# Patient Record
Sex: Male | Born: 1945 | Race: White | Hispanic: No | Marital: Married | Smoking: Never smoker
Health system: Southern US, Community
[De-identification: ages and names within clinical notes are randomized; demographics above are authoritative.]

## PROBLEM LIST (undated history)

## (undated) ENCOUNTER — Emergency Department (HOSPITAL_BASED_OUTPATIENT_CLINIC_OR_DEPARTMENT_OTHER): Admission: EM | Payer: Medicare Other

## (undated) DIAGNOSIS — R011 Cardiac murmur, unspecified: Secondary | ICD-10-CM

## (undated) DIAGNOSIS — I10 Essential (primary) hypertension: Secondary | ICD-10-CM

## (undated) DIAGNOSIS — M1990 Unspecified osteoarthritis, unspecified site: Secondary | ICD-10-CM

## (undated) DIAGNOSIS — T7840XA Allergy, unspecified, initial encounter: Secondary | ICD-10-CM

## (undated) DIAGNOSIS — Z5189 Encounter for other specified aftercare: Secondary | ICD-10-CM

## (undated) DIAGNOSIS — E785 Hyperlipidemia, unspecified: Secondary | ICD-10-CM

## (undated) DIAGNOSIS — J302 Other seasonal allergic rhinitis: Secondary | ICD-10-CM

## (undated) DIAGNOSIS — K56609 Unspecified intestinal obstruction, unspecified as to partial versus complete obstruction: Secondary | ICD-10-CM

## (undated) DIAGNOSIS — D696 Thrombocytopenia, unspecified: Secondary | ICD-10-CM

## (undated) DIAGNOSIS — K469 Unspecified abdominal hernia without obstruction or gangrene: Secondary | ICD-10-CM

## (undated) DIAGNOSIS — I77810 Thoracic aortic ectasia: Secondary | ICD-10-CM

## (undated) DIAGNOSIS — C801 Malignant (primary) neoplasm, unspecified: Secondary | ICD-10-CM

## (undated) DIAGNOSIS — G629 Polyneuropathy, unspecified: Secondary | ICD-10-CM

## (undated) DIAGNOSIS — D649 Anemia, unspecified: Secondary | ICD-10-CM

## (undated) DIAGNOSIS — N182 Chronic kidney disease, stage 2 (mild): Secondary | ICD-10-CM

## (undated) DIAGNOSIS — K567 Ileus, unspecified: Secondary | ICD-10-CM

## (undated) HISTORY — DX: Anemia, unspecified: D64.9

## (undated) HISTORY — DX: Hyperlipidemia, unspecified: E78.5

## (undated) HISTORY — DX: Encounter for other specified aftercare: Z51.89

## (undated) HISTORY — PX: HERNIA REPAIR: SHX51

## (undated) HISTORY — DX: Allergy, unspecified, initial encounter: T78.40XA

## (undated) HISTORY — PX: COLOSTOMY: SHX63

## (undated) HISTORY — PX: TONSILLECTOMY: SUR1361

---

## 2000-12-31 ENCOUNTER — Encounter: Admission: RE | Admit: 2000-12-31 | Discharge: 2000-12-31 | Payer: Self-pay | Admitting: Nephrology

## 2000-12-31 ENCOUNTER — Encounter: Payer: Self-pay | Admitting: Nephrology

## 2008-05-05 DIAGNOSIS — M1990 Unspecified osteoarthritis, unspecified site: Secondary | ICD-10-CM

## 2008-05-05 HISTORY — DX: Unspecified osteoarthritis, unspecified site: M19.90

## 2012-06-02 DIAGNOSIS — D509 Iron deficiency anemia, unspecified: Secondary | ICD-10-CM | POA: Diagnosis not present

## 2012-06-02 DIAGNOSIS — E7800 Pure hypercholesterolemia, unspecified: Secondary | ICD-10-CM | POA: Diagnosis not present

## 2012-06-02 DIAGNOSIS — I10 Essential (primary) hypertension: Secondary | ICD-10-CM | POA: Diagnosis not present

## 2012-06-02 DIAGNOSIS — N182 Chronic kidney disease, stage 2 (mild): Secondary | ICD-10-CM | POA: Diagnosis not present

## 2012-12-23 DIAGNOSIS — D509 Iron deficiency anemia, unspecified: Secondary | ICD-10-CM | POA: Diagnosis not present

## 2012-12-23 DIAGNOSIS — I10 Essential (primary) hypertension: Secondary | ICD-10-CM | POA: Diagnosis not present

## 2012-12-23 DIAGNOSIS — E7800 Pure hypercholesterolemia, unspecified: Secondary | ICD-10-CM | POA: Diagnosis not present

## 2012-12-23 DIAGNOSIS — N182 Chronic kidney disease, stage 2 (mild): Secondary | ICD-10-CM | POA: Diagnosis not present

## 2012-12-30 DIAGNOSIS — N2581 Secondary hyperparathyroidism of renal origin: Secondary | ICD-10-CM | POA: Diagnosis not present

## 2012-12-30 DIAGNOSIS — I10 Essential (primary) hypertension: Secondary | ICD-10-CM | POA: Diagnosis not present

## 2012-12-30 DIAGNOSIS — D509 Iron deficiency anemia, unspecified: Secondary | ICD-10-CM | POA: Diagnosis not present

## 2013-03-15 DIAGNOSIS — D509 Iron deficiency anemia, unspecified: Secondary | ICD-10-CM | POA: Diagnosis not present

## 2013-03-15 DIAGNOSIS — E785 Hyperlipidemia, unspecified: Secondary | ICD-10-CM | POA: Diagnosis not present

## 2013-03-15 DIAGNOSIS — N2581 Secondary hyperparathyroidism of renal origin: Secondary | ICD-10-CM | POA: Diagnosis not present

## 2013-03-15 DIAGNOSIS — I10 Essential (primary) hypertension: Secondary | ICD-10-CM | POA: Diagnosis not present

## 2013-08-02 DIAGNOSIS — E785 Hyperlipidemia, unspecified: Secondary | ICD-10-CM | POA: Diagnosis not present

## 2013-08-02 DIAGNOSIS — E669 Obesity, unspecified: Secondary | ICD-10-CM | POA: Diagnosis not present

## 2013-08-02 DIAGNOSIS — N2581 Secondary hyperparathyroidism of renal origin: Secondary | ICD-10-CM | POA: Diagnosis not present

## 2013-08-02 DIAGNOSIS — I10 Essential (primary) hypertension: Secondary | ICD-10-CM | POA: Diagnosis not present

## 2013-08-02 DIAGNOSIS — N182 Chronic kidney disease, stage 2 (mild): Secondary | ICD-10-CM | POA: Diagnosis not present

## 2014-01-04 DIAGNOSIS — N182 Chronic kidney disease, stage 2 (mild): Secondary | ICD-10-CM | POA: Diagnosis not present

## 2014-01-04 DIAGNOSIS — E785 Hyperlipidemia, unspecified: Secondary | ICD-10-CM | POA: Diagnosis not present

## 2014-02-24 DIAGNOSIS — N2581 Secondary hyperparathyroidism of renal origin: Secondary | ICD-10-CM | POA: Diagnosis not present

## 2014-02-24 DIAGNOSIS — N182 Chronic kidney disease, stage 2 (mild): Secondary | ICD-10-CM | POA: Diagnosis not present

## 2014-02-28 DIAGNOSIS — E785 Hyperlipidemia, unspecified: Secondary | ICD-10-CM | POA: Diagnosis not present

## 2014-02-28 DIAGNOSIS — I10 Essential (primary) hypertension: Secondary | ICD-10-CM | POA: Diagnosis not present

## 2014-02-28 DIAGNOSIS — N182 Chronic kidney disease, stage 2 (mild): Secondary | ICD-10-CM | POA: Diagnosis not present

## 2014-02-28 DIAGNOSIS — N2581 Secondary hyperparathyroidism of renal origin: Secondary | ICD-10-CM | POA: Diagnosis not present

## 2014-08-02 DIAGNOSIS — E785 Hyperlipidemia, unspecified: Secondary | ICD-10-CM | POA: Diagnosis not present

## 2014-08-02 DIAGNOSIS — N182 Chronic kidney disease, stage 2 (mild): Secondary | ICD-10-CM | POA: Diagnosis not present

## 2014-09-13 DIAGNOSIS — E669 Obesity, unspecified: Secondary | ICD-10-CM | POA: Diagnosis not present

## 2014-09-13 DIAGNOSIS — I10 Essential (primary) hypertension: Secondary | ICD-10-CM | POA: Diagnosis not present

## 2014-09-13 DIAGNOSIS — N182 Chronic kidney disease, stage 2 (mild): Secondary | ICD-10-CM | POA: Diagnosis not present

## 2014-09-13 DIAGNOSIS — E785 Hyperlipidemia, unspecified: Secondary | ICD-10-CM | POA: Diagnosis not present

## 2014-10-18 DIAGNOSIS — E785 Hyperlipidemia, unspecified: Secondary | ICD-10-CM | POA: Diagnosis not present

## 2014-10-18 DIAGNOSIS — N182 Chronic kidney disease, stage 2 (mild): Secondary | ICD-10-CM | POA: Diagnosis not present

## 2015-05-02 DIAGNOSIS — E785 Hyperlipidemia, unspecified: Secondary | ICD-10-CM | POA: Diagnosis not present

## 2015-05-02 DIAGNOSIS — N182 Chronic kidney disease, stage 2 (mild): Secondary | ICD-10-CM | POA: Diagnosis not present

## 2015-06-13 DIAGNOSIS — N182 Chronic kidney disease, stage 2 (mild): Secondary | ICD-10-CM | POA: Diagnosis not present

## 2015-06-13 DIAGNOSIS — I10 Essential (primary) hypertension: Secondary | ICD-10-CM | POA: Diagnosis not present

## 2015-06-13 DIAGNOSIS — N2581 Secondary hyperparathyroidism of renal origin: Secondary | ICD-10-CM | POA: Diagnosis not present

## 2015-06-13 DIAGNOSIS — E669 Obesity, unspecified: Secondary | ICD-10-CM | POA: Diagnosis not present

## 2015-06-13 DIAGNOSIS — E785 Hyperlipidemia, unspecified: Secondary | ICD-10-CM | POA: Diagnosis not present

## 2015-12-27 DIAGNOSIS — N2581 Secondary hyperparathyroidism of renal origin: Secondary | ICD-10-CM | POA: Diagnosis not present

## 2015-12-27 DIAGNOSIS — I10 Essential (primary) hypertension: Secondary | ICD-10-CM | POA: Diagnosis not present

## 2015-12-27 DIAGNOSIS — E785 Hyperlipidemia, unspecified: Secondary | ICD-10-CM | POA: Diagnosis not present

## 2015-12-27 DIAGNOSIS — N182 Chronic kidney disease, stage 2 (mild): Secondary | ICD-10-CM | POA: Diagnosis not present

## 2015-12-27 DIAGNOSIS — E669 Obesity, unspecified: Secondary | ICD-10-CM | POA: Diagnosis not present

## 2016-03-07 DIAGNOSIS — E785 Hyperlipidemia, unspecified: Secondary | ICD-10-CM | POA: Diagnosis not present

## 2016-03-07 DIAGNOSIS — N2581 Secondary hyperparathyroidism of renal origin: Secondary | ICD-10-CM | POA: Diagnosis not present

## 2016-03-07 DIAGNOSIS — R197 Diarrhea, unspecified: Secondary | ICD-10-CM | POA: Diagnosis not present

## 2016-03-07 DIAGNOSIS — I10 Essential (primary) hypertension: Secondary | ICD-10-CM | POA: Diagnosis not present

## 2016-03-07 DIAGNOSIS — N182 Chronic kidney disease, stage 2 (mild): Secondary | ICD-10-CM | POA: Diagnosis not present

## 2016-03-07 DIAGNOSIS — E669 Obesity, unspecified: Secondary | ICD-10-CM | POA: Diagnosis not present

## 2016-03-07 DIAGNOSIS — R112 Nausea with vomiting, unspecified: Secondary | ICD-10-CM | POA: Diagnosis not present

## 2016-03-12 DIAGNOSIS — R112 Nausea with vomiting, unspecified: Secondary | ICD-10-CM | POA: Diagnosis not present

## 2016-03-12 DIAGNOSIS — N182 Chronic kidney disease, stage 2 (mild): Secondary | ICD-10-CM | POA: Diagnosis not present

## 2016-03-31 ENCOUNTER — Ambulatory Visit
Admission: RE | Admit: 2016-03-31 | Discharge: 2016-03-31 | Disposition: A | Discharge status: Home / Self Care | Payer: Medicare Other | Source: Ambulatory Visit | Attending: Nephrology | Admitting: Nephrology

## 2016-03-31 ENCOUNTER — Other Ambulatory Visit: Payer: Self-pay | Admitting: Nephrology

## 2016-03-31 DIAGNOSIS — R112 Nausea with vomiting, unspecified: Secondary | ICD-10-CM

## 2016-03-31 DIAGNOSIS — R197 Diarrhea, unspecified: Secondary | ICD-10-CM | POA: Diagnosis not present

## 2016-03-31 DIAGNOSIS — I10 Essential (primary) hypertension: Secondary | ICD-10-CM | POA: Diagnosis not present

## 2016-03-31 DIAGNOSIS — E785 Hyperlipidemia, unspecified: Secondary | ICD-10-CM | POA: Diagnosis not present

## 2016-03-31 DIAGNOSIS — N183 Chronic kidney disease, stage 3 (moderate): Secondary | ICD-10-CM | POA: Diagnosis not present

## 2016-03-31 DIAGNOSIS — R140 Abdominal distension (gaseous): Secondary | ICD-10-CM

## 2016-03-31 DIAGNOSIS — N179 Acute kidney failure, unspecified: Secondary | ICD-10-CM | POA: Diagnosis not present

## 2016-03-31 DIAGNOSIS — N2581 Secondary hyperparathyroidism of renal origin: Secondary | ICD-10-CM | POA: Diagnosis not present

## 2016-03-31 DIAGNOSIS — E669 Obesity, unspecified: Secondary | ICD-10-CM | POA: Diagnosis not present

## 2016-04-01 DIAGNOSIS — N179 Acute kidney failure, unspecified: Secondary | ICD-10-CM | POA: Diagnosis not present

## 2016-04-02 ENCOUNTER — Encounter (HOSPITAL_COMMUNITY): Payer: Self-pay | Admitting: *Deleted

## 2016-04-02 ENCOUNTER — Inpatient Hospital Stay (HOSPITAL_COMMUNITY)
Admission: EM | Admit: 2016-04-02 | Discharge: 2016-04-15 | DRG: 330 | Disposition: A | Discharge status: Home Health | Payer: Medicare Other | Attending: Internal Medicine | Admitting: Internal Medicine

## 2016-04-02 ENCOUNTER — Emergency Department (HOSPITAL_COMMUNITY): Payer: Medicare Other

## 2016-04-02 DIAGNOSIS — I700 Atherosclerosis of aorta: Secondary | ICD-10-CM | POA: Diagnosis present

## 2016-04-02 DIAGNOSIS — E778 Other disorders of glycoprotein metabolism: Secondary | ICD-10-CM | POA: Diagnosis not present

## 2016-04-02 DIAGNOSIS — N182 Chronic kidney disease, stage 2 (mild): Secondary | ICD-10-CM | POA: Diagnosis present

## 2016-04-02 DIAGNOSIS — Z8719 Personal history of other diseases of the digestive system: Secondary | ICD-10-CM

## 2016-04-02 DIAGNOSIS — R7303 Prediabetes: Secondary | ICD-10-CM | POA: Diagnosis present

## 2016-04-02 DIAGNOSIS — I129 Hypertensive chronic kidney disease with stage 1 through stage 4 chronic kidney disease, or unspecified chronic kidney disease: Secondary | ICD-10-CM | POA: Diagnosis not present

## 2016-04-02 DIAGNOSIS — E872 Acidosis: Secondary | ICD-10-CM | POA: Diagnosis present

## 2016-04-02 DIAGNOSIS — K567 Ileus, unspecified: Secondary | ICD-10-CM | POA: Diagnosis present

## 2016-04-02 DIAGNOSIS — C186 Malignant neoplasm of descending colon: Principal | ICD-10-CM | POA: Diagnosis present

## 2016-04-02 DIAGNOSIS — I10 Essential (primary) hypertension: Secondary | ICD-10-CM | POA: Diagnosis present

## 2016-04-02 DIAGNOSIS — K4090 Unilateral inguinal hernia, without obstruction or gangrene, not specified as recurrent: Secondary | ICD-10-CM | POA: Diagnosis present

## 2016-04-02 DIAGNOSIS — N189 Chronic kidney disease, unspecified: Secondary | ICD-10-CM

## 2016-04-02 DIAGNOSIS — K5909 Other constipation: Secondary | ICD-10-CM | POA: Diagnosis present

## 2016-04-02 DIAGNOSIS — I708 Atherosclerosis of other arteries: Secondary | ICD-10-CM | POA: Diagnosis present

## 2016-04-02 DIAGNOSIS — Z79899 Other long term (current) drug therapy: Secondary | ICD-10-CM

## 2016-04-02 DIAGNOSIS — R7301 Impaired fasting glucose: Secondary | ICD-10-CM | POA: Diagnosis present

## 2016-04-02 DIAGNOSIS — C189 Malignant neoplasm of colon, unspecified: Secondary | ICD-10-CM | POA: Diagnosis present

## 2016-04-02 DIAGNOSIS — K598 Other specified functional intestinal disorders: Secondary | ICD-10-CM | POA: Diagnosis not present

## 2016-04-02 DIAGNOSIS — K529 Noninfective gastroenteritis and colitis, unspecified: Secondary | ICD-10-CM | POA: Diagnosis present

## 2016-04-02 DIAGNOSIS — E876 Hypokalemia: Secondary | ICD-10-CM | POA: Diagnosis present

## 2016-04-02 DIAGNOSIS — N179 Acute kidney failure, unspecified: Secondary | ICD-10-CM | POA: Diagnosis present

## 2016-04-02 DIAGNOSIS — C187 Malignant neoplasm of sigmoid colon: Secondary | ICD-10-CM | POA: Diagnosis present

## 2016-04-02 DIAGNOSIS — K56609 Unspecified intestinal obstruction, unspecified as to partial versus complete obstruction: Secondary | ICD-10-CM

## 2016-04-02 DIAGNOSIS — Z8379 Family history of other diseases of the digestive system: Secondary | ICD-10-CM

## 2016-04-02 DIAGNOSIS — K640 First degree hemorrhoids: Secondary | ICD-10-CM | POA: Diagnosis present

## 2016-04-02 HISTORY — DX: Essential (primary) hypertension: I10

## 2016-04-02 HISTORY — DX: Chronic kidney disease, stage 2 (mild): N18.2

## 2016-04-02 HISTORY — DX: Unspecified abdominal hernia without obstruction or gangrene: K46.9

## 2016-04-02 LAB — COMPREHENSIVE METABOLIC PANEL
ALBUMIN: 3.4 g/dL — AB (ref 3.5–5.0)
ALK PHOS: 66 U/L (ref 38–126)
ALT: 13 U/L — AB (ref 17–63)
AST: 13 U/L — ABNORMAL LOW (ref 15–41)
Anion gap: 14 (ref 5–15)
BILIRUBIN TOTAL: 0.6 mg/dL (ref 0.3–1.2)
BUN: 62 mg/dL — AB (ref 6–20)
CALCIUM: 9 mg/dL (ref 8.9–10.3)
CO2: 19 mmol/L — ABNORMAL LOW (ref 22–32)
CREATININE: 3.5 mg/dL — AB (ref 0.61–1.24)
Chloride: 103 mmol/L (ref 101–111)
GFR calc Af Amer: 19 mL/min — ABNORMAL LOW (ref >60)
GFR calc non Af Amer: 16 mL/min — ABNORMAL LOW (ref >60)
GLUCOSE: 157 mg/dL — AB (ref 65–99)
POTASSIUM: 4.1 mmol/L (ref 3.5–5.1)
Sodium: 136 mmol/L (ref 135–145)
TOTAL PROTEIN: 7.2 g/dL (ref 6.5–8.1)

## 2016-04-02 LAB — URINALYSIS, ROUTINE W REFLEX MICROSCOPIC
BILIRUBIN URINE: NEGATIVE
Glucose, UA: NEGATIVE mg/dL
Hgb urine dipstick: NEGATIVE
KETONES UR: NEGATIVE mg/dL
NITRITE: NEGATIVE
PH: 5 (ref 5.0–8.0)
Protein, ur: 30 mg/dL — AB
Specific Gravity, Urine: 1.017 (ref 1.005–1.030)

## 2016-04-02 LAB — URINE MICROSCOPIC-ADD ON: RBC / HPF: NONE SEEN RBC/hpf (ref 0–5)

## 2016-04-02 LAB — PHOSPHORUS: PHOSPHORUS: 4.4 mg/dL (ref 2.5–4.6)

## 2016-04-02 LAB — CBC
HEMATOCRIT: 40.6 % (ref 39.0–52.0)
Hemoglobin: 13.8 g/dL (ref 13.0–17.0)
MCH: 28.5 pg (ref 26.0–34.0)
MCHC: 34 g/dL (ref 30.0–36.0)
MCV: 83.9 fL (ref 78.0–100.0)
Platelets: 230 10*3/uL (ref 150–400)
RBC: 4.84 MIL/uL (ref 4.22–5.81)
RDW: 15.1 % (ref 11.5–15.5)
WBC: 5.1 10*3/uL (ref 4.0–10.5)

## 2016-04-02 LAB — LACTIC ACID, PLASMA: LACTIC ACID, VENOUS: 1.2 mmol/L (ref 0.5–1.9)

## 2016-04-02 LAB — LIPASE, BLOOD: Lipase: 19 U/L (ref 11–51)

## 2016-04-02 LAB — MAGNESIUM: MAGNESIUM: 1.9 mg/dL (ref 1.7–2.4)

## 2016-04-02 MED ORDER — MORPHINE SULFATE (PF) 4 MG/ML IV SOLN: 4.0000 mg | Freq: Once | INTRAVENOUS | Status: DC

## 2016-04-02 MED ORDER — ONDANSETRON HCL 4 MG/2ML IJ SOLN
4.0000 mg | Freq: Four times a day (QID) | INTRAMUSCULAR | Status: DC | PRN
  Filled 2016-04-02 (×3): qty 2

## 2016-04-02 MED ORDER — MORPHINE SULFATE (PF) 4 MG/ML IV SOLN
4.0000 mg | Freq: Once | INTRAVENOUS | Status: AC
Start: 2016-04-02 — End: 2016-04-02
  Administered 2016-04-02: 4 mg via INTRAVENOUS
  Filled 2016-04-02: qty 1

## 2016-04-02 MED ORDER — SODIUM CHLORIDE 0.9 % IV SOLN
INTRAVENOUS | Status: DC
  Administered 2016-04-02 – 2016-04-03 (×3): via INTRAVENOUS

## 2016-04-02 MED ORDER — ONDANSETRON HCL 4 MG/2ML IJ SOLN
4.0000 mg | Freq: Once | INTRAMUSCULAR | Status: AC
  Administered 2016-04-02: 4 mg via INTRAVENOUS
  Filled 2016-04-02: qty 2

## 2016-04-02 MED ORDER — ONDANSETRON HCL 4 MG PO TABS: 4.0000 mg | ORAL_TABLET | Freq: Four times a day (QID) | ORAL | Status: DC | PRN

## 2016-04-02 MED ORDER — ENOXAPARIN SODIUM 30 MG/0.3ML ~~LOC~~ SOLN
30.0000 mg | SUBCUTANEOUS | Status: DC
Start: 2016-04-02 — End: 2016-04-02

## 2016-04-02 MED ORDER — SODIUM CHLORIDE 0.9 % IV BOLUS (SEPSIS)
1000.0000 mL | Freq: Once | INTRAVENOUS | Status: AC
  Administered 2016-04-02: 1000 mL via INTRAVENOUS

## 2016-04-02 MED ORDER — MORPHINE SULFATE (PF) 4 MG/ML IV SOLN
2.0000 mg | INTRAVENOUS | Status: DC | PRN
  Administered 2016-04-02: 2 mg via INTRAVENOUS
  Filled 2016-04-02: qty 1

## 2016-04-02 NOTE — H&P (Signed)
Date: 04/02/2016               Patient Name:  Louis [name] MRN: [id number]  DOB: 25-Dec-1945 Age / Sex: 70 y.o., male   PCP: No primary care provider on file.         Medical Service: Internal Medicine Teaching Service         Attending Physician: Dr. Axel Filler, MD    First Contact: Dr. Inda Castle Pager: [phone]  Second Contact: Dr. Benjamine Mola Pager: [phone]       After Hours (After 5p/  First Contact Pager: [phone]  weekends / holidays): Second Contact Pager: [phone]   Chief Complaint: Nausea/vomiting, abdominal pain  History of Present Illness: 70 year old man with hypertension, CKD2, inguinal hernia presenting with abdominal pain for the last 5 days. 4 weeks ago, he went to a festival and had some fries. A week later he developed, nausea, vomiting, abdominal pain and diarrhea. He was seen by his nephrologist. He was prescribed a 5 day course of antibiotics. A week later he had another episode. This episode started this past weekend with nausea and vomiting. He vomited 2 times in one day. Nonbloody and nonbilious. He developed abdominal pain and diarrhea soon after. He had 2-3 BM on Sunday - watery without hematochezia or melena. No BM since then. Denies flatus. Abdominal pain is most in the epigastric region. He has intermittent episodes of bloating with the pain. Pain does not radiates. Pain is cramping in nature. Sitting up relieves the pain. Morphine also helps. No exacerbating factors. He is having a lot of burping. No postprandial meals and no correlation with time of day. No sick contacts. No fevers or chills. No previous similar episodes. He had an inguinal hernia repair as a child. 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 He reports the hernia is easily reducible. No recent antibiotics before course he was given by his nephrologist.   He has not had a colonscopy and has never had an EGD.   No vision  changes, cough, shortness of breath, chest pain, dysuria or hematuria. No edema. No new arthralgias, myalgias.  Meds:  Current Meds  Medication Sig  . labetalol (NORMODYNE) 300 MG tablet Take 300 mg by mouth 2 (two) times daily.  Marland Kitchen loperamide (IMODIUM) 2 MG capsule Take 2-4 mg by mouth daily as needed.  Marland Kitchen omega-3 acid ethyl esters (LOVAZA) 1 g capsule Take 1 capsule by mouth daily.  . promethazine (PHENERGAN) 25 MG tablet Take 25 mg by mouth every 6 (six) hours as needed for nausea or vomiting.      Allergies: Allergies as of 04/02/2016  . (No Known Allergies)   Past Medical History:  Diagnosis Date  . Hernia, abdominal   . Hypertension     Family History:  Brother with diverticulosis. Father had PUD. No family history of colon cancer.  Social History:  Quit cigars 30 years ago. Occasional alcohol. A few glasses of wine a few times a week. No illicits. 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  Review of Systems: A complete ROS was negative except as per HPI.   Physical Exam: Blood pressure 130/82, pulse 92, temperature 98.4 F (36.9 C), temperature source Oral, resp. rate 21, height 6\' 5"  (1.956 m), weight 234 lb (106.1 kg), SpO2 96 %. General Apperance: NAD Head: Normocephalic, atraumatic Eyes: PERRL, EOMI, anicteric sclera Ears: Normal external ear canal Nose: Nares  normal, septum midline, mucosa normal Throat: Lips, mucosa and tongue normal  Neck: Supple, trachea midline Back: No tenderness or bony abnormality  Lungs: Clear to auscultation bilaterally. No wheezes, rhonchi or rales. Breathing comfortably Chest Wall: Nontender, no deformity Heart: Regular rate and rhythm, no murmur/rub/gallop Abdomen: Soft, minimally tender to palpation throughout, distended and tympanic to percussion, no rebound/guarding Extremities: Normal, atraumatic, warm and well perfused, no edema Pulses: 2+ throughout Skin: No rashes or lesions Neurologic: Alert and oriented x 3. CNII-XII intact.  Normal strength and sensation   AXR 11/27: Mildly prominent loop of small bowel in the left mid abdomen with possible valvular/wall thickening.  CT abdomen/pelvis wo contrast:  Multiple loops of dilated small and large bowel with fluid throughout much of the bowel. A well-defined transition zone is not seen. There is no appreciable bowel wall thickening. No bowel pneumatosis evident. The appearance is consistent with ileus, likely with associated enterocolitis.  There is a right inguinal hernia which contains a portion of the urinary bladder. There is wall thickening in much of the urinary bladder suggesting a degree of cystitis.  Prostate appears prominent and cannot be separated from the inferior bladder. This finding warrants direct physical examination and correlation with PSA. Prostate carcinoma in this circumstance cannot be excluded by CT.  There is aortoiliac atherosclerosis. There are foci of coronary artery calcification.  Gallbladder appears somewhat distended without wall thickening.  Assessment & Plan by Problem: 70 year old man with hypertension, CKD2, inguinal hernia presenting with abdominal pain for the last 5 days.   Ileus: He is afebrile with no leukocytosis. CT abd/pelvis today with multiple loops of dilated small and large bowel with no well defined transition point. Some wall thickening of the urinary bladder but UA with negative nitrite, trace leukocytes, 0-5 WBC, 6-30 squamous epithelial cells. Unclear etiology of his ileus. He received 1L NS bolus,  -check magnesium and phos -NPO -Normal saline @ 135ml/hr -morphine 2mg  q 4hr prn pain -Zofran 4mg  q6hr prn nausea/vomiting  Metabolic acidosis: CO2 19 and anion gap of 14. No previous labs for comparison. This may be related to his renal disease. Will check a lactic acid.  AKI on CKD2: Unknown baseline. BUN 62 and Cr 3.5 on admission. Likely pre-renal in etiology due to nausea/vomiting and poor PO  intake.  Hypertension: Hold home labetalol and amlodipine for now.  FEN: NPO, NS@150ml /hr VTE ppx: Lovenox Code status: FULL  Dispo: Admit patient to Observation with expected length of stay less than 2 midnights.  Signed: Milagros Loll, MD 04/02/2016, 11:41 AM  Pager: [phone]

## 2016-04-02 NOTE — ED Triage Notes (Signed)
Pt reports abd cramping, n/v/d. Had similar episode two weeks ago.

## 2016-04-02 NOTE — ED Notes (Signed)
Pt given ice chips per [place], Therapist, sports.

## 2016-04-02 NOTE — ED Provider Notes (Signed)
[facility] DEPT Provider Note   CSN: [id number] Arrival date & time: 04/02/16  1740     History   Chief Complaint Chief Complaint  Patient presents with  . Abdominal Pain  . Emesis  . Diarrhea    HPI Louis [name] is a 70 y.o. male.  HPI   Patient is a 70 year old male with history of hypertension, CKD 2/2 HTN and abdominal hernia who presents the ED with complaint of abdominal pain, onset 5 days. Patient reports having intermittent diffuse abdominal cramping/tightness. Denies any aggravating or alleviating factors. He reports having associated nausea and multiple episodes of NBNB vomiting and nonbloody diarrhea over the weekend which has since resolved; however patient endorses having continued abdominal pain. Endorses associated abdominal distention, decreased flatulence, constipation( last BM 3 days ago) and chills. He reports having similar episode of symptoms 2-3 weeks ago. He states he was evaluated by his primary care provider (nephrologist), had an abdominal xray performed which was negative and was prescribed antibiotics and states his symptoms had completely resolved until he returned this past week. He states he was seen by his PCP 2 days ago and was given referral to GI for further evaluation. Patient reports his abdominal pain worsened resulting in him coming to the ED. Reports he has not been eating much and has had decreased fluid intake due to not feeling well. Denies fever, body aches, cough, shortness of breath, chest pain, urinary symptoms. Denies any known sick contacts. Denies any recent hospitalizations. Reports history of hernia repair when he was 70 years old.  Nephrologist- Dr. Marval Regal  Chart review shows KUB performed on 11/17 with "Mildly prominent loop of small bowel in the left mid abdomen with possible valvular/wall thickening".  Past Medical History:  Diagnosis Date  . Hernia, abdominal   . Hypertension     Patient Active Problem List   Diagnosis Date Noted  . Ileus ([place]) 04/02/2016    History reviewed. No pertinent surgical history.     Home Medications    Prior to Admission medications   Medication Sig Start Date End Date Taking? Authorizing Provider  labetalol (NORMODYNE) 300 MG tablet Take 300 mg by mouth 2 (two) times daily. 03/02/16  Yes Historical Provider, MD  loperamide (IMODIUM) 2 MG capsule Take 2-4 mg by mouth daily as needed. 03/07/16  Yes Historical Provider, MD  omega-3 acid ethyl esters (LOVAZA) 1 g capsule Take 1 capsule by mouth daily. 03/22/16  Yes Historical Provider, MD  promethazine (PHENERGAN) 25 MG tablet Take 25 mg by mouth every 6 (six) hours as needed for nausea or vomiting.  03/31/16  Yes Historical Provider, MD  amLODipine (NORVASC) 10 MG tablet Take 10 mg by mouth daily. 02/11/16   Historical Provider, MD    Family History History reviewed. No pertinent family history.  Social History Social History  Substance Use Topics  . Smoking status: Never Smoker  . Smokeless tobacco: Not on file  . Alcohol use Yes     Comment: occ     Allergies   Patient has no known allergies.   Review of Systems Review of Systems  Constitutional: Positive for chills.  Gastrointestinal: Positive for abdominal distention, abdominal pain, diarrhea, nausea and vomiting.  All other systems reviewed and are negative.    Physical Exam Updated Vital Signs BP 130/82   Pulse 92   Temp 98.4 F (36.9 C) (Oral)   Resp 21   Ht 6\' 5"  (1.956 m)   Wt 106.1 kg   SpO2 96%  BMI 27.75 kg/m   Physical Exam  Constitutional: He is oriented to person, place, and time. He appears well-developed and well-nourished.  HENT:  Head: Normocephalic and atraumatic.  Mouth/Throat: Uvula is midline, oropharynx is clear and moist and mucous membranes are normal. No oropharyngeal exudate, posterior oropharyngeal edema, posterior oropharyngeal erythema or tonsillar abscesses. No tonsillar exudate.  Eyes: Conjunctivae and  EOM are normal. Right eye exhibits no discharge. Left eye exhibits no discharge. No scleral icterus.  Neck: Normal range of motion. Neck supple.  Cardiovascular: Normal rate, regular rhythm, normal heart sounds and intact distal pulses.   Pulmonary/Chest: Effort normal and breath sounds normal. No respiratory distress. He has no wheezes. He has no rales. He exhibits no tenderness.  Abdominal: Soft. Bowel sounds are normal. He exhibits distension. He exhibits no mass. There is tenderness (mild diffuse tenderness throughout). There is no rebound and no guarding. No hernia.  No CVA tenderness  Musculoskeletal: Normal range of motion. He exhibits no edema.  Neurological: He is alert and oriented to person, place, and time.  Skin: Skin is warm and dry.  Nursing note and vitals reviewed.    ED Treatments / Results  Labs (all labs ordered are listed, but only abnormal results are displayed) Labs Reviewed  COMPREHENSIVE METABOLIC PANEL - Abnormal; Notable for the following:       Result Value   CO2 19 (*)    Glucose, Bld 157 (*)    BUN 62 (*)    Creatinine, Ser 3.50 (*)    Albumin 3.4 (*)    AST 13 (*)    ALT 13 (*)    GFR calc non Af Amer 16 (*)    GFR calc Af Amer 19 (*)    All other components within normal limits  URINALYSIS, ROUTINE W REFLEX MICROSCOPIC (NOT AT [facility]) - Abnormal; Notable for the following:    Protein, ur 30 (*)    Leukocytes, UA TRACE (*)    All other components within normal limits  URINE MICROSCOPIC-ADD ON - Abnormal; Notable for the following:    Squamous Epithelial / LPF 6-30 (*)    Bacteria, UA FEW (*)    Casts GRANULAR CAST (*)    All other components within normal limits  LIPASE, BLOOD  CBC    EKG  EKG Interpretation None       Radiology Ct Abdomen Pelvis Wo Contrast  Result Date: 04/02/2016 CLINICAL DATA:  Two week history of abdominal distention with nausea, vomiting common diarrhea EXAM: CT ABDOMEN AND PELVIS WITHOUT CONTRAST TECHNIQUE:  Multidetector CT imaging of the abdomen and pelvis was performed following the standard protocol without IV contrast. Oral contrast was administered. COMPARISON:  None. FINDINGS: Lower chest: There is patchy bibasilar atelectasis. There are scattered foci of coronary artery calcification. Hepatobiliary: No focal liver lesions are evident on this non intravenous contrast enhanced study. The gallbladder appears distended without appreciable gallbladder wall thickening. There is no biliary duct dilatation. Pancreas: There is no pancreatic mass or inflammatory focus. Spleen: No splenic lesions are evident. Adrenals/Urinary Tract: Adrenals appear normal bilaterally. There is a cyst arising from the lower pole of the left kidney measuring 7.0 x 6.1 cm. There is a cyst arising from the posterior mid right kidney measuring 2.3 x 2.2 cm. There is an immediately adjacent 1 x 1 cm cyst in the right kidney in this region. There is no renal hydronephrosis on either side. There is no renal or ureteral calculus on either side. Urinary bladder is midline  with a portion of the urinary bladder extending into a right inguinal hernia. Portions of the urinary bladder have a thickened wall. Stomach/Bowel: The stomach is nondistended. The proximal small bowel is nondistended. There is dilatation of small bowel from the level of the proximal to mid jejunum throughout its course. There is distention of most of the colon. There is a significant amount of fluid throughout the colon as well as much of the distal small bowel. A well-defined transition zone suggesting a focal site of small or large bowel obstruction is not appreciated. There is no bowel wall or mesenteric thickening. There is no free air or portal venous air. Vascular/Lymphatic: There is atherosclerotic calcification in the aorta and common iliac arteries. There is no demonstrable abdominal aortic aneurysm. Major mesenteric arterial vessels appear patent on this noncontrast  enhanced study. There is no appreciable adenopathy in the abdomen or pelvis. Reproductive: The prostate appears mildly enlarged and is difficult to separate from the inferior urinary bladder. There are several prostatic calculi. The seminal vesicles appear unremarkable. No pelvic mass. Other: Appendix appears unremarkable without inflammation. No abscess or ascites is evident in the abdomen or pelvis. There is a right inguinal hernia which contains a portion of a prolapsed urinary bladder with wall thickening in the bladder. There is fat in the left inguinal ring. There is a minimal ventral hernia containing only fat. Musculoskeletal: There is degenerative change in the lumbar spine. There is moderate spinal stenosis at L3-4 due to bony hypertrophy and diffuse disc protrusion. There are no blastic or lytic bone lesions. There is no intramuscular or abdominal wall lesion. IMPRESSION: Multiple loops of dilated small and large bowel with fluid throughout much of the bowel. A well-defined transition zone is not seen. There is no appreciable bowel wall thickening. No bowel pneumatosis evident. The appearance is consistent with ileus, likely with associated enterocolitis. There is a right inguinal hernia which contains a portion of the urinary bladder. There is wall thickening in much of the urinary bladder suggesting a degree of cystitis. Prostate appears prominent and cannot be separated from the inferior bladder. This finding warrants direct physical examination and correlation with PSA. Prostate carcinoma in this circumstance cannot be excluded by CT. There is aortoiliac atherosclerosis. There are foci of coronary artery calcification. Gallbladder appears somewhat distended without wall thickening. Renal cysts bilaterally. Largest cyst arises from the left kidney measuring 7.0 x 6.1 cm. 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 Electronically Signed   By: Lowella Grip III M.D.   On: 04/02/2016 11:08    Dg Abd 1 View  Result Date: 03/31/2016 CLINICAL DATA:  Diarrhea, nausea, and vomiting. Distended abdomen with pain for 2 weeks. EXAM: ABDOMEN - 1 VIEW COMPARISON:  None. FINDINGS: Air-filled loop of bowel in the mid abdomen is thought to be colon with a small amount of air in the ascending colon and rectum. Mildly prominent loops of small bowel in the left abdomen with suggested valvular thickening, measuring up to 3.2 cm in caliber. Phleboliths in the pelvis. No free air, portal venous gas, or pneumatosis. IMPRESSION: Mildly prominent loop of small bowel in the left mid abdomen with possible valvular/wall thickening. A CT scan could better evaluate as clinically warranted. These results will be called to the ordering clinician or representative by the Radiologist Assistant, and communication documented in the PACS or zVision Dashboard. Electronically Signed   By: Dorise Bullion III M.D   On: 03/31/2016 12:12    Procedures Procedures (including critical care time)  Medications Ordered in ED Medications  morphine 4 MG/ML injection 4 mg (not administered)  sodium chloride 0.9 % bolus 1,000 mL (0 mLs Intravenous Stopped 04/02/16 0944)  morphine 4 MG/ML injection 4 mg (4 mg Intravenous Given 04/02/16 0819)  ondansetron (ZOFRAN) injection 4 mg (4 mg Intravenous Given 04/02/16 0818)     Initial Impression / Assessment and Plan / ED Course  I have reviewed the triage vital signs and the nursing notes.  Pertinent labs & imaging results that were available during my care of the patient were reviewed by me and considered in my medical decision making (see chart for details).  Clinical Course     Patient presents with intermittent abdominal pain for the past 2-3 weeks with associated nausea, vomiting and diarrhea. He reports initially been seen by his nephrologist (who he states is his PCP) 2 weeks ago and was given antibiotics. Denies fever. VSS. Exam revealed mild diffuse abdominal tenderness,  no peritoneal signs. Remaining exam unremarkable. Patient given IV fluids, pain meds and antiemetics.   Labs revealed creatinine 3.5, BUN 62. Remaining labs unremarkable. I spoke with nurse at pt's nephrology clinic who faxed me pt's records from recent visit on 11/27. Labs performed on 11/27 showed Cr 3.32, BUN 45. CT abdomen revealed ileus. Due to patient with continued symptoms with outpatient treatment, plan to admit patient for further management. Discussed results and plan for admission with patient and family. Consult at hospitalist. Orders placed for obs med-surg bed.  Final Clinical Impressions(s) / ED Diagnoses   Final diagnoses:  Ileus [facility])    New Prescriptions New Prescriptions   No medications on file     Nona Dell, [place] 04/02/16 1139    Isla Pence, MD 04/02/16 1208

## 2016-04-02 NOTE — ED Notes (Signed)
Report attempted 

## 2016-04-02 NOTE — ED Notes (Signed)
Pt. Denies mjorphine ordered PA aware. Will continue to monitor.

## 2016-04-02 NOTE — ED Notes (Signed)
Pt transporting to CT  

## 2016-04-02 NOTE — ED Notes (Signed)
Pt had unhook o2 sensor on accident. This nurse placed pt back on O2 monitor. O2 sats 94% on room air. Pt continues to deny need for IV morphine that PA ordered. Will continue to monitor.

## 2016-04-02 NOTE — ED Notes (Signed)
Pt ambulated to RR .  

## 2016-04-02 NOTE — ED Notes (Signed)
Patient transported to CT 

## 2016-04-03 DIAGNOSIS — E872 Acidosis: Secondary | ICD-10-CM | POA: Diagnosis not present

## 2016-04-03 DIAGNOSIS — C786 Secondary malignant neoplasm of retroperitoneum and peritoneum: Secondary | ICD-10-CM | POA: Diagnosis not present

## 2016-04-03 DIAGNOSIS — K567 Ileus, unspecified: Secondary | ICD-10-CM | POA: Diagnosis not present

## 2016-04-03 DIAGNOSIS — K56699 Other intestinal obstruction unspecified as to partial versus complete obstruction: Secondary | ICD-10-CM | POA: Diagnosis not present

## 2016-04-03 DIAGNOSIS — R140 Abdominal distension (gaseous): Secondary | ICD-10-CM | POA: Diagnosis not present

## 2016-04-03 DIAGNOSIS — K56600 Partial intestinal obstruction, unspecified as to cause: Secondary | ICD-10-CM | POA: Diagnosis not present

## 2016-04-03 DIAGNOSIS — C186 Malignant neoplasm of descending colon: Secondary | ICD-10-CM | POA: Diagnosis not present

## 2016-04-03 DIAGNOSIS — K5901 Slow transit constipation: Secondary | ICD-10-CM | POA: Diagnosis not present

## 2016-04-03 DIAGNOSIS — C772 Secondary and unspecified malignant neoplasm of intra-abdominal lymph nodes: Secondary | ICD-10-CM | POA: Diagnosis not present

## 2016-04-03 DIAGNOSIS — R933 Abnormal findings on diagnostic imaging of other parts of digestive tract: Secondary | ICD-10-CM | POA: Diagnosis not present

## 2016-04-03 DIAGNOSIS — N179 Acute kidney failure, unspecified: Secondary | ICD-10-CM | POA: Diagnosis not present

## 2016-04-03 DIAGNOSIS — K640 First degree hemorrhoids: Secondary | ICD-10-CM | POA: Diagnosis not present

## 2016-04-03 DIAGNOSIS — N182 Chronic kidney disease, stage 2 (mild): Secondary | ICD-10-CM | POA: Diagnosis not present

## 2016-04-03 DIAGNOSIS — R7303 Prediabetes: Secondary | ICD-10-CM | POA: Diagnosis present

## 2016-04-03 DIAGNOSIS — C187 Malignant neoplasm of sigmoid colon: Secondary | ICD-10-CM | POA: Diagnosis not present

## 2016-04-03 DIAGNOSIS — Z79899 Other long term (current) drug therapy: Secondary | ICD-10-CM | POA: Diagnosis not present

## 2016-04-03 DIAGNOSIS — I700 Atherosclerosis of aorta: Secondary | ICD-10-CM | POA: Diagnosis not present

## 2016-04-03 DIAGNOSIS — I129 Hypertensive chronic kidney disease with stage 1 through stage 4 chronic kidney disease, or unspecified chronic kidney disease: Secondary | ICD-10-CM | POA: Diagnosis not present

## 2016-04-03 DIAGNOSIS — K56609 Unspecified intestinal obstruction, unspecified as to partial versus complete obstruction: Secondary | ICD-10-CM | POA: Diagnosis not present

## 2016-04-03 DIAGNOSIS — E876 Hypokalemia: Secondary | ICD-10-CM | POA: Diagnosis not present

## 2016-04-03 DIAGNOSIS — I10 Essential (primary) hypertension: Secondary | ICD-10-CM | POA: Diagnosis not present

## 2016-04-03 DIAGNOSIS — K439 Ventral hernia without obstruction or gangrene: Secondary | ICD-10-CM | POA: Diagnosis not present

## 2016-04-03 DIAGNOSIS — K5909 Other constipation: Secondary | ICD-10-CM | POA: Diagnosis not present

## 2016-04-03 DIAGNOSIS — C189 Malignant neoplasm of colon, unspecified: Secondary | ICD-10-CM | POA: Diagnosis not present

## 2016-04-03 DIAGNOSIS — E778 Other disorders of glycoprotein metabolism: Secondary | ICD-10-CM | POA: Diagnosis not present

## 2016-04-03 DIAGNOSIS — R935 Abnormal findings on diagnostic imaging of other abdominal regions, including retroperitoneum: Secondary | ICD-10-CM | POA: Diagnosis not present

## 2016-04-03 DIAGNOSIS — K4090 Unilateral inguinal hernia, without obstruction or gangrene, not specified as recurrent: Secondary | ICD-10-CM | POA: Diagnosis not present

## 2016-04-03 DIAGNOSIS — N189 Chronic kidney disease, unspecified: Secondary | ICD-10-CM | POA: Diagnosis not present

## 2016-04-03 DIAGNOSIS — I708 Atherosclerosis of other arteries: Secondary | ICD-10-CM | POA: Diagnosis present

## 2016-04-03 DIAGNOSIS — D490 Neoplasm of unspecified behavior of digestive system: Secondary | ICD-10-CM | POA: Diagnosis not present

## 2016-04-03 LAB — BASIC METABOLIC PANEL
ANION GAP: 9 (ref 5–15)
BUN: 47 mg/dL — AB (ref 6–20)
CALCIUM: 8.5 mg/dL — AB (ref 8.9–10.3)
CO2: 20 mmol/L — ABNORMAL LOW (ref 22–32)
Chloride: 109 mmol/L (ref 101–111)
Creatinine, Ser: 2.59 mg/dL — ABNORMAL HIGH (ref 0.61–1.24)
GFR calc Af Amer: 27 mL/min — ABNORMAL LOW (ref >60)
GFR, EST NON AFRICAN AMERICAN: 24 mL/min — AB (ref >60)
Glucose, Bld: 106 mg/dL — ABNORMAL HIGH (ref 65–99)
POTASSIUM: 3.8 mmol/L (ref 3.5–5.1)
SODIUM: 138 mmol/L (ref 135–145)

## 2016-04-03 LAB — CREATININE, SERUM
CREATININE: 2.46 mg/dL — AB (ref 0.61–1.24)
GFR calc Af Amer: 29 mL/min — ABNORMAL LOW (ref >60)
GFR calc non Af Amer: 25 mL/min — ABNORMAL LOW (ref >60)

## 2016-04-03 LAB — CBC
HCT: 36.1 % — ABNORMAL LOW (ref 39.0–52.0)
Hemoglobin: 11.8 g/dL — ABNORMAL LOW (ref 13.0–17.0)
MCH: 27.9 pg (ref 26.0–34.0)
MCHC: 32.7 g/dL (ref 30.0–36.0)
MCV: 85.3 fL (ref 78.0–100.0)
PLATELETS: 189 10*3/uL (ref 150–400)
RBC: 4.23 MIL/uL (ref 4.22–5.81)
RDW: 15.2 % (ref 11.5–15.5)
WBC: 5.3 10*3/uL (ref 4.0–10.5)

## 2016-04-03 LAB — PHOSPHORUS: Phosphorus: 2.8 mg/dL (ref 2.5–4.6)

## 2016-04-03 LAB — MAGNESIUM: MAGNESIUM: 2 mg/dL (ref 1.7–2.4)

## 2016-04-03 MED ORDER — ENOXAPARIN SODIUM 40 MG/0.4ML ~~LOC~~ SOLN
40.0000 mg | SUBCUTANEOUS | Status: DC
  Administered 2016-04-03 – 2016-04-06 (×4): 40 mg via SUBCUTANEOUS
  Filled 2016-04-03 (×5): qty 0.4

## 2016-04-03 MED ORDER — DEXTROSE-NACL 5-0.45 % IV SOLN
INTRAVENOUS | Status: DC
  Administered 2016-04-03 – 2016-04-07 (×11): via INTRAVENOUS

## 2016-04-03 NOTE — Progress Notes (Signed)
   Subjective: 4-5 liquid bowel movements overnight, no flatus.  No nausea, moderate appetite, would like to try and eat.  Objective:  Vital signs in last 24 hours: Vitals:   04/02/16 1720 04/02/16 2134 04/03/16 0427 04/03/16 1305  BP:  (!) 128/55 (!) 145/77 (!) 155/86  Pulse:  84 91 84  Resp:  19 18 18   Temp:  98.3 F (36.8 C) 98.6 F (37 C) 98.4 F (36.9 C)  TempSrc:  Oral Axillary Oral  SpO2:  97% 95% 99%  Weight: 234 lb (106.1 kg)     Height: 6\' 5"  (1.956 m)      Physical Exam  Constitutional: He is oriented to person, place, and time. He appears well-developed and well-nourished. No distress.  Cardiovascular: Normal rate and regular rhythm.   Pulmonary/Chest: Effort normal and breath sounds normal.  Abdominal:  Tympanic, grossly distended, hyperactive bowel sounds Nontender  Genitourinary:  Genitourinary Comments: Prostate slightly enlarged, homogenous, nontender Scant brown liquid stool in rectum  Neurological: He is alert and oriented to person, place, and time.  Psychiatric: He has a normal mood and affect. His behavior is normal.   CBC Latest Ref Rng & Units 04/02/2016  WBC 4.0 - 10.5 K/uL 5.1  Hemoglobin 13.0 - 17.0 g/dL 13.8  Hematocrit 39.0 - 52.0 % 40.6  Platelets 150 - 400 K/uL 230   BMP Latest Ref Rng & Units 04/03/2016 04/02/2016  Glucose 65 - 99 mg/dL 106(H) 157(H)  BUN 6 - 20 mg/dL 47(H) 62(H)  Creatinine 0.61 - 1.24 mg/dL 2.59(H) 3.50(H)  Sodium 135 - 145 mmol/L 138 136  Potassium 3.5 - 5.1 mmol/L 3.8 4.1  Chloride 101 - 111 mmol/L 109 103  CO2 22 - 32 mmol/L 20(L) 19(L)  Calcium 8.9 - 10.3 mg/dL 8.5(L) 9.0   Lactic Acid, Venous    Component Value Date/Time   LATICACIDVEN 1.2 04/02/2016 2570   70 year old man with distended, tympanic abdomen and imaging finding consistent with ileus.  He had symptoms consistent gastroenteritis which preceded his ileus and may have been the inciting cause.  Currently pursuing conservative  management.  Assessment/Plan:  Active Problems:   Ileus ([facility])  #Ileus -Trial clear liquid diet -D% 1/2 NS at 100 mL/hr mIVF -Zofran PRN -If worsening distention, nausea, vomiting, then NG decompression  #AoCKD Baseline CKD2, renal function improving with hydration. -mIVF -Trend Cr  #Hypertension Normotensive to mildly hypotensive since admission. -Hold home amlodipine, labetalol  Dispo: Anticipated discharge in approximately 1-2 day(s).   Minus Liberty, MD 04/03/2016, 2:47 PM Pager: [phone]

## 2016-04-03 NOTE — Progress Notes (Signed)
Internal Medicine Attending:   I saw and examined the patient. I reviewed the resident's note and I agree with the resident's findings and plan as documented in the resident's note.  70 year old man admitted yesterday with acute ileus with distention of his large bowel and small bowel without obvious transition point. Yesterday he had very quiet bowel sounds. Overnight he had one large bowel movement and several small bowel movements this morning. He reports a small amount of flatus associated with this. No nausea or vomiting. He has not had an NG tube placed. He received IV fluids overnight and his creatinine decreased from 3.5 to 2.5. He's walking around his room this morning and feels largely well. On exam his abdomen is still distended, improved, now he has hyperactive bowel sounds. I think overall his ileus is improving, it's encouraging to see flatus and bowel movements. We will continue careful hydration today with D5 half-normal saline. Today we can give him clear liquid diet, but I gave him strict instructions if he has nausea to stop eating. We can advance his diet slowly, may be ready for discharge in 1-2 days.

## 2016-04-04 ENCOUNTER — Inpatient Hospital Stay (HOSPITAL_COMMUNITY): Payer: Medicare Other

## 2016-04-04 DIAGNOSIS — N189 Chronic kidney disease, unspecified: Secondary | ICD-10-CM

## 2016-04-04 DIAGNOSIS — N179 Acute kidney failure, unspecified: Secondary | ICD-10-CM | POA: Diagnosis present

## 2016-04-04 DIAGNOSIS — I10 Essential (primary) hypertension: Secondary | ICD-10-CM | POA: Diagnosis present

## 2016-04-04 NOTE — Progress Notes (Signed)
Internal Medicine Attending:   I saw and examined the patient. I reviewed the resident's note and I agree with the resident's findings and plan as documented in the resident's note.  Louis [name], did not have further bowel movements since yesterday, he has tolerated a liquid diet without any emesis however still has a distended abdomen with hypoactive bowel sounds.  He does not report any flatus since yesterday.  We will repeat a KUB today to evaluate his illeus, continue supportive care.  He may continue his liquid diet as long as he does not have significant nausea or vomiting.  AKI is improving with gentile IV hydration.

## 2016-04-04 NOTE — Progress Notes (Signed)
   Subjective: No acute events overnight. Patient has not had a bowel movement in approximately 24 hours. He is not passing gas. He continues on a clear liquid diet.  Objective:  Vital signs in last 24 hours: Vitals:   04/03/16 0427 04/03/16 1305 04/03/16 2119 04/04/16 0443  BP: (!) 145/77 (!) 155/86 137/67 (!) 128/56  Pulse: 91 84 82 80  Resp: 18 18 18 18   Temp: 98.6 F (37 C) 98.4 F (36.9 C) 98.5 F (36.9 C) 97.5 F (36.4 C)  TempSrc: Axillary Oral Oral Oral  SpO2: 95% 99% 98% 98%  Weight:      Height:       Physical Exam  Constitutional: He is oriented to person, place, and time. He appears well-developed and well-nourished.  Sitting up resting comfortably and in no acute distress  HENT:  Head: Normocephalic and atraumatic.  Cardiovascular: Normal rate and regular rhythm.  Exam reveals no gallop and no friction rub.   No murmur heard. Respiratory: Effort normal and breath sounds normal. No respiratory distress. He has no wheezes.  GI: He exhibits distension. There is tenderness.  Abdomen distended, bowel sounds hypoactive, mildly tender to palpation  Musculoskeletal: He exhibits no edema.  Neurological: He is alert and oriented to person, place, and time.     Assessment/Plan: [name] is a 70 year old gentleman presenting with a distended, tympanic abdomen and imaging consistent with ileus versus partial small bowel obstruction.  1. Abdominal pain and distention The patient presented with abdominal pain and distention following several episodes of nonbloody nonbilious emesis. Imaging is concerning for an ileus versus a partial bowel obstruction. Since admission he has had no episodes of emesis. He passed minimal stool yesterday morning but since then has not had another bowel movement. He is not producing gas. His bowel sounds are hypoactive and his abdomen remains distended on physical examination. At this time I think the most likely etiology of the patient's abdominal  pain and distention are secondary to either post viral ileus or partial small bowel obstruction. -- Continue clear liquid diet -- D5 half-normal saline at 100 ML's per hour -- Zofran  PRN -- If worsening distention, nausea or vomiting will need NG decompression and surgery consult  2. Acute on chronic kidney disease At baseline the patient has EKG stage II, his renal function has been improving with IV hydration. I suspect most likely etiology for the patient's acute on chronic kidney damage was secondary to prerenal physiology in the setting of emesis and poor by mouth intake. -- Maintenance IV fluids -- BMP tomorrow  3. Hypertension Most recent blood pressure 145/77. -- Hold home amlodipine and labetalol  DVT/PE prophylaxis: Lovenox 40 mg of cutaneous injection once daily FEN/GI: Clear liquid diet Dispo: Anticipated discharge in approximately 1-2 days.   Ophelia Shoulder, MD 04/04/2016, 12:21 PM Pager: [phone]

## 2016-04-05 DIAGNOSIS — K529 Noninfective gastroenteritis and colitis, unspecified: Secondary | ICD-10-CM | POA: Diagnosis present

## 2016-04-05 LAB — BASIC METABOLIC PANEL
ANION GAP: 7 (ref 5–15)
BUN: 23 mg/dL — AB (ref 6–20)
CHLORIDE: 107 mmol/L (ref 101–111)
CO2: 22 mmol/L (ref 22–32)
Calcium: 8.5 mg/dL — ABNORMAL LOW (ref 8.9–10.3)
Creatinine, Ser: 2.09 mg/dL — ABNORMAL HIGH (ref 0.61–1.24)
GFR calc Af Amer: 36 mL/min — ABNORMAL LOW (ref >60)
GFR calc non Af Amer: 31 mL/min — ABNORMAL LOW (ref >60)
GLUCOSE: 130 mg/dL — AB (ref 65–99)
POTASSIUM: 3.2 mmol/L — AB (ref 3.5–5.1)
SODIUM: 136 mmol/L (ref 135–145)

## 2016-04-05 LAB — RENAL FUNCTION PANEL
ANION GAP: 6 (ref 5–15)
Albumin: 2.8 g/dL — ABNORMAL LOW (ref 3.5–5.0)
BUN: 20 mg/dL (ref 6–20)
CALCIUM: 8.9 mg/dL (ref 8.9–10.3)
CO2: 23 mmol/L (ref 22–32)
Chloride: 107 mmol/L (ref 101–111)
Creatinine, Ser: 2.17 mg/dL — ABNORMAL HIGH (ref 0.61–1.24)
GFR, EST AFRICAN AMERICAN: 34 mL/min — AB (ref >60)
GFR, EST NON AFRICAN AMERICAN: 29 mL/min — AB (ref >60)
Glucose, Bld: 126 mg/dL — ABNORMAL HIGH (ref 65–99)
PHOSPHORUS: 1.8 mg/dL — AB (ref 2.5–4.6)
Potassium: 3.9 mmol/L (ref 3.5–5.1)
SODIUM: 136 mmol/L (ref 135–145)

## 2016-04-05 MED ORDER — POTASSIUM CHLORIDE CRYS ER 20 MEQ PO TBCR
40.0000 meq | EXTENDED_RELEASE_TABLET | Freq: Two times a day (BID) | ORAL | Status: AC
  Administered 2016-04-05 (×2): 40 meq via ORAL
  Filled 2016-04-05 (×2): qty 2

## 2016-04-05 NOTE — Progress Notes (Addendum)
   Subjective: No acute events overnight. Patient has not had a bowel movement in approximately 48 hours. Does report passing flatus and has been ambulating. Denies having any abdominal pain, nausea, or vomiting. He continues on a clear liquid diet.  Objective:  Vital signs in last 24 hours: Vitals:   04/04/16 2056 04/05/16 0006 04/05/16 0631 04/05/16 1233  BP: (!) 161/69 130/60 137/70 (!) 144/83  Pulse: 88  88 89  Resp: 19  18 18   Temp: 97.6 F (36.4 C) 98 F (36.7 C) 98.1 F (36.7 C) 98.7 F (37.1 C)  TempSrc: Oral  Oral Oral  SpO2: 99%  98% 100%  Weight:      Height:       Physical Exam  Constitutional: He is oriented to person, place, and time. He appears well-developed and well-nourished.  Sitting up resting comfortably and in no acute distress  HENT:  Head: Normocephalic and atraumatic.  Cardiovascular: Normal rate and regular rhythm.  Exam reveals no gallop and no friction rub.   No murmur heard. Respiratory: Effort normal and breath sounds normal. No respiratory distress. He has no wheezes.  GI: He exhibits distension. There is no tenderness.  Abdomen distended, bowel sounds hyperactive.  Musculoskeletal: He exhibits no edema.  Neurological: He is alert and oriented to person, place, and time.     Assessment/Plan: Mr. Reita Chard is a 70 year old gentleman presenting with a distended, tympanic abdomen and imaging consistent with ileus.   1. Ileus   Patient is passing flatus, however, abdomen continues to be distended and no BM in the past 48 hrs. Bowel sounds are hyperactive on physical examination. The most likely etiology of the patient's abdominal pain and distention are ileus likely secondary to viral/ infectious enterocolitis. Appears to have both small and large bowel distention on exam. A well defined transition zone is not seen. Noted to have imodium on home meds, which could have been a precipitating factor. Also noted to have an inguinal hernia on imaging, no  signs of incarceration/ strangulation at this point.  -- Continue clear liquid diet -- D5 half-normal saline at 100 ML's per hour -- Zofran  PRN --Do not prescribes any opioids  -- If worsening distention, nausea or vomiting will need NG decompression and surgery consult  2. Acute on chronic kidney disease Likely per-renal in the setting of emesis prior to admission and poor PO intake. His renal function has been improving with IV hydration. Cr 2.0 today.  -- Maintenance IV fluids -- BMP tomorrow  3. Hypertension BP stable.  -- Hold home amlodipine and labetalol  DVT/PE prophylaxis: Lovenox 40 mg of cutaneous injection once daily  FEN/GI: Clear liquid diet  Dispo: Anticipated discharge in approximately 1-2 days.   Shela Leff, MD 04/05/2016, 1:29 PM Pager: [phone]

## 2016-04-05 NOTE — Progress Notes (Signed)
Internal Medicine Attending  Date: 04/05/2016  Patient name: Louis [name] Medical record number: [id number] Date of birth: [birth year] Age: 70 y.o. Gender: male  I saw and evaluated the patient. I reviewed the resident's note by Dr. Marlowe Sax and I agree with the resident's findings and plans as documented in her progress note.  When seen on rounds this morning he looked quite well. That being said, his abdomen was distended and tympanic to percussion. He had hyperactive bowel sounds that were tinkling and rushing in nature. I personally reviewed the CT scan and it demonstrated both large and small bowel ileus without an obvious cut point. He did have a right inguinal hernia. His phosphorus and magnesium were normal 2 days ago and his potassium today was low and has been repleted. I agree he likely had an infectious enterocolitis that has resulted in an ileus. He was encouraged to ambulate about the floor in hopes of stimulating his bowels. We will continue current supportive care as we await the resolution of this ileus.

## 2016-04-06 LAB — RENAL FUNCTION PANEL
ALBUMIN: 2.7 g/dL — AB (ref 3.5–5.0)
ANION GAP: 8 (ref 5–15)
BUN: 19 mg/dL (ref 6–20)
CO2: 22 mmol/L (ref 22–32)
Calcium: 9 mg/dL (ref 8.9–10.3)
Chloride: 106 mmol/L (ref 101–111)
Creatinine, Ser: 2.18 mg/dL — ABNORMAL HIGH (ref 0.61–1.24)
GFR calc Af Amer: 34 mL/min — ABNORMAL LOW (ref >60)
GFR calc non Af Amer: 29 mL/min — ABNORMAL LOW (ref >60)
GLUCOSE: 133 mg/dL — AB (ref 65–99)
POTASSIUM: 3.8 mmol/L (ref 3.5–5.1)
Phosphorus: 2.2 mg/dL — ABNORMAL LOW (ref 2.5–4.6)
Sodium: 136 mmol/L (ref 135–145)

## 2016-04-06 LAB — MAGNESIUM: MAGNESIUM: 1.5 mg/dL — AB (ref 1.7–2.4)

## 2016-04-06 LAB — BASIC METABOLIC PANEL
Anion gap: 8 (ref 5–15)
BUN: 19 mg/dL (ref 6–20)
CALCIUM: 8.6 mg/dL — AB (ref 8.9–10.3)
CO2: 21 mmol/L — ABNORMAL LOW (ref 22–32)
CREATININE: 2.1 mg/dL — AB (ref 0.61–1.24)
Chloride: 105 mmol/L (ref 101–111)
GFR calc Af Amer: 35 mL/min — ABNORMAL LOW (ref >60)
GFR, EST NON AFRICAN AMERICAN: 30 mL/min — AB (ref >60)
GLUCOSE: 137 mg/dL — AB (ref 65–99)
POTASSIUM: 3.4 mmol/L — AB (ref 3.5–5.1)
SODIUM: 134 mmol/L — AB (ref 135–145)

## 2016-04-06 MED ORDER — POTASSIUM CHLORIDE CRYS ER 20 MEQ PO TBCR
40.0000 meq | EXTENDED_RELEASE_TABLET | Freq: Once | ORAL | Status: AC
  Administered 2016-04-06: 40 meq via ORAL
  Filled 2016-04-06: qty 2

## 2016-04-06 MED ORDER — GLYCERIN (LAXATIVE) 2.1 G RE SUPP
1.0000 | Freq: Once | RECTAL | Status: AC
  Administered 2016-04-06: 1 via RECTAL
  Filled 2016-04-06 (×2): qty 1

## 2016-04-06 MED ORDER — BISACODYL 10 MG RE SUPP: 10.0000 mg | Freq: Once | RECTAL | Status: DC

## 2016-04-06 MED ORDER — MAGNESIUM OXIDE 400 (241.3 MG) MG PO TABS
400.0000 mg | ORAL_TABLET | Freq: Once | ORAL | Status: AC
  Administered 2016-04-06: 400 mg via ORAL
  Filled 2016-04-06: qty 1

## 2016-04-06 MED ORDER — K PHOS MONO-SOD PHOS DI & MONO 155-852-130 MG PO TABS
250.0000 mg | ORAL_TABLET | Freq: Once | ORAL | Status: AC
  Administered 2016-04-06: 250 mg via ORAL
  Filled 2016-04-06: qty 1

## 2016-04-06 NOTE — Progress Notes (Signed)
Internal Medicine Attending  Date: 04/06/2016  Patient name: Louis [name] Medical record number: [id number] Date of birth: 22-Nov-1945 Age: 70 y.o. Gender: male  I saw and evaluated the patient. I reviewed the resident's note by Dr. Lovena Le and I agree with the resident's findings and plans as documented in his progress note.  When Dr. 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 Thus, I doubt this is playing any role in his current symptomatology. Review of his abdominal film reveals a barium brick in the area of his rectum. This may be delaying relief of his ileus to some degree and we have decided to try a suppository to see if we could expel this barium brick. When we talked about the patient's bowel habits he states that his usual bowel movement is every 5-6 days. This was before any of his recent illness. Thus, his motility is below average and may have been exacerbated by the recent infectious enterocolitis that we suspect he had. If he does not improve with a suppository we will consider a consult to GI given the motility issues for any other insights they may provide to help move along the patient's ileus.

## 2016-04-06 NOTE — Progress Notes (Signed)
   Subjective: No acute events overnight. Patient reports 3 episodes of flatus overnight. Has still not had a bowel movement. Denies nausea, vomiting or abdominal pain.  Objective:  Vital signs in last 24 hours: Vitals:   04/05/16 0006 04/05/16 0631 04/05/16 1233 04/06/16 0619  BP: 130/60 137/70 (!) 144/83 136/86  Pulse:  88 89 94  Resp:  18 18 17   Temp: 98 F (36.7 C) 98.1 F (36.7 C) 98.7 F (37.1 C) 98.5 F (36.9 C)  TempSrc:  Oral Oral Oral  SpO2:  98% 100% 93%  Weight:      Height:       Physical Exam  Constitutional: He is oriented to person, place, and time. He appears well-developed and well-nourished.  HENT:  Head: Normocephalic and atraumatic.  Cardiovascular: Normal rate and regular rhythm.  Exam reveals no gallop and no friction rub.   No murmur heard. Respiratory: Effort normal and breath sounds normal. No respiratory distress. He has no wheezes.  GI: He exhibits distension.  Abdomen remains distended. There is no pain to palpation. Bowel sounds are tympanic and peristaltic in nature.  Musculoskeletal: He exhibits no edema.  Neurological: He is alert and oriented to person, place, and time.     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 Ileus Patient's abdomen remains distended today. He denies nausea or vomiting. His bowel sounds are tympanic and peristaltic in nature. He had several episodes of flatus overnight but has not had a bowel movement. At this time we will continue with conservative and supportive management in hopes that the ileus resolves. -- Continue clear liquid diet -- D5 half-normal saline at 100 ML's per hour -- Zofran  PRN -- If worsening distention, nausea or vomiting will need NG decompression and surgery consult  2. Acute on chronic kidney disease The patient's acute on chronic kidney injury is most likely  prerenal in etiology with poor by mouth intake in the setting of gastroenteritis prior to admission. His creatinine continues to improve and most recently was 2.1. His creatinine on admission was 3.50. I expect that his creatinine will continue to improve and will stabilize near his baseline with continued IV hydration. -- Maintenance IV fluids -- BMP tomorrow  3. Electrolyte imbalance The patient has mildly low potassium, magnesium and phosphorus levels. On patient series reported in the literature prolonged ileus has been independently associated with hypokalemia, hypomagnesemia and hypophosphatemia. Therefore, although the patient's imbalance is minimal I think correcting these disturbances could shorten the course of his ileus. -- Replete electrolytes as needed  3. Hypertension Most recent blood pressure 136/86 -- Hold home amlodipine and labetalol  DVT/PE prophylaxis: Lovenox 40 mg of cutaneous injection once daily FEN/GI: Clear liquid diet  Dispo: Anticipated discharge depending on clinical improvement.  Ophelia Shoulder, MD 04/06/2016, 8:54 AM Pager: [phone]

## 2016-04-07 ENCOUNTER — Inpatient Hospital Stay (HOSPITAL_COMMUNITY): Payer: Medicare Other

## 2016-04-07 DIAGNOSIS — K567 Ileus, unspecified: Secondary | ICD-10-CM

## 2016-04-07 DIAGNOSIS — K5901 Slow transit constipation: Secondary | ICD-10-CM

## 2016-04-07 DIAGNOSIS — R935 Abnormal findings on diagnostic imaging of other abdominal regions, including retroperitoneum: Secondary | ICD-10-CM

## 2016-04-07 DIAGNOSIS — E876 Hypokalemia: Secondary | ICD-10-CM

## 2016-04-07 LAB — BASIC METABOLIC PANEL
Anion gap: 10 (ref 5–15)
BUN: 19 mg/dL (ref 6–20)
CHLORIDE: 101 mmol/L (ref 101–111)
CO2: 20 mmol/L — AB (ref 22–32)
Calcium: 8.7 mg/dL — ABNORMAL LOW (ref 8.9–10.3)
Creatinine, Ser: 2.18 mg/dL — ABNORMAL HIGH (ref 0.61–1.24)
GFR calc Af Amer: 34 mL/min — ABNORMAL LOW (ref >60)
GFR calc non Af Amer: 29 mL/min — ABNORMAL LOW (ref >60)
GLUCOSE: 159 mg/dL — AB (ref 65–99)
POTASSIUM: 3.3 mmol/L — AB (ref 3.5–5.1)
Sodium: 131 mmol/L — ABNORMAL LOW (ref 135–145)

## 2016-04-07 LAB — TSH: TSH: 1.778 u[IU]/mL (ref 0.350–4.500)

## 2016-04-07 MED ORDER — METOCLOPRAMIDE HCL 5 MG/ML IJ SOLN
10.0000 mg | Freq: Four times a day (QID) | INTRAMUSCULAR | Status: DC
  Administered 2016-04-07 – 2016-04-08 (×5): 10 mg via INTRAVENOUS
  Filled 2016-04-07 (×5): qty 2

## 2016-04-07 MED ORDER — POTASSIUM CHLORIDE CRYS ER 20 MEQ PO TBCR: 40.0000 meq | EXTENDED_RELEASE_TABLET | Freq: Once | ORAL | Status: DC

## 2016-04-07 MED ORDER — KCL IN DEXTROSE-NACL 30-5-0.45 MEQ/L-%-% IV SOLN
INTRAVENOUS | Status: DC
  Administered 2016-04-07 – 2016-04-08 (×2): via INTRAVENOUS
  Filled 2016-04-07 (×2): qty 1000

## 2016-04-07 NOTE — Care Management Important Message (Signed)
Important Message  Patient Details  Name: Louis [name] MRN: [id number] Date of Birth: 1945-10-22   Medicare Important Message Given:  Yes    Suheily Birks 04/07/2016, 12:03 PM

## 2016-04-07 NOTE — Progress Notes (Signed)
   Subjective: Patient had 2-3 episodes of emesis early this morning that were clear or slightly brownish in nature. Following this he said that he felt much better. There were no additional events overnight.  Objective:  Vital signs in last 24 hours: Vitals:   04/06/16 0619 04/06/16 1430 04/06/16 1947 04/07/16 0508  BP: 136/86 (!) 140/93 (!) 146/88 137/82  Pulse: 94 93 99 (!) 101  Resp: 17  18 16   Temp: 98.5 F (36.9 C) 98.9 F (37.2 C) 98.1 F (36.7 C) 98.4 F (36.9 C)  TempSrc: Oral Oral Oral Oral  SpO2: 93% 90% 98% 98%  Weight:      Height:       Physical Exam  Constitutional: He is oriented to person, place, and time. He appears well-developed and well-nourished.  HENT:  Head: Normocephalic and atraumatic.  Cardiovascular: Normal rate and regular rhythm.  Exam reveals no friction rub.   No murmur heard. Respiratory: Effort normal and breath sounds normal. No respiratory distress. He has no wheezes.  GI: He exhibits distension.  Patient's abdomen remains diffusely distended. Bowel sounds are hyperactive today. There is no tenderness on palpation.  Musculoskeletal: He exhibits no edema.  Neurological: He is alert and oriented to person, place, and time.     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 Ileus Patient's abdomen remains distended. I reviewed the imaging again and there does not appear to be a clear transition point indicating gross obstruction. When reexamining the KUB ordered over the weekend there does appear to be what looks like a large stool burden that could indicate chronic constipation. The exact etiology of the patient's ileus remains unknown however based on new history that the patient only has one bowel movement every 5-7 days I suspect there may be an underlying dysmotility issue and chronic constipation. We have  treated the patient with conservative measures for several days and at this time I think input from gastroenterology is appropriate. -- Continue clear liquid diet -- D5 half-normal saline with 30 mEq of potassium added at 100 mL per hour -- Zofran PRN -- Gastroenterology consult, will follow recommendations.  2. Acute on chronic kidney disease The patient's acute on chronic kidney injury is most likely prerenal in etiology with poor by mouth intake in the setting of gastroenteritis prior to admission. His creatinine continues to improve and most recently was 2.18. His creatinine on admission was 3.50. I expect that his creatinine will continue to improve and will stabilize near his baseline with continued IV hydration. -- Maintenance IV fluids -- BMP daily  3. Electrolyte imbalance The patient was slightly hypokalemic today. On retrospective multi variable analysis electrolyte abnormalities have been associated with prolonged ileus. Therefore, correcting any minor abnormalities is likely beneficial in this patient. -- Replete as necessary  3. Hypertension Most recent blood pressure 137/82 -- Hold home amlodipine and labetalol  DVT/PE prophylaxis: Lovenox 40 mg once daily FEN/GI:Clear liquid diet  Dispo: Anticipated discharge pending medical course.   Ophelia Shoulder, MD 04/07/2016, 10:42 AM Pager: [phone]

## 2016-04-07 NOTE — Progress Notes (Signed)
Patient had large amount of projectile vomit onto floor, all clear brownish tan color. Patient stated felt a lot better after throwing up.

## 2016-04-07 NOTE — Consult Note (Signed)
[facility] Consult: 9:26 AM 04/07/2016  LOS: 4 days    Referring Provider: Dr Heber  and Lovena Le from teaching service  Primary Care Physician:  Donetta Potts, MD Primary Gastroenterologist:  unassigned    Reason for Consultation:  PSBO vs ileus.   HPI: Louis [name] is a 70 y.o. male.  PMH, stage 2 CKD, renal cysts, and htn.   Has declined suggestion for screening colonoscopy in past.    3 to 4 weeks ago Dr Arty Baumgartner treated pt with abx for ? Food poisoning. Had 2 days of non-bloody n/v, 1 day of watery, non-bloody stool and abdominal distention/discomfort.  sxs improved only to recurr in the past several days.  Recurrent n/v/d and abdominal distention/pain.   Initial xray with prominent, ? thickened loop SB on left. CT scan w/o contrast; "dilatation of small bowel from the level of the proximal to mid jejunum throughout its course. There is distention of most of the colon. There is a significant amount of fluid throughout the colon as well as much of the distal small bowel. A well-defined transition zone suggesting a focal site of small or large bowel obstruction is not appreciated. There is no bowel wall or mesenteric thickening. Proximal SB normal.  Right inguinal hernia which contains a portion of a prolapsed urinary bladder with wall thickening in the bladder. There is fat in the left inguinal ring. There is a minimal ventral hernia containing only fat." Xray # 2, 04/04/16: persistent PSBO.    Over 6 day admission, diarrhea resolved, is belching a lot.  N/V ongoing, vomited light brown but not grossly bloody material in last 24 hours.  Belly still distended but not in pain.  Used total 6 mg Morphine on day of admission, no pain meds/narcotics since.   No new meds.  No previous hx of similar  problem before instance 3 weeks ago.   Fm Hx + for ulcers in dad and diverticular disease leading to ostomy in brother.  No intestinal cancers.      Past Medical History:  Diagnosis Date  . CKD (chronic kidney disease) stage 2, GFR 60-89 ml/min   . Hernia, abdominal   . Hypertension     Past Surgical History:  Procedure Laterality Date  . HERNIA REPAIR     in childhood  . TONSILLECTOMY      Prior to Admission medications   Medication Sig Start Date End Date Taking? Authorizing Provider  labetalol (NORMODYNE) 300 MG tablet Take 300 mg by mouth 2 (two) times daily. 03/02/16  Yes Historical Provider, MD  loperamide (IMODIUM) 2 MG capsule Take 2-4 mg by mouth daily as needed. 03/07/16  Yes Historical Provider, MD  omega-3 acid ethyl esters (LOVAZA) 1 g capsule Take 1 capsule by mouth daily. 03/22/16  Yes Historical Provider, MD  promethazine (PHENERGAN) 25 MG tablet Take 25 mg by mouth every 6 (six) hours as needed for nausea or vomiting.  03/31/16  Yes Historical Provider, MD  amLODipine (NORVASC) 10 MG tablet Take 10 mg by mouth daily. 02/11/16   Historical Provider,  MD    Scheduled Meds: . enoxaparin (LOVENOX) injection  40 mg Subcutaneous Q24H  . potassium chloride  40 mEq Oral Once   Infusions: . dextrose 5 % and 0.45% NaCl 100 mL/hr at 04/07/16 0123   PRN Meds: ondansetron **OR** ondansetron (ZOFRAN) IV   Allergies as of 04/02/2016  . (No Known Allergies)    History reviewed. No pertinent family history.  Social History   Social History  . Marital status: Married    Spouse name: N/A  . Number of children: N/A  . Years of education: N/A   Occupational History  . Not on file.   Social History Main Topics  . Smoking status: Never Smoker  . Smokeless tobacco: Never Used  . Alcohol use Yes     Comment: occ  . Drug use: No  . Sexual activity: Not on file   Other Topics Concern  . Not on file   Social History Narrative  . No narrative on file    REVIEW  OF SYSTEMS: Constitutional:  Generally no issues of strenght or stamina ENT:  No nose bleeds Pulm:  No SOB or cough CV:  No palpitations, no LE edema.  GU:  No hematuria, no frequency GI:  No dysphagia, no heartburn Heme:  No unusual bleeding or bruising   Transfusions:  None in past Neuro:  No headaches, no peripheral tingling or numbness Derm:  No itching, no rash or sores.  Endocrine:  No sweats or chills.  No polyuria or dysuria Immunization:  Did not inguire as to vaccinations Travel:  None beyond local counties in last few months.    PHYSICAL EXAM: Vital signs in last 24 hours: Vitals:   04/06/16 1947 04/07/16 0508  BP: (!) 146/88 137/82  Pulse: 99 (!) 101  Resp: 18 16  Temp: 98.1 F (36.7 C) 98.4 F (36.9 C)   Wt Readings from Last 3 Encounters:  04/02/16 106.1 kg (234 lb)    General: pleasant, comfortable, bloated looking WM.  Looks stated age Head:  No swelling or asymmetry  Eyes:  No pallor or icterus Ears:  Not HOH  Nose:  No congestion or discharge Mouth:  Moist and clear.  Tongue midline Neck:  No mass, no TMG Lungs:  Clear bil.  No cough or dyspnea Heart: RRR.  No mrg.  S1, s2 present Abdomen:  Distended, tense, NT.  High pitched, tympanitic BS,  Increase percussion.  .   Rectal: deferred   Musc/Skeltl: no joint redness, contracture or swelling Extremities:  No CCE  Neurologic:  Oriented x 3.  Moving all 4s.  No tremor.  Strength not tested but appears grossly nml. Skin:  No telangectasia, rash or sores. Nodes:  No cervical adenopathy.     Psych:  Pleasant, calm, cooperative.   Intake/Output from previous day: 12/03 0701 - 12/04 0700 In: -  Out: 650 [Urine:650] Intake/Output this shift: Total I/O In: 120 [P.O.:120] Out: -   LAB RESULTS: No results for input(s): WBC, HGB, HCT, PLT in the last 72 hours. BMET Lab Results  Component Value Date   NA 131 (L) 04/07/2016   NA 136 04/06/2016   NA 134 (L) 04/06/2016   K 3.3 (L) 04/07/2016   K 3.8  04/06/2016   K 3.4 (L) 04/06/2016   CL 101 04/07/2016   CL 106 04/06/2016   CL 105 04/06/2016   CO2 20 (L) 04/07/2016   CO2 22 04/06/2016   CO2 21 (L) 04/06/2016   GLUCOSE 159 (H) 04/07/2016  GLUCOSE 133 (H) 04/06/2016   GLUCOSE 137 (H) 04/06/2016   BUN 19 04/07/2016   BUN 19 04/06/2016   BUN 19 04/06/2016   CREATININE 2.18 (H) 04/07/2016   CREATININE 2.18 (H) 04/06/2016   CREATININE 2.10 (H) 04/06/2016   CALCIUM 8.7 (L) 04/07/2016   CALCIUM 9.0 04/06/2016   CALCIUM 8.6 (L) 04/06/2016   LFT  Recent Labs  04/05/16 1521 04/06/16 1542  ALBUMIN 2.8* 2.7*   Lipase     Component Value Date/Time   LIPASE 19 04/02/2016 0719    Drugs of Abuse  No results found for: LABOPIA, COCAINSCRNUR, LABBENZ, AMPHETMU, THCU, LABBARB   RADIOLOGY STUDIES: No results found.  ENDOSCOPIC STUDIES: None ever  IMPRESSION:   *  Ileus vs PSBO  *  Inguinal hernia, right.    *  Electrolyte derangement: K, mag, phos.    *  CKD.  AKI improved.    PLAN:     *  Need to continue to correct electrolytes.  ? Does he need surgical eval given persistence of sxs and xray findings?    Azucena Freed  04/07/2016, 9:26 AM Pager: [phone]     Attending physician's note   I have taken a history, examined the patient and reviewed the chart. I agree with the Advanced Practitioner's note, impression and recommendations. Persistent ileus, less likely PSBO. Chronic constipation with BM once/week for years. Had a large BM post enema today. Maintain clear liquids. Correct K, Mg, Phos. IV Reglan. Check TSH. F/U abdominal films. Gastrograffin enema to evaluate for obstruction.   Lucio Edward, MD [facility] [phone] Mon-Fri 8a-5p [phone] after 5p, weekends, holidays

## 2016-04-08 ENCOUNTER — Inpatient Hospital Stay (HOSPITAL_COMMUNITY): Payer: Medicare Other

## 2016-04-08 LAB — BASIC METABOLIC PANEL
ANION GAP: 12 (ref 5–15)
BUN: 25 mg/dL — ABNORMAL HIGH (ref 6–20)
CALCIUM: 9 mg/dL (ref 8.9–10.3)
CO2: 18 mmol/L — AB (ref 22–32)
CREATININE: 2.67 mg/dL — AB (ref 0.61–1.24)
Chloride: 104 mmol/L (ref 101–111)
GFR calc non Af Amer: 23 mL/min — ABNORMAL LOW (ref >60)
GFR, EST AFRICAN AMERICAN: 26 mL/min — AB (ref >60)
Glucose, Bld: 165 mg/dL — ABNORMAL HIGH (ref 65–99)
Potassium: 3.8 mmol/L (ref 3.5–5.1)
SODIUM: 134 mmol/L — AB (ref 135–145)

## 2016-04-08 LAB — MAGNESIUM: MAGNESIUM: 1.5 mg/dL — AB (ref 1.7–2.4)

## 2016-04-08 MED ORDER — KCL IN DEXTROSE-NACL 20-5-0.9 MEQ/L-%-% IV SOLN
INTRAVENOUS | Status: AC
  Administered 2016-04-08: 10:00:00 via INTRAVENOUS
  Filled 2016-04-08 (×2): qty 1000

## 2016-04-08 MED ORDER — KCL IN DEXTROSE-NACL 20-5-0.9 MEQ/L-%-% IV SOLN
INTRAVENOUS | Status: DC
  Administered 2016-04-08 – 2016-04-10 (×5): via INTRAVENOUS
  Filled 2016-04-08 (×9): qty 1000

## 2016-04-08 MED ORDER — RAMELTEON 8 MG PO TABS
8.0000 mg | ORAL_TABLET | Freq: Every day | ORAL | Status: DC
  Administered 2016-04-08 – 2016-04-12 (×5): 8 mg via ORAL
  Filled 2016-04-08 (×9): qty 1

## 2016-04-08 MED ORDER — IOPAMIDOL (ISOVUE-300) INJECTION 61%
INTRAVENOUS | Status: AC
  Administered 2016-04-08: 750 mL
  Filled 2016-04-08: qty 900

## 2016-04-08 MED ORDER — PHENOL 1.4 % MT LIQD
1.0000 | OROMUCOSAL | Status: DC | PRN
  Administered 2016-04-08: 1 via OROMUCOSAL
  Filled 2016-04-08: qty 177

## 2016-04-08 MED ORDER — MAGNESIUM SULFATE IN D5W 1-5 GM/100ML-% IV SOLN
1.0000 g | Freq: Once | INTRAVENOUS | Status: AC
  Administered 2016-04-08: 1 g via INTRAVENOUS
  Filled 2016-04-08: qty 100

## 2016-04-08 MED ORDER — MAGNESIUM SULFATE IN D5W 1-5 GM/100ML-% IV SOLN
1.0000 g | Freq: Once | INTRAVENOUS | Status: AC
Start: 2016-04-08 — End: 2016-04-08
  Administered 2016-04-08: 1 g via INTRAVENOUS
  Filled 2016-04-08: qty 100

## 2016-04-08 MED ORDER — IOPAMIDOL (ISOVUE-300) INJECTION 61%
900.0000 mL | Freq: Once | INTRAVENOUS | Status: AC | PRN
  Administered 2016-04-08: 750 mL

## 2016-04-08 MED ORDER — LORAZEPAM 2 MG/ML IJ SOLN
1.0000 mg | Freq: Once | INTRAMUSCULAR | Status: AC
  Administered 2016-04-08: 1 mg via INTRAVENOUS
  Filled 2016-04-08: qty 1

## 2016-04-08 NOTE — Consult Note (Signed)
[facility] Surgery Consult Note  [name] [birth year]  [id number].    Requesting MD: Fuller Plan Chief Complaint/Reason for Consult: Colon mass  HPI:  Louis [name] is a 70yo male admitted to [facility] 04/02/16 with 5 days of abdominal pain. 3-4 weeks prior he had been treated with antibiotics for food poisoning and had resolution of his symptoms. 5 days prior to admission his nausea, vomiting, diarrhea, and abdominal pain/distension recurred.  CT abdomen/pelvis wo contrast showed multiple loops of dilated small and large bowel and no well-defined transition; suspected ileus likely with associated enterocolitis. Gastrograffin enema was performed to evaluate for obstruction and showed an obstructing lesion at the junction of the descending and sigmoid colon segments. General surgery consulted for eventual surgical management of lesion. Reports improvement in his abdominal pain, nausea, and vomiting. He has only been able to have BM with enema since admission. Not passing flatus. NGT is helping. He does still feel bloated.  PMH significant for HTN, CKD2, right inguinal hernia, chronic constipation Abdominal surgical history includes LIH repair age 58 He has never had a colonoscopy Denies home use of anticoagulants Nonsmoker Employment: sells trucks  ROS: Review of Systems  Constitutional: Negative.   Respiratory: Negative.   Cardiovascular: Negative.   Gastrointestinal: Positive for abdominal pain, constipation, diarrhea, nausea and vomiting.  Genitourinary: Negative.   Skin: Negative.      History reviewed. No pertinent family history.  Past Medical History:  Diagnosis Date  . CKD (chronic kidney disease) stage 2, GFR 60-89 ml/min   . Hernia, abdominal   . Hypertension     Past Surgical History:  Procedure Laterality Date  . HERNIA REPAIR     in childhood  . TONSILLECTOMY      Social History:  reports that he has never smoked. He has never used smokeless tobacco. He  reports that he drinks alcohol. He reports that he does not use drugs.  Allergies: No Known Allergies  Medications Prior to Admission  Medication Sig Dispense Refill  . labetalol (NORMODYNE) 300 MG tablet Take 300 mg by mouth 2 (two) times daily.  0  . loperamide (IMODIUM) 2 MG capsule Take 2-4 mg by mouth daily as needed.  0  . omega-3 acid ethyl esters (LOVAZA) 1 g capsule Take 1 capsule by mouth daily.  0  . promethazine (PHENERGAN) 25 MG tablet Take 25 mg by mouth every 6 (six) hours as needed for nausea or vomiting.   0  . amLODipine (NORVASC) 10 MG tablet Take 10 mg by mouth daily.  0    Blood pressure 134/85, pulse (!) 101, temperature 97 F (36.1 C), temperature source Oral, resp. rate 19, height _0  (1.956 m), weight 234 lb (106.1 kg), SpO2 98 %. Physical Exam: General: pleasant, WD/WN white male who is laying in bed in NAD HEENT: head is normocephalic, atraumatic.  Sclera are noninjected.  Mouth is dry Heart: regular, rate, and rhythm.  No obvious murmurs, gallops, or rubs noted.  Palpable pedal pulses bilaterally Lungs: CTAB, no wheezes, rhonchi, or rales noted.  Respiratory effort nonlabored Abd: soft, moderately distended, mild global tenderness, present but hypoactive BS MS: all 4 extremities are symmetrical with no cyanosis, clubbing, or edema. Skin: warm and dry with no masses, lesions, or rashes Psych: A&Ox3 with an appropriate affect. Neuro: CM 2-12 intact, extremity CSM intact bilaterally, normal speech  Results for orders placed or performed during the hospital encounter of 04/02/16 (from the past 48 hour(s))  Renal function panel  Status: Abnormal   Collection Time: 04/06/16  3:42 PM  Result Value Ref Range   Sodium 136 135 - 145 mmol/L   Potassium 3.8 3.5 - 5.1 mmol/L   Chloride 106 101 - 111 mmol/L   CO2 22 22 - 32 mmol/L   Glucose, Bld 133 (H) 65 - 99 mg/dL   BUN 19 6 - 20 mg/dL   Creatinine, Ser 2.18 (H) 0.61 - 1.24 mg/dL   Calcium 9.0 8.9 - 10.3  mg/dL   Phosphorus 2.2 (L) 2.5 - 4.6 mg/dL   Albumin 2.7 (L) 3.5 - 5.0 g/dL   GFR calc non Af Amer 29 (L) >60 mL/min   GFR calc Af Amer 34 (L) >60 mL/min    Comment: (NOTE) The eGFR has been calculated using the CKD EPI equation. This calculation has not been validated in all clinical situations. eGFR's persistently <60 mL/min signify possible Chronic Kidney Disease.    Anion gap 8 5 - 15  Basic metabolic panel     Status: Abnormal   Collection Time: 04/07/16  4:47 AM  Result Value Ref Range   Sodium 131 (L) 135 - 145 mmol/L   Potassium 3.3 (L) 3.5 - 5.1 mmol/L   Chloride 101 101 - 111 mmol/L   CO2 20 (L) 22 - 32 mmol/L   Glucose, Bld 159 (H) 65 - 99 mg/dL   BUN 19 6 - 20 mg/dL   Creatinine, Ser 2.18 (H) 0.61 - 1.24 mg/dL   Calcium 8.7 (L) 8.9 - 10.3 mg/dL   GFR calc non Af Amer 29 (L) >60 mL/min   GFR calc Af Amer 34 (L) >60 mL/min    Comment: (NOTE) The eGFR has been calculated using the CKD EPI equation. This calculation has not been validated in all clinical situations. eGFR's persistently <60 mL/min signify possible Chronic Kidney Disease.    Anion gap 10 5 - 15  TSH Once     Status: None   Collection Time: 04/07/16  2:54 PM  Result Value Ref Range   TSH 1.778 0.350 - 4.500 uIU/mL    Comment: Performed by a 3rd Generation assay with a functional sensitivity of <=0.01 uIU/mL.  Basic metabolic panel     Status: Abnormal   Collection Time: 04/08/16  6:32 AM  Result Value Ref Range   Sodium 134 (L) 135 - 145 mmol/L   Potassium 3.8 3.5 - 5.1 mmol/L   Chloride 104 101 - 111 mmol/L   CO2 18 (L) 22 - 32 mmol/L   Glucose, Bld 165 (H) 65 - 99 mg/dL   BUN 25 (H) 6 - 20 mg/dL   Creatinine, Ser 2.67 (H) 0.61 - 1.24 mg/dL   Calcium 9.0 8.9 - 10.3 mg/dL   GFR calc non Af Amer 23 (L) >60 mL/min   GFR calc Af Amer 26 (L) >60 mL/min    Comment: (NOTE) The eGFR has been calculated using the CKD EPI equation. This calculation has not been validated in all clinical  situations. eGFR's persistently <60 mL/min signify possible Chronic Kidney Disease.    Anion gap 12 5 - 15  Magnesium     Status: Abnormal   Collection Time: 04/08/16  6:32 AM  Result Value Ref Range   Magnesium 1.5 (L) 1.7 - 2.4 mg/dL   Dg Abd 2 Views  Result Date: 04/07/2016 CLINICAL DATA:  Ileus. EXAM: ABDOMEN - 2 VIEW COMPARISON:  04/04/2016 FINDINGS: There is persistent prominent gaseous dilatation diffusely involving small bowel an at least a portion of  the colon. The degree of bowel dilatation is stable to minimally increased compared to the prior study. Distal colonic/ rectal contrast on the prior study is no longer seen. No intraperitoneal free air is identified. No acute osseous abnormality is seen. IMPRESSION: 1. Persistent diffuse dilatation of small and large bowel loops suggestive of ileus. 2. Interval passage of residual GI contrast material. Electronically Signed   By: Logan Bores M.D.   On: 04/07/2016 17:57   Dg Colon W/water Sol Cm  Result Date: 04/08/2016 CLINICAL DATA:  70 year old male with unresolved abdominal distension due to widespread small and large bowel gaseous distention. Still, there was slow transit of oral contrast administered for the CT on 04/02/2016, which did cleared the intestinal system by twelfth 08/2015. Water-soluble enema requested to exclude obstruction and/or constipation. EXAM: SINGLE COLUMN BARIUM ENEMA TECHNIQUE: Initial scout AP supine abdominal image obtained to insure adequate colon cleansing. Barium was introduced into the colon in a retrograde fashion and refluxed from the rectum to the cecum. Spot images of the colon followed by overhead radiographs were obtained. FLUOROSCOPY TIME:  Fluoroscopy Time:  6 minutes 42 seconds Radiation Exposure Index (if provided by the fluoroscopic device): Number of Acquired Spot Images: 2 COMPARISON:  Abdominal radiographs 04/07/2016 and earlier. CT Abdomen and Pelvis 04/02/2016. FINDINGS: Preprocedural scout view  of the abdomen and pelvis demonstrates persistent dilated small and large bowel loops. The rectum was carefully cannulated, and water-soluble contrast was instilled via gravity. However, the balloon on the initial rectal catheter would not stay inflated. After contrast filling of the rectum and a redundant sigmoid, contrast only leaked from the anal verge. After several attempts at re- inflating the balloon the initial catheter was removed and a second rectal catheter placed. With further contrast filling of the rectum and sigmoid colon an obstruction to the retrograde flow of contrast was encounter did at the junction of the descending and sigmoid colon segments (series 15). After repositioning the patient and re- attempting contrast installation contrast was successfully refluxed proximal to this obstruction. Ultimately the entire left colon, transverse colon, and right colon were able to be opacified. However, a short segment of severe irregularity and high-grade appearing stenosis persisted at the junction of the left and sigmoid colons. See series 27 and 28. On re-inspection of the left lower quadrant large bowel on the recent CT Abdomen and Pelvis a corresponding 3-4 cm segment of abnormal large bowel is demonstrated (coronal image 74 of the CT comparison). Where there appears to be bowel wall thickening, narrowing of the lumen, and mild adjacent mesenteric stranding. IMPRESSION: 1. Obstructing lesion at the junction of the descending and sigmoid colon segments. Recommend Colonoscopy. While this might be a benign stricture, the Radiographic and CT appearance of this lesion is suspicious for an obstructing Adenocarcinoma. 2. With effort water-soluble contrast was able to be refluxed past the obstruction into the more proximal colon, and the other large bowel segments appear normal. 3. Study discussed by telephone with Dr. Filbert Berthold on 04/08/2016 At 1430 hours. Electronically Signed   By: Genevie Ann M.D.   On:  04/08/2016 14:58      Assessment/Plan Obstructing lesion at the junction of the descending and sigmoid colon segments - patient with intermittent abdominal pain/distension, nausea, and vomiting over the last 1-2 months initially thought to be due to food poisoning - CT abdomen/pelvis wo contrast showed multiple loops of dilated small and large bowel and no well-defined transition; suspected ileus likely with associated enterocolitis - Gastrograffin enema  04/08/16 showed above mentioned lesion - flexible sigmoidoscopy planned for tomorrow with GI - patient has never had a colonoscopy  HTN CKD2 Right inguinal hernia Chronic constipation H/o LIH repair age 4  Plan - flexible sigmoidoscopy tomorrow. Will discuss timing of surgery with MD.  Jerrye Beavers, [facility] Surgery 04/08/2016, 3:28 PM Pager: [phone] Consults: [phone] Mon-Fri 7:00 am-4:30 pm Sat-Sun 7:00 am-11:30 am

## 2016-04-08 NOTE — Consult Note (Signed)
Daily Rounding Note  04/08/2016, 10:26 AM  LOS: 5 days   SUBJECTIVE:   Chief complaint:     Large BM after enema yesterday.  Nothing since then, no flatus.  Nausea and emesis "projectile" emesis of bilious material last night.  Also nausea as he tried to recline for sleep, so slept in chair.  This AM able to lay flat without nausea.  Still very distended.   OBJECTIVE:         Vital signs in last 24 hours:    Temp:  [97 F (36.1 C)-100.5 F (38.1 C)] 97 F (36.1 C) (12/05 0635) Pulse Rate:  [101] 101 (12/05 0635) Resp:  [16-19] 19 (12/05 0635) BP: (134-153)/(85-89) 134/85 (12/05 0635) SpO2:  [98 %] 98 % (12/05 0635) Last BM Date: 04/07/16 Filed Weights   04/02/16 0724 04/02/16 1720  Weight: 106.1 kg (234 lb) 106.1 kg (234 lb)   General: fairly comfortable, mildly ill looking   Heart: tachy, regular.   Chest: few fine rales in right base, no dyspnea or cough Abdomen: very protuberant/distended and tense.  NT.  BS not audible and no tinkling or high pitched sounds eithter  Extremities: no CCE Neuro/Psych:  Alert, oriented x 3.  No gross weakness or deficits  Intake/Output from previous day: 12/04 0701 - 12/05 0700 In: 2388.3 [P.O.:800; I.V.:1588.3] Out: 800 [Urine:800]  Intake/Output this shift: Total I/O In: 30 [P.O.:30] Out: -   Lab Results: No results for input(s): WBC, HGB, HCT, PLT in the last 72 hours. BMET  Recent Labs  04/06/16 1542 04/07/16 0447 04/08/16 0632  NA 136 131* 134*  K 3.8 3.3* 3.8  CL 106 101 104  CO2 22 20* 18*  GLUCOSE 133* 159* 165*  BUN 19 19 25*  CREATININE 2.18* 2.18* 2.67*  CALCIUM 9.0 8.7* 9.0   LFT  Recent Labs  04/05/16 1521 04/06/16 1542  ALBUMIN 2.8* 2.7*   TSH   1.77    Studies/Results: Dg Abd 2 Views  Result Date: 04/07/2016 CLINICAL DATA:  Ileus. EXAM: ABDOMEN - 2 VIEW COMPARISON:  04/04/2016 FINDINGS: There is persistent prominent gaseous dilatation  diffusely involving small bowel an at least a portion of the colon. The degree of bowel dilatation is stable to minimally increased compared to the prior study. Distal colonic/ rectal contrast on the prior study is no longer seen. No intraperitoneal free air is identified. No acute osseous abnormality is seen. IMPRESSION: 1. Persistent diffuse dilatation of small and large bowel loops suggestive of ileus. 2. Interval passage of residual GI contrast material. Electronically Signed   By: Logan Bores M.D.   On: 04/07/2016 17:57   Scheduled Meds: . enoxaparin (LOVENOX) injection  40 mg Subcutaneous Q24H  . magnesium sulfate 1 - 4 g bolus IVPB  1 g Intravenous Once  . metoCLOPramide (REGLAN) injection  10 mg Intravenous Q6H  . ramelteon  8 mg Oral QHS   Continuous Infusions: . dextrose 5 % and 0.9 % NaCl with KCl 20 mEq/L 100 mL/hr at 04/08/16 1021   PRN Meds:.iopamidol, ondansetron **OR** ondansetron (ZOFRAN) IV  ASSESMENT:   *  Ileus vs PSBO.   Ongoing distention and sxs.  For GG enema today *  Inguinal hernia, right.    *  Electrolyte derangement: K, mag, phos.  K corrected, Mag remains depressed despite dose of Mag oxide, so mag sulfate IV given this AM.    Phos not rechecked today, got K phos yesterday.    *  CKD.  AKI worse.    *  Hypoproteinemia.     PLAN   *  Await finding of GG enema.  Suspect he needs NGT but wait for GGE findings. .    *  Electrolytes and BMET in AM.    *  Up IVF to 150/hour x 6 hours, then to 125/hour.    Azucena Freed  04/08/2016, 10:26 AM Pager: [phone]     Attending physician's note   I have taken an interval history, reviewed the chart and examined the patient. I agree with the Advanced Practitioner's note, impression and recommendations. Abd films show a persistent SB and colonic ileus. Had vomiting last night and abdomen remains distended with absent BS today. Continue current mgmt. NGT to LIS if not improved today after GG enema.    Lucio Edward, MD [facility] [phone] Mon-Fri 8a-5p [phone] after 5p, weekends, holidays

## 2016-04-08 NOTE — Progress Notes (Signed)
Heard from Dr Lovena Le.  Radiologist called him to say GGE shows what may be sigmoid apple-core lesion.  Plan is for NGT to LIS, flex sig in AM Stopped Lovenox and had PAS stockings placed to allow for safe biopsy.  Also stopped Reglan.   Azucena Freed PA-C.  GG enema images reviewed and report read. Tight obstructing lesion at sigmoid/descending junction. Begin NGT to LIS. DC Reglan. Gen Surgery consult. 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   Pricilla Riffle. Fuller Plan, MD

## 2016-04-08 NOTE — Progress Notes (Addendum)
   Subjective: No acute events overnight. Patient stated that he felt extremely nauseous overnight but did not have any episodes of emesis. He did not get much sleep as he was unable to lay down secondary to increased nausea. He had one large bowel movement yesterday afternoon following the enema but has not had any since. Objective:  Vital signs in last 24 hours: Vitals:   04/07/16 1435 04/07/16 2100 04/07/16 2354 04/08/16 0635  BP: (!) 153/88 139/89  134/85  Pulse: (!) 101 (!) 101  (!) 101  Resp: 16 19  19   Temp: 97.5 F (36.4 C) (!) 100.5 F (38.1 C) 98.5 F (36.9 C) 97 F (36.1 C)  TempSrc: Oral Oral  Oral  SpO2: 98% 98%  98%  Weight:      Height:       Physical Exam  Constitutional: He is oriented to person, place, and time. He appears well-developed and well-nourished.  HENT:  Head: Normocephalic and atraumatic.  Cardiovascular: Normal rate and regular rhythm.  Exam reveals no gallop and no friction rub.   No murmur heard. Respiratory: Effort normal and breath sounds normal. No respiratory distress. He has no wheezes.  GI: He exhibits distension.  Abdomen remains distended on examination, bowel sounds are present and are not hyperactive or hypoactive on my examination.  Musculoskeletal: He exhibits no edema.  Neurological: He is alert and oriented to person, place, and time.     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 Ileus The patient's abdomen remains distended. He had one large bowel movement yesterday following a soapsuds enema. He continues to have nausea but had no episodes of emesis overnight. Repeat abdominal imaging shows continued distention consistent with ileus. Gastroenterology saw the patient and recommended starting the patient on Reglan. Small bowel obstruction seems unlikely given that the patient has passed oral contrast  through the entire course of his GI tract. We will proceed with more imaging of the colon. -- NPO -- D5 half-normal saline with 20 mEq of potassium added at 100 mL per hour -- Zofran PRN -- Metoclopramide 10 mg IV every 6 hours   2. Acute on chronic kidney disease The patient's acute on chronic kidney injury is most likely prerenal in etiology with poor by mouth intake in the setting of gastroenteritis prior to admission. His creatinine continues to improve and most recently was 2.67. His creatinine on admission was 3.50. I expect that his creatinine will continue to improve and will stabilize near his baseline with continued IV hydration. -- Maintenance IV fluids -- BMP daily  3. Electrolyte imbalance Potassium within normal limits. -- Magnesium 1.5., Replete  3. Hypertension -- Hold home amlodipine and labetalol  DVT/PE prophylaxis: Lovenox 40 mg once daily FEN/GI:Clear liquid diet  Dispo: Anticipated discharge pending medical improvement.   Ophelia Shoulder, MD 04/08/2016, 6:47 AM Pager: [phone]

## 2016-04-08 NOTE — Progress Notes (Signed)
Pt asked for med to help him sleep tonight. Md ordered Rozerem 8mg  Po. Pt refused to take it at first beacause he fells nauseous. Abd very tight and very distended. Md made aware of it. Doesn't want to order IV medicine. Pt agree to take Rozerem.

## 2016-04-09 ENCOUNTER — Encounter (HOSPITAL_COMMUNITY): Payer: Self-pay | Admitting: *Deleted

## 2016-04-09 ENCOUNTER — Encounter (HOSPITAL_COMMUNITY)
Admission: EM | Disposition: A | Discharge status: Home Health | Payer: Self-pay | Source: Home / Self Care | Attending: Internal Medicine

## 2016-04-09 DIAGNOSIS — Z9889 Other specified postprocedural states: Secondary | ICD-10-CM

## 2016-04-09 DIAGNOSIS — C186 Malignant neoplasm of descending colon: Principal | ICD-10-CM

## 2016-04-09 DIAGNOSIS — Z978 Presence of other specified devices: Secondary | ICD-10-CM

## 2016-04-09 DIAGNOSIS — K56699 Other intestinal obstruction unspecified as to partial versus complete obstruction: Secondary | ICD-10-CM

## 2016-04-09 DIAGNOSIS — R933 Abnormal findings on diagnostic imaging of other parts of digestive tract: Secondary | ICD-10-CM

## 2016-04-09 HISTORY — PX: FLEXIBLE SIGMOIDOSCOPY: SHX5431

## 2016-04-09 LAB — CBC
HEMATOCRIT: 36 % — AB (ref 39.0–52.0)
Hemoglobin: 12 g/dL — ABNORMAL LOW (ref 13.0–17.0)
MCH: 28 pg (ref 26.0–34.0)
MCHC: 33.3 g/dL (ref 30.0–36.0)
MCV: 83.9 fL (ref 78.0–100.0)
PLATELETS: 208 10*3/uL (ref 150–400)
RBC: 4.29 MIL/uL (ref 4.22–5.81)
RDW: 15.7 % — AB (ref 11.5–15.5)
WBC: 9.3 10*3/uL (ref 4.0–10.5)

## 2016-04-09 LAB — TYPE AND SCREEN
ABO/RH(D): A POS
ANTIBODY SCREEN: NEGATIVE

## 2016-04-09 LAB — BASIC METABOLIC PANEL
ANION GAP: 6 (ref 5–15)
BUN: 27 mg/dL — ABNORMAL HIGH (ref 6–20)
CALCIUM: 8.9 mg/dL (ref 8.9–10.3)
CO2: 28 mmol/L (ref 22–32)
Chloride: 105 mmol/L (ref 101–111)
Creatinine, Ser: 2.67 mg/dL — ABNORMAL HIGH (ref 0.61–1.24)
GFR calc non Af Amer: 23 mL/min — ABNORMAL LOW (ref >60)
GFR, EST AFRICAN AMERICAN: 26 mL/min — AB (ref >60)
GLUCOSE: 169 mg/dL — AB (ref 65–99)
POTASSIUM: 4.2 mmol/L (ref 3.5–5.1)
Sodium: 139 mmol/L (ref 135–145)

## 2016-04-09 LAB — MAGNESIUM: Magnesium: 2.1 mg/dL (ref 1.7–2.4)

## 2016-04-09 LAB — ABO/RH: ABO/RH(D): A POS

## 2016-04-09 LAB — PHOSPHORUS: PHOSPHORUS: 3 mg/dL (ref 2.5–4.6)

## 2016-04-09 SURGERY — SIGMOIDOSCOPY, FLEXIBLE
Anesthesia: Moderate Sedation

## 2016-04-09 MED ORDER — MIDAZOLAM HCL 10 MG/2ML IJ SOLN
INTRAMUSCULAR | Status: DC | PRN
  Administered 2016-04-09 (×2): 1 mg via INTRAVENOUS
  Administered 2016-04-09: 2 mg via INTRAVENOUS

## 2016-04-09 MED ORDER — MIDAZOLAM HCL 5 MG/ML IJ SOLN
INTRAMUSCULAR | Status: AC
  Filled 2016-04-09: qty 2

## 2016-04-09 MED ORDER — SPOT INK MARKER SYRINGE KIT
PACK | SUBMUCOSAL | Status: DC | PRN
  Administered 2016-04-09: 2 mL via SUBMUCOSAL

## 2016-04-09 MED ORDER — FENTANYL CITRATE (PF) 100 MCG/2ML IJ SOLN
INTRAMUSCULAR | Status: AC
  Filled 2016-04-09: qty 2

## 2016-04-09 MED ORDER — SPOT INK MARKER SYRINGE KIT
PACK | SUBMUCOSAL | Status: AC
  Filled 2016-04-09: qty 5

## 2016-04-09 MED ORDER — DEXTROSE 5 % IV SOLN
1.0000 g | INTRAVENOUS | Status: AC
  Administered 2016-04-10: 2 g via INTRAVENOUS
  Filled 2016-04-09 (×2): qty 1

## 2016-04-09 MED ORDER — SODIUM CHLORIDE 0.9 % IV SOLN
INTRAVENOUS | Status: DC
  Administered 2016-04-09: 08:00:00 via INTRAVENOUS

## 2016-04-09 MED ORDER — FENTANYL CITRATE (PF) 100 MCG/2ML IJ SOLN
INTRAMUSCULAR | Status: DC | PRN
Start: 2016-04-09 — End: 2016-04-09
  Administered 2016-04-09 (×2): 25 ug via INTRAVENOUS

## 2016-04-09 NOTE — Progress Notes (Signed)
Subjective: Abdomen is very distended and he says he's been sick about a month.  He says the abdomen is better than before with NG in.  The cannister is full, but some of it is ice.  I can see it at the bedside.    Objective: Vital signs in last 24 hours: Temp:  [98 F (36.7 C)-98.2 F (36.8 C)] 98.2 F (36.8 C) (12/05 2245) Pulse Rate:  [99-111] 109 (12/06 0537) Resp:  [17-22] 22 (12/06 0537) BP: (136-145)/(81-92) 137/81 (12/06 0537) SpO2:  [93 %-98 %] 93 % (12/06 0537) Last BM Date: 04/07/16 240 PO NG 40 Urine 1200 NG 1700 BM x 1 TM 100.5, VSS, tachycardic and BP up some Na 134 Creatinine is rising 2.67 Glucose 165 Mag 1.5  Intake/Output from previous day: 12/05 0701 - 12/06 0700 In: 2142.5 [P.O.:240; I.V.:1862.5; NG/GT:40] Out: 2900 [Urine:1200; Emesis/NG output:1700] Intake/Output this shift: No intake/output data recorded.  General appearance: alert, cooperative and no distress GI: very distended abdomen, but no tenderness or peritonitis  Lab Results:  No results for input(s): WBC, HGB, HCT, PLT in the last 72 hours.  BMET  Recent Labs  04/07/16 0447 04/08/16 0632  NA 131* 134*  K 3.3* 3.8  CL 101 104  CO2 20* 18*  GLUCOSE 159* 165*  BUN 19 25*  CREATININE 2.18* 2.67*  CALCIUM 8.7* 9.0   PT/INR No results for input(s): LABPROT, INR in the last 72 hours.   Recent Labs Lab 04/05/16 1521 04/06/16 1542  ALBUMIN 2.8* 2.7*     Lipase     Component Value Date/Time   LIPASE 19 04/02/2016 0719     Studies/Results: Dg Abd 2 Views  Result Date: 04/07/2016 CLINICAL DATA:  Ileus. EXAM: ABDOMEN - 2 VIEW COMPARISON:  04/04/2016 FINDINGS: There is persistent prominent gaseous dilatation diffusely involving small bowel an at least a portion of the colon. The degree of bowel dilatation is stable to minimally increased compared to the prior study. Distal colonic/ rectal contrast on the prior study is no longer seen. No intraperitoneal free air is  identified. No acute osseous abnormality is seen. IMPRESSION: 1. Persistent diffuse dilatation of small and large bowel loops suggestive of ileus. 2. Interval passage of residual GI contrast material. Electronically Signed   By: Logan Bores M.D.   On: 04/07/2016 17:57   Dg Colon W/water Sol Cm  Result Date: 04/08/2016 CLINICAL DATA:  70 year old male with unresolved abdominal distension due to widespread small and large bowel gaseous distention. Still, there was slow transit of oral contrast administered for the CT on 04/02/2016, which did cleared the intestinal system by twelfth 08/2015. Water-soluble enema requested to exclude obstruction and/or constipation. EXAM: SINGLE COLUMN BARIUM ENEMA TECHNIQUE: Initial scout AP supine abdominal image obtained to insure adequate colon cleansing. Barium was introduced into the colon in a retrograde fashion and refluxed from the rectum to the cecum. Spot images of the colon followed by overhead radiographs were obtained. FLUOROSCOPY TIME:  Fluoroscopy Time:  6 minutes 42 seconds Radiation Exposure Index (if provided by the fluoroscopic device): Number of Acquired Spot Images: 2 COMPARISON:  Abdominal radiographs 04/07/2016 and earlier. CT Abdomen and Pelvis 04/02/2016. FINDINGS: Preprocedural scout view of the abdomen and pelvis demonstrates persistent dilated small and large bowel loops. The rectum was carefully cannulated, and water-soluble contrast was instilled via gravity. However, the balloon on the initial rectal catheter would not stay inflated. After contrast filling of the rectum and a redundant sigmoid, contrast only leaked from the anal verge.  After several attempts at re- inflating the balloon the initial catheter was removed and a second rectal catheter placed. With further contrast filling of the rectum and sigmoid colon an obstruction to the retrograde flow of contrast was encounter did at the junction of the descending and sigmoid colon segments (series  15). After repositioning the patient and re- attempting contrast installation contrast was successfully refluxed proximal to this obstruction. Ultimately the entire left colon, transverse colon, and right colon were able to be opacified. However, a short segment of severe irregularity and high-grade appearing stenosis persisted at the junction of the left and sigmoid colons. See series 27 and 28. On re-inspection of the left lower quadrant large bowel on the recent CT Abdomen and Pelvis a corresponding 3-4 cm segment of abnormal large bowel is demonstrated (coronal image 74 of the CT comparison). Where there appears to be bowel wall thickening, narrowing of the lumen, and mild adjacent mesenteric stranding. IMPRESSION: 1. Obstructing lesion at the junction of the descending and sigmoid colon segments. Recommend Colonoscopy. While this might be a benign stricture, the Radiographic and CT appearance of this lesion is suspicious for an obstructing Adenocarcinoma. 2. With effort water-soluble contrast was able to be refluxed past the obstruction into the more proximal colon, and the other large bowel segments appear normal. 3. Study discussed by telephone with Dr. Filbert Berthold on 04/08/2016 At 1430 hours. Electronically Signed   By: Genevie Ann M.D.   On: 04/08/2016 14:58    Medications: . ramelteon  8 mg Oral QHS   . dextrose 5 % and 0.9 % NaCl with KCl 20 mEq/L 125 mL/hr at 04/08/16 2316    Assessment/Plan Abdominal pain, nausea, vomiting and diarrhea SBO with obstructing lesion at junction of descending and sigmoid colon Chronic constipation Hypertension Chronic kidney disease - creatinine rising 2.67 Hypomagnesemia Elevated glucose FEN: IV fluids/NPO ID: NO abx DVT: SCD   Plan:  Flexible sigmoidoscopy this AM.  We will follow with you.   LOS: 6 days    Louis [name] 04/09/2016 [id number]

## 2016-04-09 NOTE — Progress Notes (Signed)
Subjective: Patient had NG tube placed yesterday evening. He states that he feels better since having a tube placed. He continues to have a distended abdomen. He is aware that he has a mass obstructing his colon. He had a sigmoidoscopy with gastroenterology this morning and has been seen by general surgery for further evaluation. He had no additional questions or concerns this morning.  Objective:  Vital signs in last 24 hours: Vitals:   04/09/16 0905 04/09/16 0915 04/09/16 0925 04/09/16 0935  BP: (!) 151/96 (!) 138/94 (!) 140/95 (!) 142/87  Pulse: (!) 111 (!) 107 (!) 104 (!) 107  Resp: (!) 24 (!) 27 (!) 27 (!) 29  Temp: 99.5 F (37.5 C)     TempSrc: Oral     SpO2: 93% 94% 95% 95%  Weight:      Height:       Physical Exam  Constitutional: He is oriented to person, place, and time. He appears well-developed and well-nourished.  HENT:  Head: Normocephalic and atraumatic.  NG tube in place and attached suction  Cardiovascular: Normal rate and regular rhythm.  Exam reveals no gallop and no friction rub.   No murmur heard. Respiratory: Effort normal and breath sounds normal. No respiratory distress. He has no wheezes.  GI: He exhibits distension.  Abdomen distended, bowel sounds hyperactive.  Musculoskeletal: He exhibits no edema.  Neurological: He is alert and oriented to person, place, and time.     Assessment/Plan:  Active Problems:   Ileus (HCC)   Essential hypertension   Acute kidney injury superimposed on chronic kidney disease Stage 2 (HCC)   Enterocolitis  In summary, the patient continues to have a distended abdomen. The patient had a GG enema yesterday which revealed an obstructing mass with an apple core appearance in the proximal sigmoid colon. This radiographic finding is concerning for potential adenocarcinoma. He was taken to the GI suite this morning and a flex sigmoidoscopy was performed. A large obstructing mass in the proximal sigmoid colon was identified and  biopsies were taken. General surgery was consulted and planned to take the patient to the operating room in the next day or two for a partial colectomy and colostomy. His imaging finding and flex sigmoidoscopy findings are consistent with a tumor. 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 We will continue to treat the patient supportively as we await for final pathology and for recommendations by general surgery.  1. Obstructing mass in the proximal sigmoid colon Patient continues to have a distended abdomen. GG enema yesterday revealed an obstructing mass with an apple core appearance. The patient had a flexible sigmoidoscopy performed this morning which showed a large obstructing mass in the proximal sigmoid colon. This mass is most concerning for adenocarcinoma. The patient has not had any screening for colon cancer in the past and denies colonoscopy or fecal occult blood testing. Biopsies were taken and sent for further pathology. Surgery has been consulted and it appears he will have an operation tomorrow that is planned for a partial colectomy with colostomy. -- NPO -- NG attached to suction -- D5 half-normal saline with 20 mEq of potassium added at 125 mL per hour -- Follow-up pathology -- Follow-up gastroenterology recommendation -- Follow-up general surgery recommendation -- Zofran PRN -- Determine proper imaging following surgery and path report for staging if mass is malignant   2. Acute on chronic kidney disease, stable The patient's acute on chronic kidney injury is most likely prerenal in etiology with poor by mouth intake  in the setting of gastroenteritis prior to admission. His most recent creatinine was 2.67. We will continue to monitor his renal function. Currently he is getting IV fluids with D5 half-normal saline with 20 mEq potassium added to 125 mL per hour. -- Maintenance IV fluids as above -- BMP daily  3. Electrolyte imbalance, resolved --Potassium, magnesium  and phosphorus within normal limits   4. Hypertension -- Hold home amlodipine and labetalol  DVT/PE prophylaxis: SCDs FEN/GI:Clear liquid diet     Dispo: Anticipated discharge pending clinical course.  Ophelia Shoulder, MD 04/09/2016, 12:58 PM Pager: [phone]

## 2016-04-09 NOTE — Op Note (Signed)
[facility] Patient Name: Louis [name] Procedure Date : 04/09/2016 MRN: [id number] Attending MD: Ladene Artist , MD Date of Birth: [birth year] CSN: [id number] Age: 70 Admit Type: Inpatient Procedure:                Flexible Sigmoidoscopy Indications:              Abnormal gastrograffin enema, LBO Providers:                Pricilla Riffle. Fuller Plan, MD, Cleda Daub, RN, Cherylynn Ridges, Technician Referring MD:             Oval Linsey, MD Medicines:                Fentanyl 50 micrograms IV, Midazolam 4 mg IV Complications:            No immediate complications. Estimated Blood Loss:     Estimated blood loss was minimal. Procedure:                Pre-Anesthesia Assessment:                           - Prior to the procedure, a History and Physical                            was performed, and patient medications and                            allergies were reviewed. The patient's tolerance of                            previous anesthesia was also reviewed. The risks                            and benefits of the procedure and the sedation                            options and risks were discussed with the patient.                            All questions were answered, and informed consent                            was obtained. Prior Anticoagulants: The patient has                            taken no previous anticoagulant or antiplatelet                            agents. ASA Grade Assessment: II - A patient with                            mild systemic disease. After reviewing the risks  and benefits, the patient was deemed in                            satisfactory condition to undergo the procedure.                           After obtaining informed consent, the scope was                            passed under direct vision. The EC-[id number] ([id number])                            scope was introduced through the anus and  advanced                            to the the descending colon. The flexible                            sigmoidoscopy was accomplished without difficulty.                            The patient tolerated the procedure well. The                            quality of the bowel preparation was good. Scope In: 8:40:25 AM Scope Out: 8:55:37 AM Total Procedure Duration: 0 hours 15 minutes 12 seconds  Findings:      The perianal and digital rectal examinations were normal.      A fungating completely obstructing mass was found in the descending       colon. Unable to advance the scope past the mass. The mass was       circumferential. No bleeding was present. This was biopsied with a cold       forceps for histology. Area was tattooed with an injection of 2 mL of       Spot (carbon black) several cm distal to the mass.      Internal hemorrhoids were found during retroflexion. The hemorrhoids       were small and Grade I (internal hemorrhoids that do not prolapse).      The exam was otherwise without abnormality. 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 Biopsied.                           - Internal hemorrhoids.                           - The examination was otherwise normal. Moderate Sedation:      Moderate (conscious) sedation was administered by the endoscopy nurse       and supervised by the endoscopist. The following parameters were       monitored: oxygen saturation, heart rate, blood pressure, respiratory       rate,  EKG, adequacy of pulmonary ventilation, and response to care.       Total physician intraservice time was 21 minutes. Recommendation:           - Patient has a contact number available for                            emergencies. The signs and symptoms of potential                            delayed complications were discussed with the                            patient. Return to  normal activities tomorrow.                            Written discharge instructions were provided to the                            patient.                           - NPO.                           - Return patient to hospital ward for ongoing care.                           - Await pathology results.                           - Colonoscopy in 1 year Procedure Code(s):        --- Professional ---                           [phone], Sigmoidoscopy, flexible; with biopsy, single                            or multiple                           45335, Sigmoidoscopy, flexible; with directed                            submucosal injection(s), any substance                           99152, Moderate sedation services provided by the                            same physician or other qualified health care                            professional performing the diagnostic or                            therapeutic service that the sedation supports,  requiring the presence of an independent trained                            observer to assist in the monitoring of the                            patient's level of consciousness and physiological                            status; initial 15 minutes of intraservice time,                            patient age 76 years or older Diagnosis Code(s):        --- Professional ---                           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 All rights reserved. The codes documented in this report are preliminary and upon coder review may  be revised to meet current compliance requirements. Ladene Artist, MD 04/09/2016 9:03:15 AM This report has been signed electronically. Number of  Addenda: 0

## 2016-04-09 NOTE — Consult Note (Signed)
[facility] CM Primary Care Navigator  04/09/2016  Louis [name] 07/17/1945 [id number]    Met with patient, wife (Anne) and daughter ([facility] RN) at the bedside to identify possible discharge needs. Patient reports that worsening abdominal pain had led to this admission. Patient endorses Dr. Joseph Coladonato with [facility] as his primary provider. Encouraged patient to have his primary care provider and explained importance/ benefits of having one. Patient and wife stated they will look into that matter.   Patient shared using [facility] pharmacy in [place] to obtain medications without any problem.   Patient reports that he had been able to manage his own medications at home straight out of the containers.   Wife will provide transportation to his doctors' appointments after discharge.  Wife and daughter (RN) are the primary caregivers at home as stated.  Discharge plan is anticipated to home with wife and daughter's assistance according to patient.  Patient and wife voiced understanding to contact a primary care provider to establish care and set-up a post discharge follow-up appointment when he gets home. Patient letter provided as a reminder.  Both patient and wife denied any further needs or concerns at this time.  For additional questions please contact:  Lorraine A. Ajel, BSN, RN-BC [facility] PRIMARY CARE Navigator Cell: [phone] 

## 2016-04-09 NOTE — Progress Notes (Signed)
Administered tap water enema 1000 cc to patient and pt. Tolerated well the procedure and went to bathroom to urinate and have a loose medium BM.  Patient back to bed resting and requested some ice chips.

## 2016-04-09 NOTE — Interval H&P Note (Signed)
History and Physical Interval Note:  04/09/2016 8:29 AM  Louis [name]  has presented today for surgery, with the diagnosis of sigmoid mass and bowel obstruction.  The various methods of treatment have been discussed with the patient and family. After consideration of risks, benefits and other options for treatment, the patient has consented to  Procedure(s): FLEXIBLE SIGMOIDOSCOPY (N/A) as a surgical intervention .  The patient's history has been reviewed, patient examined, no change in status, stable for surgery.  I have reviewed the patient's chart and labs.  Questions were answered to the patient's satisfaction.     Pricilla Riffle. Fuller Plan

## 2016-04-09 NOTE — Consult Note (Addendum)
[facility] Nurse requested for preoperative colostomy stoma site marking; pt plans for surgery tomorrow.   Discussed surgical procedure and stoma creation with patient.  Explained role of the [facility] nurse team.  Demonstrated pouch appearance and answered patient's questions. Assessed patient while lying, sitting, and standing.  His abd is extremely distended. Placed the marking in the patient's visual field, away from any creases or abdominal contour issues and within the rectus muscle.  Unable to mark below the patient's belt line related to a significant crease which is located to LLQ and should be avoided if possible.   Marked for colostomy in the LUQ  _10___ cm to the left of the umbilicus and _3___SC above the umbilicus.  Patient's abdomen cleansed with CHG wipes at site markings, allowed to air dry prior to marking. [facility] Nurse team will follow up with patient after surgery for continue ostomy care and teaching.  Thank-you,  Julien Girt MSN, [place], [place], [place], [place]

## 2016-04-09 NOTE — H&P (View-Only) (Signed)
Daily Rounding Note  04/08/2016, 10:26 AM  LOS: 5 days   SUBJECTIVE:   Chief complaint:     Large BM after enema yesterday.  Nothing since then, no flatus.  Nausea and emesis "projectile" emesis of bilious material last night.  Also nausea as he tried to recline for sleep, so slept in chair.  This AM able to lay flat without nausea.  Still very distended.   OBJECTIVE:         Vital signs in last 24 hours:    Temp:  [97 F (36.1 C)-100.5 F (38.1 C)] 97 F (36.1 C) (12/05 0635) Pulse Rate:  [101] 101 (12/05 0635) Resp:  [16-19] 19 (12/05 0635) BP: (134-153)/(85-89) 134/85 (12/05 0635) SpO2:  [98 %] 98 % (12/05 0635) Last BM Date: 04/07/16 Filed Weights   04/02/16 0724 04/02/16 1720  Weight: 106.1 kg (234 lb) 106.1 kg (234 lb)   General: fairly comfortable, mildly ill looking   Heart: tachy, regular.   Chest: few fine rales in right base, no dyspnea or cough Abdomen: very protuberant/distended and tense.  NT.  BS not audible and no tinkling or high pitched sounds eithter  Extremities: no CCE Neuro/Psych:  Alert, oriented x 3.  No gross weakness or deficits  Intake/Output from previous day: 12/04 0701 - 12/05 0700 In: 2388.3 [P.O.:800; I.V.:1588.3] Out: 800 [Urine:800]  Intake/Output this shift: Total I/O In: 30 [P.O.:30] Out: -   Lab Results: No results for input(s): WBC, HGB, HCT, PLT in the last 72 hours. BMET  Recent Labs  04/06/16 1542 04/07/16 0447 04/08/16 0632  NA 136 131* 134*  K 3.8 3.3* 3.8  CL 106 101 104  CO2 22 20* 18*  GLUCOSE 133* 159* 165*  BUN 19 19 25*  CREATININE 2.18* 2.18* 2.67*  CALCIUM 9.0 8.7* 9.0   LFT  Recent Labs  04/05/16 1521 04/06/16 1542  ALBUMIN 2.8* 2.7*   TSH   1.77    Studies/Results: Dg Abd 2 Views  Result Date: 04/07/2016 CLINICAL DATA:  Ileus. EXAM: ABDOMEN - 2 VIEW COMPARISON:  04/04/2016 FINDINGS: There is persistent prominent gaseous dilatation  diffusely involving small bowel an at least a portion of the colon. The degree of bowel dilatation is stable to minimally increased compared to the prior study. Distal colonic/ rectal contrast on the prior study is no longer seen. No intraperitoneal free air is identified. No acute osseous abnormality is seen. IMPRESSION: 1. Persistent diffuse dilatation of small and large bowel loops suggestive of ileus. 2. Interval passage of residual GI contrast material. Electronically Signed   By: Logan Bores M.D.   On: 04/07/2016 17:57   Scheduled Meds: . enoxaparin (LOVENOX) injection  40 mg Subcutaneous Q24H  . magnesium sulfate 1 - 4 g bolus IVPB  1 g Intravenous Once  . metoCLOPramide (REGLAN) injection  10 mg Intravenous Q6H  . ramelteon  8 mg Oral QHS   Continuous Infusions: . dextrose 5 % and 0.9 % NaCl with KCl 20 mEq/L 100 mL/hr at 04/08/16 1021   PRN Meds:.iopamidol, ondansetron **OR** ondansetron (ZOFRAN) IV  ASSESMENT:   *  Ileus vs PSBO.   Ongoing distention and sxs.  For GG enema today *  Inguinal hernia, right.    *  Electrolyte derangement: K, mag, phos.  K corrected, Mag remains depressed despite dose of Mag oxide, so mag sulfate IV given this AM.    Phos not rechecked today, got K phos yesterday.    *  CKD.  AKI worse.    *  Hypoproteinemia.     PLAN   *  Await finding of GG enema.  Suspect he needs NGT but wait for GGE findings. .    *  Electrolytes and BMET in AM.    *  Up IVF to 150/hour x 6 hours, then to 125/hour.    Azucena Freed  04/08/2016, 10:26 AM Pager: [phone]     Attending physician's note   I have taken an interval history, reviewed the chart and examined the patient. I agree with the Advanced Practitioner's note, impression and recommendations. Abd films show a persistent SB and colonic ileus. Had vomiting last night and abdomen remains distended with absent BS today. Continue current mgmt. NGT to LIS if not improved today after GG enema.    Lucio Edward, MD [facility] [phone] Mon-Fri 8a-5p [phone] after 5p, weekends, holidays

## 2016-04-10 ENCOUNTER — Inpatient Hospital Stay (HOSPITAL_COMMUNITY): Payer: Medicare Other | Admitting: Certified Registered Nurse Anesthetist

## 2016-04-10 ENCOUNTER — Encounter (HOSPITAL_COMMUNITY): Payer: Self-pay | Admitting: Gastroenterology

## 2016-04-10 ENCOUNTER — Encounter (HOSPITAL_COMMUNITY)
Admission: EM | Disposition: A | Discharge status: Home Health | Payer: Self-pay | Source: Home / Self Care | Attending: Internal Medicine

## 2016-04-10 DIAGNOSIS — K4090 Unilateral inguinal hernia, without obstruction or gangrene, not specified as recurrent: Secondary | ICD-10-CM

## 2016-04-10 DIAGNOSIS — I708 Atherosclerosis of other arteries: Secondary | ICD-10-CM

## 2016-04-10 DIAGNOSIS — C187 Malignant neoplasm of sigmoid colon: Secondary | ICD-10-CM | POA: Diagnosis present

## 2016-04-10 DIAGNOSIS — I700 Atherosclerosis of aorta: Secondary | ICD-10-CM | POA: Diagnosis present

## 2016-04-10 DIAGNOSIS — C189 Malignant neoplasm of colon, unspecified: Secondary | ICD-10-CM | POA: Diagnosis present

## 2016-04-10 DIAGNOSIS — R7301 Impaired fasting glucose: Secondary | ICD-10-CM | POA: Diagnosis present

## 2016-04-10 DIAGNOSIS — K439 Ventral hernia without obstruction or gangrene: Secondary | ICD-10-CM

## 2016-04-10 HISTORY — PX: BIOPSY: SHX5522

## 2016-04-10 HISTORY — PX: PARTIAL COLECTOMY: SHX5273

## 2016-04-10 LAB — CBC
HEMATOCRIT: 33.1 % — AB (ref 39.0–52.0)
HEMOGLOBIN: 11 g/dL — AB (ref 13.0–17.0)
MCH: 28 pg (ref 26.0–34.0)
MCHC: 33.2 g/dL (ref 30.0–36.0)
MCV: 84.2 fL (ref 78.0–100.0)
Platelets: 162 10*3/uL (ref 150–400)
RBC: 3.93 MIL/uL — ABNORMAL LOW (ref 4.22–5.81)
RDW: 16.1 % — AB (ref 11.5–15.5)
WBC: 8.1 10*3/uL (ref 4.0–10.5)

## 2016-04-10 LAB — BASIC METABOLIC PANEL
ANION GAP: 10 (ref 5–15)
BUN: 27 mg/dL — AB (ref 6–20)
CHLORIDE: 107 mmol/L (ref 101–111)
CO2: 25 mmol/L (ref 22–32)
Calcium: 8.6 mg/dL — ABNORMAL LOW (ref 8.9–10.3)
Creatinine, Ser: 2.58 mg/dL — ABNORMAL HIGH (ref 0.61–1.24)
GFR calc Af Amer: 28 mL/min — ABNORMAL LOW (ref >60)
GFR, EST NON AFRICAN AMERICAN: 24 mL/min — AB (ref >60)
GLUCOSE: 152 mg/dL — AB (ref 65–99)
POTASSIUM: 4.6 mmol/L (ref 3.5–5.1)
Sodium: 142 mmol/L (ref 135–145)

## 2016-04-10 LAB — SURGICAL PCR SCREEN
MRSA, PCR: NEGATIVE
STAPHYLOCOCCUS AUREUS: NEGATIVE

## 2016-04-10 SURGERY — COLECTOMY, PARTIAL
Anesthesia: General | Site: Abdomen

## 2016-04-10 MED ORDER — NALOXONE HCL 0.4 MG/ML IJ SOLN: 0.4000 mg | INTRAMUSCULAR | Status: DC | PRN

## 2016-04-10 MED ORDER — SUCCINYLCHOLINE CHLORIDE 20 MG/ML IJ SOLN
INTRAMUSCULAR | Status: DC | PRN
  Administered 2016-04-10: 120 mg via INTRAVENOUS

## 2016-04-10 MED ORDER — ROCURONIUM BROMIDE 100 MG/10ML IV SOLN
INTRAVENOUS | Status: DC | PRN
  Administered 2016-04-10: 50 mg via INTRAVENOUS

## 2016-04-10 MED ORDER — HYDROMORPHONE 1 MG/ML IV SOLN
INTRAVENOUS | Status: AC
  Filled 2016-04-10: qty 25

## 2016-04-10 MED ORDER — FENTANYL CITRATE (PF) 100 MCG/2ML IJ SOLN
INTRAMUSCULAR | Status: AC
  Filled 2016-04-10: qty 2

## 2016-04-10 MED ORDER — PROPOFOL 10 MG/ML IV BOLUS
INTRAVENOUS | Status: DC | PRN
  Administered 2016-04-10: 150 mg via INTRAVENOUS

## 2016-04-10 MED ORDER — MIDAZOLAM HCL 5 MG/5ML IJ SOLN
INTRAMUSCULAR | Status: DC | PRN
  Administered 2016-04-10: 2 mg via INTRAVENOUS

## 2016-04-10 MED ORDER — ALBUMIN HUMAN 5 % IV SOLN: 12.5000 g | Freq: Once | INTRAVENOUS | Status: DC

## 2016-04-10 MED ORDER — OXYCODONE HCL 5 MG/5ML PO SOLN: 5.0000 mg | Freq: Once | ORAL | Status: DC | PRN

## 2016-04-10 MED ORDER — ONDANSETRON HCL 4 MG/2ML IJ SOLN: 4.0000 mg | Freq: Four times a day (QID) | INTRAMUSCULAR | Status: DC | PRN

## 2016-04-10 MED ORDER — FENTANYL CITRATE (PF) 100 MCG/2ML IJ SOLN
INTRAMUSCULAR | Status: AC
  Filled 2016-04-10: qty 4

## 2016-04-10 MED ORDER — CEFOTETAN DISODIUM-DEXTROSE 2-2.08 GM-% IV SOLR
INTRAVENOUS | Status: AC
  Filled 2016-04-10: qty 50

## 2016-04-10 MED ORDER — LABETALOL HCL 5 MG/ML IV SOLN
5.0000 mg | INTRAVENOUS | Status: DC | PRN
  Administered 2016-04-10 (×2): 5 mg via INTRAVENOUS

## 2016-04-10 MED ORDER — POVIDONE-IODINE 10 % EX OINT
TOPICAL_OINTMENT | CUTANEOUS | Status: AC
  Filled 2016-04-10: qty 28.35

## 2016-04-10 MED ORDER — PHENYLEPHRINE HCL 10 MG/ML IJ SOLN
INTRAVENOUS | Status: DC | PRN
  Administered 2016-04-10: 20 ug/min via INTRAVENOUS

## 2016-04-10 MED ORDER — ONDANSETRON HCL 4 MG/2ML IJ SOLN: 4.0000 mg | Freq: Once | INTRAMUSCULAR | Status: DC | PRN

## 2016-04-10 MED ORDER — NEOSTIGMINE METHYLSULFATE 10 MG/10ML IV SOLN
INTRAVENOUS | Status: DC | PRN
  Administered 2016-04-10: 4 mg via INTRAVENOUS

## 2016-04-10 MED ORDER — OXYCODONE HCL 5 MG PO TABS: 5.0000 mg | ORAL_TABLET | Freq: Once | ORAL | Status: DC | PRN

## 2016-04-10 MED ORDER — HYDROMORPHONE HCL 1 MG/ML IJ SOLN
INTRAMUSCULAR | Status: AC
Start: 2016-04-10 — End: 2016-04-11
  Filled 2016-04-10: qty 1

## 2016-04-10 MED ORDER — DEXTROSE-NACL 5-0.45 % IV SOLN
INTRAVENOUS | Status: AC
  Administered 2016-04-10 – 2016-04-12 (×5): via INTRAVENOUS

## 2016-04-10 MED ORDER — GLYCOPYRROLATE 0.2 MG/ML IJ SOLN
INTRAMUSCULAR | Status: DC | PRN
  Administered 2016-04-10: 0.6 mg via INTRAVENOUS

## 2016-04-10 MED ORDER — DEXAMETHASONE SODIUM PHOSPHATE 10 MG/ML IJ SOLN
INTRAMUSCULAR | Status: AC
  Filled 2016-04-10: qty 1

## 2016-04-10 MED ORDER — DIPHENHYDRAMINE HCL 12.5 MG/5ML PO ELIX: 12.5000 mg | ORAL_SOLUTION | Freq: Four times a day (QID) | ORAL | Status: DC | PRN

## 2016-04-10 MED ORDER — MIDAZOLAM HCL 2 MG/2ML IJ SOLN
INTRAMUSCULAR | Status: AC
  Filled 2016-04-10: qty 2

## 2016-04-10 MED ORDER — SODIUM CHLORIDE 0.9 % IV SOLN
INTRAVENOUS | Status: DC
  Administered 2016-04-10: 13:00:00 via INTRAVENOUS

## 2016-04-10 MED ORDER — DEXAMETHASONE SODIUM PHOSPHATE 10 MG/ML IJ SOLN
INTRAMUSCULAR | Status: DC | PRN
  Administered 2016-04-10: 10 mg via INTRAVENOUS

## 2016-04-10 MED ORDER — HYDROMORPHONE 1 MG/ML IV SOLN
INTRAVENOUS | Status: DC
  Administered 2016-04-10: 1.1 mg via INTRAVENOUS
  Administered 2016-04-10: 17:00:00 via INTRAVENOUS
  Administered 2016-04-11: 0.6 mg via INTRAVENOUS
  Administered 2016-04-11: 2.4 mg via INTRAVENOUS
  Administered 2016-04-11: 0.9 mg via INTRAVENOUS
  Administered 2016-04-11 (×2): 1.2 mg via INTRAVENOUS
  Administered 2016-04-11: 0.6 mg via INTRAVENOUS
  Administered 2016-04-12 (×2): 0.9 mg via INTRAVENOUS

## 2016-04-10 MED ORDER — HYDROMORPHONE HCL 1 MG/ML IJ SOLN
0.2500 mg | INTRAMUSCULAR | Status: DC | PRN
  Administered 2016-04-10 (×3): 0.5 mg via INTRAVENOUS

## 2016-04-10 MED ORDER — LACTATED RINGERS IV SOLN
INTRAVENOUS | Status: DC | PRN
  Administered 2016-04-10 (×2): via INTRAVENOUS

## 2016-04-10 MED ORDER — HYDROMORPHONE HCL 1 MG/ML IJ SOLN
INTRAMUSCULAR | Status: AC
  Filled 2016-04-10: qty 0.5

## 2016-04-10 MED ORDER — SODIUM CHLORIDE 0.9% FLUSH: 9.0000 mL | INTRAVENOUS | Status: DC | PRN

## 2016-04-10 MED ORDER — WHITE PETROLATUM GEL
Status: AC
  Administered 2016-04-10: 1
  Filled 2016-04-10: qty 1

## 2016-04-10 MED ORDER — 0.9 % SODIUM CHLORIDE (POUR BTL) OPTIME
TOPICAL | Status: DC | PRN
  Administered 2016-04-10 (×2): 1000 mL

## 2016-04-10 MED ORDER — DIPHENHYDRAMINE HCL 50 MG/ML IJ SOLN: 12.5000 mg | Freq: Four times a day (QID) | INTRAMUSCULAR | Status: DC | PRN

## 2016-04-10 MED ORDER — FENTANYL CITRATE (PF) 100 MCG/2ML IJ SOLN
INTRAMUSCULAR | Status: DC | PRN
  Administered 2016-04-10 (×6): 50 ug via INTRAVENOUS

## 2016-04-10 MED ORDER — LABETALOL HCL 5 MG/ML IV SOLN
INTRAVENOUS | Status: AC
  Filled 2016-04-10: qty 4

## 2016-04-10 MED ORDER — LIDOCAINE HCL (CARDIAC) 20 MG/ML IV SOLN
INTRAVENOUS | Status: DC | PRN
  Administered 2016-04-10: 50 mg via INTRAVENOUS

## 2016-04-10 SURGICAL SUPPLY — 53 items
BLADE SURG ROTATE 9660 (MISCELLANEOUS) ×4 IMPLANT
CANISTER SUCTION 2500CC (MISCELLANEOUS) ×4 IMPLANT
CHLORAPREP W/TINT 26ML (MISCELLANEOUS) ×4 IMPLANT
COVER MAYO STAND STRL (DRAPES) ×8 IMPLANT
COVER SURGICAL LIGHT HANDLE (MISCELLANEOUS) ×4 IMPLANT
DRAPE LAPAROSCOPIC ABDOMINAL (DRAPES) ×4 IMPLANT
DRAPE PROXIMA HALF (DRAPES) ×8 IMPLANT
DRAPE UTILITY XL STRL (DRAPES) ×20 IMPLANT
DRAPE WARM FLUID 44X44 (DRAPE) ×4 IMPLANT
DRSG OPSITE POSTOP 4X10 (GAUZE/BANDAGES/DRESSINGS) ×4 IMPLANT
DRSG OPSITE POSTOP 4X8 (GAUZE/BANDAGES/DRESSINGS) IMPLANT
ELECT BLADE 6.5 EXT (BLADE) ×4 IMPLANT
ELECT CAUTERY BLADE 6.4 (BLADE) ×8 IMPLANT
ELECT REM PT RETURN 9FT ADLT (ELECTROSURGICAL) ×4
ELECTRODE REM PT RTRN 9FT ADLT (ELECTROSURGICAL) ×2 IMPLANT
GLOVE BIOGEL PI IND STRL 8 (GLOVE) ×4 IMPLANT
GLOVE BIOGEL PI INDICATOR 8 (GLOVE) ×4
GLOVE ECLIPSE 7.5 STRL STRAW (GLOVE) ×8 IMPLANT
GOWN STRL REUS W/ TWL LRG LVL3 (GOWN DISPOSABLE) ×12 IMPLANT
GOWN STRL REUS W/TWL LRG LVL3 (GOWN DISPOSABLE) ×12
KIT BASIN OR (CUSTOM PROCEDURE TRAY) ×4 IMPLANT
KIT OSTOMY DRAINABLE 2.75 STR (WOUND CARE) ×4 IMPLANT
KIT ROOM TURNOVER OR (KITS) ×4 IMPLANT
LEGGING LITHOTOMY PAIR STRL (DRAPES) IMPLANT
LIGASURE IMPACT 36 18CM CVD LR (INSTRUMENTS) IMPLANT
NS IRRIG 1000ML POUR BTL (IV SOLUTION) ×8 IMPLANT
PACK GENERAL/GYN (CUSTOM PROCEDURE TRAY) ×4 IMPLANT
PAD ARMBOARD 7.5X6 YLW CONV (MISCELLANEOUS) ×4 IMPLANT
PENCIL BUTTON HOLSTER BLD 10FT (ELECTRODE) ×4 IMPLANT
RELOAD PROXIMATE 75MM BLUE (ENDOMECHANICALS) ×4 IMPLANT
SPECIMEN JAR X LARGE (MISCELLANEOUS) ×4 IMPLANT
SPONGE LAP 18X18 X RAY DECT (DISPOSABLE) IMPLANT
STAPLER PROXIMATE 75MM BLUE (STAPLE) ×4 IMPLANT
STAPLER VISISTAT 35W (STAPLE) ×4 IMPLANT
SUCTION POOLE TIP (SUCTIONS) ×4 IMPLANT
SURGILUBE 2OZ TUBE FLIPTOP (MISCELLANEOUS) IMPLANT
SUT PDS AB 1 TP1 96 (SUTURE) ×8 IMPLANT
SUT PROLENE 2 0 CT2 30 (SUTURE) ×4 IMPLANT
SUT PROLENE 2 0 KS (SUTURE) IMPLANT
SUT SILK 2 0 SH CR/8 (SUTURE) ×4 IMPLANT
SUT SILK 2 0 TIES 10X30 (SUTURE) ×4 IMPLANT
SUT SILK 3 0 SH CR/8 (SUTURE) ×4 IMPLANT
SUT SILK 3 0 TIES 10X30 (SUTURE) ×4 IMPLANT
SUT VIC AB 3-0 SH 8-18 (SUTURE) ×4 IMPLANT
SYR BULB IRRIGATION 50ML (SYRINGE) ×4 IMPLANT
TOWEL OR 17X26 10 PK STRL BLUE (TOWEL DISPOSABLE) ×8 IMPLANT
TRAY FOLEY CATH 14FRSI W/METER (CATHETERS) ×4 IMPLANT
TRAY PROCTOSCOPIC FIBER OPTIC (SET/KITS/TRAYS/PACK) IMPLANT
TUBE CONNECTING 12'X1/4 (SUCTIONS) ×1
TUBE CONNECTING 12X1/4 (SUCTIONS) ×3 IMPLANT
UNDERPAD 30X30 (UNDERPADS AND DIAPERS) IMPLANT
WATER STERILE IRR 1000ML POUR (IV SOLUTION) IMPLANT
YANKAUER SUCT BULB TIP NO VENT (SUCTIONS) ×4 IMPLANT

## 2016-04-10 NOTE — Progress Notes (Signed)
1 Day Post-Op  Subjective: OK this AM, still very distended, but not overly uncomfortable.  Anxious to know pathology, and anxious over surgery.    Objective: Vital signs in last 24 hours: Temp:  [98 F (36.7 C)-99.5 F (37.5 C)] 99.4 F (37.4 C) (12/07 0430) Pulse Rate:  [96-113] 96 (12/07 0430) Resp:  [20-45] 20 (12/07 0430) BP: (119-168)/(60-101) 119/60 (12/07 0430) SpO2:  [92 %-97 %] 94 % (12/07 0430) Last BM Date: 04/09/16 150 PO yesterday 2900 IV fluids 2525 urine NG 2050 Afebrile, VSS Creatinine is still up other labs stable Flex Sig 04/09/16: A fungating completely obstructing mass was found in the descending       colon. Unable to advance the scope past the mass. The mass was       circumferential. No bleeding was present. This was biopsied with a cold       forceps for histology. Area was tattooed with an injection of 2 mL of       Spot (carbon black) several cm distal to the mass. Intake/Output from previous day: 12/06 0701 - 12/07 0700 In: 3075 [P.O.:150; I.V.:2925] Out: 4575 [Urine:2525; Emesis/NG output:2050] Intake/Output this shift: No intake/output data recorded.  General appearance: alert, cooperative and no distress GI: very distended but not overly uncomfortable with it.  NG working well.  Lab Results:   Recent Labs  04/09/16 1045 04/10/16 0459  WBC 9.3 8.1  HGB 12.0* 11.0*  HCT 36.0* 33.1*  PLT 208 162    BMET  Recent Labs  04/09/16 0644 04/10/16 0459  NA 139 142  K 4.2 4.6  CL 105 107  CO2 28 25  GLUCOSE 169* 152*  BUN 27* 27*  CREATININE 2.67* 2.58*  CALCIUM 8.9 8.6*   PT/INR No results for input(s): LABPROT, INR in the last 72 hours.   Recent Labs Lab 04/05/16 1521 04/06/16 1542  ALBUMIN 2.8* 2.7*     Lipase     Component Value Date/Time   LIPASE 19 04/02/2016 0719     Studies/Results: Dg Colon W/water Sol Cm  Result Date: 04/08/2016 CLINICAL DATA:  70 year old male with unresolved abdominal distension due to  widespread small and large bowel gaseous distention. Still, there was slow transit of oral contrast administered for the CT on 04/02/2016, which did cleared the intestinal system by twelfth 08/2015. Water-soluble enema requested to exclude obstruction and/or constipation. EXAM: SINGLE COLUMN BARIUM ENEMA TECHNIQUE: Initial scout AP supine abdominal image obtained to insure adequate colon cleansing. Barium was introduced into the colon in a retrograde fashion and refluxed from the rectum to the cecum. Spot images of the colon followed by overhead radiographs were obtained. FLUOROSCOPY TIME:  Fluoroscopy Time:  6 minutes 42 seconds Radiation Exposure Index (if provided by the fluoroscopic device): Number of Acquired Spot Images: 2 COMPARISON:  Abdominal radiographs 04/07/2016 and earlier. CT Abdomen and Pelvis 04/02/2016. FINDINGS: Preprocedural scout view of the abdomen and pelvis demonstrates persistent dilated small and large bowel loops. The rectum was carefully cannulated, and water-soluble contrast was instilled via gravity. However, the balloon on the initial rectal catheter would not stay inflated. After contrast filling of the rectum and a redundant sigmoid, contrast only leaked from the anal verge. After several attempts at re- inflating the balloon the initial catheter was removed and a second rectal catheter placed. With further contrast filling of the rectum and sigmoid colon an obstruction to the retrograde flow of contrast was encounter did at the junction of the descending and sigmoid colon segments (  series 15). After repositioning the patient and re- attempting contrast installation contrast was successfully refluxed proximal to this obstruction. Ultimately the entire left colon, transverse colon, and right colon were able to be opacified. However, a short segment of severe irregularity and high-grade appearing stenosis persisted at the junction of the left and sigmoid colons. See series 27 and 28. On  re-inspection of the left lower quadrant large bowel on the recent CT Abdomen and Pelvis a corresponding 3-4 cm segment of abnormal large bowel is demonstrated (coronal image 74 of the CT comparison). Where there appears to be bowel wall thickening, narrowing of the lumen, and mild adjacent mesenteric stranding. IMPRESSION: 1. Obstructing lesion at the junction of the descending and sigmoid colon segments. Recommend Colonoscopy. While this might be a benign stricture, the Radiographic and CT appearance of this lesion is suspicious for an obstructing Adenocarcinoma. 2. With effort water-soluble contrast was able to be refluxed past the obstruction into the more proximal colon, and the other large bowel segments appear normal. 3. Study discussed by telephone with Dr. Filbert Berthold on 04/08/2016 At 1430 hours. Electronically Signed   By: Genevie Ann M.D.   On: 04/08/2016 14:58    Medications: . cefoTEtan (CEFOTAN) IV  1 g Intravenous To OR  . ramelteon  8 mg Oral QHS    Assessment/Plan Abdominal pain, nausea, vomiting and diarrhea SBO with obstructing lesion at junction of descending and sigmoid colon Chronic constipation Hypertension Chronic kidney disease - creatinine rising 2.58 today Hypomagnesemia - resolved Elevated glucose FEN: IV fluids/NPO ID: NO abx DVT: SCD   Plan:  OR later today.  I have ordered an A1C to be sure elevated glucose is a stress issue.    LOS: 7 days    Marlo Arriola 04/10/2016 [phone]'

## 2016-04-10 NOTE — Anesthesia Preprocedure Evaluation (Signed)
Anesthesia Evaluation  Patient identified by MRN, date of birth, ID band Patient awake    Reviewed: Allergy & Precautions, H&P , NPO status , Patient's Chart, lab work & pertinent test results  Airway Mallampati: II   Neck ROM: full    Dental   Pulmonary neg pulmonary ROS,    breath sounds clear to auscultation       Cardiovascular hypertension, + Peripheral Vascular Disease   Rhythm:regular Rate:Normal     Neuro/Psych    GI/Hepatic   Endo/Other    Renal/GU Renal InsufficiencyRenal disease     Musculoskeletal   Abdominal   Peds  Hematology   Anesthesia Other Findings   Reproductive/Obstetrics                             Anesthesia Physical Anesthesia Plan  ASA: III  Anesthesia Plan: General   Post-op Pain Management:    Induction: Intravenous  Airway Management Planned: Oral ETT  Additional Equipment:   Intra-op Plan:   Post-operative Plan: Extubation in OR  Informed Consent: I have reviewed the patients History and Physical, chart, labs and discussed the procedure including the risks, benefits and alternatives for the proposed anesthesia with the patient or authorized representative who has indicated his/her understanding and acceptance.     Plan Discussed with: CRNA, Anesthesiologist and Surgeon  Anesthesia Plan Comments:         Anesthesia Quick Evaluation

## 2016-04-10 NOTE — Op Note (Signed)
OPERATIVE REPORT  DATE OF OPERATION:  04/10/2016  PATIENT:  Louis [name]  70 y.o. male  PRE-OPERATIVE DIAGNOSIS:  obstructing colon mass  POST-OPERATIVE DIAGNOSIS:  obstructing colon mass. 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  INDICATION(S) FOR OPERATION:  Obstructing colon tumor in the distal descending colon  FINDINGS:  Single descending colon mesenteric implant sent as separate specimen.  Completely obstructing distal descending colonic tumor, marked colonic and small bowel distention with aspiration of 2.4 L of gastric output and 1.5 L of colon output  PROCEDURE:  Procedure(s): SIGMOID  COLECTOMY WITH COLOSTOMY BIOPSY Mesentary  SURGEON:  Surgeon(s): Judeth Horn, MD Georganna Skeans, MD  ASSISTANT: Grandville Silos, MD  ANESTHESIA:   general  COMPLICATIONS:  None  EBL: <50 ml  BLOOD ADMINISTERED: none  DRAINS: Nasogastric Tube and Urinary Catheter (Foley)   SPECIMEN:  Source of Specimen:  Mesenteric biopsy, descending colon and proximal sigmoid colon, Additional proximal margin  COUNTS CORRECT:  YES  PROCEDURE DETAILS: The patient was taken to the operating room and placed on the table in the supine position. After an adequate general endotracheal anesthetic was administered he was prepped and draped in the usual sterile manner exposing his abdomen.  A proper timeout was performed identifying the patient and the procedure to be performed.  A midline incision was made just to the right of the umbilicus and taken down to the lower abdominal wall. It was taken down to and through the subcutaneous tissue down to the midline fascia. We incised the fascia with electrocautery where there was a small amount of ascites that was aspirated.  Upon entering the peritoneal cavity the small bowel was markedly distended as was the transverse colon and the cecum. None of these bowel loops appear to be ischemic. We eviscerated the small bowel and covered it with a  wet towel as we identified the distal descending colon and proximal sigmoid colon area that was obstructed and marked with ink from the colonoscopy. We had to take it down from its peritoneal attachments using electrocautery at the line of Toldt. As we were mobilizing the mesentery, a small 2.68mm white implant of what may have been  Tumor was removed ans sent separately as a specimen. We then were able to mobilize the colon so that we could come across it proximally and distally with at least a 10 cm margin using a GIA-75 stapler. The mesentery was taken using a LigaSure device detaching the specimen. The proximal portion of the colon was marked with a suture and sent off the field.  We subsequently irrigated with saline then milked most of the distended small bowel contents back into the stomach through the ligament of Treitz where it was aspirated by the anesthesiologist out of his NG tube. A total of 2.4 L of this bowel aspirate was removed. Once this was done we brought out the proximal portion of the descending colon at the site marked for the stoma on the left side. It was subsequently matured with 3-0 Vicryl sutures. However prior to opening the colon we maturing in marked the very long distal stump with a Prolene suture. This should not be difficult to identify at the time of the colostomy reversal.  We then irrigated with saline solution then closed the abdomen.  Palpation of the liver, gallbladder, spleen, stomach and other parts the mesentery failed to demonstrate any other evidence of tumor. The fascia was closed using running looped #1 PDS suture. The subcutaneous tissue was irrigated with saline.  The skin was closed using stainless steel staples. A sterile dressing was applied. As mentioned previously the colostomy was matured with 3-0 Vicryl sutures after closure of the midline abdomen. A stomal device was applied.  All needle counts, sponge counts, and instrument counts were correct.  PATIENT  DISPOSITION:  PACU - hemodynamically stable.   Louis [name] 12/7/20174:57 PM

## 2016-04-10 NOTE — Progress Notes (Signed)
   Subjective: No new issues overnight. Surgery today.  Objective:  Vital signs in last 24 hours: Vitals:   04/09/16 0935 04/09/16 1458 04/09/16 2219 04/10/16 0430  BP: (!) 142/87 (!) 154/89 119/65 119/60  Pulse: (!) 107 (!) 107 (!) 101 96  Resp: (!) 29 (!) 25 20 20   Temp:  98.6 F (37 C) 98 F (36.7 C) 99.4 F (37.4 C)  TempSrc:  Oral Oral Oral  SpO2: 95% 97% 96% 94%  Weight:      Height:       Physical Exam  Constitutional: He is oriented to person, place, and time. He appears well-developed and well-nourished.  HENT:  Head: Normocephalic and atraumatic.  Ng in place to suction  Cardiovascular: Normal rate and regular rhythm.  Exam reveals no gallop and no friction rub.   No murmur heard. Respiratory: Breath sounds normal. No respiratory distress. He has no wheezes.  GI: He exhibits distension.  + BS, hyperactive  Musculoskeletal: He exhibits no edema.  Neurological: He is alert and oriented to person, place, and time.     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 Obstructing mass in the proximal sigmoid colon Patient plan for surgery this morning for partial colectomy with colostomy. -- NPO -- NG attached to suction -- D5 half-normal saline with 8mEq of potassium added at 125 mL per hour -- Follow-up pathology -- Follow-up gastroenterology recommendation -- Follow-up general surgery recommendation -- Zofran PRN -- Determine proper imaging following surgery and path report for staging if mass is malignant -- Will need oncology consult -- NPO   2. Acute on chronic kidney disease -- Maintenance IV fluids as above -- BMP daily  3. Electrolyte imbalance, resolved --Potassium, magnesium and phosphorus within normal limits   4. Hypertension -- Hold home amlodipine and labetalol  DVT/PE prophylaxis: SCDs FEN/GI:Nothing  by mouth for surgery  Dispo: Anticipated discharge pending medical course.   Ophelia Shoulder, MD 04/10/2016, 7:24 AM Pager: [phone]

## 2016-04-10 NOTE — Transfer of Care (Signed)
Immediate Anesthesia Transfer of Care Note  Patient: Louis [name]  Procedure(s) Performed: Procedure(s): SIGMOID  COLECTOMY WITH COLOSTOMY (N/A) BIOPSY Mesentary  Patient Location: PACU  Anesthesia Type:General  Level of Consciousness: awake, alert , oriented and patient cooperative  Airway & Oxygen Therapy: Patient Spontanous Breathing and Patient connected to nasal cannula oxygen  Post-op Assessment: Report given to RN and Post -op Vital signs reviewed and stable  Post vital signs: Reviewed and stable  Last Vitals:  Vitals:   04/09/16 2219 04/10/16 0430  BP: 119/65 119/60  Pulse: (!) 101 96  Resp: 20 20  Temp: 36.7 C 37.4 C    Last Pain:  Vitals:   04/10/16 0800  TempSrc:   PainSc: 0-No pain      Patients Stated Pain Goal: 2 (52/84/13 2440)  Complications: No apparent anesthesia complications

## 2016-04-10 NOTE — Anesthesia Procedure Notes (Signed)
Procedure Name: Intubation Date/Time: 04/10/2016 3:13 PM Performed by: Rejeana Brock L Pre-anesthesia Checklist: Patient identified, Emergency Drugs available, Suction available and Patient being monitored Patient Re-evaluated:Patient Re-evaluated prior to inductionOxygen Delivery Method: Circle System Utilized Preoxygenation: Pre-oxygenation with 100% oxygen Intubation Type: IV induction Ventilation: Mask ventilation without difficulty Laryngoscope Size: Mac and 4 Grade View: Grade I Tube type: Oral Tube size: 7.5 mm Number of attempts: 1 Airway Equipment and Method: Stylet and Oral airway Placement Confirmation: ETT inserted through vocal cords under direct vision,  positive ETCO2 and breath sounds checked- equal and bilateral Secured at: 22 cm Tube secured with: Tape Dental Injury: Teeth and Oropharynx as per pre-operative assessment

## 2016-04-10 NOTE — Progress Notes (Signed)
Pt has left floor for OR. Pathologist called with result showing adenocarcinoma. Pt not aware of pathology.

## 2016-04-10 NOTE — Progress Notes (Signed)
Patient left for OR at this time.

## 2016-04-11 ENCOUNTER — Encounter (HOSPITAL_COMMUNITY): Payer: Self-pay | Admitting: General Surgery

## 2016-04-11 DIAGNOSIS — Z933 Colostomy status: Secondary | ICD-10-CM

## 2016-04-11 DIAGNOSIS — Z9049 Acquired absence of other specified parts of digestive tract: Secondary | ICD-10-CM

## 2016-04-11 LAB — BASIC METABOLIC PANEL
Anion gap: 11 (ref 5–15)
BUN: 25 mg/dL — ABNORMAL HIGH (ref 6–20)
CHLORIDE: 105 mmol/L (ref 101–111)
CO2: 25 mmol/L (ref 22–32)
CREATININE: 2.74 mg/dL — AB (ref 0.61–1.24)
Calcium: 8.1 mg/dL — ABNORMAL LOW (ref 8.9–10.3)
GFR calc non Af Amer: 22 mL/min — ABNORMAL LOW (ref >60)
GFR, EST AFRICAN AMERICAN: 26 mL/min — AB (ref >60)
GLUCOSE: 168 mg/dL — AB (ref 65–99)
Potassium: 4.5 mmol/L (ref 3.5–5.1)
Sodium: 141 mmol/L (ref 135–145)

## 2016-04-11 LAB — CBC
HCT: 34.4 % — ABNORMAL LOW (ref 39.0–52.0)
HEMOGLOBIN: 11.2 g/dL — AB (ref 13.0–17.0)
MCH: 27.6 pg (ref 26.0–34.0)
MCHC: 32.6 g/dL (ref 30.0–36.0)
MCV: 84.7 fL (ref 78.0–100.0)
PLATELETS: 180 10*3/uL (ref 150–400)
RBC: 4.06 MIL/uL — AB (ref 4.22–5.81)
RDW: 16 % — ABNORMAL HIGH (ref 11.5–15.5)
WBC: 13 10*3/uL — ABNORMAL HIGH (ref 4.0–10.5)

## 2016-04-11 LAB — HEMOGLOBIN A1C
HEMOGLOBIN A1C: 6.3 % — AB (ref 4.8–5.6)
MEAN PLASMA GLUCOSE: 134 mg/dL

## 2016-04-11 MED ORDER — METHOCARBAMOL 1000 MG/10ML IJ SOLN
500.0000 mg | Freq: Three times a day (TID) | INTRAVENOUS | Status: DC
  Administered 2016-04-11: 500 mg via INTRAVENOUS
  Filled 2016-04-11 (×2): qty 5

## 2016-04-11 MED ORDER — LABETALOL HCL 100 MG PO TABS
300.0000 mg | ORAL_TABLET | Freq: Two times a day (BID) | ORAL | Status: DC
  Administered 2016-04-11 – 2016-04-12 (×2): 300 mg via ORAL
  Administered 2016-04-15: 100 mg via ORAL
  Filled 2016-04-11 (×6): qty 3

## 2016-04-11 MED ORDER — METHOCARBAMOL 1000 MG/10ML IJ SOLN
500.0000 mg | Freq: Three times a day (TID) | INTRAMUSCULAR | Status: DC
  Administered 2016-04-11 – 2016-04-14 (×9): 500 mg via INTRAVENOUS
  Filled 2016-04-11 (×13): qty 5

## 2016-04-11 MED ORDER — HYDRALAZINE HCL 20 MG/ML IJ SOLN: 5.0000 mg | Freq: Four times a day (QID) | INTRAMUSCULAR | Status: DC | PRN

## 2016-04-11 MED ORDER — ENOXAPARIN SODIUM 40 MG/0.4ML ~~LOC~~ SOLN
40.0000 mg | SUBCUTANEOUS | Status: DC
  Administered 2016-04-11 – 2016-04-15 (×5): 40 mg via SUBCUTANEOUS
  Filled 2016-04-11 (×5): qty 0.4

## 2016-04-11 MED ORDER — AMLODIPINE BESYLATE 10 MG PO TABS
10.0000 mg | ORAL_TABLET | Freq: Every day | ORAL | Status: DC
  Administered 2016-04-11 – 2016-04-15 (×3): 10 mg via ORAL
  Filled 2016-04-11 (×4): qty 1

## 2016-04-11 NOTE — Consult Note (Signed)
[facility] Nurse ostomy consult note Stoma type/location:  Colostomy to LUQ from surgery yesterday. Stomal assessment/size: Stoma dark red when visualized through the pouch, which is intact with a good seal.  Stoma appears to be flush with skin level Output: Small amt brown liquid stool and flatus in the pouch  Ostomy pouching: 2pc.  Education provided: Pt states he is in pain and does not want to have pouch changed today; informed him [place] team is not available on the weekends, but we will have another teaching session on Monday.  Demonstrated pouch appearance and procedure for cutting the opening, application of a barrier ring to assist with maintaining a seal, and pt was able to open and close the velcro to empty.  He asked appropriate questions and extra supplies left at the bedside for staff nurses to change this weekend if pouch leakage occurs.  Reviewed pouching routines and ordering supplies.  Daughter is a Marine scientist and is familiar with ostomy pouch application.  Pt, wife, and daughter all participated in pouch demonstration and discussion. Enrolled patient in [employer] program: Yes Julien Girt MSN, RN, [place], [place], [place]

## 2016-04-11 NOTE — Care Management Important Message (Signed)
Important Message  Patient Details  Name: Louis [name] MRN: [id number] Date of Birth: 05-10-1945   Medicare Important Message Given:  Yes    [name] 04/11/2016, 12:56 PM

## 2016-04-11 NOTE — Anesthesia Postprocedure Evaluation (Signed)
Anesthesia Post Note  Patient: Louis [name]  Procedure(s) Performed: Procedure(s) (LRB): SIGMOID  COLECTOMY WITH COLOSTOMY (N/A) BIOPSY Mesentary  Patient location during evaluation: PACU Anesthesia Type: General Level of consciousness: awake and alert Pain management: pain level controlled Vital Signs Assessment: post-procedure vital signs reviewed and stable Respiratory status: spontaneous breathing, nonlabored ventilation, respiratory function stable and patient connected to nasal cannula oxygen Cardiovascular status: blood pressure returned to baseline and stable Postop Assessment: no signs of nausea or vomiting Anesthetic complications: no     Last Vitals:  Vitals:   04/11/16 0612 04/11/16 0814  BP: (!) 157/93   Pulse: 87   Resp:  (!) 29  Temp: 36.8 C     Last Pain:  Vitals:   04/11/16 0612  TempSrc: Oral  PainSc:    Pain Goal: Patients Stated Pain Goal: 3 (04/10/16 1800)               Tiajuana Amass

## 2016-04-11 NOTE — Progress Notes (Signed)
Subjective: Patient's pain is well-controlled. He is having flatus and has had stool in his colostomy bag. This morning the patient seemed more depressed than prior. I suspect this is secondary to learning that the mass found in his sigmoid colon is cancerous. He has not had vomiting. All his questions were answered.  Objective:  Vital signs in last 24 hours: Vitals:   04/11/16 0612 04/11/16 0814 04/11/16 1009 04/11/16 1222  BP: (!) 157/93  (!) 146/91   Pulse: 87  85   Resp:  (!) 29 20 (!) 22  Temp: 98.2 F (36.8 C)  98 F (36.7 C)   TempSrc: Oral  Oral   SpO2: 96% 96% 94% 95%  Weight:      Height:       Physical Exam  Constitutional: He is oriented to person, place, and time. He appears well-developed and well-nourished.  HENT:  Head: Normocephalic and atraumatic.  NG tube in place and attached to suction  Cardiovascular: Normal rate and regular rhythm.  Exam reveals no friction rub.   No murmur heard. Respiratory: Effort normal and breath sounds normal. No respiratory distress. He has no wheezes.  GI: He exhibits distension. There is tenderness.  Colostomy bag present. Presurgical site without erythema or signs of infection. Patient has minimal tenderness to palpation around surgical site. His bowel sounds are present. His abdomen remains distended.  Musculoskeletal: He exhibits no edema.  Neurological: He is oriented to person, place, and time.     Assessment/Plan:  Active Problems:   Ileus (HCC)   Essential hypertension   Acute kidney injury superimposed on chronic kidney disease Stage 2 (HCC)   Enterocolitis   Aorto-iliac atherosclerosis (HCC)   Elevated fasting glucose   Right inguinal hernia   Adenocarcinoma of colon ([place])  1. Small bowel obstruction secondary to adenocarcinoma of the proximal sigmoid colon The patient is postop day #1. He is status post partial colectomy with colostomy. He is afebrile and hemodynamically stable. His pain is well controlled on  PCA pump. He has an NG tube which is attached to suction. His pathology from the flex sigmoidoscopy and biopsy demonstrated adenocarcinoma. Oncology has been consulted and we will follow up with their recommendations. For now, we will continue to work on pain control and electrolyte balance. -- NPO -- Follow-up oncology recommendations -- NG attached to suction -- D5 half-normal saline with 39mEq of potassium added at 18mL per hour -- Biopsy pathology = adenocarcinoma -- Follow-up gastroenterology recommendation -- Follow-up general surgery recommendation -- Zofran PRN -- PCA with hydromorphone with a lockout dose of 1.25 mg every hour   2. Acute on chronic kidney injury The patient's most recent creatinine was 2.74. Prior was 2.58. I suspect this popping creatinine was secondary to surgery yesterday and NG tube on suction over the previous 24 hours. The patient has baseline chronic kidney disease stage II and at time of presentation had an acute kidney injury complicating his underlying renal disease. -- Maintenance IV fluids as above -- BMP daily  3. Electrolyte imbalance, resolved --Continue D5 half-normal saline with 20 mEq of potassium added 100 mL per hour  4. Hypertension The patient's most recent blood pressure was 146/91. At this time following surgery and in the setting of acute kidney injury we will continue to hold his antihypertensive medications. If he remains hypertensive tomorrow we will restart his medications appropriately. -- Hold home amlodipine and labetalol  5. Prediabetes The patient is hemoglobin A1c of 6.3 which makes the patient  prediabetic. -- Discuss lifestyle and diet modifications in the outpatient setting  DVT/PE prophylaxis: Lovenox FEN/GI:Nothing by mouth for surgery  Dispo: Anticipated discharge pending medical course.  Ophelia Shoulder, MD 04/11/2016, 12:23 PM Pager: [phone]

## 2016-04-11 NOTE — Progress Notes (Signed)
GS Progress Note Subjective: Patient looks extremely well.  Had a good night.  Pain is controlled with the PCA  Objective: Vital signs in last 24 hours: Temp:  [97.3 F (36.3 C)-98.9 F (37.2 C)] 98.2 F (36.8 C) (12/08 0612) Pulse Rate:  [82-100] 87 (12/08 0612) Resp:  [10-33] 21 (12/08 0400) BP: (142-172)/(85-108) 157/93 (12/08 0612) SpO2:  [92 %-96 %] 96 % (12/08 0612) Last BM Date: 04/09/16  Intake/Output from previous day: 12/07 0701 - 12/08 0700 In: 3867.5 [I.V.:3737.5; NG/GT:30; IV Piggyback:100] Out: 2025 [Urine:925; Emesis/NG output:1050; Blood:50] Intake/Output this shift: No intake/output data recorded.  Lungs: Clear to auscultation.  Sats are good.  Abd: Softer, still distended.  450 NGT output last shift.  Has liquid stool output in the ostomy bag.  Passing gas through stoma also.  Extremities: No changes.  No clinical signs or symptoms of DVT  Neuro: Intact  Lab Results: CBC   Recent Labs  04/10/16 0459 04/11/16 0343  WBC 8.1 13.0*  HGB 11.0* 11.2*  HCT 33.1* 34.4*  PLT 162 180   BMET  Recent Labs  04/10/16 0459 04/11/16 0343  NA 142 141  K 4.6 4.5  CL 107 105  CO2 25 25  GLUCOSE 152* 168*  BUN 27* 25*  CREATININE 2.58* 2.74*  CALCIUM 8.6* 8.1*   PT/INR No results for input(s): LABPROT, INR in the last 72 hours. ABG No results for input(s): PHART, HCO3 in the last 72 hours.  Invalid input(s): PCO2, PO2  Studies/Results: No results found.  Anti-infectives: Anti-infectives    Start     Dose/Rate Route Frequency Ordered Stop   04/10/16 1316  cefoTEtan in Dextrose 5% (CEFOTAN) 2-2.08 GM-% IVPB  Status:  Discontinued    Comments:  Nyoka Cowden   : cabinet override      04/10/16 1316 04/10/16 1319   04/10/16 0600  cefoTEtan (CEFOTAN) 1 g in dextrose 5 % 50 mL IVPB     1 g 100 mL/hr over 30 Minutes Intravenous To Surgery 04/09/16 1353 04/10/16 1520      Assessment/Plan: s/p Procedure(s): SIGMOID  COLECTOMY WITH  COLOSTOMY BIOPSY Mesentary d/c foley Mobilize  May have flavored ice pops.  LOS: 8 days    Kathryne Eriksson. Dahlia Bailiff, MD, FACS [phone] [facility] Surgery 04/11/2016

## 2016-04-11 NOTE — Progress Notes (Signed)
MD made aware about elevated BP.

## 2016-04-12 DIAGNOSIS — C189 Malignant neoplasm of colon, unspecified: Secondary | ICD-10-CM

## 2016-04-12 DIAGNOSIS — N189 Chronic kidney disease, unspecified: Secondary | ICD-10-CM

## 2016-04-12 DIAGNOSIS — N179 Acute kidney failure, unspecified: Secondary | ICD-10-CM

## 2016-04-12 LAB — BASIC METABOLIC PANEL
Anion gap: 9 (ref 5–15)
BUN: 25 mg/dL — AB (ref 6–20)
CALCIUM: 7.8 mg/dL — AB (ref 8.9–10.3)
CO2: 26 mmol/L (ref 22–32)
CREATININE: 2.8 mg/dL — AB (ref 0.61–1.24)
Chloride: 101 mmol/L (ref 101–111)
GFR calc non Af Amer: 22 mL/min — ABNORMAL LOW (ref >60)
GFR, EST AFRICAN AMERICAN: 25 mL/min — AB (ref >60)
Glucose, Bld: 169 mg/dL — ABNORMAL HIGH (ref 65–99)
Potassium: 3.8 mmol/L (ref 3.5–5.1)
Sodium: 136 mmol/L (ref 135–145)

## 2016-04-12 LAB — CBC WITH DIFFERENTIAL/PLATELET
BASOS PCT: 0 %
Basophils Absolute: 0 10*3/uL (ref 0.0–0.1)
EOS ABS: 0.1 10*3/uL (ref 0.0–0.7)
Eosinophils Relative: 1 %
HEMATOCRIT: 30.9 % — AB (ref 39.0–52.0)
Hemoglobin: 10.2 g/dL — ABNORMAL LOW (ref 13.0–17.0)
LYMPHS ABS: 1.1 10*3/uL (ref 0.7–4.0)
Lymphocytes Relative: 11 %
MCH: 28 pg (ref 26.0–34.0)
MCHC: 33 g/dL (ref 30.0–36.0)
MCV: 84.9 fL (ref 78.0–100.0)
MONO ABS: 0.7 10*3/uL (ref 0.1–1.0)
MONOS PCT: 7 %
NEUTROS ABS: 7.9 10*3/uL — AB (ref 1.7–7.7)
Neutrophils Relative %: 81 %
Platelets: 159 10*3/uL (ref 150–400)
RBC: 3.64 MIL/uL — ABNORMAL LOW (ref 4.22–5.81)
RDW: 15.9 % — AB (ref 11.5–15.5)
WBC: 9.8 10*3/uL (ref 4.0–10.5)

## 2016-04-12 MED ORDER — DEXTROSE-NACL 5-0.45 % IV SOLN
INTRAVENOUS | Status: DC
  Administered 2016-04-12: via INTRAVENOUS

## 2016-04-12 MED ORDER — HYDROMORPHONE HCL 2 MG/ML IJ SOLN: 1.0000 mg | INTRAMUSCULAR | Status: DC | PRN

## 2016-04-12 NOTE — Progress Notes (Signed)
Doctor that saw patient this morning told patient he could start a clear liquid diet, slowly. I was witness to this order and therefore placed the order since it was not put in. Patient is currently asymptomatic, denies n/v,  has bowel in his colostomy and NG tube is currently clamped per family request.   Vergia Alberts n 04/12/16 1:52 PM

## 2016-04-12 NOTE — Consult Note (Signed)
[facility]  [phone]   HEMATOLOGY ONCOLOGY INPATIENT CONSULTATION   Louis [name]  DOB: Dec 23, 1945  MR#: [id number]  CSN#: [id number]    Requesting Physician: [facility]  Patient Care Team: Donato Heinz, MD as PCP - General (Nephrology)  Reason for consult: colon cancer   History of present illness:  70 year old Caucasian male with past medical history of hypertension and chronic kidney disease, was admitted for bowel obstruction. I was consulted for his newly diagnosed colon cancer.  He developed nausea, vomiting, diarrhea about months ago, initially was seen by his primary care physician and was treated for food poisoning. His symptoms did improve, but it recurred quickly. He denies melena or hematochezia. He has had intermittent abdominal pain. He was admitted to hospital on 05/02/2016 for worsening abdominal pain, nausea, and vomiting. CT scan on admission showed bowel obstruction, right inguinal hernia, and prominent prostate. He was initially treated with NPO and infectious enterocolitis. Due to the persistent symptoms, he was seen by GI, and underwent flexible sigmoidoscopy on 04/09/2016, which showed only obstructive descending colon mass. Biopsy showed adenocarcinoma. He was seen by surgeon Dr. Dema Severin, and underwent left hemicolectomy colectomy and colostomy. During the surgery, a 2 mm omentum nodule was seen and was biopsied, the surgical pathology is not back yet.  Today is postop day 2, he is still on PCA, had several bowel movements from stoma, he still feels quite bloated. He was sitting in a chair when I saw him.   MEDICAL HISTORY:  Past Medical History:  Diagnosis Date  . CKD (chronic kidney disease) stage 2, GFR 60-89 ml/min   . Hernia, abdominal   . Hypertension     SURGICAL HISTORY: Past Surgical History:  Procedure Laterality Date  . BIOPSY  04/10/2016   Procedure: BIOPSY Mesentary;  Surgeon: Judeth Horn, MD;  Location:  [facility];  Service: General;;  . FLEXIBLE SIGMOIDOSCOPY N/A 04/09/2016   Procedure: FLEXIBLE SIGMOIDOSCOPY;  Surgeon: Ladene Artist, MD;  Location: [facility] ENDOSCOPY;  Service: Endoscopy;  Laterality: N/A;  . HERNIA REPAIR     in childhood  . PARTIAL COLECTOMY N/A 04/10/2016   Procedure: SIGMOID  COLECTOMY WITH COLOSTOMY;  Surgeon: Judeth Horn, MD;  Location: [place];  Service: General;  Laterality: N/A;  . TONSILLECTOMY      SOCIAL HISTORY: Social History   Social History  . Marital status: Married    Spouse name: N/A  . Number of children: N/A  . Years of education: N/A   Occupational History  . Not on file.   Social History Main Topics  . Smoking status: Never Smoker  . Smokeless tobacco: Never Used  . Alcohol use Yes     Comment: occ  . Drug use: No  . Sexual activity: Not on file   Other Topics Concern  . Not on file   Social History Narrative  . No narrative on file    FAMILY HISTORY: History reviewed. No pertinent family history.  ALLERGIES:  has No Known Allergies.  MEDICATIONS:  Current Facility-Administered Medications  Medication Dose Route Frequency Provider Last Rate Last Dose  . amLODipine (NORVASC) tablet 10 mg  10 mg Oral Daily Judeth Horn, MD   10 mg at 04/12/16 0948  . dextrose 5 %-0.45 % sodium chloride infusion   Intravenous Continuous Ophelia Shoulder, MD 125 mL/hr at 04/12/16 1215    . diphenhydrAMINE (BENADRYL) injection 12.5 mg  12.5 mg Intravenous Q6H PRN Georganna Skeans, MD  Or  . diphenhydrAMINE (BENADRYL) 12.5 MG/5ML elixir 12.5 mg  12.5 mg Oral Q6H PRN Georganna Skeans, MD      . enoxaparin (LOVENOX) injection 40 mg  40 mg Subcutaneous Q24H Judeth Horn, MD   40 mg at 04/12/16 0818  . hydrALAZINE (APRESOLINE) injection 5 mg  5 mg Intravenous Q6H PRN Ophelia Shoulder, MD      . HYDROmorphone (DILAUDID) 1 mg/mL PCA injection   Intravenous Q4H Georganna Skeans, MD      . labetalol (NORMODYNE) tablet 300 mg  300 mg Oral BID Judeth Horn, MD   300 mg at  04/12/16 0948  . methocarbamol (ROBAXIN) 500 mg in dextrose 5 % 50 mL IVPB  500 mg Intravenous Q8H Priscella Mann, RPH   500 mg at 04/12/16 1215  . naloxone [facility]) injection 0.4 mg  0.4 mg Intravenous PRN Georganna Skeans, MD       And  . sodium chloride flush (NS) 0.9 % injection 9 mL  9 mL Intravenous PRN Georganna Skeans, MD      . ondansetron [facility]) tablet 4 mg  4 mg Oral Q6H PRN Milagros Loll, MD       Or  . ondansetron [facility]) injection 4 mg  4 mg Intravenous Q6H PRN Milagros Loll, MD      . phenol [facility]) mouth spray 1 spray  1 spray Mouth/Throat PRN Vena Rua, PA-C   1 spray at 04/08/16 1545  . ramelteon (ROZEREM) tablet 8 mg  8 mg Oral QHS Ledell Noss, MD   8 mg at 04/11/16 2033    REVIEW OF SYSTEMS:   Constitutional: Denies fevers, chills or abnormal night sweats Eyes: Denies blurriness of vision, double vision or watery eyes Ears, nose, mouth, throat, and face: Denies mucositis or sore throat Respiratory: Denies cough, dyspnea or wheezes Cardiovascular: Denies palpitation, chest discomfort or lower extremity swelling Gastrointestinal:  Denies nausea, heartburn or change in bowel habits Skin: Denies abnormal skin rashes Lymphatics: Denies new lymphadenopathy or easy bruising Neurological:Denies numbness, tingling or new weaknesses Behavioral/Psych: Mood is stable, no new changes  All other systems were reviewed with the patient and are negative.  PHYSICAL EXAMINATION: ECOG PERFORMANCE STATUS: 3 - Symptomatic, >50% confined to bed  Vitals:   04/12/16 1220 04/12/16 1234  BP:  (!) 84/52  Pulse:  72  Resp: 16 16  Temp:  98.6 F (37 C)   Filed Weights   04/02/16 0724 04/02/16 1720  Weight: 234 lb (106.1 kg) 234 lb (106.1 kg)    GENERAL:alert, no distress and comfortable, he has NG tube in place  SKIN: skin color, texture, turgor are normal, no rashes or significant lesions EYES: normal, conjunctiva are pink and non-injected, sclera  clear OROPHARYNX:no exudate, no erythema and lips, buccal mucosa, and tongue normal  NECK: supple, thyroid normal size, non-tender, without nodularity LYMPH:  no palpable lymphadenopathy in the cervical, axillary or inguinal LUNGS: clear to auscultation and percussion with normal breathing effort HEART: regular rate & rhythm and no murmurs and no lower extremity edema ABDOMEN:abdomen soft, bloated, (+) stoma in place,  non-tender and normal bowel sounds Musculoskeletal:no cyanosis of digits and no clubbing  PSYCH: alert & oriented x 3 with fluent speech NEURO: no focal motor/sensory deficits  LABORATORY DATA:  I have reviewed the data as listed Lab Results  Component Value Date   WBC 9.8 04/12/2016   HGB 10.2 (L) 04/12/2016   HCT 30.9 (L) 04/12/2016   MCV 84.9 04/12/2016   PLT  159 04/12/2016    Recent Labs  04/02/16 0719  04/05/16 1521  04/06/16 1542  04/10/16 0459 04/11/16 0343 04/12/16 0239  NA 136  < > 136  < > 136  < > 142 141 136  K 4.1  < > 3.9  < > 3.8  < > 4.6 4.5 3.8  CL 103  < > 107  < > 106  < > 107 105 101  CO2 19*  < > 23  < > 22  < > 25 25 26   GLUCOSE 157*  < > 126*  < > 133*  < > 152* 168* 169*  BUN 62*  < > 20  < > 19  < > 27* 25* 25*  CREATININE 3.50*  < > 2.17*  < > 2.18*  < > 2.58* 2.74* 2.80*  CALCIUM 9.0  < > 8.9  < > 9.0  < > 8.6* 8.1* 7.8*  GFRNONAA 16*  < > 29*  < > 29*  < > 24* 22* 22*  GFRAA 19*  < > 34*  < > 34*  < > 28* 26* 25*  PROT 7.2  --   --   --   --   --   --   --   --   ALBUMIN 3.4*  --  2.8*  --  2.7*  --   --   --   --   AST 13*  --   --   --   --   --   --   --   --   ALT 13*  --   --   --   --   --   --   --   --   ALKPHOS 66  --   --   --   --   --   --   --   --   BILITOT 0.6  --   --   --   --   --   --   --   --   < > = values in this interval not displayed.  PATHOLOGY REPORT  Diagnosis 04/09/2016 Colon, biopsy, Descending - ADENOCARCINOMA. - SEE COMMENT.   RADIOGRAPHIC STUDIES: I have personally reviewed the  radiological images as listed and agreed with the findings in the report. Ct Abdomen Pelvis Wo Contrast  Result Date: 04/02/2016 CLINICAL DATA:  Two week history of abdominal distention with nausea, vomiting common diarrhea EXAM: CT ABDOMEN AND PELVIS WITHOUT CONTRAST TECHNIQUE: Multidetector CT imaging of the abdomen and pelvis was performed following the standard protocol without IV contrast. Oral contrast was administered. COMPARISON:  None. FINDINGS: Lower chest: There is patchy bibasilar atelectasis. There are scattered foci of coronary artery calcification. Hepatobiliary: No focal liver lesions are evident on this non intravenous contrast enhanced study. The gallbladder appears distended without appreciable gallbladder wall thickening. There is no biliary duct dilatation. Pancreas: There is no pancreatic mass or inflammatory focus. Spleen: No splenic lesions are evident. Adrenals/Urinary Tract: Adrenals appear normal bilaterally. There is a cyst arising from the lower pole of the left kidney measuring 7.0 x 6.1 cm. There is a cyst arising from the posterior mid right kidney measuring 2.3 x 2.2 cm. There is an immediately adjacent 1 x 1 cm cyst in the right kidney in this region. There is no renal hydronephrosis on either side. There is no renal or ureteral calculus on either side. Urinary bladder is midline with a portion of the urinary bladder extending into  a right inguinal hernia. Portions of the urinary bladder have a thickened wall. Stomach/Bowel: The stomach is nondistended. The proximal small bowel is nondistended. There is dilatation of small bowel from the level of the proximal to mid jejunum throughout its course. There is distention of most of the colon. There is a significant amount of fluid throughout the colon as well as much of the distal small bowel. A well-defined transition zone suggesting a focal site of small or large bowel obstruction is not appreciated. There is no bowel wall or  mesenteric thickening. There is no free air or portal venous air. Vascular/Lymphatic: There is atherosclerotic calcification in the aorta and common iliac arteries. There is no demonstrable abdominal aortic aneurysm. Major mesenteric arterial vessels appear patent on this noncontrast enhanced study. There is no appreciable adenopathy in the abdomen or pelvis. Reproductive: The prostate appears mildly enlarged and is difficult to separate from the inferior urinary bladder. There are several prostatic calculi. The seminal vesicles appear unremarkable. No pelvic mass. Other: Appendix appears unremarkable without inflammation. No abscess or ascites is evident in the abdomen or pelvis. There is a right inguinal hernia which contains a portion of a prolapsed urinary bladder with wall thickening in the bladder. There is fat in the left inguinal ring. There is a minimal ventral hernia containing only fat. Musculoskeletal: There is degenerative change in the lumbar spine. There is moderate spinal stenosis at L3-4 due to bony hypertrophy and diffuse disc protrusion. There are no blastic or lytic bone lesions. There is no intramuscular or abdominal wall lesion. IMPRESSION: Multiple loops of dilated small and large bowel with fluid throughout much of the bowel. A well-defined transition zone is not seen. There is no appreciable bowel wall thickening. No bowel pneumatosis evident. The appearance is consistent with ileus, likely with associated enterocolitis. There is a right inguinal hernia which contains a portion of the urinary bladder. There is wall thickening in much of the urinary bladder suggesting a degree of cystitis. Prostate appears prominent and cannot be separated from the inferior bladder. This finding warrants direct physical examination and correlation with PSA. Prostate carcinoma in this circumstance cannot be excluded by CT. There is aortoiliac atherosclerosis. There are foci of coronary artery calcification.  Gallbladder appears somewhat distended without wall thickening. Renal cysts bilaterally. Largest cyst arises from the left kidney measuring 7.0 x 6.1 cm. 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 Electronically Signed   By: Lowella Grip III M.D.   On: 04/02/2016 11:08   Dg Abd 1 View  Result Date: 04/04/2016 CLINICAL DATA:  Abdominal stone shin EXAM: ABDOMEN - 1 VIEW COMPARISON:  04/02/2016 FINDINGS: Diffuse small bowel filled identified and stable. Some colonic gas is noted as well. Contrast material administered previously now lies within the colon consistent with a partial small bowel obstruction. No free air is seen. IMPRESSION: Persistent partial small bowel obstruction. Electronically Signed   By: Inez Catalina M.D.   On: 04/04/2016 11:25   Dg Abd 1 View  Result Date: 03/31/2016 CLINICAL DATA:  Diarrhea, nausea, and vomiting. Distended abdomen with pain for 2 weeks. EXAM: ABDOMEN - 1 VIEW COMPARISON:  None. FINDINGS: Air-filled loop of bowel in the mid abdomen is thought to be colon with a small amount of air in the ascending colon and rectum. Mildly prominent loops of small bowel in the left abdomen with suggested valvular thickening, measuring up to 3.2 cm in caliber. Phleboliths in the pelvis. No free air, portal venous gas, or pneumatosis. IMPRESSION: Mildly  prominent loop of small bowel in the left mid abdomen with possible valvular/wall thickening. A CT scan could better evaluate as clinically warranted. These results will be called to the ordering clinician or representative by the Radiologist Assistant, and communication documented in the PACS or zVision Dashboard. Electronically Signed   By: Dorise Bullion III M.D   On: 03/31/2016 12:12   Dg Abd 2 Views  Result Date: 04/07/2016 CLINICAL DATA:  Ileus. EXAM: ABDOMEN - 2 VIEW COMPARISON:  04/04/2016 FINDINGS: There is persistent prominent gaseous dilatation diffusely involving small bowel an at least a portion of the colon. The  degree of bowel dilatation is stable to minimally increased compared to the prior study. Distal colonic/ rectal contrast on the prior study is no longer seen. No intraperitoneal free air is identified. No acute osseous abnormality is seen. IMPRESSION: 1. Persistent diffuse dilatation of small and large bowel loops suggestive of ileus. 2. Interval passage of residual GI contrast material. Electronically Signed   By: Logan Bores M.D.   On: 04/07/2016 17:57   Dg Colon W/water Sol Cm  Result Date: 04/08/2016 CLINICAL DATA:  70 year old male with unresolved abdominal distension due to widespread small and large bowel gaseous distention. Still, there was slow transit of oral contrast administered for the CT on 04/02/2016, which did cleared the intestinal system by twelfth 08/2015. Water-soluble enema requested to exclude obstruction and/or constipation. EXAM: SINGLE COLUMN BARIUM ENEMA TECHNIQUE: Initial scout AP supine abdominal image obtained to insure adequate colon cleansing. Barium was introduced into the colon in a retrograde fashion and refluxed from the rectum to the cecum. Spot images of the colon followed by overhead radiographs were obtained. FLUOROSCOPY TIME:  Fluoroscopy Time:  6 minutes 42 seconds Radiation Exposure Index (if provided by the fluoroscopic device): Number of Acquired Spot Images: 2 COMPARISON:  Abdominal radiographs 04/07/2016 and earlier. CT Abdomen and Pelvis 04/02/2016. FINDINGS: Preprocedural scout view of the abdomen and pelvis demonstrates persistent dilated small and large bowel loops. The rectum was carefully cannulated, and water-soluble contrast was instilled via gravity. However, the balloon on the initial rectal catheter would not stay inflated. After contrast filling of the rectum and a redundant sigmoid, contrast only leaked from the anal verge. After several attempts at re- inflating the balloon the initial catheter was removed and a second rectal catheter placed. With  further contrast filling of the rectum and sigmoid colon an obstruction to the retrograde flow of contrast was encounter did at the junction of the descending and sigmoid colon segments (series 15). After repositioning the patient and re- attempting contrast installation contrast was successfully refluxed proximal to this obstruction. Ultimately the entire left colon, transverse colon, and right colon were able to be opacified. However, a short segment of severe irregularity and high-grade appearing stenosis persisted at the junction of the left and sigmoid colons. See series 27 and 28. On re-inspection of the left lower quadrant large bowel on the recent CT Abdomen and Pelvis a corresponding 3-4 cm segment of abnormal large bowel is demonstrated (coronal image 74 of the CT comparison). Where there appears to be bowel wall thickening, narrowing of the lumen, and mild adjacent mesenteric stranding. IMPRESSION: 1. Obstructing lesion at the junction of the descending and sigmoid colon segments. Recommend Colonoscopy. While this might be a benign stricture, the Radiographic and CT appearance of this lesion is suspicious for an obstructing Adenocarcinoma. 2. With effort water-soluble contrast was able to be refluxed past the obstruction into the more proximal colon, and the  other large bowel segments appear normal. 3. Study discussed by telephone with Dr. Filbert Berthold on 04/08/2016 At 1430 hours. Electronically Signed   By: Genevie Ann M.D.   On: 04/08/2016 14:58   Flexible sigmoidoscopy 04/09/2016 - Malignant appearing, completely obstructing tumor in the descending colon. Biopsied. - Internal hemorrhoids. - The examination was otherwise normal.   ASSESSMENT & PLAN: 71 year old Caucasian male, with past medical history of hypertension and chronic kidney disease, presented with abdominal pain, nausea and vomiting, CT scan showed a bowel obstruction.  1. Descending colon adenocarcinoma, TxNxMx 2. Essential  hypertension 3. Acute on chronic kidney disease 4. Right inguinal hernia  Recommendations: -staging work up: his CT abdomen and pelvis was done without contrast due to his chronic kidney disease, I prefer him to get a PET scan as outpatient to complete his staging, ruled out distant metastasis. -I will take the liberty to order a CEA, unfortunately it was not done before surgery  -I discussed the nature history of colon cancer, and the risk of recurrence after complete surgical resection, which is dependent on his stage. -he was found to have a mesenteric implant, peritoneal metastasis is not ruled out, pathology still pending -I discussed role of adjuvant chemotherapy for locally advanced (stage III or hight risk stage II) and metastatic colon cancer. Due to his CKD, he is not a candidate for Xeloda, he will likely need FOLFOX, and a port placement  -We reviewed the benefit and side effects from chemo and logistics of chemo treatment -I recommend him to focus on recovery from surgery, and I will see him back in 3 weeks after surgery, to prepare his chemo if he needed, chemo will start in 4-8 weeks after surgery  -Pt is interested in chemo if he needed.  -I will see him back before discharge if his surgical path is back, or will set up his follow up in my clinic.   Thanks for the opportunity to participate in his care.  All questions were answered. The patient knows to call the clinic with any problems, questions or concerns.      Truitt Merle, MD 04/12/2016 2:00 PM

## 2016-04-12 NOTE — Progress Notes (Signed)
   Subjective: Patient's states his pain is well controlled ar rest; reports having intermittent abdominal cramps when moving. Stool present in his colostomy bag. Denies any nausea or vomiting.   Objective:  Vital signs in last 24 hours: Vitals:   04/12/16 0304 04/12/16 0447 04/12/16 0800 04/12/16 0944  BP:  114/70  116/70  Pulse:  87  75  Resp: 20 20 (!) 22 16  Temp:  99.3 F (37.4 C)  98 F (36.7 C)  TempSrc:  Oral  Oral  SpO2: 93% 90% 98% 94%  Weight:      Height:       Physical Exam  Constitutional: He is oriented to person, place, and time. He appears well-developed and well-nourished.  HENT:  Head: Normocephalic and atraumatic.  NG tube in place and attached to suction  Cardiovascular: Normal rate and regular rhythm.  Exam reveals no friction rub.   No murmur heard. Respiratory: Effort normal and breath sounds normal. No respiratory distress. He has no wheezes.  GI: He exhibits distension. There is no tenderness. There is no guarding.  Colostomy bag present. Surgical site without erythema or signs of infection. His bowel sounds are present. His abdomen remains distended.  Musculoskeletal: He exhibits no edema.  Neurological: He is oriented to person, place, and time.     Assessment/Plan:  Active Problems:   Ileus (HCC)   Essential hypertension   Acute kidney injury superimposed on chronic kidney disease Stage 2 (HCC)   Enterocolitis   Aorto-iliac atherosclerosis (HCC)   Elevated fasting glucose   Right inguinal hernia   Adenocarcinoma of colon ([place])  1. Small bowel obstruction secondary to adenocarcinoma of the proximal sigmoid colon The patient is postop day #2. He is status post partial colectomy with colostomy. He is afebrile and no leukocytosis on labs. His pain is well controlled on PCA pump. NGT output 500cc. Oncology has been consulted and we will follow up with their recommendations. For now, we will continue to work on pain control and electrolyte  balance. -- NPO -- Follow-up oncology recommendations -- NG attached to suction will remain in place for another 1 day per patient preference  -- D5 half-normal saline 138mL per hour -- Follow-up gastroenterology recommendation -- Follow-up general surgery recommendation -- Zofran PRN -- PCA with hydromorphone for pain mgmt   2. Acute on chronic kidney injury The patient's most recent creatinine was 2.8. Prior was 78. Elevated creatinine likely pre-renal in the setting of recent surgery and NG tube on suction. The patient has baseline chronic kidney disease stage II and at time of presentation had an acute kidney injury complicating his underlying renal disease. -- Maintenance IV fluids as above -- BMP daily  3. Electrolyte imbalance, resolved --Continue D5 half-normal at 100 mL per hour  4. Hypertension BP stable; we will continue to hold his antihypertensive medications.  -- Hold home amlodipine and labetalol  5. Prediabetes The patient is hemoglobin A1c of 6.3 which makes the patient prediabetic. -- Discuss lifestyle and diet modifications in the outpatient setting  DVT/PE prophylaxis: Lovenox FEN/GI:Nothing by mouth for surgery  Dispo: Anticipated discharge pending medical course.  Shela Leff, MD 04/12/2016, 11:07 AM Pager: [phone]

## 2016-04-12 NOTE — Progress Notes (Signed)
Patient requested to stop the Dilaudid PCA pump. States he would rather have PRN pain medication. MD was paged waiting for a response.  Vergia Alberts n 04/12/16 3:09 PM

## 2016-04-12 NOTE — Progress Notes (Signed)
2 Days Post-Op  Subjective: Had several BM's from stoma Still feels bloated  Objective: Vital signs in last 24 hours: Temp:  [98 F (36.7 C)-99.3 F (37.4 C)] 99.3 F (37.4 C) (12/09 0447) Pulse Rate:  [85-112] 87 (12/09 0447) Resp:  [11-22] 22 (12/09 0800) BP: (114-163)/(66-91) 114/70 (12/09 0447) SpO2:  [90 %-98 %] 98 % (12/09 0800) Last BM Date: 04/11/16 (colostomy)  Intake/Output from previous day: 12/08 0701 - 12/09 0700 In: 2756.3 [I.V.:2656.3; IV Piggyback:100] Out: 906 [Urine:1; Emesis/NG output:500; Stool:405] Intake/Output this shift: No intake/output data recorded.  Exam: Abdomen still protuberant. occ BS Stoma pink, productive  Lab Results:   Recent Labs  04/11/16 0343 04/12/16 0239  WBC 13.0* 9.8  HGB 11.2* 10.2*  HCT 34.4* 30.9*  PLT 180 159   BMET  Recent Labs  04/11/16 0343 04/12/16 0239  NA 141 136  K 4.5 3.8  CL 105 101  CO2 25 26  GLUCOSE 168* 169*  BUN 25* 25*  CREATININE 2.74* 2.80*  CALCIUM 8.1* 7.8*   PT/INR No results for input(s): LABPROT, INR in the last 72 hours. ABG No results for input(s): PHART, HCO3 in the last 72 hours.  Invalid input(s): PCO2, PO2  Studies/Results: No results found.  Anti-infectives: Anti-infectives    Start     Dose/Rate Route Frequency Ordered Stop   04/10/16 1316  cefoTEtan in Dextrose 5% (CEFOTAN) 2-2.08 GM-% IVPB  Status:  Discontinued    Comments:  Nyoka Cowden   : cabinet override      04/10/16 1316 04/10/16 1319   04/10/16 0600  cefoTEtan (CEFOTAN) 1 g in dextrose 5 % 50 mL IVPB     1 g 100 mL/hr over 30 Minutes Intravenous To Surgery 04/09/16 1353 04/10/16 1520      Assessment/Plan: s/p Procedure(s): SIGMOID  COLECTOMY WITH COLOSTOMY (N/A) BIOPSY Mesentary  Starting to have bowel function.  NG with 500 out yesterday.  Patient wants to leave it in one more day  Continue current care  LOS: 9 days    Sabastien Tyler A 04/12/2016

## 2016-04-13 LAB — GLUCOSE, CAPILLARY: GLUCOSE-CAPILLARY: 123 mg/dL — AB (ref 65–99)

## 2016-04-13 MED ORDER — LABETALOL HCL 100 MG PO TABS
100.0000 mg | ORAL_TABLET | Freq: Once | ORAL | Status: DC
  Filled 2016-04-13: qty 1

## 2016-04-13 MED ORDER — SODIUM CHLORIDE 0.9 % IV SOLN
INTRAVENOUS | Status: DC
  Administered 2016-04-13 – 2016-04-14 (×2): via INTRAVENOUS

## 2016-04-13 NOTE — Progress Notes (Signed)
   Subjective: Patient denies having any pain. Stool present in his colostomy bag. Denies any nausea or vomiting.   Objective:  Vital signs in last 24 hours: Vitals:   04/12/16 1821 04/12/16 2100 04/13/16 0528 04/13/16 1032  BP: (!) 103/55 115/64 100/66 118/63  Pulse: 73 72 88 83  Resp: 16 16 19    Temp: 97.8 F (36.6 C) 97.7 F (36.5 C) 98 F (36.7 C)   TempSrc: Oral Oral Oral   SpO2: 99% 98% 97%   Weight:      Height:       Physical Exam  Constitutional: He is oriented to person, place, and time. He appears well-developed and well-nourished.  HENT:  Head: Normocephalic and atraumatic.  NG tube in place; not attached to suction  Cardiovascular: Normal rate and regular rhythm.  Exam reveals no friction rub.   No murmur heard. Respiratory: Effort normal and breath sounds normal. No respiratory distress. He has no wheezes.  GI: He exhibits distension. There is no tenderness. There is no guarding.  Colostomy bag present. Surgical site without erythema or signs of infection. His bowel sounds are present. His abdomen remains distended.  Musculoskeletal: He exhibits no edema.  Neurological: He is oriented to person, place, and time.     Assessment/Plan:  Active Problems:   Ileus (HCC)   Essential hypertension   Acute kidney injury superimposed on chronic kidney disease Stage 2 (HCC)   Enterocolitis   Aorto-iliac atherosclerosis (HCC)   Elevated fasting glucose   Right inguinal hernia   Adenocarcinoma of colon ([place])  1. Small bowel obstruction secondary to adenocarcinoma of the proximal sigmoid colon The patient is postop day #3. He is status post partial colectomy with colostomy. He has remained afebrile. His pain is well controlled. NGT clamped. Oncology has been consulted and we will follow up with their recommendations. For now, we will continue to work on pain control and electrolyte balance.Oncology recommending PET scan for staging as outpatient. Recommending chemo to  be started 4-8 weeks after surgery, for now focus on recovery from surgery.  -- NPO. Advance diet tomorrow per surgery recs.  -- NG clamped  -- NS@75  cc/hr -- Zofran PRN -- Hydromorphone 1 mg q1 prn  -- CEA pending  -- Will need to f/u with oncology as outpatient   2. Acute on chronic kidney injury The patient's most recent creatinine was 2.8. Prior was 2.7. Elevated creatinine likely pre-renal in the setting of recent surgery and NG tube on suction. The patient has baseline chronic kidney disease stage II and at time of presentation had an acute kidney injury complicating his underlying renal disease. -- Maintenance IV fluids as above -- BMP daily  3. Electrolyte imbalance, resolved --Continue to monitor  4. Hypertension BP stable.  -- Hold home amlodipine -- Restart home labetalol  5. Prediabetes The patient is hemoglobin A1c of 6.3 which makes the patient prediabetic. -- Discuss lifestyle and diet modifications in the outpatient setting  DVT/PE prophylaxis: Lovenox FEN/GI:Nothing by mouth for surgery  Dispo: Anticipated discharge pending medical course.  Shela Leff, MD 04/13/2016, 2:25 PM Pager: [phone]

## 2016-04-13 NOTE — Progress Notes (Signed)
3 Days Post-Op  Subjective: He looks much better today.  He had some drainage from the NG (950), but no nausea or emesis with NG clamped.  Ostomy is working Abdominal dressing OK,   Objective: Vital signs in last 24 hours: Temp:  [97.7 F (36.5 C)-98.6 F (37 C)] 98 F (36.7 C) (12/10 0528) Pulse Rate:  [72-88] 88 (12/10 0528) Resp:  [16-19] 19 (12/10 0528) BP: (84-116)/(52-70) 100/66 (12/10 0528) SpO2:  [93 %-99 %] 97 % (12/10 0528) Last BM Date: 04/12/16 1218 IV Urine 550 recorded 950 NG 200 from the ostomy Afebrile, VSS, but BP down some from before one  BP around 11 AM down to 84 Creatinine is trending up H/H down some No films Intake/Output from previous day: 12/09 0701 - 12/10 0700 In: 1353.8 [I.V.:1218.8; NG/GT:30; IV Piggyback:105] Out: 1700 [Urine:550; Emesis/NG output:950; Stool:200] Intake/Output this shift: Total I/O In: -  Out: 350 [Stool:350]  General appearance: alert, cooperative and no distress Resp: rales in both bases GI: soft, sore, wound OK + BS and stool in ostomy bag.  Lab Results:   Recent Labs  04/11/16 0343 04/12/16 0239  WBC 13.0* 9.8  HGB 11.2* 10.2*  HCT 34.4* 30.9*  PLT 180 159    BMET  Recent Labs  04/11/16 0343 04/12/16 0239  NA 141 136  K 4.5 3.8  CL 105 101  CO2 25 26  GLUCOSE 168* 169*  BUN 25* 25*  CREATININE 2.74* 2.80*  CALCIUM 8.1* 7.8*   PT/INR No results for input(s): LABPROT, INR in the last 72 hours.   Recent Labs Lab 04/06/16 1542  ALBUMIN 2.7*     Lipase     Component Value Date/Time   LIPASE 19 04/02/2016 0719     Studies/Results: No results found. Prior to Admission medications   Medication Sig Start Date End Date Taking? Authorizing Provider  labetalol (NORMODYNE) 300 MG tablet Take 300 mg by mouth 2 (two) times daily. 03/02/16  Yes Historical Provider, MD  loperamide (IMODIUM) 2 MG capsule Take 2-4 mg by mouth daily as needed. 03/07/16  Yes Historical Provider, MD  omega-3 acid ethyl  esters (LOVAZA) 1 g capsule Take 1 capsule by mouth daily. 03/22/16  Yes Historical Provider, MD  promethazine (PHENERGAN) 25 MG tablet Take 25 mg by mouth every 6 (six) hours as needed for nausea or vomiting.  03/31/16  Yes Historical Provider, MD  amLODipine (NORVASC) 10 MG tablet Take 10 mg by mouth daily. 02/11/16   Historical Provider, MD    Medications: . amLODipine  10 mg Oral Daily  . enoxaparin (LOVENOX) injection  40 mg Subcutaneous Q24H  . labetalol  300 mg Oral BID  . methocarbamol (ROBAXIN)  IV  500 mg Intravenous Q8H  . ramelteon  8 mg Oral QHS   . dextrose 5 % and 0.45% NaCl 50 mL/hr at 04/12/16 2345   Assessment/Plan Obstructing colon tumor in the distal descending colon with peritoneal single 2.20mm implant SIGMOID  COLECTOMY WITH COLOSTOMY, BIOPSY Mesentary, 04/10/16, Dr. Hulen Skains Chronic constipation Hypertension Chronic kidney disease - creatinine rising 2.58 today Hypomagnesemia - resolved Elevated glucose still an issue  Hemoglobin A1C 6.3 on admit - defer to Medicine FEN: IV fluids/clears ID:  Pre op only DVT: SCD/Lovenox  PLan:  Clamp NG, use IS hourly, embolize more, hope to get NG out in AM and advance diet tomorrow.  change IV fluids to NS and recheck BMP in the AM.    LOS: 10 days    Lennix Kneisel 04/13/2016  [phone]  

## 2016-04-13 NOTE — Progress Notes (Signed)
Patient refused 300 mg labetalol at this time.  Patient is requesting 100 mg.  MD notified.

## 2016-04-13 NOTE — Progress Notes (Signed)
Patient refused the 100 mg labetalol requested. Patient stated he will wait until tonight.

## 2016-04-14 LAB — BASIC METABOLIC PANEL
ANION GAP: 11 (ref 5–15)
BUN: 14 mg/dL (ref 6–20)
CHLORIDE: 101 mmol/L (ref 101–111)
CO2: 26 mmol/L (ref 22–32)
Calcium: 8.4 mg/dL — ABNORMAL LOW (ref 8.9–10.3)
Creatinine, Ser: 2.22 mg/dL — ABNORMAL HIGH (ref 0.61–1.24)
GFR, EST AFRICAN AMERICAN: 33 mL/min — AB (ref >60)
GFR, EST NON AFRICAN AMERICAN: 28 mL/min — AB (ref >60)
Glucose, Bld: 124 mg/dL — ABNORMAL HIGH (ref 65–99)
POTASSIUM: 2.9 mmol/L — AB (ref 3.5–5.1)
SODIUM: 138 mmol/L (ref 135–145)

## 2016-04-14 LAB — MAGNESIUM: MAGNESIUM: 1.7 mg/dL (ref 1.7–2.4)

## 2016-04-14 LAB — CEA: CEA: 5 ng/mL — ABNORMAL HIGH (ref 0.0–4.7)

## 2016-04-14 MED ORDER — OXYCODONE HCL 5 MG PO TABS
5.0000 mg | ORAL_TABLET | ORAL | Status: DC | PRN
  Administered 2016-04-15: 5 mg via ORAL
  Filled 2016-04-14: qty 1

## 2016-04-14 MED ORDER — ACETAMINOPHEN 500 MG PO TABS
1000.0000 mg | ORAL_TABLET | Freq: Four times a day (QID) | ORAL | Status: DC
  Filled 2016-04-14: qty 2

## 2016-04-14 MED ORDER — POTASSIUM CHLORIDE IN NACL 40-0.9 MEQ/L-% IV SOLN
INTRAVENOUS | Status: DC
  Administered 2016-04-14 (×2): 75 mL/h via INTRAVENOUS
  Filled 2016-04-14 (×2): qty 1000

## 2016-04-14 MED ORDER — METHOCARBAMOL 750 MG PO TABS: 750.0000 mg | ORAL_TABLET | Freq: Four times a day (QID) | ORAL | Status: DC | PRN

## 2016-04-14 MED ORDER — SODIUM CHLORIDE 0.9 % IV SOLN
INTRAVENOUS | Status: DC
  Filled 2016-04-14 (×2): qty 1000

## 2016-04-14 NOTE — Progress Notes (Signed)
Initial Nutrition Assessment  DOCUMENTATION CODES:   Not applicable  INTERVENTION:   -RD will follow for diet advancement and supplement as appropriate -If prolonged NPO/clear liquid status is anticipated, recommend initiation of nutrition support  NUTRITION DIAGNOSIS:   Inadequate oral intake related to altered GI function as evidenced by  (NPO/CLD x7 days).  GOAL:   Patient will meet greater than or equal to 90% of their needs  MONITOR:   Diet advancement, Labs, Weight trends, Skin, I & O's  REASON FOR ASSESSMENT:   NPO/Clear Liquid Diet    ASSESSMENT:   70 year old man with CKD came to the emergency department because of increasing abdominal pain and distention. He reports nausea and vomiting 4 days ago, subsequently has had severely reduced appetite. Last bowel movement and flatus 4 days ago. He reports feeling that his abdomen is more distended. He's never had a problem with abdominal pain, bowel obstruction, or ileus before  Pt admitted with ileus.   s/p Procedure(s) on 04/10/16: SIGMOID  COLECTOMY WITH COLOSTOMY (N/A) BIOPSY Mesentary  Per MD notes, pathology revealed adenocarcinoma.  Spoke with pt and pt daughter at bedside. Pt reports he had a poor appetite related to abdominal pain and distention 3-4 days PTA. He generally has a a very good appetite, consuming 2-3 meals per day. Per pt daughter, pt eats very well and shared he consumed an entire meal at [facility] one day PTA.   Pt denies any significant wt loss other than "from not eating since I've been here". He estimates UBW is around 240#.   Pt with NGT to low intermittent suction. He is NPO, but allowed ice pops per discussion with RN. Pt was consuming a mango ice pop at time of visit without difficulty. RN shares that pt will likely have diet advanced soon, as he is having stoma output and po intake is being suctioned from NGT.   Nutrition-Focused physical exam completed. Findings are no fat  depletion, no muscle depletion, and no edema.   Labs reviewed.   Diet Order:  Diet clear liquid Room service appropriate? Yes; Fluid consistency: Thin  Skin:  Reviewed, no issues  Last BM:  04/11/16  Height:   Ht Readings from Last 1 Encounters:  04/02/16 6\' 5"  (1.956 m)    Weight:   Wt Readings from Last 1 Encounters:  04/02/16 234 lb (106.1 kg)    Ideal Body Weight:  94.5 kg  BMI:  Body mass index is 27.75 kg/m.  Estimated Nutritional Needs:   Kcal:  6384-6659  Protein:  130-145 grams  Fluid:  >2.3 L  EDUCATION NEEDS:   No education needs identified at this time  Elynn Patteson A. Jimmye Norman, RD, LDN, CDE Pager: [phone] After hours Pager: [phone]

## 2016-04-14 NOTE — Consult Note (Addendum)
  [place] Nurse ostomy follow up Stoma type/location:  Stoma to LUQ Stomal assessment/size: Stoma red and viable, flush with skin level, 1 1/4 inch and round Peristomal assessment: intact skin surrounding Output: Mod amt liquid brown stool  Ostomy pouching: 1pc.  Education provided: Current pouch had leaked behind the ostomy barrier where it was not visible and soiled the post-op honeycomb dressing over the midline staples site.  Cleaned abd wound and applied ABD pad and tape after surgical PA assessed the site.  Staples are well-approximated and there is no drainage, erythremia, or fluctuance. Demonstrated pouch change to patient and his wife at the bedside using one piece pouch with barrier ring to assist with maintaining a seal. Pt is able to open and close velcro to empty and asked appropriate questions. Reviewed pouching routines and ordering supplies. Extra pouches and barrier rings at the bedside for staff nurse use. Enrolled patient in [place] Start Discharge program: Yes Julien Girt MSN, RN, [place], [place], [place]

## 2016-04-14 NOTE — Progress Notes (Signed)
Large amount yellow greenish drainage from lower end abdominal wound, saturating gown and bed linen X 2. ABD and 4x4 applied for reinforcement and borders secured with medipore tape.

## 2016-04-14 NOTE — Progress Notes (Signed)
   Subjective: Patient denies having any pain. Stool present in his colostomy bag. Denies any nausea or vomiting. Overall doing better.    Objective:  Vital signs in last 24 hours: Vitals:   04/13/16 1032 04/13/16 1500 04/13/16 2019 04/14/16 0610  BP: 118/63 127/72 125/74 136/68  Pulse: 83 80 80 85  Resp:   19 18  Temp:  97.6 F (36.4 C) 98 F (36.7 C) 98.6 F (37 C)  TempSrc:  Oral Oral Oral  SpO2:  100% 100% 96%  Weight:      Height:       Physical Exam  Constitutional: He is oriented to person, place, and time. He appears well-developed and well-nourished.  HENT:  Head: Normocephalic and atraumatic.  Cardiovascular: Normal rate and regular rhythm.  Exam reveals no friction rub.   No murmur heard. Respiratory: Effort normal and breath sounds normal. No respiratory distress. He has no wheezes.  GI: He exhibits distension. There is no tenderness. There is no guarding.  Colostomy bag present. Surgical site without erythema or signs of infection. His bowel sounds are present. His abdomen remains distended. No tenderness to palpation around colostomy site.   Musculoskeletal: He exhibits no edema.  Neurological: He is oriented to person, place, and time.     Assessment/Plan:  Active Problems:   Ileus (HCC)   Essential hypertension   Acute kidney injury superimposed on chronic kidney disease Stage 2 (HCC)   Enterocolitis   Aorto-iliac atherosclerosis (HCC)   Elevated fasting glucose   Right inguinal hernia   Adenocarcinoma of colon ([place])  1. Small bowel obstruction secondary to adenocarcinoma of the proximal sigmoid colon The patient is postop day #4. He is status post partial colectomy with colostomy. He has remained afebrile. His pain is well controlled. NGT clamped. Oncology has been consulted and we will follow up with their recommendations. For now, we will continue to work on pain control and electrolyte balance.Oncology recommending PET scan for staging as outpatient.  Recommending chemo to be started 4-8 weeks after surgery, for now focus on recovery from surgery.  -- Advance diet -- NS + 3meqK @75CC /hr -- Zofran PRN -- Hydromorphone 1 mg q1 prn  -- CEA pending  -- Will need to f/u with oncology as outpatient   2. Acute on chronic kidney injury The patient's most recent creatinine was 2.22. Prior was 2.7. Elevated creatinine likely pre-renal in the setting of recent surgery and NG tube on suction. The patient has baseline chronic kidney disease stage II and at time of presentation had an acute kidney injury complicating his underlying renal disease. -- Maintenance IV fluids as above -- BMP daily  3. Electrolyte imbalance --Hypokalemia, will add K to IV fluids 103meq added   4. Hypertension BP stable. Most recent BP of 136/68 -- Hold home amlodipine -- Restart home labetalol @ 300mg  BID  5. Prediabetes The patient has hemoglobin A1c of 6.3 which makes the patient prediabetic. -- Discuss lifestyle and diet modifications in the outpatient setting  DVT/PE prophylaxis: Lovenox FEN/GI:Clear liquid diet  Dispo: Anticipated discharge pending medical course.  Ophelia Shoulder, MD 04/14/2016, 8:19 AM Pager: [phone]

## 2016-04-14 NOTE — Progress Notes (Signed)
4 Days Post-Op  Subjective: He had a leak from his colostomy bag last PM and it was dressed as noted above.  Fortunately the wound ostomy team has already seen him and cleaned it up.  Wound was contaminated with stool, but is clean and looks fine now.  Ostomy was just changed but working well.  I pulled the NG this AM for him.    Objective: Vital signs in last 24 hours: Temp:  [97.6 F (36.4 C)-98.6 F (37 C)] 98.6 F (37 C) (12/11 0610) Pulse Rate:  [80-85] 85 (12/11 0610) Resp:  [18-19] 18 (12/11 0610) BP: (118-136)/(63-74) 136/68 (12/11 0610) SpO2:  [96 %-100 %] 96 % (12/11 0610) Last BM Date: 04/13/16 PO 160 recorded  1333 IV recorded Voided x 5 NG/ emesis 200 recorded 1775 from the ostomy recorded Afebrile, VSS Creatinine better, K+ low at 2.5 Intake/Output from previous day: 12/10 0701 - 12/11 0700 In: 1653.8 [P.O.:160; I.V.:1333.8; IV Piggyback:160] Out: 1975 [Emesis/NG output:200; UUVOZ:3664] Intake/Output this shift: No intake/output data recorded.  General appearance: alert, cooperative and no distress Resp: clear, this AM, better than yesterday. GI: soft, much less distended, Incision is clean and looks fine now.  Ostomy looks good and working well.  Lab Results:   Recent Labs  04/12/16 0239  WBC 9.8  HGB 10.2*  HCT 30.9*  PLT 159    BMET  Recent Labs  04/12/16 0239 04/14/16 0613  NA 136 138  K 3.8 2.9*  CL 101 101  CO2 26 26  GLUCOSE 169* 124*  BUN 25* 14  CREATININE 2.80* 2.22*  CALCIUM 7.8* 8.4*   PT/INR No results for input(s): LABPROT, INR in the last 72 hours.  No results for input(s): AST, ALT, ALKPHOS, BILITOT, PROT, ALBUMIN in the last 168 hours.   Lipase     Component Value Date/Time   LIPASE 19 04/02/2016 0719     Studies/Results: No results found.  Medications: . amLODipine  10 mg Oral Daily  . enoxaparin (LOVENOX) injection  40 mg Subcutaneous Q24H  . labetalol  100 mg Oral Once  . labetalol  300 mg Oral BID  .  methocarbamol (ROBAXIN)  IV  500 mg Intravenous Q8H  . ramelteon  8 mg Oral QHS   . 0.9 % sodium chloride with kcl     Colon, biopsy, Descending  - DR. Fuller Plan 04/09/16 - ADENOCARCINOMA.  Assessment/Plan Obstructing colon tumor in the distal descending colon with peritoneal single 2.63mm implant SIGMOID COLECTOMY WITH COLOSTOMY, BIOPSY Mesentary, 04/10/16, Dr. Hulen Skains Chronic constipation Hypertension Chronic kidney disease - creatinine rising 2.58 today Hypomagnesemia - resolved HYpokalemia- does not want oral supplement.  Changing IV fluids to NS+30meq KCl Elevated glucose still an issue  Hemoglobin A1C 6.3 on admit - defer to Medicine FEN: IV fluids/clears ID:  Pre op only DVT: SCD/Lovenox   Plan:  Advance diet, keep wound clean and watch closely.  I have pulled the NG. He does not want oral K+ so I am changing IV fluids and treating this was.      LOS: 11 days    Tresha Muzio 04/14/2016 [phone]

## 2016-04-14 NOTE — Care Management Note (Addendum)
Case Management Note  Patient Details  Name: Louis [name] MRN: [id number] Date of Birth: Feb 18, 1946  Subjective/Objective:                    Action/Plan:  Patient does not want 3 in 1 ( bedside commode)  Spoke with patient's daughter Clarene Critchley ( RN on 4N) , her father would like [place] for [facility] and she is requesting 3 in 1 same ordered.  Expected Discharge Date:                  Expected Discharge Plan:  [place]  In-House Referral:     Discharge planning Services  CM Consult  Post Acute Care Choice:  Home Health Choice offered to:  Patient, Spouse, Adult Children  DME Arranged:  3-N-1 DME Agency:  [place]:  RN [facility] Agency:  [place]  Status of Service:  Completed, signed off  If discussed at [place] of Stay Meetings, dates discussed:    Additional Comments:  Marilu Favre, RN 04/14/2016, 12:33 PM

## 2016-04-14 NOTE — Care Management Note (Signed)
Case Management Note  Patient Details  Name: Louis [name] MRN: [id number] Date of Birth: Feb 22, 1946  Subjective/Objective:                    Action/Plan: Discussed home health RN with patient and wife. Provided list of agencies . They will think about which agency . Will continue to follow .   Expected Discharge Date:                  Expected Discharge Plan:  [place]  In-House Referral:     Discharge planning Services  CM Consult  Post Acute Care Choice:  Home Health Choice offered to:  Patient, Spouse  DME Arranged:    DME Agency:     HH Arranged:  RN [place] Agency:     Status of Service:  In process, will continue to follow  If discussed at Long Length of Stay Meetings, dates discussed:    Additional Comments:  Marilu Favre, RN 04/14/2016, 10:40 AM

## 2016-04-15 ENCOUNTER — Telehealth: Payer: Self-pay | Admitting: Hematology

## 2016-04-15 LAB — BASIC METABOLIC PANEL
Anion gap: 12 (ref 5–15)
BUN: 12 mg/dL (ref 6–20)
CALCIUM: 8.5 mg/dL — AB (ref 8.9–10.3)
CO2: 20 mmol/L — AB (ref 22–32)
CREATININE: 2.15 mg/dL — AB (ref 0.61–1.24)
Chloride: 106 mmol/L (ref 101–111)
GFR, EST AFRICAN AMERICAN: 34 mL/min — AB (ref >60)
GFR, EST NON AFRICAN AMERICAN: 30 mL/min — AB (ref >60)
Glucose, Bld: 110 mg/dL — ABNORMAL HIGH (ref 65–99)
Potassium: 3.3 mmol/L — ABNORMAL LOW (ref 3.5–5.1)
Sodium: 138 mmol/L (ref 135–145)

## 2016-04-15 MED ORDER — POTASSIUM CHLORIDE CRYS ER 10 MEQ PO TBCR
10.0000 meq | EXTENDED_RELEASE_TABLET | Freq: Three times a day (TID) | ORAL | Status: DC
  Administered 2016-04-15: 10 meq via ORAL
  Filled 2016-04-15: qty 1

## 2016-04-15 NOTE — Progress Notes (Signed)
5 Days Post-Op  Subjective: He looks good and would like to consider going home today.  He has not had the soft diet yet nor tried much for pain PO.  Ostomy working well with stool and gas in the bag.    Objective: Vital signs in last 24 hours: Temp:  [97.9 F (36.6 C)-99.2 F (37.3 C)] 97.9 F (36.6 C) (12/12 0514) Pulse Rate:  [77-82] 79 (12/12 0514) Resp:  [18] 18 (12/12 0514) BP: (109-140)/(62-80) 109/62 (12/12 0514) SpO2:  [98 %-99 %] 98 % (12/12 0514) Last BM Date: 04/14/16 240 PO recorded 1500 IV Urine - voided x 5 Stool 1800 Afebrile, VSS Labs still pending Intake/Output from previous day: 12/11 0701 - 12/12 0700 In: 1737.5 [P.O.:240; I.V.:1497.5] Out: 1800 [Stool:1800] Intake/Output this shift: No intake/output data recorded.  General appearance: alert, cooperative and no distress Resp: clear to auscultation bilaterally GI: soft, sore, ostomy working well, incision looks fine.  Lab Results:  No results for input(s): WBC, HGB, HCT, PLT in the last 72 hours.  BMET  Recent Labs  04/14/16 0613  NA 138  K 2.9*  CL 101  CO2 26  GLUCOSE 124*  BUN 14  CREATININE 2.22*  CALCIUM 8.4*   PT/INR No results for input(s): LABPROT, INR in the last 72 hours.  No results for input(s): AST, ALT, ALKPHOS, BILITOT, PROT, ALBUMIN in the last 168 hours.   Lipase     Component Value Date/Time   LIPASE 19 04/02/2016 0719     Studies/Results: No results found.  Medications: . acetaminophen  1,000 mg Oral Q6H  . amLODipine  10 mg Oral Daily  . enoxaparin (LOVENOX) injection  40 mg Subcutaneous Q24H  . labetalol  100 mg Oral Once  . labetalol  300 mg Oral BID  . ramelteon  8 mg Oral QHS    Assessment/Plan Obstructing colon tumor in the distal descending colon with peritoneal single 2.26mm implant SIGMOID COLECTOMY WITH COLOSTOMY, BIOPSY Mesentary, 04/10/16, Dr. Hulen Skains POD #5 Chronic constipation Hypertension Chronic kidney disease - creatinine rising 2.58  today Hypomagnesemia - resolved HYpokalemia- does not want oral supplement.  Changing IV fluids to NS+38meq KCl - labs still pending Elevated glucose still an issue Hemoglobin A1C 6.3 on admit - defer to Medicine FEN: IV fluids/soft ID: Pre op only DVT: SCD/Lovenox    Plan:  He is not taking anything for pain, refusing even Tylenol.  Labs are pending.  I am going to cut down his IV fluids to KO for now till we get labs back.  From surgery standpoint he is doing well   K+ still now up, creatinine is a little better.  I will add PO kcl hopefully he will take small pills  LOS: 12 days    Louis [name] 04/15/2016 [phone]

## 2016-04-15 NOTE — Telephone Encounter (Signed)
[facility]osp [facility] appt scheduled w/[facility] on 05/09/16 at 230pm. Spoke to the patient't wife and gave the appt date and time. Agreed to the appt.

## 2016-04-15 NOTE — Progress Notes (Signed)
Discharge instructions reviewed with pt and pt's wife.  Pt and pt's wife verbalized understanding and questions answered.  Pt discharged in stable condition via wheelchair with wife.  Eliezer [place]

## 2016-04-15 NOTE — Discharge Summary (Signed)
Name: Louis [name] MRN: [id number] DOB: 01/09/1946 70 y.o. PCP: Donato Heinz, MD  Date of Admission: 04/02/2016  7:34 AM Date of Discharge: 04/15/2016 Attending Physician: Lucious Groves, DO  Discharge Diagnosis: 1. Large bowel obstruction secondary to adenocarcinoma 2. Adenocarcinoma of the colon Principal Problem:   Adenocarcinoma of colon ([place]) Active Problems:   Ileus ([place])   Essential hypertension   Acute kidney injury superimposed on chronic kidney disease Stage 2 (HCC)   Enterocolitis   Aorto-iliac atherosclerosis (HCC)   Elevated fasting glucose   Right inguinal hernia   Discharge Medications:   Medication List    TAKE these medications   amLODipine 10 MG tablet Commonly known as:  NORVASC Take 10 mg by mouth daily.   labetalol 300 MG tablet Commonly known as:  NORMODYNE Take 300 mg by mouth 2 (two) times daily.   loperamide 2 MG capsule Commonly known as:  IMODIUM Take 2-4 mg by mouth daily as needed.   omega-3 acid ethyl esters 1 g capsule Commonly known as:  LOVAZA Take 1 capsule by mouth daily.   promethazine 25 MG tablet Commonly known as:  PHENERGAN Take 25 mg by mouth every 6 (six) hours as needed for nausea or vomiting.            Durable Medical Equipment        Start     Ordered   04/15/16 1359  For home use only DME 3 n 1  Once     04/15/16 1358      Disposition and follow-up:   Louis [name] was discharged from [facility] in Good condition.  At the hospital follow up visit please address:  1.  Please ensure the patient follows up with oncology. Please ensure the patient follows up with the surgery.  2.  Labs / imaging needed at time of follow-up: Repeat basic metabolic panel. Patient will need positron emission tomography for staging.  3.  Pending labs/ test needing follow-up: None  Follow-up Appointments: Follow-up [facility], [place]. Go on 04/22/2016.     Specialty:  General Surgery Why:  appointment time is 10:00AM please arrive 30 minutes prior to complete paperwork Contact information: [address] [place]       Judeth Horn, MD. Go on 05/06/2016.   Specialty:  General Surgery Why:  appointment time is 10:30AM Contact information: [place] [address] [phone]        Truitt Merle, MD. Go on 05/09/2016.   Specialties:  Hematology, Oncology Why:  appointment time is 2:30PM Contact information: [place] [address] [phone]        Wayne Both, MD. Go on 04/24/2016.   Specialty:  Oncology Why:  appointment time is 12:30PM, please arrive 30 minutes prior to complete registration Contact information: [id number], [place] [address] [phone]           Hospital Course by problem list: Principal Problem:   Adenocarcinoma of colon [facility]) Active Problems:   Ileus ([place])   Essential hypertension   Acute kidney injury superimposed on chronic kidney disease Stage 2 ([place])   Enterocolitis   Aorto-iliac atherosclerosis ([place])   Elevated fasting glucose   Right inguinal hernia   1. Large bowel obstruction secondary to adenocarcinoma The patient presented to the [facility] emergency department on 04/02/2016  with a 4 day history of nausea and vomiting with decreased appetite. At the time of presentation the patient reports similar symptoms occurring approximately 2-3 weeks earlier. Upon arrival to the emergency department the patient had a CT scan which demonstrated multiple loops of dilated small and large bowel with fluid throughout much of the bowel. A well-defined transition zone was not apparent on the CT scan. The appearance was consistent with ileus most likely thought to be post viral in etiology. The patient was then admitted to the [facility] internal  medicine teaching service for further workup and management. Once on service conservative measures were taken and the patient was made nothing by mouth and treated with IV fluids and symptom management. Over the course of his stay he did not improve and eventually developed nonbloody nonbilious emesis. At this time gastroenterology was consult and a Gastrografin enema was performed. Results of this study demonstrated an apple core lesion. He was then taken for flex sigmoidoscopy which demonstrated an obstructing mass. Biopsies were taken. Results of the biopsy demonstrated adenocarcinoma. He was subsequently taken to the operating room on 04/10/2016 and underwent a partial colectomy with colostomy placement. During the operation a single metastasis was identified. For additional details on his cancer please see below under point of her 2. Following the surgery the patient recovered appropriately. On the day of discharge the patient's pain was completely controlled without pain medications and he was able to tolerate good by mouth intake. He had no signs of infection in the surgical site appeared to be healing appropriately. He will have follow-up with surgery in the outpatient setting.  2. Adenocarcinoma of the colon Briefly, as mentioned above the patient presented with what appeared to be a postviral ileus which on further workup was determined to be a  bowel obstruction secondary to a colonic adenocarcinoma. The patient underwent a surgical partial colectomy with colostomy placement. During the operation a single lesion was identified in the mesentery which was biopsied and returned as adenocarcinoma. On the day of discharge the patient was aware of his diagnosis. He was also made aware that he had a metastatic site located within the mesentery. Discharge Vitals:   BP 119/88 (BP Location: Left Arm)   Pulse 77   Temp 98.5 F (36.9 C) (Oral)   Resp 19   Ht 6\' 5"  (1.956 m)   Wt 234 lb (106.1 kg)   SpO2  100%   BMI 27.75 kg/m   Pertinent Labs, Studies, and Procedures:  1. CT abdomen and pelvis results are as follows: Multiple loops of dilated small and large bowel with fluid throughout much of the bowel. A well-defined transition zone is not seen. There is no appreciable bowel wall thickening. No bowel pneumatosis evident. The appearance is consistent with ileus, likely with associated enterocolitis.  There is a right inguinal hernia which contains a portion of the urinary bladder. There is wall thickening in much of the urinary bladder suggesting a degree of cystitis.  Prostate appears prominent and cannot be separated from the inferior bladder. This finding warrants direct physical examination and correlation with PSA. Prostate carcinoma in this circumstance cannot be excluded by CT.  There is aortoiliac atherosclerosis. There are foci of coronary artery calcification.  Gallbladder appears somewhat distended without wall thickening.  Renal cysts bilaterally. Largest cyst arises from the left kidney measuring 7.0 x 6.1 cm.  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  2. Partial colectomy with colostomy   Discharge Instructions    Diet -  low sodium heart healthy    Complete by:  As directed    Discharge instructions    Complete by:  As directed    It was a pleasure taking care of you while in the hospital.  Please ensure you follow-up with your surgeons and the oncologist.  I would suggest taking an over-the-counter multivitamin that includes potassium.   Increase activity slowly    Complete by:  As directed       Signed: Ophelia Shoulder, MD 04/15/2016, 4:13 PM   Pager: [phone]

## 2016-04-15 NOTE — Progress Notes (Signed)
Initial Nutrition Assessment  DOCUMENTATION CODES:   Not applicable  INTERVENTION:   -Continue with regular diet  NUTRITION DIAGNOSIS:   Inadequate oral intake related to altered GI function as evidenced by  (NPO/CLD x7 days).  Resolved  GOAL:   Patient will meet greater than or equal to 90% of their needs  Met  MONITOR:   Diet advancement, Labs, Weight trends, Skin, I & O's  REASON FOR ASSESSMENT:   NPO/Clear Liquid Diet    ASSESSMENT:   70 year old man with CKD came to the emergency department because of increasing abdominal pain and distention. He reports nausea and vomiting 4 days ago, subsequently has had severely reduced appetite. Last bowel movement and flatus 4 days ago. He reports feeling that his abdomen is more distended. He's never had a problem with abdominal pain, bowel obstruction, or ileus before  s/p Procedure(s) on 04/10/16: SIGMOID COLECTOMY WITH COLOSTOMY (N/A) BIOPSY Mesentary  NGT pulled on 04/14/16. Pt currently on a regular diet. Case discussed with RN, who reports pt with very good appetite; he is requesting a Big Mac with fries.   Oncology following and recommending PET scan as outpatient and initiation of chemo 4-6 weeks after surgery.   Per RN, discharge likely tomorrow.   Labs reviewed: K: 3.3 (on PO supplementation), CBGS: 123.   Diet Order:  Diet regular Room service appropriate? Yes; Fluid consistency: Thin  Skin:  Reviewed, no issues  Last BM:  04/14/16  Height:   Ht Readings from Last 1 Encounters:  04/02/16 '6\' 5"'  (1.956 m)    Weight:   Wt Readings from Last 1 Encounters:  04/02/16 234 lb (106.1 kg)    Ideal Body Weight:  94.5 kg  BMI:  Body mass index is 27.75 kg/m.  Estimated Nutritional Needs:   Kcal:  5602-7829  Protein:  130-145 grams  Fluid:  >2.3 L  EDUCATION NEEDS:   No education needs identified at this time  Kirsi Hugh A. Jimmye Norman, RD, LDN, CDE Pager: [phone] After hours Pager: [phone]

## 2016-04-15 NOTE — Progress Notes (Signed)
   Subjective: Patient w/o pain. No n/v. Diet advanced and tolerating soft foods this AM. We discussed with him that his cancer had spread and it is metastatic. We answered all questions we were able to regarding his malignancy. He says he feels ready and is hopeful to go home today.    Objective:  Vital signs in last 24 hours: Vitals:   04/14/16 1006 04/14/16 1300 04/14/16 2101 04/15/16 0514  BP: 136/68 140/73 128/80 109/62  Pulse: 82 77 78 79  Resp:   18 18  Temp:  99.2 F (37.3 C) 98.1 F (36.7 C) 97.9 F (36.6 C)  TempSrc:  Oral Oral Oral  SpO2:  98% 99% 98%  Weight:      Height:       Physical Exam  Constitutional: He is oriented to person, place, and time. He appears well-developed and well-nourished.  HENT:  Head: Normocephalic and atraumatic.  Cardiovascular: Normal rate and regular rhythm.  Exam reveals no friction rub.   No murmur heard. Respiratory: Effort normal and breath sounds normal. No respiratory distress. He has no wheezes.  GI: He exhibits distension. There is no tenderness. There is no guarding.  Colostomy bag present. Surgical site without erythema or signs of infection. His bowel sounds are present. His abdomen remains distended. No tenderness to palpation around colostomy site.   Musculoskeletal: He exhibits no edema.  Neurological: He is oriented to person, place, and time.     Assessment/Plan:  Active Problems:   Ileus (HCC)   Essential hypertension   Acute kidney injury superimposed on chronic kidney disease Stage 2 (HCC)   Enterocolitis   Aorto-iliac atherosclerosis (HCC)   Elevated fasting glucose   Right inguinal hernia   Adenocarcinoma of colon ([place])   In summary, patient w/o n./v or abdominal pain. Tolerating a soft diet this morning. Pathology shows metastatic disease/ Will have f/u with oncology for PET imaging and initiation of chemotherpay. Most likely will need FOLFOX.    1. Small bowel obstruction secondary to adenocarcinoma of  the proximal sigmoid colon The patient is postop day #5. He is status post partial colectomy with colostomy. He has remained afebrile. His pain is well controlled. NGT removed yesterday. Tolerating soft diet this morning. Will f/u with oncology and will most likely need FOLFOX chemotherapy for metastatic adenocarcinoma.   -- Advance diet -- NS + 75meqK @10CC /hr -- Zofran PRN -- CEA- 5 -- Will need to f/u with oncology as outpatient  -- Path shows metastatic adenocarcinoma   2. Acute on chronic kidney injury The patient's most recent creatinine was 2.15. Prior was 2.7. Elevated creatinine likely pre-renal in the setting of recent surgery and NG tube on suction. The patient has baseline chronic kidney disease stage II and at time of presentation had an acute kidney injury complicating his underlying renal disease. -- Maintenance IV fluids as above -- BMP daily  3. Electrolyte imbalance --Hypokalemia, will add K to IV fluids 47meq added   4. Hypertension BP stable. Most recent BP of 109/62 -- Amlodipine 10mg   -- Restart home labetalol @ 300mg  BID  5. Prediabetes The patient has hemoglobin A1c of 6.3 which makes the patient prediabetic. -- Discuss lifestyle and diet modifications in the outpatient setting  DVT/PE prophylaxis: Lovenox FEN/GI: Soft  Dispo: Anticipated discharge this afternoon or tomorrow AM pending surgery recs.   Ophelia Shoulder, MD 04/15/2016, 9:46 AM Pager: [phone]

## 2016-04-15 NOTE — Discharge Instructions (Signed)
[facility], [place] [phone]  OPEN ABDOMINAL SURGERY: POST OP INSTRUCTIONS  Always review your discharge instruction sheet given to you by the facility where your surgery was performed.  IF YOU HAVE DISABILITY OR FAMILY LEAVE FORMS, YOU MUST BRING THEM TO THE OFFICE FOR PROCESSING.  PLEASE DO NOT GIVE THEM TO YOUR DOCTOR.  1. A prescription for pain medication may be given to you upon discharge.  Take your pain medication as prescribed, if needed.  If narcotic pain medicine is not needed, then you may take acetaminophen (Tylenol) or ibuprofen (Advil) as needed. 2. Take your usually prescribed medications unless otherwise directed. 3. If you need a refill on your pain medication, please contact your pharmacy. They will contact our office to request authorization.  Prescriptions will not be filled after 5pm or on week-ends. 4. You should follow a light diet the first few days after arrival home, such as soup and crackers, pudding, etc.unless your doctor has advised otherwise. A high-fiber, low fat diet can be resumed as tolerated.   Be sure to include lots of fluids daily. Most patients will experience some swelling and bruising on the chest and neck area.  Ice packs will help.  Swelling and bruising can take several days to resolve 5. Most patients will experience some swelling and bruising in the area of the incision. Ice pack will help. Swelling and bruising can take several days to resolve..  6. It is common to experience some constipation if taking pain medication after surgery.  Increasing fluid intake and taking a stool softener will usually help or prevent this problem from occurring.  A mild laxative (Milk of Magnesia or Miralax) should be taken according to package directions if there are no bowel movements after 48 hours. 7.  You may have steri-strips (small skin tapes) in place directly over the incision.  These strips should be left on the skin for 7-10 days.  If your  surgeon used skin glue on the incision, you may shower in 24 hours.  The glue will flake off over the next 2-3 weeks.  Any sutures or staples will be removed at the office during your follow-up visit. You may find that a light gauze bandage over your incision may keep your staples from being rubbed or pulled. You may shower and replace the bandage daily. 8. ACTIVITIES:  You may resume regular (light) daily activities beginning the next day--such as daily self-care, walking, climbing stairs--gradually increasing activities as tolerated.  You may have sexual intercourse when it is comfortable.  Refrain from any heavy lifting or straining until approved by your doctor. a. You may drive after 2 weeks and when you no longer are taking prescription pain medication, you can comfortably wear a seatbelt, and you can safely maneuver your car and apply brakes 9. You should see your doctor in the office for a follow-up appointment approximately two weeks after your surgery.  Make sure that you call for this appointment within a day or two after you arrive home to insure a convenient appointment time. OTHER INSTRUCTIONS:  No driving for 2 weeks, no heavy lifting for 6 weeks, you may use stairs, you may shower but keep the ostomy covered.   WHEN TO CALL YOUR DOCTOR: 1. Fever over 101.0 2. Inability to urinate 3. Nausea and/or vomiting 4. Extreme swelling or bruising 5. Continued bleeding from incision. 6. Increased pain, redness, or drainage from the incision. 7. Difficulty swallowing or breathing 8. Muscle  cramping or spasms. 9. Numbness or tingling in hands or feet or around lips.  The clinic staff is available to answer your questions during regular business hours.  Please dont hesitate to call and ask to speak to one of the nurses if you have concerns.  For further questions, please visit [email or web address]

## 2016-04-15 NOTE — Care Management (Signed)
Patient has decided he does want 3 in 1 . Same ordered. Magdalen Spatz RN BSN [phone]

## 2016-04-15 NOTE — Consult Note (Addendum)
[name] Nurse ostomy follow up Stoma type/location:  Stoma to LUQ Stomal assessment/size: Stoma red and viable, flush with skin level, 1 1/4 inch and round Output: Mod amt liquid brown stool  Ostomy pouching: 1pc.  Pt requesting for his physician to discharge him home today.  Declined offer of another pouch change demonstration.  States; "I feel like I will be able to do this when I can look in the mirror."  States he has emptied the pouch without assistance.  Educational materials left at the bedside and 4 extra pouches and barrier rings for discharge home. Reviewed pouching routines and ordering supplies with patient and duaghter in the room.  Denies further questions. Enrolled patient in [place] Start Discharge program: Yes Louis Girt MSN, RN, [place], [place], [place]

## 2016-04-15 NOTE — Care Management Important Message (Signed)
Important Message  Patient Details  Name: Louis [name] MRN: [id number] Date of Birth: 12/10/1945   Medicare Important Message Given:  Yes    Orbie Pyo 04/15/2016, 12:36 PM

## 2016-04-17 DIAGNOSIS — C189 Malignant neoplasm of colon, unspecified: Secondary | ICD-10-CM | POA: Diagnosis not present

## 2016-04-17 DIAGNOSIS — K529 Noninfective gastroenteritis and colitis, unspecified: Secondary | ICD-10-CM | POA: Diagnosis not present

## 2016-04-17 DIAGNOSIS — Z433 Encounter for attention to colostomy: Secondary | ICD-10-CM | POA: Diagnosis not present

## 2016-04-17 DIAGNOSIS — K4090 Unilateral inguinal hernia, without obstruction or gangrene, not specified as recurrent: Secondary | ICD-10-CM | POA: Diagnosis not present

## 2016-04-17 DIAGNOSIS — N182 Chronic kidney disease, stage 2 (mild): Secondary | ICD-10-CM | POA: Diagnosis not present

## 2016-04-17 DIAGNOSIS — Z483 Aftercare following surgery for neoplasm: Secondary | ICD-10-CM | POA: Diagnosis not present

## 2016-04-17 DIAGNOSIS — I129 Hypertensive chronic kidney disease with stage 1 through stage 4 chronic kidney disease, or unspecified chronic kidney disease: Secondary | ICD-10-CM | POA: Diagnosis not present

## 2016-04-21 DIAGNOSIS — C189 Malignant neoplasm of colon, unspecified: Secondary | ICD-10-CM | POA: Diagnosis not present

## 2016-04-21 DIAGNOSIS — Z483 Aftercare following surgery for neoplasm: Secondary | ICD-10-CM | POA: Diagnosis not present

## 2016-04-21 DIAGNOSIS — Z433 Encounter for attention to colostomy: Secondary | ICD-10-CM | POA: Diagnosis not present

## 2016-04-21 DIAGNOSIS — I129 Hypertensive chronic kidney disease with stage 1 through stage 4 chronic kidney disease, or unspecified chronic kidney disease: Secondary | ICD-10-CM | POA: Diagnosis not present

## 2016-04-21 DIAGNOSIS — N182 Chronic kidney disease, stage 2 (mild): Secondary | ICD-10-CM | POA: Diagnosis not present

## 2016-04-21 DIAGNOSIS — K529 Noninfective gastroenteritis and colitis, unspecified: Secondary | ICD-10-CM | POA: Diagnosis not present

## 2016-04-23 DIAGNOSIS — N2581 Secondary hyperparathyroidism of renal origin: Secondary | ICD-10-CM | POA: Diagnosis not present

## 2016-04-23 DIAGNOSIS — E669 Obesity, unspecified: Secondary | ICD-10-CM | POA: Diagnosis not present

## 2016-04-23 DIAGNOSIS — N179 Acute kidney failure, unspecified: Secondary | ICD-10-CM | POA: Diagnosis not present

## 2016-04-23 DIAGNOSIS — C786 Secondary malignant neoplasm of retroperitoneum and peritoneum: Secondary | ICD-10-CM | POA: Diagnosis not present

## 2016-04-23 DIAGNOSIS — I10 Essential (primary) hypertension: Secondary | ICD-10-CM | POA: Diagnosis not present

## 2016-04-23 DIAGNOSIS — E785 Hyperlipidemia, unspecified: Secondary | ICD-10-CM | POA: Diagnosis not present

## 2016-04-23 DIAGNOSIS — N189 Chronic kidney disease, unspecified: Secondary | ICD-10-CM | POA: Diagnosis not present

## 2016-04-23 DIAGNOSIS — C187 Malignant neoplasm of sigmoid colon: Secondary | ICD-10-CM | POA: Diagnosis not present

## 2016-04-23 DIAGNOSIS — N183 Chronic kidney disease, stage 3 (moderate): Secondary | ICD-10-CM | POA: Diagnosis not present

## 2016-04-23 DIAGNOSIS — C186 Malignant neoplasm of descending colon: Secondary | ICD-10-CM | POA: Diagnosis not present

## 2016-04-24 DIAGNOSIS — K668 Other specified disorders of peritoneum: Secondary | ICD-10-CM | POA: Diagnosis not present

## 2016-04-24 DIAGNOSIS — C189 Malignant neoplasm of colon, unspecified: Secondary | ICD-10-CM | POA: Diagnosis not present

## 2016-04-25 DIAGNOSIS — C189 Malignant neoplasm of colon, unspecified: Secondary | ICD-10-CM | POA: Diagnosis not present

## 2016-04-25 DIAGNOSIS — Z483 Aftercare following surgery for neoplasm: Secondary | ICD-10-CM | POA: Diagnosis not present

## 2016-04-25 DIAGNOSIS — N182 Chronic kidney disease, stage 2 (mild): Secondary | ICD-10-CM | POA: Diagnosis not present

## 2016-04-25 DIAGNOSIS — K529 Noninfective gastroenteritis and colitis, unspecified: Secondary | ICD-10-CM | POA: Diagnosis not present

## 2016-04-25 DIAGNOSIS — Z433 Encounter for attention to colostomy: Secondary | ICD-10-CM | POA: Diagnosis not present

## 2016-04-25 DIAGNOSIS — I129 Hypertensive chronic kidney disease with stage 1 through stage 4 chronic kidney disease, or unspecified chronic kidney disease: Secondary | ICD-10-CM | POA: Diagnosis not present

## 2016-04-29 ENCOUNTER — Telehealth: Payer: Self-pay | Admitting: Oncology

## 2016-04-29 DIAGNOSIS — K529 Noninfective gastroenteritis and colitis, unspecified: Secondary | ICD-10-CM | POA: Diagnosis not present

## 2016-04-29 DIAGNOSIS — N182 Chronic kidney disease, stage 2 (mild): Secondary | ICD-10-CM | POA: Diagnosis not present

## 2016-04-29 DIAGNOSIS — C189 Malignant neoplasm of colon, unspecified: Secondary | ICD-10-CM | POA: Diagnosis not present

## 2016-04-29 DIAGNOSIS — Z483 Aftercare following surgery for neoplasm: Secondary | ICD-10-CM | POA: Diagnosis not present

## 2016-04-29 DIAGNOSIS — Z433 Encounter for attention to colostomy: Secondary | ICD-10-CM | POA: Diagnosis not present

## 2016-04-29 DIAGNOSIS — I129 Hypertensive chronic kidney disease with stage 1 through stage 4 chronic kidney disease, or unspecified chronic kidney disease: Secondary | ICD-10-CM | POA: Diagnosis not present

## 2016-04-29 NOTE — Telephone Encounter (Signed)
Appt scheduled w/Dr. Benay Spice on 05/08/16 at 2pm. Pt aware to arrive 30 minutes early. Letter mailed.

## 2016-04-30 DIAGNOSIS — I129 Hypertensive chronic kidney disease with stage 1 through stage 4 chronic kidney disease, or unspecified chronic kidney disease: Secondary | ICD-10-CM | POA: Diagnosis not present

## 2016-04-30 DIAGNOSIS — C189 Malignant neoplasm of colon, unspecified: Secondary | ICD-10-CM | POA: Diagnosis not present

## 2016-04-30 DIAGNOSIS — Z483 Aftercare following surgery for neoplasm: Secondary | ICD-10-CM | POA: Diagnosis not present

## 2016-04-30 DIAGNOSIS — N182 Chronic kidney disease, stage 2 (mild): Secondary | ICD-10-CM | POA: Diagnosis not present

## 2016-04-30 DIAGNOSIS — Z433 Encounter for attention to colostomy: Secondary | ICD-10-CM | POA: Diagnosis not present

## 2016-04-30 DIAGNOSIS — K529 Noninfective gastroenteritis and colitis, unspecified: Secondary | ICD-10-CM | POA: Diagnosis not present

## 2016-05-01 DIAGNOSIS — K529 Noninfective gastroenteritis and colitis, unspecified: Secondary | ICD-10-CM | POA: Diagnosis not present

## 2016-05-01 DIAGNOSIS — Z483 Aftercare following surgery for neoplasm: Secondary | ICD-10-CM | POA: Diagnosis not present

## 2016-05-01 DIAGNOSIS — Z433 Encounter for attention to colostomy: Secondary | ICD-10-CM | POA: Diagnosis not present

## 2016-05-01 DIAGNOSIS — C189 Malignant neoplasm of colon, unspecified: Secondary | ICD-10-CM | POA: Diagnosis not present

## 2016-05-01 DIAGNOSIS — N182 Chronic kidney disease, stage 2 (mild): Secondary | ICD-10-CM | POA: Diagnosis not present

## 2016-05-01 DIAGNOSIS — I129 Hypertensive chronic kidney disease with stage 1 through stage 4 chronic kidney disease, or unspecified chronic kidney disease: Secondary | ICD-10-CM | POA: Diagnosis not present

## 2016-05-05 HISTORY — PX: COLOSTOMY: SHX63

## 2016-05-06 DIAGNOSIS — C189 Malignant neoplasm of colon, unspecified: Secondary | ICD-10-CM | POA: Diagnosis not present

## 2016-05-06 DIAGNOSIS — Z483 Aftercare following surgery for neoplasm: Secondary | ICD-10-CM | POA: Diagnosis not present

## 2016-05-06 DIAGNOSIS — Z433 Encounter for attention to colostomy: Secondary | ICD-10-CM | POA: Diagnosis not present

## 2016-05-06 DIAGNOSIS — N182 Chronic kidney disease, stage 2 (mild): Secondary | ICD-10-CM | POA: Diagnosis not present

## 2016-05-06 DIAGNOSIS — K529 Noninfective gastroenteritis and colitis, unspecified: Secondary | ICD-10-CM | POA: Diagnosis not present

## 2016-05-06 DIAGNOSIS — I129 Hypertensive chronic kidney disease with stage 1 through stage 4 chronic kidney disease, or unspecified chronic kidney disease: Secondary | ICD-10-CM | POA: Diagnosis not present

## 2016-05-07 ENCOUNTER — Telehealth: Payer: Self-pay | Admitting: *Deleted

## 2016-05-07 DIAGNOSIS — C186 Malignant neoplasm of descending colon: Secondary | ICD-10-CM | POA: Diagnosis not present

## 2016-05-07 NOTE — Telephone Encounter (Signed)
Called patient at MD request to move his appointment of 2 pm on 1/4 to 0900. He agrees to the change.

## 2016-05-08 ENCOUNTER — Encounter: Payer: Self-pay | Admitting: *Deleted

## 2016-05-08 ENCOUNTER — Telehealth: Payer: Self-pay | Admitting: Oncology

## 2016-05-08 ENCOUNTER — Telehealth: Payer: Self-pay | Admitting: *Deleted

## 2016-05-08 ENCOUNTER — Ambulatory Visit (HOSPITAL_BASED_OUTPATIENT_CLINIC_OR_DEPARTMENT_OTHER): Payer: Medicare Other | Admitting: Oncology

## 2016-05-08 VITALS — BP 154/75 | HR 76 | Temp 98.3°F | Resp 18 | Ht 77.0 in | Wt 222.2 lb

## 2016-05-08 DIAGNOSIS — N182 Chronic kidney disease, stage 2 (mild): Secondary | ICD-10-CM

## 2016-05-08 DIAGNOSIS — C187 Malignant neoplasm of sigmoid colon: Secondary | ICD-10-CM | POA: Diagnosis not present

## 2016-05-08 DIAGNOSIS — I10 Essential (primary) hypertension: Secondary | ICD-10-CM | POA: Diagnosis not present

## 2016-05-08 DIAGNOSIS — Z23 Encounter for immunization: Secondary | ICD-10-CM

## 2016-05-08 DIAGNOSIS — C189 Malignant neoplasm of colon, unspecified: Secondary | ICD-10-CM

## 2016-05-08 MED ORDER — INFLUENZA VAC SPLIT QUAD 0.5 ML IM SUSY
0.5000 mL | PREFILLED_SYRINGE | Freq: Once | INTRAMUSCULAR | Status: AC
  Administered 2016-05-08: 0.5 mL via INTRAMUSCULAR
  Filled 2016-05-08: qty 0.5

## 2016-05-08 NOTE — Telephone Encounter (Signed)
Appointments scheduled per 05/08/16 los. Patient was given a copy of the AVS report and appointment schedule per 05/08/16 los. °

## 2016-05-08 NOTE — Telephone Encounter (Signed)
Oncology Nurse Navigator Documentation  Oncology Nurse Navigator Flowsheets 05/08/2016  Navigator Location [facility]  Referral date to RadOnc/MedOnc -  Navigator Encounter Type Telephone  Telephone Outgoing Call;Appt Confirmation/Clarification  Abnormal Finding Date -  Confirmed Diagnosis Date -  Surgery Date -  Patient Visit Type -  Treatment Phase -  Barriers/Navigation Needs Coordination of Care  Education -  Interventions Coordination of Care  Coordination of Care Radiology--rescheduled CT chest for 1/15 at 1245/1:00 at [facility]  Education Method Verbal;Teach-back  Support Groups/Services -  Acuity -  Time Spent with Patient 30  Had spoken to wife earlier today and she had requested change on port placement for 1/8 due to patient's brother coming to visit from [place]. Chemo class had been scheduled for 1/9 and she requested this be moved due to her hair appointment. Also requested not to have any appointments on 1/11 morning. Also requesting to change chemo start date from 1/10 to 1/17. Made her aware that MD feels it is in his best interest to start the chemo within 6 weeks of the surgery and he preferred week on 1/8. She still insisted on 1/17--reschedule was completed. Called patient back and reviewed all the appointments with him and where he is to go for each. He reports it looks OK for him.

## 2016-05-08 NOTE — Patient Instructions (Signed)
Care Plan Summary- 05/08/2016 Name: Louis [name]       DOB:  18-Nov-1945 Your Medical Team:  Medical Oncologist:  Dr. Ma Rings Radiation Oncologist:   Surgeon:   Dr. Judeth Horn Type of Cancer: Adenocarcinoma of Colon  Stage/Grade: Stage IV *Exact staging of your cancer is based on size of the tumor, depth of invasion, involvement of lymph nodes or not, and whether or not the cancer has spread beyond the primary site   Recommendations: Based on information available as of today's consult. Recommendations may change depending on the results of further tests or exams. 1) Complete staging with CT chest 2) FOLFOX (Oxaliplatin, Leucovorin, 5-Flourouacil) every 2 weeks x 12 cycles (6 months)-start 05/15/16 3) Your children need to have colonoscopy at age 54 Next Steps: 1) Schedule chemo class and baseline labs 2)  Schedule CT chest-radiology will call 3)  Interventional radiology for port placement    Questions? Merceda Elks, RN, BSN at [phone]. Manuela Schwartz is your Oncology Nurse Navigator and is available to assist you while you're receiving your medical care at [facility].

## 2016-05-08 NOTE — Progress Notes (Signed)
Oncology Nurse Navigator Documentation  Oncology Nurse Navigator Flowsheets 05/08/2016  Navigator Location [facility]  Referral date to RadOnc/MedOnc 04/12/2016  Navigator Encounter Type Initial MedOnc  Abnormal Finding Date 04/09/2016  Confirmed Diagnosis Date 04/11/2016  Surgery Date 04/11/2016  Patient Visit Type MedOnc;Initial  Treatment Phase Pre-Tx/Tx Discussion  Barriers/Navigation Needs Education;Coordination of Care  Education Understanding Cancer/ Treatment Options;Newly Diagnosed Cancer Education;Preparing for Upcoming  Treatment;Concerns with Insurance Coverage  Interventions Education;Coordination of Care  Coordination of Care Radiology--left VM for IR requesting port prior to 1/11 tx start date and message to managed care for PA on CT chest  Education Method Verbal;Written;Teach-back  Support Groups/Services GI Support Group;[place]  Acuity Level 2  Time Spent with Patient 48  Met with patient and wife, [name] during new patient visit. Explained the role of the GI Nurse Navigator and provided New Patient Packet with information on: 1. Colon cancer--information provided on port, CEA test and anatomy of colon 2. Support groups 3. Advanced Directives 4. Fall Safety Plan Answered questions, reviewed current treatment plan using TEACH back and provided emotional support. Provided copy of current treatment plan. Explained that managed care will obtain authorization for his chemo and scan. Most likely with his Medicare and supplement he will be clear for the rest of the year after meeting his out of pocket deductible, which he may do with the first treatment. Patient requests and his daughter, who is a Marine scientist at [employer] strongly request port via IR instead of surgery. He has a colostomy and is semi-independent in care. HHRN has two more visits. He has already lined up his ostomy supply company. Mr. Middlesworth is active and  eating well at this time and plans to work during his treatment if possible. 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   Merceda Elks, RN, BSN GI Oncology [place]

## 2016-05-08 NOTE — Patient Instructions (Signed)
Care Plan Summary- 05/08/2016 Name: Louis [name]       DOB:  May 22, 1945 Your Medical Team:  Medical Oncologist:  Dr. Ma Rings Radiation Oncologist:   Surgeon:   Dr. Judeth Horn Type of Cancer: Adenocarcinoma of Colon  Stage/Grade: Stage IV *Exact staging of your cancer is based on size of the tumor, depth of invasion, involvement of lymph nodes or not, and whether or not the cancer has spread beyond the primary site   Recommendations: Based on information available as of today's consult. Recommendations may change depending on the results of further tests or exams. 1) Complete staging with CT chest 2) FOLFOX (Oxaliplatin, Leucovorin, 5-Flourouacil) every 2 weeks x 12 cycles (6 months)-start 05/15/16 3) Your children need to have colonoscopy at age 75 Next Steps: 1) Schedule chemo class and baseline labs 2)  Schedule CT chest-radiology will call 3)  Interventional radiology for port placement    Questions? Louis Elks, RN, BSN at [phone]. Louis [name] is your Oncology Nurse Navigator and is available to assist you while you're receiving your medical care at [facility].

## 2016-05-08 NOTE — Telephone Encounter (Signed)
Per patient request, Chemo start date of 05/21/16, [place] per Dr Benay Spice and Merceda Elks. Patient also rescheduled all appointments, per Chemo Ed, Ct, Labs and treatment dates! Patient is aware of appointments. 05/08/16

## 2016-05-08 NOTE — Progress Notes (Signed)
[employer] Patient Consult   Referring MD: Colon cancer   RAMESES OU 71 y.o.  10-Dec-1945    Reason for Referral: Judeth Horn   HPI: [name] he reports developing nausea/vomiting and diarrhea for 2-3 days in November. The symptoms recurred one week later and he presented to the [facility] emergency room. A CT of the abdomen and pelvis 04/02/2016 revealed dilatation of the small bowel beginning at the mid jejunum. The colon is distended. No adenopathy. No ascites. There is a right inguinal hernia. A Gastrografin enema on 04/08/2016 revealed an obstructing lesion at the junction of the descending and sigmoid colon. He was taken for colonoscopy by Dr. Fuller Plan on 04/09/2016. A fungating completely obstructing mass was found in the descending colon. The scope could not be advanced pass the mass. The mass was biopsied and tattooed. The pathology revealed adenocarcinoma.  He was taken to the operating room by Dr. Hulen Skains on 04/10/2016 for a sigmoid colectomy with colostomy. A 2 cm white implant was noted on the mesentery and was removed. There was no additional evidence of metastatic disease. A descending colostomy was created. The pathology ([id number]) revealed metastatic adenocarcinoma involving the mesenteric implant. The colon tumor returned as a well-differentiated adenocarcinoma involving the pericolonic tissue with focal involvement of the serosa.. Perineural invasion was identified. Metastatic carcinoma was identified in 2 of 16 lymph nodes. Multiple soft tissue nodules. The resection margins were negative. The tumor returned MSI-stable with no loss of mismatch repair protein expression. He has recovered from surgery. He saw Dr. Reynaldo Minium at [facility] yesterday for a second opinion. He reports Dr. Reynaldo Minium recommends adjuvant FOLFOX chemotherapy.  Past Medical History:  Diagnosis Date  . CKD (chronic kidney disease) stage 2, GFR 60-89 ml/min   . Hernia, Right inguinal    .  Hypertension     Past Surgical History:  Procedure Laterality Date  . BIOPSY  04/10/2016   Procedure: BIOPSY Mesentary;  Surgeon: Judeth Horn, MD;  Location: [place];  Service: General;;  . FLEXIBLE SIGMOIDOSCOPY N/A 04/09/2016   Procedure: FLEXIBLE SIGMOIDOSCOPY;  Surgeon: Ladene Artist, MD;  Location: [facility] ENDOSCOPY;  Service: Endoscopy;  Laterality: N/A;  . HERNIA REPAIR     in childhood  . PARTIAL COLECTOMY N/A 04/10/2016   Procedure: SIGMOID  COLECTOMY WITH COLOSTOMY;  Surgeon: Judeth Horn, MD;  Location: [place];  Service: General;  Laterality: N/A;  . TONSILLECTOMY      Medications: Reviewed  Allergies: No Known Allergies  Family history: No family history of cancer.  Social History:   He lives in [place]. He works in Press [employer]. He has a few glasses of wine per week. He does not smoke cigarettes. No transfusion history. No risk factor for HIV or hepatitis.     ROS:   Positives include: 30 pound weight loss, nausea/vomiting/diarrhea prior to hospital admission November 2017  A complete ROS was otherwise negative.  Physical Exam:  Blood pressure (!) 154/75, pulse 76, temperature 98.3 F (36.8 C), temperature source Oral, resp. rate 18, height _0  (1.956 m), weight 222 lb 3.2 oz (100.8 kg), SpO2 100 %.  HEENT: Oropharynx without visible mass, neck without mass Lungs: Clear bilaterally Cardiac: Regular rate and rhythm Abdomen: No hepatosplenomegaly, no mass, nontender, healed surgical incision. Hernia extends into the right inguinal region and scrotum GU: Testes without mass  Vascular: No leg edema Lymph nodes: No cervical, supraclavicular, axillary, or inguinal nodes Neurologic: Alert and oriented, the motor exam appears intact in the upper  and lower extremities Skin: No rash Musculoskeletal: No spine tenderness   LAB:  CBC  Lab Results  Component Value Date   WBC 9.8 04/12/2016   HGB 10.2 (L) 04/12/2016   HCT 30.9 (L) 04/12/2016   MCV 84.9 04/12/2016   PLT  159 04/12/2016   NEUTROABS 7.9 (H) 04/12/2016     CMP      Component Value Date/Time   NA 138 04/15/2016 0612   K 3.3 (L) 04/15/2016 0612   CL 106 04/15/2016 0612   CO2 20 (L) 04/15/2016 0612   GLUCOSE 110 (H) 04/15/2016 0612   BUN 12 04/15/2016 0612   CREATININE 2.15 (H) 04/15/2016 0612   CALCIUM 8.5 (L) 04/15/2016 0612   PROT 7.2 04/02/2016 0719   ALBUMIN 2.7 (L) 04/06/2016 1542   AST 13 (L) 04/02/2016 0719   ALT 13 (L) 04/02/2016 0719   ALKPHOS 66 04/02/2016 0719   BILITOT 0.6 04/02/2016 0719   GFRNONAA 30 (L) 04/15/2016 0612   GFRAA 34 (L) 04/15/2016 0612   04/13/2016-CEA    5.0   Imaging:  As per history of present illness, CT abdomen/pelvis 04/02/2016-images reviewed   Assessment/Plan:   1. 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   Chronic renal insufficiency  3.   Hypertension   Disposition:   Mr. Vezina has been diagnosed with stage IV colon cancer. He underwent resection of an isolated mesenteric metastasis at the time of the sigmoid colectomy. He has a high risk of developing recurrent colon cancer over the next several years. I reviewed the details of the surgical pathology report with Mr. Bourbon and his family. I explained the increased cure rate associated with 5-fluorouracil-based therapy in patients with resected stage III colon cancer. I also described the benefit associated with the addition of oxaliplatin. I think it is reasonable treat him with adjuvant therapy for potential "cure ".  I recommend FOLFOX chemotherapy. We reviewed the potential toxicities associated with the FOLFOX regimen including the chance for nausea/vomiting, mucositis, diarrhea, and hematologic  toxicity. We discussed the rash, sun sensitivity, hyperpigmentation, and hand/foot syndrome associated with 5 fluorouracil. We reviewed the allergic reaction and various types of neuropathy seen with oxaliplatin. He will attend a chemotherapy teaching class.  Mr. Seider would like to begin chemotherapy on 05/21/2016. He will be referred for placement of a Port-A-Cath-the patient and his family requested a referral to interventional radiology for this procedure.  He does not appear to have hereditary non-polyposis colon cancer syndrome, but his family members are at increased risk of developing colon cancer and should receive appropriate screening.  Approximately 60 minutes were spent with the patient today. The majority of the time was used for counseling and coordination of care. Chemotherapy orders were entered.  Betsy Coder, MD  05/08/2016, 9:26 AM

## 2016-05-09 ENCOUNTER — Other Ambulatory Visit: Payer: Self-pay | Admitting: Oncology

## 2016-05-09 ENCOUNTER — Inpatient Hospital Stay: Payer: Medicare Other | Admitting: Hematology

## 2016-05-09 DIAGNOSIS — Z483 Aftercare following surgery for neoplasm: Secondary | ICD-10-CM | POA: Diagnosis not present

## 2016-05-09 DIAGNOSIS — C189 Malignant neoplasm of colon, unspecified: Secondary | ICD-10-CM | POA: Diagnosis not present

## 2016-05-09 DIAGNOSIS — K529 Noninfective gastroenteritis and colitis, unspecified: Secondary | ICD-10-CM | POA: Diagnosis not present

## 2016-05-09 DIAGNOSIS — N182 Chronic kidney disease, stage 2 (mild): Secondary | ICD-10-CM | POA: Diagnosis not present

## 2016-05-09 DIAGNOSIS — I129 Hypertensive chronic kidney disease with stage 1 through stage 4 chronic kidney disease, or unspecified chronic kidney disease: Secondary | ICD-10-CM | POA: Diagnosis not present

## 2016-05-09 DIAGNOSIS — Z433 Encounter for attention to colostomy: Secondary | ICD-10-CM | POA: Diagnosis not present

## 2016-05-09 NOTE — Progress Notes (Signed)
ALERT: Recent Pathways Treatment decision is outdated. Please await next Pathways decision 

## 2016-05-09 NOTE — Telephone Encounter (Signed)
At request of the GI navigator on the patient's behalf furher adjustments were made to schedule at the direction of navigator. Adjustments completed by [place].  I left a message for patient today confirming next two appointments for 1/15 and 1/17 - patient to get new schedule 1/15.

## 2016-05-09 NOTE — Progress Notes (Signed)
START OFF PATHWAY REGIMEN - Colorectal  Off Pathway: FOLFOX (q14d)  [id number]:FOLFOX (q14d):   A cycle is every 14 days:     Oxaliplatin (Eloxatin(R)) 85 mg/m2 in 250 mL D5W IV over 2 hours day 1 Dose Mod: None     Leucovorin 400 mg/m2 in 250 mL D5W IV over 2 hours day 1 followed immediately by Dose Mod: None     5-Fluorouracil 400 mg/m2 IV bolus over 2-4 minutes day 1 Dose Mod: None     5-Fluorouracil 2,400 mg/m2 in _____mL NS IV as a 46 hour infusion day 1 Dose Mod: None Additional Orders: Schedule next Chemotherapy  **Always confirm dose/schedule in your pharmacy ordering system**    Patient Characteristics: AJCC T Stage: 4a AJCC N Stage: 1 AJCC Stage Grouping: IVA AJCC M Stage: 1a Current evidence of distant metastases? No  Intent of Therapy: Curative Intent, Discussed with Patient

## 2016-05-12 ENCOUNTER — Other Ambulatory Visit: Payer: Self-pay | Admitting: Radiology

## 2016-05-12 ENCOUNTER — Ambulatory Visit (HOSPITAL_COMMUNITY): Payer: Medicare Other

## 2016-05-12 ENCOUNTER — Other Ambulatory Visit: Payer: Self-pay | Admitting: *Deleted

## 2016-05-12 ENCOUNTER — Other Ambulatory Visit (HOSPITAL_COMMUNITY): Payer: Medicare Other

## 2016-05-12 MED ORDER — LIDOCAINE-PRILOCAINE 2.5-2.5 % EX CREA: 1.0000 "application " | TOPICAL_CREAM | CUTANEOUS | 0 refills | Status: DC | PRN

## 2016-05-12 MED ORDER — PROCHLORPERAZINE MALEATE 10 MG PO TABS: 10.0000 mg | ORAL_TABLET | Freq: Four times a day (QID) | ORAL | 0 refills | Status: DC | PRN

## 2016-05-13 ENCOUNTER — Other Ambulatory Visit: Payer: Medicare Other

## 2016-05-13 ENCOUNTER — Other Ambulatory Visit: Payer: Self-pay | Admitting: General Surgery

## 2016-05-13 ENCOUNTER — Ambulatory Visit (HOSPITAL_COMMUNITY): Payer: Medicare Other

## 2016-05-13 DIAGNOSIS — K529 Noninfective gastroenteritis and colitis, unspecified: Secondary | ICD-10-CM | POA: Diagnosis not present

## 2016-05-13 DIAGNOSIS — Z483 Aftercare following surgery for neoplasm: Secondary | ICD-10-CM | POA: Diagnosis not present

## 2016-05-13 DIAGNOSIS — Z433 Encounter for attention to colostomy: Secondary | ICD-10-CM | POA: Diagnosis not present

## 2016-05-13 DIAGNOSIS — C189 Malignant neoplasm of colon, unspecified: Secondary | ICD-10-CM | POA: Diagnosis not present

## 2016-05-13 DIAGNOSIS — I129 Hypertensive chronic kidney disease with stage 1 through stage 4 chronic kidney disease, or unspecified chronic kidney disease: Secondary | ICD-10-CM | POA: Diagnosis not present

## 2016-05-13 DIAGNOSIS — N182 Chronic kidney disease, stage 2 (mild): Secondary | ICD-10-CM | POA: Diagnosis not present

## 2016-05-14 ENCOUNTER — Ambulatory Visit (HOSPITAL_COMMUNITY)
Admission: RE | Admit: 2016-05-14 | Discharge: 2016-05-14 | Disposition: A | Discharge status: Home / Self Care | Payer: Medicare Other | Source: Ambulatory Visit | Attending: Oncology | Admitting: Oncology

## 2016-05-14 ENCOUNTER — Ambulatory Visit: Payer: Medicare Other

## 2016-05-14 ENCOUNTER — Encounter (HOSPITAL_COMMUNITY): Payer: Self-pay

## 2016-05-14 ENCOUNTER — Encounter: Payer: Self-pay | Admitting: *Deleted

## 2016-05-14 ENCOUNTER — Other Ambulatory Visit: Payer: Self-pay | Admitting: Oncology

## 2016-05-14 DIAGNOSIS — C187 Malignant neoplasm of sigmoid colon: Secondary | ICD-10-CM | POA: Insufficient documentation

## 2016-05-14 DIAGNOSIS — N182 Chronic kidney disease, stage 2 (mild): Secondary | ICD-10-CM | POA: Diagnosis not present

## 2016-05-14 DIAGNOSIS — Z933 Colostomy status: Secondary | ICD-10-CM | POA: Insufficient documentation

## 2016-05-14 DIAGNOSIS — I129 Hypertensive chronic kidney disease with stage 1 through stage 4 chronic kidney disease, or unspecified chronic kidney disease: Secondary | ICD-10-CM | POA: Diagnosis not present

## 2016-05-14 DIAGNOSIS — Z8719 Personal history of other diseases of the digestive system: Secondary | ICD-10-CM | POA: Insufficient documentation

## 2016-05-14 DIAGNOSIS — C189 Malignant neoplasm of colon, unspecified: Secondary | ICD-10-CM

## 2016-05-14 DIAGNOSIS — Z452 Encounter for adjustment and management of vascular access device: Secondary | ICD-10-CM | POA: Diagnosis not present

## 2016-05-14 DIAGNOSIS — Z85038 Personal history of other malignant neoplasm of large intestine: Secondary | ICD-10-CM | POA: Diagnosis not present

## 2016-05-14 HISTORY — DX: Unspecified intestinal obstruction, unspecified as to partial versus complete obstruction: K56.609

## 2016-05-14 HISTORY — DX: Ileus, unspecified: K56.7

## 2016-05-14 HISTORY — PX: IR GENERIC HISTORICAL: IMG1180011

## 2016-05-14 LAB — BASIC METABOLIC PANEL
Anion gap: 8 (ref 5–15)
BUN: 26 mg/dL — AB (ref 6–20)
CHLORIDE: 108 mmol/L (ref 101–111)
CO2: 23 mmol/L (ref 22–32)
Calcium: 9.1 mg/dL (ref 8.9–10.3)
Creatinine, Ser: 2.34 mg/dL — ABNORMAL HIGH (ref 0.61–1.24)
GFR calc Af Amer: 31 mL/min — ABNORMAL LOW (ref >60)
GFR calc non Af Amer: 27 mL/min — ABNORMAL LOW (ref >60)
GLUCOSE: 115 mg/dL — AB (ref 65–99)
POTASSIUM: 4.1 mmol/L (ref 3.5–5.1)
Sodium: 139 mmol/L (ref 135–145)

## 2016-05-14 LAB — PROTIME-INR
INR: 0.94
Prothrombin Time: 12.6 seconds (ref 11.4–15.2)

## 2016-05-14 LAB — CBC WITH DIFFERENTIAL/PLATELET
Basophils Absolute: 0 10*3/uL (ref 0.0–0.1)
Basophils Relative: 0 %
EOS PCT: 5 %
Eosinophils Absolute: 0.3 10*3/uL (ref 0.0–0.7)
HCT: 36.1 % — ABNORMAL LOW (ref 39.0–52.0)
Hemoglobin: 11.9 g/dL — ABNORMAL LOW (ref 13.0–17.0)
LYMPHS ABS: 1.9 10*3/uL (ref 0.7–4.0)
LYMPHS PCT: 28 %
MCH: 28 pg (ref 26.0–34.0)
MCHC: 33 g/dL (ref 30.0–36.0)
MCV: 84.9 fL (ref 78.0–100.0)
Monocytes Absolute: 0.7 10*3/uL (ref 0.1–1.0)
Monocytes Relative: 10 %
Neutro Abs: 3.8 10*3/uL (ref 1.7–7.7)
Neutrophils Relative %: 57 %
PLATELETS: 194 10*3/uL (ref 150–400)
RBC: 4.25 MIL/uL (ref 4.22–5.81)
RDW: 17.1 % — ABNORMAL HIGH (ref 11.5–15.5)
WBC: 6.7 10*3/uL (ref 4.0–10.5)

## 2016-05-14 MED ORDER — LIDOCAINE-EPINEPHRINE (PF) 2 %-1:200000 IJ SOLN
INTRAMUSCULAR | Status: AC
  Filled 2016-05-14: qty 20

## 2016-05-14 MED ORDER — SODIUM CHLORIDE 0.9 % IV SOLN
INTRAVENOUS | Status: DC
  Administered 2016-05-14: 12:00:00 via INTRAVENOUS

## 2016-05-14 MED ORDER — HEPARIN SOD (PORK) LOCK FLUSH 100 UNIT/ML IV SOLN
INTRAVENOUS | Status: AC | PRN
  Administered 2016-05-14: 500 [IU]

## 2016-05-14 MED ORDER — FENTANYL CITRATE (PF) 100 MCG/2ML IJ SOLN
INTRAMUSCULAR | Status: AC | PRN
  Administered 2016-05-14: 50 ug via INTRAVENOUS
  Administered 2016-05-14 (×2): 25 ug via INTRAVENOUS

## 2016-05-14 MED ORDER — MIDAZOLAM HCL 2 MG/2ML IJ SOLN
INTRAMUSCULAR | Status: AC | PRN
  Administered 2016-05-14: 0.5 mg via INTRAVENOUS
  Administered 2016-05-14: 1 mg via INTRAVENOUS
  Administered 2016-05-14: 0.5 mg via INTRAVENOUS
  Administered 2016-05-14: 2 mg via INTRAVENOUS

## 2016-05-14 MED ORDER — CEFAZOLIN SODIUM-DEXTROSE 2-4 GM/100ML-% IV SOLN
2.0000 g | INTRAVENOUS | Status: AC
  Administered 2016-05-14: 2 g via INTRAVENOUS
  Filled 2016-05-14: qty 100

## 2016-05-14 MED ORDER — HEPARIN SOD (PORK) LOCK FLUSH 100 UNIT/ML IV SOLN
INTRAVENOUS | Status: AC
  Filled 2016-05-14: qty 5

## 2016-05-14 MED ORDER — LIDOCAINE-EPINEPHRINE (PF) 2 %-1:200000 IJ SOLN
INTRAMUSCULAR | Status: AC | PRN
  Administered 2016-05-14: 20 mL

## 2016-05-14 MED ORDER — FENTANYL CITRATE (PF) 100 MCG/2ML IJ SOLN
INTRAMUSCULAR | Status: AC
  Filled 2016-05-14: qty 4

## 2016-05-14 MED ORDER — MIDAZOLAM HCL 2 MG/2ML IJ SOLN
INTRAMUSCULAR | Status: AC
  Filled 2016-05-14: qty 6

## 2016-05-14 NOTE — Consult Note (Signed)
Chief Complaint: Patient was seen in consultation today for Port-A-Cath placement  Referring Physician(s): Sherrill,Gary B  Supervising Physician: Corrie Mckusick  Patient Status: [facility] - Out-pt  History of Present Illness: Louis [name] is a 71 y.o. male with history of recently diagnosed Stage IV colon cancer status post sigmoid colectomy and colostomy on 04/10/16. He presents today for Port-A-Cath placement for planned chemotherapy.  Past Medical History:  Diagnosis Date  . CKD (chronic kidney disease) stage 2, GFR 60-89 ml/min   . Hernia, abdominal   . Hypertension   . Ileus ([place])   . Small bowel obstruction     Past Surgical History:  Procedure Laterality Date  . BIOPSY  04/10/2016   Procedure: BIOPSY Mesentary;  Surgeon: Judeth Horn, MD;  Location: [place];  Service: General;;  . FLEXIBLE SIGMOIDOSCOPY N/A 04/09/2016   Procedure: FLEXIBLE SIGMOIDOSCOPY;  Surgeon: Ladene Artist, MD;  Location: [facility] ENDOSCOPY;  Service: Endoscopy;  Laterality: N/A;  . HERNIA REPAIR     in childhood  . PARTIAL COLECTOMY N/A 04/10/2016   Procedure: SIGMOID  COLECTOMY WITH COLOSTOMY;  Surgeon: Judeth Horn, MD;  Location: [place];  Service: General;  Laterality: N/A;  . TONSILLECTOMY      Allergies: Patient has no known allergies.  Medications: Prior to Admission medications   Medication Sig Start Date End Date Taking? Authorizing Provider  amLODipine (NORVASC) 10 MG tablet Take 10 mg by mouth daily. 02/11/16  Yes Historical Provider, MD  labetalol (NORMODYNE) 100 MG tablet Take 100 mg by mouth 2 (two) times daily. 04/23/16  Yes Historical Provider, MD  loperamide (IMODIUM) 2 MG capsule Take 2-4 mg by mouth daily as needed. 03/07/16  Yes Historical Provider, MD  Multiple Vitamin (MULTI-VITAMINS) TABS Take 1 tablet by mouth daily.   Yes Historical Provider, MD  prochlorperazine (COMPAZINE) 10 MG tablet Take 1 tablet (10 mg total) by mouth every 6 (six) hours as needed for nausea or vomiting.  05/12/16  Yes Ladell Pier, MD  promethazine (PHENERGAN) 25 MG tablet Take 25 mg by mouth every 6 (six) hours as needed for nausea or vomiting.  03/31/16  Yes Historical Provider, MD  lidocaine-prilocaine (EMLA) cream Apply 1 application topically as needed. Apply 1 hr prior to port access and cover with plastic wrap 05/12/16   Ladell Pier, MD  omega-3 acid ethyl esters (LOVAZA) 1 g capsule Take 1 capsule by mouth daily. 03/22/16   Historical Provider, MD     History reviewed. No pertinent family history.  Social History   Social History  . Marital status: Married    Spouse name: N/A  . Number of children: N/A  . Years of education: N/A   Social History Main Topics  . Smoking status: Never Smoker  . Smokeless tobacco: Never Used  . Alcohol use Yes     Comment: occ  . Drug use: No  . Sexual activity: Not Asked   Other Topics Concern  . 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  Vital Signs: Ht 6\' 4"  (1.93 m)   Wt 220 lb (99.8 kg)   BMI 26.78 kg/m  Blood pressure 155/91, temperature 97.4, heart rate 71,respirations 20, O2 sats 100% room air  Physical Exam  Awake, alert. Chest clear to auscultation bilaterally. Heart with regular rate and rhythm. Abdomen soft, positive bowel sounds, intact left lower quadrant colostomy; lower extremities with no edema  Mallampati Score:     Imaging: No results found.  Labs:  CBC:  Recent Labs  04/10/16 0459 04/11/16 0343 04/12/16 0239 05/14/16 1155  WBC 8.1 13.0* 9.8 6.7  HGB 11.0* 11.2* 10.2* 11.9*  HCT 33.1* 34.4* 30.9* 36.1*  PLT 162 180 159 194    COAGS:  Recent Labs  05/14/16 1155  INR 0.94    BMP:  Recent Labs  04/11/16 0343 04/12/16 0239 04/14/16 0613  04/15/16 0612  NA 141 136 138 138  K 4.5 3.8 2.9* 3.3*  CL 105 101 101 106  CO2 25 26 26  20*  GLUCOSE 168* 169* 124* 110*  BUN 25* 25* 14 12  CALCIUM 8.1* 7.8* 8.4* 8.5*  CREATININE 2.74* 2.80* 2.22* 2.15*  GFRNONAA 22* 22* 28* 30*  GFRAA 26* 25* 33* 34*    LIVER FUNCTION TESTS:  Recent Labs  04/02/16 0719 04/05/16 1521 04/06/16 1542  BILITOT 0.6  --   --   AST 13*  --   --   ALT 13*  --   --   ALKPHOS 66  --   --   PROT 7.2  --   --   ALBUMIN 3.4* 2.8* 2.7*    TUMOR MARKERS:  Recent Labs  04/13/16 0417  CEA 5.0*    Assessment and Plan: 71 y.o. male with history of recently diagnosed Stage IV colon cancer status post sigmoid colectomy and colostomy on 04/10/16. He presents today for Port-A-Cath placement for planned chemotherapy.Risks and benefits discussed with the patient/wife including, but not limited to bleeding, infection, pneumothorax, or fibrin sheath development and need for additional procedures.All of the patient's questions were answered, patient is agreeable to proceed.Consent signed and in chart.     Thank you for this interesting consult.  I greatly enjoyed meeting Louis [name] and look forward to participating in their care.  A copy of this report was sent to the requesting provider on this date.  Electronically Signed: D. Rowe Robert 05/14/2016, 12:38 PM   I spent a total of 20 minutes    in face to face in clinical consultation, greater than 50% of which was counseling/coordinating care for Port-A-Cath placement

## 2016-05-14 NOTE — Procedures (Signed)
Interventional Radiology Procedure Note  Procedure: Placement of a right IJ approach single lumen PowerPort.  Tip is positioned at the superior cavoatrial junction and catheter is ready for immediate use.  Complications: None Recommendations:  - Ok to shower tomorrow - Do not submerge for 7 days - Routine line care   Signed,  Erling Arrazola S. Stephanye Finnicum, DO   

## 2016-05-14 NOTE — Discharge Instructions (Signed)
Implanted Port Home Guide °An implanted port is a type of central line that is placed under the skin. Central lines are used to provide IV access when treatment or nutrition needs to be given through a person's veins. Implanted ports are used for long-term IV access. An implanted port may be placed because:  °· You need IV medicine that would be irritating to the small veins in your hands or arms.   °· You need long-term IV medicines, such as antibiotics.   °· You need IV nutrition for a long period.   °· You need frequent blood draws for lab tests.   °· You need dialysis.   °Implanted ports are usually placed in the chest area, but they can also be placed in the upper arm, the abdomen, or the leg. An implanted port has two main parts:  °· Reservoir. The reservoir is round and will appear as a small, raised area under your skin. The reservoir is the part where a needle is inserted to give medicines or draw blood.   °· Catheter. The catheter is a thin, flexible tube that extends from the reservoir. The catheter is placed into a large vein. Medicine that is inserted into the reservoir goes into the catheter and then into the vein.   °HOW WILL I CARE FOR MY INCISION SITE? °Do not get the incision site wet. Bathe or shower as directed by your health care provider.  °HOW IS MY PORT ACCESSED? °Special steps must be taken to access the port:  °· Before the port is accessed, a numbing cream can be placed on the skin. This helps numb the skin over the port site.   °· Your health care provider uses a sterile technique to access the port. °¨ Your health care provider must put on a mask and sterile gloves. °¨ The skin over your port is cleaned carefully with an antiseptic and allowed to dry. °¨ The port is gently pinched between sterile gloves, and a needle is inserted into the port. °· Only "non-coring" port needles should be used to access the port. Once the port is accessed, a blood return should be checked. This helps  ensure that the port is in the vein and is not clogged.   °· If your port needs to remain accessed for a constant infusion, a clear (transparent) bandage will be placed over the needle site. The bandage and needle will need to be changed every week, or as directed by your health care provider.   °· Keep the bandage covering the needle clean and dry. Do not get it wet. Follow your health care provider's instructions on how to take a shower or bath while the port is accessed.   °· If your port does not need to stay accessed, no bandage is needed over the port.   °WHAT IS FLUSHING? °Flushing helps keep the port from getting clogged. Follow your health care provider's instructions on how and when to flush the port. Ports are usually flushed with saline solution or a medicine called heparin. The need for flushing will depend on how the port is used.  °· If the port is used for intermittent medicines or blood draws, the port will need to be flushed:   °¨ After medicines have been given.   °¨ After blood has been drawn.   °¨ As part of routine maintenance.   °· If a constant infusion is running, the port may not need to be flushed.   °HOW LONG WILL MY PORT STAY IMPLANTED? °The port can stay in for as long as your health care   provider thinks it is needed. When it is time for the port to come out, surgery will be done to remove it. The procedure is similar to the one performed when the port was put in.  °WHEN SHOULD I SEEK IMMEDIATE MEDICAL CARE? °When you have an implanted port, you should seek immediate medical care if:  °· You notice a bad smell coming from the incision site.   °· You have swelling, redness, or drainage at the incision site.   °· You have more swelling or pain at the port site or the surrounding area.   °· You have a fever that is not controlled with medicine. °This information is not intended to replace advice given to you by your health care provider. Make sure you discuss any questions you have with  your health care provider. °Document Released: 04/21/2005 Document Revised: 02/09/2013 Document Reviewed: 12/27/2012 °Elsevier Interactive Patient Education © 2017 [employer] Inc[employer] °Implanted Port Insertion, Care After °Refer to this sheet in the next few weeks. These instructions provide you with information on caring for yourself after your procedure. Your health care provider may also give you more specific instructions. Your treatment has been planned according to current medical practices, but problems sometimes occur. Call your health care provider if you have any problems or questions after your procedure. °WHAT TO EXPECT AFTER THE PROCEDURE °After your procedure, it is typical to have the following:  °· Discomfort at the port insertion site. Ice packs to the area will help. °· Bruising on the skin over the port. This will subside in 3-4 days. °HOME CARE INSTRUCTIONS °· After your port is placed, you will get a manufacturer's information card. The card has information about your port. Keep this card with you at all times.   °· Know what kind of port you have. There are many types of ports available.   °· Wear a medical alert bracelet in case of an emergency. This can help alert health care workers that you have a port.   °· The port can stay in for as long as your health care provider believes it is necessary.   °· A home health care nurse may give medicines and take care of the port.   °· You or a family member can get special training and directions for giving medicine and taking care of the port at home.   °SEEK MEDICAL CARE IF:  °· Your port does not flush or you are unable to get a blood return.   °· You have a fever or chills. °SEEK IMMEDIATE MEDICAL CARE IF: °· You have new fluid or pus coming from your incision.   °· You notice a bad smell coming from your incision site.   °· You have swelling, pain, or more redness at the incision or port site.   °· You have chest pain or shortness of breath. °This  information is not intended to replace advice given to you by your health care provider. Make sure you discuss any questions you have with your health care provider. °Document Released: 02/09/2013 Document Revised: 04/26/2013 Document Reviewed: 02/09/2013 °Elsevier Interactive Patient Education © 2017 [employer] Inc[employer] °Moderate Conscious Sedation, Adult, Care After °These instructions provide you with information about caring for yourself after your procedure. Your health care provider may also give you more specific instructions. Your treatment has been planned according to current medical practices, but problems sometimes occur. Call your health care provider if you have any problems or questions after your procedure. °What can I expect after the procedure? °After your procedure, it is common: °·   To feel sleepy for several hours. °· To feel clumsy and have poor balance for several hours. °· To have poor judgment for several hours. °· To vomit if you eat too soon. °Follow these instructions at home: °For at least 24 hours after the procedure:  °· Do not: °¨ Participate in activities where you could fall or become injured. °¨ Drive. °¨ Use heavy machinery. °¨ Drink alcohol. °¨ Take sleeping pills or medicines that cause drowsiness. °¨ Make important decisions or sign legal documents. °¨ Take care of children on your own. °· Rest. °Eating and drinking °· Follow the diet recommended by your health care provider. °· If you vomit: °¨ Drink water, juice, or soup when you can drink without vomiting. °¨ Make sure you have little or no nausea before eating solid foods. °General instructions °· Have a responsible adult stay with you until you are awake and alert. °· Take over-the-counter and prescription medicines only as told by your health care provider. °· If you smoke, do not smoke without supervision. °· Keep all follow-up visits as told by your health care provider. This is important. °Contact a health care provider  if: °· You keep feeling nauseous or you keep vomiting. °· You feel light-headed. °· You develop a rash. °· You have a fever. °Get help right away if: °· You have trouble breathing. °This information is not intended to replace advice given to you by your health care provider. Make sure you discuss any questions you have with your health care provider. °Document Released: 02/09/2013 Document Revised: 09/24/2015 Document Reviewed: 08/11/2015 °Elsevier Interactive Patient Education © 2017 [employer] Inc. ° °

## 2016-05-14 NOTE — Progress Notes (Signed)
Oncology Nurse Navigator Documentation  Oncology Nurse Navigator Flowsheets 05/14/2016  Navigator Location [facility]  Referral date to RadOnc/MedOnc -  Navigator Encounter Type Letter/Fax/Email  Telephone -  Abnormal Finding Date -  Confirmed Diagnosis Date -  Surgery Date -  Patient Visit Type -  Treatment Phase -  Barriers/Navigation Needs Coordination of Care  Education -  Interventions Referrals--[facility] department  Referrals Other---in basket referral to [facility], RN in research to see patient on his 06/04/16 10:45 visit regarding NSABP MPR-1 registry per Dr. Benay Spice request.  Coordination of [place] with Patient -

## 2016-05-18 ENCOUNTER — Other Ambulatory Visit: Payer: Self-pay | Admitting: Oncology

## 2016-05-19 ENCOUNTER — Other Ambulatory Visit: Payer: Medicare Other

## 2016-05-19 ENCOUNTER — Other Ambulatory Visit (HOSPITAL_BASED_OUTPATIENT_CLINIC_OR_DEPARTMENT_OTHER): Payer: Medicare Other

## 2016-05-19 ENCOUNTER — Encounter: Payer: Self-pay | Admitting: *Deleted

## 2016-05-19 ENCOUNTER — Ambulatory Visit (HOSPITAL_COMMUNITY)
Admission: RE | Admit: 2016-05-19 | Discharge: 2016-05-19 | Disposition: A | Discharge status: Home / Self Care | Payer: Medicare Other | Source: Ambulatory Visit | Attending: Oncology | Admitting: Oncology

## 2016-05-19 DIAGNOSIS — C189 Malignant neoplasm of colon, unspecified: Secondary | ICD-10-CM | POA: Diagnosis not present

## 2016-05-19 DIAGNOSIS — I700 Atherosclerosis of aorta: Secondary | ICD-10-CM | POA: Insufficient documentation

## 2016-05-19 DIAGNOSIS — I2510 Atherosclerotic heart disease of native coronary artery without angina pectoris: Secondary | ICD-10-CM | POA: Diagnosis not present

## 2016-05-19 DIAGNOSIS — C187 Malignant neoplasm of sigmoid colon: Secondary | ICD-10-CM

## 2016-05-19 LAB — COMPREHENSIVE METABOLIC PANEL
ALT: 11 U/L (ref 0–55)
ANION GAP: 9 meq/L (ref 3–11)
AST: 11 U/L (ref 5–34)
Albumin: 3.3 g/dL — ABNORMAL LOW (ref 3.5–5.0)
Alkaline Phosphatase: 78 U/L (ref 40–150)
BUN: 24.8 mg/dL (ref 7.0–26.0)
CALCIUM: 9.4 mg/dL (ref 8.4–10.4)
CHLORIDE: 108 meq/L (ref 98–109)
CO2: 24 mEq/L (ref 22–29)
CREATININE: 2.1 mg/dL — AB (ref 0.7–1.3)
EGFR: 31 mL/min/{1.73_m2} — ABNORMAL LOW (ref >90)
Glucose: 119 mg/dl (ref 70–140)
POTASSIUM: 4.1 meq/L (ref 3.5–5.1)
Sodium: 141 mEq/L (ref 136–145)
Total Bilirubin: 0.35 mg/dL (ref 0.20–1.20)
Total Protein: 7.1 g/dL (ref 6.4–8.3)

## 2016-05-19 LAB — CBC WITH DIFFERENTIAL/PLATELET
BASO%: 0.8 % (ref 0.0–2.0)
Basophils Absolute: 0 10*3/uL (ref 0.0–0.1)
EOS ABS: 0.3 10*3/uL (ref 0.0–0.5)
EOS%: 5.5 % (ref 0.0–7.0)
HCT: 36.6 % — ABNORMAL LOW (ref 38.4–49.9)
HGB: 12.1 g/dL — ABNORMAL LOW (ref 13.0–17.1)
LYMPH%: 25.8 % (ref 14.0–49.0)
MCH: 28.5 pg (ref 27.2–33.4)
MCHC: 33 g/dL (ref 32.0–36.0)
MCV: 86.5 fL (ref 79.3–98.0)
MONO#: 0.6 10*3/uL (ref 0.1–0.9)
MONO%: 10.8 % (ref 0.0–14.0)
NEUT%: 57.1 % (ref 39.0–75.0)
NEUTROS ABS: 3.3 10*3/uL (ref 1.5–6.5)
Platelets: 182 10*3/uL (ref 140–400)
RBC: 4.24 10*6/uL (ref 4.20–5.82)
RDW: 17.8 % — AB (ref 11.0–14.6)
WBC: 5.7 10*3/uL (ref 4.0–10.3)
lymph#: 1.5 10*3/uL (ref 0.9–3.3)

## 2016-05-21 ENCOUNTER — Ambulatory Visit: Payer: Medicare Other | Admitting: Nutrition

## 2016-05-21 ENCOUNTER — Ambulatory Visit (HOSPITAL_BASED_OUTPATIENT_CLINIC_OR_DEPARTMENT_OTHER): Payer: Medicare Other

## 2016-05-21 ENCOUNTER — Encounter: Payer: Self-pay | Admitting: *Deleted

## 2016-05-21 VITALS — BP 147/77 | HR 73 | Temp 98.3°F | Resp 18

## 2016-05-21 DIAGNOSIS — Z5111 Encounter for antineoplastic chemotherapy: Secondary | ICD-10-CM | POA: Diagnosis not present

## 2016-05-21 DIAGNOSIS — C187 Malignant neoplasm of sigmoid colon: Secondary | ICD-10-CM | POA: Diagnosis not present

## 2016-05-21 DIAGNOSIS — C189 Malignant neoplasm of colon, unspecified: Secondary | ICD-10-CM

## 2016-05-21 MED ORDER — LEUCOVORIN CALCIUM INJECTION 350 MG
400.0000 mg/m2 | Freq: Once | INTRAMUSCULAR | Status: AC
  Administered 2016-05-21: 936 mg via INTRAVENOUS
  Filled 2016-05-21: qty 46.8

## 2016-05-21 MED ORDER — DEXAMETHASONE SODIUM PHOSPHATE 10 MG/ML IJ SOLN
INTRAMUSCULAR | Status: AC
  Filled 2016-05-21: qty 1

## 2016-05-21 MED ORDER — SODIUM CHLORIDE 0.9% FLUSH
10.0000 mL | INTRAVENOUS | Status: DC | PRN
  Filled 2016-05-21: qty 10

## 2016-05-21 MED ORDER — FLUOROURACIL CHEMO INJECTION 2.5 GM/50ML
400.0000 mg/m2 | Freq: Once | INTRAVENOUS | Status: AC
  Administered 2016-05-21: 950 mg via INTRAVENOUS
  Filled 2016-05-21: qty 19

## 2016-05-21 MED ORDER — DEXAMETHASONE SODIUM PHOSPHATE 10 MG/ML IJ SOLN
10.0000 mg | Freq: Once | INTRAMUSCULAR | Status: AC
  Administered 2016-05-21: 10 mg via INTRAVENOUS

## 2016-05-21 MED ORDER — PALONOSETRON HCL INJECTION 0.25 MG/5ML
INTRAVENOUS | Status: AC
  Filled 2016-05-21: qty 5

## 2016-05-21 MED ORDER — SODIUM CHLORIDE 0.9 % IV SOLN
2400.0000 mg/m2 | INTRAVENOUS | Status: DC
  Administered 2016-05-21: 5600 mg via INTRAVENOUS
  Filled 2016-05-21: qty 112

## 2016-05-21 MED ORDER — DEXTROSE 5 % IV SOLN
Freq: Once | INTRAVENOUS | Status: AC
  Administered 2016-05-21: 10:00:00 via INTRAVENOUS

## 2016-05-21 MED ORDER — HEPARIN SOD (PORK) LOCK FLUSH 100 UNIT/ML IV SOLN
500.0000 [IU] | Freq: Once | INTRAVENOUS | Status: DC | PRN
  Filled 2016-05-21: qty 5

## 2016-05-21 MED ORDER — PALONOSETRON HCL INJECTION 0.25 MG/5ML
0.2500 mg | Freq: Once | INTRAVENOUS | Status: AC
  Administered 2016-05-21: 0.25 mg via INTRAVENOUS

## 2016-05-21 MED ORDER — OXALIPLATIN CHEMO INJECTION 100 MG/20ML
85.0000 mg/m2 | Freq: Once | INTRAVENOUS | Status: AC
  Administered 2016-05-21: 200 mg via INTRAVENOUS
  Filled 2016-05-21: qty 40

## 2016-05-21 NOTE — Progress Notes (Signed)
Oncology Nurse Navigator Documentation  Oncology Nurse Navigator Flowsheets 05/21/2016  Navigator Location [facility]  Referral date to RadOnc/MedOnc -  Navigator Encounter Type Treatment  Telephone -  Abnormal Finding Date -  Confirmed Diagnosis Date -  Surgery Date -  Treatment Initiated Date 05/21/2016  Patient Visit Type MedOnc  Treatment Phase First Chemo Tx--FOLFOX  Barriers/Navigation Needs No barriers at this time;No Questions;No Needs  Education -  Interventions None required-confirmed he had his anti-emetics at home.  Referrals -  Coordination of Care -  Education Method -  Support Groups/Services -  Acuity Level 1  Time Spent with Patient 15

## 2016-05-21 NOTE — Progress Notes (Signed)
Per Dr. Benay Spice, patient OK to treat with Cr of 2.1. Advised to consult with pharmacy. Pharmacy said OK to treat as well.   Wylene Simmer, BSN, RN 05/21/2016 9:57 AM

## 2016-05-21 NOTE — Patient Instructions (Signed)
[facility] Discharge Instructions for Patients Receiving Chemotherapy  Today you received the following chemotherapy agents: Oxaliplatin, Leucovorin, Fluorouracil   To help prevent nausea and vomiting after your treatment, we encourage you to take your nausea medication as prescribed.    If you develop nausea and vomiting that is not controlled by your nausea medication, call the clinic.   BELOW ARE SYMPTOMS THAT SHOULD BE REPORTED IMMEDIATELY:  *FEVER GREATER THAN 100.5 F  *CHILLS WITH OR WITHOUT FEVER  NAUSEA AND VOMITING THAT IS NOT CONTROLLED WITH YOUR NAUSEA MEDICATION  *UNUSUAL SHORTNESS OF BREATH  *UNUSUAL BRUISING OR BLEEDING  TENDERNESS IN MOUTH AND THROAT WITH OR WITHOUT PRESENCE OF ULCERS  *URINARY PROBLEMS  *BOWEL PROBLEMS  UNUSUAL RASH Items with * indicate a potential emergency and should be followed up as soon as possible.  Feel free to call the clinic you have any questions or concerns. The clinic phone number is [phone].  Please show the [place] at check-in to the Emergency Department and triage nurse.  Oxaliplatin Injection What is this medicine? OXALIPLATIN (ox AL i PLA tin) is a chemotherapy drug. It targets fast dividing cells, like cancer cells, and causes these cells to die. This medicine is used to treat cancers of the colon and rectum, and many other cancers. This medicine may be used for other purposes; ask your health care provider or pharmacist if you have questions. COMMON BRAND NAME(S): Eloxatin What should I tell my health care provider before I take this medicine? They need to know if you have any of these conditions: -kidney disease -an unusual or allergic reaction to oxaliplatin, other chemotherapy, other medicines, foods, dyes, or preservatives -pregnant or trying to get pregnant -breast-feeding How should I use this medicine? This drug is given as an infusion into a vein. It is administered in a hospital  or clinic by a specially trained health care professional. Talk to your pediatrician regarding the use of this medicine in children. Special care may be needed. Overdosage: If you think you have taken too much of this medicine contact a poison control center or emergency room at once. NOTE: This medicine is only for you. Do not share this medicine with others. What if I miss a dose? It is important not to miss a dose. Call your doctor or health care professional if you are unable to keep an appointment. What may interact with this medicine? -medicines to increase blood counts like filgrastim, pegfilgrastim, sargramostim -probenecid -some antibiotics like amikacin, gentamicin, neomycin, polymyxin B, streptomycin, tobramycin -zalcitabine Talk to your doctor or health care professional before taking any of these medicines: -acetaminophen -aspirin -ibuprofen -ketoprofen -naproxen This list may not describe all possible interactions. Give your health care provider a list of all the medicines, herbs, non-prescription drugs, or dietary supplements you use. Also tell them if you smoke, drink alcohol, or use illegal drugs. Some items may interact with your medicine. What should I watch for while using this medicine? Your condition will be monitored carefully while you are receiving this medicine. You will need important blood work done while you are taking this medicine. This medicine can make you more sensitive to cold. Do not drink cold drinks or use ice. Cover exposed skin before coming in contact with cold temperatures or cold objects. When out in cold weather wear warm clothing and cover your mouth and nose to warm the air that goes into your lungs. Tell your doctor if you get sensitive to the cold.  This drug may make you feel generally unwell. This is not uncommon, as chemotherapy can affect healthy cells as well as cancer cells. Report any side effects. Continue your course of treatment even  though you feel ill unless your doctor tells you to stop. In some cases, you may be given additional medicines to help with side effects. Follow all directions for their use. Call your doctor or health care professional for advice if you get a fever, chills or sore throat, or other symptoms of a cold or flu. Do not treat yourself. This drug decreases your body's ability to fight infections. Try to avoid being around people who are sick. This medicine may increase your risk to bruise or bleed. Call your doctor or health care professional if you notice any unusual bleeding. Be careful brushing and flossing your teeth or using a toothpick because you may get an infection or bleed more easily. If you have any dental work done, tell your dentist you are receiving this medicine. Avoid taking products that contain aspirin, acetaminophen, ibuprofen, naproxen, or ketoprofen unless instructed by your doctor. These medicines may hide a fever. Do not become pregnant while taking this medicine. Women should inform their doctor if they wish to become pregnant or think they might be pregnant. There is a potential for serious side effects to an unborn child. Talk to your health care professional or pharmacist for more information. Do not breast-feed an infant while taking this medicine. Call your doctor or health care professional if you get diarrhea. Do not treat yourself. What side effects may I notice from receiving this medicine? Side effects that you should report to your doctor or health care professional as soon as possible: -allergic reactions like skin rash, itching or hives, swelling of the face, lips, or tongue -low blood counts - This drug may decrease the number of white blood cells, red blood cells and platelets. You may be at increased risk for infections and bleeding. -signs of infection - fever or chills, cough, sore throat, pain or difficulty passing urine -signs of decreased platelets or bleeding -  bruising, pinpoint red spots on the skin, black, tarry stools, nosebleeds -signs of decreased red blood cells - unusually weak or tired, fainting spells, lightheadedness -breathing problems -chest pain, pressure -cough -diarrhea -jaw tightness -mouth sores -nausea and vomiting -pain, swelling, redness or irritation at the injection site -pain, tingling, numbness in the hands or feet -problems with balance, talking, walking -redness, blistering, peeling or loosening of the skin, including inside the mouth -trouble passing urine or change in the amount of urine Side effects that usually do not require medical attention (report to your doctor or health care professional if they continue or are bothersome): -changes in vision -constipation -hair loss -loss of appetite -metallic taste in the mouth or changes in taste -stomach pain This list may not describe all possible side effects. Call your doctor for medical advice about side effects. You may report side effects to FDA at 1-800-FDA-1088. Where should I keep my medicine? This drug is given in a hospital or clinic and will not be stored at home. NOTE: This sheet is a summary. It may not cover all possible information. If you have questions about this medicine, talk to your doctor, pharmacist, or health care provider.  2017 Elsevier/Gold Standard (2007-11-16 17:22:47) Leucovorin injection What is this medicine? LEUCOVORIN (loo koe VOR in) is used to prevent or treat the harmful effects of some medicines. This medicine is used to treat anemia  caused by a low amount of folic acid in the body. It is also used with 5-fluorouracil (5-FU) to treat colon cancer. This medicine may be used for other purposes; ask your health care provider or pharmacist if you have questions. What should I tell my health care provider before I take this medicine? They need to know if you have any of these conditions: -anemia from low levels of vitamin B-12 in the  blood -an unusual or allergic reaction to leucovorin, folic acid, other medicines, foods, dyes, or preservatives -pregnant or trying to get pregnant -breast-feeding How should I use this medicine? This medicine is for injection into a muscle or into a vein. It is given by a health care professional in a hospital or clinic setting. Talk to your pediatrician regarding the use of this medicine in children. Special care may be needed. Overdosage: If you think you have taken too much of this medicine contact a poison control center or emergency room at once. NOTE: This medicine is only for you. Do not share this medicine with others. What if I miss a dose? This does not apply. What may interact with this medicine? -capecitabine -fluorouracil -phenobarbital -phenytoin -primidone -trimethoprim-sulfamethoxazole This list may not describe all possible interactions. Give your health care provider a list of all the medicines, herbs, non-prescription drugs, or dietary supplements you use. Also tell them if you smoke, drink alcohol, or use illegal drugs. Some items may interact with your medicine. What should I watch for while using this medicine? Your condition will be monitored carefully while you are receiving this medicine. This medicine may increase the side effects of 5-fluorouracil, 5-FU. Tell your doctor or health care professional if you have diarrhea or mouth sores that do not get better or that get worse. What side effects may I notice from receiving this medicine? Side effects that you should report to your doctor or health care professional as soon as possible: -allergic reactions like skin rash, itching or hives, swelling of the face, lips, or tongue -breathing problems -fever, infection -mouth sores -unusual bleeding or bruising -unusually weak or tired Side effects that usually do not require medical attention (report to your doctor or health care professional if they continue or are  bothersome): -constipation or diarrhea -loss of appetite -nausea, vomiting This list may not describe all possible side effects. Call your doctor for medical advice about side effects. You may report side effects to FDA at 1-800-FDA-1088. Where should I keep my medicine? This drug is given in a hospital or clinic and will not be stored at home. NOTE: This sheet is a summary. It may not cover all possible information. If you have questions about this medicine, talk to your doctor, pharmacist, or health care provider.  2017 Elsevier/Gold Standard (2007-10-26 16:50:29) Fluorouracil, 5-FU injection What is this medicine? FLUOROURACIL, 5-FU (flure oh YOOR a sil) is a chemotherapy drug. It slows the growth of cancer cells. This medicine is used to treat many types of cancer like breast cancer, colon or rectal cancer, pancreatic cancer, and stomach cancer. This medicine may be used for other purposes; ask your health care provider or pharmacist if you have questions. COMMON BRAND NAME(S): Adrucil What should I tell my health care provider before I take this medicine? They need to know if you have any of these conditions: -blood disorders -dihydropyrimidine dehydrogenase (DPD) deficiency -infection (especially a virus infection such as chickenpox, cold sores, or herpes) -kidney disease -liver disease -malnourished, poor nutrition -recent or ongoing radiation  therapy -an unusual or allergic reaction to fluorouracil, other chemotherapy, other medicines, foods, dyes, or preservatives -pregnant or trying to get pregnant -breast-feeding How should I use this medicine? This drug is given as an infusion or injection into a vein. It is administered in a hospital or clinic by a specially trained health care professional. Talk to your pediatrician regarding the use of this medicine in children. Special care may be needed. Overdosage: If you think you have taken too much of this medicine contact a poison  control center or emergency room at once. NOTE: This medicine is only for you. Do not share this medicine with others. What if I miss a dose? It is important not to miss your dose. Call your doctor or health care professional if you are unable to keep an appointment. What may interact with this medicine? -allopurinol -cimetidine -dapsone -digoxin -hydroxyurea -leucovorin -levamisole -medicines for seizures like ethotoin, fosphenytoin, phenytoin -medicines to increase blood counts like filgrastim, pegfilgrastim, sargramostim -medicines that treat or prevent blood clots like warfarin, enoxaparin, and dalteparin -methotrexate -metronidazole -pyrimethamine -some other chemotherapy drugs like busulfan, cisplatin, estramustine, vinblastine -trimethoprim -trimetrexate -vaccines Talk to your doctor or health care professional before taking any of these medicines: -acetaminophen -aspirin -ibuprofen -ketoprofen -naproxen This list may not describe all possible interactions. Give your health care provider a list of all the medicines, herbs, non-prescription drugs, or dietary supplements you use. Also tell them if you smoke, drink alcohol, or use illegal drugs. Some items may interact with your medicine. What should I watch for while using this medicine? Visit your doctor for checks on your progress. This drug may make you feel generally unwell. This is not uncommon, as chemotherapy can affect healthy cells as well as cancer cells. Report any side effects. Continue your course of treatment even though you feel ill unless your doctor tells you to stop. In some cases, you may be given additional medicines to help with side effects. Follow all directions for their use. Call your doctor or health care professional for advice if you get a fever, chills or sore throat, or other symptoms of a cold or flu. Do not treat yourself. This drug decreases your body's ability to fight infections. Try to avoid  being around people who are sick. This medicine may increase your risk to bruise or bleed. Call your doctor or health care professional if you notice any unusual bleeding. Be careful brushing and flossing your teeth or using a toothpick because you may get an infection or bleed more easily. If you have any dental work done, tell your dentist you are receiving this medicine. Avoid taking products that contain aspirin, acetaminophen, ibuprofen, naproxen, or ketoprofen unless instructed by your doctor. These medicines may hide a fever. Do not become pregnant while taking this medicine. Women should inform their doctor if they wish to become pregnant or think they might be pregnant. There is a potential for serious side effects to an unborn child. Talk to your health care professional or pharmacist for more information. Do not breast-feed an infant while taking this medicine. Men should inform their doctor if they wish to father a child. This medicine may lower sperm counts. Do not treat diarrhea with over the counter products. Contact your doctor if you have diarrhea that lasts more than 2 days or if it is severe and watery. This medicine can make you more sensitive to the sun. Keep out of the sun. If you cannot avoid being in the sun, wear protective  clothing and use sunscreen. Do not use sun lamps or tanning beds/booths. What side effects may I notice from receiving this medicine? Side effects that you should report to your doctor or health care professional as soon as possible: -allergic reactions like skin rash, itching or hives, swelling of the face, lips, or tongue -low blood counts - this medicine may decrease the number of white blood cells, red blood cells and platelets. You may be at increased risk for infections and bleeding. -signs of infection - fever or chills, cough, sore throat, pain or difficulty passing urine -signs of decreased platelets or bleeding - bruising, pinpoint red spots on the  skin, black, tarry stools, blood in the urine -signs of decreased red blood cells - unusually weak or tired, fainting spells, lightheadedness -breathing problems -changes in vision -chest pain -mouth sores -nausea and vomiting -pain, swelling, redness at site where injected -pain, tingling, numbness in the hands or feet -redness, swelling, or sores on hands or feet -stomach pain -unusual bleeding Side effects that usually do not require medical attention (report to your doctor or health care professional if they continue or are bothersome): -changes in finger or toe nails -diarrhea -dry or itchy skin -hair loss -headache -loss of appetite -sensitivity of eyes to the light -stomach upset -unusually teary eyes This list may not describe all possible side effects. Call your doctor for medical advice about side effects. You may report side effects to FDA at 1-800-FDA-1088. Where should I keep my medicine? This drug is given in a hospital or clinic and will not be stored at home. NOTE: This sheet is a summary. It may not cover all possible information. If you have questions about this medicine, talk to your doctor, pharmacist, or health care provider.  2017 Elsevier/Gold Standard (2007-08-25 13:53:16)

## 2016-05-21 NOTE — Progress Notes (Signed)
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 He is a patient of Dr. Julieanne Manson.  Past medical history includes small bowel obstruction,  Ileus and chronic kidney disease stage II.  Medications include Imodium, multivitamin, Compazine, and Phenergan.  Labs include glucose 115, BUN 26, and creatinine 2.34.  Height: 6 feet 4 inches. Weight: 220 pounds. Usual body weight: 255 pounds in December per patient BMI: 26.78.  Patient reports most of his weight loss occurred while he was in the hospital on IV fluids. Reports he was not eating for greater than 2 weeks. Weight loss reflects 14% loss from usual body weight. Patient denies nutrition impact symptoms and currently is eating well. He has no nutrition concerns.  Nutrition diagnosis:  Unintended weight loss related to stage IV colon cancer as evidenced by 14% weight loss from usual body weight.  Intervention: Educated patient to consume high-calorie, high-protein foods for weight maintenance. Provided fact sheet on increasing calories and protein. Questions answered.  Teach back method used.  Contact information provided.  Monitoring, evaluation, goals:  Patient will tolerate adequate calories and protein to maintain weight.  Next visit: No follow-up is scheduled. Encouraged patient to contact me with any weight loss or nutrition concerns.  **Disclaimer: This note was dictated with voice recognition software. Similar sounding words can inadvertently be transcribed and this note may contain transcription errors which may not have been corrected upon publication of note.**

## 2016-05-23 ENCOUNTER — Ambulatory Visit (HOSPITAL_BASED_OUTPATIENT_CLINIC_OR_DEPARTMENT_OTHER): Payer: Medicare Other

## 2016-05-23 VITALS — BP 130/71 | HR 70 | Temp 97.7°F | Resp 18

## 2016-05-23 DIAGNOSIS — C187 Malignant neoplasm of sigmoid colon: Secondary | ICD-10-CM

## 2016-05-23 DIAGNOSIS — Z5111 Encounter for antineoplastic chemotherapy: Secondary | ICD-10-CM | POA: Diagnosis not present

## 2016-05-23 DIAGNOSIS — C189 Malignant neoplasm of colon, unspecified: Secondary | ICD-10-CM

## 2016-05-23 MED ORDER — SODIUM CHLORIDE 0.9% FLUSH
10.0000 mL | INTRAVENOUS | Status: DC | PRN
  Administered 2016-05-23: 10 mL
  Filled 2016-05-23: qty 10

## 2016-05-23 MED ORDER — HEPARIN SOD (PORK) LOCK FLUSH 100 UNIT/ML IV SOLN
500.0000 [IU] | Freq: Once | INTRAVENOUS | Status: AC | PRN
Start: 2016-05-23 — End: 2016-05-23
  Administered 2016-05-23: 500 [IU]
  Filled 2016-05-23: qty 5

## 2016-05-29 ENCOUNTER — Ambulatory Visit: Payer: Medicare Other | Admitting: Nurse Practitioner

## 2016-05-29 ENCOUNTER — Ambulatory Visit: Payer: Medicare Other

## 2016-05-30 DIAGNOSIS — E669 Obesity, unspecified: Secondary | ICD-10-CM | POA: Diagnosis not present

## 2016-05-30 DIAGNOSIS — N179 Acute kidney failure, unspecified: Secondary | ICD-10-CM | POA: Diagnosis not present

## 2016-05-30 DIAGNOSIS — N2581 Secondary hyperparathyroidism of renal origin: Secondary | ICD-10-CM | POA: Diagnosis not present

## 2016-05-30 DIAGNOSIS — I10 Essential (primary) hypertension: Secondary | ICD-10-CM | POA: Diagnosis not present

## 2016-05-30 DIAGNOSIS — N183 Chronic kidney disease, stage 3 (moderate): Secondary | ICD-10-CM | POA: Diagnosis not present

## 2016-05-30 DIAGNOSIS — E785 Hyperlipidemia, unspecified: Secondary | ICD-10-CM | POA: Diagnosis not present

## 2016-05-30 DIAGNOSIS — C187 Malignant neoplasm of sigmoid colon: Secondary | ICD-10-CM | POA: Diagnosis not present

## 2016-06-01 ENCOUNTER — Other Ambulatory Visit: Payer: Self-pay | Admitting: Oncology

## 2016-06-04 ENCOUNTER — Ambulatory Visit (HOSPITAL_BASED_OUTPATIENT_CLINIC_OR_DEPARTMENT_OTHER): Payer: Medicare Other

## 2016-06-04 ENCOUNTER — Encounter: Payer: Self-pay | Admitting: *Deleted

## 2016-06-04 ENCOUNTER — Ambulatory Visit (HOSPITAL_BASED_OUTPATIENT_CLINIC_OR_DEPARTMENT_OTHER): Payer: Medicare Other | Admitting: Oncology

## 2016-06-04 ENCOUNTER — Other Ambulatory Visit (HOSPITAL_BASED_OUTPATIENT_CLINIC_OR_DEPARTMENT_OTHER): Payer: Medicare Other

## 2016-06-04 ENCOUNTER — Ambulatory Visit: Payer: Medicare Other

## 2016-06-04 VITALS — BP 143/79 | HR 78 | Temp 98.2°F | Resp 18 | Ht 76.0 in | Wt 228.0 lb

## 2016-06-04 DIAGNOSIS — C189 Malignant neoplasm of colon, unspecified: Secondary | ICD-10-CM

## 2016-06-04 DIAGNOSIS — Z5111 Encounter for antineoplastic chemotherapy: Secondary | ICD-10-CM | POA: Diagnosis not present

## 2016-06-04 DIAGNOSIS — C187 Malignant neoplasm of sigmoid colon: Secondary | ICD-10-CM

## 2016-06-04 DIAGNOSIS — N189 Chronic kidney disease, unspecified: Secondary | ICD-10-CM | POA: Diagnosis not present

## 2016-06-04 DIAGNOSIS — I10 Essential (primary) hypertension: Secondary | ICD-10-CM | POA: Diagnosis not present

## 2016-06-04 LAB — CBC WITH DIFFERENTIAL/PLATELET
BASO%: 1 % (ref 0.0–2.0)
BASOS ABS: 0.1 10*3/uL (ref 0.0–0.1)
EOS ABS: 0.2 10*3/uL (ref 0.0–0.5)
EOS%: 4 % (ref 0.0–7.0)
HEMATOCRIT: 36 % — AB (ref 38.4–49.9)
HEMOGLOBIN: 11.9 g/dL — AB (ref 13.0–17.1)
LYMPH#: 1.2 10*3/uL (ref 0.9–3.3)
LYMPH%: 24.5 % (ref 14.0–49.0)
MCH: 28.2 pg (ref 27.2–33.4)
MCHC: 33 g/dL (ref 32.0–36.0)
MCV: 85.7 fL (ref 79.3–98.0)
MONO#: 0.7 10*3/uL (ref 0.1–0.9)
MONO%: 13.1 % (ref 0.0–14.0)
NEUT%: 57.4 % (ref 39.0–75.0)
NEUTROS ABS: 2.9 10*3/uL (ref 1.5–6.5)
Platelets: 128 10*3/uL — ABNORMAL LOW (ref 140–400)
RBC: 4.2 10*6/uL (ref 4.20–5.82)
RDW: 16.8 % — AB (ref 11.0–14.6)
WBC: 5 10*3/uL (ref 4.0–10.3)

## 2016-06-04 LAB — COMPREHENSIVE METABOLIC PANEL
ALBUMIN: 3.5 g/dL (ref 3.5–5.0)
ALK PHOS: 87 U/L (ref 40–150)
ALT: 15 U/L (ref 0–55)
AST: 13 U/L (ref 5–34)
Anion Gap: 7 mEq/L (ref 3–11)
BILIRUBIN TOTAL: 0.5 mg/dL (ref 0.20–1.20)
BUN: 24.9 mg/dL (ref 7.0–26.0)
CALCIUM: 9.3 mg/dL (ref 8.4–10.4)
CO2: 26 mEq/L (ref 22–29)
CREATININE: 2.1 mg/dL — AB (ref 0.7–1.3)
Chloride: 109 mEq/L (ref 98–109)
EGFR: 32 mL/min/{1.73_m2} — ABNORMAL LOW (ref >90)
Glucose: 114 mg/dl (ref 70–140)
POTASSIUM: 3.8 meq/L (ref 3.5–5.1)
Sodium: 143 mEq/L (ref 136–145)
TOTAL PROTEIN: 7 g/dL (ref 6.4–8.3)

## 2016-06-04 MED ORDER — FLUOROURACIL CHEMO INJECTION 2.5 GM/50ML
400.0000 mg/m2 | Freq: Once | INTRAVENOUS | Status: AC
  Administered 2016-06-04: 950 mg via INTRAVENOUS
  Filled 2016-06-04: qty 19

## 2016-06-04 MED ORDER — DEXTROSE 5 % IV SOLN
400.0000 mg/m2 | Freq: Once | INTRAVENOUS | Status: AC
  Administered 2016-06-04: 936 mg via INTRAVENOUS
  Filled 2016-06-04: qty 46.8

## 2016-06-04 MED ORDER — PALONOSETRON HCL INJECTION 0.25 MG/5ML
INTRAVENOUS | Status: AC
  Filled 2016-06-04: qty 5

## 2016-06-04 MED ORDER — DEXTROSE 5 % IV SOLN
Freq: Once | INTRAVENOUS | Status: AC
  Administered 2016-06-04: 11:00:00 via INTRAVENOUS

## 2016-06-04 MED ORDER — OXALIPLATIN CHEMO INJECTION 100 MG/20ML
85.0000 mg/m2 | Freq: Once | INTRAVENOUS | Status: AC
  Administered 2016-06-04: 200 mg via INTRAVENOUS
  Filled 2016-06-04: qty 40

## 2016-06-04 MED ORDER — DEXAMETHASONE SODIUM PHOSPHATE 10 MG/ML IJ SOLN
10.0000 mg | Freq: Once | INTRAMUSCULAR | Status: AC
  Administered 2016-06-04: 10 mg via INTRAVENOUS

## 2016-06-04 MED ORDER — PALONOSETRON HCL INJECTION 0.25 MG/5ML
0.2500 mg | Freq: Once | INTRAVENOUS | Status: AC
  Administered 2016-06-04: 0.25 mg via INTRAVENOUS

## 2016-06-04 MED ORDER — SODIUM CHLORIDE 0.9 % IV SOLN
2400.0000 mg/m2 | INTRAVENOUS | Status: DC
  Administered 2016-06-04: 5600 mg via INTRAVENOUS
  Filled 2016-06-04: qty 112

## 2016-06-04 MED ORDER — DEXAMETHASONE SODIUM PHOSPHATE 10 MG/ML IJ SOLN
INTRAMUSCULAR | Status: AC
  Filled 2016-06-04: qty 1

## 2016-06-04 NOTE — Progress Notes (Signed)
CBC/CMET reviewed by MD, ok to treat with Cr 2.1.

## 2016-06-04 NOTE — Patient Instructions (Signed)

## 2016-06-04 NOTE — Progress Notes (Signed)
Oncology Nurse Navigator Documentation  Oncology Nurse Navigator Flowsheets 06/04/2016  Navigator Location [facility]  Referral date to RadOnc/MedOnc -  Navigator Encounter Type Follow-up Appt  Telephone -  Abnormal Finding Date -  Confirmed Diagnosis Date 04/09/2016  Surgery Date 04/10/2016  Treatment Initiated Date -  Patient Visit Type MedOnc  Treatment Phase Active Tx  Barriers/Navigation Needs Coordination of Care--called research department pager [phone] and left message requesting research nurse see patient today in infusion area regarding the MPR-1 registry.  Education -  Interventions Coordination of Care--email request to [facility] for Foundation One testing on case below with request to talk w/pathologist to determine if there is enough tissue for this as well as the MPR-1 registry.  Referrals -  Coordination of Care Other  Education Method -  Support Groups/Services -  Specialty Items/DME Ostomy supplies--provided patient with box of the #[id number] rings he uses for his ostomy. Did not have any of his bags #[id number]. Provided him with contact information for [facility] as requested. He is not satisfied with service of [facility] and declined navigators offer to call them and follow up on his shipment. Gave him list of other ostomy supply companies that ship to home.  Acuity Level 2  Time Spent with Patient [place] Collected: 04/10/2016 Client: [facility] Accession: [id number] Received: 04/11/2016 Louis [name] DOB: October 28, 1945 Age: 71 Gender: M Reported: 04/14/2016 [address] Patient Ph: [phone] MRN #: [id number] [place], [place] [address] Visit #: [id number] Chart #: Phone: [phone] Fax: CC: Kerry Kass, MD REPORT OF SURGICA

## 2016-06-04 NOTE — Progress Notes (Signed)
  [place] [facility] OFFICE PROGRESS NOTE   Diagnosis: Colon cancer  INTERVAL HISTORY:   Louis [name] completed a first cycle of FOLFOX 05/21/2016. He reports mild nausea following chemotherapy, relieved with an anti-emetic. No emesis. He had transient cold sensitivity and tingling in the right hand. No other neuropathy symptoms. No mouth sores or diarrhea.  Objective:  Vital signs in last 24 hours:  Blood pressure (!) 143/79, pulse 78, temperature 98.2 F (36.8 C), temperature source Oral, resp. rate 18, height '6\' 4"'$  (1.93 m), weight 228 lb (103.4 kg), SpO2 99 %.    HEENT: No thrush or ulcers Resp: Lungs clear bilaterally Cardio: Regular rate and rhythm GI: No hepatomegaly, left lower quadrant colostomy with brown stool Vascular: No leg edema  Skin: Palms without erythema   Portacath/PICC-without erythema  Lab Results:  Lab Results  Component Value Date   WBC 5.0 06/04/2016   HGB 11.9 (L) 06/04/2016   HCT 36.0 (L) 06/04/2016   MCV 85.7 06/04/2016   PLT 128 (L) 06/04/2016   NEUTROABS 2.9 06/04/2016     Medications: I have reviewed the patient's current medications.  Assessment/Plan: 1. 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 Sigmoid/descending resection and creation of a descending colostomy 04/10/2016 ? MSI-stable, no loss of mismatch repair protein expression ? CT abdomen/pelvis 04/02/2016-no evidence of distant metastatic disease ? Cycle 1 FOLFOX 05/21/2016 ? Cycle 2 FOLFOX 06/04/2016  2.   Chronic renal insufficiency  3.   Hypertension   Disposition:  Louis [name] tolerated the first cycle of FOLFOX well. The plan is to proceed with cycle 2 today. He will return for an office visit and chemotherapy in 2 weeks.  He is a candidate for the [employer]-MPR genomic study. He agrees to meet with a research nurse to discuss enrollment. We will submit her tumor for Foundation 1 testing in the  event he develops progressive metastatic disease in the future.  Betsy Coder, MD  06/04/2016  10:48 AM

## 2016-06-06 ENCOUNTER — Ambulatory Visit (HOSPITAL_BASED_OUTPATIENT_CLINIC_OR_DEPARTMENT_OTHER): Payer: Medicare Other

## 2016-06-06 VITALS — HR 72 | Temp 98.3°F | Resp 18

## 2016-06-06 DIAGNOSIS — C187 Malignant neoplasm of sigmoid colon: Secondary | ICD-10-CM

## 2016-06-06 DIAGNOSIS — C189 Malignant neoplasm of colon, unspecified: Secondary | ICD-10-CM

## 2016-06-06 DIAGNOSIS — Z452 Encounter for adjustment and management of vascular access device: Secondary | ICD-10-CM | POA: Diagnosis not present

## 2016-06-06 MED ORDER — SODIUM CHLORIDE 0.9% FLUSH
10.0000 mL | INTRAVENOUS | Status: DC | PRN
  Administered 2016-06-06: 10 mL
  Filled 2016-06-06: qty 10

## 2016-06-06 MED ORDER — HEPARIN SOD (PORK) LOCK FLUSH 100 UNIT/ML IV SOLN
500.0000 [IU] | Freq: Once | INTRAVENOUS | Status: AC | PRN
  Administered 2016-06-06: 500 [IU]
  Filled 2016-06-06: qty 5

## 2016-06-09 ENCOUNTER — Telehealth: Payer: Self-pay | Admitting: *Deleted

## 2016-06-09 NOTE — Telephone Encounter (Signed)
Per 1/31 LOS I have scheduled appts and notified the scheduler 

## 2016-06-11 ENCOUNTER — Other Ambulatory Visit (HOSPITAL_COMMUNITY)
Admission: RE | Admit: 2016-06-11 | Discharge: 2016-06-11 | Disposition: A | Discharge status: Home / Self Care | Payer: Medicare Other | Source: Ambulatory Visit | Attending: Oncology | Admitting: Oncology

## 2016-06-11 DIAGNOSIS — C189 Malignant neoplasm of colon, unspecified: Secondary | ICD-10-CM | POA: Insufficient documentation

## 2016-06-11 DIAGNOSIS — C799 Secondary malignant neoplasm of unspecified site: Secondary | ICD-10-CM | POA: Insufficient documentation

## 2016-06-15 ENCOUNTER — Other Ambulatory Visit: Payer: Self-pay | Admitting: Oncology

## 2016-06-16 ENCOUNTER — Telehealth: Payer: Self-pay | Admitting: *Deleted

## 2016-06-16 NOTE — Telephone Encounter (Signed)
Message received from patient stating that he has a cyst on his back that has not been drained yet, looks "red and infected" and would like to know if he needs to keep his appt for chemo on Wednesday, 06/18/16.   [id number]-Return call placed to patient and patient informed per Dr. Benay Spice to be seen today by his PCP or urgent care to assess cyst.  Patient states that he is unable to go to an urgent care today or tomorrow,does not have a PCP and can not come in to [facility] to be seen today d/t his busy work schedule.  He states that he will wait for Dr. Benay Spice to assess his cyst on Wednesday morning and make an appt to see Dr. Hulen Skains for next week regarding cyst.  Patient instructed if he starts to run a fever to call [facility].  Patient has no other questions at this time.  Dr. Benay Spice notified of patient's plan.

## 2016-06-17 ENCOUNTER — Telehealth: Payer: Self-pay | Admitting: *Deleted

## 2016-06-17 NOTE — Telephone Encounter (Signed)
Message received from [place] with [facility] stating per Dr. Lyndon Code there is enough tissue for MPR-1 registry study.  Dr. Benay Spice notified.

## 2016-06-18 ENCOUNTER — Other Ambulatory Visit (HOSPITAL_BASED_OUTPATIENT_CLINIC_OR_DEPARTMENT_OTHER): Payer: Medicare Other

## 2016-06-18 ENCOUNTER — Ambulatory Visit (HOSPITAL_BASED_OUTPATIENT_CLINIC_OR_DEPARTMENT_OTHER): Payer: Medicare Other | Admitting: Oncology

## 2016-06-18 ENCOUNTER — Ambulatory Visit (HOSPITAL_BASED_OUTPATIENT_CLINIC_OR_DEPARTMENT_OTHER): Payer: Medicare Other

## 2016-06-18 VITALS — BP 149/85 | HR 81 | Temp 97.9°F | Resp 18 | Ht 76.0 in | Wt 227.1 lb

## 2016-06-18 DIAGNOSIS — N189 Chronic kidney disease, unspecified: Secondary | ICD-10-CM

## 2016-06-18 DIAGNOSIS — C187 Malignant neoplasm of sigmoid colon: Secondary | ICD-10-CM

## 2016-06-18 DIAGNOSIS — Z5111 Encounter for antineoplastic chemotherapy: Secondary | ICD-10-CM

## 2016-06-18 DIAGNOSIS — D6959 Other secondary thrombocytopenia: Secondary | ICD-10-CM | POA: Diagnosis not present

## 2016-06-18 DIAGNOSIS — I10 Essential (primary) hypertension: Secondary | ICD-10-CM

## 2016-06-18 DIAGNOSIS — C189 Malignant neoplasm of colon, unspecified: Secondary | ICD-10-CM

## 2016-06-18 DIAGNOSIS — I708 Atherosclerosis of other arteries: Secondary | ICD-10-CM

## 2016-06-18 DIAGNOSIS — I700 Atherosclerosis of aorta: Secondary | ICD-10-CM

## 2016-06-18 LAB — COMPREHENSIVE METABOLIC PANEL
ALBUMIN: 3.7 g/dL (ref 3.5–5.0)
ALK PHOS: 91 U/L (ref 40–150)
ALT: 21 U/L (ref 0–55)
AST: 17 U/L (ref 5–34)
Anion Gap: 11 mEq/L (ref 3–11)
BILIRUBIN TOTAL: 0.62 mg/dL (ref 0.20–1.20)
BUN: 26.3 mg/dL — AB (ref 7.0–26.0)
CO2: 24 mEq/L (ref 22–29)
Calcium: 9.7 mg/dL (ref 8.4–10.4)
Chloride: 107 mEq/L (ref 98–109)
Creatinine: 2.3 mg/dL — ABNORMAL HIGH (ref 0.7–1.3)
EGFR: 28 mL/min/{1.73_m2} — ABNORMAL LOW (ref >90)
GLUCOSE: 124 mg/dL (ref 70–140)
Potassium: 4 mEq/L (ref 3.5–5.1)
Sodium: 143 mEq/L (ref 136–145)
TOTAL PROTEIN: 7.4 g/dL (ref 6.4–8.3)

## 2016-06-18 LAB — CBC WITH DIFFERENTIAL/PLATELET
BASO%: 0.9 % (ref 0.0–2.0)
Basophils Absolute: 0 10*3/uL (ref 0.0–0.1)
EOS%: 3.6 % (ref 0.0–7.0)
Eosinophils Absolute: 0.2 10*3/uL (ref 0.0–0.5)
HCT: 38.9 % (ref 38.4–49.9)
HEMOGLOBIN: 12.8 g/dL — AB (ref 13.0–17.1)
LYMPH%: 28.1 % (ref 14.0–49.0)
MCH: 28.6 pg (ref 27.2–33.4)
MCHC: 32.8 g/dL (ref 32.0–36.0)
MCV: 87 fL (ref 79.3–98.0)
MONO#: 0.6 10*3/uL (ref 0.1–0.9)
MONO%: 13.7 % (ref 0.0–14.0)
NEUT%: 53.7 % (ref 39.0–75.0)
NEUTROS ABS: 2.3 10*3/uL (ref 1.5–6.5)
Platelets: 100 10*3/uL — ABNORMAL LOW (ref 140–400)
RBC: 4.47 10*6/uL (ref 4.20–5.82)
RDW: 16.7 % — ABNORMAL HIGH (ref 11.0–14.6)
WBC: 4.3 10*3/uL (ref 4.0–10.3)
lymph#: 1.2 10*3/uL (ref 0.9–3.3)

## 2016-06-18 MED ORDER — FLUOROURACIL CHEMO INJECTION 2.5 GM/50ML
400.0000 mg/m2 | Freq: Once | INTRAVENOUS | Status: AC
  Administered 2016-06-18: 950 mg via INTRAVENOUS
  Filled 2016-06-18: qty 19

## 2016-06-18 MED ORDER — DEXAMETHASONE SODIUM PHOSPHATE 10 MG/ML IJ SOLN
10.0000 mg | Freq: Once | INTRAMUSCULAR | Status: AC
  Administered 2016-06-18: 10 mg via INTRAVENOUS

## 2016-06-18 MED ORDER — SODIUM CHLORIDE 0.9% FLUSH
10.0000 mL | INTRAVENOUS | Status: DC | PRN
  Filled 2016-06-18: qty 10

## 2016-06-18 MED ORDER — LEUCOVORIN CALCIUM INJECTION 350 MG
400.0000 mg/m2 | Freq: Once | INTRAVENOUS | Status: AC
  Administered 2016-06-18: 936 mg via INTRAVENOUS
  Filled 2016-06-18: qty 46.8

## 2016-06-18 MED ORDER — PALONOSETRON HCL INJECTION 0.25 MG/5ML
0.2500 mg | Freq: Once | INTRAVENOUS | Status: AC
  Administered 2016-06-18: 0.25 mg via INTRAVENOUS

## 2016-06-18 MED ORDER — HEPARIN SOD (PORK) LOCK FLUSH 100 UNIT/ML IV SOLN
500.0000 [IU] | Freq: Once | INTRAVENOUS | Status: DC | PRN
  Filled 2016-06-18: qty 5

## 2016-06-18 MED ORDER — DEXTROSE 5 % IV SOLN
Freq: Once | INTRAVENOUS | Status: AC
  Administered 2016-06-18: 10:00:00 via INTRAVENOUS

## 2016-06-18 MED ORDER — OXALIPLATIN CHEMO INJECTION 100 MG/20ML
65.0000 mg/m2 | Freq: Once | INTRAVENOUS | Status: AC
  Administered 2016-06-18: 150 mg via INTRAVENOUS
  Filled 2016-06-18: qty 10

## 2016-06-18 MED ORDER — SODIUM CHLORIDE 0.9 % IV SOLN
2400.0000 mg/m2 | INTRAVENOUS | Status: DC
  Administered 2016-06-18: 5600 mg via INTRAVENOUS
  Filled 2016-06-18: qty 112

## 2016-06-18 MED ORDER — PALONOSETRON HCL INJECTION 0.25 MG/5ML
INTRAVENOUS | Status: AC
  Filled 2016-06-18: qty 5

## 2016-06-18 MED ORDER — DEXAMETHASONE SODIUM PHOSPHATE 10 MG/ML IJ SOLN
INTRAMUSCULAR | Status: AC
  Filled 2016-06-18: qty 1

## 2016-06-18 NOTE — Progress Notes (Signed)
  [place] OFFICE PROGRESS NOTE   Diagnosis: Colon cancer  INTERVAL HISTORY:   Louis [name] completed cycle 2 FOLFOX on 06/04/2016. He had cold sensitivity for a few days following chemotherapy. This has resolved. No other neuropathy symptoms. No mouth sores or diarrhea. The cyst at the upper back is more erythematous. No fever.  Objective:  Vital signs in last 24 hours:  Blood pressure (!) 149/85, pulse 81, temperature 97.9 F (36.6 C), temperature source Oral, resp. rate 18, height '6\' 4"'$  (1.93 m), weight 227 lb 1.6 oz (103 kg), SpO2 99 %.    HEENT: No thrush or ulcers Resp: Lungs clear bilaterally Cardio: Regular rate and rhythm GI: No hepatomegaly, nontender, left lower quadrant colostomy Vascular: No leg edema  Skin: Palms without erythema, there is a sebaceous cyst at the mid upper back with mild surrounding induration-I expressed a small amount of white material with a small amount of blood.   Portacath/PICC-without erythema  Lab Results:  Lab Results  Component Value Date   WBC 4.3 06/18/2016   HGB 12.8 (L) 06/18/2016   HCT 38.9 06/18/2016   MCV 87.0 06/18/2016   PLT 100 (L) 06/18/2016   NEUTROABS 2.3 06/18/2016     Medications: I have reviewed the patient's current medications.  Assessment/Plan: 1. 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 Sigmoid/descending resection and creation of a descending colostomy 04/10/2016 ? MSI-stable, no loss of mismatch repair protein expression ? CT abdomen/pelvis 04/02/2016-no evidence of distant metastatic disease ? Cycle 1 FOLFOX 05/21/2016 ? Cycle 2 FOLFOX 06/04/2016 ? Cycle 3 FOLFOX 06/18/2016  2. Chronic renal insufficiency  3. Hypertension  4.  Inflamed sebaceous cyst at the upper back  5.  Thrombocytopenia secondary chemotherapy-oxaliplatin dose reduced beginning with cycle 3 FOLFOX    Disposition:  [name] is  tolerating the chemotherapy well. The plan is to proceed with cycle 3 FOLFOX today. The oxaliplatin will be dose reduced secondary to thrombus cytopenia. He will contact us for bleeding. He has an inflamed sebaceous cyst at the upper back. He is scheduled for a surgical appointment next week. He will seek medical attention for a fever or increased surrounding erythema. He will meet with the research nurse today to discuss enrollment on the [place]- MPR tissue study.  25 minutes were spent with the patient today. The majority of the time was used for counseling and coordination of care.  Betsy Coder, MD  06/18/2016  8:49 AM

## 2016-06-18 NOTE — Patient Instructions (Addendum)
Return to the [facility] on 10:30 am on Friday for pump disconnect.  [facility] Discharge Instructions for Patients Receiving Chemotherapy  Today you received the following chemotherapy agents:  Oxaliplatin, Leucovorin, and 5FU.  To help prevent nausea and vomiting after your treatment, we encourage you to take your nausea medication as directed.   If you develop nausea and vomiting that is not controlled by your nausea medication, call the clinic.   BELOW ARE SYMPTOMS THAT SHOULD BE REPORTED IMMEDIATELY:  *FEVER GREATER THAN 100.5 F  *CHILLS WITH OR WITHOUT FEVER  NAUSEA AND VOMITING THAT IS NOT CONTROLLED WITH YOUR NAUSEA MEDICATION  *UNUSUAL SHORTNESS OF BREATH  *UNUSUAL BRUISING OR BLEEDING  TENDERNESS IN MOUTH AND THROAT WITH OR WITHOUT PRESENCE OF ULCERS  *URINARY PROBLEMS  *BOWEL PROBLEMS  UNUSUAL RASH Items with * indicate a potential emergency and should be followed up as soon as possible.  Feel free to call the clinic you have any questions or concerns. The clinic phone number is [phone].  Please show the [facility] at check-in to the Emergency Department and triage nurse.

## 2016-06-20 ENCOUNTER — Ambulatory Visit (HOSPITAL_BASED_OUTPATIENT_CLINIC_OR_DEPARTMENT_OTHER): Payer: Medicare Other

## 2016-06-20 VITALS — BP 144/81 | HR 68 | Temp 98.4°F | Resp 18

## 2016-06-20 DIAGNOSIS — Z452 Encounter for adjustment and management of vascular access device: Secondary | ICD-10-CM | POA: Diagnosis not present

## 2016-06-20 DIAGNOSIS — C187 Malignant neoplasm of sigmoid colon: Secondary | ICD-10-CM

## 2016-06-20 DIAGNOSIS — C189 Malignant neoplasm of colon, unspecified: Secondary | ICD-10-CM

## 2016-06-20 MED ORDER — HEPARIN SOD (PORK) LOCK FLUSH 100 UNIT/ML IV SOLN
500.0000 [IU] | Freq: Once | INTRAVENOUS | Status: AC | PRN
  Administered 2016-06-20: 500 [IU]
  Filled 2016-06-20: qty 5

## 2016-06-20 MED ORDER — SODIUM CHLORIDE 0.9% FLUSH
10.0000 mL | INTRAVENOUS | Status: DC | PRN
Start: 2016-06-20 — End: 2016-06-20
  Administered 2016-06-20: 10 mL
  Filled 2016-06-20: qty 10

## 2016-06-20 NOTE — Patient Instructions (Signed)

## 2016-06-22 ENCOUNTER — Telehealth: Payer: Self-pay

## 2016-06-22 NOTE — Telephone Encounter (Signed)
Called and left a message with added appts per 2/14 los

## 2016-06-23 ENCOUNTER — Other Ambulatory Visit: Payer: Self-pay | Admitting: General Surgery

## 2016-06-23 DIAGNOSIS — L723 Sebaceous cyst: Secondary | ICD-10-CM | POA: Diagnosis not present

## 2016-06-23 DIAGNOSIS — L089 Local infection of the skin and subcutaneous tissue, unspecified: Secondary | ICD-10-CM | POA: Diagnosis not present

## 2016-06-23 DIAGNOSIS — Z933 Colostomy status: Secondary | ICD-10-CM | POA: Diagnosis not present

## 2016-06-23 DIAGNOSIS — L98429 Non-pressure chronic ulcer of back with unspecified severity: Secondary | ICD-10-CM | POA: Diagnosis not present

## 2016-06-23 DIAGNOSIS — C799 Secondary malignant neoplasm of unspecified site: Secondary | ICD-10-CM | POA: Diagnosis not present

## 2016-06-29 ENCOUNTER — Other Ambulatory Visit: Payer: Self-pay | Admitting: Oncology

## 2016-06-30 ENCOUNTER — Ambulatory Visit: Payer: Medicare Other | Admitting: Oncology

## 2016-06-30 ENCOUNTER — Other Ambulatory Visit: Payer: Medicare Other

## 2016-07-02 ENCOUNTER — Ambulatory Visit (HOSPITAL_BASED_OUTPATIENT_CLINIC_OR_DEPARTMENT_OTHER): Payer: Medicare Other | Admitting: Oncology

## 2016-07-02 ENCOUNTER — Other Ambulatory Visit (HOSPITAL_BASED_OUTPATIENT_CLINIC_OR_DEPARTMENT_OTHER): Payer: Medicare Other

## 2016-07-02 ENCOUNTER — Encounter: Payer: Self-pay | Admitting: *Deleted

## 2016-07-02 ENCOUNTER — Ambulatory Visit (HOSPITAL_BASED_OUTPATIENT_CLINIC_OR_DEPARTMENT_OTHER): Payer: Medicare Other

## 2016-07-02 VITALS — BP 143/84 | HR 68 | Temp 98.1°F | Resp 18 | Wt 227.5 lb

## 2016-07-02 DIAGNOSIS — Z5111 Encounter for antineoplastic chemotherapy: Secondary | ICD-10-CM

## 2016-07-02 DIAGNOSIS — I700 Atherosclerosis of aorta: Secondary | ICD-10-CM | POA: Diagnosis not present

## 2016-07-02 DIAGNOSIS — C187 Malignant neoplasm of sigmoid colon: Secondary | ICD-10-CM | POA: Diagnosis not present

## 2016-07-02 DIAGNOSIS — I10 Essential (primary) hypertension: Secondary | ICD-10-CM

## 2016-07-02 DIAGNOSIS — C189 Malignant neoplasm of colon, unspecified: Secondary | ICD-10-CM

## 2016-07-02 DIAGNOSIS — Z452 Encounter for adjustment and management of vascular access device: Secondary | ICD-10-CM | POA: Diagnosis not present

## 2016-07-02 DIAGNOSIS — N189 Chronic kidney disease, unspecified: Secondary | ICD-10-CM | POA: Diagnosis not present

## 2016-07-02 DIAGNOSIS — I708 Atherosclerosis of other arteries: Secondary | ICD-10-CM

## 2016-07-02 DIAGNOSIS — I70299 Other atherosclerosis of native arteries of extremities, unspecified extremity: Secondary | ICD-10-CM | POA: Diagnosis not present

## 2016-07-02 DIAGNOSIS — Z95828 Presence of other vascular implants and grafts: Secondary | ICD-10-CM

## 2016-07-02 DIAGNOSIS — D6959 Other secondary thrombocytopenia: Secondary | ICD-10-CM | POA: Diagnosis not present

## 2016-07-02 LAB — CBC WITH DIFFERENTIAL/PLATELET
BASO%: 0.7 % (ref 0.0–2.0)
BASOS ABS: 0 10*3/uL (ref 0.0–0.1)
EOS ABS: 0.2 10*3/uL (ref 0.0–0.5)
EOS%: 4.4 % (ref 0.0–7.0)
HEMATOCRIT: 35.2 % — AB (ref 38.4–49.9)
HEMOGLOBIN: 11.9 g/dL — AB (ref 13.0–17.1)
LYMPH#: 1.2 10*3/uL (ref 0.9–3.3)
LYMPH%: 26.9 % (ref 14.0–49.0)
MCH: 29.2 pg (ref 27.2–33.4)
MCHC: 33.8 g/dL (ref 32.0–36.0)
MCV: 86.3 fL (ref 79.3–98.0)
MONO#: 0.9 10*3/uL (ref 0.1–0.9)
MONO%: 19.3 % — ABNORMAL HIGH (ref 0.0–14.0)
NEUT#: 2.2 10*3/uL (ref 1.5–6.5)
NEUT%: 48.7 % (ref 39.0–75.0)
PLATELETS: 71 10*3/uL — AB (ref 140–400)
RBC: 4.08 10*6/uL — ABNORMAL LOW (ref 4.20–5.82)
RDW: 16.8 % — ABNORMAL HIGH (ref 11.0–14.6)
WBC: 4.6 10*3/uL (ref 4.0–10.3)
nRBC: 0 % (ref 0–0)

## 2016-07-02 LAB — COMPREHENSIVE METABOLIC PANEL
ALK PHOS: 86 U/L (ref 40–150)
ALT: 36 U/L (ref 0–55)
ANION GAP: 10 meq/L (ref 3–11)
AST: 23 U/L (ref 5–34)
Albumin: 3.5 g/dL (ref 3.5–5.0)
BILIRUBIN TOTAL: 0.47 mg/dL (ref 0.20–1.20)
BUN: 26.3 mg/dL — ABNORMAL HIGH (ref 7.0–26.0)
CALCIUM: 9.3 mg/dL (ref 8.4–10.4)
CO2: 23 meq/L (ref 22–29)
Chloride: 109 mEq/L (ref 98–109)
Creatinine: 2.2 mg/dL — ABNORMAL HIGH (ref 0.7–1.3)
EGFR: 30 mL/min/{1.73_m2} — AB (ref >90)
Glucose: 107 mg/dl (ref 70–140)
Potassium: 4 mEq/L (ref 3.5–5.1)
Sodium: 142 mEq/L (ref 136–145)
TOTAL PROTEIN: 7.1 g/dL (ref 6.4–8.3)

## 2016-07-02 MED ORDER — FLUOROURACIL CHEMO INJECTION 5 GM/100ML
2400.0000 mg/m2 | INTRAVENOUS | Status: DC
  Administered 2016-07-02: 5600 mg via INTRAVENOUS
  Filled 2016-07-02: qty 112

## 2016-07-02 MED ORDER — SODIUM CHLORIDE 0.9% FLUSH
10.0000 mL | INTRAVENOUS | Status: DC | PRN
  Administered 2016-07-02: 10 mL via INTRAVENOUS
  Filled 2016-07-02: qty 10

## 2016-07-02 MED ORDER — LEUCOVORIN CALCIUM INJECTION 350 MG
400.0000 mg/m2 | Freq: Once | INTRAMUSCULAR | Status: AC
  Administered 2016-07-02: 936 mg via INTRAVENOUS
  Filled 2016-07-02: qty 46.8

## 2016-07-02 MED ORDER — FLUOROURACIL CHEMO INJECTION 2.5 GM/50ML
400.0000 mg/m2 | Freq: Once | INTRAVENOUS | Status: AC
Start: 2016-07-02 — End: 2016-07-02
  Administered 2016-07-02: 950 mg via INTRAVENOUS
  Filled 2016-07-02: qty 19

## 2016-07-02 NOTE — Progress Notes (Signed)
  [name] OFFICE PROGRESS NOTE   Diagnosis: Colon cancer  INTERVAL HISTORY:   Louis [name] returns as scheduled. He completed cycle 3 FOLFOX 02/15/2017. He reports more nausea following this cycle of chemotherapy. The nausea was relieved with Compazine. No emesis. No mouth sores. He had cold sensitivity for one week following chemotherapy. No other neuropathy symptoms. The sebaceous cyst at the upper back was drained by Dr. Dalbert Batman. A packing remains in place.  Objective:  Vital signs in last 24 hours:  Blood pressure (!) 143/84, pulse 68, temperature 98.1 F (36.7 C), temperature source Oral, resp. rate 18, weight 227 lb 8 oz (103.2 kg), SpO2 99 %.    HEENT: No thrush or ulcers Resp: Lungs clear bilaterally Cardio: Regular rate and rhythm GI: No hepatosplenomegaly, left lower quadrant colostomy Vascular: No leg edema  Skin: Palms without erythema, gauze packing in place at the right upper back drainage site. No surrounding erythema.   Portacath/PICC-without erythema  Lab Results:  Lab Results  Component Value Date   WBC 4.6 07/02/2016   HGB 11.9 (L) 07/02/2016   HCT 35.2 (L) 07/02/2016   MCV 86.3 07/02/2016   PLT 71 (L) 07/02/2016   NEUTROABS 2.2 07/02/2016     Medications: I have reviewed the patient's current medications.  Assessment/Plan: 1. 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 Sigmoid/descending resection and creation of a descending colostomy 04/10/2016 ? MSI-stable, no loss of mismatch repair protein expression ? CT abdomen/pelvis 04/02/2016-no evidence of distant metastatic disease ? Cycle 1 FOLFOX 05/21/2016 ? Cycle 2 FOLFOX 06/04/2016 ? Cycle 3 FOLFOX 06/18/2016 ? Cycle 4 FOLFOX 07/02/2016 (oxaliplatin held secondary to thrombocytopenia)  2. Chronic renal insufficiency  3. Hypertension  4.  Inflamed sebaceous cyst at the upper back-status post incision and  drainage  5.  Thrombocytopenia secondary chemotherapy-oxaliplatin dose reduced beginning with cycle 3 FOLFOX, oxaliplatin held with cycle 4 FOLFOX    Disposition:  Louis [name] has completed 3 cycles of FOLFOX. He has tolerated the chemotherapy well. He has moderate thrombocytopenia. Oxaliplatin will be held from the chemotherapy regimen today. He will contact us for a fever or bleeding. He will return for an office visit and chemotherapy in 2 weeks.  25 minutes were spent with the patient today. The majority of the time was used for counseling and coordination of care.  Betsy Coder, MD  07/02/2016  9:36 AM

## 2016-07-02 NOTE — Progress Notes (Signed)
Signed MD orders faxed to [facility] at [phone] for ostomy supplies.

## 2016-07-02 NOTE — Patient Instructions (Signed)
[facility] Discharge Instructions for Patients Receiving Chemotherapy  Today you received the following chemotherapy agents :  Leucovorin, Fluorouracil.  To help prevent nausea and vomiting after your treatment, we encourage you to take your nausea medication as prescribed.   If you develop nausea and vomiting that is not controlled by your nausea medication, call the clinic.   BELOW ARE SYMPTOMS THAT SHOULD BE REPORTED IMMEDIATELY:  *FEVER GREATER THAN 100.5 F  *CHILLS WITH OR WITHOUT FEVER  NAUSEA AND VOMITING THAT IS NOT CONTROLLED WITH YOUR NAUSEA MEDICATION  *UNUSUAL SHORTNESS OF BREATH  *UNUSUAL BRUISING OR BLEEDING  TENDERNESS IN MOUTH AND THROAT WITH OR WITHOUT PRESENCE OF ULCERS  *URINARY PROBLEMS  *BOWEL PROBLEMS  UNUSUAL RASH Items with * indicate a potential emergency and should be followed up as soon as possible.  Feel free to call the clinic you have any questions or concerns. The clinic phone number is [phone].  Please show the CHEMO ALERT CARD at check-in to the Emergency Department and triage nurse.   

## 2016-07-02 NOTE — Progress Notes (Signed)
OK to treat today with platelets 71 per Dr. Benay Spice.  Oxaliplatin to be removed from treatment plan today per Dr. Benay Spice.

## 2016-07-03 LAB — LIPID PANEL
Chol/HDL Ratio: 3.4 ratio units (ref 0.0–5.0)
Cholesterol, Total: 159 mg/dL (ref 100–199)
HDL: 47 mg/dL (ref >39)
LDL Calculated: 83 mg/dL (ref 0–99)
Triglycerides: 146 mg/dL (ref 0–149)
VLDL Cholesterol Cal: 29 mg/dL (ref 5–40)

## 2016-07-04 ENCOUNTER — Ambulatory Visit (HOSPITAL_BASED_OUTPATIENT_CLINIC_OR_DEPARTMENT_OTHER): Payer: Medicare Other

## 2016-07-04 VITALS — BP 146/70 | HR 73 | Temp 98.4°F | Resp 18

## 2016-07-04 DIAGNOSIS — C187 Malignant neoplasm of sigmoid colon: Secondary | ICD-10-CM | POA: Diagnosis not present

## 2016-07-04 DIAGNOSIS — C189 Malignant neoplasm of colon, unspecified: Secondary | ICD-10-CM

## 2016-07-04 DIAGNOSIS — Z452 Encounter for adjustment and management of vascular access device: Secondary | ICD-10-CM

## 2016-07-04 DIAGNOSIS — Z95828 Presence of other vascular implants and grafts: Secondary | ICD-10-CM

## 2016-07-04 MED ORDER — SODIUM CHLORIDE 0.9% FLUSH
10.0000 mL | INTRAVENOUS | Status: DC | PRN
  Administered 2016-07-04: 10 mL via INTRAVENOUS
  Filled 2016-07-04: qty 10

## 2016-07-04 MED ORDER — SODIUM CHLORIDE 0.9% FLUSH
10.0000 mL | INTRAVENOUS | Status: DC | PRN
  Filled 2016-07-04: qty 10

## 2016-07-04 MED ORDER — HEPARIN SOD (PORK) LOCK FLUSH 100 UNIT/ML IV SOLN
500.0000 [IU] | Freq: Once | INTRAVENOUS | Status: DC | PRN
  Filled 2016-07-04: qty 5

## 2016-07-04 MED ORDER — HEPARIN SOD (PORK) LOCK FLUSH 100 UNIT/ML IV SOLN
500.0000 [IU] | Freq: Once | INTRAVENOUS | Status: AC | PRN
  Administered 2016-07-04: 500 [IU] via INTRAVENOUS
  Filled 2016-07-04: qty 5

## 2016-07-04 NOTE — Patient Instructions (Signed)

## 2016-07-13 ENCOUNTER — Other Ambulatory Visit: Payer: Self-pay | Admitting: Oncology

## 2016-07-14 ENCOUNTER — Other Ambulatory Visit: Payer: Medicare Other

## 2016-07-14 ENCOUNTER — Ambulatory Visit: Payer: Medicare Other | Admitting: Oncology

## 2016-07-16 ENCOUNTER — Ambulatory Visit (HOSPITAL_COMMUNITY)
Admission: RE | Admit: 2016-07-16 | Discharge: 2016-07-16 | Disposition: A | Discharge status: Home / Self Care | Payer: Medicare Other | Source: Ambulatory Visit | Attending: Oncology | Admitting: Oncology

## 2016-07-16 ENCOUNTER — Ambulatory Visit (HOSPITAL_BASED_OUTPATIENT_CLINIC_OR_DEPARTMENT_OTHER): Payer: Medicare Other

## 2016-07-16 ENCOUNTER — Other Ambulatory Visit: Payer: Self-pay | Admitting: *Deleted

## 2016-07-16 ENCOUNTER — Other Ambulatory Visit (HOSPITAL_BASED_OUTPATIENT_CLINIC_OR_DEPARTMENT_OTHER): Payer: Medicare Other

## 2016-07-16 ENCOUNTER — Ambulatory Visit (HOSPITAL_BASED_OUTPATIENT_CLINIC_OR_DEPARTMENT_OTHER): Payer: Medicare Other | Admitting: Oncology

## 2016-07-16 VITALS — BP 140/83 | HR 87 | Temp 98.4°F | Resp 18 | Wt 226.7 lb

## 2016-07-16 DIAGNOSIS — C189 Malignant neoplasm of colon, unspecified: Secondary | ICD-10-CM | POA: Diagnosis present

## 2016-07-16 DIAGNOSIS — N189 Chronic kidney disease, unspecified: Secondary | ICD-10-CM | POA: Diagnosis not present

## 2016-07-16 DIAGNOSIS — J90 Pleural effusion, not elsewhere classified: Secondary | ICD-10-CM | POA: Insufficient documentation

## 2016-07-16 DIAGNOSIS — C187 Malignant neoplasm of sigmoid colon: Secondary | ICD-10-CM

## 2016-07-16 DIAGNOSIS — Z452 Encounter for adjustment and management of vascular access device: Secondary | ICD-10-CM | POA: Diagnosis not present

## 2016-07-16 DIAGNOSIS — M5134 Other intervertebral disc degeneration, thoracic region: Secondary | ICD-10-CM | POA: Diagnosis not present

## 2016-07-16 DIAGNOSIS — I10 Essential (primary) hypertension: Secondary | ICD-10-CM

## 2016-07-16 DIAGNOSIS — Z95828 Presence of other vascular implants and grafts: Secondary | ICD-10-CM

## 2016-07-16 DIAGNOSIS — Z5111 Encounter for antineoplastic chemotherapy: Secondary | ICD-10-CM

## 2016-07-16 DIAGNOSIS — D6959 Other secondary thrombocytopenia: Secondary | ICD-10-CM

## 2016-07-16 DIAGNOSIS — R05 Cough: Secondary | ICD-10-CM | POA: Diagnosis not present

## 2016-07-16 LAB — CBC WITH DIFFERENTIAL/PLATELET
BASO%: 0.4 % (ref 0.0–2.0)
BASOS ABS: 0 10*3/uL (ref 0.0–0.1)
EOS ABS: 0.1 10*3/uL (ref 0.0–0.5)
EOS%: 1.7 % (ref 0.0–7.0)
HCT: 35.5 % — ABNORMAL LOW (ref 38.4–49.9)
HGB: 12 g/dL — ABNORMAL LOW (ref 13.0–17.1)
LYMPH%: 18.4 % (ref 14.0–49.0)
MCH: 29.3 pg (ref 27.2–33.4)
MCHC: 33.8 g/dL (ref 32.0–36.0)
MCV: 86.8 fL (ref 79.3–98.0)
MONO#: 1.1 10*3/uL — ABNORMAL HIGH (ref 0.1–0.9)
MONO%: 15.5 % — AB (ref 0.0–14.0)
NEUT#: 4.6 10*3/uL (ref 1.5–6.5)
NEUT%: 64 % (ref 39.0–75.0)
PLATELETS: 91 10*3/uL — AB (ref 140–400)
RBC: 4.09 10*6/uL — AB (ref 4.20–5.82)
RDW: 16.9 % — AB (ref 11.0–14.6)
WBC: 7.2 10*3/uL (ref 4.0–10.3)
lymph#: 1.3 10*3/uL (ref 0.9–3.3)
nRBC: 0 % (ref 0–0)

## 2016-07-16 LAB — COMPREHENSIVE METABOLIC PANEL
ALBUMIN: 3.5 g/dL (ref 3.5–5.0)
ALT: 24 U/L (ref 0–55)
AST: 15 U/L (ref 5–34)
Alkaline Phosphatase: 94 U/L (ref 40–150)
Anion Gap: 12 mEq/L — ABNORMAL HIGH (ref 3–11)
BILIRUBIN TOTAL: 0.7 mg/dL (ref 0.20–1.20)
BUN: 25.4 mg/dL (ref 7.0–26.0)
CALCIUM: 9.8 mg/dL (ref 8.4–10.4)
CHLORIDE: 105 meq/L (ref 98–109)
CO2: 24 mEq/L (ref 22–29)
CREATININE: 2.2 mg/dL — AB (ref 0.7–1.3)
EGFR: 30 mL/min/{1.73_m2} — AB (ref >90)
GLUCOSE: 127 mg/dL (ref 70–140)
POTASSIUM: 3.9 meq/L (ref 3.5–5.1)
SODIUM: 141 meq/L (ref 136–145)
Total Protein: 7.3 g/dL (ref 6.4–8.3)

## 2016-07-16 LAB — CEA (IN HOUSE-CHCC): CEA (CHCC-In House): 1.53 ng/mL (ref 0.00–5.00)

## 2016-07-16 LAB — TECHNOLOGIST REVIEW

## 2016-07-16 MED ORDER — DEXTROSE 5 % IV SOLN
Freq: Once | INTRAVENOUS | Status: AC
  Administered 2016-07-16: 11:00:00 via INTRAVENOUS

## 2016-07-16 MED ORDER — DEXTROSE 5 % IV SOLN
65.0000 mg/m2 | Freq: Once | INTRAVENOUS | Status: AC
  Administered 2016-07-16: 150 mg via INTRAVENOUS
  Filled 2016-07-16: qty 20

## 2016-07-16 MED ORDER — SODIUM CHLORIDE 0.9 % IV SOLN
2400.0000 mg/m2 | INTRAVENOUS | Status: DC
  Administered 2016-07-16: 5600 mg via INTRAVENOUS
  Filled 2016-07-16: qty 112

## 2016-07-16 MED ORDER — PALONOSETRON HCL INJECTION 0.25 MG/5ML
0.2500 mg | Freq: Once | INTRAVENOUS | Status: AC
  Administered 2016-07-16: 0.25 mg via INTRAVENOUS

## 2016-07-16 MED ORDER — SODIUM CHLORIDE 0.9% FLUSH
10.0000 mL | INTRAVENOUS | Status: DC | PRN
  Administered 2016-07-16: 10 mL via INTRAVENOUS
  Filled 2016-07-16: qty 10

## 2016-07-16 MED ORDER — DEXTROSE 5 % IV SOLN
400.0000 mg/m2 | Freq: Once | INTRAVENOUS | Status: AC
  Administered 2016-07-16: 936 mg via INTRAVENOUS
  Filled 2016-07-16: qty 46.8

## 2016-07-16 MED ORDER — FLUOROURACIL CHEMO INJECTION 2.5 GM/50ML
400.0000 mg/m2 | Freq: Once | INTRAVENOUS | Status: AC
  Administered 2016-07-16: 950 mg via INTRAVENOUS
  Filled 2016-07-16: qty 19

## 2016-07-16 MED ORDER — DEXAMETHASONE SODIUM PHOSPHATE 10 MG/ML IJ SOLN
10.0000 mg | Freq: Once | INTRAMUSCULAR | Status: AC
  Administered 2016-07-16: 10 mg via INTRAVENOUS

## 2016-07-16 MED ORDER — DEXAMETHASONE SODIUM PHOSPHATE 10 MG/ML IJ SOLN
INTRAMUSCULAR | Status: AC
  Filled 2016-07-16: qty 1

## 2016-07-16 MED ORDER — PALONOSETRON HCL INJECTION 0.25 MG/5ML
INTRAVENOUS | Status: AC
  Filled 2016-07-16: qty 5

## 2016-07-16 NOTE — Patient Instructions (Signed)
[facility] Discharge Instructions for Patients Receiving Chemotherapy  Today you received the following chemotherapy agents Oxaliplatin, Leucovorin and Adrucil.  To help prevent nausea and vomiting after your treatment, we encourage you to take your nausea medication as prescribed.   If you develop nausea and vomiting that is not controlled by your nausea medication, call the clinic.   BELOW ARE SYMPTOMS THAT SHOULD BE REPORTED IMMEDIATELY:  *FEVER GREATER THAN 100.5 F  *CHILLS WITH OR WITHOUT FEVER  NAUSEA AND VOMITING THAT IS NOT CONTROLLED WITH YOUR NAUSEA MEDICATION  *UNUSUAL SHORTNESS OF BREATH  *UNUSUAL BRUISING OR BLEEDING  TENDERNESS IN MOUTH AND THROAT WITH OR WITHOUT PRESENCE OF ULCERS  *URINARY PROBLEMS  *BOWEL PROBLEMS  UNUSUAL RASH Items with * indicate a potential emergency and should be followed up as soon as possible.  Feel free to call the clinic you have any questions or concerns. The clinic phone number is [phone].  Please show the [facility] at check-in to the Emergency Department and triage nurse.

## 2016-07-16 NOTE — Progress Notes (Addendum)
  [place] OFFICE PROGRESS NOTE   Diagnosis: Colon cancer  INTERVAL HISTORY:   Louis [name] completed another cycle of chemotherapy 07/02/2016. Oxaliplatin was held secondary to thrombocytopenia. He tolerated chemotherapy well. No nausea or diarrhea. No neuropathy symptoms. He reports the surgical site at the right upper back is healing. He developed a cough and bilateral lower chest discomfort yesterday. He slept in a chair. No fever. Mild dyspnea. No leg swelling or pain.  Objective:  Vital signs in last 24 hours:  Blood pressure 140/83, pulse 87, temperature 98.4 F (36.9 C), temperature source Oral, resp. rate 18, weight 226 lb 11.2 oz (102.8 kg), SpO2 97 %.    HEENT: No thrush or ulcers Resp: Decreased breath sounds with end inspiratory rhonchi at the left posterior base, no respiratory distress Cardio: Regular rate and rhythm GI: No hepatomegaly, nontender, left lower quadrant colostomy Vascular: No leg edema   Portacath/PICC-without erythema  Lab Results:  Lab Results  Component Value Date   WBC 7.2 07/16/2016   HGB 12.0 (L) 07/16/2016   HCT 35.5 (L) 07/16/2016   MCV 86.8 07/16/2016   PLT 91 (L) 07/16/2016   NEUTROABS 4.6 07/16/2016    Medications: I have reviewed the patient's current medications.  Assessment/Plan: 1. Sigmoid colon cancer, stage IV (A0T,M2U,Q3F), isolated mesenteric implant-resected, multiple tumor deposits ? Sigmoid/descending resection and creation of a descending colostomy 04/10/2016 ? MSI-stable, no loss of mismatch repair protein expression ? Foundation 1- BRAF V600E positive, MS-stable, intermediate tumor mutation burden, no RAS mutation ? CT abdomen/pelvis 04/02/2016-no evidence of distant metastatic disease ? Cycle 1 FOLFOX 05/21/2016 ? Cycle 2 FOLFOX 06/04/2016 ? Cycle 3 FOLFOX 06/18/2016 ? Cycle 4 FOLFOX 07/02/2016 (oxaliplatin held secondary to thrombocytopenia) ? Cycle 5 FOLFOX 07/16/2016  2. Chronic renal  insufficiency  3. Hypertension  4. Inflamed sebaceous cyst at the upper back-status post incision and drainage  5. Thrombocytopenia secondary chemotherapy-oxaliplatin dose reduced beginning with cycle 3 FOLFOX, oxaliplatin held with cycle 4 FOLFOX    Disposition:  His overall status appears stable. We will obtain a chest x-ray to rule out pneumonia prior to the cycle of chemotherapy today. I have a low clinical suspicion for a pulmonary embolism.  The plan is to proceed with cycle 5 FOLFOX today. He will receive oxaliplatin with this cycle.  We will follow-up on the CEA from today.  Louis [name] will return for an office visit and chemotherapy in 2 weeks.  25 minutes were spent with the patient today. The majority of the time was used for counseling and coordination of care.  Betsy Coder, MD  07/16/2016  9:56 AM

## 2016-07-17 ENCOUNTER — Telehealth: Payer: Self-pay | Admitting: *Deleted

## 2016-07-17 NOTE — Telephone Encounter (Signed)
-----   Message from Ladell Pier, MD sent at 07/16/2016  8:51 PM EDT ----- Please call patient, cea is normal

## 2016-07-17 NOTE — Telephone Encounter (Signed)
Lab result discussed with patient. Pt verbalized an understanding.

## 2016-07-18 ENCOUNTER — Ambulatory Visit (HOSPITAL_BASED_OUTPATIENT_CLINIC_OR_DEPARTMENT_OTHER): Payer: Medicare Other

## 2016-07-18 VITALS — BP 130/67 | HR 62 | Temp 98.2°F | Resp 18

## 2016-07-18 DIAGNOSIS — C189 Malignant neoplasm of colon, unspecified: Secondary | ICD-10-CM

## 2016-07-18 DIAGNOSIS — Z452 Encounter for adjustment and management of vascular access device: Secondary | ICD-10-CM | POA: Diagnosis not present

## 2016-07-18 DIAGNOSIS — C187 Malignant neoplasm of sigmoid colon: Secondary | ICD-10-CM

## 2016-07-18 MED ORDER — HEPARIN SOD (PORK) LOCK FLUSH 100 UNIT/ML IV SOLN
500.0000 [IU] | Freq: Once | INTRAVENOUS | Status: AC | PRN
Start: 2016-07-18 — End: 2016-07-18
  Administered 2016-07-18: 500 [IU]
  Filled 2016-07-18: qty 5

## 2016-07-18 MED ORDER — SODIUM CHLORIDE 0.9% FLUSH
10.0000 mL | INTRAVENOUS | Status: DC | PRN
  Administered 2016-07-18: 10 mL
  Filled 2016-07-18: qty 10

## 2016-07-18 NOTE — Patient Instructions (Signed)
Implanted Port Home Guide An implanted port is a type of central line that is placed under the skin. Central lines are used to provide IV access when treatment or nutrition needs to be given through a person's veins. Implanted ports are used for long-term IV access. An implanted port may be placed because:  You need IV medicine that would be irritating to the small veins in your hands or arms.  You need long-term IV medicines, such as antibiotics.  You need IV nutrition for a long period.  You need frequent blood draws for lab tests.  You need dialysis.  Implanted ports are usually placed in the chest area, but they can also be placed in the upper arm, the abdomen, or the leg. An implanted port has two main parts:  Reservoir. The reservoir is round and will appear as a small, raised area under your skin. The reservoir is the part where a needle is inserted to give medicines or draw blood.  Catheter. The catheter is a thin, flexible tube that extends from the reservoir. The catheter is placed into a large vein. Medicine that is inserted into the reservoir goes into the catheter and then into the vein.  How will I care for my incision site? Do not get the incision site wet. Bathe or shower as directed by your health care provider. How is my port accessed? Special steps must be taken to access the port:  Before the port is accessed, a numbing cream can be placed on the skin. This helps numb the skin over the port site.  Your health care provider uses a sterile technique to access the port. ? Your health care provider must put on a mask and sterile gloves. ? The skin over your port is cleaned carefully with an antiseptic and allowed to dry. ? The port is gently pinched between sterile gloves, and a needle is inserted into the port.  Only "non-coring" port needles should be used to access the port. Once the port is accessed, a blood return should be checked. This helps ensure that the port  is in the vein and is not clogged.  If your port needs to remain accessed for a constant infusion, a clear (transparent) bandage will be placed over the needle site. The bandage and needle will need to be changed every week, or as directed by your health care provider.  Keep the bandage covering the needle clean and dry. Do not get it wet. Follow your health care provider's instructions on how to take a shower or bath while the port is accessed.  If your port does not need to stay accessed, no bandage is needed over the port.  What is flushing? Flushing helps keep the port from getting clogged. Follow your health care provider's instructions on how and when to flush the port. Ports are usually flushed with saline solution or a medicine called heparin. The need for flushing will depend on how the port is used.  If the port is used for intermittent medicines or blood draws, the port will need to be flushed: ? After medicines have been given. ? After blood has been drawn. ? As part of routine maintenance.  If a constant infusion is running, the port may not need to be flushed.  How long will my port stay implanted? The port can stay in for as long as your health care provider thinks it is needed. When it is time for the port to come out, surgery will be   done to remove it. The procedure is similar to the one performed when the port was put in. When should I seek immediate medical care? When you have an implanted port, you should seek immediate medical care if:  You notice a bad smell coming from the incision site.  You have swelling, redness, or drainage at the incision site.  You have more swelling or pain at the port site or the surrounding area.  You have a fever that is not controlled with medicine.  This information is not intended to replace advice given to you by your health care provider. Make sure you discuss any questions you have with your health care provider. Document  Released: 04/21/2005 Document Revised: 09/27/2015 Document Reviewed: 12/27/2012 Elsevier Interactive Patient Education  2017 [employer] Inc.  

## 2016-07-27 ENCOUNTER — Other Ambulatory Visit: Payer: Self-pay | Admitting: Oncology

## 2016-07-30 ENCOUNTER — Ambulatory Visit (HOSPITAL_BASED_OUTPATIENT_CLINIC_OR_DEPARTMENT_OTHER): Payer: Medicare Other

## 2016-07-30 ENCOUNTER — Other Ambulatory Visit (HOSPITAL_BASED_OUTPATIENT_CLINIC_OR_DEPARTMENT_OTHER): Payer: Medicare Other

## 2016-07-30 ENCOUNTER — Ambulatory Visit (HOSPITAL_BASED_OUTPATIENT_CLINIC_OR_DEPARTMENT_OTHER): Payer: Medicare Other | Admitting: Oncology

## 2016-07-30 ENCOUNTER — Telehealth: Payer: Self-pay | Admitting: Oncology

## 2016-07-30 VITALS — BP 128/71 | HR 65 | Temp 97.7°F | Resp 18 | Ht 76.0 in | Wt 224.6 lb

## 2016-07-30 DIAGNOSIS — C189 Malignant neoplasm of colon, unspecified: Secondary | ICD-10-CM

## 2016-07-30 DIAGNOSIS — Z95828 Presence of other vascular implants and grafts: Secondary | ICD-10-CM

## 2016-07-30 DIAGNOSIS — N189 Chronic kidney disease, unspecified: Secondary | ICD-10-CM

## 2016-07-30 DIAGNOSIS — C187 Malignant neoplasm of sigmoid colon: Secondary | ICD-10-CM

## 2016-07-30 DIAGNOSIS — D6959 Other secondary thrombocytopenia: Secondary | ICD-10-CM

## 2016-07-30 DIAGNOSIS — Z5111 Encounter for antineoplastic chemotherapy: Secondary | ICD-10-CM | POA: Diagnosis not present

## 2016-07-30 DIAGNOSIS — Z452 Encounter for adjustment and management of vascular access device: Secondary | ICD-10-CM

## 2016-07-30 LAB — COMPREHENSIVE METABOLIC PANEL
ALT: 22 U/L (ref 0–55)
AST: 17 U/L (ref 5–34)
Albumin: 3.4 g/dL — ABNORMAL LOW (ref 3.5–5.0)
Alkaline Phosphatase: 89 U/L (ref 40–150)
Anion Gap: 10 mEq/L (ref 3–11)
BUN: 29.5 mg/dL — ABNORMAL HIGH (ref 7.0–26.0)
CALCIUM: 9.4 mg/dL (ref 8.4–10.4)
CHLORIDE: 110 meq/L — AB (ref 98–109)
CO2: 21 mEq/L — ABNORMAL LOW (ref 22–29)
Creatinine: 2.2 mg/dL — ABNORMAL HIGH (ref 0.7–1.3)
EGFR: 29 mL/min/{1.73_m2} — ABNORMAL LOW (ref >90)
Glucose: 106 mg/dl (ref 70–140)
POTASSIUM: 3.9 meq/L (ref 3.5–5.1)
SODIUM: 141 meq/L (ref 136–145)
Total Bilirubin: 0.42 mg/dL (ref 0.20–1.20)
Total Protein: 6.9 g/dL (ref 6.4–8.3)

## 2016-07-30 LAB — CBC WITH DIFFERENTIAL/PLATELET
BASO%: 1 % (ref 0.0–2.0)
BASOS ABS: 0 10*3/uL (ref 0.0–0.1)
EOS%: 3.8 % (ref 0.0–7.0)
Eosinophils Absolute: 0.2 10*3/uL (ref 0.0–0.5)
HEMATOCRIT: 35 % — AB (ref 38.4–49.9)
HGB: 11.8 g/dL — ABNORMAL LOW (ref 13.0–17.1)
LYMPH%: 24.8 % (ref 14.0–49.0)
MCH: 29.5 pg (ref 27.2–33.4)
MCHC: 33.6 g/dL (ref 32.0–36.0)
MCV: 87.9 fL (ref 79.3–98.0)
MONO#: 1 10*3/uL — ABNORMAL HIGH (ref 0.1–0.9)
MONO%: 20.6 % — AB (ref 0.0–14.0)
NEUT#: 2.4 10*3/uL (ref 1.5–6.5)
NEUT%: 49.8 % (ref 39.0–75.0)
Platelets: 122 10*3/uL — ABNORMAL LOW (ref 140–400)
RBC: 3.98 10*6/uL — ABNORMAL LOW (ref 4.20–5.82)
RDW: 17.6 % — ABNORMAL HIGH (ref 11.0–14.6)
WBC: 4.8 10*3/uL (ref 4.0–10.3)
lymph#: 1.2 10*3/uL (ref 0.9–3.3)

## 2016-07-30 MED ORDER — PALONOSETRON HCL INJECTION 0.25 MG/5ML
0.2500 mg | Freq: Once | INTRAVENOUS | Status: AC
  Administered 2016-07-30: 0.25 mg via INTRAVENOUS

## 2016-07-30 MED ORDER — OXALIPLATIN CHEMO INJECTION 100 MG/20ML
65.0000 mg/m2 | Freq: Once | INTRAVENOUS | Status: AC
  Administered 2016-07-30: 150 mg via INTRAVENOUS
  Filled 2016-07-30: qty 20

## 2016-07-30 MED ORDER — PALONOSETRON HCL INJECTION 0.25 MG/5ML
INTRAVENOUS | Status: AC
  Filled 2016-07-30: qty 5

## 2016-07-30 MED ORDER — HEPARIN SOD (PORK) LOCK FLUSH 100 UNIT/ML IV SOLN
500.0000 [IU] | Freq: Once | INTRAVENOUS | Status: DC | PRN
  Filled 2016-07-30: qty 5

## 2016-07-30 MED ORDER — LEUCOVORIN CALCIUM INJECTION 350 MG
400.0000 mg/m2 | Freq: Once | INTRAVENOUS | Status: AC
  Administered 2016-07-30: 936 mg via INTRAVENOUS
  Filled 2016-07-30: qty 46.8

## 2016-07-30 MED ORDER — SODIUM CHLORIDE 0.9 % IV SOLN
2400.0000 mg/m2 | INTRAVENOUS | Status: DC
  Administered 2016-07-30: 5600 mg via INTRAVENOUS
  Filled 2016-07-30: qty 112

## 2016-07-30 MED ORDER — SODIUM CHLORIDE 0.9% FLUSH
10.0000 mL | INTRAVENOUS | Status: DC | PRN
  Administered 2016-07-30: 10 mL via INTRAVENOUS
  Filled 2016-07-30: qty 10

## 2016-07-30 MED ORDER — SODIUM CHLORIDE 0.9% FLUSH
10.0000 mL | INTRAVENOUS | Status: DC | PRN
  Filled 2016-07-30: qty 10

## 2016-07-30 MED ORDER — DEXAMETHASONE SODIUM PHOSPHATE 10 MG/ML IJ SOLN
10.0000 mg | Freq: Once | INTRAMUSCULAR | Status: AC
  Administered 2016-07-30: 10 mg via INTRAVENOUS

## 2016-07-30 MED ORDER — DEXTROSE 5 % IV SOLN
Freq: Once | INTRAVENOUS | Status: AC
  Administered 2016-07-30: 10:00:00 via INTRAVENOUS

## 2016-07-30 MED ORDER — DEXAMETHASONE SODIUM PHOSPHATE 10 MG/ML IJ SOLN
INTRAMUSCULAR | Status: AC
  Filled 2016-07-30: qty 1

## 2016-07-30 MED ORDER — FLUOROURACIL CHEMO INJECTION 2.5 GM/50ML
400.0000 mg/m2 | Freq: Once | INTRAVENOUS | Status: AC
  Administered 2016-07-30: 950 mg via INTRAVENOUS
  Filled 2016-07-30: qty 19

## 2016-07-30 NOTE — Patient Instructions (Signed)
[facility] Discharge Instructions for Patients Receiving Chemotherapy  Today you received the following chemotherapy agents Oxaliplatin, Leucovorin and Adrucil.  To help prevent nausea and vomiting after your treatment, we encourage you to take your nausea medication as prescribed.   If you develop nausea and vomiting that is not controlled by your nausea medication, call the clinic.   BELOW ARE SYMPTOMS THAT SHOULD BE REPORTED IMMEDIATELY:  *FEVER GREATER THAN 100.5 F  *CHILLS WITH OR WITHOUT FEVER  NAUSEA AND VOMITING THAT IS NOT CONTROLLED WITH YOUR NAUSEA MEDICATION  *UNUSUAL SHORTNESS OF BREATH  *UNUSUAL BRUISING OR BLEEDING  TENDERNESS IN MOUTH AND THROAT WITH OR WITHOUT PRESENCE OF ULCERS  *URINARY PROBLEMS  *BOWEL PROBLEMS  UNUSUAL RASH Items with * indicate a potential emergency and should be followed up as soon as possible.  Feel free to call the clinic you have any questions or concerns. The clinic phone number is [phone].  Please show the [facility] at check-in to the Emergency Department and triage nurse.

## 2016-07-30 NOTE — Telephone Encounter (Signed)
Patient bypassed scheduling/check out. Lab, flush, infusion and pump D/C scheduled for 05/02 and 05/04, per 07/30/16 los. Per Charge Nurse/Loren, will give patient a copy of the updated schedule.

## 2016-07-30 NOTE — Progress Notes (Signed)
  [facility] OFFICE PROGRESS NOTE   Diagnosis: Colon cancer  INTERVAL HISTORY:   Mr. Remmel completed another cycle of FOLFOX on 07/16/2016. No nausea or significant diarrhea following chemotherapy. He had one ulcer over the tongue that has healed. He had cold sensitivity. No neuropathy symptoms at present. He feels dryness and thickening of the skin at the fingers. This does not interfere with activity.  Objective:  Vital signs in last 24 hours:  Blood pressure 128/71, pulse 65, temperature 97.7 F (36.5 C), temperature source Oral, resp. rate 18, height '6\' 4"'$  (1.93 m), weight 224 lb 9.6 oz (101.9 kg), SpO2 99 %.    HEENT: No thrush or ulcers Resp: Lungs clear bilaterally Cardio: Regular rate and rhythm GI: No hepatosplenomegaly, left lower quadrant colostomy Vascular: No leg edema Neuro: Mild loss of vibratory sense at the fingertips bilaterally  Skin: Mild dryness of the palms, no skin breakdown , the lesion at the surgical defect at the right upper back has healed  Portacath/PICC-without erythema  Lab Results:  Lab Results  Component Value Date   WBC 4.8 07/30/2016   HGB 11.8 (L) 07/30/2016   HCT 35.0 (L) 07/30/2016   MCV 87.9 07/30/2016   PLT 122 (L) 07/30/2016   NEUTROABS 2.4 07/30/2016   CEA on 07/16/2016: 1.53  Medications: I have reviewed the patient's current medications.  Assessment/Plan: 1. Sigmoid colon cancer, stage IV (K0U,R4Y,H0W), isolated mesenteric implant-resected, multiple tumor deposits ? Sigmoid/descending resection and creation of a descending colostomy 04/10/2016 ? MSI-stable, no loss of mismatch repair protein expression ? Foundation 1- BRAF V600E positive, MS-stable, intermediate tumor mutation burden, no RAS mutation ? CT abdomen/pelvis 04/02/2016-no evidence of distant metastatic disease ? Cycle 1 FOLFOX 05/21/2016 ? Cycle 2 FOLFOX 06/04/2016 ? Cycle 3 FOLFOX 06/18/2016 ? Cycle 4 FOLFOX 07/02/2016 (oxaliplatin held  secondary to thrombocytopenia) ? Cycle 5 FOLFOX 07/16/2016 ? Cycle 6 FOLFOX 07/30/2017  2. Chronic renal insufficiency  3. Hypertension  4. Inflamed sebaceous cyst at the upper back-status post incision and drainage  5. Thrombocytopenia secondary chemotherapy-oxaliplatin dose reduced beginning with cycle 3 FOLFOX, oxaliplatin held with cycle 4 FOLFOX    Disposition:  He appears stable. The plan is to proceed with cycle 6 FOLFOX today. He will receive oxaliplatin with this cycle. He will return for an office visit and chemotherapy in 2 weeks.  15 minutes were spent with the patient today. The majority of the time was used for counseling and coordination of care.  Betsy Coder, MD  07/30/2016  9:29 AM

## 2016-07-30 NOTE — Progress Notes (Signed)
Per Dr. Benay Spice: OK to treat with creatinine 2.2. Maudie Mercury, Infusion RN made aware.

## 2016-07-30 NOTE — Patient Instructions (Signed)
Implanted Port Home Guide An implanted port is a type of central line that is placed under the skin. Central lines are used to provide IV access when treatment or nutrition needs to be given through a person's veins. Implanted ports are used for long-term IV access. An implanted port may be placed because:  You need IV medicine that would be irritating to the small veins in your hands or arms.  You need long-term IV medicines, such as antibiotics.  You need IV nutrition for a long period.  You need frequent blood draws for lab tests.  You need dialysis.  Implanted ports are usually placed in the chest area, but they can also be placed in the upper arm, the abdomen, or the leg. An implanted port has two main parts:  Reservoir. The reservoir is round and will appear as a small, raised area under your skin. The reservoir is the part where a needle is inserted to give medicines or draw blood.  Catheter. The catheter is a thin, flexible tube that extends from the reservoir. The catheter is placed into a large vein. Medicine that is inserted into the reservoir goes into the catheter and then into the vein.  How will I care for my incision site? Do not get the incision site wet. Bathe or shower as directed by your health care provider. How is my port accessed? Special steps must be taken to access the port:  Before the port is accessed, a numbing cream can be placed on the skin. This helps numb the skin over the port site.  Your health care provider uses a sterile technique to access the port. ? Your health care provider must put on a mask and sterile gloves. ? The skin over your port is cleaned carefully with an antiseptic and allowed to dry. ? The port is gently pinched between sterile gloves, and a needle is inserted into the port.  Only "non-coring" port needles should be used to access the port. Once the port is accessed, a blood return should be checked. This helps ensure that the port  is in the vein and is not clogged.  If your port needs to remain accessed for a constant infusion, a clear (transparent) bandage will be placed over the needle site. The bandage and needle will need to be changed every week, or as directed by your health care provider.  Keep the bandage covering the needle clean and dry. Do not get it wet. Follow your health care provider's instructions on how to take a shower or bath while the port is accessed.  If your port does not need to stay accessed, no bandage is needed over the port.  What is flushing? Flushing helps keep the port from getting clogged. Follow your health care provider's instructions on how and when to flush the port. Ports are usually flushed with saline solution or a medicine called heparin. The need for flushing will depend on how the port is used.  If the port is used for intermittent medicines or blood draws, the port will need to be flushed: ? After medicines have been given. ? After blood has been drawn. ? As part of routine maintenance.  If a constant infusion is running, the port may not need to be flushed.  How long will my port stay implanted? The port can stay in for as long as your health care provider thinks it is needed. When it is time for the port to come out, surgery will be   done to remove it. The procedure is similar to the one performed when the port was put in. When should I seek immediate medical care? When you have an implanted port, you should seek immediate medical care if:  You notice a bad smell coming from the incision site.  You have swelling, redness, or drainage at the incision site.  You have more swelling or pain at the port site or the surrounding area.  You have a fever that is not controlled with medicine.  This information is not intended to replace advice given to you by your health care provider. Make sure you discuss any questions you have with your health care provider. Document  Released: 04/21/2005 Document Revised: 09/27/2015 Document Reviewed: 12/27/2012 Elsevier Interactive Patient Education  2017 [employer] Inc.  

## 2016-08-01 ENCOUNTER — Ambulatory Visit (HOSPITAL_BASED_OUTPATIENT_CLINIC_OR_DEPARTMENT_OTHER): Payer: Medicare Other

## 2016-08-01 VITALS — BP 133/72 | HR 58 | Temp 98.3°F | Resp 17

## 2016-08-01 DIAGNOSIS — C187 Malignant neoplasm of sigmoid colon: Secondary | ICD-10-CM | POA: Diagnosis not present

## 2016-08-01 DIAGNOSIS — Z452 Encounter for adjustment and management of vascular access device: Secondary | ICD-10-CM | POA: Diagnosis not present

## 2016-08-01 DIAGNOSIS — C189 Malignant neoplasm of colon, unspecified: Secondary | ICD-10-CM

## 2016-08-01 MED ORDER — SODIUM CHLORIDE 0.9% FLUSH
10.0000 mL | INTRAVENOUS | Status: DC | PRN
  Administered 2016-08-01: 10 mL
  Filled 2016-08-01: qty 10

## 2016-08-01 MED ORDER — HEPARIN SOD (PORK) LOCK FLUSH 100 UNIT/ML IV SOLN
500.0000 [IU] | Freq: Once | INTRAVENOUS | Status: AC | PRN
  Administered 2016-08-01: 500 [IU]
  Filled 2016-08-01: qty 5

## 2016-08-01 NOTE — Patient Instructions (Signed)
Implanted Port Home Guide An implanted port is a type of central line that is placed under the skin. Central lines are used to provide IV access when treatment or nutrition needs to be given through a person's veins. Implanted ports are used for long-term IV access. An implanted port may be placed because:  You need IV medicine that would be irritating to the small veins in your hands or arms.  You need long-term IV medicines, such as antibiotics.  You need IV nutrition for a long period.  You need frequent blood draws for lab tests.  You need dialysis.  Implanted ports are usually placed in the chest area, but they can also be placed in the upper arm, the abdomen, or the leg. An implanted port has two main parts:  Reservoir. The reservoir is round and will appear as a small, raised area under your skin. The reservoir is the part where a needle is inserted to give medicines or draw blood.  Catheter. The catheter is a thin, flexible tube that extends from the reservoir. The catheter is placed into a large vein. Medicine that is inserted into the reservoir goes into the catheter and then into the vein.  How will I care for my incision site? Do not get the incision site wet. Bathe or shower as directed by your health care provider. How is my port accessed? Special steps must be taken to access the port:  Before the port is accessed, a numbing cream can be placed on the skin. This helps numb the skin over the port site.  Your health care provider uses a sterile technique to access the port. ? Your health care provider must put on a mask and sterile gloves. ? The skin over your port is cleaned carefully with an antiseptic and allowed to dry. ? The port is gently pinched between sterile gloves, and a needle is inserted into the port.  Only "non-coring" port needles should be used to access the port. Once the port is accessed, a blood return should be checked. This helps ensure that the port  is in the vein and is not clogged.  If your port needs to remain accessed for a constant infusion, a clear (transparent) bandage will be placed over the needle site. The bandage and needle will need to be changed every week, or as directed by your health care provider.  Keep the bandage covering the needle clean and dry. Do not get it wet. Follow your health care provider's instructions on how to take a shower or bath while the port is accessed.  If your port does not need to stay accessed, no bandage is needed over the port.  What is flushing? Flushing helps keep the port from getting clogged. Follow your health care provider's instructions on how and when to flush the port. Ports are usually flushed with saline solution or a medicine called heparin. The need for flushing will depend on how the port is used.  If the port is used for intermittent medicines or blood draws, the port will need to be flushed: ? After medicines have been given. ? After blood has been drawn. ? As part of routine maintenance.  If a constant infusion is running, the port may not need to be flushed.  How long will my port stay implanted? The port can stay in for as long as your health care provider thinks it is needed. When it is time for the port to come out, surgery will be   done to remove it. The procedure is similar to the one performed when the port was put in. When should I seek immediate medical care? When you have an implanted port, you should seek immediate medical care if:  You notice a bad smell coming from the incision site.  You have swelling, redness, or drainage at the incision site.  You have more swelling or pain at the port site or the surrounding area.  You have a fever that is not controlled with medicine.  This information is not intended to replace advice given to you by your health care provider. Make sure you discuss any questions you have with your health care provider. Document  Released: 04/21/2005 Document Revised: 09/27/2015 Document Reviewed: 12/27/2012 Elsevier Interactive Patient Education  2017 [employer] Inc.  

## 2016-08-04 ENCOUNTER — Telehealth: Payer: Self-pay | Admitting: Oncology

## 2016-08-04 NOTE — Telephone Encounter (Signed)
Called patient to inform him of next scheduled appointments. °

## 2016-08-09 ENCOUNTER — Other Ambulatory Visit: Payer: Self-pay | Admitting: Oncology

## 2016-08-13 ENCOUNTER — Ambulatory Visit (HOSPITAL_BASED_OUTPATIENT_CLINIC_OR_DEPARTMENT_OTHER): Payer: Medicare Other | Admitting: Oncology

## 2016-08-13 ENCOUNTER — Ambulatory Visit (HOSPITAL_BASED_OUTPATIENT_CLINIC_OR_DEPARTMENT_OTHER): Payer: Medicare Other

## 2016-08-13 ENCOUNTER — Other Ambulatory Visit (HOSPITAL_BASED_OUTPATIENT_CLINIC_OR_DEPARTMENT_OTHER): Payer: Medicare Other

## 2016-08-13 VITALS — BP 112/80 | HR 65 | Temp 98.4°F | Resp 18 | Ht 76.0 in | Wt 220.6 lb

## 2016-08-13 DIAGNOSIS — G620 Drug-induced polyneuropathy: Secondary | ICD-10-CM

## 2016-08-13 DIAGNOSIS — C187 Malignant neoplasm of sigmoid colon: Secondary | ICD-10-CM

## 2016-08-13 DIAGNOSIS — C189 Malignant neoplasm of colon, unspecified: Secondary | ICD-10-CM

## 2016-08-13 DIAGNOSIS — I10 Essential (primary) hypertension: Secondary | ICD-10-CM

## 2016-08-13 DIAGNOSIS — N189 Chronic kidney disease, unspecified: Secondary | ICD-10-CM

## 2016-08-13 DIAGNOSIS — D6959 Other secondary thrombocytopenia: Secondary | ICD-10-CM

## 2016-08-13 DIAGNOSIS — Z452 Encounter for adjustment and management of vascular access device: Secondary | ICD-10-CM | POA: Diagnosis not present

## 2016-08-13 DIAGNOSIS — K1231 Oral mucositis (ulcerative) due to antineoplastic therapy: Secondary | ICD-10-CM

## 2016-08-13 DIAGNOSIS — R232 Flushing: Secondary | ICD-10-CM

## 2016-08-13 DIAGNOSIS — Z5111 Encounter for antineoplastic chemotherapy: Secondary | ICD-10-CM

## 2016-08-13 LAB — CBC WITH DIFFERENTIAL/PLATELET
BASO%: 0.7 % (ref 0.0–2.0)
Basophils Absolute: 0 10*3/uL (ref 0.0–0.1)
EOS ABS: 0.1 10*3/uL (ref 0.0–0.5)
EOS%: 2.9 % (ref 0.0–7.0)
HEMATOCRIT: 34.5 % — AB (ref 38.4–49.9)
HGB: 11.8 g/dL — ABNORMAL LOW (ref 13.0–17.1)
LYMPH#: 1.4 10*3/uL (ref 0.9–3.3)
LYMPH%: 31.4 % (ref 14.0–49.0)
MCH: 29.7 pg (ref 27.2–33.4)
MCHC: 34.2 g/dL (ref 32.0–36.0)
MCV: 86.9 fL (ref 79.3–98.0)
MONO#: 0.9 10*3/uL (ref 0.1–0.9)
MONO%: 20.1 % — AB (ref 0.0–14.0)
NEUT#: 2 10*3/uL (ref 1.5–6.5)
NEUT%: 44.9 % (ref 39.0–75.0)
PLATELETS: 71 10*3/uL — AB (ref 140–400)
RBC: 3.97 10*6/uL — AB (ref 4.20–5.82)
RDW: 17.9 % — ABNORMAL HIGH (ref 11.0–14.6)
WBC: 4.4 10*3/uL (ref 4.0–10.3)
nRBC: 0 % (ref 0–0)

## 2016-08-13 LAB — COMPREHENSIVE METABOLIC PANEL
ALT: 29 U/L (ref 0–55)
ANION GAP: 11 meq/L (ref 3–11)
AST: 20 U/L (ref 5–34)
Albumin: 3.5 g/dL (ref 3.5–5.0)
Alkaline Phosphatase: 94 U/L (ref 40–150)
BILIRUBIN TOTAL: 0.66 mg/dL (ref 0.20–1.20)
BUN: 27.5 mg/dL — ABNORMAL HIGH (ref 7.0–26.0)
CO2: 20 meq/L — AB (ref 22–29)
CREATININE: 2.5 mg/dL — AB (ref 0.7–1.3)
Calcium: 9.3 mg/dL (ref 8.4–10.4)
Chloride: 111 mEq/L — ABNORMAL HIGH (ref 98–109)
EGFR: 25 mL/min/{1.73_m2} — ABNORMAL LOW (ref >90)
Glucose: 124 mg/dl (ref 70–140)
Potassium: 3.5 mEq/L (ref 3.5–5.1)
Sodium: 141 mEq/L (ref 136–145)
TOTAL PROTEIN: 6.8 g/dL (ref 6.4–8.3)

## 2016-08-13 MED ORDER — FLUOROURACIL CHEMO INJECTION 2.5 GM/50ML
300.0000 mg/m2 | Freq: Once | INTRAVENOUS | Status: AC
  Administered 2016-08-13: 700 mg via INTRAVENOUS
  Filled 2016-08-13: qty 14

## 2016-08-13 MED ORDER — SODIUM CHLORIDE 0.9 % IV SOLN
1800.0000 mg/m2 | INTRAVENOUS | Status: DC
  Administered 2016-08-13: 4200 mg via INTRAVENOUS
  Filled 2016-08-13: qty 84

## 2016-08-13 MED ORDER — LEUCOVORIN CALCIUM INJECTION 350 MG
300.0000 mg/m2 | Freq: Once | INTRAMUSCULAR | Status: AC
  Administered 2016-08-13: 702 mg via INTRAVENOUS
  Filled 2016-08-13: qty 35.1

## 2016-08-13 MED ORDER — SODIUM CHLORIDE 0.9% FLUSH
10.0000 mL | Freq: Once | INTRAVENOUS | Status: AC
  Administered 2016-08-13: 10 mL via INTRAVENOUS
  Filled 2016-08-13: qty 10

## 2016-08-13 MED ORDER — SODIUM CHLORIDE 0.9 % IV SOLN
Freq: Once | INTRAVENOUS | Status: AC
  Administered 2016-08-13: 10:00:00 via INTRAVENOUS

## 2016-08-13 NOTE — Progress Notes (Signed)
  [place] OFFICE PROGRESS NOTE   Diagnosis: Colon cancer  INTERVAL HISTORY:   [name] completed another cycle of FOLFOX on 07/31/2014. He had mouth sores following chemotherapy. These have resolved. Mild diarrhea for 2 days. He had more prolonged cold sensitivity following chemotherapy and now has a "slick "feeling in the fingertips. It is difficult to button his shirt. The cyst site at the upper back is healing.  Objective:  Vital signs in last 24 hours:  Blood pressure 112/80, pulse 65, temperature 98.4 F (36.9 C), temperature source Oral, resp. rate 18, height '6\' 4"'$  (1.93 m), weight 220 lb 9.6 oz (100.1 kg), SpO2 100 %.    HEENT: No thrush. Several healing ulcers at the posterior palate Resp: Lungs clear bilaterally Cardio: Regular rate and rhythm GI: No hepatosplenomegaly, left lower quadrant colostomy Vascular: No leg edema Neuro: Moderate loss of vibratory sense at the fingertips bilaterally  Skin: Palms without erythema, surgical site at the upper back appears healed with an overlying eschar   Portacath/PICC-without erythema  Lab Results:  Lab Results  Component Value Date   WBC 4.8 07/30/2016   HGB 11.8 (L) 07/30/2016   HCT 35.0 (L) 07/30/2016   MCV 87.9 07/30/2016   PLT 122 (L) 07/30/2016   NEUTROABS 2.4 07/30/2016    Medications: I have reviewed the patient's current medications.  Assessment/Plan: 1.Sigmoid colon cancer, stage IV (X7D,Z3G,D9M), isolated mesenteric implant-resected, multiple tumor deposits ? Sigmoid/descending resection and creation of a descending colostomy 04/10/2016 ? MSI-stable, no loss of mismatch repair protein expression ? Foundation 1- BRAF V600Epositive, MS-stable, intermediate tumor mutation burden, no RAS mutation ? CT abdomen/pelvis 04/02/2016-no evidence of distant metastatic disease ? Cycle 1 FOLFOX 05/21/2016 ? Cycle 2 FOLFOX 06/04/2016 ? Cycle 3 FOLFOX 06/18/2016 ? Cycle 4 FOLFOX 07/02/2016  (oxaliplatin held secondary to thrombocytopenia) ? Cycle 5 FOLFOX 07/16/2016 ? Cycle 6 FOLFOX 07/30/2016 ? Cycle 7 FOLFOX 08/13/2016 (sees oxaliplatin held and 5-FU dose reduced)  2. Chronic renal insufficiency  3. Hypertension  4. Inflamed sebaceous cyst at the upper back-status post incision and drainage  5. Thrombocytopenia secondary chemotherapy-oxaliplatin dose reduced beginning with cycle 3 FOLFOX, oxaliplatin held with cycle 4 and cycle 7 FOLFOX  6.  Oxaliplatin neuropathy  7.  Mucositis secondary chemotherapy   Disposition:  He has completed 6 cycles of adjuvant chemotherapy. The chemotherapy has been complicated by thrombocytopenia, neuropathy, and mucositis. Oxaliplatin will be held with chemotherapy today. We will dose reduce the 5-fluorouracil secondary to mucositis.  He will return for a CBC and chemistry panel on 08/22/2016. He plans to leave on a vacation 08/23/2016. He knows to contact us for bleeding symptoms. He is scheduled for the next cycle of chemotherapy and an office visit on 09/03/2016.  25 minutes were spent with the patient today. The majority of the time was used for counseling and coordination of care.  Betsy Coder, MD  08/13/2016  8:37 AM

## 2016-08-13 NOTE — Patient Instructions (Signed)
[facility] Discharge Instructions for Patients Receiving Chemotherapy  Today you received the following chemotherapy agents Leucovorin/5-FU  To help prevent nausea and vomiting after your treatment, we encourage you to take your nausea medication as directed.    If you develop nausea and vomiting that is not controlled by your nausea medication, call the clinic.   BELOW ARE SYMPTOMS THAT SHOULD BE REPORTED IMMEDIATELY:  *FEVER GREATER THAN 100.5 F  *CHILLS WITH OR WITHOUT FEVER  NAUSEA AND VOMITING THAT IS NOT CONTROLLED WITH YOUR NAUSEA MEDICATION  *UNUSUAL SHORTNESS OF BREATH  *UNUSUAL BRUISING OR BLEEDING  TENDERNESS IN MOUTH AND THROAT WITH OR WITHOUT PRESENCE OF ULCERS  *URINARY PROBLEMS  *BOWEL PROBLEMS  UNUSUAL RASH Items with * indicate a potential emergency and should be followed up as soon as possible.  Feel free to call the clinic you have any questions or concerns. The clinic phone number is [phone].  Please show the CHEMO ALERT CARD at check-in to the Emergency Department and triage nurse.   

## 2016-08-13 NOTE — Progress Notes (Signed)
OK to treat with PLT 71k, per Dr. Benay Spice. Oxaliplatin will be held and 5FU dose reduced. Infusion RN made aware.

## 2016-08-15 ENCOUNTER — Telehealth: Payer: Self-pay | Admitting: Oncology

## 2016-08-15 ENCOUNTER — Ambulatory Visit (HOSPITAL_BASED_OUTPATIENT_CLINIC_OR_DEPARTMENT_OTHER): Payer: Medicare Other

## 2016-08-15 VITALS — BP 142/86 | HR 71 | Temp 98.5°F | Resp 16

## 2016-08-15 DIAGNOSIS — C189 Malignant neoplasm of colon, unspecified: Secondary | ICD-10-CM

## 2016-08-15 DIAGNOSIS — C187 Malignant neoplasm of sigmoid colon: Secondary | ICD-10-CM | POA: Diagnosis not present

## 2016-08-15 MED ORDER — SODIUM CHLORIDE 0.9% FLUSH
10.0000 mL | INTRAVENOUS | Status: DC | PRN
  Administered 2016-08-15: 10 mL
  Filled 2016-08-15: qty 10

## 2016-08-15 MED ORDER — HEPARIN SOD (PORK) LOCK FLUSH 100 UNIT/ML IV SOLN
500.0000 [IU] | Freq: Once | INTRAVENOUS | Status: AC | PRN
  Administered 2016-08-15: 500 [IU]
  Filled 2016-08-15: qty 5

## 2016-08-15 NOTE — Telephone Encounter (Signed)
Patient bypassed scheduling. Appointments scheduled per 08/13/16 los. Appointments confirmed with patient,

## 2016-08-19 ENCOUNTER — Ambulatory Visit: Payer: Medicare Other

## 2016-08-21 DIAGNOSIS — E669 Obesity, unspecified: Secondary | ICD-10-CM | POA: Diagnosis not present

## 2016-08-21 DIAGNOSIS — N2581 Secondary hyperparathyroidism of renal origin: Secondary | ICD-10-CM | POA: Diagnosis not present

## 2016-08-21 DIAGNOSIS — E785 Hyperlipidemia, unspecified: Secondary | ICD-10-CM | POA: Diagnosis not present

## 2016-08-21 DIAGNOSIS — C187 Malignant neoplasm of sigmoid colon: Secondary | ICD-10-CM | POA: Diagnosis not present

## 2016-08-21 DIAGNOSIS — N183 Chronic kidney disease, stage 3 (moderate): Secondary | ICD-10-CM | POA: Diagnosis not present

## 2016-08-21 DIAGNOSIS — I10 Essential (primary) hypertension: Secondary | ICD-10-CM | POA: Diagnosis not present

## 2016-08-21 DIAGNOSIS — N179 Acute kidney failure, unspecified: Secondary | ICD-10-CM | POA: Diagnosis not present

## 2016-08-22 ENCOUNTER — Encounter: Payer: Self-pay | Admitting: *Deleted

## 2016-08-22 ENCOUNTER — Other Ambulatory Visit (HOSPITAL_BASED_OUTPATIENT_CLINIC_OR_DEPARTMENT_OTHER): Payer: Medicare Other

## 2016-08-22 DIAGNOSIS — C187 Malignant neoplasm of sigmoid colon: Secondary | ICD-10-CM

## 2016-08-22 DIAGNOSIS — C189 Malignant neoplasm of colon, unspecified: Secondary | ICD-10-CM

## 2016-08-22 LAB — BASIC METABOLIC PANEL
ANION GAP: 12 meq/L — AB (ref 3–11)
BUN: 27.5 mg/dL — AB (ref 7.0–26.0)
CALCIUM: 9.6 mg/dL (ref 8.4–10.4)
CHLORIDE: 110 meq/L — AB (ref 98–109)
CO2: 22 mEq/L (ref 22–29)
CREATININE: 2.4 mg/dL — AB (ref 0.7–1.3)
EGFR: 27 mL/min/{1.73_m2} — ABNORMAL LOW (ref >90)
Glucose: 110 mg/dl (ref 70–140)
Potassium: 3.4 mEq/L — ABNORMAL LOW (ref 3.5–5.1)
Sodium: 144 mEq/L (ref 136–145)

## 2016-08-22 LAB — CBC WITH DIFFERENTIAL/PLATELET
BASO%: 0.7 % (ref 0.0–2.0)
Basophils Absolute: 0 10*3/uL (ref 0.0–0.1)
EOS ABS: 0.3 10*3/uL (ref 0.0–0.5)
EOS%: 4.1 % (ref 0.0–7.0)
HCT: 36.5 % — ABNORMAL LOW (ref 38.4–49.9)
HGB: 12.2 g/dL — ABNORMAL LOW (ref 13.0–17.1)
LYMPH%: 19.9 % (ref 14.0–49.0)
MCH: 29.8 pg (ref 27.2–33.4)
MCHC: 33.4 g/dL (ref 32.0–36.0)
MCV: 89.2 fL (ref 79.3–98.0)
MONO#: 0.6 10*3/uL (ref 0.1–0.9)
MONO%: 9.2 % (ref 0.0–14.0)
NEUT%: 66.1 % (ref 39.0–75.0)
NEUTROS ABS: 4.5 10*3/uL (ref 1.5–6.5)
PLATELETS: 127 10*3/uL — AB (ref 140–400)
RBC: 4.1 10*6/uL — AB (ref 4.20–5.82)
RDW: 18.8 % — ABNORMAL HIGH (ref 11.0–14.6)
WBC: 6.9 10*3/uL (ref 4.0–10.3)
lymph#: 1.4 10*3/uL (ref 0.9–3.3)

## 2016-08-22 NOTE — Progress Notes (Signed)
Pt here for lab appt and is requesting to speak with Dr. Gearldine Shown nurse.  Patient is leaving for a trip to [place] tomorrow morning and pt.'s wife is concerned that patient is dehydrated.  Patient denies any nausea, vomiting, pain, SOB or fever.  He states that he is eating without difficulty and is not able to give an exact amount of how much water he is drinking a day.  States that it is "not much."  Patient instructed to drink 64 oz of water a day and verbalizes an understanding to do so. His BP is currently 164/85 with a HR of 78.  He denies any lightheadedness or dizziness. Skin turgor WNL.  Informed patient that he does not need fluids at this time and to push water as tolerated.  Teach back done.  Patient appreciative of assistance and has no further questions at this time.

## 2016-09-03 ENCOUNTER — Ambulatory Visit (HOSPITAL_BASED_OUTPATIENT_CLINIC_OR_DEPARTMENT_OTHER): Payer: Medicare Other

## 2016-09-03 ENCOUNTER — Ambulatory Visit (HOSPITAL_BASED_OUTPATIENT_CLINIC_OR_DEPARTMENT_OTHER): Payer: Medicare Other | Admitting: Oncology

## 2016-09-03 ENCOUNTER — Other Ambulatory Visit (HOSPITAL_BASED_OUTPATIENT_CLINIC_OR_DEPARTMENT_OTHER): Payer: Medicare Other

## 2016-09-03 VITALS — BP 148/79 | HR 71 | Temp 97.8°F | Resp 18 | Wt 229.2 lb

## 2016-09-03 DIAGNOSIS — G620 Drug-induced polyneuropathy: Secondary | ICD-10-CM

## 2016-09-03 DIAGNOSIS — D6959 Other secondary thrombocytopenia: Secondary | ICD-10-CM

## 2016-09-03 DIAGNOSIS — Z95828 Presence of other vascular implants and grafts: Secondary | ICD-10-CM

## 2016-09-03 DIAGNOSIS — C187 Malignant neoplasm of sigmoid colon: Secondary | ICD-10-CM

## 2016-09-03 DIAGNOSIS — N189 Chronic kidney disease, unspecified: Secondary | ICD-10-CM | POA: Diagnosis not present

## 2016-09-03 DIAGNOSIS — Z452 Encounter for adjustment and management of vascular access device: Secondary | ICD-10-CM

## 2016-09-03 DIAGNOSIS — I10 Essential (primary) hypertension: Secondary | ICD-10-CM | POA: Diagnosis not present

## 2016-09-03 DIAGNOSIS — C189 Malignant neoplasm of colon, unspecified: Secondary | ICD-10-CM

## 2016-09-03 DIAGNOSIS — Z5111 Encounter for antineoplastic chemotherapy: Secondary | ICD-10-CM | POA: Diagnosis present

## 2016-09-03 LAB — COMPREHENSIVE METABOLIC PANEL
ALT: 22 U/L (ref 0–55)
AST: 16 U/L (ref 5–34)
Albumin: 3.5 g/dL (ref 3.5–5.0)
Alkaline Phosphatase: 100 U/L (ref 40–150)
Anion Gap: 10 mEq/L (ref 3–11)
BUN: 27 mg/dL — AB (ref 7.0–26.0)
CALCIUM: 9.3 mg/dL (ref 8.4–10.4)
CO2: 23 mEq/L (ref 22–29)
CREATININE: 2.4 mg/dL — AB (ref 0.7–1.3)
Chloride: 109 mEq/L (ref 98–109)
EGFR: 27 mL/min/{1.73_m2} — ABNORMAL LOW (ref >90)
GLUCOSE: 106 mg/dL (ref 70–140)
Potassium: 3.9 mEq/L (ref 3.5–5.1)
SODIUM: 143 meq/L (ref 136–145)
Total Bilirubin: 0.37 mg/dL (ref 0.20–1.20)
Total Protein: 6.9 g/dL (ref 6.4–8.3)

## 2016-09-03 LAB — CBC WITH DIFFERENTIAL/PLATELET
BASO%: 0.6 % (ref 0.0–2.0)
Basophils Absolute: 0 10*3/uL (ref 0.0–0.1)
EOS%: 4.8 % (ref 0.0–7.0)
Eosinophils Absolute: 0.2 10*3/uL (ref 0.0–0.5)
HEMATOCRIT: 35.5 % — AB (ref 38.4–49.9)
HEMOGLOBIN: 11.6 g/dL — AB (ref 13.0–17.1)
LYMPH#: 1.4 10*3/uL (ref 0.9–3.3)
LYMPH%: 30.7 % (ref 14.0–49.0)
MCH: 29.6 pg (ref 27.2–33.4)
MCHC: 32.7 g/dL (ref 32.0–36.0)
MCV: 90.6 fL (ref 79.3–98.0)
MONO#: 0.5 10*3/uL (ref 0.1–0.9)
MONO%: 11.7 % (ref 0.0–14.0)
NEUT#: 2.4 10*3/uL (ref 1.5–6.5)
NEUT%: 52.2 % (ref 39.0–75.0)
Platelets: 138 10*3/uL — ABNORMAL LOW (ref 140–400)
RBC: 3.92 10*6/uL — ABNORMAL LOW (ref 4.20–5.82)
RDW: 18.4 % — AB (ref 11.0–14.6)
WBC: 4.6 10*3/uL (ref 4.0–10.3)

## 2016-09-03 MED ORDER — LEUCOVORIN CALCIUM INJECTION 350 MG
300.0000 mg/m2 | Freq: Once | INTRAMUSCULAR | Status: AC
  Administered 2016-09-03: 702 mg via INTRAVENOUS
  Filled 2016-09-03: qty 35.1

## 2016-09-03 MED ORDER — FLUOROURACIL CHEMO INJECTION 2.5 GM/50ML
300.0000 mg/m2 | Freq: Once | INTRAVENOUS | Status: AC
  Administered 2016-09-03: 700 mg via INTRAVENOUS
  Filled 2016-09-03: qty 14

## 2016-09-03 MED ORDER — DEXTROSE 5 % IV SOLN
Freq: Once | INTRAVENOUS | Status: AC
  Administered 2016-09-03: 10:00:00 via INTRAVENOUS

## 2016-09-03 MED ORDER — SODIUM CHLORIDE 0.9 % IV SOLN
1800.0000 mg/m2 | INTRAVENOUS | Status: DC
  Administered 2016-09-03: 4200 mg via INTRAVENOUS
  Filled 2016-09-03: qty 84

## 2016-09-03 MED ORDER — SODIUM CHLORIDE 0.9% FLUSH
10.0000 mL | INTRAVENOUS | Status: DC | PRN
  Administered 2016-09-03: 10 mL via INTRAVENOUS
  Filled 2016-09-03: qty 10

## 2016-09-03 NOTE — Progress Notes (Signed)
OK to treat with creatinine of 2.4 per Dr. Benay Spice.

## 2016-09-03 NOTE — Addendum Note (Signed)
Addended by: Betsy Coder B on: 09/03/2016 10:37 AM   Modules accepted: Orders

## 2016-09-03 NOTE — Patient Instructions (Signed)
[facility] Discharge Instructions for Patients Receiving Chemotherapy  Today you received the following: Leucovorin and Adrucil.   To help prevent nausea and vomiting after your treatment, we encourage you to take your nausea medication as prescribed.    If you develop nausea and vomiting that is not controlled by your nausea medication, call the clinic.   BELOW ARE SYMPTOMS THAT SHOULD BE REPORTED IMMEDIATELY:  *FEVER GREATER THAN 100.5 F  *CHILLS WITH OR WITHOUT FEVER  NAUSEA AND VOMITING THAT IS NOT CONTROLLED WITH YOUR NAUSEA MEDICATION  *UNUSUAL SHORTNESS OF BREATH  *UNUSUAL BRUISING OR BLEEDING  TENDERNESS IN MOUTH AND THROAT WITH OR WITHOUT PRESENCE OF ULCERS  *URINARY PROBLEMS  *BOWEL PROBLEMS  UNUSUAL RASH Items with * indicate a potential emergency and should be followed up as soon as possible.  Feel free to call the clinic you have any questions or concerns. The clinic phone number is [phone].  Please show the [facility] at check-in to the Emergency Department and triage nurse.

## 2016-09-03 NOTE — Progress Notes (Signed)
Okay to proceed with treatment today with creatinine 2.4 per Dr. Benay Spice.

## 2016-09-03 NOTE — Progress Notes (Signed)
  [name] OFFICE PROGRESS NOTE   Diagnosis: Colon cancer  INTERVAL HISTORY:   He returns as scheduled. He completed another cycle of chemotherapy 08/13/2016. Oxaliplatin was held with this cycle of chemotherapy. He has persistent numbness in the fingers. This makes it difficult to button his shirt. He took a trip to [place] over the past few weeks. The colostomy is functioning well. The surgical site at the right upper back has healed. No new complaint.  Objective:  Vital signs in last 24 hours:  Blood pressure (!) 148/79, pulse 71, temperature 97.8 F (36.6 C), temperature source Oral, resp. rate 18, weight 229 lb 3.2 oz (104 kg), SpO2 99 %.    HEENT: No thrush or ulcers Resp: Lungs clear bilaterally Cardio: Regular rate and rhythm with premature beats GI: No hepatosplenomegaly, left lower quadrant colostomy Vascular: No leg edema Neuro: Moderate loss of vibratory sense at the fingertips bilaterally  Skin: Healed cyst drainage site at the right upper back   Portacath/PICC-without erythema  Lab Results:  Lab Results  Component Value Date   WBC 4.6 09/03/2016   HGB 11.6 (L) 09/03/2016   HCT 35.5 (L) 09/03/2016   MCV 90.6 09/03/2016   PLT 138 (L) 09/03/2016   NEUTROABS 2.4 09/03/2016    CMP     Component Value Date/Time   NA 143 09/03/2016 0904   K 3.9 09/03/2016 0904   CL 108 05/14/2016 1155   CO2 23 09/03/2016 0904   GLUCOSE 106 09/03/2016 0904   BUN 27.0 (H) 09/03/2016 0904   CREATININE 2.4 (H) 09/03/2016 0904   CALCIUM 9.3 09/03/2016 0904   PROT 6.9 09/03/2016 0904   ALBUMIN 3.5 09/03/2016 0904   AST 16 09/03/2016 0904   ALT 22 09/03/2016 0904   ALKPHOS 100 09/03/2016 0904   BILITOT 0.37 09/03/2016 0904   GFRNONAA 27 (L) 05/14/2016 1155   GFRAA 31 (L) 05/14/2016 1155     Medications: I have reviewed the patient's current medications.  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 Sigmoid/descending resection and creation of a descending colostomy 04/10/2016 ? MSI-stable, no loss of mismatch repair protein expression ? Foundation 1-BRAF V600Epositive, MS-stable, intermediate tumor mutation burden, no RAS mutation ? CT abdomen/pelvis 04/02/2016-no evidence of distant metastatic disease ? Cycle 1 FOLFOX 05/21/2016 ? Cycle 2 FOLFOX 06/04/2016 ? Cycle 3 FOLFOX 06/18/2016 ? Cycle 4 FOLFOX 07/02/2016 (oxaliplatin held secondary to thrombocytopenia) ? Cycle 5 FOLFOX 07/16/2016 ? Cycle 6 FOLFOX 07/30/2016 ? Cycle 7 FOLFOX 08/13/2016 (oxaliplatin held and 5-FU dose reduced) ? Cycle 8 FOLFOX 09/03/2016 (oxaliplatin held secondary to neuropathy)  2. Chronic renal insufficiency  3. Hypertension  4. Inflamed sebaceous cyst at the upper back-status post incision and drainage  5. Thrombocytopenia secondary chemotherapy-oxaliplatin dose reduced beginning with cycle 3 FOLFOX, oxaliplatin held with cycle 4 and cycle 7 FOLFOX  6.  Oxaliplatin neuropathy  7.  Mucositis secondary chemotherapy-resolved    Disposition:  Mr. Budai tolerated the last cycle of chemotherapy well. He has persistent neuropathy symptoms. We decided to hold oxaliplatin from the chemotherapy regimen again today. He will return for an office visit and chemotherapy in 2 weeks. The mucositis improved with a dose reduction of the 5-fluorouracil.  He has stable renal insufficiency.    Betsy Coder, MD  09/03/2016  10:07 AM

## 2016-09-05 ENCOUNTER — Ambulatory Visit (HOSPITAL_BASED_OUTPATIENT_CLINIC_OR_DEPARTMENT_OTHER): Payer: Medicare Other

## 2016-09-05 VITALS — BP 141/76 | HR 64 | Temp 97.9°F | Resp 20

## 2016-09-05 DIAGNOSIS — Z452 Encounter for adjustment and management of vascular access device: Secondary | ICD-10-CM

## 2016-09-05 DIAGNOSIS — C187 Malignant neoplasm of sigmoid colon: Secondary | ICD-10-CM | POA: Diagnosis not present

## 2016-09-05 DIAGNOSIS — C189 Malignant neoplasm of colon, unspecified: Secondary | ICD-10-CM

## 2016-09-05 MED ORDER — SODIUM CHLORIDE 0.9% FLUSH
10.0000 mL | INTRAVENOUS | Status: DC | PRN
  Administered 2016-09-05: 10 mL
  Filled 2016-09-05: qty 10

## 2016-09-05 MED ORDER — HEPARIN SOD (PORK) LOCK FLUSH 100 UNIT/ML IV SOLN
500.0000 [IU] | Freq: Once | INTRAVENOUS | Status: AC | PRN
  Administered 2016-09-05: 500 [IU]
  Filled 2016-09-05: qty 5

## 2016-09-14 ENCOUNTER — Other Ambulatory Visit: Payer: Self-pay | Admitting: Oncology

## 2016-09-17 ENCOUNTER — Telehealth: Payer: Self-pay | Admitting: Oncology

## 2016-09-17 ENCOUNTER — Ambulatory Visit (HOSPITAL_BASED_OUTPATIENT_CLINIC_OR_DEPARTMENT_OTHER): Payer: Medicare Other

## 2016-09-17 ENCOUNTER — Other Ambulatory Visit (HOSPITAL_BASED_OUTPATIENT_CLINIC_OR_DEPARTMENT_OTHER): Payer: Medicare Other

## 2016-09-17 ENCOUNTER — Ambulatory Visit (HOSPITAL_BASED_OUTPATIENT_CLINIC_OR_DEPARTMENT_OTHER): Payer: Medicare Other | Admitting: Oncology

## 2016-09-17 VITALS — BP 137/84 | HR 64 | Temp 98.6°F | Resp 18 | Ht 76.0 in | Wt 230.5 lb

## 2016-09-17 DIAGNOSIS — Z5111 Encounter for antineoplastic chemotherapy: Secondary | ICD-10-CM | POA: Diagnosis present

## 2016-09-17 DIAGNOSIS — G620 Drug-induced polyneuropathy: Secondary | ICD-10-CM | POA: Diagnosis not present

## 2016-09-17 DIAGNOSIS — C187 Malignant neoplasm of sigmoid colon: Secondary | ICD-10-CM

## 2016-09-17 DIAGNOSIS — N183 Chronic kidney disease, stage 3 (moderate): Secondary | ICD-10-CM | POA: Diagnosis not present

## 2016-09-17 DIAGNOSIS — K1231 Oral mucositis (ulcerative) due to antineoplastic therapy: Secondary | ICD-10-CM

## 2016-09-17 DIAGNOSIS — Z452 Encounter for adjustment and management of vascular access device: Secondary | ICD-10-CM | POA: Diagnosis not present

## 2016-09-17 DIAGNOSIS — C189 Malignant neoplasm of colon, unspecified: Secondary | ICD-10-CM

## 2016-09-17 DIAGNOSIS — I10 Essential (primary) hypertension: Secondary | ICD-10-CM

## 2016-09-17 DIAGNOSIS — D6959 Other secondary thrombocytopenia: Secondary | ICD-10-CM

## 2016-09-17 DIAGNOSIS — Z95828 Presence of other vascular implants and grafts: Secondary | ICD-10-CM

## 2016-09-17 LAB — COMPREHENSIVE METABOLIC PANEL
ALT: 18 U/L (ref 0–55)
ANION GAP: 7 meq/L (ref 3–11)
AST: 14 U/L (ref 5–34)
Albumin: 3.6 g/dL (ref 3.5–5.0)
Alkaline Phosphatase: 91 U/L (ref 40–150)
BUN: 30.1 mg/dL — ABNORMAL HIGH (ref 7.0–26.0)
CHLORIDE: 110 meq/L — AB (ref 98–109)
CO2: 24 mEq/L (ref 22–29)
CREATININE: 2.3 mg/dL — AB (ref 0.7–1.3)
Calcium: 9.3 mg/dL (ref 8.4–10.4)
EGFR: 27 mL/min/{1.73_m2} — AB (ref >90)
GLUCOSE: 111 mg/dL (ref 70–140)
Potassium: 4 mEq/L (ref 3.5–5.1)
SODIUM: 141 meq/L (ref 136–145)
Total Bilirubin: 0.44 mg/dL (ref 0.20–1.20)
Total Protein: 7 g/dL (ref 6.4–8.3)

## 2016-09-17 LAB — CBC WITH DIFFERENTIAL/PLATELET
BASO%: 0.8 % (ref 0.0–2.0)
Basophils Absolute: 0 10*3/uL (ref 0.0–0.1)
EOS%: 5.3 % (ref 0.0–7.0)
Eosinophils Absolute: 0.2 10*3/uL (ref 0.0–0.5)
HEMATOCRIT: 35.8 % — AB (ref 38.4–49.9)
HEMOGLOBIN: 12 g/dL — AB (ref 13.0–17.1)
LYMPH#: 1.1 10*3/uL (ref 0.9–3.3)
LYMPH%: 26.1 % (ref 14.0–49.0)
MCH: 30.8 pg (ref 27.2–33.4)
MCHC: 33.6 g/dL (ref 32.0–36.0)
MCV: 91.7 fL (ref 79.3–98.0)
MONO#: 0.6 10*3/uL (ref 0.1–0.9)
MONO%: 14.7 % — ABNORMAL HIGH (ref 0.0–14.0)
NEUT%: 53.1 % (ref 39.0–75.0)
NEUTROS ABS: 2.3 10*3/uL (ref 1.5–6.5)
PLATELETS: 133 10*3/uL — AB (ref 140–400)
RBC: 3.9 10*6/uL — ABNORMAL LOW (ref 4.20–5.82)
RDW: 19.5 % — ABNORMAL HIGH (ref 11.0–14.6)
WBC: 4.3 10*3/uL (ref 4.0–10.3)

## 2016-09-17 MED ORDER — DEXTROSE 5 % IV SOLN: Freq: Once | INTRAVENOUS | Status: DC

## 2016-09-17 MED ORDER — SODIUM CHLORIDE 0.9 % IV SOLN
1800.0000 mg/m2 | INTRAVENOUS | Status: DC
  Administered 2016-09-17: 4200 mg via INTRAVENOUS
  Filled 2016-09-17: qty 84

## 2016-09-17 MED ORDER — SODIUM CHLORIDE 0.9% FLUSH
10.0000 mL | INTRAVENOUS | Status: DC | PRN
  Administered 2016-09-17: 10 mL via INTRAVENOUS
  Filled 2016-09-17: qty 10

## 2016-09-17 MED ORDER — FLUOROURACIL CHEMO INJECTION 2.5 GM/50ML
300.0000 mg/m2 | Freq: Once | INTRAVENOUS | Status: AC
  Administered 2016-09-17: 700 mg via INTRAVENOUS
  Filled 2016-09-17: qty 14

## 2016-09-17 MED ORDER — DEXTROSE 5 % IV SOLN
299.0000 mg/m2 | Freq: Once | INTRAVENOUS | Status: AC
  Administered 2016-09-17: 700 mg via INTRAVENOUS
  Filled 2016-09-17: qty 35

## 2016-09-17 MED ORDER — SODIUM CHLORIDE 0.9 % IV SOLN
INTRAVENOUS | Status: DC
  Administered 2016-09-17: 10:00:00 via INTRAVENOUS

## 2016-09-17 NOTE — Patient Instructions (Signed)

## 2016-09-17 NOTE — Telephone Encounter (Signed)
Appointments scheduled per 09/17/16 los. Patient was given a copy of the AVS report and appointment schedule per 09/17/16 los. °

## 2016-09-17 NOTE — Progress Notes (Signed)
[facility] OFFICE PROGRESS NOTE   Diagnosis: Colon cancer  INTERVAL HISTORY:   [name] returns as scheduled. He completed another cycle of 5 fluorouracil beginning 09/03/2016. He had one episode of diarrhea following chemotherapy. He reports developing sores over the tongue. This did not interfere with eating. He has persistent numbness in the fingers. It is difficult to button his shirt and returned the page in the paper. No numbness in the feet.  Objective:  Vital signs in last 24 hours:  Blood pressure 137/84, pulse 64, temperature 98.6 F (37 C), temperature source Oral, resp. rate 18, height '6\' 4"'  (1.93 m), weight 230 lb 8 oz (104.6 kg), SpO2 99 %.    HEENT: No thrush or ulcers Resp: Lungs clear bilaterally Cardio: Regular rate and rhythm GI: No hepatosplenomegaly, nontender, left lower quadrant colostomy Vascular: No leg edema Neuro: Moderate loss of vibratory sense at the fingertips bilaterally  Skin: Healed cyst drainage site at the right upper back, Palms without erythema   Portacath/PICC-without erythema  Lab Results:  Lab Results  Component Value Date   WBC 4.3 09/17/2016   HGB 12.0 (L) 09/17/2016   HCT 35.8 (L) 09/17/2016   MCV 91.7 09/17/2016   PLT 133 (L) 09/17/2016   NEUTROABS 2.3 09/17/2016    CMP     Component Value Date/Time   NA 143 09/03/2016 0904   K 3.9 09/03/2016 0904   CL 108 05/14/2016 1155   CO2 23 09/03/2016 0904   GLUCOSE 106 09/03/2016 0904   BUN 27.0 (H) 09/03/2016 0904   CREATININE 2.4 (H) 09/03/2016 0904   CALCIUM 9.3 09/03/2016 0904   PROT 6.9 09/03/2016 0904   ALBUMIN 3.5 09/03/2016 0904   AST 16 09/03/2016 0904   ALT 22 09/03/2016 0904   ALKPHOS 100 09/03/2016 0904   BILITOT 0.37 09/03/2016 0904   GFRNONAA 27 (L) 05/14/2016 1155   GFRAA 31 (L) 05/14/2016 1155    Lab Results  Component Value Date   CEA1 1.53 07/16/2016     Medications: I have reviewed the patient's current  medications.  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 Sigmoid/descending resection and creation of a descending colostomy 04/10/2016 ? MSI-stable, no loss of mismatch repair protein expression ? Foundation 1-BRAF V600Epositive, MS-stable, intermediate tumor mutation burden, no RAS mutation ? CT abdomen/pelvis 04/02/2016-no evidence of distant metastatic disease ? Cycle 1 FOLFOX 05/21/2016 ? Cycle 2 FOLFOX 06/04/2016 ? Cycle 3 FOLFOX 06/18/2016 ? Cycle 4 FOLFOX 07/02/2016 (oxaliplatin held secondary to thrombocytopenia) ? Cycle 5 FOLFOX 07/16/2016 ? Cycle 6 FOLFOX 07/30/2016 ? Cycle 7 FOLFOX 08/13/2016 (oxaliplatin held and 5-FU dose reduced) ? Cycle 8 FOLFOX 09/03/2016 (oxaliplatin held secondary to neuropathy) ? Cycle 9 FOLFOX 09/17/2016 (oxaliplatin held secondary to neuropathy)  2. Chronic renal insufficiency  3. Hypertension  4. Inflamed sebaceous cyst at the upper back-status post incision and drainage  5. Thrombocytopenia secondary chemotherapy-oxaliplatin dose reduced beginning with cycle 3 FOLFOX, oxaliplatin held with cycle 4 and cycle 7 FOLFOX  6. Oxaliplatin neuropathy  7. Mucositis secondary chemotherapy   Disposition:  His overall status appears unchanged. He has persistent neuropathy symptoms secondary to oxaliplatin. Oxaliplatin will remain on hold. The mucositis is secondary to 5-fluorouracil. He will use Orajel as needed. He declined a prescription for Magic mouthwash. He will contact us if the mouth ulcers are worse with this cycle. The 5-FU was dose reduced beginning with cycle 7.  [name] will return for an office visit  and chemotherapy in 2 weeks.  25 minutes were spent with the patient today. The majority of the time was used for counseling and coordination of care.  Betsy Coder, MD  09/17/2016  9:18 AM

## 2016-09-17 NOTE — Progress Notes (Signed)
Oxaliplatin and pre-meds held for this cycle. OK to treat with creatinine 2.3, per Dr. Benay Spice. Hassan Rowan, Infusion RN made aware.

## 2016-09-17 NOTE — Patient Instructions (Signed)
[facility] Discharge Instructions for Patients Receiving Chemotherapy  Today you received the following: Leucovorin and Adrucil.   To help prevent nausea and vomiting after your treatment, we encourage you to take your nausea medication as prescribed.    If you develop nausea and vomiting that is not controlled by your nausea medication, call the clinic.   BELOW ARE SYMPTOMS THAT SHOULD BE REPORTED IMMEDIATELY:  *FEVER GREATER THAN 100.5 F  *CHILLS WITH OR WITHOUT FEVER  NAUSEA AND VOMITING THAT IS NOT CONTROLLED WITH YOUR NAUSEA MEDICATION  *UNUSUAL SHORTNESS OF BREATH  *UNUSUAL BRUISING OR BLEEDING  TENDERNESS IN MOUTH AND THROAT WITH OR WITHOUT PRESENCE OF ULCERS  *URINARY PROBLEMS  *BOWEL PROBLEMS  UNUSUAL RASH Items with * indicate a potential emergency and should be followed up as soon as possible.  Feel free to call the clinic you have any questions or concerns. The clinic phone number is [phone].  Please show the [facility] at check-in to the Emergency Department and triage nurse.

## 2016-09-19 ENCOUNTER — Ambulatory Visit (HOSPITAL_BASED_OUTPATIENT_CLINIC_OR_DEPARTMENT_OTHER): Payer: Medicare Other

## 2016-09-19 VITALS — BP 127/90 | HR 77 | Temp 96.8°F | Resp 18

## 2016-09-19 DIAGNOSIS — C189 Malignant neoplasm of colon, unspecified: Secondary | ICD-10-CM

## 2016-09-19 DIAGNOSIS — C187 Malignant neoplasm of sigmoid colon: Secondary | ICD-10-CM

## 2016-09-19 MED ORDER — SODIUM CHLORIDE 0.9% FLUSH
10.0000 mL | INTRAVENOUS | Status: DC | PRN
  Administered 2016-09-19: 10 mL
  Filled 2016-09-19: qty 10

## 2016-09-19 MED ORDER — HEPARIN SOD (PORK) LOCK FLUSH 100 UNIT/ML IV SOLN
500.0000 [IU] | Freq: Once | INTRAVENOUS | Status: AC | PRN
  Administered 2016-09-19: 500 [IU]
  Filled 2016-09-19: qty 5

## 2016-09-19 NOTE — Patient Instructions (Signed)
Implanted Port Insertion, Care After °This sheet gives you information about how to care for yourself after your procedure. Your health care provider may also give you more specific instructions. If you have problems or questions, contact your health care provider. °What can I expect after the procedure? °After your procedure, it is common to have: °· Discomfort at the port insertion site. °· Bruising on the skin over the port. This should improve over 3-4 days. ° °Follow these instructions at home: °Port care °· After your port is placed, you will get a manufacturer's information card. The card has information about your port. Keep this card with you at all times. °· Take care of the port as told by your health care provider. Ask your health care provider if you or a family member can get training for taking care of the port at home. A home health care nurse may also take care of the port. °· Make sure to remember what type of port you have. °Incision care °· Follow instructions from your health care provider about how to take care of your port insertion site. Make sure you: °? Wash your hands with soap and water before you change your bandage (dressing). If soap and water are not available, use hand sanitizer. °? Change your dressing as told by your health care provider. °? Leave stitches (sutures), skin glue, or adhesive strips in place. These skin closures may need to stay in place for 2 weeks or longer. If adhesive strip edges start to loosen and curl up, you may trim the loose edges. Do not remove adhesive strips completely unless your health care provider tells you to do that. °· Check your port insertion site every day for signs of infection. Check for: °? More redness, swelling, or pain. °? More fluid or blood. °? Warmth. °? Pus or a bad smell. °General instructions °· Do not take baths, swim, or use a hot tub until your health care provider approves. °· Do not lift anything that is heavier than 10 lb (4.5  kg) for a week, or as told by your health care provider. °· Ask your health care provider when it is okay to: °? Return to work or school. °? Resume usual physical activities or sports. °· Do not drive for 24 hours if you were given a medicine to help you relax (sedative). °· Take over-the-counter and prescription medicines only as told by your health care provider. °· Wear a medical alert bracelet in case of an emergency. This will tell any health care providers that you have a port. °· Keep all follow-up visits as told by your health care provider. This is important. °Contact a health care provider if: °· You cannot flush your port with saline as directed, or you cannot draw blood from the port. °· You have a fever or chills. °· You have more redness, swelling, or pain around your port insertion site. °· You have more fluid or blood coming from your port insertion site. °· Your port insertion site feels warm to the touch. °· You have pus or a bad smell coming from the port insertion site. °Get help right away if: °· You have chest pain or shortness of breath. °· You have bleeding from your port that you cannot control. °Summary °· Take care of the port as told by your health care provider. °· Change your dressing as told by your health care provider. °· Keep all follow-up visits as told by your health care provider. °  This information is not intended to replace advice given to you by your health care provider. Make sure you discuss any questions you have with your health care provider. °Document Released: 02/09/2013 Document Revised: 03/12/2016 Document Reviewed: 03/12/2016 °Elsevier Interactive Patient Education © 2017 [employer] ° °

## 2016-09-29 ENCOUNTER — Other Ambulatory Visit: Payer: Self-pay | Admitting: Oncology

## 2016-10-02 ENCOUNTER — Ambulatory Visit (HOSPITAL_BASED_OUTPATIENT_CLINIC_OR_DEPARTMENT_OTHER): Payer: Medicare Other

## 2016-10-02 ENCOUNTER — Telehealth: Payer: Self-pay | Admitting: Oncology

## 2016-10-02 ENCOUNTER — Ambulatory Visit (HOSPITAL_BASED_OUTPATIENT_CLINIC_OR_DEPARTMENT_OTHER): Payer: Medicare Other | Admitting: Nurse Practitioner

## 2016-10-02 ENCOUNTER — Other Ambulatory Visit (HOSPITAL_BASED_OUTPATIENT_CLINIC_OR_DEPARTMENT_OTHER): Payer: Medicare Other

## 2016-10-02 VITALS — BP 138/70 | HR 71 | Temp 97.3°F | Resp 18 | Ht 76.0 in | Wt 231.5 lb

## 2016-10-02 DIAGNOSIS — C187 Malignant neoplasm of sigmoid colon: Secondary | ICD-10-CM

## 2016-10-02 DIAGNOSIS — G620 Drug-induced polyneuropathy: Secondary | ICD-10-CM

## 2016-10-02 DIAGNOSIS — Z452 Encounter for adjustment and management of vascular access device: Secondary | ICD-10-CM

## 2016-10-02 DIAGNOSIS — Z5111 Encounter for antineoplastic chemotherapy: Secondary | ICD-10-CM | POA: Diagnosis present

## 2016-10-02 DIAGNOSIS — D6959 Other secondary thrombocytopenia: Secondary | ICD-10-CM | POA: Diagnosis not present

## 2016-10-02 DIAGNOSIS — R232 Flushing: Secondary | ICD-10-CM

## 2016-10-02 DIAGNOSIS — C189 Malignant neoplasm of colon, unspecified: Secondary | ICD-10-CM

## 2016-10-02 DIAGNOSIS — K1231 Oral mucositis (ulcerative) due to antineoplastic therapy: Secondary | ICD-10-CM | POA: Diagnosis not present

## 2016-10-02 DIAGNOSIS — N189 Chronic kidney disease, unspecified: Secondary | ICD-10-CM

## 2016-10-02 LAB — COMPREHENSIVE METABOLIC PANEL
ALT: 15 U/L (ref 0–55)
AST: 14 U/L (ref 5–34)
Albumin: 3.8 g/dL (ref 3.5–5.0)
Alkaline Phosphatase: 93 U/L (ref 40–150)
Anion Gap: 9 mEq/L (ref 3–11)
BUN: 34.7 mg/dL — AB (ref 7.0–26.0)
CALCIUM: 9.6 mg/dL (ref 8.4–10.4)
CHLORIDE: 108 meq/L (ref 98–109)
CO2: 24 mEq/L (ref 22–29)
CREATININE: 2.3 mg/dL — AB (ref 0.7–1.3)
EGFR: 28 mL/min/{1.73_m2} — ABNORMAL LOW (ref >90)
GLUCOSE: 95 mg/dL (ref 70–140)
Potassium: 4 mEq/L (ref 3.5–5.1)
SODIUM: 142 meq/L (ref 136–145)
Total Bilirubin: 0.39 mg/dL (ref 0.20–1.20)
Total Protein: 7.3 g/dL (ref 6.4–8.3)

## 2016-10-02 LAB — CBC WITH DIFFERENTIAL/PLATELET
BASO%: 0.9 % (ref 0.0–2.0)
Basophils Absolute: 0 10*3/uL (ref 0.0–0.1)
EOS%: 4.3 % (ref 0.0–7.0)
Eosinophils Absolute: 0.2 10*3/uL (ref 0.0–0.5)
HEMATOCRIT: 37.4 % — AB (ref 38.4–49.9)
HEMOGLOBIN: 12.5 g/dL — AB (ref 13.0–17.1)
LYMPH#: 1.2 10*3/uL (ref 0.9–3.3)
LYMPH%: 26.2 % (ref 14.0–49.0)
MCH: 30.8 pg (ref 27.2–33.4)
MCHC: 33.6 g/dL (ref 32.0–36.0)
MCV: 91.9 fL (ref 79.3–98.0)
MONO#: 0.7 10*3/uL (ref 0.1–0.9)
MONO%: 14.4 % — ABNORMAL HIGH (ref 0.0–14.0)
NEUT#: 2.5 10*3/uL (ref 1.5–6.5)
NEUT%: 54.2 % (ref 39.0–75.0)
Platelets: 145 10*3/uL (ref 140–400)
RBC: 4.07 10*6/uL — ABNORMAL LOW (ref 4.20–5.82)
RDW: 18.4 % — AB (ref 11.0–14.6)
WBC: 4.6 10*3/uL (ref 4.0–10.3)

## 2016-10-02 MED ORDER — SODIUM CHLORIDE 0.9% FLUSH
10.0000 mL | INTRAVENOUS | Status: DC | PRN
  Filled 2016-10-02: qty 10

## 2016-10-02 MED ORDER — DEXAMETHASONE SODIUM PHOSPHATE 10 MG/ML IJ SOLN
INTRAMUSCULAR | Status: AC
  Filled 2016-10-02: qty 1

## 2016-10-02 MED ORDER — SODIUM CHLORIDE 0.9 % IV SOLN
1800.0000 mg/m2 | INTRAVENOUS | Status: DC
  Administered 2016-10-02: 4200 mg via INTRAVENOUS
  Filled 2016-10-02: qty 84

## 2016-10-02 MED ORDER — HEPARIN SOD (PORK) LOCK FLUSH 100 UNIT/ML IV SOLN
500.0000 [IU] | Freq: Once | INTRAVENOUS | Status: DC | PRN
  Filled 2016-10-02: qty 5

## 2016-10-02 MED ORDER — LEUCOVORIN CALCIUM INJECTION 350 MG
299.0000 mg/m2 | Freq: Once | INTRAVENOUS | Status: AC
  Administered 2016-10-02: 700 mg via INTRAVENOUS
  Filled 2016-10-02: qty 35

## 2016-10-02 MED ORDER — SODIUM CHLORIDE 0.9% FLUSH
10.0000 mL | Freq: Once | INTRAVENOUS | Status: AC
  Administered 2016-10-02: 10 mL via INTRAVENOUS
  Filled 2016-10-02: qty 10

## 2016-10-02 MED ORDER — DEXTROSE 5 % IV SOLN
Freq: Once | INTRAVENOUS | Status: AC
  Administered 2016-10-02: 13:00:00 via INTRAVENOUS

## 2016-10-02 MED ORDER — OXALIPLATIN CHEMO INJECTION 100 MG/20ML
65.0000 mg/m2 | Freq: Once | INTRAVENOUS | Status: AC
  Administered 2016-10-02: 150 mg via INTRAVENOUS
  Filled 2016-10-02: qty 20

## 2016-10-02 MED ORDER — FLUOROURACIL CHEMO INJECTION 2.5 GM/50ML
300.0000 mg/m2 | Freq: Once | INTRAVENOUS | Status: AC
  Administered 2016-10-02: 700 mg via INTRAVENOUS
  Filled 2016-10-02: qty 14

## 2016-10-02 MED ORDER — DEXAMETHASONE SODIUM PHOSPHATE 10 MG/ML IJ SOLN
10.0000 mg | Freq: Once | INTRAMUSCULAR | Status: AC
  Administered 2016-10-02: 10 mg via INTRAVENOUS

## 2016-10-02 MED ORDER — PALONOSETRON HCL INJECTION 0.25 MG/5ML
0.2500 mg | Freq: Once | INTRAVENOUS | Status: AC
  Administered 2016-10-02: 0.25 mg via INTRAVENOUS

## 2016-10-02 MED ORDER — PALONOSETRON HCL INJECTION 0.25 MG/5ML
INTRAVENOUS | Status: AC
  Filled 2016-10-02: qty 5

## 2016-10-02 NOTE — Telephone Encounter (Signed)
LOS had no treatment date. Confirmed treatment date of 10/30/16 for FOLFOX with Lattie Haw .Appointments scheduled per 10/02/16 los. Patient was given a copy of the AVS report and appointment schedule, per 10/02/16 los.

## 2016-10-02 NOTE — Patient Instructions (Signed)
[name] Discharge Instructions for Patients Receiving Chemotherapy  Today you received the following: Oxaliplatin, Leucovorin, and Adrucil.   To help prevent nausea and vomiting after your treatment, we encourage you to take your nausea medication as prescribed.    If you develop nausea and vomiting that is not controlled by your nausea medication, call the clinic.   BELOW ARE SYMPTOMS THAT SHOULD BE REPORTED IMMEDIATELY:  *FEVER GREATER THAN 100.5 F  *CHILLS WITH OR WITHOUT FEVER  NAUSEA AND VOMITING THAT IS NOT CONTROLLED WITH YOUR NAUSEA MEDICATION  *UNUSUAL SHORTNESS OF BREATH  *UNUSUAL BRUISING OR BLEEDING  TENDERNESS IN MOUTH AND THROAT WITH OR WITHOUT PRESENCE OF ULCERS  *URINARY PROBLEMS  *BOWEL PROBLEMS  UNUSUAL RASH Items with * indicate a potential emergency and should be followed up as soon as possible.  Feel free to call the clinic you have any questions or concerns. The clinic phone number is [phone].  Please show the [facility] at check-in to the Emergency Department and triage nurse.

## 2016-10-02 NOTE — Progress Notes (Addendum)
  [facility] OFFICE PROGRESS NOTE   Diagnosis:  Colon cancer  INTERVAL HISTORY:   [name] returns as scheduled. He completed cycle 9 FOLFOX 09/17/2016. Oxaliplatin was held due to neuropathy. He thinks the numbness is somewhat better as compared to 2 weeks ago. He has difficulty with very small buttons and sometimes turning the pages of a book. No numbness in his feet. He denies nausea/vomiting. He has periodic loose stools. No mouth sores after the most recent chemotherapy.  Objective:  Vital signs in last 24 hours:  Blood pressure 138/70, pulse 71, temperature 97.3 F (36.3 C), temperature source Oral, resp. rate 18, height _0  (1.93 m), weight 231 lb 8 oz (105 kg), SpO2 100 %.    HEENT: No thrush or ulcers. Resp: Lungs clear bilaterally. Cardio: Regular rate and rhythm. GI: Abdomen soft and nontender. No hepatomegaly. Left lower quadrant colostomy. Vascular: No leg edema. Neuro: Vibratory sense mildly to moderately decreased over the fingertips per tuning fork exam.  Port-A-Cath without erythema.  Lab Results:  Lab Results  Component Value Date   WBC 4.6 10/02/2016   HGB 12.5 (L) 10/02/2016   HCT 37.4 (L) 10/02/2016   MCV 91.9 10/02/2016   PLT 145 10/02/2016   NEUTROABS 2.5 10/02/2016    Imaging:  No results found.  Medications: I have reviewed the patient's current medications.  Assessment/Plan: 1.Sigmoid colon cancer, stage IV (Y5K,P5W,S5K), isolated mesenteric implant-resected, multiple tumor deposits ? Sigmoid/descending resection and creation of a descending colostomy 04/10/2016 ? MSI-stable, no loss of mismatch repair protein expression ? Foundation 1-BRAF V600Epositive, MS-stable, intermediate tumor mutation burden, no RAS mutation ? CT abdomen/pelvis 04/02/2016-no evidence of distant metastatic disease ? Cycle 1 FOLFOX 05/21/2016 ? Cycle 2 FOLFOX 06/04/2016 ? Cycle 3 FOLFOX 06/18/2016 ? Cycle 4 FOLFOX 07/02/2016 (oxaliplatin held  secondary to thrombocytopenia) ? Cycle 5 FOLFOX 07/16/2016 ? Cycle 6 FOLFOX 07/30/2016 ? Cycle 7 FOLFOX 08/13/2016 (oxaliplatin held and 5-FU dose reduced) ? Cycle 8 FOLFOX 09/03/2016 (oxaliplatin held secondary to neuropathy) ? Cycle 9 FOLFOX 09/17/2016 (oxaliplatin held secondary to neuropathy) ? Cycle 10 FOLFOX 10/02/2016  2. Chronic renal insufficiency  3. Hypertension  4. Inflamed sebaceous cyst at the upper back-status post incision and drainage  5. Thrombocytopenia secondary chemotherapy-oxaliplatin dose reduced beginning with cycle 3 FOLFOX, oxaliplatin held with cycle 4 and cycle 7 FOLFOX  6. Oxaliplatin neuropathy  7. Mucositis secondary chemotherapy    Disposition: Louis [name] appears stable. He has completed 9 cycles of FOLFOX. Oxaliplatin was held with cycles 7, 8 and 9 due to neuropathy. The neuropathy symptoms have improved. Oxaliplatin will be resumed with cycle 10 today. He understands the neuropathy may worsen following treatment today. He is agreeable to proceed. The plan is to hold oxaliplatin with cycles 11 and 12.  He will return for a follow-up visit and cycle 11 in 2 weeks. He will contact the office in the interim with any problems.  Patient seen with Dr. Benay Spice.    Ned Card ANP/GNP-BC   10/02/2016  12:13 PM  This was a shared visit with Ned Card. Louis [name] has persistent mild neuropathy symptoms, but he has noted improvement over the past herbal weeks. We discussed the risk/benefit of completing an additional cycle of chemotherapy with oxaliplatin. He agrees to proceed with oxaliplatin today.  Julieanne Manson, M.D.

## 2016-10-04 ENCOUNTER — Ambulatory Visit (HOSPITAL_BASED_OUTPATIENT_CLINIC_OR_DEPARTMENT_OTHER): Payer: Self-pay

## 2016-10-04 VITALS — BP 155/75 | HR 71 | Temp 98.3°F | Resp 18

## 2016-10-04 DIAGNOSIS — C189 Malignant neoplasm of colon, unspecified: Secondary | ICD-10-CM

## 2016-10-04 MED ORDER — HEPARIN SOD (PORK) LOCK FLUSH 100 UNIT/ML IV SOLN
500.0000 [IU] | Freq: Once | INTRAVENOUS | Status: AC | PRN
  Administered 2016-10-04: 500 [IU]
  Filled 2016-10-04: qty 5

## 2016-10-04 MED ORDER — SODIUM CHLORIDE 0.9% FLUSH
10.0000 mL | INTRAVENOUS | Status: DC | PRN
  Administered 2016-10-04: 10 mL
  Filled 2016-10-04: qty 10

## 2016-10-04 NOTE — Patient Instructions (Signed)
Implanted Port Home Guide An implanted port is a type of central line that is placed under the skin. Central lines are used to provide IV access when treatment or nutrition needs to be given through a person's veins. Implanted ports are used for long-term IV access. An implanted port may be placed because:  You need IV medicine that would be irritating to the small veins in your hands or arms.  You need long-term IV medicines, such as antibiotics.  You need IV nutrition for a long period.  You need frequent blood draws for lab tests.  You need dialysis.  Implanted ports are usually placed in the chest area, but they can also be placed in the upper arm, the abdomen, or the leg. An implanted port has two main parts:  Reservoir. The reservoir is round and will appear as a small, raised area under your skin. The reservoir is the part where a needle is inserted to give medicines or draw blood.  Catheter. The catheter is a thin, flexible tube that extends from the reservoir. The catheter is placed into a large vein. Medicine that is inserted into the reservoir goes into the catheter and then into the vein.  How will I care for my incision site? Do not get the incision site wet. Bathe or shower as directed by your health care provider. How is my port accessed? Special steps must be taken to access the port:  Before the port is accessed, a numbing cream can be placed on the skin. This helps numb the skin over the port site.  Your health care provider uses a sterile technique to access the port. ? Your health care provider must put on a mask and sterile gloves. ? The skin over your port is cleaned carefully with an antiseptic and allowed to dry. ? The port is gently pinched between sterile gloves, and a needle is inserted into the port.  Only "non-coring" port needles should be used to access the port. Once the port is accessed, a blood return should be checked. This helps ensure that the port  is in the vein and is not clogged.  If your port needs to remain accessed for a constant infusion, a clear (transparent) bandage will be placed over the needle site. The bandage and needle will need to be changed every week, or as directed by your health care provider.  Keep the bandage covering the needle clean and dry. Do not get it wet. Follow your health care provider's instructions on how to take a shower or bath while the port is accessed.  If your port does not need to stay accessed, no bandage is needed over the port.  What is flushing? Flushing helps keep the port from getting clogged. Follow your health care provider's instructions on how and when to flush the port. Ports are usually flushed with saline solution or a medicine called heparin. The need for flushing will depend on how the port is used.  If the port is used for intermittent medicines or blood draws, the port will need to be flushed: ? After medicines have been given. ? After blood has been drawn. ? As part of routine maintenance.  If a constant infusion is running, the port may not need to be flushed.  How long will my port stay implanted? The port can stay in for as long as your health care provider thinks it is needed. When it is time for the port to come out, surgery will be   done to remove it. The procedure is similar to the one performed when the port was put in. When should I seek immediate medical care? When you have an implanted port, you should seek immediate medical care if:  You notice a bad smell coming from the incision site.  You have swelling, redness, or drainage at the incision site.  You have more swelling or pain at the port site or the surrounding area.  You have a fever that is not controlled with medicine.  This information is not intended to replace advice given to you by your health care provider. Make sure you discuss any questions you have with your health care provider. Document  Released: 04/21/2005 Document Revised: 09/27/2015 Document Reviewed: 12/27/2012 Elsevier Interactive Patient Education  2017 [employer] Inc.  

## 2016-10-12 ENCOUNTER — Other Ambulatory Visit: Payer: Self-pay | Admitting: Oncology

## 2016-10-15 ENCOUNTER — Ambulatory Visit (HOSPITAL_BASED_OUTPATIENT_CLINIC_OR_DEPARTMENT_OTHER): Payer: Medicare Other

## 2016-10-15 ENCOUNTER — Ambulatory Visit: Payer: Medicare Other

## 2016-10-15 ENCOUNTER — Ambulatory Visit (HOSPITAL_BASED_OUTPATIENT_CLINIC_OR_DEPARTMENT_OTHER): Payer: Medicare Other | Admitting: Nurse Practitioner

## 2016-10-15 ENCOUNTER — Other Ambulatory Visit (HOSPITAL_BASED_OUTPATIENT_CLINIC_OR_DEPARTMENT_OTHER): Payer: Medicare Other

## 2016-10-15 VITALS — BP 158/94 | HR 69 | Temp 98.4°F | Resp 18 | Ht 76.0 in | Wt 231.5 lb

## 2016-10-15 DIAGNOSIS — N189 Chronic kidney disease, unspecified: Secondary | ICD-10-CM

## 2016-10-15 DIAGNOSIS — C187 Malignant neoplasm of sigmoid colon: Secondary | ICD-10-CM

## 2016-10-15 DIAGNOSIS — D6959 Other secondary thrombocytopenia: Secondary | ICD-10-CM | POA: Diagnosis not present

## 2016-10-15 DIAGNOSIS — K1231 Oral mucositis (ulcerative) due to antineoplastic therapy: Secondary | ICD-10-CM | POA: Diagnosis not present

## 2016-10-15 DIAGNOSIS — G620 Drug-induced polyneuropathy: Secondary | ICD-10-CM

## 2016-10-15 DIAGNOSIS — C189 Malignant neoplasm of colon, unspecified: Secondary | ICD-10-CM

## 2016-10-15 DIAGNOSIS — Z5111 Encounter for antineoplastic chemotherapy: Secondary | ICD-10-CM | POA: Diagnosis present

## 2016-10-15 DIAGNOSIS — I10 Essential (primary) hypertension: Secondary | ICD-10-CM

## 2016-10-15 LAB — COMPREHENSIVE METABOLIC PANEL
ALT: 16 U/L (ref 0–55)
AST: 16 U/L (ref 5–34)
Albumin: 3.5 g/dL (ref 3.5–5.0)
Alkaline Phosphatase: 89 U/L (ref 40–150)
Anion Gap: 9 mEq/L (ref 3–11)
BILIRUBIN TOTAL: 0.42 mg/dL (ref 0.20–1.20)
BUN: 29.9 mg/dL — ABNORMAL HIGH (ref 7.0–26.0)
CO2: 25 meq/L (ref 22–29)
Calcium: 9.3 mg/dL (ref 8.4–10.4)
Chloride: 108 mEq/L (ref 98–109)
Creatinine: 2.5 mg/dL — ABNORMAL HIGH (ref 0.7–1.3)
EGFR: 26 mL/min/{1.73_m2} — ABNORMAL LOW (ref >90)
GLUCOSE: 137 mg/dL (ref 70–140)
Potassium: 4.1 mEq/L (ref 3.5–5.1)
SODIUM: 141 meq/L (ref 136–145)
TOTAL PROTEIN: 6.8 g/dL (ref 6.4–8.3)

## 2016-10-15 LAB — CBC WITH DIFFERENTIAL/PLATELET
BASO%: 0.5 % (ref 0.0–2.0)
Basophils Absolute: 0 10*3/uL (ref 0.0–0.1)
EOS%: 5 % (ref 0.0–7.0)
Eosinophils Absolute: 0.2 10*3/uL (ref 0.0–0.5)
HCT: 36.2 % — ABNORMAL LOW (ref 38.4–49.9)
HGB: 12.1 g/dL — ABNORMAL LOW (ref 13.0–17.1)
LYMPH%: 35.3 % (ref 14.0–49.0)
MCH: 30.9 pg (ref 27.2–33.4)
MCHC: 33.4 g/dL (ref 32.0–36.0)
MCV: 92.6 fL (ref 79.3–98.0)
MONO#: 0.4 10*3/uL (ref 0.1–0.9)
MONO%: 9.8 % (ref 0.0–14.0)
NEUT%: 49.4 % (ref 39.0–75.0)
NEUTROS ABS: 1.9 10*3/uL (ref 1.5–6.5)
NRBC: 0 % (ref 0–0)
Platelets: 105 10*3/uL — ABNORMAL LOW (ref 140–400)
RBC: 3.91 10*6/uL — AB (ref 4.20–5.82)
RDW: 16.4 % — AB (ref 11.0–14.6)
WBC: 3.8 10*3/uL — AB (ref 4.0–10.3)
lymph#: 1.3 10*3/uL (ref 0.9–3.3)

## 2016-10-15 MED ORDER — LEUCOVORIN CALCIUM INJECTION 350 MG
299.0000 mg/m2 | Freq: Once | INTRAVENOUS | Status: AC
  Administered 2016-10-15: 700 mg via INTRAVENOUS
  Filled 2016-10-15: qty 35

## 2016-10-15 MED ORDER — FLUOROURACIL CHEMO INJECTION 2.5 GM/50ML
300.0000 mg/m2 | Freq: Once | INTRAVENOUS | Status: AC
  Administered 2016-10-15: 700 mg via INTRAVENOUS
  Filled 2016-10-15: qty 14

## 2016-10-15 MED ORDER — SODIUM CHLORIDE 0.9 % IV SOLN
Freq: Once | INTRAVENOUS | Status: AC
  Administered 2016-10-15: 10:00:00 via INTRAVENOUS

## 2016-10-15 MED ORDER — FLUOROURACIL CHEMO INJECTION 5 GM/100ML
1800.0000 mg/m2 | INTRAVENOUS | Status: DC
  Administered 2016-10-15: 4200 mg via INTRAVENOUS
  Filled 2016-10-15: qty 84

## 2016-10-15 NOTE — Progress Notes (Addendum)
  [facility] OFFICE PROGRESS NOTE   Diagnosis:  Colon cancer  INTERVAL HISTORY:   Louis [name] returns as scheduled. He completed cycle 10 of FOLFOX 10/02/2016. He had nausea for "a few days". He had a few mouth sores. No problems with eating or drinking. The mouth sores have resolved. No diarrhea. He notes increased neuropathy symptoms. His feet feel "slippery". He has some difficulty with small buttons.  Objective:  Vital signs in last 24 hours:  Blood pressure (!) 158/94, pulse 69, temperature 98.4 F (36.9 C), temperature source Oral, resp. rate 18, height _0  (1.93 m), weight 231 lb 8 oz (105 kg), SpO2 100 %.    HEENT: No thrush or ulcers. Resp: Lungs clear bilaterally. Cardio: Regular rate and rhythm. GI: Abdomen soft and nontender. No hepatomegaly. Left lower quadrant colostomy. Vascular: No leg edema. Neuro: Vibratory sense moderately decreased over the fingertips per tuning fork exam.  Port-A-Cath without erythema.  Lab Results:  Lab Results  Component Value Date   WBC 3.8 (L) 10/15/2016   HGB 12.1 (L) 10/15/2016   HCT 36.2 (L) 10/15/2016   MCV 92.6 10/15/2016   PLT 105 (L) 10/15/2016   NEUTROABS 1.9 10/15/2016    Imaging:  No results found.  Medications: I have reviewed the patient's current medications.  Assessment/Plan: 1.Sigmoid colon cancer, stage IV (M6N,O1R,R1H), isolated mesenteric implant-resected, multiple tumor deposits ? Sigmoid/descending resection and creation of a descending colostomy 04/10/2016 ? MSI-stable, no loss of mismatch repair protein expression ? Foundation 1-BRAF V600Epositive, MS-stable, intermediate tumor mutation burden, no RAS mutation ? CT abdomen/pelvis 04/02/2016-no evidence of distant metastatic disease ? Cycle 1 FOLFOX 05/21/2016 ? Cycle 2 FOLFOX 06/04/2016 ? Cycle 3 FOLFOX 06/18/2016 ? Cycle 4 FOLFOX 07/02/2016 (oxaliplatin held secondary to thrombocytopenia) ? Cycle 5 FOLFOX 07/16/2016 ? Cycle 6  FOLFOX 07/30/2016 ? Cycle 7 FOLFOX 08/13/2016 (oxaliplatin held and 5-FU dose reduced) ? Cycle 8 FOLFOX 09/03/2016 (oxaliplatin held secondary to neuropathy) ? Cycle 9 FOLFOX 09/17/2016 (oxaliplatin held secondary to neuropathy) ? Cycle 10 FOLFOX 10/02/2016 ? Cycle 11 FOLFOX 10/15/2016 (oxaliplatin eliminated from the regimen)  2. Chronic renal insufficiency  3. Hypertension  4. Inflamed sebaceous cyst at the upper back-status post incision and drainage  5. Thrombocytopenia secondary chemotherapy-oxaliplatin dose reduced beginning with cycle 3 FOLFOX, oxaliplatin held with cycle 4 and cycle 7 FOLFOX  6. Oxaliplatin neuropathy  7. Mucositis secondary chemotherapy   Disposition: Louis [name] appears stable. He has completed 10 cycles of FOLFOX. Plan to proceed with cycle 11 today as scheduled.   He has progressive neuropathy symptoms. Oxaliplatin will be held with cycles 11 and 12.  He will return for a follow-up visit and cycle 12 in 2 weeks. He will contact the office in the interim with any problems.  Patient seen with Dr. Benay Spice.    Louis [name] ANP/GNP-BC   10/15/2016  9:38 AM  This was a shared visit with Louis [name]. Louis [name] will complete 2 additional cycles of adjuvant chemotherapy.  Louis [name], M.D.

## 2016-10-15 NOTE — Patient Instructions (Signed)
[facility] Discharge Instructions for Patients Receiving Chemotherapy  Today you received the following chemotherapy agents: Leucovorin and Adrucil  To help prevent nausea and vomiting after your treatment, we encourage you to take your nausea medication as directed.    If you develop nausea and vomiting that is not controlled by your nausea medication, call the clinic.   BELOW ARE SYMPTOMS THAT SHOULD BE REPORTED IMMEDIATELY:  *FEVER GREATER THAN 100.5 F  *CHILLS WITH OR WITHOUT FEVER  NAUSEA AND VOMITING THAT IS NOT CONTROLLED WITH YOUR NAUSEA MEDICATION  *UNUSUAL SHORTNESS OF BREATH  *UNUSUAL BRUISING OR BLEEDING  TENDERNESS IN MOUTH AND THROAT WITH OR WITHOUT PRESENCE OF ULCERS  *URINARY PROBLEMS  *BOWEL PROBLEMS  UNUSUAL RASH Items with * indicate a potential emergency and should be followed up as soon as possible.  Feel free to call the clinic you have any questions or concerns. The clinic phone number is [phone].  Please show the CHEMO ALERT CARD at check-in to the Emergency Department and triage nurse.   

## 2016-10-15 NOTE — Patient Instructions (Signed)
Implanted Port Home Guide An implanted port is a type of central line that is placed under the skin. Central lines are used to provide IV access when treatment or nutrition needs to be given through a person's veins. Implanted ports are used for long-term IV access. An implanted port may be placed because:  You need IV medicine that would be irritating to the small veins in your hands or arms.  You need long-term IV medicines, such as antibiotics.  You need IV nutrition for a long period.  You need frequent blood draws for lab tests.  You need dialysis.  Implanted ports are usually placed in the chest area, but they can also be placed in the upper arm, the abdomen, or the leg. An implanted port has two main parts:  Reservoir. The reservoir is round and will appear as a small, raised area under your skin. The reservoir is the part where a needle is inserted to give medicines or draw blood.  Catheter. The catheter is a thin, flexible tube that extends from the reservoir. The catheter is placed into a large vein. Medicine that is inserted into the reservoir goes into the catheter and then into the vein.  How will I care for my incision site? Do not get the incision site wet. Bathe or shower as directed by your health care provider. How is my port accessed? Special steps must be taken to access the port:  Before the port is accessed, a numbing cream can be placed on the skin. This helps numb the skin over the port site.  Your health care provider uses a sterile technique to access the port. ? Your health care provider must put on a mask and sterile gloves. ? The skin over your port is cleaned carefully with an antiseptic and allowed to dry. ? The port is gently pinched between sterile gloves, and a needle is inserted into the port.  Only "non-coring" port needles should be used to access the port. Once the port is accessed, a blood return should be checked. This helps ensure that the port  is in the vein and is not clogged.  If your port needs to remain accessed for a constant infusion, a clear (transparent) bandage will be placed over the needle site. The bandage and needle will need to be changed every week, or as directed by your health care provider.  Keep the bandage covering the needle clean and dry. Do not get it wet. Follow your health care provider's instructions on how to take a shower or bath while the port is accessed.  If your port does not need to stay accessed, no bandage is needed over the port.  What is flushing? Flushing helps keep the port from getting clogged. Follow your health care provider's instructions on how and when to flush the port. Ports are usually flushed with saline solution or a medicine called heparin. The need for flushing will depend on how the port is used.  If the port is used for intermittent medicines or blood draws, the port will need to be flushed: ? After medicines have been given. ? After blood has been drawn. ? As part of routine maintenance.  If a constant infusion is running, the port may not need to be flushed.  How long will my port stay implanted? The port can stay in for as long as your health care provider thinks it is needed. When it is time for the port to come out, surgery will be   done to remove it. The procedure is similar to the one performed when the port was put in. When should I seek immediate medical care? When you have an implanted port, you should seek immediate medical care if:  You notice a bad smell coming from the incision site.  You have swelling, redness, or drainage at the incision site.  You have more swelling or pain at the port site or the surrounding area.  You have a fever that is not controlled with medicine.  This information is not intended to replace advice given to you by your health care provider. Make sure you discuss any questions you have with your health care provider. Document  Released: 04/21/2005 Document Revised: 09/27/2015 Document Reviewed: 12/27/2012 Elsevier Interactive Patient Education  2017 [employer] Inc.  

## 2016-10-15 NOTE — Progress Notes (Signed)
Per Ned Card NP, labs have been reviewed and okay to proceed with treatment. (WBC 3.8 Platelets 105 Creatinine 2.5)  Blood return noted before, during and after Adrucil push

## 2016-10-17 ENCOUNTER — Ambulatory Visit (HOSPITAL_BASED_OUTPATIENT_CLINIC_OR_DEPARTMENT_OTHER): Payer: Medicare Other

## 2016-10-17 VITALS — BP 152/91 | HR 74 | Temp 98.7°F | Resp 18

## 2016-10-17 DIAGNOSIS — Z452 Encounter for adjustment and management of vascular access device: Secondary | ICD-10-CM | POA: Diagnosis not present

## 2016-10-17 DIAGNOSIS — C189 Malignant neoplasm of colon, unspecified: Secondary | ICD-10-CM | POA: Diagnosis not present

## 2016-10-17 MED ORDER — SODIUM CHLORIDE 0.9% FLUSH
10.0000 mL | INTRAVENOUS | Status: DC | PRN
  Administered 2016-10-17: 10 mL
  Filled 2016-10-17: qty 10

## 2016-10-17 MED ORDER — HEPARIN SOD (PORK) LOCK FLUSH 100 UNIT/ML IV SOLN
500.0000 [IU] | Freq: Once | INTRAVENOUS | Status: AC | PRN
  Administered 2016-10-17: 500 [IU]
  Filled 2016-10-17: qty 5

## 2016-10-26 ENCOUNTER — Other Ambulatory Visit: Payer: Self-pay | Admitting: Oncology

## 2016-10-30 ENCOUNTER — Ambulatory Visit (HOSPITAL_BASED_OUTPATIENT_CLINIC_OR_DEPARTMENT_OTHER): Payer: Medicare Other

## 2016-10-30 ENCOUNTER — Ambulatory Visit (HOSPITAL_BASED_OUTPATIENT_CLINIC_OR_DEPARTMENT_OTHER): Payer: Medicare Other | Admitting: Oncology

## 2016-10-30 ENCOUNTER — Other Ambulatory Visit (HOSPITAL_BASED_OUTPATIENT_CLINIC_OR_DEPARTMENT_OTHER): Payer: Medicare Other

## 2016-10-30 VITALS — BP 152/78 | HR 64 | Temp 98.0°F | Resp 16 | Wt 230.5 lb

## 2016-10-30 DIAGNOSIS — G620 Drug-induced polyneuropathy: Secondary | ICD-10-CM

## 2016-10-30 DIAGNOSIS — Z452 Encounter for adjustment and management of vascular access device: Secondary | ICD-10-CM | POA: Diagnosis not present

## 2016-10-30 DIAGNOSIS — Z5111 Encounter for antineoplastic chemotherapy: Secondary | ICD-10-CM | POA: Diagnosis present

## 2016-10-30 DIAGNOSIS — D6959 Other secondary thrombocytopenia: Secondary | ICD-10-CM | POA: Diagnosis not present

## 2016-10-30 DIAGNOSIS — C187 Malignant neoplasm of sigmoid colon: Secondary | ICD-10-CM

## 2016-10-30 DIAGNOSIS — Z95828 Presence of other vascular implants and grafts: Secondary | ICD-10-CM

## 2016-10-30 DIAGNOSIS — N189 Chronic kidney disease, unspecified: Secondary | ICD-10-CM

## 2016-10-30 DIAGNOSIS — C189 Malignant neoplasm of colon, unspecified: Secondary | ICD-10-CM

## 2016-10-30 DIAGNOSIS — I10 Essential (primary) hypertension: Secondary | ICD-10-CM

## 2016-10-30 LAB — CBC WITH DIFFERENTIAL/PLATELET
BASO%: 1 % (ref 0.0–2.0)
Basophils Absolute: 0 10*3/uL (ref 0.0–0.1)
EOS ABS: 0.2 10*3/uL (ref 0.0–0.5)
EOS%: 5.2 % (ref 0.0–7.0)
HCT: 38.4 % (ref 38.4–49.9)
HGB: 12.7 g/dL — ABNORMAL LOW (ref 13.0–17.1)
LYMPH%: 31.4 % (ref 14.0–49.0)
MCH: 30.7 pg (ref 27.2–33.4)
MCHC: 33.1 g/dL (ref 32.0–36.0)
MCV: 92.9 fL (ref 79.3–98.0)
MONO#: 0.7 10*3/uL (ref 0.1–0.9)
MONO%: 18.1 % — AB (ref 0.0–14.0)
NEUT%: 44.3 % (ref 39.0–75.0)
NEUTROS ABS: 1.6 10*3/uL (ref 1.5–6.5)
Platelets: 107 10*3/uL — ABNORMAL LOW (ref 140–400)
RBC: 4.14 10*6/uL — AB (ref 4.20–5.82)
RDW: 17.5 % — ABNORMAL HIGH (ref 11.0–14.6)
WBC: 3.7 10*3/uL — AB (ref 4.0–10.3)
lymph#: 1.2 10*3/uL (ref 0.9–3.3)

## 2016-10-30 LAB — COMPREHENSIVE METABOLIC PANEL
ALT: 18 U/L (ref 0–55)
AST: 17 U/L (ref 5–34)
Albumin: 3.8 g/dL (ref 3.5–5.0)
Alkaline Phosphatase: 101 U/L (ref 40–150)
Anion Gap: 8 mEq/L (ref 3–11)
BUN: 35.3 mg/dL — ABNORMAL HIGH (ref 7.0–26.0)
CHLORIDE: 107 meq/L (ref 98–109)
CO2: 27 meq/L (ref 22–29)
Calcium: 9.6 mg/dL (ref 8.4–10.4)
Creatinine: 2.3 mg/dL — ABNORMAL HIGH (ref 0.7–1.3)
EGFR: 28 mL/min/{1.73_m2} — AB (ref >90)
GLUCOSE: 107 mg/dL (ref 70–140)
Potassium: 4 mEq/L (ref 3.5–5.1)
SODIUM: 143 meq/L (ref 136–145)
Total Bilirubin: 0.48 mg/dL (ref 0.20–1.20)
Total Protein: 6.9 g/dL (ref 6.4–8.3)

## 2016-10-30 LAB — CEA (IN HOUSE-CHCC)

## 2016-10-30 MED ORDER — SODIUM CHLORIDE 0.9 % IV SOLN
Freq: Once | INTRAVENOUS | Status: AC
  Administered 2016-10-30: 12:00:00 via INTRAVENOUS

## 2016-10-30 MED ORDER — SODIUM CHLORIDE 0.9 % IV SOLN
1800.0000 mg/m2 | INTRAVENOUS | Status: DC
  Administered 2016-10-30: 4200 mg via INTRAVENOUS
  Filled 2016-10-30: qty 84

## 2016-10-30 MED ORDER — SODIUM CHLORIDE 0.9% FLUSH
10.0000 mL | INTRAVENOUS | Status: DC | PRN
  Administered 2016-10-30: 10 mL via INTRAVENOUS
  Filled 2016-10-30: qty 10

## 2016-10-30 MED ORDER — FLUOROURACIL CHEMO INJECTION 2.5 GM/50ML
300.0000 mg/m2 | Freq: Once | INTRAVENOUS | Status: AC
  Administered 2016-10-30: 700 mg via INTRAVENOUS
  Filled 2016-10-30: qty 14

## 2016-10-30 MED ORDER — LEUCOVORIN CALCIUM INJECTION 350 MG
700.0000 mg | Freq: Once | INTRAVENOUS | Status: AC
  Administered 2016-10-30: 700 mg via INTRAVENOUS
  Filled 2016-10-30: qty 35

## 2016-10-30 NOTE — Progress Notes (Signed)
OK to treat with creatinine 2.3 today, per Dr. Benay Spice.

## 2016-10-30 NOTE — Patient Instructions (Signed)
[facility] Discharge Instructions for Patients Receiving Chemotherapy  Today you received the following chemotherapy agents Leucovorin, Fluorouracil, 5-FU injection  To help prevent nausea and vomiting after your treatment, we encourage you to take your nausea medication as prescribed  By  MD.     If you develop nausea and vomiting that is not controlled by your nausea medication, call the clinic.   BELOW ARE SYMPTOMS THAT SHOULD BE REPORTED IMMEDIATELY:  *FEVER GREATER THAN 100.5 F  *CHILLS WITH OR WITHOUT FEVER  NAUSEA AND VOMITING THAT IS NOT CONTROLLED WITH YOUR NAUSEA MEDICATION  *UNUSUAL SHORTNESS OF BREATH  *UNUSUAL BRUISING OR BLEEDING  TENDERNESS IN MOUTH AND THROAT WITH OR WITHOUT PRESENCE OF ULCERS  *URINARY PROBLEMS  *BOWEL PROBLEMS  UNUSUAL RASH Items with * indicate a potential emergency and should be followed up as soon as possible.  Feel free to call the clinic you have any questions or concerns. The clinic phone number is [phone].  Please show the [facility] at check-in to the Emergency Department and triage nurse.    Leucovorin injection What is this medicine? LEUCOVORIN (loo koe VOR in) is used to prevent or treat the harmful effects of some medicines. This medicine is used to treat anemia caused by a low amount of folic acid in the body. It is also used with 5-fluorouracil (5-FU) to treat colon cancer. This medicine may be used for other purposes; ask your health care provider or pharmacist if you have questions. What should I tell my health care provider before I take this medicine? They need to know if you have any of these conditions: -anemia from low levels of vitamin B-12 in the blood -an unusual or allergic reaction to leucovorin, folic acid, other medicines, foods, dyes, or preservatives -pregnant or trying to get pregnant -breast-feeding How should I use this medicine? This medicine is for injection into a muscle  or into a vein. It is given by a health care professional in a hospital or clinic setting. Talk to your pediatrician regarding the use of this medicine in children. Special care may be needed. Overdosage: If you think you have taken too much of this medicine contact a poison control center or emergency room at once. NOTE: This medicine is only for you. Do not share this medicine with others. What if I miss a dose? This does not apply. What may interact with this medicine? -capecitabine -fluorouracil -phenobarbital -phenytoin -primidone -trimethoprim-sulfamethoxazole This list may not describe all possible interactions. Give your health care provider a list of all the medicines, herbs, non-prescription drugs, or dietary supplements you use. Also tell them if you smoke, drink alcohol, or use illegal drugs. Some items may interact with your medicine. What should I watch for while using this medicine? Your condition will be monitored carefully while you are receiving this medicine. This medicine may increase the side effects of 5-fluorouracil, 5-FU. Tell your doctor or health care professional if you have diarrhea or mouth sores that do not get better or that get worse. What side effects may I notice from receiving this medicine? Side effects that you should report to your doctor or health care professional as soon as possible: -allergic reactions like skin rash, itching or hives, swelling of the face, lips, or tongue -breathing problems -fever, infection -mouth sores -unusual bleeding or bruising -unusually weak or tired Side effects that usually do not require medical attention (report to your doctor or health care professional if they continue or are  bothersome): -constipation or diarrhea -loss of appetite -nausea, vomiting This list may not describe all possible side effects. Call your doctor for medical advice about side effects. You may report side effects to FDA at  1-800-FDA-1088. Where should I keep my medicine? This drug is given in a hospital or clinic and will not be stored at home. NOTE: This sheet is a summary. It may not cover all possible information. If you have questions about this medicine, talk to your doctor, pharmacist, or health care provider.  2018 Elsevier/Gold Standard (2007-10-26 16:50:29)    Fluorouracil, 5-FU injection What is this medicine? FLUOROURACIL, 5-FU (flure oh YOOR a sil) is a chemotherapy drug. It slows the growth of cancer cells. This medicine is used to treat many types of cancer like breast cancer, colon or rectal cancer, pancreatic cancer, and stomach cancer. This medicine may be used for other purposes; ask your health care provider or pharmacist if you have questions. COMMON BRAND NAME(S): Adrucil What should I tell my health care provider before I take this medicine? They need to know if you have any of these conditions: -blood disorders -dihydropyrimidine dehydrogenase (DPD) deficiency -infection (especially a virus infection such as chickenpox, cold sores, or herpes) -kidney disease -liver disease -malnourished, poor nutrition -recent or ongoing radiation therapy -an unusual or allergic reaction to fluorouracil, other chemotherapy, other medicines, foods, dyes, or preservatives -pregnant or trying to get pregnant -breast-feeding How should I use this medicine? This drug is given as an infusion or injection into a vein. It is administered in a hospital or clinic by a specially trained health care professional. Talk to your pediatrician regarding the use of this medicine in children. Special care may be needed. Overdosage: If you think you have taken too much of this medicine contact a poison control center or emergency room at once. NOTE: This medicine is only for you. Do not share this medicine with others. What if I miss a dose? It is important not to miss your dose. Call your doctor or health care  professional if you are unable to keep an appointment. What may interact with this medicine? -allopurinol -cimetidine -dapsone -digoxin -hydroxyurea -leucovorin -levamisole -medicines for seizures like ethotoin, fosphenytoin, phenytoin -medicines to increase blood counts like filgrastim, pegfilgrastim, sargramostim -medicines that treat or prevent blood clots like warfarin, enoxaparin, and dalteparin -methotrexate -metronidazole -pyrimethamine -some other chemotherapy drugs like busulfan, cisplatin, estramustine, vinblastine -trimethoprim -trimetrexate -vaccines Talk to your doctor or health care professional before taking any of these medicines: -acetaminophen -aspirin -ibuprofen -ketoprofen -naproxen This list may not describe all possible interactions. Give your health care provider a list of all the medicines, herbs, non-prescription drugs, or dietary supplements you use. Also tell them if you smoke, drink alcohol, or use illegal drugs. Some items may interact with your medicine. What should I watch for while using this medicine? Visit your doctor for checks on your progress. This drug may make you feel generally unwell. This is not uncommon, as chemotherapy can affect healthy cells as well as cancer cells. Report any side effects. Continue your course of treatment even though you feel ill unless your doctor tells you to stop. In some cases, you may be given additional medicines to help with side effects. Follow all directions for their use. Call your doctor or health care professional for advice if you get a fever, chills or sore throat, or other symptoms of a cold or flu. Do not treat yourself. This drug decreases your body's ability to fight infections.  Try to avoid being around people who are sick. This medicine may increase your risk to bruise or bleed. Call your doctor or health care professional if you notice any unusual bleeding. Be careful brushing and flossing your teeth  or using a toothpick because you may get an infection or bleed more easily. If you have any dental work done, tell your dentist you are receiving this medicine. Avoid taking products that contain aspirin, acetaminophen, ibuprofen, naproxen, or ketoprofen unless instructed by your doctor. These medicines may hide a fever. Do not become pregnant while taking this medicine. Women should inform their doctor if they wish to become pregnant or think they might be pregnant. There is a potential for serious side effects to an unborn child. Talk to your health care professional or pharmacist for more information. Do not breast-feed an infant while taking this medicine. Men should inform their doctor if they wish to father a child. This medicine may lower sperm counts. Do not treat diarrhea with over the counter products. Contact your doctor if you have diarrhea that lasts more than 2 days or if it is severe and watery. This medicine can make you more sensitive to the sun. Keep out of the sun. If you cannot avoid being in the sun, wear protective clothing and use sunscreen. Do not use sun lamps or tanning beds/booths. What side effects may I notice from receiving this medicine? Side effects that you should report to your doctor or health care professional as soon as possible: -allergic reactions like skin rash, itching or hives, swelling of the face, lips, or tongue -low blood counts - this medicine may decrease the number of white blood cells, red blood cells and platelets. You may be at increased risk for infections and bleeding. -signs of infection - fever or chills, cough, sore throat, pain or difficulty passing urine -signs of decreased platelets or bleeding - bruising, pinpoint red spots on the skin, black, tarry stools, blood in the urine -signs of decreased red blood cells - unusually weak or tired, fainting spells, lightheadedness -breathing problems -changes in vision -chest pain -mouth  sores -nausea and vomiting -pain, swelling, redness at site where injected -pain, tingling, numbness in the hands or feet -redness, swelling, or sores on hands or feet -stomach pain -unusual bleeding Side effects that usually do not require medical attention (report to your doctor or health care professional if they continue or are bothersome): -changes in finger or toe nails -diarrhea -dry or itchy skin -hair loss -headache -loss of appetite -sensitivity of eyes to the light -stomach upset -unusually teary eyes This list may not describe all possible side effects. Call your doctor for medical advice about side effects. You may report side effects to FDA at 1-800-FDA-1088. Where should I keep my medicine? This drug is given in a hospital or clinic and will not be stored at home. NOTE: This sheet is a summary. It may not cover all possible information. If you have questions about this medicine, talk to your doctor, pharmacist, or health care provider.  2018 Elsevier/Gold Standard (2007-08-25 13:53:16)

## 2016-10-30 NOTE — Progress Notes (Signed)
[facility] OFFICE PROGRESS NOTE   Diagnosis: Colon cancer  INTERVAL HISTORY:   [name] completed another cycle of 5-FU 10/15/2016. He had an ulcer at the right buccal mucosa. Stable mild numbness in the hands and feet. He plans to have the colostomy reversed in August.  Objective:  Vital signs in last 24 hours:  Blood pressure (!) 152/78, pulse 64, temperature 98 F (36.7 C), temperature source Oral, resp. rate 16, weight 230 lb 8 oz (104.6 kg), SpO2 100 %.    HEENT: No thrush or ulcers Lymphatics: No cervical, supraclavicular, or inguinal nodes. 1/2 cm mobile left axillary and pea-sized mobile right axillary nodes Resp: Lungs clear bilaterally Cardio: Regular rate and rhythm GI: No hepatosplenomegaly, left lower quadrant colostomy, no mass, nontender Vascular: No leg edema  Skin: Palms without erythema   Portacath/PICC-without erythema  Lab Results:  Lab Results  Component Value Date   WBC 3.7 (L) 10/30/2016   HGB 12.7 (L) 10/30/2016   HCT 38.4 10/30/2016   MCV 92.9 10/30/2016   PLT 107 (L) 10/30/2016   NEUTROABS 1.6 10/30/2016    CMP     Component Value Date/Time   NA 143 10/30/2016 1038   K 4.0 10/30/2016 1038   CL 108 05/14/2016 1155   CO2 27 10/30/2016 1038   GLUCOSE 107 10/30/2016 1038   BUN 35.3 (H) 10/30/2016 1038   CREATININE 2.3 (H) 10/30/2016 1038   CALCIUM 9.6 10/30/2016 1038   PROT 6.9 10/30/2016 1038   ALBUMIN 3.8 10/30/2016 1038   AST 17 10/30/2016 1038   ALT 18 10/30/2016 1038   ALKPHOS 101 10/30/2016 1038   BILITOT 0.48 10/30/2016 1038   GFRNONAA 27 (L) 05/14/2016 1155   GFRAA 31 (L) 05/14/2016 1155    Lab Results  Component Value Date   CEA1 1.53 07/16/2016     Medications: I have reviewed the patient's current medications.  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 Sigmoid/descending resection and creation of a descending  colostomy 04/10/2016 ? MSI-stable, no loss of mismatch repair protein expression ? Foundation 1-BRAF V600Epositive, MS-stable, intermediate tumor mutation burden, no RAS mutation ? CT abdomen/pelvis 04/02/2016-no evidence of distant metastatic disease ? Cycle 1 FOLFOX 05/21/2016 ? Cycle 2 FOLFOX 06/04/2016 ? Cycle 3 FOLFOX 06/18/2016 ? Cycle 4 FOLFOX 07/02/2016 (oxaliplatin held secondary to thrombocytopenia) ? Cycle 5 FOLFOX 07/16/2016 ? Cycle 6 FOLFOX 07/30/2016 ? Cycle 7 FOLFOX 08/13/2016 (oxaliplatin held and 5-FU dose reduced) ? Cycle 8 FOLFOX 09/03/2016 (oxaliplatin held secondary to neuropathy) ? Cycle 9 FOLFOX 09/17/2016 (oxaliplatin held secondary to neuropathy) ? Cycle 10 FOLFOX 10/02/2016 ? Cycle 11 FOLFOX 10/15/2016 (oxaliplatin eliminated from the regimen) ? Cycle 12 FOLFOX 10/30/2016 (oxaliplatin eliminated)  2. Chronic renal insufficiency  3. Hypertension  4. Inflamed sebaceous cyst at the upper back-status post incision and drainage  5. Thrombocytopenia secondary chemotherapy-oxaliplatin dose reduced beginning with cycle 3 FOLFOX, oxaliplatin held with cycle 4 and cycle 7 FOLFOX  6. Oxaliplatin neuropathy  7.  History ofMucositis secondary chemotherapy     Disposition:  He appears stable. He will complete the final cycle of adjuvant chemotherapy today. He would like to have the colostomy reversed in August. He will schedule an appointment with Dr. Hulen Skains. We will refer him for Port-A-Cath removal.  We will follow-up on the CEA from today. He will return for an office visit and CEA in 3 months. He has persistent mild thrombocytopenia. He will contact us for spontaneous bleeding or  bruising.  Donneta Romberg, MD  10/30/2016  11:52 AM

## 2016-11-01 ENCOUNTER — Ambulatory Visit (HOSPITAL_BASED_OUTPATIENT_CLINIC_OR_DEPARTMENT_OTHER): Payer: Self-pay

## 2016-11-01 VITALS — BP 143/77 | HR 69 | Temp 98.4°F | Resp 18

## 2016-11-01 DIAGNOSIS — C189 Malignant neoplasm of colon, unspecified: Secondary | ICD-10-CM

## 2016-11-01 MED ORDER — SODIUM CHLORIDE 0.9% FLUSH
10.0000 mL | INTRAVENOUS | Status: DC | PRN
  Administered 2016-11-01: 10 mL
  Filled 2016-11-01: qty 10

## 2016-11-01 MED ORDER — HEPARIN SOD (PORK) LOCK FLUSH 100 UNIT/ML IV SOLN
500.0000 [IU] | Freq: Once | INTRAVENOUS | Status: AC | PRN
  Administered 2016-11-01: 500 [IU]
  Filled 2016-11-01: qty 5

## 2016-11-03 ENCOUNTER — Telehealth: Payer: Self-pay | Admitting: Oncology

## 2016-11-03 NOTE — Telephone Encounter (Signed)
Appointments scheduled and confirmed with patient's wife. ° °

## 2016-11-10 ENCOUNTER — Other Ambulatory Visit: Payer: Self-pay | Admitting: Radiology

## 2016-11-11 ENCOUNTER — Encounter (HOSPITAL_COMMUNITY): Payer: Self-pay

## 2016-11-11 ENCOUNTER — Ambulatory Visit (HOSPITAL_COMMUNITY)
Admission: RE | Admit: 2016-11-11 | Discharge: 2016-11-11 | Disposition: A | Discharge status: Home / Self Care | Payer: Medicare Other | Source: Ambulatory Visit | Attending: Oncology | Admitting: Oncology

## 2016-11-11 VITALS — BP 137/76 | HR 61 | Temp 97.9°F | Resp 11

## 2016-11-11 DIAGNOSIS — Z9221 Personal history of antineoplastic chemotherapy: Secondary | ICD-10-CM | POA: Insufficient documentation

## 2016-11-11 DIAGNOSIS — Z5111 Encounter for antineoplastic chemotherapy: Secondary | ICD-10-CM | POA: Diagnosis not present

## 2016-11-11 DIAGNOSIS — Z452 Encounter for adjustment and management of vascular access device: Secondary | ICD-10-CM | POA: Diagnosis not present

## 2016-11-11 DIAGNOSIS — I129 Hypertensive chronic kidney disease with stage 1 through stage 4 chronic kidney disease, or unspecified chronic kidney disease: Secondary | ICD-10-CM | POA: Diagnosis not present

## 2016-11-11 DIAGNOSIS — Z95828 Presence of other vascular implants and grafts: Secondary | ICD-10-CM

## 2016-11-11 DIAGNOSIS — Z85038 Personal history of other malignant neoplasm of large intestine: Secondary | ICD-10-CM | POA: Insufficient documentation

## 2016-11-11 DIAGNOSIS — N182 Chronic kidney disease, stage 2 (mild): Secondary | ICD-10-CM | POA: Diagnosis not present

## 2016-11-11 DIAGNOSIS — C189 Malignant neoplasm of colon, unspecified: Secondary | ICD-10-CM

## 2016-11-11 HISTORY — PX: IR REMOVAL TUN ACCESS W/ PORT W/O FL MOD SED: IMG2290

## 2016-11-11 LAB — BASIC METABOLIC PANEL
Anion gap: 9 (ref 5–15)
BUN: 30 mg/dL — AB (ref 6–20)
CHLORIDE: 109 mmol/L (ref 101–111)
CO2: 22 mmol/L (ref 22–32)
Calcium: 9.5 mg/dL (ref 8.9–10.3)
Creatinine, Ser: 2.27 mg/dL — ABNORMAL HIGH (ref 0.61–1.24)
GFR calc Af Amer: 32 mL/min — ABNORMAL LOW (ref >60)
GFR calc non Af Amer: 28 mL/min — ABNORMAL LOW (ref >60)
GLUCOSE: 118 mg/dL — AB (ref 65–99)
POTASSIUM: 3.9 mmol/L (ref 3.5–5.1)
SODIUM: 140 mmol/L (ref 135–145)

## 2016-11-11 LAB — CBC
HEMATOCRIT: 38.3 % — AB (ref 39.0–52.0)
Hemoglobin: 13 g/dL (ref 13.0–17.0)
MCH: 30.2 pg (ref 26.0–34.0)
MCHC: 33.9 g/dL (ref 30.0–36.0)
MCV: 88.9 fL (ref 78.0–100.0)
Platelets: 127 10*3/uL — ABNORMAL LOW (ref 150–400)
RBC: 4.31 MIL/uL (ref 4.22–5.81)
RDW: 15.7 % — AB (ref 11.5–15.5)
WBC: 4.8 10*3/uL (ref 4.0–10.5)

## 2016-11-11 LAB — PROTIME-INR
INR: 0.97
Prothrombin Time: 12.8 seconds (ref 11.4–15.2)

## 2016-11-11 LAB — APTT: aPTT: 26 seconds (ref 24–36)

## 2016-11-11 MED ORDER — MIDAZOLAM HCL 2 MG/2ML IJ SOLN
INTRAMUSCULAR | Status: AC | PRN
  Administered 2016-11-11 (×2): 1 mg via INTRAVENOUS

## 2016-11-11 MED ORDER — SODIUM CHLORIDE 0.9 % IV SOLN
INTRAVENOUS | Status: DC
  Administered 2016-11-11: 09:00:00 via INTRAVENOUS

## 2016-11-11 MED ORDER — LIDOCAINE HCL 1 % IJ SOLN
INTRAMUSCULAR | Status: DC | PRN
  Administered 2016-11-11: 10 mL

## 2016-11-11 MED ORDER — MIDAZOLAM HCL 2 MG/2ML IJ SOLN
INTRAMUSCULAR | Status: AC
  Filled 2016-11-11: qty 4

## 2016-11-11 MED ORDER — FENTANYL CITRATE (PF) 100 MCG/2ML IJ SOLN
INTRAMUSCULAR | Status: AC
  Filled 2016-11-11: qty 4

## 2016-11-11 MED ORDER — LIDOCAINE HCL 1 % IJ SOLN
INTRAMUSCULAR | Status: AC
  Filled 2016-11-11: qty 20

## 2016-11-11 MED ORDER — FENTANYL CITRATE (PF) 100 MCG/2ML IJ SOLN
INTRAMUSCULAR | Status: AC | PRN
  Administered 2016-11-11 (×2): 50 ug via INTRAVENOUS

## 2016-11-11 MED ORDER — CEFAZOLIN SODIUM-DEXTROSE 2-4 GM/100ML-% IV SOLN
2.0000 g | INTRAVENOUS | Status: AC
  Administered 2016-11-11: 2 g via INTRAVENOUS

## 2016-11-11 MED ORDER — CEFAZOLIN SODIUM-DEXTROSE 2-4 GM/100ML-% IV SOLN
INTRAVENOUS | Status: AC
  Administered 2016-11-11: 2 g via INTRAVENOUS
  Filled 2016-11-11: qty 100

## 2016-11-11 NOTE — H&P (Signed)
Referring Physician(s): Ladell Pier  Supervising Physician: Arne Cleveland  Patient Status:  [facility] OP  Chief Complaint:  "I'm getting my port out"  Subjective: Patient familiar to IR service from prior Port-A-Cath placement on 05/14/16. He has a history of colon cancer and is status post surgery and chemotherapy. He presents today for Port-A-Cath removal. He denies fever, headache, chest pain, dyspnea, cough, abdominal/flank pain, nausea, vomiting or abnormal bleeding. Past Medical History:  Diagnosis Date  . CKD (chronic kidney disease) stage 2, GFR 60-89 ml/min   . Hernia, abdominal   . Hypertension   . Ileus ([place])   . Small bowel obstruction [facility])    Past Surgical History:  Procedure Laterality Date  . BIOPSY  04/10/2016   Procedure: BIOPSY Mesentary;  Surgeon: Judeth Horn, MD;  Location: [facility];  Service: General;;  . FLEXIBLE SIGMOIDOSCOPY N/A 04/09/2016   Procedure: FLEXIBLE SIGMOIDOSCOPY;  Surgeon: Ladene Artist, MD;  Location[facility] ENDOSCOPY;  Service: Endoscopy;  Laterality: N/A;  . HERNIA REPAIR     in childhood  . IR GENERIC HISTORICAL  05/14/2016   IR US GUIDE VASC ACCESS RIGHT 05/14/2016 Corrie Mckusick, DO [facility]-INTERV RAD  . IR GENERIC HISTORICAL  05/14/2016   IR FLUORO GUIDE PORT INSERTION RIGHT 05/14/2016 Corrie Mckusick, DO [facility]-INTERV RAD  . PARTIAL COLECTOMY N/A 04/10/2016   Procedure: SIGMOID  COLECTOMY WITH COLOSTOMY;  Surgeon: Judeth Horn, MD;  Location: [facility];  Service: General;  Laterality: N/A;  . TONSILLECTOMY         Allergies: Patient has no known allergies.  Medications: Prior to Admission medications   Medication Sig Start Date End Date Taking? Authorizing Provider  amLODipine (NORVASC) 10 MG tablet Take 10 mg by mouth daily. 02/11/16   [provider]  chlorpheniramine (CHLOR-TRIMETON) 4 MG tablet Take 4 mg by mouth 2 (two) times daily as needed for allergies.    [provider]  labetalol (NORMODYNE) 100 MG tablet Take 100 mg by  mouth 2 (two) times daily. 04/23/16   [provider]  lidocaine-prilocaine (EMLA) cream Apply 1 application topically as needed. Apply 1 hr prior to port access and cover with plastic wrap 05/12/16   Ladell Pier, MD  loperamide (IMODIUM) 2 MG capsule Take 2-4 mg by mouth daily as needed. 03/07/16   [provider]  Multiple Vitamin (MULTI-VITAMINS) TABS Take 1 tablet by mouth daily.    [provider]  omega-3 acid ethyl esters (LOVAZA) 1 g capsule Take 1 capsule by mouth daily. 03/22/16   [provider]  prochlorperazine (COMPAZINE) 10 MG tablet Take 1 tablet (10 mg total) by mouth every 6 (six) hours as needed for nausea or vomiting. Patient not taking: Reported on 09/03/2016 05/12/16   Ladell Pier, MD     Vital Signs: BP (!) 163/90 (BP Location: Right Arm)   Pulse 72   Temp 97.9 F (36.6 C) (Oral)   Resp 18   SpO2 100%   Physical Exam awake, alert. Chest clear to auscultation bilaterally. Clean, intact right chest wall Port-A-Cath. Heart with regular rate and rhythm. Abdomen soft, positive bowel sounds, intact left lower quadrant ostomy; lower extremities with trace edema bilaterally  Imaging: No results found.  Labs:  CBC:  Recent Labs  09/17/16 0837 10/02/16 1031 10/15/16 0821 10/30/16 1038  WBC 4.3 4.6 3.8* 3.7*  HGB 12.0* 12.5* 12.1* 12.7*  HCT 35.8* 37.4* 36.2* 38.4  PLT 133* 145 105* 107*    COAGS:  Recent Labs  05/14/16 1155  INR 0.94    BMP:  Recent Labs  04/12/16 0239 04/14/16 0613 04/15/16 0612 05/14/16 1155  09/17/16 0837 10/02/16 1031 10/15/16 0821 10/30/16 1038  NA 136 138 138 139  < > 141 142 141 143  K 3.8 2.9* 3.3* 4.1  < > 4.0 4.0 4.1 4.0  CL 101 101 106 108  --   --   --   --   --   CO2 26 26 20* 23  < > 24 24 25 27   GLUCOSE 169* 124* 110* 115*  < > 111 95 137 107  BUN 25* 14 12 26*  < > 30.1* 34.7* 29.9* 35.3*  CALCIUM 7.8* 8.4* 8.5* 9.1  < > 9.3 9.6 9.3 9.6  CREATININE 2.80* 2.22* 2.15*  2.34*  < > 2.3* 2.3* 2.5* 2.3*  GFRNONAA 22* 28* 30* 27*  --   --   --   --   --   GFRAA 25* 33* 34* 31*  --   --   --   --   --   < > = values in this interval not displayed.  LIVER FUNCTION TESTS:  Recent Labs  09/17/16 0837 10/02/16 1031 10/15/16 0821 10/30/16 1038  BILITOT 0.44 0.39 0.42 0.48  AST 14 14 16 17   ALT 18 15 16 18   ALKPHOS 91 93 89 101  PROT 7.0 7.3 6.8 6.9  ALBUMIN 3.6 3.8 3.5 3.8    Assessment and Plan: Pt with history of colon cancer ; status post surgery and chemotherapy. He presents today for Port-A-Cath removal. Details/risks of procedure, including but not limited to, internal bleeding, infection, injury to adjacent structures, discussed with patient with his understanding and consent. Labs pending.   Electronically Signed: D. Rowe Robert, PA-C 11/11/2016, 8:46 AM   I spent a total of 20 minutes at the the patient's bedside AND on the patient's hospital floor or unit, greater than 50% of which was counseling/coordinating care for Port-A-Cath removal

## 2016-11-11 NOTE — Discharge Instructions (Signed)
Moderate Conscious Sedation, Adult, Care After °These instructions provide you with information about caring for yourself after your procedure. Your health care provider may also give you more specific instructions. Your treatment has been planned according to current medical practices, but problems sometimes occur. Call your health care provider if you have any problems or questions after your procedure. °What can I expect after the procedure? °After your procedure, it is common: °· To feel sleepy for several hours. °· To feel clumsy and have poor balance for several hours. °· To have poor judgment for several hours. °· To vomit if you eat too soon. ° °Follow these instructions at home: °For at least 24 hours after the procedure: ° °· Do not: °? Participate in activities where you could fall or become injured. °? Drive. °? Use heavy machinery. °? Drink alcohol. °? Take sleeping pills or medicines that cause drowsiness. °? Make important decisions or sign legal documents. °? Take care of children on your own. °· Rest. °Eating and drinking °· Follow the diet recommended by your health care provider. °· If you vomit: °? Drink water, juice, or soup when you can drink without vomiting. °? Make sure you have little or no nausea before eating solid foods. °General instructions °· Have a responsible adult stay with you until you are awake and alert. °· Take over-the-counter and prescription medicines only as told by your health care provider. °· If you smoke, do not smoke without supervision. °· Keep all follow-up visits as told by your health care provider. This is important. °Contact a health care provider if: °· You keep feeling nauseous or you keep vomiting. °· You feel light-headed. °· You develop a rash. °· You have a fever. °Get help right away if: °· You have trouble breathing. °This information is not intended to replace advice given to you by your health care provider. Make sure you discuss any questions you have  with your health care provider. °Document Released: 02/09/2013 Document Revised: 09/24/2015 Document Reviewed: 08/11/2015 °Elsevier Interactive Patient Education © 2018 [employer] ° ° ° ° °Implanted Port Removal, Care After °Refer to this sheet in the next few weeks. These instructions provide you with information about caring for yourself after your procedure. Your health care provider may also give you more specific instructions. Your treatment has been planned according to current medical practices, but problems sometimes occur. Call your health care provider if you have any problems or questions after your procedure. °What can I expect after the procedure? °After the procedure, it is common to have: °· Soreness or pain near your incision. °· Some swelling or bruising near your incision. ° °Follow these instructions at home: °Medicines °· Take over-the-counter and prescription medicines only as told by your health care provider. °· If you were prescribed an antibiotic medicine, take it as told by your health care provider. Do not stop taking the antibiotic even if you start to feel better. °Bathing °· Do not take baths, swim, or use a hot tub until your health care provider approves. Ask your health care provider if you can take showers. You may only be allowed to take sponge baths for bathing. °Incision care °· Follow instructions from your health care provider about how to take care of your incision. Make sure you: °? Wash your hands with soap and water before you change your bandage (dressing). If soap and water are not available, use hand sanitizer. °? Change your dressing as told by your health care provider. °?   Keep your dressing dry. °? Leave stitches (sutures), skin glue, or adhesive strips in place. These skin closures may need to stay in place for 2 weeks or longer. If adhesive strip edges start to loosen and curl up, you may trim the loose edges. Do not remove adhesive strips completely unless your  health care provider tells you to do that. °· Check your incision area every day for signs of infection. Check for: °? More redness, swelling, or pain. °? More fluid or blood. °? Warmth. °? Pus or a bad smell. °Driving °· If you received a sedative, do not drive for 24 hours after the procedure. °· If you did not receive a sedative, ask your health care provider when it is safe to drive. °Activity °· Return to your normal activities as told by your health care provider. Ask your health care provider what activities are safe for you. °· Until your health care provider says it is safe: °? Do not lift anything that is heavier than 10 lb (4.5 kg). °? Do not do activities that involve lifting your arms over your head. °General instructions °· Do not use any tobacco products, such as cigarettes, chewing tobacco, and e-cigarettes. Tobacco can delay healing. If you need help quitting, ask your health care provider. °· Keep all follow-up visits as told by your health care provider. This is important. °Contact a health care provider if: °· You have more redness, swelling, or pain around your incision. °· You have more fluid or blood coming from your incision. °· Your incision feels warm to the touch. °· You have pus or a bad smell coming from your incision. °· You have a fever. °· You have pain that is not relieved by your pain medicine. °Get help right away if: °· You have chest pain. °· You have difficulty breathing. °This information is not intended to replace advice given to you by your health care provider. Make sure you discuss any questions you have with your health care provider. °Document Released: 04/02/2015 Document Revised: 09/27/2015 Document Reviewed: 01/24/2015 °Elsevier Interactive Patient Education © 2018 [employer] Inc[employer] ° °

## 2016-11-19 ENCOUNTER — Ambulatory Visit: Payer: Self-pay | Admitting: General Surgery

## 2016-11-20 DIAGNOSIS — E669 Obesity, unspecified: Secondary | ICD-10-CM | POA: Diagnosis not present

## 2016-11-20 DIAGNOSIS — I10 Essential (primary) hypertension: Secondary | ICD-10-CM | POA: Diagnosis not present

## 2016-11-20 DIAGNOSIS — E785 Hyperlipidemia, unspecified: Secondary | ICD-10-CM | POA: Diagnosis not present

## 2016-11-20 DIAGNOSIS — N2581 Secondary hyperparathyroidism of renal origin: Secondary | ICD-10-CM | POA: Diagnosis not present

## 2016-11-20 DIAGNOSIS — N179 Acute kidney failure, unspecified: Secondary | ICD-10-CM | POA: Diagnosis not present

## 2016-11-20 DIAGNOSIS — C187 Malignant neoplasm of sigmoid colon: Secondary | ICD-10-CM | POA: Diagnosis not present

## 2016-11-20 DIAGNOSIS — N183 Chronic kidney disease, stage 3 (moderate): Secondary | ICD-10-CM | POA: Diagnosis not present

## 2016-11-24 ENCOUNTER — Encounter: Payer: Self-pay | Admitting: *Deleted

## 2016-11-26 ENCOUNTER — Other Ambulatory Visit: Payer: Self-pay | Admitting: General Surgery

## 2016-11-26 DIAGNOSIS — Z933 Colostomy status: Secondary | ICD-10-CM

## 2016-12-03 ENCOUNTER — Other Ambulatory Visit: Payer: Medicare Other

## 2016-12-09 DIAGNOSIS — N183 Chronic kidney disease, stage 3 (moderate): Secondary | ICD-10-CM | POA: Diagnosis not present

## 2016-12-09 DIAGNOSIS — E785 Hyperlipidemia, unspecified: Secondary | ICD-10-CM | POA: Diagnosis not present

## 2016-12-15 ENCOUNTER — Ambulatory Visit
Admission: RE | Admit: 2016-12-15 | Discharge: 2016-12-15 | Disposition: A | Discharge status: Home / Self Care | Payer: Medicare Other | Source: Ambulatory Visit | Attending: General Surgery | Admitting: General Surgery

## 2016-12-15 DIAGNOSIS — Z933 Colostomy status: Secondary | ICD-10-CM

## 2016-12-17 ENCOUNTER — Ambulatory Visit: Payer: Self-pay | Admitting: General Surgery

## 2017-01-07 NOTE — Pre-Procedure Instructions (Signed)
Louis [name]  01/07/2017      [facility] - [place], [place] [address] Phone: [phone] Fax: [phone]    Your procedure is scheduled on September 10  Report to [place] at [place].M.  Call this number if you have problems the morning of surgery:  [phone]   Remember:  Continue all other medications as directed by your physician except follow these medication instructions before surgery   Continue Clear liquids the day of prep before surgery   Do not eat food or drink liquids after midnight September 9 except follow these instructions  Please complete your 8oz of Boost Breeze that was given to you at your preadmission appointment by 0330am on the day of your surgery    Take these medicines the morning of surgery with A SIP OF WATER  amLODipine (NORVASC) chlorpheniramine (CHLOR-TRIMETON) labetalol (NORMODYNE)   7 days prior to surgery STOP taking any Aspirin, Aleve, Naproxen, Ibuprofen, Motrin, Advil, Goody's, BC's, all herbal medications, fish oil, and all vitamins   Do not wear jewelry  Do not wear lotions, powders, or cologne, or deoderant.  Men may shave face and neck.  Do not bring valuables to the hospital.  [facility] is not responsible for any belongings or valuables.  Contacts, dentures or bridgework may not be worn into surgery.  Leave your suitcase in the car.  After surgery it may be brought to your room.  For patients admitted to the hospital, discharge time will be determined by your treatment team.  Patients discharged the day of surgery will not be allowed to drive home.    Special instructions:   [facility]- Preparing For Surgery  Before surgery, you can play an important role. Because skin is not sterile, your skin needs to be as free of germs as possible. You can reduce the number of germs on your skin by washing with CHG  (chlorahexidine gluconate) Soap before surgery.  CHG is an antiseptic cleaner which kills germs and bonds with the skin to continue killing germs even after washing.  Please do not use if you have an allergy to CHG or antibacterial soaps. If your skin becomes reddened/irritated stop using the CHG.  Do not shave (including legs and underarms) for at least 48 hours prior to first CHG shower. It is OK to shave your face.  Please follow these instructions carefully.   1. Shower the NIGHT BEFORE SURGERY and the MORNING OF SURGERY with CHG.   2. If you chose to wash your hair, wash your hair first as usual with your normal shampoo.  3. After you shampoo, rinse your hair and body thoroughly to remove the shampoo.  4. Use CHG as you would any other liquid soap. You can apply CHG directly to the skin and wash gently with a scrungie or a clean washcloth.   5. Apply the CHG Soap to your body ONLY FROM THE NECK DOWN.  Do not use on open wounds or open sores. Avoid contact with your eyes, ears, mouth and genitals (private parts). Wash genitals (private parts) with your normal soap.  6. Wash thoroughly, paying special attention to the area where your surgery will be performed.  7. Thoroughly rinse your body with warm water from the neck down.  8. DO NOT shower/wash with your normal soap after using and rinsing off the CHG Soap.  9. Pat yourself dry with a CLEAN TOWEL.  10. Wear CLEAN PAJAMAS   11. Place CLEAN SHEETS on your bed the night of your first shower and DO NOT SLEEP WITH PETS.    Day of Surgery: Do not apply any deodorants/lotions. Please wear clean clothes to the hospital/surgery center.      Please read over the following fact sheets that you were given.

## 2017-01-08 ENCOUNTER — Encounter (HOSPITAL_COMMUNITY): Payer: Self-pay

## 2017-01-08 ENCOUNTER — Encounter (HOSPITAL_COMMUNITY)
Admission: RE | Admit: 2017-01-08 | Discharge: 2017-01-08 | Disposition: A | Discharge status: Home / Self Care | Payer: Medicare Other | Source: Ambulatory Visit | Attending: General Surgery | Admitting: General Surgery

## 2017-01-08 DIAGNOSIS — Z01818 Encounter for other preprocedural examination: Secondary | ICD-10-CM | POA: Diagnosis not present

## 2017-01-08 DIAGNOSIS — Z933 Colostomy status: Secondary | ICD-10-CM | POA: Diagnosis not present

## 2017-01-08 HISTORY — DX: Unspecified osteoarthritis, unspecified site: M19.90

## 2017-01-08 HISTORY — DX: Cardiac murmur, unspecified: R01.1

## 2017-01-08 HISTORY — DX: Malignant (primary) neoplasm, unspecified: C80.1

## 2017-01-08 LAB — COMPREHENSIVE METABOLIC PANEL
ALT: 20 U/L (ref 17–63)
AST: 20 U/L (ref 15–41)
Albumin: 3.9 g/dL (ref 3.5–5.0)
Alkaline Phosphatase: 65 U/L (ref 38–126)
Anion gap: 7 (ref 5–15)
BUN: 32 mg/dL — ABNORMAL HIGH (ref 6–20)
CHLORIDE: 109 mmol/L (ref 101–111)
CO2: 24 mmol/L (ref 22–32)
CREATININE: 2.32 mg/dL — AB (ref 0.61–1.24)
Calcium: 9.5 mg/dL (ref 8.9–10.3)
GFR, EST AFRICAN AMERICAN: 31 mL/min — AB (ref >60)
GFR, EST NON AFRICAN AMERICAN: 27 mL/min — AB (ref >60)
Glucose, Bld: 110 mg/dL — ABNORMAL HIGH (ref 65–99)
POTASSIUM: 4.2 mmol/L (ref 3.5–5.1)
Sodium: 140 mmol/L (ref 135–145)
Total Bilirubin: 0.6 mg/dL (ref 0.3–1.2)
Total Protein: 7 g/dL (ref 6.5–8.1)

## 2017-01-08 LAB — CBC WITH DIFFERENTIAL/PLATELET
Basophils Absolute: 0.1 10*3/uL (ref 0.0–0.1)
Basophils Relative: 1 %
EOS PCT: 9 %
Eosinophils Absolute: 0.5 10*3/uL (ref 0.0–0.7)
HCT: 42.2 % (ref 39.0–52.0)
Hemoglobin: 14 g/dL (ref 13.0–17.0)
LYMPHS ABS: 1.2 10*3/uL (ref 0.7–4.0)
LYMPHS PCT: 20 %
MCH: 29.7 pg (ref 26.0–34.0)
MCHC: 33.2 g/dL (ref 30.0–36.0)
MCV: 89.4 fL (ref 78.0–100.0)
MONO ABS: 0.7 10*3/uL (ref 0.1–1.0)
MONOS PCT: 12 %
Neutro Abs: 3.6 10*3/uL (ref 1.7–7.7)
Neutrophils Relative %: 58 %
PLATELETS: 158 10*3/uL (ref 150–400)
RBC: 4.72 MIL/uL (ref 4.22–5.81)
RDW: 14.5 % (ref 11.5–15.5)
WBC: 6.1 10*3/uL (ref 4.0–10.5)

## 2017-01-08 LAB — HEMOGLOBIN A1C
HEMOGLOBIN A1C: 5.7 % — AB (ref 4.8–5.6)
MEAN PLASMA GLUCOSE: 116.89 mg/dL

## 2017-01-09 NOTE — Progress Notes (Signed)
Anesthesia Chart Review:  Pt is a 71 year old male scheduled for colostomy reversal on 01/12/2017 with Judeth Horn, MD  - Nephrologist is Donato Heinz, MD  PMH includes:  HTN, CKD (stage III), colon cancer. Never smoker. BMI 28. S/p sigmoid colectomy with colostomy 04/10/16.   Medications include: Amlodipine, labetalol  Preoperative labs reviewed.   - HbA1c 5.7, glucose 110  - Cr 2.32, BUN 32.  These results consistent with prior labs. Dr. Elissa Hefty notes indicate pt's baseline Cr has been 1.8-2.4 since 2004  CXR 07/16/16:  1. Small bilateral pleural effusions. 2. Thoracic degenerative disc disease.  EKG 01/08/17: NSR  If no changes, I anticipate pt can proceed with surgery as scheduled.   Willeen Cass, FNP-BC [facility]/Anesthesiology Phone: [phone] 01/09/2017 12:16 PM

## 2017-01-11 MED ORDER — CEFOTETAN DISODIUM-DEXTROSE 2-2.08 GM-% IV SOLR
2.0000 g | INTRAVENOUS | Status: AC
  Administered 2017-01-12: 2 g via INTRAVENOUS
  Filled 2017-01-11: qty 50

## 2017-01-11 NOTE — H&P (Signed)
Louis [name] is an 71 y.o. male.   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  He had local spread with implants at that time also.  He underwent chemotherapy, and is now in the position where he needs to be reversed.  Past Medical History:  Diagnosis Date  . Arthritis   . Cancer ([place])    colon  . CKD (chronic kidney disease) stage 2, GFR 60-89 ml/min   . Heart murmur    many years   . Hernia, abdominal   . Hypertension   . Ileus ([place])   . Small bowel obstruction [facility])     Past Surgical History:  Procedure Laterality Date  . BIOPSY  04/10/2016   Procedure: BIOPSY Mesentary;  Surgeon: Louis Horn, MD;  Location: [place];  Service: General;;  . COLOSTOMY    . FLEXIBLE SIGMOIDOSCOPY N/A 04/09/2016   Procedure: FLEXIBLE SIGMOIDOSCOPY;  Surgeon: Louis Artist, MD;  Location: [facility] ENDOSCOPY;  Service: Endoscopy;  Laterality: N/A;  . HERNIA REPAIR     in childhood  . IR GENERIC HISTORICAL  05/14/2016   IR US GUIDE VASC ACCESS RIGHT 05/14/2016 Louis Mckusick, DO [facility]-INTERV RAD  . IR GENERIC HISTORICAL  05/14/2016   IR FLUORO GUIDE PORT INSERTION RIGHT 05/14/2016 Louis Mckusick, DO [facility]-INTERV RAD  . IR REMOVAL TUN ACCESS W/ PORT W/O FL MOD SED  11/11/2016  . PARTIAL COLECTOMY N/A 04/10/2016   Procedure: SIGMOID  COLECTOMY WITH COLOSTOMY;  Surgeon: Louis Horn, MD;  Location: [place];  Service: General;  Laterality: N/A;  . TONSILLECTOMY      No family history on file. Social History:  reports that he has never smoked. He has never used smokeless tobacco. He reports that he drinks alcohol. He reports that he does not use drugs.  Allergies: No Known Allergies  No prescriptions prior to admission.    No results found for this or any previous visit (from the past 48 hour(s)). No results found.  Review of Systems  Constitutional: Negative.   HENT: Negative.   Eyes: Negative.     Respiratory: Negative.   Cardiovascular: Negative.   Gastrointestinal:       LUQ colostomy  Genitourinary: Negative.   Musculoskeletal: Negative.   Skin: Negative.   All other systems reviewed and are negative.   There were no vitals taken for this visit. Physical Exam  Constitutional: He is oriented to person, place, and time. He appears well-developed and well-nourished.  HENT:  Head: Normocephalic and atraumatic.  Cardiovascular: Normal rate, regular rhythm and normal heart sounds.   EKG normal  Respiratory: Effort normal and breath sounds normal.  GI: Soft. Normal appearance and bowel sounds are normal. There is no tenderness.    Genitourinary: Penis normal.  Musculoskeletal: Normal range of motion.  Neurological: He is alert and oriented to person, place, and time. He has normal reflexes.  Skin: Skin is warm.     Assessment/Plan Colostomy in place for reversal. Entereg given.   Louis Horn, MD 01/11/2017, 7:29 PM   Office Note  Louis [name] 11/19/2016 3:57 PM Location: [facility] Patient #: [id number] DOB: June 27, 1945 Married / Language: English / Race: White Male   The patient is a 71 year old male.  Allergies Louis [name], [place]; 11/19/2016 3:58 PM) No Known Drug Allergies 05/06/2016  Medication History Louis [name], [place]; 11/19/2016 3:59 PM) Loperamide HCl (2MG  Capsule, Oral)  Active. Multi Vitamin Daily (Oral) Active. AmLODIPine Besylate (10MG  Tablet, Oral as needed) Active. Omega-3-acid Ethyl Esters (1GM Capsule, Oral as needed) Active. Labetalol HCl (300MG  Tablet, Oral two times daily) Active. Medications Reconciled  Vitals Louis [name] RMA; 11/19/2016 3:59 PM) 11/19/2016 3:59 PM Weight: 232.4 lb Height: 76in Body Surface Area: 2.36 m Body Mass Index: 28.29 kg/m  Temp.: 98.67F  Pulse: 84 (Regular)  BP: 164/90 (Sitting, Left Arm, Standard)  Physical Exam (Louis Fantroy O. Hulen Skains MD; 11/19/2016 4:59  PM) Abdomen Note: Doing well after chemotherapy. Wants colostomy reversed  Assessment & Plan Louis Rinks O. Trula Frede MD; 11/19/2016 5:01 PM) STATUS POST COLOSTOMY (Z93.3) Story: Status post colectomy and colostomy for obstructing cancer Impression: Wants reversal and he has finished chemotherapy and port removed. Will give bowel prep and try to get gastrograffin study for anatomy of distal remnant. Surgery by the end of August.

## 2017-01-12 ENCOUNTER — Inpatient Hospital Stay (HOSPITAL_COMMUNITY)
Admission: RE | Admit: 2017-01-12 | Discharge: 2017-01-16 | DRG: 331 | Disposition: A | Discharge status: Home Health | Payer: Medicare Other | Source: Ambulatory Visit | Attending: General Surgery | Admitting: General Surgery

## 2017-01-12 ENCOUNTER — Encounter (HOSPITAL_COMMUNITY)
Admission: RE | Disposition: A | Discharge status: Home Health | Payer: Self-pay | Source: Ambulatory Visit | Attending: General Surgery

## 2017-01-12 ENCOUNTER — Inpatient Hospital Stay (HOSPITAL_COMMUNITY): Payer: Medicare Other | Admitting: Anesthesiology

## 2017-01-12 ENCOUNTER — Encounter (HOSPITAL_COMMUNITY): Payer: Self-pay

## 2017-01-12 ENCOUNTER — Inpatient Hospital Stay (HOSPITAL_COMMUNITY): Payer: Medicare Other | Admitting: Emergency Medicine

## 2017-01-12 DIAGNOSIS — Z433 Encounter for attention to colostomy: Secondary | ICD-10-CM | POA: Diagnosis not present

## 2017-01-12 DIAGNOSIS — N182 Chronic kidney disease, stage 2 (mild): Secondary | ICD-10-CM | POA: Diagnosis present

## 2017-01-12 DIAGNOSIS — Z9221 Personal history of antineoplastic chemotherapy: Secondary | ICD-10-CM | POA: Diagnosis not present

## 2017-01-12 DIAGNOSIS — Z9049 Acquired absence of other specified parts of digestive tract: Secondary | ICD-10-CM

## 2017-01-12 DIAGNOSIS — C189 Malignant neoplasm of colon, unspecified: Secondary | ICD-10-CM | POA: Diagnosis not present

## 2017-01-12 DIAGNOSIS — I739 Peripheral vascular disease, unspecified: Secondary | ICD-10-CM | POA: Diagnosis not present

## 2017-01-12 DIAGNOSIS — I10 Essential (primary) hypertension: Secondary | ICD-10-CM | POA: Diagnosis not present

## 2017-01-12 DIAGNOSIS — I129 Hypertensive chronic kidney disease with stage 1 through stage 4 chronic kidney disease, or unspecified chronic kidney disease: Secondary | ICD-10-CM | POA: Diagnosis not present

## 2017-01-12 DIAGNOSIS — Z933 Colostomy status: Secondary | ICD-10-CM

## 2017-01-12 DIAGNOSIS — Z85038 Personal history of other malignant neoplasm of large intestine: Secondary | ICD-10-CM

## 2017-01-12 DIAGNOSIS — K4090 Unilateral inguinal hernia, without obstruction or gangrene, not specified as recurrent: Secondary | ICD-10-CM | POA: Diagnosis not present

## 2017-01-12 HISTORY — PX: COLOSTOMY REVERSAL: SHX5782

## 2017-01-12 SURGERY — COLOSTOMY REVERSAL
Anesthesia: General | Site: Abdomen

## 2017-01-12 MED ORDER — ONDANSETRON HCL 4 MG/2ML IJ SOLN: 4.0000 mg | Freq: Four times a day (QID) | INTRAMUSCULAR | Status: DC | PRN

## 2017-01-12 MED ORDER — CEFOTETAN DISODIUM 2 G IJ SOLR
2.0000 g | Freq: Two times a day (BID) | INTRAMUSCULAR | Status: DC
  Filled 2017-01-12: qty 2

## 2017-01-12 MED ORDER — METOCLOPRAMIDE HCL 5 MG/ML IJ SOLN: 10.0000 mg | Freq: Once | INTRAMUSCULAR | Status: DC | PRN

## 2017-01-12 MED ORDER — DIPHENHYDRAMINE HCL 50 MG/ML IJ SOLN: 12.5000 mg | Freq: Four times a day (QID) | INTRAMUSCULAR | Status: DC | PRN

## 2017-01-12 MED ORDER — LACTATED RINGERS IV SOLN
INTRAVENOUS | Status: DC | PRN
  Administered 2017-01-12 (×2): via INTRAVENOUS

## 2017-01-12 MED ORDER — GABAPENTIN 300 MG PO CAPS
300.0000 mg | ORAL_CAPSULE | Freq: Two times a day (BID) | ORAL | Status: DC
  Administered 2017-01-12 – 2017-01-15 (×8): 300 mg via ORAL
  Filled 2017-01-12 (×8): qty 1

## 2017-01-12 MED ORDER — HYDROMORPHONE 1 MG/ML IV SOLN
INTRAVENOUS | Status: AC
  Filled 2017-01-12: qty 25

## 2017-01-12 MED ORDER — ACETAMINOPHEN 500 MG PO TABS
1000.0000 mg | ORAL_TABLET | ORAL | Status: AC
  Administered 2017-01-12: 1000 mg via ORAL

## 2017-01-12 MED ORDER — AMLODIPINE BESYLATE 10 MG PO TABS
10.0000 mg | ORAL_TABLET | Freq: Every day | ORAL | Status: DC
  Administered 2017-01-13 – 2017-01-15 (×3): 10 mg via ORAL
  Filled 2017-01-12 (×3): qty 1

## 2017-01-12 MED ORDER — POLYETHYLENE GLYCOL 3350 17 GM/SCOOP PO POWD: 1.0000 | Freq: Once | ORAL | Status: DC

## 2017-01-12 MED ORDER — ALVIMOPAN 12 MG PO CAPS
12.0000 mg | ORAL_CAPSULE | ORAL | Status: AC
  Administered 2017-01-12: 12 mg via ORAL

## 2017-01-12 MED ORDER — ONDANSETRON HCL 4 MG/2ML IJ SOLN
INTRAMUSCULAR | Status: AC
  Filled 2017-01-12: qty 2

## 2017-01-12 MED ORDER — LABETALOL HCL 100 MG PO TABS
100.0000 mg | ORAL_TABLET | Freq: Two times a day (BID) | ORAL | Status: DC
  Administered 2017-01-12 – 2017-01-15 (×7): 100 mg via ORAL
  Filled 2017-01-12 (×7): qty 1

## 2017-01-12 MED ORDER — ALVIMOPAN 12 MG PO CAPS
ORAL_CAPSULE | ORAL | Status: AC
  Administered 2017-01-12: 12 mg via ORAL
  Filled 2017-01-12: qty 1

## 2017-01-12 MED ORDER — SODIUM CHLORIDE 0.9% FLUSH: 9.0000 mL | INTRAVENOUS | Status: DC | PRN

## 2017-01-12 MED ORDER — PROPOFOL 10 MG/ML IV BOLUS
INTRAVENOUS | Status: AC
  Filled 2017-01-12: qty 20

## 2017-01-12 MED ORDER — LIDOCAINE HCL (CARDIAC) 20 MG/ML IV SOLN
INTRAVENOUS | Status: DC | PRN
  Administered 2017-01-12: 100 mg via INTRAVENOUS

## 2017-01-12 MED ORDER — MEPERIDINE HCL 25 MG/ML IJ SOLN: 6.2500 mg | INTRAMUSCULAR | Status: DC | PRN

## 2017-01-12 MED ORDER — ROCURONIUM BROMIDE 10 MG/ML (PF) SYRINGE
PREFILLED_SYRINGE | INTRAVENOUS | Status: AC
  Filled 2017-01-12: qty 5

## 2017-01-12 MED ORDER — METRONIDAZOLE 500 MG PO TABS: 1000.0000 mg | ORAL_TABLET | ORAL | Status: DC

## 2017-01-12 MED ORDER — OMEGA-3-ACID ETHYL ESTERS 1 G PO CAPS: 1.0000 | ORAL_CAPSULE | Freq: Every day | ORAL | Status: DC | PRN

## 2017-01-12 MED ORDER — CHLORHEXIDINE GLUCONATE CLOTH 2 % EX PADS: 6.0000 | MEDICATED_PAD | Freq: Once | CUTANEOUS | Status: DC

## 2017-01-12 MED ORDER — FENTANYL CITRATE (PF) 250 MCG/5ML IJ SOLN
INTRAMUSCULAR | Status: AC
Start: 2017-01-12 — End: 2017-01-12
  Filled 2017-01-12: qty 5

## 2017-01-12 MED ORDER — NEOSTIGMINE METHYLSULFATE 5 MG/5ML IV SOSY
PREFILLED_SYRINGE | INTRAVENOUS | Status: AC
  Filled 2017-01-12: qty 5

## 2017-01-12 MED ORDER — ACETAMINOPHEN 160 MG/5ML PO SOLN
1000.0000 mg | Freq: Three times a day (TID) | ORAL | Status: AC
  Administered 2017-01-12 – 2017-01-13 (×2): 1000 mg via ORAL
  Filled 2017-01-12 (×2): qty 40.6

## 2017-01-12 MED ORDER — HYDROMORPHONE 1 MG/ML IV SOLN
INTRAVENOUS | Status: DC
  Administered 2017-01-12: 10:00:00 via INTRAVENOUS
  Administered 2017-01-12: 0.2 mg via INTRAVENOUS
  Administered 2017-01-13 (×2): 0.4 mg via INTRAVENOUS
  Administered 2017-01-13: 1.2 mg via INTRAVENOUS
  Administered 2017-01-13: 0.2 mg via INTRAVENOUS
  Administered 2017-01-14 (×2): 0.4 mg via INTRAVENOUS

## 2017-01-12 MED ORDER — NALOXONE HCL 0.4 MG/ML IJ SOLN: 0.4000 mg | INTRAMUSCULAR | Status: DC | PRN

## 2017-01-12 MED ORDER — 0.9 % SODIUM CHLORIDE (POUR BTL) OPTIME
TOPICAL | Status: DC | PRN
  Administered 2017-01-12 (×2): 1000 mL

## 2017-01-12 MED ORDER — POVIDONE-IODINE 10 % EX OINT
TOPICAL_OINTMENT | CUTANEOUS | Status: DC | PRN
  Administered 2017-01-12: 1 via TOPICAL

## 2017-01-12 MED ORDER — NEOSTIGMINE METHYLSULFATE 10 MG/10ML IV SOLN
INTRAVENOUS | Status: DC | PRN
  Administered 2017-01-12: 3 mg via INTRAVENOUS

## 2017-01-12 MED ORDER — ACETAMINOPHEN 500 MG PO TABS
1000.0000 mg | ORAL_TABLET | Freq: Three times a day (TID) | ORAL | Status: DC
  Administered 2017-01-12: 1000 mg via ORAL
  Filled 2017-01-12: qty 2

## 2017-01-12 MED ORDER — ACETAMINOPHEN 500 MG PO TABS
ORAL_TABLET | ORAL | Status: AC
  Administered 2017-01-12: 1000 mg via ORAL
  Filled 2017-01-12: qty 2

## 2017-01-12 MED ORDER — DEXAMETHASONE SODIUM PHOSPHATE 10 MG/ML IJ SOLN
INTRAMUSCULAR | Status: AC
  Filled 2017-01-12: qty 1

## 2017-01-12 MED ORDER — ENOXAPARIN SODIUM 40 MG/0.4ML ~~LOC~~ SOLN
40.0000 mg | SUBCUTANEOUS | Status: DC
  Administered 2017-01-13 – 2017-01-15 (×3): 40 mg via SUBCUTANEOUS
  Filled 2017-01-12 (×3): qty 0.4

## 2017-01-12 MED ORDER — GABAPENTIN 300 MG PO CAPS
ORAL_CAPSULE | ORAL | Status: AC
  Administered 2017-01-12: 300 mg via ORAL
  Filled 2017-01-12: qty 1

## 2017-01-12 MED ORDER — GABAPENTIN 300 MG PO CAPS
300.0000 mg | ORAL_CAPSULE | ORAL | Status: AC
  Administered 2017-01-12: 300 mg via ORAL

## 2017-01-12 MED ORDER — PROPOFOL 10 MG/ML IV BOLUS
INTRAVENOUS | Status: DC | PRN
  Administered 2017-01-12: 200 mg via INTRAVENOUS

## 2017-01-12 MED ORDER — POVIDONE-IODINE 10 % EX OINT
TOPICAL_OINTMENT | CUTANEOUS | Status: AC
  Filled 2017-01-12: qty 28.35

## 2017-01-12 MED ORDER — DIPHENHYDRAMINE HCL 12.5 MG/5ML PO ELIX: 12.5000 mg | ORAL_SOLUTION | Freq: Four times a day (QID) | ORAL | Status: DC | PRN

## 2017-01-12 MED ORDER — DEXTROSE 5 % IV SOLN
2.0000 g | Freq: Two times a day (BID) | INTRAVENOUS | Status: AC
  Administered 2017-01-12: 2 g via INTRAVENOUS
  Filled 2017-01-12: qty 2

## 2017-01-12 MED ORDER — GLYCOPYRROLATE 0.2 MG/ML IJ SOLN
INTRAMUSCULAR | Status: DC | PRN
  Administered 2017-01-12: 0.4 mg via INTRAVENOUS

## 2017-01-12 MED ORDER — FENTANYL CITRATE (PF) 100 MCG/2ML IJ SOLN: 25.0000 ug | INTRAMUSCULAR | Status: DC | PRN

## 2017-01-12 MED ORDER — ALVIMOPAN 12 MG PO CAPS
12.0000 mg | ORAL_CAPSULE | Freq: Two times a day (BID) | ORAL | Status: DC
  Administered 2017-01-13 – 2017-01-14 (×3): 12 mg via ORAL
  Filled 2017-01-12 (×3): qty 1

## 2017-01-12 MED ORDER — EPHEDRINE SULFATE 50 MG/ML IJ SOLN
INTRAMUSCULAR | Status: DC | PRN
  Administered 2017-01-12 (×2): 10 mg via INTRAVENOUS

## 2017-01-12 MED ORDER — NEOMYCIN SULFATE 500 MG PO TABS: 1000.0000 mg | ORAL_TABLET | ORAL | Status: DC

## 2017-01-12 MED ORDER — ONDANSETRON HCL 4 MG/2ML IJ SOLN
INTRAMUSCULAR | Status: DC | PRN
  Administered 2017-01-12: 4 mg via INTRAVENOUS

## 2017-01-12 MED ORDER — DEXAMETHASONE SODIUM PHOSPHATE 10 MG/ML IJ SOLN
INTRAMUSCULAR | Status: DC | PRN
  Administered 2017-01-12: 10 mg via INTRAVENOUS

## 2017-01-12 MED ORDER — LIDOCAINE 2% (20 MG/ML) 5 ML SYRINGE
INTRAMUSCULAR | Status: AC
  Filled 2017-01-12: qty 5

## 2017-01-12 MED ORDER — ROCURONIUM BROMIDE 100 MG/10ML IV SOLN
INTRAVENOUS | Status: DC | PRN
  Administered 2017-01-12: 50 mg via INTRAVENOUS

## 2017-01-12 MED ORDER — EPHEDRINE 5 MG/ML INJ
INTRAVENOUS | Status: AC
  Filled 2017-01-12: qty 10

## 2017-01-12 MED ORDER — FENTANYL CITRATE (PF) 100 MCG/2ML IJ SOLN
INTRAMUSCULAR | Status: DC | PRN
  Administered 2017-01-12: 50 ug via INTRAVENOUS
  Administered 2017-01-12: 100 ug via INTRAVENOUS
  Administered 2017-01-12 (×2): 50 ug via INTRAVENOUS

## 2017-01-12 SURGICAL SUPPLY — 60 items
BLADE CLIPPER SURG (BLADE) ×3 IMPLANT
CANISTER SUCT 3000ML PPV (MISCELLANEOUS) ×3 IMPLANT
CHLORAPREP W/TINT 26ML (MISCELLANEOUS) ×3 IMPLANT
COVER MAYO STAND STRL (DRAPES) ×6 IMPLANT
COVER SURGICAL LIGHT HANDLE (MISCELLANEOUS) ×6 IMPLANT
DRAIN PENROSE 1/4X12 LTX STRL (WOUND CARE) ×3 IMPLANT
DRAPE HALF SHEET 40X57 (DRAPES) ×3 IMPLANT
DRAPE LAPAROSCOPIC ABDOMINAL (DRAPES) ×3 IMPLANT
DRAPE UTILITY XL STRL (DRAPES) ×3 IMPLANT
DRAPE WARM FLUID 44X44 (DRAPE) ×3 IMPLANT
DRSG OPSITE POSTOP 4X10 (GAUZE/BANDAGES/DRESSINGS) IMPLANT
DRSG OPSITE POSTOP 4X8 (GAUZE/BANDAGES/DRESSINGS) ×3 IMPLANT
DRSG TEGADERM 4X4.75 (GAUZE/BANDAGES/DRESSINGS) ×3 IMPLANT
ELECT BLADE 6.5 EXT (BLADE) ×3 IMPLANT
ELECT CAUTERY BLADE 6.4 (BLADE) ×6 IMPLANT
ELECT REM PT RETURN 9FT ADLT (ELECTROSURGICAL) ×3
ELECTRODE REM PT RTRN 9FT ADLT (ELECTROSURGICAL) ×1 IMPLANT
GAUZE SPONGE 4X4 12PLY STRL (GAUZE/BANDAGES/DRESSINGS) ×3 IMPLANT
GLOVE BIO SURGEON STRL SZ7 (GLOVE) ×3 IMPLANT
GLOVE BIOGEL PI IND STRL 7.0 (GLOVE) ×1 IMPLANT
GLOVE BIOGEL PI IND STRL 7.5 (GLOVE) ×2 IMPLANT
GLOVE BIOGEL PI IND STRL 8 (GLOVE) ×4 IMPLANT
GLOVE BIOGEL PI INDICATOR 7.0 (GLOVE) ×2
GLOVE BIOGEL PI INDICATOR 7.5 (GLOVE) ×4
GLOVE BIOGEL PI INDICATOR 8 (GLOVE) ×8
GLOVE ECLIPSE 7.5 STRL STRAW (GLOVE) ×12 IMPLANT
GOWN STRL REUS W/ TWL LRG LVL3 (GOWN DISPOSABLE) ×4 IMPLANT
GOWN STRL REUS W/ TWL XL LVL3 (GOWN DISPOSABLE) ×2 IMPLANT
GOWN STRL REUS W/TWL LRG LVL3 (GOWN DISPOSABLE) ×8
GOWN STRL REUS W/TWL XL LVL3 (GOWN DISPOSABLE) ×4
KIT BASIN OR (CUSTOM PROCEDURE TRAY) ×3 IMPLANT
KIT ROOM TURNOVER OR (KITS) ×3 IMPLANT
LEGGING LITHOTOMY PAIR STRL (DRAPES) ×3 IMPLANT
LIGASURE IMPACT 36 18CM CVD LR (INSTRUMENTS) IMPLANT
NS IRRIG 1000ML POUR BTL (IV SOLUTION) ×6 IMPLANT
PACK GENERAL/GYN (CUSTOM PROCEDURE TRAY) ×3 IMPLANT
PAD ARMBOARD 7.5X6 YLW CONV (MISCELLANEOUS) ×3 IMPLANT
PENCIL BUTTON HOLSTER BLD 10FT (ELECTRODE) ×3 IMPLANT
RELOAD PROXIMATE 75MM BLUE (ENDOMECHANICALS) ×3 IMPLANT
SPECIMEN JAR MEDIUM (MISCELLANEOUS) ×3 IMPLANT
SPONGE LAP 18X18 X RAY DECT (DISPOSABLE) ×6 IMPLANT
STAPLER GUN LINEAR PROX 60 (STAPLE) ×3 IMPLANT
STAPLER PROXIMATE 75MM BLUE (STAPLE) ×3 IMPLANT
STAPLER VISISTAT 35W (STAPLE) ×3 IMPLANT
SUCTION POOLE TIP (SUCTIONS) ×3 IMPLANT
SURGILUBE 2OZ TUBE FLIPTOP (MISCELLANEOUS) IMPLANT
SUT NOVA NAB DX-16 0-1 5-0 T12 (SUTURE) ×6 IMPLANT
SUT PDS AB 1 TP1 96 (SUTURE) ×6 IMPLANT
SUT SILK 2 0 SH (SUTURE) ×3 IMPLANT
SUT SILK 2 0 SH CR/8 (SUTURE) ×3 IMPLANT
SUT SILK 2 0 TIES 10X30 (SUTURE) ×3 IMPLANT
SUT SILK 3 0 SH CR/8 (SUTURE) ×3 IMPLANT
SUT SILK 3 0 TIES 10X30 (SUTURE) ×3 IMPLANT
SYR BULB IRRIGATION 50ML (SYRINGE) ×3 IMPLANT
TOWEL OR 17X26 10 PK STRL BLUE (TOWEL DISPOSABLE) ×3 IMPLANT
TRAY FOLEY W/METER SILVER 14FR (SET/KITS/TRAYS/PACK) ×3 IMPLANT
TRAY PROCTOSCOPIC FIBER OPTIC (SET/KITS/TRAYS/PACK) IMPLANT
TUBE CONNECTING 12'X1/4 (SUCTIONS) ×1
TUBE CONNECTING 12X1/4 (SUCTIONS) ×2 IMPLANT
YANKAUER SUCT BULB TIP NO VENT (SUCTIONS) ×3 IMPLANT

## 2017-01-12 NOTE — Progress Notes (Signed)
Patient arrived to 6n19 alert and oriented, mild pain to mid abdomen, assessed surgical site on abdomen with PACU nurse, one honeycomb dressing with slight yellow/brown drainage and gauze dressing to right of midline with one half of dressing soaked with pink drainage and marked. PCA and IV fluids infusing, on 2L o2, foley cath in place,VSS. Offered patient clear liquid options, only wanted ice chips. Oriented to room and staff, family in room, will continue to monitor.

## 2017-01-12 NOTE — Anesthesia Preprocedure Evaluation (Signed)
Anesthesia Evaluation  Patient identified by MRN, date of birth, ID band Patient awake    Reviewed: Allergy & Precautions, H&P , NPO status , Patient's Chart, lab work & pertinent test results  Airway Mallampati: II  TM Distance: >3 FB Neck ROM: full    Dental no notable dental hx.    Pulmonary neg pulmonary ROS,    Pulmonary exam normal breath sounds clear to auscultation       Cardiovascular hypertension, Pt. on medications + Peripheral Vascular Disease  Normal cardiovascular exam Rhythm:regular Rate:Normal     Neuro/Psych negative neurological ROS  negative psych ROS   GI/Hepatic negative GI ROS, Neg liver ROS,   Endo/Other  negative endocrine ROS  Renal/GU Renal InsufficiencyRenal disease  negative genitourinary   Musculoskeletal negative musculoskeletal ROS (+)   Abdominal   Peds negative pediatric ROS (+)  Hematology negative hematology ROS (+)   Anesthesia Other Findings   Reproductive/Obstetrics negative OB ROS                             Anesthesia Physical  Anesthesia Plan  ASA: II  Anesthesia Plan: General   Post-op Pain Management:    Induction: Intravenous  PONV Risk Score and Plan: 1 and Ondansetron, Dexamethasone and Treatment may vary due to age or medical condition  Airway Management Planned: Oral ETT  Additional Equipment:   Intra-op Plan:   Post-operative Plan: Extubation in OR  Informed Consent: I have reviewed the patients History and Physical, chart, labs and discussed the procedure including the risks, benefits and alternatives for the proposed anesthesia with the patient or authorized representative who has indicated his/her understanding and acceptance.     Plan Discussed with: CRNA, Anesthesiologist and Surgeon  Anesthesia Plan Comments:         Anesthesia Quick Evaluation

## 2017-01-12 NOTE — Transfer of Care (Signed)
Immediate Anesthesia Transfer of Care Note  Patient: Louis [name]  Procedure(s) Performed: Procedure(s): COLOSTOMY REVERSAL (N/A)  Patient Location: PACU  Anesthesia Type:General  Level of Consciousness: awake, alert , oriented and patient cooperative  Airway & Oxygen Therapy: Patient Spontanous Breathing and Patient connected to nasal cannula oxygen  Post-op Assessment: Report given to RN and Post -op Vital signs reviewed and stable  Post vital signs: Reviewed and stable  Last Vitals:  Vitals:   01/12/17 0600 01/12/17 0929  BP: (!) 171/83 (!) 151/86  Pulse: 75 76  Resp: 20 15  Temp: 36.8 C 36.7 C  SpO2: 98% 96%    Last Pain: There were no vitals filed for this visit.       Complications: No apparent anesthesia complications

## 2017-01-12 NOTE — Progress Notes (Signed)
Notified patient that he had orders to advance diet and I offered to advance to full liquids but patient would like to stay on clear liquids through today. Will continue to monitor.

## 2017-01-12 NOTE — Op Note (Signed)
OPERATIVE REPORT  DATE OF OPERATION: 01/12/2017  PATIENT:  Louis [name]  71 y.o. male  PRE-OPERATIVE DIAGNOSIS:  COLOSTOMY IN PLACE  POST-OPERATIVE DIAGNOSIS:  COLOSTOMY IN PLACE  INDICATION(S) FOR OPERATION:  Colostomy in place after abstructing colon cancer  FINDINGS:  Minimal adhesions.  Long distal stump.  No evidence of recurrent or metastatic disease.  PROCEDURE:  Procedure(s): COLOSTOMY REVERSAL  SURGEON:  Surgeon(s): Judeth Horn, MD Excell Seltzer, MD  ASSISTANT: Excell Seltzer, MD  ANESTHESIA:   general  COMPLICATIONS:  None  EBL: <20 ml  BLOOD ADMINISTERED: none  DRAINS: Urinary Catheter (Foley)   SPECIMEN:  Source of Specimen:  Distal portion of descending colon  COUNTS CORRECT:  YES  PROCEDURE DETAILS: The patient was taken to the operating room and placed on the table in the supine position. After an adequate general endotracheal anesthetic was administered he was prepped and draped in usual sterile manner exposing his abdomen. However this was after a proper timeout was performed identifying the patient and procedure to be performed. The colostomy in the left upper quadrant was closed using running locking stitch of 2-0 silk. After that the preparation was performed.  It was felt as though the patient may have needed lithotomy and therefore he was placed in stirrups but not propped up for the initial portion of the operation as it was felt as though we could make the reconnection without utilizing the lithotomy position.  A midline incision was made down to and through the midline fascia. We entered the peritoneal cavity safely and opened the fascia distally and proximally. The entire incision length was approximately 12-13 cm long.  There was minimal difficulty localizing the long distal portion of the colon that came up to the upper portion of the abdomen on the left side. It had been marked with a Prolene suture.  We subsequently took down the  colostomy from the left upper quadrant abdominal wall using electrocautery to detach it from the skin margins. There were minimal adhesions attached to the colostomy we were able to bring it easily into the peritoneal cavity. Once this was done a GIA-75 stapler was used to remove the distal portion of the descending colon and sent as a specimen. This included the colostomy with the attached skin.  We subsequently performed a side-to-side, effective end-to-end anastomosis between the proximal descending colon and the distal descending colon using a GIA-75 stapler. Care was taken to isolate the skin and the abdominal cavity from the open colotomies which were made for the anastomosis. The resulting expected enterotomy was closed using a TX 60 stapler. We oversewed the staple line with 2-0 silk sutures.  Once this was done the anastomosis allowed to fall back into the left upper quadrant of the abdomen. We irrigated with saline solution then the surgeon changed gowns and gloves and went ahead to close.  The fascia was closed at the colostomy site using interrupted figure-of-eight stitches of #1 Novafil. These midline fascia was closed using running looped #1 PDS suture with intervening figure-of-eight #1 Novafil's for internal retentions. We irrigated the subcutaneous at the colostomy site and the midline wound. We closed the colostomy over a quarter-inch Penrose drain in the wound using stainless steel staples. The midline wound was closed using running stainless steel staples. All needle counts, sponge counts, and instrument counts were correct.  PATIENT DISPOSITION:  PACU - hemodynamically stable.   Louis [name] 9/10/20189:24 AM

## 2017-01-12 NOTE — Anesthesia Postprocedure Evaluation (Signed)
Anesthesia Post Note  Patient: Louis [name]  Procedure(s) Performed: Procedure(s) (LRB): COLOSTOMY REVERSAL (N/A)     Patient location during evaluation: PACU Anesthesia Type: General Level of consciousness: awake and alert Pain management: pain level controlled Vital Signs Assessment: post-procedure vital signs reviewed and stable Respiratory status: spontaneous breathing, nonlabored ventilation, respiratory function stable and patient connected to nasal cannula oxygen Cardiovascular status: blood pressure returned to baseline and stable Postop Assessment: no signs of nausea or vomiting Anesthetic complications: no    Last Vitals:  Vitals:   01/12/17 1058 01/12/17 1107  BP:  (!) 144/78  Pulse:  74  Resp: 17 18  Temp:  36.4 C  SpO2: 92% 95%    Last Pain:  Vitals:   01/12/17 1107  TempSrc: Oral  PainSc:                  [name]

## 2017-01-13 ENCOUNTER — Encounter (HOSPITAL_COMMUNITY): Payer: Self-pay | Admitting: General Surgery

## 2017-01-13 LAB — BASIC METABOLIC PANEL
ANION GAP: 6 (ref 5–15)
BUN: 31 mg/dL — ABNORMAL HIGH (ref 6–20)
CHLORIDE: 110 mmol/L (ref 101–111)
CO2: 21 mmol/L — ABNORMAL LOW (ref 22–32)
Calcium: 8.6 mg/dL — ABNORMAL LOW (ref 8.9–10.3)
Creatinine, Ser: 2.32 mg/dL — ABNORMAL HIGH (ref 0.61–1.24)
GFR, EST AFRICAN AMERICAN: 31 mL/min — AB (ref >60)
GFR, EST NON AFRICAN AMERICAN: 27 mL/min — AB (ref >60)
Glucose, Bld: 142 mg/dL — ABNORMAL HIGH (ref 65–99)
Potassium: 4 mmol/L (ref 3.5–5.1)
Sodium: 137 mmol/L (ref 135–145)

## 2017-01-13 LAB — CBC
HEMATOCRIT: 39.1 % (ref 39.0–52.0)
Hemoglobin: 12.8 g/dL — ABNORMAL LOW (ref 13.0–17.0)
MCH: 29.3 pg (ref 26.0–34.0)
MCHC: 32.7 g/dL (ref 30.0–36.0)
MCV: 89.5 fL (ref 78.0–100.0)
Platelets: 131 10*3/uL — ABNORMAL LOW (ref 150–400)
RBC: 4.37 MIL/uL (ref 4.22–5.81)
RDW: 14.5 % (ref 11.5–15.5)
WBC: 8.7 10*3/uL (ref 4.0–10.5)

## 2017-01-13 MED ORDER — POTASSIUM CHLORIDE IN NACL 20-0.9 MEQ/L-% IV SOLN
INTRAVENOUS | Status: DC
  Administered 2017-01-13 (×2): via INTRAVENOUS
  Filled 2017-01-13 (×2): qty 1000

## 2017-01-13 NOTE — Progress Notes (Signed)
Pt's foley was d/c in the AM. He is on room air. Pt was advanced to a soft diet. He walked 25 ft.

## 2017-01-13 NOTE — Consult Note (Signed)
[facility] CM Primary Care Navigator  01/13/2017  Louis [name] 26-Feb-1946 [id number]   Went to see patient at the bedside to identify possible discharge needs and met with wife [name]). Patient reports that he is now in the position to have colostomy reversed which led to this admission/ surgery. [facility].Attempted to convince patient to see a primary care provider per wife's request but patient adamantly refused and states he will not be changing doctors. Wife mentioned that they have the doctor's name of a primary care provider should patient decide to see one.   [facility] [address] [facility].   Patienthas beenmanaging his medicationsat home straight out of the containers.   Patient reports driving prior to admission/ surgery but wife will be providingtransportation to hisdoctors'appointments after discharge as stated.  Patient's wife is hisprimary caregiver at home.  Anticipated discharge plan is home according to patient.  Patientand wife voiced understanding to callprimary care provider's officewhen hegets home,to schedulea post discharge follow-upwithin a week or sooner if needed. Patient letter (with PCP's contact number) was provided as their reminder.  Explained to patient and wife regarding[facility] CM services available for health management at home buthe denied any current needs or concerns at this time.  Patient declined EMMI Calls offeredfor follow-up at home and states "does not think it is needed for now."   Patient's wifestatesunderstandingto seek referral from primary care provider to [facility] care management if deemed necessaryfor services in the future.  [facility] care management information provided for future needs that may arise.   For questions, please  contact:  Dannielle Huh, BSN, RN- [facility] Primary Care Navigator  Telephone: [phone] [place]

## 2017-01-13 NOTE — Progress Notes (Signed)
GS Progress Note Subjective: Patient states that his pain is worse than before.  Has not been able to get out of bed.  That being said, the nurses report that he has not used his PCA very  Much at all.  When asked about using the PCA he states that he has not used it much because he does not have much pain when he is at rest.  Objective: Vital signs in last 24 hours: Temp:  [97.3 F (36.3 C)-98.6 F (37 C)] 98.6 F (37 C) (09/11 7373) Pulse Rate:  [66-82] 66 (09/11 0613) Resp:  [12-22] 16 (09/11 0613) BP: (134-156)/(69-89) 141/73 (09/11 0613) SpO2:  [92 %-98 %] 98 % (09/11 6681) Last BM Date: 01/11/17  Intake/Output from previous day: 09/10 0701 - 09/11 0700 In: 1300 [P.O.:100; I.V.:1200] Out: 2790 [Urine:2725; Blood:65] Intake/Output this shift: No intake/output data recorded.  Lungs: Clear to auscultation. To use IS today.  Oxygen saturations good.  Abd: Soft, no peritonitis, tender, some bowel sounds.  Extremities: No clinical signs or symptoms of DVT.  Neuro: Intact  Lab Results: CBC   Recent Labs  01/13/17 0330  WBC 8.7  HGB 12.8*  HCT 39.1  PLT 131*   BMET  Recent Labs  01/13/17 0330  NA 137  K 4.0  CL 110  CO2 21*  GLUCOSE 142*  BUN 31*  CREATININE 2.32*  CALCIUM 8.6*   PT/INR No results for input(s): LABPROT, INR in the last 72 hours. ABG No results for input(s): PHART, HCO3 in the last 72 hours.  Invalid input(s): PCO2, PO2  Studies/Results: No results found.  Anti-infectives: Anti-infectives    Start     Dose/Rate Route Frequency Ordered Stop   01/12/17 2000  cefoTEtan (CEFOTAN) 2 g in dextrose 5 % 50 mL IVPB     2 g 100 mL/hr over 30 Minutes Intravenous Every 12 hours 01/12/17 1216 01/12/17 2057   01/12/17 1100  cefoTEtan (CEFOTAN) 2 g in dextrose 5 % 50 mL IVPB  Status:  Discontinued     2 g 100 mL/hr over 30 Minutes Intravenous Every 12 hours 01/12/17 1056 01/12/17 1216   01/12/17 0715  cefoTEtan in Dextrose 5% (CEFOTAN) IVPB 2 g      2 g Intravenous On call to O.R. 01/11/17 1534 01/12/17 0740   01/12/17 0547  neomycin (MYCIFRADIN) tablet 1,000 mg  Status:  Discontinued     1,000 mg Oral 3 times per day 01/12/17 0547 01/12/17 0557   01/12/17 0547  metroNIDAZOLE (FLAGYL) tablet 1,000 mg  Status:  Discontinued     1,000 mg Oral 3 times per day 01/12/17 0547 01/12/17 0559      Assessment/Plan: s/p Procedure(s): COLOSTOMY REVERSAL d/c foley Advance diet IVFs at 100  Continue entereg  LOS: 1 day    Kathryne Eriksson. Dahlia Bailiff, MD, FACS [phone] [facility] Surgery 01/13/2017

## 2017-01-14 MED ORDER — ACETAMINOPHEN 160 MG/5ML PO SOLN
650.0000 mg | Freq: Three times a day (TID) | ORAL | Status: DC
  Administered 2017-01-14 – 2017-01-15 (×3): 650 mg via ORAL
  Filled 2017-01-14 (×3): qty 20.3

## 2017-01-14 MED ORDER — TRAMADOL HCL 50 MG PO TABS
50.0000 mg | ORAL_TABLET | Freq: Four times a day (QID) | ORAL | Status: DC | PRN
  Administered 2017-01-14: 50 mg via ORAL
  Filled 2017-01-14: qty 1

## 2017-01-14 MED ORDER — ACETAMINOPHEN 325 MG PO TABS
650.0000 mg | ORAL_TABLET | Freq: Three times a day (TID) | ORAL | Status: DC
  Administered 2017-01-14: 650 mg via ORAL
  Filled 2017-01-14 (×2): qty 2

## 2017-01-14 MED ORDER — HYDROMORPHONE HCL 1 MG/ML IJ SOLN: 0.5000 mg | INTRAMUSCULAR | Status: DC | PRN

## 2017-01-14 NOTE — Progress Notes (Signed)
GS Progress Note Subjective: Patient had a bowel movement and passed some bloodo per rectum.  The bloody output is not unexpected and volume is low.  Not a concern at this point.  Will recheck labs tomorrow.  Objective: Vital signs in last 24 hours: Temp:  [97.4 F (36.3 C)-98.6 F (37 C)] 97.6 F (36.4 C) (09/12 0528) Pulse Rate:  [62-95] 95 (09/12 0528) Resp:  [16-25] 18 (09/12 0528) BP: (143-180)/(76-90) 158/87 (09/12 0528) SpO2:  [91 %-97 %] 95 % (09/12 0528) Weight:  [106.6 kg (235 lb 0.2 oz)] 106.6 kg (235 lb 0.2 oz) (09/11 1711) Last BM Date: 01/11/17  Intake/Output from previous day: 09/11 0701 - 09/12 0700 In: 2438.3 [P.O.:240; I.V.:2198.3] Out: 750 [Urine:750] Intake/Output this shift: No intake/output data recorded.  Lungs: Clear  Abd: Soft, minimally tender.  Good bowel sounds.  Extremities: No clinical signs or symptoms of DVT  Neuro: Intact  Lab Results: CBC   Recent Labs  01/13/17 0330  WBC 8.7  HGB 12.8*  HCT 39.1  PLT 131*   BMET  Recent Labs  01/13/17 0330  NA 137  K 4.0  CL 110  CO2 21*  GLUCOSE 142*  BUN 31*  CREATININE 2.32*  CALCIUM 8.6*   PT/INR No results for input(s): LABPROT, INR in the last 72 hours. ABG No results for input(s): PHART, HCO3 in the last 72 hours.  Invalid input(s): PCO2, PO2  Studies/Results: No results found.  Anti-infectives: Anti-infectives    Start     Dose/Rate Route Frequency Ordered Stop   01/12/17 2000  cefoTEtan (CEFOTAN) 2 g in dextrose 5 % 50 mL IVPB     2 g 100 mL/hr over 30 Minutes Intravenous Every 12 hours 01/12/17 1216 01/12/17 2057   01/12/17 1100  cefoTEtan (CEFOTAN) 2 g in dextrose 5 % 50 mL IVPB  Status:  Discontinued     2 g 100 mL/hr over 30 Minutes Intravenous Every 12 hours 01/12/17 1056 01/12/17 1216   01/12/17 0715  cefoTEtan in Dextrose 5% (CEFOTAN) IVPB 2 g     2 g Intravenous On call to O.R. 01/11/17 1534 01/12/17 0740   01/12/17 0547  neomycin (MYCIFRADIN) tablet  1,000 mg  Status:  Discontinued     1,000 mg Oral 3 times per day 01/12/17 0547 01/12/17 0557   01/12/17 0547  metroNIDAZOLE (FLAGYL) tablet 1,000 mg  Status:  Discontinued     1,000 mg Oral 3 times per day 01/12/17 0547 01/12/17 0559      Assessment/Plan: s/p Procedure(s): COLOSTOMY REVERSAL Advance diet Discontinue PCA pump.  Dilaudid pushes for breakthrough pain. Tramadol for pain control  LOS: 2 days    Kathryne Eriksson. Dahlia Bailiff, MD, FACS [phone] [facility] Surgery 01/14/2017

## 2017-01-15 LAB — CBC WITH DIFFERENTIAL/PLATELET
BASOS PCT: 0 %
Basophils Absolute: 0 10*3/uL (ref 0.0–0.1)
EOS ABS: 0.5 10*3/uL (ref 0.0–0.7)
Eosinophils Relative: 6 %
HEMATOCRIT: 40.9 % (ref 39.0–52.0)
Hemoglobin: 13.4 g/dL (ref 13.0–17.0)
Lymphocytes Relative: 13 %
Lymphs Abs: 1.1 10*3/uL (ref 0.7–4.0)
MCH: 29.3 pg (ref 26.0–34.0)
MCHC: 32.8 g/dL (ref 30.0–36.0)
MCV: 89.3 fL (ref 78.0–100.0)
MONO ABS: 1 10*3/uL (ref 0.1–1.0)
MONOS PCT: 12 %
Neutro Abs: 5.7 10*3/uL (ref 1.7–7.7)
Neutrophils Relative %: 69 %
Platelets: 130 10*3/uL — ABNORMAL LOW (ref 150–400)
RBC: 4.58 MIL/uL (ref 4.22–5.81)
RDW: 14.4 % (ref 11.5–15.5)
WBC: 8.3 10*3/uL (ref 4.0–10.5)

## 2017-01-15 NOTE — Progress Notes (Signed)
GS Progress Note Subjective: Refused blood work this morning.  Will h ave it done now.  Minimal pain.  Objective: Vital signs in last 24 hours: Temp:  [97.5 F (36.4 C)-98.5 F (36.9 C)] 97.5 F (36.4 C) (09/13 0452) Pulse Rate:  [70-82] 70 (09/13 0452) Resp:  [16-18] 18 (09/13 0452) BP: (123-151)/(58-88) 123/58 (09/13 0452) SpO2:  [95 %-98 %] 96 % (09/13 0452) Last BM Date: 01/14/17  Intake/Output from previous day: 09/12 0701 - 09/13 0700 In: 1380 [P.O.:1380] Out: -  Intake/Output this shift: No intake/output data recorded.  Lungs: Clear  Abd: Soft, having frequent bowel movements.  Wound is clea and dry.  Pnrose at ostomy site to come out tomorrow.  Extremities: No clinical signs or symptoms of DVT  Neuro: Intact  Lab Results: CBC   Recent Labs  01/13/17 0330  WBC 8.7  HGB 12.8*  HCT 39.1  PLT 131*   BMET  Recent Labs  01/13/17 0330  NA 137  K 4.0  CL 110  CO2 21*  GLUCOSE 142*  BUN 31*  CREATININE 2.32*  CALCIUM 8.6*   PT/INR No results for input(s): LABPROT, INR in the last 72 hours. ABG No results for input(s): PHART, HCO3 in the last 72 hours.  Invalid input(s): PCO2, PO2  Studies/Results: No results found.  Anti-infectives: Anti-infectives    Start     Dose/Rate Route Frequency Ordered Stop   01/12/17 2000  cefoTEtan (CEFOTAN) 2 g in dextrose 5 % 50 mL IVPB     2 g 100 mL/hr over 30 Minutes Intravenous Every 12 hours 01/12/17 1216 01/12/17 2057   01/12/17 1100  cefoTEtan (CEFOTAN) 2 g in dextrose 5 % 50 mL IVPB  Status:  Discontinued     2 g 100 mL/hr over 30 Minutes Intravenous Every 12 hours 01/12/17 1056 01/12/17 1216   01/12/17 0715  cefoTEtan in Dextrose 5% (CEFOTAN) IVPB 2 g     2 g Intravenous On call to O.R. 01/11/17 1534 01/12/17 0740   01/12/17 0547  neomycin (MYCIFRADIN) tablet 1,000 mg  Status:  Discontinued     1,000 mg Oral 3 times per day 01/12/17 0547 01/12/17 0557   01/12/17 0547  metroNIDAZOLE (FLAGYL) tablet  1,000 mg  Status:  Discontinued     1,000 mg Oral 3 times per day 01/12/17 0547 01/12/17 0559      Assessment/Plan: s/p Procedure(s): [place] for discharge tomorrow  LOS: 3 days    Louis [name]. Dahlia Bailiff, MD, FACS [phone] [facility] Surgery 01/15/2017

## 2017-01-16 NOTE — Discharge Summary (Signed)
Physician Discharge Summary  Patient ID: Louis [name] MRN: [id number] DOB/AGE: [birth year] 71 y.o.  Admit date: 01/12/2017 Discharge date: 01/16/2017  Admission Diagnoses:  Discharge Diagnoses:  Active Problems:   Colostomy in place [facility])   Discharged Condition: good  Hospital Course: Admitted after colostomy reversal.  Has done well.  No fevers postoperatively.  Eating well.  Haaving regular bowel movements.  Hemoglobin is stable.  Wounds are clean and dry.  Consults: None  Significant Diagnostic Studies: labs: cbc/bmet  Treatments: IV hydration, antibiotics: Cefotetan, analgesia: acetaminophen, Dilaudid and oxycodone/tramadol and surgery: Reversal of colostomy  Discharge Exam: Blood pressure 134/71, pulse 69, temperature 98.6 F (37 C), temperature source Oral, resp. rate 18, height 6\' 5"  (1.956 m), weight 106.6 kg (235 lb 0.2 oz), SpO2 98 %. General appearance: alert, cooperative, appears stated age and no distress Resp: clear to auscultation bilaterally GI: soft, non-tender; bowel sounds normal; no masses,  no organomegaly and Penrose drain removed from ostomy wound and it looks good.  Disposition: 06-Home-Health Care Svc  Discharge Instructions    Call MD for:  difficulty breathing, headache or visual disturbances    Complete by:  As directed    Call MD for:  extreme fatigue    Complete by:  As directed    Call MD for:  hives    Complete by:  As directed    Call MD for:  persistant dizziness or light-headedness    Complete by:  As directed    Call MD for:  persistant nausea and vomiting    Complete by:  As directed    Call MD for:  redness, tenderness, or signs of infection (pain, swelling, redness, odor or green/yellow discharge around incision site)    Complete by:  As directed    Call MD for:  severe uncontrolled pain    Complete by:  As directed    Call MD for:  temperature >100.4    Complete by:  As directed    Diet general    Complete by:  As directed    Driving Restrictions    Complete by:  As directed    One week   Increase activity slowly    Complete by:  As directed    Lifting restrictions    Complete by:  As directed    Nothing greater than 20 pounds for the next 6 weeks   Remove dressing in 48 hours    Complete by:  As directed    May shower and pat the wound dry, recover with 4x4 as needed.     Allergies as of 01/16/2017   No Known Allergies     Medication List    STOP taking these medications   lidocaine-prilocaine cream Commonly known as:  EMLA   loperamide 2 MG capsule Commonly known as:  IMODIUM   prochlorperazine 10 MG tablet Commonly known as:  COMPAZINE     TAKE these medications   amLODipine 10 MG tablet Commonly known as:  NORVASC Take 10 mg by mouth daily.   chlorpheniramine 4 MG tablet Commonly known as:  CHLOR-TRIMETON Take 4 mg by mouth daily.   labetalol 100 MG tablet Commonly known as:  NORMODYNE Take 100 mg by mouth 2 (two) times daily.   naproxen sodium 220 MG tablet Commonly known as:  ANAPROX Take 220 mg by mouth daily.   omega-3 acid ethyl esters 1 g capsule Commonly known as:  LOVAZA Take 1 capsule by mouth daily as needed (for cholesterol).  Discharge Care Instructions        Start     Ordered   01/16/17 0000  Increase activity slowly     01/16/17 0756   01/16/17 0000  Diet general     01/16/17 0756   01/16/17 0000  Driving Restrictions    Comments:  One week   01/16/17 0756   01/16/17 0000  Lifting restrictions    Comments:  Nothing greater than 20 pounds for the next 6 weeks   01/16/17 0756   01/16/17 0000  Remove dressing in 48 hours    Comments:  May shower and pat the wound dry, recover with 4x4 as needed.   01/16/17 0756   01/16/17 0000  Call MD for:  extreme fatigue     01/16/17 0756   01/16/17 0000  Call MD for:  persistant dizziness or light-headedness     01/16/17 0756   01/16/17 0000  Call MD for:  hives     01/16/17 0756   01/16/17 0000   Call MD for:  difficulty breathing, headache or visual disturbances     01/16/17 0756   01/16/17 0000  Call MD for:  redness, tenderness, or signs of infection (pain, swelling, redness, odor or green/yellow discharge around incision site)     01/16/17 0756   01/16/17 0000  Call MD for:  severe uncontrolled pain     01/16/17 0756   01/16/17 0000  Call MD for:  persistant nausea and vomiting     01/16/17 0756   01/16/17 0000  Call MD for:  temperature >100.4     01/16/17 0756     Follow-up Information    Judeth Horn, MD Follow up in 2 week(s).   Specialty:  General Surgery Contact information: [address] [place] [address] [phone]        [facility] [place] Follow up.   Specialty:  General Surgery Why:  Call Carlene Coria for nurse visit only next Tuesday or Wednesday for staple removal Contact information: [address] [place] [phone]          Signed: Judeth Horn 01/16/2017, 7:57 AM

## 2017-01-16 NOTE — Discharge Instructions (Signed)
End Colostomy Reversal, Care After Refer to this sheet in the next few weeks. These instructions provide you with information about caring for yourself after your procedure. Your health care provider may also give you more specific instructions. Your treatment has been planned according to current medical practices, but problems sometimes occur. Call your health care provider if you have any problems or questions after your procedure. What can I expect after the procedure? After the procedure, it is common to have:  Pain and discomfort in your abdomen, especially near your incision.  Loose stools.  Decreased appetite.  Follow these instructions at home: Activity  Return to your normal activities as told by your health care provider. Ask your health care provider what activities are safe for you.  Avoid strenuous activity, contact sports, and abdominal exercises for 4 weeks or as long as told by your health care provider.  Do not lift anything that is heavier than 10 lb (4.5 kg). Incision care   Follow instructions from your health care provider about how to take care of your incision. Make sure you: ? Wash your hands with soap and water before you change your bandage (dressing). If soap and water are not available, use hand sanitizer. ? Change your dressing as told by your health care provider. ? Leave stitches (sutures), skin glue, or adhesive strips in place. These skin closures may need to stay in place for 2 weeks or longer. If adhesive strip edges start to loosen and curl up, you may trim the loose edges. Do not remove adhesive strips completely unless your health care provider tells you to do that.  Keep the incision area clean and dry.  Check your incision area every day for signs of infection. Check for: ? More redness, swelling, or pain. ? More fluid or blood. ? Warmth. ? Pus or a bad smell. Bathing  Do not take baths, swim, or use a hot tub until your health care  provider approves. Ask your health care provider if you can take showers. You may only be allowed to take sponge baths for bathing.  If your health care provider approves bathing and showering, cover the bandage with a watertight plastic bag to protect it from water. Do not let the bandage get wet.  Keep the bandage (dressing) dry until your health care provider says it can be removed. Eating and drinking  Follow instructions from your health care provider about eating or drinking restrictions.  Drink enough fluid to keep your urine clear or pale yellow. Driving  Do not drive for 24 hours if you received a sedative.  Do not drive or operate heavy machinery while taking prescription pain medicine. General instructions  Take over-the-counter and prescription medicines only as told by your health care provider.  Do not use any tobacco products, such as cigarettes, chewing tobacco, and e-cigarettes. If you need help quitting, ask your health care provider.  Keep all follow-up visits as told by your health care provider. This is important. Contact a health care provider if:  You havemore redness, swelling, or pain at the site of your incision.  You have more fluid or blood coming from your incision.  Your incision feels warm to the touch.  You have pus or a bad smell coming from your incision.  You have a fever.  Your incision breaks open.  You keep vomiting or feeling nauseous.  You are not able to have a bowel movement (are constipated).  You have pain that is not  controlled with medicine. Get help right away if:  You have a rash.  You have difficulty breathing. This information is not intended to replace advice given to you by your health care provider. Make sure you discuss any questions you have with your health care provider.  Kathryne Eriksson. Dahlia Bailiff, MD, [place] [phone] [facility] Surgery

## 2017-01-27 ENCOUNTER — Other Ambulatory Visit (HOSPITAL_BASED_OUTPATIENT_CLINIC_OR_DEPARTMENT_OTHER): Payer: Medicare Other

## 2017-01-27 ENCOUNTER — Ambulatory Visit (HOSPITAL_BASED_OUTPATIENT_CLINIC_OR_DEPARTMENT_OTHER): Payer: Medicare Other | Admitting: Oncology

## 2017-01-27 VITALS — BP 155/84 | HR 71 | Temp 98.2°F | Resp 20 | Ht 77.0 in | Wt 229.5 lb

## 2017-01-27 DIAGNOSIS — I10 Essential (primary) hypertension: Secondary | ICD-10-CM | POA: Diagnosis not present

## 2017-01-27 DIAGNOSIS — C189 Malignant neoplasm of colon, unspecified: Secondary | ICD-10-CM

## 2017-01-27 DIAGNOSIS — N189 Chronic kidney disease, unspecified: Secondary | ICD-10-CM | POA: Diagnosis not present

## 2017-01-27 DIAGNOSIS — Z85038 Personal history of other malignant neoplasm of large intestine: Secondary | ICD-10-CM | POA: Diagnosis not present

## 2017-01-27 DIAGNOSIS — G620 Drug-induced polyneuropathy: Secondary | ICD-10-CM | POA: Diagnosis not present

## 2017-01-27 LAB — CBC WITH DIFFERENTIAL/PLATELET
BASO%: 0.6 % (ref 0.0–2.0)
Basophils Absolute: 0 10*3/uL (ref 0.0–0.1)
EOS ABS: 0.4 10*3/uL (ref 0.0–0.5)
EOS%: 6.5 % (ref 0.0–7.0)
HCT: 40 % (ref 38.4–49.9)
HEMOGLOBIN: 13.3 g/dL (ref 13.0–17.1)
LYMPH%: 24.4 % (ref 14.0–49.0)
MCH: 29.9 pg (ref 27.2–33.4)
MCHC: 33.3 g/dL (ref 32.0–36.0)
MCV: 89.9 fL (ref 79.3–98.0)
MONO#: 0.7 10*3/uL (ref 0.1–0.9)
MONO%: 10.7 % (ref 0.0–14.0)
NEUT%: 57.8 % (ref 39.0–75.0)
NEUTROS ABS: 3.7 10*3/uL (ref 1.5–6.5)
Platelets: 200 10*3/uL (ref 140–400)
RBC: 4.45 10*6/uL (ref 4.20–5.82)
RDW: 14 % (ref 11.0–14.6)
WBC: 6.4 10*3/uL (ref 4.0–10.3)
lymph#: 1.6 10*3/uL (ref 0.9–3.3)

## 2017-01-27 NOTE — Progress Notes (Signed)
  [place] OFFICE PROGRESS NOTE   Diagnosis: Colon cancer  INTERVAL HISTORY:   Louis [name] returns as scheduled. He underwent colostomy reversal on 01/12/2017. Dr. Hulen Skains noted no evidence of recurrent cancer. The colostomy wound has opened. He is packing the wound and is scheduled to see Dr. Hulen Skains next week.  He reports minimal remaining neuropathy symptoms with a "slick "feeling in the fingers.  Objective:  Vital signs in last 24 hours:  Blood pressure (!) 155/84, pulse 71, temperature 98.2 F (36.8 C), temperature source Oral, resp. rate 20, height '6\' 5"'$  (1.956 m), weight 229 lb 8 oz (104.1 kg), SpO2 100 %.    HEENT: Neck without mass Lymphatics: No cervical, supra-clavicular, axillary, or inguinal nodes Resp: Lungs clear bilaterally Cardio: Regular rate and rhythm GI: No hepatosplenomegaly, no mass, gauze dressing covering the abdominal wound Vascular: No leg edema   Lab Results:  Lab Results  Component Value Date   WBC 6.4 01/27/2017   HGB 13.3 01/27/2017   HCT 40.0 01/27/2017   MCV 89.9 01/27/2017   PLT 200 01/27/2017   NEUTROABS 3.7 01/27/2017    Lab Results  Component Value Date   CEA1 <1.00 10/30/2016     Medications: I have reviewed the patient's current medications.  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 Sigmoid/descending resection and creation of a descending colostomy 04/10/2016 ? MSI-stable, no loss of mismatch repair protein expression ? Foundation 1-BRAF V600Epositive, MS-stable, intermediate tumor mutation burden, no RAS mutation ? CT abdomen/pelvis 04/02/2016-no evidence of distant metastatic disease ? Cycle 1 FOLFOX 05/21/2016 ? Cycle 2 FOLFOX 06/04/2016 ? Cycle 3 FOLFOX 06/18/2016 ? Cycle 4 FOLFOX 07/02/2016 (oxaliplatin held secondary to thrombocytopenia) ? Cycle 5 FOLFOX 07/16/2016 ? Cycle 6 FOLFOX 07/30/2016 ? Cycle 7 FOLFOX  08/13/2016 (oxaliplatin held and 5-FU dose reduced) ? Cycle 8 FOLFOX 09/03/2016 (oxaliplatin held secondary to neuropathy) ? Cycle 9 FOLFOX 09/17/2016 (oxaliplatin held secondary to neuropathy) ? Cycle 10 FOLFOX 10/02/2016 ? Cycle 11 FOLFOX 10/15/2016 (oxaliplatin eliminated from the regimen) ? Cycle 12 FOLFOX 10/30/2016 (oxaliplatin eliminated)  2. Chronic renal insufficiency  3. Hypertension  4. Inflamed sebaceous cyst at the upper back-status post incision and drainage  5. Thrombocytopenia secondary chemotherapy-oxaliplatin dose reduced beginning with cycle 3 FOLFOX, oxaliplatin held with cycle 4 and cycle 7 FOLFOX  6. Oxaliplatin neuropathy-improved  7.  History ofMucositis secondary chemotherapy   Disposition:  He remains included remission from colon cancer. We will follow-up on the CEA from today. Louis [name] will be scheduled for an office visit, CEA, and restaging CTs in 6 months.  I recommended he obtain a yearly influenza vaccine.  15 minutes were spent with the patient today. The majority of the time was used for counseling and coordination of care.  Donneta Romberg, MD  01/27/2017  4:07 PM

## 2017-01-28 ENCOUNTER — Telehealth: Payer: Self-pay

## 2017-01-28 LAB — CEA (IN HOUSE-CHCC): CEA (CHCC-In House): 2.02 ng/mL (ref 0.00–5.00)

## 2017-01-28 NOTE — Telephone Encounter (Signed)
Call placed to inform pt that his CEA is normal per MD Benay Spice and to follow-up with [facility] as scheduled. Pt's wife verbalized understanding .

## 2017-01-28 NOTE — Telephone Encounter (Signed)
-----   Message from Ladell Pier, MD sent at 01/28/2017 12:30 PM EDT ----- Please call patient, cea is normal, f/u as scheduled

## 2017-01-30 ENCOUNTER — Other Ambulatory Visit: Payer: Medicare Other

## 2017-02-26 DIAGNOSIS — N183 Chronic kidney disease, stage 3 (moderate): Secondary | ICD-10-CM | POA: Diagnosis not present

## 2017-02-26 DIAGNOSIS — E785 Hyperlipidemia, unspecified: Secondary | ICD-10-CM | POA: Diagnosis not present

## 2017-02-26 DIAGNOSIS — N179 Acute kidney failure, unspecified: Secondary | ICD-10-CM | POA: Diagnosis not present

## 2017-02-26 DIAGNOSIS — N2581 Secondary hyperparathyroidism of renal origin: Secondary | ICD-10-CM | POA: Diagnosis not present

## 2017-02-26 DIAGNOSIS — Z23 Encounter for immunization: Secondary | ICD-10-CM | POA: Diagnosis not present

## 2017-02-26 DIAGNOSIS — E669 Obesity, unspecified: Secondary | ICD-10-CM | POA: Diagnosis not present

## 2017-02-26 DIAGNOSIS — I10 Essential (primary) hypertension: Secondary | ICD-10-CM | POA: Diagnosis not present

## 2017-02-26 DIAGNOSIS — C187 Malignant neoplasm of sigmoid colon: Secondary | ICD-10-CM | POA: Diagnosis not present

## 2017-03-28 DIAGNOSIS — M1712 Unilateral primary osteoarthritis, left knee: Secondary | ICD-10-CM | POA: Diagnosis not present

## 2017-04-24 DIAGNOSIS — M1712 Unilateral primary osteoarthritis, left knee: Secondary | ICD-10-CM | POA: Diagnosis not present

## 2017-05-01 ENCOUNTER — Encounter: Payer: Self-pay | Admitting: Gastroenterology

## 2017-05-04 DIAGNOSIS — R2689 Other abnormalities of gait and mobility: Secondary | ICD-10-CM | POA: Diagnosis not present

## 2017-05-04 DIAGNOSIS — M25562 Pain in left knee: Secondary | ICD-10-CM | POA: Diagnosis not present

## 2017-05-05 HISTORY — PX: COLONOSCOPY: SHX174

## 2017-05-07 DIAGNOSIS — M25562 Pain in left knee: Secondary | ICD-10-CM | POA: Diagnosis not present

## 2017-05-07 DIAGNOSIS — R2689 Other abnormalities of gait and mobility: Secondary | ICD-10-CM | POA: Diagnosis not present

## 2017-05-11 DIAGNOSIS — M25562 Pain in left knee: Secondary | ICD-10-CM | POA: Diagnosis not present

## 2017-05-11 DIAGNOSIS — R2689 Other abnormalities of gait and mobility: Secondary | ICD-10-CM | POA: Diagnosis not present

## 2017-05-12 ENCOUNTER — Other Ambulatory Visit: Payer: Medicare Other

## 2017-05-12 ENCOUNTER — Ambulatory Visit (HOSPITAL_COMMUNITY)
Admission: RE | Admit: 2017-05-12 | Discharge: 2017-05-12 | Disposition: A | Discharge status: Home / Self Care | Payer: Medicare Other | Source: Ambulatory Visit | Attending: Oncology | Admitting: Oncology

## 2017-05-12 ENCOUNTER — Encounter (HOSPITAL_COMMUNITY): Payer: Self-pay

## 2017-05-12 DIAGNOSIS — K4090 Unilateral inguinal hernia, without obstruction or gangrene, not specified as recurrent: Secondary | ICD-10-CM | POA: Diagnosis not present

## 2017-05-12 DIAGNOSIS — N281 Cyst of kidney, acquired: Secondary | ICD-10-CM | POA: Diagnosis not present

## 2017-05-12 DIAGNOSIS — C189 Malignant neoplasm of colon, unspecified: Secondary | ICD-10-CM | POA: Insufficient documentation

## 2017-05-14 ENCOUNTER — Encounter: Payer: Self-pay | Admitting: Oncology

## 2017-05-14 ENCOUNTER — Inpatient Hospital Stay: Payer: Medicare Other | Attending: Oncology | Admitting: Oncology

## 2017-05-14 VITALS — BP 156/81 | HR 67 | Temp 97.6°F | Resp 18 | Ht 77.0 in | Wt 237.5 lb

## 2017-05-14 DIAGNOSIS — D6959 Other secondary thrombocytopenia: Secondary | ICD-10-CM | POA: Insufficient documentation

## 2017-05-14 DIAGNOSIS — C189 Malignant neoplasm of colon, unspecified: Secondary | ICD-10-CM

## 2017-05-14 DIAGNOSIS — N189 Chronic kidney disease, unspecified: Secondary | ICD-10-CM | POA: Diagnosis not present

## 2017-05-14 DIAGNOSIS — I129 Hypertensive chronic kidney disease with stage 1 through stage 4 chronic kidney disease, or unspecified chronic kidney disease: Secondary | ICD-10-CM

## 2017-05-14 DIAGNOSIS — C187 Malignant neoplasm of sigmoid colon: Secondary | ICD-10-CM | POA: Insufficient documentation

## 2017-05-14 DIAGNOSIS — G620 Drug-induced polyneuropathy: Secondary | ICD-10-CM | POA: Insufficient documentation

## 2017-05-14 DIAGNOSIS — M25562 Pain in left knee: Secondary | ICD-10-CM | POA: Diagnosis not present

## 2017-05-14 DIAGNOSIS — R2689 Other abnormalities of gait and mobility: Secondary | ICD-10-CM | POA: Diagnosis not present

## 2017-05-14 NOTE — Progress Notes (Signed)
[facility] OFFICE PROGRESS NOTE   Diagnosis: Colon cancer  INTERVAL HISTORY:   Louis [name] returns as scheduled.  He feels well.  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  He recently received a steroid injection at the left knee and this helped.  He has not undergone a surveillance colonoscopy  Objective:  Vital signs in last 24 hours:  Blood pressure (!) 156/81, pulse 67, temperature 97.6 F (36.4 C), temperature source Oral, resp. rate 18, height '6\' 5"'  (1.956 m), weight 237 lb 8 oz (107.7 kg), SpO2 99 %.    HEENT: Neck without mass Lymphatics: No cervical, supraclavicular, axillary, or inguinal nodes.  Prominent right axillary fat pad. Resp: Lungs clear bilaterally Cardio: Regular rate and rhythm GI: No hepatosplenomegaly, no mass, nontender, soft reducible right inguinal/scrotal hernia Vascular: No leg edema   Lab Results:  Lab Results  Component Value Date   WBC 6.4 01/27/2017   HGB 13.3 01/27/2017   HCT 40.0 01/27/2017   MCV 89.9 01/27/2017   PLT 200 01/27/2017   NEUTROABS 3.7 01/27/2017    CMP     Component Value Date/Time   NA 137 01/13/2017 0330   NA 143 10/30/2016 1038   K 4.0 01/13/2017 0330   K 4.0 10/30/2016 1038   CL 110 01/13/2017 0330   CO2 21 (L) 01/13/2017 0330   CO2 27 10/30/2016 1038   GLUCOSE 142 (H) 01/13/2017 0330   GLUCOSE 107 10/30/2016 1038   BUN 31 (H) 01/13/2017 0330   BUN 35.3 (H) 10/30/2016 1038   CREATININE 2.32 (H) 01/13/2017 0330   CREATININE 2.3 (H) 10/30/2016 1038   CALCIUM 8.6 (L) 01/13/2017 0330   CALCIUM 9.6 10/30/2016 1038   PROT 7.0 01/08/2017 1030   PROT 6.9 10/30/2016 1038   ALBUMIN 3.9 01/08/2017 1030   ALBUMIN 3.8 10/30/2016 1038   AST 20 01/08/2017 1030   AST 17 10/30/2016 1038   ALT 20 01/08/2017 1030   ALT 18 10/30/2016 1038   ALKPHOS 65 01/08/2017 1030   ALKPHOS 101 10/30/2016 1038   BILITOT 0.6 01/08/2017 1030   BILITOT 0.48 10/30/2016 1038   GFRNONAA 27 (L) 01/13/2017 0330    GFRAA 31 (L) 01/13/2017 0330    Lab Results  Component Value Date   CEA1 2.02 01/27/2017     Imaging:  Ct Abdomen Pelvis Wo Contrast  Result Date: 05/12/2017 CLINICAL DATA:  Colon cancer. EXAM: CT CHEST, ABDOMEN AND PELVIS WITHOUT CONTRAST TECHNIQUE: Multidetector CT imaging of the chest, abdomen and pelvis was performed following the standard protocol without IV contrast. COMPARISON:  Chest CT 05/19/2016.  Abdomen and pelvis CT 04/02/2016 FINDINGS: CT CHEST FINDINGS Cardiovascular: The heart size is normal. No pericardial effusion. Coronary artery calcification is evident. Atherosclerotic calcification is noted in the wall of the thoracic aorta. Mediastinum/Nodes: No mediastinal lymphadenopathy. No evidence for gross hilar lymphadenopathy although assessment is limited by the lack of intravenous contrast on today's study. The esophagus has normal imaging features. There is no axillary lymphadenopathy. Lungs/Pleura: 3 mm posterior left upper lobe pulmonary nodule is stable in the interval. Calcified granuloma in the lingula again noted. No suspicious pulmonary nodule or mass. No focal airspace consolidation. No pulmonary edema or pleural effusion. Musculoskeletal: Bone windows reveal no worrisome lytic or sclerotic osseous lesions. Bilateral gynecomastia is similar to prior. Right Port-A-Cath has been removed in the interval. CT ABDOMEN PELVIS FINDINGS Hepatobiliary: No focal abnormality in the liver on this study without intravenous contrast. There is no evidence  for gallstones, gallbladder wall thickening, or pericholecystic fluid. No intrahepatic or extrahepatic biliary dilation. Pancreas: No focal mass lesion. No dilatation of the main duct. No intraparenchymal cyst. No peripancreatic edema. Spleen: No splenomegaly. No focal mass lesion. Adrenals/Urinary Tract: No adrenal nodule or mass. Similar appearance bilateral renal cysts. No evidence for hydroureter. Urinary bladder remains distorted as it  tracks out a right groin hernia. Stomach/Bowel: Stomach is nondistended. No gastric wall thickening. No evidence of outlet obstruction. Duodenum is normally positioned as is the ligament of Treitz. No small bowel wall thickening. No small bowel dilatation. The terminal ileum is normal. The appendix is normal. Cecum is nondistended likely accounting for the apparent mild wall thickening. Proximal descending colon anastomosis evident. Vascular/Lymphatic: There is abdominal aortic atherosclerosis without aneurysm. There is no gastrohepatic or hepatoduodenal ligament lymphadenopathy. No intraperitoneal or retroperitoneal lymphadenopathy. No pelvic sidewall lymphadenopathy. Reproductive: Prostate gland mildly enlarged. Other: No intraperitoneal free fluid. Musculoskeletal: Bone windows reveal no worrisome lytic or sclerotic osseous lesions. IMPRESSION: 1. No evidence for metastatic disease in the chest, abdomen, or pelvis. 2. Bilateral renal cysts, similar to prior. 3. Right groin hernia contains a relatively prominent amount of urinary bladder and the bladder wall thickening noted on the prior study persists. 4.  Aortic Atherosclerois (ICD10-170.0) Electronically Signed   By: Misty Stanley M.D.   On: 05/12/2017 09:43   Ct Chest Wo Contrast  Result Date: 05/12/2017 CLINICAL DATA:  Colon cancer. EXAM: CT CHEST, ABDOMEN AND PELVIS WITHOUT CONTRAST TECHNIQUE: Multidetector CT imaging of the chest, abdomen and pelvis was performed following the standard protocol without IV contrast. COMPARISON:  Chest CT 05/19/2016.  Abdomen and pelvis CT 04/02/2016 FINDINGS: CT CHEST FINDINGS Cardiovascular: The heart size is normal. No pericardial effusion. Coronary artery calcification is evident. Atherosclerotic calcification is noted in the wall of the thoracic aorta. Mediastinum/Nodes: No mediastinal lymphadenopathy. No evidence for gross hilar lymphadenopathy although assessment is limited by the lack of intravenous contrast on  today's study. The esophagus has normal imaging features. There is no axillary lymphadenopathy. Lungs/Pleura: 3 mm posterior left upper lobe pulmonary nodule is stable in the interval. Calcified granuloma in the lingula again noted. No suspicious pulmonary nodule or mass. No focal airspace consolidation. No pulmonary edema or pleural effusion. Musculoskeletal: Bone windows reveal no worrisome lytic or sclerotic osseous lesions. Bilateral gynecomastia is similar to prior. Right Port-A-Cath has been removed in the interval. CT ABDOMEN PELVIS FINDINGS Hepatobiliary: No focal abnormality in the liver on this study without intravenous contrast. There is no evidence for gallstones, gallbladder wall thickening, or pericholecystic fluid. No intrahepatic or extrahepatic biliary dilation. Pancreas: No focal mass lesion. No dilatation of the main duct. No intraparenchymal cyst. No peripancreatic edema. Spleen: No splenomegaly. No focal mass lesion. Adrenals/Urinary Tract: No adrenal nodule or mass. Similar appearance bilateral renal cysts. No evidence for hydroureter. Urinary bladder remains distorted as it tracks out a right groin hernia. Stomach/Bowel: Stomach is nondistended. No gastric wall thickening. No evidence of outlet obstruction. Duodenum is normally positioned as is the ligament of Treitz. No small bowel wall thickening. No small bowel dilatation. The terminal ileum is normal. The appendix is normal. Cecum is nondistended likely accounting for the apparent mild wall thickening. Proximal descending colon anastomosis evident. Vascular/Lymphatic: There is abdominal aortic atherosclerosis without aneurysm. There is no gastrohepatic or hepatoduodenal ligament lymphadenopathy. No intraperitoneal or retroperitoneal lymphadenopathy. No pelvic sidewall lymphadenopathy. Reproductive: Prostate gland mildly enlarged. Other: No intraperitoneal free fluid. Musculoskeletal: Bone windows reveal no worrisome lytic or sclerotic  osseous lesions. IMPRESSION: 1. No evidence for metastatic disease in the chest, abdomen, or pelvis. 2. Bilateral renal cysts, similar to prior. 3. Right groin hernia contains a relatively prominent amount of urinary bladder and the bladder wall thickening noted on the prior study persists. 4.  Aortic Atherosclerois (ICD10-170.0) Electronically Signed   By: Misty Stanley M.D.   On: 05/12/2017 09:43    Medications: I have reviewed the patient's current medications.   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 Sigmoid/descending resection and creation of a descending colostomy 04/10/2016 ? MSI-stable, no loss of mismatch repair protein expression ? Foundation 1-BRAF V600Epositive, MS-stable, intermediate tumor mutation burden, no RAS mutation ? CT abdomen/pelvis 04/02/2016-no evidence of distant metastatic disease ? Cycle 1 FOLFOX 05/21/2016 ? Cycle 2 FOLFOX 06/04/2016 ? Cycle 3 FOLFOX 06/18/2016 ? Cycle 4 FOLFOX 07/02/2016 (oxaliplatin held secondary to thrombocytopenia) ? Cycle 5 FOLFOX 07/16/2016 ? Cycle 6 FOLFOX 07/30/2016 ? Cycle 7 FOLFOX 08/13/2016 (oxaliplatin held and 5-FU dose reduced) ? Cycle 8 FOLFOX 09/03/2016 (oxaliplatin held secondary to neuropathy) ? Cycle 9 FOLFOX 09/17/2016 (oxaliplatin held secondary to neuropathy) ? Cycle 10 FOLFOX 10/02/2016 ? Cycle 11 FOLFOX 10/15/2016 (oxaliplatin eliminated from the regimen) ? Cycle 12 FOLFOX 10/30/2016 (oxaliplatin eliminated) ? CTs 05/12/2017-no evidence of recurrent disease, right inguinal hernia containing bladder  2. Chronic renal insufficiency  3. Hypertension  4. Inflamed sebaceous cyst at the upper back-status post incision and drainage  5. Thrombocytopenia secondary chemotherapy-oxaliplatin dose reduced beginning with cycle 3 FOLFOX, oxaliplatin held with cycle 4 and cycle 7 FOLFOX  6. Oxaliplatin  neuropathy-improved  7. History ofMucositis secondary chemotherapy   Disposition:  Louis [name] remains in clinical remission from colon cancer.  We will follow-up on the CEA from today.  He will return for an office visit and CEA in 6 months.  I will refer him to Dr. Fuller Plan for a surveillance colonoscopy.  He continues follow-up with Dr. Marval Regal for management of chronic renal failure.  I recommended he take Tylenol instead of naproxen for the knee pain.  15 minutes were spent with the patient today.  The majority of the time was used for counseling and coordination of care.  Betsy Coder, MD  05/14/2017  12:32 PM

## 2017-05-15 ENCOUNTER — Telehealth: Payer: Self-pay | Admitting: Oncology

## 2017-05-15 NOTE — Telephone Encounter (Signed)
Scheduled appt per 1/10 los - Sent reminder letter in the mail with appt date and time .  

## 2017-05-19 DIAGNOSIS — M25562 Pain in left knee: Secondary | ICD-10-CM | POA: Diagnosis not present

## 2017-05-19 DIAGNOSIS — R2689 Other abnormalities of gait and mobility: Secondary | ICD-10-CM | POA: Diagnosis not present

## 2017-05-21 DIAGNOSIS — M25562 Pain in left knee: Secondary | ICD-10-CM | POA: Diagnosis not present

## 2017-05-21 DIAGNOSIS — R2689 Other abnormalities of gait and mobility: Secondary | ICD-10-CM | POA: Diagnosis not present

## 2017-05-26 ENCOUNTER — Telehealth: Payer: Self-pay | Admitting: Oncology

## 2017-05-26 DIAGNOSIS — M25562 Pain in left knee: Secondary | ICD-10-CM | POA: Diagnosis not present

## 2017-05-26 DIAGNOSIS — R2689 Other abnormalities of gait and mobility: Secondary | ICD-10-CM | POA: Diagnosis not present

## 2017-05-26 NOTE — Telephone Encounter (Signed)
Call patient back from voicemail

## 2017-05-28 ENCOUNTER — Telehealth: Payer: Self-pay | Admitting: Oncology

## 2017-05-28 DIAGNOSIS — E669 Obesity, unspecified: Secondary | ICD-10-CM | POA: Diagnosis not present

## 2017-05-28 DIAGNOSIS — N2581 Secondary hyperparathyroidism of renal origin: Secondary | ICD-10-CM | POA: Diagnosis not present

## 2017-05-28 DIAGNOSIS — E785 Hyperlipidemia, unspecified: Secondary | ICD-10-CM | POA: Diagnosis not present

## 2017-05-28 DIAGNOSIS — C187 Malignant neoplasm of sigmoid colon: Secondary | ICD-10-CM | POA: Diagnosis not present

## 2017-05-28 DIAGNOSIS — N179 Acute kidney failure, unspecified: Secondary | ICD-10-CM | POA: Diagnosis not present

## 2017-05-28 DIAGNOSIS — M25562 Pain in left knee: Secondary | ICD-10-CM | POA: Diagnosis not present

## 2017-05-28 DIAGNOSIS — I129 Hypertensive chronic kidney disease with stage 1 through stage 4 chronic kidney disease, or unspecified chronic kidney disease: Secondary | ICD-10-CM | POA: Diagnosis not present

## 2017-05-28 DIAGNOSIS — N183 Chronic kidney disease, stage 3 (moderate): Secondary | ICD-10-CM | POA: Diagnosis not present

## 2017-05-28 DIAGNOSIS — R2689 Other abnormalities of gait and mobility: Secondary | ICD-10-CM | POA: Diagnosis not present

## 2017-05-28 NOTE — Telephone Encounter (Signed)
Left message for patient regarding voicemail. Asked patient to return call at earliest convenience.

## 2017-05-29 ENCOUNTER — Encounter: Payer: Self-pay | Admitting: Gastroenterology

## 2017-06-01 DIAGNOSIS — M5136 Other intervertebral disc degeneration, lumbar region: Secondary | ICD-10-CM | POA: Diagnosis not present

## 2017-06-01 DIAGNOSIS — M25562 Pain in left knee: Secondary | ICD-10-CM | POA: Diagnosis not present

## 2017-06-01 DIAGNOSIS — M25551 Pain in right hip: Secondary | ICD-10-CM | POA: Diagnosis not present

## 2017-06-01 DIAGNOSIS — M48061 Spinal stenosis, lumbar region without neurogenic claudication: Secondary | ICD-10-CM | POA: Diagnosis not present

## 2017-06-08 DIAGNOSIS — M25562 Pain in left knee: Secondary | ICD-10-CM | POA: Diagnosis not present

## 2017-06-09 DIAGNOSIS — N183 Chronic kidney disease, stage 3 (moderate): Secondary | ICD-10-CM | POA: Diagnosis not present

## 2017-06-09 DIAGNOSIS — N390 Urinary tract infection, site not specified: Secondary | ICD-10-CM | POA: Diagnosis not present

## 2017-06-09 DIAGNOSIS — M25562 Pain in left knee: Secondary | ICD-10-CM | POA: Diagnosis not present

## 2017-06-09 DIAGNOSIS — E785 Hyperlipidemia, unspecified: Secondary | ICD-10-CM | POA: Diagnosis not present

## 2017-06-12 DIAGNOSIS — M5136 Other intervertebral disc degeneration, lumbar region: Secondary | ICD-10-CM | POA: Diagnosis not present

## 2017-06-12 DIAGNOSIS — R6889 Other general symptoms and signs: Secondary | ICD-10-CM | POA: Diagnosis not present

## 2017-06-12 DIAGNOSIS — M545 Low back pain: Secondary | ICD-10-CM | POA: Diagnosis not present

## 2017-06-12 DIAGNOSIS — M48061 Spinal stenosis, lumbar region without neurogenic claudication: Secondary | ICD-10-CM | POA: Diagnosis not present

## 2017-06-17 DIAGNOSIS — M1712 Unilateral primary osteoarthritis, left knee: Secondary | ICD-10-CM | POA: Diagnosis not present

## 2017-06-17 DIAGNOSIS — M2550 Pain in unspecified joint: Secondary | ICD-10-CM | POA: Diagnosis not present

## 2017-06-17 DIAGNOSIS — M25562 Pain in left knee: Secondary | ICD-10-CM | POA: Diagnosis not present

## 2017-06-17 DIAGNOSIS — S83242A Other tear of medial meniscus, current injury, left knee, initial encounter: Secondary | ICD-10-CM | POA: Diagnosis not present

## 2017-06-30 ENCOUNTER — Other Ambulatory Visit: Payer: Self-pay

## 2017-06-30 ENCOUNTER — Ambulatory Visit (AMBULATORY_SURGERY_CENTER): Payer: Self-pay

## 2017-06-30 VITALS — Ht 76.0 in | Wt 240.4 lb

## 2017-06-30 DIAGNOSIS — Z85038 Personal history of other malignant neoplasm of large intestine: Secondary | ICD-10-CM

## 2017-06-30 MED ORDER — NA SULFATE-K SULFATE-MG SULF 17.5-3.13-1.6 GM/177ML PO SOLN: 1.0000 | Freq: Once | ORAL | 0 refills | Status: AC

## 2017-06-30 NOTE — Progress Notes (Signed)
No egg or soy allergy known to patient  No issues with past sedation with any surgeries  or procedures, no intubation problems  No diet pills per patient No home 02 use per patient  No blood thinners per patient  Pt denies issues with constipation  No A fib or A flutter  EMMI video sent to pt's e mail pt declined   

## 2017-07-20 ENCOUNTER — Telehealth: Payer: Self-pay | Admitting: Gastroenterology

## 2017-07-20 NOTE — Telephone Encounter (Signed)
Patient states his prep is $90 and can not afford that. Suprep sample offered, he states he will come today to get this. Suprep ( 1 kit, lot # [id number]. 2/21) at front desk 4 th floor. Pt understands.

## 2017-07-21 ENCOUNTER — Encounter: Payer: Self-pay | Admitting: Gastroenterology

## 2017-07-21 ENCOUNTER — Other Ambulatory Visit: Payer: Self-pay

## 2017-07-21 ENCOUNTER — Ambulatory Visit (AMBULATORY_SURGERY_CENTER): Payer: Medicare Other | Admitting: Gastroenterology

## 2017-07-21 VITALS — BP 137/76 | HR 58 | Temp 98.4°F | Resp 13 | Ht 76.0 in | Wt 240.0 lb

## 2017-07-21 DIAGNOSIS — Z85038 Personal history of other malignant neoplasm of large intestine: Secondary | ICD-10-CM

## 2017-07-21 DIAGNOSIS — Z1211 Encounter for screening for malignant neoplasm of colon: Secondary | ICD-10-CM | POA: Diagnosis not present

## 2017-07-21 DIAGNOSIS — D124 Benign neoplasm of descending colon: Secondary | ICD-10-CM | POA: Diagnosis not present

## 2017-07-21 DIAGNOSIS — D123 Benign neoplasm of transverse colon: Secondary | ICD-10-CM | POA: Diagnosis not present

## 2017-07-21 MED ORDER — SODIUM CHLORIDE 0.9 % IV SOLN: 500.0000 mL | Freq: Once | INTRAVENOUS | Status: DC

## 2017-07-21 NOTE — Progress Notes (Signed)
To PACU, VSS. Report to [email or web address] 

## 2017-07-21 NOTE — Progress Notes (Signed)
Pt's states no medical or surgical changes since previsit or office visit. 

## 2017-07-21 NOTE — Progress Notes (Signed)
Called to room to assist during endoscopic procedure.  Patient ID and intended procedure confirmed with present staff. Received instructions for my participation in the procedure from the performing physician.  

## 2017-07-21 NOTE — Progress Notes (Signed)
No problems noted in the recovery room. maw 

## 2017-07-21 NOTE — Patient Instructions (Signed)
YOU HAD AN ENDOSCOPIC PROCEDURE TODAY AT THE [facility]:   Refer to the procedure report that was given to you for any specific questions about what was found during the examination.  If the procedure report does not answer your questions, please call your gastroenterologist to clarify.  If you requested that your care partner not be given the details of your procedure findings, then the procedure report has been included in a sealed envelope for you to review at your convenience later.  YOU SHOULD EXPECT: Some feelings of bloating in the abdomen. Passage of more gas than usual.  Walking can help get rid of the air that was put into your GI tract during the procedure and reduce the bloating. If you had a lower endoscopy (such as a colonoscopy or flexible sigmoidoscopy) you may notice spotting of blood in your stool or on the toilet paper. If you underwent a bowel prep for your procedure, you may not have a normal bowel movement for a few days.  Please Note:  You might notice some irritation and congestion in your nose or some drainage.  This is from the oxygen used during your procedure.  There is no need for concern and it should clear up in a day or so.  SYMPTOMS TO REPORT IMMEDIATELY:   Following lower endoscopy (colonoscopy or flexible sigmoidoscopy):  Excessive amounts of blood in the stool  Significant tenderness or worsening of abdominal pains  Swelling of the abdomen that is new, acute  Fever of 100F or higher   For urgent or emergent issues, a gastroenterologist can be reached at any hour by calling [phone].   DIET:  We do recommend a small meal at first, but then you may proceed to your regular diet.  Drink plenty of fluids but you should avoid alcoholic beverages for 24 hours.  ACTIVITY:  You should plan to take it easy for the rest of today and you should NOT DRIVE or use heavy machinery until tomorrow (because of the sedation medicines used during the test).     FOLLOW UP: Our staff will call the number listed on your records the next business day following your procedure to check on you and address any questions or concerns that you may have regarding the information given to you following your procedure. If we do not reach you, we will leave a message.  However, if you are feeling well and you are not experiencing any problems, there is no need to return our call.  We will assume that you have returned to your regular daily activities without incident.  If any biopsies were taken you will be contacted by phone or by letter within the next 1-3 weeks.  Please call us at [phone] if you have not heard about the biopsies in 3 weeks.    SIGNATURES/CONFIDENTIALITY: You and/or your care partner have signed paperwork which will be entered into your electronic medical record.  These signatures attest to the fact that that the information above on your After Visit Summary has been reviewed and is understood.  Full responsibility of the confidentiality of this discharge information lies with you and/or your care-partner.    Handouts were given to your care partner on polyps and hemorrhoids. You may resume your current medications today. Await biopsy results. Please call if any questions or concerns.   

## 2017-07-21 NOTE — Op Note (Signed)
[facility] Patient Name: Louis [name] Procedure Date: 07/21/2017 1:32 PM MRN: [id number] Endoscopist: Ladene Artist , MD Age: 72 Referring MD:  Date of Birth: Sep 16, 1945 Gender: Male Account #: [id number] Procedure:                Colonoscopy Indications:              High risk colon cancer surveillance: Personal                            history of colon cancer Medicines:                Monitored Anesthesia Care Procedure:                Pre-Anesthesia Assessment:                           - Prior to the procedure, a History and Physical                            was performed, and patient medications and                            allergies were reviewed. The patient's tolerance of                            previous anesthesia was also reviewed. The risks                            and benefits of the procedure and the sedation                            options and risks were discussed with the patient.                            All questions were answered, and informed consent                            was obtained. Prior Anticoagulants: The patient has                            taken no previous anticoagulant or antiplatelet                            agents. ASA Grade Assessment: II - A patient with                            mild systemic disease. After reviewing the risks                            and benefits, the patient was deemed in                            satisfactory condition to undergo the procedure.  After obtaining informed consent, the colonoscope                            was passed under direct vision. Throughout the                            procedure, the patient's blood pressure, pulse, and                            oxygen saturations were monitored continuously. The                            Colonoscope was introduced through the anus and                            advanced to the the cecum, identified by                            appendiceal orifice and ileocecal valve. The                            ileocecal valve, appendiceal orifice, and rectum                            were photographed. The quality of the bowel                            preparation was inadequate. The colonoscopy was                            performed without difficulty. The patient tolerated                            the procedure well. Scope In: 1:45:34 PM Scope Out: 2:09:59 PM Scope Withdrawal Time: 0 hours 20 minutes 4 seconds  Total Procedure Duration: 0 hours 24 minutes 25 seconds  Findings:                 The perianal and digital rectal examinations were                            normal.                           Three sessile polyps were found in the descending                            colon and transverse colon. The polyps were 7 to 8                            mm in size. These polyps were removed with a cold                            snare. Resection and retrieval were complete.  There was evidence of a prior end-to-side                            colo-colonic anastomosis in the descending colon.                            This was patent and was characterized by healthy                            appearing mucosa. The anastomosis was traversed.                           Internal hemorrhoids were found during                            retroflexion. The hemorrhoids were small and Grade                            I (internal hemorrhoids that do not prolapse).                           The exam was otherwise without abnormality on                            direct and retroflexion views. Complications:            No immediate complications. Estimated blood loss:                            None. Estimated Blood Loss:     Estimated blood loss: none. Impression:               - Preparation of the colon was inadequate.                           - Three 7 to 8 mm polyps in the  descending colon                            and in the transverse colon, removed with a cold                            snare. Resected and retrieved.                           - Patent end-to-side colo-colonic anastomosis,                            characterized by healthy appearing mucosa.                           - Internal hemorrhoids.                           - The examination was otherwise normal on direct  and retroflexion views. Recommendation:           - Repeat colonoscopy in 1 year for surveillance                            with a more extensive bowel prep.                           - Patient has a contact number available for                            emergencies. The signs and symptoms of potential                            delayed complications were discussed with the                            patient. Return to normal activities tomorrow.                            Written discharge instructions were provided to the                            patient.                           - Resume previous diet.                           - Continue present medications.                           - Await pathology results. Ladene Artist, MD 07/21/2017 2:14:28 PM This report has been signed electronically.

## 2017-07-22 ENCOUNTER — Telehealth: Payer: Self-pay | Admitting: *Deleted

## 2017-07-22 NOTE — Telephone Encounter (Signed)
  Follow up Call-  Call back number 07/21/2017  Post procedure Call Back phone  # [phone]  Permission to leave phone message Yes  Some recent data might be hidden     Patient questions:  Do you have a fever, pain , or abdominal swelling? No. Pain Score  0 *  Have you tolerated food without any problems? Yes.    Have you been able to return to your normal activities? Yes.    Do you have any questions about your discharge instructions: Diet   No. Medications  No. Follow up visit  No.  Do you have questions or concerns about your Care? No.  Actions: * If pain score is 4 or above: No action needed, pain <4.

## 2017-07-29 DIAGNOSIS — M1712 Unilateral primary osteoarthritis, left knee: Secondary | ICD-10-CM | POA: Diagnosis not present

## 2017-07-29 DIAGNOSIS — M25562 Pain in left knee: Secondary | ICD-10-CM | POA: Diagnosis not present

## 2017-08-03 DIAGNOSIS — M791 Myalgia, unspecified site: Secondary | ICD-10-CM | POA: Diagnosis not present

## 2017-08-03 DIAGNOSIS — M2550 Pain in unspecified joint: Secondary | ICD-10-CM | POA: Diagnosis not present

## 2017-08-03 DIAGNOSIS — Z85038 Personal history of other malignant neoplasm of large intestine: Secondary | ICD-10-CM | POA: Diagnosis not present

## 2017-08-03 DIAGNOSIS — M150 Primary generalized (osteo)arthritis: Secondary | ICD-10-CM | POA: Diagnosis not present

## 2017-08-03 DIAGNOSIS — R768 Other specified abnormal immunological findings in serum: Secondary | ICD-10-CM | POA: Diagnosis not present

## 2017-08-03 DIAGNOSIS — I10 Essential (primary) hypertension: Secondary | ICD-10-CM | POA: Diagnosis not present

## 2017-08-03 DIAGNOSIS — Z6828 Body mass index (BMI) 28.0-28.9, adult: Secondary | ICD-10-CM | POA: Diagnosis not present

## 2017-08-03 DIAGNOSIS — R683 Clubbing of fingers: Secondary | ICD-10-CM | POA: Diagnosis not present

## 2017-08-03 DIAGNOSIS — E663 Overweight: Secondary | ICD-10-CM | POA: Diagnosis not present

## 2017-08-05 ENCOUNTER — Encounter: Payer: Self-pay | Admitting: Gastroenterology

## 2017-08-28 DIAGNOSIS — N183 Chronic kidney disease, stage 3 (moderate): Secondary | ICD-10-CM | POA: Diagnosis not present

## 2017-08-28 DIAGNOSIS — E785 Hyperlipidemia, unspecified: Secondary | ICD-10-CM | POA: Diagnosis not present

## 2017-09-02 DIAGNOSIS — E785 Hyperlipidemia, unspecified: Secondary | ICD-10-CM | POA: Diagnosis not present

## 2017-09-02 DIAGNOSIS — N179 Acute kidney failure, unspecified: Secondary | ICD-10-CM | POA: Diagnosis not present

## 2017-09-02 DIAGNOSIS — I129 Hypertensive chronic kidney disease with stage 1 through stage 4 chronic kidney disease, or unspecified chronic kidney disease: Secondary | ICD-10-CM | POA: Diagnosis not present

## 2017-09-02 DIAGNOSIS — N183 Chronic kidney disease, stage 3 (moderate): Secondary | ICD-10-CM | POA: Diagnosis not present

## 2017-09-02 DIAGNOSIS — E669 Obesity, unspecified: Secondary | ICD-10-CM | POA: Diagnosis not present

## 2017-09-02 DIAGNOSIS — N2581 Secondary hyperparathyroidism of renal origin: Secondary | ICD-10-CM | POA: Diagnosis not present

## 2017-09-02 DIAGNOSIS — C187 Malignant neoplasm of sigmoid colon: Secondary | ICD-10-CM | POA: Diagnosis not present

## 2017-09-09 DIAGNOSIS — M722 Plantar fascial fibromatosis: Secondary | ICD-10-CM | POA: Diagnosis not present

## 2017-09-09 DIAGNOSIS — M25572 Pain in left ankle and joints of left foot: Secondary | ICD-10-CM | POA: Diagnosis not present

## 2017-09-09 DIAGNOSIS — M25562 Pain in left knee: Secondary | ICD-10-CM | POA: Diagnosis not present

## 2017-09-09 DIAGNOSIS — M1712 Unilateral primary osteoarthritis, left knee: Secondary | ICD-10-CM | POA: Diagnosis not present

## 2017-10-06 DIAGNOSIS — K4090 Unilateral inguinal hernia, without obstruction or gangrene, not specified as recurrent: Secondary | ICD-10-CM | POA: Diagnosis not present

## 2017-10-27 ENCOUNTER — Inpatient Hospital Stay: Admit: 2017-10-27 | Payer: Medicare Other | Admitting: Orthopedic Surgery

## 2017-10-27 SURGERY — ARTHROPLASTY, KNEE, TOTAL
Anesthesia: Spinal | Site: Knee | Laterality: Left

## 2017-11-13 ENCOUNTER — Telehealth: Payer: Self-pay

## 2017-11-13 ENCOUNTER — Inpatient Hospital Stay: Payer: Medicare Other | Attending: Oncology | Admitting: Oncology

## 2017-11-13 ENCOUNTER — Inpatient Hospital Stay: Payer: Medicare Other

## 2017-11-13 VITALS — BP 158/81 | HR 64 | Temp 97.7°F | Resp 19 | Ht 76.0 in | Wt 242.2 lb

## 2017-11-13 DIAGNOSIS — Z85038 Personal history of other malignant neoplasm of large intestine: Secondary | ICD-10-CM | POA: Diagnosis not present

## 2017-11-13 DIAGNOSIS — I129 Hypertensive chronic kidney disease with stage 1 through stage 4 chronic kidney disease, or unspecified chronic kidney disease: Secondary | ICD-10-CM | POA: Insufficient documentation

## 2017-11-13 DIAGNOSIS — C189 Malignant neoplasm of colon, unspecified: Secondary | ICD-10-CM

## 2017-11-13 DIAGNOSIS — N189 Chronic kidney disease, unspecified: Secondary | ICD-10-CM | POA: Diagnosis not present

## 2017-11-13 LAB — CEA (IN HOUSE-CHCC): CEA (CHCC-In House): 1.45 ng/mL (ref 0.00–5.00)

## 2017-11-13 NOTE — Telephone Encounter (Addendum)
Pt voiced understanding  ----- Message from Ladell Pier, MD sent at 11/13/2017 12:59 PM EDT ----- Please call patient, CEA is normal

## 2017-11-13 NOTE — Progress Notes (Signed)
  [facility] OFFICE PROGRESS NOTE   Diagnosis: Colon cancer  INTERVAL HISTORY:   Louis [name] returns for a scheduled visit.  He complains of "arthritis "in the left knee.  He also has arthralgias in other joints.  He has been evaluated by rheumatology and orthopedics.  He is scheduling a follow-up appointment with rheumatology.  He takes Tylenol arthritis in the mornings. He plans to undergo repair of the right inguinal hernia within the next few months.  He underwent a colonoscopy 07/21/2017.  The preparation was inadequate.  Polyps were removed from the descending and transverse colon. Objective:  Vital signs in last 24 hours:  Blood pressure (!) 158/81, pulse 64, temperature 97.7 F (36.5 C), temperature source Oral, resp. rate 19, height _0  (1.93 m), weight 242 lb 3.2 oz (109.9 kg), SpO2 98 %.    HEENT: Neck without mass Lymphatics: No cervical, supraclavicular, axillary, or inguinal nodes Resp: Lungs clear bilaterally Cardio: Regular rate and rhythm GI: No hepatosplenomegaly, no mass, soft right inguinal hernia Vascular: No leg edema     Lab Results:  Lab Results  Component Value Date   CEA1 2.02 01/27/2017    Lab Results  Component Value Date   INR 0.97 11/11/2016     Medications: I have reviewed the patient's current medications.   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 Sigmoid/descending resection and creation of a descending colostomy 04/10/2016 ? MSI-stable, no loss of mismatch repair protein expression ? Foundation 1-BRAF V600Epositive, MS-stable, intermediate tumor mutation burden, no RAS mutation ? CT abdomen/pelvis 04/02/2016-no evidence of distant metastatic disease ? Cycle 1 FOLFOX 05/21/2016 ? Cycle 2 FOLFOX 06/04/2016 ? Cycle 3 FOLFOX 06/18/2016 ? Cycle 4 FOLFOX 07/02/2016 (oxaliplatin held secondary to thrombocytopenia) ? Cycle 5 FOLFOX  07/16/2016 ? Cycle 6 FOLFOX 07/30/2016 ? Cycle 7 FOLFOX 08/13/2016 (oxaliplatin held and 5-FU dose reduced) ? Cycle 8 FOLFOX 09/03/2016 (oxaliplatin held secondary to neuropathy) ? Cycle 9 FOLFOX 09/17/2016 (oxaliplatin held secondary to neuropathy) ? Cycle 10 FOLFOX 10/02/2016 ? Cycle 11 FOLFOX 10/15/2016 (oxaliplatin eliminated from the regimen) ? Cycle 12 FOLFOX 10/30/2016 (oxaliplatin eliminated) ? CTs 05/12/2017-no evidence of recurrent disease, right inguinal hernia containing bladder ? Colonoscopy 07/21/2017, 3 polyps were removed from the descending and transverse colon, fragments of tubular and tubulovillous adenoma  2. Chronic renal insufficiency  3. Hypertension  4. Inflamed sebaceous cyst at the upper back-status post incision and drainage  5. Thrombocytopenia secondary chemotherapy-oxaliplatin dose reduced beginning with cycle 3 FOLFOX, oxaliplatin held with cycle 4 and cycle 7 FOLFOX  6. Oxaliplatin neuropathy-improved  7. History ofMucositis secondary chemotherapy  Disposition: Louis [name] is in clinical remission from colon cancer.  We will follow-up on the CEA from today.  He will return for an office visit and restaging CT scans in 6 months.  15 minutes were spent with the patient today.  The majority of the time was used for counseling and coordination of care.  Betsy Coder, MD  11/13/2017  12:26 PM

## 2017-11-16 ENCOUNTER — Telehealth: Payer: Self-pay

## 2017-11-16 NOTE — Telephone Encounter (Signed)
Spoke with patient concerning his upcoming appointments. Per 7/12 los. Mailed patient a calender and letter enclosed. These date and time was [facility]

## 2017-11-20 DIAGNOSIS — K4090 Unilateral inguinal hernia, without obstruction or gangrene, not specified as recurrent: Secondary | ICD-10-CM | POA: Diagnosis not present

## 2017-12-14 DIAGNOSIS — D225 Melanocytic nevi of trunk: Secondary | ICD-10-CM | POA: Diagnosis not present

## 2017-12-14 DIAGNOSIS — L905 Scar conditions and fibrosis of skin: Secondary | ICD-10-CM | POA: Diagnosis not present

## 2017-12-14 DIAGNOSIS — L821 Other seborrheic keratosis: Secondary | ICD-10-CM | POA: Diagnosis not present

## 2017-12-14 DIAGNOSIS — D2371 Other benign neoplasm of skin of right lower limb, including hip: Secondary | ICD-10-CM | POA: Diagnosis not present

## 2017-12-14 DIAGNOSIS — D2262 Melanocytic nevi of left upper limb, including shoulder: Secondary | ICD-10-CM | POA: Diagnosis not present

## 2017-12-14 DIAGNOSIS — D2272 Melanocytic nevi of left lower limb, including hip: Secondary | ICD-10-CM | POA: Diagnosis not present

## 2017-12-14 DIAGNOSIS — L570 Actinic keratosis: Secondary | ICD-10-CM | POA: Diagnosis not present

## 2017-12-14 DIAGNOSIS — D485 Neoplasm of uncertain behavior of skin: Secondary | ICD-10-CM | POA: Diagnosis not present

## 2017-12-14 DIAGNOSIS — D2271 Melanocytic nevi of right lower limb, including hip: Secondary | ICD-10-CM | POA: Diagnosis not present

## 2017-12-14 DIAGNOSIS — D2261 Melanocytic nevi of right upper limb, including shoulder: Secondary | ICD-10-CM | POA: Diagnosis not present

## 2017-12-16 DIAGNOSIS — C187 Malignant neoplasm of sigmoid colon: Secondary | ICD-10-CM | POA: Diagnosis not present

## 2017-12-16 DIAGNOSIS — N179 Acute kidney failure, unspecified: Secondary | ICD-10-CM | POA: Diagnosis not present

## 2017-12-16 DIAGNOSIS — I129 Hypertensive chronic kidney disease with stage 1 through stage 4 chronic kidney disease, or unspecified chronic kidney disease: Secondary | ICD-10-CM | POA: Diagnosis not present

## 2017-12-16 DIAGNOSIS — E785 Hyperlipidemia, unspecified: Secondary | ICD-10-CM | POA: Diagnosis not present

## 2017-12-16 DIAGNOSIS — E669 Obesity, unspecified: Secondary | ICD-10-CM | POA: Diagnosis not present

## 2017-12-16 DIAGNOSIS — N2581 Secondary hyperparathyroidism of renal origin: Secondary | ICD-10-CM | POA: Diagnosis not present

## 2017-12-16 DIAGNOSIS — N183 Chronic kidney disease, stage 3 (moderate): Secondary | ICD-10-CM | POA: Diagnosis not present

## 2017-12-29 ENCOUNTER — Ambulatory Visit: Payer: Self-pay | Admitting: General Surgery

## 2017-12-30 ENCOUNTER — Other Ambulatory Visit: Payer: Self-pay | Admitting: Nurse Practitioner

## 2018-01-06 NOTE — Pre-Procedure Instructions (Signed)
Louis [name]  01/06/2018      [id number] [address], [address] AT [facility] [address] [place] [address] Phone: [phone] Fax: [phone]    Your procedure is scheduled on September 9  Report to [facility] at [employer] A.M.  Call this number if you have problems the morning of surgery:  [phone]   Remember:  Do not eat or drink after midnight.      Take these medicines the morning of surgery with A SIP OF WATER  amLODipine (NORVASC) labetalol (NORMODYNE) loratadine (KLS ALLERCLEAR)   7 days prior to surgery STOP taking any Aspirin(unless otherwise instructed by your surgeon), Aleve, Naproxen, Ibuprofen, Motrin, Advil, Goody's, BC's, all herbal medications, fish oil, and all vitamins     Do not wear jewelry  Do not wear lotions, powders, or cologne, or deodorant.  Men may shave face and neck.  Do not bring valuables to the hospital.  [facility] is not responsible for any belongings or valuables.  Contacts, dentures or bridgework may not be worn into surgery.  Leave your suitcase in the car.  After surgery it may be brought to your room.  For patients admitted to the hospital, discharge time will be determined by your treatment team.  Patients discharged the day of surgery will not be allowed to drive home.    Special instructions:   [facility]- Preparing For Surgery  Before surgery, you can play an important role. Because skin is not sterile, your skin needs to be as free of germs as possible. You can reduce the number of germs on your skin by washing with CHG (chlorahexidine gluconate) Soap before surgery.  CHG is an antiseptic cleaner which kills germs and bonds with the skin to continue killing germs even after washing.    Oral Hygiene is also important to reduce your risk of infection.  Remember - BRUSH YOUR TEETH THE MORNING OF SURGERY WITH YOUR REGULAR  TOOTHPASTE  Please do not use if you have an allergy to CHG or antibacterial soaps. If your skin becomes reddened/irritated stop using the CHG.  Do not shave (including legs and underarms) for at least 48 hours prior to first CHG shower. It is OK to shave your face.  Please follow these instructions carefully.   1. Shower the NIGHT BEFORE SURGERY and the MORNING OF SURGERY with CHG.   2. If you chose to wash your hair, wash your hair first as usual with your normal shampoo.  3. After you shampoo, rinse your hair and body thoroughly to remove the shampoo.  4. Use CHG as you would any other liquid soap. You can apply CHG directly to the skin and wash gently with a scrungie or a clean washcloth.   5. Apply the CHG Soap to your body ONLY FROM THE NECK DOWN.  Do not use on open wounds or open sores. Avoid contact with your eyes, ears, mouth and genitals (private parts). Wash Face and genitals (private parts)  with your normal soap.  6. Wash thoroughly, paying special attention to the area where your surgery will be performed.  7. Thoroughly rinse your body with warm water from the neck down.  8. DO NOT shower/wash with your normal soap after using and rinsing off the CHG Soap.  9. Pat yourself dry with a CLEAN TOWEL.  10. Wear CLEAN PAJAMAS to bed the night before surgery, wear comfortable  clothes the morning of surgery  11. Place CLEAN SHEETS on your bed the night of your first shower and DO NOT SLEEP WITH PETS.    Day of Surgery:  Do not apply any deodorants/lotions.  Please wear clean clothes to the hospital/surgery center.   Remember to brush your teeth WITH YOUR REGULAR TOOTHPASTE.    Please read over the following fact sheets that you were given.

## 2018-01-07 ENCOUNTER — Ambulatory Visit (HOSPITAL_COMMUNITY)
Admission: RE | Admit: 2018-01-07 | Discharge: 2018-01-07 | Disposition: A | Discharge status: Home / Self Care | Payer: Medicare Other | Source: Ambulatory Visit | Attending: General Surgery | Admitting: General Surgery

## 2018-01-07 ENCOUNTER — Other Ambulatory Visit: Payer: Self-pay

## 2018-01-07 ENCOUNTER — Encounter (HOSPITAL_COMMUNITY): Payer: Self-pay

## 2018-01-07 ENCOUNTER — Encounter (HOSPITAL_COMMUNITY): Payer: Self-pay | Admitting: Physician Assistant

## 2018-01-07 ENCOUNTER — Encounter (HOSPITAL_COMMUNITY)
Admission: RE | Admit: 2018-01-07 | Discharge: 2018-01-07 | Disposition: A | Discharge status: Home / Self Care | Payer: Medicare Other | Source: Ambulatory Visit | Attending: General Surgery | Admitting: General Surgery

## 2018-01-07 DIAGNOSIS — I517 Cardiomegaly: Secondary | ICD-10-CM | POA: Insufficient documentation

## 2018-01-07 DIAGNOSIS — Z01818 Encounter for other preprocedural examination: Secondary | ICD-10-CM | POA: Diagnosis not present

## 2018-01-07 DIAGNOSIS — I447 Left bundle-branch block, unspecified: Secondary | ICD-10-CM | POA: Insufficient documentation

## 2018-01-07 DIAGNOSIS — R9431 Abnormal electrocardiogram [ECG] [EKG]: Secondary | ICD-10-CM | POA: Insufficient documentation

## 2018-01-07 DIAGNOSIS — R0989 Other specified symptoms and signs involving the circulatory and respiratory systems: Secondary | ICD-10-CM | POA: Diagnosis not present

## 2018-01-07 HISTORY — DX: Thrombocytopenia, unspecified: D69.6

## 2018-01-07 LAB — BASIC METABOLIC PANEL
Anion gap: 11 (ref 5–15)
BUN: 32 mg/dL — AB (ref 8–23)
CHLORIDE: 109 mmol/L (ref 98–111)
CO2: 20 mmol/L — ABNORMAL LOW (ref 22–32)
CREATININE: 2.36 mg/dL — AB (ref 0.61–1.24)
Calcium: 9.2 mg/dL (ref 8.9–10.3)
GFR, EST AFRICAN AMERICAN: 30 mL/min — AB (ref >60)
GFR, EST NON AFRICAN AMERICAN: 26 mL/min — AB (ref >60)
Glucose, Bld: 144 mg/dL — ABNORMAL HIGH (ref 70–99)
Potassium: 4.1 mmol/L (ref 3.5–5.1)
SODIUM: 140 mmol/L (ref 135–145)

## 2018-01-07 LAB — CBC WITH DIFFERENTIAL/PLATELET
Abs Immature Granulocytes: 0 10*3/uL (ref 0.0–0.1)
BASOS ABS: 0 10*3/uL (ref 0.0–0.1)
BASOS PCT: 1 %
EOS ABS: 0.3 10*3/uL (ref 0.0–0.7)
EOS PCT: 6 %
HEMATOCRIT: 43.4 % (ref 39.0–52.0)
Hemoglobin: 14 g/dL (ref 13.0–17.0)
IMMATURE GRANULOCYTES: 0 %
Lymphocytes Relative: 25 %
Lymphs Abs: 1.3 10*3/uL (ref 0.7–4.0)
MCH: 29.1 pg (ref 26.0–34.0)
MCHC: 32.3 g/dL (ref 30.0–36.0)
MCV: 90.2 fL (ref 78.0–100.0)
Monocytes Absolute: 0.5 10*3/uL (ref 0.1–1.0)
Monocytes Relative: 10 %
NEUTROS PCT: 58 %
Neutro Abs: 3 10*3/uL (ref 1.7–7.7)
Platelets: 223 10*3/uL (ref 150–400)
RBC: 4.81 MIL/uL (ref 4.22–5.81)
RDW: 14.7 % (ref 11.5–15.5)
WBC: 5.1 10*3/uL (ref 4.0–10.5)

## 2018-01-07 NOTE — Progress Notes (Addendum)
Anesthesia Chart Review:  Case:  [id number] Date/Time:  01/11/18 1015   Procedures:      OPEN RIGHT INGUINAL HERNIA REPAIR WITH MESH (Right )     INSERTION OF MESH (Right )   Anesthesia type:  General   Pre-op diagnosis:  right inguinal hernia   Location:  [facility] OR ROOM 02 / [facility]   Surgeon:  Judeth Horn, MD      DISCUSSION: 72 yo male never smoker for above procedure. Pertinent hx includes HTN, CKD, Colon CA s/p sigmoid resection s/p chemo now in remission, Thrombocytopenia secondary to chemo.  On PAT EKG pt was found to have new LBBB compared to EKG from 01/08/2017. He lists Dr. Marval Regal as nephrologist and PCP. I called and spoke with Dr. Marval Regal to see if this LBBB was known and investigated. He advised that as far as he knows this is a new LBBB and patient has never seen cardiology and he needs cardiac eval prior to any elective surgery.  I called triage nurse at Dr. Richarda Blade office and advised of new LBBB and need for cardiac eval, she stated she would inform Dr. Hulen Skains and will work on obtaining cardiac clearance.  Ability to proceed with surgery pending cardiac clearance. Chart left for nurse followup.  ADDENDUM 01/08/18: Case cancelled pending cardiac eval  VS: BP 137/69   Pulse 72   Temp 36.5 C   Resp 20   Ht 6\' 4"  (1.93 m)   Wt 111.8 kg   SpO2 98%   BMI 29.99 kg/m   PROVIDERS: Donato Heinz, MD is Nephrologist/PCP   LABS: Elevated creatinine consistent with pt's baseline of ~2.3 Creatinine, Ser (mg/dL)  Date Value  01/07/2018 2.36 (H)  01/13/2017 2.32 (H)  01/08/2017 2.32 (H)  11/11/2016 2.27 (H)   (all labs ordered are listed, but only abnormal results are displayed)  Labs Reviewed  BASIC METABOLIC PANEL - Abnormal; Notable for the following components:      Result Value   CO2 20 (*)    Glucose, Bld 144 (*)    BUN 32 (*)    Creatinine, Ser 2.36 (*)    GFR calc non Af Amer 26 (*)    GFR calc Af Amer 30 (*)    All other components within normal limits   CBC WITH DIFFERENTIAL/PLATELET     IMAGES: CHEST - 2 VIEW 01/07/2018  COMPARISON:  07/16/2016 chest radiograph  FINDINGS: Mild cardiomegaly again noted.  There is no evidence of focal airspace disease, pulmonary edema, suspicious pulmonary nodule/mass, pleural effusion, or pneumothorax.  No acute bony abnormalities are identified.  IMPRESSION: Mild cardiomegaly without evidence of active cardiopulmonary disease.  EKG: 01/07/2018: Sinus rhythm with occasional Premature ventricular complexes. Left bundle branch block new since last tracing.  CV: N/A  Past Medical History:  Diagnosis Date  . Allergy   . Arthritis   . CKD (chronic kidney disease) stage 2, GFR 60-89 ml/min    see [place], Stage 1  . colon ca dx'd 04/2016   colon  . Heart murmur    many years   . Hernia, abdominal   . Hyperlipidemia   . Hypertension   . Ileus ([place])   . Small bowel obstruction ([place])   . Thrombocytopenia ([place])    during chemo.    Past Surgical History:  Procedure Laterality Date  . BIOPSY  04/10/2016   Procedure: BIOPSY Mesentary;  Surgeon: Judeth Horn, MD;  Location: [facility];  Service: General;;  . COLOSTOMY    .  COLOSTOMY REVERSAL N/A 01/12/2017   Procedure: COLOSTOMY REVERSAL;  Surgeon: Judeth Horn, MD;  Location: [facility];  Service: General;  Laterality: N/A;  . FLEXIBLE SIGMOIDOSCOPY N/A 04/09/2016   Procedure: Beryle Quant;  Surgeon: Ladene Artist, MD;  Location: [facility] ENDOSCOPY;  Service: Endoscopy;  Laterality: N/A;  . HERNIA REPAIR     in childhood  . IR GENERIC HISTORICAL  05/14/2016   IR US GUIDE VASC ACCESS RIGHT 05/14/2016 Corrie Mckusick, DO [facility]INTER[facility]  . IR GENERIC HISTORICAL  05/14/2016   IR FLUORO GUIDE PORT INSERTION RIGHT 05/14/2016 Corrie Mckusick, DO [facility]  . IR REMOVAL TUN ACCESS W/ PORT W/O FL MOD SED  11/11/2016  . PARTIAL COLECTOMY N/A 04/10/2016   Procedure: SIGMOID  COLECTOMY WITH COLOSTOMY;  Surgeon: Judeth Horn, MD;  Location: [facility];  Service:  General;  Laterality: N/A;  . TONSILLECTOMY      MEDICATIONS: . acetaminophen (TYLENOL 8 HOUR ARTHRITIS PAIN) 650 MG CR tablet  . amLODipine (NORVASC) 10 MG tablet  . amoxicillin (AMOXIL) 500 MG capsule  . labetalol (NORMODYNE) 200 MG tablet  . loratadine (KLS ALLERCLEAR) 10 MG tablet  . Omega-3 Fatty Acids (FISH OIL) 1200 MG CAPS   . 0.9 %  sodium chloride infusion     Wynonia Musty [facility] Short Stay Center/Anesthesiology Phone [phone] 01/07/2018 4:21 PM

## 2018-01-07 NOTE — Progress Notes (Signed)
Mr Hinde denies chest pain, denies shortness of breath at rest.  [facility], patient was deconditioned during chemo.  Patient does not see a cardiologist.  PCP is also his nephrologist, Dr Guillermina [place].

## 2018-01-08 MED ORDER — BUPIVACAINE LIPOSOME 1.3 % IJ SUSP
20.0000 mL | INTRAMUSCULAR | Status: DC
  Filled 2018-01-08: qty 20

## 2018-01-11 ENCOUNTER — Ambulatory Visit (HOSPITAL_COMMUNITY): Admission: RE | Admit: 2018-01-11 | Payer: Medicare Other | Source: Ambulatory Visit | Admitting: General Surgery

## 2018-01-11 ENCOUNTER — Encounter (HOSPITAL_COMMUNITY): Admission: RE | Payer: Self-pay | Source: Ambulatory Visit

## 2018-01-11 SURGERY — REPAIR, HERNIA, INGUINAL, ADULT
Anesthesia: General | Laterality: Right

## 2018-01-12 ENCOUNTER — Encounter: Payer: Self-pay | Admitting: Cardiology

## 2018-01-12 ENCOUNTER — Ambulatory Visit (INDEPENDENT_AMBULATORY_CARE_PROVIDER_SITE_OTHER): Payer: Medicare Other | Admitting: Cardiology

## 2018-01-12 VITALS — BP 128/76 | HR 72 | Ht 76.0 in | Wt 247.1 lb

## 2018-01-12 DIAGNOSIS — R9431 Abnormal electrocardiogram [ECG] [EKG]: Secondary | ICD-10-CM | POA: Diagnosis not present

## 2018-01-12 DIAGNOSIS — I447 Left bundle-branch block, unspecified: Secondary | ICD-10-CM

## 2018-01-12 DIAGNOSIS — Z01818 Encounter for other preprocedural examination: Secondary | ICD-10-CM

## 2018-01-12 DIAGNOSIS — I7090 Unspecified atherosclerosis: Secondary | ICD-10-CM | POA: Diagnosis not present

## 2018-01-12 DIAGNOSIS — I10 Essential (primary) hypertension: Secondary | ICD-10-CM | POA: Diagnosis not present

## 2018-01-12 NOTE — Progress Notes (Signed)
Cardiology Office Note:    Date:  01/12/2018   ID:  Louis [name], DOB 25-Apr-1946, MRN [id number]  PCP:  Louis Heinz, MD  Cardiologist:  Jenean Lindau, MD   Referring MD: Louis Heinz, MD    ASSESSMENT:    1. New onset left bundle branch block (LBBB)   2. Pre-operative clearance   3. Essential hypertension   4. Atherosclerotic vascular disease    PLAN:    In order of problems listed above:  1. I discussed my findings with the patient at extensive length.  Patient has evidence of aortic atherosclerosis on the CT scan.  Secondary prevention stressed to him.  Importance of compliance with diet and medication stressed and he vocalized understanding.  He needs to take coated baby aspirin on a regular basis but I will leave it up to his primary care physician as he has history of colon cancer for which he has undergone chemotherapy.  Patient also has aortic atherosclerosis and needs to be on statin therapy and I would leave this to the discretion of his primary care physician who is his long-term care provider. 2. Echocardiogram will be done to assess his murmur on auscultation.  Patient will undergo Lexiscan sestamibi for newly diagnosed left bundle branch block.  He has some shortness of breath on exertion which she told me only when I questioned him extensively about this.  He leads a very sedentary lifestyle and has multiple risk factors.  Again he is under the care of her nephrologist for his renal issues and I am sure issues such as ACE inhibition have been pursued by them. 3. Patient will be seen in follow-up appointment in 6 months or earlier if the patient has any concerns    Medication Adjustments/Labs and Tests Ordered: Current medicines are reviewed at length with the patient today.  Concerns regarding medicines are outlined above.  Orders Placed This Encounter  Procedures  . MYOCARDIAL PERFUSION IMAGING  . EKG 12-Lead  . ECHOCARDIOGRAM COMPLETE   No orders  of the defined types were placed in this encounter.    History of Present Illness:    Louis [name] is a 72 y.o. male who is being seen today for the evaluation of left bundle branch block which is newly diagnosed at the request of Louis Heinz, MD.  Patient is a pleasant 72 year old male.  He is accompanied by his wife for this visit today.  Patient has essential hypertension and advanced renal insufficiency followed by nephrologist.  He is here because his EKG has revealed left bundle branch block and it is new.  He denies any chest pain orthopnea or PND but leads a sedentary life because of arthritis issues.  He works full-time.  No syncope or any such problems.  At the time of my evaluation, the patient is alert awake oriented and in no distress.  Past Medical History:  Diagnosis Date  . Allergy   . Arthritis   . CKD (chronic kidney disease) stage 2, GFR 60-89 ml/min    see [place], Stage 1  . colon ca dx'd 04/2016   colon  . Heart murmur    many years   . Hernia, abdominal   . Hyperlipidemia   . Hypertension   . Ileus ([place])   . Small bowel obstruction ([place])   . Thrombocytopenia ([place])    during chemo.    Past Surgical History:  Procedure Laterality Date  . BIOPSY  04/10/2016   Procedure: BIOPSY Mesentary;  Surgeon:  Judeth Horn, MD;  Location: [place];  Service: General;;  . COLOSTOMY    . COLOSTOMY REVERSAL N/A 01/12/2017   Procedure: COLOSTOMY REVERSAL;  Surgeon: Judeth Horn, MD;  Location: [facility];  Service: General;  Laterality: N/A;  . FLEXIBLE SIGMOIDOSCOPY N/A 04/09/2016   Procedure: Beryle Quant;  Surgeon: Ladene Artist, MD;  [facility] ENDOSCOPY;  Service: Endoscopy;  Laterality: N/A;  . HERNIA REPAIR     in childhood  . IR GENERIC HISTORICAL  05/14/2016   IR US GUIDE VASC ACCESS RIGHT 05/14/2016 Corrie Mckusick, DO [facility]  . IR GENERIC HISTORICAL  05/14/2016   IR FLUORO GUIDE PORT INSERTION RIGHT 05/14/2016 Corrie Mckusick, DO [facility]  .  IR REMOVAL TUN ACCESS W/ PORT W/O FL MOD SED  11/11/2016  . PARTIAL COLECTOMY N/A 04/10/2016   Procedure: SIGMOID  COLECTOMY WITH COLOSTOMY;  Surgeon: Judeth Horn, MD;  Location: [facility] [facility];  Service: General;  Laterality: N/A;  . TONSILLECTOMY      Current Medications: Current Meds  Medication Sig  . acetaminophen (TYLENOL 8 HOUR ARTHRITIS PAIN) 650 MG CR tablet Take 650 mg by mouth every morning.   Marland Kitchen amLODipine (NORVASC) 10 MG tablet Take 10 mg by mouth daily.  Marland Kitchen labetalol (NORMODYNE) 200 MG tablet Take 200 mg by mouth 3 (three) times daily.   . Loratadine (CLARITIN) 10 MG CAPS Claritin   Current Facility-Administered Medications for the 01/12/18 encounter (Office Visit) with Deondrae Mcgrail, Reita Cliche, MD  Medication  . 0.9 %  sodium chloride infusion     Allergies:   Patient has no known allergies.   Social History   Socioeconomic History  . Marital status: Married    Spouse name: Not on file  . Number of children: Not on file  . Years of education: Not on file  . Highest education level: Not on file  Occupational History  . Not on file  Social Needs  . Financial resource strain: Not on file  . Food insecurity:    Worry: Not on file    Inability: Not on file  . Transportation needs:    Medical: Not on file    Non-medical: Not on file  Tobacco Use  . Smoking status: Never Smoker  . Smokeless tobacco: Never Used  Substance and Sexual Activity  . Alcohol use: Yes    Alcohol/week: 1.0 - 2.0 standard drinks    Types: 1 - 2 Glasses of wine per week    Comment: once a week  . Drug use: No  . Sexual activity: Not on file  Lifestyle  . Physical activity:    Days per week: Not on file    Minutes per session: Not on file  . Stress: Not on file  Relationships  . Social connections:    Talks on phone: Not on file    Gets together: Not on file    Attends religious service: Not on file    Active member of club or organization: Not on file    Attends meetings of clubs or organizations:  Not on file    Relationship status: Not on file  Other Topics Concern  . 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 There is no history of Colon cancer, Esophageal cancer, Rectal cancer,  or Stomach cancer.  ROS:   Please see the history of present illness.    All other systems reviewed and are negative.  EKGs/Labs/Other Studies Reviewed:    The following studies were reviewed today: EKG reveals sinus rhythm and left bundle branch block.   Recent Labs: 01/07/2018: BUN 32; Creatinine, Ser 2.36; Hemoglobin 14.0; Platelets 223; Potassium 4.1; Sodium 140  Recent Lipid Panel    Component Value Date/Time   CHOL 159 07/02/2016 0808   TRIG 146 07/02/2016 0808   HDL 47 07/02/2016 0808   CHOLHDL 3.4 07/02/2016 0808   LDLCALC 83 07/02/2016 0808    Physical Exam:    VS:  BP 128/76   Pulse 72   Ht 6\' 4"  (1.93 m)   Wt 247 lb 1.1 oz (112.1 kg)   BMI 30.07 kg/m     Wt Readings from Last 3 Encounters:  01/12/18 247 lb 1.1 oz (112.1 kg)  01/07/18 246 lb 6.4 oz (111.8 kg)  11/13/17 242 lb 3.2 oz (109.9 kg)     GEN: Patient is in no acute distress HEENT: Normal NECK: No JVD; No carotid bruits LYMPHATICS: No lymphadenopathy CARDIAC: S1 S2 regular, 2/6 systolic murmur at the apex. RESPIRATORY:  Clear to auscultation without rales, wheezing or rhonchi  ABDOMEN: Soft, non-tender, non-distended MUSCULOSKELETAL:  No edema; No deformity  SKIN: Warm and dry NEUROLOGIC:  Alert and oriented x 3 PSYCHIATRIC:  Normal affect    Signed, Jenean Lindau, MD  01/12/2018 9:39 AM    [place]

## 2018-01-12 NOTE — Patient Instructions (Signed)
Medication Instructions:  Your physician recommends that you continue on your current medications as directed. Please refer to the Current Medication list given to you today.  Labwork: None  Testing/Procedures: Your physician has requested that you have an echocardiogram. Echocardiography is a painless test that uses sound waves to create images of your heart. It provides your doctor with information about the size and shape of your heart and how well your heart's chambers and valves are working. This procedure takes approximately one hour. There are no restrictions for this procedure.  Your physician has requested that you have a lexiscan myoview. For further information please visit [email or web address]. Please follow instruction sheet, as given.  Follow-Up: Your physician recommends that you schedule a follow-up appointment in: 6 months  Any Other Special Instructions Will Be Listed Below (If Applicable).     If you need a refill on your cardiac medications before your next appointment, please call your pharmacy.   [place], RN, BSN

## 2018-01-14 ENCOUNTER — Telehealth (HOSPITAL_COMMUNITY): Payer: Self-pay | Admitting: *Deleted

## 2018-01-14 NOTE — Telephone Encounter (Signed)
Left message on voicemail per DPR in reference to upcoming appointment scheduled on 01/19/18 with detailed instructions given per Myocardial Perfusion Study Information Sheet for the test. LM to arrive 15 minutes early, and that it is imperative to arrive on time for appointment to keep from having the test rescheduled. If you need to cancel or reschedule your appointment, please call the office within 24 hours of your appointment. Failure to do so may result in a cancellation of your appointment, and a $50 no show fee. Phone number given for call back for any questions. Kirstie Peri

## 2018-01-19 ENCOUNTER — Ambulatory Visit (HOSPITAL_BASED_OUTPATIENT_CLINIC_OR_DEPARTMENT_OTHER): Payer: Medicare Other

## 2018-01-19 ENCOUNTER — Ambulatory Visit (HOSPITAL_COMMUNITY): Payer: Medicare Other | Attending: Cardiology

## 2018-01-19 VITALS — Ht 76.0 in | Wt 247.0 lb

## 2018-01-19 DIAGNOSIS — N189 Chronic kidney disease, unspecified: Secondary | ICD-10-CM | POA: Insufficient documentation

## 2018-01-19 DIAGNOSIS — I447 Left bundle-branch block, unspecified: Secondary | ICD-10-CM | POA: Insufficient documentation

## 2018-01-19 DIAGNOSIS — I1310 Hypertensive heart and chronic kidney disease without heart failure, with stage 1 through stage 4 chronic kidney disease, or unspecified chronic kidney disease: Secondary | ICD-10-CM | POA: Diagnosis not present

## 2018-01-19 DIAGNOSIS — I2510 Atherosclerotic heart disease of native coronary artery without angina pectoris: Secondary | ICD-10-CM | POA: Diagnosis not present

## 2018-01-19 DIAGNOSIS — E785 Hyperlipidemia, unspecified: Secondary | ICD-10-CM | POA: Diagnosis not present

## 2018-01-19 DIAGNOSIS — Z8249 Family history of ischemic heart disease and other diseases of the circulatory system: Secondary | ICD-10-CM | POA: Diagnosis not present

## 2018-01-19 DIAGNOSIS — R0609 Other forms of dyspnea: Secondary | ICD-10-CM | POA: Diagnosis not present

## 2018-01-19 DIAGNOSIS — R011 Cardiac murmur, unspecified: Secondary | ICD-10-CM | POA: Diagnosis not present

## 2018-01-19 DIAGNOSIS — I77810 Thoracic aortic ectasia: Secondary | ICD-10-CM | POA: Insufficient documentation

## 2018-01-19 LAB — MYOCARDIAL PERFUSION IMAGING
CHL CUP NUCLEAR SRS: 0
CHL CUP NUCLEAR SSS: 3
LV dias vol: 133 mL (ref 62–150)
LVSYSVOL: 72 mL
Peak HR: 86 {beats}/min
Rest HR: 61 {beats}/min
SDS: 3
TID: 0.97

## 2018-01-19 LAB — ECHOCARDIOGRAM COMPLETE
Height: 76 in
WEIGHTICAEL: 3952 [oz_av]

## 2018-01-19 MED ORDER — REGADENOSON 0.4 MG/5ML IV SOLN
0.4000 mg | Freq: Once | INTRAVENOUS | Status: AC
  Administered 2018-01-19: 0.4 mg via INTRAVENOUS

## 2018-01-19 MED ORDER — TECHNETIUM TC 99M TETROFOSMIN IV KIT
8.4000 | PACK | Freq: Once | INTRAVENOUS | Status: AC | PRN
  Administered 2018-01-19: 8.4 via INTRAVENOUS
  Filled 2018-01-19: qty 9

## 2018-01-19 MED ORDER — TECHNETIUM TC 99M TETROFOSMIN IV KIT
31.6000 | PACK | Freq: Once | INTRAVENOUS | Status: AC | PRN
  Administered 2018-01-19: 31.6 via INTRAVENOUS
  Filled 2018-01-19: qty 32

## 2018-01-20 ENCOUNTER — Telehealth: Payer: Self-pay

## 2018-01-20 IMAGING — CT CT ABD-PELV W/O CM
2 of 5 series · 14 of 46 positions shown, 16 images · non-contrast
Comparison: None.

CLINICAL DATA: Two week history of abdominal distention with
nausea, vomiting common diarrhea

EXAM:
CT ABDOMEN AND PELVIS WITHOUT CONTRAST
TECHNIQUE: Multidetector CT imaging of the abdomen and pelvis was performed
following the standard protocol without IV contrast. Oral contrast
was administered.

[Series 2: a/p w/o 5mm · axial · non-contrast · 0.90mm/px · z∈[+800,+1290]mm · 11 of 110 slices shown, 13 images]
[im 6/110  soft-tissue]
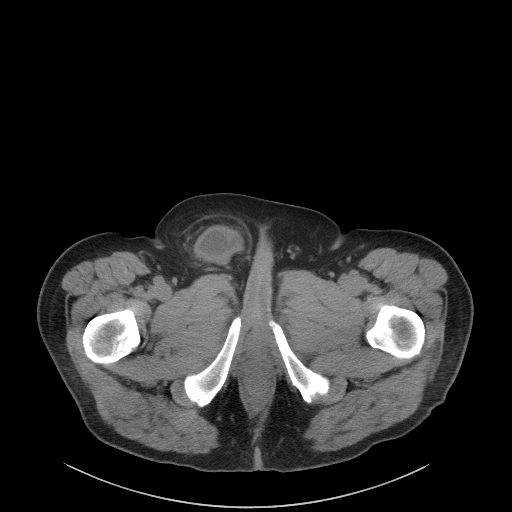
[im 6/110  bone]
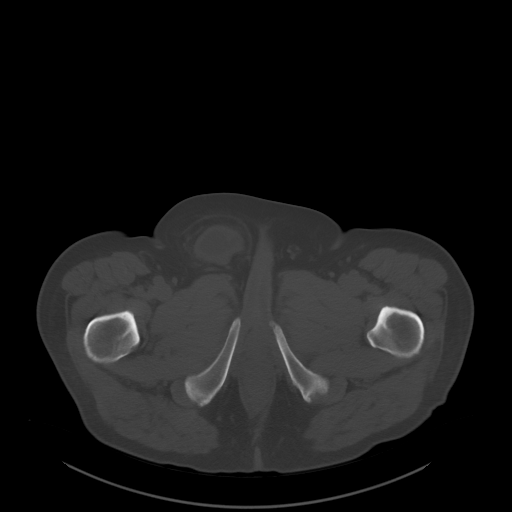
[im 17/110  soft-tissue]
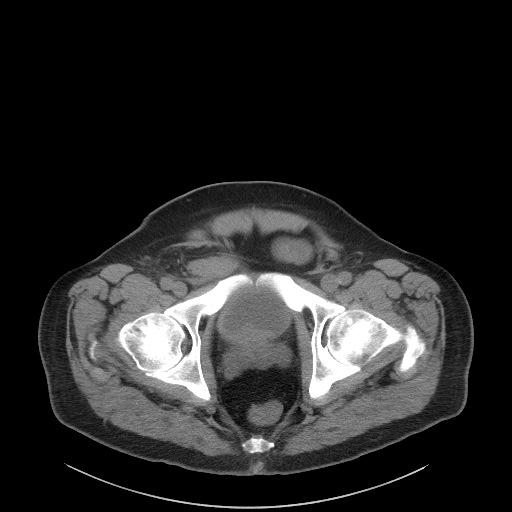
[im 28/110  soft-tissue]
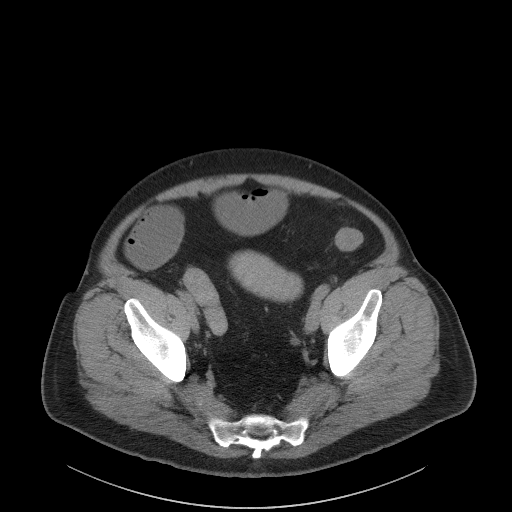
[im 39/110  soft-tissue]
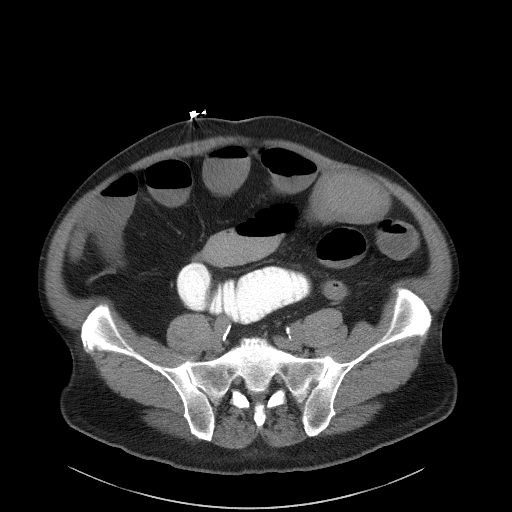
[im 44/110  soft-tissue]
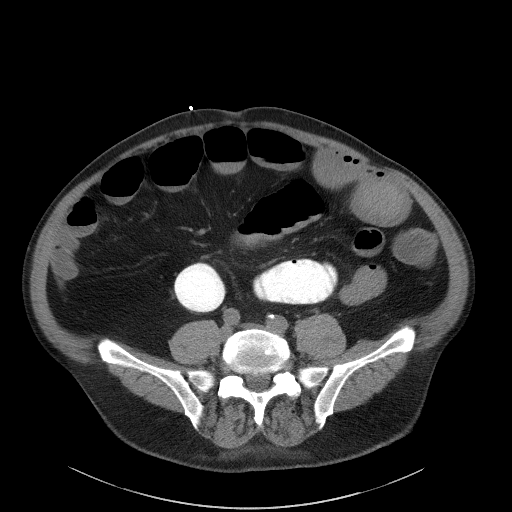
[im 55/110  soft-tissue]
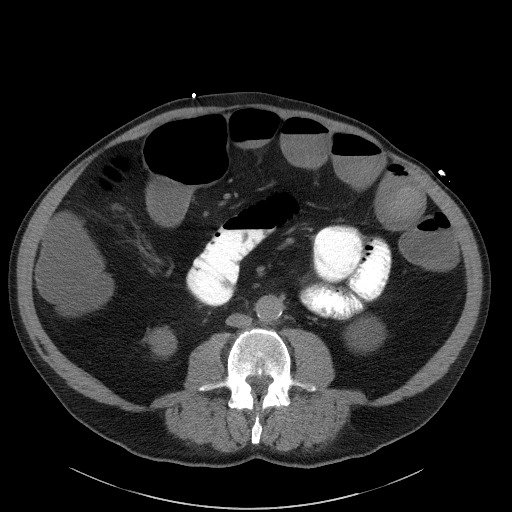
[im 66/110  soft-tissue]
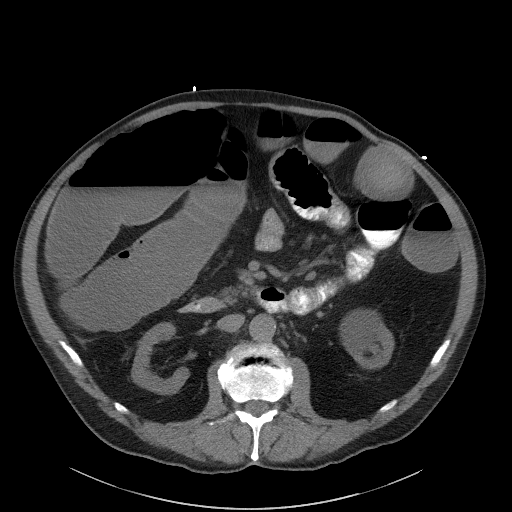
[im 71/110  soft-tissue]
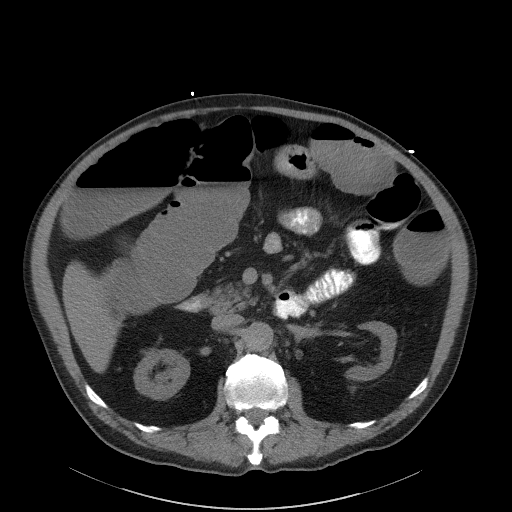
[im 82/110  soft-tissue]
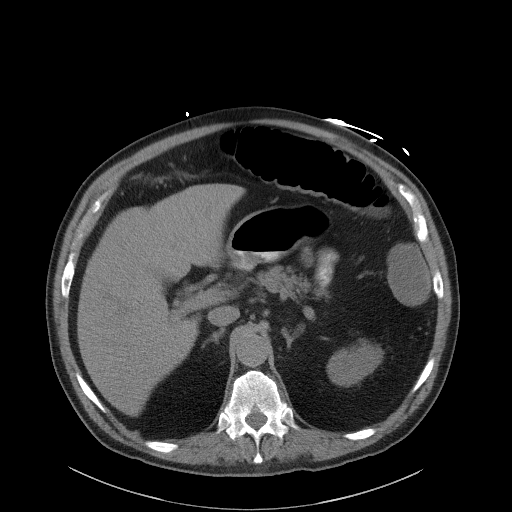
[im 82/110  bone]
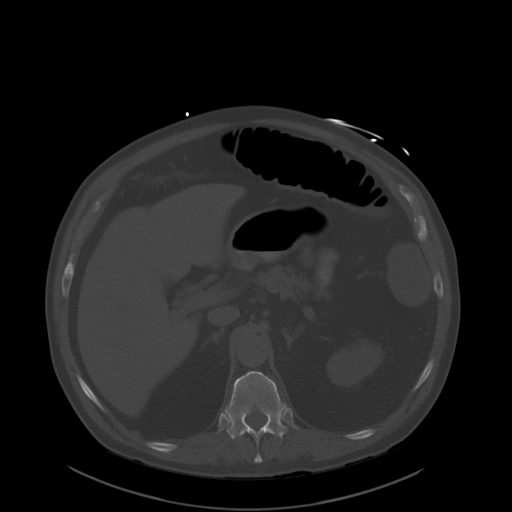
[im 93/110  soft-tissue]
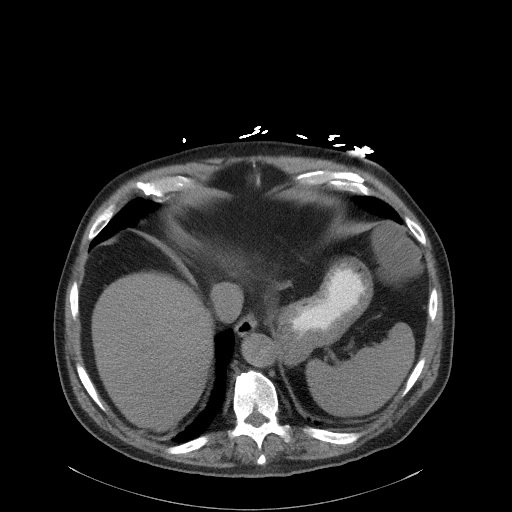
[im 104/110  soft-tissue]
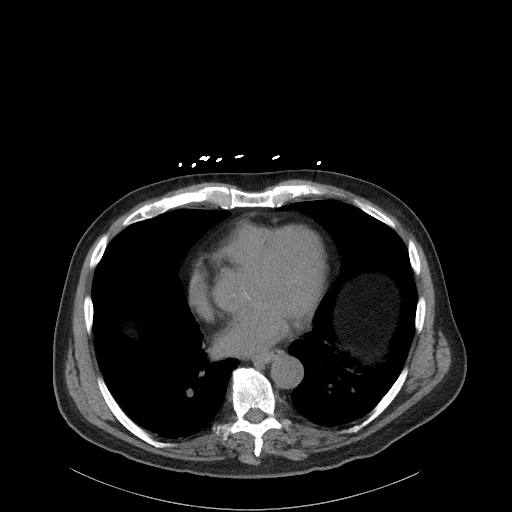

[Series 5: a/p w/o cor · coronal · non-contrast · 0.90mm/px · 3 of 183 slices shown]
[im 61/183  soft-tissue]
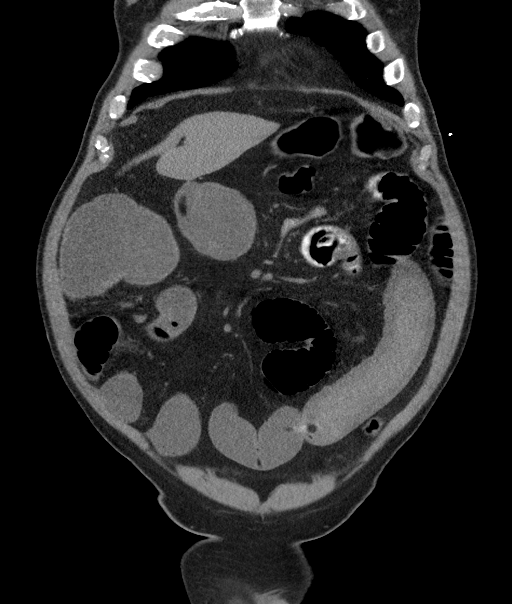
[im 81/183  soft-tissue]
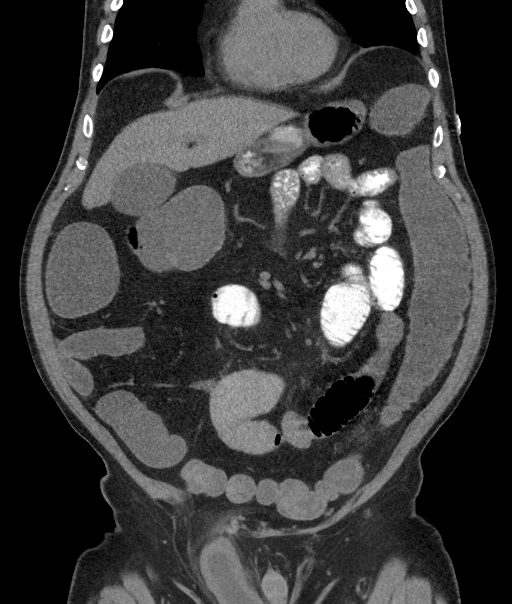
[im 102/183  soft-tissue]
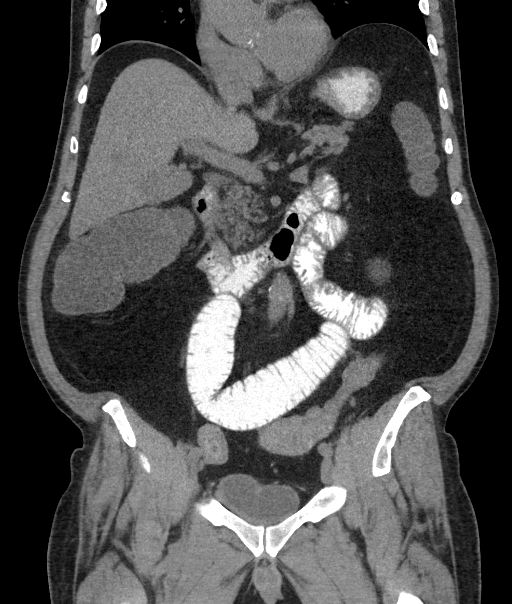

[14 of 46 positions shown; findings below may reference images not displayed]

FINDINGS: Lower chest: There is patchy bibasilar atelectasis. There are
scattered foci of coronary artery calcification.

Hepatobiliary: No focal liver lesions are evident on this non
intravenous contrast enhanced study. The gallbladder appears
distended without appreciable gallbladder wall thickening. There is
no biliary duct dilatation.

Pancreas: There is no pancreatic mass or inflammatory focus.

Spleen: No splenic lesions are evident.

Adrenals/Urinary Tract: Adrenals appear normal bilaterally. There is
a cyst arising from the lower pole of the left kidney measuring
x 6.1 cm. There is a cyst arising from the posterior mid right
kidney measuring 2.3 x 2.2 cm. There is an immediately adjacent 1 x
1 cm cyst in the right kidney in this region. There is no renal
hydronephrosis on either side. There is no renal or ureteral
calculus on either side. Urinary bladder is midline with a portion
of the urinary bladder extending into a right inguinal hernia.
Portions of the urinary bladder have a thickened wall.

Stomach/Bowel: The stomach is nondistended. The proximal small bowel
is nondistended. There is dilatation of small bowel from the level
of the proximal to mid jejunum throughout its course. There is
distention of most of the colon. There is a significant amount of
fluid throughout the colon as well as much of the distal small
bowel. A well-defined transition zone suggesting a focal site of
small or large bowel obstruction is not appreciated. There is no
bowel wall or mesenteric thickening. There is no free air or portal
venous air.

Vascular/Lymphatic: There is atherosclerotic calcification in the
aorta and common iliac arteries. There is no demonstrable abdominal
aortic aneurysm. Major mesenteric arterial vessels appear patent on
this noncontrast enhanced study. There is no appreciable adenopathy
in the abdomen or pelvis.

Reproductive: The prostate appears mildly enlarged and is difficult
to separate from the inferior urinary bladder. There are several
prostatic calculi. The seminal vesicles appear unremarkable. No
pelvic mass.

Other: Appendix appears unremarkable without inflammation. No
abscess or ascites is evident in the abdomen or pelvis. There is a
right inguinal hernia which contains a portion of a prolapsed
urinary bladder with wall thickening in the bladder. There is fat in
the left inguinal ring. There is a minimal ventral hernia containing
only fat.

Musculoskeletal: There is degenerative change in the lumbar spine.
There is moderate spinal stenosis at L3-4 due to bony hypertrophy
and diffuse disc protrusion. There are no blastic or lytic bone
lesions. There is no intramuscular or abdominal wall lesion.
IMPRESSION: Multiple loops of dilated small and large bowel with fluid
throughout much of the bowel. A well-defined transition zone is not
seen. There is no appreciable bowel wall thickening. No bowel
pneumatosis evident. The appearance is consistent with ileus, likely
with associated enterocolitis.

There is a right inguinal hernia which contains a portion of the
urinary bladder. There is wall thickening in much of the urinary
bladder suggesting a degree of cystitis.

Prostate appears prominent and cannot be separated from the inferior
bladder. This finding warrants direct physical examination and
correlation with PSA. Prostate carcinoma in this circumstance cannot
be excluded by CT.

There is aortoiliac atherosclerosis. There are foci of coronary
artery calcification.

Gallbladder appears somewhat distended without wall thickening.

Renal cysts bilaterally. Largest cyst arises from the left kidney
measuring 7.0 x 6.1 cm.

Moderate spinal stenosis at L3-4, multifactorial.

## 2018-01-20 NOTE — Telephone Encounter (Signed)
Called patient and left detailed voice message on phone regarding results.

## 2018-01-20 NOTE — Telephone Encounter (Signed)
-----   Message from Jenean Lindau, MD sent at 01/19/2018  4:51 PM EDT ----- The results of the study is unremarkable. Please inform patient. I will discuss in detail at next appointment. Cc  primary care/referring physician Jenean Lindau, MD 01/19/2018 4:51 PM

## 2018-01-20 NOTE — Telephone Encounter (Signed)
-----   Message from Jenean Lindau, MD sent at 01/20/2018  8:23 AM EDT ----- The results of the study is unremarkable. Please inform patient. I will discuss in detail at next appointment. Cc  primary care/referring physician Jenean Lindau, MD 01/20/2018 8:23 AM

## 2018-02-02 DIAGNOSIS — M791 Myalgia, unspecified site: Secondary | ICD-10-CM | POA: Diagnosis not present

## 2018-02-02 DIAGNOSIS — M150 Primary generalized (osteo)arthritis: Secondary | ICD-10-CM | POA: Diagnosis not present

## 2018-02-02 DIAGNOSIS — M2550 Pain in unspecified joint: Secondary | ICD-10-CM | POA: Diagnosis not present

## 2018-02-02 DIAGNOSIS — R683 Clubbing of fingers: Secondary | ICD-10-CM | POA: Diagnosis not present

## 2018-02-02 DIAGNOSIS — R768 Other specified abnormal immunological findings in serum: Secondary | ICD-10-CM | POA: Diagnosis not present

## 2018-02-02 DIAGNOSIS — Z6830 Body mass index (BMI) 30.0-30.9, adult: Secondary | ICD-10-CM | POA: Diagnosis not present

## 2018-02-02 DIAGNOSIS — E669 Obesity, unspecified: Secondary | ICD-10-CM | POA: Diagnosis not present

## 2018-02-02 DIAGNOSIS — R29898 Other symptoms and signs involving the musculoskeletal system: Secondary | ICD-10-CM | POA: Diagnosis not present

## 2018-02-02 DIAGNOSIS — Z85038 Personal history of other malignant neoplasm of large intestine: Secondary | ICD-10-CM | POA: Diagnosis not present

## 2018-02-02 DIAGNOSIS — I10 Essential (primary) hypertension: Secondary | ICD-10-CM | POA: Diagnosis not present

## 2018-02-11 ENCOUNTER — Ambulatory Visit: Payer: Medicare Other | Attending: Physician Assistant | Admitting: Physical Therapy

## 2018-02-11 ENCOUNTER — Encounter: Payer: Self-pay | Admitting: Physical Therapy

## 2018-02-11 ENCOUNTER — Other Ambulatory Visit: Payer: Self-pay

## 2018-02-11 DIAGNOSIS — M6281 Muscle weakness (generalized): Secondary | ICD-10-CM | POA: Diagnosis not present

## 2018-02-11 DIAGNOSIS — M25561 Pain in right knee: Secondary | ICD-10-CM | POA: Diagnosis not present

## 2018-02-11 DIAGNOSIS — M25562 Pain in left knee: Secondary | ICD-10-CM | POA: Insufficient documentation

## 2018-02-11 DIAGNOSIS — G8929 Other chronic pain: Secondary | ICD-10-CM | POA: Diagnosis not present

## 2018-02-11 NOTE — Therapy (Signed)
[facility] [address] [place], [place], [address] Phone: [phone]   Fax:  [phone]  Physical Therapy Evaluation  Patient Details  Name: Louis [name] MRN: [id number] Date of Birth: Nov 05, 1945 Referring Provider (PT): Marella Chimes [facility]   Encounter Date: 02/11/2018  PT End of Session - 02/11/18 0852    Visit Number  1    Date for PT Re-Evaluation  05/06/18    PT Start Time  0852    PT Stop Time  0930    PT Time Calculation (min)  38 min    Activity Tolerance  Patient tolerated treatment well    Behavior During Therapy  [facility] for tasks assessed/performed       Past Medical History:  Diagnosis Date  . Allergy   . Arthritis   . CKD (chronic kidney disease) stage 2, GFR 60-89 ml/min    see [place], Stage 1  . colon ca dx'd 04/2016   colon  . Heart murmur    many years   . Hernia, abdominal   . Hyperlipidemia   . Hypertension   . Ileus ([place])   . Small bowel obstruction ([place])   . 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    Past Surgical History:  Procedure Laterality Date  . BIOPSY  04/10/2016   Procedure: BIOPSY Mesentary;  Surgeon: Judeth Horn, MD;  Location: [place];  Service: General;;  . COLOSTOMY    . COLOSTOMY REVERSAL N/A 01/12/2017   Procedure: COLOSTOMY REVERSAL;  Surgeon: Judeth Horn, MD;  Location: [place];  Service: General;  Laterality: N/A;  . FLEXIBLE SIGMOIDOSCOPY N/A 04/09/2016   Procedure: Beryle Quant;  Surgeon: Ladene Artist, MD;  Location: [facility] ENDOSCOPY;  Service: Endoscopy;  Laterality: N/A;  . HERNIA REPAIR     in childhood  . IR GENERIC HISTORICAL  05/14/2016   IR US GUIDE VASC ACCESS RIGHT 05/14/2016 Corrie Mckusick, DO [facility]  . IR GENERIC HISTORICAL  05/14/2016   IR FLUORO GUIDE PORT INSERTION RIGHT 05/14/2016 Corrie Mckusick, DO [facility]  . IR REMOVAL TUN ACCESS W/ PORT W/O FL MOD SED  11/11/2016  . PARTIAL COLECTOMY N/A 04/10/2016   Procedure: SIGMOID   COLECTOMY WITH COLOSTOMY;  Surgeon: Judeth Horn, MD;  Location: [place];  Service: General;  Laterality: N/A;  . TONSILLECTOMY      There were no vitals filed for this visit.   Subjective Assessment - 02/11/18 0856    Subjective  Patient was diagnosed with colon CA in 2017 and had part of colon removed. He had 6 weeks of chemo and became deconditioned. He reports his knees are arthritic and are really bothering him, but also noticed hip and ankle pain. He works out intermittently at [employer] which helps. He reports no falls but one incidence of a near fall. Legs just gave out.    Pertinent History  colon cancer 2017, HTN, stage1 kidney disease, arthritis, left plantar facitis    How long can you stand comfortably?  10 minutes     How long can you walk comfortably?  no long distances; grocery store is fatigued after    Diagnostic tests  xrays - knees bone on bone    Patient Stated Goals  get stronger, decrease pain    Currently in Pain?  Yes    Pain Score  4     Pain Location  Knee    Pain Orientation  Right;Left    Pain Descriptors / Indicators  Aching    Pain Type  Chronic pain    Pain Onset  More than a month ago    Pain Frequency  Intermittent    Aggravating Factors   sitting     Pain Relieving Factors  walking, exercise         [facility] PT Assessment - 02/11/18 0001      Assessment   Medical Diagnosis  bil leg weakness, risk for falls, decondiitioning    Referring Provider (PT)  Marella Chimes [facility]    Onset Date/Surgical Date  01/03/18    Next MD Visit  6  months      Precautions   Precaution Comments  H/O colon cancer      Balance Screen   Has the patient fallen in the past 6 months  No    Has the patient had a decrease in activity level because of a fear of falling?   No    Is the patient reluctant to leave their home because of a fear of falling?   No      Home Film/video editor residence    Living Arrangements  Spouse/significant other    Available Help  at Discharge  Family    Type of [place] to live on main level with bedroom/bathroom    Additional Comments  difficulty with declines and descending stairs      Prior Function   Level of Independence  Independent    Vocation  Full time employment    Vocation Requirements  desk work mainly    Leisure  fishing      Posture/Postural Control   Posture Comments  stands in hip ER, left knee flex      ROM / Strength   AROM / PROM / Strength  AROM;Strength      AROM   Overall AROM Comments  L knee -11 to 117 deg flex   -10 passive ext     Strength   Strength Assessment Site  Hip;Knee    Right/Left Hip  Right;Left    Right Hip Flexion  4-/5    Right Hip Extension  3+/5    Right Hip ABduction  4+/5    Left Hip Flexion  3+/5    Left Hip Extension  3+/5    Left Hip ABduction  4-/5    Right/Left Knee  Right;Left    Right Knee Flexion  4/5    Right Knee Extension  4+/5    Left Knee Flexion  4-/5    Left Knee Extension  5/5      Flexibility   Soft Tissue Assessment /Muscle Length  yes    Hamstrings  marked bil    Quadriceps  marked left, mod right    Piriformis  tight on R    Obturator Internus  tight bil gastroc/soleus      Ambulation/Gait   Ambulation/Gait  Yes    Ambulation/Gait Assistance  7: Independent    Ambulation Distance (Feet)  60 Feet    Assistive device  None    Gait Pattern  Decreased step length - right;Decreased step length - left;Decreased stride length;Decreased dorsiflexion - right;Decreased dorsiflexion - left;Left flexed knee in stance    Ambulation Surface  Level    Stairs  Yes    Stairs Assistance  7: Independent    Stair Management Technique  One rail Left;Alternating pattern    Number of Stairs  4  Gait Comments  eccentric bil quad weakness with descent      Standardized Balance Assessment   Standardized Balance Assessment  Berg Balance Test;Five Times Sit to Stand    Five times sit to stand comments   18 sec      Berg  Balance Test   Sit to Stand  Able to stand without using hands and stabilize independently    Standing Unsupported  Able to stand safely 2 minutes    Sitting with Back Unsupported but Feet Supported on Floor or Stool  Able to sit safely and securely 2 minutes    Stand to Sit  Sits safely with minimal use of hands    Transfers  Able to transfer safely, minor use of hands    Standing Unsupported with Eyes Closed  Able to stand 10 seconds safely    Standing Ubsupported with Feet Together  Able to place feet together independently and stand 1 minute safely    From Standing, Reach Forward with Outstretched Arm  Can reach forward >12 cm safely (5")    From Standing Position, Pick up Object from Floor  Able to pick up shoe safely and easily    From Standing Position, Turn to Look Behind Over each Shoulder  Looks behind from both sides and weight shifts well    Turn 360 Degrees  Able to turn 360 degrees safely in 4 seconds or less    Standing Unsupported, Alternately Place Feet on Step/Stool  Able to stand independently and safely and complete 8 steps in 20 seconds    Standing Unsupported, One Foot in Front  Able to place foot tandem independently and hold 30 seconds    Standing on One Leg  Tries to lift leg/unable to hold 3 seconds but remains standing independently    Total Score  52                Objective measurements completed on examination: See above findings.              PT Education - 02/11/18 1423    Education Details  HEP    Person(s) Educated  Patient    Methods  Explanation;Demonstration;Handout    Comprehension  Verbalized understanding;Returned demonstration       PT Short Term Goals - 02/11/18 1430      PT SHORT TERM GOAL #1   Title  ind with initial HEP    Time  2    Period  Weeks    Status  New    Target Date  02/25/18      PT SHORT TERM GOAL #2   Title  improved 5 times sit <> stand to 12 sec or better    Time  4    Period  Weeks    Status   New    Target Date  03/11/18        PT Long Term Goals - 02/11/18 1432      PT LONG TERM GOAL #1   Title  Pt to demo improved BLE strength to 4-4+/5 or better to improve function.    Time  8    Period  Weeks    Status  New    Target Date  04/08/18      PT LONG TERM GOAL #2   Title  improved left knee extension to -5 deg or better to help normalize gait and decrease pain    Time  8    Period  Weeks  Status  New      PT LONG TERM GOAL #3   Title  able to descend stairs and declines with 50% less pain in the knees.    Time  8    Period  Weeks    Status  New      PT LONG TERM GOAL #4   Title  Pt to demonstrate improved step/stride length with gait to WNL    Time  8    Period  Weeks    Status  New             Plan - 02/11/18 1423    Clinical Impression Statement  Patient presents today for bil leg weakness and deconditioning following surgery and treatment for colon cancer 2 years ago. He denies any falls but has had incidences of legs giving out. His BERG balance score was 52/56, but his sit to stand time is 18 seconds which indicates a fall risk. BLE weakness is his main issue however he has bil knee pain due to OA and stands and walks with his left knee flexed. He has marked flexibility deficits in BLE as well and reports left plantar faciitis which was not assessed today.    History and Personal Factors relevant to plan of care:  colon cancer 2017, HTN, stage1 kidney disease, arthritis, left plantar faciitis    Clinical Presentation  Stable    Clinical Decision Making  Low    Rehab Potential  Excellent    PT Frequency  2x / week    PT Duration  8 weeks    PT Treatment/Interventions  ADLs/Self Care Home Management;Electrical Stimulation;Moist Heat;Cryotherapy;Gait training;Stair training;Therapeutic exercise;Balance training;Neuromuscular re-education;Patient/family education;Passive range of motion;Manual techniques    PT Next Visit Plan  BLE strengthening and  flexibility; eccentric quad strengthening,  work on increasing Left knee extension also    PT Home Exercise Plan  step ups, sit to stand, seated HS stretch    Consulted and Agree with Plan of Care  Patient       Patient will benefit from skilled therapeutic intervention in order to improve the following deficits and impairments:  Abnormal gait, Pain, Decreased strength, Decreased range of motion, Postural dysfunction, Impaired flexibility, Decreased balance  Visit Diagnosis: Muscle weakness (generalized) - Plan: PT plan of care cert/re-cert  Chronic pain of left knee - Plan: PT plan of care cert/re-cert  Chronic pain of right knee - Plan: PT plan of care cert/re-cert     Problem List Patient Active Problem List   Diagnosis Date Noted  . New onset left bundle branch block (LBBB) 01/12/2018  . Atherosclerotic vascular disease 01/12/2018  . 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 Port catheter in place 07/02/2016  . 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 Elevated fasting glucose 04/10/2016  . Right inguinal hernia 04/10/2016  . Adenocarcinoma of colon ([place]) 04/10/2016  . Enterocolitis   . Essential hypertension 04/04/2016  . Acute kidney injury superimposed on chronic kidney disease Stage 2 ([place]) 04/04/2016  . Ileus ([address]) 04/02/2016    Diantha Paxson PT 02/11/2018, 2:37 PM  [facility] [address], [place], [place], [address] Phone: [phone]   Fax:  [phone]  Name: Louis [name] MRN: [id number] Date of Birth: [birth year]

## 2018-02-11 NOTE — Patient Instructions (Signed)
Sit to Stand    Sit on edge of chair, feet flat on floor. Stand upright, extending knees fully. Repeat 10 times per set. Do _1-3 sets per session. Do __1-2__ sessions per day.  Proprioception, Quad Strength, Timing, Coordination: Forward Step-Up    Perform on bottom stair holding onto railing. Move onto bottom step of stairs with one foot, then the other. Step back down with _Right__ foot.  Then repeat on other side. Use ____ inch step. Repeat _10-30___ times or for ____ minutes. Do __1-2__ sessions per day.   Leg Extension (Hamstring)   Sit toward front edge of chair, with leg out straight, heel on floor, toes pointing toward body. Keeping back straight, bend forward at hip, until a stretch is felt. Hold 30-60 seconds. Repeat _3__ times. Repeat with other leg. Do __2-3_ sessions per day.  YOU MAY PLACE YOUR FOOT ON A STOOL FOR A BETTER STRETCH.   Madelyn Flavors, PT 02/11/18 9:28 AM [facility] [address], [place], [place] [address] Phone # [phone] Fax [phone]

## 2018-02-12 DIAGNOSIS — Z23 Encounter for immunization: Secondary | ICD-10-CM | POA: Diagnosis not present

## 2018-02-17 ENCOUNTER — Ambulatory Visit: Payer: Medicare Other

## 2018-02-17 DIAGNOSIS — G8929 Other chronic pain: Secondary | ICD-10-CM | POA: Diagnosis not present

## 2018-02-17 DIAGNOSIS — M25561 Pain in right knee: Secondary | ICD-10-CM

## 2018-02-17 DIAGNOSIS — M6281 Muscle weakness (generalized): Secondary | ICD-10-CM | POA: Diagnosis not present

## 2018-02-17 DIAGNOSIS — M25562 Pain in left knee: Secondary | ICD-10-CM

## 2018-02-17 NOTE — Therapy (Signed)
[facility] [address] [place], [place], [address] Phone: [phone]   Fax:  [phone]  Physical Therapy Treatment  Patient Details  Name: Louis [name] MRN: [id number] Date of Birth: [birth year] Referring Provider (PT): Marella Chimes [facility]   Encounter Date: 02/17/2018  PT End of Session - 02/17/18 0844    Visit Number  2    Date for PT Re-Evaluation  05/06/18    Authorization Type  Medicare    PT Start Time  0759    PT Stop Time  0840    PT Time Calculation (min)  41 min    Activity Tolerance  Patient tolerated treatment well    Behavior During Therapy  [facility] for tasks assessed/performed       Past Medical History:  Diagnosis Date  . Allergy   . Arthritis   . CKD (chronic kidney disease) stage 2, GFR 60-89 ml/min    see Col[place], Stage 1  . colon ca dx'd 04/2016   colon  . Heart murmur    many years   . Hernia, abdominal   . Hyperlipidemia   . Hypertension   . Ileus ([place])   . Small bowel obstruction ([place])   . 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    Past Surgical History:  Procedure Laterality Date  . BIOPSY  04/10/2016   Procedure: BIOPSY Mesentary;  Surgeon: Judeth Horn, MD;  Location: [place];  Service: General;;  . COLOSTOMY    . COLOSTOMY REVERSAL N/A 01/12/2017   Procedure: COLOSTOMY REVERSAL;  Surgeon: Judeth Horn, MD;  Location: [place];  Service: General;  Laterality: N/A;  . FLEXIBLE SIGMOIDOSCOPY N/A 04/09/2016   Procedure: Beryle Quant;  Surgeon: Ladene Artist, MD;  Location: [facility] ENDOSCOPY;  Service: Endoscopy;  Laterality: N/A;  . HERNIA REPAIR     in childhood  . IR GENERIC HISTORICAL  05/14/2016   IR US GUIDE VASC ACCESS RIGHT 05/14/2016 Corrie Mckusick, DO [facility]  . IR GENERIC HISTORICAL  05/14/2016   IR FLUORO GUIDE PORT INSERTION RIGHT 05/14/2016 Corrie Mckusick, DO [facility]  . IR REMOVAL TUN ACCESS W/ PORT W/O FL MOD SED  11/11/2016  . PARTIAL COLECTOMY N/A  04/10/2016   Procedure: SIGMOID  COLECTOMY WITH COLOSTOMY;  Surgeon: Judeth Horn, MD;  Location: [place];  Service: General;  Laterality: N/A;  . TONSILLECTOMY      There were no vitals filed for this visit.  Subjective Assessment - 02/17/18 0801    Subjective  I haven't done the exercises yet.  I go to the [facility] 1-2x/wk.    Currently in Pain?  No/denies                       [facility] Adult PT Treatment/Exercise - 02/17/18 0001      Exercises   Exercises  Ankle;Knee/Hip      Knee/Hip Exercises: Stretches   Active Hamstring Stretch  Both;3 reps;20 seconds      Knee/Hip Exercises: Aerobic   Nustep  Level 3x 8 minutes   PT present to discuss progress     Knee/Hip Exercises: Standing   Heel Raises  Both;2 sets;10 reps    Hip Extension  Stengthening;Both;2 sets;10 reps    Forward Step Up  2 sets;10 reps;Both    Rocker Board  2 minutes      Knee/Hip Exercises: Seated   Long Arc Quad  Strengthening;Both;2 sets;10 reps    Marching  Strengthening;Both;2 sets;10  reps               PT Short Term Goals - 02/11/18 1430      PT SHORT TERM GOAL #1   Title  ind with initial HEP    Time  2    Period  Weeks    Status  New    Target Date  02/25/18      PT SHORT TERM GOAL #2   Title  improved 5 times sit <> stand to 12 sec or better    Time  4    Period  Weeks    Status  New    Target Date  03/11/18        PT Long Term Goals - 02/11/18 1432      PT LONG TERM GOAL #1   Title  Pt to demo improved BLE strength to 4-4+/5 or better to improve function.    Time  8    Period  Weeks    Status  New    Target Date  04/08/18      PT LONG TERM GOAL #2   Title  improved left knee extension to -5 deg or better to help normalize gait and decrease pain    Time  8    Period  Weeks    Status  New      PT LONG TERM GOAL #3   Title  able to descend stairs and declines with 50% less pain in the knees.    Time  8    Period  Weeks    Status  New      PT LONG TERM  GOAL #4   Title  Pt to demonstrate improved step/stride length with gait to WNL    Time  8    Period  Weeks    Status  New            Plan - 02/17/18 0813    Clinical Impression Statement  Pt with first time follow-up after evaluation and pt reports non-compliance with HEP for strength.  Pt demonstrates uncontrolled descend with stand to sit transition.  Pt with bil knee flexion with standing and walking due to shortened hamstring length.  Pt with chronic strength and endurance deficits and will continue to benefit from skilled PT LE strength, endurance and gait.      Rehab Potential  Excellent    PT Frequency  2x / week    PT Duration  8 weeks    PT Treatment/Interventions  ADLs/Self Care Home Management;Electrical Stimulation;Moist Heat;Cryotherapy;Gait training;Stair training;Therapeutic exercise;Balance training;Neuromuscular re-education;Patient/family education;Passive range of motion;Manual techniques    PT Next Visit Plan  hamstring flexibility, strength, endurance, balance    Consulted and Agree with Plan of Care  Patient       Patient will benefit from skilled therapeutic intervention in order to improve the following deficits and impairments:  Abnormal gait, Pain, Decreased strength, Decreased range of motion, Postural dysfunction, Impaired flexibility, Decreased balance  Visit Diagnosis: Muscle weakness (generalized)  Chronic pain of left knee  Chronic pain of right knee     Problem List Patient Active Problem List   Diagnosis Date Noted  . New onset left bundle branch block (LBBB) 01/12/2018  . Atherosclerotic vascular disease 01/12/2018  . 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 Port catheter in place 07/02/2016  . 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 Elevated fasting glucose 04/10/2016  . Right inguinal hernia 04/10/2016  . Adenocarcinoma of colon ([place]) 04/10/2016  .  Enterocolitis   . Essential hypertension 04/04/2016  . Acute kidney injury  superimposed on chronic kidney disease Stage 2 ([place]) 04/04/2016  . Ileus ([place]) 04/02/2016   Sigurd Sos, PT 02/17/18 8:46 AM  [place] [facility] [address] [place], [place], [address] Phone: [phone]   Fax:  [phone]  Name: Louis [name] MRN: [id number] Date of Birth: October 13, 1945

## 2018-02-22 ENCOUNTER — Telehealth: Payer: Self-pay | Admitting: Cardiology

## 2018-02-22 NOTE — Telephone Encounter (Signed)
Patient meant to call the surgery center. He is going to check with them and let us know what is needed.

## 2018-02-22 NOTE — Telephone Encounter (Signed)
Patient wants to know if he is cleared for surgery.

## 2018-02-24 ENCOUNTER — Ambulatory Visit: Payer: Medicare Other

## 2018-02-24 ENCOUNTER — Telehealth: Payer: Self-pay

## 2018-02-24 NOTE — Telephone Encounter (Signed)
PT called pt due to missed appointment.  He lost track of time and didn't realize he missed his appointment.  He asked to cancel appt for 84/85/92 due to conflict.  He confirmed appt for 03/03/18.

## 2018-02-25 ENCOUNTER — Telehealth: Payer: Self-pay | Admitting: Cardiology

## 2018-02-25 ENCOUNTER — Encounter: Payer: Self-pay | Admitting: Emergency Medicine

## 2018-02-25 ENCOUNTER — Telehealth: Payer: Self-pay | Admitting: *Deleted

## 2018-02-25 NOTE — Telephone Encounter (Signed)
Dr. Bettina Gavia reviewed patient's chart to determine if he is cleared for surgery from a cardiac standpoint. Dr. Bettina Gavia advised that patient needs to be seen by Dr. Geraldo Pitter in the office to discuss test results before clearance is given. Patient informed of this and refused to make an appointment.  Will have Dr. Geraldo Pitter address cardiac clearance upon his return to advise.

## 2018-02-25 NOTE — Telephone Encounter (Signed)
Patient called to discuss surgery clearance. He was very agitated that the surgeon had not already received clearance.  He would like a call back on Monday to discuss.  He can be reached at [phone] (M)

## 2018-02-25 NOTE — Telephone Encounter (Signed)
Please refer to most recent telephone message sent to provider's nurse

## 2018-02-25 NOTE — Telephone Encounter (Signed)
Attempted to contact patient with no answer on cell phone. Left message to return call on home phone regarding surgery clearance. Patient needs to follow up with Dr. Geraldo Pitter before clearance is given for surgery. Will schedule appointment when patient returns call.

## 2018-02-25 NOTE — Telephone Encounter (Signed)
Dr. Agustin Cree reviewed this patient's chart and test results and has cleared him for his upcoming surgery. Patient informed that another doctor reviewed this and cleared him for surgery. Patient verbally understands. Clearance letter will be faxed to [facility]. No further questions.

## 2018-02-25 NOTE — Telephone Encounter (Signed)
Please discuss with patient.

## 2018-02-25 NOTE — Telephone Encounter (Signed)
Clearance has been faxed successfully.

## 2018-02-26 ENCOUNTER — Ambulatory Visit: Payer: Medicare Other | Admitting: Physical Therapy

## 2018-03-03 ENCOUNTER — Ambulatory Visit: Payer: Medicare Other

## 2018-03-03 DIAGNOSIS — M25561 Pain in right knee: Secondary | ICD-10-CM

## 2018-03-03 DIAGNOSIS — M25562 Pain in left knee: Secondary | ICD-10-CM

## 2018-03-03 DIAGNOSIS — M6281 Muscle weakness (generalized): Secondary | ICD-10-CM

## 2018-03-03 DIAGNOSIS — G8929 Other chronic pain: Secondary | ICD-10-CM | POA: Diagnosis not present

## 2018-03-03 NOTE — Therapy (Signed)
[facility] [address], [place], [place], [address] Phone: [phone]   Fax:  [phone]  Physical Therapy Treatment  Patient Details  Name: Louis [name] MRN: [id number] Date of Birth: December 08, 1945 Referring Provider (PT): Marella Chimes [facility]   Encounter Date: 03/03/2018  PT End of Session - 03/03/18 0924    Visit Number  3    Date for PT Re-Evaluation  05/06/18    Authorization Type  Medicare    PT Start Time  0847    PT Stop Time  0925    PT Time Calculation (min)  38 min    Activity Tolerance  Patient tolerated treatment well    Behavior During Therapy  [facility] for tasks assessed/performed       Past Medical History:  Diagnosis Date  . Allergy   . Arthritis   . CKD (chronic kidney disease) stage 2, GFR 60-89 ml/min    see [place], Stage 1  . colon ca dx'd 04/2016   colon  . Heart murmur    many years   . Hernia, abdominal   . Hyperlipidemia   . Hypertension   . Ileus ([place])   . Small bowel obstruction ([place])   . 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    Past Surgical History:  Procedure Laterality Date  . BIOPSY  04/10/2016   Procedure: BIOPSY Mesentary;  Surgeon: Judeth Horn, MD;  Location: [place];  Service: General;;  . COLOSTOMY    . COLOSTOMY REVERSAL N/A 01/12/2017   Procedure: COLOSTOMY REVERSAL;  Surgeon: Judeth Horn, MD;  Location: [place];  Service: General;  Laterality: N/A;  . FLEXIBLE SIGMOIDOSCOPY N/A 04/09/2016   Procedure: Beryle Quant;  Surgeon: Ladene Artist, MD;  Location: [facility] ENDOSCOPY;  Service: Endoscopy;  Laterality: N/A;  . HERNIA REPAIR     in childhood  . IR GENERIC HISTORICAL  05/14/2016   IR US GUIDE VASC ACCESS RIGHT 05/14/2016 Corrie Mckusick, DO [facility]  . IR GENERIC HISTORICAL  05/14/2016   IR FLUORO GUIDE PORT INSERTION RIGHT 05/14/2016 Corrie Mckusick, DO [facility]  . IR REMOVAL TUN ACCESS W/ PORT W/O FL MOD SED  11/11/2016  . PARTIAL COLECTOMY N/A  04/10/2016   Procedure: SIGMOID  COLECTOMY WITH COLOSTOMY;  Surgeon: Judeth Horn, MD;  Location: [place];  Service: General;  Laterality: N/A;  . TONSILLECTOMY      There were no vitals filed for this visit.  Subjective Assessment - 03/03/18 0849    Subjective  Not doing too bad.  I have done some of the exercises.      Currently in Pain?  No/denies         [facility] PT Assessment - 03/03/18 0001      Standardized Balance Assessment   Five times sit to stand comments   13 seconds                   [facility] Adult PT Treatment/Exercise - 03/03/18 0001      Exercises   Exercises  Ankle;Knee/Hip      Knee/Hip Exercises: Stretches   Active Hamstring Stretch  Both;3 reps;20 seconds      Knee/Hip Exercises: Aerobic   Nustep  Level 3x 8 minutes   PT present to discuss progress     Knee/Hip Exercises: Standing   Heel Raises  Both;2 sets;10 reps    Hip Abduction  Stengthening;Both;2 sets;10 reps    Hip Extension  Stengthening;Both;2 sets;10 reps  Rocker Board  2 minutes      Knee/Hip Exercises: Seated   Long Arc Quad  Strengthening;Both;2 sets;10 reps    Marching  Strengthening;Both;2 sets;10 reps             PT Education - 03/03/18 [phone]    Education Details   Access Code: [email or web address]     Person(s) Educated  Patient    Methods  Explanation;Demonstration;Handout    Comprehension  Verbalized understanding;Returned demonstration       PT Short Term Goals - 03/03/18 0851      PT SHORT TERM GOAL #1   Title  ind with initial HEP    Status  Achieved      PT SHORT TERM GOAL #2   Title  improved 5 times sit <> stand to 12 sec or better    Baseline  13 seconds    Time  4    Period  Weeks    Status  On-going        PT Long Term Goals - 02/11/18 1432      PT LONG TERM GOAL #1   Title  Pt to demo improved BLE strength to 4-4+/5 or better to improve function.    Time  8    Period  Weeks    Status  New    Target Date  04/08/18      PT LONG TERM GOAL #2    Title  improved left knee extension to -5 deg or better to help normalize gait and decrease pain    Time  8    Period  Weeks    Status  New      PT LONG TERM GOAL #3   Title  able to descend stairs and declines with 50% less pain in the knees.    Time  8    Period  Weeks    Status  New      PT LONG TERM GOAL #4   Title  Pt to demonstrate improved step/stride length with gait to WNL    Time  8    Period  Weeks    Status  New            Plan - 03/03/18 0904    Clinical Impression Statement  Pt with limited compliance with HEP for strength and flexibility.  PT added to HEP and emphasized importance of compliance for gains. Pt demonstrates reduced quad strength with long arc quad and sit to stand and reduced hamstring length.  Pt requires minor verbal cues for technique with standing exercise today.  Pt with chronic weakness associated with illness and will continue to benefit from skilled PT for endurance, strength and flexibility.      Rehab Potential  Excellent    PT Frequency  2x / week    PT Duration  8 weeks    PT Treatment/Interventions  ADLs/Self Care Home Management;Electrical Stimulation;Moist Heat;Cryotherapy;Gait training;Stair training;Therapeutic exercise;Balance training;Neuromuscular re-education;Patient/family education;Passive range of motion;Manual techniques    PT Next Visit Plan  hamstring flexibility, strength, endurance, balance    PT Home Exercise Plan  step ups, sit to stand, seated HS stretch, Access Code: [email or web address]    Recommended Other Services  initial certification is signed    Consulted and Agree with Plan of Care  Patient       Patient will benefit from skilled therapeutic intervention in order to improve the following deficits and impairments:  Abnormal gait, Pain, Decreased strength, Decreased range of motion, Postural dysfunction, Impaired  flexibility, Decreased balance  Visit Diagnosis: Muscle weakness (generalized)  Chronic pain of left  knee  Chronic pain of right knee     Problem List Patient Active Problem List   Diagnosis Date Noted  . New onset left bundle branch block (LBBB) 01/12/2018  . Atherosclerotic vascular disease 01/12/2018  . Colostomy in place [facility]) 01/12/2017  . Port catheter in place 07/02/2016  . 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 Elevated fasting glucose 04/10/2016  . Right inguinal hernia 04/10/2016  . Adenocarcinoma of colon ([place]) 04/10/2016  . Enterocolitis   . Essential hypertension 04/04/2016  . Acute kidney injury superimposed on chronic kidney disease Stage 2 ([place]) 04/04/2016  . Ileus ([place]) 04/02/2016    Sigurd Sos, PT 03/03/18 9:28 AM  [place] [address] [place], [place], [address] Phone: [phone]   Fax:  [phone]  Name: Louis [name] MRN: [id number] Date of Birth: 1945-07-03

## 2018-03-03 NOTE — Patient Instructions (Signed)
Access Code: [email or web address]  URL: [email or web address]  Date: 03/03/2018  Prepared by: Sigurd Sos   Exercises  Seated Long Arc Quad - 10 reps - 2 sets - 5 hold - 3x daily - 7x weekly  Seated March - 10 reps - 3 sets - 3x daily - 7x weekly  Standing Hip Abduction - 10 reps - 2 sets - 2x daily - 7x weekly  Standing Hip Extension - 10 reps - 2 sets - 2x daily - 7x weekly  Standing Heel Raises - 10 reps - 3 sets - 1x daily - 7x weekly

## 2018-03-05 ENCOUNTER — Ambulatory Visit: Payer: Medicare Other | Attending: Physician Assistant | Admitting: Physical Therapy

## 2018-03-05 ENCOUNTER — Encounter: Payer: Self-pay | Admitting: Physical Therapy

## 2018-03-05 DIAGNOSIS — M25561 Pain in right knee: Secondary | ICD-10-CM | POA: Insufficient documentation

## 2018-03-05 DIAGNOSIS — M25562 Pain in left knee: Secondary | ICD-10-CM | POA: Insufficient documentation

## 2018-03-05 DIAGNOSIS — M6281 Muscle weakness (generalized): Secondary | ICD-10-CM | POA: Insufficient documentation

## 2018-03-05 DIAGNOSIS — G8929 Other chronic pain: Secondary | ICD-10-CM | POA: Diagnosis not present

## 2018-03-05 NOTE — Therapy (Signed)
[facility] [address] [place], [place], [address] Phone: [phone]   Fax:  [phone]  Physical Therapy Treatment  Patient Details  Name: Louis [name] MRN: [id number] Date of Birth: 29-Apr-1946 Referring Provider (PT): Marella Chimes [facility]   Encounter Date: 03/05/2018  PT End of Session - 03/05/18 1233    Visit Number  4    Date for PT Re-Evaluation  05/06/18    Authorization Type  Medicare    PT Start Time  0935    PT Stop Time  1015    PT Time Calculation (min)  40 min    Activity Tolerance  Patient tolerated treatment well       Past Medical History:  Diagnosis Date  . Allergy   . Arthritis   . CKD (chronic kidney disease) stage 2, GFR 60-89 ml/min    see [place], Stage 1  . colon ca dx'd 04/2016   colon  . Heart murmur    many years   . Hernia, abdominal   . Hyperlipidemia   . Hypertension   . Ileus ([place])   . Small bowel obstruction ([place])   . 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    Past Surgical History:  Procedure Laterality Date  . BIOPSY  04/10/2016   Procedure: BIOPSY Mesentary;  Surgeon: Judeth Horn, MD;  Location: [place];  Service: General;;  . COLOSTOMY    . COLOSTOMY REVERSAL N/A 01/12/2017   Procedure: COLOSTOMY REVERSAL;  Surgeon: Judeth Horn, MD;  Location: [place];  Service: General;  Laterality: N/A;  . FLEXIBLE SIGMOIDOSCOPY N/A 04/09/2016   Procedure: Beryle Quant;  Surgeon: Ladene Artist, MD;  Location: [facility] ENDOSCOPY;  Service: Endoscopy;  Laterality: N/A;  . HERNIA REPAIR     in childhood  . IR GENERIC HISTORICAL  05/14/2016   IR US GUIDE VASC ACCESS RIGHT 05/14/2016 Corrie Mckusick, DO [facility]  . IR GENERIC HISTORICAL  05/14/2016   IR FLUORO GUIDE PORT INSERTION RIGHT 05/14/2016 Corrie Mckusick, DO [facility]  . IR REMOVAL TUN ACCESS W/ PORT W/O FL MOD SED  11/11/2016  . PARTIAL COLECTOMY N/A 04/10/2016   Procedure: SIGMOID  COLECTOMY WITH COLOSTOMY;  Surgeon:  Judeth Horn, MD;  Location: [place];  Service: General;  Laterality: N/A;  . TONSILLECTOMY      There were no vitals filed for this visit.  Subjective Assessment - 03/05/18 0938    Subjective  General soreness from exercises.  Reports some arthritic discomfort in the mornings.  Left knee > right knee.      Pertinent History  colon cancer 2017, HTN, stage1 kidney disease, arthritis, left plantar facitis    Currently in Pain?  Yes    Pain Score  2     Pain Location  Knee                       [facility] Adult PT Treatment/Exercise - 03/05/18 0001      Knee/Hip Exercises: Stretches   Active Hamstring Stretch  Both;3 reps;20 seconds    Active Hamstring Stretch Limitations  seated    Other Knee/Hip Stretches  2nd step hip flexor stretch with UE elevation 5x right/left       Knee/Hip Exercises: Aerobic   Nustep  Level 4x 6 minutes LEs only    PT present to discuss progress     Knee/Hip Exercises: Standing   Heel Raises  Both;2 sets;10 reps    Hip  Abduction  Stengthening;Right;Left;15 reps    Abduction Limitations  red band     Hip Extension  Stengthening;Right;Left;15 reps    Extension Limitations  red band     Forward Step Up  Right;Left;10 reps;Step Height: 6"      Knee/Hip Exercises: Seated   Long Arc Quad  Strengthening;Right;Left;15 reps    Long Arc Quad Limitations  red band     Marching  Strengthening;Right;Left;15 reps    Marching Limitations  red band              PT Education - 03/05/18 1231    Education Details   Access Code: [email or web address] seated knee extension and hip flexion with red band;  single leg press with green band    Person(s) Educated  Patient    Methods  Demonstration;Handout;Explanation    Comprehension  Verbalized understanding;Returned demonstration       PT Short Term Goals - 03/03/18 0851      PT SHORT TERM GOAL #1   Title  ind with initial HEP    Status  Achieved      PT SHORT TERM GOAL #2   Title  improved 5 times sit <> stand  to 12 sec or better    Baseline  13 seconds    Time  4    Period  Weeks    Status  On-going        PT Long Term Goals - 02/11/18 1432      PT LONG TERM GOAL #1   Title  Pt to demo improved BLE strength to 4-4+/5 or better to improve function.    Time  8    Period  Weeks    Status  New    Target Date  04/08/18      PT LONG TERM GOAL #2   Title  improved left knee extension to -5 deg or better to help normalize gait and decrease pain    Time  8    Period  Weeks    Status  New      PT LONG TERM GOAL #3   Title  able to descend stairs and declines with 50% less pain in the knees.    Time  8    Period  Weeks    Status  New      PT LONG TERM GOAL #4   Title  Pt to demonstrate improved step/stride length with gait to WNL    Time  8    Period  Weeks    Status  New            Plan - 03/05/18 0955    Clinical Impression Statement  Patient reports left knee pain is > right knee pain and limits him with prolonged weight bearing.  He is unable to stand more than 10 minutes.  Although he reports he is typically sore following PT, he requests to increase intensity on Nu-Step and does more repetitons than asked on some ex's.   Added seated LE strengthening with resistive bands with patient reporting muscular fatigue in quad muscles.  Therapist closely monitoring for pain and modifying as needed.    Rehab Potential  Excellent    PT Frequency  2x / week    PT Duration  8 weeks    PT Treatment/Interventions  ADLs/Self Care Home Management;Electrical Stimulation;Moist Heat;Cryotherapy;Gait training;Stair training;Therapeutic exercise;Balance training;Neuromuscular re-education;Patient/family education;Passive range of motion;Manual techniques    PT Next Visit Plan  recheck 5x sit to stand test for  STG;  hamstring flexibility, strength, endurance, balance    PT Home Exercise Plan  step ups, sit to stand, seated HS stretch, Access Code: [email or web address]       Patient will benefit from skilled  therapeutic intervention in order to improve the following deficits and impairments:  Abnormal gait, Pain, Decreased strength, Decreased range of motion, Postural dysfunction, Impaired flexibility, Decreased balance  Visit Diagnosis: Muscle weakness (generalized)  Chronic pain of left knee  Chronic pain of right knee     Problem List Patient Active Problem List   Diagnosis Date Noted  . New onset left bundle branch block (LBBB) 01/12/2018  . Atherosclerotic vascular disease 01/12/2018  . 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 Port catheter in place 07/02/2016  . Aorto-iliac atherosclerosis ([place]) 04/10/2016  . Elevated fasting glucose 04/10/2016  . Right inguinal hernia 04/10/2016  . Adenocarcinoma of colon ([place]) 04/10/2016  . Enterocolitis   . Essential hypertension 04/04/2016  . Acute kidney injury superimposed on chronic kidney disease Stage 2 ([place]) 04/04/2016  . Ileus ([place]) 04/02/2016   Ruben Im, PT 03/05/18 12:39 PM Phone: [phone] Fax: [phone]  Alvera Singh 03/05/2018, 12:38 PM  [place] [facility] [address], [place], [place], [address] Phone: [phone]   Fax:  [phone]  Name: Louis [name] MRN: [id number] Date of Birth: 1945-05-26

## 2018-03-05 NOTE — Patient Instructions (Signed)
Access Code: [email or web address]  URL: [email or web address]  Date: 03/05/2018  Prepared by: Ruben Im   Exercises  Seated Long Arc Quad - 10 reps - 2 sets - 5 hold - 3x daily - 7x weekly  Seated March - 10 reps - 3 sets - 3x daily - 7x weekly  Standing Hip Abduction - 10 reps - 2 sets - 2x daily - 7x weekly  Standing Hip Extension - 10 reps - 2 sets - 2x daily - 7x weekly  Standing Heel Raises - 10 reps - 3 sets - 1x daily - 7x weekly  Seated Knee Extension with Resistance - 10 reps - 1 sets - 1x daily - 7x weekly  Seated Leg Press with Resistance - 10 reps - 1 sets - 1x daily - 7x weekly  Seated March with Resistance - 10 reps - 1 sets - 1x daily - 7x weekly

## 2018-03-11 ENCOUNTER — Encounter: Payer: Self-pay | Admitting: Physical Therapy

## 2018-03-11 ENCOUNTER — Ambulatory Visit: Payer: Medicare Other | Admitting: Physical Therapy

## 2018-03-11 DIAGNOSIS — M6281 Muscle weakness (generalized): Secondary | ICD-10-CM | POA: Diagnosis not present

## 2018-03-11 DIAGNOSIS — M25561 Pain in right knee: Secondary | ICD-10-CM

## 2018-03-11 DIAGNOSIS — G8929 Other chronic pain: Secondary | ICD-10-CM

## 2018-03-11 DIAGNOSIS — M25562 Pain in left knee: Secondary | ICD-10-CM

## 2018-03-11 NOTE — Therapy (Signed)
[facility] [address] [place], [place], [address] Phone: [phone]   Fax:  [phone]  Physical Therapy Treatment  Patient Details  Name: Louis [name] MRN: [id number] Date of Birth: Aug 16, 1945 Referring Provider (PT): Marella Chimes [facility]   Encounter Date: 03/11/2018  PT End of Session - 03/11/18 0844    Visit Number  5    Date for PT Re-Evaluation  05/06/18    Authorization Type  Medicare    PT Start Time  0833    PT Stop Time  0920    PT Time Calculation (min)  47 min    Activity Tolerance  Patient tolerated treatment well       Past Medical History:  Diagnosis Date  . Allergy   . Arthritis   . CKD (chronic kidney disease) stage 2, GFR 60-89 ml/min    see [place], Stage 1  . colon ca dx'd 04/2016   colon  . Heart murmur    many years   . Hernia, abdominal   . Hyperlipidemia   . Hypertension   . Ileus ([place])   . Small bowel obstruction ([place])   . 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    Past Surgical History:  Procedure Laterality Date  . BIOPSY  04/10/2016   Procedure: BIOPSY Mesentary;  Surgeon: Judeth Horn, MD;  Location: [place];  Service: General;;  . COLOSTOMY    . COLOSTOMY REVERSAL N/A 01/12/2017   Procedure: COLOSTOMY REVERSAL;  Surgeon: Judeth Horn, MD;  Location: [place];  Service: General;  Laterality: N/A;  . FLEXIBLE SIGMOIDOSCOPY N/A 04/09/2016   Procedure: Beryle Quant;  Surgeon: Ladene Artist, MD;  Location: [facility] ENDOSCOPY;  Service: Endoscopy;  Laterality: N/A;  . HERNIA REPAIR     in childhood  . IR GENERIC HISTORICAL  05/14/2016   IR US GUIDE VASC ACCESS RIGHT 05/14/2016 Corrie Mckusick, DO [facility]  . IR GENERIC HISTORICAL  05/14/2016   IR FLUORO GUIDE PORT INSERTION RIGHT 05/14/2016 Corrie Mckusick, DO [facility]  . IR REMOVAL TUN ACCESS W/ PORT W/O FL MOD SED  11/11/2016  . PARTIAL COLECTOMY N/A 04/10/2016   Procedure: SIGMOID  COLECTOMY WITH COLOSTOMY;  Surgeon:  Judeth Horn, MD;  Location: [place];  Service: General;  Laterality: N/A;  . TONSILLECTOMY      There were no vitals filed for this visit.  Subjective Assessment - 03/11/18 0836    Subjective  Just finished a 3 day meeting with a lot of standing.  I'm just stiff.  I haven't done the exercises yet since he was out of town.  Right knee bothering a little more today.      Pertinent History  colon cancer 2017, HTN, stage1 kidney disease, arthritis, left plantar facitis    Currently in Pain?  No/denies    Pain Score  0-No pain    Pain Location  Knee    Pain Orientation  Right;Left    Pain Type  Chronic pain    Aggravating Factors   standing                     Calf stretch on slant board 2x 30 sec   [facility] Adult PT Treatment/Exercise - 03/11/18 0001      Knee/Hip Exercises: Stretches   Active Hamstring Stretch  Right;Left;5 reps    Other Knee/Hip Stretches  2nd step hip flexor stretch with UE elevation 5x right/left       Knee/Hip  Exercises: Aerobic   Nustep  Level 3 10  minutes LEs only    PT present to discuss progress     Knee/Hip Exercises: Machines for Strengthening   Total Gym Leg Press  70# 15x bil;  35# single leg 15x each seat 9       Knee/Hip Exercises: Standing   Heel Raises  --    Hip Abduction  Stengthening;Right;Left;15 reps    Abduction Limitations  red band     Hip Extension  Stengthening;Right;Left;15 reps    Extension Limitations  red band       Knee/Hip Exercises: Seated   Long Arc Quad  Strengthening;Right;Left;15 reps    Long Arc Quad Limitations  red band     Heel Slides  Right;Left;15 reps    Heel Slides Limitations  red band     Marching  Strengthening;Right;Left;15 reps    Marching Limitations  red band                PT Short Term Goals - 03/11/18 0935      PT SHORT TERM GOAL #1   Title  ind with initial HEP    Status  Achieved      PT SHORT TERM GOAL #2   Title  improved 5 times sit <> stand to 12 sec or better    Time   4    Period  Weeks    Status  On-going        PT Long Term Goals - 03/11/18 0935      PT LONG TERM GOAL #1   Title  Pt to demo improved BLE strength to 4-4+/5 or better to improve function.    Time  8    Period  Weeks    Status  On-going      PT LONG TERM GOAL #2   Title  improved left knee extension to -5 deg or better to help normalize gait and decrease pain    Time  8    Period  Weeks    Status  On-going      PT LONG TERM GOAL #3   Title  able to descend stairs and declines with 50% less pain in the knees.    Time  8    Period  Weeks    Status  On-going      PT LONG TERM GOAL #4   Title  Pt to demonstrate improved step/stride length with gait to WNL    Status  Achieved            Plan - 03/11/18 0931    Clinical Impression Statement  The patient reports he is able to  get up and down from the chair and partially squat much easier stating, "I couldn't do that a couple of months ago."  He reports muscular fatigue but no pain during exercises.  Knee stiffness upon rising for first few steps.  He should meet remaining goals in the next 2 weeks.  Will continue discussion of appropriate HEP and gym program.      Rehab Potential  Excellent    PT Frequency  2x / week    PT Duration  8 weeks    PT Treatment/Interventions  ADLs/Self Care Home Management;Electrical Stimulation;Moist Heat;Cryotherapy;Gait training;Stair training;Therapeutic exercise;Balance training;Neuromuscular re-education;Patient/family education;Passive range of motion;Manual techniques    PT Next Visit Plan  recheck 5x sit to stand test for STG; recheck knee extension ROM;   hamstring flexibility, strength, endurance, balance    PT  Home Exercise Plan  step ups, sit to stand, seated HS stretch, Access Code: [email or web address]       Patient will benefit from skilled therapeutic intervention in order to improve the following deficits and impairments:  Abnormal gait, Pain, Decreased strength, Decreased range of  motion, Postural dysfunction, Impaired flexibility, Decreased balance  Visit Diagnosis: Muscle weakness (generalized)  Chronic pain of left knee  Chronic pain of right knee     Problem List Patient Active Problem List   Diagnosis Date Noted  . New onset left bundle branch block (LBBB) 01/12/2018  . Atherosclerotic vascular disease 01/12/2018  . 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 Port catheter in place 07/02/2016  . 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 Elevated fasting glucose 04/10/2016  . Right inguinal hernia 04/10/2016  . Adenocarcinoma of colon ([place]) 04/10/2016  . Enterocolitis   . Essential hypertension 04/04/2016  . Acute kidney injury superimposed on chronic kidney disease Stage 2 ([place]) 04/04/2016  . Ileus ([place]) 04/02/2016   Ruben Im, PT 03/11/18 9:37 AM Phone: [phone] Fax: [phone]  Alvera Singh 03/11/2018, 9:36 AM  [facility] [address], [place], [place], [address] Phone: [phone]   Fax:  [phone]  Name: Louis [name] MRN: [id number] Date of Birth: 09-Sep-1945

## 2018-03-16 ENCOUNTER — Encounter: Payer: Medicare Other | Admitting: Physical Therapy

## 2018-03-18 ENCOUNTER — Ambulatory Visit: Payer: Medicare Other

## 2018-03-18 DIAGNOSIS — M6281 Muscle weakness (generalized): Secondary | ICD-10-CM

## 2018-03-18 DIAGNOSIS — M25561 Pain in right knee: Secondary | ICD-10-CM | POA: Diagnosis not present

## 2018-03-18 DIAGNOSIS — G8929 Other chronic pain: Secondary | ICD-10-CM

## 2018-03-18 DIAGNOSIS — M25562 Pain in left knee: Secondary | ICD-10-CM

## 2018-03-18 NOTE — Therapy (Signed)
[facility] [address] [place], [place], [address] Phone: [phone]   Fax:  [phone]  Physical Therapy Treatment  Patient Details  Name: Louis [name] MRN: [id number] Date of Birth: Jun 08, 1945 Referring Provider (PT): Marella Chimes [facility]   Encounter Date: 03/18/2018  PT End of Session - 03/18/18 0919    Visit Number  6    Date for PT Re-Evaluation  05/06/18    Authorization Type  Medicare    PT Start Time  0842    PT Stop Time  0924    PT Time Calculation (min)  42 min    Activity Tolerance  Patient tolerated treatment well    Behavior During Therapy  [facility] for tasks assessed/performed       Past Medical History:  Diagnosis Date  . Allergy   . Arthritis   . CKD (chronic kidney disease) stage 2, GFR 60-89 ml/min    see [place], Stage 1  . colon ca dx'd 04/2016   colon  . Heart murmur    many years   . Hernia, abdominal   . Hyperlipidemia   . Hypertension   . Ileus ([place])   . Small bowel obstruction ([place])   . 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    Past Surgical History:  Procedure Laterality Date  . BIOPSY  04/10/2016   Procedure: BIOPSY Mesentary;  Surgeon: Judeth Horn, MD;  Location: [place];  Service: General;;  . COLOSTOMY    . COLOSTOMY REVERSAL N/A 01/12/2017   Procedure: COLOSTOMY REVERSAL;  Surgeon: Judeth Horn, MD;  Location: [place];  Service: General;  Laterality: N/A;  . FLEXIBLE SIGMOIDOSCOPY N/A 04/09/2016   Procedure: Beryle Quant;  Surgeon: Ladene Artist, MD;  Location: [facility] END[facility]COPY;  Service: Endoscopy;  Laterality: N/A;  . HERNIA REPAIR     in childhood  . IR GENERIC HISTORICAL  05/14/2016   IR US GUIDE VASC ACCESS RIGHT 05/14/2016 Corrie Mckusick, DO [facility]  . IR GENERIC HISTORICAL  05/14/2016   IR FLUORO GUIDE PORT INSERTION RIGHT 05/14/2016 Corrie Mckusick, DO [facility]  . IR REMOVAL TUN ACCESS W/ PORT W/O FL MOD SED  11/11/2016  . PARTIAL COLECTOMY N/A  04/10/2016   Procedure: SIGMOID  COLECTOMY WITH COLOSTOMY;  Surgeon: Judeth Horn, MD;  Location: [place];  Service: General;  Laterality: N/A;  . TONSILLECTOMY      There were no vitals filed for this visit.  Subjective Assessment - 03/18/18 0853    Subjective  I am able to walk up steps with increased ease    Pertinent History  colon cancer 2017, HTN, stage1 kidney disease, arthritis, left plantar facitis    Currently in Pain?  No/denies         [facility] PT Assessment - 03/18/18 0001      Standardized Balance Assessment   Five times sit to stand comments   12.3 seconds   pad in chair                  [facility] Adult PT Treatment/Exercise - 03/18/18 0001      Exercises   Exercises  Ankle;Knee/Hip      Knee/Hip Exercises: Stretches   Active Hamstring Stretch  Right;Left;5 reps      Knee/Hip Exercises: Aerobic   Nustep  Level 3 10  minutes LEs only    PT present to discuss progress     Knee/Hip Exercises: Field seismologist for Strengthening   Total Gym Leg Press  70# 15x bil;  35# single leg 15x each seat 9       Knee/Hip Exercises: Standing   Heel Raises  Both;2 sets;10 reps    Hip Flexion  Stengthening;Both;2 sets;10 reps    Hip Flexion Limitations  3#    Hip Abduction  Stengthening;Right;Left;15 reps    Abduction Limitations  3#    Hip Extension  Stengthening;Both;2 sets;10 reps    Extension Limitations  3#      Knee/Hip Exercises: Seated   Long Arc Quad  Strengthening;Right;Left;20 reps;Weights    Long Arc Quad Weight  3 lbs.    Sit to Sand  20 reps;without UE support               PT Short Term Goals - 03/18/18 0903      PT SHORT TERM GOAL #2   Title  improved 5 times sit <> stand to 12 sec or better    Baseline  12.3 with black pad in chair    Time  4    Period  Weeks    Status  On-going        PT Long Term Goals - 03/18/18 1751      PT LONG TERM GOAL #3   Title  able to descend stairs and declines with 50% less pain in the knees.    Baseline   20% less with steps and improved stability    Time  8    Period  Weeks    Status  On-going            Plan - 03/18/18 0909    Clinical Impression Statement  Pt is making steady progress in endurance, strength and function.  Pt reports 20% less knee pain with negotiating steps and no change in pain with inclines.  Pt with improved ability to perform sit to stand due to improved strength.  5x sit to stand is 12.3 seconds from black pain in chair.  PT provided verbal cues for posture with standing exercise today. Pt will have hernia repair and will plan to discharge after next week.  Pt will continue to benefit from strength progression to improve mobility and safety.      Rehab Potential  Excellent    PT Frequency  2x / week    PT Duration  8 weeks    PT Treatment/Interventions  ADLs/Self Care Home Management;Electrical Stimulation;Moist Heat;Cryotherapy;Gait training;Stair training;Therapeutic exercise;Balance training;Neuromuscular re-education;Patient/family education;Passive range of motion;Manual techniques    PT Next Visit Plan  recheck knee extension ROM;   hamstring flexibility, strength, endurance, balance. 1 more week probable    PT Home Exercise Plan  Access Code: [email or web address]    Consulted and Agree with Plan of Care  Patient       Patient will benefit from skilled therapeutic intervention in order to improve the following deficits and impairments:  Abnormal gait, Pain, Decreased strength, Decreased range of motion, Postural dysfunction, Impaired flexibility, Decreased balance  Visit Diagnosis: Muscle weakness (generalized)  Chronic pain of left knee  Chronic pain of right knee     Problem List Patient Active Problem List   Diagnosis Date Noted  . New onset left bundle branch block (LBBB) 01/12/2018  . Atherosclerotic vascular disease 01/12/2018  . Colostomy in place [facility]) 01/12/2017  . Port catheter in place 07/02/2016  . 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 Elevated fasting glucose 04/10/2016  . Right inguinal hernia 04/10/2016  . Adenocarcinoma of colon ([place]) 04/10/2016  . Enterocolitis   .  Essential hypertension 04/04/2016  . Acute kidney injury superimposed on chronic kidney disease Stage 2 ([place]) 04/04/2016  . Ileus ([place]) 04/02/2016   Sigurd Sos, PT 03/18/18 9:28 AM  [facility] [address], [place], [place], [address] Phone: [phone]   Fax:  [phone]  Name: Louis [name] MRN: [id number] Date of Birth: 1945/05/11

## 2018-03-19 NOTE — Pre-Procedure Instructions (Signed)
NAZIER NEYHART  03/19/2018      [id number] [place], [address] AT [place] [address] [place] [address] Phone: [phone] Fax: [phone]    Your procedure is scheduled on Monday November 25.  Report to [facility] Admitting at 5:30 A.M.  Call this number if you have problems the morning of surgery:  [phone]   Remember:  Do not eat or drink after midnight.    Take these medicines the morning of surgery with A SIP OF WATER:   Amlodipine (norvasc) Labetalol (Normodyne) Loratadine (Claratin)  7 days prior to surgery STOP taking any Aspirin(unless otherwise instructed by your surgeon), Aleve, Naproxen, Ibuprofen, Motrin, Advil, Goody's, BC's, all herbal medications, fish oil, and all vitamins     Do not wear jewelry  Do not wear lotions, powders, or colognes, or deodorant.  Do not shave 48 hours prior to surgery.  Men may shave face and neck.  Do not bring valuables to the hospital.  [facility] is not responsible for any belongings or valuables.  Contacts, dentures or bridgework may not be worn into surgery.  Leave your suitcase in the car.  After surgery it may be brought to your room.  For patients admitted to the hospital, discharge time will be determined by your treatment team.  Patients discharged the day of surgery will not be allowed to drive home.    Special instructions:    [place]- Preparing For Surgery  Before surgery, you can play an important role. Because skin is not sterile, your skin needs to be as free of germs as possible. You can reduce the number of germs on your skin by washing with CHG (chlorahexidine gluconate) Soap before surgery.  CHG is an antiseptic cleaner which kills germs and bonds with the skin to continue killing germs even after washing.    Oral Hygiene is also important to reduce your risk of infection.  Remember - BRUSH YOUR TEETH THE  MORNING OF SURGERY WITH YOUR REGULAR TOOTHPASTE  Please do not use if you have an allergy to CHG or antibacterial soaps. If your skin becomes reddened/irritated stop using the CHG.  Do not shave (including legs and underarms) for at least 48 hours prior to first CHG shower. It is OK to shave your face.  Please follow these instructions carefully.   1. Shower the NIGHT BEFORE SURGERY and the MORNING OF SURGERY with CHG.   2. If you chose to wash your hair, wash your hair first as usual with your normal shampoo.  3. After you shampoo, rinse your hair and body thoroughly to remove the shampoo.  4. Use CHG as you would any other liquid soap. You can apply CHG directly to the skin and wash gently with a scrungie or a clean washcloth.   5. Apply the CHG Soap to your body ONLY FROM THE NECK DOWN.  Do not use on open wounds or open sores. Avoid contact with your eyes, ears, mouth and genitals (private parts). Wash Face and genitals (private parts)  with your normal soap.  6. Wash thoroughly, paying special attention to the area where your surgery will be performed.  7. Thoroughly rinse your body with warm water from the neck down.  8. DO NOT shower/wash with your normal soap after using and rinsing off the CHG Soap.  9. Pat yourself dry with a CLEAN TOWEL.  10. Wear CLEAN PAJAMAS  to bed the night before surgery, wear comfortable clothes the morning of surgery  11. Place CLEAN SHEETS on your bed the night of your first shower and DO NOT SLEEP WITH PETS.    Day of Surgery:  Do not apply any deodorants/lotions.  Please wear clean clothes to the hospital/surgery center.   Remember to brush your teeth WITH YOUR REGULAR TOOTHPASTE.    Please read over the following fact sheets that you were given. Coughing and Deep Breathing and Surgical Site Infection Prevention

## 2018-03-22 ENCOUNTER — Encounter (HOSPITAL_COMMUNITY): Payer: Self-pay

## 2018-03-22 ENCOUNTER — Other Ambulatory Visit: Payer: Self-pay

## 2018-03-22 ENCOUNTER — Encounter (HOSPITAL_COMMUNITY)
Admission: RE | Admit: 2018-03-22 | Discharge: 2018-03-22 | Disposition: A | Discharge status: Home / Self Care | Payer: Medicare Other | Source: Ambulatory Visit | Attending: General Surgery | Admitting: General Surgery

## 2018-03-22 DIAGNOSIS — Z01812 Encounter for preprocedural laboratory examination: Secondary | ICD-10-CM | POA: Insufficient documentation

## 2018-03-22 LAB — BASIC METABOLIC PANEL
ANION GAP: 6 (ref 5–15)
BUN: 30 mg/dL — AB (ref 8–23)
CO2: 23 mmol/L (ref 22–32)
Calcium: 9.1 mg/dL (ref 8.9–10.3)
Chloride: 110 mmol/L (ref 98–111)
Creatinine, Ser: 2.18 mg/dL — ABNORMAL HIGH (ref 0.61–1.24)
GFR, EST AFRICAN AMERICAN: 33 mL/min — AB (ref >60)
GFR, EST NON AFRICAN AMERICAN: 29 mL/min — AB (ref >60)
Glucose, Bld: 137 mg/dL — ABNORMAL HIGH (ref 70–99)
POTASSIUM: 4.1 mmol/L (ref 3.5–5.1)
SODIUM: 139 mmol/L (ref 135–145)

## 2018-03-22 LAB — CBC
HCT: 45.5 % (ref 39.0–52.0)
Hemoglobin: 14 g/dL (ref 13.0–17.0)
MCH: 28.2 pg (ref 26.0–34.0)
MCHC: 30.8 g/dL (ref 30.0–36.0)
MCV: 91.5 fL (ref 80.0–100.0)
NRBC: 0 % (ref 0.0–0.2)
PLATELETS: 170 10*3/uL (ref 150–400)
RBC: 4.97 MIL/uL (ref 4.22–5.81)
RDW: 14.3 % (ref 11.5–15.5)
WBC: 5.5 10*3/uL (ref 4.0–10.5)

## 2018-03-22 NOTE — Progress Notes (Signed)
PCP is Dr. Marval Regal  (patient has Stage 1 kidney disease) Patient had been scheduled earlier for surgery and was cancelled d/t newly dx LBBB Has been to see Dr. Basilio Cairo in Sept 2019 Echo & stress tests were done 01/2018 04/2016 was dx with colon caner, currently sees Dr. Benay Spice.  Last dose of chem Jan thr June of 2018

## 2018-03-22 NOTE — Pre-Procedure Instructions (Signed)
Louis [name]  03/22/2018      [id number] [place], [address] AT [facility] OF [place] [address] [place] [address] Phone: [phone] Fax: [phone]    Your procedure is scheduled on Monday November 25.   Report to [facility] Admitting at 5:30 A.M.             (posted surgery time 7:30a - 9a)   Call this number if you have problems the morning of surgery:  [phone]   Remember:  Do not eat any foods or drink any liquids after midnight, Sunday.    Take these medicines the morning of surgery with A SIP OF WATER:   Amlodipine (norvasc) Labetalol (Normodyne) Loratadine (Claratin)  7 days prior to surgery STOP taking any Aspirin(unless otherwise instructed by your surgeon), Aleve, Naproxen, Ibuprofen, Motrin, Advil, Goody's, BC's, all herbal medications, fish oil, and all vitamins     Do not wear jewelry  Do not wear lotions, colognes, or deodorant.   Men may shave face and neck.  Do not bring valuables to the hospital.  [facility] is not responsible for any belongings or valuables.  Contacts, dentures or bridgework may not be worn into surgery.  Leave your suitcase in the car.  After surgery it may be brought to your room.  For patients admitted to the hospital, discharge time will be determined by your treatment team.  Patients discharged the day of surgery will not be allowed to drive home, and you will need someone to stay with you for the first 24 hrs.    Special instructions:    [place]- Preparing For Surgery  Before surgery, you can play an important role. Because skin is not sterile, your skin needs to be as free of germs as possible. You can reduce the number of germs on your skin by washing with CHG (chlorahexidine gluconate) Soap before surgery.  CHG is an antiseptic cleaner which kills germs and bonds with the skin to continue killing germs even after washing.    Oral  Hygiene is also important to reduce your risk of infection.    Remember - BRUSH YOUR TEETH THE MORNING OF SURGERY WITH YOUR REGULAR TOOTHPASTE  Please do not use if you have an allergy to CHG or antibacterial soaps. If your skin becomes reddened/irritated stop using the CHG.  Do not shave (including legs and underarms) for at least 48 hours prior to first CHG shower. It is OK to shave your face.  Please follow these instructions carefully.   1. Shower the NIGHT BEFORE SURGERY and the MORNING OF SURGERY with CHG.   2. If you chose to wash your hair, wash your hair first as usual with your normal shampoo.  3. After you shampoo, rinse your hair and body thoroughly to remove the shampoo.  4. Use CHG as you would any other liquid soap. You can apply CHG directly to the skin and wash gently with a scrungie or a clean washcloth.   5. Apply the CHG Soap to your body ONLY FROM THE NECK DOWN.  Do not use on open wounds or open sores. Avoid contact with your eyes, ears, mouth and genitals (private parts). Wash Face and genitals (private parts)  with your normal soap.  6. Wash thoroughly, paying special attention to the area where your surgery will be performed.  7. Thoroughly rinse your body with warm water from the neck down.  8. DO NOT shower/wash with your normal soap after using and rinsing off the CHG Soap.  9. Pat yourself dry with a CLEAN TOWEL.  10. Wear CLEAN PAJAMAS to bed the night before surgery, wear comfortable clothes the morning of surgery  11. Place CLEAN SHEETS on your bed the night of your first shower and DO NOT SLEEP WITH PETS.  Day of Surgery:  Do not apply any deodorants/lotions.  Please wear clean clothes to the hospital/surgery center.   Remember to brush your teeth WITH YOUR REGULAR TOOTHPASTE.  Please read over the following fact sheets that you were given. Surgical Site Infection Prevention

## 2018-03-23 ENCOUNTER — Encounter: Payer: Self-pay | Admitting: Physical Therapy

## 2018-03-23 ENCOUNTER — Ambulatory Visit: Payer: Medicare Other | Admitting: Physical Therapy

## 2018-03-23 DIAGNOSIS — M6281 Muscle weakness (generalized): Secondary | ICD-10-CM | POA: Diagnosis not present

## 2018-03-23 DIAGNOSIS — M25561 Pain in right knee: Secondary | ICD-10-CM

## 2018-03-23 DIAGNOSIS — M25562 Pain in left knee: Secondary | ICD-10-CM | POA: Diagnosis not present

## 2018-03-23 DIAGNOSIS — G8929 Other chronic pain: Secondary | ICD-10-CM

## 2018-03-23 NOTE — Anesthesia Preprocedure Evaluation (Addendum)
Anesthesia Evaluation  Patient identified by MRN, date of birth, ID band Patient awake    Reviewed: Allergy & Precautions, NPO status , Patient's Chart, lab work & pertinent test results  Airway Mallampati: III  TM Distance: >3 FB Neck ROM: Full    Dental no notable dental hx.    Pulmonary neg pulmonary ROS,    Pulmonary exam normal breath sounds clear to auscultation       Cardiovascular hypertension, Pt. on medications and Pt. on home beta blockers Normal cardiovascular exam Rhythm:Regular Rate:Normal  ECG: NSR, rate 72. LBBB  ECHO: Normal LV size with moderate LV hypertrophy. EF 55% with septal-lateral dyssynchrony consistent with LBBB. Normal RV size and systolic function. No significant valvular abnormalities.  Myocardial Nuclear stress EF: 46%. There was no ST segment deviation noted during stress. The study is normal. This is a low risk study. The left ventricular ejection fraction is mildly decreased (45-54%).   There is no evidence of prior infarct or ischemia. LVEF appears slightly better than calculated, there is paradoxical septal motion (LBBB). Correlation with an echocardiogram is recommended.   Pre-op eval per cardiology (Ravankar)   Neuro/Psych negative neurological ROS  negative psych ROS   GI/Hepatic negative GI ROS, Neg liver ROS, Colon CA s/p surgery and chemo   Endo/Other  negative endocrine ROS  Renal/GU CRFRenal disease     Musculoskeletal negative musculoskeletal ROS (+)   Abdominal   Peds  Hematology negative hematology ROS (+) HLD   Anesthesia Other Findings RIGHT INGUINAL HERNIA  Reproductive/Obstetrics                           Anesthesia Physical Anesthesia Plan  ASA: III  Anesthesia Plan: General and Regional   Post-op Pain Management: GA combined w/ Regional for post-op pain   Induction: Intravenous  PONV Risk Score and Plan: 2 and Ondansetron,  Dexamethasone and Treatment may vary due to age or medical condition  Airway Management Planned: Oral ETT  Additional Equipment:   Intra-op Plan:   Post-operative Plan: Extubation in OR  Informed Consent: I have reviewed the patients History and Physical, chart, labs and discussed the procedure including the risks, benefits and alternatives for the proposed anesthesia with the patient or authorized representative who has indicated his/her understanding and acceptance.   Dental advisory given  Plan Discussed with: CRNA  Anesthesia Plan Comments: (Reviewed PAT note 01/07/2018 by Karoline [place], PA-C. Surgery previously cancelled due to new LBBB at PAT testing. He subsequently saw Dr. Geraldo Pitter 01/12/18 )     Anesthesia Quick Evaluation

## 2018-03-23 NOTE — Therapy (Signed)
[facility] [address], [place], [place], [address] Phone: [phone]   Fax:  [phone]  Physical Therapy Treatment  Patient Details  Name: Louis [name] MRN: [id number] Date of Birth: 03-23-1946 Referring Provider (PT): Marella Chimes [facility]   Encounter Date: 03/23/2018  PT End of Session - 03/23/18 0900    Visit Number  7    Date for PT Re-Evaluation  05/06/18    Authorization Type  Medicare    PT Start Time  0839    PT Stop Time  0918    PT Time Calculation (min)  39 min    Activity Tolerance  Patient tolerated treatment well       Past Medical History:  Diagnosis Date  . Allergy   . Arthritis   . CKD (chronic kidney disease) stage 2, GFR 60-89 ml/min    see [place], Stage 1  . colon ca dx'd 04/2016   colon  . Heart murmur    many years   . Hernia, abdominal   . Hyperlipidemia   . Hypertension   . Ileus ([place])   . Small bowel obstruction ([place])   . 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    Past Surgical History:  Procedure Laterality Date  . BIOPSY  04/10/2016   Procedure: BIOPSY Mesentary;  Surgeon: Judeth Horn, MD;  Location: [place];  Service: General;;  . COLOSTOMY    . COLOSTOMY REVERSAL N/A 01/12/2017   Procedure: COLOSTOMY REVERSAL;  Surgeon: Judeth Horn, MD;  Location: [place];  Service: General;  Laterality: N/A;  . FLEXIBLE SIGMOIDOSCOPY N/A 04/09/2016   Procedure: Beryle Quant;  Surgeon: Ladene Artist, MD;  Location[facility];  Service: Endoscopy;  Laterality: N/A;  . HERNIA REPAIR     in childhood  . IR GENERIC HISTORICAL  05/14/2016   IR US GUIDE VASC ACCESS RIGHT 05/14/2016 Corrie Mckusick, DO [facility]  . IR GENERIC HISTORICAL  05/14/2016   IR FLUORO GUIDE PORT INSERTION RIGHT 05/14/2016 Corrie Mckusick, DO [facility]  . IR REMOVAL TUN ACCESS W/ PORT W/O FL MOD SED  11/11/2016  . PARTIAL COLECTOMY N/A 04/10/2016   Procedure: SIGMOID  COLECTOMY WITH COLOSTOMY;  Surgeon:  Judeth Horn, MD;  Location: [place];  Service: General;  Laterality: N/A;  . TONSILLECTOMY      There were no vitals filed for this visit.  Subjective Assessment - 03/23/18 0843    Subjective  My right knee is hurting.  I walked on it too much at a trade show over the weekend.  Having (unrelated)  surgery on Monday.      Currently in Pain?  Yes    Pain Score  5     Pain Location  Knee    Pain Orientation  Right    Aggravating Factors   walking, standing                        [facility] Adult PT Treatment/Exercise - 03/23/18 0001      Knee/Hip Exercises: Stretches   Active Hamstring Stretch  Both;3 reps;20 seconds    Active Hamstring Stretch Limitations  seated      Knee/Hip Exercises: Aerobic   Stationary Bike  L4 5 min     Nustep  Level 2 10  minutes LEs only    PT present to discuss progress; decreased intensity      Knee/Hip Exercises: Machines for Strengthening   Total Gym Leg Press  --  Knee/Hip Exercises: Standing   Heel Raises  Both;2 sets;10 reps    Hip Abduction  Stengthening;Right;Left;15 reps    Abduction Limitations  0# no weights today secondary to pain    Hip Extension  Stengthening;Both;2 sets;10 reps    Extension Limitations  0# no weights secondary to pain.      Other Standing Knee Exercises  step taps 15x    Other Standing Knee Exercises  side stepping right and left 10x      Knee/Hip Exercises: Seated   Long Arc Quad  Strengthening;Right;Left;20 reps;Weights    Long Arc Quad Weight  3 lbs.    Heel Slides  Right;Left;15 reps    Heel Slides Limitations  green band     Clamshell with TheraBand  Blue   3x10   Marching  Strengthening;Right;Left;15 reps    Marching Limitations  3#                PT Short Term Goals - 03/18/18 0903      PT SHORT TERM GOAL #2   Title  improved 5 times sit <> stand to 12 sec or better    Baseline  12.3 with black pad in chair    Time  4    Period  Weeks    Status  On-going        PT Long  Term Goals - 03/18/18 2703      PT LONG TERM GOAL #3   Title  able to descend stairs and declines with 50% less pain in the knees.    Baseline  20% less with steps and improved stability    Time  8    Period  Weeks    Status  On-going            Plan - 03/23/18 0913    Clinical Impression Statement  Treatment modified in response to increased right knee pain today from prolonged standing over the weekend.  Antalgic, stiff gait.  Avoided prolonged standing today and decreased resistance.  Therapist closely monitoring response throughout treatment session.  The patient has an upcoming surgery next week and expresses readiness for discharge next visit from PT.      Rehab Potential  Excellent    PT Duration  8 weeks    PT Treatment/Interventions  ADLs/Self Care Home Management;Electrical Stimulation;Moist Heat;Cryotherapy;Gait training;Stair training;Therapeutic exercise;Balance training;Neuromuscular re-education;Patient/family education;Passive range of motion;Manual techniques    PT Next Visit Plan  recheck knee extension ROM;  FOTO;  Check goals;  hamstring flexibility, strength, endurance, balance;  plan for discharge next visit    PT Home Exercise Plan  Access Code: [email or web address]       Patient will benefit from skilled therapeutic intervention in order to improve the following deficits and impairments:  Abnormal gait, Pain, Decreased strength, Decreased range of motion, Postural dysfunction, Impaired flexibility, Decreased balance  Visit Diagnosis: Muscle weakness (generalized)  Chronic pain of left knee  Chronic pain of right knee     Problem List Patient Active Problem List   Diagnosis Date Noted  . New onset left bundle branch block (LBBB) 01/12/2018  . Atherosclerotic vascular disease 01/12/2018  . 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 Port catheter in place 07/02/2016  . 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 Elevated fasting glucose 04/10/2016  . Right  inguinal hernia 04/10/2016  . Adenocarcinoma of colon ([place]) 04/10/2016  . Enterocolitis   . Essential hypertension 04/04/2016  . Acute kidney injury superimposed on chronic kidney disease Stage 2 ([place])  04/04/2016  . Ileus ([place]) 04/02/2016   Ruben Im, PT 03/23/18 9:23 AM Phone: [phone] Fax: [phone]  Alvera Singh 03/23/2018, Sandi Mariscal AM  [facility] [address], [place], [place], [address] Phone: [phone]   Fax:  [phone]  Name: KALIN KYLER MRN: [id number] Date of Birth: June 14, 1945

## 2018-03-25 ENCOUNTER — Ambulatory Visit: Payer: Medicare Other

## 2018-03-25 DIAGNOSIS — M25561 Pain in right knee: Secondary | ICD-10-CM | POA: Diagnosis not present

## 2018-03-25 DIAGNOSIS — G8929 Other chronic pain: Secondary | ICD-10-CM | POA: Diagnosis not present

## 2018-03-25 DIAGNOSIS — M6281 Muscle weakness (generalized): Secondary | ICD-10-CM

## 2018-03-25 DIAGNOSIS — M25562 Pain in left knee: Secondary | ICD-10-CM

## 2018-03-25 NOTE — Therapy (Signed)
[facility] [address] [place], [place], [address] Phone: [phone]   Fax:  [phone]  Physical Therapy Treatment  Patient Details  Name: Louis [name] MRN: [id number] Date of Birth: 06/20/1945 Referring Provider (PT): Marella Chimes [facility]   Encounter Date: 03/25/2018  PT End of Session - 03/25/18 0927    Visit Number  8    PT Start Time  7517    PT Stop Time  0927    PT Time Calculation (min)  40 min    Activity Tolerance  Patient tolerated treatment well    Behavior During Therapy  [facility] for tasks assessed/performed       Past Medical History:  Diagnosis Date  . Allergy   . Arthritis   . CKD (chronic kidney disease) stage 2, GFR 60-89 ml/min    see [place], Stage 1  . colon ca dx'd 04/2016   colon  . Heart murmur    many years   . Hernia, abdominal   . Hyperlipidemia   . Hypertension   . Ileus ([place])   . Small bowel obstruction ([place])   . 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    Past Surgical History:  Procedure Laterality Date  . BIOPSY  04/10/2016   Procedure: BIOPSY Mesentary;  Surgeon: Judeth Horn, MD;  Location: [place];  Service: General;;  . COLOSTOMY    . COLOSTOMY REVERSAL N/A 01/12/2017   Procedure: COLOSTOMY REVERSAL;  Surgeon: Judeth Horn, MD;  Location: [place];  Service: General;  Laterality: N/A;  . FLEXIBLE SIGMOIDOSCOPY N/A 04/09/2016   Procedure: Beryle Quant;  Surgeon: Ladene Artist, MD;  Location: [facility] ENDOSCOPY;  Service: Endoscopy;  Laterality: N/A;  . HERNIA REPAIR     in childhood  . IR GENERIC HISTORICAL  05/14/2016   IR US GUIDE VASC ACCESS RIGHT 05/14/2016 Corrie Mckusick, DO [facility]  . IR GENERIC HISTORICAL  05/14/2016   IR FLUORO GUIDE PORT INSERTION RIGHT 05/14/2016 Corrie Mckusick, DO [facility]  . IR REMOVAL TUN ACCESS W/ PORT W/O FL MOD SED  11/11/2016  . PARTIAL COLECTOMY N/A 04/10/2016   Procedure: SIGMOID  COLECTOMY WITH COLOSTOMY;  Surgeon: Judeth Horn,  MD;  Location: [place];  Service: General;  Laterality: N/A;  . TONSILLECTOMY      There were no vitals filed for this visit.  Subjective Assessment - 03/25/18 0850    Subjective  I am having hernia repair on Monday.  I am ready for discharge.      Pertinent History  colon cancer 2017, HTN, stage1 kidney disease, arthritis, left plantar facitis    Patient Stated Goals  get stronger, decrease pain    Currently in Pain?  Yes    Pain Score  2     Pain Location  Knee    Pain Orientation  Right    Pain Descriptors / Indicators  Aching    Pain Type  Chronic pain    Pain Onset  More than a month ago    Pain Frequency  Intermittent    Aggravating Factors   walking, standing    Pain Relieving Factors  walking, exercise         [facility] PT Assessment - 03/25/18 0001      Assessment   Medical Diagnosis  bil leg weakness, risk for falls, decondiitioning    Referring Provider (PT)  Marella Chimes [facility]    Next MD Visit  6  months  Home Environment   Living Environment  Private residence      Prior Function   Level of Independence  Independent      Cognition   Overall Cognitive Status  Within Functional Limits for tasks assessed      AROM   Overall AROM Comments  Lt knee extension -13 degrees      Strength   Right Hip Flexion  4-/5    Left Hip Flexion  4-/5    Right Knee Flexion  4+/5    Right Knee Extension  4+/5    Left Knee Flexion  4/5    Left Knee Extension  5/5                   [facility] Adult PT Treatment/Exercise - 03/25/18 0001      Knee/Hip Exercises: Aerobic   Nustep  Level 2 10  minutes LEs only    PT present to discuss progress     Knee/Hip Exercises: Standing   Heel Raises  Both;2 sets;10 reps    Hip Flexion  Stengthening;Both;2 sets;10 reps    Hip Flexion Limitations  3#    Hip Abduction  Stengthening;Right;Left;15 reps    Abduction Limitations  3#    Hip Extension  Stengthening;Both;2 sets;10 reps    Extension Limitations  3#      Knee/Hip  Exercises: Seated   Long Arc Quad  Strengthening;Right;Left;20 reps;Weights    Long Arc Quad Weight  3 lbs.    Clamshell with TheraBand  Blue   3x10   Marching  Strengthening;Right;Left;15 reps    Marching Limitations  3#                PT Short Term Goals - 03/18/18 0903      PT SHORT TERM GOAL #2   Title  improved 5 times sit <> stand to 12 sec or better    Baseline  12.3 with black pad in chair    Time  4    Period  Weeks    Status  On-going        PT Long Term Goals - 03/25/18 9798      PT LONG TERM GOAL #1   Title  Pt to demo improved BLE strength to 4-4+/5 or better to improve function.    Status  Partially Met      PT LONG TERM GOAL #2   Title  improved left knee extension to -5 deg or better to help normalize gait and decrease pain    Baseline  -13 degrees    Status  Achieved      PT LONG TERM GOAL #3   Title  able to descend stairs and declines with 50% less pain in the knees.    Baseline  able to ascend/descend with forward facing motion.  20% less    Status  Partially Met      PT LONG TERM GOAL #4   Title  Pt to demonstrate improved step/stride length with gait to WNL    Status  Achieved            Plan - 03/25/18 0905    Clinical Impression Statement  Pt will have hernia repair surgery 03/29/18 and will discharge from PT today.  Pt has a comprehensive HEP in place for strength, flexibility and balance. Pt reports 20% reduction in knee pain with negotiating steps and is now able to do this with proper mechanics. Pt with continued knee extension limitations Lt>Rt.  LE  strength has improved since evaluation.  PT reviewed all HEP with pt and he is able to demonstrate all aspects correctly. Minor verbal cues are required for standing posture with exercise.  Pt will discharge today and follow-up with MD as needed.      PT Next Visit Plan  D/C PT to HEP    PT Home Exercise Plan  Access Code: [email or web address]    Consulted and Agree with Plan of Care  Patient        Patient will benefit from skilled therapeutic intervention in order to improve the following deficits and impairments:     Visit Diagnosis: Muscle weakness (generalized)  Chronic pain of left knee  Chronic pain of right knee     Problem List Patient Active Problem List   Diagnosis Date Noted  . New onset left bundle branch block (LBBB) 01/12/2018  . Atherosclerotic vascular disease 01/12/2018  . Colostomy in place [facility]) 01/12/2017  . Port catheter in place 07/02/2016  . 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 Elevated fasting glucose 04/10/2016  . Right inguinal hernia 04/10/2016  . Adenocarcinoma of colon ([place]) 04/10/2016  . Enterocolitis   . Essential hypertension 04/04/2016  . Acute kidney injury superimposed on chronic kidney disease Stage 2 ([place]) 04/04/2016  . Ileus ([place]) 04/02/2016  PHYSICAL THERAPY DISCHARGE SUMMARY  Visits from Start of Care: 8  Current functional level related to goals / functional outcomes: Pt will discharge to HEP for strength and endurance gains.  Pt will have hernia repair surgery and will resume with HEP when allowed by surgeon.     Remaining deficits: Bil LE weakness and limited knee A/ROM   Education / Equipment: HEP Plan: Patient agrees to discharge.  Patient goals were partially met. Patient is being discharged due to being pleased with the current functional level.  ?????        Sigurd Sos, PT 03/25/18 9:33 AM  [facility] [address], [place], [place], [address] Phone: [phone]   Fax:  [phone]  Name: Louis [name] MRN: [id number] Date of Birth: [birth year]

## 2018-03-29 ENCOUNTER — Ambulatory Visit (HOSPITAL_COMMUNITY): Payer: Medicare Other | Admitting: Certified Registered Nurse Anesthetist

## 2018-03-29 ENCOUNTER — Other Ambulatory Visit: Payer: Self-pay

## 2018-03-29 ENCOUNTER — Encounter (HOSPITAL_COMMUNITY): Payer: Self-pay | Admitting: *Deleted

## 2018-03-29 ENCOUNTER — Ambulatory Visit (HOSPITAL_COMMUNITY)
Admission: RE | Admit: 2018-03-29 | Discharge: 2018-03-30 | Disposition: A | Discharge status: Home / Self Care | Payer: Medicare Other | Source: Ambulatory Visit | Attending: General Surgery | Admitting: General Surgery

## 2018-03-29 ENCOUNTER — Encounter (HOSPITAL_COMMUNITY)
Admission: RE | Disposition: A | Discharge status: Home / Self Care | Payer: Self-pay | Source: Ambulatory Visit | Attending: General Surgery

## 2018-03-29 ENCOUNTER — Ambulatory Visit (HOSPITAL_COMMUNITY): Payer: Medicare Other | Admitting: Physician Assistant

## 2018-03-29 DIAGNOSIS — Z9889 Other specified postprocedural states: Secondary | ICD-10-CM

## 2018-03-29 DIAGNOSIS — Z9221 Personal history of antineoplastic chemotherapy: Secondary | ICD-10-CM | POA: Diagnosis not present

## 2018-03-29 DIAGNOSIS — K4090 Unilateral inguinal hernia, without obstruction or gangrene, not specified as recurrent: Secondary | ICD-10-CM | POA: Diagnosis not present

## 2018-03-29 DIAGNOSIS — I10 Essential (primary) hypertension: Secondary | ICD-10-CM | POA: Diagnosis not present

## 2018-03-29 DIAGNOSIS — K4030 Unilateral inguinal hernia, with obstruction, without gangrene, not specified as recurrent: Secondary | ICD-10-CM | POA: Insufficient documentation

## 2018-03-29 DIAGNOSIS — E785 Hyperlipidemia, unspecified: Secondary | ICD-10-CM | POA: Insufficient documentation

## 2018-03-29 DIAGNOSIS — I447 Left bundle-branch block, unspecified: Secondary | ICD-10-CM | POA: Insufficient documentation

## 2018-03-29 DIAGNOSIS — D176 Benign lipomatous neoplasm of spermatic cord: Secondary | ICD-10-CM | POA: Diagnosis not present

## 2018-03-29 DIAGNOSIS — Z79899 Other long term (current) drug therapy: Secondary | ICD-10-CM | POA: Insufficient documentation

## 2018-03-29 DIAGNOSIS — Z85038 Personal history of other malignant neoplasm of large intestine: Secondary | ICD-10-CM | POA: Diagnosis not present

## 2018-03-29 DIAGNOSIS — Z8719 Personal history of other diseases of the digestive system: Secondary | ICD-10-CM

## 2018-03-29 DIAGNOSIS — G8918 Other acute postprocedural pain: Secondary | ICD-10-CM | POA: Diagnosis not present

## 2018-03-29 HISTORY — PX: INGUINAL HERNIA REPAIR: SHX194

## 2018-03-29 HISTORY — PX: INSERTION OF MESH: SHX5868

## 2018-03-29 SURGERY — REPAIR, HERNIA, INGUINAL, ADULT
Anesthesia: Regional | Site: Inguinal | Laterality: Right

## 2018-03-29 MED ORDER — ROCURONIUM BROMIDE 10 MG/ML (PF) SYRINGE
PREFILLED_SYRINGE | INTRAVENOUS | Status: DC | PRN
  Administered 2018-03-29 (×2): 20 mg via INTRAVENOUS
  Administered 2018-03-29 (×2): 10 mg via INTRAVENOUS
  Administered 2018-03-29: 30 mg via INTRAVENOUS

## 2018-03-29 MED ORDER — LACTATED RINGERS IV SOLN
INTRAVENOUS | Status: DC | PRN
  Administered 2018-03-29: 07:00:00 via INTRAVENOUS

## 2018-03-29 MED ORDER — POLYETHYLENE GLYCOL 3350 17 G PO PACK: 17.0000 g | PACK | Freq: Every day | ORAL | Status: DC | PRN

## 2018-03-29 MED ORDER — ENOXAPARIN SODIUM 40 MG/0.4ML ~~LOC~~ SOLN: 40.0000 mg | SUBCUTANEOUS | Status: DC

## 2018-03-29 MED ORDER — ACETAMINOPHEN 500 MG PO TABS
1000.0000 mg | ORAL_TABLET | Freq: Four times a day (QID) | ORAL | Status: DC
  Administered 2018-03-29 – 2018-03-30 (×4): 1000 mg via ORAL
  Filled 2018-03-29 (×4): qty 2

## 2018-03-29 MED ORDER — BUPIVACAINE-EPINEPHRINE (PF) 0.25% -1:200000 IJ SOLN
INTRAMUSCULAR | Status: AC
  Filled 2018-03-29: qty 30

## 2018-03-29 MED ORDER — ONDANSETRON 4 MG PO TBDP: 4.0000 mg | ORAL_TABLET | Freq: Four times a day (QID) | ORAL | Status: DC | PRN

## 2018-03-29 MED ORDER — SUGAMMADEX SODIUM 500 MG/5ML IV SOLN
INTRAVENOUS | Status: AC
  Filled 2018-03-29: qty 5

## 2018-03-29 MED ORDER — DEXAMETHASONE SODIUM PHOSPHATE 10 MG/ML IJ SOLN
INTRAMUSCULAR | Status: AC
  Filled 2018-03-29: qty 1

## 2018-03-29 MED ORDER — LIDOCAINE 2% (20 MG/ML) 5 ML SYRINGE
INTRAMUSCULAR | Status: DC | PRN
  Administered 2018-03-29: 50 mg via INTRAVENOUS

## 2018-03-29 MED ORDER — MIDAZOLAM HCL 2 MG/2ML IJ SOLN
INTRAMUSCULAR | Status: AC
  Filled 2018-03-29: qty 2

## 2018-03-29 MED ORDER — LIDOCAINE 2% (20 MG/ML) 5 ML SYRINGE
INTRAMUSCULAR | Status: AC
  Filled 2018-03-29: qty 5

## 2018-03-29 MED ORDER — ROCURONIUM BROMIDE 50 MG/5ML IV SOSY
PREFILLED_SYRINGE | INTRAVENOUS | Status: AC
  Filled 2018-03-29: qty 5

## 2018-03-29 MED ORDER — CEFAZOLIN SODIUM-DEXTROSE 2-3 GM-%(50ML) IV SOLR
INTRAVENOUS | Status: DC | PRN
  Administered 2018-03-29: 2 g via INTRAVENOUS

## 2018-03-29 MED ORDER — PNEUMOCOCCAL VAC POLYVALENT 25 MCG/0.5ML IJ INJ: 0.5000 mL | INJECTION | INTRAMUSCULAR | Status: DC

## 2018-03-29 MED ORDER — FENTANYL CITRATE (PF) 100 MCG/2ML IJ SOLN
INTRAMUSCULAR | Status: DC | PRN
  Administered 2018-03-29 (×3): 50 ug via INTRAVENOUS

## 2018-03-29 MED ORDER — OXYCODONE HCL 5 MG PO TABS: 5.0000 mg | ORAL_TABLET | ORAL | Status: DC | PRN

## 2018-03-29 MED ORDER — FENTANYL CITRATE (PF) 250 MCG/5ML IJ SOLN
INTRAMUSCULAR | Status: AC
  Filled 2018-03-29: qty 5

## 2018-03-29 MED ORDER — EPHEDRINE 5 MG/ML INJ
INTRAVENOUS | Status: AC
  Filled 2018-03-29: qty 10

## 2018-03-29 MED ORDER — SODIUM CHLORIDE 0.9 % IV SOLN
INTRAVENOUS | Status: DC
  Administered 2018-03-29: 14:00:00 via INTRAVENOUS

## 2018-03-29 MED ORDER — AMLODIPINE BESYLATE 10 MG PO TABS: 10.0000 mg | ORAL_TABLET | Freq: Every day | ORAL | Status: DC

## 2018-03-29 MED ORDER — METHOCARBAMOL 500 MG PO TABS: 500.0000 mg | ORAL_TABLET | Freq: Four times a day (QID) | ORAL | Status: DC | PRN

## 2018-03-29 MED ORDER — DOCUSATE SODIUM 100 MG PO CAPS
100.0000 mg | ORAL_CAPSULE | Freq: Two times a day (BID) | ORAL | Status: DC
  Administered 2018-03-29 (×2): 100 mg via ORAL
  Filled 2018-03-29 (×2): qty 1

## 2018-03-29 MED ORDER — ONDANSETRON HCL 4 MG/2ML IJ SOLN
INTRAMUSCULAR | Status: AC
  Filled 2018-03-29: qty 2

## 2018-03-29 MED ORDER — FENTANYL CITRATE (PF) 100 MCG/2ML IJ SOLN: 25.0000 ug | INTRAMUSCULAR | Status: DC | PRN

## 2018-03-29 MED ORDER — PHENYLEPHRINE 40 MCG/ML (10ML) SYRINGE FOR IV PUSH (FOR BLOOD PRESSURE SUPPORT)
PREFILLED_SYRINGE | INTRAVENOUS | Status: AC
  Filled 2018-03-29: qty 10

## 2018-03-29 MED ORDER — ONDANSETRON HCL 4 MG/2ML IJ SOLN: 4.0000 mg | Freq: Four times a day (QID) | INTRAMUSCULAR | Status: DC | PRN

## 2018-03-29 MED ORDER — ONDANSETRON HCL 4 MG/2ML IJ SOLN: 4.0000 mg | Freq: Once | INTRAMUSCULAR | Status: DC | PRN

## 2018-03-29 MED ORDER — TRAMADOL HCL 50 MG PO TABS
ORAL_TABLET | ORAL | Status: AC
  Filled 2018-03-29: qty 1

## 2018-03-29 MED ORDER — MIDAZOLAM HCL 5 MG/5ML IJ SOLN
INTRAMUSCULAR | Status: DC | PRN
  Administered 2018-03-29 (×2): 1 mg via INTRAVENOUS

## 2018-03-29 MED ORDER — SODIUM CHLORIDE 0.9 % IV SOLN
INTRAVENOUS | Status: DC | PRN
  Administered 2018-03-29: 500 mL

## 2018-03-29 MED ORDER — HYDROMORPHONE HCL 1 MG/ML IJ SOLN: 0.5000 mg | INTRAMUSCULAR | Status: DC | PRN

## 2018-03-29 MED ORDER — GABAPENTIN 300 MG PO CAPS
300.0000 mg | ORAL_CAPSULE | Freq: Two times a day (BID) | ORAL | Status: DC
  Administered 2018-03-29 (×2): 300 mg via ORAL
  Filled 2018-03-29 (×2): qty 1

## 2018-03-29 MED ORDER — PROPOFOL 10 MG/ML IV BOLUS
INTRAVENOUS | Status: DC | PRN
  Administered 2018-03-29: 40 mg via INTRAVENOUS
  Administered 2018-03-29: 100 mg via INTRAVENOUS
  Administered 2018-03-29: 40 mg via INTRAVENOUS

## 2018-03-29 MED ORDER — SUGAMMADEX SODIUM 500 MG/5ML IV SOLN
INTRAVENOUS | Status: DC | PRN
  Administered 2018-03-29: 250 mg via INTRAVENOUS

## 2018-03-29 MED ORDER — TRAMADOL HCL 50 MG PO TABS
50.0000 mg | ORAL_TABLET | Freq: Four times a day (QID) | ORAL | Status: DC | PRN
  Administered 2018-03-29 – 2018-03-30 (×3): 50 mg via ORAL
  Filled 2018-03-29 (×2): qty 1

## 2018-03-29 MED ORDER — PROPOFOL 10 MG/ML IV BOLUS
INTRAVENOUS | Status: AC
  Filled 2018-03-29: qty 40

## 2018-03-29 MED ORDER — BISACODYL 5 MG PO TBEC: 5.0000 mg | DELAYED_RELEASE_TABLET | Freq: Every day | ORAL | Status: DC | PRN

## 2018-03-29 MED ORDER — LORATADINE 10 MG PO TABS: 10.0000 mg | ORAL_TABLET | Freq: Every day | ORAL | Status: DC

## 2018-03-29 MED ORDER — CEFAZOLIN SODIUM-DEXTROSE 2-4 GM/100ML-% IV SOLN
INTRAVENOUS | Status: AC
  Filled 2018-03-29: qty 100

## 2018-03-29 MED ORDER — PHENYLEPHRINE 40 MCG/ML (10ML) SYRINGE FOR IV PUSH (FOR BLOOD PRESSURE SUPPORT)
PREFILLED_SYRINGE | INTRAVENOUS | Status: DC | PRN
  Administered 2018-03-29 (×4): 40 ug via INTRAVENOUS

## 2018-03-29 MED ORDER — ROPIVACAINE HCL 5 MG/ML IJ SOLN
INTRAMUSCULAR | Status: DC | PRN
  Administered 2018-03-29: 30 mL via PERINEURAL

## 2018-03-29 MED ORDER — SODIUM CHLORIDE 0.9 % IV SOLN
INTRAVENOUS | Status: AC
  Filled 2018-03-29: qty 500000

## 2018-03-29 MED ORDER — BUPIVACAINE-EPINEPHRINE (PF) 0.25% -1:200000 IJ SOLN
INTRAMUSCULAR | Status: DC | PRN
  Administered 2018-03-29: 10 mL via PERINEURAL

## 2018-03-29 MED ORDER — SODIUM CHLORIDE 0.9 % IV SOLN
INTRAVENOUS | Status: DC | PRN
  Administered 2018-03-29: 25 ug/min via INTRAVENOUS

## 2018-03-29 MED ORDER — 0.9 % SODIUM CHLORIDE (POUR BTL) OPTIME
TOPICAL | Status: DC | PRN
  Administered 2018-03-29: 1000 mL

## 2018-03-29 MED ORDER — ONDANSETRON HCL 4 MG/2ML IJ SOLN
INTRAMUSCULAR | Status: DC | PRN
  Administered 2018-03-29: 4 mg via INTRAVENOUS

## 2018-03-29 MED ORDER — LABETALOL HCL 100 MG PO TABS
200.0000 mg | ORAL_TABLET | Freq: Three times a day (TID) | ORAL | Status: DC
  Administered 2018-03-29 (×2): 200 mg via ORAL
  Filled 2018-03-29 (×2): qty 2

## 2018-03-29 MED ORDER — EPHEDRINE SULFATE-NACL 50-0.9 MG/10ML-% IV SOSY
PREFILLED_SYRINGE | INTRAVENOUS | Status: DC | PRN
  Administered 2018-03-29: 10 mg via INTRAVENOUS
  Administered 2018-03-29 (×2): 5 mg via INTRAVENOUS

## 2018-03-29 MED ORDER — DEXAMETHASONE SODIUM PHOSPHATE 10 MG/ML IJ SOLN
INTRAMUSCULAR | Status: DC | PRN
  Administered 2018-03-29: 10 mg via INTRAVENOUS

## 2018-03-29 MED ORDER — CEFAZOLIN SODIUM-DEXTROSE 2-4 GM/100ML-% IV SOLN
2.0000 g | Freq: Three times a day (TID) | INTRAVENOUS | Status: AC
  Administered 2018-03-29: 2 g via INTRAVENOUS
  Filled 2018-03-29: qty 100

## 2018-03-29 SURGICAL SUPPLY — 52 items
BAG DECANTER FOR FLEXI CONT (MISCELLANEOUS) ×4 IMPLANT
BLADE CLIPPER SURG (BLADE) ×4 IMPLANT
BLADE SURG 10 STRL SS (BLADE) ×4 IMPLANT
BLADE SURG 15 STRL LF DISP TIS (BLADE) ×2 IMPLANT
BLADE SURG 15 STRL SS (BLADE) ×2
CANISTER SUCT 3000ML PPV (MISCELLANEOUS) IMPLANT
CHLORAPREP W/TINT 26ML (MISCELLANEOUS) ×4 IMPLANT
CLEANER TIP ELECTROSURG 2X2 (MISCELLANEOUS) ×4 IMPLANT
CLOSURE WOUND 1/2 X4 (GAUZE/BANDAGES/DRESSINGS) ×1
COVER SURGICAL LIGHT HANDLE (MISCELLANEOUS) ×4 IMPLANT
COVER WAND RF STERILE (DRAPES) IMPLANT
DERMABOND ADVANCED (GAUZE/BANDAGES/DRESSINGS) ×2
DERMABOND ADVANCED .7 DNX12 (GAUZE/BANDAGES/DRESSINGS) ×2 IMPLANT
DRAIN PENROSE 1/2X12 LTX STRL (WOUND CARE) ×4 IMPLANT
DRAPE LAPAROTOMY TRNSV 102X78 (DRAPE) ×4 IMPLANT
DRAPE UTILITY XL STRL (DRAPES) ×4 IMPLANT
DRSG TEGADERM 4X4.75 (GAUZE/BANDAGES/DRESSINGS) ×4 IMPLANT
ELECT REM PT RETURN 9FT ADLT (ELECTROSURGICAL) ×4
ELECTRODE REM PT RTRN 9FT ADLT (ELECTROSURGICAL) ×2 IMPLANT
GAUZE 4X4 16PLY RFD (DISPOSABLE) ×8 IMPLANT
GLOVE BIOGEL PI IND STRL 8 (GLOVE) ×2 IMPLANT
GLOVE BIOGEL PI INDICATOR 8 (GLOVE) ×2
GLOVE ECLIPSE 7.5 STRL STRAW (GLOVE) ×4 IMPLANT
GOWN STRL REUS W/ TWL LRG LVL3 (GOWN DISPOSABLE) ×8 IMPLANT
GOWN STRL REUS W/TWL LRG LVL3 (GOWN DISPOSABLE) ×8
KIT BASIN OR (CUSTOM PROCEDURE TRAY) ×4 IMPLANT
KIT TURNOVER KIT B (KITS) ×4 IMPLANT
MESH HERNIA 3X6 (Mesh General) ×4 IMPLANT
NEEDLE HYPO 25GX1X1/2 BEV (NEEDLE) ×4 IMPLANT
NS IRRIG 1000ML POUR BTL (IV SOLUTION) ×4 IMPLANT
PACK SURGICAL SETUP 50X90 (CUSTOM PROCEDURE TRAY) ×4 IMPLANT
PAD ARMBOARD 7.5X6 YLW CONV (MISCELLANEOUS) ×4 IMPLANT
PENCIL BUTTON HOLSTER BLD 10FT (ELECTRODE) ×4 IMPLANT
PENCIL SMOKE EVACUATOR (MISCELLANEOUS) ×4 IMPLANT
SPECIMEN JAR SMALL (MISCELLANEOUS) ×4 IMPLANT
SPONGE INTESTINAL PEANUT (DISPOSABLE) ×4 IMPLANT
SPONGE LAP 18X18 X RAY DECT (DISPOSABLE) ×4 IMPLANT
STRIP CLOSURE SKIN 1/2X4 (GAUZE/BANDAGES/DRESSINGS) ×3 IMPLANT
SUT ETHIBOND 0 MO6 C/R (SUTURE) ×8 IMPLANT
SUT MON AB 4-0 PC3 18 (SUTURE) ×4 IMPLANT
SUT PROLENE 0 CT 2 (SUTURE) ×8 IMPLANT
SUT VIC AB 3-0 SH 27 (SUTURE) ×4
SUT VIC AB 3-0 SH 27X BRD (SUTURE) ×4 IMPLANT
SUT VICRYL AB 3 0 TIES (SUTURE) ×4 IMPLANT
SYR BULB 3OZ (MISCELLANEOUS) ×4 IMPLANT
SYR CONTROL 10ML LL (SYRINGE) ×4 IMPLANT
TOWEL OR 17X24 6PK STRL BLUE (TOWEL DISPOSABLE) ×4 IMPLANT
TOWEL OR 17X26 10 PK STRL BLUE (TOWEL DISPOSABLE) ×4 IMPLANT
TRAY FOLEY W/BAG SLVR 14FR (SET/KITS/TRAYS/PACK) ×4 IMPLANT
TUBE CONNECTING 12'X1/4 (SUCTIONS) ×1
TUBE CONNECTING 12X1/4 (SUCTIONS) ×3 IMPLANT
YANKAUER SUCT BULB TIP NO VENT (SUCTIONS) ×4 IMPLANT

## 2018-03-29 NOTE — Anesthesia Procedure Notes (Signed)
Procedure Name: Intubation Date/Time: 03/29/2018 7:41 AM Performed by: Colin Benton, CRNA Pre-anesthesia Checklist: Patient identified, Emergency Drugs available, Suction available and Patient being monitored Patient Re-evaluated:Patient Re-evaluated prior to induction Oxygen Delivery Method: Circle system utilized Preoxygenation: Pre-oxygenation with 100% oxygen Induction Type: IV induction Ventilation: Mask ventilation without difficulty and Oral airway inserted - appropriate to patient size Laryngoscope Size: Miller Grade View: Grade II Tube type: Oral Tube size: 8.0 mm Number of attempts: 1 Airway Equipment and Method: Stylet Placement Confirmation: ETT inserted through vocal cords under direct vision,  positive ETCO2 and breath sounds checked- equal and bilateral Secured at: 24 cm Tube secured with: Tape Dental Injury: Teeth and Oropharynx as per pre-operative assessment

## 2018-03-29 NOTE — Progress Notes (Signed)
Looks good and should be able to go home tomorrow AM.  Kathryne Eriksson. Dahlia Bailiff, MD, [place] [phone] [facility]

## 2018-03-29 NOTE — Op Note (Signed)
OPERATIVE REPORT  DATE OF OPERATION: 03/29/2018  PATIENT:  Louis [name]  72 y.o. male  PRE-OPERATIVE DIAGNOSIS:  RIGHT INGUINAL HERNIA  POST-OPERATIVE DIAGNOSIS:  RIGHT INGUINAL HERNIA, DIRECT AND INDIRECT WITH LIPOMA OF THE CORD AND INCARCERATED BLADDER  INDICATION(S) FOR OPERATION:  Symptomatic right inguinal hernia  FINDINGS:  Large direct sac with incarcerated bladder (300cc volume), large indirect sac, lipoma of the cord.  PROCEDURE:  Procedure(s): OPEN REPAIR OF RIGHT INGUINAL HERNIA WITH MESH INSERTION OF MESH RESECTION OF LIPOMA OF THE CORD  SURGEON:  Surgeon(s): Judeth Horn, MD  ASSISTANT: None  ANESTHESIA:   general  COMPLICATIONS:  None  EBL: 20 ml  BLOOD ADMINISTERED: none  DRAINS: none   SPECIMEN:  Source of Specimen:  Indirect sac  COUNTS CORRECT:  YES  PROCEDURE DETAILS: The patient was taken to the operating room and placed on the table in the supine position.  After an adequate general endotracheal anesthetic was administered, he was prepped and draped in the usual sterile manner exposing the right inguinal area.  The patient had been marked prior to the procedure.  A proper timeout was performed identifying the patient and the procedure to be performed.  We marked the area of the initial incision which was approximately 7 cm long which we extended to approximately 10 cm long during the procedure.  There is a curvilinear incision at the level of the superficial ring on the right side.  We taken down to and through the subcutaneous tissue using electrocautery, through Scarpa's fascia, down to the fascia of the external oblique.  We incised the internal oblique along its fibers down through the superficial ring.  It was apparent that we encountered a massive sized right inguinal hernia which extended down into the right inguinal canal and the spermatic cord on the right side.  We mobilized spermatic cord at the pubic tubercle encircling it and the  associated hernia around the Penrose drain.  We subsequently spent the majority of our time dissecting away the hernia sac away from the spermatic cord.  The initial dissection demonstrated that there was a large direct sac which included the wall of the bladder attached to the medial aspect of the spermatic cord.  Care was taken to dissect away the hernia sac and the bladder from the spermatic cord without injuring the bladder or the structures of the spermatic cord.  This was done so successfully and then subsequently we found that there was a large indirect sac associated with the direct sac or the bladder that was more superior and medial.  We mobilized the spermatic cord out of the way inferiorly with retraction.  We then dissected out the large indirect sac from the direct sac using electrocautery and blunt dissection isolating the indirect sac at its base where we subsequently suture ligated at its base with 2 stitches of 0 Ethibond suture.  We subsequently spend the rest of the time of placing the bladder back in the plane of the preperitoneal space below the fascial level where initial control of this was repaired using interrupted 0 Ethibond sutures.  The initial sutures were used to control and keep the bladder in place below the level of the abdominal wall muscles attaching the conjoined tendon to the reflected portion of the inguinal ligament and a Bassini type repair.  This helped control the contents of the bladder which were in the hernia sac and keep up below the fascial level.  We then implanted a oval piece of polypropylene  mesh attaching it to the tubercle using a 0 Prolene suture and the reflected portion of the inguinal ligament inferiorly and laterally using a running 0 Prolene suture.  Superiorly and medially it was attached to the conjoined tendon using the running 0 Prolene suture.  Because the hernia defect was a large 2 interrupted 0 Ethibond sutures were placed above the spermatic  cord at the internal ring in order to close down the defect directly.  At no time were we concerned about injuring the femoral vein or the femoral artery.  Once the mesh was in place which been soaked in antibiotic solution we irrigated with antibiotic solution.  We closed the external oblique fascia on top of the cord using running 3-0 Vicryl suture.  We injected 0.25% Marcaine with epinephrine into the subcutaneous tissue.  We closed Scarpa's fascia using interrupted 3-0 Vicryl sutures.  The skin was closed using a running subcuticular stitch of 4-0 Monocryl.  Dermabond, Steri-Strips, and Tegaderm were used to complete the dressing.  All needle counts, sponge counts, and instrument counts were correct.  PATIENT DISPOSITION:  PACU - hemodynamically stable.   Judeth Horn 11/25/20199:55 AM

## 2018-03-29 NOTE — Anesthesia Postprocedure Evaluation (Signed)
Anesthesia Post Note  Patient: Louis [name]  Procedure(s) Performed: OPEN REPAIR OF RIGHT INGUINAL HERNIA WITH MESH (Right Inguinal) INSERTION OF MESH (N/A Inguinal)     Patient location during evaluation: PACU Anesthesia Type: Regional and General Level of consciousness: awake and alert Pain management: pain level controlled Vital Signs Assessment: post-procedure vital signs reviewed and stable Respiratory status: spontaneous breathing, nonlabored ventilation, respiratory function stable and patient connected to nasal cannula oxygen Cardiovascular status: blood pressure returned to baseline and stable Postop Assessment: no apparent nausea or vomiting Anesthetic complications: no    Last Vitals:  Vitals:   03/29/18 1340 03/29/18 1808  BP: (!) 165/76 (!) 146/80  Pulse: 81 75  Resp: 18 19  Temp: (!) 36.3 C 36.8 C  SpO2: 98% 95%    Last Pain:  Vitals:   03/29/18 1914  TempSrc:   PainSc: 1                  Eulala Newcombe P Jakyri Brunkhorst

## 2018-03-29 NOTE — Transfer of Care (Signed)
Immediate Anesthesia Transfer of Care Note  Patient: Louis [name]  Procedure(s) Performed: OPEN REPAIR OF RIGHT INGUINAL HERNIA WITH MESH (Right Inguinal) INSERTION OF MESH (N/A Inguinal)  Patient Location: PACU  Anesthesia Type:GA combined with regional for post-op pain  Level of Consciousness: awake, alert , oriented and patient cooperative  Airway & Oxygen Therapy: Patient Spontanous Breathing and Patient connected to nasal cannula oxygen  Post-op Assessment: Report given to RN and Post -op Vital signs reviewed and stable  Post vital signs: Reviewed and stable  Last Vitals:  Vitals Value Taken Time  BP    Temp    Pulse 70 03/29/2018  9:53 AM  Resp 17 03/29/2018  9:53 AM  SpO2 94 % 03/29/2018  9:53 AM  Vitals shown include unvalidated device data.  Last Pain:  Vitals:   03/29/18 0619  PainSc: 0-No pain         Complications: No apparent anesthesia complications

## 2018-03-29 NOTE — Anesthesia Procedure Notes (Signed)
Anesthesia Regional Block: TAP block   Pre-Anesthetic Checklist: ,, timeout performed, Correct Patient, Correct Site, Correct Laterality, Correct Procedure, Correct Position, site marked, Risks and benefits discussed,  Surgical consent,  Pre-op evaluation,  At surgeon's request and post-op pain management  Laterality: Right  Prep: chloraprep       Needles:  Injection technique: Single-shot  Needle Type: Echogenic Stimulator Needle     Needle Length: 10cm  Needle Gauge: 21     Additional Needles:   Procedures:,,,, ultrasound used (permanent image in chart),,,,  Narrative:  Start time: 03/29/2018 7:00 AM End time: 03/29/2018 7:10 AM Injection made incrementally with aspirations every 5 mL.  Performed by: Personally  Anesthesiologist: Murvin Natal, MD  Additional Notes: Functioning IV was confirmed and monitors were applied.  A timeout was performed. Sterile prep, hand hygiene and sterile gloves were used. A 115mm 21ga [place] echogenic stimulator needle was used. Negative aspiration and negative test dose prior to incremental administration of local anesthetic. The patient tolerated the procedure well.  Ultrasound guidance: relevent anatomy identified, needle position confirmed, local anesthetic spread visualized around nerve(s), vascular puncture avoided.  Image printed for medical record.

## 2018-03-29 NOTE — H&P (Signed)
  Louis [name] Documented: 10/06/2017 12:04 PM Location: [facility] Patient #: [id number] DOB: February 28, 1946 Married / Language: English / Race: White Male   The patient is a 72 year old male.  Allergies Benjiman Core, CMA; 10/06/2017 12:13 PM) No Known Drug Allergies [05/06/2016]: Allergies Reconciled   Medication History (Armen Eulas Post, CMA; 10/06/2017 12:13 PM) Osteo-Bi-Flex (250-200MG  Tablet, Oral) Active. Chlorpheniramine Maleate (4MG  Tablet, Oral) Active. Labetalol HCl (100MG  Tablet, Oral) Active. AmLODIPine Besylate (10MG  Tablet, Oral as needed) Active. Medications Reconciled  Vitals (Armen Glenn CMA; 10/06/2017 12:13 PM) 10/06/2017 12:12 PM Weight: 235.5 lb Height: 76in Body Surface Area: 2.37 m Body Mass Index: 28.67 kg/m  Temp.: 98.30F  Pulse: 75 (Regular)  BP: 128/84 (Sitting, Left Arm, Standard) BP 166/85 P 70  Physical Exam (Meighan Treto O. Hulen Skains MD; 10/06/2017 12:25 PM) Abdomen Note: REducible RIH. He wants it repaired. Minimally tender.  Hernia is no larger.  Easily reducible.  Site Marked  Assessment & Plan Jeneen Rinks O. Davonne Jarnigan MD; 10/06/2017 12:29 PM) RIGHT INGUINAL HERNIA (K40.90) Impression: Large hernia with reducible. No incarceration.  For open repair today. Because it has the wall of his bladder contained in the hernia I would like to get a Urologist consultation. Will make referral today. Try to get procedure done in the next month or so. Current Plans:  Open repair today with mesh.  Kathryne Eriksson. Dahlia Bailiff, MD, [place] [phone] [facility]

## 2018-03-30 ENCOUNTER — Encounter (HOSPITAL_COMMUNITY): Payer: Self-pay | Admitting: General Surgery

## 2018-03-30 DIAGNOSIS — K4030 Unilateral inguinal hernia, with obstruction, without gangrene, not specified as recurrent: Secondary | ICD-10-CM | POA: Diagnosis not present

## 2018-03-30 DIAGNOSIS — I10 Essential (primary) hypertension: Secondary | ICD-10-CM | POA: Diagnosis not present

## 2018-03-30 DIAGNOSIS — I447 Left bundle-branch block, unspecified: Secondary | ICD-10-CM | POA: Diagnosis not present

## 2018-03-30 DIAGNOSIS — D176 Benign lipomatous neoplasm of spermatic cord: Secondary | ICD-10-CM | POA: Diagnosis not present

## 2018-03-30 DIAGNOSIS — Z85038 Personal history of other malignant neoplasm of large intestine: Secondary | ICD-10-CM | POA: Diagnosis not present

## 2018-03-30 DIAGNOSIS — Z79899 Other long term (current) drug therapy: Secondary | ICD-10-CM | POA: Diagnosis not present

## 2018-03-30 MED ORDER — DOCUSATE SODIUM 100 MG PO CAPS: 100.0000 mg | ORAL_CAPSULE | Freq: Two times a day (BID) | ORAL | 0 refills | Status: DC

## 2018-03-30 MED ORDER — TRAMADOL HCL 50 MG PO TABS: 50.0000 mg | ORAL_TABLET | Freq: Four times a day (QID) | ORAL | 0 refills | Status: DC | PRN

## 2018-03-30 MED ORDER — OXYCODONE HCL 5 MG PO TABS: 5.0000 mg | ORAL_TABLET | ORAL | 0 refills | Status: DC | PRN

## 2018-03-30 NOTE — Discharge Summary (Signed)
Physician Discharge Summary  Patient ID: Louis [name] MRN: [id number] DOB/AGE: 1945/09/06 72 y.o.  Admit date: 03/29/2018 Discharge date: 03/30/2018  Admission Diagnoses:  Discharge Diagnoses:  Active Problems:   S/P right inguinal hernia repair   Discharged Condition: good  Hospital Course: Admitted after complex RIH repair with incarcerated bladder dome, Foley placed postoperatively to decompress the bladder   It was removed at 2300 yesterday evening and the patient was able to void normally afterwards.  His incision is clean and dry without evidence of infection.  Hernia without recurrence  Consults: None  Significant Diagnostic Studies: None  Treatments: antibiotics: Ancef and analgesia: acetaminophen, Dilaudid and tramadol and oxycodone  Discharge Exam: Blood pressure (!) 152/83, pulse 71, temperature 97.9 F (36.6 C), temperature source Oral, resp. rate 18, height 6\' 4"  (1.93 m), weight 110.8 kg, SpO2 95 %. General appearance: alert, cooperative and no distress GI: soft, non-tender; bowel sounds normal; no masses,  no organomegaly Male genitalia: normal, normal findings: normal testes palpated bilaterally, no hernia detected and No bruising as of yet.  Disposition: Discharge disposition: 01-Home or Self Care       Discharge Instructions    Call MD for:  difficulty breathing, headache or visual disturbances   Complete by:  As directed    Call MD for:  extreme fatigue   Complete by:  As directed    Call MD for:  hives   Complete by:  As directed    Call MD for:  persistant dizziness or light-headedness   Complete by:  As directed    Call MD for:  persistant nausea and vomiting   Complete by:  As directed    Call MD for:  redness, tenderness, or signs of infection (pain, swelling, redness, odor or green/yellow discharge around incision site)   Complete by:  As directed    Call MD for:  severe uncontrolled pain   Complete by:  As directed    Call MD for:   temperature >100.4   Complete by:  As directed    Diet - low sodium heart healthy   Complete by:  As directed    Discharge instructions   Complete by:  As directed    See instructions for open hernia repair   Driving Restrictions   Complete by:  As directed    One week   Increase activity slowly   Complete by:  As directed    Leave dressing on - Keep it clean, dry, and intact until clinic visit   Complete by:  As directed    May shower with dressing intact   Lifting restrictions   Complete by:  As directed    Nothing greater than 10 pounds for the next 6 weeks.     Allergies as of 03/30/2018   No Known Allergies     Medication List    TAKE these medications   amLODipine 10 MG tablet Commonly known as:  NORVASC Take 10 mg by mouth daily.   CLARITIN 10 MG tablet Generic drug:  loratadine Take 10 mg by mouth daily.   docusate sodium 100 MG capsule Commonly known as:  COLACE Take 1 capsule (100 mg total) by mouth 2 (two) times daily.   Fish Oil 1200 MG Caps Take 2,400 mg by mouth daily.   labetalol 200 MG tablet Commonly known as:  NORMODYNE Take 200 mg by mouth 3 (three) times daily.   oxyCODONE 5 MG immediate release tablet Commonly known as:  Oxy IR/ROXICODONE Take 1-2  tablets (5-10 mg total) by mouth every 4 (four) hours as needed for moderate pain.   traMADol 50 MG tablet Commonly known as:  ULTRAM Take 1 tablet (50 mg total) by mouth every 6 (six) hours as needed (mild pain).   TYLENOL 8 HOUR ARTHRITIS PAIN 650 MG CR tablet Generic drug:  acetaminophen Take 650 mg by mouth every morning.        Signed: Judeth Horn 03/30/2018, 8:46 AM

## 2018-03-30 NOTE — Discharge Instructions (Signed)
Open Hernia Repair, Adult, Care After These instructions give you information about caring for yourself after your procedure. Your doctor may also give you more specific instructions. If you have problems or questions, contact your doctor. Follow these instructions at home: Surgical cut (incision) care   Follow instructions from your doctor about how to take care of your surgical cut area. Make sure you: ? Wash your hands with soap and water before you change your bandage (dressing). If you cannot use soap and water, use hand sanitizer. ? Change your bandage as told by your doctor. ? Leave stitches (sutures), skin glue, or skin tape (adhesive) strips in place. They may need to stay in place for 2 weeks or longer. If tape strips get loose and curl up, you may trim the loose edges. Do not remove tape strips completely unless your doctor says it is okay.  Check your surgical cut every day for signs of infection. Check for: ? More redness, swelling, or pain. ? More fluid or blood. ? Warmth. ? Pus or a bad smell. ? Leave dressing in place until seen in my office Activity  Do not drive or use heavy machinery while taking prescription pain medicine. Do not drive until your doctor says it is okay.  Until your doctor says it is okay: ? Do not lift anything that is heavier than 10 lb (4.5 kg). ? Do not play contact sports.  Return to your normal activities as told by your doctor. Ask your doctor what activities are safe. General instructions  To prevent or treat having a hard time pooping (constipation) while you are taking prescription pain medicine, your doctor may recommend that you: ? Drink enough fluid to keep your pee (urine) clear or pale yellow. ? Take over-the-counter or prescription medicines. ? Eat foods that are high in fiber, such as fresh fruits and vegetables, whole grains, and beans. ? Limit foods that are high in fat and processed sugars, such as fried and sweet foods.  Take  over-the-counter and prescription medicines only as told by your doctor.  Do not take baths, swim, or use a hot tub until your doctor says it is okay.  Keep all follow-up visits as told by your doctor. This is important. Contact a doctor if:  You develop a rash.  You have more redness, swelling, or pain around your surgical cut.  You have more fluid or blood coming from your surgical cut.  Your surgical cut feels warm to the touch.  You have pus or a bad smell coming from your surgical cut.  You have a fever or chills.  You have blood in your poop (stool).  You have not pooped in 2-3 days.  Medicine does not help your pain. Get help right away if:  You have chest pain or you are short of breath.  You feel light-headed.  You feel weak and dizzy (feel faint).  You have very bad pain.  You throw up (vomit) and your pain is worse. This information is not intended to replace advice given to you by your health care provider. Make sure you discuss any questions you have with your health care provider.  Kathryne Eriksson. Dahlia Bailiff, MD, [place] [phone] [facility]

## 2018-03-31 ENCOUNTER — Ambulatory Visit: Payer: Medicare Other

## 2018-04-19 DIAGNOSIS — N183 Chronic kidney disease, stage 3 (moderate): Secondary | ICD-10-CM | POA: Diagnosis not present

## 2018-04-20 DIAGNOSIS — E785 Hyperlipidemia, unspecified: Secondary | ICD-10-CM | POA: Diagnosis not present

## 2018-04-20 DIAGNOSIS — N183 Chronic kidney disease, stage 3 (moderate): Secondary | ICD-10-CM | POA: Diagnosis not present

## 2018-04-26 DIAGNOSIS — M1711 Unilateral primary osteoarthritis, right knee: Secondary | ICD-10-CM | POA: Diagnosis not present

## 2018-04-26 DIAGNOSIS — M1712 Unilateral primary osteoarthritis, left knee: Secondary | ICD-10-CM | POA: Diagnosis not present

## 2018-05-04 ENCOUNTER — Other Ambulatory Visit: Payer: Medicare Other

## 2018-05-07 ENCOUNTER — Ambulatory Visit: Payer: Medicare Other | Admitting: Oncology

## 2018-05-14 ENCOUNTER — Ambulatory Visit (HOSPITAL_COMMUNITY): Payer: Medicare Other

## 2018-05-17 ENCOUNTER — Inpatient Hospital Stay: Payer: Medicare Other | Attending: Oncology

## 2018-05-17 DIAGNOSIS — N189 Chronic kidney disease, unspecified: Secondary | ICD-10-CM | POA: Insufficient documentation

## 2018-05-17 DIAGNOSIS — I10 Essential (primary) hypertension: Secondary | ICD-10-CM | POA: Diagnosis not present

## 2018-05-17 DIAGNOSIS — Z9221 Personal history of antineoplastic chemotherapy: Secondary | ICD-10-CM | POA: Insufficient documentation

## 2018-05-17 DIAGNOSIS — Z9049 Acquired absence of other specified parts of digestive tract: Secondary | ICD-10-CM | POA: Insufficient documentation

## 2018-05-17 DIAGNOSIS — C189 Malignant neoplasm of colon, unspecified: Secondary | ICD-10-CM

## 2018-05-17 DIAGNOSIS — Z85038 Personal history of other malignant neoplasm of large intestine: Secondary | ICD-10-CM | POA: Diagnosis not present

## 2018-05-17 LAB — CEA (IN HOUSE-CHCC): CEA (CHCC-In House): 1.74 ng/mL (ref 0.00–5.00)

## 2018-05-19 ENCOUNTER — Encounter (HOSPITAL_COMMUNITY): Payer: Self-pay

## 2018-05-19 ENCOUNTER — Ambulatory Visit (HOSPITAL_COMMUNITY)
Admission: RE | Admit: 2018-05-19 | Discharge: 2018-05-19 | Disposition: A | Discharge status: Home / Self Care | Payer: Medicare Other | Source: Ambulatory Visit | Attending: Oncology | Admitting: Oncology

## 2018-05-19 DIAGNOSIS — C189 Malignant neoplasm of colon, unspecified: Secondary | ICD-10-CM | POA: Insufficient documentation

## 2018-05-19 DIAGNOSIS — J181 Lobar pneumonia, unspecified organism: Secondary | ICD-10-CM | POA: Diagnosis not present

## 2018-05-19 DIAGNOSIS — I700 Atherosclerosis of aorta: Secondary | ICD-10-CM | POA: Diagnosis not present

## 2018-05-19 DIAGNOSIS — J189 Pneumonia, unspecified organism: Secondary | ICD-10-CM

## 2018-05-19 DIAGNOSIS — K4090 Unilateral inguinal hernia, without obstruction or gangrene, not specified as recurrent: Secondary | ICD-10-CM | POA: Diagnosis not present

## 2018-05-19 HISTORY — DX: Pneumonia, unspecified organism: J18.9

## 2018-05-20 ENCOUNTER — Other Ambulatory Visit: Payer: Self-pay | Admitting: *Deleted

## 2018-05-20 ENCOUNTER — Inpatient Hospital Stay (HOSPITAL_BASED_OUTPATIENT_CLINIC_OR_DEPARTMENT_OTHER): Payer: Medicare Other | Admitting: Oncology

## 2018-05-20 VITALS — BP 140/77 | HR 66 | Temp 98.1°F | Resp 17 | Ht 76.0 in | Wt 241.4 lb

## 2018-05-20 DIAGNOSIS — Z85038 Personal history of other malignant neoplasm of large intestine: Secondary | ICD-10-CM | POA: Diagnosis not present

## 2018-05-20 DIAGNOSIS — I10 Essential (primary) hypertension: Secondary | ICD-10-CM

## 2018-05-20 DIAGNOSIS — Z9049 Acquired absence of other specified parts of digestive tract: Secondary | ICD-10-CM | POA: Diagnosis not present

## 2018-05-20 DIAGNOSIS — Z9221 Personal history of antineoplastic chemotherapy: Secondary | ICD-10-CM

## 2018-05-20 DIAGNOSIS — C189 Malignant neoplasm of colon, unspecified: Secondary | ICD-10-CM

## 2018-05-20 DIAGNOSIS — N189 Chronic kidney disease, unspecified: Secondary | ICD-10-CM

## 2018-05-20 MED ORDER — LEVOFLOXACIN 250 MG PO TABS: 250.0000 mg | ORAL_TABLET | Freq: Every day | ORAL | 0 refills | Status: DC

## 2018-05-20 NOTE — Progress Notes (Signed)
[facility] OFFICE PROGRESS NOTE   Diagnosis: Colon cancer  INTERVAL HISTORY:   Louis [name] returns as scheduled.  He generally feels well.  He underwent repair of a right inguinal hernia in November.  Good appetite.  No difficulty with bowel or bladder function. Approximately 2 weeks ago he developed a cough and sore throat.  The sore throat has resolved and the cough has improved.  No fever or dyspnea.  He took a few doses of amoxicillin that he had leftover from a previous prescription.  Objective:  Vital signs in last 24 hours:  Blood pressure 140/77, pulse 66, temperature 98.1 F (36.7 C), temperature source Oral, resp. rate 17, height _0  (1.93 m), weight 241 lb 6.4 oz (109.5 kg), SpO2 99 %.    HEENT: Neck without mass Lymphatics: No cervical, supraclavicular, or inguinal nodes.  Prominent bilateral high medial axillary fat pads versus small soft mobile lymph nodes Resp: Lungs clear bilaterally Cardio: Regular rate and rhythm GI: No hepatosplenomegaly, no mass, nontender Vascular: No leg edema   Lab Results:  Lab Results  Component Value Date   WBC 5.5 03/22/2018   HGB 14.0 03/22/2018   HCT 45.5 03/22/2018   MCV 91.5 03/22/2018   PLT 170 03/22/2018   NEUTROABS 3.0 01/07/2018    CMP  Lab Results  Component Value Date   NA 139 03/22/2018   K 4.1 03/22/2018   CL 110 03/22/2018   CO2 23 03/22/2018   GLUCOSE 137 (H) 03/22/2018   BUN 30 (H) 03/22/2018   CREATININE 2.18 (H) 03/22/2018   CALCIUM 9.1 03/22/2018   PROT 7.0 01/08/2017   ALBUMIN 3.9 01/08/2017   AST 20 01/08/2017   ALT 20 01/08/2017   ALKPHOS 65 01/08/2017   BILITOT 0.6 01/08/2017   GFRNONAA 29 (L) 03/22/2018   GFRAA 33 (L) 03/22/2018    Lab Results  Component Value Date   CEA1 1.74 05/17/2018     Imaging:  Ct Abdomen Pelvis Wo Contrast  Result Date: 05/19/2018 CLINICAL DATA:  74 year old male with history of colon cancer. Chemotherapy completed in June 2018. Cough.  EXAM: CT CHEST, ABDOMEN AND PELVIS WITHOUT CONTRAST TECHNIQUE: Multidetector CT imaging of the chest, abdomen and pelvis was performed following the standard protocol without IV contrast. COMPARISON:  CT the chest, abdomen and pelvis 05/12/2017. FINDINGS: CT CHEST FINDINGS Cardiovascular: Heart size is normal. There is no significant pericardial fluid, thickening or pericardial calcification. There is aortic atherosclerosis, as well as atherosclerosis of the great vessels of the mediastinum and the coronary arteries, including calcified atherosclerotic plaque in the left anterior descending, left circumflex and right coronary arteries. Calcifications of the aortic valve. Mediastinum/Nodes: No pathologically enlarged mediastinal or hilar lymph nodes. Please note that accurate exclusion of hilar adenopathy is limited on noncontrast CT scans. Esophagus is unremarkable in appearance. No axillary lymphadenopathy. Lungs/Pleura: New area of airspace consolidation in the posterior aspect of the right upper lobe (axial image 69 of series 4), concerning for pneumonia. No definite suspicious appearing pulmonary nodules or masses are noted. No pleural effusions. Musculoskeletal: There are no aggressive appearing lytic or blastic lesions noted in the visualized portions of the skeleton. CT ABDOMEN PELVIS FINDINGS Hepatobiliary: No definite suspicious cystic or solid hepatic lesions are confidently identified on today's noncontrast CT examination. Unenhanced appearance of the gallbladder is normal. Pancreas: No pancreatic mass or peripancreatic fluid or inflammatory changes noted on today's noncontrast CT examination. Spleen: Unremarkable. Adrenals/Urinary Tract: Low-attenuation lesions in both kidneys, incompletely characterized on today's  noncontrast CT examination, but similar to the prior study and likely to represent cysts, measuring up to 8.8 cm in the lower pole of the left kidney. Bilateral adrenal glands are normal in  appearance. No hydroureteronephrosis. Anterolateral aspect of the urinary bladder on the right continues to extend toward the right inguinal canal, however, there are new postoperative changes of right inguinal hernia repair. Persistent mural thickening in the anterolateral aspect of the urinary bladder on the right without discrete mass confidently identified on today's noncontrast CT examination. Stomach/Bowel: Unenhanced appearance of the stomach is normal. No pathologic dilatation of small bowel or colon. Postoperative changes of partial colectomy in the descending colon. No definite soft tissue mass along the suture line to suggest locally recurrent disease. Normal appendix. Vascular/Lymphatic: Aortic atherosclerosis. No lymphadenopathy noted in the abdomen or pelvis. Reproductive: Prostate gland and seminal vesicles are unremarkable in appearance. Other: Postoperative changes of right inguinal herniorrhaphy noted. No significant volume of ascites. No pneumoperitoneum. Musculoskeletal: There are no aggressive appearing lytic or blastic lesions noted in the visualized portions of the skeleton. IMPRESSION: 1. New area of airspace consolidation in the posterior aspect of the right upper lobe, concerning for pneumonia. 2. No definite findings to suggest metastatic disease in the chest, abdomen or pelvis. 3. Status post partial colectomy in the descending colon with no findings to suggest locally recurrent disease. 4. Status post right inguinal herniorrhaphy. This has resolved the previously noted right inguinocrotal hernia which contained a portion of the urinary bladder. The anterolateral aspect of the urinary bladder is intimately associated with the surgical bed, with some mural thickening in this region, likely to reflect some postoperative adhesions and chronic scarring. 5. Aortic atherosclerosis, in addition to 3 vessel coronary artery disease. Assessment for potential risk factor modification, dietary  therapy or pharmacologic therapy may be warranted, if clinically indicated. 6. There are calcifications of the aortic valve. Echocardiographic correlation for evaluation of potential valvular dysfunction may be warranted if clinically indicated. 7. Additional incidental findings, as above. Electronically Signed   By: Vinnie Langton M.D.   On: 05/19/2018 14:53   Ct Chest Wo Contrast  Result Date: 05/19/2018 CLINICAL DATA:  73 year old male with history of colon cancer. Chemotherapy completed in June 2018. Cough. EXAM: CT CHEST, ABDOMEN AND PELVIS WITHOUT CONTRAST TECHNIQUE: Multidetector CT imaging of the chest, abdomen and pelvis was performed following the standard protocol without IV contrast. COMPARISON:  CT the chest, abdomen and pelvis 05/12/2017. FINDINGS: CT CHEST FINDINGS Cardiovascular: Heart size is normal. There is no significant pericardial fluid, thickening or pericardial calcification. There is aortic atherosclerosis, as well as atherosclerosis of the great vessels of the mediastinum and the coronary arteries, including calcified atherosclerotic plaque in the left anterior descending, left circumflex and right coronary arteries. Calcifications of the aortic valve. Mediastinum/Nodes: No pathologically enlarged mediastinal or hilar lymph nodes. Please note that accurate exclusion of hilar adenopathy is limited on noncontrast CT scans. Esophagus is unremarkable in appearance. No axillary lymphadenopathy. Lungs/Pleura: New area of airspace consolidation in the posterior aspect of the right upper lobe (axial image 69 of series 4), concerning for pneumonia. No definite suspicious appearing pulmonary nodules or masses are noted. No pleural effusions. Musculoskeletal: There are no aggressive appearing lytic or blastic lesions noted in the visualized portions of the skeleton. CT ABDOMEN PELVIS FINDINGS Hepatobiliary: No definite suspicious cystic or solid hepatic lesions are confidently identified on  today's noncontrast CT examination. Unenhanced appearance of the gallbladder is normal. Pancreas: No pancreatic mass or peripancreatic fluid  or inflammatory changes noted on today's noncontrast CT examination. Spleen: Unremarkable. Adrenals/Urinary Tract: Low-attenuation lesions in both kidneys, incompletely characterized on today's noncontrast CT examination, but similar to the prior study and likely to represent cysts, measuring up to 8.8 cm in the lower pole of the left kidney. Bilateral adrenal glands are normal in appearance. No hydroureteronephrosis. Anterolateral aspect of the urinary bladder on the right continues to extend toward the right inguinal canal, however, there are new postoperative changes of right inguinal hernia repair. Persistent mural thickening in the anterolateral aspect of the urinary bladder on the right without discrete mass confidently identified on today's noncontrast CT examination. Stomach/Bowel: Unenhanced appearance of the stomach is normal. No pathologic dilatation of small bowel or colon. Postoperative changes of partial colectomy in the descending colon. No definite soft tissue mass along the suture line to suggest locally recurrent disease. Normal appendix. Vascular/Lymphatic: Aortic atherosclerosis. No lymphadenopathy noted in the abdomen or pelvis. Reproductive: Prostate gland and seminal vesicles are unremarkable in appearance. Other: Postoperative changes of right inguinal herniorrhaphy noted. No significant volume of ascites. No pneumoperitoneum. Musculoskeletal: There are no aggressive appearing lytic or blastic lesions noted in the visualized portions of the skeleton. IMPRESSION: 1. New area of airspace consolidation in the posterior aspect of the right upper lobe, concerning for pneumonia. 2. No definite findings to suggest metastatic disease in the chest, abdomen or pelvis. 3. Status post partial colectomy in the descending colon with no findings to suggest locally  recurrent disease. 4. Status post right inguinal herniorrhaphy. This has resolved the previously noted right inguinocrotal hernia which contained a portion of the urinary bladder. The anterolateral aspect of the urinary bladder is intimately associated with the surgical bed, with some mural thickening in this region, likely to reflect some postoperative adhesions and chronic scarring. 5. Aortic atherosclerosis, in addition to 3 vessel coronary artery disease. Assessment for potential risk factor modification, dietary therapy or pharmacologic therapy may be warranted, if clinically indicated. 6. There are calcifications of the aortic valve. Echocardiographic correlation for evaluation of potential valvular dysfunction may be warranted if clinically indicated. 7. Additional incidental findings, as above. Electronically Signed   By: Vinnie Langton M.D.   On: 05/19/2018 14:53    Medications: I have reviewed the patient's current medications.   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 Sigmoid/descending resection and creation of a descending colostomy 04/10/2016 ? MSI-stable, no loss of mismatch repair protein expression ? Foundation 1-BRAF V600Epositive, MS-stable, intermediate tumor mutation burden, no RAS mutation ? CT abdomen/pelvis 04/02/2016-no evidence of distant metastatic disease ? Cycle 1 FOLFOX 05/21/2016 ? Cycle 2 FOLFOX 06/04/2016 ? Cycle 3 FOLFOX 06/18/2016 ? Cycle 4 FOLFOX 07/02/2016 (oxaliplatin held secondary to thrombocytopenia) ? Cycle 5 FOLFOX 07/16/2016 ? Cycle 6 FOLFOX 07/30/2016 ? Cycle 7 FOLFOX 08/13/2016 (oxaliplatin held and 5-FU dose reduced) ? Cycle 8 FOLFOX 09/03/2016 (oxaliplatin held secondary to neuropathy) ? Cycle 9 FOLFOX 09/17/2016 (oxaliplatin held secondary to neuropathy) ? Cycle 10 FOLFOX 10/02/2016 ? Cycle 11 FOLFOX 10/15/2016 (oxaliplatin eliminated from the  regimen) ? Cycle 12 FOLFOX 10/30/2016 (oxaliplatin eliminated) ? CTs 05/12/2017-no evidence of recurrent disease, right inguinal hernia containing bladder ? Colonoscopy 07/21/2017, 3 polyps were removed from the descending and transverse colon, fragments of tubular and tubulovillous adenoma ? CTs 05/19/2018-no evidence of recurrent disease, status post hernia repair, right upper lobe pneumonia  2. Chronic renal insufficiency  3. Hypertension  4. Inflamed sebaceous cyst at the upper back-status post incision  and drainage  5. Thrombocytopenia secondary chemotherapy-oxaliplatin dose reduced beginning with cycle 3 FOLFOX, oxaliplatin held with cycle 4 and cycle 7 FOLFOX  6. Oxaliplatin neuropathy-improved  7. History ofMucositis secondary chemotherapy  8.  Symptoms of pneumonia January 2020-CT 05/19/2018 consistent with right upper lobe pneumonia, Levaquin prescribed 05/20/2018    Disposition: Louis [name] remains in clinical remission from colon cancer.  He will return for an office visit and CEA in 6 months.  We will consider 1 more CT at a one-year interval.  He had symptoms of pneumonia a few weeks ago and the CT yesterday reveals right lung consolidation.  He will complete a course of Levaquin.  He will seek medical attention for a fever, cough, or dyspnea.  I recommended he treated with both of the pneumococcal vaccines.  25 minutes were spent with the patient today.  The majority of the time was used for counseling and coordination of care.  Betsy Coder, MD  05/20/2018  11:17 AM

## 2018-05-20 NOTE — Patient Instructions (Signed)
Please provide a copy of your Advanced Directive/Living Will at next visit to have scanned into your record.

## 2018-05-21 ENCOUNTER — Telehealth: Payer: Self-pay | Admitting: Oncology

## 2018-05-21 NOTE — Telephone Encounter (Signed)
F/u in 6 months per 1/16 los - reminder letter sent in the mail with appt date and time

## 2018-05-28 DIAGNOSIS — E669 Obesity, unspecified: Secondary | ICD-10-CM | POA: Diagnosis not present

## 2018-05-28 DIAGNOSIS — C187 Malignant neoplasm of sigmoid colon: Secondary | ICD-10-CM | POA: Diagnosis not present

## 2018-05-28 DIAGNOSIS — N183 Chronic kidney disease, stage 3 (moderate): Secondary | ICD-10-CM | POA: Diagnosis not present

## 2018-05-28 DIAGNOSIS — E785 Hyperlipidemia, unspecified: Secondary | ICD-10-CM | POA: Diagnosis not present

## 2018-05-28 DIAGNOSIS — N179 Acute kidney failure, unspecified: Secondary | ICD-10-CM | POA: Diagnosis not present

## 2018-05-28 DIAGNOSIS — N2581 Secondary hyperparathyroidism of renal origin: Secondary | ICD-10-CM | POA: Diagnosis not present

## 2018-05-28 DIAGNOSIS — I129 Hypertensive chronic kidney disease with stage 1 through stage 4 chronic kidney disease, or unspecified chronic kidney disease: Secondary | ICD-10-CM | POA: Diagnosis not present

## 2018-06-16 DIAGNOSIS — D2261 Melanocytic nevi of right upper limb, including shoulder: Secondary | ICD-10-CM | POA: Diagnosis not present

## 2018-06-16 DIAGNOSIS — C44712 Basal cell carcinoma of skin of right lower limb, including hip: Secondary | ICD-10-CM | POA: Diagnosis not present

## 2018-06-16 DIAGNOSIS — L821 Other seborrheic keratosis: Secondary | ICD-10-CM | POA: Diagnosis not present

## 2018-06-16 DIAGNOSIS — D485 Neoplasm of uncertain behavior of skin: Secondary | ICD-10-CM | POA: Diagnosis not present

## 2018-06-16 DIAGNOSIS — D2272 Melanocytic nevi of left lower limb, including hip: Secondary | ICD-10-CM | POA: Diagnosis not present

## 2018-06-16 DIAGNOSIS — L565 Disseminated superficial actinic porokeratosis (DSAP): Secondary | ICD-10-CM | POA: Diagnosis not present

## 2018-06-16 DIAGNOSIS — L720 Epidermal cyst: Secondary | ICD-10-CM | POA: Diagnosis not present

## 2018-06-16 DIAGNOSIS — D225 Melanocytic nevi of trunk: Secondary | ICD-10-CM | POA: Diagnosis not present

## 2018-06-16 DIAGNOSIS — L905 Scar conditions and fibrosis of skin: Secondary | ICD-10-CM | POA: Diagnosis not present

## 2018-06-16 DIAGNOSIS — D2262 Melanocytic nevi of left upper limb, including shoulder: Secondary | ICD-10-CM | POA: Diagnosis not present

## 2018-06-30 DIAGNOSIS — M1712 Unilateral primary osteoarthritis, left knee: Secondary | ICD-10-CM | POA: Diagnosis not present

## 2018-06-30 DIAGNOSIS — M1711 Unilateral primary osteoarthritis, right knee: Secondary | ICD-10-CM | POA: Diagnosis not present

## 2018-06-30 DIAGNOSIS — M25561 Pain in right knee: Secondary | ICD-10-CM | POA: Diagnosis not present

## 2018-06-30 DIAGNOSIS — M25562 Pain in left knee: Secondary | ICD-10-CM | POA: Diagnosis not present

## 2018-07-01 ENCOUNTER — Telehealth: Payer: Self-pay | Admitting: *Deleted

## 2018-07-01 NOTE — Telephone Encounter (Signed)
   [facility] Pre-operative Risk Assessment    Request for surgical clearance:  1. What type of surgery is being performed? RIGHT TOTAL KNEE   2. When is this surgery scheduled? 08/24/18   3. What type of clearance is required (medical clearance vs. Pharmacy clearance to hold med vs. Both)? MEDICAL  4. Are there any medications that need to be held prior to surgery and how long?SURGEON ASKS IF ON ANY ANTIGOAG TO HOLD 5-7 DAYS PRIOR. I DO NOT SEE ANY LISTED   5. Practice name and name of physician performing surgery? [facility]; DR. Alvan Dame   6. What is your office phone number [phone]    7.   What is your office fax number [phone]  8.   Anesthesia type (None, local, MAC, general) ? SPINAL    Julaine Hua 07/01/2018, 11:56 AM  _________________________________________________________________   (provider comments below)

## 2018-07-01 NOTE — Telephone Encounter (Signed)
  Last seen by Dr. Geraldo Pitter 01/12/18 for preop eval prior to hernia surgery. He had a NST for new LBBB that was low risk and negative for ischemia. Echo showed normal LVEF No significant valvular abnormalities.  Given time of last OV, pt will need a phone call to determine clearance. Attempt made today. No answer. LVM to call back.

## 2018-07-05 NOTE — Telephone Encounter (Signed)
   Primary Cardiologist: No primary care provider on file.  Chart reviewed as part of pre-operative protocol coverage. Patient was contacted 07/05/2018 in reference to pre-operative risk assessment for pending surgery as outlined below.  Louis [name] was last seen on 01/12/2018 by Dr. Geraldo Pitter.  Since that day, Louis [name] has done well with no new cardiac complaints. He had a nuclear stress test for new LBBB that was low risk and negative for ischemia. Echo showed normal LVEF with No significant valvular abnormalities.  Therefore, based on ACC/AHA guidelines, the patient would be at acceptable risk for the planned procedure without further cardiovascular testing.   He is not currently on any anticoagulation.   I will route this recommendation to the requesting party via Epic fax function and remove from pre-op pool.  Please call with questions.  Daune Perch, NP 07/05/2018, 10:30 AM

## 2018-07-12 DIAGNOSIS — D485 Neoplasm of uncertain behavior of skin: Secondary | ICD-10-CM | POA: Diagnosis not present

## 2018-07-12 DIAGNOSIS — L988 Other specified disorders of the skin and subcutaneous tissue: Secondary | ICD-10-CM | POA: Diagnosis not present

## 2018-07-20 DIAGNOSIS — M25561 Pain in right knee: Secondary | ICD-10-CM | POA: Diagnosis not present

## 2018-07-23 ENCOUNTER — Encounter: Payer: Self-pay | Admitting: Gastroenterology

## 2018-08-24 ENCOUNTER — Ambulatory Visit: Admit: 2018-08-24 | Payer: Medicare Other | Admitting: Orthopedic Surgery

## 2018-08-24 SURGERY — ARTHROPLASTY, KNEE, TOTAL
Anesthesia: Spinal | Laterality: Right

## 2018-09-22 DIAGNOSIS — M1712 Unilateral primary osteoarthritis, left knee: Secondary | ICD-10-CM | POA: Diagnosis not present

## 2018-09-22 DIAGNOSIS — M170 Bilateral primary osteoarthritis of knee: Secondary | ICD-10-CM | POA: Diagnosis not present

## 2018-09-22 DIAGNOSIS — M25561 Pain in right knee: Secondary | ICD-10-CM | POA: Diagnosis not present

## 2018-09-22 DIAGNOSIS — M1711 Unilateral primary osteoarthritis, right knee: Secondary | ICD-10-CM | POA: Diagnosis not present

## 2018-09-22 DIAGNOSIS — M25562 Pain in left knee: Secondary | ICD-10-CM | POA: Diagnosis not present

## 2018-10-15 DIAGNOSIS — Z03818 Encounter for observation for suspected exposure to other biological agents ruled out: Secondary | ICD-10-CM | POA: Diagnosis not present

## 2018-10-22 DIAGNOSIS — N183 Chronic kidney disease, stage 3 (moderate): Secondary | ICD-10-CM | POA: Diagnosis not present

## 2018-10-22 DIAGNOSIS — E669 Obesity, unspecified: Secondary | ICD-10-CM | POA: Diagnosis not present

## 2018-10-22 DIAGNOSIS — E785 Hyperlipidemia, unspecified: Secondary | ICD-10-CM | POA: Diagnosis not present

## 2018-10-22 DIAGNOSIS — N2581 Secondary hyperparathyroidism of renal origin: Secondary | ICD-10-CM | POA: Diagnosis not present

## 2018-10-22 DIAGNOSIS — N179 Acute kidney failure, unspecified: Secondary | ICD-10-CM | POA: Diagnosis not present

## 2018-10-22 DIAGNOSIS — I129 Hypertensive chronic kidney disease with stage 1 through stage 4 chronic kidney disease, or unspecified chronic kidney disease: Secondary | ICD-10-CM | POA: Diagnosis not present

## 2018-10-22 DIAGNOSIS — C187 Malignant neoplasm of sigmoid colon: Secondary | ICD-10-CM | POA: Diagnosis not present

## 2018-11-19 ENCOUNTER — Inpatient Hospital Stay: Payer: Medicare Other

## 2018-11-19 ENCOUNTER — Other Ambulatory Visit: Payer: Self-pay

## 2018-11-19 ENCOUNTER — Inpatient Hospital Stay: Payer: Medicare Other | Attending: Oncology | Admitting: Oncology

## 2018-11-19 ENCOUNTER — Telehealth: Payer: Self-pay | Admitting: *Deleted

## 2018-11-19 VITALS — BP 160/81 | HR 61 | Temp 99.1°F | Resp 18 | Ht 76.0 in | Wt 247.5 lb

## 2018-11-19 DIAGNOSIS — Z85038 Personal history of other malignant neoplasm of large intestine: Secondary | ICD-10-CM | POA: Diagnosis not present

## 2018-11-19 DIAGNOSIS — C189 Malignant neoplasm of colon, unspecified: Secondary | ICD-10-CM

## 2018-11-19 DIAGNOSIS — N289 Disorder of kidney and ureter, unspecified: Secondary | ICD-10-CM | POA: Diagnosis not present

## 2018-11-19 DIAGNOSIS — I10 Essential (primary) hypertension: Secondary | ICD-10-CM | POA: Diagnosis not present

## 2018-11-19 DIAGNOSIS — Z933 Colostomy status: Secondary | ICD-10-CM | POA: Insufficient documentation

## 2018-11-19 DIAGNOSIS — D696 Thrombocytopenia, unspecified: Secondary | ICD-10-CM | POA: Insufficient documentation

## 2018-11-19 LAB — CEA (IN HOUSE-CHCC): CEA (CHCC-In House): 1.49 ng/mL (ref 0.00–5.00)

## 2018-11-19 NOTE — Telephone Encounter (Signed)
-----   Message from Ladell Pier, MD sent at 11/19/2018 12:53 PM EDT ----- Please call patient, CEA is normal, follow-up scheduled

## 2018-11-19 NOTE — Progress Notes (Signed)
  [place] OFFICE PROGRESS NOTE   Diagnosis: Colon cancer  INTERVAL HISTORY:   Louis [name] returns as scheduled.  He feels well.  He completed a course of antibiotics for pneumonia in January.  He is followed by nephrology for chronic renal failure.  Objective:  Vital signs in last 24 hours:  Blood pressure (!) 160/81, pulse 61, temperature 99.1 F (37.3 C), temperature source Oral, resp. rate 18, height '6\' 4"'$  (1.93 m), weight 247 lb 8 oz (112.3 kg), SpO2 98 %.    HEENT: Neck without mass Lymphatics: No cervical, supraclavicular, axillary, or inguinal nodes.  Prominent bilateral axillary fat pads with soft mobile tissue at the medial axilla/upper outer breast line bilaterally, no discrete mass GI: No hepatosplenomegaly, no mass, nontender Vascular: No leg edema   Lab Results:   Lab Results  Component Value Date   CEA1 1.49 11/19/2018    Medications: I have reviewed the patient's current medications.   Assessment/Plan: 1.Sigmoid colon cancer, stage IV (J3H,L4T,G2B), isolated mesenteric implant-resected, multiple tumor deposits ? Sigmoid/descending resection and creation of a descending colostomy 04/10/2016 ? MSI-stable, no loss of mismatch repair protein expression ? Foundation 1-BRAF V600Epositive, MS-stable, intermediate tumor mutation burden, no RAS mutation ? CT abdomen/pelvis 04/02/2016-no evidence of distant metastatic disease ? Cycle 1 FOLFOX 05/21/2016 ? Cycle 2 FOLFOX 06/04/2016 ? Cycle 3 FOLFOX 06/18/2016 ? Cycle 4 FOLFOX 07/02/2016 (oxaliplatin held secondary to thrombocytopenia) ? Cycle 5 FOLFOX 07/16/2016 ? Cycle 6 FOLFOX 07/30/2016 ? Cycle 7 FOLFOX 08/13/2016 (oxaliplatin held and 5-FU dose reduced) ? Cycle 8 FOLFOX 09/03/2016 (oxaliplatin held secondary to neuropathy) ? Cycle 9 FOLFOX 09/17/2016 (oxaliplatin held secondary to neuropathy) ? Cycle 10 FOLFOX 10/02/2016 ? Cycle 11 FOLFOX 10/15/2016 (oxaliplatin eliminated from the  regimen) ? Cycle 12 FOLFOX 10/30/2016 (oxaliplatin eliminated) ? CTs 05/12/2017-no evidence of recurrent disease, right inguinal hernia containing bladder ? Colonoscopy 07/21/2017, 3 polyps were removed from the descending and transverse colon, fragments of tubular and tubulovillous adenoma ? CTs 05/19/2018-no evidence of recurrent disease, status post hernia repair, right upper lobe pneumonia  2. Chronic renal insufficiency  3. Hypertension  4. Inflamed sebaceous cyst at the upper back-status post incision and drainage  5. Thrombocytopenia secondary chemotherapy-oxaliplatin dose reduced beginning with cycle 3 FOLFOX, oxaliplatin held with cycle 4 and cycle 7 FOLFOX  6. Oxaliplatin neuropathy-improved  7. History ofMucositis secondary chemotherapy  8.  Symptoms of pneumonia January 2020-CT 05/19/2018 consistent with right upper lobe pneumonia, Levaquin prescribed 05/20/2018     Disposition: Louis [name] is in remission from colon cancer.  He will return for an office visit and restaging CT evaluation in 6 months.  Betsy Coder, MD  11/19/2018  12:51 PM

## 2018-11-19 NOTE — Telephone Encounter (Signed)
Notified of normal CEA and to f/u as scheduled.  

## 2018-11-22 ENCOUNTER — Telehealth: Payer: Self-pay | Admitting: Oncology

## 2018-11-22 NOTE — Telephone Encounter (Signed)
Called and left msg. Mailed printout  °

## 2019-01-11 DIAGNOSIS — Z23 Encounter for immunization: Secondary | ICD-10-CM | POA: Diagnosis not present

## 2019-02-08 DIAGNOSIS — N183 Chronic kidney disease, stage 3 unspecified: Secondary | ICD-10-CM | POA: Diagnosis not present

## 2019-02-08 DIAGNOSIS — N1832 Chronic kidney disease, stage 3b: Secondary | ICD-10-CM | POA: Diagnosis not present

## 2019-02-08 DIAGNOSIS — E785 Hyperlipidemia, unspecified: Secondary | ICD-10-CM | POA: Diagnosis not present

## 2019-02-09 DIAGNOSIS — Z23 Encounter for immunization: Secondary | ICD-10-CM | POA: Diagnosis not present

## 2019-02-15 DIAGNOSIS — C187 Malignant neoplasm of sigmoid colon: Secondary | ICD-10-CM | POA: Diagnosis not present

## 2019-02-15 DIAGNOSIS — E785 Hyperlipidemia, unspecified: Secondary | ICD-10-CM | POA: Diagnosis not present

## 2019-02-15 DIAGNOSIS — N2581 Secondary hyperparathyroidism of renal origin: Secondary | ICD-10-CM | POA: Diagnosis not present

## 2019-02-15 DIAGNOSIS — I129 Hypertensive chronic kidney disease with stage 1 through stage 4 chronic kidney disease, or unspecified chronic kidney disease: Secondary | ICD-10-CM | POA: Diagnosis not present

## 2019-02-15 DIAGNOSIS — N1832 Chronic kidney disease, stage 3b: Secondary | ICD-10-CM | POA: Diagnosis not present

## 2019-02-15 DIAGNOSIS — E669 Obesity, unspecified: Secondary | ICD-10-CM | POA: Diagnosis not present

## 2019-02-15 DIAGNOSIS — N179 Acute kidney failure, unspecified: Secondary | ICD-10-CM | POA: Diagnosis not present

## 2019-03-01 DIAGNOSIS — N1832 Chronic kidney disease, stage 3b: Secondary | ICD-10-CM | POA: Diagnosis not present

## 2019-03-01 DIAGNOSIS — I10 Essential (primary) hypertension: Secondary | ICD-10-CM | POA: Diagnosis not present

## 2019-05-20 ENCOUNTER — Ambulatory Visit (HOSPITAL_COMMUNITY)
Admission: RE | Admit: 2019-05-20 | Discharge: 2019-05-20 | Disposition: A | Discharge status: Home / Self Care | Payer: Medicare Other | Source: Ambulatory Visit | Attending: Oncology | Admitting: Oncology

## 2019-05-20 ENCOUNTER — Other Ambulatory Visit: Payer: Self-pay

## 2019-05-20 ENCOUNTER — Inpatient Hospital Stay: Payer: Medicare Other | Attending: Oncology

## 2019-05-20 DIAGNOSIS — C189 Malignant neoplasm of colon, unspecified: Secondary | ICD-10-CM

## 2019-05-20 DIAGNOSIS — N189 Chronic kidney disease, unspecified: Secondary | ICD-10-CM | POA: Insufficient documentation

## 2019-05-20 DIAGNOSIS — D6959 Other secondary thrombocytopenia: Secondary | ICD-10-CM | POA: Diagnosis not present

## 2019-05-20 DIAGNOSIS — N2889 Other specified disorders of kidney and ureter: Secondary | ICD-10-CM | POA: Diagnosis not present

## 2019-05-20 DIAGNOSIS — I2510 Atherosclerotic heart disease of native coronary artery without angina pectoris: Secondary | ICD-10-CM | POA: Diagnosis not present

## 2019-05-20 DIAGNOSIS — I700 Atherosclerosis of aorta: Secondary | ICD-10-CM | POA: Diagnosis not present

## 2019-05-20 DIAGNOSIS — I129 Hypertensive chronic kidney disease with stage 1 through stage 4 chronic kidney disease, or unspecified chronic kidney disease: Secondary | ICD-10-CM | POA: Diagnosis not present

## 2019-05-20 DIAGNOSIS — Z85038 Personal history of other malignant neoplasm of large intestine: Secondary | ICD-10-CM | POA: Diagnosis not present

## 2019-05-20 DIAGNOSIS — G620 Drug-induced polyneuropathy: Secondary | ICD-10-CM | POA: Insufficient documentation

## 2019-05-20 DIAGNOSIS — I288 Other diseases of pulmonary vessels: Secondary | ICD-10-CM | POA: Diagnosis not present

## 2019-05-20 DIAGNOSIS — I77810 Thoracic aortic ectasia: Secondary | ICD-10-CM | POA: Diagnosis not present

## 2019-05-20 LAB — CEA (IN HOUSE-CHCC): CEA (CHCC-In House): 1.49 ng/mL (ref 0.00–5.00)

## 2019-05-23 ENCOUNTER — Encounter: Payer: Self-pay | Admitting: Oncology

## 2019-05-23 ENCOUNTER — Other Ambulatory Visit: Payer: Self-pay

## 2019-05-23 ENCOUNTER — Inpatient Hospital Stay (HOSPITAL_BASED_OUTPATIENT_CLINIC_OR_DEPARTMENT_OTHER): Payer: Medicare Other | Admitting: Oncology

## 2019-05-23 VITALS — BP 149/77 | HR 61 | Temp 98.2°F | Resp 16 | Ht 76.0 in | Wt 246.8 lb

## 2019-05-23 DIAGNOSIS — D6959 Other secondary thrombocytopenia: Secondary | ICD-10-CM | POA: Diagnosis not present

## 2019-05-23 DIAGNOSIS — I129 Hypertensive chronic kidney disease with stage 1 through stage 4 chronic kidney disease, or unspecified chronic kidney disease: Secondary | ICD-10-CM | POA: Diagnosis not present

## 2019-05-23 DIAGNOSIS — Z85038 Personal history of other malignant neoplasm of large intestine: Secondary | ICD-10-CM | POA: Diagnosis not present

## 2019-05-23 DIAGNOSIS — C189 Malignant neoplasm of colon, unspecified: Secondary | ICD-10-CM | POA: Diagnosis not present

## 2019-05-23 DIAGNOSIS — N189 Chronic kidney disease, unspecified: Secondary | ICD-10-CM | POA: Diagnosis not present

## 2019-05-23 DIAGNOSIS — G620 Drug-induced polyneuropathy: Secondary | ICD-10-CM | POA: Diagnosis not present

## 2019-05-23 NOTE — Progress Notes (Signed)
[place] OFFICE PROGRESS NOTE   Diagnosis: Colon cancer  INTERVAL HISTORY:   Louis [name] for a scheduled visit.  Good appetite.  He is working part-time.  He has sinus drainage and a cough that he relates to allergies.  Objective:  Vital signs in last 24 hours:  Blood pressure (!) 149/77, pulse 61, temperature 98.2 F (36.8 C), temperature source Temporal, resp. rate 16, height '6\' 4"'  (1.93 m), weight 246 lb 12.8 oz (111.9 kg), SpO2 100 %.   Limited physical examination secondary to distancing with the Covid pandemic Lymphatics: No cervical, supraclavicular, axillary, or inguinal nodes GI: No hepatosplenomegaly, no mass, nontender Vascular: No leg edema   Lab Results:  Lab Results  Component Value Date   WBC 5.5 03/22/2018   HGB 14.0 03/22/2018   HCT 45.5 03/22/2018   MCV 91.5 03/22/2018   PLT 170 03/22/2018   NEUTROABS 3.0 01/07/2018    CMP  Lab Results  Component Value Date   NA 139 03/22/2018   K 4.1 03/22/2018   CL 110 03/22/2018   CO2 23 03/22/2018   GLUCOSE 137 (H) 03/22/2018   BUN 30 (H) 03/22/2018   CREATININE 2.18 (H) 03/22/2018   CALCIUM 9.1 03/22/2018   PROT 7.0 01/08/2017   ALBUMIN 3.9 01/08/2017   AST 20 01/08/2017   ALT 20 01/08/2017   ALKPHOS 65 01/08/2017   BILITOT 0.6 01/08/2017   GFRNONAA 29 (L) 03/22/2018   GFRAA 33 (L) 03/22/2018    Lab Results  Component Value Date   CEA1 1.49 05/20/2019      Imaging:  CT ABDOMEN PELVIS WO CONTRAST  Result Date: 05/20/2019 CLINICAL DATA:  Colon cancer diagnosed 12/17. Colon resection. Colostomy reversal. Chemotherapy complete 2018. Yearly follow-up. No complaints. EXAM: CT CHEST, ABDOMEN AND PELVIS WITHOUT CONTRAST TECHNIQUE: Multidetector CT imaging of the chest, abdomen and pelvis was performed following the standard protocol without IV contrast. COMPARISON:  05/19/2018 FINDINGS: CT CHEST FINDINGS Cardiovascular: Aortic atherosclerosis. Tortuous thoracic aorta. Ascending aortic  dilatation, including at 4.1 cm on 31/3, similar. Mild cardiomegaly, without pericardial effusion. Multivessel coronary artery atherosclerosis. Pulmonary artery enlargement, outflow tract 3.2 cm Mediastinum/Nodes: No supraclavicular adenopathy. No mediastinal or definite hilar adenopathy, given limitations of unenhanced CT. Lungs/Pleura: No pleural fluid. Calcified granuloma in the inferior lingula on 90/7. Resolved right upper lobe pneumonia. Musculoskeletal: Right greater than left mild gynecomastia. No acute osseous abnormality. CT ABDOMEN PELVIS FINDINGS Hepatobiliary: Normal liver. Normal gallbladder, without biliary ductal dilatation. Pancreas: Fatty replacement within the pancreatic head and uncinate process. Spleen: Normal in size, without focal abnormality. Adrenals/Urinary Tract: Normal adrenal glands. Mild renal cortical thinning bilaterally. Left larger than right low-density renal lesions are similar in size and likely cysts. There is also an interpolar left renal too small to characterize lesion. No hydronephrosis. Similar atypical bladder morphology, with the right-side of the bladder extending towards the right inguinal canal, likely due to postoperative scarring. Stomach/Bowel: Normal stomach, without wall thickening. Surgical sutures within the descending colon. Normal terminal ileum and appendix. Normal small bowel. Vascular/Lymphatic: Aortic and branch vessel atherosclerosis. No abdominopelvic adenopathy. Reproductive: Mild prostatomegaly. Other: No significant free fluid. No evidence of omental or peritoneal disease. Right inguinal hernia repair. Anterior left pelvic wall minimal laxity, presumably at the site of prior ostomy. Musculoskeletal: Thoracolumbar spondylosis. IMPRESSION: 1. No acute process or evidence of metastatic disease within the chest, abdomen, or pelvis. 2. Mild limitations secondary to noncontrast technique. 3. Coronary artery atherosclerosis. Aortic Atherosclerosis  (ICD10-I70.0). 4. Mild ascending aortic dilatation  at 4.1 cm, similar. Recommend annual imaging followup by CTA or MRA. This recommendation follows 2010 ACCF/AHA/AATS/ACR/ASA/SCA/SCAI/SIR/STS/SVM Guidelines for the Diagnosis and Management of Patients with Thoracic Aortic Disease. Circulation. 2010; 121: J628-B151. Aortic aneurysm NOS (ICD10-I71.9) 5. Pulmonary artery enlargement suggests pulmonary arterial hypertension. 6. Similar appearance of the urinary bladder, with extension towards the entrance of the right inguinal canal, likely due to postoperative scarring. 7. Gynecomastia. Electronically Signed   By: Abigail Miyamoto M.D.   On: 05/20/2019 12:05   CT CHEST WO CONTRAST  Result Date: 05/20/2019 CLINICAL DATA:  Colon cancer diagnosed 12/17. Colon resection. Colostomy reversal. Chemotherapy complete 2018. Yearly follow-up. No complaints. EXAM: CT CHEST, ABDOMEN AND PELVIS WITHOUT CONTRAST TECHNIQUE: Multidetector CT imaging of the chest, abdomen and pelvis was performed following the standard protocol without IV contrast. COMPARISON:  05/19/2018 FINDINGS: CT CHEST FINDINGS Cardiovascular: Aortic atherosclerosis. Tortuous thoracic aorta. Ascending aortic dilatation, including at 4.1 cm on 31/3, similar. Mild cardiomegaly, without pericardial effusion. Multivessel coronary artery atherosclerosis. Pulmonary artery enlargement, outflow tract 3.2 cm Mediastinum/Nodes: No supraclavicular adenopathy. No mediastinal or definite hilar adenopathy, given limitations of unenhanced CT. Lungs/Pleura: No pleural fluid. Calcified granuloma in the inferior lingula on 90/7. Resolved right upper lobe pneumonia. Musculoskeletal: Right greater than left mild gynecomastia. No acute osseous abnormality. CT ABDOMEN PELVIS FINDINGS Hepatobiliary: Normal liver. Normal gallbladder, without biliary ductal dilatation. Pancreas: Fatty replacement within the pancreatic head and uncinate process. Spleen: Normal in size, without focal  abnormality. Adrenals/Urinary Tract: Normal adrenal glands. Mild renal cortical thinning bilaterally. Left larger than right low-density renal lesions are similar in size and likely cysts. There is also an interpolar left renal too small to characterize lesion. No hydronephrosis. Similar atypical bladder morphology, with the right-side of the bladder extending towards the right inguinal canal, likely due to postoperative scarring. Stomach/Bowel: Normal stomach, without wall thickening. Surgical sutures within the descending colon. Normal terminal ileum and appendix. Normal small bowel. Vascular/Lymphatic: Aortic and branch vessel atherosclerosis. No abdominopelvic adenopathy. Reproductive: Mild prostatomegaly. Other: No significant free fluid. No evidence of omental or peritoneal disease. Right inguinal hernia repair. Anterior left pelvic wall minimal laxity, presumably at the site of prior ostomy. Musculoskeletal: Thoracolumbar spondylosis. IMPRESSION: 1. No acute process or evidence of metastatic disease within the chest, abdomen, or pelvis. 2. Mild limitations secondary to noncontrast technique. 3. Coronary artery atherosclerosis. Aortic Atherosclerosis (ICD10-I70.0). 4. Mild ascending aortic dilatation at 4.1 cm, similar. Recommend annual imaging followup by CTA or MRA. This recommendation follows 2010 ACCF/AHA/AATS/ACR/ASA/SCA/SCAI/SIR/STS/SVM Guidelines for the Diagnosis and Management of Patients with Thoracic Aortic Disease. Circulation. 2010; 121: V616-W737. Aortic aneurysm NOS (ICD10-I71.9) 5. Pulmonary artery enlargement suggests pulmonary arterial hypertension. 6. Similar appearance of the urinary bladder, with extension towards the entrance of the right inguinal canal, likely due to postoperative scarring. 7. Gynecomastia. Electronically Signed   By: Abigail Miyamoto M.D.   On: 05/20/2019 12:05    Medications: I have reviewed the patient's current medications.   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 Sigmoid/descending resection and creation of a descending colostomy 04/10/2016 ? MSI-stable, no loss of mismatch repair protein expression ? Foundation 1-BRAF V600Epositive, MS-stable, intermediate tumor mutation burden, no RAS mutation ? CT abdomen/pelvis 04/02/2016-no evidence of distant metastatic disease ? Cycle 1 FOLFOX 05/21/2016 ? Cycle 2 FOLFOX 06/04/2016 ? Cycle 3 FOLFOX 06/18/2016 ? Cycle 4 FOLFOX 07/02/2016 (oxaliplatin held secondary to thrombocytopenia) ? Cycle 5 FOLFOX 07/16/2016 ? Cycle 6 FOLFOX 07/30/2016 ? Cycle 7 FOLFOX 08/13/2016 (oxaliplatin  held and 5-FU dose reduced) ? Cycle 8 FOLFOX 09/03/2016 (oxaliplatin held secondary to neuropathy) ? Cycle 9 FOLFOX 09/17/2016 (oxaliplatin held secondary to neuropathy) ? Cycle 10 FOLFOX 10/02/2016 ? Cycle 11 FOLFOX 10/15/2016 (oxaliplatin eliminated from the regimen) ? Cycle 12 FOLFOX 10/30/2016 (oxaliplatin eliminated) ? CTs 05/12/2017-no evidence of recurrent disease, right inguinal hernia containing bladder ? Colonoscopy 07/21/2017, 3 polyps were removed from the descending and transverse colon, fragments of tubular and tubulovillous adenoma ? CTs 05/19/2018-no evidence of recurrent disease, status post hernia repair, right upper lobe pneumonia ? CTs 05/20/2019-resolution of right upper lobe pneumonia, no evidence of metastatic disease  2. Chronic renal insufficiency  3. Hypertension  4. Inflamed sebaceous cyst at the upper back-status post incision and drainage  5. Thrombocytopenia secondary chemotherapy-oxaliplatin dose reduced beginning with cycle 3 FOLFOX, oxaliplatin held with cycle 4 and cycle 7 FOLFOX  6. Oxaliplatin neuropathy-improved  7. History ofMucositis secondary chemotherapy  8.  Symptoms of pneumonia January 2020-CT 05/19/2018 consistent with right upper lobe pneumonia, Levaquin prescribed  05/20/2018  9.  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  He will return for an office visit and CEA in 6 months.  He is scheduled to have a Covid-19 vaccine this week.  Betsy Coder, MD  05/23/2019  12:53 PM

## 2019-05-24 ENCOUNTER — Telehealth: Payer: Self-pay | Admitting: Oncology

## 2019-05-24 NOTE — Telephone Encounter (Signed)
Scheduled per los. Called and left msg. Mailed printout  °

## 2019-05-25 ENCOUNTER — Ambulatory Visit: Payer: Medicare Other | Attending: Internal Medicine

## 2019-05-25 DIAGNOSIS — Z23 Encounter for immunization: Secondary | ICD-10-CM | POA: Diagnosis not present

## 2019-05-25 NOTE — Progress Notes (Signed)
   Covid-19 Vaccination Clinic  Name:  OSAMA COLESON    MRN: [id number] DOB: [birth year]  05/25/2019  Mr. Lesniak was observed post Covid-19 immunization for 15 minutes without incidence. He was provided with Vaccine Information Sheet and instruction to access the V-Safe system.   Mr. Krakow was instructed to call 911 with any severe reactions post vaccine: Marland Kitchen Difficulty breathing  . Swelling of your face and throat  . A fast heartbeat  . A bad rash all over your body  . Dizziness and weakness    Immunizations Administered    Name Date Dose VIS Date Route   Pfizer COVID-19 Vaccine 05/25/2019  6:21 PM 0.3 mL 04/15/2019 Intramuscular   Manufacturer: [place]   Lot: [id number]   [place]: [id number]

## 2019-06-12 ENCOUNTER — Ambulatory Visit: Payer: Medicare Other | Attending: Internal Medicine

## 2019-06-12 DIAGNOSIS — Z23 Encounter for immunization: Secondary | ICD-10-CM | POA: Insufficient documentation

## 2019-06-12 NOTE — Progress Notes (Signed)
   Covid-19 Vaccination Clinic  Name:  Louis [name]    MRN: [id number] DOB: 1945-09-29  06/12/2019  [name] was observed post Covid-19 immunization for 15 minutes without incidence. He was provided with Vaccine Information Sheet and instruction to access the V-Safe system.   [name] was instructed to call 911 with any severe reactions post vaccine: Marland Kitchen Difficulty breathing  . Swelling of your face and throat  . A fast heartbeat  . A bad rash all over your body  . Dizziness and weakness    Immunizations Administered    Name Date Dose VIS Date Route   Pfizer COVID-19 Vaccine 06/12/2019  8:42 AM 0.3 mL 04/15/2019 Intramuscular   Manufacturer: [place]   Lot: [id number]   [place]: [id number]

## 2019-06-17 ENCOUNTER — Ambulatory Visit: Payer: Medicare Other

## 2019-06-27 ENCOUNTER — Ambulatory Visit: Payer: Medicare Other

## 2019-07-13 DIAGNOSIS — N1832 Chronic kidney disease, stage 3b: Secondary | ICD-10-CM | POA: Diagnosis not present

## 2019-07-13 DIAGNOSIS — C187 Malignant neoplasm of sigmoid colon: Secondary | ICD-10-CM | POA: Diagnosis not present

## 2019-07-13 DIAGNOSIS — I129 Hypertensive chronic kidney disease with stage 1 through stage 4 chronic kidney disease, or unspecified chronic kidney disease: Secondary | ICD-10-CM | POA: Diagnosis not present

## 2019-07-13 DIAGNOSIS — E669 Obesity, unspecified: Secondary | ICD-10-CM | POA: Diagnosis not present

## 2019-07-13 DIAGNOSIS — N2581 Secondary hyperparathyroidism of renal origin: Secondary | ICD-10-CM | POA: Diagnosis not present

## 2019-07-13 DIAGNOSIS — E785 Hyperlipidemia, unspecified: Secondary | ICD-10-CM | POA: Diagnosis not present

## 2019-07-13 DIAGNOSIS — N179 Acute kidney failure, unspecified: Secondary | ICD-10-CM | POA: Diagnosis not present

## 2019-08-10 DIAGNOSIS — M170 Bilateral primary osteoarthritis of knee: Secondary | ICD-10-CM | POA: Diagnosis not present

## 2019-08-10 DIAGNOSIS — M1712 Unilateral primary osteoarthritis, left knee: Secondary | ICD-10-CM | POA: Diagnosis not present

## 2019-08-10 DIAGNOSIS — M25561 Pain in right knee: Secondary | ICD-10-CM | POA: Diagnosis not present

## 2019-08-10 DIAGNOSIS — M1711 Unilateral primary osteoarthritis, right knee: Secondary | ICD-10-CM | POA: Diagnosis not present

## 2019-08-10 DIAGNOSIS — M25562 Pain in left knee: Secondary | ICD-10-CM | POA: Diagnosis not present

## 2019-08-18 ENCOUNTER — Encounter: Payer: Self-pay | Admitting: Cardiology

## 2019-08-18 ENCOUNTER — Other Ambulatory Visit: Payer: Self-pay

## 2019-08-18 ENCOUNTER — Ambulatory Visit (INDEPENDENT_AMBULATORY_CARE_PROVIDER_SITE_OTHER): Payer: Medicare Other | Admitting: Cardiology

## 2019-08-18 ENCOUNTER — Other Ambulatory Visit: Payer: Self-pay | Admitting: Cardiology

## 2019-08-18 VITALS — BP 140/74 | HR 68 | Ht 76.0 in | Wt 245.0 lb

## 2019-08-18 DIAGNOSIS — Z01810 Encounter for preprocedural cardiovascular examination: Secondary | ICD-10-CM | POA: Insufficient documentation

## 2019-08-18 DIAGNOSIS — C189 Malignant neoplasm of colon, unspecified: Secondary | ICD-10-CM

## 2019-08-18 DIAGNOSIS — I10 Essential (primary) hypertension: Secondary | ICD-10-CM

## 2019-08-18 DIAGNOSIS — I447 Left bundle-branch block, unspecified: Secondary | ICD-10-CM

## 2019-08-18 DIAGNOSIS — I7090 Unspecified atherosclerosis: Secondary | ICD-10-CM

## 2019-08-18 HISTORY — DX: Left bundle-branch block, unspecified: I44.7

## 2019-08-18 NOTE — Patient Instructions (Signed)
Medication Instructions:  No medication changes *If you need a refill on your cardiac medications before your next appointment, please call your pharmacy*   Lab Work: None ordered If you have labs (blood work) drawn today and your tests are completely normal, you will receive your results only by: Marland Kitchen MyChart Message (if you have MyChart) OR . A paper copy in the mail If you have any lab test that is abnormal or we need to change your treatment, we will call you to review the results.   Testing/Procedures: The test will take approximately 3 to 4 hours to complete; you may bring reading material.  If someone comes with you to your appointment, they will need to remain in the main lobby due to limited space in the testing area.   How to prepare for your Myocardial Perfusion Test: . Do not eat or drink 3 hours prior to your test, except you may have water. . Do not consume products containing caffeine (regular or decaffeinated) 12 hours prior to your test. (ex: coffee, chocolate, sodas, tea). Do bring a list of your current medications with you.  If not listed below, you may take your medications as normal. . Do not take labetolol (Trandate) for 24 hours prior to the test.  Bring the medication to your appointment as you may be required to take it once the test is complete. . Do wear comfortable clothes (no dresses or overalls) and walking shoes, tennis shoes preferred (No heels or open toe shoes are allowed). . Do NOT wear cologne, perfume, aftershave, or lotions (deodorant is allowed). . If these instructions are not followed, your test will have to be rescheduled.    Follow-Up: At [facility], you and your health needs are our priority.  As part of our continuing mission to provide you with exceptional heart care, we have created designated Provider Care Teams.  These Care Teams include your primary Cardiologist (physician) and Advanced Practice Providers (APPs -  Physician Assistants  and Nurse Practitioners) who all work together to provide you with the care you need, when you need it.  We recommend signing up for the patient portal called "MyChart".  Sign up information is provided on this After Visit Summary.  MyChart is used to connect with patients for Virtual Visits (Telemedicine).  Patients are able to view lab/test results, encounter notes, upcoming appointments, etc.  Non-urgent messages can be sent to your provider as well.   To learn more about what you can do with MyChart, go to [email or web address].    Your next appointment:   6 month(s)  The format for your next appointment:   In Person  Provider:   Jyl Heinz, MD   Other Instructions NA

## 2019-08-18 NOTE — Progress Notes (Signed)
Cardiology Office Note:    Date:  08/18/2019   ID:  Louis [name], DOB 1945-07-30, MRN [id number]  PCP:  Donato Heinz, MD  Cardiologist:  Jenean Lindau, MD   Referring MD: Donato Heinz, MD    ASSESSMENT:    1. Essential hypertension   2. Atherosclerotic vascular disease   3. Preoperative cardiovascular examination    PLAN:    In order of problems listed above:  1. Primary prevention stressed with the patient.  Importance of compliance with diet medication stressed and he vocalized understanding. 2. Atherosclerotic vascular disease: Patient needs statin therapy but he cannot tolerate it.  He has tried statins in the past and he now wishes to be only on fish oil.  I respect his wishes and diet was emphasized. 3. Preoperative risk assessment: In view of this we will do a Lexiscan sestamibi.  He has multiple risk factors for coronary artery disease and leads a sedentary lifestyle. 4. Essential hypertension: Blood pressure is stable 5. If the aforementioned test is negative then he is not at high risk for coronary events during the aforementioned surgery.  Meticulous hemodynamic monitoring will further reduce the risk of coronary events.   Medication Adjustments/Labs and Tests Ordered: Current medicines are reviewed at length with the patient today.  Concerns regarding medicines are outlined above.  No orders of the defined types were placed in this encounter.  No orders of the defined types were placed in this encounter.    Chief Complaint  Patient presents with  . Follow-up     History of Present Illness:    Louis [name] is a 74 y.o. male.  Patient has past medical history of essential hypertension and left bundle branch block.  He has intestinal/colon cancer and he has been told that he is in the remission.  He is planning to undergo knee surgery.  He denies any problems at this time and takes care of activities of daily living.  He has mixed dyslipidemia  and is intolerant to statin therapy.  He is currently leading a sedentary lifestyle because of orthopedic issues involving his knee.  At the time of my evaluation, the patient is alert awake oriented and in no distress.  He is for preop risk assessment.  Past Medical History:  Diagnosis Date  . Allergy   . Arthritis   . CKD (chronic kidney disease) stage 2, GFR 60-89 ml/min    see [place], Stage 1  . colon ca dx'd 04/2016   colon  . Heart murmur    many years   . Hernia, abdominal   . Hyperlipidemia   . Hypertension   . Ileus ([place])   . Small bowel obstruction ([place])   . Thrombocytopenia ([place])    during chemo.    Past Surgical History:  Procedure Laterality Date  . BIOPSY  04/10/2016   Procedure: BIOPSY Mesentary;  Surgeon: Judeth Horn, MD;  Location: [place];  Service: General;;  . COLOSTOMY    . COLOSTOMY REVERSAL N/A 01/12/2017   Procedure: COLOSTOMY REVERSAL;  Surgeon: Judeth Horn, MD;  Location: [place];  Service: General;  Laterality: N/A;  . FLEXIBLE SIGMOIDOSCOPY N/A 04/09/2016   Procedure: Beryle Quant;  Surgeon: Ladene Artist, MD;  Location: [facility] END[facility]COPY;  Service: Endoscopy;  Laterality: N/A;  . HERNIA REPAIR     in childhood  . INGUINAL HERNIA REPAIR  03/29/2018  . INGUINAL HERNIA REPAIR Right 03/29/2018   Procedure: OPEN REPAIR OF RIGHT INGUINAL HERNIA WITH MESH;  Surgeon: Judeth Horn, MD;  Location: [place];  Service: General;  Laterality: Right;  . INSERTION OF MESH N/A 03/29/2018   Procedure: INSERTION OF MESH;  Surgeon: Judeth Horn, MD;  Location: [place];  Service: General;  Laterality: N/A;  . IR GENERIC HISTORICAL  05/14/2016   IR US GUIDE VASC ACCESS RIGHT 05/14/2016 Corrie Mckusick, DO [facility]  . IR GENERIC HISTORICAL  05/14/2016   IR FLUORO GUIDE PORT INSERTION RIGHT 05/14/2016 Corrie Mckusick, DO [facility]  . IR REMOVAL TUN ACCESS W/ PORT W/O FL MOD SED  11/11/2016  . PARTIAL COLECTOMY N/A 04/10/2016   Procedure: SIGMOID  COLECTOMY WITH COLOSTOMY;   Surgeon: Judeth Horn, MD;  Location: [place];  Service: General;  Laterality: N/A;  . TONSILLECTOMY      Current Medications: Current Meds  Medication Sig  . acetaminophen (TYLENOL 8 HOUR ARTHRITIS PAIN) 650 MG CR tablet Take 650 mg by mouth every morning.   Marland Kitchen amLODipine (NORVASC) 10 MG tablet Take 10 mg by mouth daily.  Marland Kitchen DM-APAP-CPM (CORICIDIN HBP PO) Take by mouth.  . labetalol (NORMODYNE) 200 MG tablet Take 200 mg by mouth 3 (three) times daily.   Marland Kitchen lisinopril (ZESTRIL) 5 MG tablet Take 5 mg by mouth daily.  Marland Kitchen loratadine (CLARITIN) 10 MG tablet Take 10 mg by mouth daily.   . Omega-3 Fatty Acids (FISH OIL) 1200 MG CAPS Take 3,600 mg by mouth daily.      Allergies:   Patient has no known allergies.   Social History   Socioeconomic History  . Marital status: Married    Spouse name: Not on file  . Number of children: Not on file  . Years of education: Not on file  . Highest education level: Not on file  Occupational History  . Not on file  Tobacco Use  . Smoking status: Never Smoker  . Smokeless tobacco: Never Used  Substance and Sexual Activity  . Alcohol use: Yes    Alcohol/week: 1.0 - 2.0 standard drinks    Types: 1 - 2 Glasses of wine per week    Comment: once a week  . Drug use: No  . Sexual activity: Not on file  Other Topics Concern  . 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 Difficulty of Paying Living Expenses:   Food Insecurity:   . Worried About Charity fundraiser in the Last Year:   . Arboriculturist in the Last Year:   Transportation Needs:   . Film/video editor (Medical):   Marland Kitchen Lack of Transportation (Non-Medical):   Physical Activity:   . Days of Exercise per Week:   . Minutes of Exercise per Session:   Stress:   . Feeling of Stress :   Social Connections:   . Frequency of  Communication with Friends and Family:   . Frequency of Social Gatherings with Friends and Family:   . Attends Religious Services:   . Active Member of Clubs or Organizations:   . Attends Archivist Meetings:   Marland Kitchen Marital Status:      Family History: The patient's family history includes Diverticulitis in his brother; Hyperlipidemia in his father. There is no history of Colon cancer, Esophageal cancer, Rectal cancer, or Stomach cancer.  ROS:   Please see  the history of present illness.    All other systems reviewed and are negative.  EKGs/Labs/Other Studies Reviewed:    The following studies were reviewed today: EKG reveals sinus rhythm and left bundle branch block.   Recent Labs: No results found for requested labs within last 8760 hours.  Recent Lipid Panel    Component Value Date/Time   CHOL 159 07/02/2016 0808   TRIG 146 07/02/2016 0808   HDL 47 07/02/2016 0808   CHOLHDL 3.4 07/02/2016 0808   LDLCALC 83 07/02/2016 0808    Physical Exam:    VS:  BP 140/74   Pulse 68   Ht 6\' 4"  (1.93 m)   Wt 245 lb (111.1 kg)   SpO2 96%   BMI 29.82 kg/m     Wt Readings from Last 3 Encounters:  08/18/19 245 lb (111.1 kg)  05/23/19 246 lb 12.8 oz (111.9 kg)  11/19/18 247 lb 8 oz (112.3 kg)     GEN: Patient is in no acute distress HEENT: Normal NECK: No JVD; No carotid bruits LYMPHATICS: No lymphadenopathy CARDIAC: Hear sounds regular, 2/6 systolic murmur at the apex. RESPIRATORY:  Clear to auscultation without rales, wheezing or rhonchi  ABDOMEN: Soft, non-tender, non-distended MUSCULOSKELETAL:  No edema; No deformity  SKIN: Warm and dry NEUROLOGIC:  Alert and oriented x 3 PSYCHIATRIC:  Normal affect   Signed, Jenean Lindau, MD  08/18/2019 3:04 PM    [facility]

## 2019-08-24 ENCOUNTER — Telehealth (HOSPITAL_COMMUNITY): Payer: Self-pay | Admitting: *Deleted

## 2019-08-24 NOTE — Telephone Encounter (Signed)
Patient given detailed instructions per Myocardial Perfusion Study Information Sheet for the test on 08/29/19 at 10:15. Patient notified to arrive 15 minutes early and that it is imperative to arrive on time for appointment to keep from having the test rescheduled.  If you need to cancel or reschedule your appointment, please call the office within 24 hours of your appointment. . Patient verbalized understanding.Louis [name]

## 2019-08-29 ENCOUNTER — Other Ambulatory Visit: Payer: Self-pay

## 2019-08-29 ENCOUNTER — Ambulatory Visit (HOSPITAL_COMMUNITY): Payer: Medicare Other | Attending: Cardiology

## 2019-08-29 VITALS — Ht 76.0 in | Wt 245.0 lb

## 2019-08-29 DIAGNOSIS — R9431 Abnormal electrocardiogram [ECG] [EKG]: Secondary | ICD-10-CM | POA: Diagnosis not present

## 2019-08-29 DIAGNOSIS — Z01810 Encounter for preprocedural cardiovascular examination: Secondary | ICD-10-CM | POA: Insufficient documentation

## 2019-08-29 DIAGNOSIS — I7090 Unspecified atherosclerosis: Secondary | ICD-10-CM

## 2019-08-29 LAB — MYOCARDIAL PERFUSION IMAGING
LV dias vol: 151 mL (ref 62–150)
LV sys vol: 71 mL
Peak HR: 76 {beats}/min
Rest HR: 59 {beats}/min
SDS: 6
SRS: 2
SSS: 8
TID: 1.07

## 2019-08-29 MED ORDER — REGADENOSON 0.4 MG/5ML IV SOLN
0.4000 mg | Freq: Once | INTRAVENOUS | Status: AC
  Administered 2019-08-29: 0.4 mg via INTRAVENOUS

## 2019-08-29 MED ORDER — TECHNETIUM TC 99M TETROFOSMIN IV KIT
10.8000 | PACK | Freq: Once | INTRAVENOUS | Status: AC | PRN
  Administered 2019-08-29: 10.8 via INTRAVENOUS
  Filled 2019-08-29: qty 11

## 2019-08-29 MED ORDER — TECHNETIUM TC 99M TETROFOSMIN IV KIT
32.4000 | PACK | Freq: Once | INTRAVENOUS | Status: AC | PRN
  Administered 2019-08-29: 32.4 via INTRAVENOUS
  Filled 2019-08-29: qty 33

## 2019-09-16 ENCOUNTER — Encounter (HOSPITAL_COMMUNITY): Payer: Self-pay

## 2019-09-16 NOTE — Patient Instructions (Addendum)
DUE TO COVID-19 ONLY ONE VISITOR ARE ALLOWED TO COME WITH YOU AND STAY IN THE WAITING ROOM ONLY DURING PRE OP AND PROCEDURE. THEN TWO VISITORS MAY VISIT WITH YOU IN YOUR PRIVATE ROOM DURING VISITING HOURS ONLY!!   COVID SWAB TESTING MUST BE COMPLETED ON:  Monday, Sep 26, 2019 at 2:40PM [address] [facility] (Must self quarantine after testing. Follow instructions on handout.)         Your procedure is scheduled on: Thursday, Sep 29, 2019   Report to [facility] Main  Entrance    Report to admitting at 7:30 AM   Call this number if you have problems the morning of surgery [phone]   Do not eat food:After Midnight.   May have liquids until 7:00 AM day of surgery   CLEAR LIQUID DIET  Foods Allowed                                                                     Foods Excluded  Water, Black Coffee and tea, regular and decaf                             liquids that you cannot  Plain Jell-O in any flavor  (No red)                                           see through such as: Fruit ices (not with fruit pulp)                                     milk, soups, orange juice  Iced Popsicles (No red)                                    All solid food Carbonated beverages, regular and diet                                    Apple juices Sports drinks like Gatorade (No red) Lightly seasoned clear broth or consume(fat free) Sugar, honey syrup  Sample Menu Breakfast                                Lunch                                     Supper Cranberry juice                    Beef broth                            Chicken broth Jell-O  Grape juice                           Apple juice Coffee or tea                        Jell-O                                      Popsicle                                                Coffee or tea                        Coffee or  tea   Complete one Ensure drink the morning of surgery at   7:00 AM    the day of surgery.   Oral Hygiene is also important to reduce your risk of infection.                                    Remember - BRUSH YOUR TEETH THE MORNING OF SURGERY WITH YOUR REGULAR TOOTHPASTE   Do NOT smoke after Midnight   Take these medicines the morning of surgery with A SIP OF WATER: Amlodipine, Labetalol                               You may not have any metal on your body including jewelry, and body piercings             Do not wear lotions, powders, perfumes/cologne, or deodorant                          Men may shave face and neck.   Do not bring valuables to the hospital. [place].   Contacts, dentures or bridgework may not be worn into surgery.   Bring small overnight bag day of surgery.    Patients discharged the day of surgery will not be allowed to drive home.   Special Instructions: Bring a copy of your healthcare power of attorney and living will documents         the day of surgery if you haven't scanned them in before.              Please read over the following fact sheets you were given: IF YOU HAVE QUESTIONS ABOUT YOUR PRE OP INSTRUCTIONS PLEASE CALL [phone]   [place] - Preparing for Surgery Before surgery, you can play an important role.  Because skin is not sterile, your skin needs to be as free of germs as possible.  You can reduce the number of germs on your skin by washing with CHG (chlorahexidine gluconate) soap before surgery.  CHG is an antiseptic cleaner which kills germs and bonds with the skin to continue killing germs even after washing. Please DO NOT use if you have an allergy to CHG or antibacterial soaps.  If your skin becomes reddened/irritated stop using the CHG and inform your nurse when you arrive at Short Stay. Do not shave (including legs and underarms) for at least 48 hours prior to the first CHG shower.   You may shave your face/neck.  Please follow these instructions carefully:  1.  Shower with CHG Soap the night before surgery and the  morning of surgery.  2.  If you choose to wash your hair, wash your hair first as usual with your normal  shampoo.  3.  After you shampoo, rinse your hair and body thoroughly to remove the shampoo.                             4.  Use CHG as you would any other liquid soap.  You can apply chg directly to the skin and wash.  Gently with a scrungie or clean washcloth.  5.  Apply the CHG Soap to your body ONLY FROM THE NECK DOWN.   Do   not use on face/ open                           Wound or open sores. Avoid contact with eyes, ears mouth and   genitals (private parts).                       Wash face,  Genitals (private parts) with your normal soap.             6.  Wash thoroughly, paying special attention to the area where your    surgery  will be performed.  7.  Thoroughly rinse your body with warm water from the neck down.  8.  DO NOT shower/wash with your normal soap after using and rinsing off the CHG Soap.                9.  Pat yourself dry with a clean towel.            10.  Wear clean pajamas.            11.  Place clean sheets on your bed the night of your first shower and do not  sleep with pets. Day of Surgery : Do not apply any lotions/deodorants the morning of surgery.  Please wear clean clothes to the hospital/surgery center.  FAILURE TO FOLLOW THESE INSTRUCTIONS MAY RESULT IN THE CANCELLATION OF YOUR SURGERY  PATIENT SIGNATURE_________________________________  NURSE SIGNATURE__________________________________  ________________________________________________________________________   Adam Phenix  An incentive spirometer is a tool that can help keep your lungs clear and active. This tool measures how well you are filling your lungs with each breath. Taking long deep breaths may help reverse or decrease the chance of developing  breathing (pulmonary) problems (especially infection) following:  A long period of time when you are unable to move or be active. BEFORE THE PROCEDURE   If the spirometer includes an indicator to show your best effort, your nurse or respiratory therapist will set it to a desired goal.  If possible, sit up straight or lean slightly forward. Try not to slouch.  Hold the incentive spirometer in an upright position. INSTRUCTIONS FOR USE  1. Sit on the edge of your bed if possible, or sit up as far as you can in bed or on a chair. 2. Hold the incentive spirometer in an upright position. 3. Breathe out  normally. 4. Place the mouthpiece in your mouth and seal your lips tightly around it. 5. Breathe in slowly and as deeply as possible, raising the piston or the ball toward the top of the column. 6. Hold your breath for 3-5 seconds or for as long as possible. Allow the piston or ball to fall to the bottom of the column. 7. Remove the mouthpiece from your mouth and breathe out normally. 8. Rest for a few seconds and repeat Steps 1 through 7 at least 10 times every 1-2 hours when you are awake. Take your time and take a few normal breaths between deep breaths. 9. The spirometer may include an indicator to show your best effort. Use the indicator as a goal to work toward during each repetition. 10. After each set of 10 deep breaths, practice coughing to be sure your lungs are clear. If you have an incision (the cut made at the time of surgery), support your incision when coughing by placing a pillow or rolled up towels firmly against it. Once you are able to get out of bed, walk around indoors and cough well. You may stop using the incentive spirometer when instructed by your caregiver.  RISKS AND COMPLICATIONS  Take your time so you do not get dizzy or light-headed.  If you are in pain, you may need to take or ask for pain medication before doing incentive spirometry. It is harder to take a deep  breath if you are having pain. AFTER USE  Rest and breathe slowly and easily.  It can be helpful to keep track of a log of your progress. Your caregiver can provide you with a simple table to help with this. If you are using the spirometer at home, follow these instructions: [place] IF:   You are having difficultly using the spirometer.  You have trouble using the spirometer as often as instructed.  Your pain medication is not giving enough relief while using the spirometer.  You develop fever of 100.5 F (38.1 C) or higher. SEEK IMMEDIATE MEDICAL CARE IF:   You cough up bloody sputum that had not been present before.  You develop fever of 102 F (38.9 C) or greater.  You develop worsening pain at or near the incision site. MAKE SURE YOU:   Understand these instructions.  Will watch your condition.  Will get help right away if you are not doing well or get worse. Document Released: 09/01/2006 Document Revised: 07/14/2011 Document Reviewed: 11/02/2006 ExitCare Patient Information 2014 ExitCare, [place].   ________________________________________________________________________  WHAT IS A BLOOD TRANSFUSION? Blood Transfusion Information  A transfusion is the replacement of blood or some of its parts. Blood is made up of multiple cells which provide different functions.  Red blood cells carry oxygen and are used for blood loss replacement.  White blood cells fight against infection.  Platelets control bleeding.  Plasma helps clot blood.  Other blood products are available for specialized needs, such as hemophilia or other clotting disorders. BEFORE THE TRANSFUSION  Who gives blood for transfusions?   Healthy volunteers who are fully evaluated to make sure their blood is safe. This is blood bank blood. Transfusion therapy is the safest it has ever been in the practice of medicine. Before blood is taken from a donor, a complete history is taken to make sure  that person has no history of diseases nor engages in risky social behavior (examples are intravenous drug use or sexual activity with multiple partners). The donor's travel  history is screened to minimize risk of transmitting infections, such as malaria. The donated blood is tested for signs of infectious diseases, such as HIV and hepatitis. The blood is then tested to be sure it is compatible with you in order to minimize the chance of a transfusion reaction. If you or a relative donates blood, this is often done in anticipation of surgery and is not appropriate for emergency situations. It takes many days to process the donated blood. RISKS AND COMPLICATIONS Although transfusion therapy is very safe and saves many lives, the main dangers of transfusion include:   Getting an infectious disease.  Developing a transfusion reaction. This is an allergic reaction to something in the blood you were given. Every precaution is taken to prevent this. The decision to have a blood transfusion has been considered carefully by your caregiver before blood is given. Blood is not given unless the benefits outweigh the risks. AFTER THE TRANSFUSION  Right after receiving a blood transfusion, you will usually feel much better and more energetic. This is especially true if your red blood cells have gotten low (anemic). The transfusion raises the level of the red blood cells which carry oxygen, and this usually causes an energy increase.  The nurse administering the transfusion will monitor you carefully for complications. HOME CARE INSTRUCTIONS  No special instructions are needed after a transfusion. You may find your energy is better. Speak with your caregiver about any limitations on activity for underlying diseases you may have. SEEK MEDICAL CARE IF:   Your condition is not improving after your transfusion.  You develop redness or irritation at the intravenous (IV) site. SEEK IMMEDIATE MEDICAL CARE IF:  Any of  the following symptoms occur over the next 12 hours:  Shaking chills.  You have a temperature by mouth above 102 F (38.9 C), not controlled by medicine.  Chest, back, or muscle pain.  People around you feel you are not acting correctly or are confused.  Shortness of breath or difficulty breathing.  Dizziness and fainting.  You get a rash or develop hives.  You have a decrease in urine output.  Your urine turns a dark color or changes to pink, red, or brown. Any of the following symptoms occur over the next 10 days:  You have a temperature by mouth above 102 F (38.9 C), not controlled by medicine.  Shortness of breath.  Weakness after normal activity.  The white part of the eye turns yellow (jaundice).  You have a decrease in the amount of urine or are urinating less often.  Your urine turns a dark color or changes to pink, red, or brown. Document Released: 04/18/2000 Document Revised: 07/14/2011 Document Reviewed: 12/06/2007 [facility] Patient Information 2014 [place], [place].  _______________________________________________________________________

## 2019-09-18 DIAGNOSIS — J069 Acute upper respiratory infection, unspecified: Secondary | ICD-10-CM | POA: Diagnosis not present

## 2019-09-20 ENCOUNTER — Encounter (HOSPITAL_COMMUNITY)
Admission: RE | Admit: 2019-09-20 | Discharge: 2019-09-20 | Disposition: A | Discharge status: Home / Self Care | Payer: Medicare Other | Source: Ambulatory Visit | Attending: Orthopedic Surgery | Admitting: Orthopedic Surgery

## 2019-09-20 ENCOUNTER — Encounter (HOSPITAL_COMMUNITY): Payer: Self-pay

## 2019-09-20 ENCOUNTER — Other Ambulatory Visit: Payer: Self-pay

## 2019-09-20 DIAGNOSIS — Z79899 Other long term (current) drug therapy: Secondary | ICD-10-CM | POA: Insufficient documentation

## 2019-09-20 DIAGNOSIS — I129 Hypertensive chronic kidney disease with stage 1 through stage 4 chronic kidney disease, or unspecified chronic kidney disease: Secondary | ICD-10-CM | POA: Insufficient documentation

## 2019-09-20 DIAGNOSIS — Z7901 Long term (current) use of anticoagulants: Secondary | ICD-10-CM | POA: Diagnosis not present

## 2019-09-20 DIAGNOSIS — Z01812 Encounter for preprocedural laboratory examination: Secondary | ICD-10-CM | POA: Insufficient documentation

## 2019-09-20 DIAGNOSIS — M1712 Unilateral primary osteoarthritis, left knee: Secondary | ICD-10-CM | POA: Diagnosis not present

## 2019-09-20 DIAGNOSIS — I447 Left bundle-branch block, unspecified: Secondary | ICD-10-CM | POA: Diagnosis not present

## 2019-09-20 DIAGNOSIS — N183 Chronic kidney disease, stage 3 unspecified: Secondary | ICD-10-CM | POA: Diagnosis not present

## 2019-09-20 DIAGNOSIS — R011 Cardiac murmur, unspecified: Secondary | ICD-10-CM | POA: Insufficient documentation

## 2019-09-20 HISTORY — DX: Polyneuropathy, unspecified: G62.9

## 2019-09-20 HISTORY — DX: Other seasonal allergic rhinitis: J30.2

## 2019-09-20 HISTORY — DX: Thoracic aortic ectasia: I77.810

## 2019-09-20 LAB — CBC
HCT: 41.7 % (ref 39.0–52.0)
Hemoglobin: 13.5 g/dL (ref 13.0–17.0)
MCH: 30.2 pg (ref 26.0–34.0)
MCHC: 32.4 g/dL (ref 30.0–36.0)
MCV: 93.3 fL (ref 80.0–100.0)
Platelets: 164 10*3/uL (ref 150–400)
RBC: 4.47 MIL/uL (ref 4.22–5.81)
RDW: 13.9 % (ref 11.5–15.5)
WBC: 5.1 10*3/uL (ref 4.0–10.5)
nRBC: 0 % (ref 0.0–0.2)

## 2019-09-20 LAB — SURGICAL PCR SCREEN
MRSA, PCR: NEGATIVE
Staphylococcus aureus: NEGATIVE

## 2019-09-20 LAB — ABO/RH: ABO/RH(D): A POS

## 2019-09-20 LAB — BASIC METABOLIC PANEL
Anion gap: 9 (ref 5–15)
BUN: 37 mg/dL — ABNORMAL HIGH (ref 8–23)
CO2: 21 mmol/L — ABNORMAL LOW (ref 22–32)
Calcium: 8.9 mg/dL (ref 8.9–10.3)
Chloride: 110 mmol/L (ref 98–111)
Creatinine, Ser: 2.56 mg/dL — ABNORMAL HIGH (ref 0.61–1.24)
GFR calc Af Amer: 28 mL/min — ABNORMAL LOW (ref >60)
GFR calc non Af Amer: 24 mL/min — ABNORMAL LOW (ref >60)
Glucose, Bld: 101 mg/dL — ABNORMAL HIGH (ref 70–99)
Potassium: 4.4 mmol/L (ref 3.5–5.1)
Sodium: 140 mmol/L (ref 135–145)

## 2019-09-20 NOTE — Progress Notes (Signed)
BMP faxed to Dr. Alvan Dame via epic. Chart given to Lenise Arena. P.A. for review.

## 2019-09-20 NOTE — Progress Notes (Addendum)
Has completed COVID 19 Vaccine series  PCP - Dr. Carolynne Edouard Cardiologist - Dr. Alfonso Patten. Revankar last office visit 08/18/19  Chest x-ray - greater than 1 year EKG - 08/18/19 in epic Stress Test - 08/29/19 in epic ECHO - 01/19/18 in epic Cardiac Cath - N/A  Sleep Study - N/A CPAP - N/A  Fasting Blood Sugar - N/A Checks Blood Sugar _N/A____ times a day  Blood Thinner Instructions: N/A Aspirin Instructions: N/A Last Dose: N/A  Anesthesia review: HTN, CKD, Ascending aorta dilation  Patient denies shortness of breath, fever, cough and chest pain at PAT appointment   Patient verbalized understanding of instructions that were given to them at the PAT appointment. Patient was also instructed that they will need to review over the PAT instructions again at home before surgery.

## 2019-09-21 NOTE — Anesthesia Preprocedure Evaluation (Addendum)
Anesthesia Evaluation  Patient identified by MRN, date of birth, ID band Patient awake    Reviewed: Allergy & Precautions, NPO status , Patient's Chart, lab work & pertinent test results, reviewed documented beta blocker date and time   History of Anesthesia Complications Negative for: history of anesthetic complications  Airway Mallampati: II  TM Distance: >3 FB Neck ROM: Full    Dental  (+) Missing,    Pulmonary neg pulmonary ROS,    Pulmonary exam normal        Cardiovascular hypertension, Pt. on medications and Pt. on home beta blockers Normal cardiovascular exam  Nuclear stress 08/2019: EF 53%, low risk study  TTE 2019: moderate LVH, EF 78%, grade 1 diastolic dysfunction), dilated aortic root and ascending aorta (45 mm), mild RAE   LBBB   Neuro/Psych negative neurological ROS  negative psych ROS   GI/Hepatic negative GI ROS, Neg liver ROS,   Endo/Other  negative endocrine ROS  Renal/GU Renal InsufficiencyRenal disease  negative genitourinary   Musculoskeletal  (+) Arthritis ,   Abdominal   Peds  Hematology negative hematology ROS (+)   Anesthesia Other Findings Day of surgery medications reviewed with patient.  Reproductive/Obstetrics negative OB ROS                           Anesthesia Physical Anesthesia Plan  ASA: II  Anesthesia Plan: Spinal   Post-op Pain Management:  Regional for Post-op pain   Induction:   PONV Risk Score and Plan: 2 and Treatment may vary due to age or medical condition, Ondansetron, Propofol infusion and Dexamethasone  Airway Management Planned: Natural Airway and Simple Face Mask  Additional Equipment: None  Intra-op Plan:   Post-operative Plan:   Informed Consent: I have reviewed the patients History and Physical, chart, labs and discussed the procedure including the risks, benefits and alternatives for the proposed anesthesia with the patient  or authorized representative who has indicated his/her understanding and acceptance.       Plan Discussed with: CRNA  Anesthesia Plan Comments: (See PAT note 09/20/2019, Konrad Felix, PA-C)      Anesthesia Quick Evaluation

## 2019-09-21 NOTE — Progress Notes (Signed)
Anesthesia Chart Review   Case: [id number] Date/Time: 09/29/19 0945   Procedure: TOTAL KNEE ARTHROPLASTY (Left Knee) - 70 mins   Anesthesia type: Spinal   Pre-op diagnosis: Left knee osteoarthriris   Location: [facility] ROOM 09 / [facility]   Surgeons: Paralee Cancel, MD      DISCUSSION:73 y.o. never smoker with h/o HTN, HLD, LBBB, colon cancer s/p sigmoid resection s/p chemo now in remission, CKD Stage III, left knee OA scheduled for above procedure 09/29/2019 with Paralee Cancel.    Pt last seen by cardiology 08/18/2019 for preoperative evaluation.  Per OV note, "1. Primary prevention stressed with the patient.  Importance of compliance with diet medication stressed and he vocalized understanding. 2. Atherosclerotic vascular disease: Patient needs statin therapy but he cannot tolerate it.  He has tried statins in the past and he now wishes to be only on fish oil.  I respect his wishes and diet was emphasized. 3. Preoperative risk assessment: In view of this we will do a Lexiscan sestamibi.  He has multiple risk factors for coronary artery disease and leads a sedentary lifestyle. 4. Essential hypertension: Blood pressure is stable 5. If the aforementioned test is negative then he is not at high risk for coronary events during the aforementioned surgery.  Meticulous hemodynamic monitoring will further reduce the risk of coronary events."'  Low risk stress test 08/29/2019.   Elevated creatinine consistent with pt's baseline ~2.3.   Anticipate pt can proceed with planned procedure barring acute status change.   VS: BP 138/71 (BP Location: Right Arm)   Pulse 65   Temp 37 C (Oral)   Resp 18   Ht 6\' 4"  (1.93 m)   Wt 110.2 kg   SpO2 98%   BMI 29.58 kg/m   PROVIDERS: Donato Heinz, MD is PCP    LABS: Labs reviewed: Acceptable for surgery. (all labs ordered are listed, but only abnormal results are displayed)  Labs Reviewed  BASIC METABOLIC PANEL - Abnormal; Notable for the following  components:      Result Value   CO2 21 (*)    Glucose, Bld 101 (*)    BUN 37 (*)    Creatinine, Ser 2.56 (*)    GFR calc non Af Amer 24 (*)    GFR calc Af Amer 28 (*)    All other components within normal limits  SURGICAL PCR SCREEN  CBC  TYPE AND SCREEN  ABO/RH     IMAGES:   EKG: 08/18/2019 Rate 68 bpm  Normal sinus rhythm  Left bundle branch block   CV: Myocardial Perfusion 08/29/2019  The left ventricular ejection fraction is mildly decreased (45-54%).  Nuclear stress EF: 53%. Contractile performance compatible with left bundle branch block  There was no ST segment deviation noted during stress.  Defect 1: There is a small defect of mild severity present in the apex location. Possible artifact  This is a low risk study. There are no areas of significant ischemia identified.   Overall, no significant change from prior nuclear stress test in 2019.  Echo 01/19/2018 Study Conclusions   - Left ventricle: The cavity size was normal. Wall thickness was  increased in a pattern of moderate LVH. The estimated ejection  fraction was 55%. Septal-lateral dyssynchrony consistent with  LBBB. Doppler parameters are consistent with abnormal left  ventricular relaxation (grade 1 diastolic dysfunction).  - Aortic valve: There was no stenosis.  - Aorta: Dilated aortic root and ascending aorta. Ascending aorta  dimension: 45 mm. Aortic  root dimension: 41 mm (ED).  - Mitral valve: There was no significant regurgitation.  - Right ventricle: The cavity size was normal. Systolic function  was normal.  - Right atrium: The atrium was mildly dilated.  - Tricuspid valve: Peak RV-RA gradient (S): 18 mm Hg.  - Pulmonary arteries: PA peak pressure: 21 mm Hg (S).  - Inferior vena cava: The vessel was normal in size. The  respirophasic diameter changes were in the normal range (= 50%),  consistent with normal central venous pressure.   Impressions:   - Normal LV size with  moderate LV hypertrophy. EF 55% with  septal-lateral dyssynchrony consistent with LBBB. Normal RV size  and systolic function. No significant valvular abnormalities.  Past Medical History:  Diagnosis Date  . Arthritis   . Ascending aorta dilatation (HCC)    4.1 cm noted on CT1/15/21  . CKD (chronic kidney disease) stage 2, GFR 60-89 ml/min    see [place], Stage 1  . colon ca dx'd 04/2016   colon  . Heart murmur    many years   . Hernia, abdominal   . Hyperlipidemia   . Hypertension   . Ileus ([place])   . LBBB (left bundle branch block) 08/18/2019   NOTED ON EKG  . Neuropathy   . Pneumonia 05/19/2018  . Seasonal allergies   . Small bowel obstruction ([place])   . 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    Past Surgical History:  Procedure Laterality Date  . BIOPSY  04/10/2016   Procedure: BIOPSY Mesentary;  Surgeon: Judeth Horn, MD;  Location: [facility];  Service: General;;  . COLOSTOMY    . COLOSTOMY REVERSAL N/A 01/12/2017   Procedure: COLOSTOMY REVERSAL;  Surgeon: Judeth Horn, MD;  Location: [place];  Service: General;  Laterality: N/A;  . FLEXIBLE SIGMOIDOSCOPY N/A 04/09/2016   Procedure: Beryle Quant;  Surgeon: Ladene Artist, MD;  Location: [facility] ENDOSCOPY;  Service: Endoscopy;  Laterality: N/A;  . HERNIA REPAIR     in childhood  . INGUINAL HERNIA REPAIR Right 03/29/2018   Procedure: OPEN REPAIR OF RIGHT INGUINAL HERNIA WITH MESH;  Surgeon: Judeth Horn, MD;  Location: [place];  Service: General;  Laterality: Right;  . INSERTION OF MESH N/A 03/29/2018   Procedure: INSERTION OF MESH;  Surgeon: Judeth Horn, MD;  Location: [place];  Service: General;  Laterality: N/A;  . IR GENERIC HISTORICAL  05/14/2016   IR US GUIDE VASC ACCESS RIGHT 05/14/2016 Corrie Mckusick, DO [facility]  . IR GENERIC HISTORICAL  05/14/2016   IR FLUORO GUIDE PORT INSERTION RIGHT 05/14/2016 Corrie Mckusick, DO [facility]  . IR REMOVAL TUN ACCESS W/ PORT W/O FL MOD SED  11/11/2016  . PARTIAL COLECTOMY  N/A 04/10/2016   Procedure: SIGMOID  COLECTOMY WITH COLOSTOMY;  Surgeon: Judeth Horn, MD;  Location: [place];  Service: General;  Laterality: N/A;  . TONSILLECTOMY      MEDICATIONS: . acetaminophen (TYLENOL 8 HOUR ARTHRITIS PAIN) 650 MG CR tablet  . amLODipine (NORVASC) 10 MG tablet  . DM-APAP-CPM (CORICIDIN HBP PO)  . labetalol (NORMODYNE) 200 MG tablet  . lisinopril (ZESTRIL) 5 MG tablet  . loratadine (CLARITIN) 10 MG tablet  . Omega-3 Fatty Acids (FISH OIL) 1200 MG CAPS   No current facility-administered medications for this encounter.   Maia Plan [facility] Pre-Surgical Testing [phone] 09/21/19  3:37 PM

## 2019-09-26 ENCOUNTER — Other Ambulatory Visit (HOSPITAL_COMMUNITY)
Admission: RE | Admit: 2019-09-26 | Discharge: 2019-09-26 | Disposition: A | Discharge status: Home / Self Care | Payer: Medicare Other | Source: Ambulatory Visit | Attending: Orthopedic Surgery | Admitting: Orthopedic Surgery

## 2019-09-26 DIAGNOSIS — Z01812 Encounter for preprocedural laboratory examination: Secondary | ICD-10-CM | POA: Diagnosis not present

## 2019-09-26 DIAGNOSIS — Z20822 Contact with and (suspected) exposure to covid-19: Secondary | ICD-10-CM | POA: Diagnosis not present

## 2019-09-26 LAB — SARS CORONAVIRUS 2 (TAT 6-24 HRS): SARS Coronavirus 2: NEGATIVE

## 2019-09-27 NOTE — H&P (Signed)
TOTAL KNEE ADMISSION H&P  Patient is being admitted for left total knee arthroplasty.  Subjective:  Chief Complaint:  Left knee primary OA / pain  HPI: Louis [name], 74 y.o. male, has a history of pain and functional disability in the left knee due to arthritis and has failed non-surgical conservative treatments for greater than 12 weeks to include NSAID's and/or analgesics, corticosteriod injections and activity modification.  Onset of symptoms was gradual, starting 2 years ago with gradually worsening course since that time. The patient noted no past surgery on the left knee(s).  Patient currently rates pain in the left knee(s) at 8 out of 10 with activity. Patient has worsening of pain with activity and weight bearing, pain that interferes with activities of daily living, pain with passive range of motion, crepitus and joint swelling.  Patient has evidence of periarticular osteophytes and joint space narrowing by imaging studies. There is no active infection.  Risks, benefits and expectations were discussed with the patient.  Risks including but not limited to the risk of anesthesia, blood clots, nerve damage, blood vessel damage, failure of the prosthesis, infection and up to and including death.  Patient understand the risks, benefits and expectations and wishes to proceed with surgery.   PCP: Donato Heinz, MD  D/C Plans:       Home   Post-op Meds:       No Rx given   Tranexamic Acid:      To be given - IV   Decadron:      Is to be given  FYI:      ASA  Norco  DME:   Rx sent for - RW & 3-n-1  PT:   [facility]  Pharmacy: [facility] - [address], [place], [place]   Patient Active Problem List   Diagnosis Date Noted  . Preoperative cardiovascular examination 08/18/2019  . S/P right inguinal hernia repair 03/29/2018  . New onset left bundle branch block (LBBB) 01/12/2018  . Atherosclerotic vascular disease 01/12/2018  . Colostomy in place [facility]) 01/12/2017  .  Port catheter in place 07/02/2016  . 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 Elevated fasting glucose 04/10/2016  . Right inguinal hernia 04/10/2016  . Adenocarcinoma of colon ([place]) 04/10/2016  . Enterocolitis   . Essential hypertension 04/04/2016  . Acute kidney injury superimposed on chronic kidney disease Stage 2 ([place]) 04/04/2016  . Ileus ([place]) 04/02/2016   Past Medical History:  Diagnosis Date  . Arthritis   . Ascending aorta dilatation (HCC)    4.1 cm noted on CT1/15/21  . CKD (chronic kidney disease) stage 2, GFR 60-89 ml/min    see [place], Stage 1  . colon ca dx'd 04/2016   colon  . Heart murmur    many years   . Hernia, abdominal   . Hyperlipidemia   . Hypertension   . Ileus ([place])   . LBBB (left bundle branch block) 08/18/2019   NOTED ON EKG  . Neuropathy   . Pneumonia 05/19/2018  . Seasonal allergies   . Small bowel obstruction ([place])   . 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    Past Surgical History:  Procedure Laterality Date  . BIOPSY  04/10/2016   Procedure: BIOPSY Mesentary;  Surgeon: Judeth Horn, MD;  Location: [place];  Service: General;;  . COLOSTOMY    . COLOSTOMY REVERSAL N/A 01/12/2017   Procedure: COLOSTOMY REVERSAL;  Surgeon: Judeth Horn, MD;  Location: [place];  Service: General;  Laterality:  N/A;  . FLEXIBLE SIGMOIDOSCOPY N/A 04/09/2016   Procedure: FLEXIBLE SIGMOIDOSCOPY;  Surgeon: Ladene Artist, MD;  Location: [facility] ENDOSCOPY;  Service: Endoscopy;  Laterality: N/A;  . HERNIA REPAIR     in childhood  . INGUINAL HERNIA REPAIR Right 03/29/2018   Procedure: OPEN REPAIR OF RIGHT INGUINAL HERNIA WITH MESH;  Surgeon: Judeth Horn, MD;  Location: [place];  Service: General;  Laterality: Right;  . INSERTION OF MESH N/A 03/29/2018   Procedure: INSERTION OF MESH;  Surgeon: Judeth Horn, MD;  Location: [place];  Service: General;  Laterality: N/A;  . IR GENERIC HISTORICAL  05/14/2016   IR US GUIDE VASC ACCESS RIGHT 05/14/2016 Corrie Mckusick, DO [facility]  [facility]  . IR GENERIC HISTORICAL  05/14/2016   IR FLUORO GUIDE PORT INSERTION RIGHT 05/14/2016 Corrie Mckusick, DO [facility]  . IR REMOVAL TUN ACCESS W/ PORT W/O FL MOD SED  11/11/2016  . PARTIAL COLECTOMY N/A 04/10/2016   Procedure: SIGMOID  COLECTOMY WITH COLOSTOMY;  Surgeon: Judeth Horn, MD;  Location: [place];  Service: General;  Laterality: N/A;  . TONSILLECTOMY      No current facility-administered medications for this encounter.   Current Outpatient Medications  Medication Sig Dispense Refill Last Dose  . acetaminophen (TYLENOL 8 HOUR ARTHRITIS PAIN) 650 MG CR tablet Take 650 mg by mouth every morning.      Marland Kitchen amLODipine (NORVASC) 10 MG tablet Take 10 mg by mouth daily.  0   . DM-APAP-CPM (CORICIDIN HBP PO) Take 1 tablet by mouth daily as needed (allergies).      . labetalol (NORMODYNE) 200 MG tablet Take 200 mg by mouth 3 (three) times daily.   0   . lisinopril (ZESTRIL) 5 MG tablet Take 5 mg by mouth daily.     Marland Kitchen loratadine (CLARITIN) 10 MG tablet Take 10 mg by mouth daily as needed for allergies.      . Omega-3 Fatty Acids (FISH OIL) 1200 MG CAPS Take 3,600 mg by mouth daily.       No Known Allergies   Social History   Tobacco Use  . Smoking status: Never Smoker  . Smokeless tobacco: Never Used  Substance Use Topics  . Alcohol use: Yes    Alcohol/week: 1.0 - 2.0 standard drinks    Types: 1 - 2 Glasses of wine per week    Comment: once a week    Family History  Problem Relation Age of Onset  . Hyperlipidemia Father   . Diverticulitis Brother   . Colon cancer Neg Hx   . Esophageal cancer Neg Hx   . Rectal cancer Neg Hx   . Stomach cancer Neg Hx      Review of Systems  Constitutional: Negative.   HENT: Negative.   Eyes: Negative.   Respiratory: Negative.   Cardiovascular: Negative.   Gastrointestinal: Negative.   Genitourinary: Negative.   Musculoskeletal: Positive for joint pain.  Skin: Negative.   Neurological: Negative.   Endo/Heme/Allergies: Positive for  environmental allergies.  Psychiatric/Behavioral: Negative.      Objective:  Physical Exam  Constitutional: He is oriented to person, place, and time. He appears well-developed.  HENT:  Head: Normocephalic.  Eyes: Pupils are equal, round, and reactive to light.  Neck: No JVD present. No tracheal deviation present. No thyromegaly present.  Cardiovascular: Normal rate, regular rhythm and intact distal pulses.  Murmur heard. Respiratory: Effort normal and breath sounds normal. No respiratory distress. He has no wheezes.  GI: Soft. There is no abdominal  tenderness. There is no guarding.  Musculoskeletal:     Cervical back: Neck supple.     Left knee: Swelling and bony tenderness present. No deformity, erythema, ecchymosis or lacerations. Tenderness present.  Lymphadenopathy:    He has no cervical adenopathy.  Neurological: He is alert and oriented to person, place, and time.  Skin: Skin is warm and dry.  Psychiatric: He has a normal mood and affect.      Labs:  Estimated body mass index is 29.58 kg/m as calculated from the following:   Height as of 09/20/19: 6\' 4"  (1.93 m).   Weight as of 09/20/19: 110.2 kg.   Imaging Review Plain radiographs demonstrate severe degenerative joint disease of the left knee(s).  The bone quality appears to be good for age and reported activity level.      Assessment/Plan:  End stage arthritis, left knee   The patient history, physical examination, clinical judgment of the provider and imaging studies are consistent with end stage degenerative joint disease of the left knee(s) and total knee arthroplasty is deemed medically necessary. The treatment options including medical management, injection therapy arthroscopy and arthroplasty were discussed at length. The risks and benefits of total knee arthroplasty were presented and reviewed. The risks due to aseptic loosening, infection, stiffness, patella tracking problems, thromboembolic  complications and other imponderables were discussed. The patient acknowledged the explanation, agreed to proceed with the plan and consent was signed. Patient is being admitted for treatment for surgery, pain control, PT, OT, prophylactic antibiotics, VTE prophylaxis, progressive ambulation and ADL's and discharge planning. The patient is planning to be discharged home.     Patient's anticipated LOS is less than 2 midnights, meeting these requirements: - Lives within 1 hour of care - Has a competent adult at home to recover with post-op recover - NO history of  - Chronic pain requiring opiods  - Diabetes  - Heart failure  - Heart attack  - Stroke  - DVT/VTE  - Cardiac arrhythmia  - Respiratory Failure/COPD  - Anemia  - Advanced Liver disease    West Pugh. Robertt Buda   PA-C  09/27/2019, 10:46 AM

## 2019-09-28 ENCOUNTER — Encounter (HOSPITAL_COMMUNITY): Payer: Self-pay | Admitting: Orthopedic Surgery

## 2019-09-29 ENCOUNTER — Other Ambulatory Visit: Payer: Self-pay

## 2019-09-29 ENCOUNTER — Encounter (HOSPITAL_COMMUNITY)
Admission: RE | Disposition: A | Discharge status: Home / Self Care | Payer: Self-pay | Source: Ambulatory Visit | Attending: Orthopedic Surgery

## 2019-09-29 ENCOUNTER — Observation Stay (HOSPITAL_COMMUNITY)
Admission: RE | Admit: 2019-09-29 | Discharge: 2019-09-30 | Disposition: A | Discharge status: Home / Self Care | Payer: Medicare Other | Source: Ambulatory Visit | Attending: Orthopedic Surgery | Admitting: Orthopedic Surgery

## 2019-09-29 ENCOUNTER — Ambulatory Visit (HOSPITAL_COMMUNITY): Payer: Medicare Other | Admitting: Certified Registered Nurse Anesthetist

## 2019-09-29 ENCOUNTER — Ambulatory Visit (HOSPITAL_COMMUNITY): Payer: Medicare Other | Admitting: Physician Assistant

## 2019-09-29 ENCOUNTER — Encounter (HOSPITAL_COMMUNITY): Payer: Self-pay | Admitting: Orthopedic Surgery

## 2019-09-29 DIAGNOSIS — Z85038 Personal history of other malignant neoplasm of large intestine: Secondary | ICD-10-CM | POA: Insufficient documentation

## 2019-09-29 DIAGNOSIS — Z9049 Acquired absence of other specified parts of digestive tract: Secondary | ICD-10-CM | POA: Diagnosis not present

## 2019-09-29 DIAGNOSIS — I708 Atherosclerosis of other arteries: Secondary | ICD-10-CM | POA: Diagnosis not present

## 2019-09-29 DIAGNOSIS — N182 Chronic kidney disease, stage 2 (mild): Secondary | ICD-10-CM | POA: Insufficient documentation

## 2019-09-29 DIAGNOSIS — I700 Atherosclerosis of aorta: Secondary | ICD-10-CM | POA: Insufficient documentation

## 2019-09-29 DIAGNOSIS — M1712 Unilateral primary osteoarthritis, left knee: Secondary | ICD-10-CM | POA: Diagnosis not present

## 2019-09-29 DIAGNOSIS — I1310 Hypertensive heart and chronic kidney disease without heart failure, with stage 1 through stage 4 chronic kidney disease, or unspecified chronic kidney disease: Secondary | ICD-10-CM | POA: Diagnosis not present

## 2019-09-29 DIAGNOSIS — Z79899 Other long term (current) drug therapy: Secondary | ICD-10-CM | POA: Insufficient documentation

## 2019-09-29 DIAGNOSIS — G8918 Other acute postprocedural pain: Secondary | ICD-10-CM | POA: Diagnosis not present

## 2019-09-29 DIAGNOSIS — J302 Other seasonal allergic rhinitis: Secondary | ICD-10-CM | POA: Diagnosis not present

## 2019-09-29 DIAGNOSIS — I10 Essential (primary) hypertension: Secondary | ICD-10-CM | POA: Diagnosis not present

## 2019-09-29 DIAGNOSIS — Z9221 Personal history of antineoplastic chemotherapy: Secondary | ICD-10-CM | POA: Diagnosis not present

## 2019-09-29 DIAGNOSIS — Z96652 Presence of left artificial knee joint: Secondary | ICD-10-CM

## 2019-09-29 DIAGNOSIS — I447 Left bundle-branch block, unspecified: Secondary | ICD-10-CM | POA: Diagnosis not present

## 2019-09-29 HISTORY — PX: TOTAL KNEE ARTHROPLASTY: SHX125

## 2019-09-29 LAB — TYPE AND SCREEN
ABO/RH(D): A POS
Antibody Screen: NEGATIVE

## 2019-09-29 SURGERY — ARTHROPLASTY, KNEE, TOTAL
Anesthesia: Spinal | Site: Knee | Laterality: Left

## 2019-09-29 MED ORDER — ONDANSETRON HCL 4 MG/2ML IJ SOLN
INTRAMUSCULAR | Status: DC | PRN
  Administered 2019-09-29: 4 mg via INTRAVENOUS

## 2019-09-29 MED ORDER — MIDAZOLAM HCL 2 MG/2ML IJ SOLN
INTRAMUSCULAR | Status: AC
  Administered 2019-09-29: 1 mg via INTRAVENOUS
  Filled 2019-09-29: qty 2

## 2019-09-29 MED ORDER — POLYETHYLENE GLYCOL 3350 17 G PO PACK
17.0000 g | PACK | Freq: Two times a day (BID) | ORAL | Status: DC
  Filled 2019-09-29 (×2): qty 1

## 2019-09-29 MED ORDER — DOCUSATE SODIUM 100 MG PO CAPS
100.0000 mg | ORAL_CAPSULE | Freq: Two times a day (BID) | ORAL | Status: DC
  Administered 2019-09-30: 100 mg via ORAL
  Filled 2019-09-29 (×2): qty 1

## 2019-09-29 MED ORDER — FERROUS SULFATE 325 (65 FE) MG PO TABS
325.0000 mg | ORAL_TABLET | Freq: Two times a day (BID) | ORAL | Status: DC
  Administered 2019-09-29 – 2019-09-30 (×2): 325 mg via ORAL
  Filled 2019-09-29 (×2): qty 1

## 2019-09-29 MED ORDER — SODIUM CHLORIDE 0.9 % IR SOLN
Status: DC | PRN
  Administered 2019-09-29: 1000 mL

## 2019-09-29 MED ORDER — PHENYLEPHRINE HCL (PRESSORS) 10 MG/ML IV SOLN
INTRAVENOUS | Status: AC
  Filled 2019-09-29: qty 1

## 2019-09-29 MED ORDER — ONDANSETRON HCL 4 MG/2ML IJ SOLN: 4.0000 mg | Freq: Four times a day (QID) | INTRAMUSCULAR | Status: DC | PRN

## 2019-09-29 MED ORDER — LABETALOL HCL 100 MG PO TABS
200.0000 mg | ORAL_TABLET | Freq: Three times a day (TID) | ORAL | Status: DC
  Administered 2019-09-29 – 2019-09-30 (×3): 200 mg via ORAL
  Filled 2019-09-29 (×3): qty 2

## 2019-09-29 MED ORDER — POVIDONE-IODINE 10 % EX SWAB
2.0000 "application " | Freq: Once | CUTANEOUS | Status: AC
  Administered 2019-09-29: 2 via TOPICAL

## 2019-09-29 MED ORDER — METHOCARBAMOL 500 MG PO TABS: 500.0000 mg | ORAL_TABLET | Freq: Four times a day (QID) | ORAL | Status: DC | PRN

## 2019-09-29 MED ORDER — PROPOFOL 10 MG/ML IV BOLUS
INTRAVENOUS | Status: AC
  Filled 2019-09-29: qty 20

## 2019-09-29 MED ORDER — ONDANSETRON HCL 4 MG PO TABS: 4.0000 mg | ORAL_TABLET | Freq: Four times a day (QID) | ORAL | Status: DC | PRN

## 2019-09-29 MED ORDER — FENTANYL CITRATE (PF) 100 MCG/2ML IJ SOLN
INTRAMUSCULAR | Status: DC | PRN
  Administered 2019-09-29: 50 ug via INTRAVENOUS

## 2019-09-29 MED ORDER — METOCLOPRAMIDE HCL 5 MG PO TABS: 5.0000 mg | ORAL_TABLET | Freq: Three times a day (TID) | ORAL | Status: DC | PRN

## 2019-09-29 MED ORDER — BISACODYL 10 MG RE SUPP: 10.0000 mg | Freq: Every day | RECTAL | Status: DC | PRN

## 2019-09-29 MED ORDER — PROPOFOL 500 MG/50ML IV EMUL
INTRAVENOUS | Status: AC
  Filled 2019-09-29: qty 50

## 2019-09-29 MED ORDER — METHOCARBAMOL 1000 MG/10ML IJ SOLN
500.0000 mg | Freq: Four times a day (QID) | INTRAVENOUS | Status: DC | PRN
  Filled 2019-09-29: qty 5

## 2019-09-29 MED ORDER — LACTATED RINGERS IV SOLN: INTRAVENOUS | Status: DC

## 2019-09-29 MED ORDER — AMLODIPINE BESYLATE 10 MG PO TABS
10.0000 mg | ORAL_TABLET | Freq: Every day | ORAL | Status: DC
  Administered 2019-09-30: 10 mg via ORAL
  Filled 2019-09-29: qty 1

## 2019-09-29 MED ORDER — DEXAMETHASONE SODIUM PHOSPHATE 10 MG/ML IJ SOLN
10.0000 mg | Freq: Once | INTRAMUSCULAR | Status: AC
  Administered 2019-09-29: 10 mg via INTRAVENOUS

## 2019-09-29 MED ORDER — DEXAMETHASONE SODIUM PHOSPHATE 10 MG/ML IJ SOLN
INTRAMUSCULAR | Status: AC
  Filled 2019-09-29: qty 1

## 2019-09-29 MED ORDER — ASPIRIN 81 MG PO CHEW
81.0000 mg | CHEWABLE_TABLET | Freq: Two times a day (BID) | ORAL | Status: DC
  Administered 2019-09-29 – 2019-09-30 (×2): 81 mg via ORAL
  Filled 2019-09-29 (×2): qty 1

## 2019-09-29 MED ORDER — SODIUM CHLORIDE (PF) 0.9 % IJ SOLN
INTRAMUSCULAR | Status: DC | PRN
  Administered 2019-09-29: 30 mL

## 2019-09-29 MED ORDER — BUPIVACAINE-EPINEPHRINE (PF) 0.25% -1:200000 IJ SOLN
INTRAMUSCULAR | Status: AC
  Filled 2019-09-29: qty 30

## 2019-09-29 MED ORDER — STERILE WATER FOR IRRIGATION IR SOLN
Status: DC | PRN
  Administered 2019-09-29: 2000 mL

## 2019-09-29 MED ORDER — CEFAZOLIN SODIUM-DEXTROSE 2-4 GM/100ML-% IV SOLN
2.0000 g | Freq: Four times a day (QID) | INTRAVENOUS | Status: AC
  Administered 2019-09-29 (×2): 2 g via INTRAVENOUS
  Filled 2019-09-29 (×2): qty 100

## 2019-09-29 MED ORDER — LORATADINE 10 MG PO TABS: 10.0000 mg | ORAL_TABLET | Freq: Every day | ORAL | Status: DC | PRN

## 2019-09-29 MED ORDER — MIDAZOLAM HCL 2 MG/2ML IJ SOLN: 1.0000 mg | INTRAMUSCULAR | Status: DC

## 2019-09-29 MED ORDER — ALUM & MAG HYDROXIDE-SIMETH 200-200-20 MG/5ML PO SUSP: 15.0000 mL | ORAL | Status: DC | PRN

## 2019-09-29 MED ORDER — DIPHENHYDRAMINE HCL 12.5 MG/5ML PO ELIX: 12.5000 mg | ORAL_SOLUTION | ORAL | Status: DC | PRN

## 2019-09-29 MED ORDER — CEFAZOLIN SODIUM-DEXTROSE 2-4 GM/100ML-% IV SOLN
2.0000 g | INTRAVENOUS | Status: AC
  Administered 2019-09-29: 2 g via INTRAVENOUS
  Filled 2019-09-29: qty 100

## 2019-09-29 MED ORDER — BUPIVACAINE-EPINEPHRINE (PF) 0.25% -1:200000 IJ SOLN
INTRAMUSCULAR | Status: DC | PRN
  Administered 2019-09-29: 30 mL

## 2019-09-29 MED ORDER — TRANEXAMIC ACID-NACL 1000-0.7 MG/100ML-% IV SOLN
1000.0000 mg | INTRAVENOUS | Status: AC
  Administered 2019-09-29: 1000 mg via INTRAVENOUS
  Filled 2019-09-29: qty 100

## 2019-09-29 MED ORDER — FENTANYL CITRATE (PF) 100 MCG/2ML IJ SOLN
INTRAMUSCULAR | Status: AC
  Filled 2019-09-29: qty 2

## 2019-09-29 MED ORDER — FENTANYL CITRATE (PF) 100 MCG/2ML IJ SOLN: 50.0000 ug | INTRAMUSCULAR | Status: DC

## 2019-09-29 MED ORDER — PROPOFOL 500 MG/50ML IV EMUL
INTRAVENOUS | Status: DC | PRN
  Administered 2019-09-29: 50 ug/kg/min via INTRAVENOUS

## 2019-09-29 MED ORDER — KETOROLAC TROMETHAMINE 30 MG/ML IJ SOLN
INTRAMUSCULAR | Status: DC | PRN
  Administered 2019-09-29: 30 mg

## 2019-09-29 MED ORDER — TRANEXAMIC ACID-NACL 1000-0.7 MG/100ML-% IV SOLN
1000.0000 mg | Freq: Once | INTRAVENOUS | Status: AC
  Administered 2019-09-29: 1000 mg via INTRAVENOUS
  Filled 2019-09-29: qty 100

## 2019-09-29 MED ORDER — DEXAMETHASONE SODIUM PHOSPHATE 10 MG/ML IJ SOLN
10.0000 mg | Freq: Once | INTRAMUSCULAR | Status: AC
  Administered 2019-09-30: 10 mg via INTRAVENOUS
  Filled 2019-09-29: qty 1

## 2019-09-29 MED ORDER — PHENYLEPHRINE HCL-NACL 10-0.9 MG/250ML-% IV SOLN
INTRAVENOUS | Status: DC | PRN
  Administered 2019-09-29: 20 ug/min via INTRAVENOUS

## 2019-09-29 MED ORDER — PROPOFOL 10 MG/ML IV BOLUS
INTRAVENOUS | Status: DC | PRN
  Administered 2019-09-29 (×2): 20 mg via INTRAVENOUS
  Administered 2019-09-29: 30 mg via INTRAVENOUS

## 2019-09-29 MED ORDER — PHENOL 1.4 % MT LIQD: 1.0000 | OROMUCOSAL | Status: DC | PRN

## 2019-09-29 MED ORDER — ONDANSETRON HCL 4 MG/2ML IJ SOLN
INTRAMUSCULAR | Status: AC
  Filled 2019-09-29: qty 2

## 2019-09-29 MED ORDER — ACETAMINOPHEN 325 MG PO TABS
325.0000 mg | ORAL_TABLET | Freq: Four times a day (QID) | ORAL | Status: DC | PRN
  Filled 2019-09-29: qty 2

## 2019-09-29 MED ORDER — MENTHOL 3 MG MT LOZG: 1.0000 | LOZENGE | OROMUCOSAL | Status: DC | PRN

## 2019-09-29 MED ORDER — CELECOXIB 200 MG PO CAPS
200.0000 mg | ORAL_CAPSULE | Freq: Two times a day (BID) | ORAL | Status: DC
  Administered 2019-09-29 – 2019-09-30 (×2): 200 mg via ORAL
  Filled 2019-09-29 (×2): qty 1

## 2019-09-29 MED ORDER — MAGNESIUM CITRATE PO SOLN: 1.0000 | Freq: Once | ORAL | Status: DC | PRN

## 2019-09-29 MED ORDER — BUPIVACAINE-EPINEPHRINE (PF) 0.5% -1:200000 IJ SOLN
INTRAMUSCULAR | Status: DC | PRN
  Administered 2019-09-29: 15 mL via PERINEURAL

## 2019-09-29 MED ORDER — SODIUM CHLORIDE (PF) 0.9 % IJ SOLN
INTRAMUSCULAR | Status: AC
  Filled 2019-09-29: qty 50

## 2019-09-29 MED ORDER — HYDROCODONE-ACETAMINOPHEN 7.5-325 MG PO TABS: 1.0000 | ORAL_TABLET | ORAL | Status: DC | PRN

## 2019-09-29 MED ORDER — FENTANYL CITRATE (PF) 100 MCG/2ML IJ SOLN
INTRAMUSCULAR | Status: AC
  Administered 2019-09-29: 50 ug via INTRAVENOUS
  Filled 2019-09-29: qty 2

## 2019-09-29 MED ORDER — 0.9 % SODIUM CHLORIDE (POUR BTL) OPTIME
TOPICAL | Status: DC | PRN
  Administered 2019-09-29: 1000 mL

## 2019-09-29 MED ORDER — HYDROCODONE-ACETAMINOPHEN 5-325 MG PO TABS
1.0000 | ORAL_TABLET | ORAL | Status: DC | PRN
  Administered 2019-09-29 – 2019-09-30 (×2): 1 via ORAL
  Filled 2019-09-29 (×2): qty 1

## 2019-09-29 MED ORDER — METOCLOPRAMIDE HCL 5 MG/ML IJ SOLN: 5.0000 mg | Freq: Three times a day (TID) | INTRAMUSCULAR | Status: DC | PRN

## 2019-09-29 MED ORDER — BUPIVACAINE IN DEXTROSE 0.75-8.25 % IT SOLN
INTRATHECAL | Status: DC | PRN
  Administered 2019-09-29: 1.8 mL via INTRATHECAL

## 2019-09-29 MED ORDER — KETOROLAC TROMETHAMINE 30 MG/ML IJ SOLN
INTRAMUSCULAR | Status: AC
  Filled 2019-09-29: qty 1

## 2019-09-29 MED ORDER — SODIUM CHLORIDE 0.9 % IV SOLN: INTRAVENOUS | Status: DC

## 2019-09-29 MED ORDER — HYDROMORPHONE HCL 1 MG/ML IJ SOLN: 0.5000 mg | INTRAMUSCULAR | Status: DC | PRN

## 2019-09-29 SURGICAL SUPPLY — 63 items
ATTUNE MED ANAT PAT 41 KNEE (Knees) ×2 IMPLANT
ATTUNE MED ANAT PAT 41MM KNEE (Knees) ×1 IMPLANT
ATTUNE PS FEM LT SZ 6 CEM KNEE (Femur) ×3 IMPLANT
ATTUNE PSRP INSR SZ6 8 KNEE (Insert) ×2 IMPLANT
ATTUNE PSRP INSR SZ6 8MM KNEE (Insert) ×1 IMPLANT
BAG ZIPLOCK 12X15 (MISCELLANEOUS) IMPLANT
BASE TIBIAL ROT PLAT SZ 7 KNEE (Knees) ×1 IMPLANT
BLADE SAW SGTL 11.0X1.19X90.0M (BLADE) IMPLANT
BLADE SAW SGTL 13.0X1.19X90.0M (BLADE) ×3 IMPLANT
BLADE SURG SZ10 CARB STEEL (BLADE) ×6 IMPLANT
BNDG ELASTIC 6X5.8 VLCR STR LF (GAUZE/BANDAGES/DRESSINGS) ×3 IMPLANT
BOWL SMART MIX CTS (DISPOSABLE) ×3 IMPLANT
CEMENT HV SMART SET (Cement) ×6 IMPLANT
COVER SURGICAL LIGHT HANDLE (MISCELLANEOUS) ×3 IMPLANT
COVER WAND RF STERILE (DRAPES) IMPLANT
CUFF TOURN SGL QUICK 34 (TOURNIQUET CUFF) ×3
CUFF TRNQT CYL 34X4.125X (TOURNIQUET CUFF) ×1 IMPLANT
DECANTER SPIKE VIAL GLASS SM (MISCELLANEOUS) ×6 IMPLANT
DERMABOND ADVANCED (GAUZE/BANDAGES/DRESSINGS) ×2
DERMABOND ADVANCED .7 DNX12 (GAUZE/BANDAGES/DRESSINGS) ×1 IMPLANT
DRAPE U-SHAPE 47X51 STRL (DRAPES) ×3 IMPLANT
DRESSING AQUACEL AG SP 3.5X10 (GAUZE/BANDAGES/DRESSINGS) ×1 IMPLANT
DRSG AQUACEL AG ADV 3.5X10 (GAUZE/BANDAGES/DRESSINGS) ×3 IMPLANT
DRSG AQUACEL AG SP 3.5X10 (GAUZE/BANDAGES/DRESSINGS) ×3
DURAPREP 26ML APPLICATOR (WOUND CARE) ×6 IMPLANT
ELECT REM PT RETURN 15FT ADLT (MISCELLANEOUS) ×3 IMPLANT
GLOVE BIO SURGEON STRL SZ 6 (GLOVE) ×3 IMPLANT
GLOVE BIOGEL PI IND STRL 6.5 (GLOVE) ×1 IMPLANT
GLOVE BIOGEL PI IND STRL 7.5 (GLOVE) ×1 IMPLANT
GLOVE BIOGEL PI IND STRL 8.5 (GLOVE) ×1 IMPLANT
GLOVE BIOGEL PI INDICATOR 6.5 (GLOVE) ×2
GLOVE BIOGEL PI INDICATOR 7.5 (GLOVE) ×2
GLOVE BIOGEL PI INDICATOR 8.5 (GLOVE) ×2
GLOVE ECLIPSE 8.0 STRL XLNG CF (GLOVE) ×3 IMPLANT
GLOVE ORTHO TXT STRL SZ7.5 (GLOVE) ×3 IMPLANT
GOWN STRL REUS W/ TWL LRG LVL3 (GOWN DISPOSABLE) ×1 IMPLANT
GOWN STRL REUS W/TWL 2XL LVL3 (GOWN DISPOSABLE) ×3 IMPLANT
GOWN STRL REUS W/TWL LRG LVL3 (GOWN DISPOSABLE) ×6 IMPLANT
HANDPIECE INTERPULSE COAX TIP (DISPOSABLE) ×3
HOLDER FOLEY CATH W/STRAP (MISCELLANEOUS) IMPLANT
KIT TURNOVER KIT A (KITS) IMPLANT
MANIFOLD NEPTUNE II (INSTRUMENTS) ×3 IMPLANT
NDL SAFETY ECLIPSE 18X1.5 (NEEDLE) IMPLANT
NEEDLE HYPO 18GX1.5 SHARP (NEEDLE)
NS IRRIG 1000ML POUR BTL (IV SOLUTION) ×3 IMPLANT
PACK TOTAL KNEE CUSTOM (KITS) ×3 IMPLANT
PENCIL SMOKE EVACUATOR (MISCELLANEOUS) IMPLANT
PIN DRILL FIX HALF THREAD (BIT) ×3 IMPLANT
PIN FIX SIGMA LCS THRD HI (PIN) ×3 IMPLANT
PROTECTOR NERVE ULNAR (MISCELLANEOUS) ×3 IMPLANT
SET HNDPC FAN SPRY TIP SCT (DISPOSABLE) ×1 IMPLANT
SET PAD KNEE POSITIONER (MISCELLANEOUS) ×3 IMPLANT
SUT MNCRL AB 4-0 PS2 18 (SUTURE) ×3 IMPLANT
SUT STRATAFIX PDS+ 0 24IN (SUTURE) ×3 IMPLANT
SUT VIC AB 1 CT1 36 (SUTURE) ×3 IMPLANT
SUT VIC AB 2-0 CT1 27 (SUTURE) ×9
SUT VIC AB 2-0 CT1 TAPERPNT 27 (SUTURE) ×3 IMPLANT
SYR 3ML LL SCALE MARK (SYRINGE) ×3 IMPLANT
TIBIAL BASE ROT PLAT SZ 7 KNEE (Knees) ×3 IMPLANT
TRAY FOLEY MTR SLVR 16FR STAT (SET/KITS/TRAYS/PACK) ×3 IMPLANT
WATER STERILE IRR 1000ML POUR (IV SOLUTION) ×6 IMPLANT
WRAP KNEE MAXI GEL POST OP (GAUZE/BANDAGES/DRESSINGS) ×3 IMPLANT
YANKAUER SUCT BULB TIP 10FT TU (MISCELLANEOUS) ×3 IMPLANT

## 2019-09-29 NOTE — Care Plan (Signed)
Ortho Bundle Case Management Note  Patient Details  Name: Louis [name] MRN: [id number] Date of Birth: 1945/08/14  L TKA on 09-29-19 DCP: Home with wife. 2 story home with 5 ste. DME:  RW and 3-in-1 ordered through [place] PT:  [facility].  PT eval scheduled on 10-04-19                   DME Arranged:  Walker rolling, 3-N-1 DME Agency:  [employer]  HH Arranged:  NA HH Agency:  NA  Additional Comments: Please contact me with any questions of if this plan should need to change.  Marianne Sofia, RN,CCM [facility]  [phone] 09/29/2019, 9:01 AM

## 2019-09-29 NOTE — Progress Notes (Signed)
Assisted Dr. Daiva Huge with left, ultrasound guided, adductor canal block. Side rails up, monitors on throughout procedure. See vital signs in flow sheet. Tolerated Procedure well.

## 2019-09-29 NOTE — Evaluation (Signed)
Physical Therapy Evaluation Patient Details Name: Louis [name] MRN: [id number] DOB: [birth year] Today's Date: 09/29/2019   History of Present Illness  Patient is 74 y.o. male s/p Lt TKA on 09/29/19 with PMH significant for HTN, HLD, CKD, OA, neuropathy.  Clinical Impression  Louis [name] is a 74 y.o. male POD 0 s/p Lt TKA. Patient reports independence with mobility at baseline. Patient is now limited by functional impairments (see PT problem list below) and requires min assist for transfers and gait with RW. Patient was able to ambulate ~60 feet with RW and min assist. Patient instructed in exercise to facilitate ROM and circulation. Patient will benefit from continued skilled PT interventions to address impairments and progress towards PLOF. Acute PT will follow to progress mobility and stair training in preparation for safe discharge home.     Follow Up Recommendations Follow surgeon's recommendation for DC plan and follow-up therapies;Outpatient PT    Equipment Recommendations  Rolling walker with 5" wheels    Recommendations for Other Services       Precautions / Restrictions Precautions Precautions: Fall Restrictions Weight Bearing Restrictions: No LLE Weight Bearing: Weight bearing as tolerated      Mobility  Bed Mobility Overal bed mobility: Needs Assistance Bed Mobility: Supine to Sit     Supine to sit: Min guard;HOB elevated     General bed mobility comments: cues to use bed rail, no assist required  Transfers Overall transfer level: Needs assistance Equipment used: Rolling walker (2 wheeled) Transfers: Sit to/from Stand Sit to Stand: Min assist;From elevated surface         General transfer comment: cues for hand placement, assist for power up and to steady.  Ambulation/Gait Ambulation/Gait assistance: Min assist Gait Distance (Feet): 60 Feet Assistive device: Rolling walker (2 wheeled) Gait Pattern/deviations: Step-to pattern;Decreased stance time  - left;Decreased stride length;Decreased weight shift to left;Trunk flexed     General Gait Details: cues for posture and for safe step pattern and proximity to RW. assist intermittently for safe walker management with turning.  Stairs            Wheelchair Mobility    Modified Rankin (Stroke Patients Only)       Balance Overall balance assessment: Needs assistance Sitting-balance support: Feet supported Sitting balance-Leahy Scale: Good     Standing balance support: During functional activity;Bilateral upper extremity supported Standing balance-Leahy Scale: Fair              Pertinent Vitals/Pain Pain Assessment: 0-10 Pain Score: 1  Pain Location: Lt knee Pain Descriptors / Indicators: Discomfort Pain Intervention(s): Limited activity within patient's tolerance;Monitored during session;Repositioned;Ice applied    Home Living Family/patient expects to be discharged to:: Private residence Living Arrangements: Spouse/significant other Available Help at Discharge: Family Type of Home: House Home Access: Stairs to enter Entrance Stairs-Rails: (P) Left;Right Entrance Stairs-Number of Steps: 5-6 Home Layout: Two level;Full bath on main level;Able to live on main level with bedroom/bathroom Home Equipment: (P) Grab bars - tub/shower;Grab bars - toilet      Prior Function Level of Independence: Independent               Hand Dominance   Dominant Hand: Left    Extremity/Trunk Assessment   Upper Extremity Assessment Upper Extremity Assessment: Overall WFL for tasks assessed    Lower Extremity Assessment Lower Extremity Assessment: LLE deficits/detail LLE Deficits / Details: good quad activation, no extensor lag with SLR LLE Sensation: WNL LLE Coordination: WNL    Cervical /  Trunk Assessment Cervical / Trunk Assessment: Normal  Communication   Communication: No difficulties  Cognition Arousal/Alertness: Awake/alert Behavior During Therapy: WFL  for tasks assessed/performed Overall Cognitive Status: Within Functional Limits for tasks assessed         General Comments      Exercises Total Joint Exercises Ankle Circles/Pumps: Both;20 reps;Seated;AROM Quad Sets: AROM;Left;10 reps;Seated Heel Slides: AROM;Left;10 reps;Seated   Assessment/Plan    PT Assessment Patient needs continued PT services  PT Problem List Decreased range of motion;Decreased strength;Decreased activity tolerance;Decreased balance;Decreased mobility;Decreased knowledge of use of DME       PT Treatment Interventions Gait training;DME instruction;Stair training;Functional mobility training;Therapeutic activities;Balance training;Therapeutic exercise;Patient/family education    PT Goals (Current goals can be found in the Care Plan section)  Acute Rehab PT Goals Patient Stated Goal: get back to independence PT Goal Formulation: With patient Time For Goal Achievement: 10/06/19    Frequency 7X/week    AM-PAC PT "6 Clicks" Mobility  Outcome Measure Help needed turning from your back to your side while in a flat bed without using bedrails?: None Help needed moving from lying on your back to sitting on the side of a flat bed without using bedrails?: A Little Help needed moving to and from a bed to a chair (including a wheelchair)?: A Little Help needed standing up from a chair using your arms (e.g., wheelchair or bedside chair)?: A Little Help needed to walk in hospital room?: A Little Help needed climbing 3-5 steps with a railing? : A Little 6 Click Score: 19    End of Session Equipment Utilized During Treatment: Gait belt Activity Tolerance: Patient tolerated treatment well Patient left: in chair;with call bell/phone within reach;with chair alarm set Nurse Communication: Mobility status PT Visit Diagnosis: Muscle weakness (generalized) (M62.81);Difficulty in walking, not elsewhere classified (R26.2)    Time: 0263-7858 PT Time Calculation (min)  (ACUTE ONLY): 33 min   Charges:   PT Evaluation $PT Eval Low Complexity: 1 Low PT Treatments $Therapeutic Exercise: 8-22 mins        Verner Mould, DPT Physical Therapist with [facility] [phone]  09/29/2019 6:32 PM

## 2019-09-29 NOTE — Anesthesia Postprocedure Evaluation (Signed)
Anesthesia Post Note  Patient: Louis [name]  Procedure(s) Performed: TOTAL KNEE ARTHROPLASTY (Left Knee)     Patient location during evaluation: PACU Anesthesia Type: Spinal Level of consciousness: awake and alert and oriented Pain management: pain level controlled Vital Signs Assessment: post-procedure vital signs reviewed and stable Respiratory status: spontaneous breathing, nonlabored ventilation and respiratory function stable Cardiovascular status: blood pressure returned to baseline Postop Assessment: no apparent nausea or vomiting, spinal receding, no headache and no backache Anesthetic complications: no    Last Vitals:  Vitals:   09/29/19 0959 09/29/19 1000  BP:  (!) 166/83  Pulse: 64 64  Resp: 14 17  Temp:    SpO2: 98% 100%    Last Pain:  Vitals:   09/29/19 1003  TempSrc:   PainSc: 0-No pain                 Brennan Bailey

## 2019-09-29 NOTE — Op Note (Signed)
NAME:  Louis [name]                      MEDICAL RECORD NO.:  [id number]                             FACILITY:  [facility]      PHYSICIAN:  Pietro Cassis. Alvan Dame, M.D.  DATE OF BIRTH:  1945-07-25      DATE OF PROCEDURE:  09/29/2019                                     OPERATIVE REPORT         PREOPERATIVE DIAGNOSIS:  Left knee osteoarthritis.      POSTOPERATIVE DIAGNOSIS:  Left knee osteoarthritis.      FINDINGS:  The patient was noted to have complete loss of cartilage and   bone-on-bone arthritis with associated osteophytes in the lateral and patellofemoral compartments of   the knee with a significant synovitis and associated effusion.  Lateral femoral condyle OCD with large loose bodies within the joint.  The patient had failed months of conservative treatment including medications, injection therapy, activity modification.     PROCEDURE:  Left total knee replacement.      COMPONENTS USED:  DePuy Attune rotating platform posterior stabilized knee   system, a size 6 femur, 7 tibia, size size 8 mm PS AOX insert, and 41 anatomic patellar   button.      SURGEON:  Pietro Cassis. Alvan Dame, M.D.      ASSISTANT:  Griffith Citron, PA-C.      ANESTHESIA:  Regional and Spinal.      SPECIMENS:  None.      COMPLICATION:  None.      DRAINS:  None.  EBL: <100 cc      TOURNIQUET TIME:  43 min at 250 mmHg   The patient was stable to the recovery room.      INDICATION FOR PROCEDURE:  Louis [name] is a 74 y.o. male patient of   mine.  The patient had been seen, evaluated, and treated for months conservatively in the   office with medication, activity modification, and injections.  The patient had   radiographic changes of bone-on-bone arthritis with endplate sclerosis and osteophytes noted.  Based on the radiographic changes and failed conservative measures, the patient   decided to proceed with definitive treatment, total knee replacement.  Risks of infection, DVT, component failure, need for  revision surgery, neurovascular injury were reviewed in the office setting.  The postop course was reviewed stressing the efforts to maximize post-operative satisfaction and function.  Consent was obtained for benefit of pain   relief.      PROCEDURE IN DETAIL:  The patient was brought to the operative theater.   Once adequate anesthesia, preoperative antibiotics, 2 gm of Ancef,1 gm of Tranexamic Acid, and 10 mg of Decadron administered, the patient was positioned supine with a left thigh tourniquet placed.  The  left lower extremity was prepped and draped in sterile fashion.  A time-   out was performed identifying the patient, planned procedure, and the appropriate extremity.      The left lower extremity was placed in the [facility] leg holder.  The leg was   exsanguinated, tourniquet elevated to 250 mmHg.  A midline incision was   made followed by  median parapatellar arthrotomy.  Following initial   exposure, attention was first directed to the patella.  Precut   measurement was noted to be 25 mm.  I resected down to 14 mm and used a   41 anatomic patellar button to restore patellar height as well as cover the cut surface.      The lug holes were drilled and a metal shim was placed to protect the   patella from retractors and saw blade during the procedure.      At this point, attention was now directed to the femur.  The femoral   canal was opened with a drill, irrigated to try to prevent fat emboli.  An   intramedullary rod was passed at 5 degrees valgus, 9 mm of bone was   resected off the distal femur.  I at this point debrided the large distal femoral cyst and non viable bone from the area of the avascular region.  Following this resection, the tibia was   subluxated anteriorly.  Using the extramedullary guide, 2 mm of bone was resected off   the proximal medial tibia.  We confirmed the gap would be   stable medially and laterally with a size 5 spacer block as well as confirmed that the  tibial cut was perpendicular in the coronal plane, checking with an alignment rod.      Once this was done, I sized the femur to be a size 6 in the anterior-   posterior dimension, chose a standard component based on medial and   lateral dimension.  The size 6 rotation block was then pinned in   position anterior referenced using the C-clamp to set rotation.  The   anterior, posterior, and  chamfer cuts were made without difficulty nor   notching making certain that I was along the anterior cortex to help   with flexion gap stability.      The final box cut was made off the lateral aspect of distal femur.      At this point, the tibia was sized to be a size 7.  The size 7 tray was   then pinned in position through the medial third of the tubercle,   drilled, and keel punched.  I packed the distal femoral defect with native cancellous bone from prior cuts.  Trial reduction was now carried with a 6 femur,  7 tibia, a size 8 mm PS insert, and the 41 anatomic patella botton.  The knee was brought to full extension with good flexion stability with the patella   tracking through the trochlea without application of pressure.  Given   all these findings the trial components removed.  Final components were   opened and cement was mixed.  The knee was irrigated with normal saline solution and pulse lavage.  The synovial lining was   then injected with 30 cc of 0.25% Marcaine with epinephrine, 1 cc of Toradol and 30 cc of NS for a total of 61 cc.     Final implants were then cemented onto cleaned and dried cut surfaces of bone with the knee brought to extension with a size 8 mm PS trial insert.      Once the cement had fully cured, excess cement was removed   throughout the knee.  I confirmed that I was satisfied with the range of   motion and stability, and the final size 8 mm PS AOX insert was chosen.  It was   placed into the  knee.      The tourniquet had been let down at 43 minutes.  No  significant   hemostasis was required.  The extensor mechanism was then reapproximated using #1 Vicryl and #1 Stratafix sutures with the knee   in flexion.  The   remaining wound was closed with 2-0 Vicryl and running 4-0 Monocryl.   The knee was cleaned, dried, dressed sterilely using Dermabond and   Aquacel dressing.  The patient was then   brought to recovery room in stable condition, tolerating the procedure   well.   Please note that Physician Assistant, Griffith Citron, PA-C was present for the entirety of the case, and was utilized for pre-operative positioning, peri-operative retractor management, general facilitation of the procedure and for primary wound closure at the end of the case.              Pietro Cassis Alvan Dame, M.D.    09/29/2019 10:12 AM

## 2019-09-29 NOTE — Anesthesia Procedure Notes (Signed)
Anesthesia Regional Block: Adductor canal block   Pre-Anesthetic Checklist: ,, timeout performed, Correct Patient, Correct Site, Correct Laterality, Correct Procedure, Correct Position, site marked, Risks and benefits discussed, pre-op evaluation,  At surgeon's request and post-op pain management  Laterality: Left  Prep: Maximum Sterile Barrier Precautions used, chloraprep       Needles:  Injection technique: Single-shot  Needle Type: Echogenic Stimulator Needle     Needle Length: 9cm  Needle Gauge: 22     Additional Needles:   Procedures:,,,, ultrasound used (permanent image in chart),,,,  Narrative:  Start time: 09/29/2019 9:57 AM End time: 09/29/2019 10:00 AM Injection made incrementally with aspirations every 5 mL.  Performed by: Personally  Anesthesiologist: Brennan Bailey, MD  Additional Notes: Risks, benefits, and alternative discussed. Patient gave consent for procedure. Patient prepped and draped in sterile fashion. Sedation administered, patient remains easily responsive to voice. Relevant anatomy identified with ultrasound guidance. Local anesthetic given in 5cc increments with no signs or symptoms of intravascular injection. No pain or paraesthesias with injection. Patient monitored throughout procedure with signs of LAST or immediate complications. Tolerated well. Ultrasound image placed in chart.  Tawny Asal, MD

## 2019-09-29 NOTE — Plan of Care (Signed)

## 2019-09-29 NOTE — Interval H&P Note (Signed)
History and Physical Interval Note:  09/29/2019 9:02 AM  Louis [name]  has presented today for surgery, with the diagnosis of Left knee osteoarthriris.  The various methods of treatment have been discussed with the patient and family. After consideration of risks, benefits and other options for treatment, the patient has consented to  Procedure(s) with comments: TOTAL KNEE ARTHROPLASTY (Left) - 70 mins as a surgical intervention.  The patient's history has been reviewed, patient examined, no change in status, stable for surgery.  I have reviewed the patient's chart and labs.  Questions were answered to the patient's satisfaction.     Mauri Pole

## 2019-09-29 NOTE — Discharge Instructions (Signed)

## 2019-09-29 NOTE — Anesthesia Procedure Notes (Signed)
Spinal  Patient location during procedure: OR Start time: 09/29/2019 10:12 AM End time: 09/29/2019 10:14 AM Staffing Performed: resident/CRNA  Anesthesiologist: Brennan Bailey, MD Resident/CRNA: Montel Clock, CRNA Preanesthetic Checklist Completed: patient identified, IV checked, risks and benefits discussed, surgical consent, monitors and equipment checked, pre-op evaluation and timeout performed Spinal Block Patient position: sitting Prep: DuraPrep Patient monitoring: heart rate, cardiac monitor, continuous pulse ox and blood pressure Approach: midline Location: L3-4 Injection technique: single-shot Needle Needle type: Pencan  Needle gauge: 24 G Needle length: 10 cm Needle insertion depth: 7.5 cm Assessment Sensory level: T6

## 2019-09-29 NOTE — Transfer of Care (Signed)
Immediate Anesthesia Transfer of Care Note  Patient: Louis [name]  Procedure(s) Performed: TOTAL KNEE ARTHROPLASTY (Left Knee)  Patient Location: PACU  Anesthesia Type:Spinal  Level of Consciousness: drowsy and patient cooperative  Airway & Oxygen Therapy: Patient Spontanous Breathing and Patient connected to face mask oxygen  Post-op Assessment: Report given to RN and Post -op Vital signs reviewed and stable  Post vital signs: Reviewed and stable  Last Vitals:  Vitals Value Taken Time  BP 166/80 09/29/19 1404  Temp    Pulse 65 09/29/19 1405  Resp 17 09/29/19 1405  SpO2 100 % 09/29/19 1405  Vitals shown include unvalidated device data.  Last Pain:  Vitals:   09/29/19 1003  TempSrc:   PainSc: 0-No pain         Complications: No apparent anesthesia complications

## 2019-09-30 ENCOUNTER — Encounter: Payer: Self-pay | Admitting: *Deleted

## 2019-09-30 DIAGNOSIS — I708 Atherosclerosis of other arteries: Secondary | ICD-10-CM | POA: Diagnosis not present

## 2019-09-30 DIAGNOSIS — I700 Atherosclerosis of aorta: Secondary | ICD-10-CM | POA: Diagnosis not present

## 2019-09-30 DIAGNOSIS — J302 Other seasonal allergic rhinitis: Secondary | ICD-10-CM | POA: Diagnosis not present

## 2019-09-30 DIAGNOSIS — M1712 Unilateral primary osteoarthritis, left knee: Secondary | ICD-10-CM | POA: Diagnosis not present

## 2019-09-30 DIAGNOSIS — I1310 Hypertensive heart and chronic kidney disease without heart failure, with stage 1 through stage 4 chronic kidney disease, or unspecified chronic kidney disease: Secondary | ICD-10-CM | POA: Diagnosis not present

## 2019-09-30 DIAGNOSIS — N182 Chronic kidney disease, stage 2 (mild): Secondary | ICD-10-CM | POA: Diagnosis not present

## 2019-09-30 MED ORDER — DOCUSATE SODIUM 100 MG PO CAPS
100.0000 mg | ORAL_CAPSULE | Freq: Two times a day (BID) | ORAL | 0 refills | Status: DC
Start: 2019-09-30 — End: 2019-11-29

## 2019-09-30 MED ORDER — ASPIRIN 81 MG PO CHEW
81.0000 mg | CHEWABLE_TABLET | Freq: Two times a day (BID) | ORAL | 0 refills | Status: AC
Start: 2019-09-30 — End: 2019-10-30

## 2019-09-30 MED ORDER — METHOCARBAMOL 500 MG PO TABS: 500.0000 mg | ORAL_TABLET | Freq: Four times a day (QID) | ORAL | 0 refills | Status: DC | PRN

## 2019-09-30 MED ORDER — POLYETHYLENE GLYCOL 3350 17 G PO PACK
17.0000 g | PACK | Freq: Two times a day (BID) | ORAL | 0 refills | Status: DC
Start: 2019-09-30 — End: 2019-11-29

## 2019-09-30 MED ORDER — HYDROCODONE-ACETAMINOPHEN 7.5-325 MG PO TABS: 1.0000 | ORAL_TABLET | ORAL | 0 refills | Status: DC | PRN

## 2019-09-30 MED ORDER — FERROUS SULFATE 325 (65 FE) MG PO TABS: 325.0000 mg | ORAL_TABLET | Freq: Three times a day (TID) | ORAL | 0 refills | Status: DC

## 2019-09-30 NOTE — Progress Notes (Signed)
Note pt is Ortho Bundle - met briefly with him and his wife and have confirmed he has received his rw and 3n1 via [facility] and plan for OPPT at [facility].  No TOC needs.  Jaylah Goodlow, LCSW

## 2019-09-30 NOTE — Progress Notes (Signed)
Physical Therapy Treatment Patient Details Name: Louis [name] MRN: [id number] DOB: 10-Mar-1946 Today's Date: 09/30/2019    History of Present Illness Patient is 74 y.o. male s/p Lt TKA on 09/29/19 with PMH significant for HTN, HLD, CKD, OA, neuropathy.    PT Comments    Pt progressing well; will see again to review HEP/ROM/precautions and progression. Will be ready to d/c later today   Follow Up Recommendations  Follow surgeon's recommendation for DC plan and follow-up therapies;Outpatient PT     Equipment Recommendations  Rolling walker with 5" wheels    Recommendations for Other Services       Precautions / Restrictions Precautions Precautions: Fall Restrictions LLE Weight Bearing: Weight bearing as tolerated    Mobility  Bed Mobility Overal bed mobility: Needs Assistance Bed Mobility: Supine to Sit     Supine to sit: Supervision     General bed mobility comments: for safety   Transfers Overall transfer level: Needs assistance Equipment used: Rolling walker (2 wheeled) Transfers: Sit to/from Stand Sit to Stand: Min guard         General transfer comment: cues for hand placement  Ambulation/Gait Ambulation/Gait assistance: Min guard;Supervision Gait Distance (Feet): 120 Feet Assistive device: Rolling walker (2 wheeled) Gait Pattern/deviations: Step-through pattern;Step-to pattern;Decreased stride length     General Gait Details: cues for RW  safety, especially with turns, mildly unsteady, no overt LOB   Stairs Stairs: Yes Stairs assistance: Min guard Stair Management: Two rails;Step to pattern;Forwards Number of Stairs: 5(x2) General stair comments: cues for sequence and safety; wife present    Wheelchair Mobility    Modified Rankin (Stroke Patients Only)       Balance           Standing balance support: During functional activity;Bilateral upper extremity supported Standing balance-Leahy Scale: Fair(reliant on UEs for dynamic)                               Cognition Arousal/Alertness: Awake/alert Behavior During Therapy: WFL for tasks assessed/performed Overall Cognitive Status: Within Functional Limits for tasks assessed                                        Exercises      General Comments        Pertinent Vitals/Pain Pain Assessment: 0-10 Pain Score: 2  Pain Location: Lt knee Pain Descriptors / Indicators: Discomfort Pain Intervention(s): Limited activity within patient's tolerance;Monitored during session;Repositioned    Home Living                      Prior Function            PT Goals (current goals can now be found in the care plan section) Acute Rehab PT Goals Patient Stated Goal: get back to independence PT Goal Formulation: With patient Time For Goal Achievement: 10/06/19 Progress towards PT goals: Progressing toward goals    Frequency    7X/week      PT Plan Current plan remains appropriate    Co-evaluation              AM-PAC PT "6 Clicks" Mobility   Outcome Measure  Help needed turning from your back to your side while in a flat bed without using bedrails?: None Help needed moving from lying on your back to sitting  on the side of a flat bed without using bedrails?: A Little Help needed moving to and from a bed to a chair (including a wheelchair)?: A Little Help needed standing up from a chair using your arms (e.g., wheelchair or bedside chair)?: A Little Help needed to walk in hospital room?: A Little Help needed climbing 3-5 steps with a railing? : A Little 6 Click Score: 19    End of Session Equipment Utilized During Treatment: Gait belt Activity Tolerance: Patient tolerated treatment well Patient left: in chair;with call bell/phone within reach;with chair alarm set;with family/visitor present Nurse Communication: Mobility status PT Visit Diagnosis: Muscle weakness (generalized) (M62.81);Difficulty in walking, not  elsewhere classified (R26.2)     Time: 6770-3403 PT Time Calculation (min) (ACUTE ONLY): 13 min  Charges:  $Gait Training: 8-22 mins                     Baxter Flattery, PT   Acute Rehab Dept [facility]): [phone]   09/30/2019    [facility] 09/30/2019, 12:33 PM

## 2019-09-30 NOTE — Progress Notes (Signed)
   Subjective: 1 Day Post-Op Procedure(s) (LRB): TOTAL KNEE ARTHROPLASTY (Left) Patient reports pain as mild.   Patient seen in rounds with Dr. Alvan Dame. Patient is well, and has had no acute complaints or problems. No acute events overnight. Foley catheter removed. Patient is sitting up in bed having breakfast with his family this morning. He states he is ready to go home today.  We will continue therapy today.   Objective: Vital signs in last 24 hours: Temp:  [97.4 F (36.3 C)-98.7 F (37.1 C)] 98.2 F (36.8 C) (05/28 6761) Pulse Rate:  [56-71] 60 (05/28 0608) Resp:  [14-18] 18 (05/28 0608) BP: (112-166)/(68-91) 118/80 (05/28 0608) SpO2:  [96 %-100 %] 98 % (05/28 9509) Weight:  [110.2 kg] 110.2 kg (05/27 0746)  Intake/Output from previous day:  Intake/Output Summary (Last 24 hours) at 09/30/2019 0744 Last data filed at 09/30/2019 0600 Gross per 24 hour  Intake 3301.23 ml  Output 3000 ml  Net 301.23 ml     Intake/Output this shift: No intake/output data recorded.  Labs: No results for input(s): HGB in the last 72 hours. No results for input(s): WBC, RBC, HCT, PLT in the last 72 hours. No results for input(s): NA, K, CL, CO2, BUN, CREATININE, GLUCOSE, CALCIUM in the last 72 hours. No results for input(s): LABPT, INR in the last 72 hours.  Exam: General - Patient is Alert and Oriented Extremity - Neurologically intact Sensation intact distally Intact pulses distally Dorsiflexion/Plantar flexion intact Dressing - dressing C/D/I Motor Function - intact, moving foot and toes well on exam.   Past Medical History:  Diagnosis Date  . Arthritis   . Ascending aorta dilatation (HCC)    4.1 cm noted on CT1/15/21  . CKD (chronic kidney disease) stage 2, GFR 60-89 ml/min    see [place], Stage 1  . colon ca dx'd 04/2016   colon  . Hernia, abdominal   . Hyperlipidemia   . Hypertension   . LBBB (left bundle branch block) 08/18/2019  . Neuropathy   . Pneumonia 05/19/2018    . Seasonal allergies   . 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    Assessment/Plan: 1 Day Post-Op Procedure(s) (LRB): TOTAL KNEE ARTHROPLASTY (Left) Principal Problem:   S/P left TKA Active Problems:   Status post total left knee replacement  Estimated body mass index is 29.58 kg/m as calculated from the following:   Height as of this encounter: 6\' 4"  (1.93 m).   Weight as of this encounter: 110.2 kg. Advance diet Up with therapy D/C IV fluids   Patient's anticipated LOS is less than 2 midnights, meeting these requirements: - Younger than 36 - Lives within 1 hour of care - Has a competent adult at home to recover with post-op recover - NO history of  - Chronic pain requiring opiods  - Diabetes  - Coronary Artery Disease  - Heart failure  - Heart attack  - Stroke  - DVT/VTE  - Cardiac arrhythmia  - Respiratory Failure/COPD  - Renal failure  - Anemia  - Advanced Liver disease  DVT Prophylaxis - Aspirin Weight bearing as tolerated.  Plan is to go Home after hospital stay. Plan for discharge today following 1 session of PT as long as he continues to meet his goals. Scheduled for OPPT. Follow up in the office in 2 weeks.   Griffith Citron, PA-C Orthopedic Surgery [phone] 09/30/2019, 7:44 AM

## 2019-09-30 NOTE — Progress Notes (Signed)
   09/30/19 1234  PT Visit Information  Last PT Received On 09/30/19   Exercise focused session. Reviewed TKA HEP and encouraged pt to perform 2x/day until OPPT on Tuesday. Reviewed importance of no pillow under knee and LLE positioning(avoiding excessive ER at hip and knee flexion) as well as rationale for this. Pt and wife verbalize understanding.   Assistance Needed +1  History of Present Illness Patient is 74 y.o. male s/p Lt TKA on 09/29/19 with PMH significant for HTN, HLD, CKD, OA, neuropathy.  Subjective Data  Patient Stated Goal get back to independence  Precautions  Precautions Fall  Restrictions  Weight Bearing Restrictions No  LLE Weight Bearing WBAT  Pain Assessment  Pain Assessment 0-10  Pain Score 2  Pain Location Lt knee  Pain Descriptors / Indicators Discomfort  Pain Intervention(s) Limited activity within patient's tolerance;Monitored during session;Repositioned  Cognition  Arousal/Alertness Awake/alert  Behavior During Therapy WFL for tasks assessed/performed  Overall Cognitive Status Within Functional Limits for tasks assessed  Total Joint Exercises  Ankle Circles/Pumps AROM;Both;10 reps  Quad Sets AROM;Both;10 reps  PT - End of Session  Equipment Utilized During Treatment Gait belt  Activity Tolerance Patient tolerated treatment well  Patient left in chair;with call bell/phone within reach;with chair alarm set;with family/visitor present  Nurse Communication Mobility status   PT - Assessment/Plan  PT Plan Current plan remains appropriate  PT Visit Diagnosis Muscle weakness (generalized) (M62.81);Difficulty in walking, not elsewhere classified (R26.2)  PT Frequency (ACUTE ONLY) 7X/week  Follow Up Recommendations Follow surgeon's recommendation for DC plan and follow-up therapies;Outpatient PT  PT equipment Rolling walker with 5" wheels  AM-PAC PT "6 Clicks" Mobility Outcome Measure (Version 2)  Help needed turning from your back to your side while in a flat  bed without using bedrails? 4  Help needed moving from lying on your back to sitting on the side of a flat bed without using bedrails? 3  Help needed moving to and from a bed to a chair (including a wheelchair)? 3  Help needed standing up from a chair using your arms (e.g., wheelchair or bedside chair)? 3  Help needed to walk in hospital room? 3  Help needed climbing 3-5 steps with a railing?  3  6 Click Score 19  Consider Recommendation of Discharge To: [facility] with [facility]  PT Goal Progression  Progress towards PT goals Progressing toward goals  Acute Rehab PT Goals  PT Goal Formulation With patient  Time For Goal Achievement 10/06/19  PT Time Calculation  PT Start Time (ACUTE ONLY) 1215  PT Stop Time (ACUTE ONLY) 1227  PT Time Calculation (min) (ACUTE ONLY) 12 min  PT General Charges  $$ ACUTE PT VISIT 1 Visit  PT Treatments  $Therapeutic Exercise 8-22 mins

## 2019-10-04 DIAGNOSIS — M25562 Pain in left knee: Secondary | ICD-10-CM | POA: Diagnosis not present

## 2019-10-05 NOTE — Discharge Summary (Signed)
Physician Discharge Summary   Patient ID: Louis [name] MRN: [id number] DOB/AGE: 73-Jul-1947 73 y.o.  Admit date: 09/29/2019 Discharge date: 09/30/2019  Primary Diagnosis: Left knee osteoarthritis.   Admission Diagnoses:  Past Medical History:  Diagnosis Date  . Arthritis   . Ascending aorta dilatation (HCC)    4.1 cm noted on CT1/15/21  . CKD (chronic kidney disease) stage 2, GFR 60-89 ml/min    see [place], Stage 1  . colon ca dx'd 04/2016   colon  . Hernia, abdominal   . Hyperlipidemia   . Hypertension   . LBBB (left bundle branch block) 08/18/2019  . Neuropathy   . Pneumonia 05/19/2018  . Seasonal allergies   . 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   Discharge Diagnoses:   Principal Problem:   S/P left TKA Active Problems:   Status post total left knee replacement  Estimated body mass index is 29.58 kg/m as calculated from the following:   Height as of this encounter: 6\' 4"  (1.93 m).   Weight as of this encounter: 110.2 kg.  Procedure:  Procedure(s) (LRB): TOTAL KNEE ARTHROPLASTY (Left)   Consults: None  HPI:  SAFAL HALDERMAN is a 74 y.o. male patient of   mine.  The patient had been seen, evaluated, and treated for months conservatively in the   office with medication, activity modification, and injections.  The patient had   radiographic changes of bone-on-bone arthritis with endplate sclerosis and osteophytes noted.  Based on the radiographic changes and failed conservative measures, the patient   decided to proceed with definitive treatment, total knee replacement.  Risks of infection, DVT, component failure, need for revision surgery, neurovascular injury were reviewed in the office setting.  The postop course was reviewed stressing the efforts to maximize post-operative satisfaction and function.  Consent was obtained for benefit of pain   relief.       Laboratory Data: Hospital Outpatient Visit on 09/26/2019  Component Date Value Ref Range  Status  . SARS Coronavirus 2 09/26/2019 NEGATIVE  NEGATIVE Final   Comment: (NOTE) SARS-CoV-2 target nucleic acids are NOT DETECTED. The SARS-CoV-2 RNA is generally detectable in upper and lower respiratory specimens during the acute phase of infection. Negative results do not preclude SARS-CoV-2 infection, do not rule out co-infections with other pathogens, and should not be used as the sole basis for treatment or other patient management decisions. Negative results must be combined with clinical observations, patient history, and epidemiological information. The expected result is Negative. Fact Sheet for Patients: SugarRoll.be Fact Sheet for Healthcare Providers: https://www.woods-mathews.com/ This test is not yet approved or cleared by the [place] FDA and  has been authorized for detection and/or diagnosis of SARS-CoV-2 by FDA under an Emergency Use Authorization (EUA). This EUA will remain  in effect (meaning this test can be used) for the duration of the COVID-19 declaration under Section 56                          4(b)(1) of the Act, 21 U.S.C. section 360bbb-3(b)(1), unless the authorization is terminated or revoked sooner. Performed at [facility], [place] [address] [place], [place] [address]   Hospital Outpatient Visit on 09/20/2019  Component Date Value Ref Range Status  . MRSA, PCR 09/20/2019 NEGATIVE  NEGATIVE Final  . Staphylococcus aureus 09/20/2019 NEGATIVE  NEGATIVE Final   Comment: (NOTE) The Xpert SA Assay (FDA approved for NASAL specimens in patients 22 years of  age and older), is one component of a comprehensive surveillance program. It is not intended to diagnose infection nor to guide or monitor treatment. Performed at [facility], [place] [address], [place], [place] [address]   . ABO/RH(D) 09/20/2019 A POS   Final  . Antibody Screen 09/20/2019 NEG   Final  . Sample Expiration  09/20/2019 10/02/2019,2359   Final  . Extend sample reason 09/20/2019    Final                   Value:NO TRANSFUSIONS OR PREGNANCY IN THE PAST 3 MONTHS Performed at [place] [address] [place], [place] [address]   . Sodium 09/20/2019 140  135 - 145 mmol/L Final  . Potassium 09/20/2019 4.4  3.5 - 5.1 mmol/L Final  . Chloride 09/20/2019 110  98 - 111 mmol/L Final  . CO2 09/20/2019 21* 22 - 32 mmol/L Final  . Glucose, Bld 09/20/2019 101* 70 - 99 mg/dL Final   Glucose reference range applies only to samples taken after fasting for at least 8 hours.  . BUN 09/20/2019 37* 8 - 23 mg/dL Final  . Creatinine, Ser 09/20/2019 2.56* 0.61 - 1.24 mg/dL Final  . Calcium 09/20/2019 8.9  8.9 - 10.3 mg/dL Final  . GFR calc non Af Amer 09/20/2019 24* >60 mL/min Final  . GFR calc Af Amer 09/20/2019 28* >60 mL/min Final  . Anion gap 09/20/2019 9  5 - 15 Final   Performed at [facility], [place] [address] [place], [place] [address]  . WBC 09/20/2019 5.1  4.0 - 10.5 K/uL Final  . RBC 09/20/2019 4.47  4.22 - 5.81 MIL/uL Final  . Hemoglobin 09/20/2019 13.5  13.0 - 17.0 g/dL Final  . HCT 09/20/2019 41.7  39.0 - 52.0 % Final  . MCV 09/20/2019 93.3  80.0 - 100.0 fL Final  . MCH 09/20/2019 30.2  26.0 - 34.0 pg Final  . MCHC 09/20/2019 32.4  30.0 - 36.0 g/dL Final  . RDW 09/20/2019 13.9  11.5 - 15.5 % Final  . Platelets 09/20/2019 164  150 - 400 K/uL Final  . nRBC 09/20/2019 0.0  0.0 - 0.2 % Final   Performed at [facility], [place] [address] [place], [place] [address]  . ABO/RH(D) 09/20/2019    Final                   Value:A POS Performed at [facility], [place] [address] [place], [place] [address]   Office Visit on 08/18/2019  Component Date Value Ref Range Status  . Rest HR 08/29/2019 59  bpm Final  . Rest BP 08/29/2019 148/84  mmHg Final  . Peak HR 08/29/2019 76  bpm Final  . Peak BP 08/29/2019 143/62  mmHg Final  . SSS  08/29/2019 8   Final  . SRS 08/29/2019 2   Final  . SDS 08/29/2019 6   Final  . TID 08/29/2019 1.07   Final  . LV sys vol 08/29/2019 71  mL Final  . LV dias vol 08/29/2019 151  62 - 150 mL Final     X-Rays:No results found.  EKG: Orders placed or performed in visit on 08/18/19  . EKG 12-Lead     Hospital Course: SAHAS SLUKA is a 74 y.o. who was admitted to [facility]. They were brought to the operating room on 09/29/2019 and underwent Procedure(s): TOTAL KNEE ARTHROPLASTY.  Patient tolerated the  procedure well and was later transferred to the recovery room and then to the orthopaedic floor for postoperative care. They were given PO and IV analgesics for pain control following their surgery. They were given 24 hours of postoperative antibiotics of  Anti-infectives (From admission, onward)   Start     Dose/Rate Route Frequency Ordered Stop   09/29/19 1615  ceFAZolin (ANCEF) IVPB 2g/100 mL premix     2 g 200 mL/hr over 30 Minutes Intravenous Every 6 hours 09/29/19 1612 09/29/19 2207   09/29/19 0800  ceFAZolin (ANCEF) IVPB 2g/100 mL premix     2 g 200 mL/hr over 30 Minutes Intravenous On call to O.R. 09/29/19 0745 09/29/19 1025     and started on DVT prophylaxis in the form of Aspirin.   PT and OT were ordered for total joint protocol. Discharge planning consulted to help with postop disposition and equipment needs.  Patient had a good night on the evening of surgery. They started to get up OOB with therapy on POD #0. Pt was seen during rounds and was ready to go home pending progress with therapy. He worked with therapy on POD #1 and was meeting his goals. Pt was discharged to home later that day in stable condition.  Diet: Regular diet Activity: WBAT Follow-up: in 2 weeks Disposition: Home Discharged Condition: good   Discharge Instructions    Call MD / Call 911   Complete by: As directed    If you experience chest pain or shortness of breath, CALL 911 and be  transported to the hospital emergency room.  If you develope a fever above 101 F, pus (white drainage) or increased drainage or redness at the wound, or calf pain, call your surgeon's office.   Change dressing   Complete by: As directed    Maintain surgical dressing until follow up in the clinic. If the edges start to pull up, may reinforce with tape. If the dressing is no longer working, may remove and cover with gauze and tape, but must keep the area dry and clean.  Call with any questions or concerns.   Constipation Prevention   Complete by: As directed    Drink plenty of fluids.  Prune juice may be helpful.  You may use a stool softener, such as Colace (over the counter) 100 mg twice a day.  Use MiraLax (over the counter) for constipation as needed.   Diet - low sodium heart healthy   Complete by: As directed    Discharge instructions   Complete by: As directed    Maintain surgical dressing until follow up in the clinic. If the edges start to pull up, may reinforce with tape. If the dressing is no longer working, may remove and cover with gauze and tape, but must keep the area dry and clean.  Follow up in 2 weeks at [facility]. Call with any questions or concerns.   Increase activity slowly as tolerated   Complete by: As directed    Weight bearing as tolerated with assist device (walker, cane, etc) as directed, use it as long as suggested by your surgeon or therapist, typically at least 4-6 weeks.   TED hose   Complete by: As directed    Use stockings (TED hose) for 2 weeks on both leg(s).  You may remove them at night for sleeping.     Allergies as of 09/30/2019   No Known Allergies     Medication List    STOP taking these medications  Tylenol 8 Hour Arthritis Pain 650 MG CR tablet Generic drug: acetaminophen     TAKE these medications   amLODipine 10 MG tablet Commonly known as: NORVASC Take 10 mg by mouth daily.   aspirin 81 MG chewable tablet Commonly known as: Aspirin  Childrens Chew 1 tablet (81 mg total) by mouth 2 (two) times daily. Take for 4 weeks, then resume regular dose.   Claritin 10 MG tablet Generic drug: loratadine Take 10 mg by mouth daily as needed for allergies.   CORICIDIN HBP PO Take 1 tablet by mouth daily as needed (allergies).   docusate sodium 100 MG capsule Commonly known as: Colace Take 1 capsule (100 mg total) by mouth 2 (two) times daily.   ferrous sulfate 325 (65 FE) MG tablet Commonly known as: FerrouSul Take 1 tablet (325 mg total) by mouth 3 (three) times daily with meals for 14 days.   Fish Oil 1200 MG Caps Take 3,600 mg by mouth daily.   HYDROcodone-acetaminophen 7.5-325 MG tablet Commonly known as: Norco Take 1-2 tablets by mouth every 4 (four) hours as needed for moderate pain.   labetalol 200 MG tablet Commonly known as: NORMODYNE Take 200 mg by mouth 3 (three) times daily.   lisinopril 5 MG tablet Commonly known as: ZESTRIL Take 5 mg by mouth daily.   methocarbamol 500 MG tablet Commonly known as: Robaxin Take 1 tablet (500 mg total) by mouth every 6 (six) hours as needed for muscle spasms.   polyethylene glycol 17 g packet Commonly known as: MIRALAX / GLYCOLAX Take 17 g by mouth 2 (two) times daily.            Discharge Care Instructions  (From admission, onward)         Start     Ordered   09/30/19 0000  Change dressing    Comments: Maintain surgical dressing until follow up in the clinic. If the edges start to pull up, may reinforce with tape. If the dressing is no longer working, may remove and cover with gauze and tape, but must keep the area dry and clean.  Call with any questions or concerns.   09/30/19 0747         Follow-up Information    [facility] Go on 10/04/2019.   Why: You are scheduled for a physical therapy appointment on 10-04-19 at 9:30 am.  Contact information: [employer] [place] [address] [phone]        Danae Orleans,  PA-C. Go on 10/13/2019.   Specialty: Orthopedic Surgery Why: You are scheduled for a post-operative appointment on 10-13-19 at 3:00 pm.  Contact information: [address] [place] [address] [phone]           Signed: Griffith Citron, PA-C Orthopedic Surgery 10/05/2019, 8:05 AM

## 2019-10-06 DIAGNOSIS — M25562 Pain in left knee: Secondary | ICD-10-CM | POA: Diagnosis not present

## 2019-10-10 ENCOUNTER — Other Ambulatory Visit: Payer: Self-pay

## 2019-10-10 ENCOUNTER — Encounter: Payer: Self-pay | Admitting: Physical Therapy

## 2019-10-10 ENCOUNTER — Ambulatory Visit: Payer: Medicare Other | Attending: Orthopedic Surgery | Admitting: Physical Therapy

## 2019-10-10 DIAGNOSIS — M25562 Pain in left knee: Secondary | ICD-10-CM | POA: Diagnosis not present

## 2019-10-10 DIAGNOSIS — G8929 Other chronic pain: Secondary | ICD-10-CM | POA: Diagnosis not present

## 2019-10-10 DIAGNOSIS — R609 Edema, unspecified: Secondary | ICD-10-CM | POA: Diagnosis not present

## 2019-10-10 DIAGNOSIS — M25561 Pain in right knee: Secondary | ICD-10-CM | POA: Insufficient documentation

## 2019-10-10 DIAGNOSIS — M6281 Muscle weakness (generalized): Secondary | ICD-10-CM | POA: Insufficient documentation

## 2019-10-10 NOTE — Patient Instructions (Signed)
Access Code: [email or web address] URL: [email or web address]: 06/07/2021Prepared by: Venetia Night BeuhringExercises  Supine Heel Slide - 2 x daily - 7 x weekly - 3 sets - 10 reps  Supine Knee Flexion Self Mobilization - 2 x daily - 7 x weekly - 2 sets - 10 reps - 10 hold  Supine Knee Extension Stretch on Towel Roll - 2 x daily - 7 x weekly - 3 sets - 10 reps - 2-3 min hold  Seated Long Arc Quad - 1 x daily - 7 x weekly - 10 reps - 3 sets

## 2019-10-10 NOTE — Therapy (Signed)
[facility] [address] [place], [place], [address] Phone: [phone]   Fax:  [phone]  Physical Therapy Evaluation  Patient Details  Name: Louis [name] MRN: [id number] Date of Birth: [birth year] Referring Provider (PT): Paralee Cancel, MD   Encounter Date: 10/10/2019  PT End of Session - 10/10/19 1249    Visit Number  1    Date for PT Re-Evaluation  12/05/19    Authorization Type  Medicare    Progress Note Due on Visit  10    PT Start Time  1110   Pt late   PT Stop Time  1155   10 min vaso end of session   PT Time Calculation (min)  45 min    Activity Tolerance  Patient tolerated treatment well    Behavior During Therapy  [facility] for tasks assessed/performed       Past Medical History:  Diagnosis Date  . Arthritis   . Ascending aorta dilatation (HCC)    4.1 cm noted on CT1/15/21  . CKD (chronic kidney disease) stage 2, GFR 60-89 ml/min    see [place], Stage 1  . colon ca dx'd 04/2016   colon  . Hernia, abdominal   . Hyperlipidemia   . Hypertension   . LBBB (left bundle branch block) 08/18/2019  . Neuropathy   . Pneumonia 05/19/2018  . Seasonal allergies   . 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    Past Surgical History:  Procedure Laterality Date  . BIOPSY  04/10/2016   Procedure: BIOPSY Mesentary;  Surgeon: Judeth Horn, MD;  Location: [place];  Service: General;;  . COLOSTOMY    . COLOSTOMY REVERSAL N/A 01/12/2017   Procedure: COLOSTOMY REVERSAL;  Surgeon: Judeth Horn, MD;  Location: [place];  Service: General;  Laterality: N/A;  . FLEXIBLE SIGMOIDOSCOPY N/A 04/09/2016   Procedure: Beryle Quant;  Surgeon: Ladene Artist, MD;  Location: [facility] ENDOSCOPY;  Service: Endoscopy;  Laterality: N/A;  . HERNIA REPAIR     in childhood  . INGUINAL HERNIA REPAIR Right 03/29/2018   Procedure: OPEN REPAIR OF RIGHT INGUINAL HERNIA WITH MESH;  Surgeon: Judeth Horn, MD;  Location: [place];  Service:  General;  Laterality: Right;  . INSERTION OF MESH N/A 03/29/2018   Procedure: INSERTION OF MESH;  Surgeon: Judeth Horn, MD;  Location: [place];  Service: General;  Laterality: N/A;  . IR GENERIC HISTORICAL  05/14/2016   IR US GUIDE VASC ACCESS RIGHT 05/14/2016 Corrie Mckusick, DO [facility]  . IR GENERIC HISTORICAL  05/14/2016   IR FLUORO GUIDE PORT INSERTION RIGHT 05/14/2016 Corrie Mckusick, DO [facility]  . IR REMOVAL TUN ACCESS W/ PORT W/O FL MOD SED  11/11/2016  . PARTIAL COLECTOMY N/A 04/10/2016   Procedure: SIGMOID  COLECTOMY WITH COLOSTOMY;  Surgeon: Judeth Horn, MD;  Location: [place];  Service: General;  Laterality: N/A;  . TONSILLECTOMY    . TOTAL KNEE ARTHROPLASTY Left 09/29/2019   Procedure: TOTAL KNEE ARTHROPLASTY;  Surgeon: Paralee Cancel, MD;  [facility];  Service: Orthopedics;  Laterality: Left;  70 mins    There were no vitals filed for this visit.   Subjective Assessment - 10/10/19 1110    Subjective  Pt is s/p Lt TKR on 09/29/19.  Pain is 1/10, no need for any more meds as of last week.  Did 2 PT visits with Dr. Aurea Graff office.    Limitations  Walking;Standing;Sitting;House hold activities    How long can you  sit comfortably?  1+ hours    How long can you stand comfortably?  5 min    How long can you walk comfortably?  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  Yes    Pain Score  1     Pain Location  Knee    Pain Orientation  Left    Pain Descriptors / Indicators  Sore    Pain Type  Surgical pain    Pain Onset  1 to 4 weeks ago    Pain Frequency  Intermittent    Aggravating Factors   standing and walking > 5', exercises hurt    Pain Relieving Factors  ice 4-5 times/day, elevation    Effect of Pain on Daily Activities  not working for now, walking, standing activites         [facility] PT Assessment - 10/10/19 0001      Assessment   Medical Diagnosis  Z96.652 (ICD-10-CM) - Presence of left artificial knee  joint    Referring Provider (PT)  Paralee Cancel, MD    Onset Date/Surgical Date  09/29/19    Hand Dominance  Left    Next MD Visit  10/12/19    Prior Therapy  yes, 2 visits with Dr. Aurea Graff office      Precautions   Precautions  None      Restrictions   Weight Bearing Restrictions  No      Balance Screen   Has the patient fallen in the past 6 months  No      [place] residence    Living Arrangements  Spouse/significant other    Type of [place] to enter    Entrance Stairs-Number of Steps  6    Entrance Stairs-Rails  Can reach both;Left;Right    Home Layout  Two level;Able to live on main level with bedroom/bathroom    [place] - 2 wheels;[place] - single point    Additional Comments  using walker for household and community mobility      Prior Function   Level of Independence  Independent with household mobility with device;Independent with community mobility with device    Vocation  Full time employment    Vocation Requirements  currently not working, Press photographer, hopes to go back next week    Leisure  drink wine at [employer]   Overall Cognitive Status  Within Functional Limits for tasks assessed      Observation/Other Assessments   Observations  Lt anterior knee dressing dry and intact, mild warmth of Lt knee, bruising along medial knee and calf present, mild pitting edema along tibia    Focus on Therapeutic Outcomes (FOTO)   78%, goal 51%      Observation/Other Assessments-Edema    Edema  Circumferential      Circumferential Edema   Circumferential - Right  43    Circumferential - Left   47      Functional Tests   Functional tests  Sit to Stand      Sit to Stand   Comments  uses bil UEs      Posture/Postural Control   Posture/Postural Control  Postural limitations    Postural Limitations  Rounded Shoulders;Forward head;Flexed trunk      ROM / Strength   AROM / PROM / Strength  AROM;Strength;PROM      AROM   AROM Assessment Site  Knee    Right/Left Knee  Right;Left    Right Knee Extension  -15    Right Knee Flexion  125    Left Knee Extension  0    Left Knee Flexion  88      PROM   PROM Assessment Site  Hip    Right/Left Hip  Left    Left Hip Flexion  95    Left Hip External Rotation   30    Left Hip Internal Rotation   0      Strength   Overall Strength Comments  Lt knee 4-/5, Lt hip 4/5, Rt LE 5/5      Flexibility   Soft Tissue Assessment /Muscle Length  yes    Hamstrings  limited bil    Piriformis  limited Lt      Palpation   Palpation comment  Lt knee non-tender      Ambulation/Gait   Assistive device  Rolling walker    Gait Pattern  Step-through pattern;Decreased step length - right;Decreased step length - left;Decreased stride length;Decreased hip/knee flexion - left    Stairs  Yes    Stairs Assistance  6: Modified independent (Device/Increase time)    Stair Management Technique  Two rails;Step to pattern   step-to pattern up/down   Number of Stairs  4    Height of Stairs  6                  Objective measurements completed on examination: See above findings.      [facility] Adult PT Treatment/Exercise - 10/10/19 0001      Exercises   Exercises  Knee/Hip      Knee/Hip Exercises: Aerobic   Recumbent Bike  rock and roll x 3'      Modalities   Modalities  Vasopneumatic      Vasopneumatic   Number Minutes Vasopneumatic   10 minutes    Vasopnuematic Location   Knee   Lt   Vasopneumatic Pressure  Low    Vasopneumatic Temperature   three snowflakes             PT Education - 10/10/19 1147    Education Details  Access Code: [email or web address]    Person(s) Educated  Patient    Methods  Explanation;Demonstration;Verbal cues;Handout    Comprehension  Verbalized understanding;Returned demonstration       PT Short Term Goals - 10/10/19 1257      PT SHORT TERM GOAL #1   Title  ind with initial HEP    Time  3    Period   Weeks    Status  New    Target Date  10/31/19      PT SHORT TERM GOAL #2   Title  Pt will be able to tolerate at least 10 min of standing/walking for household activity.    Baseline  5'    Time  4    Period  Weeks    Status  New    Target Date  11/07/19      PT SHORT TERM GOAL #3   Title  Lt knee ROM at least -10 ext and 95 flexion    Time  4    Period  Weeks    Status  New    Target Date  11/07/19      PT SHORT TERM GOAL #4   Title  Pt will transition to a [facility]  for household and short community outings.    Time  4    Period  Weeks    Status  New    Target Date  11/07/19        PT Long Term Goals - 10/10/19 1259      PT LONG TERM GOAL #1   Title  Pt will achieve Lt LE strength of at least 4+/5 for improved transfers, gait endurance and stairs.    Time  8    Period  Weeks    Status  New    Target Date  12/05/19      PT LONG TERM GOAL #2   Title  Improved Lt knee A/ROM to at least -5 ext and 115 flexion to normalize gait and functional squatting range.    Baseline  -15 to 88    Time  8    Period  Weeks    Status  New    Target Date  12/05/19      PT LONG TERM GOAL #3   Title  able to demo stairs with alternating pattern with single rail    Time  8    Period  Weeks    Status  New    Target Date  12/05/19      PT LONG TERM GOAL #4   Title  Pt will be able to tolerate standing household and community activities x at least 30 min with pain not to exceed 3/10 in Lt knee.    Time  8    Period  Weeks    Status  New    Target Date  12/05/19      PT LONG TERM GOAL #5   Title  reduce FOTO to </= 51% limitation for improved function    Baseline  78%    Time  8    Period  Weeks    Status  New    Target Date  12/05/19             Plan - 10/10/19 1250    Clinical Impression Statement  Pt is s/p Lt TKR on 09/29/19 with Dr. Alvan Dame.  He had 2 sessions of PT before transferring to this location.  Pain on arrival for appointment was low.  Pain is worse with  standing/walking > 5'.  He is ambulating with 2-wheel walker for household mobility and short community outings for doctor appointments.  Rt knee ROM is 0-125 and Lt knee A/ROM is -15 ext and 88 flexion, with pain at end ranges.  Lt knee strength is 4-/5, Lt hip is 4/5.  Rt LE strength is 5/5 throughout.  Circumferential edema difference Lt and Rt is 4cm.  Pt demo'd sit to stand from elevated chair with bil UE use and stair pattern of step-to for ascending and descending stairs using bil rails.  PT started him on rock-and-roll on recumbent bike and did vasopneumatic device end of sesion.  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  He will benefit from skilled PT to address deficits and improve function following surgery.    Personal Factors and Comorbidities  Time since onset of injury/illness/exacerbation    Examination-Activity Limitations  Locomotion Level;Transfers;Squat;Stairs;Sit;Stand    Examination-Participation Restrictions  Community Activity;Driving;Yard Work;Shop    Stability/Clinical Decision Making  Stable/Uncomplicated    Clinical Decision Making  Low    Rehab Potential  Good    PT Frequency  2x / week  PT Duration  8 weeks    PT Treatment/Interventions  ADLs/Self Care Home Management;Cryotherapy;Electrical Stimulation;Moist Heat;Neuromuscular re-education;Therapeutic exercise;Functional mobility training;Stair training;Gait training;Therapeutic activities;Patient/family education;Manual techniques;Dry needling;Passive range of motion;Scar mobilization;Taping;Joint Manipulations    PT Next Visit Plan  NuStep or rock and roll on bike, Lt knee ROM, try step ups on low riser, progress Lt LE strength on Medbridge as tol    PT Home Exercise Plan  Access Code: [email or web address]    Consulted and Agree with Plan of Care  Patient       Patient will benefit from skilled therapeutic intervention in order to improve the following deficits and impairments:   Abnormal gait, Decreased range of motion, Difficulty walking, Decreased activity tolerance, Pain, Hypomobility, Impaired flexibility, Decreased strength, Postural dysfunction, Increased edema  Visit Diagnosis: Chronic pain of left knee - Plan: PT plan of care cert/re-cert  Muscle weakness (generalized) - Plan: PT plan of care cert/re-cert     Problem List Patient Active Problem List   Diagnosis Date Noted  . S/P left TKA 09/29/2019  . Status post total left knee replacement 09/29/2019  . Preoperative cardiovascular examination 08/18/2019  . S/P right inguinal hernia repair 03/29/2018  . New onset left bundle branch block (LBBB) 01/12/2018  . Atherosclerotic vascular disease 01/12/2018  . 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 Port catheter in place 07/02/2016  . 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 Elevated fasting glucose 04/10/2016  . Right inguinal hernia 04/10/2016  . Adenocarcinoma of colon ([place]) 04/10/2016  . Enterocolitis   . Essential hypertension 04/04/2016  . Acute kidney injury superimposed on chronic kidney disease Stage 2 ([place]) 04/04/2016  . Ileus ([place]) 04/02/2016    Suzy Kugel, PT 10/10/19 1:06 PM   [facility] [address], [place], [place], [address] Phone: [phone]   Fax:  [phone]  Name: Louis [name] MRN: [id number] Date of Birth: 03-Mar-1946

## 2019-10-14 ENCOUNTER — Encounter: Payer: Self-pay | Admitting: Physical Therapy

## 2019-10-14 ENCOUNTER — Ambulatory Visit: Payer: Medicare Other | Admitting: Physical Therapy

## 2019-10-14 ENCOUNTER — Other Ambulatory Visit: Payer: Self-pay

## 2019-10-14 DIAGNOSIS — M25562 Pain in left knee: Secondary | ICD-10-CM | POA: Diagnosis not present

## 2019-10-14 DIAGNOSIS — M6281 Muscle weakness (generalized): Secondary | ICD-10-CM | POA: Diagnosis not present

## 2019-10-14 DIAGNOSIS — G8929 Other chronic pain: Secondary | ICD-10-CM | POA: Diagnosis not present

## 2019-10-14 DIAGNOSIS — R609 Edema, unspecified: Secondary | ICD-10-CM | POA: Diagnosis not present

## 2019-10-14 DIAGNOSIS — M25561 Pain in right knee: Secondary | ICD-10-CM

## 2019-10-14 NOTE — Therapy (Signed)
[facility] [address], [place], [place], [address] Phone: [phone]   Fax:  [phone]  Physical Therapy Treatment  Patient Details  Name: Louis [name] MRN: [id number] Date of Birth: 17-Dec-1945 Referring Provider (PT): Paralee Cancel, MD   Encounter Date: 10/14/2019   PT End of Session - 10/14/19 1022    Visit Number 2    Date for PT Re-Evaluation 12/05/19    Authorization Type Medicare    PT Start Time 1013    PT Stop Time 1100    PT Time Calculation (min) 47 min    Activity Tolerance Patient tolerated treatment well    Behavior During Therapy [facility] for tasks assessed/performed           Past Medical History:  Diagnosis Date  . Arthritis   . Ascending aorta dilatation (HCC)    4.1 cm noted on CT1/15/21  . CKD (chronic kidney disease) stage 2, GFR 60-89 ml/min    see [place], Stage 1  . colon ca dx'd 04/2016   colon  . Hernia, abdominal   . Hyperlipidemia   . Hypertension   . LBBB (left bundle branch block) 08/18/2019  . Neuropathy   . Pneumonia 05/19/2018  . Seasonal allergies   . Thrombocytopenia ([place])    during chemo.    Past Surgical History:  Procedure Laterality Date  . BIOPSY  04/10/2016   Procedure: BIOPSY Mesentary;  Surgeon: Judeth Horn, MD;  Location: [place];  Service: General;;  . COLOSTOMY    . COLOSTOMY REVERSAL N/A 01/12/2017   Procedure: COLOSTOMY REVERSAL;  Surgeon: Judeth Horn, MD;  Location: [place];  Service: General;  Laterality: N/A;  . FLEXIBLE SIGMOIDOSCOPY N/A 04/09/2016   Procedure: Beryle Quant;  Surgeon: Ladene Artist, MD;  Location: [facility] ENDOSCOPY;  Service: Endoscopy;  Laterality: N/A;  . HERNIA REPAIR     in childhood  . INGUINAL HERNIA REPAIR Right 03/29/2018   Procedure: OPEN REPAIR OF RIGHT INGUINAL HERNIA WITH MESH;  Surgeon: Judeth Horn, MD;  Location: [place];  Service: General;  Laterality: Right;  . INSERTION OF MESH N/A 03/29/2018    Procedure: INSERTION OF MESH;  Surgeon: Judeth Horn, MD;  Location: [place];  Service: General;  Laterality: N/A;  . IR GENERIC HISTORICAL  05/14/2016   IR US GUIDE VASC ACCESS RIGHT 05/14/2016 Corrie Mckusick, DO [facility]  . IR GENERIC HISTORICAL  05/14/2016   IR FLUORO GUIDE PORT INSERTION RIGHT 05/14/2016 Corrie Mckusick, DO [facility]  . IR REMOVAL TUN ACCESS W/ PORT W/O FL MOD SED  11/11/2016  . PARTIAL COLECTOMY N/A 04/10/2016   Procedure: SIGMOID  COLECTOMY WITH COLOSTOMY;  Surgeon: Judeth Horn, MD;  Location: [place];  Service: General;  Laterality: N/A;  . TONSILLECTOMY    . TOTAL KNEE ARTHROPLASTY Left 09/29/2019   Procedure: TOTAL KNEE ARTHROPLASTY;  Surgeon: Paralee Cancel, MD;  [facility];  Service: Orthopedics;  Laterality: Left;  70 mins    There were no vitals filed for this visit.   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 Yes    Pain Score 1     Pain Location Knee    Pain Orientation Left    Pain Descriptors / Indicators Sore    Pain Type Surgical pain    Pain Onset 1 to 4  weeks ago    Pain Frequency Intermittent    Multiple Pain Sites No                             [facility] Adult PT Treatment/Exercise - 10/14/19 0001      Ambulation/Gait   Assistive device Straight cane    Gait Pattern Step-through pattern    Gait Comments educated on using cane and cues to keep cane with affected side      Knee/Hip Exercises: Aerobic   Recumbent Bike rock and roll x 7'   PT present to encourange maximum ROM     Knee/Hip Exercises: Supine   Short Arc Quad Sets Strengthening;Left;10 reps   5 sec hold   Heel Slides AAROM;Left;1 set;5 reps   10 sec hold   Straight Leg Raises Strengthening;Left;10 reps      Vasopneumatic   Number Minutes Vasopneumatic  15 minutes    Vasopnuematic Location  Knee    Vasopneumatic Pressure Medium     Vasopneumatic Temperature  three snowflakes   needed it turned down halfway through                   PT Short Term Goals - 10/10/19 1257      PT SHORT TERM GOAL #1   Title ind with initial HEP    Time 3    Period Weeks    Status New    Target Date 10/31/19      PT SHORT TERM GOAL #2   Title Pt will be able to tolerate at least 10 min of standing/walking for household activity.    Baseline 5'    Time 4    Period Weeks    Status New    Target Date 11/07/19      PT SHORT TERM GOAL #3   Title Lt knee ROM at least -10 ext and 95 flexion    Time 4    Period Weeks    Status New    Target Date 11/07/19      PT SHORT TERM GOAL #4   Title Pt will transition to a [facility] for household and short community outings.    Time 4    Period Weeks    Status New    Target Date 11/07/19             PT Long Term Goals - 10/10/19 1259      PT LONG TERM GOAL #1   Title Pt will achieve Lt LE strength of at least 4+/5 for improved transfers, gait endurance and stairs.    Time 8    Period Weeks    Status New    Target Date 12/05/19      PT LONG TERM GOAL #2   Title Improved Lt knee A/ROM to at least -5 ext and 115 flexion to normalize gait and functional squatting range.    Baseline -15 to 88    Time 8    Period Weeks    Status New    Target Date 12/05/19      PT LONG TERM GOAL #3   Title able to demo stairs with alternating pattern with single rail    Time 8    Period Weeks    Status New    Target Date 12/05/19      PT LONG TERM GOAL #4   Title Pt will be able to tolerate standing household and community activities x  at least 30 min with pain not to exceed 3/10 in Lt knee.    Time 8    Period Weeks    Status New    Target Date 12/05/19      PT LONG TERM GOAL #5   Title reduce FOTO to </= 51% limitation for improved function    Baseline 78%    Time 8    Period Weeks    Status New    Target Date 12/05/19                 Plan - 10/14/19 1055     Clinical Impression Statement Pt did well with initial treatment today.  Pt wants to switch to [facility] and demontrates he can do this.  He did need cues to consistently use cane and Lt LE together as he tends to take several steps without cane support and then gait becomes antalgic  Pt was able to get 104 deg ROM of Lt knee flexion today.  Strength was added to HEP.  Continue with POC.    PT Treatment/Interventions ADLs/Self Care Home Management;Cryotherapy;Electrical Stimulation;Moist Heat;Neuromuscular re-education;Therapeutic exercise;Functional mobility training;Stair training;Gait training;Therapeutic activities;Patient/family education;Manual techniques;Dry needling;Passive range of motion;Scar mobilization;Taping;Joint Manipulations    PT Next Visit Plan NuStep or rock and roll on bike, Lt knee ROM, cont step ups on low riser, progress Lt LE strength on Medbridge as tol    PT Home Exercise Plan Access Code: [email or web address]    Consulted and Agree with Plan of Care Patient           Patient will benefit from skilled therapeutic intervention in order to improve the following deficits and impairments:  Abnormal gait, Decreased range of motion, Difficulty walking, Decreased activity tolerance, Pain, Hypomobility, Impaired flexibility, Decreased strength, Postural dysfunction, Increased edema  Visit Diagnosis: Chronic pain of left knee  Muscle weakness (generalized)  Chronic pain of right knee     Problem List Patient Active Problem List   Diagnosis Date Noted  . S/P left TKA 09/29/2019  . Status post total left knee replacement 09/29/2019  . Preoperative cardiovascular examination 08/18/2019  . S/P right inguinal hernia repair 03/29/2018  . New onset left bundle branch block (LBBB) 01/12/2018  . Atherosclerotic vascular disease 01/12/2018  . 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 Port catheter in place 07/02/2016  . Aorto-iliac atherosclerosis ([place]) 04/10/2016  . Elevated fasting glucose  04/10/2016  . Right inguinal hernia 04/10/2016  . Adenocarcinoma of colon ([place]) 04/10/2016  . Enterocolitis   . Essential hypertension 04/04/2016  . Acute kidney injury superimposed on chronic kidney disease Stage 2 ([place]) 04/04/2016  . Ileus ([place]) 04/02/2016    Jule Ser, PT 10/14/2019, 11:05 AM  [facility] [address], [place], [place], [address] Phone: [phone]   Fax:  [phone]  Name: Louis [name] MRN: [id number] Date of Birth: [birth year]

## 2019-10-18 ENCOUNTER — Other Ambulatory Visit: Payer: Self-pay

## 2019-10-18 ENCOUNTER — Encounter: Payer: Self-pay | Admitting: Physical Therapy

## 2019-10-18 ENCOUNTER — Ambulatory Visit: Payer: Medicare Other | Admitting: Physical Therapy

## 2019-10-18 DIAGNOSIS — R609 Edema, unspecified: Secondary | ICD-10-CM | POA: Diagnosis not present

## 2019-10-18 DIAGNOSIS — G8929 Other chronic pain: Secondary | ICD-10-CM | POA: Diagnosis not present

## 2019-10-18 DIAGNOSIS — M25562 Pain in left knee: Secondary | ICD-10-CM | POA: Diagnosis not present

## 2019-10-18 DIAGNOSIS — M25561 Pain in right knee: Secondary | ICD-10-CM | POA: Diagnosis not present

## 2019-10-18 DIAGNOSIS — M6281 Muscle weakness (generalized): Secondary | ICD-10-CM

## 2019-10-18 NOTE — Therapy (Signed)
[facility] [address], [place], [place], [address] Phone: [phone]   Fax:  [phone]  Physical Therapy Treatment  Patient Details  Name: Louis [name] MRN: [id number] Date of Birth: January 30, 1946 Referring Provider (PT): Paralee Cancel, MD   Encounter Date: 10/18/2019   PT End of Session - 10/18/19 1400    Visit Number 3    Date for PT Re-Evaluation 12/05/19    Authorization Type Medicare    Progress Note Due on Visit 10    PT Start Time 1400    PT Stop Time 1447    PT Time Calculation (min) 47 min    Activity Tolerance Patient tolerated treatment well    Behavior During Therapy [facility] for tasks assessed/performed           Past Medical History:  Diagnosis Date  . Arthritis   . Ascending aorta dilatation (HCC)    4.1 cm noted on CT1/15/21  . CKD (chronic kidney disease) stage 2, GFR 60-89 ml/min    see [place], Stage 1  . colon ca dx'd 04/2016   colon  . Hernia, abdominal   . Hyperlipidemia   . Hypertension   . LBBB (left bundle branch block) 08/18/2019  . Neuropathy   . Pneumonia 05/19/2018  . Seasonal allergies   . 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    Past Surgical History:  Procedure Laterality Date  . BIOPSY  04/10/2016   Procedure: BIOPSY Mesentary;  Surgeon: Judeth Horn, MD;  Location: [place];  Service: General;;  . COLOSTOMY    . COLOSTOMY REVERSAL N/A 01/12/2017   Procedure: COLOSTOMY REVERSAL;  Surgeon: Judeth Horn, MD;  Location: [place];  Service: General;  Laterality: N/A;  . FLEXIBLE SIGMOIDOSCOPY N/A 04/09/2016   Procedure: Beryle Quant;  Surgeon: Ladene Artist, MD;  Location[facility];  Service: Endoscopy;  Laterality: N/A;  . HERNIA REPAIR     in childhood  . INGUINAL HERNIA REPAIR Right 03/29/2018   Procedure: OPEN REPAIR OF RIGHT INGUINAL HERNIA WITH MESH;  Surgeon: Judeth Horn, MD;  Location: [place];  Service: General;  Laterality: Right;  . INSERTION  OF MESH N/A 03/29/2018   Procedure: INSERTION OF MESH;  Surgeon: Judeth Horn, MD;  Location: [place];  Service: General;  Laterality: N/A;  . IR GENERIC HISTORICAL  05/14/2016   IR US GUIDE VASC ACCESS RIGHT 05/14/2016 Corrie Mckusick, DO [facility]  . IR GENERIC HISTORICAL  05/14/2016   IR FLUORO GUIDE PORT INSERTION RIGHT 05/14/2016 Corrie Mckusick, DO [facility]  . IR REMOVAL TUN ACCESS W/ PORT W/O FL MOD SED  11/11/2016  . PARTIAL COLECTOMY N/A 04/10/2016   Procedure: SIGMOID  COLECTOMY WITH COLOSTOMY;  Surgeon: Judeth Horn, MD;  Location: [place];  Service: General;  Laterality: N/A;  . TONSILLECTOMY    . TOTAL KNEE ARTHROPLASTY Left 09/29/2019   Procedure: TOTAL KNEE ARTHROPLASTY;  Surgeon: Paralee Cancel, MD;  [facility]: [facility];  Service: Orthopedics;  Laterality: Left;  70 mins    There were no vitals filed for this visit.   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 No/denies  [place] Adult PT Treatment/Exercise - 10/18/19 0001      Knee/Hip Exercises: Stretches   Gastroc Stretch Left;2 reps;20 seconds      Knee/Hip Exercises: Aerobic   Recumbent Bike able to do backwards;       Knee/Hip Exercises: Standing   Heel Raises Both;20 reps    Knee Flexion Strengthening;Both   alt LE tapping 4" step   Terminal Knee Extension Strengthening;Left;10 reps;Theraband    Theraband Level (Terminal Knee Extension) Level 3 (Green)    Gait Training weight shift 3 ways - heel to toe promoted when Lt LE in front; side to side and fwd/back - 20x each      Knee/Hip Exercises: Seated   Long Arc Quad Strengthening;Left;15 reps    Long Arc Quad Weight --   1.5#   Knee/Hip Flexion seated on mat table with foam pad - 1.5lb 15x      Knee/Hip Exercises: Supine   Straight Leg Raises Strengthening;Left;10 reps    support from PT due to lag   Knee Flexion AAROM;10 reps    Knee Flexion Limitations 10 reps on step; 5 x 10 sec on mat with strap - up to 109 deg      Vasopneumatic   Number Minutes Vasopneumatic  8 minutes    Vasopnuematic Location  Knee    Vasopneumatic Pressure Medium    Vasopneumatic Temperature  1 flake      Manual Therapy   Manual Therapy Edema management;Soft tissue mobilization    Edema Management effluerage towards the heart    Soft tissue mobilization quad and gentle scar massageup and down in same direction in either side of the incision          stopped vaso 2 minutes early for 8 min total          PT Short Term Goals - 10/18/19 1445      PT SHORT TERM GOAL #1   Title ind with initial HEP    Status Achieved      PT SHORT TERM GOAL #2   Title Pt will be able to tolerate at least 10 min of standing/walking for household activity.    Status On-going             PT Long Term Goals - 10/10/19 1259      PT LONG TERM GOAL #1   Title Pt will achieve Lt LE strength of at least 4+/5 for improved transfers, gait endurance and stairs.    Time 8    Period Weeks    Status New    Target Date 12/05/19      PT LONG TERM GOAL #2   Title Improved Lt knee A/ROM to at least -5 ext and 115 flexion to normalize gait and functional squatting range.    Baseline -15 to 88    Time 8    Period Weeks    Status New    Target Date 12/05/19      PT LONG TERM GOAL #3   Title able to demo stairs with alternating pattern with single rail    Time 8    Period Weeks    Status New    Target Date 12/05/19      PT LONG TERM GOAL #4   Title Pt will be able to tolerate standing household and community activities x at least 30 min with pain not to exceed 3/10 in Lt knee.    Time 8    Period Weeks    Status  New    Target Date 12/05/19      PT LONG TERM GOAL #5   Title reduce FOTO to </= 51% limitation for improved function    Baseline 78%    Time 8    Period Weeks     Status New    Target Date 12/05/19                 Plan - 10/18/19 1441    Clinical Impression Statement Pt did well with treatment today.  He expressed the most discomfort with the knee flexion ROM.  Pt continue to have decreasedextension ROM.  Pt was educated on heel strike with fwd/back rocking and will need follow up on gait mechanics.  He will continue to need skilled PT for improved ROM and strength to return to all functional and work related activities.    PT Treatment/Interventions ADLs/Self Care Home Management;Cryotherapy;Electrical Stimulation;Moist Heat;Neuromuscular re-education;Therapeutic exercise;Functional mobility training;Stair training;Gait training;Therapeutic activities;Patient/family education;Manual techniques;Dry needling;Passive range of motion;Scar mobilization;Taping;Joint Manipulations    PT Next Visit Plan nustep or bike warm up; heel strike and knee extension, porgress quad strength continue knee flexion ROM, try estim and ice post treatment - hasn't been liking vaso even after varying setting    PT Home Exercise Plan Access Code: [email or web address]    Consulted and Agree with Plan of Care Patient           Patient will benefit from skilled therapeutic intervention in order to improve the following deficits and impairments:  Abnormal gait, Decreased range of motion, Difficulty walking, Decreased activity tolerance, Pain, Hypomobility, Impaired flexibility, Decreased strength, Postural dysfunction, Increased edema  Visit Diagnosis: Chronic pain of left knee  Muscle weakness (generalized)  Chronic pain of right knee     Problem List Patient Active Problem List   Diagnosis Date Noted  . S/P left TKA 09/29/2019  . Status post total left knee replacement 09/29/2019  . Preoperative cardiovascular examination 08/18/2019  . S/P right inguinal hernia repair 03/29/2018  . New onset left bundle branch block (LBBB) 01/12/2018  . Atherosclerotic vascular disease  01/12/2018  . 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 Port catheter in place 07/02/2016  . 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 Elevated fasting glucose 04/10/2016  . Right inguinal hernia 04/10/2016  . Adenocarcinoma of colon ([place]) 04/10/2016  . Enterocolitis   . Essential hypertension 04/04/2016  . Acute kidney injury superimposed on chronic kidney disease Stage 2 ([place]) 04/04/2016  . Ileus ([place]) 04/02/2016    Jule Ser, PT 10/18/2019, 2:50 PM   [facility] [address], [place], [place], [address] Phone: [phone]   Fax:  [phone]  Name: Louis [name] MRN: [id number] Date of Birth: 1945/11/07

## 2019-10-19 ENCOUNTER — Encounter: Payer: Self-pay | Admitting: Physical Therapy

## 2019-10-19 ENCOUNTER — Other Ambulatory Visit: Payer: Self-pay

## 2019-10-19 ENCOUNTER — Ambulatory Visit: Payer: Medicare Other | Admitting: Physical Therapy

## 2019-10-19 DIAGNOSIS — R609 Edema, unspecified: Secondary | ICD-10-CM | POA: Diagnosis not present

## 2019-10-19 DIAGNOSIS — M6281 Muscle weakness (generalized): Secondary | ICD-10-CM

## 2019-10-19 DIAGNOSIS — G8929 Other chronic pain: Secondary | ICD-10-CM

## 2019-10-19 DIAGNOSIS — M25562 Pain in left knee: Secondary | ICD-10-CM | POA: Diagnosis not present

## 2019-10-19 DIAGNOSIS — M25561 Pain in right knee: Secondary | ICD-10-CM | POA: Diagnosis not present

## 2019-10-19 NOTE — Therapy (Signed)
[facility] [address] [place], [place], [address] Phone: [phone]   Fax:  [phone]  Physical Therapy Treatment  Patient Details  Name: Louis [name] MRN: [id number] Date of Birth: 11-11-1945 Referring Provider (PT): Paralee Cancel, MD   Encounter Date: 10/19/2019   PT End of Session - 10/19/19 1526    Visit Number 4    Date for PT Re-Evaluation 12/05/19    Authorization Type Medicare    PT Start Time 8921    PT Stop Time 1540    PT Time Calculation (min) 55 min    Activity Tolerance Patient tolerated treatment well;No increased pain    Behavior During Therapy [facility] for tasks assessed/performed           Past Medical History:  Diagnosis Date  . Arthritis   . Ascending aorta dilatation (HCC)    4.1 cm noted on CT1/15/21  . CKD (chronic kidney disease) stage 2, GFR 60-89 ml/min    see [place], Stage 1  . colon ca dx'd 04/2016   colon  . Hernia, abdominal   . Hyperlipidemia   . Hypertension   . LBBB (left bundle branch block) 08/18/2019  . Neuropathy   . Pneumonia 05/19/2018  . Seasonal allergies   . 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    Past Surgical History:  Procedure Laterality Date  . BIOPSY  04/10/2016   Procedure: BIOPSY Mesentary;  Surgeon: Judeth Horn, MD;  Location: [place];  Service: General;;  . COLOSTOMY    . COLOSTOMY REVERSAL N/A 01/12/2017   Procedure: COLOSTOMY REVERSAL;  Surgeon: Judeth Horn, MD;  Location: [place];  Service: General;  Laterality: N/A;  . FLEXIBLE SIGMOIDOSCOPY N/A 04/09/2016   Procedure: Beryle Quant;  Surgeon: Ladene Artist, MD;  Location: [facility];  Service: Endoscopy;  Laterality: N/A;  . HERNIA REPAIR     in childhood  . INGUINAL HERNIA REPAIR Right 03/29/2018   Procedure: OPEN REPAIR OF RIGHT INGUINAL HERNIA WITH MESH;  Surgeon: Judeth Horn, MD;  Location: [place];  Service: General;  Laterality: Right;  . INSERTION OF MESH N/A  03/29/2018   Procedure: INSERTION OF MESH;  Surgeon: Judeth Horn, MD;  Location: [place];  Service: General;  Laterality: N/A;  . IR GENERIC HISTORICAL  05/14/2016   IR US GUIDE VASC ACCESS RIGHT 05/14/2016 Corrie Mckusick, DO [facility]  . IR GENERIC HISTORICAL  05/14/2016   IR FLUORO GUIDE PORT INSERTION RIGHT 05/14/2016 Corrie Mckusick, DO [facility]  . IR REMOVAL TUN ACCESS W/ PORT W/O FL MOD SED  11/11/2016  . PARTIAL COLECTOMY N/A 04/10/2016   Procedure: SIGMOID  COLECTOMY WITH COLOSTOMY;  Surgeon: Judeth Horn, MD;  Location: [place];  Service: General;  Laterality: N/A;  . TONSILLECTOMY    . TOTAL KNEE ARTHROPLASTY Left 09/29/2019   Procedure: TOTAL KNEE ARTHROPLASTY;  Surgeon: Paralee Cancel, MD;  [facility];  Service: Orthopedics;  Laterality: Left;  70 mins    There were no vitals filed for this visit.   Subjective Assessment - 10/19/19 1449    Subjective I am a little tight from yesterday.    Limitations Walking;Standing;Sitting;House hold activities    How long can you sit comfortably? 1+ hours    How long can you stand comfortably? 5 min    How long can you walk comfortably? 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 Yes  Pain Score 3     Pain Location Knee    Pain Orientation Left    Pain Descriptors / Indicators Sore    Pain Type Surgical pain    Pain Onset 1 to 4 weeks ago    Pain Frequency Intermittent    Aggravating Factors  bending his knee    Pain Relieving Factors ice    Multiple Pain Sites No              [facility] PT Assessment - 10/19/19 0001      Assessment   Medical Diagnosis Z96.652 (ICD-10-CM) - Presence of left artificial knee joint    Referring Provider (PT) Paralee Cancel, MD    Onset Date/Surgical Date 09/29/19    Hand Dominance Left    Next MD Visit 10/12/19    Prior Therapy yes, 2 visits with Dr. Aurea Graff office      Precautions   Precautions None                          [facility] Adult PT Treatment/Exercise - 10/19/19 0001      Self-Care   Self-Care Other Self-Care Comments    Other Self-Care Comments  adjusted the cane to fit his height       Knee/Hip Exercises: Stretches   Gastroc Stretch Left;2 reps;20 seconds    Other Knee/Hip Stretches standing with left leg on higher step and rock back and forth      Knee/Hip Exercises: Aerobic   Recumbent Bike level 1 for 6 minutes while assessing patient      Knee/Hip Exercises: Standing   Terminal Knee Extension Strengthening;Left;10 reps;Theraband    Theraband Level (Terminal Knee Extension) Level 3 (Green)    Rebounder weight shift all three ways 1 minute each      Knee/Hip Exercises: Seated   Long Arc Quad Strengthening;Left;15 reps    Long Arc Quad [employer] --   1.5#   Long [employer] Limitations with ball squeeze, chair with cushion      Modalities   Modalities Vasopneumatic      Vasopneumatic   Number Minutes Vasopneumatic  15 minutes    Vasopnuematic Location  Knee    Vasopneumatic Pressure Medium    Vasopneumatic Temperature  1 flake      Manual Therapy   Manual Therapy Soft tissue mobilization;Passive ROM    Soft tissue mobilization left knee, left quadricep, left calf, left posterior knee, along the scar    Passive ROM to left knee for flexion and extension                    PT Short Term Goals - 10/18/19 1445      PT SHORT TERM GOAL #1   Title ind with initial HEP    Status Achieved      PT SHORT TERM GOAL #2   Title Pt will be able to tolerate at least 10 min of standing/walking for household activity.    Status On-going             PT Long Term Goals - 10/10/19 1259      PT LONG TERM GOAL #1   Title Pt will achieve Lt LE strength of at least 4+/5 for improved transfers, gait endurance and stairs.    Time 8    Period Weeks    Status New    Target Date 12/05/19      PT LONG TERM GOAL #2  Title Improved Lt knee A/ROM to at least -5  ext and 115 flexion to normalize gait and functional squatting range.    Baseline -15 to 88    Time 8    Period Weeks    Status New    Target Date 12/05/19      PT LONG TERM GOAL #3   Title able to demo stairs with alternating pattern with single rail    Time 8    Period Weeks    Status New    Target Date 12/05/19      PT LONG TERM GOAL #4   Title Pt will be able to tolerate standing household and community activities x at least 30 min with pain not to exceed 3/10 in Lt knee.    Time 8    Period Weeks    Status New    Target Date 12/05/19      PT LONG TERM GOAL #5   Title reduce FOTO to </= 51% limitation for improved function    Baseline 78%    Time 8    Period Weeks    Status New    Target Date 12/05/19                 Plan - 10/19/19 1527    Clinical Impression Statement Patient cane was too small for him so therapist adjusted it. Patient has limited left knee extension. Patient has trouble extending his left knee with walking. Patient is not able to fully engage his left quadricep with quad set. Patient will benefit from skilled therapy to improve left knee ROM and strength.    Personal Factors and Comorbidities Time since onset of injury/illness/exacerbation    Examination-Activity Limitations Locomotion Level;Transfers;Squat;Stairs;Sit;Stand    Examination-Participation Restrictions Community Activity;Driving;Yard Work;Shop    Stability/Clinical Decision Making Stable/Uncomplicated    Rehab Potential Good    PT Frequency 3x / week    PT Duration 8 weeks    PT Treatment/Interventions ADLs/Self Care Home Management;Cryotherapy;Electrical Stimulation;Moist Heat;Neuromuscular re-education;Therapeutic exercise;Functional mobility training;Stair training;Gait training;Therapeutic activities;Patient/family education;Manual techniques;Dry needling;Passive range of motion;Scar mobilization;Taping;Joint Manipulations    PT Next Visit Plan nustep or bike warm up; heel  strike and knee extension, porgress quad strength continue knee flexion ROM, try estim and ice post treatment - hasn't been liking vaso even after varying setting    PT Home Exercise Plan Access Code: [email or web address]    Consulted and Agree with Plan of Care Patient           Patient will benefit from skilled therapeutic intervention in order to improve the following deficits and impairments:  Abnormal gait, Decreased range of motion, Difficulty walking, Decreased activity tolerance, Pain, Hypomobility, Impaired flexibility, Decreased strength, Postural dysfunction, Increased edema  Visit Diagnosis: Chronic pain of left knee - Plan: PT plan of care cert/re-cert  Muscle weakness (generalized) - Plan: PT plan of care cert/re-cert  Chronic pain of right knee - Plan: PT plan of care cert/re-cert     Problem List Patient Active Problem List   Diagnosis Date Noted  . S/P left TKA 09/29/2019  . Status post total left knee replacement 09/29/2019  . Preoperative cardiovascular examination 08/18/2019  . S/P right inguinal hernia repair 03/29/2018  . New onset left bundle branch block (LBBB) 01/12/2018  . Atherosclerotic vascular disease 01/12/2018  . 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 Port catheter in place 07/02/2016  . Aorto-iliac atherosclerosis ([place]) 04/10/2016  . Elevated fasting glucose 04/10/2016  . Right inguinal hernia 04/10/2016  .  Adenocarcinoma of colon ([place]) 04/10/2016  . Enterocolitis   . Essential hypertension 04/04/2016  . Acute kidney injury superimposed on chronic kidney disease Stage 2 ([place]) 04/04/2016  . Ileus ([place]) 04/02/2016    Louis [name], PT 10/19/19 3:58 PM   [facility] [address], [place], [place], [address] Phone: [phone]   Fax:  [phone]  Name: Louis [name] MRN: [id number] Date of Birth: [birth year]

## 2019-10-21 ENCOUNTER — Ambulatory Visit: Payer: Medicare Other | Admitting: Physical Therapy

## 2019-10-21 ENCOUNTER — Encounter: Payer: Self-pay | Admitting: Physical Therapy

## 2019-10-21 ENCOUNTER — Other Ambulatory Visit: Payer: Self-pay

## 2019-10-21 DIAGNOSIS — M6281 Muscle weakness (generalized): Secondary | ICD-10-CM

## 2019-10-21 DIAGNOSIS — M25561 Pain in right knee: Secondary | ICD-10-CM | POA: Diagnosis not present

## 2019-10-21 DIAGNOSIS — G8929 Other chronic pain: Secondary | ICD-10-CM | POA: Diagnosis not present

## 2019-10-21 DIAGNOSIS — R609 Edema, unspecified: Secondary | ICD-10-CM | POA: Diagnosis not present

## 2019-10-21 DIAGNOSIS — M25562 Pain in left knee: Secondary | ICD-10-CM

## 2019-10-21 NOTE — Therapy (Signed)
[facility] [address], [place], [place], [address] Phone: [phone]   Fax:  [phone]  Physical Therapy Treatment  Patient Details  Name: Louis [name] MRN: [id number] Date of Birth: 05-31-1945 Referring Provider (PT): Paralee Cancel, MD   Encounter Date: 10/21/2019   PT End of Session - 10/21/19 1014    Visit Number 5    Date for PT Re-Evaluation 12/05/19    Authorization Type Medicare    PT Start Time 0930    PT Stop Time 1030    PT Time Calculation (min) 60 min    Activity Tolerance Patient tolerated treatment well;No increased pain    Behavior During Therapy WFL for tasks assessed/performed           Past Medical History:  Diagnosis Date  . Arthritis   . Ascending aorta dilatation (HCC)    4.1 cm noted on CT1/15/21  . CKD (chronic kidney disease) stage 2, GFR 60-89 ml/min    see [place], Stage 1  . colon ca dx'd 04/2016   colon  . Hernia, abdominal   . Hyperlipidemia   . Hypertension   . LBBB (left bundle branch block) 08/18/2019  . Neuropathy   . Pneumonia 05/19/2018  . Seasonal allergies   . 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    Past Surgical History:  Procedure Laterality Date  . BIOPSY  04/10/2016   Procedure: BIOPSY Mesentary;  Surgeon: Judeth Horn, MD;  Location: [place];  Service: General;;  . COLOSTOMY    . COLOSTOMY REVERSAL N/A 01/12/2017   Procedure: COLOSTOMY REVERSAL;  Surgeon: Judeth Horn, MD;  Location: [place];  Service: General;  Laterality: N/A;  . FLEXIBLE SIGMOIDOSCOPY N/A 04/09/2016   Procedure: Beryle Quant;  Surgeon: Ladene Artist, MD;  Location: [facility];  Service: Endoscopy;  Laterality: N/A;  . HERNIA REPAIR     in childhood  . INGUINAL HERNIA REPAIR Right 03/29/2018   Procedure: OPEN REPAIR OF RIGHT INGUINAL HERNIA WITH MESH;  Surgeon: Judeth Horn, MD;  Location: [place];  Service: General;  Laterality: Right;  . INSERTION OF MESH N/A  03/29/2018   Procedure: INSERTION OF MESH;  Surgeon: Judeth Horn, MD;  Location: [place];  Service: General;  Laterality: N/A;  . IR GENERIC HISTORICAL  05/14/2016   IR US GUIDE VASC ACCESS RIGHT 05/14/2016 Corrie Mckusick, DO [facility]  . IR GENERIC HISTORICAL  05/14/2016   IR FLUORO GUIDE PORT INSERTION RIGHT 05/14/2016 Corrie Mckusick, DO [facility]  . IR REMOVAL TUN ACCESS W/ PORT W/O FL MOD SED  11/11/2016  . PARTIAL COLECTOMY N/A 04/10/2016   Procedure: SIGMOID  COLECTOMY WITH COLOSTOMY;  Surgeon: Judeth Horn, MD;  Location: [place];  Service: General;  Laterality: N/A;  . TONSILLECTOMY    . TOTAL KNEE ARTHROPLASTY Left 09/29/2019   Procedure: TOTAL KNEE ARTHROPLASTY;  Surgeon: Paralee Cancel, MD;  [facility];  Service: Orthopedics;  Laterality: Left;  70 mins    There were no vitals filed for this visit.   Subjective Assessment - 10/21/19 0933    Subjective I was sore after last visit.    Limitations Walking;Standing;Sitting;House hold activities    How long can you sit comfortably? 1+ hours    How long can you stand comfortably? 5 min    How long can you walk comfortably? 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 Yes  Pain Score 2     Pain Location Knee    Pain Orientation Left    Pain Descriptors / Indicators Sore    Pain Type Surgical pain    Pain Onset More than a month ago    Pain Frequency Intermittent    Aggravating Factors  bending knee    Pain Relieving Factors ice    Multiple Pain Sites No              [facility] PT Assessment - 10/21/19 0001      AROM   Left Knee Extension -15   with e-stim                        [facility] Adult PT Treatment/Exercise - 10/21/19 0001      Knee/Hip Exercises: Aerobic   Recumbent Bike level 1 for 6 minutes while assessing patient      Knee/Hip Exercises: Standing   Rebounder weight shift all three ways 1 minute each      Knee/Hip Exercises: Seated   Sit  to Sand 1 set;20 reps   with russian stim on     Knee/Hip Exercises: Supine   Short Arc [employer] Strengthening;Left    Short Arc [employer] Limitations with the russian stimulation on to work the [employer] Leg Raises Strengthening;Left    Straight Leg Raises Limitations while russian stim on and VC to keep knee straight      Modalities   Modalities Psychologist, educational Location left quad    Sports coach Stimulation Parameters 5/5, 50 bps, ramp 2 sec, duty cycle 50%      Vasopneumatic   Number Minutes Vasopneumatic  15 minutes    Vasopnuematic Location  Knee    Vasopneumatic Pressure Medium    Vasopneumatic Temperature  1 flake      Manual Therapy   Manual Therapy Soft tissue mobilization;Passive ROM    Soft tissue mobilization left knee, left quadricep, left calf, left posterior knee, along the scar    Passive ROM to left knee for flexion and extension                    PT Short Term Goals - 10/18/19 1445      PT SHORT TERM GOAL #1   Title ind with initial HEP    Status Achieved      PT SHORT TERM GOAL #2   Title Pt will be able to tolerate at least 10 min of standing/walking for household activity.    Status On-going             PT Long Term Goals - 10/10/19 1259      PT LONG TERM GOAL #1   Title Pt will achieve Lt LE strength of at least 4+/5 for improved transfers, gait endurance and stairs.    Time 8    Period Weeks    Status New    Target Date 12/05/19      PT LONG TERM GOAL #2   Title Improved Lt knee A/ROM to at least -5 ext and 115 flexion to normalize gait and functional squatting range.    Baseline -15 to 88    Time 8    Period Weeks    Status New    Target Date 12/05/19      PT LONG TERM GOAL #3  Title able to demo stairs with alternating pattern with single rail    Time 8    Period Weeks    Status New    Target  Date 12/05/19      PT LONG TERM GOAL #4   Title Pt will be able to tolerate standing household and community activities x at least 30 min with pain not to exceed 3/10 in Lt knee.    Time 8    Period Weeks    Status New    Target Date 12/05/19      PT LONG TERM GOAL #5   Title reduce FOTO to </= 51% limitation for improved function    Baseline 78%    Time 8    Period Weeks    Status New    Target Date 12/05/19                 Plan - 10/21/19 1014    Clinical Impression Statement In therapy worked on left quadricep contraction with exercise using the [place] stimulation unit on quads. Patient was able to extend his left knee to -15 degrees with the stim on but when off not able to contract the quadricep as well. Patient gets sore after therapy. He does better with a pillow case to cover the knee with vasopnuematic device on. Patient will benefit from skilled therapy to improve left knee ROM and strength.    Personal Factors and Comorbidities Time since onset of injury/illness/exacerbation    Examination-Activity Limitations Locomotion Level;Transfers;Squat;Stairs;Sit;Stand    Examination-Participation Restrictions Community Activity;Driving;Yard Work;Shop    Stability/Clinical Decision Making Stable/Uncomplicated    Rehab Potential Good    PT Frequency 3x / week    PT Duration 8 weeks    PT Treatment/Interventions ADLs/Self Care Home Management;Cryotherapy;Electrical Stimulation;Moist Heat;Neuromuscular re-education;Therapeutic exercise;Functional mobility training;Stair training;Gait training;Therapeutic activities;Patient/family education;Manual techniques;Dry needling;Passive range of motion;Scar mobilization;Taping;Joint Manipulations    PT Next Visit Plan nustep or bike warm up; heel strike and knee extension, porgress quad strength continue knee flexion ROM, try estim and ice post treatment -, use towel with vaso    PT Home Exercise Plan Access Code: [email or web address]    Recommended  Other Services MD signed initial eval    Consulted and Agree with Plan of Care Patient           Patient will benefit from skilled therapeutic intervention in order to improve the following deficits and impairments:  Abnormal gait, Decreased range of motion, Difficulty walking, Decreased activity tolerance, Pain, Hypomobility, Impaired flexibility, Decreased strength, Postural dysfunction, Increased edema  Visit Diagnosis: Chronic pain of left knee  Muscle weakness (generalized)  Chronic pain of right knee     Problem List Patient Active Problem List   Diagnosis Date Noted  . S/P left TKA 09/29/2019  . Status post total left knee replacement 09/29/2019  . Preoperative cardiovascular examination 08/18/2019  . S/P right inguinal hernia repair 03/29/2018  . New onset left bundle branch block (LBBB) 01/12/2018  . Atherosclerotic vascular disease 01/12/2018  . 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 Port catheter in place 07/02/2016  . 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 Elevated fasting glucose 04/10/2016  . Right inguinal hernia 04/10/2016  . Adenocarcinoma of colon ([place]) 04/10/2016  . Enterocolitis   . Essential hypertension 04/04/2016  . Acute kidney injury superimposed on chronic kidney disease Stage 2 ([place]) 04/04/2016  . Ileus ([place]) 04/02/2016    Earlie Counts, PT 10/21/19 10:19 AM   [facility] [address], [place], [place], [address] Phone: [phone]   Fax:  [phone]  Name: KAVISH LAFITTE MRN: [id number] Date of Birth: 1945/10/03

## 2019-10-24 ENCOUNTER — Ambulatory Visit: Payer: Medicare Other | Admitting: Physical Therapy

## 2019-10-24 ENCOUNTER — Encounter: Payer: Self-pay | Admitting: Physical Therapy

## 2019-10-24 ENCOUNTER — Other Ambulatory Visit: Payer: Self-pay

## 2019-10-24 DIAGNOSIS — R609 Edema, unspecified: Secondary | ICD-10-CM | POA: Diagnosis not present

## 2019-10-24 DIAGNOSIS — G8929 Other chronic pain: Secondary | ICD-10-CM | POA: Diagnosis not present

## 2019-10-24 DIAGNOSIS — M6281 Muscle weakness (generalized): Secondary | ICD-10-CM

## 2019-10-24 DIAGNOSIS — M25561 Pain in right knee: Secondary | ICD-10-CM | POA: Diagnosis not present

## 2019-10-24 DIAGNOSIS — M25562 Pain in left knee: Secondary | ICD-10-CM

## 2019-10-24 NOTE — Therapy (Signed)
[facility] [address], [place], [place], [address] Phone: [phone]   Fax:  [phone]  Physical Therapy Treatment  Patient Details  Name: Louis [name] MRN: [id number] Date of Birth: Nov 17, 1945 Referring Provider (PT): Paralee Cancel, MD   Encounter Date: 10/24/2019   PT End of Session - 10/24/19 1225    Visit Number 6    Date for PT Re-Evaluation 12/05/19    Authorization Type Medicare    Progress Note Due on Visit 10    PT Start Time 2440    PT Stop Time 1240    PT Time Calculation (min) 55 min    Activity Tolerance Patient tolerated treatment well;No increased pain    Behavior During Therapy WFL for tasks assessed/performed           Past Medical History:  Diagnosis Date  . Arthritis   . Ascending aorta dilatation (HCC)    4.1 cm noted on CT1/15/21  . CKD (chronic kidney disease) stage 2, GFR 60-89 ml/min    see [place], Stage 1  . colon ca dx'd 04/2016   colon  . Hernia, abdominal   . Hyperlipidemia   . Hypertension   . LBBB (left bundle branch block) 08/18/2019  . Neuropathy   . Pneumonia 05/19/2018  . Seasonal allergies   . 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    Past Surgical History:  Procedure Laterality Date  . BIOPSY  04/10/2016   Procedure: BIOPSY Mesentary;  Surgeon: Judeth Horn, MD;  Location: [place];  Service: General;;  . COLOSTOMY    . COLOSTOMY REVERSAL N/A 01/12/2017   Procedure: COLOSTOMY REVERSAL;  Surgeon: Judeth Horn, MD;  Location: [place];  Service: General;  Laterality: N/A;  . FLEXIBLE SIGMOIDOSCOPY N/A 04/09/2016   Procedure: Beryle Quant;  Surgeon: Ladene Artist, MD;  Location: [facility];  Service: Endoscopy;  Laterality: N/A;  . HERNIA REPAIR     in childhood  . INGUINAL HERNIA REPAIR Right 03/29/2018   Procedure: OPEN REPAIR OF RIGHT INGUINAL HERNIA WITH MESH;  Surgeon: Judeth Horn, MD;  Location: [place];  Service: General;  Laterality:  Right;  . INSERTION OF MESH N/A 03/29/2018   Procedure: INSERTION OF MESH;  Surgeon: Judeth Horn, MD;  Location: [place];  Service: General;  Laterality: N/A;  . IR GENERIC HISTORICAL  05/14/2016   IR US GUIDE VASC ACCESS RIGHT 05/14/2016 Corrie Mckusick, DO [facility]  . IR GENERIC HISTORICAL  05/14/2016   IR FLUORO GUIDE PORT INSERTION RIGHT 05/14/2016 Corrie Mckusick, DO [facility]  . IR REMOVAL TUN ACCESS W/ PORT W/O FL MOD SED  11/11/2016  . PARTIAL COLECTOMY N/A 04/10/2016   Procedure: SIGMOID  COLECTOMY WITH COLOSTOMY;  Surgeon: Judeth Horn, MD;  Location: [place];  Service: General;  Laterality: N/A;  . TONSILLECTOMY    . TOTAL KNEE ARTHROPLASTY Left 09/29/2019   Procedure: TOTAL KNEE ARTHROPLASTY;  Surgeon: Paralee Cancel, MD;  [facility];  Service: Orthopedics;  Laterality: Left;  70 mins    There were no vitals filed for this visit.   Subjective Assessment - 10/24/19 1149    Subjective I feel like the stim helped with the muscle.    Limitations Walking;Standing;Sitting;House hold activities    How long can you sit comfortably? 1+ hours    How long can you stand comfortably? 5 min    How long can you walk comfortably? 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 Yes    Pain Score 2     Pain Location Knee    Pain Orientation Left    Pain Descriptors / Indicators Sore    Pain Type Surgical pain    Pain Onset More than a month ago    Pain Frequency Intermittent    Aggravating Factors  bending knee    Pain Relieving Factors ice    Multiple Pain Sites No              [facility] PT Assessment - 10/24/19 0001      PROM   Left Knee Extension -10                         [facility] Adult PT Treatment/Exercise - 10/24/19 0001      Knee/Hip Exercises: Stretches   Hip Flexor Stretch Left;3 reps;30 seconds    Hip Flexor Stretch Limitations therapist pushing the knee into extension    Gastroc Stretch Both;2 reps;30  seconds      Knee/Hip Exercises: Aerobic   Recumbent Bike level 2 for 4 minutes, level 4 for 2 minutes  while assessing patient   moving seat forward 3 times      Knee/Hip Exercises: Machines for Strengthening   Cybex Leg Press 2 legs 70# 2x15; left leg 35# 15x with therapist assisting with endrange extension      Knee/Hip Exercises: Standing   Forward Step Up Left;15 reps    Forward Step Up Limitations with therapist helping with end range extension    Step Down Left;1 set;15 reps    Step Down Limitations 6 inch to 4 inch, then 6 inch to 2 inch    Other Standing Knee Exercises right foot on slider and moves forward then straighten the left knee 15 forward and 15 times sideways      Knee/Hip Exercises: Supine   Short Arc Quad Sets Strengthening;Left;1 set;20 reps    Short Arc Quad Sets Limitations 2#, white foam roll      Modalities   Modalities Vasopneumatic      Vasopneumatic   Number Minutes Vasopneumatic  5 minutes    Vasopnuematic Location  Knee    Vasopneumatic Pressure Medium    Vasopneumatic Temperature  1 flake      Manual Therapy   Manual Therapy Soft tissue mobilization    Soft tissue mobilization left knee, left quadricep, left calf, left posterior knee, along the scar    Passive ROM to left knee for flexion and extension                    PT Short Term Goals - 10/24/19 1231      PT SHORT TERM GOAL #1   Title ind with initial HEP    Time 3    Period Weeks    Status Achieved      PT SHORT TERM GOAL #2   Title Pt will be able to tolerate at least 10 min of standing/walking for household activity.    Baseline 5'    Time 4    Period Weeks    Status Achieved      PT SHORT TERM GOAL #3   Title Lt knee ROM at least -10 ext and 95 flexion    Time 4    Period Weeks    Status Achieved             PT Long Term  Goals - 10/10/19 1259      PT LONG TERM GOAL #1   Title Pt will achieve Lt LE strength of at least 4+/5 for improved transfers, gait  endurance and stairs.    Time 8    Period Weeks    Status New    Target Date 12/05/19      PT LONG TERM GOAL #2   Title Improved Lt knee A/ROM to at least -5 ext and 115 flexion to normalize gait and functional squatting range.    Baseline -15 to 88    Time 8    Period Weeks    Status New    Target Date 12/05/19      PT LONG TERM GOAL #3   Title able to demo stairs with alternating pattern with single rail    Time 8    Period Weeks    Status New    Target Date 12/05/19      PT LONG TERM GOAL #4   Title Pt will be able to tolerate standing household and community activities x at least 30 min with pain not to exceed 3/10 in Lt knee.    Time 8    Period Weeks    Status New    Target Date 12/05/19      PT LONG TERM GOAL #5   Title reduce FOTO to </= 51% limitation for improved function    Baseline 78%    Time 8    Period Weeks    Status New    Target Date 12/05/19                 Plan - 10/24/19 1227    Clinical Impression Statement Patient has increased left knee extension by 5 degrees. Patient is able to extend his left knee better with increased in quad contraction. The electrical stimulation helped increase left quad firing. Patient was able to do the leg press. He is not using his cane at home. He is doing step to step pattern but will start at home step over step going up. The distal part of scar has increased tightness. Patient will benefit from skilled therapy to improve left knee ROM and strength.    Personal Factors and Comorbidities Time since onset of injury/illness/exacerbation    Examination-Activity Limitations Locomotion Level;Transfers;Squat;Stairs;Sit;Stand    Examination-Participation Restrictions Community Activity;Driving;Yard Work;Shop    Stability/Clinical Decision Making Stable/Uncomplicated    Rehab Potential Good    PT Frequency 3x / week    PT Duration 8 weeks    PT Treatment/Interventions ADLs/Self Care Home  Management;Cryotherapy;Electrical Stimulation;Moist Heat;Neuromuscular re-education;Therapeutic exercise;Functional mobility training;Stair training;Gait training;Therapeutic activities;Patient/family education;Manual techniques;Dry needling;Passive range of motion;Scar mobilization;Taping;Joint Manipulations    PT Next Visit Plan sit to stand, squats, stairs; leg press, bike with increased resistance    PT Home Exercise Plan Access Code: [email or web address]    Consulted and Agree with Plan of Care Patient           Patient will benefit from skilled therapeutic intervention in order to improve the following deficits and impairments:  Abnormal gait, Decreased range of motion, Difficulty walking, Decreased activity tolerance, Pain, Hypomobility, Impaired flexibility, Decreased strength, Postural dysfunction, Increased edema  Visit Diagnosis: Chronic pain of left knee  Muscle weakness (generalized)  Chronic pain of right knee  Edema, unspecified type     Problem List Patient Active Problem List   Diagnosis Date Noted  . S/P left TKA 09/29/2019  . Status post total left knee replacement 09/29/2019  .  Preoperative cardiovascular examination 08/18/2019  . S/P right inguinal hernia repair 03/29/2018  . New onset left bundle branch block (LBBB) 01/12/2018  . Atherosclerotic vascular disease 01/12/2018  . 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 Port catheter in place 07/02/2016  . Aorto-iliac atherosclerosis ([place]) 04/10/2016  . Elevated fasting glucose 04/10/2016  . Right inguinal hernia 04/10/2016  . Adenocarcinoma of colon ([place]) 04/10/2016  . Enterocolitis   . Essential hypertension 04/04/2016  . Acute kidney injury superimposed on chronic kidney disease Stage 2 ([place]) 04/04/2016  . Ileus ([place]) 04/02/2016    Earlie Counts, PT 10/24/19 12:33 PM   [facility] [address] [place], [place], [address] Phone: [phone]   Fax:   [phone]  Name: LENNARD CAPEK MRN: [id number] Date of Birth: 17-Nov-1945

## 2019-10-26 ENCOUNTER — Encounter: Payer: Self-pay | Admitting: Physical Therapy

## 2019-10-26 ENCOUNTER — Other Ambulatory Visit: Payer: Self-pay

## 2019-10-26 ENCOUNTER — Ambulatory Visit: Payer: Medicare Other | Admitting: Physical Therapy

## 2019-10-26 DIAGNOSIS — R609 Edema, unspecified: Secondary | ICD-10-CM

## 2019-10-26 DIAGNOSIS — M25561 Pain in right knee: Secondary | ICD-10-CM

## 2019-10-26 DIAGNOSIS — M25562 Pain in left knee: Secondary | ICD-10-CM

## 2019-10-26 DIAGNOSIS — M6281 Muscle weakness (generalized): Secondary | ICD-10-CM | POA: Diagnosis not present

## 2019-10-26 DIAGNOSIS — G8929 Other chronic pain: Secondary | ICD-10-CM | POA: Diagnosis not present

## 2019-10-26 NOTE — Therapy (Signed)
[facility] [address] [place], [place], [address] Phone: [phone]   Fax:  [phone]  Physical Therapy Treatment  Patient Details  Name: Louis [name] MRN: [id number] Date of Birth: 04/20/1946 Referring Provider (PT): Paralee Cancel, MD   Encounter Date: 10/26/2019   PT End of Session - 10/26/19 0850    Visit Number 7    Date for PT Re-Evaluation 12/05/19    Authorization Type Medicare    Progress Note Due on Visit 10    PT Start Time 0845    PT Stop Time 0940    PT Time Calculation (min) 55 min    Activity Tolerance Patient tolerated treatment well;No increased pain    Behavior During Therapy [facility] for tasks assessed/performed           Past Medical History:  Diagnosis Date   Arthritis    Ascending aorta dilatation (HCC)    4.1 cm noted on CT1/15/21   CKD (chronic kidney disease) stage 2, GFR 60-89 ml/min    see [place], Stage 1   colon ca dx'd 04/2016   colon   Hernia, abdominal    Hyperlipidemia    Hypertension    LBBB (left bundle branch block) 08/18/2019   Neuropathy    Pneumonia 05/19/2018   Seasonal allergies    Thrombocytopenia (HCC)    during chemo.    Past Surgical History:  Procedure Laterality Date   BIOPSY  04/10/2016   Procedure: BIOPSY Mesentary;  Surgeon: Judeth Horn, MD;  Location: [place];  Service: General;;   COLOSTOMY     COLOSTOMY REVERSAL N/A 01/12/2017   Procedure: COLOSTOMY REVERSAL;  Surgeon: Judeth Horn, MD;  Location: [place];  Service: General;  Laterality: N/A;   FLEXIBLE SIGMOIDOSCOPY N/A 04/09/2016   Procedure: FLEXIBLE SIGMOIDOSCOPY;  Surgeon: Ladene Artist, MD;  Location: [facility];  Service: Endoscopy;  Laterality: N/A;   HERNIA REPAIR     in childhood   INGUINAL HERNIA REPAIR Right 03/29/2018   Procedure: OPEN REPAIR OF RIGHT INGUINAL HERNIA WITH MESH;  Surgeon: Judeth Horn, MD;  Location: [place];  Service: General;  Laterality:  Right;   INSERTION OF MESH N/A 03/29/2018   Procedure: INSERTION OF MESH;  Surgeon: Judeth Horn, MD;  Location: [place];  Service: General;  Laterality: N/A;   IR GENERIC HISTORICAL  05/14/2016   IR US GUIDE VASC ACCESS RIGHT 05/14/2016 Corrie Mckusick, DO [facility]   IR GENERIC HISTORICAL  05/14/2016   IR FLUORO GUIDE PORT INSERTION RIGHT 05/14/2016 Corrie Mckusick, DO [facility]   IR REMOVAL TUN ACCESS W/ PORT W/O FL MOD SED  11/11/2016   PARTIAL COLECTOMY N/A 04/10/2016   Procedure: SIGMOID  COLECTOMY WITH COLOSTOMY;  Surgeon: Judeth Horn, MD;  Location: [place];  Service: General;  Laterality: N/A;   TONSILLECTOMY     TOTAL KNEE ARTHROPLASTY Left 09/29/2019   Procedure: TOTAL KNEE ARTHROPLASTY;  Surgeon: Paralee Cancel, MD;  [facility]: [facility];  Service: Orthopedics;  Laterality: Left;  70 mins    There were no vitals filed for this visit.   Subjective Assessment - 10/26/19 0848    Subjective I only have pain when I move it.    Limitations Walking;Standing;Sitting;House hold activities    How long can you sit comfortably? 1+ hours    How long can you stand comfortably? 5 min    How long can you walk comfortably? 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 Yes    Pain Score 2     Pain Location Knee    Pain Orientation Left    Pain Descriptors / Indicators Sore    Pain Type Surgical pain    Pain Onset More than a month ago    Pain Frequency Intermittent    Aggravating Factors  bending knee    Pain Relieving Factors ice    Multiple Pain Sites No                             [facility] Adult PT Treatment/Exercise - 10/26/19 0001      Ambulation/Gait   Gait Comments walking without cane and using a heel toe gait an dincrease in lefft quad contraction      Knee/Hip Exercises: Stretches   Hip Flexor Stretch Left;3 reps;30 seconds    Hip Flexor Stretch Limitations therapist pushing the knee into extension     Gastroc Stretch Both;2 reps;30 seconds      Knee/Hip Exercises: Aerobic   Recumbent Bike level 2 for 4 minutes, level 4 for 2 minutes  while assessing patient   moving seat forward 3 times      Knee/Hip Exercises: Machines for Strengthening   Cybex Leg Press 2 legs 70# 2x15; left leg 35# 15x with therapist assisting with endrange extension   seat #9recline #4     Knee/Hip Exercises: Standing   Other Standing Knee Exercises right foot on slider and moves forward then straighten the left knee 15 forward and 15 times sideways    Other Standing Knee Exercises going up and down stairs with step over step pattern using handrails      Knee/Hip Exercises: Seated   Sit to Sand 1 set;10 reps;without UE support   mat plus 6 in. step, then 4 inch step     Modalities   Modalities Vasopneumatic      Vasopneumatic   Number Minutes Vasopneumatic  5 minutes    Vasopnuematic Location  Knee    Vasopneumatic Pressure Medium    Vasopneumatic Temperature  1 flake      Manual Therapy   Manual Therapy Soft tissue mobilization    Soft tissue mobilization left knee, left quadricep, left calf, left posterior knee, along the scar   had towel roll under left ankle   Passive ROM to left knee for flexion and extension                    PT Short Term Goals - 10/24/19 1231      PT SHORT TERM GOAL #1   Title ind with initial HEP    Time 3    Period Weeks    Status Achieved      PT SHORT TERM GOAL #2   Title Pt will be able to tolerate at least 10 min of standing/walking for household activity.    Baseline 5'    Time 4    Period Weeks    Status Achieved      PT SHORT TERM GOAL #3   Title Lt knee ROM at least -10 ext and 95 flexion    Time 4    Period Weeks    Status Achieved             PT Long Term Goals - 10/26/19 3267      PT LONG TERM GOAL #1   Title Pt will achieve Lt  LE strength of at least 4+/5 for improved transfers, gait endurance and stairs.    Time 8    Period Weeks      Status On-going      PT LONG TERM GOAL #2   Title Improved Lt knee A/ROM to at least -5 ext and 115 flexion to normalize gait and functional squatting range.    Time 8    Period Weeks    Status On-going      PT LONG TERM GOAL #3   Title able to demo stairs with alternating pattern with single rail    Time 8    Period Weeks    Status On-going      PT LONG TERM GOAL #4   Title Pt will be able to tolerate standing household and community activities x at least 30 min with pain not to exceed 3/10 in Lt knee.    Time 8    Period Weeks    Status On-going                 Plan - 10/26/19 2952    Clinical Impression Statement Patient is able to go up and down steps with a step over step using handrails now. He needs VC to increase left quad contraction when walking with a heel toe gait. Patient has improved coloring of his left knee and scar mobilty. Patient will benefit from skilled therapy to improve left knee ROM and strength.    Personal Factors and Comorbidities Time since onset of injury/illness/exacerbation    Examination-Activity Limitations Locomotion Level;Transfers;Squat;Stairs;Sit;Stand    Examination-Participation Restrictions Community Activity;Driving;Yard Work;Shop    Stability/Clinical Decision Making Stable/Uncomplicated    Rehab Potential Good    PT Frequency 3x / week    PT Duration 8 weeks    PT Treatment/Interventions ADLs/Self Care Home Management;Cryotherapy;Electrical Stimulation;Moist Heat;Neuromuscular re-education;Therapeutic exercise;Functional mobility training;Stair training;Gait training;Therapeutic activities;Patient/family education;Manual techniques;Dry needling;Passive range of motion;Scar mobilization;Taping;Joint Manipulations    PT Next Visit Plan sit to stand, squats, stairs; leg press, bike with increased resistance; work on hip extension    PT Home Exercise Plan Access Code: [email or web address]    Consulted and Agree with Plan of Care Patient            Patient will benefit from skilled therapeutic intervention in order to improve the following deficits and impairments:  Abnormal gait, Decreased range of motion, Difficulty walking, Decreased activity tolerance, Pain, Hypomobility, Impaired flexibility, Decreased strength, Postural dysfunction, Increased edema  Visit Diagnosis: Chronic pain of left knee  Muscle weakness (generalized)  Chronic pain of right knee  Edema, unspecified type     Problem List Patient Active Problem List   Diagnosis Date Noted   S/P left TKA 09/29/2019   Status post total left knee replacement 09/29/2019   Preoperative cardiovascular examination 08/18/2019   S/P right inguinal hernia repair 03/29/2018   New onset left bundle branch block (LBBB) 01/12/2018   Atherosclerotic vascular disease 01/12/2018   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 [place], [place], [address] Phone: [phone]  Fax:  [phone]  Name: OVAL CAVAZOS MRN: [id number] Date of Birth: Nov 23, 1945

## 2019-10-27 ENCOUNTER — Ambulatory Visit: Payer: Medicare Other | Admitting: Physical Therapy

## 2019-10-27 ENCOUNTER — Encounter: Payer: Self-pay | Admitting: Physical Therapy

## 2019-10-27 ENCOUNTER — Other Ambulatory Visit: Payer: Self-pay

## 2019-10-27 DIAGNOSIS — M25562 Pain in left knee: Secondary | ICD-10-CM | POA: Diagnosis not present

## 2019-10-27 DIAGNOSIS — R609 Edema, unspecified: Secondary | ICD-10-CM | POA: Diagnosis not present

## 2019-10-27 DIAGNOSIS — M6281 Muscle weakness (generalized): Secondary | ICD-10-CM

## 2019-10-27 DIAGNOSIS — G8929 Other chronic pain: Secondary | ICD-10-CM

## 2019-10-27 DIAGNOSIS — M25561 Pain in right knee: Secondary | ICD-10-CM

## 2019-10-27 NOTE — Therapy (Signed)
[facility] [address], [place], [place], [address] Phone: [phone]   Fax:  [phone]  Physical Therapy Treatment  Patient Details  Name: Louis [name] MRN: [id number] Date of Birth: 11-29-1945 Referring Provider (PT): Paralee Cancel, MD   Encounter Date: 10/27/2019   PT End of Session - 10/27/19 1223    Visit Number 8    Date for PT Re-Evaluation 12/05/19    Authorization Type Medicare    Progress Note Due on Visit 10    PT Start Time 7628    PT Stop Time 3151    PT Time Calculation (min) 50 min    Activity Tolerance Patient tolerated treatment well;No increased pain    Behavior During Therapy [facility] for tasks assessed/performed           Past Medical History:  Diagnosis Date   Arthritis    Ascending aorta dilatation (HCC)    4.1 cm noted on CT1/15/21   CKD (chronic kidney disease) stage 2, GFR 60-89 ml/min    see [place], Stage 1   colon ca dx'd 04/2016   colon   Hernia, abdominal    Hyperlipidemia    Hypertension    LBBB (left bundle branch block) 08/18/2019   Neuropathy    Pneumonia 05/19/2018   Seasonal allergies    Thrombocytopenia (HCC)    during chemo.    Past Surgical History:  Procedure Laterality Date   BIOPSY  04/10/2016   Procedure: BIOPSY Mesentary;  Surgeon: Judeth Horn, MD;  Location: [place];  Service: General;;   COLOSTOMY     COLOSTOMY REVERSAL N/A 01/12/2017   Procedure: COLOSTOMY REVERSAL;  Surgeon: Judeth Horn, MD;  Location: [place];  Service: General;  Laterality: N/A;   FLEXIBLE SIGMOIDOSCOPY N/A 04/09/2016   Procedure: FLEXIBLE SIGMOIDOSCOPY;  Surgeon: Ladene Artist, MD;  [facility]: [facility];  Service: Endoscopy;  Laterality: N/A;   HERNIA REPAIR     in childhood   INGUINAL HERNIA REPAIR Right 03/29/2018   Procedure: OPEN REPAIR OF RIGHT INGUINAL HERNIA WITH MESH;  Surgeon: Judeth Horn, MD;  Location: [place];  Service: General;  Laterality:  Right;   INSERTION OF MESH N/A 03/29/2018   Procedure: INSERTION OF MESH;  Surgeon: Judeth Horn, MD;  Location: [place];  Service: General;  Laterality: N/A;   IR GENERIC HISTORICAL  05/14/2016   IR US GUIDE VASC ACCESS RIGHT 05/14/2016 Corrie Mckusick, DO [facility]   IR GENERIC HISTORICAL  05/14/2016   IR FLUORO GUIDE PORT INSERTION RIGHT 05/14/2016 Corrie Mckusick, DO [facility]   IR REMOVAL TUN ACCESS W/ PORT W/O FL MOD SED  11/11/2016   PARTIAL COLECTOMY N/A 04/10/2016   Procedure: SIGMOID  COLECTOMY WITH COLOSTOMY;  Surgeon: Judeth Horn, MD;  Location: [place];  Service: General;  Laterality: N/A;   TONSILLECTOMY     TOTAL KNEE ARTHROPLASTY Left 09/29/2019   Procedure: TOTAL KNEE ARTHROPLASTY;  Surgeon: Paralee Cancel, MD;  [facility];  Service: Orthopedics;  Laterality: Left;  70 mins    There were no vitals filed for this visit.   Subjective Assessment - 10/27/19 1149    Subjective I was able to put my shoe and sock on today for the first time. I was a little sore from yesterday. I am not using the cane.    Limitations Walking;Standing;Sitting;House hold activities    How long can you sit comfortably? 1+ hours    How long can you stand comfortably? 5 min  How long can you walk comfortably? 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 Yes    Pain Score 2     Pain Location Knee    Pain Orientation Left    Pain Descriptors / Indicators Sore    Pain Type Surgical pain    Pain Onset More than a month ago    Pain Frequency Intermittent    Aggravating Factors  bending knee    Pain Relieving Factors ice    Multiple Pain Sites No              [facility] PT Assessment - 10/27/19 0001      AROM   Right Knee Extension -10      PROM   Left Knee Flexion 115                         [facility] Adult PT Treatment/Exercise - 10/27/19 0001      Knee/Hip Exercises: Stretches   Gastroc Stretch Both;3 reps;30  seconds    Gastroc Stretch Limitations while therapist is elongating the gastroc      Knee/Hip Exercises: Aerobic   Recumbent Bike level 4 for 6 minutes,  while assessing patient      Knee/Hip Exercises: Standing   Knee Flexion Strengthening;Left;2 sets;15 reps    Knee Flexion Limitations 5#    Other Standing Knee Exercises stand on left leg an dlean forward to work on left leg balance and hip strength      Knee/Hip Exercises: Supine   Short Arc Quad Sets Strengthening;Left;20 reps;2 sets;15 reps    Short Arc Quad Sets Limitations 2# with VC to go slowly    [employer] Strengthening;Both;1 set;20 reps    Bridges Limitations feet on ball with VC to place equal weight on both legs    Straight Leg Raises Strengthening;Left;1 set;15 reps    Straight Leg Raises Limitations 2#    Other Supine Knee/Hip Exercises feet on ball and roll back and forth to increase knee flexion      Modalities   Modalities Vasopneumatic      Vasopneumatic   Number Minutes Vasopneumatic  5 minutes    Vasopnuematic Location  Knee    Vasopneumatic Pressure Medium    Vasopneumatic Temperature  1 flake      Manual Therapy   Manual Therapy Soft tissue mobilization    Soft tissue mobilization scar massage                    PT Short Term Goals - 10/24/19 1231      PT SHORT TERM GOAL #1   Title ind with initial HEP    Time 3    Period Weeks    Status Achieved      PT SHORT TERM GOAL #2   Title Pt will be able to tolerate at least 10 min of standing/walking for household activity.    Baseline 5'    Time 4    Period Weeks    Status Achieved      PT SHORT TERM GOAL #3   Title Lt knee ROM at least -10 ext and 95 flexion    Time 4    Period Weeks    Status Achieved             PT Long Term Goals - 10/26/19 0929      PT LONG TERM GOAL #1  Title Pt will achieve Lt LE strength of at least 4+/5 for improved transfers, gait endurance and stairs.    Time 8    Period Weeks    Status On-going       PT LONG TERM GOAL #2   Title Improved Lt knee A/ROM to at least -5 ext and 115 flexion to normalize gait and functional squatting range.    Time 8    Period Weeks    Status On-going      PT LONG TERM GOAL #3   Title able to demo stairs with alternating pattern with single rail    Time 8    Period Weeks    Status On-going      PT LONG TERM GOAL #4   Title Pt will be able to tolerate standing household and community activities x at least 30 min with pain not to exceed 3/10 in Lt knee.    Time 8    Period Weeks    Status On-going                 Plan - 10/27/19 1224    Clinical Impression Statement Patient has increase left knee has increased ROM to -10 to 115 degrees. Patient has increased mobiity of scar. Patient is working on quad and hamstring strength. Patient continues to have tightness in the left calf. Patient walks with his left foot externally rotated. Patient was sore after last visit. Patient will benefit from skilled therapy to improve left knee ROM and strength.    Personal Factors and Comorbidities Time since onset of injury/illness/exacerbation    Examination-Activity Limitations Locomotion Level;Transfers;Squat;Stairs;Sit;Stand    Examination-Participation Restrictions Community Activity;Driving;Yard Work;Shop    Stability/Clinical Decision Making Stable/Uncomplicated    Rehab Potential Good    PT Frequency 3x / week    PT Duration 8 weeks    PT Treatment/Interventions ADLs/Self Care Home Management;Cryotherapy;Electrical Stimulation;Moist Heat;Neuromuscular re-education;Therapeutic exercise;Functional mobility training;Stair training;Gait training;Therapeutic activities;Patient/family education;Manual techniques;Dry needling;Passive range of motion;Scar mobilization;Taping;Joint Manipulations    PT Next Visit Plan sit to stand, squats, stairs; leg press, bike with increased resistance; work on hip extension    PT Home Exercise Plan Access Code: [email or web address]     Consulted and Agree with Plan of Care Patient           Patient will benefit from skilled therapeutic intervention in order to improve the following deficits and impairments:  Abnormal gait, Decreased range of motion, Difficulty walking, Decreased activity tolerance, Pain, Hypomobility, Impaired flexibility, Decreased strength, Postural dysfunction, Increased edema  Visit Diagnosis: Chronic pain of left knee  Muscle weakness (generalized)  Chronic pain of right knee  Edema, unspecified type     Problem List Patient Active Problem List   Diagnosis Date Noted   S/P left TKA 09/29/2019   Status post total left knee replacement 09/29/2019   Preoperative cardiovascular examination 08/18/2019   S/P right inguinal hernia repair 03/29/2018   New onset left bundle branch block (LBBB) 01/12/2018   Atherosclerotic vascular disease 01/12/2018   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   Port catheter in place 07/02/2016   Aorto-iliac atherosclerosis ([place]) 04/10/2016   Elevated fasting glucose 04/10/2016   Right inguinal hernia 04/10/2016   Adenocarcinoma of colon ([place]) 04/10/2016   Enterocolitis    Essential hypertension 04/04/2016   Acute kidney injury superimposed on chronic kidney disease Stage 2 ([place]) 04/04/2016   Ileus ([place]) 04/02/2016    Earlie Counts, PT 10/27/19 12:29 PM   [place] [facility] [address]  [place], [place], [place], [address] Phone: [phone]   Fax:  [phone]  Name: Louis [name] MRN: [id number] Date of Birth: [birth year]

## 2019-10-28 ENCOUNTER — Encounter: Payer: Medicare Other | Admitting: Physical Therapy

## 2019-10-31 ENCOUNTER — Encounter: Payer: Self-pay | Admitting: Physical Therapy

## 2019-10-31 ENCOUNTER — Ambulatory Visit: Payer: Medicare Other | Admitting: Physical Therapy

## 2019-10-31 ENCOUNTER — Other Ambulatory Visit: Payer: Self-pay

## 2019-10-31 DIAGNOSIS — M25561 Pain in right knee: Secondary | ICD-10-CM | POA: Diagnosis not present

## 2019-10-31 DIAGNOSIS — M6281 Muscle weakness (generalized): Secondary | ICD-10-CM | POA: Diagnosis not present

## 2019-10-31 DIAGNOSIS — G8929 Other chronic pain: Secondary | ICD-10-CM | POA: Diagnosis not present

## 2019-10-31 DIAGNOSIS — M25562 Pain in left knee: Secondary | ICD-10-CM

## 2019-10-31 DIAGNOSIS — R609 Edema, unspecified: Secondary | ICD-10-CM | POA: Diagnosis not present

## 2019-10-31 NOTE — Therapy (Signed)
[facility] [address], [place], [place], [address] Phone: [phone]   Fax:  [phone]  Physical Therapy Treatment  Patient Details  Name: Louis [name] MRN: [id number] Date of Birth: 02-01-1946 Referring Provider (PT): Paralee Cancel, MD   Encounter Date: 10/31/2019   PT End of Session - 10/31/19 1224    Visit Number 9    Date for PT Re-Evaluation 12/05/19    Authorization Type Medicare    PT Start Time 7741    PT Stop Time 2878    PT Time Calculation (min) 50 min    Activity Tolerance Patient tolerated treatment well;No increased pain    Behavior During Therapy WFL for tasks assessed/performed           Past Medical History:  Diagnosis Date  . Arthritis   . Ascending aorta dilatation (HCC)    4.1 cm noted on CT1/15/21  . CKD (chronic kidney disease) stage 2, GFR 60-89 ml/min    see [place], Stage 1  . colon ca dx'd 04/2016   colon  . Hernia, abdominal   . Hyperlipidemia   . Hypertension   . LBBB (left bundle branch block) 08/18/2019  . Neuropathy   . Pneumonia 05/19/2018  . Seasonal allergies   . Thrombocytopenia ([place])    during chemo.    Past Surgical History:  Procedure Laterality Date  . BIOPSY  04/10/2016   Procedure: BIOPSY Mesentary;  Surgeon: Judeth Horn, MD;  Location: [place];  Service: General;;  . COLOSTOMY    . COLOSTOMY REVERSAL N/A 01/12/2017   Procedure: COLOSTOMY REVERSAL;  Surgeon: Judeth Horn, MD;  Location: [place];  Service: General;  Laterality: N/A;  . FLEXIBLE SIGMOIDOSCOPY N/A 04/09/2016   Procedure: Beryle Quant;  Surgeon: Ladene Artist, MD;  Location: [facility] ENDOSCOPY;  Service: Endoscopy;  Laterality: N/A;  . HERNIA REPAIR     in childhood  . INGUINAL HERNIA REPAIR Right 03/29/2018   Procedure: OPEN REPAIR OF RIGHT INGUINAL HERNIA WITH MESH;  Surgeon: Judeth Horn, MD;  Location: [place];  Service: General;  Laterality: Right;  . INSERTION OF MESH N/A  03/29/2018   Procedure: INSERTION OF MESH;  Surgeon: Judeth Horn, MD;  Location: [place];  Service: General;  Laterality: N/A;  . IR GENERIC HISTORICAL  05/14/2016   IR US GUIDE VASC ACCESS RIGHT 05/14/2016 Corrie Mckusick, DO [facility]  . IR GENERIC HISTORICAL  05/14/2016   IR FLUORO GUIDE PORT INSERTION RIGHT 05/14/2016 Corrie Mckusick, DO [facility]  . IR REMOVAL TUN ACCESS W/ PORT W/O FL MOD SED  11/11/2016  . PARTIAL COLECTOMY N/A 04/10/2016   Procedure: SIGMOID  COLECTOMY WITH COLOSTOMY;  Surgeon: Judeth Horn, MD;  Location: [place];  Service: General;  Laterality: N/A;  . TONSILLECTOMY    . TOTAL KNEE ARTHROPLASTY Left 09/29/2019   Procedure: TOTAL KNEE ARTHROPLASTY;  Surgeon: Paralee Cancel, MD;  [facility]: [facility];  Service: Orthopedics;  Laterality: Left;  70 mins    There were no vitals filed for this visit.   Subjective Assessment - 10/31/19 1147    Subjective My knee is achy. It is getting easier to do steps but I take them slow.    Limitations Walking;Standing;Sitting;House hold activities    How long can you sit comfortably? 1+ hours    How long can you stand comfortably? 5 min    How long can you walk comfortably? 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 Yes    Pain Score 1     Pain Location Knee    Pain Orientation Left    Pain Descriptors / Indicators Sore    Pain Type Surgical pain    Pain Onset More than a month ago    Pain Frequency Intermittent    Aggravating Factors  bending knee    Pain Relieving Factors ice                             [facility] Adult PT Treatment/Exercise - 10/31/19 0001      Knee/Hip Exercises: Aerobic   Recumbent Bike level 4 for 6 minutes,  while assessing patient      Knee/Hip Exercises: Machines for Strengthening   Cybex Leg Press 2 legs 70# 2x15; left leg 40# 5x2 with therapist assisting with endrange extension      Knee/Hip Exercises: Standing   Forward Step Up  Left;1 set;20 reps;Hand Hold: 1    Forward Step Up Limitations working on control    Step Down Left;1 set;15 reps    Step Down Limitations focus on control and left quad contraction    Rocker Board 1 minute    Other Standing Knee Exercises standing on the left on the oval and move the right leg back and forth and side to side      Knee/Hip Exercises: Seated   Hamstring Curl Strengthening;Left;2 sets;10 reps    Hamstring Limitations blue band    Sit to Sand 10 reps;without UE support   cushion on chair and reach with the right to his left side     Knee/Hip Exercises: Supine   Short Arc Quad Sets Strengthening;Left;20 reps;2 sets;15 reps    Short Arc Quad Sets Limitations 7.5#    Bridges Strengthening;Both;1 set;20 reps    Bridges Limitations feet on ball with VC to place equal weight on both legs    Other Supine Knee/Hip Exercises feet on ball and roll back and forth to increase knee flexion      Modalities   Modalities Vasopneumatic      Vasopneumatic   Number Minutes Vasopneumatic  5 minutes    Vasopnuematic Location  Knee    Vasopneumatic Pressure Medium    Vasopneumatic Temperature  1 flake      Manual Therapy   Manual Therapy Soft tissue mobilization;Passive ROM    Soft tissue mobilization scar massage     Passive ROM to left knee for flexion and extension                    PT Short Term Goals - 10/24/19 1231      PT SHORT TERM GOAL #1   Title ind with initial HEP    Time 3    Period Weeks    Status Achieved      PT SHORT TERM GOAL #2   Title Pt will be able to tolerate at least 10 min of standing/walking for household activity.    Baseline 5'    Time 4    Period Weeks    Status Achieved      PT SHORT TERM GOAL #3   Title Lt knee ROM at least -10 ext and 95 flexion    Time 4    Period Weeks    Status Achieved             PT Long Term Goals - 10/26/19 0165  PT LONG TERM GOAL #1   Title Pt will achieve Lt LE strength of at least 4+/5  for improved transfers, gait endurance and stairs.    Time 8    Period Weeks    Status On-going      PT LONG TERM GOAL #2   Title Improved Lt knee A/ROM to at least -5 ext and 115 flexion to normalize gait and functional squatting range.    Time 8    Period Weeks    Status On-going      PT LONG TERM GOAL #3   Title able to demo stairs with alternating pattern with single rail    Time 8    Period Weeks    Status On-going      PT LONG TERM GOAL #4   Title Pt will be able to tolerate standing household and community activities x at least 30 min with pain not to exceed 3/10 in Lt knee.    Time 8    Period Weeks    Status On-going                 Plan - 10/31/19 1225    Clinical Impression Statement Patient has improved coloring of the left knee and improve scar mobility. Patient was able to perfrom a SAQ with increased weight. Patient still has difficulty with sit to stand from a low chair. Patient continues to keep his left foot ER. Patient will benefit from skilled therapy to improve left knee ROM and strength.    Personal Factors and Comorbidities Time since onset of injury/illness/exacerbation    Examination-Activity Limitations Locomotion Level;Transfers;Squat;Stairs;Sit;Stand    Examination-Participation Restrictions Community Activity;Driving;Yard Work;Shop    Stability/Clinical Decision Making Stable/Uncomplicated    Rehab Potential Good    PT Frequency 3x / week    PT Duration 8 weeks    PT Treatment/Interventions ADLs/Self Care Home Management;Cryotherapy;Electrical Stimulation;Moist Heat;Neuromuscular re-education;Therapeutic exercise;Functional mobility training;Stair training;Gait training;Therapeutic activities;Patient/family education;Manual techniques;Dry needling;Passive range of motion;Scar mobilization;Taping;Joint Manipulations    PT Next Visit Plan sit to stand, squats, stairs; leg press, bike with increased resistance; work on hip extension; write 10th note     PT Home Exercise Plan Access Code: [email or web address]    Consulted and Agree with Plan of Care Patient           Patient will benefit from skilled therapeutic intervention in order to improve the following deficits and impairments:  Abnormal gait, Decreased range of motion, Difficulty walking, Decreased activity tolerance, Pain, Hypomobility, Impaired flexibility, Decreased strength, Postural dysfunction, Increased edema  Visit Diagnosis: Chronic pain of left knee  Muscle weakness (generalized)  Chronic pain of right knee  Edema, unspecified type     Problem List Patient Active Problem List   Diagnosis Date Noted  . S/P left TKA 09/29/2019  . Status post total left knee replacement 09/29/2019  . Preoperative cardiovascular examination 08/18/2019  . S/P right inguinal hernia repair 03/29/2018  . New onset left bundle branch block (LBBB) 01/12/2018  . Atherosclerotic vascular disease 01/12/2018  . Colostomy in place [facility]) 01/12/2017  . Port catheter in place 07/02/2016  . 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 Elevated fasting glucose 04/10/2016  . Right inguinal hernia 04/10/2016  . Adenocarcinoma of colon ([place]) 04/10/2016  . Enterocolitis   . Essential hypertension 04/04/2016  . Acute kidney injury superimposed on chronic kidney disease Stage 2 ([place]) 04/04/2016  . Ileus ([place]) 04/02/2016    Earlie Counts, PT 10/31/19 12:28 PM   [facility] [address], [place], [place], [address] Phone: [phone]   Fax:  [phone]  Name: MEARL OLVER MRN: [id number] Date of Birth: 1945-12-12

## 2019-11-02 ENCOUNTER — Encounter: Payer: Self-pay | Admitting: Physical Therapy

## 2019-11-02 ENCOUNTER — Other Ambulatory Visit: Payer: Self-pay

## 2019-11-02 ENCOUNTER — Ambulatory Visit: Payer: Medicare Other | Admitting: Physical Therapy

## 2019-11-02 DIAGNOSIS — G8929 Other chronic pain: Secondary | ICD-10-CM | POA: Diagnosis not present

## 2019-11-02 DIAGNOSIS — M25561 Pain in right knee: Secondary | ICD-10-CM

## 2019-11-02 DIAGNOSIS — M6281 Muscle weakness (generalized): Secondary | ICD-10-CM | POA: Diagnosis not present

## 2019-11-02 DIAGNOSIS — R609 Edema, unspecified: Secondary | ICD-10-CM

## 2019-11-02 DIAGNOSIS — M25562 Pain in left knee: Secondary | ICD-10-CM | POA: Diagnosis not present

## 2019-11-02 NOTE — Therapy (Signed)
[facility] [address], [place], [place], [address] Phone: [phone]   Fax:  [phone]  Physical Therapy Treatment  Patient Details  Name: Louis [name] MRN: [id number] Date of Birth: December 14, 1945 Referring Provider (PT): Paralee Cancel, MD Progress Note Reporting Period 10/10/2019 to 11/02/2019  See note below for Objective Data and Assessment of Progress/Goals.       Encounter Date: 11/02/2019   PT End of Session - 11/02/19 1055    Visit Number 10    Date for PT Re-Evaluation 12/05/19    Authorization Type Medicare    Progress Note Due on Visit 20    PT Start Time 1015    PT Stop Time 1105    PT Time Calculation (min) 50 min    Activity Tolerance Patient tolerated treatment well;No increased pain    Behavior During Therapy WFL for tasks assessed/performed           Past Medical History:  Diagnosis Date  . Arthritis   . Ascending aorta dilatation (HCC)    4.1 cm noted on CT1/15/21  . CKD (chronic kidney disease) stage 2, GFR 60-89 ml/min    see [place], Stage 1  . colon ca dx'd 04/2016   colon  . Hernia, abdominal   . Hyperlipidemia   . Hypertension   . LBBB (left bundle branch block) 08/18/2019  . Neuropathy   . Pneumonia 05/19/2018  . Seasonal allergies   . Thrombocytopenia ([place])    during chemo.    Past Surgical History:  Procedure Laterality Date  . BIOPSY  04/10/2016   Procedure: BIOPSY Mesentary;  Surgeon: Judeth Horn, MD;  Location: [place];  Service: General;;  . COLOSTOMY    . COLOSTOMY REVERSAL N/A 01/12/2017   Procedure: COLOSTOMY REVERSAL;  Surgeon: Judeth Horn, MD;  Location: [place];  Service: General;  Laterality: N/A;  . FLEXIBLE SIGMOIDOSCOPY N/A 04/09/2016   Procedure: Beryle Quant;  Surgeon: Ladene Artist, MD;  Location: [facility] ENDOSCOPY;  Service: Endoscopy;  Laterality: N/A;  . HERNIA REPAIR     in childhood  . INGUINAL HERNIA REPAIR Right 03/29/2018    Procedure: OPEN REPAIR OF RIGHT INGUINAL HERNIA WITH MESH;  Surgeon: Judeth Horn, MD;  Location: [place];  Service: General;  Laterality: Right;  . INSERTION OF MESH N/A 03/29/2018   Procedure: INSERTION OF MESH;  Surgeon: Judeth Horn, MD;  Location: [place];  Service: General;  Laterality: N/A;  . IR GENERIC HISTORICAL  05/14/2016   IR US GUIDE VASC ACCESS RIGHT 05/14/2016 Corrie Mckusick, DO [facility]  . IR GENERIC HISTORICAL  05/14/2016   IR FLUORO GUIDE PORT INSERTION RIGHT 05/14/2016 Corrie Mckusick, DO [facility]  . IR REMOVAL TUN ACCESS W/ PORT W/O FL MOD SED  11/11/2016  . PARTIAL COLECTOMY N/A 04/10/2016   Procedure: SIGMOID  COLECTOMY WITH COLOSTOMY;  Surgeon: Judeth Horn, MD;  Location: [place];  Service: General;  Laterality: N/A;  . TONSILLECTOMY    . TOTAL KNEE ARTHROPLASTY Left 09/29/2019   Procedure: TOTAL KNEE ARTHROPLASTY;  Surgeon: Paralee Cancel, MD;  [facility];  Service: Orthopedics;  Laterality: Left;  70 mins    There were no vitals filed for this visit.   Subjective Assessment - 11/02/19 1027    Subjective I over did it last weekend so I have more pain and back to using the cane    Limitations Walking;Standing;Sitting;House hold activities    How long can you sit comfortably? 1+ hours  How long can you stand comfortably? 5 min    How long can you walk comfortably? 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 Yes    Pain Score 5     Pain Location Knee    Pain Orientation Left    Pain Descriptors / Indicators Sore    Pain Type Surgical pain    Pain Onset More than a month ago    Pain Frequency Intermittent    Aggravating Factors  bending knee    Pain Relieving Factors ice    Multiple Pain Sites No              [facility] PT Assessment - 11/02/19 0001      Assessment   Medical Diagnosis Z96.652 (ICD-10-CM) - Presence of left artificial knee joint    Referring Provider (PT) Paralee Cancel, MD    Onset  Date/Surgical Date 09/29/19    Hand Dominance Left    Next MD Visit 10/12/19    Prior Therapy yes, 2 visits with Dr. Aurea Graff office      Precautions   Precautions None      Restrictions   Weight Bearing Restrictions No      Prior Function   Level of Independence Independent with household mobility with device;Independent with community mobility with device      Cognition   Overall Cognitive Status Within Functional Limits for tasks assessed      Circumferential Edema   Circumferential - Left  45      ROM / Strength   AROM / PROM / Strength AROM;PROM;Strength      AROM   Right Knee Extension -10      PROM   Left Hip Flexion 110    Left Knee Extension -10    Left Knee Flexion 115      Strength   Left Knee Flexion 4+/5    Left Knee Extension 4/5                         [facility] Adult PT Treatment/Exercise - 11/02/19 0001      Knee/Hip Exercises: Aerobic   Nustep level 2 for 6 minutes while assess patient      Knee/Hip Exercises: Machines for Strengthening   Cybex Leg Press 2 legs 70# 2x15; left leg 30# 5x2 with therapist assisting with endrange extension      Knee/Hip Exercises: Standing   Rocker Board 1 minute    Rebounder weight shift all three ways 1 minute each      Knee/Hip Exercises: Seated   Long Arc Quad Strengthening;Left;2 sets;15 reps;Weights    Long Arc Quad Weight 3 lbs.      Knee/Hip Exercises: Supine   Bridges Strengthening;Both;1 set;20 reps    Bridges Limitations feet on ball with VC to place equal weight on both legs    Other Supine Knee/Hip Exercises feet on ball and roll back and forth to increase knee flexion      Modalities   Modalities Vasopneumatic      Vasopneumatic   Number Minutes Vasopneumatic  5 minutes    Vasopnuematic Location  Knee    Vasopneumatic Pressure Medium    Vasopneumatic Temperature  1 flake                    PT Short Term Goals - 11/02/19 1102      PT SHORT TERM GOAL #1  Title ind with  initial HEP    Time 3    Status Achieved      PT SHORT TERM GOAL #2   Title Pt will be able to tolerate at least 10 min of standing/walking for household activity.    Baseline 5'    Time 4    Period Weeks    Status Achieved      PT SHORT TERM GOAL #3   Title Lt knee ROM at least -10 ext and 95 flexion    Time 4    Period Weeks    Status Achieved      PT SHORT TERM GOAL #4   Title Pt will transition to a [facility] for household and short community outings.    Time 4    Period Weeks    Status Achieved             PT Long Term Goals - 11/02/19 1103      PT LONG TERM GOAL #1   Title Pt will achieve Lt LE strength of at least 4+/5 for improved transfers, gait endurance and stairs.    Period Weeks    Status On-going      PT LONG TERM GOAL #2   Title Improved Lt knee A/ROM to at least -5 ext and 115 flexion to normalize gait and functional squatting range.    Baseline -10-115    Time 8    Period Weeks    Status On-going      PT LONG TERM GOAL #3   Title able to demo stairs with alternating pattern with single rail    Baseline able to ascend/descend with forward facing motion.  20% less    Time 8    Period Weeks    Status On-going      PT LONG TERM GOAL #4   Title Pt will be able to tolerate standing household and community activities x at least 30 min with pain not to exceed 3/10 in Lt knee.    Time 8    Period Weeks    Status On-going                 Plan - 11/02/19 1055    Clinical Impression Statement Patient is having a day of increased pain today so exercises were not progressed. Right knee strength for flexion is 4+/5 and extension is 4/5. Right knee P/ROM is -10-115. Edema of left knee has decreased from 47 cm to 45 cm. Patient is walking with a cane today due to the increased pain but typically will ambulate without. When patient ambulates there is decreased heel strike on left and decreased left knee extension. Patient has improvement of left quad  contraction. Patient is able to go up and down stairs with alternate pattern but on the way down needs a handrail due to decreased eccentric control of left quad. Patient will benefit from skilled therapy to improve left knee ROM and strength.    Personal Factors and Comorbidities Time since onset of injury/illness/exacerbation    Examination-Activity Limitations Locomotion Level;Transfers;Squat;Stairs;Sit;Stand    Examination-Participation Restrictions Community Activity;Driving;Yard Work;Shop    PT Next Visit Plan sit to stand, squats, stairs; leg press, bike with increased resistance; work on hip extension; write 10th note    PT Home Exercise Plan Access Code: [email or web address]    Consulted and Agree with Plan of Care Patient           Patient will benefit from skilled therapeutic intervention in order to  improve the following deficits and impairments:     Visit Diagnosis: Chronic pain of left knee  Muscle weakness (generalized)  Chronic pain of right knee  Edema, unspecified type     Problem List Patient Active Problem List   Diagnosis Date Noted  . S/P left TKA 09/29/2019  . Status post total left knee replacement 09/29/2019  . Preoperative cardiovascular examination 08/18/2019  . S/P right inguinal hernia repair 03/29/2018  . New onset left bundle branch block (LBBB) 01/12/2018  . Atherosclerotic vascular disease 01/12/2018  . 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 Port catheter in place 07/02/2016  . Aorto-iliac atherosclerosis ([place]) 04/10/2016  . Elevated fasting glucose 04/10/2016  . Right inguinal hernia 04/10/2016  . Adenocarcinoma of colon ([place]) 04/10/2016  . Enterocolitis   . Essential hypertension 04/04/2016  . Acute kidney injury superimposed on chronic kidney disease Stage 2 ([place]) 04/04/2016  . Ileus ([place]) 04/02/2016    Earlie Counts, PT 11/02/19 11:05 AM   [facility] [address] [place], [place], [address] Phone: [phone]   Fax:  [phone]  Name: Louis [name] MRN: [id number] Date of Birth: 1945-10-24

## 2019-11-04 ENCOUNTER — Ambulatory Visit: Payer: Medicare Other | Attending: Orthopedic Surgery | Admitting: Physical Therapy

## 2019-11-04 ENCOUNTER — Other Ambulatory Visit: Payer: Self-pay

## 2019-11-04 ENCOUNTER — Encounter: Payer: Self-pay | Admitting: Physical Therapy

## 2019-11-04 DIAGNOSIS — M6281 Muscle weakness (generalized): Secondary | ICD-10-CM | POA: Insufficient documentation

## 2019-11-04 DIAGNOSIS — G8929 Other chronic pain: Secondary | ICD-10-CM | POA: Insufficient documentation

## 2019-11-04 DIAGNOSIS — M25561 Pain in right knee: Secondary | ICD-10-CM | POA: Diagnosis not present

## 2019-11-04 DIAGNOSIS — M25562 Pain in left knee: Secondary | ICD-10-CM | POA: Diagnosis not present

## 2019-11-04 DIAGNOSIS — R609 Edema, unspecified: Secondary | ICD-10-CM | POA: Diagnosis not present

## 2019-11-04 NOTE — Therapy (Signed)
[facility] [address], [place], [place], [address] Phone: [phone]   Fax:  [phone]  Physical Therapy Treatment  Patient Details  Name: Louis [name] MRN: [id number] Date of Birth: 01-30-1946 Referring Provider (Louis [name]): Paralee Cancel, MD   Encounter Date: 11/04/2019   Louis [name] End of Session - 11/04/19 1006    Visit Number 11    Date for Louis [name] Re-Evaluation 12/05/19    Authorization Type Medicare    Progress Note Due on Visit 20    Louis [name] Start Time 0930    Louis [name] Stop Time 1012    Louis [name] Time Calculation (min) 42 min    Activity Tolerance Patient tolerated treatment well;No increased pain    Behavior During Therapy WFL for tasks assessed/performed           Past Medical History:  Diagnosis Date  . Arthritis   . Ascending aorta dilatation (HCC)    4.1 cm noted on CT1/15/21  . CKD (chronic kidney disease) stage 2, GFR 60-89 ml/min    see [place], Stage 1  . colon ca dx'd 04/2016   colon  . Hernia, abdominal   . Hyperlipidemia   . Hypertension   . LBBB (left bundle branch block) 08/18/2019  . Neuropathy   . Pneumonia 05/19/2018  . Seasonal allergies   . 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    Past Surgical History:  Procedure Laterality Date  . BIOPSY  04/10/2016   Procedure: BIOPSY Mesentary;  Surgeon: Judeth Horn, MD;  Location: [place];  Service: General;;  . COLOSTOMY    . COLOSTOMY REVERSAL N/A 01/12/2017   Procedure: COLOSTOMY REVERSAL;  Surgeon: Judeth Horn, MD;  Location: [place];  Service: General;  Laterality: N/A;  . FLEXIBLE SIGMOIDOSCOPY N/A 04/09/2016   Procedure: Beryle Quant;  Surgeon: Ladene Artist, MD;  Location: [facility] ENDOSCOPY;  Service: Endoscopy;  Laterality: N/A;  . HERNIA REPAIR     in childhood  . INGUINAL HERNIA REPAIR Right 03/29/2018   Procedure: OPEN REPAIR OF RIGHT INGUINAL HERNIA WITH MESH;  Surgeon: Judeth Horn, MD;  Location: [place];  Service: General;  Laterality:  Right;  . INSERTION OF MESH N/A 03/29/2018   Procedure: INSERTION OF MESH;  Surgeon: Judeth Horn, MD;  Location: [place];  Service: General;  Laterality: N/A;  . IR GENERIC HISTORICAL  05/14/2016   IR US GUIDE VASC ACCESS RIGHT 05/14/2016 Corrie Mckusick, DO [facility]  . IR GENERIC HISTORICAL  05/14/2016   IR FLUORO GUIDE PORT INSERTION RIGHT 05/14/2016 Corrie Mckusick, DO [facility]  . IR REMOVAL TUN ACCESS W/ PORT W/O FL MOD SED  11/11/2016  . PARTIAL COLECTOMY N/A 04/10/2016   Procedure: SIGMOID  COLECTOMY WITH COLOSTOMY;  Surgeon: Judeth Horn, MD;  Location: [place];  Service: General;  Laterality: N/A;  . TONSILLECTOMY    . TOTAL KNEE ARTHROPLASTY Left 09/29/2019   Procedure: TOTAL KNEE ARTHROPLASTY;  Surgeon: Paralee Cancel, MD;  Location: [facility];  Service: Orthopedics;  Laterality: Left;  70 mins    There were no vitals filed for this visit.   Subjective Assessment - 11/04/19 0936    Subjective Not hurting as bad.    Limitations Walking;Standing;Sitting;House hold activities    How long can you sit comfortably? 1+ hours    How long can you stand comfortably? 5 min    How long can you walk comfortably? household distances    Patient Stated Goals driving, back to work as Hotel manager  Currently in Pain? Yes    Pain Score 0-No pain                             [facility] Adult Louis [name] Treatment/Exercise - 11/04/19 0001      Knee/Hip Exercises: Aerobic   Recumbent Bike level 4 for 6 minutes,  while assessing patient      Knee/Hip Exercises: Machines for Strengthening   Cybex Leg Press 2 legs 75# 1x15; left leg 30# 5x2 with therapist assisting with endrange extension      Knee/Hip Exercises: Standing   Forward Step Up Left;1 set;20 reps;Hand Hold: 1    Forward Step Up Limitations working on Environmental education officer 1 minute    Rebounder weight shift all three ways 1 minute each    Other Standing Knee Exercises standing on the left on the oval and move the right leg back and  forth and side to side      Knee/Hip Exercises: Seated   Long Arc Quad Strengthening;Left;2 sets;15 reps;Weights    Long Arc Quad Weight 5 lbs.    Hamstring Curl Strengthening;Left;2 sets;10 reps    Hamstring Limitations blue band    Sit to Sand 10 reps;without UE support   mat, 4inch step, pad     Manual Therapy   Manual Therapy Soft tissue mobilization;Passive ROM    Soft tissue mobilization scar massage     Passive ROM to left knee for flexion and extension                    Louis [name] Short Term Goals - 11/02/19 1102      Louis [name] SHORT TERM GOAL #1   Title ind with initial HEP    Time 3    Status Achieved      Louis [name] SHORT TERM GOAL #2   Title Louis [name] will be able to tolerate at least 10 min of standing/walking for household activity.    Baseline 5'    Time 4    Period Weeks    Status Achieved      Louis [name] SHORT TERM GOAL #3   Title Lt knee ROM at least -10 ext and 95 flexion    Time 4    Period Weeks    Status Achieved      Louis [name] SHORT TERM GOAL #4   Title Louis [name] will transition to a [facility] for household and short community outings.    Time 4    Period Weeks    Status Achieved             Louis [name] Long Term Goals - 11/02/19 1103      Louis [name] LONG TERM GOAL #1   Title Louis [name] will achieve Lt LE strength of at least 4+/5 for improved transfers, gait endurance and stairs.    Period Weeks    Status On-going      Louis [name] LONG TERM GOAL #2   Title Improved Lt knee A/ROM to at least -5 ext and 115 flexion to normalize gait and functional squatting range.    Baseline -10-115    Time 8    Period Weeks    Status On-going      Louis [name] LONG TERM GOAL #3   Title able to demo stairs with alternating pattern with single rail    Baseline able to ascend/descend with forward facing motion.  20% less    Time 8    Period Weeks  Status On-going      Louis [name] LONG TERM GOAL #4   Title Louis [name] will be able to tolerate standing household and community activities x at least 30 min with pain not to exceed 3/10 in Lt knee.     Time 8    Period Weeks    Status On-going                 Plan - 11/04/19 1006    Clinical Impression Statement Patient has less pai nin left knee today and tolerated the exercises better. Patient was able to bend his left knee with greater ease. Patient still needs verbal cues to walk with heel toe and has some troule with his balance. Patient will benefit from skilled therapy to improve left knee ROM and strength.    Personal Factors and Comorbidities Time since onset of injury/illness/exacerbation    Examination-Activity Limitations Locomotion Level;Transfers;Squat;Stairs;Sit;Stand    Examination-Participation Restrictions Community Activity;Driving;Yard Work;Shop    Stability/Clinical Decision Making Stable/Uncomplicated    Rehab Potential Good    Louis [name] Frequency 3x / week    Louis [name] Duration 8 weeks    Louis [name] Treatment/Interventions ADLs/Self Care Home Management;Cryotherapy;Electrical Stimulation;Moist Heat;Neuromuscular re-education;Therapeutic exercise;Functional mobility training;Stair training;Gait training;Therapeutic activities;Patient/family education;Manual techniques;Dry needling;Passive range of motion;Scar mobilization;Taping;Joint Manipulations    Louis [name] Next Visit Plan sees MD on7/8 so write progress note when seen on 7/8; continute with strengthening to work on quad strength and eccentric control, work on knee extension exercises    Louis [name] Home Exercise Plan Access Code: [email or web address]    Consulted and Agree with Plan of Care Patient           Patient will benefit from skilled therapeutic intervention in order to improve the following deficits and impairments:  Abnormal gait, Decreased range of motion, Difficulty walking, Decreased activity tolerance, Pain, Hypomobility, Impaired flexibility, Decreased strength, Postural dysfunction, Increased edema  Visit Diagnosis: Chronic pain of left knee  Muscle weakness (generalized)  Chronic pain of right knee  Edema, unspecified  type     Problem List Patient Active Problem List   Diagnosis Date Noted  . S/P left TKA 09/29/2019  . Status post total left knee replacement 09/29/2019  . Preoperative cardiovascular examination 08/18/2019  . S/P right inguinal hernia repair 03/29/2018  . New onset left bundle branch block (LBBB) 01/12/2018  . Atherosclerotic vascular disease 01/12/2018  . 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 Port catheter in place 07/02/2016  . 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 Elevated fasting glucose 04/10/2016  . Right inguinal hernia 04/10/2016  . Adenocarcinoma of colon ([place]) 04/10/2016  . Enterocolitis   . Essential hypertension 04/04/2016  . Acute kidney injury superimposed on chronic kidney disease Stage 2 ([place]) 04/04/2016  . Ileus ([place]) 04/02/2016    Louis [name], Louis [name] 11/04/19 10:17 AM   [facility] [address], [place], [place], [address] Phone: [phone]   Fax:  [phone]  Name: Louis [name] MRN: [id number] Date of Birth: [birth year]

## 2019-11-08 ENCOUNTER — Other Ambulatory Visit: Payer: Self-pay

## 2019-11-08 ENCOUNTER — Ambulatory Visit: Payer: Medicare Other | Admitting: Physical Therapy

## 2019-11-08 ENCOUNTER — Encounter: Payer: Self-pay | Admitting: Physical Therapy

## 2019-11-08 DIAGNOSIS — M6281 Muscle weakness (generalized): Secondary | ICD-10-CM

## 2019-11-08 DIAGNOSIS — M25561 Pain in right knee: Secondary | ICD-10-CM | POA: Diagnosis not present

## 2019-11-08 DIAGNOSIS — R609 Edema, unspecified: Secondary | ICD-10-CM | POA: Diagnosis not present

## 2019-11-08 DIAGNOSIS — G8929 Other chronic pain: Secondary | ICD-10-CM | POA: Diagnosis not present

## 2019-11-08 DIAGNOSIS — M25562 Pain in left knee: Secondary | ICD-10-CM | POA: Diagnosis not present

## 2019-11-08 NOTE — Therapy (Signed)
[facility] [address], [place], [place], [address] Phone: [phone]   Fax:  [phone]  Physical Therapy Treatment  Patient Details  Name: Louis [name] MRN: [id number] Date of Birth: 05-23-1945 Referring Provider (PT): Paralee Cancel, MD   Encounter Date: 11/08/2019   PT End of Session - 11/08/19 1659    Visit Number 12    Date for PT Re-Evaluation 12/05/19    Authorization Type Medicare    Progress Note Due on Visit 20    PT Start Time 0254    PT Stop Time 1657    PT Time Calculation (min) 42 min    Activity Tolerance Patient tolerated treatment well;No increased pain    Behavior During Therapy WFL for tasks assessed/performed           Past Medical History:  Diagnosis Date  . Arthritis   . Ascending aorta dilatation (HCC)    4.1 cm noted on CT1/15/21  . CKD (chronic kidney disease) stage 2, GFR 60-89 ml/min    see [place], Stage 1  . colon ca dx'd 04/2016   colon  . Hernia, abdominal   . Hyperlipidemia   . Hypertension   . LBBB (left bundle branch block) 08/18/2019  . Neuropathy   . Pneumonia 05/19/2018  . Seasonal allergies   . Thrombocytopenia ([place])    during chemo.    Past Surgical History:  Procedure Laterality Date  . BIOPSY  04/10/2016   Procedure: BIOPSY Mesentary;  Surgeon: Judeth Horn, MD;  Location: [place];  Service: General;;  . COLOSTOMY    . COLOSTOMY REVERSAL N/A 01/12/2017   Procedure: COLOSTOMY REVERSAL;  Surgeon: Judeth Horn, MD;  Location: [place];  Service: General;  Laterality: N/A;  . FLEXIBLE SIGMOIDOSCOPY N/A 04/09/2016   Procedure: Beryle Quant;  Surgeon: Ladene Artist, MD;  Location: [facility] ENDOSCOPY;  Service: Endoscopy;  Laterality: N/A;  . HERNIA REPAIR     in childhood  . INGUINAL HERNIA REPAIR Right 03/29/2018   Procedure: OPEN REPAIR OF RIGHT INGUINAL HERNIA WITH MESH;  Surgeon: Judeth Horn, MD;  Location: [place];  Service: General;  Laterality:  Right;  . INSERTION OF MESH N/A 03/29/2018   Procedure: INSERTION OF MESH;  Surgeon: Judeth Horn, MD;  Location: [place];  Service: General;  Laterality: N/A;  . IR GENERIC HISTORICAL  05/14/2016   IR US GUIDE VASC ACCESS RIGHT 05/14/2016 Corrie Mckusick, DO [facility]  . IR GENERIC HISTORICAL  05/14/2016   IR FLUORO GUIDE PORT INSERTION RIGHT 05/14/2016 Corrie Mckusick, DO [facility]  . IR REMOVAL TUN ACCESS W/ PORT W/O FL MOD SED  11/11/2016  . PARTIAL COLECTOMY N/A 04/10/2016   Procedure: SIGMOID  COLECTOMY WITH COLOSTOMY;  Surgeon: Judeth Horn, MD;  Location: [place];  Service: General;  Laterality: N/A;  . TONSILLECTOMY    . TOTAL KNEE ARTHROPLASTY Left 09/29/2019   Procedure: TOTAL KNEE ARTHROPLASTY;  Surgeon: Paralee Cancel, MD;  [facility];  Service: Orthopedics;  Laterality: Left;  70 mins    There were no vitals filed for this visit.       [facility] PT Assessment - 11/08/19 0001      AROM   Right Knee Extension 0    Right Knee Flexion 125    Left Knee Extension -10    Left Knee Flexion 119  [facility] Adult PT Treatment/Exercise - 11/08/19 0001      Exercises   Exercises Knee/Hip      Knee/Hip Exercises: Stretches   Active Hamstring Stretch Left;30 seconds    Active Hamstring Stretch Limitations with strap and seated with quad set, 1x30 each position, Lt    Other Knee/Hip Stretches knee flexion with strap overpressue bil UEs 10x3 sec      Knee/Hip Exercises: Aerobic   Recumbent Bike level 4 for 6 minutes,  while assessing patient      Knee/Hip Exercises: Machines for Strengthening   Cybex Leg Press 2 legs 75# 1x15; left leg 30# 5x2 with therapist assisting with endrange extension      Knee/Hip Exercises: Standing   Knee Flexion Limitations foot on 2nd step bil UE on rails A/ROM x 20 reps to stretch knee flexion    Forward Step Up Left;1 set;20 reps;Hand Hold: 1    Forward Step Up Limitations working on control      Knee/Hip  Exercises: Seated   Long Arc Quad Strengthening;Left;2 sets;15 reps    Long Arc Quad Weight 6 lbs.    Marching Limitations seated on 4" riser + black pad    Marching Weights 10 lbs.    Hamstring Curl Strengthening;Left;2 sets;15 reps    Hamstring Limitations blue band    Sit to Sand without UE support;15 reps   from 4" riser (no black pad today)     Manual Therapy   Manual Therapy Soft tissue mobilization    Soft tissue mobilization scar massage lower third of scar, Lt anterior knee/leg                    PT Short Term Goals - 11/02/19 1102      PT SHORT TERM GOAL #1   Title ind with initial HEP    Time 3    Status Achieved      PT SHORT TERM GOAL #2   Title Pt will be able to tolerate at least 10 min of standing/walking for household activity.    Baseline 5'    Time 4    Period Weeks    Status Achieved      PT SHORT TERM GOAL #3   Title Lt knee ROM at least -10 ext and 95 flexion    Time 4    Period Weeks    Status Achieved      PT SHORT TERM GOAL #4   Title Pt will transition to a [facility] for household and short community outings.    Time 4    Period Weeks    Status Achieved             PT Long Term Goals - 11/08/19 1705      PT LONG TERM GOAL #1   Title Pt will achieve Lt LE strength of at least 4+/5 for improved transfers, gait endurance and stairs.    Status On-going      PT LONG TERM GOAL #2   Title Improved Lt knee A/ROM to at least -5 ext and 115 flexion to normalize gait and functional squatting range.    Baseline -10 to 119 measured 7/6    Status On-going      PT LONG TERM GOAL #3   Title able to demo stairs with alternating pattern with single rail    Baseline able to ascend/descend with forward facing motion.  20% less    Status On-going      PT LONG  TERM GOAL #4   Title Pt will be able to tolerate standing household and community activities x at least 30 min with pain not to exceed 3/10 in Lt knee.    Status On-going                  Plan - 11/08/19 1659    Clinical Impression Statement Pt making progress with Lt knee ROM, with A/ROM measuring -10 to 119 (Rt knee ranges 0-125).  Pt reports some difficulty donning shoes/socks due to stiffness with knee flexion so PT focused on some additional ways to range/stretch knee flexion.  Pt was able to perform more reps with increased resistance for all ther ex today.  PT provided manual assistance for end range ext and gave VCs to slow eccentric phase of ther ex.  PT performed tapping on VMO for quad sets to improve prolonged contraction (with towel under heel for knee ext).  He is able to perform sit to stand from mat + 4" riser without UEs x 15 reps with good form and LE use vs momentum.  PT performed STM/scar massage over restricted lower aspect of Lt knee scar today.  Pt seeking more appointments after this week but clinic schedule is full so Pt on the waitlist.  Pt sees knee MD/surgeon the afternoon of his next visit.   Continue along POC.    PT Frequency 3x / week    PT Duration 8 weeks    PT Treatment/Interventions ADLs/Self Care Home Management;Cryotherapy;Electrical Stimulation;Moist Heat;Neuromuscular re-education;Therapeutic exercise;Functional mobility training;Stair training;Gait training;Therapeutic activities;Patient/family education;Manual techniques;Dry needling;Passive range of motion;Scar mobilization;Taping;Joint Manipulations    PT Next Visit Plan sees MD on7/8 so write progress note when seen on 7/8; continue with strengthening to work on quad strength and eccentric control, work on knee extension exercises    PT Home Exercise Plan Access Code: [email or web address]    Consulted and Agree with Plan of Care Patient           Patient will benefit from skilled therapeutic intervention in order to improve the following deficits and impairments:     Visit Diagnosis: Chronic pain of left knee  Muscle weakness (generalized)     Problem List Patient Active  Problem List   Diagnosis Date Noted  . S/P left TKA 09/29/2019  . Status post total left knee replacement 09/29/2019  . Preoperative cardiovascular examination 08/18/2019  . S/P right inguinal hernia repair 03/29/2018  . New onset left bundle branch block (LBBB) 01/12/2018  . Atherosclerotic vascular disease 01/12/2018  . 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 Port catheter in place 07/02/2016  . Aorto-iliac atherosclerosis ([place]) 04/10/2016  . Elevated fasting glucose 04/10/2016  . Right inguinal hernia 04/10/2016  . Adenocarcinoma of colon ([place]) 04/10/2016  . Enterocolitis   . Essential hypertension 04/04/2016  . Acute kidney injury superimposed on chronic kidney disease Stage 2 ([place]) 04/04/2016  . Ileus ([place]) 04/02/2016    Jordi Lacko E Marium Ragan 11/08/2019, 5:06 PM  [facility] [address] [place], [place], [address] Phone: [phone]   Fax:  [phone]  Name: Louis [name] MRN: [id number] Date of Birth: 04/29/1946

## 2019-11-10 ENCOUNTER — Other Ambulatory Visit: Payer: Self-pay

## 2019-11-10 ENCOUNTER — Encounter: Payer: Self-pay | Admitting: Physical Therapy

## 2019-11-10 ENCOUNTER — Ambulatory Visit: Payer: Medicare Other | Admitting: Physical Therapy

## 2019-11-10 DIAGNOSIS — G8929 Other chronic pain: Secondary | ICD-10-CM

## 2019-11-10 DIAGNOSIS — M25561 Pain in right knee: Secondary | ICD-10-CM | POA: Diagnosis not present

## 2019-11-10 DIAGNOSIS — Z96652 Presence of left artificial knee joint: Secondary | ICD-10-CM | POA: Diagnosis not present

## 2019-11-10 DIAGNOSIS — M6281 Muscle weakness (generalized): Secondary | ICD-10-CM

## 2019-11-10 DIAGNOSIS — M25562 Pain in left knee: Secondary | ICD-10-CM | POA: Diagnosis not present

## 2019-11-10 DIAGNOSIS — Z471 Aftercare following joint replacement surgery: Secondary | ICD-10-CM | POA: Diagnosis not present

## 2019-11-10 DIAGNOSIS — R609 Edema, unspecified: Secondary | ICD-10-CM

## 2019-11-10 NOTE — Therapy (Signed)
[facility] [address] [place], [place], [address] Phone: [phone]   Fax:  [phone]  Physical Therapy Treatment  Patient Details  Name: Louis [name] MRN: [id number] Date of Birth: [birth year] Referring Provider (PT): Paralee Cancel, MD   Encounter Date: 11/10/2019   PT End of Session - 11/10/19 1141    Visit Number 13    Date for PT Re-Evaluation 12/05/19    Authorization Type Medicare    Progress Note Due on Visit 20    PT Start Time 1100    PT Stop Time 1138    PT Time Calculation (min) 38 min    Activity Tolerance Patient tolerated treatment well;No increased pain    Behavior During Therapy WFL for tasks assessed/performed           Past Medical History:  Diagnosis Date  . Arthritis   . Ascending aorta dilatation (HCC)    4.1 cm noted on CT1/15/21  . CKD (chronic kidney disease) stage 2, GFR 60-89 ml/min    see [place], Stage 1  . colon ca dx'd 04/2016   colon  . Hernia, abdominal   . Hyperlipidemia   . Hypertension   . LBBB (left bundle branch block) 08/18/2019  . Neuropathy   . Pneumonia 05/19/2018  . Seasonal allergies   . 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    Past Surgical History:  Procedure Laterality Date  . BIOPSY  04/10/2016   Procedure: BIOPSY Mesentary;  Surgeon: Judeth Horn, MD;  Location: [place];  Service: General;;  . COLOSTOMY    . COLOSTOMY REVERSAL N/A 01/12/2017   Procedure: COLOSTOMY REVERSAL;  Surgeon: Judeth Horn, MD;  Location: [place];  Service: General;  Laterality: N/A;  . FLEXIBLE SIGMOIDOSCOPY N/A 04/09/2016   Procedure: Beryle Quant;  Surgeon: Ladene Artist, MD;  Location: [facility] ENDOSCOPY;  Service: Endoscopy;  Laterality: N/A;  . HERNIA REPAIR     in childhood  . INGUINAL HERNIA REPAIR Right 03/29/2018   Procedure: OPEN REPAIR OF RIGHT INGUINAL HERNIA WITH MESH;  Surgeon: Judeth Horn, MD;  Location: [place];  Service: General;  Laterality:  Right;  . INSERTION OF MESH N/A 03/29/2018   Procedure: INSERTION OF MESH;  Surgeon: Judeth Horn, MD;  Location: [place];  Service: General;  Laterality: N/A;  . IR GENERIC HISTORICAL  05/14/2016   IR US GUIDE VASC ACCESS RIGHT 05/14/2016 Corrie Mckusick, DO [facility]  . IR GENERIC HISTORICAL  05/14/2016   IR FLUORO GUIDE PORT INSERTION RIGHT 05/14/2016 Corrie Mckusick, DO [facility]  . IR REMOVAL TUN ACCESS W/ PORT W/O FL MOD SED  11/11/2016  . PARTIAL COLECTOMY N/A 04/10/2016   Procedure: SIGMOID  COLECTOMY WITH COLOSTOMY;  Surgeon: Judeth Horn, MD;  Location: [place];  Service: General;  Laterality: N/A;  . TONSILLECTOMY    . TOTAL KNEE ARTHROPLASTY Left 09/29/2019   Procedure: TOTAL KNEE ARTHROPLASTY;  Surgeon: Paralee Cancel, MD;  [facility]: [facility];  Service: Orthopedics;  Laterality: Left;  70 mins    There were no vitals filed for this visit.   Subjective Assessment - 11/10/19 1106    Subjective Easier to get socks on. I see the doctor today.    Limitations Walking;Standing;Sitting;House hold activities    How long can you sit comfortably? 1+ hours    How long can you stand comfortably? 5 min    How long can you walk comfortably? 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 No/denies              [facility] PT Assessment - 11/10/19 0001      Assessment   Medical Diagnosis Z96.652 (ICD-10-CM) - Presence of left artificial knee joint    Referring Provider (PT) Paralee Cancel, MD    Onset Date/Surgical Date 09/29/19    Hand Dominance Left    Next MD Visit 10/12/19    Prior Therapy yes, 2 visits with Dr. Aurea Graff office      Precautions   Precautions None      Restrictions   Weight Bearing Restrictions No      Cognition   Overall Cognitive Status Within Functional Limits for tasks assessed      ROM / Strength   AROM / PROM / Strength AROM;PROM;Strength      AROM   Left Knee Extension -10    Left Knee Flexion 115      PROM     Left Knee Extension -10    Left Knee Flexion 120      Strength   Left Knee Flexion 5/5    Left Knee Extension 4/5                         [facility] Adult PT Treatment/Exercise - 11/10/19 0001      Knee/Hip Exercises: Aerobic   Recumbent Bike level 4 for 6 minutes,  while assessing patient      Knee/Hip Exercises: Machines for Strengthening   Cybex Leg Press 2 legs 75# 1x15; left leg 30# 5x2 with therapist assisting with endrange extension      Knee/Hip Exercises: Standing   Stairs 5 times with holding onto one railing    Rocker Board 1 minute   only finger tips   Walking with Sports Cord 20# forward, backward, sideways 5 times each    Other Standing Knee Exercises sit to stand at different seat heights to work the quadriceps      Knee/Hip Exercises: Seated   Long Arc [employer] Strengthening;Left;2 sets;15 reps    Long Arc Quad Weight 6 lbs.                    PT Short Term Goals - 11/02/19 1102      PT SHORT TERM GOAL #1   Title ind with initial HEP    Time 3    Status Achieved      PT SHORT TERM GOAL #2   Title Pt will be able to tolerate at least 10 min of standing/walking for household activity.    Baseline 5'    Time 4    Period Weeks    Status Achieved      PT SHORT TERM GOAL #3   Title Lt knee ROM at least -10 ext and 95 flexion    Time 4    Period Weeks    Status Achieved      PT SHORT TERM GOAL #4   Title Pt will transition to a [facility] for household and short community outings.    Time 4    Period Weeks    Status Achieved             PT Long Term Goals - 11/10/19 1108      PT LONG TERM GOAL #1   Title Pt will achieve Lt LE strength of at least 4+/5 for improved transfers, gait endurance and stairs.  Time 8    Period Weeks    Status On-going      PT LONG TERM GOAL #2   Title Improved Lt knee A/ROM to at least -5 ext and 115 flexion to normalize gait and functional squatting range.    Baseline -10 to 119 measured 7/6     Time 8    Period Weeks    Status On-going      PT LONG TERM GOAL #3   Title able to demo stairs with alternating pattern with single rail    Time 8    Period Weeks    Status Achieved      PT LONG TERM GOAL #4   Title Pt will be able to tolerate standing household and community activities x at least 30 min with pain not to exceed 3/10 in Lt knee.    Time 8    Period Weeks    Status Achieved      PT LONG TERM GOAL #5   Title reduce FOTO to </= 51% limitation for improved function    Baseline 78%    Time 8    Period Weeks    Status On-going                 Plan - 11/10/19 1142    Clinical Impression Statement Patient A/ROM of left knee is -10-115 and P/ROM is -10-120. Left knee strength for flexion is 5/5 and extension 4/5. Patient is going up and down stairs with alternate pattern and one railing. Patient is able to walk without a cane now but has small steps and decreased heel strike. Patient is able to stand for 30 miutes without difficulty. Patient has an improved left knee quad contraction. He has improved mobility of healed incision. Patient will benefit from skilled therapy to work on knee extenion ROM and strength.    Personal Factors and Comorbidities Time since onset of injury/illness/exacerbation    Examination-Activity Limitations Locomotion Level;Transfers;Squat;Stairs;Sit;Stand    Examination-Participation Restrictions Community Activity;Driving;Yard Work;Shop    Stability/Clinical Decision Making Stable/Uncomplicated    Rehab Potential Good    PT Frequency 3x / week    PT Duration 8 weeks    PT Treatment/Interventions ADLs/Self Care Home Management;Cryotherapy;Electrical Stimulation;Moist Heat;Neuromuscular re-education;Therapeutic exercise;Functional mobility training;Stair training;Gait training;Therapeutic activities;Patient/family education;Manual techniques;Dry needling;Passive range of motion;Scar mobilization;Taping;Joint Manipulations    PT Next Visit  Plan see what MD says, continue to work on left knee extension strength and ROM. work on heel strike with walking    PT Home Exercise Plan Access Code: [email or web address]    Consulted and Agree with Plan of Care Patient           Patient will benefit from skilled therapeutic intervention in order to improve the following deficits and impairments:  Abnormal gait, Decreased range of motion, Difficulty walking, Decreased activity tolerance, Pain, Hypomobility, Impaired flexibility, Decreased strength, Postural dysfunction, Increased edema  Visit Diagnosis: Chronic pain of left knee  Muscle weakness (generalized)  Chronic pain of right knee  Edema, unspecified type     Problem List Patient Active Problem List   Diagnosis Date Noted  . S/P left TKA 09/29/2019  . Status post total left knee replacement 09/29/2019  . Preoperative cardiovascular examination 08/18/2019  . S/P right inguinal hernia repair 03/29/2018  . New onset left bundle branch block (LBBB) 01/12/2018  . Atherosclerotic vascular disease 01/12/2018  . 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 Port catheter in place 07/02/2016  . 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  Elevated fasting glucose 04/10/2016  . Right inguinal hernia 04/10/2016  . Adenocarcinoma of colon ([place]) 04/10/2016  . Enterocolitis   . Essential hypertension 04/04/2016  . Acute kidney injury superimposed on chronic kidney disease Stage 2 ([place]) 04/04/2016  . Ileus ([place]) 04/02/2016    Earlie Counts, PT 11/10/19 11:46 AM   [facility] [address] [place], [place], [address] Phone: [phone]   Fax:  [phone]  Name: GEO SLONE MRN: [id number] Date of Birth: 1945/08/19

## 2019-11-14 ENCOUNTER — Encounter: Payer: Self-pay | Admitting: Physical Therapy

## 2019-11-14 ENCOUNTER — Ambulatory Visit: Payer: Medicare Other | Admitting: Physical Therapy

## 2019-11-14 ENCOUNTER — Other Ambulatory Visit: Payer: Self-pay

## 2019-11-14 DIAGNOSIS — M25562 Pain in left knee: Secondary | ICD-10-CM

## 2019-11-14 DIAGNOSIS — R609 Edema, unspecified: Secondary | ICD-10-CM

## 2019-11-14 DIAGNOSIS — M6281 Muscle weakness (generalized): Secondary | ICD-10-CM | POA: Diagnosis not present

## 2019-11-14 DIAGNOSIS — G8929 Other chronic pain: Secondary | ICD-10-CM | POA: Diagnosis not present

## 2019-11-14 DIAGNOSIS — M25561 Pain in right knee: Secondary | ICD-10-CM

## 2019-11-14 NOTE — Therapy (Signed)
[facility] [address], [place], [place], [address] Phone: [phone]   Fax:  [phone]  Physical Therapy Treatment  Patient Details  Name: Louis [name] MRN: [id number] Date of Birth: 29-Jul-1945 Referring Provider (PT): Paralee Cancel, MD   Encounter Date: 11/14/2019   PT End of Session - 11/14/19 1611    Visit Number 14    Date for PT Re-Evaluation 12/05/19    Authorization Type Medicare    PT Start Time 2536    PT Stop Time 1610    PT Time Calculation (min) 40 min    Activity Tolerance Patient tolerated treatment well;No increased pain    Behavior During Therapy WFL for tasks assessed/performed           Past Medical History:  Diagnosis Date  . Arthritis   . Ascending aorta dilatation (HCC)    4.1 cm noted on CT1/15/21  . CKD (chronic kidney disease) stage 2, GFR 60-89 ml/min    see [place], Stage 1  . colon ca dx'd 04/2016   colon  . Hernia, abdominal   . Hyperlipidemia   . Hypertension   . LBBB (left bundle branch block) 08/18/2019  . Neuropathy   . Pneumonia 05/19/2018  . Seasonal allergies   . 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    Past Surgical History:  Procedure Laterality Date  . BIOPSY  04/10/2016   Procedure: BIOPSY Mesentary;  Surgeon: Judeth Horn, MD;  Location: [place];  Service: General;;  . COLOSTOMY    . COLOSTOMY REVERSAL N/A 01/12/2017   Procedure: COLOSTOMY REVERSAL;  Surgeon: Judeth Horn, MD;  Location: [place];  Service: General;  Laterality: N/A;  . FLEXIBLE SIGMOIDOSCOPY N/A 04/09/2016   Procedure: Beryle Quant;  Surgeon: Ladene Artist, MD;  Location: [facility] ENDOSCOPY;  Service: Endoscopy;  Laterality: N/A;  . HERNIA REPAIR     in childhood  . INGUINAL HERNIA REPAIR Right 03/29/2018   Procedure: OPEN REPAIR OF RIGHT INGUINAL HERNIA WITH MESH;  Surgeon: Judeth Horn, MD;  Location: [place];  Service: General;  Laterality: Right;  . INSERTION OF MESH N/A  03/29/2018   Procedure: INSERTION OF MESH;  Surgeon: Judeth Horn, MD;  Location: [place];  Service: General;  Laterality: N/A;  . IR GENERIC HISTORICAL  05/14/2016   IR US GUIDE VASC ACCESS RIGHT 05/14/2016 Corrie Mckusick, DO [facility]  . IR GENERIC HISTORICAL  05/14/2016   IR FLUORO GUIDE PORT INSERTION RIGHT 05/14/2016 Corrie Mckusick, DO [facility]  . IR REMOVAL TUN ACCESS W/ PORT W/O FL MOD SED  11/11/2016  . PARTIAL COLECTOMY N/A 04/10/2016   Procedure: SIGMOID  COLECTOMY WITH COLOSTOMY;  Surgeon: Judeth Horn, MD;  Location: [place];  Service: General;  Laterality: N/A;  . TONSILLECTOMY    . TOTAL KNEE ARTHROPLASTY Left 09/29/2019   Procedure: TOTAL KNEE ARTHROPLASTY;  Surgeon: Paralee Cancel, MD;  [facility];  Service: Orthopedics;  Laterality: Left;  70 mins    There were no vitals filed for this visit.   Subjective Assessment - 11/14/19 1534    Subjective MD is very pleased with my recovery. He likes my ROM. See him in 6 weeks. Would like me to have therapy to get the strength back. I am approved till the end.    Limitations Walking;Standing;Sitting;House hold activities    How long can you sit comfortably? 1+ hours    How long can you stand comfortably? 5 min    How long  can you walk comfortably? 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 No/denies                             [facility] Adult PT Treatment/Exercise - 11/14/19 0001      Knee/Hip Exercises: Aerobic   Recumbent Bike level 4 for 6 minutes,  while assessing patient   next visit increase to level 5     Knee/Hip Exercises: Machines for Strengthening   Cybex Leg Press 2 legs 75# 1x20, 10x 80#; left leg 30# 1x10, 35# 10xwith therapist assisting with endrange extension      Knee/Hip Exercises: Standing   Walking with Sports Cord 25# forward, backward, sideways 5 times each      Knee/Hip Exercises: Seated   Long Arc Quad Strengthening;Left;2  sets;15 reps    Long Arc Quad Weight 8 lbs.   VC to go slowly   Sit to Sand 3 sets;without UE support   leaning to left, each set mat is lowered     Manual Therapy   Manual Therapy Soft tissue mobilization    Soft tissue mobilization scar massage lower third of scar, Lt anterior knee/leg                    PT Short Term Goals - 11/02/19 1102      PT SHORT TERM GOAL #1   Title ind with initial HEP    Time 3    Status Achieved      PT SHORT TERM GOAL #2   Title Pt will be able to tolerate at least 10 min of standing/walking for household activity.    Baseline 5'    Time 4    Period Weeks    Status Achieved      PT SHORT TERM GOAL #3   Title Lt knee ROM at least -10 ext and 95 flexion    Time 4    Period Weeks    Status Achieved      PT SHORT TERM GOAL #4   Title Pt will transition to a [facility] for household and short community outings.    Time 4    Period Weeks    Status Achieved             PT Long Term Goals - 11/10/19 1108      PT LONG TERM GOAL #1   Title Pt will achieve Lt LE strength of at least 4+/5 for improved transfers, gait endurance and stairs.    Time 8    Period Weeks    Status On-going      PT LONG TERM GOAL #2   Title Improved Lt knee A/ROM to at least -5 ext and 115 flexion to normalize gait and functional squatting range.    Baseline -10 to 119 measured 7/6    Time 8    Period Weeks    Status On-going      PT LONG TERM GOAL #3   Title able to demo stairs with alternating pattern with single rail    Time 8    Period Weeks    Status Achieved      PT LONG TERM GOAL #4   Title Pt will be able to tolerate standing household and community activities x at least 30 min with pain not to exceed 3/10 in Lt knee.    Time 8  Period Weeks    Status Achieved      PT LONG TERM GOAL #5   Title reduce FOTO to </= 51% limitation for improved function    Baseline 78%    Time 8    Period Weeks    Status On-going                  Plan - 11/14/19 1611    Clinical Impression Statement Patient scar is still restricted. He has increased weights with his exercises today to increase his strength. We worked on increasing his step length and heel strike with using a ladder. Patient did not have pain with his therapy today. Patient is not using his cane unless he has to walk long distances. Patient will benefit from skilled therapy to work on knee extemsopm amd strength.    Personal Factors and Comorbidities Time since onset of injury/illness/exacerbation    Examination-Activity Limitations Locomotion Level;Transfers;Squat;Stairs;Sit;Stand    Examination-Participation Restrictions Community Activity;Driving;Yard Work;Shop    Stability/Clinical Decision Making Stable/Uncomplicated    Rehab Potential Good    PT Frequency 3x / week    PT Duration 8 weeks    PT Treatment/Interventions ADLs/Self Care Home Management;Cryotherapy;Electrical Stimulation;Moist Heat;Neuromuscular re-education;Therapeutic exercise;Functional mobility training;Stair training;Gait training;Therapeutic activities;Patient/family education;Manual techniques;Dry needling;Passive range of motion;Scar mobilization;Taping;Joint Manipulations    PT Next Visit Plan work on scar, work on increase step length and heel strike, exercises to increase quad strength    PT Home Exercise Plan Access Code: [email or web address]    Consulted and Agree with Plan of Care Patient           Patient will benefit from skilled therapeutic intervention in order to improve the following deficits and impairments:  Abnormal gait, Decreased range of motion, Difficulty walking, Decreased activity tolerance, Pain, Hypomobility, Impaired flexibility, Decreased strength, Postural dysfunction, Increased edema  Visit Diagnosis: Chronic pain of left knee  Muscle weakness (generalized)  Chronic pain of right knee  Edema, unspecified type     Problem List Patient Active Problem List   Diagnosis Date  Noted  . S/P left TKA 09/29/2019  . Status post total left knee replacement 09/29/2019  . Preoperative cardiovascular examination 08/18/2019  . S/P right inguinal hernia repair 03/29/2018  . New onset left bundle branch block (LBBB) 01/12/2018  . Atherosclerotic vascular disease 01/12/2018  . 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 Port catheter in place 07/02/2016  . Aorto-iliac atherosclerosis ([place]) 04/10/2016  . Elevated fasting glucose 04/10/2016  . Right inguinal hernia 04/10/2016  . Adenocarcinoma of colon ([place]) 04/10/2016  . Enterocolitis   . Essential hypertension 04/04/2016  . Acute kidney injury superimposed on chronic kidney disease Stage 2 ([place]) 04/04/2016  . Ileus ([place]) 04/02/2016    Louis [name], PT 11/14/19 4:16 PM   [place] [facility] [address] [place], [place], [address] Phone: [phone]   Fax:  [phone]  Name: Louis [name] MRN: [id number] Date of Birth: 09-29-1945

## 2019-11-16 ENCOUNTER — Ambulatory Visit: Payer: Medicare Other | Admitting: Physical Therapy

## 2019-11-16 ENCOUNTER — Other Ambulatory Visit: Payer: Self-pay

## 2019-11-16 ENCOUNTER — Encounter: Payer: Self-pay | Admitting: Physical Therapy

## 2019-11-16 DIAGNOSIS — R609 Edema, unspecified: Secondary | ICD-10-CM | POA: Diagnosis not present

## 2019-11-16 DIAGNOSIS — G8929 Other chronic pain: Secondary | ICD-10-CM | POA: Diagnosis not present

## 2019-11-16 DIAGNOSIS — M25561 Pain in right knee: Secondary | ICD-10-CM | POA: Diagnosis not present

## 2019-11-16 DIAGNOSIS — M6281 Muscle weakness (generalized): Secondary | ICD-10-CM | POA: Diagnosis not present

## 2019-11-16 DIAGNOSIS — M25562 Pain in left knee: Secondary | ICD-10-CM | POA: Diagnosis not present

## 2019-11-16 NOTE — Therapy (Signed)
[facility] [address], [place], [place], [address] Phone: [phone]   Fax:  [phone]  Physical Therapy Treatment  Patient Details  Name: Louis [name] MRN: [id number] Date of Birth: [birth year] Referring Provider (PT): Paralee Cancel, MD   Encounter Date: 11/16/2019   PT End of Session - 11/16/19 1233    Visit Number 15    Date for PT Re-Evaluation 12/05/19    Authorization Type Medicare, KX    Progress Note Due on Visit 20    PT Start Time 0175    PT Stop Time 1225    PT Time Calculation (min) 40 min    Activity Tolerance Patient tolerated treatment well;No increased pain    Behavior During Therapy WFL for tasks assessed/performed           Past Medical History:  Diagnosis Date  . Arthritis   . Ascending aorta dilatation (HCC)    4.1 cm noted on CT1/15/21  . CKD (chronic kidney disease) stage 2, GFR 60-89 ml/min    see [place], Stage 1  . colon ca dx'd 04/2016   colon  . Hernia, abdominal   . Hyperlipidemia   . Hypertension   . LBBB (left bundle branch block) 08/18/2019  . Neuropathy   . Pneumonia 05/19/2018  . Seasonal allergies   . 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    Past Surgical History:  Procedure Laterality Date  . BIOPSY  04/10/2016   Procedure: BIOPSY Mesentary;  Surgeon: Judeth Horn, MD;  Location: [place];  Service: General;;  . COLOSTOMY    . COLOSTOMY REVERSAL N/A 01/12/2017   Procedure: COLOSTOMY REVERSAL;  Surgeon: Judeth Horn, MD;  Location: [place];  Service: General;  Laterality: N/A;  . FLEXIBLE SIGMOIDOSCOPY N/A 04/09/2016   Procedure: Beryle Quant;  Surgeon: Ladene Artist, MD;  Location: [facility] ENDOSCOPY;  Service: Endoscopy;  Laterality: N/A;  . HERNIA REPAIR     in childhood  . INGUINAL HERNIA REPAIR Right 03/29/2018   Procedure: OPEN REPAIR OF RIGHT INGUINAL HERNIA WITH MESH;  Surgeon: Judeth Horn, MD;  Location: [place];  Service: General;  Laterality:  Right;  . INSERTION OF MESH N/A 03/29/2018   Procedure: INSERTION OF MESH;  Surgeon: Judeth Horn, MD;  Location: [place];  Service: General;  Laterality: N/A;  . IR GENERIC HISTORICAL  05/14/2016   IR US GUIDE VASC ACCESS RIGHT 05/14/2016 Corrie Mckusick, DO [facility]  . IR GENERIC HISTORICAL  05/14/2016   IR FLUORO GUIDE PORT INSERTION RIGHT 05/14/2016 Corrie Mckusick, DO [facility]  . IR REMOVAL TUN ACCESS W/ PORT W/O FL MOD SED  11/11/2016  . PARTIAL COLECTOMY N/A 04/10/2016   Procedure: SIGMOID  COLECTOMY WITH COLOSTOMY;  Surgeon: Judeth Horn, MD;  Location: [place];  Service: General;  Laterality: N/A;  . TONSILLECTOMY    . TOTAL KNEE ARTHROPLASTY Left 09/29/2019   Procedure: TOTAL KNEE ARTHROPLASTY;  Surgeon: Paralee Cancel, MD;  [facility];  Service: Orthopedics;  Laterality: Left;  70 mins    There were no vitals filed for this visit.   Subjective Assessment - 11/16/19 1148    Subjective No pain.  Doing great.    Limitations Walking;Standing;Sitting;House hold activities    How long can you sit comfortably? 1+ hours    How long can you stand comfortably? 15 min    How long can you walk comfortably? 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 No/denies              [facility] PT Assessment - 11/16/19 0001      ROM / Strength   AROM / PROM / Strength AROM      AROM   AROM Assessment Site Knee    Right/Left Knee Left    Left Knee Extension -8    Left Knee Flexion 120                         [facility] Adult PT Treatment/Exercise - 11/16/19 0001      Exercises   Exercises Knee/Hip      Knee/Hip Exercises: Stretches   Active Hamstring Stretch Left;2 reps;30 seconds    Active Hamstring Stretch Limitations foot on 2nd step, PT had Pt apply overpressure on distal femur for self-mob into ext    Gastroc Stretch Left;2 reps;20 seconds    Gastroc Stretch Limitations slant  board      Knee/Hip Exercises: Aerobic   Recumbent Bike L5 x 3', then L4 x 3' due to some pain      Knee/Hip Exercises: Machines for Strengthening   Cybex Leg Press 2 legs 75lb x 15 reps, 80lb x 15 reps, Lt LE only 35lb 2x10      Knee/Hip Exercises: Seated   Long Arc Quad Strengthening;Left;2 sets;10 reps;Weights    Long Arc Quad Weight 8 lbs.    Sit to Sand 10 reps;without UE support;2 sets   holding 10lb, mat table lowered 2nd set     Manual Therapy   Manual Therapy Soft tissue mobilization    Soft tissue mobilization scar massage lower third of scar, Lt anterior knee/leg                    PT Short Term Goals - 11/02/19 1102      PT SHORT TERM GOAL #1   Title ind with initial HEP    Time 3    Status Achieved      PT SHORT TERM GOAL #2   Title Pt will be able to tolerate at least 10 min of standing/walking for household activity.    Baseline 5'    Time 4    Period Weeks    Status Achieved      PT SHORT TERM GOAL #3   Title Lt knee ROM at least -10 ext and 95 flexion    Time 4    Period Weeks    Status Achieved      PT SHORT TERM GOAL #4   Title Pt will transition to a [facility] for household and short community outings.    Time 4    Period Weeks    Status Achieved             PT Long Term Goals - 11/10/19 1108      PT LONG TERM GOAL #1   Title Pt will achieve Lt LE strength of at least 4+/5 for improved transfers, gait endurance and stairs.    Time 8    Period Weeks    Status On-going      PT LONG TERM GOAL #2   Title Improved Lt knee A/ROM to at least -5 ext and 115 flexion to normalize gait and functional squatting range.    Baseline -10 to 119 measured 7/6    Time 8    Period Weeks    Status  On-going      PT LONG TERM GOAL #3   Title able to demo stairs with alternating pattern with single rail    Time 8    Period Weeks    Status Achieved      PT LONG TERM GOAL #4   Title Pt will be able to tolerate standing household and community  activities x at least 30 min with pain not to exceed 3/10 in Lt knee.    Time 8    Period Weeks    Status Achieved      PT LONG TERM GOAL #5   Title reduce FOTO to </= 51% limitation for improved function    Baseline 78%    Time 8    Period Weeks    Status On-going                 Plan - 11/16/19 1235    Clinical Impression Statement Pt continues to make small gains with end range extension, measuring -8 today vs -10 previously.  He is ambulating without cane with more symmetrical gait pattern.  He is using stairs with alternating pattern for both up/down.  Improved Lt quad activation in closed and open chain.  Pt able to perform sit to stand from lower surface with equal WB bil LEs, holding 10lb weight today.  Ongoing limited scar mobility distally so PT performed scar and soft tissue massage surrounding lower patellar region.  Pt will continue to benefit from skilled PT to progress end range A/ROM limitations and strength of Lt LE.    Rehab Potential Good    PT Frequency 3x / week    PT Duration 8 weeks    PT Treatment/Interventions ADLs/Self Care Home Management;Cryotherapy;Electrical Stimulation;Moist Heat;Neuromuscular re-education;Therapeutic exercise;Functional mobility training;Stair training;Gait training;Therapeutic activities;Patient/family education;Manual techniques;Dry needling;Passive range of motion;Scar mobilization;Taping;Joint Manipulations    PT Next Visit Plan KX modifier, work on scar, work on increase step length and heel strike, exercises to increase quad strength    PT Home Exercise Plan Access Code: [email or web address]    Consulted and Agree with Plan of Care Patient           Patient will benefit from skilled therapeutic intervention in order to improve the following deficits and impairments:     Visit Diagnosis: Chronic pain of left knee  Muscle weakness (generalized)     Problem List Patient Active Problem List   Diagnosis Date Noted  . S/P left TKA  09/29/2019  . Status post total left knee replacement 09/29/2019  . Preoperative cardiovascular examination 08/18/2019  . S/P right inguinal hernia repair 03/29/2018  . New onset left bundle branch block (LBBB) 01/12/2018  . Atherosclerotic vascular disease 01/12/2018  . 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 Port catheter in place 07/02/2016  . 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 Elevated fasting glucose 04/10/2016  . Right inguinal hernia 04/10/2016  . Adenocarcinoma of colon ([place]) 04/10/2016  . Enterocolitis   . Essential hypertension 04/04/2016  . Acute kidney injury superimposed on chronic kidney disease Stage 2 ([place]) 04/04/2016  . Ileus ([place]) 04/02/2016    Nilton Lave, PT 11/16/19 12:40 PM   [facility] [address], [place], [place], [address] Phone: [phone]   Fax:  [phone]  Name: Louis [name] MRN: [id number] Date of Birth: [birth year]

## 2019-11-21 ENCOUNTER — Ambulatory Visit: Payer: Medicare Other | Admitting: Physical Therapy

## 2019-11-21 ENCOUNTER — Other Ambulatory Visit: Payer: Self-pay

## 2019-11-21 ENCOUNTER — Inpatient Hospital Stay: Payer: Medicare Other

## 2019-11-21 ENCOUNTER — Other Ambulatory Visit: Payer: Self-pay | Admitting: *Deleted

## 2019-11-21 ENCOUNTER — Encounter: Payer: Self-pay | Admitting: Physical Therapy

## 2019-11-21 ENCOUNTER — Inpatient Hospital Stay: Payer: Medicare Other | Attending: Nurse Practitioner

## 2019-11-21 DIAGNOSIS — M25561 Pain in right knee: Secondary | ICD-10-CM | POA: Diagnosis not present

## 2019-11-21 DIAGNOSIS — Z85038 Personal history of other malignant neoplasm of large intestine: Secondary | ICD-10-CM | POA: Insufficient documentation

## 2019-11-21 DIAGNOSIS — M6281 Muscle weakness (generalized): Secondary | ICD-10-CM | POA: Diagnosis not present

## 2019-11-21 DIAGNOSIS — G8929 Other chronic pain: Secondary | ICD-10-CM

## 2019-11-21 DIAGNOSIS — C189 Malignant neoplasm of colon, unspecified: Secondary | ICD-10-CM

## 2019-11-21 DIAGNOSIS — Z9221 Personal history of antineoplastic chemotherapy: Secondary | ICD-10-CM | POA: Insufficient documentation

## 2019-11-21 DIAGNOSIS — M25562 Pain in left knee: Secondary | ICD-10-CM | POA: Diagnosis not present

## 2019-11-21 DIAGNOSIS — R609 Edema, unspecified: Secondary | ICD-10-CM | POA: Diagnosis not present

## 2019-11-21 NOTE — Therapy (Signed)
[facility] [address] [place], [place], [address] Phone: [phone]   Fax:  [phone]  Physical Therapy Treatment  Patient Details  Name: Louis [name] MRN: [id number] Date of Birth: Nov 02, 1945 Referring Provider (PT): Paralee Cancel, MD   Encounter Date: 11/21/2019   PT End of Session - 11/21/19 1322    Visit Number 16    Date for PT Re-Evaluation 12/05/19    Authorization Type Medicare, KX    Progress Note Due on Visit 20    PT Start Time 1230    PT Stop Time 1310    PT Time Calculation (min) 40 min    Activity Tolerance Patient tolerated treatment well;No increased pain    Behavior During Therapy WFL for tasks assessed/performed           Past Medical History:  Diagnosis Date  . Arthritis   . Ascending aorta dilatation (HCC)    4.1 cm noted on CT1/15/21  . CKD (chronic kidney disease) stage 2, GFR 60-89 ml/min    see [place], Stage 1  . colon ca dx'd 04/2016   colon  . Hernia, abdominal   . Hyperlipidemia   . Hypertension   . LBBB (left bundle branch block) 08/18/2019  . Neuropathy   . Pneumonia 05/19/2018  . Seasonal allergies   . Thrombocytopenia ([place])    during chemo.    Past Surgical History:  Procedure Laterality Date  . BIOPSY  04/10/2016   Procedure: BIOPSY Mesentary;  Surgeon: Judeth Horn, MD;  Location: [place];  Service: General;;  . COLOSTOMY    . COLOSTOMY REVERSAL N/A 01/12/2017   Procedure: COLOSTOMY REVERSAL;  Surgeon: Judeth Horn, MD;  Location: [place];  Service: General;  Laterality: N/A;  . FLEXIBLE SIGMOIDOSCOPY N/A 04/09/2016   Procedure: Beryle Quant;  Surgeon: Ladene Artist, MD;  Location: [facility] ENDOSCOPY;  Service: Endoscopy;  Laterality: N/A;  . HERNIA REPAIR     in childhood  . INGUINAL HERNIA REPAIR Right 03/29/2018   Procedure: OPEN REPAIR OF RIGHT INGUINAL HERNIA WITH MESH;  Surgeon: Judeth Horn, MD;  Location: [place];  Service: General;  Laterality:  Right;  . INSERTION OF MESH N/A 03/29/2018   Procedure: INSERTION OF MESH;  Surgeon: Judeth Horn, MD;  Location: [place];  Service: General;  Laterality: N/A;  . IR GENERIC HISTORICAL  05/14/2016   IR US GUIDE VASC ACCESS RIGHT 05/14/2016 Corrie Mckusick, DO [facility]  . IR GENERIC HISTORICAL  05/14/2016   IR FLUORO GUIDE PORT INSERTION RIGHT 05/14/2016 Corrie Mckusick, DO [facility]  . IR REMOVAL TUN ACCESS W/ PORT W/O FL MOD SED  11/11/2016  . PARTIAL COLECTOMY N/A 04/10/2016   Procedure: SIGMOID  COLECTOMY WITH COLOSTOMY;  Surgeon: Judeth Horn, MD;  Location: [place];  Service: General;  Laterality: N/A;  . TONSILLECTOMY    . TOTAL KNEE ARTHROPLASTY Left 09/29/2019   Procedure: TOTAL KNEE ARTHROPLASTY;  Surgeon: Paralee Cancel, MD;  [facility];  Service: Orthopedics;  Laterality: Left;  70 mins    There were no vitals filed for this visit.   Subjective Assessment - 11/21/19 1233    Subjective I am feeling stronger. I feel good.    Limitations Walking;Standing;Sitting;House hold activities    How long can you walk comfortably? 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 No/denies  [facility] PT Assessment - 11/21/19 0001      PROM   Left Knee Extension -10    Left Knee Flexion 120                         [facility] Adult PT Treatment/Exercise - 11/21/19 0001      Knee/Hip Exercises: Aerobic   Recumbent Bike L5 x 5 min while assessing patient      Knee/Hip Exercises: Machines for Strengthening   Cybex Leg Press 2 legs 75lb x 15 reps, 80lb x 15 reps, Lt LE only 35lb 1x15, 40# 15x   next visit starte at 80#     Knee/Hip Exercises: Standing   SLS on left and pick up cones on mat while holding onto wobble pole    Walking with Sports Cord 25# forward, backward, sideways 5 times each   increased control, forward and backward hardest   Other Standing Knee Exercises step  up and over on 4 inch step    Other Standing Knee Exercises walk through floor ladder with VC to pick up feet and heel toe gait      Knee/Hip Exercises: Seated   Long Arc Quad Strengthening;Left;2 sets;10 reps;Weights    Long Arc Quad Weight 8 lbs.      Manual Therapy   Manual Therapy Soft tissue mobilization    Soft tissue mobilization scar massage lower third of scar, Lt anterior knee/leg                    PT Short Term Goals - 11/02/19 1102      PT SHORT TERM GOAL #1   Title ind with initial HEP    Time 3    Status Achieved      PT SHORT TERM GOAL #2   Title Pt will be able to tolerate at least 10 min of standing/walking for household activity.    Baseline 5'    Time 4    Period Weeks    Status Achieved      PT SHORT TERM GOAL #3   Title Lt knee ROM at least -10 ext and 95 flexion    Time 4    Period Weeks    Status Achieved      PT SHORT TERM GOAL #4   Title Pt will transition to a [facility] for household and short community outings.    Time 4    Period Weeks    Status Achieved             PT Long Term Goals - 11/10/19 1108      PT LONG TERM GOAL #1   Title Pt will achieve Lt LE strength of at least 4+/5 for improved transfers, gait endurance and stairs.    Time 8    Period Weeks    Status On-going      PT LONG TERM GOAL #2   Title Improved Lt knee A/ROM to at least -5 ext and 115 flexion to normalize gait and functional squatting range.    Baseline -10 to 119 measured 7/6    Time 8    Period Weeks    Status On-going      PT LONG TERM GOAL #3   Title able to demo stairs with alternating pattern with single rail    Time 8    Period Weeks    Status Achieved      PT LONG TERM GOAL #4   Title Pt  will be able to tolerate standing household and community activities x at least 30 min with pain not to exceed 3/10 in Lt knee.    Time 8    Period Weeks    Status Achieved      PT LONG TERM GOAL #5   Title reduce FOTO to </= 51% limitation for  improved function    Baseline 78%    Time 8    Period Weeks    Status On-going                 Plan - 11/21/19 1322    Clinical Impression Statement Patient was able to do the heel toe gait in the floor ladder without touching the plastic.Patient is able to go up in weight with the leg press. He had increased resistance for the bike. Patient will benefit from skilled therapy to work on lef tknee strength and ROM.    Personal Factors and Comorbidities Time since onset of injury/illness/exacerbation    Examination-Activity Limitations Locomotion Level;Transfers;Squat;Stairs;Sit;Stand    Examination-Participation Restrictions Community Activity;Driving;Yard Work;Shop    Stability/Clinical Decision Making Stable/Uncomplicated    Rehab Potential Good    PT Frequency 3x / week    PT Duration 8 weeks    PT Treatment/Interventions ADLs/Self Care Home Management;Cryotherapy;Electrical Stimulation;Moist Heat;Neuromuscular re-education;Therapeutic exercise;Functional mobility training;Stair training;Gait training;Therapeutic activities;Patient/family education;Manual techniques;Dry needling;Passive range of motion;Scar mobilization;Taping;Joint Manipulations    PT Next Visit Plan KX modifier, work on scar, work on increase step length and heel strike, exercises to increase quad strength    PT Home Exercise Plan Access Code: [email or web address]    Consulted and Agree with Plan of Care Patient           Patient will benefit from skilled therapeutic intervention in order to improve the following deficits and impairments:  Abnormal gait, Decreased range of motion, Difficulty walking, Decreased activity tolerance, Pain, Hypomobility, Impaired flexibility, Decreased strength, Postural dysfunction, Increased edema  Visit Diagnosis: Chronic pain of left knee  Muscle weakness (generalized)  Chronic pain of right knee     Problem List Patient Active Problem List   Diagnosis Date Noted  . S/P left TKA  09/29/2019  . Status post total left knee replacement 09/29/2019  . Preoperative cardiovascular examination 08/18/2019  . S/P right inguinal hernia repair 03/29/2018  . New onset left bundle branch block (LBBB) 01/12/2018  . Atherosclerotic vascular disease 01/12/2018  . 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 Port catheter in place 07/02/2016  . Aorto-iliac atherosclerosis ([place]) 04/10/2016  . Elevated fasting glucose 04/10/2016  . Right inguinal hernia 04/10/2016  . Adenocarcinoma of colon ([place]) 04/10/2016  . Enterocolitis   . Essential hypertension 04/04/2016  . Acute kidney injury superimposed on chronic kidney disease Stage 2 ([place]) 04/04/2016  . Ileus ([place]) 04/02/2016    Earlie Counts, PT 11/21/19 1:25 PM   [facility] [address], [place], [place], [address] Phone: [phone]   Fax:  [phone]  Name: JAICION LAURIE MRN: [id number] Date of Birth: 1945/06/22

## 2019-11-22 ENCOUNTER — Telehealth: Payer: Self-pay | Admitting: *Deleted

## 2019-11-22 LAB — CEA (IN HOUSE-CHCC): CEA (CHCC-In House): 1.23 ng/mL (ref 0.00–5.00)

## 2019-11-22 NOTE — Telephone Encounter (Signed)
-----   Message from Ladell Pier, MD sent at 11/22/2019  1:15 PM EDT ----- He was supposed to have an office visit, I told them this when he called last week  CEA is normal, schedule follow-up next few weeks

## 2019-11-22 NOTE — Telephone Encounter (Signed)
Left VM with normal CEA and that he was supposed have been scheduled to see Dr. Benay Spice that day as well. Would like to see him in few weeks for his routine f/u. Scheduling will be calling. Scheduling message sent.

## 2019-11-23 ENCOUNTER — Ambulatory Visit: Payer: Medicare Other | Admitting: Physical Therapy

## 2019-11-23 ENCOUNTER — Other Ambulatory Visit: Payer: Self-pay

## 2019-11-23 ENCOUNTER — Encounter: Payer: Self-pay | Admitting: Physical Therapy

## 2019-11-23 DIAGNOSIS — R609 Edema, unspecified: Secondary | ICD-10-CM | POA: Diagnosis not present

## 2019-11-23 DIAGNOSIS — G8929 Other chronic pain: Secondary | ICD-10-CM | POA: Diagnosis not present

## 2019-11-23 DIAGNOSIS — M25562 Pain in left knee: Secondary | ICD-10-CM | POA: Diagnosis not present

## 2019-11-23 DIAGNOSIS — M6281 Muscle weakness (generalized): Secondary | ICD-10-CM

## 2019-11-23 DIAGNOSIS — M25561 Pain in right knee: Secondary | ICD-10-CM | POA: Diagnosis not present

## 2019-11-23 NOTE — Therapy (Signed)
[facility] [address], [place], [place], [address] Phone: [phone]   Fax:  [phone]  Physical Therapy Treatment  Patient Details  Name: Louis [name] MRN: [id number] Date of Birth: 01/07/1946 Referring Provider (PT): Paralee Cancel, MD   Encounter Date: 11/23/2019   PT End of Session - 11/23/19 0929    Visit Number 17    Date for PT Re-Evaluation 12/05/19    Authorization Type Medicare, KX    PT Start Time 0845    PT Stop Time 0928    PT Time Calculation (min) 43 min    Activity Tolerance Patient tolerated treatment well;No increased pain    Behavior During Therapy WFL for tasks assessed/performed           Past Medical History:  Diagnosis Date  . Arthritis   . Ascending aorta dilatation (HCC)    4.1 cm noted on CT1/15/21  . CKD (chronic kidney disease) stage 2, GFR 60-89 ml/min    see [place], Stage 1  . colon ca dx'd 04/2016   colon  . Hernia, abdominal   . Hyperlipidemia   . Hypertension   . LBBB (left bundle branch block) 08/18/2019  . Neuropathy   . Pneumonia 05/19/2018  . Seasonal allergies   . 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    Past Surgical History:  Procedure Laterality Date  . BIOPSY  04/10/2016   Procedure: BIOPSY Mesentary;  Surgeon: Judeth Horn, MD;  Location: [place];  Service: General;;  . COLOSTOMY    . COLOSTOMY REVERSAL N/A 01/12/2017   Procedure: COLOSTOMY REVERSAL;  Surgeon: Judeth Horn, MD;  Location: [place];  Service: General;  Laterality: N/A;  . FLEXIBLE SIGMOIDOSCOPY N/A 04/09/2016   Procedure: Beryle Quant;  Surgeon: Ladene Artist, MD;  Location: [facility] ENDOSCOPY;  Service: Endoscopy;  Laterality: N/A;  . HERNIA REPAIR     in childhood  . INGUINAL HERNIA REPAIR Right 03/29/2018   Procedure: OPEN REPAIR OF RIGHT INGUINAL HERNIA WITH MESH;  Surgeon: Judeth Horn, MD;  Location: [place];  Service: General;  Laterality: Right;  . INSERTION OF MESH N/A  03/29/2018   Procedure: INSERTION OF MESH;  Surgeon: Judeth Horn, MD;  Location: [place];  Service: General;  Laterality: N/A;  . IR GENERIC HISTORICAL  05/14/2016   IR US GUIDE VASC ACCESS RIGHT 05/14/2016 Corrie Mckusick, DO [facility]  . IR GENERIC HISTORICAL  05/14/2016   IR FLUORO GUIDE PORT INSERTION RIGHT 05/14/2016 Corrie Mckusick, DO [facility]  . IR REMOVAL TUN ACCESS W/ PORT W/O FL MOD SED  11/11/2016  . PARTIAL COLECTOMY N/A 04/10/2016   Procedure: SIGMOID  COLECTOMY WITH COLOSTOMY;  Surgeon: Judeth Horn, MD;  Location: [place];  Service: General;  Laterality: N/A;  . TONSILLECTOMY    . TOTAL KNEE ARTHROPLASTY Left 09/29/2019   Procedure: TOTAL KNEE ARTHROPLASTY;  Surgeon: Paralee Cancel, MD;  Location: [facility];  Service: Orthopedics;  Laterality: Left;  70 mins    There were no vitals filed for this visit.   Subjective Assessment - 11/23/19 0850    Subjective I feel good. NO trouble with driving or working.    How long can you walk comfortably? 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 No/denies              [facility] PT Assessment - 11/23/19 0001  Assessment   Medical Diagnosis Z96.652 (ICD-10-CM) - Presence of left artificial knee joint    Referring Provider (PT) Paralee Cancel, MD    Onset Date/Surgical Date 09/29/19    Hand Dominance Left    Next MD Visit 10/12/19    Prior Therapy yes, 2 visits with Dr. Aurea Graff office      Precautions   Precautions None      Restrictions   Weight Bearing Restrictions No      Prior Function   Level of Independence Independent with household mobility with device;Independent with community mobility with device      Cognition   Overall Cognitive Status Within Functional Limits for tasks assessed      Observation/Other Assessments   Focus on Therapeutic Outcomes (FOTO)  41% limitation      Posture/Postural Control   Posture/Postural Control  No significant limitations      PROM   Left Knee Extension -10    Left Knee Flexion 120      Strength   Left Knee Flexion 5/5    Left Knee Extension 4+/5                         [facility] Adult PT Treatment/Exercise - 11/23/19 0001      Knee/Hip Exercises: Aerobic   Recumbent Bike L5 x 6 min while assessing patient      Knee/Hip Exercises: Machines for Strengthening   Cybex Leg Press 2 legs  80lb 2x 15 reps, Lt LE  40# 15x2      Knee/Hip Exercises: Standing   Knee Flexion Strengthening;Left;2 sets;15 reps    Knee Flexion Limitations 8 #    SLS on left and pick up cones on mat while holding onto wobble pole    Other Standing Knee Exercises step up and over on 6 inch step; then did with 8 inch step      Knee/Hip Exercises: Seated   Long Arc Quad Strengthening;Left;2 sets;10 reps;Weights    Long Arc Quad Weight 8 lbs.      Manual Therapy   Manual Therapy Soft tissue mobilization    Soft tissue mobilization scar massage lower third of scar, Lt anterior knee/leg                    PT Short Term Goals - 11/02/19 1102      PT SHORT TERM GOAL #1   Title ind with initial HEP    Time 3    Status Achieved      PT SHORT TERM GOAL #2   Title Pt will be able to tolerate at least 10 min of standing/walking for household activity.    Baseline 5'    Time 4    Period Weeks    Status Achieved      PT SHORT TERM GOAL #3   Title Lt knee ROM at least -10 ext and 95 flexion    Time 4    Period Weeks    Status Achieved      PT SHORT TERM GOAL #4   Title Pt will transition to a [facility] for household and short community outings.    Time 4    Period Weeks    Status Achieved             PT Long Term Goals - 11/23/19 0851      PT LONG TERM GOAL #1   Title Pt will achieve Lt LE strength of at least 4+/5  for improved transfers, gait endurance and stairs.    Time 8    Period Weeks    Status Achieved      PT LONG TERM GOAL #2   Title Improved Lt knee A/ROM  to at least -5 ext and 115 flexion to normalize gait and functional squatting range.    Baseline -10 to 119 measured 7/6    Time 8    Period Weeks    Status Achieved      PT LONG TERM GOAL #3   Title able to demo stairs with alternating pattern with single rail    Time 8    Period Weeks    Status Achieved      PT LONG TERM GOAL #4   Title Pt will be able to tolerate standing household and community activities x at least 30 min with pain not to exceed 3/10 in Lt knee.    Time 8    Period Weeks    Status Achieved      PT LONG TERM GOAL #5   Title reduce FOTO to </= 51% limitation for improved function    Time 8    Period Weeks    Status Achieved                 Plan - 11/23/19 0929    Clinical Impression Statement Patient has met all of his goals. He has increased strength of left knee and functional ROM of left knee. Patient is able to go up and down 4 inch and 8 inch step with a railing. Patient is not having pain. Patient is independent with his HEP. Patient is able to walk as much as he wants to. Patient is ready for discharge.    Personal Factors and Comorbidities Time since onset of injury/illness/exacerbation    Examination-Activity Limitations Locomotion Level;Transfers;Squat;Stairs;Sit;Stand    Examination-Participation Restrictions Community Activity;Driving;Yard Work;Shop    Stability/Clinical Decision Making Stable/Uncomplicated    Rehab Potential Good    PT Treatment/Interventions ADLs/Self Care Home Management;Cryotherapy;Electrical Stimulation;Moist Heat;Neuromuscular re-education;Therapeutic exercise;Functional mobility training;Stair training;Gait training;Therapeutic activities;Patient/family education;Manual techniques;Dry needling;Passive range of motion;Scar mobilization;Taping;Joint Manipulations    PT Next Visit Plan Discharge to HEP    PT Home Exercise Plan Access Code: [email or web address]    Consulted and Agree with Plan of Care Patient           Patient  will benefit from skilled therapeutic intervention in order to improve the following deficits and impairments:  Abnormal gait, Decreased range of motion, Difficulty walking, Decreased activity tolerance, Pain, Hypomobility, Impaired flexibility, Decreased strength, Postural dysfunction, Increased edema  Visit Diagnosis: Chronic pain of left knee  Muscle weakness (generalized)  Chronic pain of right knee  Edema, unspecified type     Problem List Patient Active Problem List   Diagnosis Date Noted  . S/P left TKA 09/29/2019  . Status post total left knee replacement 09/29/2019  . Preoperative cardiovascular examination 08/18/2019  . S/P right inguinal hernia repair 03/29/2018  . New onset left bundle branch block (LBBB) 01/12/2018  . Atherosclerotic vascular disease 01/12/2018  . 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 Port catheter in place 07/02/2016  . 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 Elevated fasting glucose 04/10/2016  . Right inguinal hernia 04/10/2016  . Adenocarcinoma of colon ([place]) 04/10/2016  . Enterocolitis   . Essential hypertension 04/04/2016  . Acute kidney injury superimposed on chronic kidney disease Stage 2 ([place]) 04/04/2016  . Ileus ([place]) 04/02/2016    Earlie Counts, PT 11/23/19 9:34  AM   [facility] [address] [place], [place], [address] Phone: [phone]   Fax:  [phone]  Name: HURSHELL DINO MRN: [id number] Date of Birth: [birth year]  PHYSICAL THERAPY DISCHARGE SUMMARY  Visits from Start of Care: 17  Current functional level related to goals / functional outcomes: See above.   Remaining deficits: See above.    Education / Equipment: HEP Plan: Patient agrees to discharge.  Patient goals were met. Patient is being discharged due to meeting the stated rehab goals.  Thank you for the referral. Earlie Counts, PT 11/23/19 9:35 AM  ?????

## 2019-11-25 ENCOUNTER — Inpatient Hospital Stay: Payer: Medicare Other | Admitting: Nurse Practitioner

## 2019-11-28 ENCOUNTER — Ambulatory Visit: Payer: Medicare Other | Admitting: Physical Therapy

## 2019-11-29 ENCOUNTER — Encounter: Payer: Self-pay | Admitting: Nurse Practitioner

## 2019-11-29 ENCOUNTER — Other Ambulatory Visit: Payer: Self-pay

## 2019-11-29 ENCOUNTER — Inpatient Hospital Stay (HOSPITAL_BASED_OUTPATIENT_CLINIC_OR_DEPARTMENT_OTHER): Payer: Medicare Other | Admitting: Nurse Practitioner

## 2019-11-29 VITALS — BP 140/67 | HR 66 | Temp 97.5°F | Resp 18 | Ht 76.0 in | Wt 245.6 lb

## 2019-11-29 DIAGNOSIS — C189 Malignant neoplasm of colon, unspecified: Secondary | ICD-10-CM

## 2019-11-29 DIAGNOSIS — Z9221 Personal history of antineoplastic chemotherapy: Secondary | ICD-10-CM | POA: Diagnosis not present

## 2019-11-29 DIAGNOSIS — Z85038 Personal history of other malignant neoplasm of large intestine: Secondary | ICD-10-CM | POA: Diagnosis not present

## 2019-11-29 NOTE — Progress Notes (Signed)
  [place] OFFICE PROGRESS NOTE   Diagnosis: Colon cancer  INTERVAL HISTORY:   Mr. Fuson returns as scheduled.  He feels well.  No change in bowel habits.  No abdominal pain.  He has a good appetite.  He canceled his colonoscopy January 2020 due to the Covid pandemic.  He has not rescheduled.  He reports undergoing a left total knee replacement in May of this year.  He feels he has recovered well.  Objective:  Vital signs in last 24 hours:  Blood pressure (!) 140/67, pulse 66, temperature (!) 97.5 F (36.4 C), temperature source Temporal, resp. rate 18, height '6\' 4"'$  (1.93 m), weight (!) 245 lb 9.6 oz (111.4 kg), SpO2 100 %.    HEENT: Neck without mass. Lymphatics: No palpable cervical, supraclavicular, axillary or inguinal lymph nodes. Resp: Lungs clear bilaterally. Cardio: Regular rate and rhythm. GI: Abdomen soft and nontender.  No hepatomegaly.  No mass. Vascular: Trace edema left lower leg.    Lab Results:  Lab Results  Component Value Date   WBC 5.1 09/20/2019   HGB 13.5 09/20/2019   HCT 41.7 09/20/2019   MCV 93.3 09/20/2019   PLT 164 09/20/2019   NEUTROABS 3.0 01/07/2018    Imaging:  No results found.  Medications: I have reviewed the patient's current medications.  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 Sigmoid/descending resection and creation of a descending colostomy 04/10/2016 ? MSI-stable, no loss of mismatch repair protein expression ? Foundation 1-BRAF V600Epositive, MS-stable, intermediate tumor mutation burden, no RAS mutation ? CT abdomen/pelvis 04/02/2016-no evidence of distant metastatic disease ? Cycle 1 FOLFOX 05/21/2016 ? Cycle 2 FOLFOX 06/04/2016 ? Cycle 3 FOLFOX 06/18/2016 ? Cycle 4 FOLFOX 07/02/2016 (oxaliplatin held secondary to thrombocytopenia) ? Cycle 5 FOLFOX 07/16/2016 ? Cycle 6 FOLFOX 07/30/2016 ? Cycle 7 FOLFOX  08/13/2016 (oxaliplatin held and 5-FU dose reduced) ? Cycle 8 FOLFOX 09/03/2016 (oxaliplatin held secondary to neuropathy) ? Cycle 9 FOLFOX 09/17/2016 (oxaliplatin held secondary to neuropathy) ? Cycle 10 FOLFOX 10/02/2016 ? Cycle 11 FOLFOX 10/15/2016 (oxaliplatin eliminated from the regimen) ? Cycle 12 FOLFOX 10/30/2016 (oxaliplatin eliminated) ? CTs 05/12/2017-no evidence of recurrent disease, right inguinal hernia containing bladder ? Colonoscopy 07/21/2017, 3 polyps were removed from the descending and transverse colon, fragments of tubular and tubulovillous adenoma ? CTs 05/19/2018-no evidence of recurrent disease, status post hernia repair, right upper lobe pneumonia ? CTs 05/20/2019-resolution of right upper lobe pneumonia, no evidence of metastatic disease  2. Chronic renal insufficiency  3. Hypertension  4. Inflamed sebaceous cyst at the upper back-status post incision and drainage  5. Thrombocytopenia secondary chemotherapy-oxaliplatin dose reduced beginning with cycle 3 FOLFOX, oxaliplatin held with cycle 4 and cycle 7 FOLFOX  6. Oxaliplatin neuropathy-improved  7. History ofMucositis secondary chemotherapy  8.  Symptoms of pneumonia January 2020-CT 05/19/2018 consistent with right upper lobe pneumonia, Levaquin prescribed 05/20/2018  9.  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 Ndiaye remains in clinical remission from colon cancer.  CEA a few weeks ago was in normal range.  We made a referral to Dr. Fuller Plan to reschedule his colonoscopy.  He will return for a CEA and follow-up visit in 6 months.  He will contact the office in the interim with any problems.    Ned Card ANP/GNP-BC   11/29/2019  10:35 AM

## 2019-11-30 ENCOUNTER — Telehealth: Payer: Self-pay | Admitting: Nurse Practitioner

## 2019-11-30 ENCOUNTER — Ambulatory Visit: Payer: Medicare Other | Admitting: Physical Therapy

## 2019-11-30 ENCOUNTER — Encounter: Payer: Self-pay | Admitting: Gastroenterology

## 2019-11-30 NOTE — Telephone Encounter (Signed)
Scheduled per 7/27 los. Pt is aware of appt time and date. Mailing appt calendar for pt per pt request.

## 2019-12-07 ENCOUNTER — Ambulatory Visit: Payer: Medicare Other | Admitting: Physical Therapy

## 2019-12-14 ENCOUNTER — Encounter: Payer: Medicare Other | Admitting: Physical Therapy

## 2019-12-16 ENCOUNTER — Other Ambulatory Visit: Payer: Self-pay

## 2019-12-16 ENCOUNTER — Ambulatory Visit (AMBULATORY_SURGERY_CENTER): Payer: Self-pay

## 2019-12-16 VITALS — Ht 76.0 in | Wt 244.0 lb

## 2019-12-16 DIAGNOSIS — Z85038 Personal history of other malignant neoplasm of large intestine: Secondary | ICD-10-CM

## 2019-12-16 MED ORDER — SUTAB 1479-225-188 MG PO TABS: 1.0000 | ORAL_TABLET | ORAL | 0 refills | Status: DC

## 2019-12-16 NOTE — Progress Notes (Signed)
No egg or soy allergy known to patient  No issues with past sedation with any surgeries or procedures No intubation problems in the past  No FH of Malignant Hyperthermia No diet pills per patient No home 02 use per patient  No blood thinners per patient  Pt denies issues with constipation  No A fib or A flutter ----dx 08/2019 with L BBB EMMI video via MyChart  COVID 19 guidelines implemented in [place] today with Pt and RN   Coupon given to pt in [facility] today , Code to Pharmacy  COVID vaccines completed on 06/2019 per pt;  Patient verbalized he is "not going to do that 2 days worth of prep -I will do the regular prep"; patient advised that MD requested 2 day prep and given instructions for 2 day prep;   Due to the COVID-19 pandemic we are asking patients to follow these guidelines. Please only bring one care partner. Please be aware that your care partner may wait in the car in the parking lot or if they feel like they will be too hot to wait in the car, they may wait in the lobby on the 4th floor. All care partners are required to wear a mask the entire time (we do not have any that we can provide them), they need to practice social distancing, and we will do a Covid check for all patient's and care partners when you arrive. Also we will check their temperature and your temperature. If the care partner waits in their car they need to stay in the parking lot the entire time and we will call them on their cell phone when the patient is ready for discharge so they can bring the car to the front of the building. Also all patient's will need to wear a mask into building.

## 2019-12-22 DIAGNOSIS — E785 Hyperlipidemia, unspecified: Secondary | ICD-10-CM | POA: Diagnosis not present

## 2019-12-22 DIAGNOSIS — N179 Acute kidney failure, unspecified: Secondary | ICD-10-CM | POA: Diagnosis not present

## 2019-12-22 DIAGNOSIS — N184 Chronic kidney disease, stage 4 (severe): Secondary | ICD-10-CM | POA: Diagnosis not present

## 2019-12-22 DIAGNOSIS — I129 Hypertensive chronic kidney disease with stage 1 through stage 4 chronic kidney disease, or unspecified chronic kidney disease: Secondary | ICD-10-CM | POA: Diagnosis not present

## 2019-12-22 DIAGNOSIS — N2581 Secondary hyperparathyroidism of renal origin: Secondary | ICD-10-CM | POA: Diagnosis not present

## 2019-12-22 DIAGNOSIS — N1832 Chronic kidney disease, stage 3b: Secondary | ICD-10-CM | POA: Diagnosis not present

## 2019-12-22 DIAGNOSIS — E669 Obesity, unspecified: Secondary | ICD-10-CM | POA: Diagnosis not present

## 2019-12-22 DIAGNOSIS — C187 Malignant neoplasm of sigmoid colon: Secondary | ICD-10-CM | POA: Diagnosis not present

## 2020-01-25 ENCOUNTER — Ambulatory Visit (AMBULATORY_SURGERY_CENTER): Payer: Medicare Other | Admitting: Gastroenterology

## 2020-01-25 ENCOUNTER — Other Ambulatory Visit: Payer: Self-pay

## 2020-01-25 ENCOUNTER — Encounter: Payer: Self-pay | Admitting: Gastroenterology

## 2020-01-25 VITALS — BP 142/79 | HR 59 | Temp 97.3°F | Resp 11 | Ht 76.0 in | Wt 244.0 lb

## 2020-01-25 DIAGNOSIS — D123 Benign neoplasm of transverse colon: Secondary | ICD-10-CM

## 2020-01-25 DIAGNOSIS — K639 Disease of intestine, unspecified: Secondary | ICD-10-CM

## 2020-01-25 DIAGNOSIS — K922 Gastrointestinal hemorrhage, unspecified: Secondary | ICD-10-CM | POA: Diagnosis not present

## 2020-01-25 DIAGNOSIS — D120 Benign neoplasm of cecum: Secondary | ICD-10-CM | POA: Diagnosis not present

## 2020-01-25 DIAGNOSIS — Z85038 Personal history of other malignant neoplasm of large intestine: Secondary | ICD-10-CM | POA: Diagnosis not present

## 2020-01-25 MED ORDER — SODIUM CHLORIDE 0.9 % IV SOLN
500.0000 mL | Freq: Once | INTRAVENOUS | Status: DC
Start: 2020-01-25 — End: 2020-01-25

## 2020-01-25 MED ORDER — FLEET ENEMA 7-19 GM/118ML RE ENEM
1.0000 | ENEMA | Freq: Once | RECTAL | Status: AC
  Administered 2020-01-25: 1 via RECTAL

## 2020-01-25 NOTE — Progress Notes (Signed)
Called to room to assist during endoscopic procedure.  Patient ID and intended procedure confirmed with present staff. Received instructions for my participation in the procedure from the performing physician.  

## 2020-01-25 NOTE — Progress Notes (Signed)
To PACU< VSS. Report to [email or web address] 

## 2020-01-25 NOTE — Patient Instructions (Signed)
Handout given:  Polyps Resume previous diet  continue present medications Await pathology results NO ASPIRIN, IBUPROFEN, NAPROXEN OR OTHER NSAIDS FOR 2 WEEKS AFTER PROCEDURE TODAY  YOU HAD AN ENDOSCOPIC PROCEDURE TODAY AT [facility]:   Refer to the procedure report that was given to you for any specific questions about what was found during the examination.  If the procedure report does not answer your questions, please call your gastroenterologist to clarify.  If you requested that your care partner not be given the details of your procedure findings, then the procedure report has been included in a sealed envelope for you to review at your convenience later.  YOU SHOULD EXPECT: Some feelings of bloating in the abdomen. Passage of more gas than usual.  Walking can help get rid of the air that was put into your GI tract during the procedure and reduce the bloating. If you had a lower endoscopy (such as a colonoscopy or flexible sigmoidoscopy) you may notice spotting of blood in your stool or on the toilet paper. If you underwent a bowel prep for your procedure, you may not have a normal bowel movement for a few days.  Please Note:  You might notice some irritation and congestion in your nose or some drainage.  This is from the oxygen used during your procedure.  There is no need for concern and it should clear up in a day or so.  SYMPTOMS TO REPORT IMMEDIATELY:   Following lower endoscopy (colonoscopy or flexible sigmoidoscopy):  Excessive amounts of blood in the stool  Significant tenderness or worsening of abdominal pains  Swelling of the abdomen that is new, acute  Fever of 100F or higher   For urgent or emergent issues, a gastroenterologist can be reached at any hour by calling [phone]. Do not use MyChart messaging for urgent concerns.    DIET:  We do recommend a small meal at first, but then you may proceed to your regular diet.  Drink plenty of fluids but you  should avoid alcoholic beverages for 24 hours.  ACTIVITY:  You should plan to take it easy for the rest of today and you should NOT DRIVE or use heavy machinery until tomorrow (because of the sedation medicines used during the test).    FOLLOW UP: Our staff will call the number listed on your records 48-72 hours following your procedure to check on you and address any questions or concerns that you may have regarding the information given to you following your procedure. If we do not reach you, we will leave a message.  We will attempt to reach you two times.  During this call, we will ask if you have developed any symptoms of COVID 19. If you develop any symptoms (ie: fever, flu-like symptoms, shortness of breath, cough etc.) before then, please call [phone].  If you test positive for Covid 19 in the 2 weeks post procedure, please call and report this information to [place].    If any biopsies were taken you will be contacted by phone or by letter within the next 1-3 weeks.  Please call us at [phone] if you have not heard about the biopsies in 3 weeks.    SIGNATURES/CONFIDENTIALITY: You and/or your care partner have signed paperwork which will be entered into your electronic medical record.  These signatures attest to the fact that that the information above on your After Visit Summary has been reviewed and is understood.  Full responsibility of the confidentiality  of this discharge information lies with you and/or your care-partner.

## 2020-01-25 NOTE — Op Note (Signed)
[place] Patient Name: Louis [name] Procedure Date: 01/25/2020 11:51 AM MRN: [id number] Endoscopist: Ladene Artist , MD Age: 74 Referring MD:  Date of Birth: 02-Jan-1946 Gender: Male Account #: [id number] Procedure:                Colonoscopy Indications:              High risk colon cancer surveillance: Personal                            history of colon cancer Medicines:                Monitored Anesthesia Care Procedure:                Pre-Anesthesia Assessment:                           - Prior to the procedure, a History and Physical                            was performed, and patient medications and                            allergies were reviewed. The patient's tolerance of                            previous anesthesia was also reviewed. The risks                            and benefits of the procedure and the sedation                            options and risks were discussed with the patient.                            All questions were answered, and informed consent                            was obtained. Prior Anticoagulants: The patient has                            taken no previous anticoagulant or antiplatelet                            agents. ASA Grade Assessment: III - A patient with                            severe systemic disease. After reviewing the risks                            and benefits, the patient was deemed in                            satisfactory condition to undergo the procedure.  After obtaining informed consent, the colonoscope                            was passed under direct vision. Throughout the                            procedure, the patient's blood pressure, pulse, and                            oxygen saturations were monitored continuously. The                            Colonoscope was introduced through the anus and                            advanced to the the cecum, identified  by                            appendiceal orifice and ileocecal valve. The                            ileocecal valve, appendiceal orifice, and rectum                            were photographed. The quality of the bowel                            preparation was inadequate despite enema in unit                            and extensive lavage and suction. The colonoscopy                            was performed without difficulty. The patient                            tolerated the procedure well. Scope In: 11:56:53 AM Scope Out: 12:14:45 PM Scope Withdrawal Time: 0 hours 15 minutes 47 seconds  Total Procedure Duration: 0 hours 17 minutes 52 seconds  Findings:                 The perianal and digital rectal examinations were                            normal.                           Two sessile polyps were found in the transverse                            colon and cecum. The polyps were 6 to 7 mm in size.                            These polyps were removed with a cold snare.  Resection and retrieval were complete.                           Three sessile polyps were found in the transverse                            colon. The polyps were 9 to 12 mm in size. These                            polyps were removed with a hot snare. Resection and                            retrieval were complete.                           There was evidence of a prior end-to-side                            colo-colonic anastomosis in the descending colon.                            This was patent and was characterized by healthy                            appearing mucosa. The anastomosis was traversed.                           A patchy area of mildly erythematous mucosa was                            found in the rectum and in the sigmoid colon.                            Biopsies were taken with a cold forceps for                            histology.                            The exam was otherwise without abnormality on                            direct and retroflexion views. Complications:            No immediate complications. Estimated blood loss:                            None. Estimated Blood Loss:     Estimated blood loss: none. Impression:               - Preparation of the colon was inadequate.                           - Two 6 to 7 mm polyps in the transverse colon and  in the cecum, removed with a cold snare. Resected                            and retrieved.                           - Three 9 to 12 mm polyps in the transverse colon,                            removed with a hot snare. Resected and retrieved.                           - Patent end-to-side colo-colonic anastomosis,                            characterized by healthy appearing mucosa.                           - Erythematous mucosa in the rectum and in the                            sigmoid colon. Biopsied.                           - The examination was otherwise normal on direct                            and retroflexion views. Recommendation:           - Repeat colonoscopy within 3 months for                            surveillance based on pathology results with 2 day                            extended bowel prep.                           - Patient has a contact number available for                            emergencies. The signs and symptoms of potential                            delayed complications were discussed with the                            patient. Return to normal activities tomorrow.                            Written discharge instructions were provided to the                            patient.                           -  Resume previous diet.                           - Continue present medications.                           - Await pathology results.                           - No aspirin, ibuprofen, naproxen, or other                             non-steroidal anti-inflammatory drugs for 2 weeks                            after polyp removal. Ladene Artist, MD 01/25/2020 12:20:43 PM This report has been signed electronically.

## 2020-01-27 ENCOUNTER — Telehealth: Payer: Self-pay

## 2020-01-27 NOTE — Telephone Encounter (Signed)
Called [phone] and left a message we tried to reach pt for a follow up call. maw

## 2020-01-27 NOTE — Telephone Encounter (Signed)
  Follow up Call-  Call back number 01/25/2020 07/21/2017  Post procedure Call Back phone  # [phone]  Permission to leave phone message Yes Yes  Some recent data might be hidden     Patient questions:  Do you have a fever, pain , or abdominal swelling? No. Pain Score  0 *  Have you tolerated food without any problems? Yes.    Have you been able to return to your normal activities? Yes.    Do you have any questions about your discharge instructions: Diet   No. Medications  No. Follow up visit  No.  Do you have questions or concerns about your Care? No.  Actions: * If pain score is 4 or above: No action needed, pain <4.  1. Have you developed a fever since your procedure? no  2.   Have you had an respiratory symptoms (SOB or cough) since your procedure? no  3.   Have you tested positive for COVID 19 since your procedure no  4.   Have you had any family members/close contacts diagnosed with the COVID 19 since your procedure?  no   If yes to any of these questions please route to Joylene John, RN and Joella Prince, RN

## 2020-01-30 DIAGNOSIS — Z23 Encounter for immunization: Secondary | ICD-10-CM | POA: Diagnosis not present

## 2020-02-03 ENCOUNTER — Encounter: Payer: Self-pay | Admitting: Gastroenterology

## 2020-03-27 DIAGNOSIS — Z23 Encounter for immunization: Secondary | ICD-10-CM | POA: Diagnosis not present

## 2020-04-18 DIAGNOSIS — C187 Malignant neoplasm of sigmoid colon: Secondary | ICD-10-CM | POA: Diagnosis not present

## 2020-04-18 DIAGNOSIS — I129 Hypertensive chronic kidney disease with stage 1 through stage 4 chronic kidney disease, or unspecified chronic kidney disease: Secondary | ICD-10-CM | POA: Diagnosis not present

## 2020-04-18 DIAGNOSIS — E785 Hyperlipidemia, unspecified: Secondary | ICD-10-CM | POA: Diagnosis not present

## 2020-04-18 DIAGNOSIS — N2581 Secondary hyperparathyroidism of renal origin: Secondary | ICD-10-CM | POA: Diagnosis not present

## 2020-04-18 DIAGNOSIS — N184 Chronic kidney disease, stage 4 (severe): Secondary | ICD-10-CM | POA: Diagnosis not present

## 2020-04-18 DIAGNOSIS — E669 Obesity, unspecified: Secondary | ICD-10-CM | POA: Diagnosis not present

## 2020-04-18 DIAGNOSIS — N390 Urinary tract infection, site not specified: Secondary | ICD-10-CM | POA: Diagnosis not present

## 2020-04-19 DIAGNOSIS — Z23 Encounter for immunization: Secondary | ICD-10-CM | POA: Diagnosis not present

## 2020-05-07 ENCOUNTER — Telehealth: Payer: Self-pay | Admitting: Oncology

## 2020-05-07 DIAGNOSIS — I10 Essential (primary) hypertension: Secondary | ICD-10-CM | POA: Diagnosis not present

## 2020-05-07 DIAGNOSIS — N184 Chronic kidney disease, stage 4 (severe): Secondary | ICD-10-CM | POA: Diagnosis not present

## 2020-05-07 NOTE — Telephone Encounter (Signed)
Rescheduled appointments on 1/28 per provider PAL request. Spoke to patient who is aware of updated appointments date and times.

## 2020-06-01 ENCOUNTER — Other Ambulatory Visit: Payer: Medicare Other

## 2020-06-01 ENCOUNTER — Ambulatory Visit: Payer: Medicare Other | Admitting: Oncology

## 2020-06-11 ENCOUNTER — Inpatient Hospital Stay (HOSPITAL_BASED_OUTPATIENT_CLINIC_OR_DEPARTMENT_OTHER): Payer: Medicare Other | Admitting: Oncology

## 2020-06-11 ENCOUNTER — Inpatient Hospital Stay: Payer: Medicare Other | Attending: Oncology

## 2020-06-11 ENCOUNTER — Other Ambulatory Visit: Payer: Self-pay

## 2020-06-11 ENCOUNTER — Telehealth: Payer: Self-pay | Admitting: Oncology

## 2020-06-11 VITALS — BP 146/66 | HR 65 | Temp 98.2°F | Resp 18 | Ht 76.0 in | Wt 248.2 lb

## 2020-06-11 DIAGNOSIS — Z85038 Personal history of other malignant neoplasm of large intestine: Secondary | ICD-10-CM | POA: Insufficient documentation

## 2020-06-11 DIAGNOSIS — C189 Malignant neoplasm of colon, unspecified: Secondary | ICD-10-CM

## 2020-06-11 DIAGNOSIS — Z9221 Personal history of antineoplastic chemotherapy: Secondary | ICD-10-CM | POA: Diagnosis not present

## 2020-06-11 LAB — CEA (IN HOUSE-CHCC): CEA (CHCC-In House): 1.34 ng/mL (ref 0.00–5.00)

## 2020-06-11 NOTE — Progress Notes (Signed)
[facility] OFFICE PROGRESS NOTE   Diagnosis: Colon cancer  INTERVAL HISTORY:   Louis. Delorenzo returns as scheduled.  He feels well.  Good appetite.  He is working.  He underwent a colonoscopy 01/25/2020.  Polyps were removed from the transverse colon and cecum.  A repeat colonoscopy was recommended in 3 months due to a poor preparation.  He declined the repeat colonoscopy. The pathology revealed tubular adenomas with no high-grade dysplasia or malignancy.  Objective:  Vital signs in last 24 hours:  Blood pressure (!) 146/66, pulse 65, temperature 98.2 F (36.8 C), temperature source Tympanic, resp. rate 18, height _0  (1.93 m), weight 248 lb 3.2 oz (112.6 kg), SpO2 99 %.    Lymphatics: No cervical, supraclavicular, axillary, or inguinal nodes Resp: Lungs clear bilaterally Cardio: Regular rate and rhythm GI: No mass, nontender, no hepatosplenomegaly Vascular: No leg edema, the left lower leg is slightly larger than the right side     Lab Results:  Lab Results  Component Value Date   WBC 5.1 09/20/2019   HGB 13.5 09/20/2019   HCT 41.7 09/20/2019   MCV 93.3 09/20/2019   PLT 164 09/20/2019   NEUTROABS 3.0 01/07/2018    CMP  Lab Results  Component Value Date   NA 140 09/20/2019   K 4.4 09/20/2019   CL 110 09/20/2019   CO2 21 (L) 09/20/2019   GLUCOSE 101 (H) 09/20/2019   BUN 37 (H) 09/20/2019   CREATININE 2.56 (H) 09/20/2019   CALCIUM 8.9 09/20/2019   PROT 7.0 01/08/2017   ALBUMIN 3.9 01/08/2017   AST 20 01/08/2017   ALT 20 01/08/2017   ALKPHOS 65 01/08/2017   BILITOT 0.6 01/08/2017   GFRNONAA 24 (L) 09/20/2019   GFRAA 28 (L) 09/20/2019    Lab Results  Component Value Date   CEA1 1.23 11/21/2019     Medications: I have reviewed the patient's current medications.   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 Sigmoid/descending resection and creation of a  descending colostomy 04/10/2016 ? MSI-stable, no loss of mismatch repair protein expression ? Foundation 1-BRAF V600Epositive, MS-stable, intermediate tumor mutation burden, no RAS mutation ? CT abdomen/pelvis 04/02/2016-no evidence of distant metastatic disease ? Cycle 1 FOLFOX 05/21/2016 ? Cycle 2 FOLFOX 06/04/2016 ? Cycle 3 FOLFOX 06/18/2016 ? Cycle 4 FOLFOX 07/02/2016 (oxaliplatin held secondary to thrombocytopenia) ? Cycle 5 FOLFOX 07/16/2016 ? Cycle 6 FOLFOX 07/30/2016 ? Cycle 7 FOLFOX 08/13/2016 (oxaliplatin held and 5-FU dose reduced) ? Cycle 8 FOLFOX 09/03/2016 (oxaliplatin held secondary to neuropathy) ? Cycle 9 FOLFOX 09/17/2016 (oxaliplatin held secondary to neuropathy) ? Cycle 10 FOLFOX 10/02/2016 ? Cycle 11 FOLFOX 10/15/2016 (oxaliplatin eliminated from the regimen) ? Cycle 12 FOLFOX 10/30/2016 (oxaliplatin eliminated) ? CTs 05/12/2017-no evidence of recurrent disease, right inguinal hernia containing bladder ? Colonoscopy 07/21/2017, 3 polyps were removed from the descending and transverse colon, fragments of tubular and tubulovillous adenoma ? CTs 05/19/2018-no evidence of recurrent disease, status post hernia repair, right upper lobe pneumonia ? CTs 05/20/2019-resolution of right upper lobe pneumonia, no evidence of metastatic disease ? Colonoscopy 01/25/2020-multiple polyps removed-tubular adenomas, poor preparation, repeat colonoscopy recommended  2. Chronic renal insufficiency  3. Hypertension  4. Inflamed sebaceous cyst at the upper back-status post incision and drainage  5. Thrombocytopenia secondary chemotherapy-oxaliplatin dose reduced beginning with cycle 3 FOLFOX, oxaliplatin held with cycle 4 and cycle 7 FOLFOX  6. Oxaliplatin neuropathy-improved  7. History ofMucositis secondary chemotherapy  8.  Symptoms of  pneumonia January 2020-CT 05/19/2018 consistent with right upper lobe pneumonia, Levaquin prescribed 05/20/2018  9.  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  We will follow up on the CEA from today.  He will return for an office visit and CEA in 6 months.  He says that he will follow up with Dr. Fuller Plan for a repeat colonoscopy this summer.  Betsy Coder, MD  06/11/2020  11:42 AM

## 2020-06-11 NOTE — Telephone Encounter (Signed)
Scheduled appointments per 2/7 los. Spoke to patient who is aware of appointments date and times.

## 2020-06-12 ENCOUNTER — Encounter: Payer: Self-pay | Admitting: Gastroenterology

## 2020-08-01 DIAGNOSIS — N184 Chronic kidney disease, stage 4 (severe): Secondary | ICD-10-CM | POA: Diagnosis not present

## 2020-08-08 DIAGNOSIS — E785 Hyperlipidemia, unspecified: Secondary | ICD-10-CM | POA: Diagnosis not present

## 2020-08-08 DIAGNOSIS — I129 Hypertensive chronic kidney disease with stage 1 through stage 4 chronic kidney disease, or unspecified chronic kidney disease: Secondary | ICD-10-CM | POA: Diagnosis not present

## 2020-08-08 DIAGNOSIS — E669 Obesity, unspecified: Secondary | ICD-10-CM | POA: Diagnosis not present

## 2020-08-08 DIAGNOSIS — C187 Malignant neoplasm of sigmoid colon: Secondary | ICD-10-CM | POA: Diagnosis not present

## 2020-08-08 DIAGNOSIS — N179 Acute kidney failure, unspecified: Secondary | ICD-10-CM | POA: Diagnosis not present

## 2020-08-08 DIAGNOSIS — N1832 Chronic kidney disease, stage 3b: Secondary | ICD-10-CM | POA: Diagnosis not present

## 2020-08-08 DIAGNOSIS — N2581 Secondary hyperparathyroidism of renal origin: Secondary | ICD-10-CM | POA: Diagnosis not present

## 2020-08-14 DIAGNOSIS — Z23 Encounter for immunization: Secondary | ICD-10-CM | POA: Diagnosis not present

## 2020-08-23 ENCOUNTER — Other Ambulatory Visit: Payer: Self-pay

## 2020-08-23 ENCOUNTER — Inpatient Hospital Stay: Payer: Medicare Other | Attending: Oncology | Admitting: Oncology

## 2020-08-23 ENCOUNTER — Telehealth: Payer: Self-pay

## 2020-08-23 VITALS — BP 173/75 | HR 80 | Temp 97.8°F | Resp 18 | Ht 76.0 in | Wt 252.0 lb

## 2020-08-23 DIAGNOSIS — C187 Malignant neoplasm of sigmoid colon: Secondary | ICD-10-CM | POA: Diagnosis not present

## 2020-08-23 DIAGNOSIS — I129 Hypertensive chronic kidney disease with stage 1 through stage 4 chronic kidney disease, or unspecified chronic kidney disease: Secondary | ICD-10-CM | POA: Insufficient documentation

## 2020-08-23 DIAGNOSIS — C189 Malignant neoplasm of colon, unspecified: Secondary | ICD-10-CM | POA: Diagnosis not present

## 2020-08-23 DIAGNOSIS — N189 Chronic kidney disease, unspecified: Secondary | ICD-10-CM | POA: Insufficient documentation

## 2020-08-23 DIAGNOSIS — Z79899 Other long term (current) drug therapy: Secondary | ICD-10-CM | POA: Insufficient documentation

## 2020-08-23 DIAGNOSIS — D6959 Other secondary thrombocytopenia: Secondary | ICD-10-CM | POA: Diagnosis not present

## 2020-08-23 DIAGNOSIS — L723 Sebaceous cyst: Secondary | ICD-10-CM | POA: Diagnosis not present

## 2020-08-23 DIAGNOSIS — T451X5A Adverse effect of antineoplastic and immunosuppressive drugs, initial encounter: Secondary | ICD-10-CM | POA: Insufficient documentation

## 2020-08-23 DIAGNOSIS — R590 Localized enlarged lymph nodes: Secondary | ICD-10-CM | POA: Insufficient documentation

## 2020-08-23 NOTE — Telephone Encounter (Signed)
TC from Pt stating he discovered a lump under his armpit. Pt requested an appointment with Dr Benay Spice. Pt will be seen today at 330.

## 2020-08-23 NOTE — Progress Notes (Signed)
[place] OFFICE PROGRESS NOTE   Diagnosis: Colon cancer  INTERVAL HISTORY:   Louis [name] returns prior to scheduled visit.  He has noted a lump under the left arm since earlier this week.  No pain.  He otherwise feels well.  Objective:  Vital signs in last 24 hours:  Blood pressure (!) 173/75, pulse 80, temperature 97.8 F (36.6 C), resp. rate 18, height '6\' 4"'  (1.93 m), weight 252 lb (114.3 kg), SpO2 99 %.    Lymphatics: No cervical, supraclavicular, right axillary, or inguinal nodes.  There is a 3 cm oval mass in the high left axilla.  No tenderness. Resp: Lungs clear bilaterally Cardio: Regular rate and rhythm GI: No mass, nontender, no hepatosplenomegaly Vascular: No leg edema Breast: Left breast without mass   Lab Results:  Lab Results  Component Value Date   WBC 5.1 09/20/2019   HGB 13.5 09/20/2019   HCT 41.7 09/20/2019   MCV 93.3 09/20/2019   PLT 164 09/20/2019   NEUTROABS 3.0 01/07/2018    CMP  Lab Results  Component Value Date   NA 140 09/20/2019   K 4.4 09/20/2019   CL 110 09/20/2019   CO2 21 (L) 09/20/2019   GLUCOSE 101 (H) 09/20/2019   BUN 37 (H) 09/20/2019   CREATININE 2.56 (H) 09/20/2019   CALCIUM 8.9 09/20/2019   PROT 7.0 01/08/2017   ALBUMIN 3.9 01/08/2017   AST 20 01/08/2017   ALT 20 01/08/2017   ALKPHOS 65 01/08/2017   BILITOT 0.6 01/08/2017   GFRNONAA 24 (L) 09/20/2019   GFRAA 28 (L) 09/20/2019    Lab Results  Component Value Date   CEA1 1.34 06/11/2020    Medications: I have reviewed the patient's current medications.   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 Sigmoid/descending resection and creation of a descending colostomy 04/10/2016 ? MSI-stable, no loss of mismatch repair protein expression ? Foundation 1-BRAF V600Epositive, MS-stable, intermediate tumor mutation burden, no RAS mutation ? CT abdomen/pelvis  04/02/2016-no evidence of distant metastatic disease ? Cycle 1 FOLFOX 05/21/2016 ? Cycle 2 FOLFOX 06/04/2016 ? Cycle 3 FOLFOX 06/18/2016 ? Cycle 4 FOLFOX 07/02/2016 (oxaliplatin held secondary to thrombocytopenia) ? Cycle 5 FOLFOX 07/16/2016 ? Cycle 6 FOLFOX 07/30/2016 ? Cycle 7 FOLFOX 08/13/2016 (oxaliplatin held and 5-FU dose reduced) ? Cycle 8 FOLFOX 09/03/2016 (oxaliplatin held secondary to neuropathy) ? Cycle 9 FOLFOX 09/17/2016 (oxaliplatin held secondary to neuropathy) ? Cycle 10 FOLFOX 10/02/2016 ? Cycle 11 FOLFOX 10/15/2016 (oxaliplatin eliminated from the regimen) ? Cycle 12 FOLFOX 10/30/2016 (oxaliplatin eliminated) ? CTs 05/12/2017-no evidence of recurrent disease, right inguinal hernia containing bladder ? Colonoscopy 07/21/2017, 3 polyps were removed from the descending and transverse colon, fragments of tubular and tubulovillous adenoma ? CTs 05/19/2018-no evidence of recurrent disease, status post hernia repair, right upper lobe pneumonia ? CTs 05/20/2019-resolution of right upper lobe pneumonia, no evidence of metastatic disease ? Colonoscopy 01/25/2020-multiple polyps removed-tubular adenomas, poor preparation, repeat colonoscopy recommended  2. Chronic renal insufficiency  3. Hypertension  4. Inflamed sebaceous cyst at the upper back-status post incision and drainage  5. Thrombocytopenia secondary chemotherapy-oxaliplatin dose reduced beginning with cycle 3 FOLFOX, oxaliplatin held with cycle 4 and cycle 7 FOLFOX  6. Oxaliplatin neuropathy-improved  7. History ofMucositis secondary chemotherapy  8.  Symptoms of pneumonia January 2020-CT 05/19/2018 consistent with right upper lobe pneumonia, Levaquin prescribed 05/20/2018  9.  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  He presents today with a new mass in the left axilla.  He  appears to have an enlarged left axillary node.  I discussed the differential diagnosis with Louis. [name].  He understands the lymph node could be related to metastatic colon cancer, lymphoma, or another malignancy.  He will be scheduled for CTs of the neck, chest, abdomen, and pelvis prior to an office visit on 08/29/2020.  Betsy Coder, MD  08/23/2020  4:00 PM

## 2020-08-27 ENCOUNTER — Other Ambulatory Visit: Payer: Self-pay

## 2020-08-27 ENCOUNTER — Ambulatory Visit (HOSPITAL_BASED_OUTPATIENT_CLINIC_OR_DEPARTMENT_OTHER): Admission: RE | Admit: 2020-08-27 | Payer: Medicare Other | Source: Ambulatory Visit

## 2020-08-27 ENCOUNTER — Ambulatory Visit (HOSPITAL_BASED_OUTPATIENT_CLINIC_OR_DEPARTMENT_OTHER)
Admission: RE | Admit: 2020-08-27 | Discharge: 2020-08-27 | Disposition: A | Discharge status: Home / Self Care | Payer: Medicare Other | Source: Ambulatory Visit | Attending: Oncology | Admitting: Oncology

## 2020-08-27 DIAGNOSIS — I719 Aortic aneurysm of unspecified site, without rupture: Secondary | ICD-10-CM | POA: Diagnosis not present

## 2020-08-27 DIAGNOSIS — C189 Malignant neoplasm of colon, unspecified: Secondary | ICD-10-CM | POA: Diagnosis not present

## 2020-08-27 DIAGNOSIS — K5900 Constipation, unspecified: Secondary | ICD-10-CM | POA: Diagnosis not present

## 2020-08-27 DIAGNOSIS — K8020 Calculus of gallbladder without cholecystitis without obstruction: Secondary | ICD-10-CM | POA: Diagnosis not present

## 2020-08-27 DIAGNOSIS — K110 Atrophy of salivary gland: Secondary | ICD-10-CM | POA: Diagnosis not present

## 2020-08-29 ENCOUNTER — Ambulatory Visit: Payer: Medicare Other | Admitting: Nurse Practitioner

## 2020-08-30 ENCOUNTER — Other Ambulatory Visit: Payer: Self-pay

## 2020-08-30 ENCOUNTER — Inpatient Hospital Stay (HOSPITAL_BASED_OUTPATIENT_CLINIC_OR_DEPARTMENT_OTHER): Payer: Medicare Other | Admitting: Oncology

## 2020-08-30 VITALS — BP 157/80 | HR 71 | Temp 98.2°F | Resp 18 | Ht 76.0 in | Wt 248.6 lb

## 2020-08-30 DIAGNOSIS — D6959 Other secondary thrombocytopenia: Secondary | ICD-10-CM | POA: Diagnosis not present

## 2020-08-30 DIAGNOSIS — T451X5A Adverse effect of antineoplastic and immunosuppressive drugs, initial encounter: Secondary | ICD-10-CM | POA: Diagnosis not present

## 2020-08-30 DIAGNOSIS — N189 Chronic kidney disease, unspecified: Secondary | ICD-10-CM | POA: Diagnosis not present

## 2020-08-30 DIAGNOSIS — C187 Malignant neoplasm of sigmoid colon: Secondary | ICD-10-CM | POA: Diagnosis not present

## 2020-08-30 DIAGNOSIS — C189 Malignant neoplasm of colon, unspecified: Secondary | ICD-10-CM | POA: Diagnosis not present

## 2020-08-30 DIAGNOSIS — I129 Hypertensive chronic kidney disease with stage 1 through stage 4 chronic kidney disease, or unspecified chronic kidney disease: Secondary | ICD-10-CM | POA: Diagnosis not present

## 2020-08-30 DIAGNOSIS — L723 Sebaceous cyst: Secondary | ICD-10-CM | POA: Diagnosis not present

## 2020-08-30 NOTE — Progress Notes (Signed)
[facility] OFFICE PROGRESS NOTE   Diagnosis: Colon cancer  INTERVAL HISTORY:   Louis [name] returns as scheduled.  No new complaint.  He feels the left axillary lymph node is smaller.  He reports he had a COVID-19 vaccine on 08/14/2020.  This was approximately 1 week prior to noticing the enlarged left axillary lymph node.  Objective:  Vital signs in last 24 hours:  Blood pressure (!) 157/80, pulse 71, temperature 98.2 F (36.8 C), temperature source Oral, resp. rate 18, height '6\' 4"'  (1.93 m), weight 248 lb 9.6 oz (112.8 kg), SpO2 98 %.    Lymphatics: 2 cm left axillary node, subjectively smaller compared to 08/23/2020, no cervical, supraclavicular, right axillary, or right inguinal nodes.  2 cm left inguinal node   Lab Results:  Lab Results  Component Value Date   WBC 5.1 09/20/2019   HGB 13.5 09/20/2019   HCT 41.7 09/20/2019   MCV 93.3 09/20/2019   PLT 164 09/20/2019   NEUTROABS 3.0 01/07/2018    CMP  Lab Results  Component Value Date   NA 140 09/20/2019   K 4.4 09/20/2019   CL 110 09/20/2019   CO2 21 (L) 09/20/2019   GLUCOSE 101 (H) 09/20/2019   BUN 37 (H) 09/20/2019   CREATININE 2.56 (H) 09/20/2019   CALCIUM 8.9 09/20/2019   PROT 7.0 01/08/2017   ALBUMIN 3.9 01/08/2017   AST 20 01/08/2017   ALT 20 01/08/2017   ALKPHOS 65 01/08/2017   BILITOT 0.6 01/08/2017   GFRNONAA 24 (L) 09/20/2019   GFRAA 28 (L) 09/20/2019    Lab Results  Component Value Date   CEA1 1.34 06/11/2020    Lab Results  Component Value Date   INR 0.97 11/11/2016    Imaging:  CT ABDOMEN PELVIS WO CONTRAST  Result Date: 08/28/2020 CLINICAL DATA:  Colon cancer restaging, diagnosis in December 2017 with resection, chemotherapy completed in 2018, prior colostomy reversal. New axillary lymph nodes, side not specified. Also history of COVID booster vaccination, side not specified. EXAM: CT CHEST, ABDOMEN AND PELVIS WITHOUT CONTRAST TECHNIQUE: Multidetector CT imaging of the  chest, abdomen and pelvis was performed following the standard protocol without IV contrast. COMPARISON:  Multiple exams, including 05/20/2019 FINDINGS: CT CHEST FINDINGS Cardiovascular: Coronary, aortic arch, and branch vessel atherosclerotic vascular disease. 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 Mild aortic valve calcification. Prominent main pulmonary artery similar to prior, favoring pulmonary arterial hypertension. Mediastinum/Nodes: Newly enlarged left axillary lymph node 2.0 cm in diameter on image 31 series 3, there is previously a 0.5 cm axillary lymph node in this vicinity on 05/20/2019 that had a parenchymal thickness of only 2 mm and a fatty hilum. Faint stranding along the subcutaneous tissues adjacent to the lymph node observed, which is not entirely specific but which may favor an inflammatory/reactive etiology. Bilateral gynecomastia without appreciable breast mass. Lungs/Pleura: Unremarkable Musculoskeletal: Degenerative glenohumeral arthropathy bilaterally. Thoracic spondylosis. CT ABDOMEN PELVIS FINDINGS Hepatobiliary: Is 0.5 cm hypodense lesion in the dome of the right hepatic lobe on image 49 series 3 has been stable on multiple prior exams and is most likely benign. Tiny dependent gallstone in the gallbladder, image 75 series 3. Pancreas: Unremarkable Spleen: Unremarkable Adrenals/Urinary Tract: Mild to moderate bilateral renal atrophy. Hypodense bilateral renal lesions are likely cysts although enhancement characteristics are not interrogated. Bilateral renal sinus lipomatosis. Large volume and somewhat flaccid appearing urinary bladder. Stomach/Bowel: Anastomotic staple line along the proximal descending colon. Prominent stool throughout the colon favors constipation. Vascular/Lymphatic: Aortoiliac  atherosclerotic vascular disease. Portacaval lymph node 1.7 cm in short axis on image 68 series 3, previously the same on 05/20/2019 and previously 1.6 cm on 04/02/2016. Newly  enlarged left inguinal lymph node 1.9 cm in short axis on image 124 of series 3. Reproductive: Prostatomegaly, the prostate gland indents the bladder base as before. Other: No supplemental non-categorized findings. Musculoskeletal: Lumbar spondylosis and degenerative disc disease with impingement at L3-4 and L4-5. IMPRESSION: 1. Newly enlarged left axillary lymph node at 2.0 cm in diameter, and newly enlarged left inguinal lymph node at 1.9 cm in diameter. Mildly prominent portacaval lymph node appears chronic. While the left axillary nodal prominence could potentially be attributed to a vaccine booster if administered in the left upper extremity over the last 12 weeks, the left inguinal adenopathy seems very unlikely to be caused by an upper extremity booster shot. While reactive etiologies for both lymph nodes cannot be readily excluded, particularly given the faint stranding around the axillary node, neoplastic etiology is likewise difficult to exclude. Potential workup might include left inguinal nodal biopsy or follow up physical exam in sonography in the short term in order to assess for resolution. 2. Stable ascending aortic aneurysm at 4.1 cm in diameter. Recommend annual imaging followup by CTA or MRA. This recommendation follows 2010 ACCF/AHA/AATS/ACR/ASA/SCA/SCAI/SIR/STS/SVM Guidelines for the Diagnosis and Management of Patients with Thoracic Aortic Disease. Circulation. 2010; 121: A630-Z601. Aortic aneurysm NOS (ICD10-I71.9) 3. Other imaging findings of potential clinical significance: Aortic Atherosclerosis (ICD10-I70.0). Coronary atherosclerosis. Cholelithiasis. Prominent stool throughout the colon favors constipation. Prostatomegaly. Lumbar impingement at L3-4 and L4-5. Continued pulmonary arterial prominence potentially reflecting pulmonary arterial hypertension. Electronically Signed   By: Van Clines M.D.   On: 08/28/2020 11:10   CT Soft Tissue Neck Wo Contrast  Result Date:  08/28/2020 CLINICAL DATA:  History of colon cancer.  New axillary mass EXAM: CT NECK WITHOUT CONTRAST TECHNIQUE: Multidetector CT imaging of the neck was performed following the standard protocol without intravenous contrast. COMPARISON:  None. FINDINGS: Pharynx and larynx: No evidence of mass or swelling Salivary glands: Asymmetric fatty atrophy of the right parotid gland. No mass or evidence of acute inflammation Thyroid: Negative Lymph nodes: No adenopathy seen in the neck. Coarse calcification in the right level 2 neck may be from old granulomatous disease. Vascular: Limited without contrast.  Atheromatous calcification Limited intracranial: Cerebellar atrophy. Visualized orbits: Negative Mastoids and visualized paranasal sinuses: Clear Skeleton: No acute or focal finding. Generalized cervical disc and facet degeneration with C3-4 anterolisthesis. Upper chest: Reported separately IMPRESSION: 1. No adenopathy in the neck or supraclavicular fossae. 2. Chest CT reported separately. Electronically Signed   By: Monte Fantasia M.D.   On: 08/28/2020 07:36   CT CHEST WO CONTRAST  Result Date: 08/28/2020 CLINICAL DATA:  Colon cancer restaging, diagnosis in December 2017 with resection, chemotherapy completed in 2018, prior colostomy reversal. New axillary lymph nodes, side not specified. Also history of COVID booster vaccination, side not specified. EXAM: CT CHEST, ABDOMEN AND PELVIS WITHOUT CONTRAST TECHNIQUE: Multidetector CT imaging of the chest, abdomen and pelvis was performed following the standard protocol without IV contrast. COMPARISON:  Multiple exams, including 05/20/2019 FINDINGS: CT CHEST FINDINGS Cardiovascular: Coronary, aortic arch, and branch vessel atherosclerotic vascular disease. 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 Mild aortic valve calcification. Prominent main pulmonary artery similar to prior, favoring pulmonary arterial hypertension. Mediastinum/Nodes: Newly enlarged left  axillary lymph node 2.0 cm in diameter on image 31 series 3, there is previously a 0.5 cm axillary  lymph node in this vicinity on 05/20/2019 that had a parenchymal thickness of only 2 mm and a fatty hilum. Faint stranding along the subcutaneous tissues adjacent to the lymph node observed, which is not entirely specific but which may favor an inflammatory/reactive etiology. Bilateral gynecomastia without appreciable breast mass. Lungs/Pleura: Unremarkable Musculoskeletal: Degenerative glenohumeral arthropathy bilaterally. Thoracic spondylosis. CT ABDOMEN PELVIS FINDINGS Hepatobiliary: Is 0.5 cm hypodense lesion in the dome of the right hepatic lobe on image 49 series 3 has been stable on multiple prior exams and is most likely benign. Tiny dependent gallstone in the gallbladder, image 75 series 3. Pancreas: Unremarkable Spleen: Unremarkable Adrenals/Urinary Tract: Mild to moderate bilateral renal atrophy. Hypodense bilateral renal lesions are likely cysts although enhancement characteristics are not interrogated. Bilateral renal sinus lipomatosis. Large volume and somewhat flaccid appearing urinary bladder. Stomach/Bowel: Anastomotic staple line along the proximal descending colon. Prominent stool throughout the colon favors constipation. Vascular/Lymphatic: Aortoiliac atherosclerotic vascular disease. Portacaval lymph node 1.7 cm in short axis on image 68 series 3, previously the same on 05/20/2019 and previously 1.6 cm on 04/02/2016. Newly enlarged left inguinal lymph node 1.9 cm in short axis on image 124 of series 3. Reproductive: Prostatomegaly, the prostate gland indents the bladder base as before. Other: No supplemental non-categorized findings. Musculoskeletal: Lumbar spondylosis and degenerative disc disease with impingement at L3-4 and L4-5. IMPRESSION: 1. Newly enlarged left axillary lymph node at 2.0 cm in diameter, and newly enlarged left inguinal lymph node at 1.9 cm in diameter. Mildly prominent  portacaval lymph node appears chronic. While the left axillary nodal prominence could potentially be attributed to a vaccine booster if administered in the left upper extremity over the last 12 weeks, the left inguinal adenopathy seems very unlikely to be caused by an upper extremity booster shot. While reactive etiologies for both lymph nodes cannot be readily excluded, particularly given the faint stranding around the axillary node, neoplastic etiology is likewise difficult to exclude. Potential workup might include left inguinal nodal biopsy or follow up physical exam in sonography in the short term in order to assess for resolution. 2. Stable ascending aortic aneurysm at 4.1 cm in diameter. Recommend annual imaging followup by CTA or MRA. This recommendation follows 2010 ACCF/AHA/AATS/ACR/ASA/SCA/SCAI/SIR/STS/SVM Guidelines for the Diagnosis and Management of Patients with Thoracic Aortic Disease. Circulation. 2010; 121: Z610-R604. Aortic aneurysm NOS (ICD10-I71.9) 3. Other imaging findings of potential clinical significance: Aortic Atherosclerosis (ICD10-I70.0). Coronary atherosclerosis. Cholelithiasis. Prominent stool throughout the colon favors constipation. Prostatomegaly. Lumbar impingement at L3-4 and L4-5. Continued pulmonary arterial prominence potentially reflecting pulmonary arterial hypertension. Electronically Signed   By: Van Clines M.D.   On: 08/28/2020 11:10    Medications: I have reviewed the patient's current medications.   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 Sigmoid/descending resection and creation of a descending colostomy 04/10/2016 ? MSI-stable, no loss of mismatch repair protein expression ? Foundation 1-BRAF V600Epositive, MS-stable, intermediate tumor mutation burden, no RAS mutation ? CT abdomen/pelvis 04/02/2016-no evidence of distant metastatic disease ? Cycle 1 FOLFOX  05/21/2016 ? Cycle 2 FOLFOX 06/04/2016 ? Cycle 3 FOLFOX 06/18/2016 ? Cycle 4 FOLFOX 07/02/2016 (oxaliplatin held secondary to thrombocytopenia) ? Cycle 5 FOLFOX 07/16/2016 ? Cycle 6 FOLFOX 07/30/2016 ? Cycle 7 FOLFOX 08/13/2016 (oxaliplatin held and 5-FU dose reduced) ? Cycle 8 FOLFOX 09/03/2016 (oxaliplatin held secondary to neuropathy) ? Cycle 9 FOLFOX 09/17/2016 (oxaliplatin held secondary to neuropathy) ? Cycle 10 FOLFOX 10/02/2016 ? Cycle 11 FOLFOX 10/15/2016 (oxaliplatin eliminated from the  regimen) ? Cycle 12 FOLFOX 10/30/2016 (oxaliplatin eliminated) ? CTs 05/12/2017-no evidence of recurrent disease, right inguinal hernia containing bladder ? Colonoscopy 07/21/2017, 3 polyps were removed from the descending and transverse colon, fragments of tubular and tubulovillous adenoma ? CTs 05/19/2018-no evidence of recurrent disease, status post hernia repair, right upper lobe pneumonia ? CTs 05/20/2019-resolution of right upper lobe pneumonia, no evidence of metastatic disease ? Colonoscopy 01/25/2020-multiple polyps removed-tubular adenomas, poor preparation, repeat colonoscopy recommended ? CTs neck, chest, and abdomen/pelvis 08/27/2020- new to centimeter left axillary node, new 1.9 cm left inguinal node chronic mildly prominent portacaval node  2. Chronic renal insufficiency  3. Hypertension  4. Inflamed sebaceous cyst at the upper back-status post incision and drainage  5. Thrombocytopenia secondary chemotherapy-oxaliplatin dose reduced beginning with cycle 3 FOLFOX, oxaliplatin held with cycle 4 and cycle 7 FOLFOX  6. Oxaliplatin neuropathy-improved  7. History ofMucositis secondary chemotherapy  8.  Symptoms of pneumonia January 2020-CT 05/19/2018 consistent with right upper lobe pneumonia, Levaquin prescribed 05/20/2018  9.  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  The CTs  reveal new left axillary and left inguinal nodes compared to a CT from last year.  It is possible the left axillary node is related to the recent COVID-19 vaccine.  It is also possible the left inguinal node is related to the vaccine.  This would be an unusual pattern for recurrent colon cancer.  I discussed the differential diagnosis with Louis. Kinne and his wife.  We reviewed the CT images.  We decided to follow him with observation for now.  He will return for an office visit in 1 month.  He will contact me in the interim if the lymph nodes enlarge.  Betsy Coder, MD  08/30/2020  4:44 PM

## 2020-09-12 DIAGNOSIS — Z96652 Presence of left artificial knee joint: Secondary | ICD-10-CM | POA: Diagnosis not present

## 2020-09-28 ENCOUNTER — Inpatient Hospital Stay: Payer: Medicare Other | Attending: Oncology | Admitting: Oncology

## 2020-09-28 ENCOUNTER — Other Ambulatory Visit: Payer: Self-pay

## 2020-09-28 VITALS — BP 146/93 | HR 69 | Temp 98.2°F | Resp 20 | Ht 76.0 in | Wt 251.0 lb

## 2020-09-28 DIAGNOSIS — N189 Chronic kidney disease, unspecified: Secondary | ICD-10-CM | POA: Diagnosis not present

## 2020-09-28 DIAGNOSIS — C189 Malignant neoplasm of colon, unspecified: Secondary | ICD-10-CM

## 2020-09-28 DIAGNOSIS — Z85038 Personal history of other malignant neoplasm of large intestine: Secondary | ICD-10-CM | POA: Insufficient documentation

## 2020-09-28 DIAGNOSIS — I129 Hypertensive chronic kidney disease with stage 1 through stage 4 chronic kidney disease, or unspecified chronic kidney disease: Secondary | ICD-10-CM | POA: Insufficient documentation

## 2020-09-28 NOTE — Progress Notes (Signed)
[facility] OFFICE PROGRESS NOTE   Diagnosis: Colon cancer  INTERVAL HISTORY:    Louis  [name] returns as scheduled.  No change in the palpable left axillary lymph node.  No new complaint.  Objective:  Vital signs in last 24 hours:  Blood pressure (!) 146/93, pulse 69, temperature 98.2 F (36.8 C), temperature source Oral, resp. rate 20, height '6\' 4"'  (1.93 m), weight 251 lb (113.9 kg), SpO2 100 %.     Lymphatics: 2-3 cm mobile left axillary node, 3 cm firm left inguinal node, no cervical, supraclavicular, right axillary or right inguinal nodes GI: No hepatosplenomegaly, no mass Vascular: No leg edema  Lab Results:  Lab Results  Component Value Date   WBC 5.1 09/20/2019   HGB 13.5 09/20/2019   HCT 41.7 09/20/2019   MCV 93.3 09/20/2019   PLT 164 09/20/2019   NEUTROABS 3.0 01/07/2018    CMP  Lab Results  Component Value Date   NA 140 09/20/2019   K 4.4 09/20/2019   CL 110 09/20/2019   CO2 21 (L) 09/20/2019   GLUCOSE 101 (H) 09/20/2019   BUN 37 (H) 09/20/2019   CREATININE 2.56 (H) 09/20/2019   CALCIUM 8.9 09/20/2019   PROT 7.0 01/08/2017   ALBUMIN 3.9 01/08/2017   AST 20 01/08/2017   ALT 20 01/08/2017   ALKPHOS 65 01/08/2017   BILITOT 0.6 01/08/2017   GFRNONAA 24 (L) 09/20/2019   GFRAA 28 (L) 09/20/2019    Lab Results  Component Value Date   CEA1 1.34 06/11/2020     Medications: I have reviewed the patient's current medications.   Assessment/Plan: 1.Sigmoid colon cancer, stage IV (O3J,K0X,F8H), isolated mesenteric implant-resected, multiple tumor deposits ? Sigmoid/descending resection and creation of a descending colostomy 04/10/2016 ? MSI-stable, no loss of mismatch repair protein expression ? Foundation 1-BRAF V600Epositive, MS-stable, intermediate tumor mutation burden, no RAS mutation ? CT abdomen/pelvis 04/02/2016-no evidence of distant metastatic disease ? Cycle 1 FOLFOX 05/21/2016 ? Cycle 2 FOLFOX 06/04/2016 ? Cycle 3 FOLFOX  06/18/2016 ? Cycle 4 FOLFOX 07/02/2016 (oxaliplatin held secondary to thrombocytopenia) ? Cycle 5 FOLFOX 07/16/2016 ? Cycle 6 FOLFOX 07/30/2016 ? Cycle 7 FOLFOX 08/13/2016 (oxaliplatin held and 5-FU dose reduced) ? Cycle 8 FOLFOX 09/03/2016 (oxaliplatin held secondary to neuropathy) ? Cycle 9 FOLFOX 09/17/2016 (oxaliplatin held secondary to neuropathy) ? Cycle 10 FOLFOX 10/02/2016 ? Cycle 11 FOLFOX 10/15/2016 (oxaliplatin eliminated from the regimen) ? Cycle 12 FOLFOX 10/30/2016 (oxaliplatin eliminated) ? CTs 05/12/2017-no evidence of recurrent disease, right inguinal hernia containing bladder ? Colonoscopy 07/21/2017, 3 polyps were removed from the descending and transverse colon, fragments of tubular and tubulovillous adenoma ? CTs 05/19/2018-no evidence of recurrent disease, status post hernia repair, right upper lobe pneumonia ? CTs 05/20/2019-resolution of right upper lobe pneumonia, no evidence of metastatic disease ? Colonoscopy 01/25/2020-multiple polyps removed-tubular adenomas, poor preparation, repeat colonoscopy recommended ? CTs neck, chest, and abdomen/pelvis 08/27/2020- new to centimeter left axillary node, new 1.9 cm left inguinal node chronic mildly prominent portacaval node  2. Chronic renal insufficiency  3. Hypertension  4. Inflamed sebaceous cyst at the upper back-status post incision and drainage  5. Thrombocytopenia secondary chemotherapy-oxaliplatin dose reduced beginning with cycle 3 FOLFOX, oxaliplatin held with cycle 4 and cycle 7 FOLFOX  6. Oxaliplatin neuropathy-improved  7. History ofMucositis secondary chemotherapy  8.  Symptoms of pneumonia January 2020-CT 05/19/2018 consistent with right upper lobe pneumonia, Levaquin prescribed 05/20/2018  9.  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  Left total knee replacement  09/29/2019    Disposition:  Louis [name] has persistent left axillary and left inguinal lymphadenopathy.  The axillary node  could be related to the April COVID 19 vaccine, but I think it would be unusual to have left inguinal adenopathy related to the vaccine.  We discussed continued observation versus a biopsy.  He agrees to a biopsy of the left inguinal lymph node.  We will try to arrange the biopsy for within the next few weeks.  He will return for an office visit in approximately 3 weeks.  Betsy Coder, MD  09/28/2020  10:27 AM

## 2020-10-04 ENCOUNTER — Telehealth: Payer: Self-pay | Admitting: *Deleted

## 2020-10-04 ENCOUNTER — Encounter (HOSPITAL_COMMUNITY): Payer: Self-pay

## 2020-10-04 NOTE — Telephone Encounter (Signed)
Call to central scheduling to f/u on status of US biopsy lymph node requested for 10/12/20. Was in formed that as of 10/03/20 the case is in review by radiologist.

## 2020-10-04 NOTE — Progress Notes (Unsigned)
BB    1       Louis [name] "Louis [name]" Male, 75 y.o., 12-Sep-1945  MRN:  [id number] Phone:  [phone] Jerilynn Mages)       PCP:  Donato Heinz, MD Primary Cvg:  Medicare/Medicare Part A And B  Next Appt With Radiology ([facility]-US 2) 10/11/2020 at 1:00 PM           RE: Biopsy Received: Yesterday Suttle, Rosanne Ashing, MD  [facility]  Approved for ultrasound guided left inguinal lymph node biopsy.   Dylan        Previous Messages   ----- Message -----  From: Lenore Cordia  Sent: 10/03/2020  3:48 PM EDT  To: [facility] Procedure Requests  Subject: Biopsy                      Procedure Requested: US biopsy (lymph node)    Reason for Procedure: left inguinal lymph node, h/o colon cancer    Provider Requesting: Ladell Pier  Provider Telephone: [phone]    Other Info:

## 2020-10-10 ENCOUNTER — Other Ambulatory Visit: Payer: Self-pay | Admitting: Radiology

## 2020-10-10 ENCOUNTER — Telehealth: Payer: Self-pay

## 2020-10-10 NOTE — Telephone Encounter (Signed)
TC from Pt stating that he had a question about his biopsy appointment tomorrow. Pt was inquiring about what type of biopsy he was getting tomorrow. Informed Pt that the biopsy he was getting as US biopsy of the lymph node. Which is a needle biopsy. Pt verbalized understanding. No further problem or concerns noted.

## 2020-10-11 ENCOUNTER — Encounter (HOSPITAL_COMMUNITY): Payer: Self-pay

## 2020-10-11 ENCOUNTER — Other Ambulatory Visit: Payer: Self-pay | Admitting: Oncology

## 2020-10-11 ENCOUNTER — Ambulatory Visit (HOSPITAL_COMMUNITY)
Admission: RE | Admit: 2020-10-11 | Discharge: 2020-10-11 | Disposition: A | Discharge status: Home / Self Care | Payer: Medicare Other | Source: Ambulatory Visit | Attending: Oncology | Admitting: Oncology

## 2020-10-11 ENCOUNTER — Other Ambulatory Visit: Payer: Self-pay

## 2020-10-11 DIAGNOSIS — C189 Malignant neoplasm of colon, unspecified: Secondary | ICD-10-CM

## 2020-10-11 DIAGNOSIS — I700 Atherosclerosis of aorta: Secondary | ICD-10-CM | POA: Diagnosis not present

## 2020-10-11 DIAGNOSIS — R590 Localized enlarged lymph nodes: Secondary | ICD-10-CM | POA: Diagnosis not present

## 2020-10-11 DIAGNOSIS — Z79899 Other long term (current) drug therapy: Secondary | ICD-10-CM | POA: Diagnosis not present

## 2020-10-11 DIAGNOSIS — Z85038 Personal history of other malignant neoplasm of large intestine: Secondary | ICD-10-CM | POA: Diagnosis not present

## 2020-10-11 DIAGNOSIS — I719 Aortic aneurysm of unspecified site, without rupture: Secondary | ICD-10-CM | POA: Diagnosis not present

## 2020-10-11 LAB — CBC WITH DIFFERENTIAL/PLATELET
Abs Immature Granulocytes: 0.02 10*3/uL (ref 0.00–0.07)
Basophils Absolute: 0 10*3/uL (ref 0.0–0.1)
Basophils Relative: 1 %
Eosinophils Absolute: 0.2 10*3/uL (ref 0.0–0.5)
Eosinophils Relative: 4 %
HCT: 38.5 % — ABNORMAL LOW (ref 39.0–52.0)
Hemoglobin: 12.3 g/dL — ABNORMAL LOW (ref 13.0–17.0)
Immature Granulocytes: 0 %
Lymphocytes Relative: 24 %
Lymphs Abs: 1.2 10*3/uL (ref 0.7–4.0)
MCH: 29.3 pg (ref 26.0–34.0)
MCHC: 31.9 g/dL (ref 30.0–36.0)
MCV: 91.7 fL (ref 80.0–100.0)
Monocytes Absolute: 0.7 10*3/uL (ref 0.1–1.0)
Monocytes Relative: 14 %
Neutro Abs: 2.9 10*3/uL (ref 1.7–7.7)
Neutrophils Relative %: 57 %
Platelets: 161 10*3/uL (ref 150–400)
RBC: 4.2 MIL/uL — ABNORMAL LOW (ref 4.22–5.81)
RDW: 14.8 % (ref 11.5–15.5)
WBC: 5.1 10*3/uL (ref 4.0–10.5)
nRBC: 0 % (ref 0.0–0.2)

## 2020-10-11 LAB — BASIC METABOLIC PANEL
Anion gap: 10 (ref 5–15)
BUN: 38 mg/dL — ABNORMAL HIGH (ref 8–23)
CO2: 19 mmol/L — ABNORMAL LOW (ref 22–32)
Calcium: 9 mg/dL (ref 8.9–10.3)
Chloride: 107 mmol/L (ref 98–111)
Creatinine, Ser: 2.54 mg/dL — ABNORMAL HIGH (ref 0.61–1.24)
GFR, Estimated: 26 mL/min — ABNORMAL LOW (ref >60)
Glucose, Bld: 118 mg/dL — ABNORMAL HIGH (ref 70–99)
Potassium: 4.7 mmol/L (ref 3.5–5.1)
Sodium: 136 mmol/L (ref 135–145)

## 2020-10-11 LAB — PROTIME-INR
INR: 0.9 (ref 0.8–1.2)
Prothrombin Time: 12.6 seconds (ref 11.4–15.2)

## 2020-10-11 MED ORDER — SODIUM CHLORIDE 0.9 % IV SOLN: INTRAVENOUS | Status: DC

## 2020-10-11 MED ORDER — FENTANYL CITRATE (PF) 100 MCG/2ML IJ SOLN
INTRAMUSCULAR | Status: AC | PRN
  Administered 2020-10-11: 50 ug via INTRAVENOUS

## 2020-10-11 MED ORDER — MIDAZOLAM HCL 2 MG/2ML IJ SOLN
INTRAMUSCULAR | Status: AC | PRN
  Administered 2020-10-11: 1 mg via INTRAVENOUS

## 2020-10-11 MED ORDER — FENTANYL CITRATE (PF) 100 MCG/2ML IJ SOLN
INTRAMUSCULAR | Status: AC
  Filled 2020-10-11: qty 2

## 2020-10-11 MED ORDER — MIDAZOLAM HCL 2 MG/2ML IJ SOLN
INTRAMUSCULAR | Status: AC
  Filled 2020-10-11: qty 2

## 2020-10-11 MED ORDER — LORAZEPAM 2 MG/ML IJ SOLN: INTRAMUSCULAR | Status: AC | PRN

## 2020-10-11 MED ORDER — LIDOCAINE-EPINEPHRINE (PF) 2 %-1:200000 IJ SOLN
INTRAMUSCULAR | Status: AC
  Filled 2020-10-11: qty 20

## 2020-10-11 NOTE — Discharge Instructions (Addendum)
Please call Interventional Radiology clinic [phone] with any questions or concerns.  Per Dr. Pascal Lux, Sonographic evaluation demonstrates significant decrease in size of left inguinal lymph, now with benign fatty hilum. As such, decision made NOT to proceed with image guided Bx at this time.  Moderate Conscious Sedation, Adult, Care After This sheet gives you information about how to care for yourself after your procedure. Your health care provider may also give you more specific instructions. If you have problems or questions, contact your health care provider. What can I expect after the procedure? After the procedure, it is common to have: Sleepiness for several hours. Impaired judgment for several hours. Difficulty with balance. Vomiting if you eat too soon. Follow these instructions at home: For the time period you were told by your health care provider: Rest. Do not participate in activities where you could fall or become injured. Do not drive or use machinery. Do not drink alcohol. Do not take sleeping pills or medicines that cause drowsiness. Do not make important decisions or sign legal documents. Do not take care of children on your own.      Eating and drinking Follow the diet recommended by your health care provider. Drink enough fluid to keep your urine pale yellow. If you vomit: Drink water, juice, or soup when you can drink without vomiting. Make sure you have little or no nausea before eating solid foods.   General instructions Take over-the-counter and prescription medicines only as told by your health care provider. Have a responsible adult stay with you for the time you are told. It is important to have someone help care for you until you are awake and alert. Do not smoke. Keep all follow-up visits as told by your health care provider. This is important. Contact a health care provider if: You are still sleepy or having trouble with balance after 24  hours. You feel light-headed. You keep feeling nauseous or you keep vomiting. You develop a rash. You have a fever. You have redness or swelling around the IV site. Get help right away if: You have trouble breathing. You have new-onset confusion at home. Summary After the procedure, it is common to feel sleepy, have impaired judgment, or feel nauseous if you eat too soon. Rest after you get home. Know the things you should not do after the procedure. Follow the diet recommended by your health care provider and drink enough fluid to keep your urine pale yellow. Get help right away if you have trouble breathing or new-onset confusion at home. This information is not intended to replace advice given to you by your health care provider. Make sure you discuss any questions you have with your health care provider. Document Revised: 08/19/2019 Document Reviewed: 03/17/2019 Elsevier Patient Education  2021 [employer].

## 2020-10-11 NOTE — Procedures (Signed)
Pre Procedure Dx: Indeterminate left inguinal lymph node Post Procedural Dx: Same  Sonographic evaluation demonstrates significant decrease in size of left inguinal lymph, now with benign fatty hilum. As such, decision made NOT to proceed with image guided Bx at this time.  Ronny Bacon, MD Pager #: [phone]

## 2020-10-11 NOTE — Sedation Documentation (Signed)
Procedure cancelled per Dr Pascal Lux. Patient will be transported to short stay by this RN.

## 2020-10-11 NOTE — H&P (Addendum)
Referring Physician(s): Ladell Pier  Supervising Physician: Sandi Mariscal  Patient Status:  [facility]  Chief Complaint: "I'm here for a biopsy"   Subjective: Patient familiar to IR service from Port-A-Cath placement in January 2019 with removal in July 2018.  He has a history of colon cancer in 2017 with prior resection, chemotherapy and colostomy reversal.  Patient recently noticed some left axillary adenopathy following COVID booster vaccination and subsequently underwent follow-up imaging which revealed:   1. Newly enlarged left axillary lymph node at 2.0 cm in diameter, and newly enlarged left inguinal lymph node at 1.9 cm in diameter. Mildly prominent portacaval lymph node appears chronic. While the left axillary nodal prominence could potentially be attributed to a vaccine booster if administered in the left upper extremity over the last 12 weeks, the left inguinal adenopathy seems very unlikely to be caused by an upper extremity booster shot. While reactive etiologies for both lymph nodes cannot be readily excluded, particularly given the faint stranding around the axillary node, neoplastic etiology is likewise difficult to exclude. Potential workup might include left inguinal nodal biopsy or follow up physical exam in sonography in the short term in order to assess for resolution. 2. Stable ascending aortic aneurysm at 4.1 cm in diameter. Recommend annual imaging followup by CTA or MRA. This recommendation follows 2010 ACCF/AHA/AATS/ACR/ASA/SCA/SCAI/SIR/STS/SVM Guidelines for the Diagnosis and Management of Patients with Thoracic Aortic Disease. Circulation. 2010; 121: W979-G921. Aortic aneurysm NOS (ICD10-I71.9) 3. Other imaging findings of potential clinical significance: Aortic Atherosclerosis (ICD10-I70.0). Coronary atherosclerosis. Cholelithiasis. Prominent stool throughout the colon favors constipation. Prostatomegaly. Lumbar impingement at L3-4 and  L4-5. Continued pulmonary arterial prominence potentially reflecting pulmonary arterial hypertension  He presents today for image guided left inguinal lymph node biopsy for further evaluation.  Past Medical History:  Diagnosis Date   Allergy    seasonal allergies   Arthritis 2010   Ascending aorta dilatation (HCC)    4.1 cm noted on CT1/15/21   CKD (chronic kidney disease) stage 2, GFR 60-89 ml/min    see [place], Stage 1   colon ca dx'd 04/2016   colon   Hernia, abdominal    sx fixed   Hyperlipidemia    on meds   Hypertension    on meds   LBBB (left bundle branch block) 08/18/2019   Neuropathy    Pneumonia 05/19/2018   Seasonal allergies    Thrombocytopenia (HCC)    during chemo.   Past Surgical History:  Procedure Laterality Date   BIOPSY  04/10/2016   Procedure: BIOPSY Mesentary;  Surgeon: Judeth Horn, MD;  Location: [place];  Service: General;;   COLONOSCOPY  2019   TA/tubulovillous adenoma   COLOSTOMY  2018   reversed   COLOSTOMY REVERSAL N/A 01/12/2017   Procedure: COLOSTOMY REVERSAL;  Surgeon: Judeth Horn, MD;  Location: [place];  Service: General;  Laterality: N/A;   FLEXIBLE SIGMOIDOSCOPY N/A 04/09/2016   Procedure: FLEXIBLE SIGMOIDOSCOPY;  Surgeon: Ladene Artist, MD;  Location: [facility] ENDOSCOPY;  Service: Endoscopy;  Laterality: N/A;   HERNIA REPAIR     in childhood   INGUINAL HERNIA REPAIR Right 03/29/2018   Procedure: OPEN REPAIR OF RIGHT INGUINAL HERNIA WITH MESH;  Surgeon: Judeth Horn, MD;  Location: [place];  Service: General;  Laterality: Right;   INSERTION OF MESH N/A 03/29/2018   Procedure: INSERTION OF MESH;  Surgeon: Judeth Horn, MD;  Location: [place];  Service: General;  Laterality: N/A;   IR GENERIC HISTORICAL  05/14/2016   IR  US GUIDE VASC ACCESS RIGHT 05/14/2016 Corrie Mckusick, DO [facility]   IR GENERIC HISTORICAL  05/14/2016   IR FLUORO GUIDE PORT INSERTION RIGHT 05/14/2016 Corrie Mckusick, DO [facility]   IR REMOVAL TUN ACCESS W/ PORT W/O FL MOD  SED  11/11/2016   PARTIAL COLECTOMY N/A 04/10/2016   Procedure: SIGMOID  COLECTOMY WITH COLOSTOMY;  Surgeon: Judeth Horn, MD;  Location: [place];  Service: General;  Laterality: N/A;   TONSILLECTOMY     TOTAL KNEE ARTHROPLASTY Left 09/29/2019   Procedure: TOTAL KNEE ARTHROPLASTY;  Surgeon: Paralee Cancel, MD;  Location: [facility];  Service: Orthopedics;  Laterality: Left;  70 mins    Allergies: Patient has no known allergies.  Medications: Prior to Admission medications   Medication Sig Start Date End Date Taking? Authorizing Provider  amLODipine (NORVASC) 10 MG tablet Take 10 mg by mouth daily. 02/11/16  Yes [provider]  labetalol (NORMODYNE) 200 MG tablet Take 200 mg by mouth 3 (three) times daily.  04/23/16  Yes [provider]  loratadine (CLARITIN) 10 MG tablet Take 10 mg by mouth daily as needed for allergies.    Yes [provider]  losartan (COZAAR) 25 MG tablet Take 25 mg by mouth daily. 04/18/20  Yes [provider]  Omega-3 Fatty Acids (FISH OIL) 1200 MG CAPS Take 3,600 mg by mouth daily.     [provider]     Vital Signs:pending   Physical Exam awake, alert.  Chest clear to auscultation bilaterally.  Heart with regular rate and rhythm.  Abdomen protuberant, positive bowel sounds, nontender.  No lower extremity edema.  Noted left axillary/left inguinal adenopathy  Imaging: No results found.  Labs:  CBC: Recent Labs    10/11/20 1205  WBC 5.1  HGB 12.3*  HCT 38.5*  PLT 161    COAGS: Recent Labs    10/11/20 1205  INR 0.9    BMP: Recent Labs    10/11/20 1205  NA 136  K 4.7  CL 107  CO2 19*  GLUCOSE 118*  BUN 38*  CALCIUM 9.0  CREATININE 2.54*  GFRNONAA 26*    LIVER FUNCTION TESTS: No results for input(s): BILITOT, AST, ALT, ALKPHOS, PROT, ALBUMIN in the last 8760 hours.  Assessment and Plan: Patient familiar to IR service from Port-A-Cath placement in January 2019 with removal in July 2018.  He has a  history of colon cancer in 2017 with prior resection, chemotherapy and colostomy reversal.  Patient recently noticed some left axillary adenopathy following COVID booster vaccination and subsequently underwent follow-up imaging which revealed:   1. Newly enlarged left axillary lymph node at 2.0 cm in diameter, and newly enlarged left inguinal lymph node at 1.9 cm in diameter. Mildly prominent portacaval lymph node appears chronic. While the left axillary nodal prominence could potentially be attributed to a vaccine booster if administered in the left upper extremity over the last 12 weeks, the left inguinal adenopathy seems very unlikely to be caused by an upper extremity booster shot. While reactive etiologies for both lymph nodes cannot be readily excluded, particularly given the faint stranding around the axillary node, neoplastic etiology is likewise difficult to exclude. Potential workup might include left inguinal nodal biopsy or follow up physical exam in sonography in the short term in order to assess for resolution. 2. Stable ascending aortic aneurysm at 4.1 cm in diameter. Recommend annual imaging followup by CTA or MRA. This recommendation follows 2010 ACCF/AHA/AATS/ACR/ASA/SCA/SCAI/SIR/STS/SVM Guidelines for the Diagnosis and Management of Patients  with Thoracic Aortic Disease. Circulation. 2010; 121: S923-R007. Aortic aneurysm NOS (ICD10-I71.9) 3. Other imaging findings of potential clinical significance: Aortic Atherosclerosis (ICD10-I70.0). Coronary atherosclerosis. Cholelithiasis. Prominent stool throughout the colon favors constipation. Prostatomegaly. Lumbar impingement at L3-4 and L4-5. Continued pulmonary arterial prominence potentially reflecting pulmonary arterial hypertension  He presents today for image guided left inguinal lymph node biopsy for further evaluation.Risks and benefits of procedure was discussed with the patient including, but not limited to bleeding,  infection, damage to adjacent structures or low yield requiring additional tests.  All of the questions were answered and there is agreement to proceed.  Consent signed and in chart.    Electronically Signed: D. Rowe Robert, PA-C 10/11/2020, 12:42 PM   I spent a total of 20 minutes at the the patient's bedside AND on the patient's hospital floor or unit, greater than 50% of which was counseling/coordinating care for image guided left inguinal lymph node biopsy

## 2020-10-17 ENCOUNTER — Inpatient Hospital Stay: Payer: Medicare Other | Attending: Oncology | Admitting: Oncology

## 2020-10-17 ENCOUNTER — Other Ambulatory Visit: Payer: Self-pay

## 2020-10-17 VITALS — BP 147/72 | HR 72 | Temp 97.9°F | Resp 18 | Ht 76.0 in | Wt 255.2 lb

## 2020-10-17 DIAGNOSIS — Z85038 Personal history of other malignant neoplasm of large intestine: Secondary | ICD-10-CM | POA: Insufficient documentation

## 2020-10-17 DIAGNOSIS — N189 Chronic kidney disease, unspecified: Secondary | ICD-10-CM | POA: Insufficient documentation

## 2020-10-17 DIAGNOSIS — Z9221 Personal history of antineoplastic chemotherapy: Secondary | ICD-10-CM | POA: Insufficient documentation

## 2020-10-17 DIAGNOSIS — C189 Malignant neoplasm of colon, unspecified: Secondary | ICD-10-CM

## 2020-10-17 DIAGNOSIS — Z79899 Other long term (current) drug therapy: Secondary | ICD-10-CM | POA: Insufficient documentation

## 2020-10-17 DIAGNOSIS — Z933 Colostomy status: Secondary | ICD-10-CM | POA: Insufficient documentation

## 2020-10-17 DIAGNOSIS — L723 Sebaceous cyst: Secondary | ICD-10-CM | POA: Insufficient documentation

## 2020-10-17 DIAGNOSIS — I129 Hypertensive chronic kidney disease with stage 1 through stage 4 chronic kidney disease, or unspecified chronic kidney disease: Secondary | ICD-10-CM | POA: Insufficient documentation

## 2020-10-17 NOTE — Progress Notes (Signed)
[facility] OFFICE VISIT PROGRESS NOTE  I connected with Pura Spice on 10/17/20 at  1:40 PM EDT by video enabled telemedicine visit and verified that I am speaking with the correct person using two identifiers.   I discussed the limitations, risks, security and privacy concerns of performing an evaluation and management service by telemedicine and the availability of in-person appointments. I also discussed with the patient that there may be a patient responsible charge related to this service. The patient expressed understanding and agreed to proceed.  Other persons participating in the visit and their role in the encounter: Wife  Patient's location: Office Provider's location: Home   Diagnosis: Colon cancer  INTERVAL HISTORY:  Mr Whirley returns as scheduled.  He was referred for an ultrasound-guided biopsy of the left inguinal lymph node on 10/11/2020.  Ultrasound confirmed a 0.7 m node in the left inguinal region, decreased in size compared to the April CT.  The node had a benign fatty hilum.  A biopsy was not performed.  Mr. Salce feels the left axillary lymph node is unchanged.  The left inguinal node feels smaller.  No other complaint. Objective: He is seen for physical examination by Verita Lamb practitioner  Vital signs in last 24 hours:  Blood pressure (!) 147/72, pulse 72, temperature 97.9 F (36.6 C), temperature source Oral, resp. rate 18, height _0  (1.93 m), weight 255 lb 3.2 oz (115.8 kg), SpO2 99 %.    Lymphatics: Mobile 3 cm left axillary node.  Mobile firm 3 cm left inguinal node.    Lab Results:  Lab Results  Component Value Date   WBC 5.1 10/11/2020   HGB 12.3 (L) 10/11/2020   HCT 38.5 (L) 10/11/2020   MCV 91.7 10/11/2020   PLT 161 10/11/2020   NEUTROABS 2.9 10/11/2020    Medications: I have reviewed the patient's current medications.  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), isolated mesenteric implant-resected, multiple tumor deposits Sigmoid/descending resection and creation of a descending colostomy 04/10/2016 MSI-stable, no loss of mismatch repair protein expression Foundation 1- BRAF V600E positive, MS-stable, intermediate tumor mutation burden, no RAS mutation CT abdomen/pelvis 04/02/2016-no evidence of distant metastatic disease Cycle 1 FOLFOX 05/21/2016 Cycle 2 FOLFOX 06/04/2016 Cycle 3 FOLFOX 06/18/2016 Cycle 4 FOLFOX 07/02/2016 (oxaliplatin held secondary to thrombocytopenia) Cycle 5 FOLFOX 07/16/2016 Cycle 6 FOLFOX 07/30/2016 Cycle 7 FOLFOX 08/13/2016 (oxaliplatin held and 5-FU dose reduced) Cycle 8 FOLFOX 09/03/2016 (oxaliplatin held secondary to neuropathy) Cycle 9 FOLFOX 09/17/2016 (oxaliplatin held secondary to neuropathy) Cycle 10 FOLFOX 10/02/2016 Cycle 11 FOLFOX 10/15/2016 (oxaliplatin eliminated from the regimen) Cycle 12 FOLFOX 10/30/2016 (oxaliplatin eliminated) CTs 05/12/2017-no evidence of recurrent disease, right inguinal hernia containing bladder Colonoscopy 07/21/2017, 3 polyps were removed from the descending and transverse colon, fragments of tubular and tubulovillous adenoma CTs 05/19/2018-no evidence of recurrent disease, status post hernia repair, right upper lobe pneumonia CTs 05/20/2019-resolution of right upper lobe pneumonia, no evidence of metastatic disease Colonoscopy 01/25/2020-multiple polyps removed-tubular adenomas, poor preparation, repeat colonoscopy recommended CTs neck, chest, and abdomen/pelvis 08/27/2020- new to centimeter left axillary node, new 1.9 cm left inguinal node chronic mildly prominent portacaval node   2.   Chronic renal insufficiency   3.   Hypertension   4.  Inflamed sebaceous cyst at the upper back-status post incision and drainage   5.  Thrombocytopenia secondary chemotherapy-oxaliplatin dose reduced beginning with cycle 3 FOLFOX, oxaliplatin held with cycle 4 and cycle 7 FOLFOX   6.  Oxaliplatin neuropathy-improved   7.  History of Mucositis secondary chemotherapy   8.  Symptoms of pneumonia January 2020-CT 05/19/2018 consistent with right upper lobe pneumonia, Levaquin prescribed 05/20/2018   9.  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 Cisnero appears unchanged.  There are persistent enlarged lymph nodes in the left axilla and left groin.  He is now 3 months out from a COVID-19 vaccine.  We discussed the differential diagnosis.  I will contact interventional radiology to discuss a repeat attempt at biopsy of the left inguinal node.  Mr Vandevoort will return for an office visit as scheduled on 12/10/2020.  We will see him sooner depending on the decision for a lymph node biopsy.   I discussed the assessment and treatment plan with the patient. The patient was provided an opportunity to ask questions and all were answered. The patient agreed with the plan and demonstrated an understanding of the instructions.   The patient was advised to call back or seek an in-person evaluation if the symptoms worsen or if the condition fails to improve as anticipated.  I provided 20 minutes of video, chart review, and documentation time during this encounter, and > 50% was spent counseling as documented under my assessment & plan.  Betsy Coder ANP/GNP-BC   10/17/2020 1:56 PM

## 2020-10-18 ENCOUNTER — Other Ambulatory Visit: Payer: Self-pay | Admitting: Nurse Practitioner

## 2020-10-18 DIAGNOSIS — C189 Malignant neoplasm of colon, unspecified: Secondary | ICD-10-CM

## 2020-10-22 ENCOUNTER — Telehealth (HOSPITAL_COMMUNITY): Payer: Self-pay

## 2020-10-22 ENCOUNTER — Encounter (HOSPITAL_COMMUNITY): Payer: Self-pay

## 2020-10-22 NOTE — Progress Notes (Unsigned)
       Patient Demographics  Patient Name  Louis [name], Louis [name] Legal Sex  Male DOB  July 13, 1945 SSN  [id number] Address  [address]  [place] [address] Phone  [phone] [facility])  [phone] (Mobile) *Preferred*     RE: Biopsy Received: 3 days ago Markus Daft, MD  [name] for US guided biopsy of left inguinal lymph node.   [place]         Previous Messages    ----- Message -----  From: Lenore Cordia  Sent: 10/19/2020  11:18 AM EDT  To: Ir Procedure Requests  Subject: Biopsy                                         Procedure Requested: US Biopsy (Lymph Node)    Reason for Procedure: biopsy palpable left inguinal adenopathy   Provider Requesting:  Owens Shark  Provider Telephone:  [phone]    Providers comment: Dr Laurence Ferrari

## 2020-10-29 ENCOUNTER — Ambulatory Visit (HOSPITAL_COMMUNITY)
Admission: RE | Admit: 2020-10-29 | Discharge: 2020-10-29 | Disposition: A | Discharge status: Home / Self Care | Payer: Medicare Other | Source: Ambulatory Visit | Attending: Nurse Practitioner | Admitting: Nurse Practitioner

## 2020-10-29 ENCOUNTER — Other Ambulatory Visit: Payer: Self-pay

## 2020-10-29 ENCOUNTER — Encounter (HOSPITAL_COMMUNITY): Payer: Self-pay

## 2020-10-29 DIAGNOSIS — Z9221 Personal history of antineoplastic chemotherapy: Secondary | ICD-10-CM | POA: Diagnosis not present

## 2020-10-29 DIAGNOSIS — E785 Hyperlipidemia, unspecified: Secondary | ICD-10-CM | POA: Insufficient documentation

## 2020-10-29 DIAGNOSIS — N182 Chronic kidney disease, stage 2 (mild): Secondary | ICD-10-CM | POA: Insufficient documentation

## 2020-10-29 DIAGNOSIS — I712 Thoracic aortic aneurysm, without rupture: Secondary | ICD-10-CM | POA: Diagnosis not present

## 2020-10-29 DIAGNOSIS — Z85038 Personal history of other malignant neoplasm of large intestine: Secondary | ICD-10-CM | POA: Diagnosis not present

## 2020-10-29 DIAGNOSIS — C189 Malignant neoplasm of colon, unspecified: Secondary | ICD-10-CM

## 2020-10-29 DIAGNOSIS — Z79899 Other long term (current) drug therapy: Secondary | ICD-10-CM | POA: Diagnosis not present

## 2020-10-29 DIAGNOSIS — I2510 Atherosclerotic heart disease of native coronary artery without angina pectoris: Secondary | ICD-10-CM | POA: Diagnosis not present

## 2020-10-29 DIAGNOSIS — C774 Secondary and unspecified malignant neoplasm of inguinal and lower limb lymph nodes: Secondary | ICD-10-CM | POA: Insufficient documentation

## 2020-10-29 DIAGNOSIS — Z9049 Acquired absence of other specified parts of digestive tract: Secondary | ICD-10-CM | POA: Diagnosis not present

## 2020-10-29 DIAGNOSIS — R590 Localized enlarged lymph nodes: Secondary | ICD-10-CM | POA: Insufficient documentation

## 2020-10-29 DIAGNOSIS — R599 Enlarged lymph nodes, unspecified: Secondary | ICD-10-CM | POA: Diagnosis present

## 2020-10-29 MED ORDER — FENTANYL CITRATE (PF) 100 MCG/2ML IJ SOLN
INTRAMUSCULAR | Status: AC
  Filled 2020-10-29: qty 2

## 2020-10-29 MED ORDER — FENTANYL CITRATE (PF) 100 MCG/2ML IJ SOLN
INTRAMUSCULAR | Status: AC | PRN
  Administered 2020-10-29 (×2): 50 ug via INTRAVENOUS

## 2020-10-29 MED ORDER — MIDAZOLAM HCL 2 MG/2ML IJ SOLN
INTRAMUSCULAR | Status: AC | PRN
  Administered 2020-10-29 (×2): 1 mg via INTRAVENOUS

## 2020-10-29 MED ORDER — LIDOCAINE HCL 1 % IJ SOLN
INTRAMUSCULAR | Status: AC
  Administered 2020-10-29: 10 mL
  Filled 2020-10-29: qty 20

## 2020-10-29 MED ORDER — SODIUM CHLORIDE 0.9 % IV SOLN: INTRAVENOUS | Status: AC

## 2020-10-29 MED ORDER — MIDAZOLAM HCL 2 MG/2ML IJ SOLN
INTRAMUSCULAR | Status: AC
  Filled 2020-10-29: qty 2

## 2020-10-29 NOTE — Discharge Instructions (Signed)
Interventional radiology phone numbers ?[phone] ?After hours [phone] ? ?Needle Biopsy, Care After ?These instructions tell you how to care for yourself after your procedure. Your doctor may also give you more specific instructions. Call your doctor if you have any problems or questions. ?What can I expect after the procedure? ?After the procedure, it is common to have: ?Soreness. ?Bruising. ?Mild pain. ?Follow these instructions at home: ?Return to your normal activities as told by your doctor. Ask your doctor what activities are safe for you. ?Take over-the-counter and prescription medicines only as told by your doctor. ?Wash your hands with soap and water before you change your bandage (dressing). If you cannot use soap and water, use hand sanitizer. ?Follow instructions from your doctor about: ?How to take care of your puncture site. ?When and how to change your bandage. ?When to remove your bandage. ?Check your puncture site every day for signs of infection. Watch for: ?Redness, swelling, or pain. ?Fluid or blood. ?Pus or a bad smell. ?Warmth. ?Do not take baths, swim, or use a hot tub until your doctor approves. You may remove your dressing tomorrow and shower. ?Keep all follow-up visits as told by your doctor. This is important.   ?Contact a doctor if you have: ?A fever. ?Redness, swelling, or pain at the puncture site, and it lasts longer than a few days. ?Fluid, blood, or pus coming from the puncture site. ?Warmth coming from the puncture site. ?Get help right away if: ?You have a lot of bleeding from the puncture site. ?Summary ?After the procedure, it is common to have soreness, bruising, or mild pain at the puncture site. ?Check your puncture site every day for signs of infection, such as redness, swelling, or pain. ?Get help right away if you have severe bleeding from your puncture site. ?This information is not intended to replace advice given to you by your health care provider. Make sure you  discuss any questions you have with your health care provider. ?Document Revised: 10/20/2019 Document Reviewed: 10/20/2019 ?Elsevier Patient Education ? 2021 Elsevier Inc. ? ? ? ? ?Moderate Conscious Sedation, Adult, Care After ?This sheet gives you information about how to care for yourself after your procedure. Your health care provider may also give you more specific instructions. If you have problems or questions, contact your health care provider. ?What can I expect after the procedure? ?After the procedure, it is common to have: ?Sleepiness for several hours. ?Impaired judgment for several hours. ?Difficulty with balance. ?Vomiting if you eat too soon. ?Follow these instructions at home: ?For the time period you were told by your health care provider: ?Rest. ?Do not participate in activities where you could fall or become injured. ?Do not drive or use machinery. ?Do not drink alcohol. ?Do not take sleeping pills or medicines that cause drowsiness. ?Do not make important decisions or sign legal documents. ?Do not take care of children on your own.  ?  ?  ?Eating and drinking ?Follow the diet recommended by your health care provider. ?Drink enough fluid to keep your urine pale yellow. ?If you vomit: ?Drink water, juice, or soup when you can drink without vomiting. ?Make sure you have little or no nausea before eating solid foods.   ?General instructions ?Take over-the-counter and prescription medicines only as told by your health care provider. ?Have a responsible adult stay with you for the time you are told. It is important to have someone help care for you until you are awake and alert. ?Do not smoke. ?  Keep all follow-up visits as told by your health care provider. This is important. ?Contact a health care provider if: ?You are still sleepy or having trouble with balance after 24 hours. ?You feel light-headed. ?You keep feeling nauseous or you keep vomiting. ?You develop a rash. ?You have a fever. ?You have  redness or swelling around the IV site. ?Get help right away if: ?You have trouble breathing. ?You have new-onset confusion at home. ?Summary ?After the procedure, it is common to feel sleepy, have impaired judgment, or feel nauseous if you eat too soon. ?Rest after you get home. Know the things you should not do after the procedure. ?Follow the diet recommended by your health care provider and drink enough fluid to keep your urine pale yellow. ?Get help right away if you have trouble breathing or new-onset confusion at home. ?This information is not intended to replace advice given to you by your health care provider. Make sure you discuss any questions you have with your health care provider. ?Document Revised: 08/19/2019 Document Reviewed: 03/17/2019 ?Elsevier Patient Education ? 2021 Elsevier Inc.  ?

## 2020-10-29 NOTE — Procedures (Addendum)
Interventional Radiology Procedure:   Indications: History of colon cancer with left inguinal lymph node enlargement  Procedure: US guided left inguinal lymph node biopsy  Findings: Enlarged left inguinal node.  Multiple cores obtained and specimens placed in saline.  Complications: None     EBL: less than 10 ml   Plan: Discharge in 1 hour.    Yaret Hush R. Anselm Pancoast, MD  Pager: [phone]

## 2020-10-29 NOTE — H&P (Signed)
Referring Physician(s): Sherrill,B  Supervising Physician: Markus Daft  Patient Status:  [facility]  Chief Complaint:  "I'm here again for a biopsy"  Subjective: Patient familiar to IR service from Port-A-Cath placement in January 2019 with removal in July 2018.  He has a history of colon cancer in 2017 with prior resection, chemotherapy and colostomy reversal.  Patient recently noticed some left axillary adenopathy following COVID booster vaccination and subsequently underwent follow-up imaging which revealed:    1. Newly enlarged left axillary lymph node at 2.0 cm in diameter, and newly enlarged left inguinal lymph node at 1.9 cm in diameter. Mildly prominent portacaval lymph node appears chronic. While the left axillary nodal prominence could potentially be attributed to a vaccine booster if administered in the left upper extremity over the last 12 weeks, the left inguinal adenopathy seems very unlikely to be caused by an upper extremity booster shot. While reactive etiologies for both lymph nodes cannot be readily excluded, particularly given the faint stranding around the axillary node, neoplastic etiology is likewise difficult to exclude. Potential workup might include left inguinal nodal biopsy or follow up physical exam in sonography in the short term in order to assess for resolution. 2. Stable ascending aortic aneurysm at 4.1 cm in diameter. Recommend annual imaging followup by CTA or MRA. This recommendation follows 2010 ACCF/AHA/AATS/ACR/ASA/SCA/SCAI/SIR/STS/SVM Guidelines for the Diagnosis and Management of Patients with Thoracic Aortic Disease. Circulation. 2010; 121: Q657-Q469. Aortic aneurysm NOS (ICD10-I71.9) 3. Other imaging findings of potential clinical significance: Aortic Atherosclerosis (ICD10-I70.0). Coronary atherosclerosis. Cholelithiasis. Prominent stool throughout the colon favors constipation. Prostatomegaly. Lumbar impingement at L3-4 and  L4-5. Continued pulmonary arterial prominence potentially reflecting pulmonary arterial hypertension  He was originally scheduled for left inguinal lymph node biopsy on 10/11/20, however ultrasound demonstrated significant decrease in size of the left inguinal node with benign fatty hilum and procedure was not performed.  He now presents with persistent left inguinal adenopathy and request received once again for biopsy.  He currently denies fever, headache, chest pain, dyspnea, cough, abdominal/back pain, nausea, vomiting or bleeding.  Past Medical History:  Diagnosis Date   Allergy    seasonal allergies   Arthritis 2010   Ascending aorta dilatation (HCC)    4.1 cm noted on CT1/15/21   CKD (chronic kidney disease) stage 2, GFR 60-89 ml/min    see [place], Stage 1   colon ca dx'd 04/2016   colon   Hernia, abdominal    sx fixed   Hyperlipidemia    on meds   Hypertension    on meds   LBBB (left bundle branch block) 08/18/2019   Neuropathy    Pneumonia 05/19/2018   Seasonal allergies    Thrombocytopenia (HCC)    during chemo.   Past Surgical History:  Procedure Laterality Date   BIOPSY  04/10/2016   Procedure: BIOPSY Mesentary;  Surgeon: Judeth Horn, MD;  Location: [place];  Service: General;;   COLONOSCOPY  2019   TA/tubulovillous adenoma   COLOSTOMY  2018   reversed   COLOSTOMY REVERSAL N/A 01/12/2017   Procedure: COLOSTOMY REVERSAL;  Surgeon: Judeth Horn, MD;  Location: [place];  Service: General;  Laterality: N/A;   FLEXIBLE SIGMOIDOSCOPY N/A 04/09/2016   Procedure: FLEXIBLE SIGMOIDOSCOPY;  Surgeon: Ladene Artist, MD;  Location: [facility] ENDOSCOPY;  Service: Endoscopy;  Laterality: N/A;   HERNIA REPAIR     in childhood   INGUINAL HERNIA REPAIR Right 03/29/2018   Procedure: OPEN REPAIR OF RIGHT INGUINAL HERNIA WITH MESH;  Surgeon: Judeth Horn, MD;  Location: [place];  Service: General;  Laterality: Right;   INSERTION OF MESH N/A 03/29/2018   Procedure: INSERTION OF MESH;   Surgeon: Judeth Horn, MD;  Location: [place];  Service: General;  Laterality: N/A;   IR GENERIC HISTORICAL  05/14/2016   IR US GUIDE VASC ACCESS RIGHT 05/14/2016 Corrie Mckusick, DO [facility]   IR GENERIC HISTORICAL  05/14/2016   IR FLUORO GUIDE PORT INSERTION RIGHT 05/14/2016 Corrie Mckusick, DO [facility]   IR REMOVAL TUN ACCESS W/ PORT W/O FL MOD SED  11/11/2016   PARTIAL COLECTOMY N/A 04/10/2016   Procedure: SIGMOID  COLECTOMY WITH COLOSTOMY;  Surgeon: Judeth Horn, MD;  Location: [place];  Service: General;  Laterality: N/A;   TONSILLECTOMY     TOTAL KNEE ARTHROPLASTY Left 09/29/2019   Procedure: TOTAL KNEE ARTHROPLASTY;  Surgeon: Paralee Cancel, MD;  Location: [facility];  Service: Orthopedics;  Laterality: Left;  70 mins      Allergies: Patient has no known allergies.  Medications: Prior to Admission medications   Medication Sig Start Date End Date Taking? Authorizing Provider  amLODipine (NORVASC) 10 MG tablet Take 10 mg by mouth daily. 02/11/16  Yes [provider]  labetalol (NORMODYNE) 200 MG tablet Take 200 mg by mouth 3 (three) times daily.  04/23/16  Yes [provider]  loratadine (CLARITIN) 10 MG tablet Take 10 mg by mouth daily as needed for allergies.    Yes [provider]  losartan (COZAAR) 25 MG tablet Take 25 mg by mouth daily. 04/18/20  Yes [provider]  Omega-3 Fatty Acids (FISH OIL) 1200 MG CAPS Take 3,600 mg by mouth daily.    Yes [provider]     Vital Signs: BP (!) 155/87   Pulse 69   Temp 98.1 F (36.7 C) (Oral)   Resp 18   SpO2 97%   Physical Exam awake, alert.  Chest clear to auscultation bilaterally.  Heart with regular rate and rhythm.  Abdomen soft, positive bowel sounds, nontender.  No lower extremity edema. Left axillary/inguinal adenopathy noted, nontender ;no LE edema  Imaging: No results found.  Labs:  CBC: Recent Labs    10/11/20 1205  WBC 5.1  HGB 12.3*  HCT 38.5*  PLT 161     COAGS: Recent Labs    10/11/20 1205  INR 0.9    BMP: Recent Labs    10/11/20 1205  NA 136  K 4.7  CL 107  CO2 19*  GLUCOSE 118*  BUN 38*  CALCIUM 9.0  CREATININE 2.54*  GFRNONAA 26*    LIVER FUNCTION TESTS: No results for input(s): BILITOT, AST, ALT, ALKPHOS, PROT, ALBUMIN in the last 8760 hours.  Assessment and Plan: Patient familiar to IR service from Port-A-Cath placement in January 2019 with removal in July 2018.  He has a history of colon cancer in 2017 with prior resection, chemotherapy and colostomy reversal.  Patient recently noticed some left axillary adenopathy following COVID booster vaccination and subsequently underwent follow-up imaging which revealed:    1. Newly enlarged left axillary lymph node at 2.0 cm in diameter, and newly enlarged left inguinal lymph node at 1.9 cm in diameter. Mildly prominent portacaval lymph node appears chronic. While the left axillary nodal prominence could potentially be attributed to a vaccine booster if administered in the left upper extremity over the last 12 weeks, the left inguinal adenopathy seems very unlikely to be caused by an upper extremity booster shot. While reactive etiologies  for both lymph nodes cannot be readily excluded, particularly given the faint stranding around the axillary node, neoplastic etiology is likewise difficult to exclude. Potential workup might include left inguinal nodal biopsy or follow up physical exam in sonography in the short term in order to assess for resolution. 2. Stable ascending aortic aneurysm at 4.1 cm in diameter. Recommend annual imaging followup by CTA or MRA. This recommendation follows 2010 ACCF/AHA/AATS/ACR/ASA/SCA/SCAI/SIR/STS/SVM Guidelines for the Diagnosis and Management of Patients with Thoracic Aortic Disease. Circulation. 2010; 121: T660-A004. Aortic aneurysm NOS (ICD10-I71.9) 3. Other imaging findings of potential clinical significance: Aortic Atherosclerosis  (ICD10-I70.0). Coronary atherosclerosis. Cholelithiasis. Prominent stool throughout the colon favors constipation. Prostatomegaly. Lumbar impingement at L3-4 and L4-5. Continued pulmonary arterial prominence potentially reflecting pulmonary arterial hypertension  He was originally scheduled for left inguinal lymph node biopsy on 10/11/20, however ultrasound demonstrated significant decrease in size of the left inguinal node with benign fatty hilum and procedure was not performed.  He now presents with persistent left inguinal adenopathy and request received once again for biopsy.Risks and benefits of procedure was discussed with the patient including, but not limited to bleeding, infection, damage to adjacent structures or low yield requiring additional tests.  All of the questions were answered and there is agreement to proceed.  Consent signed and in chart.    Electronically Signed: D. Rowe Robert, PA-C 10/29/2020, 12:01 PM   I spent a total of 20 minutes at the the patient's bedside AND on the patient's hospital floor or unit, greater than 50% of which was counseling/coordinating care for image guided left inguinal lymph node biopsy

## 2020-10-30 LAB — SURGICAL PATHOLOGY

## 2020-10-31 ENCOUNTER — Inpatient Hospital Stay (HOSPITAL_BASED_OUTPATIENT_CLINIC_OR_DEPARTMENT_OTHER): Payer: Medicare Other | Admitting: Oncology

## 2020-10-31 ENCOUNTER — Other Ambulatory Visit: Payer: Self-pay

## 2020-10-31 VITALS — BP 155/73 | HR 70 | Temp 98.4°F | Resp 18 | Ht 76.0 in | Wt 250.0 lb

## 2020-10-31 DIAGNOSIS — Z79899 Other long term (current) drug therapy: Secondary | ICD-10-CM | POA: Diagnosis not present

## 2020-10-31 DIAGNOSIS — C189 Malignant neoplasm of colon, unspecified: Secondary | ICD-10-CM | POA: Diagnosis not present

## 2020-10-31 DIAGNOSIS — I129 Hypertensive chronic kidney disease with stage 1 through stage 4 chronic kidney disease, or unspecified chronic kidney disease: Secondary | ICD-10-CM | POA: Diagnosis not present

## 2020-10-31 DIAGNOSIS — Z933 Colostomy status: Secondary | ICD-10-CM | POA: Diagnosis not present

## 2020-10-31 DIAGNOSIS — Z9221 Personal history of antineoplastic chemotherapy: Secondary | ICD-10-CM | POA: Diagnosis not present

## 2020-10-31 DIAGNOSIS — L723 Sebaceous cyst: Secondary | ICD-10-CM | POA: Diagnosis not present

## 2020-10-31 DIAGNOSIS — Z85038 Personal history of other malignant neoplasm of large intestine: Secondary | ICD-10-CM | POA: Diagnosis not present

## 2020-10-31 DIAGNOSIS — N189 Chronic kidney disease, unspecified: Secondary | ICD-10-CM | POA: Diagnosis not present

## 2020-10-31 NOTE — Progress Notes (Signed)
[facility] OFFICE PROGRESS NOTE   Diagnosis: Colon cancer  INTERVAL HISTORY:   Mr Kille underwent an ultrasound-guided biopsy of the left upper inguinal lymph node on 10/29/2020.  He reports tolerating procedure well. No new complaint.  He feels well.  The pathology from the lymph node biopsy revealed metastatic adenocarcinoma, morphologically consistent with colon adenocarcinoma.  Objective:  Vital signs in last 24 hours:  Blood pressure (!) 155/73, pulse 70, temperature 98.4 F (36.9 C), temperature source Oral, resp. rate 18, height '6\' 4"'  (1.93 m), weight 250 lb (113.4 kg), SpO2 99 %.    Lymphatics: No cervical, supraclavicular, right axillary, or right inguinal nodes.  3 cm mobile left axillary node.  4-5 cm mobile left inguinal node Resp: Lungs clear bilaterally Cardio: Regular rate and rhythm GI: No hepatosplenomegaly, no mass, nontender Vascular: No leg edema   Lab Results:  Lab Results  Component Value Date   WBC 5.1 10/11/2020   HGB 12.3 (L) 10/11/2020   HCT 38.5 (L) 10/11/2020   MCV 91.7 10/11/2020   PLT 161 10/11/2020   NEUTROABS 2.9 10/11/2020    CMP  Lab Results  Component Value Date   NA 136 10/11/2020   K 4.7 10/11/2020   CL 107 10/11/2020   CO2 19 (L) 10/11/2020   GLUCOSE 118 (H) 10/11/2020   BUN 38 (H) 10/11/2020   CREATININE 2.54 (H) 10/11/2020   CALCIUM 9.0 10/11/2020   PROT 7.0 01/08/2017   ALBUMIN 3.9 01/08/2017   AST 20 01/08/2017   ALT 20 01/08/2017   ALKPHOS 65 01/08/2017   BILITOT 0.6 01/08/2017   GFRNONAA 26 (L) 10/11/2020   GFRAA 28 (L) 09/20/2019    Lab Results  Component Value Date   CEA1 1.34 06/11/2020    Lab Results  Component Value Date   INR 0.9 10/11/2020    Imaging:  [place] CORE BIOPSY (LYMPH NODES)  Result Date: 10/29/2020 INDICATION: 75 year old with history of colon cancer and an enlarged left inguinal lymph node. EXAM: ULTRASOUND-GUIDED CORE BIOPSY OF LEFT INGUINAL LYMPH NODE MEDICATIONS:  None. ANESTHESIA/SEDATION: Fentanyl 100 mcg IV; Versed 2.0 mg IV Moderate Sedation Time:  14 minutes The patient was continuously monitored during the procedure by the interventional radiology nurse under my direct supervision. PROCEDURE: Left inguinal region was evaluated with ultrasound. An enlarged hypoechoic lymph node was identified. This area was prepped with chlorhexidine and sterile field was created. Maximal barrier sterile technique was utilized including caps, mask, sterile gowns, sterile gloves, sterile drape, hand hygiene and skin antiseptic. Skin was anesthetized using 1% lidocaine. Small incision was made. Using ultrasound guidance, 18 gauge core biopsy device was directed into the enlarged lymph node and core specimens were obtained. Specimens placed in saline. Bandage placed over the puncture site. COMPLICATIONS: None immediate. FINDINGS: Enlarged hypoechoic lymph node in the left upper inguinal region. This lymph node measures 5.1 x 2.4 x 2.8 cm. Needle placement confirmed within the lymph node. No immediate bleeding or hematoma formation. IMPRESSION: Successful ultrasound-guided core biopsy of the enlarged left inguinal lymph node. Electronically Signed   By: Markus Daft M.D.   On: 10/29/2020 16:11    Medications: I have reviewed the patient's current medications.   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), isolated mesenteric implant-resected, multiple tumor deposits Sigmoid/descending resection and creation of a descending colostomy 04/10/2016 MSI-stable, no loss of mismatch repair protein expression Foundation 1- BRAF V600E positive, MS-stable, intermediate tumor mutation burden, no RAS mutation CT abdomen/pelvis 04/02/2016-no evidence of distant metastatic disease  Cycle 1 FOLFOX 05/21/2016 Cycle 2 FOLFOX 06/04/2016 Cycle 3 FOLFOX 06/18/2016 Cycle 4 FOLFOX 07/02/2016 (oxaliplatin held secondary to thrombocytopenia) Cycle 5 FOLFOX 07/16/2016 Cycle 6 FOLFOX  07/30/2016 Cycle 7 FOLFOX 08/13/2016 (oxaliplatin held and 5-FU dose reduced) Cycle 8 FOLFOX 09/03/2016 (oxaliplatin held secondary to neuropathy) Cycle 9 FOLFOX 09/17/2016 (oxaliplatin held secondary to neuropathy) Cycle 10 FOLFOX 10/02/2016 Cycle 11 FOLFOX 10/15/2016 (oxaliplatin eliminated from the regimen) Cycle 12 FOLFOX 10/30/2016 (oxaliplatin eliminated) CTs 05/12/2017-no evidence of recurrent disease, right inguinal hernia containing bladder Colonoscopy 07/21/2017, 3 polyps were removed from the descending and transverse colon, fragments of tubular and tubulovillous adenoma CTs 05/19/2018-no evidence of recurrent disease, status post hernia repair, right upper lobe pneumonia CTs 05/20/2019-resolution of right upper lobe pneumonia, no evidence of metastatic disease Colonoscopy 01/25/2020-multiple polyps removed-tubular adenomas, poor preparation, repeat colonoscopy recommended CTs neck, chest, and abdomen/pelvis 08/27/2020- new 2 centimeter left axillary node, new 1.9 cm left inguinal node chronic mildly prominent portacaval node Ultrasound biopsy of left inguinal node on 10/29/2020-metastatic adenocarcinoma consistent with colon adenocarcinoma   2.   Chronic renal insufficiency   3.   Hypertension   4.  Inflamed sebaceous cyst at the upper back-status post incision and drainage   5.  Thrombocytopenia secondary chemotherapy-oxaliplatin dose reduced beginning with cycle 3 FOLFOX, oxaliplatin held with cycle 4 and cycle 7 FOLFOX   6.  Oxaliplatin neuropathy-improved   7.  History of Mucositis secondary chemotherapy   8.  Symptoms of pneumonia January 2020-CT 05/19/2018 consistent with right upper lobe pneumonia, Levaquin prescribed 05/20/2018   9.  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 Authement appears unchanged.  He has persistent enlarged left axillary and left inguinal lymph nodes.  Biopsy of the left inguinal node  is consistent with metastatic colon cancer.  I discussed the biopsy finding with him.  His wife was present for today's visit.  He has been diagnosed with metastatic colon cancer.  There is no clinical or radiologic evidence of disease other than the left axillary and left inguinal nodes.  He will be referred for restaging PET scan.  We will then decide on treatment.  We had a general discussion regarding treatment options including observation, surgical removal of the palpable lymph nodes, and systemic therapy.  The colon cancer from 2017 had a BRAF mutation.  He will be a candidate for targeted therapy if/when he begin systemic treatment.  He will be scheduled for a PET scan next week.  He will return for an office visit on 11/14/2020.  I will present his case at the GI tumor conference on 11/14/2020.  Betsy Coder, MD  10/31/2020  1:58 PM

## 2020-11-12 ENCOUNTER — Other Ambulatory Visit: Payer: Self-pay

## 2020-11-12 ENCOUNTER — Encounter (HOSPITAL_COMMUNITY)
Admission: RE | Admit: 2020-11-12 | Discharge: 2020-11-12 | Disposition: A | Discharge status: Home / Self Care | Payer: Medicare Other | Source: Ambulatory Visit | Attending: Oncology | Admitting: Oncology

## 2020-11-12 DIAGNOSIS — C189 Malignant neoplasm of colon, unspecified: Secondary | ICD-10-CM | POA: Diagnosis not present

## 2020-11-12 DIAGNOSIS — R590 Localized enlarged lymph nodes: Secondary | ICD-10-CM | POA: Diagnosis not present

## 2020-11-12 LAB — GLUCOSE, CAPILLARY: Glucose-Capillary: 143 mg/dL — ABNORMAL HIGH (ref 70–99)

## 2020-11-12 MED ORDER — FLUDEOXYGLUCOSE F - 18 (FDG) INJECTION
13.0000 | Freq: Once | INTRAVENOUS | Status: AC | PRN
  Administered 2020-11-12: 12.49 via INTRAVENOUS

## 2020-11-14 ENCOUNTER — Ambulatory Visit: Payer: Medicare Other | Admitting: Oncology

## 2020-11-14 ENCOUNTER — Other Ambulatory Visit: Payer: Self-pay

## 2020-11-14 NOTE — Progress Notes (Signed)
The proposed treatment discussed in conference is for discussion purpose only and is not a binding recommendation.  The patients have not been physically examined, or presented with their treatment options.  Therefore, final treatment plans cannot be decided.  

## 2020-11-16 ENCOUNTER — Other Ambulatory Visit: Payer: Self-pay

## 2020-11-16 ENCOUNTER — Inpatient Hospital Stay: Payer: Medicare Other | Attending: Oncology | Admitting: Oncology

## 2020-11-16 VITALS — BP 143/67 | HR 70 | Temp 97.5°F | Resp 18 | Wt 251.6 lb

## 2020-11-16 DIAGNOSIS — N189 Chronic kidney disease, unspecified: Secondary | ICD-10-CM | POA: Diagnosis not present

## 2020-11-16 DIAGNOSIS — C189 Malignant neoplasm of colon, unspecified: Secondary | ICD-10-CM

## 2020-11-16 DIAGNOSIS — I10 Essential (primary) hypertension: Secondary | ICD-10-CM | POA: Insufficient documentation

## 2020-11-16 DIAGNOSIS — Z79899 Other long term (current) drug therapy: Secondary | ICD-10-CM | POA: Diagnosis not present

## 2020-11-16 DIAGNOSIS — C773 Secondary and unspecified malignant neoplasm of axilla and upper limb lymph nodes: Secondary | ICD-10-CM | POA: Insufficient documentation

## 2020-11-16 DIAGNOSIS — Z933 Colostomy status: Secondary | ICD-10-CM | POA: Insufficient documentation

## 2020-11-16 DIAGNOSIS — C187 Malignant neoplasm of sigmoid colon: Secondary | ICD-10-CM | POA: Insufficient documentation

## 2020-11-16 NOTE — Progress Notes (Signed)
[facility] OFFICE PROGRESS NOTE   Diagnosis: Colon cancer  INTERVAL HISTORY:   Louis [name] returns as scheduled.  He is here with his wife and daughter.  He feels well.  He believes the left inguinal node is slightly larger.  Objective:  Vital signs in last 24 hours:  Blood pressure (!) 143/67, pulse 70, temperature (!) 97.5 F (36.4 C), temperature source Tympanic, resp. rate 18, weight 251 lb 9.6 oz (114.1 kg), SpO2 99 %.    Lymphatics: 3-4 cm mobile left axillary node, 5-6 cm node at the superior lateral left inguinal region GI: No hepatomegaly Vascular: No leg edema   Lab Results:  Lab Results  Component Value Date   WBC 5.1 10/11/2020   HGB 12.3 (L) 10/11/2020   HCT 38.5 (L) 10/11/2020   MCV 91.7 10/11/2020   PLT 161 10/11/2020   NEUTROABS 2.9 10/11/2020    CMP  Lab Results  Component Value Date   NA 136 10/11/2020   K 4.7 10/11/2020   CL 107 10/11/2020   CO2 19 (L) 10/11/2020   GLUCOSE 118 (H) 10/11/2020   BUN 38 (H) 10/11/2020   CREATININE 2.54 (H) 10/11/2020   CALCIUM 9.0 10/11/2020   PROT 7.0 01/08/2017   ALBUMIN 3.9 01/08/2017   AST 20 01/08/2017   ALT 20 01/08/2017   ALKPHOS 65 01/08/2017   BILITOT 0.6 01/08/2017   GFRNONAA 26 (L) 10/11/2020   GFRAA 28 (L) 09/20/2019    Lab Results  Component Value Date   CEA1 1.34 06/11/2020   CEA 5.0 (H) 04/13/2016     Medications: I have reviewed the patient's current medications.   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), isolated mesenteric implant-resected, multiple tumor deposits Sigmoid/descending resection and creation of a descending colostomy 04/10/2016 MSI-stable, no loss of mismatch repair protein expression Foundation 1- BRAF V600E positive, MS-stable, intermediate tumor mutation burden, no RAS mutation CT abdomen/pelvis 04/02/2016-no evidence of distant metastatic disease Cycle 1 FOLFOX 05/21/2016 Cycle 2 FOLFOX 06/04/2016 Cycle 3 FOLFOX  06/18/2016 Cycle 4 FOLFOX 07/02/2016 (oxaliplatin held secondary to thrombocytopenia) Cycle 5 FOLFOX 07/16/2016 Cycle 6 FOLFOX 07/30/2016 Cycle 7 FOLFOX 08/13/2016 (oxaliplatin held and 5-FU dose reduced) Cycle 8 FOLFOX 09/03/2016 (oxaliplatin held secondary to neuropathy) Cycle 9 FOLFOX 09/17/2016 (oxaliplatin held secondary to neuropathy) Cycle 10 FOLFOX 10/02/2016 Cycle 11 FOLFOX 10/15/2016 (oxaliplatin eliminated from the regimen) Cycle 12 FOLFOX 10/30/2016 (oxaliplatin eliminated) CTs 05/12/2017-no evidence of recurrent disease, right inguinal hernia containing bladder Colonoscopy 07/21/2017, 3 polyps were removed from the descending and transverse colon, fragments of tubular and tubulovillous adenoma CTs 05/19/2018-no evidence of recurrent disease, status post hernia repair, right upper lobe pneumonia CTs 05/20/2019-resolution of right upper lobe pneumonia, no evidence of metastatic disease Colonoscopy 01/25/2020-multiple polyps removed-tubular adenomas, poor preparation, repeat colonoscopy recommended CTs neck, chest, and abdomen/pelvis 08/27/2020- new 2 centimeter left axillary node, new 1.9 cm left inguinal node chronic mildly prominent portacaval node Ultrasound biopsy of left inguinal node on 10/29/2020-metastatic adenocarcinoma consistent with colon adenocarcinoma PET [id number]/49/[id number] hypermetabolic left inguinal and left axillary nodes, solitary focus of hypermetabolic activity in the right lower quadrant small bowel   2.   Chronic renal insufficiency   3.   Hypertension   4.  Inflamed sebaceous cyst at the upper back-status post incision and drainage   5.  Thrombocytopenia secondary chemotherapy-oxaliplatin dose reduced beginning with cycle 3 FOLFOX, oxaliplatin held with cycle 4 and cycle 7 FOLFOX   6.  Oxaliplatin neuropathy-improved   7.  History of Mucositis secondary  chemotherapy   8.  Symptoms of pneumonia January 2020-CT 05/19/2018 consistent with right upper lobe  pneumonia, Levaquin prescribed 05/20/2018   9.  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  He has been diagnosed with metastatic colon cancer.  He appears to have metastatic disease involving axillary/inguinal nodes, and a small bowel lesion.  I reviewed the PET and CT images with him today.  I presented his case at the GI tumor conference earlier this week.  The small bowel lesion is apparent on CT.  It is unclear whether this represents a metastasis or a small bowel primary.  I discussed treatment options with Louis. [name] and his family.  We discussed observation as he is asymptomatic.  We discussed resection of the axillary lymph nodes and small bowel lesion.  They understand surgery with most likely not be curative.  We also discussed palliative systemic therapy options including targeted therapy in the setting of a BRAF mutation.  He saw Dr. Reynaldo Minium in 2018.  He would like to see Dr. Reynaldo Minium for a second opinion.  He will return for an office visit in 2-3 weeks.  Betsy Coder, MD  11/16/2020  10:38 AM

## 2020-11-19 ENCOUNTER — Encounter: Payer: Self-pay | Admitting: *Deleted

## 2020-11-19 NOTE — Progress Notes (Signed)
Faxed referral order, demographics and chart information to [facility] [phone]. Was informed by [facility] he is considered a new patient again since they last saw him in 2018. Radiology contacted to push over images.

## 2020-11-20 ENCOUNTER — Ambulatory Visit (HOSPITAL_COMMUNITY): Payer: Medicare Other

## 2020-11-20 ENCOUNTER — Other Ambulatory Visit (HOSPITAL_COMMUNITY): Payer: Medicare Other

## 2020-11-28 ENCOUNTER — Ambulatory Visit: Payer: Medicare Other | Admitting: Oncology

## 2020-11-28 DIAGNOSIS — C186 Malignant neoplasm of descending colon: Secondary | ICD-10-CM | POA: Diagnosis not present

## 2020-12-05 ENCOUNTER — Other Ambulatory Visit: Payer: Self-pay

## 2020-12-05 ENCOUNTER — Inpatient Hospital Stay: Payer: Medicare Other | Attending: Oncology | Admitting: Oncology

## 2020-12-05 ENCOUNTER — Inpatient Hospital Stay: Payer: Medicare Other

## 2020-12-05 ENCOUNTER — Telehealth: Payer: Self-pay | Admitting: Oncology

## 2020-12-05 VITALS — BP 144/77 | HR 71 | Temp 98.2°F | Resp 18 | Ht 76.0 in | Wt 250.0 lb

## 2020-12-05 DIAGNOSIS — D6959 Other secondary thrombocytopenia: Secondary | ICD-10-CM | POA: Insufficient documentation

## 2020-12-05 DIAGNOSIS — G620 Drug-induced polyneuropathy: Secondary | ICD-10-CM | POA: Insufficient documentation

## 2020-12-05 DIAGNOSIS — Z7189 Other specified counseling: Secondary | ICD-10-CM | POA: Diagnosis not present

## 2020-12-05 DIAGNOSIS — C774 Secondary and unspecified malignant neoplasm of inguinal and lower limb lymph nodes: Secondary | ICD-10-CM | POA: Diagnosis not present

## 2020-12-05 DIAGNOSIS — Z5111 Encounter for antineoplastic chemotherapy: Secondary | ICD-10-CM | POA: Diagnosis not present

## 2020-12-05 DIAGNOSIS — C189 Malignant neoplasm of colon, unspecified: Secondary | ICD-10-CM

## 2020-12-05 DIAGNOSIS — C187 Malignant neoplasm of sigmoid colon: Secondary | ICD-10-CM | POA: Diagnosis not present

## 2020-12-05 DIAGNOSIS — N189 Chronic kidney disease, unspecified: Secondary | ICD-10-CM | POA: Insufficient documentation

## 2020-12-05 DIAGNOSIS — I129 Hypertensive chronic kidney disease with stage 1 through stage 4 chronic kidney disease, or unspecified chronic kidney disease: Secondary | ICD-10-CM | POA: Diagnosis not present

## 2020-12-05 DIAGNOSIS — Z452 Encounter for adjustment and management of vascular access device: Secondary | ICD-10-CM | POA: Insufficient documentation

## 2020-12-05 LAB — CBC WITH DIFFERENTIAL (CANCER CENTER ONLY)
Abs Immature Granulocytes: 0.01 10*3/uL (ref 0.00–0.07)
Basophils Absolute: 0 10*3/uL (ref 0.0–0.1)
Basophils Relative: 1 %
Eosinophils Absolute: 0.3 10*3/uL (ref 0.0–0.5)
Eosinophils Relative: 7 %
HCT: 35.3 % — ABNORMAL LOW (ref 39.0–52.0)
Hemoglobin: 11.2 g/dL — ABNORMAL LOW (ref 13.0–17.0)
Immature Granulocytes: 0 %
Lymphocytes Relative: 24 %
Lymphs Abs: 1.1 10*3/uL (ref 0.7–4.0)
MCH: 27.9 pg (ref 26.0–34.0)
MCHC: 31.7 g/dL (ref 30.0–36.0)
MCV: 87.8 fL (ref 80.0–100.0)
Monocytes Absolute: 0.5 10*3/uL (ref 0.1–1.0)
Monocytes Relative: 12 %
Neutro Abs: 2.6 10*3/uL (ref 1.7–7.7)
Neutrophils Relative %: 56 %
Platelet Count: 164 10*3/uL (ref 150–400)
RBC: 4.02 MIL/uL — ABNORMAL LOW (ref 4.22–5.81)
RDW: 14.3 % (ref 11.5–15.5)
WBC Count: 4.5 10*3/uL (ref 4.0–10.5)
nRBC: 0 % (ref 0.0–0.2)

## 2020-12-05 LAB — CEA (ACCESS): CEA (CHCC): 3.24 ng/mL (ref 0.00–5.00)

## 2020-12-05 LAB — CMP (CANCER CENTER ONLY)
ALT: 9 U/L (ref 0–44)
AST: 9 U/L — ABNORMAL LOW (ref 15–41)
Albumin: 4 g/dL (ref 3.5–5.0)
Alkaline Phosphatase: 72 U/L (ref 38–126)
Anion gap: 10 (ref 5–15)
BUN: 36 mg/dL — ABNORMAL HIGH (ref 8–23)
CO2: 20 mmol/L — ABNORMAL LOW (ref 22–32)
Calcium: 9.1 mg/dL (ref 8.9–10.3)
Chloride: 111 mmol/L (ref 98–111)
Creatinine: 2.34 mg/dL — ABNORMAL HIGH (ref 0.61–1.24)
GFR, Estimated: 28 mL/min — ABNORMAL LOW (ref >60)
Glucose, Bld: 105 mg/dL — ABNORMAL HIGH (ref 70–99)
Potassium: 4.4 mmol/L (ref 3.5–5.1)
Sodium: 141 mmol/L (ref 135–145)
Total Bilirubin: 0.3 mg/dL (ref 0.3–1.2)
Total Protein: 7.1 g/dL (ref 6.5–8.1)

## 2020-12-05 NOTE — Telephone Encounter (Signed)
I called and left a detailed message for patient regarding appointment for port placement will be at [facility] on 8/15 @ 12:00.  He will need to arrive by 10:00 am and report to admitting, NPO 6 hrs prior(nothing after 6am).  I left my name and number in case he had any questions regarding these instructions

## 2020-12-05 NOTE — Progress Notes (Signed)
DISCONTINUE OFF PATHWAY REGIMEN - Colorectal   [id number] (q14d):   A cycle is every 14 days:     Oxaliplatin        Dose Mod: None     Leucovorin        Dose Mod: None     5-Fluorouracil        Dose Mod: None     5-Fluorouracil        Dose Mod: None  **Always confirm dose/schedule in your pharmacy ordering system**  REASON: Disease Progression PRIOR TREATMENT: Off Pathway: FOLFOX (q14d) TREATMENT RESPONSE: Unable to Evaluate  START OFF PATHWAY REGIMEN - Colorectal   [id number] (Leucovorin IV D1 + Fluorouracil IV D1/CIV D1,2 + Oxaliplatin IV D1) q14 Days:   A cycle is every 14 days:     Oxaliplatin      Leucovorin      Fluorouracil      Fluorouracil   **Always confirm dose/schedule in your pharmacy ordering system**  Patient Characteristics: Distant Metastases, Nonsurgical Candidate, BRAF V600 Mutation Positive (KRAS/NRAS Wild-Type), Standard Cytotoxic/Targeted Therapy, First Line Standard Cytotoxic/Targeted Therapy Tumor Location: Colon Therapeutic Status: Distant Metastases Microsatellite/Mismatch Repair Status: MSS/pMMR BRAF Mutation Status: Mutation Positive KRAS/NRAS Mutation Status: Wild-Type (no mutation) Standard Cytotoxic/Targeted Line of Therapy: First Line Standard Cytotoxic/Targeted Therapy Intent of Therapy: Non-Curative / Palliative Intent, Discussed with Patient

## 2020-12-05 NOTE — Progress Notes (Signed)
[facility] OFFICE PROGRESS NOTE   Diagnosis: Colon cancer  INTERVAL HISTORY:   Mr. Dealmeida returns as scheduled.  He saw Dr. Reynaldo Minium on 11/28/2020.  Dr. Reynaldo Minium recommends FOLFOX chemotherapy. Mr. Benyo has no new complaint.  He recently took a vacation to [place].  Objective:  Vital signs in last 24 hours:  Blood pressure (!) 144/77, pulse 71, temperature 98.2 F (36.8 C), temperature source Oral, resp. rate 18, height _0  (1.93 m), weight 250 lb (113.4 kg), SpO2 100 %.    Lymphatics: 3 cm left axillary node, 5 cm left inguinal node.  No other cervical, supraclavicular, axillary, or inguinal nodes Resp: Lungs clear bilaterally Cardio: Regular rate and rhythm GI: No hepatosplenomegaly, nontender Vascular: No leg edema  Lab Results:  Lab Results  Component Value Date   WBC 5.1 10/11/2020   HGB 12.3 (L) 10/11/2020   HCT 38.5 (L) 10/11/2020   MCV 91.7 10/11/2020   PLT 161 10/11/2020   NEUTROABS 2.9 10/11/2020    CMP  Lab Results  Component Value Date   NA 136 10/11/2020   K 4.7 10/11/2020   CL 107 10/11/2020   CO2 19 (L) 10/11/2020   GLUCOSE 118 (H) 10/11/2020   BUN 38 (H) 10/11/2020   CREATININE 2.54 (H) 10/11/2020   CALCIUM 9.0 10/11/2020   PROT 7.0 01/08/2017   ALBUMIN 3.9 01/08/2017   AST 20 01/08/2017   ALT 20 01/08/2017   ALKPHOS 65 01/08/2017   BILITOT 0.6 01/08/2017   GFRNONAA 26 (L) 10/11/2020   GFRAA 28 (L) 09/20/2019    Lab Results  Component Value Date   CEA1 1.34 06/11/2020   CEA 5.0 (H) 04/13/2016    Lab Results  Component Value Date   INR 0.9 10/11/2020   LABPROT 12.6 10/11/2020    Imaging:  No results found.  Medications: I have reviewed the patient's current medications.   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), isolated mesenteric implant-resected, multiple tumor deposits Sigmoid/descending resection and creation of a descending colostomy 04/10/2016 MSI-stable, no loss of  mismatch repair protein expression Foundation 1- BRAF V600E positive, MS-stable, intermediate tumor mutation burden, no RAS mutation CT abdomen/pelvis 04/02/2016-no evidence of distant metastatic disease Cycle 1 FOLFOX 05/21/2016 Cycle 2 FOLFOX 06/04/2016 Cycle 3 FOLFOX 06/18/2016 Cycle 4 FOLFOX 07/02/2016 (oxaliplatin held secondary to thrombocytopenia) Cycle 5 FOLFOX 07/16/2016 Cycle 6 FOLFOX 07/30/2016 Cycle 7 FOLFOX 08/13/2016 (oxaliplatin held and 5-FU dose reduced) Cycle 8 FOLFOX 09/03/2016 (oxaliplatin held secondary to neuropathy) Cycle 9 FOLFOX 09/17/2016 (oxaliplatin held secondary to neuropathy) Cycle 10 FOLFOX 10/02/2016 Cycle 11 FOLFOX 10/15/2016 (oxaliplatin eliminated from the regimen) Cycle 12 FOLFOX 10/30/2016 (oxaliplatin eliminated) CTs 05/12/2017-no evidence of recurrent disease, right inguinal hernia containing bladder Colonoscopy 07/21/2017, 3 polyps were removed from the descending and transverse colon, fragments of tubular and tubulovillous adenoma CTs 05/19/2018-no evidence of recurrent disease, status post hernia repair, right upper lobe pneumonia CTs 05/20/2019-resolution of right upper lobe pneumonia, no evidence of metastatic disease Colonoscopy 01/25/2020-multiple polyps removed-tubular adenomas, poor preparation, repeat colonoscopy recommended CTs neck, chest, and abdomen/pelvis 08/27/2020- new 2 centimeter left axillary node, new 1.9 cm left inguinal node chronic mildly prominent portacaval node Ultrasound biopsy of left inguinal node on 10/29/2020-metastatic adenocarcinoma consistent with colon adenocarcinoma PET [id number] hypermetabolic left inguinal and left axillary nodes, solitary focus of hypermetabolic activity in the right lower quadrant small bowel   2.   Chronic renal insufficiency   3.   Hypertension   4.  Inflamed sebaceous cyst at the  upper back-status post incision and drainage   5.  Thrombocytopenia secondary chemotherapy-oxaliplatin dose  reduced beginning with cycle 3 FOLFOX, oxaliplatin held with cycle 4 and cycle 7 FOLFOX   6.  Oxaliplatin neuropathy-improved   7.  History of Mucositis secondary chemotherapy   8.  Symptoms of pneumonia January 2020-CT 05/19/2018 consistent with right upper lobe pneumonia, Levaquin prescribed 05/20/2018   9.  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  The palpable lymph nodes appear stable.  We discussed treatment options.  He agrees to proceed with FOLFOX.  The plan will be to deliver FOLFOX for 3 to 4 months prior to a restaging evaluation.  He has been treated with FOLFOX in the past.  We reviewed potential toxicities associated with this regimen including the chance of nausea/vomiting, mucositis, diarrhea, and hematologic toxicity.  We discussed the sun sensitivity, rash, hyperpigmentation, and hand/foot syndrome associated with 5-fluorouracil.  We reviewed the various types of neuropathy seen with oxaliplatin.  He understands the chance of developing recurrent/progressive neuropathy with a second course of oxaliplatin.  He denies neuropathy symptoms at present.  He agrees to proceed.  He will be referred for Port-A-Cath placement with the plan to begin FOLFOX on 12/19/2020.  He is scheduled to be out of town with work next week.  A chemotherapy plan was entered today.  He will return to the lab for a baseline CBC, chemistry panel, and CEA today.  Betsy Coder, MD  12/05/2020  11:03 AM

## 2020-12-06 LAB — CEA (IN HOUSE-CHCC): CEA (CHCC-In House): 2.34 ng/mL (ref 0.00–5.00)

## 2020-12-07 ENCOUNTER — Other Ambulatory Visit: Payer: Self-pay | Admitting: *Deleted

## 2020-12-07 MED ORDER — PROCHLORPERAZINE MALEATE 10 MG PO TABS: 10.0000 mg | ORAL_TABLET | Freq: Four times a day (QID) | ORAL | 1 refills | Status: DC | PRN

## 2020-12-07 MED ORDER — LIDOCAINE-PRILOCAINE 2.5-2.5 % EX CREA: 1.0000 "application " | TOPICAL_CREAM | CUTANEOUS | 2 refills | Status: DC | PRN

## 2020-12-07 MED ORDER — ONDANSETRON HCL 8 MG PO TABS: 8.0000 mg | ORAL_TABLET | Freq: Three times a day (TID) | ORAL | 0 refills | Status: DC | PRN

## 2020-12-07 NOTE — Progress Notes (Signed)
Sent scripts for EMLA, Zofran and Compazine to his pharmacy.

## 2020-12-10 ENCOUNTER — Ambulatory Visit: Payer: Medicare Other | Admitting: Nurse Practitioner

## 2020-12-10 ENCOUNTER — Inpatient Hospital Stay: Payer: Medicare Other

## 2020-12-10 ENCOUNTER — Other Ambulatory Visit: Payer: Medicare Other

## 2020-12-12 ENCOUNTER — Other Ambulatory Visit: Payer: Self-pay | Admitting: *Deleted

## 2020-12-12 DIAGNOSIS — C189 Malignant neoplasm of colon, unspecified: Secondary | ICD-10-CM

## 2020-12-13 ENCOUNTER — Telehealth: Payer: Self-pay

## 2020-12-13 NOTE — Telephone Encounter (Signed)
VM message left from [facility] with [facility] with prior authorization approval for Ondansetron 8 mg with approval from 12/13/20-12/13/21 office number [phone]

## 2020-12-14 ENCOUNTER — Other Ambulatory Visit: Payer: Self-pay | Admitting: Radiology

## 2020-12-15 ENCOUNTER — Other Ambulatory Visit: Payer: Self-pay | Admitting: Oncology

## 2020-12-17 ENCOUNTER — Encounter (HOSPITAL_COMMUNITY): Payer: Self-pay

## 2020-12-17 ENCOUNTER — Other Ambulatory Visit: Payer: Self-pay

## 2020-12-17 ENCOUNTER — Ambulatory Visit (HOSPITAL_COMMUNITY)
Admission: RE | Admit: 2020-12-17 | Discharge: 2020-12-17 | Disposition: A | Discharge status: Home / Self Care | Payer: Medicare Other | Source: Ambulatory Visit | Attending: Oncology | Admitting: Oncology

## 2020-12-17 ENCOUNTER — Other Ambulatory Visit: Payer: Self-pay | Admitting: Oncology

## 2020-12-17 DIAGNOSIS — N182 Chronic kidney disease, stage 2 (mild): Secondary | ICD-10-CM | POA: Diagnosis not present

## 2020-12-17 DIAGNOSIS — Z79899 Other long term (current) drug therapy: Secondary | ICD-10-CM | POA: Diagnosis not present

## 2020-12-17 DIAGNOSIS — D696 Thrombocytopenia, unspecified: Secondary | ICD-10-CM | POA: Insufficient documentation

## 2020-12-17 DIAGNOSIS — C189 Malignant neoplasm of colon, unspecified: Secondary | ICD-10-CM

## 2020-12-17 DIAGNOSIS — I447 Left bundle-branch block, unspecified: Secondary | ICD-10-CM | POA: Diagnosis not present

## 2020-12-17 DIAGNOSIS — Z452 Encounter for adjustment and management of vascular access device: Secondary | ICD-10-CM | POA: Diagnosis not present

## 2020-12-17 DIAGNOSIS — N184 Chronic kidney disease, stage 4 (severe): Secondary | ICD-10-CM | POA: Diagnosis not present

## 2020-12-17 DIAGNOSIS — I129 Hypertensive chronic kidney disease with stage 1 through stage 4 chronic kidney disease, or unspecified chronic kidney disease: Secondary | ICD-10-CM | POA: Diagnosis not present

## 2020-12-17 HISTORY — PX: IR IMAGING GUIDED PORT INSERTION: IMG5740

## 2020-12-17 MED ORDER — FENTANYL CITRATE (PF) 100 MCG/2ML IJ SOLN
INTRAMUSCULAR | Status: AC | PRN
  Administered 2020-12-17: 50 ug via INTRAVENOUS

## 2020-12-17 MED ORDER — FENTANYL CITRATE (PF) 100 MCG/2ML IJ SOLN
INTRAMUSCULAR | Status: AC
  Filled 2020-12-17: qty 2

## 2020-12-17 MED ORDER — LIDOCAINE-EPINEPHRINE 1 %-1:100000 IJ SOLN
INTRAMUSCULAR | Status: AC
  Filled 2020-12-17: qty 1

## 2020-12-17 MED ORDER — HEPARIN SOD (PORK) LOCK FLUSH 100 UNIT/ML IV SOLN
INTRAVENOUS | Status: AC
  Filled 2020-12-17: qty 5

## 2020-12-17 MED ORDER — MIDAZOLAM HCL 2 MG/2ML IJ SOLN
INTRAMUSCULAR | Status: AC
  Filled 2020-12-17: qty 2

## 2020-12-17 MED ORDER — MIDAZOLAM HCL 2 MG/2ML IJ SOLN
INTRAMUSCULAR | Status: AC | PRN
  Administered 2020-12-17: 1 mg via INTRAVENOUS

## 2020-12-17 NOTE — H&P (Signed)
Chief Complaint: Patient was seen in consultation today for port-a-cath placement  at the request of Sherrill,Gary B  Referring Physician(s): Ladell Pier  Supervising Physician: Jacqulynn Cadet  Patient Status: [facility] - Out-pt  History of Present Illness: Louis [name] is a 75 y.o. male w/ hx of stage IV sigmoid colon cancer,CKD, HTN, LBBB, and thrombocytopenia. He presents today for port-a-cath placement to receive his chemotherapy.   Past Medical History:  Diagnosis Date   Allergy    seasonal allergies   Arthritis 2010   Ascending aorta dilatation (HCC)    4.1 cm noted on CT1/15/21   CKD (chronic kidney disease) stage 2, GFR 60-89 ml/min    see [place], Stage 1   colon ca dx'd 04/2016   colon   Hernia, abdominal    sx fixed   Hyperlipidemia    on meds   Hypertension    on meds   LBBB (left bundle branch block) 08/18/2019   Neuropathy    Pneumonia 05/19/2018   Seasonal allergies    Thrombocytopenia (HCC)    during chemo.    Past Surgical History:  Procedure Laterality Date   BIOPSY  04/10/2016   Procedure: BIOPSY Mesentary;  Surgeon: Judeth Horn, MD;  Location: [place];  Service: General;;   COLONOSCOPY  2019   TA/tubulovillous adenoma   COLOSTOMY  2018   reversed   COLOSTOMY REVERSAL N/A 01/12/2017   Procedure: COLOSTOMY REVERSAL;  Surgeon: Judeth Horn, MD;  Location: [place];  Service: General;  Laterality: N/A;   FLEXIBLE SIGMOIDOSCOPY N/A 04/09/2016   Procedure: FLEXIBLE SIGMOIDOSCOPY;  Surgeon: Ladene Artist, MD;  Location: [facility] ENDOSCOPY;  Service: Endoscopy;  Laterality: N/A;   HERNIA REPAIR     in childhood   INGUINAL HERNIA REPAIR Right 03/29/2018   Procedure: OPEN REPAIR OF RIGHT INGUINAL HERNIA WITH MESH;  Surgeon: Judeth Horn, MD;  Location: [place];  Service: General;  Laterality: Right;   INSERTION OF MESH N/A 03/29/2018   Procedure: INSERTION OF MESH;  Surgeon: Judeth Horn, MD;  Location: [place];  Service: General;  Laterality: N/A;    IR GENERIC HISTORICAL  05/14/2016   IR US GUIDE VASC ACCESS RIGHT 05/14/2016 Corrie Mckusick, DO [facility]-INTERV RAD   IR GENERIC HISTORICAL  05/14/2016   IR FLUORO GUIDE PORT INSERTION RIGHT 05/14/2016 Corrie Mckusick, DO [facility]   IR REMOVAL TUN ACCESS W/ PORT W/O FL MOD SED  11/11/2016   PARTIAL COLECTOMY N/A 04/10/2016   Procedure: SIGMOID  COLECTOMY WITH COLOSTOMY;  Surgeon: Judeth Horn, MD;  Location: [facility];  Service: General;  Laterality: N/A;   TONSILLECTOMY     TOTAL KNEE ARTHROPLASTY Left 09/29/2019   Procedure: TOTAL KNEE ARTHROPLASTY;  Surgeon: Paralee Cancel, MD;  Location: [facility];  Service: Orthopedics;  Laterality: Left;  70 mins    Allergies: Patient has no known allergies.  Medications: Prior to Admission medications   Medication Sig Start Date End Date Taking? Authorizing Provider  amLODipine (NORVASC) 10 MG tablet Take 10 mg by mouth daily. 02/11/16  Yes [provider]  labetalol (NORMODYNE) 200 MG tablet Take 200 mg by mouth 3 (three) times daily.  04/23/16  Yes [provider]  loratadine (CLARITIN) 10 MG tablet Take 10 mg by mouth daily as needed for allergies.    Yes [provider]  losartan (COZAAR) 25 MG tablet Take 25 mg by mouth daily. 04/18/20  Yes [provider]  Omega-3 Fatty Acids (FISH OIL) 1200 MG CAPS Take 3,600 mg  by mouth daily.    Yes [provider]  lidocaine-prilocaine (EMLA) cream Apply 1 application topically as needed. Apply 1/2 tablespoon to port site 2 hours prior to stick and cover with plastic wrap to numb site. DO NOT USE UNTIL 14 DAYS AFTER PORT IS PLACED. 12/07/20   Ladell Pier, MD  ondansetron (ZOFRAN) 8 MG tablet Take 1 tablet (8 mg total) by mouth every 8 (eight) hours as needed for nausea or vomiting. 12/07/20   Ladell Pier, MD  prochlorperazine (COMPAZINE) 10 MG tablet Take 1 tablet (10 mg total) by mouth every 6 (six) hours as needed for nausea or vomiting. 12/07/20   Ladell Pier, MD      Family History  Problem Relation Age of Onset   Hyperlipidemia Father    Diverticulitis Brother    Colon cancer Neg Hx    Esophageal cancer Neg Hx    Rectal cancer Neg Hx    Stomach cancer Neg Hx    Colon polyps Neg Hx     Social History   Socioeconomic History   Marital status: Married    Spouse name: Not on file   Number of children: Not on file   Years of education: Not on file   Highest education level: Not on file  Occupational History   Not on file  Tobacco Use   Smoking status: Never   Smokeless tobacco: Never  Vaping Use   Vaping Use: Never used  Substance and Sexual Activity   Alcohol use: Yes    Alcohol/week: 3.0 - 4.0 standard drinks    Types: 3 - 4 Glasses of wine per week    Comment: 3-4 glasses of wine per week   Drug use: No   Sexual activity: Not on file  Other Topics Concern   Not on file  Social History Narrative   Married, wife [name]   Has #2 daughters and #2 brothers   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  Respiratory:  Negative for shortness of breath.   Cardiovascular:  Negative for chest pain.  Gastrointestinal:  Negative for abdominal pain, nausea and vomiting.   Vital Signs: BP (!) 155/87   Pulse 65   Ht 6\' 4"  (1.93 m)   Wt 250 lb (113.4 kg)   SpO2 99%   BMI 30.43 kg/m   Physical Exam Constitutional:      Appearance: He is obese.  HENT:     Mouth/Throat:     Mouth: Mucous membranes are moist.     Pharynx: Oropharynx is clear.  Cardiovascular:     Rate and Rhythm: Normal rate and regular rhythm.     Heart sounds: Normal heart sounds.  Pulmonary:     Effort: Pulmonary effort is normal.     Breath sounds: Normal  breath sounds.  Abdominal:     Palpations: Abdomen is soft.     Tenderness: There is no abdominal tenderness. There is no guarding.  Skin:    General: Skin is warm and dry.  Neurological:     Mental Status: He is alert.  Psychiatric:        Mood and Affect: Mood normal.  Behavior: Behavior normal.        Thought Content: Thought content normal.        Judgment: Judgment normal.    Imaging: No results found.  Labs:  CBC: Recent Labs    10/11/20 1205 12/05/20 1112  WBC 5.1 4.5  HGB 12.3* 11.2*  HCT 38.5* 35.3*  PLT 161 164    COAGS: Recent Labs    10/11/20 1205  INR 0.9    BMP: Recent Labs    10/11/20 1205 12/05/20 1112  NA 136 141  K 4.7 4.4  CL 107 111  CO2 19* 20*  GLUCOSE 118* 105*  BUN 38* 36*  CALCIUM 9.0 9.1  CREATININE 2.54* 2.34*  GFRNONAA 26* 28*    LIVER FUNCTION TESTS: Recent Labs    12/05/20 1112  BILITOT 0.3  AST 9*  ALT 9  ALKPHOS 72  PROT 7.1  ALBUMIN 4.0    TUMOR MARKERS: Recent Labs    12/05/20 1112  CEA 3.24    Assessment and Plan: Hx of stage IV sigmoid colon cancer,CKD, HTN, LBBB, and thrombocytopenia. He presents today for port-a-cath placement to receive his chemotherapy.   Risks and benefits of image guided port-a-catheter placement was discussed with the patient including, but not limited to bleeding, infection, pneumothorax, or fibrin sheath development and need for additional procedures.  All of the patient's questions were answered, patient is agreeable to proceed. Consent signed and in chart.    Thank you for this interesting consult.  I greatly enjoyed meeting Louis [name] and look forward to participating in their care.  A copy of this report was sent to the requesting provider on this date.  Electronically Signed: Tyson Alias, NP 12/17/2020, 10:42 AM   I spent a total of 30 Minutes  in face to face in clinical consultation, greater than 50% of which was counseling/coordinating care for  port-a-cath placement.

## 2020-12-17 NOTE — Procedures (Signed)
Interventional Radiology Procedure Note  Procedure: Placement of a right IJ approach single lumen PowerPort.  Tip is positioned at the superior cavoatrial junction and catheter is ready for immediate use.   Complications: No immediate  EBL: None  Recommendations:  - Ok to shower tomorrow - Do not submerge for 7 days - Routine line care    Signed,  Taylan Marez K. Velina Drollinger, MD   

## 2020-12-17 NOTE — Progress Notes (Signed)
Pt ambulated without difficulty or bleeding.   Discharged home with his wife who will drive and stay with pt x 24 hrs. 

## 2020-12-19 ENCOUNTER — Inpatient Hospital Stay: Payer: Medicare Other

## 2020-12-19 ENCOUNTER — Other Ambulatory Visit: Payer: Self-pay | Admitting: Oncology

## 2020-12-19 ENCOUNTER — Other Ambulatory Visit: Payer: Self-pay

## 2020-12-19 VITALS — BP 127/79 | HR 60 | Temp 98.5°F | Resp 18 | Ht 76.0 in | Wt 252.2 lb

## 2020-12-19 DIAGNOSIS — D6959 Other secondary thrombocytopenia: Secondary | ICD-10-CM | POA: Diagnosis not present

## 2020-12-19 DIAGNOSIS — C774 Secondary and unspecified malignant neoplasm of inguinal and lower limb lymph nodes: Secondary | ICD-10-CM | POA: Diagnosis not present

## 2020-12-19 DIAGNOSIS — G620 Drug-induced polyneuropathy: Secondary | ICD-10-CM | POA: Diagnosis not present

## 2020-12-19 DIAGNOSIS — Z5111 Encounter for antineoplastic chemotherapy: Secondary | ICD-10-CM | POA: Diagnosis not present

## 2020-12-19 DIAGNOSIS — N189 Chronic kidney disease, unspecified: Secondary | ICD-10-CM | POA: Diagnosis not present

## 2020-12-19 DIAGNOSIS — C189 Malignant neoplasm of colon, unspecified: Secondary | ICD-10-CM

## 2020-12-19 DIAGNOSIS — C187 Malignant neoplasm of sigmoid colon: Secondary | ICD-10-CM | POA: Diagnosis not present

## 2020-12-19 LAB — CBC WITH DIFFERENTIAL (CANCER CENTER ONLY)
Abs Immature Granulocytes: 0.02 10*3/uL (ref 0.00–0.07)
Basophils Absolute: 0 10*3/uL (ref 0.0–0.1)
Basophils Relative: 1 %
Eosinophils Absolute: 0.3 10*3/uL (ref 0.0–0.5)
Eosinophils Relative: 6 %
HCT: 30.5 % — ABNORMAL LOW (ref 39.0–52.0)
Hemoglobin: 9.6 g/dL — ABNORMAL LOW (ref 13.0–17.0)
Immature Granulocytes: 1 %
Lymphocytes Relative: 21 %
Lymphs Abs: 0.9 10*3/uL (ref 0.7–4.0)
MCH: 27.1 pg (ref 26.0–34.0)
MCHC: 31.5 g/dL (ref 30.0–36.0)
MCV: 86.2 fL (ref 80.0–100.0)
Monocytes Absolute: 0.5 10*3/uL (ref 0.1–1.0)
Monocytes Relative: 12 %
Neutro Abs: 2.6 10*3/uL (ref 1.7–7.7)
Neutrophils Relative %: 59 %
Platelet Count: 171 10*3/uL (ref 150–400)
RBC: 3.54 MIL/uL — ABNORMAL LOW (ref 4.22–5.81)
RDW: 14.2 % (ref 11.5–15.5)
WBC Count: 4.3 10*3/uL (ref 4.0–10.5)
nRBC: 0 % (ref 0.0–0.2)

## 2020-12-19 LAB — CMP (CANCER CENTER ONLY)
ALT: 10 U/L (ref 0–44)
AST: 8 U/L — ABNORMAL LOW (ref 15–41)
Albumin: 3.7 g/dL (ref 3.5–5.0)
Alkaline Phosphatase: 72 U/L (ref 38–126)
Anion gap: 8 (ref 5–15)
BUN: 37 mg/dL — ABNORMAL HIGH (ref 8–23)
CO2: 24 mmol/L (ref 22–32)
Calcium: 8.5 mg/dL — ABNORMAL LOW (ref 8.9–10.3)
Chloride: 109 mmol/L (ref 98–111)
Creatinine: 2.19 mg/dL — ABNORMAL HIGH (ref 0.61–1.24)
GFR, Estimated: 31 mL/min — ABNORMAL LOW (ref >60)
Glucose, Bld: 160 mg/dL — ABNORMAL HIGH (ref 70–99)
Potassium: 4.2 mmol/L (ref 3.5–5.1)
Sodium: 141 mmol/L (ref 135–145)
Total Bilirubin: 0.3 mg/dL (ref 0.3–1.2)
Total Protein: 6.2 g/dL — ABNORMAL LOW (ref 6.5–8.1)

## 2020-12-19 MED ORDER — SODIUM CHLORIDE 0.9 % IV SOLN
10.0000 mg | Freq: Once | INTRAVENOUS | Status: AC
  Administered 2020-12-19: 10 mg via INTRAVENOUS
  Filled 2020-12-19: qty 1

## 2020-12-19 MED ORDER — LEUCOVORIN CALCIUM INJECTION 350 MG
300.0000 mg/m2 | Freq: Once | INTRAVENOUS | Status: AC
  Administered 2020-12-19: 742 mg via INTRAVENOUS
  Filled 2020-12-19: qty 37.1

## 2020-12-19 MED ORDER — OXALIPLATIN CHEMO INJECTION 100 MG/20ML
65.0000 mg/m2 | Freq: Once | INTRAVENOUS | Status: AC
  Administered 2020-12-19: 160 mg via INTRAVENOUS
  Filled 2020-12-19: qty 32

## 2020-12-19 MED ORDER — DEXTROSE 5 % IV SOLN: Freq: Once | INTRAVENOUS | Status: AC

## 2020-12-19 MED ORDER — FLUOROURACIL CHEMO INJECTION 2.5 GM/50ML
300.0000 mg/m2 | Freq: Once | INTRAVENOUS | Status: AC
  Administered 2020-12-19: 750 mg via INTRAVENOUS
  Filled 2020-12-19: qty 15

## 2020-12-19 MED ORDER — PALONOSETRON HCL INJECTION 0.25 MG/5ML
0.2500 mg | Freq: Once | INTRAVENOUS | Status: AC
  Administered 2020-12-19: 0.25 mg via INTRAVENOUS
  Filled 2020-12-19: qty 5

## 2020-12-19 MED ORDER — SODIUM CHLORIDE 0.9 % IV SOLN
1800.0000 mg/m2 | INTRAVENOUS | Status: DC
  Administered 2020-12-19: 4450 mg via INTRAVENOUS
  Filled 2020-12-19: qty 89

## 2020-12-19 NOTE — Progress Notes (Signed)
Per Dr. Benay Spice: okay to treat with Scr of 2.19

## 2020-12-19 NOTE — Patient Instructions (Signed)
[name]   Discharge Instructions: Thank you for choosing [facility] to provide your oncology and hematology care.   If you have a lab appointment with the [facility], please go directly to the [facility] and check in at the registration area.   Wear comfortable clothing and clothing appropriate for easy access to any Portacath or PICC line.   We strive to give you quality time with your provider. You may need to reschedule your appointment if you arrive late (15 or more minutes).  Arriving late affects you and other patients whose appointments are after yours.  Also, if you miss three or more appointments without notifying the office, you may be dismissed from the clinic at the provider's discretion.      For prescription refill requests, have your pharmacy contact our office and allow 72 hours for refills to be completed.    Today you received the following chemotherapy and/or immunotherapy agents Oxaliplatin (ELOXATIN), Leucovorin & Flourouracil (ADRUCIL).      To help prevent nausea and vomiting after your treatment, we encourage you to take your nausea medication as directed.  BELOW ARE SYMPTOMS THAT SHOULD BE REPORTED IMMEDIATELY: *FEVER GREATER THAN 100.4 F (38 C) OR HIGHER *CHILLS OR SWEATING *NAUSEA AND VOMITING THAT IS NOT CONTROLLED WITH YOUR NAUSEA MEDICATION *UNUSUAL SHORTNESS OF BREATH *UNUSUAL BRUISING OR BLEEDING *URINARY PROBLEMS (pain or burning when urinating, or frequent urination) *BOWEL PROBLEMS (unusual diarrhea, constipation, pain near the anus) TENDERNESS IN MOUTH AND THROAT WITH OR WITHOUT PRESENCE OF ULCERS (sore throat, sores in mouth, or a toothache) UNUSUAL RASH, SWELLING OR PAIN  UNUSUAL VAGINAL DISCHARGE OR ITCHING   Items with * indicate a potential emergency and should be followed up as soon as possible or go to the Emergency Department if any problems should occur.  Please show the CHEMOTHERAPY ALERT  CARD or IMMUNOTHERAPY ALERT CARD at check-in to the Emergency Department and triage nurse.  Should you have questions after your visit or need to cancel or reschedule your appointment, please contact [facility]  Dept: [phone]  and follow the prompts.  Office hours are 8:00 a.m. to 4:30 p.m. Monday - Friday. Please note that voicemails left after 4:00 p.m. may not be returned until the following business day.  We are closed weekends and major holidays. You have access to a nurse at all times for urgent questions. Please call the main number to the clinic Dept: [phone] and follow the prompts.   For any non-urgent questions, you may also contact your provider using MyChart. We now offer e-Visits for anyone 50 and older to request care online for non-urgent symptoms. For details visit [email or web address].   Also download the MyChart app! Go to the app store, search "MyChart", open the app, select [facility], and log in with your MyChart username and password.  Due to Covid, a mask is required upon entering the hospital/clinic. If you do not have a mask, one will be given to you upon arrival. For doctor visits, patients may have 1 support person aged 68 or older with them. For treatment visits, patients cannot have anyone with them due to current Covid guidelines and our immunocompromised population.   Oxaliplatin Injection What is this medication? OXALIPLATIN (ox AL i PLA tin) is a chemotherapy drug. It targets fast dividing cells, like cancer cells, and causes these cells to die. This medicine is usedto treat cancers of the colon and rectum, and many  other cancers. This medicine may be used for other purposes; ask your health care provider orpharmacist if you have questions. COMMON BRAND NAME(S): Eloxatin What should I tell my care team before I take this medication? They need to know if you have any of these conditions: heart disease history of irregular  heartbeat liver disease low blood counts, like white cells, platelets, or red blood cells lung or breathing disease, like asthma take medicines that treat or prevent blood clots tingling of the fingers or toes, or other nerve disorder an unusual or allergic reaction to oxaliplatin, other chemotherapy, other medicines, foods, dyes, or preservatives pregnant or trying to get pregnant breast-feeding How should I use this medication? This drug is given as an infusion into a vein. It is administered in a hospitalor clinic by a specially trained health care professional. Talk to your pediatrician regarding the use of this medicine in children.Special care may be needed. Overdosage: If you think you have taken too much of this medicine contact apoison control center or emergency room at once. NOTE: This medicine is only for you. Do not share this medicine with others. What if I miss a dose? It is important not to miss a dose. Call your doctor or health careprofessional if you are unable to keep an appointment. What may interact with this medication? Do not take this medicine with any of the following medications: cisapride dronedarone pimozide thioridazine This medicine may also interact with the following medications: aspirin and aspirin-like medicines certain medicines that treat or prevent blood clots like warfarin, apixaban, dabigatran, and rivaroxaban cisplatin cyclosporine diuretics medicines for infection like acyclovir, adefovir, amphotericin B, bacitracin, cidofovir, foscarnet, ganciclovir, gentamicin, pentamidine, vancomycin NSAIDs, medicines for pain and inflammation, like ibuprofen or naproxen other medicines that prolong the QT interval (an abnormal heart rhythm) pamidronate zoledronic acid This list may not describe all possible interactions. Give your health care provider a list of all the medicines, herbs, non-prescription drugs, or dietary supplements you use. Also tell  them if you smoke, drink alcohol, or use illegaldrugs. Some items may interact with your medicine. What should I watch for while using this medication? Your condition will be monitored carefully while you are receiving thismedicine. You may need blood work done while you are taking this medicine. This medicine may make you feel generally unwell. This is not uncommon as chemotherapy can affect healthy cells as well as cancer cells. Report any side effects. Continue your course of treatment even though you feel ill unless yourhealthcare professional tells you to stop. This medicine can make you more sensitive to cold. Do not drink cold drinks or use ice. Cover exposed skin before coming in contact with cold temperatures or cold objects. When out in cold weather wear warm clothing and cover your mouth and nose to warm the air that goes into your lungs. Tell your doctor if you getsensitive to the cold. Do not become pregnant while taking this medicine or for 9 months after stopping it. Women should inform their health care professional if they wish to become pregnant or think they might be pregnant. Men should not father a child while taking this medicine and for 6 months after stopping it. There is potential for serious side effects to an unborn child. Talk to your health careprofessional for more information. Do not breast-feed a child while taking this medicine or for 3 months afterstopping it. This medicine has caused ovarian failure in some women. This medicine may make it more difficult to get pregnant.  Talk to your health care professional if [place] concerned about your fertility. This medicine has caused decreased sperm counts in some men. This may make it more difficult to father a child. Talk to your health care professional if [place] concerned about your fertility. This medicine may increase your risk of getting an infection. Call your health care professional for advice if you get a fever, chills,  or sore throat, or other symptoms of a cold or flu. Do not treat yourself. Try to avoid beingaround people who are sick. Avoid taking medicines that contain aspirin, acetaminophen, ibuprofen, naproxen, or ketoprofen unless instructed by your health care professional.These medicines may hide a fever. Be careful brushing or flossing your teeth or using a toothpick because you may get an infection or bleed more easily. If you have any dental work done, Primary school teacher you are receiving this medicine. What side effects may I notice from receiving this medication? Side effects that you should report to your doctor or health care professionalas soon as possible: allergic reactions like skin rash, itching or hives, swelling of the face, lips, or tongue breathing problems cough low blood counts - this medicine may decrease the number of white blood cells, red blood cells, and platelets. You may be at increased risk for infections and bleeding nausea, vomiting pain, redness, or irritation at site where injected pain, tingling, numbness in the hands or feet signs and symptoms of bleeding such as bloody or black, tarry stools; red or dark brown urine; spitting up blood or brown material that looks like coffee grounds; red spots on the skin; unusual bruising or bleeding from the eyes, gums, or nose signs and symptoms of a dangerous change in heartbeat or heart rhythm like chest pain; dizziness; fast, irregular heartbeat; palpitations; feeling faint or lightheaded; falls signs and symptoms of infection like fever; chills; cough; sore throat; pain or trouble passing urine signs and symptoms of liver injury like dark yellow or brown urine; general ill feeling or flu-like symptoms; light-colored stools; loss of appetite; nausea; right upper belly pain; unusually weak or tired; yellowing of the eyes or skin signs and symptoms of low red blood cells or anemia such as unusually weak or tired; feeling faint or  lightheaded; falls signs and symptoms of muscle injury like dark urine; trouble passing urine or change in the amount of urine; unusually weak or tired; muscle pain; back pain Side effects that usually do not require medical attention (report to yourdoctor or health care professional if they continue or are bothersome): changes in taste diarrhea gas hair loss loss of appetite mouth sores This list may not describe all possible side effects. Call your doctor for medical advice about side effects. You may report side effects to FDA at1-800-FDA-1088. Where should I keep my medication? This drug is given in a hospital or clinic and will not be stored at home. NOTE: This sheet is a summary. It may not cover all possible information. If you have questions about this medicine, talk to your doctor, pharmacist, orhealth care provider.  2022 Elsevier/Gold Standard (2018-09-08 12:20:35)  Leucovorin injection What is this medication? LEUCOVORIN (loo koe VOR in) is used to prevent or treat the harmful effects of some medicines. This medicine is used to treat anemia caused by a low amount of folic acid in the body. It is also used with 5-fluorouracil (5-FU) to treatcolon cancer. This medicine may be used for other purposes; ask your health care provider orpharmacist if you have questions. What should I  tell my care team before I take this medication? They need to know if you have any of these conditions: anemia from low levels of vitamin B-12 in the blood an unusual or allergic reaction to leucovorin, folic acid, other medicines, foods, dyes, or preservatives pregnant or trying to get pregnant breast-feeding How should I use this medication? This medicine is for injection into a muscle or into a vein. It is given by ahealth care professional in a hospital or clinic setting. Talk to your pediatrician regarding the use of this medicine in children.Special care may be needed. Overdosage: If you think you  have taken too much of this medicine contact apoison control center or emergency room at once. NOTE: This medicine is only for you. Do not share this medicine with others. What if I miss a dose? This does not apply. What may interact with this medication? capecitabine fluorouracil phenobarbital phenytoin primidone trimethoprim-sulfamethoxazole This list may not describe all possible interactions. Give your health care provider a list of all the medicines, herbs, non-prescription drugs, or dietary supplements you use. Also tell them if you smoke, drink alcohol, or use illegaldrugs. Some items may interact with your medicine. What should I watch for while using this medication? Your condition will be monitored carefully while you are receiving thismedicine. This medicine may increase the side effects of 5-fluorouracil, 5-FU. Tell your doctor or health care professional if you have diarrhea or mouth sores that donot get better or that get worse. What side effects may I notice from receiving this medication? Side effects that you should report to your doctor or health care professionalas soon as possible: allergic reactions like skin rash, itching or hives, swelling of the face, lips, or tongue breathing problems fever, infection mouth sores unusual bleeding or bruising unusually weak or tired Side effects that usually do not require medical attention (report to yourdoctor or health care professional if they continue or are bothersome): constipation or diarrhea loss of appetite nausea, vomiting This list may not describe all possible side effects. Call your doctor for medical advice about side effects. You may report side effects to FDA at1-800-FDA-1088. Where should I keep my medication? This drug is given in a hospital or clinic and will not be stored at home. NOTE: This sheet is a summary. It may not cover all possible information. If you have questions about this medicine, talk to your  doctor, pharmacist, orhealth care provider.  2022 Elsevier/Gold Standard (2007-10-26 16:50:29)  Fluorouracil, 5-FU injection What is this medication? FLUOROURACIL, 5-FU (flure oh YOOR a sil) is a chemotherapy drug. It slows the growth of cancer cells. This medicine is used to treat many types of cancer like breast cancer, colon or rectal cancer, pancreatic cancer, and stomachcancer. This medicine may be used for other purposes; ask your health care provider orpharmacist if you have questions. COMMON BRAND NAME(S): Adrucil What should I tell my care team before I take this medication? They need to know if you have any of these conditions: blood disorders dihydropyrimidine dehydrogenase (DPD) deficiency infection (especially a virus infection such as chickenpox, cold sores, or herpes) kidney disease liver disease malnourished, poor nutrition recent or ongoing radiation therapy an unusual or allergic reaction to fluorouracil, other chemotherapy, other medicines, foods, dyes, or preservatives pregnant or trying to get pregnant breast-feeding How should I use this medication? This drug is given as an infusion or injection into a vein. It is administeredin a hospital or clinic by a specially trained health care professional. Talk to  your pediatrician regarding the use of this medicine in children.Special care may be needed. Overdosage: If you think you have taken too much of this medicine contact apoison control center or emergency room at once. NOTE: This medicine is only for you. Do not share this medicine with others. What if I miss a dose? It is important not to miss your dose. Call your doctor or health careprofessional if you are unable to keep an appointment. What may interact with this medication? Do not take this medicine with any of the following medications: live virus vaccines This medicine may also interact with the following medications: medicines that treat or prevent blood  clots like warfarin, enoxaparin, and dalteparin This list may not describe all possible interactions. Give your health care provider a list of all the medicines, herbs, non-prescription drugs, or dietary supplements you use. Also tell them if you smoke, drink alcohol, or use illegaldrugs. Some items may interact with your medicine. What should I watch for while using this medication? Visit your doctor for checks on your progress. This drug may make you feel generally unwell. This is not uncommon, as chemotherapy can affect healthy cells as well as cancer cells. Report any side effects. Continue your course oftreatment even though you feel ill unless your doctor tells you to stop. In some cases, you may be given additional medicines to help with side effects.Follow all directions for their use. Call your doctor or health care professional for advice if you get a fever, chills or sore throat, or other symptoms of a cold or flu. Do not treat yourself. This drug decreases your body's ability to fight infections. Try toavoid being around people who are sick. This medicine may increase your risk to bruise or bleed. Call your doctor orhealth care professional if you notice any unusual bleeding. Be careful brushing and flossing your teeth or using a toothpick because you may get an infection or bleed more easily. If you have any dental work done,tell your dentist you are receiving this medicine. Avoid taking products that contain aspirin, acetaminophen, ibuprofen, naproxen, or ketoprofen unless instructed by your doctor. These medicines may hide afever. Do not become pregnant while taking this medicine. Women should inform their doctor if they wish to become pregnant or think they might be pregnant. There is a potential for serious side effects to an unborn child. Talk to your health care professional or pharmacist for more information. Do not breast-feed aninfant while taking this medicine. Men should inform  their doctor if they wish to father a child. This medicinemay lower sperm counts. Do not treat diarrhea with over the counter products. Contact your doctor ifyou have diarrhea that lasts more than 2 days or if it is severe and watery. This medicine can make you more sensitive to the sun. Keep out of the sun. If you cannot avoid being in the sun, wear protective clothing and use sunscreen.Do not use sun lamps or tanning beds/booths. What side effects may I notice from receiving this medication? Side effects that you should report to your doctor or health care professionalas soon as possible: allergic reactions like skin rash, itching or hives, swelling of the face, lips, or tongue low blood counts - this medicine may decrease the number of white blood cells, red blood cells and platelets. You may be at increased risk for infections and bleeding. signs of infection - fever or chills, cough, sore throat, pain or difficulty passing urine signs of decreased platelets or bleeding - bruising, pinpoint  red spots on the skin, black, tarry stools, blood in the urine signs of decreased red blood cells - unusually weak or tired, fainting spells, lightheadedness breathing problems changes in vision chest pain mouth sores nausea and vomiting pain, swelling, redness at site where injected pain, tingling, numbness in the hands or feet redness, swelling, or sores on hands or feet stomach pain unusual bleeding Side effects that usually do not require medical attention (report to yourdoctor or health care professional if they continue or are bothersome): changes in finger or toe nails diarrhea dry or itchy skin hair loss headache loss of appetite sensitivity of eyes to the light stomach upset unusually teary eyes This list may not describe all possible side effects. Call your doctor for medical advice about side effects. You may report side effects to FDA at1-800-FDA-1088. Where should I keep my  medication? This drug is given in a hospital or clinic and will not be stored at home. NOTE: This sheet is a summary. It may not cover all possible information. If you have questions about this medicine, talk to your doctor, pharmacist, orhealth care provider.  2022 Elsevier/Gold Standard (2019-03-22 15:00:03)  The chemotherapy medication bag should finish at 46 hours, 96 hours, or 7 days. For example, if your pump is scheduled for 46 hours and it was put on at 4:00 p.m., it should finish at 2:00 p.m. the day it is scheduled to come off regardless of your appointment time.     Estimated time to finish at 12:30 p.m. on Friday 12/21/2020.   If the display on your pump reads "Low Volume" and it is beeping, take the batteries out of the pump and come to the cancer center for it to be taken off.   If the pump alarms go off prior to the pump reading "Low Volume" then call [phone] and someone can assist you.  If the plunger comes out and the chemotherapy medication is leaking out, please use your home chemo spill kit to clean up the spill. Do NOT use paper towels or other household products.  If you have problems or questions regarding your pump, please call either [phone] (24 hours a day) or the cancer center Monday-Friday 8:00 a.m.- 4:30 p.m. at the clinic number and we will assist you. If you are unable to get assistance, then go to the nearest Emergency Department and ask the staff to contact the IV team for assistance.

## 2020-12-20 ENCOUNTER — Telehealth: Payer: Self-pay

## 2020-12-20 NOTE — Telephone Encounter (Signed)
24 Hr. Call Back  Telephone call to patient post his Folfox treatment on 12/19/2020. Per patient "I feel no adverse effects" He denied having nausea. He admitted feeling mild cold sensitivity and a lot of fatigue. Patient was reminded of his pump stop appointment on 12/21/2020 and he verbalized understanding. He knows to call this clinic with any complain or concerns.

## 2020-12-21 ENCOUNTER — Other Ambulatory Visit: Payer: Self-pay

## 2020-12-21 ENCOUNTER — Inpatient Hospital Stay: Payer: Medicare Other

## 2020-12-21 DIAGNOSIS — Z5111 Encounter for antineoplastic chemotherapy: Secondary | ICD-10-CM | POA: Diagnosis not present

## 2020-12-21 DIAGNOSIS — N189 Chronic kidney disease, unspecified: Secondary | ICD-10-CM | POA: Diagnosis not present

## 2020-12-21 DIAGNOSIS — C189 Malignant neoplasm of colon, unspecified: Secondary | ICD-10-CM

## 2020-12-21 DIAGNOSIS — C187 Malignant neoplasm of sigmoid colon: Secondary | ICD-10-CM | POA: Diagnosis not present

## 2020-12-21 DIAGNOSIS — G620 Drug-induced polyneuropathy: Secondary | ICD-10-CM | POA: Diagnosis not present

## 2020-12-21 DIAGNOSIS — C774 Secondary and unspecified malignant neoplasm of inguinal and lower limb lymph nodes: Secondary | ICD-10-CM | POA: Diagnosis not present

## 2020-12-21 DIAGNOSIS — D6959 Other secondary thrombocytopenia: Secondary | ICD-10-CM | POA: Diagnosis not present

## 2020-12-21 MED ORDER — SODIUM CHLORIDE 0.9% FLUSH
10.0000 mL | INTRAVENOUS | Status: DC | PRN
  Administered 2020-12-21: 10 mL

## 2020-12-21 MED ORDER — HEPARIN SOD (PORK) LOCK FLUSH 100 UNIT/ML IV SOLN
500.0000 [IU] | Freq: Once | INTRAVENOUS | Status: AC | PRN
  Administered 2020-12-21: 500 [IU]

## 2020-12-25 DIAGNOSIS — C187 Malignant neoplasm of sigmoid colon: Secondary | ICD-10-CM | POA: Diagnosis not present

## 2020-12-25 DIAGNOSIS — I129 Hypertensive chronic kidney disease with stage 1 through stage 4 chronic kidney disease, or unspecified chronic kidney disease: Secondary | ICD-10-CM | POA: Diagnosis not present

## 2020-12-25 DIAGNOSIS — N2581 Secondary hyperparathyroidism of renal origin: Secondary | ICD-10-CM | POA: Diagnosis not present

## 2020-12-25 DIAGNOSIS — E785 Hyperlipidemia, unspecified: Secondary | ICD-10-CM | POA: Diagnosis not present

## 2020-12-25 DIAGNOSIS — N179 Acute kidney failure, unspecified: Secondary | ICD-10-CM | POA: Diagnosis not present

## 2020-12-25 DIAGNOSIS — N1832 Chronic kidney disease, stage 3b: Secondary | ICD-10-CM | POA: Diagnosis not present

## 2020-12-25 DIAGNOSIS — E669 Obesity, unspecified: Secondary | ICD-10-CM | POA: Diagnosis not present

## 2020-12-29 ENCOUNTER — Other Ambulatory Visit: Payer: Self-pay | Admitting: Oncology

## 2021-01-02 ENCOUNTER — Inpatient Hospital Stay: Payer: Medicare Other

## 2021-01-02 ENCOUNTER — Inpatient Hospital Stay (HOSPITAL_BASED_OUTPATIENT_CLINIC_OR_DEPARTMENT_OTHER): Payer: Medicare Other | Admitting: Oncology

## 2021-01-02 ENCOUNTER — Other Ambulatory Visit: Payer: Self-pay

## 2021-01-02 VITALS — BP 139/62 | HR 79 | Temp 98.2°F | Resp 20 | Ht 76.0 in | Wt 250.2 lb

## 2021-01-02 DIAGNOSIS — C189 Malignant neoplasm of colon, unspecified: Secondary | ICD-10-CM

## 2021-01-02 DIAGNOSIS — D6959 Other secondary thrombocytopenia: Secondary | ICD-10-CM | POA: Diagnosis not present

## 2021-01-02 DIAGNOSIS — N189 Chronic kidney disease, unspecified: Secondary | ICD-10-CM | POA: Diagnosis not present

## 2021-01-02 DIAGNOSIS — C774 Secondary and unspecified malignant neoplasm of inguinal and lower limb lymph nodes: Secondary | ICD-10-CM | POA: Diagnosis not present

## 2021-01-02 DIAGNOSIS — G620 Drug-induced polyneuropathy: Secondary | ICD-10-CM | POA: Diagnosis not present

## 2021-01-02 DIAGNOSIS — C187 Malignant neoplasm of sigmoid colon: Secondary | ICD-10-CM | POA: Diagnosis not present

## 2021-01-02 DIAGNOSIS — Z5111 Encounter for antineoplastic chemotherapy: Secondary | ICD-10-CM | POA: Diagnosis not present

## 2021-01-02 LAB — CBC WITH DIFFERENTIAL (CANCER CENTER ONLY)
Abs Immature Granulocytes: 0.01 10*3/uL (ref 0.00–0.07)
Basophils Absolute: 0 10*3/uL (ref 0.0–0.1)
Basophils Relative: 1 %
Eosinophils Absolute: 0.2 10*3/uL (ref 0.0–0.5)
Eosinophils Relative: 5 %
HCT: 29.9 % — ABNORMAL LOW (ref 39.0–52.0)
Hemoglobin: 9.4 g/dL — ABNORMAL LOW (ref 13.0–17.0)
Immature Granulocytes: 0 %
Lymphocytes Relative: 26 %
Lymphs Abs: 0.9 10*3/uL (ref 0.7–4.0)
MCH: 27 pg (ref 26.0–34.0)
MCHC: 31.4 g/dL (ref 30.0–36.0)
MCV: 85.9 fL (ref 80.0–100.0)
Monocytes Absolute: 0.5 10*3/uL (ref 0.1–1.0)
Monocytes Relative: 14 %
Neutro Abs: 1.9 10*3/uL (ref 1.7–7.7)
Neutrophils Relative %: 54 %
Platelet Count: 129 10*3/uL — ABNORMAL LOW (ref 150–400)
RBC: 3.48 MIL/uL — ABNORMAL LOW (ref 4.22–5.81)
RDW: 14.6 % (ref 11.5–15.5)
WBC Count: 3.5 10*3/uL — ABNORMAL LOW (ref 4.0–10.5)
nRBC: 0 % (ref 0.0–0.2)

## 2021-01-02 LAB — CMP (CANCER CENTER ONLY)
ALT: 12 U/L (ref 0–44)
AST: 11 U/L — ABNORMAL LOW (ref 15–41)
Albumin: 3.7 g/dL (ref 3.5–5.0)
Alkaline Phosphatase: 76 U/L (ref 38–126)
Anion gap: 8 (ref 5–15)
BUN: 38 mg/dL — ABNORMAL HIGH (ref 8–23)
CO2: 23 mmol/L (ref 22–32)
Calcium: 8.6 mg/dL — ABNORMAL LOW (ref 8.9–10.3)
Chloride: 109 mmol/L (ref 98–111)
Creatinine: 2.57 mg/dL — ABNORMAL HIGH (ref 0.61–1.24)
GFR, Estimated: 25 mL/min — ABNORMAL LOW (ref >60)
Glucose, Bld: 137 mg/dL — ABNORMAL HIGH (ref 70–99)
Potassium: 4.1 mmol/L (ref 3.5–5.1)
Sodium: 140 mmol/L (ref 135–145)
Total Bilirubin: 0.3 mg/dL (ref 0.3–1.2)
Total Protein: 6.6 g/dL (ref 6.5–8.1)

## 2021-01-02 LAB — CEA (ACCESS): CEA (CHCC): 3.76 ng/mL (ref 0.00–5.00)

## 2021-01-02 MED ORDER — PALONOSETRON HCL INJECTION 0.25 MG/5ML
0.2500 mg | Freq: Once | INTRAVENOUS | Status: AC
  Administered 2021-01-02: 0.25 mg via INTRAVENOUS
  Filled 2021-01-02: qty 5

## 2021-01-02 MED ORDER — DEXTROSE 5 % IV SOLN: Freq: Once | INTRAVENOUS | Status: AC

## 2021-01-02 MED ORDER — LEUCOVORIN CALCIUM INJECTION 350 MG
300.0000 mg/m2 | Freq: Once | INTRAVENOUS | Status: AC
  Administered 2021-01-02: 742 mg via INTRAVENOUS
  Filled 2021-01-02: qty 37.1

## 2021-01-02 MED ORDER — FLUOROURACIL CHEMO INJECTION 2.5 GM/50ML
300.0000 mg/m2 | Freq: Once | INTRAVENOUS | Status: AC
  Administered 2021-01-02: 750 mg via INTRAVENOUS
  Filled 2021-01-02: qty 15

## 2021-01-02 MED ORDER — SODIUM CHLORIDE 0.9 % IV SOLN
10.0000 mg | Freq: Once | INTRAVENOUS | Status: AC
  Administered 2021-01-02: 10 mg via INTRAVENOUS
  Filled 2021-01-02: qty 1

## 2021-01-02 MED ORDER — SODIUM CHLORIDE 0.9 % IV SOLN
1800.0000 mg/m2 | INTRAVENOUS | Status: DC
  Administered 2021-01-02: 4450 mg via INTRAVENOUS
  Filled 2021-01-02: qty 89

## 2021-01-02 MED ORDER — OXALIPLATIN CHEMO INJECTION 100 MG/20ML
52.0000 mg/m2 | Freq: Once | INTRAVENOUS | Status: AC
  Administered 2021-01-02: 130 mg via INTRAVENOUS
  Filled 2021-01-02: qty 20

## 2021-01-02 NOTE — Progress Notes (Signed)
[facility] OFFICE PROGRESS NOTE   Diagnosis: Colon cancer  INTERVAL HISTORY:   [name] completed a cycle of FOLFOX on 12/19/2020.  No mouth sores.  He had 1 episode of diarrhea.  He had nausea on day 4.  The nausea was relieved with Compazine.  No emesis.  No neuropathy symptoms.  He feels the left inguinal mass is more firm and the left axillary mass is unchanged.  No new complaint.  Objective:  Vital signs in last 24 hours:  Blood pressure 139/62, pulse 79, temperature 98.2 F (36.8 C), temperature source Oral, resp. rate 20, height '6\' 4"'  (1.93 m), weight 250 lb 3.2 oz (113.5 kg), SpO2 98 %.    HEENT: No thrush or ulcers Lymphatics: 2-3 cm mobile node in the left axilla, 4-5 cm firm node in the left upper inguinal region Resp: Lungs clear bilaterally Cardio: Regular rate and rhythm GI: No hepatosplenomegaly Vascular: No leg edema   Portacath/PICC-without erythema  Lab Results:  Lab Results  Component Value Date   WBC 3.5 (L) 01/02/2021   HGB 9.4 (L) 01/02/2021   HCT 29.9 (L) 01/02/2021   MCV 85.9 01/02/2021   PLT 129 (L) 01/02/2021   NEUTROABS 1.9 01/02/2021    CMP  Lab Results  Component Value Date   NA 140 01/02/2021   K 4.1 01/02/2021   CL 109 01/02/2021   CO2 23 01/02/2021   GLUCOSE 137 (H) 01/02/2021   BUN 38 (H) 01/02/2021   CREATININE 2.57 (H) 01/02/2021   CALCIUM 8.6 (L) 01/02/2021   PROT 6.6 01/02/2021   ALBUMIN 3.7 01/02/2021   AST 11 (L) 01/02/2021   ALT 12 01/02/2021   ALKPHOS 76 01/02/2021   BILITOT 0.3 01/02/2021   GFRNONAA 25 (L) 01/02/2021   GFRAA 28 (L) 09/20/2019    Lab Results  Component Value Date   CEA1 2.34 12/05/2020   CEA 3.76 01/02/2021     Medications: I have reviewed the patient's current medications.   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), isolated mesenteric implant-resected, multiple tumor deposits Sigmoid/descending resection and creation of a descending colostomy  04/10/2016 MSI-stable, no loss of mismatch repair protein expression Foundation 1- BRAF V600E positive, MS-stable, intermediate tumor mutation burden, no RAS mutation CT abdomen/pelvis 04/02/2016-no evidence of distant metastatic disease Cycle 1 FOLFOX 05/21/2016 Cycle 2 FOLFOX 06/04/2016 Cycle 3 FOLFOX 06/18/2016 Cycle 4 FOLFOX 07/02/2016 (oxaliplatin held secondary to thrombocytopenia) Cycle 5 FOLFOX 07/16/2016 Cycle 6 FOLFOX 07/30/2016 Cycle 7 FOLFOX 08/13/2016 (oxaliplatin held and 5-FU dose reduced) Cycle 8 FOLFOX 09/03/2016 (oxaliplatin held secondary to neuropathy) Cycle 9 FOLFOX 09/17/2016 (oxaliplatin held secondary to neuropathy) Cycle 10 FOLFOX 10/02/2016 Cycle 11 FOLFOX 10/15/2016 (oxaliplatin eliminated from the regimen) Cycle 12 FOLFOX 10/30/2016 (oxaliplatin eliminated) CTs 05/12/2017-no evidence of recurrent disease, right inguinal hernia containing bladder Colonoscopy 07/21/2017, 3 polyps were removed from the descending and transverse colon, fragments of tubular and tubulovillous adenoma CTs 05/19/2018-no evidence of recurrent disease, status post hernia repair, right upper lobe pneumonia CTs 05/20/2019-resolution of right upper lobe pneumonia, no evidence of metastatic disease Colonoscopy 01/25/2020-multiple polyps removed-tubular adenomas, poor preparation, repeat colonoscopy recommended CTs neck, chest, and abdomen/pelvis 08/27/2020- new 2 centimeter left axillary node, new 1.9 cm left inguinal node chronic mildly prominent portacaval node Ultrasound biopsy of left inguinal node on 10/29/2020-metastatic adenocarcinoma consistent with colon adenocarcinoma PET [id number] hypermetabolic left inguinal and left axillary nodes, solitary focus of hypermetabolic activity in the right lower quadrant small bowel Cycle 1 FOLFOX 12/19/2020 Cycle 2 FOLFOX 01/02/2021,  oxaliplatin dose reduced secondary to neutropenia and thrombocytopenia   2.   Chronic renal insufficiency   3.    Hypertension   4.  Inflamed sebaceous cyst at the upper back-status post incision and drainage   5.  Thrombocytopenia secondary chemotherapy-oxaliplatin dose reduced beginning with cycle 3 FOLFOX, oxaliplatin held with cycle 4 and cycle 7 FOLFOX   6.  Oxaliplatin neuropathy-improved   7.  History of Mucositis secondary chemotherapy   8.  Symptoms of pneumonia January 2020-CT 05/19/2018 consistent with right upper lobe pneumonia, Levaquin prescribed 05/20/2018   9.  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  He tolerated the FOLFOX well other than delayed nausea.  He did not wish to change the antiemetic regimen today.  He will use Compazine and Zofran as needed.  He will complete cycle 2 FOLFOX today.  The neutrophil count and platelets are mildly decreased today.  I dose reduce the oxaliplatin.  He will return for an office visit in the next cycle of chemotherapy in 2 weeks.  Physical exam today reveals stable versus mildly improved palpable lymphadenopathy.  Betsy Coder, MD  01/02/2021  11:56 AM

## 2021-01-02 NOTE — Patient Instructions (Addendum)
The chemotherapy medication bag should finish at 46 hours, 96 hours, or 7 days. For example, if your pump is scheduled for 46 hours and it was put on at 4:00 p.m., it should finish at 2:00 p.m. the day it is scheduled to come off regardless of your appointment time.     Estimated time to finish at 130 on Friday   If the display on your pump reads "Low Volume" and it is beeping, take the batteries out of the pump and come to the cancer center for it to be taken off.   If the pump alarms go off prior to the pump reading "Low Volume" then call [phone] and someone can assist you.  If the plunger comes out and the chemotherapy medication is leaking out, please use your home chemo spill kit to clean up the spill. Do NOT use paper towels or other household products.  If you have problems or questions regarding your pump, please call either [phone] (24 hours a day) or the cancer center Monday-Friday 8:00 a.m.- 4:30 p.m. at the clinic number and we will assist you. If you are unable to get assistance, then go to the nearest Emergency Department and ask the staff to contact the IV team for assistance.  [facility]  Discharge Instructions: Thank you for choosing [facility] to provide your oncology and hematology care.   If you have a lab appointment with the [facility], please go directly to the [facility] and check in at the registration area.   Wear comfortable clothing and clothing appropriate for easy access to any Portacath or PICC line.   We strive to give you quality time with your provider. You may need to reschedule your appointment if you arrive late (15 or more minutes).  Arriving late affects you and other patients whose appointments are after yours.  Also, if you miss three or more appointments without notifying the office, you may be dismissed from the clinic at the provider's discretion.      For prescription refill requests, have  your pharmacy contact our office and allow 72 hours for refills to be completed.    Today you received the following chemotherapy and/or immunotherapy agents oxaliplatin, leucovorin, fluorouracil    To help prevent nausea and vomiting after your treatment, we encourage you to take your nausea medication as directed.  BELOW ARE SYMPTOMS THAT SHOULD BE REPORTED IMMEDIATELY: *FEVER GREATER THAN 100.4 F (38 C) OR HIGHER *CHILLS OR SWEATING *NAUSEA AND VOMITING THAT IS NOT CONTROLLED WITH YOUR NAUSEA MEDICATION *UNUSUAL SHORTNESS OF BREATH *UNUSUAL BRUISING OR BLEEDING *URINARY PROBLEMS (pain or burning when urinating, or frequent urination) *BOWEL PROBLEMS (unusual diarrhea, constipation, pain near the anus) TENDERNESS IN MOUTH AND THROAT WITH OR WITHOUT PRESENCE OF ULCERS (sore throat, sores in mouth, or a toothache) UNUSUAL RASH, SWELLING OR PAIN  UNUSUAL VAGINAL DISCHARGE OR ITCHING   Items with * indicate a potential emergency and should be followed up as soon as possible or go to the Emergency Department if any problems should occur.  Please show the CHEMOTHERAPY ALERT CARD or IMMUNOTHERAPY ALERT CARD at check-in to the Emergency Department and triage nurse.  Should you have questions after your visit or need to cancel or reschedule your appointment, please contact [facility]  Dept: [phone]  and follow the prompts.  Office hours are 8:00 a.m. to 4:30 p.m. Monday - Friday. Please note that voicemails left after 4:00 p.m. may  not be returned until the following business day.  We are closed weekends and major holidays. You have access to a nurse at all times for urgent questions. Please call the main number to the clinic Dept: [phone] and follow the prompts.   For any non-urgent questions, you may also contact your provider using MyChart. We now offer e-Visits for anyone 45 and older to request care online for non-urgent symptoms. For details visit  [email or web address].   Also download the MyChart app! Go to the app store, search "MyChart", open the app, select [facility], and log in with your MyChart username and password.  Due to Covid, a mask is required upon entering the hospital/clinic. If you do not have a mask, one will be given to you upon arrival. For doctor visits, patients may have 1 support person aged 76 or older with them. For treatment visits, patients cannot have anyone with them due to current Covid guidelines and our immunocompromised population.   Oxaliplatin Injection What is this medication? OXALIPLATIN (ox AL i PLA tin) is a chemotherapy drug. It targets fast dividing cells, like cancer cells, and causes these cells to die. This medicine is used to treat cancers of the colon and rectum, and many other cancers. This medicine may be used for other purposes; ask your health care provider or pharmacist if you have questions. COMMON BRAND NAME(S): Eloxatin What should I tell my care team before I take this medication? They need to know if you have any of these conditions: heart disease history of irregular heartbeat liver disease low blood counts, like white cells, platelets, or red blood cells lung or breathing disease, like asthma take medicines that treat or prevent blood clots tingling of the fingers or toes, or other nerve disorder an unusual or allergic reaction to oxaliplatin, other chemotherapy, other medicines, foods, dyes, or preservatives pregnant or trying to get pregnant breast-feeding How should I use this medication? This drug is given as an infusion into a vein. It is administered in a hospital or clinic by a specially trained health care professional. Talk to your pediatrician regarding the use of this medicine in children. Special care may be needed. Overdosage: If you think you have taken too much of this medicine contact a poison control center or emergency room at once. NOTE: This medicine is  only for you. Do not share this medicine with others. What if I miss a dose? It is important not to miss a dose. Call your doctor or health care professional if you are unable to keep an appointment. What may interact with this medication? Do not take this medicine with any of the following medications: cisapride dronedarone pimozide thioridazine This medicine may also interact with the following medications: aspirin and aspirin-like medicines certain medicines that treat or prevent blood clots like warfarin, apixaban, dabigatran, and rivaroxaban cisplatin cyclosporine diuretics medicines for infection like acyclovir, adefovir, amphotericin B, bacitracin, cidofovir, foscarnet, ganciclovir, gentamicin, pentamidine, vancomycin NSAIDs, medicines for pain and inflammation, like ibuprofen or naproxen other medicines that prolong the QT interval (an abnormal heart rhythm) pamidronate zoledronic acid This list may not describe all possible interactions. Give your health care provider a list of all the medicines, herbs, non-prescription drugs, or dietary supplements you use. Also tell them if you smoke, drink alcohol, or use illegal drugs. Some items may interact with your medicine. What should I watch for while using this medication? Your condition will be monitored carefully while you are receiving this medicine. You may need blood  work done while you are taking this medicine. This medicine may make you feel generally unwell. This is not uncommon as chemotherapy can affect healthy cells as well as cancer cells. Report any side effects. Continue your course of treatment even though you feel ill unless your healthcare professional tells you to stop. This medicine can make you more sensitive to cold. Do not drink cold drinks or use ice. Cover exposed skin before coming in contact with cold temperatures or cold objects. When out in cold weather wear warm clothing and cover your mouth and nose to warm  the air that goes into your lungs. Tell your doctor if you get sensitive to the cold. Do not become pregnant while taking this medicine or for 9 months after stopping it. Women should inform their health care professional if they wish to become pregnant or think they might be pregnant. Men should not father a child while taking this medicine and for 6 months after stopping it. There is potential for serious side effects to an unborn child. Talk to your health care professional for more information. Do not breast-feed a child while taking this medicine or for 3 months after stopping it. This medicine has caused ovarian failure in some women. This medicine may make it more difficult to get pregnant. Talk to your health care professional if you are concerned about your fertility. This medicine has caused decreased sperm counts in some men. This may make it more difficult to father a child. Talk to your health care professional if you are concerned about your fertility. This medicine may increase your risk of getting an infection. Call your health care professional for advice if you get a fever, chills, or sore throat, or other symptoms of a cold or flu. Do not treat yourself. Try to avoid being around people who are sick. Avoid taking medicines that contain aspirin, acetaminophen, ibuprofen, naproxen, or ketoprofen unless instructed by your health care professional. These medicines may hide a fever. Be careful brushing or flossing your teeth or using a toothpick because you may get an infection or bleed more easily. If you have any dental work done, tell your dentist you are receiving this medicine. What side effects may I notice from receiving this medication? Side effects that you should report to your doctor or health care professional as soon as possible: allergic reactions like skin rash, itching or hives, swelling of the face, lips, or tongue breathing problems cough low blood counts - this medicine  may decrease the number of white blood cells, red blood cells, and platelets. You may be at increased risk for infections and bleeding nausea, vomiting pain, redness, or irritation at site where injected pain, tingling, numbness in the hands or feet signs and symptoms of bleeding such as bloody or black, tarry stools; red or dark brown urine; spitting up blood or brown material that looks like coffee grounds; red spots on the skin; unusual bruising or bleeding from the eyes, gums, or nose signs and symptoms of a dangerous change in heartbeat or heart rhythm like chest pain; dizziness; fast, irregular heartbeat; palpitations; feeling faint or lightheaded; falls signs and symptoms of infection like fever; chills; cough; sore throat; pain or trouble passing urine signs and symptoms of liver injury like dark yellow or brown urine; general ill feeling or flu-like symptoms; light-colored stools; loss of appetite; nausea; right upper belly pain; unusually weak or tired; yellowing of the eyes or skin signs and symptoms of low red blood cells or  anemia such as unusually weak or tired; feeling faint or lightheaded; falls signs and symptoms of muscle injury like dark urine; trouble passing urine or change in the amount of urine; unusually weak or tired; muscle pain; back pain Side effects that usually do not require medical attention (report to your doctor or health care professional if they continue or are bothersome): changes in taste diarrhea gas hair loss loss of appetite mouth sores This list may not describe all possible side effects. Call your doctor for medical advice about side effects. You may report side effects to FDA at 1-800-FDA-1088. Where should I keep my medication? This drug is given in a hospital or clinic and will not be stored at home. NOTE: This sheet is a summary. It may not cover all possible information. If you have questions about this medicine, talk to your doctor, pharmacist, or  health care provider.  2022 Elsevier/Gold Standard (2018-09-08 12:20:35)  Leucovorin injection What is this medication? LEUCOVORIN (loo koe VOR in) is used to prevent or treat the harmful effects of some medicines. This medicine is used to treat anemia caused by a low amount of folic acid in the body. It is also used with 5-fluorouracil (5-FU) to treat colon cancer. This medicine may be used for other purposes; ask your health care provider or pharmacist if you have questions. What should I tell my care team before I take this medication? They need to know if you have any of these conditions: anemia from low levels of vitamin B-12 in the blood an unusual or allergic reaction to leucovorin, folic acid, other medicines, foods, dyes, or preservatives pregnant or trying to get pregnant breast-feeding How should I use this medication? This medicine is for injection into a muscle or into a vein. It is given by a health care professional in a hospital or clinic setting. Talk to your pediatrician regarding the use of this medicine in children. Special care may be needed. Overdosage: If you think you have taken too much of this medicine contact a poison control center or emergency room at once. NOTE: This medicine is only for you. Do not share this medicine with others. What if I miss a dose? This does not apply. What may interact with this medication? capecitabine fluorouracil phenobarbital phenytoin primidone trimethoprim-sulfamethoxazole This list may not describe all possible interactions. Give your health care provider a list of all the medicines, herbs, non-prescription drugs, or dietary supplements you use. Also tell them if you smoke, drink alcohol, or use illegal drugs. Some items may interact with your medicine. What should I watch for while using this medication? Your condition will be monitored carefully while you are receiving this medicine. This medicine may increase the side  effects of 5-fluorouracil, 5-FU. Tell your doctor or health care professional if you have diarrhea or mouth sores that do not get better or that get worse. What side effects may I notice from receiving this medication? Side effects that you should report to your doctor or health care professional as soon as possible: allergic reactions like skin rash, itching or hives, swelling of the face, lips, or tongue breathing problems fever, infection mouth sores unusual bleeding or bruising unusually weak or tired Side effects that usually do not require medical attention (report to your doctor or health care professional if they continue or are bothersome): constipation or diarrhea loss of appetite nausea, vomiting This list may not describe all possible side effects. Call your doctor for medical advice about side effects. You  may report side effects to FDA at 1-800-FDA-1088. Where should I keep my medication? This drug is given in a hospital or clinic and will not be stored at home. NOTE: This sheet is a summary. It may not cover all possible information. If you have questions about this medicine, talk to your doctor, pharmacist, or health care provider.  2022 Elsevier/Gold Standard (2007-10-26 16:50:29)  Fluorouracil, 5-FU injection What is this medication? FLUOROURACIL, 5-FU (flure oh YOOR a sil) is a chemotherapy drug. It slows the growth of cancer cells. This medicine is used to treat many types of cancer like breast cancer, colon or rectal cancer, pancreatic cancer, and stomach cancer. This medicine may be used for other purposes; ask your health care provider or pharmacist if you have questions. COMMON BRAND NAME(S): Adrucil What should I tell my care team before I take this medication? They need to know if you have any of these conditions: blood disorders dihydropyrimidine dehydrogenase (DPD) deficiency infection (especially a virus infection such as chickenpox, cold sores, or  herpes) kidney disease liver disease malnourished, poor nutrition recent or ongoing radiation therapy an unusual or allergic reaction to fluorouracil, other chemotherapy, other medicines, foods, dyes, or preservatives pregnant or trying to get pregnant breast-feeding How should I use this medication? This drug is given as an infusion or injection into a vein. It is administered in a hospital or clinic by a specially trained health care professional. Talk to your pediatrician regarding the use of this medicine in children. Special care may be needed. Overdosage: If you think you have taken too much of this medicine contact a poison control center or emergency room at once. NOTE: This medicine is only for you. Do not share this medicine with others. What if I miss a dose? It is important not to miss your dose. Call your doctor or health care professional if you are unable to keep an appointment. What may interact with this medication? Do not take this medicine with any of the following medications: live virus vaccines This medicine may also interact with the following medications: medicines that treat or prevent blood clots like warfarin, enoxaparin, and dalteparin This list may not describe all possible interactions. Give your health care provider a list of all the medicines, herbs, non-prescription drugs, or dietary supplements you use. Also tell them if you smoke, drink alcohol, or use illegal drugs. Some items may interact with your medicine. What should I watch for while using this medication? Visit your doctor for checks on your progress. This drug may make you feel generally unwell. This is not uncommon, as chemotherapy can affect healthy cells as well as cancer cells. Report any side effects. Continue your course of treatment even though you feel ill unless your doctor tells you to stop. In some cases, you may be given additional medicines to help with side effects. Follow all directions  for their use. Call your doctor or health care professional for advice if you get a fever, chills or sore throat, or other symptoms of a cold or flu. Do not treat yourself. This drug decreases your body's ability to fight infections. Try to avoid being around people who are sick. This medicine may increase your risk to bruise or bleed. Call your doctor or health care professional if you notice any unusual bleeding. Be careful brushing and flossing your teeth or using a toothpick because you may get an infection or bleed more easily. If you have any dental work done, tell your dentist you are  receiving this medicine. Avoid taking products that contain aspirin, acetaminophen, ibuprofen, naproxen, or ketoprofen unless instructed by your doctor. These medicines may hide a fever. Do not become pregnant while taking this medicine. Women should inform their doctor if they wish to become pregnant or think they might be pregnant. There is a potential for serious side effects to an unborn child. Talk to your health care professional or pharmacist for more information. Do not breast-feed an infant while taking this medicine. Men should inform their doctor if they wish to father a child. This medicine may lower sperm counts. Do not treat diarrhea with over the counter products. Contact your doctor if you have diarrhea that lasts more than 2 days or if it is severe and watery. This medicine can make you more sensitive to the sun. Keep out of the sun. If you cannot avoid being in the sun, wear protective clothing and use sunscreen. Do not use sun lamps or tanning beds/booths. What side effects may I notice from receiving this medication? Side effects that you should report to your doctor or health care professional as soon as possible: allergic reactions like skin rash, itching or hives, swelling of the face, lips, or tongue low blood counts - this medicine may decrease the number of white blood cells, red blood cells  and platelets. You may be at increased risk for infections and bleeding. signs of infection - fever or chills, cough, sore throat, pain or difficulty passing urine signs of decreased platelets or bleeding - bruising, pinpoint red spots on the skin, black, tarry stools, blood in the urine signs of decreased red blood cells - unusually weak or tired, fainting spells, lightheadedness breathing problems changes in vision chest pain mouth sores nausea and vomiting pain, swelling, redness at site where injected pain, tingling, numbness in the hands or feet redness, swelling, or sores on hands or feet stomach pain unusual bleeding Side effects that usually do not require medical attention (report to your doctor or health care professional if they continue or are bothersome): changes in finger or toe nails diarrhea dry or itchy skin hair loss headache loss of appetite sensitivity of eyes to the light stomach upset unusually teary eyes This list may not describe all possible side effects. Call your doctor for medical advice about side effects. You may report side effects to FDA at 1-800-FDA-1088. Where should I keep my medication? This drug is given in a hospital or clinic and will not be stored at home. NOTE: This sheet is a summary. It may not cover all possible information. If you have questions about this medicine, talk to your doctor, pharmacist, or health care provider.  2022 Elsevier/Gold Standard (2019-03-22 15:00:03)

## 2021-01-02 NOTE — Progress Notes (Signed)
Per Dr. Benay Spice: OK to treat today w/creatinine 2.57. He has dose reduced his oxaliplatin as well.

## 2021-01-03 LAB — CEA (IN HOUSE-CHCC): CEA (CHCC-In House): 2.61 ng/mL (ref 0.00–5.00)

## 2021-01-04 ENCOUNTER — Other Ambulatory Visit: Payer: Self-pay

## 2021-01-04 ENCOUNTER — Inpatient Hospital Stay: Payer: Medicare Other | Attending: Oncology

## 2021-01-04 VITALS — BP 142/65 | HR 60 | Temp 97.9°F | Resp 18

## 2021-01-04 DIAGNOSIS — D6959 Other secondary thrombocytopenia: Secondary | ICD-10-CM | POA: Diagnosis not present

## 2021-01-04 DIAGNOSIS — G620 Drug-induced polyneuropathy: Secondary | ICD-10-CM | POA: Insufficient documentation

## 2021-01-04 DIAGNOSIS — Z79899 Other long term (current) drug therapy: Secondary | ICD-10-CM | POA: Insufficient documentation

## 2021-01-04 DIAGNOSIS — Z5111 Encounter for antineoplastic chemotherapy: Secondary | ICD-10-CM | POA: Insufficient documentation

## 2021-01-04 DIAGNOSIS — T451X5A Adverse effect of antineoplastic and immunosuppressive drugs, initial encounter: Secondary | ICD-10-CM | POA: Diagnosis not present

## 2021-01-04 DIAGNOSIS — I129 Hypertensive chronic kidney disease with stage 1 through stage 4 chronic kidney disease, or unspecified chronic kidney disease: Secondary | ICD-10-CM | POA: Insufficient documentation

## 2021-01-04 DIAGNOSIS — C189 Malignant neoplasm of colon, unspecified: Secondary | ICD-10-CM

## 2021-01-04 DIAGNOSIS — C187 Malignant neoplasm of sigmoid colon: Secondary | ICD-10-CM | POA: Insufficient documentation

## 2021-01-04 DIAGNOSIS — N189 Chronic kidney disease, unspecified: Secondary | ICD-10-CM | POA: Insufficient documentation

## 2021-01-04 MED ORDER — HEPARIN SOD (PORK) LOCK FLUSH 100 UNIT/ML IV SOLN
500.0000 [IU] | Freq: Once | INTRAVENOUS | Status: AC | PRN
  Administered 2021-01-04: 500 [IU]

## 2021-01-04 MED ORDER — SODIUM CHLORIDE 0.9% FLUSH
10.0000 mL | INTRAVENOUS | Status: DC | PRN
  Administered 2021-01-04: 10 mL

## 2021-01-09 ENCOUNTER — Telehealth: Payer: Self-pay

## 2021-01-09 NOTE — Telephone Encounter (Signed)
TC to Pt, Pt stated he was not feeling well over the weekend. Pt stated he felt weak after Chemo and wanted to know what to do if this should happen again. Informed Pt that fatigue is a side effect after having chemo and he should stay hydrated like Gatorade or water. Pt stated he did drink some Gatorade and that did make him feel better. Informed Pt he can always give the office a call if he is not feeling well. Pt verbalized understanding. No further problems or concerns noted.

## 2021-01-09 NOTE — Telephone Encounter (Signed)
-----   Message from [facility] sent at 01/09/2021  8:00 AM EDT ----- Patient called in and would like a call back. Said he felt bad over the weekend due to chemo and he wants to know what he should do next time in the event he feels bad over the weekend again from chemo and unable to reach our office .

## 2021-01-12 ENCOUNTER — Other Ambulatory Visit: Payer: Self-pay | Admitting: Oncology

## 2021-01-16 ENCOUNTER — Other Ambulatory Visit: Payer: Self-pay

## 2021-01-16 ENCOUNTER — Inpatient Hospital Stay: Payer: Medicare Other

## 2021-01-16 ENCOUNTER — Telehealth: Payer: Self-pay

## 2021-01-16 ENCOUNTER — Inpatient Hospital Stay (HOSPITAL_BASED_OUTPATIENT_CLINIC_OR_DEPARTMENT_OTHER): Payer: Medicare Other | Admitting: Oncology

## 2021-01-16 VITALS — BP 127/60 | HR 72 | Temp 98.0°F | Resp 18 | Ht 76.0 in | Wt 252.8 lb

## 2021-01-16 DIAGNOSIS — C189 Malignant neoplasm of colon, unspecified: Secondary | ICD-10-CM

## 2021-01-16 DIAGNOSIS — Z5111 Encounter for antineoplastic chemotherapy: Secondary | ICD-10-CM | POA: Diagnosis not present

## 2021-01-16 DIAGNOSIS — N189 Chronic kidney disease, unspecified: Secondary | ICD-10-CM | POA: Diagnosis not present

## 2021-01-16 DIAGNOSIS — C187 Malignant neoplasm of sigmoid colon: Secondary | ICD-10-CM | POA: Diagnosis not present

## 2021-01-16 DIAGNOSIS — I129 Hypertensive chronic kidney disease with stage 1 through stage 4 chronic kidney disease, or unspecified chronic kidney disease: Secondary | ICD-10-CM | POA: Diagnosis not present

## 2021-01-16 DIAGNOSIS — G620 Drug-induced polyneuropathy: Secondary | ICD-10-CM | POA: Diagnosis not present

## 2021-01-16 DIAGNOSIS — D6959 Other secondary thrombocytopenia: Secondary | ICD-10-CM | POA: Diagnosis not present

## 2021-01-16 LAB — CBC WITH DIFFERENTIAL (CANCER CENTER ONLY)
Abs Immature Granulocytes: 0.03 10*3/uL (ref 0.00–0.07)
Basophils Absolute: 0 10*3/uL (ref 0.0–0.1)
Basophils Relative: 1 %
Eosinophils Absolute: 0.2 10*3/uL (ref 0.0–0.5)
Eosinophils Relative: 4 %
HCT: 27.8 % — ABNORMAL LOW (ref 39.0–52.0)
Hemoglobin: 8.8 g/dL — ABNORMAL LOW (ref 13.0–17.0)
Immature Granulocytes: 1 %
Lymphocytes Relative: 26 %
Lymphs Abs: 1 10*3/uL (ref 0.7–4.0)
MCH: 26.4 pg (ref 26.0–34.0)
MCHC: 31.7 g/dL (ref 30.0–36.0)
MCV: 83.5 fL (ref 80.0–100.0)
Monocytes Absolute: 0.5 10*3/uL (ref 0.1–1.0)
Monocytes Relative: 13 %
Neutro Abs: 2.2 10*3/uL (ref 1.7–7.7)
Neutrophils Relative %: 55 %
Platelet Count: 125 10*3/uL — ABNORMAL LOW (ref 150–400)
RBC: 3.33 MIL/uL — ABNORMAL LOW (ref 4.22–5.81)
RDW: 15.1 % (ref 11.5–15.5)
WBC Count: 4 10*3/uL (ref 4.0–10.5)
nRBC: 0 % (ref 0.0–0.2)

## 2021-01-16 LAB — CMP (CANCER CENTER ONLY)
ALT: 13 U/L (ref 0–44)
AST: 11 U/L — ABNORMAL LOW (ref 15–41)
Albumin: 3.9 g/dL (ref 3.5–5.0)
Alkaline Phosphatase: 81 U/L (ref 38–126)
Anion gap: 11 (ref 5–15)
BUN: 30 mg/dL — ABNORMAL HIGH (ref 8–23)
CO2: 20 mmol/L — ABNORMAL LOW (ref 22–32)
Calcium: 8.7 mg/dL — ABNORMAL LOW (ref 8.9–10.3)
Chloride: 108 mmol/L (ref 98–111)
Creatinine: 2.43 mg/dL — ABNORMAL HIGH (ref 0.61–1.24)
GFR, Estimated: 27 mL/min — ABNORMAL LOW (ref >60)
Glucose, Bld: 165 mg/dL — ABNORMAL HIGH (ref 70–99)
Potassium: 3.8 mmol/L (ref 3.5–5.1)
Sodium: 139 mmol/L (ref 135–145)
Total Bilirubin: 0.3 mg/dL (ref 0.3–1.2)
Total Protein: 6.7 g/dL (ref 6.5–8.1)

## 2021-01-16 MED ORDER — DEXTROSE 5 % IV SOLN: Freq: Once | INTRAVENOUS | Status: AC

## 2021-01-16 MED ORDER — FLUOROURACIL CHEMO INJECTION 2.5 GM/50ML
300.0000 mg/m2 | Freq: Once | INTRAVENOUS | Status: AC
  Administered 2021-01-16: 750 mg via INTRAVENOUS
  Filled 2021-01-16: qty 15

## 2021-01-16 MED ORDER — LEUCOVORIN CALCIUM INJECTION 350 MG
300.0000 mg/m2 | Freq: Once | INTRAVENOUS | Status: AC
  Administered 2021-01-16: 742 mg via INTRAVENOUS
  Filled 2021-01-16: qty 37.1

## 2021-01-16 MED ORDER — SODIUM CHLORIDE 0.9 % IV SOLN
10.0000 mg | Freq: Once | INTRAVENOUS | Status: AC
  Administered 2021-01-16: 10 mg via INTRAVENOUS
  Filled 2021-01-16: qty 1

## 2021-01-16 MED ORDER — OXALIPLATIN CHEMO INJECTION 100 MG/20ML
52.0000 mg/m2 | Freq: Once | INTRAVENOUS | Status: AC
  Administered 2021-01-16: 130 mg via INTRAVENOUS
  Filled 2021-01-16: qty 20

## 2021-01-16 MED ORDER — PALONOSETRON HCL INJECTION 0.25 MG/5ML
0.2500 mg | Freq: Once | INTRAVENOUS | Status: AC
  Administered 2021-01-16: 0.25 mg via INTRAVENOUS
  Filled 2021-01-16: qty 5

## 2021-01-16 MED ORDER — SODIUM CHLORIDE 0.9 % IV SOLN
1800.0000 mg/m2 | INTRAVENOUS | Status: DC
  Administered 2021-01-16: 4450 mg via INTRAVENOUS
  Filled 2021-01-16: qty 89

## 2021-01-16 NOTE — Patient Instructions (Signed)
[facility]  Discharge Instructions: Thank you for choosing [facility] to provide your oncology and hematology care.   If you have a lab appointment with the [facility], please go directly to the [facility] and check in at the registration area.   Wear comfortable clothing and clothing appropriate for easy access to any Portacath or PICC line.   We strive to give you quality time with your provider. You may need to reschedule your appointment if you arrive late (15 or more minutes).  Arriving late affects you and other patients whose appointments are after yours.  Also, if you miss three or more appointments without notifying the office, you may be dismissed from the clinic at the provider's discretion.      For prescription refill requests, have your pharmacy contact our office and allow 72 hours for refills to be completed.    Today you received the following chemotherapy and/or immunotherapy agents Oxaliplatin, Leucovorin, 5 FU      To help prevent nausea and vomiting after your treatment, we encourage you to take your nausea medication as directed.  BELOW ARE SYMPTOMS THAT SHOULD BE REPORTED IMMEDIATELY: *FEVER GREATER THAN 100.4 F (38 C) OR HIGHER *CHILLS OR SWEATING *NAUSEA AND VOMITING THAT IS NOT CONTROLLED WITH YOUR NAUSEA MEDICATION *UNUSUAL SHORTNESS OF BREATH *UNUSUAL BRUISING OR BLEEDING *URINARY PROBLEMS (pain or burning when urinating, or frequent urination) *BOWEL PROBLEMS (unusual diarrhea, constipation, pain near the anus) TENDERNESS IN MOUTH AND THROAT WITH OR WITHOUT PRESENCE OF ULCERS (sore throat, sores in mouth, or a toothache) UNUSUAL RASH, SWELLING OR PAIN  UNUSUAL VAGINAL DISCHARGE OR ITCHING   Items with * indicate a potential emergency and should be followed up as soon as possible or go to the Emergency Department if any problems should occur.  Please show the CHEMOTHERAPY ALERT CARD or IMMUNOTHERAPY ALERT CARD  at check-in to the Emergency Department and triage nurse.  Should you have questions after your visit or need to cancel or reschedule your appointment, please contact [facility]  Dept: [phone]  and follow the prompts.  Office hours are 8:00 a.m. to 4:30 p.m. Monday - Friday. Please note that voicemails left after 4:00 p.m. may not be returned until the following business day.  We are closed weekends and major holidays. You have access to a nurse at all times for urgent questions. Please call the main number to the clinic Dept: [phone] and follow the prompts.   For any non-urgent questions, you may also contact your provider using MyChart. We now offer e-Visits for anyone 65 and older to request care online for non-urgent symptoms. For details visit [email or web address].   Also download the MyChart app! Go to the app store, search "MyChart", open the app, select [facility], and log in with your MyChart username and password.  Due to Covid, a mask is required upon entering the hospital/clinic. If you do not have a mask, one will be given to you upon arrival. For doctor visits, patients may have 1 support person aged 81 or older with them. For treatment visits, patients cannot have anyone with them due to current Covid guidelines and our immunocompromised population.   Oxaliplatin Injection What is this medication? OXALIPLATIN (ox AL i PLA tin) is a chemotherapy drug. It targets fast dividing cells, like cancer cells, and causes these cells to die. This medicine is used to treat cancers of the colon and rectum, and many other cancers.  This medicine may be used for other purposes; ask your health care provider or pharmacist if you have questions. COMMON BRAND NAME(S): Eloxatin What should I tell my care team before I take this medication? They need to know if you have any of these conditions: heart disease history of irregular heartbeat liver disease low  blood counts, like white cells, platelets, or red blood cells lung or breathing disease, like asthma take medicines that treat or prevent blood clots tingling of the fingers or toes, or other nerve disorder an unusual or allergic reaction to oxaliplatin, other chemotherapy, other medicines, foods, dyes, or preservatives pregnant or trying to get pregnant breast-feeding How should I use this medication? This drug is given as an infusion into a vein. It is administered in a hospital or clinic by a specially trained health care professional. Talk to your pediatrician regarding the use of this medicine in children. Special care may be needed. Overdosage: If you think you have taken too much of this medicine contact a poison control center or emergency room at once. NOTE: This medicine is only for you. Do not share this medicine with others. What if I miss a dose? It is important not to miss a dose. Call your doctor or health care professional if you are unable to keep an appointment. What may interact with this medication? Do not take this medicine with any of the following medications: cisapride dronedarone pimozide thioridazine This medicine may also interact with the following medications: aspirin and aspirin-like medicines certain medicines that treat or prevent blood clots like warfarin, apixaban, dabigatran, and rivaroxaban cisplatin cyclosporine diuretics medicines for infection like acyclovir, adefovir, amphotericin B, bacitracin, cidofovir, foscarnet, ganciclovir, gentamicin, pentamidine, vancomycin NSAIDs, medicines for pain and inflammation, like ibuprofen or naproxen other medicines that prolong the QT interval (an abnormal heart rhythm) pamidronate zoledronic acid This list may not describe all possible interactions. Give your health care provider a list of all the medicines, herbs, non-prescription drugs, or dietary supplements you use. Also tell them if you smoke, drink  alcohol, or use illegal drugs. Some items may interact with your medicine. What should I watch for while using this medication? Your condition will be monitored carefully while you are receiving this medicine. You may need blood work done while you are taking this medicine. This medicine may make you feel generally unwell. This is not uncommon as chemotherapy can affect healthy cells as well as cancer cells. Report any side effects. Continue your course of treatment even though you feel ill unless your healthcare professional tells you to stop. This medicine can make you more sensitive to cold. Do not drink cold drinks or use ice. Cover exposed skin before coming in contact with cold temperatures or cold objects. When out in cold weather wear warm clothing and cover your mouth and nose to warm the air that goes into your lungs. Tell your doctor if you get sensitive to the cold. Do not become pregnant while taking this medicine or for 9 months after stopping it. Women should inform their health care professional if they wish to become pregnant or think they might be pregnant. Men should not father a child while taking this medicine and for 6 months after stopping it. There is potential for serious side effects to an unborn child. Talk to your health care professional for more information. Do not breast-feed a child while taking this medicine or for 3 months after stopping it. This medicine has caused ovarian failure in some women. This  medicine may make it more difficult to get pregnant. Talk to your health care professional if you are concerned about your fertility. This medicine has caused decreased sperm counts in some men. This may make it more difficult to father a child. Talk to your health care professional if you are concerned about your fertility. This medicine may increase your risk of getting an infection. Call your health care professional for advice if you get a fever, chills, or sore throat,  or other symptoms of a cold or flu. Do not treat yourself. Try to avoid being around people who are sick. Avoid taking medicines that contain aspirin, acetaminophen, ibuprofen, naproxen, or ketoprofen unless instructed by your health care professional. These medicines may hide a fever. Be careful brushing or flossing your teeth or using a toothpick because you may get an infection or bleed more easily. If you have any dental work done, tell your dentist you are receiving this medicine. What side effects may I notice from receiving this medication? Side effects that you should report to your doctor or health care professional as soon as possible: allergic reactions like skin rash, itching or hives, swelling of the face, lips, or tongue breathing problems cough low blood counts - this medicine may decrease the number of white blood cells, red blood cells, and platelets. You may be at increased risk for infections and bleeding nausea, vomiting pain, redness, or irritation at site where injected pain, tingling, numbness in the hands or feet signs and symptoms of bleeding such as bloody or black, tarry stools; red or dark brown urine; spitting up blood or brown material that looks like coffee grounds; red spots on the skin; unusual bruising or bleeding from the eyes, gums, or nose signs and symptoms of a dangerous change in heartbeat or heart rhythm like chest pain; dizziness; fast, irregular heartbeat; palpitations; feeling faint or lightheaded; falls signs and symptoms of infection like fever; chills; cough; sore throat; pain or trouble passing urine signs and symptoms of liver injury like dark yellow or brown urine; general ill feeling or flu-like symptoms; light-colored stools; loss of appetite; nausea; right upper belly pain; unusually weak or tired; yellowing of the eyes or skin signs and symptoms of low red blood cells or anemia such as unusually weak or tired; feeling faint or lightheaded;  falls signs and symptoms of muscle injury like dark urine; trouble passing urine or change in the amount of urine; unusually weak or tired; muscle pain; back pain Side effects that usually do not require medical attention (report to your doctor or health care professional if they continue or are bothersome): changes in taste diarrhea gas hair loss loss of appetite mouth sores This list may not describe all possible side effects. Call your doctor for medical advice about side effects. You may report side effects to FDA at 1-800-FDA-1088. Where should I keep my medication? This drug is given in a hospital or clinic and will not be stored at home. NOTE: This sheet is a summary. It may not cover all possible information. If you have questions about this medicine, talk to your doctor, pharmacist, or health care provider.  2022 Elsevier/Gold Standard (2018-09-08 12:20:35)  Leucovorin injection What is this medication? LEUCOVORIN (loo koe VOR in) is used to prevent or treat the harmful effects of some medicines. This medicine is used to treat anemia caused by a low amount of folic acid in the body. It is also used with 5-fluorouracil (5-FU) to treat colon cancer. This medicine  may be used for other purposes; ask your health care provider or pharmacist if you have questions. What should I tell my care team before I take this medication? They need to know if you have any of these conditions: anemia from low levels of vitamin B-12 in the blood an unusual or allergic reaction to leucovorin, folic acid, other medicines, foods, dyes, or preservatives pregnant or trying to get pregnant breast-feeding How should I use this medication? This medicine is for injection into a muscle or into a vein. It is given by a health care professional in a hospital or clinic setting. Talk to your pediatrician regarding the use of this medicine in children. Special care may be needed. Overdosage: If you think you have  taken too much of this medicine contact a poison control center or emergency room at once. NOTE: This medicine is only for you. Do not share this medicine with others. What if I miss a dose? This does not apply. What may interact with this medication? capecitabine fluorouracil phenobarbital phenytoin primidone trimethoprim-sulfamethoxazole This list may not describe all possible interactions. Give your health care provider a list of all the medicines, herbs, non-prescription drugs, or dietary supplements you use. Also tell them if you smoke, drink alcohol, or use illegal drugs. Some items may interact with your medicine. What should I watch for while using this medication? Your condition will be monitored carefully while you are receiving this medicine. This medicine may increase the side effects of 5-fluorouracil, 5-FU. Tell your doctor or health care professional if you have diarrhea or mouth sores that do not get better or that get worse. What side effects may I notice from receiving this medication? Side effects that you should report to your doctor or health care professional as soon as possible: allergic reactions like skin rash, itching or hives, swelling of the face, lips, or tongue breathing problems fever, infection mouth sores unusual bleeding or bruising unusually weak or tired Side effects that usually do not require medical attention (report to your doctor or health care professional if they continue or are bothersome): constipation or diarrhea loss of appetite nausea, vomiting This list may not describe all possible side effects. Call your doctor for medical advice about side effects. You may report side effects to FDA at 1-800-FDA-1088. Where should I keep my medication? This drug is given in a hospital or clinic and will not be stored at home. NOTE: This sheet is a summary. It may not cover all possible information. If you have questions about this medicine, talk to  your doctor, pharmacist, or health care provider.  2022 Elsevier/Gold Standard (2007-10-26 16:50:29)  Fluorouracil, 5-FU injection What is this medication? FLUOROURACIL, 5-FU (flure oh YOOR a sil) is a chemotherapy drug. It slows the growth of cancer cells. This medicine is used to treat many types of cancer like breast cancer, colon or rectal cancer, pancreatic cancer, and stomach cancer. This medicine may be used for other purposes; ask your health care provider or pharmacist if you have questions. COMMON BRAND NAME(S): Adrucil What should I tell my care team before I take this medication? They need to know if you have any of these conditions: blood disorders dihydropyrimidine dehydrogenase (DPD) deficiency infection (especially a virus infection such as chickenpox, cold sores, or herpes) kidney disease liver disease malnourished, poor nutrition recent or ongoing radiation therapy an unusual or allergic reaction to fluorouracil, other chemotherapy, other medicines, foods, dyes, or preservatives pregnant or trying to get pregnant breast-feeding How should  I use this medication? This drug is given as an infusion or injection into a vein. It is administered in a hospital or clinic by a specially trained health care professional. Talk to your pediatrician regarding the use of this medicine in children. Special care may be needed. Overdosage: If you think you have taken too much of this medicine contact a poison control center or emergency room at once. NOTE: This medicine is only for you. Do not share this medicine with others. What if I miss a dose? It is important not to miss your dose. Call your doctor or health care professional if you are unable to keep an appointment. What may interact with this medication? Do not take this medicine with any of the following medications: live virus vaccines This medicine may also interact with the following medications: medicines that treat or  prevent blood clots like warfarin, enoxaparin, and dalteparin This list may not describe all possible interactions. Give your health care provider a list of all the medicines, herbs, non-prescription drugs, or dietary supplements you use. Also tell them if you smoke, drink alcohol, or use illegal drugs. Some items may interact with your medicine. What should I watch for while using this medication? Visit your doctor for checks on your progress. This drug may make you feel generally unwell. This is not uncommon, as chemotherapy can affect healthy cells as well as cancer cells. Report any side effects. Continue your course of treatment even though you feel ill unless your doctor tells you to stop. In some cases, you may be given additional medicines to help with side effects. Follow all directions for their use. Call your doctor or health care professional for advice if you get a fever, chills or sore throat, or other symptoms of a cold or flu. Do not treat yourself. This drug decreases your body's ability to fight infections. Try to avoid being around people who are sick. This medicine may increase your risk to bruise or bleed. Call your doctor or health care professional if you notice any unusual bleeding. Be careful brushing and flossing your teeth or using a toothpick because you may get an infection or bleed more easily. If you have any dental work done, tell your dentist you are receiving this medicine. Avoid taking products that contain aspirin, acetaminophen, ibuprofen, naproxen, or ketoprofen unless instructed by your doctor. These medicines may hide a fever. Do not become pregnant while taking this medicine. Women should inform their doctor if they wish to become pregnant or think they might be pregnant. There is a potential for serious side effects to an unborn child. Talk to your health care professional or pharmacist for more information. Do not breast-feed an infant while taking this  medicine. Men should inform their doctor if they wish to father a child. This medicine may lower sperm counts. Do not treat diarrhea with over the counter products. Contact your doctor if you have diarrhea that lasts more than 2 days or if it is severe and watery. This medicine can make you more sensitive to the sun. Keep out of the sun. If you cannot avoid being in the sun, wear protective clothing and use sunscreen. Do not use sun lamps or tanning beds/booths. What side effects may I notice from receiving this medication? Side effects that you should report to your doctor or health care professional as soon as possible: allergic reactions like skin rash, itching or hives, swelling of the face, lips, or tongue low blood counts - this  medicine may decrease the number of white blood cells, red blood cells and platelets. You may be at increased risk for infections and bleeding. signs of infection - fever or chills, cough, sore throat, pain or difficulty passing urine signs of decreased platelets or bleeding - bruising, pinpoint red spots on the skin, black, tarry stools, blood in the urine signs of decreased red blood cells - unusually weak or tired, fainting spells, lightheadedness breathing problems changes in vision chest pain mouth sores nausea and vomiting pain, swelling, redness at site where injected pain, tingling, numbness in the hands or feet redness, swelling, or sores on hands or feet stomach pain unusual bleeding Side effects that usually do not require medical attention (report to your doctor or health care professional if they continue or are bothersome): changes in finger or toe nails diarrhea dry or itchy skin hair loss headache loss of appetite sensitivity of eyes to the light stomach upset unusually teary eyes This list may not describe all possible side effects. Call your doctor for medical advice about side effects. You may report side effects to FDA at  1-800-FDA-1088. Where should I keep my medication? This drug is given in a hospital or clinic and will not be stored at home. NOTE: This sheet is a summary. It may not cover all possible information. If you have questions about this medicine, talk to your doctor, pharmacist, or health care provider.  2022 Elsevier/Gold Standard (2019-03-22 15:00:03)

## 2021-01-16 NOTE — Telephone Encounter (Signed)
Per Dr Benay Spice, [place] to treat with creatinine 2.43

## 2021-01-16 NOTE — Progress Notes (Signed)
..................  Patient presents for treatment. RN assessment completed along with the following:  Treatment Conditions (labs/vitals/weight) reviewed - Yes, and ok to treat with creat 2.47    Oncology Treatment Attestation completed for current therapy- Yes, on date 12/05/20 Informed consent completed and reflects current therapy/intent - Yes, on date 12/05/20             Provider progress note reviewed - Patient not seen by provider today. Most recent note dated 01/02/21 reviewed. Treatment/Antibody/Supportive plan reviewed - "Yes, and there are no adjustments needed for today's treatment."} S&H and other orders reviewed - Yes, and there are no additional orders identified. Previous treatment date reviewed - Yes, and the appropriate amount of time has elapsed between treatments.   Patient to proceed with treatment.

## 2021-01-16 NOTE — Progress Notes (Signed)
[place] OFFICE PROGRESS NOTE   Diagnosis: Colon cancer  INTERVAL HISTORY:   [name] completed another cycle of FOLFOX on 01/02/2021.  No nausea following the cycle of chemotherapy.  He reports increased malaise for several days following chemotherapy.  He felt he was going to develop an ulcer at the lip, but this did not occur.  He has increased numbness in the feet.  No numbness in the hands.  Mild cold sensitivity.  The left axillary lymph node is now "sore ".  Objective:  Vital signs in last 24 hours:  Blood pressure 127/60, pulse 72, temperature 98 F (36.7 C), temperature source Oral, resp. rate 18, height '6\' 4"'  (1.93 m), weight 252 lb 12.8 oz (114.7 kg), SpO2 100 %.    HEENT: No thrush or ulcers Lymphatics: 2-3 cm mobile left axillary node, 5 cm firm left inguinal node Resp: Lungs clear bilaterally Cardio: Regular rate and rhythm GI: No hepatosplenomegaly Vascular: No leg edema   Portacath/PICC-without erythema  Lab Results:  Lab Results  Component Value Date   WBC 4.0 01/16/2021   HGB 8.8 (L) 01/16/2021   HCT 27.8 (L) 01/16/2021   MCV 83.5 01/16/2021   PLT 125 (L) 01/16/2021   NEUTROABS 2.2 01/16/2021    CMP  Lab Results  Component Value Date   NA 139 01/16/2021   K 3.8 01/16/2021   CL 108 01/16/2021   CO2 20 (L) 01/16/2021   GLUCOSE 165 (H) 01/16/2021   BUN 30 (H) 01/16/2021   CREATININE 2.43 (H) 01/16/2021   CALCIUM 8.7 (L) 01/16/2021   PROT 6.7 01/16/2021   ALBUMIN 3.9 01/16/2021   AST 11 (L) 01/16/2021   ALT 13 01/16/2021   ALKPHOS 81 01/16/2021   BILITOT 0.3 01/16/2021   GFRNONAA 27 (L) 01/16/2021   GFRAA 28 (L) 09/20/2019    Lab Results  Component Value Date   CEA1 2.61 01/02/2021   CEA 3.76 01/02/2021    Lab Results  Component Value Date   INR 0.9 10/11/2020   LABPROT 12.6 10/11/2020    Imaging:  No results found.  Medications: I have reviewed the patient's current  medications.   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), isolated mesenteric implant-resected, multiple tumor deposits Sigmoid/descending resection and creation of a descending colostomy 04/10/2016 MSI-stable, no loss of mismatch repair protein expression Foundation 1- BRAF V600E positive, MS-stable, intermediate tumor mutation burden, no RAS mutation CT abdomen/pelvis 04/02/2016-no evidence of distant metastatic disease Cycle 1 FOLFOX 05/21/2016 Cycle 2 FOLFOX 06/04/2016 Cycle 3 FOLFOX 06/18/2016 Cycle 4 FOLFOX 07/02/2016 (oxaliplatin held secondary to thrombocytopenia) Cycle 5 FOLFOX 07/16/2016 Cycle 6 FOLFOX 07/30/2016 Cycle 7 FOLFOX 08/13/2016 (oxaliplatin held and 5-FU dose reduced) Cycle 8 FOLFOX 09/03/2016 (oxaliplatin held secondary to neuropathy) Cycle 9 FOLFOX 09/17/2016 (oxaliplatin held secondary to neuropathy) Cycle 10 FOLFOX 10/02/2016 Cycle 11 FOLFOX 10/15/2016 (oxaliplatin eliminated from the regimen) Cycle 12 FOLFOX 10/30/2016 (oxaliplatin eliminated) CTs 05/12/2017-no evidence of recurrent disease, right inguinal hernia containing bladder Colonoscopy 07/21/2017, 3 polyps were removed from the descending and transverse colon, fragments of tubular and tubulovillous adenoma CTs 05/19/2018-no evidence of recurrent disease, status post hernia repair, right upper lobe pneumonia CTs 05/20/2019-resolution of right upper lobe pneumonia, no evidence of metastatic disease Colonoscopy 01/25/2020-multiple polyps removed-tubular adenomas, poor preparation, repeat colonoscopy recommended CTs neck, chest, and abdomen/pelvis 08/27/2020- new 2 centimeter left axillary node, new 1.9 cm left inguinal node chronic mildly prominent portacaval node Ultrasound biopsy of left inguinal node on 10/29/2020-metastatic adenocarcinoma consistent with colon adenocarcinoma  PET 3/[id number] hypermetabolic left inguinal and left axillary nodes, solitary focus of hypermetabolic activity  in the right lower quadrant small bowel Cycle 1 FOLFOX 12/19/2020 Cycle 2 FOLFOX 01/02/2021, oxaliplatin dose reduced secondary to neutropenia and thrombocytopenia Cycle 3 FOLFOX 01/16/2021   2.   Chronic renal insufficiency   3.   Hypertension   4.  Inflamed sebaceous cyst at the upper back-status post incision and drainage   5.  Thrombocytopenia secondary chemotherapy-oxaliplatin dose reduced beginning with cycle 3 FOLFOX, oxaliplatin held with cycle 4 and cycle 7 FOLFOX   6.  Oxaliplatin neuropathy-improved   7.  History of Mucositis secondary chemotherapy   8.  Symptoms of pneumonia January 2020-CT 05/19/2018 consistent with right upper lobe pneumonia, Levaquin prescribed 05/20/2018   9.  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 Luffman appears stable.  The left axillary node appears smaller on exam today.  He will complete cycle 3 FOLFOX today.  He will return for an office visit and cycle 4 FOLFOX in 2 weeks.  He will be referred for a restaging CT evaluation after cycle 4 or 5.  Betsy Coder, MD  01/16/2021  11:47 AM

## 2021-01-18 ENCOUNTER — Inpatient Hospital Stay: Payer: Medicare Other

## 2021-01-18 ENCOUNTER — Other Ambulatory Visit: Payer: Self-pay

## 2021-01-18 VITALS — BP 135/69 | HR 62 | Temp 97.9°F | Resp 18

## 2021-01-18 DIAGNOSIS — G620 Drug-induced polyneuropathy: Secondary | ICD-10-CM | POA: Diagnosis not present

## 2021-01-18 DIAGNOSIS — Z5111 Encounter for antineoplastic chemotherapy: Secondary | ICD-10-CM | POA: Diagnosis not present

## 2021-01-18 DIAGNOSIS — C189 Malignant neoplasm of colon, unspecified: Secondary | ICD-10-CM

## 2021-01-18 DIAGNOSIS — N189 Chronic kidney disease, unspecified: Secondary | ICD-10-CM | POA: Diagnosis not present

## 2021-01-18 DIAGNOSIS — D6959 Other secondary thrombocytopenia: Secondary | ICD-10-CM | POA: Diagnosis not present

## 2021-01-18 DIAGNOSIS — C187 Malignant neoplasm of sigmoid colon: Secondary | ICD-10-CM | POA: Diagnosis not present

## 2021-01-18 DIAGNOSIS — I129 Hypertensive chronic kidney disease with stage 1 through stage 4 chronic kidney disease, or unspecified chronic kidney disease: Secondary | ICD-10-CM | POA: Diagnosis not present

## 2021-01-18 MED ORDER — SODIUM CHLORIDE 0.9% FLUSH
10.0000 mL | INTRAVENOUS | Status: DC | PRN
  Administered 2021-01-18: 10 mL

## 2021-01-18 MED ORDER — HEPARIN SOD (PORK) LOCK FLUSH 100 UNIT/ML IV SOLN
500.0000 [IU] | Freq: Once | INTRAVENOUS | Status: AC | PRN
Start: 2021-01-18 — End: 2021-01-18
  Administered 2021-01-18: 500 [IU]

## 2021-01-21 ENCOUNTER — Encounter: Payer: Self-pay | Admitting: Oncology

## 2021-01-25 ENCOUNTER — Other Ambulatory Visit: Payer: Self-pay | Admitting: Oncology

## 2021-01-29 ENCOUNTER — Inpatient Hospital Stay (HOSPITAL_BASED_OUTPATIENT_CLINIC_OR_DEPARTMENT_OTHER): Payer: Medicare Other | Admitting: Nurse Practitioner

## 2021-01-29 ENCOUNTER — Inpatient Hospital Stay: Payer: Medicare Other

## 2021-01-29 ENCOUNTER — Other Ambulatory Visit: Payer: Self-pay

## 2021-01-29 ENCOUNTER — Encounter: Payer: Self-pay | Admitting: Nurse Practitioner

## 2021-01-29 VITALS — BP 147/73 | HR 83 | Temp 98.1°F | Resp 18 | Ht 76.0 in | Wt 250.6 lb

## 2021-01-29 DIAGNOSIS — C189 Malignant neoplasm of colon, unspecified: Secondary | ICD-10-CM

## 2021-01-29 DIAGNOSIS — Z5111 Encounter for antineoplastic chemotherapy: Secondary | ICD-10-CM | POA: Diagnosis not present

## 2021-01-29 DIAGNOSIS — I129 Hypertensive chronic kidney disease with stage 1 through stage 4 chronic kidney disease, or unspecified chronic kidney disease: Secondary | ICD-10-CM | POA: Diagnosis not present

## 2021-01-29 DIAGNOSIS — G620 Drug-induced polyneuropathy: Secondary | ICD-10-CM | POA: Diagnosis not present

## 2021-01-29 DIAGNOSIS — C187 Malignant neoplasm of sigmoid colon: Secondary | ICD-10-CM | POA: Diagnosis not present

## 2021-01-29 DIAGNOSIS — D6959 Other secondary thrombocytopenia: Secondary | ICD-10-CM | POA: Diagnosis not present

## 2021-01-29 DIAGNOSIS — Z95828 Presence of other vascular implants and grafts: Secondary | ICD-10-CM

## 2021-01-29 DIAGNOSIS — N189 Chronic kidney disease, unspecified: Secondary | ICD-10-CM | POA: Diagnosis not present

## 2021-01-29 LAB — CBC WITH DIFFERENTIAL (CANCER CENTER ONLY)
Abs Immature Granulocytes: 0.02 10*3/uL (ref 0.00–0.07)
Basophils Absolute: 0 10*3/uL (ref 0.0–0.1)
Basophils Relative: 1 %
Eosinophils Absolute: 0.2 10*3/uL (ref 0.0–0.5)
Eosinophils Relative: 5 %
HCT: 28.8 % — ABNORMAL LOW (ref 39.0–52.0)
Hemoglobin: 9 g/dL — ABNORMAL LOW (ref 13.0–17.0)
Immature Granulocytes: 1 %
Lymphocytes Relative: 29 %
Lymphs Abs: 1.1 10*3/uL (ref 0.7–4.0)
MCH: 26.1 pg (ref 26.0–34.0)
MCHC: 31.3 g/dL (ref 30.0–36.0)
MCV: 83.5 fL (ref 80.0–100.0)
Monocytes Absolute: 0.7 10*3/uL (ref 0.1–1.0)
Monocytes Relative: 18 %
Neutro Abs: 1.7 10*3/uL (ref 1.7–7.7)
Neutrophils Relative %: 46 %
Platelet Count: 112 10*3/uL — ABNORMAL LOW (ref 150–400)
RBC: 3.45 MIL/uL — ABNORMAL LOW (ref 4.22–5.81)
RDW: 16.4 % — ABNORMAL HIGH (ref 11.5–15.5)
WBC Count: 3.7 10*3/uL — ABNORMAL LOW (ref 4.0–10.5)
nRBC: 0 % (ref 0.0–0.2)

## 2021-01-29 LAB — CMP (CANCER CENTER ONLY)
ALT: 14 U/L (ref 0–44)
AST: 11 U/L — ABNORMAL LOW (ref 15–41)
Albumin: 4 g/dL (ref 3.5–5.0)
Alkaline Phosphatase: 74 U/L (ref 38–126)
Anion gap: 10 (ref 5–15)
BUN: 37 mg/dL — ABNORMAL HIGH (ref 8–23)
CO2: 20 mmol/L — ABNORMAL LOW (ref 22–32)
Calcium: 9 mg/dL (ref 8.9–10.3)
Chloride: 109 mmol/L (ref 98–111)
Creatinine: 2.38 mg/dL — ABNORMAL HIGH (ref 0.61–1.24)
GFR, Estimated: 28 mL/min — ABNORMAL LOW (ref >60)
Glucose, Bld: 135 mg/dL — ABNORMAL HIGH (ref 70–99)
Potassium: 3.9 mmol/L (ref 3.5–5.1)
Sodium: 139 mmol/L (ref 135–145)
Total Bilirubin: 0.4 mg/dL (ref 0.3–1.2)
Total Protein: 6.7 g/dL (ref 6.5–8.1)

## 2021-01-29 LAB — SAMPLE TO BLOOD BANK

## 2021-01-29 MED ORDER — SODIUM CHLORIDE 0.9% FLUSH
10.0000 mL | INTRAVENOUS | Status: DC | PRN
  Administered 2021-01-29: 10 mL via INTRAVENOUS

## 2021-01-29 MED ORDER — HEPARIN SOD (PORK) LOCK FLUSH 100 UNIT/ML IV SOLN
500.0000 [IU] | Freq: Once | INTRAVENOUS | Status: AC | PRN
  Administered 2021-01-29: 500 [IU] via INTRAVENOUS

## 2021-01-29 NOTE — Progress Notes (Signed)
[place] OFFICE PROGRESS NOTE   Diagnosis: Colon cancer  INTERVAL HISTORY:   Mr. Renteria returns as scheduled.  He completed cycle 3 FOLFOX 01/16/2021.  He denies significant nausea.  No mouth sores.  He had loose stools for 1 day.  Cold sensitivity lasted "a couple of days".  No persistent neuropathy symptoms.  He thinks the left axillary lymph node is smaller.  Objective:  Vital signs in last 24 hours:  Blood pressure (!) 147/73, pulse 83, temperature 98.1 F (36.7 C), temperature source Oral, resp. rate 18, height '6\' 4"'  (1.93 m), weight 250 lb 9.6 oz (113.7 kg), SpO2 100 %.    HEENT: No thrush or ulcers. Lymphatics: Mobile 3 cm left axillary lymph node.  Approximate 5 cm firm left inguinal node. Resp: Lungs clear bilaterally. Cardio: Regular rate and rhythm. GI: No hepatomegaly. Vascular: No leg edema. Neuro: Vibratory sense minimally decreased over the fingertips per tuning fork exam. Skin: Palms without erythema. Port-A-Cath without erythema.  Lab Results:  Lab Results  Component Value Date   WBC 3.7 (L) 01/29/2021   HGB 9.0 (L) 01/29/2021   HCT 28.8 (L) 01/29/2021   MCV 83.5 01/29/2021   PLT 112 (L) 01/29/2021   NEUTROABS 1.7 01/29/2021    Imaging:  No results found.  Medications: I have reviewed the patient's current medications.  Assessment/Plan: Sigmoid colon cancer, stage IV (B1Y,O0A,Y0K), isolated mesenteric implant-resected, multiple tumor deposits Sigmoid/descending resection and creation of a descending colostomy 04/10/2016 MSI-stable, no loss of mismatch repair protein expression Foundation 1- BRAF V600E positive, MS-stable, intermediate tumor mutation burden, no RAS mutation CT abdomen/pelvis 04/02/2016-no evidence of distant metastatic disease Cycle 1 FOLFOX 05/21/2016 Cycle 2 FOLFOX 06/04/2016 Cycle 3 FOLFOX 06/18/2016 Cycle 4 FOLFOX 07/02/2016 (oxaliplatin held secondary to thrombocytopenia) Cycle 5 FOLFOX 07/16/2016 Cycle 6  FOLFOX 07/30/2016 Cycle 7 FOLFOX 08/13/2016 (oxaliplatin held and 5-FU dose reduced) Cycle 8 FOLFOX 09/03/2016 (oxaliplatin held secondary to neuropathy) Cycle 9 FOLFOX 09/17/2016 (oxaliplatin held secondary to neuropathy) Cycle 10 FOLFOX 10/02/2016 Cycle 11 FOLFOX 10/15/2016 (oxaliplatin eliminated from the regimen) Cycle 12 FOLFOX 10/30/2016 (oxaliplatin eliminated) CTs 05/12/2017-no evidence of recurrent disease, right inguinal hernia containing bladder Colonoscopy 07/21/2017, 3 polyps were removed from the descending and transverse colon, fragments of tubular and tubulovillous adenoma CTs 05/19/2018-no evidence of recurrent disease, status post hernia repair, right upper lobe pneumonia CTs 05/20/2019-resolution of right upper lobe pneumonia, no evidence of metastatic disease Colonoscopy 01/25/2020-multiple polyps removed-tubular adenomas, poor preparation, repeat colonoscopy recommended CTs neck, chest, and abdomen/pelvis 08/27/2020- new 2 centimeter left axillary node, new 1.9 cm left inguinal node chronic mildly prominent portacaval node Ultrasound biopsy of left inguinal node on 10/29/2020-metastatic adenocarcinoma consistent with colon adenocarcinoma PET 5/99/[id number] hypermetabolic left inguinal and left axillary nodes, solitary focus of hypermetabolic activity in the right lower quadrant small bowel Cycle 1 FOLFOX 12/19/2020 Cycle 2 FOLFOX 01/02/2021, oxaliplatin dose reduced secondary to neutropenia and thrombocytopenia Cycle 3 FOLFOX 01/16/2021 Cycle 4 FOLFOX 01/30/2021   2.   Chronic renal insufficiency   3.   Hypertension   4.  Inflamed sebaceous cyst at the upper back-status post incision and drainage   5.  Thrombocytopenia secondary chemotherapy-oxaliplatin dose reduced beginning with cycle 3 FOLFOX, oxaliplatin held with cycle 4 and cycle 7 FOLFOX   6.  Oxaliplatin neuropathy-improved   7.  History of Mucositis secondary chemotherapy   8.  Symptoms of pneumonia January  2020-CT 05/19/2018 consistent with right upper lobe pneumonia, Levaquin prescribed 05/20/2018   9.  Ascending aortic dilatation  on CT 05/20/2019   10.  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 Gholson appears stable.  He has completed 3 cycles of FOLFOX.  Plan to proceed with cycle 4 as scheduled 01/30/2021.  He will undergo restaging CTs prior to the next office visit.  We reviewed the CBC from today.  Counts adequate to proceed with treatment as above.  He will return for lab, follow-up, FOLFOX in approximately 2 weeks.  He will contact the office in the interim with any problems.  Plan reviewed with Dr. Benay Spice.    Ned Card ANP/GNP-BC   01/29/2021  1:54 PM

## 2021-01-29 NOTE — Patient Instructions (Signed)
Implanted Port Home Guide An implanted port is a device that is placed under the skin. It is usually placed in the chest. The device can be used to give IV medicine, to take blood, or for dialysis. You may have an implanted port if: You need IV medicine that would be irritating to the small veins in your hands or arms. You need IV medicines, such as antibiotics, for a long period of time. You need IV nutrition for a long period of time. You need dialysis. When you have a port, your health care provider can choose to use the port instead of veins in your arms for these procedures. You may have fewer limitations when using a port than you would if you used other types of long-term IVs, and you will likely be able to return to normal activities after your incision heals. An implanted port has two main parts: Reservoir. The reservoir is the part where a needle is inserted to give medicines or draw blood. The reservoir is round. After it is placed, it appears as a small, raised area under your skin. Catheter. The catheter is a thin, flexible tube that connects the reservoir to a vein. Medicine that is inserted into the reservoir goes into the catheter and then into the vein. How is my port accessed? To access your port: A numbing cream may be placed on the skin over the port site. Your health care provider will put on a mask and sterile gloves. The skin over your port will be cleaned carefully with a germ-killing soap and allowed to dry. Your health care provider will gently pinch the port and insert a needle into it. Your health care provider will check for a blood return to make sure the port is in the vein and is not clogged. If your port needs to remain accessed to get medicine continuously (constant infusion), your health care provider will place a clear bandage (dressing) over the needle site. The dressing and needle will need to be changed every week, or as told by your health care provider. What  is flushing? Flushing helps keep the port from getting clogged. Follow instructions from your health care provider about how and when to flush the port. Ports are usually flushed with saline solution or a medicine called heparin. The need for flushing will depend on how the port is used: If the port is only used from time to time to give medicines or draw blood, the port may need to be flushed: Before and after medicines have been given. Before and after blood has been drawn. As part of routine maintenance. Flushing may be recommended every 4-6 weeks. If a constant infusion is running, the port may not need to be flushed. Throw away any syringes in a disposal container that is meant for sharp items (sharps container). You can buy a sharps container from a pharmacy, or you can make one by using an empty hard plastic bottle with a cover. How long will my port stay implanted? The port can stay in for as long as your health care provider thinks it is needed. When it is time for the port to come out, a surgery will be done to remove it. The surgery will be similar to the procedure that was done to put the port in. Follow these instructions at home:  Flush your port as told by your health care provider. If you need an infusion over several days, follow instructions from your health care provider about how   to take care of your port site. Make sure you: Wash your hands with soap and water before you change your dressing. If soap and water are not available, use alcohol-based hand sanitizer. Change your dressing as told by your health care provider. Place any used dressings or infusion bags into a plastic bag. Throw that bag in the trash. Keep the dressing that covers the needle clean and dry. Do not get it wet. Do not use scissors or sharp objects near the tube. Keep the tube clamped, unless it is being used. Check your port site every day for signs of infection. Check for: Redness, swelling, or  pain. Fluid or blood. Pus or a bad smell. Protect the skin around the port site. Avoid wearing bra straps that rub or irritate the site. Protect the skin around your port from seat belts. Place a soft pad over your chest if needed. Bathe or shower as told by your health care provider. The site may get wet as long as you are not actively receiving an infusion. Return to your normal activities as told by your health care provider. Ask your health care provider what activities are safe for you. Carry a medical alert card or wear a medical alert bracelet at all times. This will let health care providers know that you have an implanted port in case of an emergency. Get help right away if: You have redness, swelling, or pain at the port site. You have fluid or blood coming from your port site. You have pus or a bad smell coming from the port site. You have a fever. Summary Implanted ports are usually placed in the chest for long-term IV access. Follow instructions from your health care provider about flushing the port and changing bandages (dressings). Take care of the area around your port by avoiding clothing that puts pressure on the area, and by watching for signs of infection. Protect the skin around your port from seat belts. Place a soft pad over your chest if needed. Get help right away if you have a fever or you have redness, swelling, pain, drainage, or a bad smell at the port site. This information is not intended to replace advice given to you by your health care provider. Make sure you discuss any questions you have with your health care provider. Document Revised: 07/11/2020 Document Reviewed: 09/05/2019 [employer] Patient Education  2022 [employer]  

## 2021-01-30 ENCOUNTER — Inpatient Hospital Stay: Payer: Medicare Other

## 2021-01-30 ENCOUNTER — Other Ambulatory Visit: Payer: Self-pay | Admitting: *Deleted

## 2021-01-30 ENCOUNTER — Inpatient Hospital Stay: Payer: Medicare Other | Admitting: Nurse Practitioner

## 2021-01-30 VITALS — BP 144/74 | HR 66 | Temp 97.6°F | Resp 18

## 2021-01-30 DIAGNOSIS — C189 Malignant neoplasm of colon, unspecified: Secondary | ICD-10-CM

## 2021-01-30 DIAGNOSIS — I129 Hypertensive chronic kidney disease with stage 1 through stage 4 chronic kidney disease, or unspecified chronic kidney disease: Secondary | ICD-10-CM | POA: Diagnosis not present

## 2021-01-30 DIAGNOSIS — N189 Chronic kidney disease, unspecified: Secondary | ICD-10-CM | POA: Diagnosis not present

## 2021-01-30 DIAGNOSIS — Z5111 Encounter for antineoplastic chemotherapy: Secondary | ICD-10-CM | POA: Diagnosis not present

## 2021-01-30 DIAGNOSIS — G620 Drug-induced polyneuropathy: Secondary | ICD-10-CM | POA: Diagnosis not present

## 2021-01-30 DIAGNOSIS — D6959 Other secondary thrombocytopenia: Secondary | ICD-10-CM | POA: Diagnosis not present

## 2021-01-30 DIAGNOSIS — C187 Malignant neoplasm of sigmoid colon: Secondary | ICD-10-CM | POA: Diagnosis not present

## 2021-01-30 MED ORDER — SODIUM CHLORIDE 0.9 % IV SOLN
1800.0000 mg/m2 | INTRAVENOUS | Status: DC
  Administered 2021-01-30: 4450 mg via INTRAVENOUS
  Filled 2021-01-30: qty 89

## 2021-01-30 MED ORDER — FLUOROURACIL CHEMO INJECTION 2.5 GM/50ML
300.0000 mg/m2 | Freq: Once | INTRAVENOUS | Status: AC
  Administered 2021-01-30: 750 mg via INTRAVENOUS
  Filled 2021-01-30: qty 15

## 2021-01-30 MED ORDER — SODIUM CHLORIDE 0.9 % IV SOLN
10.0000 mg | Freq: Once | INTRAVENOUS | Status: AC
  Administered 2021-01-30: 10 mg via INTRAVENOUS
  Filled 2021-01-30: qty 1

## 2021-01-30 MED ORDER — LEUCOVORIN CALCIUM INJECTION 350 MG
300.0000 mg/m2 | Freq: Once | INTRAVENOUS | Status: AC
  Administered 2021-01-30: 742 mg via INTRAVENOUS
  Filled 2021-01-30: qty 37.1

## 2021-01-30 MED ORDER — PALONOSETRON HCL INJECTION 0.25 MG/5ML
0.2500 mg | Freq: Once | INTRAVENOUS | Status: AC
  Administered 2021-01-30: 0.25 mg via INTRAVENOUS
  Filled 2021-01-30: qty 5

## 2021-01-30 MED ORDER — DEXTROSE 5 % IV SOLN: Freq: Once | INTRAVENOUS | Status: AC

## 2021-01-30 MED ORDER — OXALIPLATIN CHEMO INJECTION 100 MG/20ML
52.0000 mg/m2 | Freq: Once | INTRAVENOUS | Status: AC
  Administered 2021-01-30: 130 mg via INTRAVENOUS
  Filled 2021-01-30: qty 10

## 2021-01-30 NOTE — Patient Instructions (Signed)
[facility]   Discharge Instructions: Thank you for choosing [facility] to provide your oncology and hematology care.   If you have a lab appointment with the [facility], please go directly to the [facility] and check in at the registration area.   Wear comfortable clothing and clothing appropriate for easy access to any Portacath or PICC line.   We strive to give you quality time with your provider. You may need to reschedule your appointment if you arrive late (15 or more minutes).  Arriving late affects you and other patients whose appointments are after yours.  Also, if you miss three or more appointments without notifying the office, you may be dismissed from the clinic at the provider's discretion.      For prescription refill requests, have your pharmacy contact our office and allow 72 hours for refills to be completed.    Today you received the following chemotherapy and/or immunotherapy agents Oxaliplatin (ELOXATIN), Leucovorin, Flourouracil (ADRUCIL).      To help prevent nausea and vomiting after your treatment, we encourage you to take your nausea medication as directed.  BELOW ARE SYMPTOMS THAT SHOULD BE REPORTED IMMEDIATELY: *FEVER GREATER THAN 100.4 F (38 C) OR HIGHER *CHILLS OR SWEATING *NAUSEA AND VOMITING THAT IS NOT CONTROLLED WITH YOUR NAUSEA MEDICATION *UNUSUAL SHORTNESS OF BREATH *UNUSUAL BRUISING OR BLEEDING *URINARY PROBLEMS (pain or burning when urinating, or frequent urination) *BOWEL PROBLEMS (unusual diarrhea, constipation, pain near the anus) TENDERNESS IN MOUTH AND THROAT WITH OR WITHOUT PRESENCE OF ULCERS (sore throat, sores in mouth, or a toothache) UNUSUAL RASH, SWELLING OR PAIN  UNUSUAL VAGINAL DISCHARGE OR ITCHING   Items with * indicate a potential emergency and should be followed up as soon as possible or go to the Emergency Department if any problems should occur.  Please show the CHEMOTHERAPY ALERT  CARD or IMMUNOTHERAPY ALERT CARD at check-in to the Emergency Department and triage nurse.  Should you have questions after your visit or need to cancel or reschedule your appointment, please contact [facility]  Dept: [phone]  and follow the prompts.  Office hours are 8:00 a.m. to 4:30 p.m. Monday - Friday. Please note that voicemails left after 4:00 p.m. may not be returned until the following business day.  We are closed weekends and major holidays. You have access to a nurse at all times for urgent questions. Please call the main number to the clinic Dept: [phone] and follow the prompts.   For any non-urgent questions, you may also contact your provider using MyChart. We now offer e-Visits for anyone 70 and older to request care online for non-urgent symptoms. For details visit [email or web address].   Also download the MyChart app! Go to the app store, search "MyChart", open the app, select [facility], and log in with your MyChart username and password.  Due to Covid, a mask is required upon entering the hospital/clinic. If you do not have a mask, one will be given to you upon arrival. For doctor visits, patients may have 1 support person aged 29 or older with them. For treatment visits, patients cannot have anyone with them due to current Covid guidelines and our immunocompromised population.   Oxaliplatin Injection What is this medication? OXALIPLATIN (ox AL i PLA tin) is a chemotherapy drug. It targets fast dividing cells, like cancer cells, and causes these cells to die. This medicine is used to treat cancers of the colon and rectum, and many  other cancers. This medicine may be used for other purposes; ask your health care provider or pharmacist if you have questions. COMMON BRAND NAME(S): Eloxatin What should I tell my care team before I take this medication? They need to know if you have any of these conditions: heart disease history of irregular  heartbeat liver disease low blood counts, like white cells, platelets, or red blood cells lung or breathing disease, like asthma take medicines that treat or prevent blood clots tingling of the fingers or toes, or other nerve disorder an unusual or allergic reaction to oxaliplatin, other chemotherapy, other medicines, foods, dyes, or preservatives pregnant or trying to get pregnant breast-feeding How should I use this medication? This drug is given as an infusion into a vein. It is administered in a hospital or clinic by a specially trained health care professional. Talk to your pediatrician regarding the use of this medicine in children. Special care may be needed. Overdosage: If you think you have taken too much of this medicine contact a poison control center or emergency room at once. NOTE: This medicine is only for you. Do not share this medicine with others. What if I miss a dose? It is important not to miss a dose. Call your doctor or health care professional if you are unable to keep an appointment. What may interact with this medication? Do not take this medicine with any of the following medications: cisapride dronedarone pimozide thioridazine This medicine may also interact with the following medications: aspirin and aspirin-like medicines certain medicines that treat or prevent blood clots like warfarin, apixaban, dabigatran, and rivaroxaban cisplatin cyclosporine diuretics medicines for infection like acyclovir, adefovir, amphotericin B, bacitracin, cidofovir, foscarnet, ganciclovir, gentamicin, pentamidine, vancomycin NSAIDs, medicines for pain and inflammation, like ibuprofen or naproxen other medicines that prolong the QT interval (an abnormal heart rhythm) pamidronate zoledronic acid This list may not describe all possible interactions. Give your health care provider a list of all the medicines, herbs, non-prescription drugs, or dietary supplements you use. Also tell  them if you smoke, drink alcohol, or use illegal drugs. Some items may interact with your medicine. What should I watch for while using this medication? Your condition will be monitored carefully while you are receiving this medicine. You may need blood work done while you are taking this medicine. This medicine may make you feel generally unwell. This is not uncommon as chemotherapy can affect healthy cells as well as cancer cells. Report any side effects. Continue your course of treatment even though you feel ill unless your healthcare professional tells you to stop. This medicine can make you more sensitive to cold. Do not drink cold drinks or use ice. Cover exposed skin before coming in contact with cold temperatures or cold objects. When out in cold weather wear warm clothing and cover your mouth and nose to warm the air that goes into your lungs. Tell your doctor if you get sensitive to the cold. Do not become pregnant while taking this medicine or for 9 months after stopping it. Women should inform their health care professional if they wish to become pregnant or think they might be pregnant. Men should not father a child while taking this medicine and for 6 months after stopping it. There is potential for serious side effects to an unborn child. Talk to your health care professional for more information. Do not breast-feed a child while taking this medicine or for 3 months after stopping it. This medicine has caused ovarian failure in some  women. This medicine may make it more difficult to get pregnant. Talk to your health care professional if you are concerned about your fertility. This medicine has caused decreased sperm counts in some men. This may make it more difficult to father a child. Talk to your health care professional if you are concerned about your fertility. This medicine may increase your risk of getting an infection. Call your health care professional for advice if you get a fever,  chills, or sore throat, or other symptoms of a cold or flu. Do not treat yourself. Try to avoid being around people who are sick. Avoid taking medicines that contain aspirin, acetaminophen, ibuprofen, naproxen, or ketoprofen unless instructed by your health care professional. These medicines may hide a fever. Be careful brushing or flossing your teeth or using a toothpick because you may get an infection or bleed more easily. If you have any dental work done, tell your dentist you are receiving this medicine. What side effects may I notice from receiving this medication? Side effects that you should report to your doctor or health care professional as soon as possible: allergic reactions like skin rash, itching or hives, swelling of the face, lips, or tongue breathing problems cough low blood counts - this medicine may decrease the number of white blood cells, red blood cells, and platelets. You may be at increased risk for infections and bleeding nausea, vomiting pain, redness, or irritation at site where injected pain, tingling, numbness in the hands or feet signs and symptoms of bleeding such as bloody or black, tarry stools; red or dark brown urine; spitting up blood or brown material that looks like coffee grounds; red spots on the skin; unusual bruising or bleeding from the eyes, gums, or nose signs and symptoms of a dangerous change in heartbeat or heart rhythm like chest pain; dizziness; fast, irregular heartbeat; palpitations; feeling faint or lightheaded; falls signs and symptoms of infection like fever; chills; cough; sore throat; pain or trouble passing urine signs and symptoms of liver injury like dark yellow or brown urine; general ill feeling or flu-like symptoms; light-colored stools; loss of appetite; nausea; right upper belly pain; unusually weak or tired; yellowing of the eyes or skin signs and symptoms of low red blood cells or anemia such as unusually weak or tired; feeling faint  or lightheaded; falls signs and symptoms of muscle injury like dark urine; trouble passing urine or change in the amount of urine; unusually weak or tired; muscle pain; back pain Side effects that usually do not require medical attention (report to your doctor or health care professional if they continue or are bothersome): changes in taste diarrhea gas hair loss loss of appetite mouth sores This list may not describe all possible side effects. Call your doctor for medical advice about side effects. You may report side effects to FDA at 1-800-FDA-1088. Where should I keep my medication? This drug is given in a hospital or clinic and will not be stored at home. NOTE: This sheet is a summary. It may not cover all possible information. If you have questions about this medicine, talk to your doctor, pharmacist, or health care provider.  2022 Elsevier/Gold Standard (2018-09-08 12:20:35)  Leucovorin injection What is this medication? LEUCOVORIN (loo koe VOR in) is used to prevent or treat the harmful effects of some medicines. This medicine is used to treat anemia caused by a low amount of folic acid in the body. It is also used with 5-fluorouracil (5-FU) to treat colon cancer.  This medicine may be used for other purposes; ask your health care provider or pharmacist if you have questions. What should I tell my care team before I take this medication? They need to know if you have any of these conditions: anemia from low levels of vitamin B-12 in the blood an unusual or allergic reaction to leucovorin, folic acid, other medicines, foods, dyes, or preservatives pregnant or trying to get pregnant breast-feeding How should I use this medication? This medicine is for injection into a muscle or into a vein. It is given by a health care professional in a hospital or clinic setting. Talk to your pediatrician regarding the use of this medicine in children. Special care may be needed. Overdosage: If you  think you have taken too much of this medicine contact a poison control center or emergency room at once. NOTE: This medicine is only for you. Do not share this medicine with others. What if I miss a dose? This does not apply. What may interact with this medication? capecitabine fluorouracil phenobarbital phenytoin primidone trimethoprim-sulfamethoxazole This list may not describe all possible interactions. Give your health care provider a list of all the medicines, herbs, non-prescription drugs, or dietary supplements you use. Also tell them if you smoke, drink alcohol, or use illegal drugs. Some items may interact with your medicine. What should I watch for while using this medication? Your condition will be monitored carefully while you are receiving this medicine. This medicine may increase the side effects of 5-fluorouracil, 5-FU. Tell your doctor or health care professional if you have diarrhea or mouth sores that do not get better or that get worse. What side effects may I notice from receiving this medication? Side effects that you should report to your doctor or health care professional as soon as possible: allergic reactions like skin rash, itching or hives, swelling of the face, lips, or tongue breathing problems fever, infection mouth sores unusual bleeding or bruising unusually weak or tired Side effects that usually do not require medical attention (report to your doctor or health care professional if they continue or are bothersome): constipation or diarrhea loss of appetite nausea, vomiting This list may not describe all possible side effects. Call your doctor for medical advice about side effects. You may report side effects to FDA at 1-800-FDA-1088. Where should I keep my medication? This drug is given in a hospital or clinic and will not be stored at home. NOTE: This sheet is a summary. It may not cover all possible information. If you have questions about this  medicine, talk to your doctor, pharmacist, or health care provider.  2022 Elsevier/Gold Standard (2007-10-26 16:50:29)  Fluorouracil, 5-FU injection What is this medication? FLUOROURACIL, 5-FU (flure oh YOOR a sil) is a chemotherapy drug. It slows the growth of cancer cells. This medicine is used to treat many types of cancer like breast cancer, colon or rectal cancer, pancreatic cancer, and stomach cancer. This medicine may be used for other purposes; ask your health care provider or pharmacist if you have questions. COMMON BRAND NAME(S): Adrucil What should I tell my care team before I take this medication? They need to know if you have any of these conditions: blood disorders dihydropyrimidine dehydrogenase (DPD) deficiency infection (especially a virus infection such as chickenpox, cold sores, or herpes) kidney disease liver disease malnourished, poor nutrition recent or ongoing radiation therapy an unusual or allergic reaction to fluorouracil, other chemotherapy, other medicines, foods, dyes, or preservatives pregnant or trying to get pregnant breast-feeding  How should I use this medication? This drug is given as an infusion or injection into a vein. It is administered in a hospital or clinic by a specially trained health care professional. Talk to your pediatrician regarding the use of this medicine in children. Special care may be needed. Overdosage: If you think you have taken too much of this medicine contact a poison control center or emergency room at once. NOTE: This medicine is only for you. Do not share this medicine with others. What if I miss a dose? It is important not to miss your dose. Call your doctor or health care professional if you are unable to keep an appointment. What may interact with this medication? Do not take this medicine with any of the following medications: live virus vaccines This medicine may also interact with the following medications: medicines  that treat or prevent blood clots like warfarin, enoxaparin, and dalteparin This list may not describe all possible interactions. Give your health care provider a list of all the medicines, herbs, non-prescription drugs, or dietary supplements you use. Also tell them if you smoke, drink alcohol, or use illegal drugs. Some items may interact with your medicine. What should I watch for while using this medication? Visit your doctor for checks on your progress. This drug may make you feel generally unwell. This is not uncommon, as chemotherapy can affect healthy cells as well as cancer cells. Report any side effects. Continue your course of treatment even though you feel ill unless your doctor tells you to stop. In some cases, you may be given additional medicines to help with side effects. Follow all directions for their use. Call your doctor or health care professional for advice if you get a fever, chills or sore throat, or other symptoms of a cold or flu. Do not treat yourself. This drug decreases your body's ability to fight infections. Try to avoid being around people who are sick. This medicine may increase your risk to bruise or bleed. Call your doctor or health care professional if you notice any unusual bleeding. Be careful brushing and flossing your teeth or using a toothpick because you may get an infection or bleed more easily. If you have any dental work done, tell your dentist you are receiving this medicine. Avoid taking products that contain aspirin, acetaminophen, ibuprofen, naproxen, or ketoprofen unless instructed by your doctor. These medicines may hide a fever. Do not become pregnant while taking this medicine. Women should inform their doctor if they wish to become pregnant or think they might be pregnant. There is a potential for serious side effects to an unborn child. Talk to your health care professional or pharmacist for more information. Do not breast-feed an infant while taking  this medicine. Men should inform their doctor if they wish to father a child. This medicine may lower sperm counts. Do not treat diarrhea with over the counter products. Contact your doctor if you have diarrhea that lasts more than 2 days or if it is severe and watery. This medicine can make you more sensitive to the sun. Keep out of the sun. If you cannot avoid being in the sun, wear protective clothing and use sunscreen. Do not use sun lamps or tanning beds/booths. What side effects may I notice from receiving this medication? Side effects that you should report to your doctor or health care professional as soon as possible: allergic reactions like skin rash, itching or hives, swelling of the face, lips, or tongue low blood counts -  this medicine may decrease the number of white blood cells, red blood cells and platelets. You may be at increased risk for infections and bleeding. signs of infection - fever or chills, cough, sore throat, pain or difficulty passing urine signs of decreased platelets or bleeding - bruising, pinpoint red spots on the skin, black, tarry stools, blood in the urine signs of decreased red blood cells - unusually weak or tired, fainting spells, lightheadedness breathing problems changes in vision chest pain mouth sores nausea and vomiting pain, swelling, redness at site where injected pain, tingling, numbness in the hands or feet redness, swelling, or sores on hands or feet stomach pain unusual bleeding Side effects that usually do not require medical attention (report to your doctor or health care professional if they continue or are bothersome): changes in finger or toe nails diarrhea dry or itchy skin hair loss headache loss of appetite sensitivity of eyes to the light stomach upset unusually teary eyes This list may not describe all possible side effects. Call your doctor for medical advice about side effects. You may report side effects to FDA at  1-800-FDA-1088. Where should I keep my medication? This drug is given in a hospital or clinic and will not be stored at home. NOTE: This sheet is a summary. It may not cover all possible information. If you have questions about this medicine, talk to your doctor, pharmacist, or health care provider.  2022 Elsevier/Gold Standard (2019-03-22 15:00:03)  The chemotherapy medication bag should finish at 46 hours, 96 hours, or 7 days. For example, if your pump is scheduled for 46 hours and it was put on at 4:00 p.m., it should finish at 2:00 p.m. the day it is scheduled to come off regardless of your appointment time.     Estimated time to finish at 10:30 a.m. on Friday 02/01/2021.   If the display on your pump reads "Low Volume" and it is beeping, take the batteries out of the pump and come to the cancer center for it to be taken off.   If the pump alarms go off prior to the pump reading "Low Volume" then call [phone] and someone can assist you.  If the plunger comes out and the chemotherapy medication is leaking out, please use your home chemo spill kit to clean up the spill. Do NOT use paper towels or other household products.  If you have problems or questions regarding your pump, please call either [phone] (24 hours a day) or the cancer center Monday-Friday 8:00 a.m.- 4:30 p.m. at the clinic number and we will assist you. If you are unable to get assistance, then go to the nearest Emergency Department and ask the staff to contact the IV team for assistance.

## 2021-01-30 NOTE — Progress Notes (Signed)
Patient presents for treatment. RN assessment completed along with the following:  Labs/vitals reviewed - Yes, and Scr. 2.38, okay to proceed per Lattie Haw, NP    Weight within 10% of previous measurement - Yes Oncology Treatment Attestation completed for current therapy- Yes, on date 12/05/2020 Informed consent completed and reflects current therapy/intent - Yes, on date 12/19/2020             Provider progress note reviewed - Yes, today's provider note was reviewed. Treatment/Antibody/Supportive plan reviewed - Yes, and there are no adjustments needed for today's treatment. S&H and other orders reviewed - Yes, and there are no additional orders identified. Previous treatment date reviewed - Yes, and the appropriate amount of time has elapsed between treatments. Clinic Hand Off Received from - No.  Patient to proceed with treatment.

## 2021-02-01 ENCOUNTER — Other Ambulatory Visit: Payer: Self-pay

## 2021-02-01 ENCOUNTER — Inpatient Hospital Stay: Payer: Medicare Other

## 2021-02-01 VITALS — BP 135/63 | HR 63 | Temp 98.4°F | Resp 18

## 2021-02-01 DIAGNOSIS — G620 Drug-induced polyneuropathy: Secondary | ICD-10-CM | POA: Diagnosis not present

## 2021-02-01 DIAGNOSIS — C187 Malignant neoplasm of sigmoid colon: Secondary | ICD-10-CM | POA: Diagnosis not present

## 2021-02-01 DIAGNOSIS — I129 Hypertensive chronic kidney disease with stage 1 through stage 4 chronic kidney disease, or unspecified chronic kidney disease: Secondary | ICD-10-CM | POA: Diagnosis not present

## 2021-02-01 DIAGNOSIS — D6959 Other secondary thrombocytopenia: Secondary | ICD-10-CM | POA: Diagnosis not present

## 2021-02-01 DIAGNOSIS — C189 Malignant neoplasm of colon, unspecified: Secondary | ICD-10-CM

## 2021-02-01 DIAGNOSIS — Z5111 Encounter for antineoplastic chemotherapy: Secondary | ICD-10-CM | POA: Diagnosis not present

## 2021-02-01 DIAGNOSIS — N189 Chronic kidney disease, unspecified: Secondary | ICD-10-CM | POA: Diagnosis not present

## 2021-02-01 MED ORDER — HEPARIN SOD (PORK) LOCK FLUSH 100 UNIT/ML IV SOLN
500.0000 [IU] | Freq: Once | INTRAVENOUS | Status: AC | PRN
  Administered 2021-02-01: 500 [IU]

## 2021-02-01 MED ORDER — SODIUM CHLORIDE 0.9% FLUSH
10.0000 mL | INTRAVENOUS | Status: DC | PRN
  Administered 2021-02-01: 10 mL

## 2021-02-09 ENCOUNTER — Other Ambulatory Visit: Payer: Self-pay | Admitting: Oncology

## 2021-02-11 ENCOUNTER — Ambulatory Visit (HOSPITAL_BASED_OUTPATIENT_CLINIC_OR_DEPARTMENT_OTHER)
Admission: RE | Admit: 2021-02-11 | Discharge: 2021-02-11 | Disposition: A | Discharge status: Home / Self Care | Payer: Medicare Other | Source: Ambulatory Visit | Attending: Nurse Practitioner | Admitting: Nurse Practitioner

## 2021-02-11 ENCOUNTER — Other Ambulatory Visit: Payer: Self-pay

## 2021-02-11 DIAGNOSIS — R590 Localized enlarged lymph nodes: Secondary | ICD-10-CM | POA: Diagnosis not present

## 2021-02-11 DIAGNOSIS — I700 Atherosclerosis of aorta: Secondary | ICD-10-CM | POA: Diagnosis not present

## 2021-02-11 DIAGNOSIS — N400 Enlarged prostate without lower urinary tract symptoms: Secondary | ICD-10-CM | POA: Diagnosis not present

## 2021-02-11 DIAGNOSIS — C189 Malignant neoplasm of colon, unspecified: Secondary | ICD-10-CM

## 2021-02-11 DIAGNOSIS — K6389 Other specified diseases of intestine: Secondary | ICD-10-CM | POA: Diagnosis not present

## 2021-02-13 ENCOUNTER — Inpatient Hospital Stay: Payer: Medicare Other

## 2021-02-13 ENCOUNTER — Other Ambulatory Visit: Payer: Self-pay

## 2021-02-13 ENCOUNTER — Inpatient Hospital Stay: Payer: Medicare Other | Attending: Oncology | Admitting: Oncology

## 2021-02-13 VITALS — BP 136/61 | HR 85 | Temp 97.7°F | Resp 18 | Ht 76.0 in | Wt 248.0 lb

## 2021-02-13 DIAGNOSIS — Z452 Encounter for adjustment and management of vascular access device: Secondary | ICD-10-CM | POA: Diagnosis not present

## 2021-02-13 DIAGNOSIS — N189 Chronic kidney disease, unspecified: Secondary | ICD-10-CM | POA: Insufficient documentation

## 2021-02-13 DIAGNOSIS — C189 Malignant neoplasm of colon, unspecified: Secondary | ICD-10-CM

## 2021-02-13 DIAGNOSIS — Z5111 Encounter for antineoplastic chemotherapy: Secondary | ICD-10-CM | POA: Diagnosis not present

## 2021-02-13 DIAGNOSIS — C187 Malignant neoplasm of sigmoid colon: Secondary | ICD-10-CM | POA: Insufficient documentation

## 2021-02-13 DIAGNOSIS — D696 Thrombocytopenia, unspecified: Secondary | ICD-10-CM | POA: Diagnosis not present

## 2021-02-13 DIAGNOSIS — G620 Drug-induced polyneuropathy: Secondary | ICD-10-CM | POA: Insufficient documentation

## 2021-02-13 DIAGNOSIS — I129 Hypertensive chronic kidney disease with stage 1 through stage 4 chronic kidney disease, or unspecified chronic kidney disease: Secondary | ICD-10-CM | POA: Insufficient documentation

## 2021-02-13 DIAGNOSIS — Z5112 Encounter for antineoplastic immunotherapy: Secondary | ICD-10-CM | POA: Insufficient documentation

## 2021-02-13 LAB — CBC WITH DIFFERENTIAL (CANCER CENTER ONLY)
Abs Immature Granulocytes: 0.01 10*3/uL (ref 0.00–0.07)
Basophils Absolute: 0 10*3/uL (ref 0.0–0.1)
Basophils Relative: 1 %
Eosinophils Absolute: 0.2 10*3/uL (ref 0.0–0.5)
Eosinophils Relative: 5 %
HCT: 28.9 % — ABNORMAL LOW (ref 39.0–52.0)
Hemoglobin: 9 g/dL — ABNORMAL LOW (ref 13.0–17.0)
Immature Granulocytes: 0 %
Lymphocytes Relative: 26 %
Lymphs Abs: 1 10*3/uL (ref 0.7–4.0)
MCH: 25.9 pg — ABNORMAL LOW (ref 26.0–34.0)
MCHC: 31.1 g/dL (ref 30.0–36.0)
MCV: 83.3 fL (ref 80.0–100.0)
Monocytes Absolute: 0.6 10*3/uL (ref 0.1–1.0)
Monocytes Relative: 14 %
Neutro Abs: 2.1 10*3/uL (ref 1.7–7.7)
Neutrophils Relative %: 54 %
Platelet Count: 96 10*3/uL — ABNORMAL LOW (ref 150–400)
RBC: 3.47 MIL/uL — ABNORMAL LOW (ref 4.22–5.81)
RDW: 17.7 % — ABNORMAL HIGH (ref 11.5–15.5)
WBC Count: 3.9 10*3/uL — ABNORMAL LOW (ref 4.0–10.5)
nRBC: 0 % (ref 0.0–0.2)

## 2021-02-13 LAB — CMP (CANCER CENTER ONLY)
ALT: 12 U/L (ref 0–44)
AST: 11 U/L — ABNORMAL LOW (ref 15–41)
Albumin: 4 g/dL (ref 3.5–5.0)
Alkaline Phosphatase: 92 U/L (ref 38–126)
Anion gap: 10 (ref 5–15)
BUN: 33 mg/dL — ABNORMAL HIGH (ref 8–23)
CO2: 20 mmol/L — ABNORMAL LOW (ref 22–32)
Calcium: 8.7 mg/dL — ABNORMAL LOW (ref 8.9–10.3)
Chloride: 110 mmol/L (ref 98–111)
Creatinine: 2.45 mg/dL — ABNORMAL HIGH (ref 0.61–1.24)
GFR, Estimated: 27 mL/min — ABNORMAL LOW (ref >60)
Glucose, Bld: 147 mg/dL — ABNORMAL HIGH (ref 70–99)
Potassium: 3.7 mmol/L (ref 3.5–5.1)
Sodium: 140 mmol/L (ref 135–145)
Total Bilirubin: 0.5 mg/dL (ref 0.3–1.2)
Total Protein: 6.6 g/dL (ref 6.5–8.1)

## 2021-02-13 NOTE — Progress Notes (Signed)
[facility] OFFICE PROGRESS NOTE   Diagnosis: Colon cancer  INTERVAL HISTORY:   Louis [name] completed another cycle of FOLFOX on 01/22/2021.  He reports 2 days of diarrhea beginning on day 3.  He had prolonged cold sensitivity following this cycle of chemotherapy.  He feels the left axillary mass is smaller.  He feels well at present.  Objective:  Vital signs in last 24 hours:  Blood pressure 136/61, pulse 85, temperature 97.7 F (36.5 C), temperature source Oral, resp. rate 18, height 6' 4" (1.93 m), weight 248 lb (112.5 kg), SpO2 98 %.    HEENT: No thrush or ulcers Lymphatics: 2-3 cm mobile left axillary node, 4-5 cm high lateral left inguinal node Resp: Lungs clear bilaterally Cardio: Regular rate and rhythm GI: No hepatosplenomegaly, nontender, no mass Vascular: No leg edema  Skin: Palms without erythema  Portacath/PICC-without erythema  Lab Results:  Lab Results  Component Value Date   WBC 3.9 (L) 02/13/2021   HGB 9.0 (L) 02/13/2021   HCT 28.9 (L) 02/13/2021   MCV 83.3 02/13/2021   PLT 96 (L) 02/13/2021   NEUTROABS 2.1 02/13/2021    CMP  Lab Results  Component Value Date   NA 140 02/13/2021   K 3.7 02/13/2021   CL 110 02/13/2021   CO2 20 (L) 02/13/2021   GLUCOSE 147 (H) 02/13/2021   BUN 33 (H) 02/13/2021   CREATININE 2.45 (H) 02/13/2021   CALCIUM 8.7 (L) 02/13/2021   PROT 6.6 02/13/2021   ALBUMIN 4.0 02/13/2021   AST 11 (L) 02/13/2021   ALT 12 02/13/2021   ALKPHOS 92 02/13/2021   BILITOT 0.5 02/13/2021   GFRNONAA 27 (L) 02/13/2021   GFRAA 28 (L) 09/20/2019    Lab Results  Component Value Date   CEA1 2.61 01/02/2021   CEA 3.76 01/02/2021    Lab Results  Component Value Date   INR 0.9 10/11/2020   LABPROT 12.6 10/11/2020    Imaging:  CT CHEST ABDOMEN PELVIS WO CONTRAST  Result Date: 02/11/2021 CLINICAL DATA:  Metastatic colon cancer, assess treatment response EXAM: CT CHEST, ABDOMEN AND PELVIS WITHOUT CONTRAST TECHNIQUE:  Multidetector CT imaging of the chest, abdomen and pelvis was performed following the standard protocol without IV contrast. Oral enteric contrast was administered. COMPARISON:  11/12/2020 FINDINGS: CT CHEST FINDINGS Cardiovascular: Right chest port catheter. Aortic atherosclerosis. Normal heart size. No pericardial effusion. Mediastinum/Nodes: Unchanged enlargement of a left axillary lymph node measuring 2.6 x 1.8 cm (series 2, image 35). No other enlarged mediastinal, hilar, or axillary lymph nodes. Thyroid gland, trachea, and esophagus demonstrate no significant findings. Lungs/Pleura: Lungs are clear. No pleural effusion or pneumothorax. Musculoskeletal: No chest wall mass or suspicious bone lesions identified. Bilateral gynecomastia. CT ABDOMEN PELVIS FINDINGS Hepatobiliary: No solid liver abnormality is seen. No gallstones, gallbladder wall thickening, or biliary dilatation. Pancreas: Unremarkable. No pancreatic ductal dilatation or surrounding inflammatory changes. Spleen: Normal in size without significant abnormality. Adrenals/Urinary Tract: Adrenal glands are unremarkable. Benign exophytic bilateral renal cysts. Kidneys are otherwise normal, without renal calculi, solid lesion, or hydronephrosis. Thickening of the urinary bladder wall, likely due to chronic outlet obstruction. Stomach/Bowel: Stomach is within normal limits. Appendix appears normal. There is mild, circumferential bowel wall of thickening of a loop of distal ileum in the right lower quadrant (series 2, image 117). Vascular/Lymphatic: Aortic atherosclerosis. Unchanged enlargement of a left inguinal lymph node, measuring 3.8 x 2.5 cm and previously FDG avid (series 2, image 128). Reproductive: Prostatomegaly with TURP defect. Other: Evidence of   prior right inguinal hernia repair. No abdominopelvic ascites. Musculoskeletal: No acute or significant osseous findings. IMPRESSION: 1. Unchanged enlargement of a left inguinal lymph node, previously  FDG avid. 2. Unchanged enlargement of a left axillary lymph node, previously FDG avid. 3. No noncontrast CT evidence of new metastatic disease in the chest, abdomen or pelvis. 4. There is mild, circumferential bowel wall of thickening of a loop of distal ileum in the right lower quadrant, similar to prior examination and FDG avid at that time. This remains of uncertain nature, possibly infectious or inflammatory enteritis, although malignancy not excluded. 5. Prostatomegaly with TURP defect. Aortic Atherosclerosis (ICD10-I70.0). Electronically Signed   By: Delanna Ahmadi M.D.   On: 02/11/2021 18:39    Medications: I have reviewed the patient's current medications.   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), isolated mesenteric implant-resected, multiple tumor deposits Sigmoid/descending resection and creation of a descending colostomy 04/10/2016 MSI-stable, no loss of mismatch repair protein expression Foundation 1- BRAF V600E positive, MS-stable, intermediate tumor mutation burden, no RAS mutation CT abdomen/pelvis 04/02/2016-no evidence of distant metastatic disease Cycle 1 FOLFOX 05/21/2016 Cycle 2 FOLFOX 06/04/2016 Cycle 3 FOLFOX 06/18/2016 Cycle 4 FOLFOX 07/02/2016 (oxaliplatin held secondary to thrombocytopenia) Cycle 5 FOLFOX 07/16/2016 Cycle 6 FOLFOX 07/30/2016 Cycle 7 FOLFOX 08/13/2016 (oxaliplatin held and 5-FU dose reduced) Cycle 8 FOLFOX 09/03/2016 (oxaliplatin held secondary to neuropathy) Cycle 9 FOLFOX 09/17/2016 (oxaliplatin held secondary to neuropathy) Cycle 10 FOLFOX 10/02/2016 Cycle 11 FOLFOX 10/15/2016 (oxaliplatin eliminated from the regimen) Cycle 12 FOLFOX 10/30/2016 (oxaliplatin eliminated) CTs 05/12/2017-no evidence of recurrent disease, right inguinal hernia containing bladder Colonoscopy 07/21/2017, 3 polyps were removed from the descending and transverse colon, fragments of tubular and tubulovillous adenoma CTs 05/19/2018-no evidence of recurrent  disease, status post hernia repair, right upper lobe pneumonia CTs 05/20/2019-resolution of right upper lobe pneumonia, no evidence of metastatic disease Colonoscopy 01/25/2020-multiple polyps removed-tubular adenomas, poor preparation, repeat colonoscopy recommended CTs neck, chest, and abdomen/pelvis 08/27/2020- new 2 centimeter left axillary node, new 1.9 cm left inguinal node chronic mildly prominent portacaval node Ultrasound biopsy of left inguinal node on 10/29/2020-metastatic adenocarcinoma consistent with colon adenocarcinoma PET 0/[id number] hypermetabolic left inguinal and left axillary nodes, solitary focus of hypermetabolic activity in the right lower quadrant small bowel Cycle 1 FOLFOX 12/19/2020 Cycle 2 FOLFOX 01/02/2021, oxaliplatin dose reduced secondary to neutropenia and thrombocytopenia Cycle 3 FOLFOX 01/16/2021 Cycle 4 FOLFOX 01/30/2021 CTs 02/11/2021-no change in left inguinal and left axillary nodes, no evidence of disease progression, stable wall thickening involving a loop of distal ileum   2.   Chronic renal insufficiency   3.   Hypertension   4.  Inflamed sebaceous cyst at the upper back-status post incision and drainage   5.  Thrombocytopenia secondary chemotherapy-oxaliplatin dose reduced beginning with cycle 3 FOLFOX, oxaliplatin held with cycle 4 and cycle 7 FOLFOX   6.  Oxaliplatin neuropathy-improved   7.  History of Mucositis secondary chemotherapy   8.  Symptoms of pneumonia January 2020-CT 05/19/2018 consistent with right upper lobe pneumonia, Levaquin prescribed 05/20/2018   9.  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  He has completed 4 cycles of FOLFOX.  I reviewed the CT images with him.  The left inguinal node appears slightly smaller.  We discussed treatment options.  He is not experiencing a significant response to the FOLFOX.  I recommend  discontinuing FOLFOX.  We discussed observation, a  change to FOLFIRI +/- bevacizumab, and encorafenib/panitumumab.  The tumor has a BRAF mutation.  My initial impression is to recommend a change to FOLFIRI.  We reviewed potential toxicities associated with irinotecan including the chance of alopecia, hematologic toxicity, and acute/delayed diarrhea.  He agrees to proceed.  He has chronic renal failure.  We will consider adding bevacizumab after further discussion with pharmacy and Dr. Reynaldo Minium.  Louis. [name] has a vacation planned for next week.  The plan is to resume systemic therapy on 02/27/2021.  Betsy Coder, MD  02/13/2021  10:36 AM

## 2021-02-13 NOTE — Patient Instructions (Signed)
Implanted Port Home Guide An implanted port is a device that is placed under the skin. It is usually placed in the chest. The device can be used to give IV medicine, to take blood, or for dialysis. You may have an implanted port if: You need IV medicine that would be irritating to the small veins in your hands or arms. You need IV medicines, such as antibiotics, for a long period of time. You need IV nutrition for a long period of time. You need dialysis. When you have a port, your health care provider can choose to use the port instead of veins in your arms for these procedures. You may have fewer limitations when using a port than you would if you used other types of long-term IVs, and you will likely be able to return to normal activities after your incision heals. An implanted port has two main parts: Reservoir. The reservoir is the part where a needle is inserted to give medicines or draw blood. The reservoir is round. After it is placed, it appears as a small, raised area under your skin. Catheter. The catheter is a thin, flexible tube that connects the reservoir to a vein. Medicine that is inserted into the reservoir goes into the catheter and then into the vein. How is my port accessed? To access your port: A numbing cream may be placed on the skin over the port site. Your health care provider will put on a mask and sterile gloves. The skin over your port will be cleaned carefully with a germ-killing soap and allowed to dry. Your health care provider will gently pinch the port and insert a needle into it. Your health care provider will check for a blood return to make sure the port is in the vein and is not clogged. If your port needs to remain accessed to get medicine continuously (constant infusion), your health care provider will place a clear bandage (dressing) over the needle site. The dressing and needle will need to be changed every week, or as told by your health care provider. What  is flushing? Flushing helps keep the port from getting clogged. Follow instructions from your health care provider about how and when to flush the port. Ports are usually flushed with saline solution or a medicine called heparin. The need for flushing will depend on how the port is used: If the port is only used from time to time to give medicines or draw blood, the port may need to be flushed: Before and after medicines have been given. Before and after blood has been drawn. As part of routine maintenance. Flushing may be recommended every 4-6 weeks. If a constant infusion is running, the port may not need to be flushed. Throw away any syringes in a disposal container that is meant for sharp items (sharps container). You can buy a sharps container from a pharmacy, or you can make one by using an empty hard plastic bottle with a cover. How long will my port stay implanted? The port can stay in for as long as your health care provider thinks it is needed. When it is time for the port to come out, a surgery will be done to remove it. The surgery will be similar to the procedure that was done to put the port in. Follow these instructions at home:  Flush your port as told by your health care provider. If you need an infusion over several days, follow instructions from your health care provider about how   to take care of your port site. Make sure you: Wash your hands with soap and water before you change your dressing. If soap and water are not available, use alcohol-based hand sanitizer. Change your dressing as told by your health care provider. Place any used dressings or infusion bags into a plastic bag. Throw that bag in the trash. Keep the dressing that covers the needle clean and dry. Do not get it wet. Do not use scissors or sharp objects near the tube. Keep the tube clamped, unless it is being used. Check your port site every day for signs of infection. Check for: Redness, swelling, or  pain. Fluid or blood. Pus or a bad smell. Protect the skin around the port site. Avoid wearing bra straps that rub or irritate the site. Protect the skin around your port from seat belts. Place a soft pad over your chest if needed. Bathe or shower as told by your health care provider. The site may get wet as long as you are not actively receiving an infusion. Return to your normal activities as told by your health care provider. Ask your health care provider what activities are safe for you. Carry a medical alert card or wear a medical alert bracelet at all times. This will let health care providers know that you have an implanted port in case of an emergency. Get help right away if: You have redness, swelling, or pain at the port site. You have fluid or blood coming from your port site. You have pus or a bad smell coming from the port site. You have a fever. Summary Implanted ports are usually placed in the chest for long-term IV access. Follow instructions from your health care provider about flushing the port and changing bandages (dressings). Take care of the area around your port by avoiding clothing that puts pressure on the area, and by watching for signs of infection. Protect the skin around your port from seat belts. Place a soft pad over your chest if needed. Get help right away if you have a fever or you have redness, swelling, pain, drainage, or a bad smell at the port site. This information is not intended to replace advice given to you by your health care provider. Make sure you discuss any questions you have with your health care provider. Document Revised: 07/11/2020 Document Reviewed: 09/05/2019 [employer] Patient Education  2022 [employer]  

## 2021-02-13 NOTE — Progress Notes (Signed)
DISCONTINUE OFF PATHWAY REGIMEN - Colorectal   [id number]:[id number] (Leucovorin IV D1 + Fluorouracil IV D1/CIV D1,2 + Oxaliplatin IV D1) q14 Days:   A cycle is every 14 days:     Oxaliplatin      Leucovorin      Fluorouracil      Fluorouracil   **Always confirm dose/schedule in your pharmacy ordering system**  REASON: Other Reason PRIOR TREATMENT: Off Pathway: [id number] (Leucovorin IV D1 + Fluorouracil IV D1/CIV D1,2 + Oxaliplatin IV D1) q14 Days TREATMENT RESPONSE: Stable Disease (SD)  START OFF PATHWAY REGIMEN - Colorectal   [id number]:FOLFIRI + Bevacizumab (Leucovorin IV D1 + Fluorouracil IV D1/CIV D1,2 + Irinotecan IV D1 + Bevacizumab IV D1) q14 Days:   A cycle is every 14 days:     Bevacizumab-xxxx      Irinotecan      Leucovorin      Fluorouracil      Fluorouracil   **Always confirm dose/schedule in your pharmacy ordering system**  Patient Characteristics: Distant Metastases, Nonsurgical Candidate, BRAF V600 Mutation Positive (KRAS/NRAS Wild-Type), Standard Cytotoxic/Targeted Therapy, Second Line Standard Cytotoxic/Targeted Therapy Tumor Location: Colon Therapeutic Status: Distant Metastases Microsatellite/Mismatch Repair Status: MSS/pMMR BRAF Mutation Status: Mutation Positive KRAS/NRAS Mutation Status: Wild-Type (no mutation) Standard Cytotoxic/Targeted Line of Therapy: Second Line Standard Cytotoxic/Targeted Therapy Intent of Therapy: Non-Curative / Palliative Intent, Discussed with Patient

## 2021-02-14 ENCOUNTER — Telehealth: Payer: Self-pay

## 2021-02-14 DIAGNOSIS — Z23 Encounter for immunization: Secondary | ICD-10-CM | POA: Diagnosis not present

## 2021-02-14 NOTE — Telephone Encounter (Signed)
-----   Message from Ladell Pier, MD sent at 02/13/2021  2:12 PM EDT ----- Add CBC to labs on 02/27/2021

## 2021-02-14 NOTE — Telephone Encounter (Signed)
Called spoke with pt concerning most recent lab results also pt states he will be getting flu shot and will update [place] as needed no concerns questions voiced call ended

## 2021-02-15 ENCOUNTER — Inpatient Hospital Stay: Payer: Medicare Other

## 2021-02-18 ENCOUNTER — Encounter: Payer: Self-pay | Admitting: *Deleted

## 2021-02-24 ENCOUNTER — Other Ambulatory Visit: Payer: Self-pay | Admitting: Oncology

## 2021-02-27 ENCOUNTER — Inpatient Hospital Stay: Payer: Medicare Other

## 2021-02-27 ENCOUNTER — Other Ambulatory Visit: Payer: Self-pay

## 2021-02-27 ENCOUNTER — Telehealth: Payer: Self-pay

## 2021-02-27 ENCOUNTER — Inpatient Hospital Stay (HOSPITAL_BASED_OUTPATIENT_CLINIC_OR_DEPARTMENT_OTHER): Payer: Medicare Other | Admitting: Oncology

## 2021-02-27 VITALS — BP 120/65 | HR 59

## 2021-02-27 VITALS — BP 132/57 | HR 83 | Temp 98.0°F | Resp 18 | Ht 76.0 in | Wt 250.6 lb

## 2021-02-27 DIAGNOSIS — C189 Malignant neoplasm of colon, unspecified: Secondary | ICD-10-CM

## 2021-02-27 DIAGNOSIS — G620 Drug-induced polyneuropathy: Secondary | ICD-10-CM | POA: Diagnosis not present

## 2021-02-27 DIAGNOSIS — C187 Malignant neoplasm of sigmoid colon: Secondary | ICD-10-CM | POA: Diagnosis not present

## 2021-02-27 DIAGNOSIS — Z5111 Encounter for antineoplastic chemotherapy: Secondary | ICD-10-CM | POA: Diagnosis not present

## 2021-02-27 DIAGNOSIS — Z5112 Encounter for antineoplastic immunotherapy: Secondary | ICD-10-CM | POA: Diagnosis not present

## 2021-02-27 DIAGNOSIS — I129 Hypertensive chronic kidney disease with stage 1 through stage 4 chronic kidney disease, or unspecified chronic kidney disease: Secondary | ICD-10-CM | POA: Diagnosis not present

## 2021-02-27 DIAGNOSIS — N189 Chronic kidney disease, unspecified: Secondary | ICD-10-CM | POA: Diagnosis not present

## 2021-02-27 LAB — CMP (CANCER CENTER ONLY)
ALT: 9 U/L (ref 0–44)
AST: 8 U/L — ABNORMAL LOW (ref 15–41)
Albumin: 3.7 g/dL (ref 3.5–5.0)
Alkaline Phosphatase: 72 U/L (ref 38–126)
Anion gap: 8 (ref 5–15)
BUN: 25 mg/dL — ABNORMAL HIGH (ref 8–23)
CO2: 24 mmol/L (ref 22–32)
Calcium: 8.7 mg/dL — ABNORMAL LOW (ref 8.9–10.3)
Chloride: 106 mmol/L (ref 98–111)
Creatinine: 2.42 mg/dL — ABNORMAL HIGH (ref 0.61–1.24)
GFR, Estimated: 27 mL/min — ABNORMAL LOW (ref >60)
Glucose, Bld: 186 mg/dL — ABNORMAL HIGH (ref 70–99)
Potassium: 4 mmol/L (ref 3.5–5.1)
Sodium: 138 mmol/L (ref 135–145)
Total Bilirubin: 0.4 mg/dL (ref 0.3–1.2)
Total Protein: 6.5 g/dL (ref 6.5–8.1)

## 2021-02-27 LAB — CBC WITH DIFFERENTIAL (CANCER CENTER ONLY)
Abs Immature Granulocytes: 0.01 10*3/uL (ref 0.00–0.07)
Basophils Absolute: 0 10*3/uL (ref 0.0–0.1)
Basophils Relative: 1 %
Eosinophils Absolute: 0.2 10*3/uL (ref 0.0–0.5)
Eosinophils Relative: 5 %
HCT: 28.9 % — ABNORMAL LOW (ref 39.0–52.0)
Hemoglobin: 8.8 g/dL — ABNORMAL LOW (ref 13.0–17.0)
Immature Granulocytes: 0 %
Lymphocytes Relative: 24 %
Lymphs Abs: 1 10*3/uL (ref 0.7–4.0)
MCH: 25.7 pg — ABNORMAL LOW (ref 26.0–34.0)
MCHC: 30.4 g/dL (ref 30.0–36.0)
MCV: 84.3 fL (ref 80.0–100.0)
Monocytes Absolute: 0.5 10*3/uL (ref 0.1–1.0)
Monocytes Relative: 12 %
Neutro Abs: 2.5 10*3/uL (ref 1.7–7.7)
Neutrophils Relative %: 58 %
Platelet Count: 151 10*3/uL (ref 150–400)
RBC: 3.43 MIL/uL — ABNORMAL LOW (ref 4.22–5.81)
RDW: 18.6 % — ABNORMAL HIGH (ref 11.5–15.5)
WBC Count: 4.4 10*3/uL (ref 4.0–10.5)
nRBC: 0 % (ref 0.0–0.2)

## 2021-02-27 LAB — TOTAL PROTEIN, URINE DIPSTICK

## 2021-02-27 MED ORDER — SODIUM CHLORIDE 0.9 % IV SOLN
1800.0000 mg/m2 | INTRAVENOUS | Status: DC
  Administered 2021-02-27: 4450 mg via INTRAVENOUS
  Filled 2021-02-27: qty 89

## 2021-02-27 MED ORDER — FLUOROURACIL CHEMO INJECTION 2.5 GM/50ML
300.0000 mg/m2 | Freq: Once | INTRAVENOUS | Status: AC
  Administered 2021-02-27: 750 mg via INTRAVENOUS
  Filled 2021-02-27: qty 15

## 2021-02-27 MED ORDER — SODIUM CHLORIDE 0.9 % IV SOLN
145.0000 mg/m2 | Freq: Once | INTRAVENOUS | Status: AC
  Administered 2021-02-27: 360 mg via INTRAVENOUS
  Filled 2021-02-27: qty 15

## 2021-02-27 MED ORDER — SODIUM CHLORIDE 0.9 % IV SOLN
5.0000 mg/kg | Freq: Once | INTRAVENOUS | Status: AC
  Administered 2021-02-27: 600 mg via INTRAVENOUS
  Filled 2021-02-27: qty 16

## 2021-02-27 MED ORDER — SODIUM CHLORIDE 0.9 % IV SOLN
300.0000 mg/m2 | Freq: Once | INTRAVENOUS | Status: AC
  Administered 2021-02-27: 738 mg via INTRAVENOUS
  Filled 2021-02-27: qty 17.5

## 2021-02-27 MED ORDER — SODIUM CHLORIDE 0.9 % IV SOLN: Freq: Once | INTRAVENOUS | Status: AC

## 2021-02-27 MED ORDER — SODIUM CHLORIDE 0.9 % IV SOLN
10.0000 mg | Freq: Once | INTRAVENOUS | Status: AC
  Administered 2021-02-27: 10 mg via INTRAVENOUS
  Filled 2021-02-27: qty 10

## 2021-02-27 MED ORDER — PALONOSETRON HCL INJECTION 0.25 MG/5ML
0.2500 mg | Freq: Once | INTRAVENOUS | Status: AC
  Administered 2021-02-27: 0.25 mg via INTRAVENOUS
  Filled 2021-02-27: qty 5

## 2021-02-27 MED ORDER — ATROPINE SULFATE 1 MG/ML IV SOLN
0.5000 mg | Freq: Once | INTRAVENOUS | Status: AC | PRN
  Administered 2021-02-27: 0.5 mg via INTRAVENOUS
  Filled 2021-02-27: qty 1

## 2021-02-27 NOTE — Progress Notes (Signed)
[place] OFFICE PROGRESS NOTE   Diagnosis: Colon cancer  INTERVAL HISTORY:   [name] returns as scheduled.  No new complaint.  He feels the left inguinal node is larger.  Objective:  Vital signs in last 24 hours:  Blood pressure (!) 132/57, pulse 83, temperature 98 F (36.7 C), temperature source Oral, resp. rate 18, height '6\' 4"'  (1.93 m), weight 250 lb 9.6 oz (113.7 kg), SpO2 99 %.    HEENT: No thrush or ulcers Lymphatics: 2-3 cm mobile left axillary node, 5-6 cm left inguinal node, prominence of the right axillary fat pad versus a medial 2-3 cm node Resp: Lungs clear bilaterally Cardio: Regular rate and rhythm GI: No hepatosplenomegaly Vascular: No leg edema  Portacath/PICC-without erythema  Lab Results:  Lab Results  Component Value Date   WBC 4.4 02/27/2021   HGB 8.8 (L) 02/27/2021   HCT 28.9 (L) 02/27/2021   MCV 84.3 02/27/2021   PLT 151 02/27/2021   NEUTROABS 2.5 02/27/2021    CMP  Lab Results  Component Value Date   NA 138 02/27/2021   K 4.0 02/27/2021   CL 106 02/27/2021   CO2 24 02/27/2021   GLUCOSE 186 (H) 02/27/2021   BUN 25 (H) 02/27/2021   CREATININE 2.42 (H) 02/27/2021   CALCIUM 8.7 (L) 02/27/2021   PROT 6.5 02/27/2021   ALBUMIN 3.7 02/27/2021   AST 8 (L) 02/27/2021   ALT 9 02/27/2021   ALKPHOS 72 02/27/2021   BILITOT 0.4 02/27/2021   GFRNONAA 27 (L) 02/27/2021   GFRAA 28 (L) 09/20/2019    Lab Results  Component Value Date   CEA1 2.61 01/02/2021   CEA 3.76 01/02/2021    Lab Results  Component Value Date   INR 0.9 10/11/2020   LABPROT 12.6 10/11/2020    Imaging:  No results found.  Medications: I have reviewed the patient's current medications.   Assessment/Plan: Sigmoid colon cancer, stage IV (B2E,F0O,F1Q), isolated mesenteric implant-resected, multiple tumor deposits Sigmoid/descending resection and creation of a descending colostomy 04/10/2016 MSI-stable, no loss of mismatch repair protein  expression Foundation 1- BRAF V600E positive, MS-stable, intermediate tumor mutation burden, no RAS mutation CT abdomen/pelvis 04/02/2016-no evidence of distant metastatic disease Cycle 1 FOLFOX 05/21/2016 Cycle 2 FOLFOX 06/04/2016 Cycle 3 FOLFOX 06/18/2016 Cycle 4 FOLFOX 07/02/2016 (oxaliplatin held secondary to thrombocytopenia) Cycle 5 FOLFOX 07/16/2016 Cycle 6 FOLFOX 07/30/2016 Cycle 7 FOLFOX 08/13/2016 (oxaliplatin held and 5-FU dose reduced) Cycle 8 FOLFOX 09/03/2016 (oxaliplatin held secondary to neuropathy) Cycle 9 FOLFOX 09/17/2016 (oxaliplatin held secondary to neuropathy) Cycle 10 FOLFOX 10/02/2016 Cycle 11 FOLFOX 10/15/2016 (oxaliplatin eliminated from the regimen) Cycle 12 FOLFOX 10/30/2016 (oxaliplatin eliminated) CTs 05/12/2017-no evidence of recurrent disease, right inguinal hernia containing bladder Colonoscopy 07/21/2017, 3 polyps were removed from the descending and transverse colon, fragments of tubular and tubulovillous adenoma CTs 05/19/2018-no evidence of recurrent disease, status post hernia repair, right upper lobe pneumonia CTs 05/20/2019-resolution of right upper lobe pneumonia, no evidence of metastatic disease Colonoscopy 01/25/2020-multiple polyps removed-tubular adenomas, poor preparation, repeat colonoscopy recommended CTs neck, chest, and abdomen/pelvis 08/27/2020- new 2 centimeter left axillary node, new 1.9 cm left inguinal node chronic mildly prominent portacaval node Ultrasound biopsy of left inguinal node on 10/29/2020-metastatic adenocarcinoma consistent with colon adenocarcinoma PET 1/[id number] hypermetabolic left inguinal and left axillary nodes, solitary focus of hypermetabolic activity in the right lower quadrant small bowel Cycle 1 FOLFOX 12/19/2020 Cycle 2 FOLFOX 01/02/2021, oxaliplatin dose reduced secondary to neutropenia and thrombocytopenia Cycle 3 FOLFOX 01/16/2021 Cycle 4 FOLFOX 01/30/2021  CTs 02/11/2021-no change in left inguinal and left  axillary nodes, no evidence of disease progression, stable wall thickening involving a loop of distal ileum Cycle 1 FOLFIRI/Avastin 02/27/2021   2.   Chronic renal insufficiency   3.   Hypertension   4.  Inflamed sebaceous cyst at the upper back-status post incision and drainage   5.  Thrombocytopenia secondary chemotherapy-oxaliplatin dose reduced beginning with cycle 3 FOLFOX, oxaliplatin held with cycle 4 and cycle 7 FOLFOX   6.  Oxaliplatin neuropathy-improved   7.  History of Mucositis secondary chemotherapy   8.  Symptoms of pneumonia January 2020-CT 05/19/2018 consistent with right upper lobe pneumonia, Levaquin prescribed 05/20/2018   9.  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  The plan is to proceed with FOLFIRI/Avastin today.  We reviewed potential toxicities associated with this regimen again today.  I discussed treatment options and the FOLFIRI/Avastin regimen with him by telephone 2 weeks ago.  He understands the chance of nausea/vomiting, alopecia, mucositis, diarrhea, and hematologic toxicity.  We discussed the potential for thromboembolic disease, CNS toxicity, nephrotoxicity, hypertension, and bleeding with bevacizumab.  He will return for an office visit and chemotherapy in 2 weeks.  Betsy Coder, MD  02/27/2021  10:01 AM

## 2021-02-27 NOTE — Telephone Encounter (Signed)
Patient seen by Dr. Sherrill today ? ?Vitals are within treatment parameters. ? ?Labs reviewed by Dr. Sherrill and are within treatment parameters. ? ?Per physician team, patient is ready for treatment and there are NO modifications to the treatment plan.  ?

## 2021-02-27 NOTE — Patient Instructions (Signed)
[facility]  Discharge Instructions: Thank you for choosing [facility] to provide your oncology and hematology care.   If you have a lab appointment with the [facility], please go directly to the [facility] and check in at the registration area.   Wear comfortable clothing and clothing appropriate for easy access to any Portacath or PICC line.   We strive to give you quality time with your provider. You may need to reschedule your appointment if you arrive late (15 or more minutes).  Arriving late affects you and other patients whose appointments are after yours.  Also, if you miss three or more appointments without notifying the office, you may be dismissed from the clinic at the provider's discretion.      For prescription refill requests, have your pharmacy contact our office and allow 72 hours for refills to be completed.    Today you received the following chemotherapy and/or immunotherapy agents: bevacizumab, irinotecan, leucovorin, fluorouracil.       To help prevent nausea and vomiting after your treatment, we encourage you to take your nausea medication as directed.  BELOW ARE SYMPTOMS THAT SHOULD BE REPORTED IMMEDIATELY: *FEVER GREATER THAN 100.4 F (38 C) OR HIGHER *CHILLS OR SWEATING *NAUSEA AND VOMITING THAT IS NOT CONTROLLED WITH YOUR NAUSEA MEDICATION *UNUSUAL SHORTNESS OF BREATH *UNUSUAL BRUISING OR BLEEDING *URINARY PROBLEMS (pain or burning when urinating, or frequent urination) *BOWEL PROBLEMS (unusual diarrhea, constipation, pain near the anus) TENDERNESS IN MOUTH AND THROAT WITH OR WITHOUT PRESENCE OF ULCERS (sore throat, sores in mouth, or a toothache) UNUSUAL RASH, SWELLING OR PAIN  UNUSUAL VAGINAL DISCHARGE OR ITCHING   Items with * indicate a potential emergency and should be followed up as soon as possible or go to the Emergency Department if any problems should occur.  Please show the CHEMOTHERAPY ALERT CARD or  IMMUNOTHERAPY ALERT CARD at check-in to the Emergency Department and triage nurse.  Should you have questions after your visit or need to cancel or reschedule your appointment, please contact [facility]  Dept: [phone]  and follow the prompts.  Office hours are 8:00 a.m. to 4:30 p.m. Monday - Friday. Please note that voicemails left after 4:00 p.m. may not be returned until the following business day.  We are closed weekends and major holidays. You have access to a nurse at all times for urgent questions. Please call the main number to the clinic Dept: [phone] and follow the prompts.   For any non-urgent questions, you may also contact your provider using MyChart. We now offer e-Visits for anyone 58 and older to request care online for non-urgent symptoms. For details visit [email or web address].   Also download the MyChart app! Go to the app store, search "MyChart", open the app, select [facility], and log in with your MyChart username and password.  Due to Covid, a mask is required upon entering the hospital/clinic. If you do not have a mask, one will be given to you upon arrival. For doctor visits, patients may have 1 support person aged 31 or older with them. For treatment visits, patients cannot have anyone with them due to current Covid guidelines and our immunocompromised population.   Bevacizumab injection What is this medication? BEVACIZUMAB (be va SIZ yoo mab) is a monoclonal antibody. It is used to treat many types of cancer. This medicine may be used for other purposes; ask your health care provider or pharmacist if you have questions. COMMON  BRAND NAME(S): Avastin, MVASI, Noah Charon What should I tell my care team before I take this medication? They need to know if you have any of these conditions: diabetes heart disease high blood pressure history of coughing up blood prior anthracycline chemotherapy (e.g., doxorubicin, daunorubicin,  epirubicin) recent or ongoing radiation therapy recent or planning to have surgery stroke an unusual or allergic reaction to bevacizumab, hamster proteins, mouse proteins, other medicines, foods, dyes, or preservatives pregnant or trying to get pregnant breast-feeding How should I use this medication? This medicine is for infusion into a vein. It is given by a health care professional in a hospital or clinic setting. Talk to your pediatrician regarding the use of this medicine in children. Special care may be needed. Overdosage: If you think you have taken too much of this medicine contact a poison control center or emergency room at once. NOTE: This medicine is only for you. Do not share this medicine with others. What if I miss a dose? It is important not to miss your dose. Call your doctor or health care professional if you are unable to keep an appointment. What may interact with this medication? Interactions are not expected. This list may not describe all possible interactions. Give your health care provider a list of all the medicines, herbs, non-prescription drugs, or dietary supplements you use. Also tell them if you smoke, drink alcohol, or use illegal drugs. Some items may interact with your medicine. What should I watch for while using this medication? Your condition will be monitored carefully while you are receiving this medicine. You will need important blood work and urine testing done while you are taking this medicine. This medicine may increase your risk to bruise or bleed. Call your doctor or health care professional if you notice any unusual bleeding. Before having surgery, talk to your health care provider to make sure it is ok. This drug can increase the risk of poor healing of your surgical site or wound. You will need to stop this drug for 28 days before surgery. After surgery, wait at least 28 days before restarting this drug. Make sure the surgical site or wound is  healed enough before restarting this drug. Talk to your health care provider if questions. Do not become pregnant while taking this medicine or for 6 months after stopping it. Women should inform their doctor if they wish to become pregnant or think they might be pregnant. There is a potential for serious side effects to an unborn child. Talk to your health care professional or pharmacist for more information. Do not breast-feed an infant while taking this medicine and for 6 months after the last dose. This medicine has caused ovarian failure in some women. This medicine may interfere with the ability to have a child. You should talk to your doctor or health care professional if you are concerned about your fertility. What side effects may I notice from receiving this medication? Side effects that you should report to your doctor or health care professional as soon as possible: allergic reactions like skin rash, itching or hives, swelling of the face, lips, or tongue chest pain or chest tightness chills coughing up blood high fever seizures severe constipation signs and symptoms of bleeding such as bloody or black, tarry stools; red or dark-brown urine; spitting up blood or brown material that looks like coffee grounds; red spots on the skin; unusual bruising or bleeding from the eye, gums, or nose signs and symptoms of a blood clot such  as breathing problems; chest pain; severe, sudden headache; pain, swelling, warmth in the leg signs and symptoms of a stroke like changes in vision; confusion; trouble speaking or understanding; severe headaches; sudden numbness or weakness of the face, arm or leg; trouble walking; dizziness; loss of balance or coordination stomach pain sweating swelling of legs or ankles vomiting weight gain Side effects that usually do not require medical attention (report to your doctor or health care professional if they continue or are bothersome): back pain changes in  taste decreased appetite dry skin nausea tiredness This list may not describe all possible side effects. Call your doctor for medical advice about side effects. You may report side effects to FDA at 1-800-FDA-1088. Where should I keep my medication? This drug is given in a hospital or clinic and will not be stored at home. NOTE: This sheet is a summary. It may not cover all possible information. If you have questions about this medicine, talk to your doctor, pharmacist, or health care provider.  2022 Elsevier/Gold Standard (2019-02-16 10:50:46)  Irinotecan injection What is this medication? IRINOTECAN (ir in oh TEE kan ) is a chemotherapy drug. It is used to treat colon and rectal cancer. This medicine may be used for other purposes; ask your health care provider or pharmacist if you have questions. COMMON BRAND NAME(S): Camptosar What should I tell my care team before I take this medication? They need to know if you have any of these conditions: dehydration diarrhea infection (especially a virus infection such as chickenpox, cold sores, or herpes) liver disease low blood counts, like low white cell, platelet, or red cell counts low levels of calcium, magnesium, or potassium in the blood recent or ongoing radiation therapy an unusual or allergic reaction to irinotecan, other medicines, foods, dyes, or preservatives pregnant or trying to get pregnant breast-feeding How should I use this medication? This drug is given as an infusion into a vein. It is administered in a hospital or clinic by a specially trained health care professional. Talk to your pediatrician regarding the use of this medicine in children. Special care may be needed. Overdosage: If you think you have taken too much of this medicine contact a poison control center or emergency room at once. NOTE: This medicine is only for you. Do not share this medicine with others. What if I miss a dose? It is important not to miss  your dose. Call your doctor or health care professional if you are unable to keep an appointment. What may interact with this medication? Do not take this medicine with any of the following medications: cobicistat itraconazole This medicine may interact with the following medications: antiviral medicines for HIV or AIDS certain antibiotics like rifampin or rifabutin certain medicines for fungal infections like ketoconazole, posaconazole, and voriconazole certain medicines for seizures like carbamazepine, phenobarbital, phenotoin clarithromycin gemfibrozil nefazodone [facility]. John's Wort This list may not describe all possible interactions. Give your health care provider a list of all the medicines, herbs, non-prescription drugs, or dietary supplements you use. Also tell them if you smoke, drink alcohol, or use illegal drugs. Some items may interact with your medicine. What should I watch for while using this medication? Your condition will be monitored carefully while you are receiving this medicine. You will need important blood work done while you are taking this medicine. This drug may make you feel generally unwell. This is not uncommon, as chemotherapy can affect healthy cells as well as cancer cells. Report any side effects. Continue  your course of treatment even though you feel ill unless your doctor tells you to stop. In some cases, you may be given additional medicines to help with side effects. Follow all directions for their use. You may get drowsy or dizzy. Do not drive, use machinery, or do anything that needs mental alertness until you know how this medicine affects you. Do not stand or sit up quickly, especially if you are an older patient. This reduces the risk of dizzy or fainting spells. Call your health care professional for advice if you get a fever, chills, or sore throat, or other symptoms of a cold or flu. Do not treat yourself. This medicine decreases your body's ability to  fight infections. Try to avoid being around people who are sick. Avoid taking products that contain aspirin, acetaminophen, ibuprofen, naproxen, or ketoprofen unless instructed by your doctor. These medicines may hide a fever. This medicine may increase your risk to bruise or bleed. Call your doctor or health care professional if you notice any unusual bleeding. Be careful brushing and flossing your teeth or using a toothpick because you may get an infection or bleed more easily. If you have any dental work done, tell your dentist you are receiving this medicine. Do not become pregnant while taking this medicine or for 6 months after stopping it. Women should inform their health care professional if they wish to become pregnant or think they might be pregnant. Men should not father a child while taking this medicine and for 3 months after stopping it. There is potential for serious side effects to an unborn child. Talk to your health care professional for more information. Do not breast-feed an infant while taking this medicine or for 7 days after stopping it. This medicine has caused ovarian failure in some women. This medicine may make it more difficult to get pregnant. Talk to your health care professional if you are concerned about your fertility. This medicine has caused decreased sperm counts in some men. This may make it more difficult to father a child. Talk to your health care professional if you are concerned about your fertility. What side effects may I notice from receiving this medication? Side effects that you should report to your doctor or health care professional as soon as possible: allergic reactions like skin rash, itching or hives, swelling of the face, lips, or tongue chest pain diarrhea flushing, runny nose, sweating during infusion low blood counts - this medicine may decrease the number of white blood cells, red blood cells and platelets. You may be at increased risk for  infections and bleeding. nausea, vomiting pain, swelling, warmth in the leg signs of decreased platelets or bleeding - bruising, pinpoint red spots on the skin, black, tarry stools, blood in the urine signs of infection - fever or chills, cough, sore throat, pain or difficulty passing urine signs of decreased red blood cells - unusually weak or tired, fainting spells, lightheadedness Side effects that usually do not require medical attention (report to your doctor or health care professional if they continue or are bothersome): constipation hair loss headache loss of appetite mouth sores stomach pain This list may not describe all possible side effects. Call your doctor for medical advice about side effects. You may report side effects to FDA at 1-800-FDA-1088. Where should I keep my medication? This drug is given in a hospital or clinic and will not be stored at home. NOTE: This sheet is a summary. It may not cover all possible information.  If you have questions about this medicine, talk to your doctor, pharmacist, or health care provider.  2022 Elsevier/Gold Standard (2019-03-22 17:46:13)  Leucovorin injection What is this medication? LEUCOVORIN (loo koe VOR in) is used to prevent or treat the harmful effects of some medicines. This medicine is used to treat anemia caused by a low amount of folic acid in the body. It is also used with 5-fluorouracil (5-FU) to treat colon cancer. This medicine may be used for other purposes; ask your health care provider or pharmacist if you have questions. What should I tell my care team before I take this medication? They need to know if you have any of these conditions: anemia from low levels of vitamin B-12 in the blood an unusual or allergic reaction to leucovorin, folic acid, other medicines, foods, dyes, or preservatives pregnant or trying to get pregnant breast-feeding How should I use this medication? This medicine is for injection into a  muscle or into a vein. It is given by a health care professional in a hospital or clinic setting. Talk to your pediatrician regarding the use of this medicine in children. Special care may be needed. Overdosage: If you think you have taken too much of this medicine contact a poison control center or emergency room at once. NOTE: This medicine is only for you. Do not share this medicine with others. What if I miss a dose? This does not apply. What may interact with this medication? capecitabine fluorouracil phenobarbital phenytoin primidone trimethoprim-sulfamethoxazole This list may not describe all possible interactions. Give your health care provider a list of all the medicines, herbs, non-prescription drugs, or dietary supplements you use. Also tell them if you smoke, drink alcohol, or use illegal drugs. Some items may interact with your medicine. What should I watch for while using this medication? Your condition will be monitored carefully while you are receiving this medicine. This medicine may increase the side effects of 5-fluorouracil, 5-FU. Tell your doctor or health care professional if you have diarrhea or mouth sores that do not get better or that get worse. What side effects may I notice from receiving this medication? Side effects that you should report to your doctor or health care professional as soon as possible: allergic reactions like skin rash, itching or hives, swelling of the face, lips, or tongue breathing problems fever, infection mouth sores unusual bleeding or bruising unusually weak or tired Side effects that usually do not require medical attention (report to your doctor or health care professional if they continue or are bothersome): constipation or diarrhea loss of appetite nausea, vomiting This list may not describe all possible side effects. Call your doctor for medical advice about side effects. You may report side effects to FDA at  1-800-FDA-1088. Where should I keep my medication? This drug is given in a hospital or clinic and will not be stored at home. NOTE: This sheet is a summary. It may not cover all possible information. If you have questions about this medicine, talk to your doctor, pharmacist, or health care provider.  2022 Elsevier/Gold Standard (2007-10-26 16:50:29)  Fluorouracil, 5-FU injection What is this medication? FLUOROURACIL, 5-FU (flure oh YOOR a sil) is a chemotherapy drug. It slows the growth of cancer cells. This medicine is used to treat many types of cancer like breast cancer, colon or rectal cancer, pancreatic cancer, and stomach cancer. This medicine may be used for other purposes; ask your health care provider or pharmacist if you have questions. COMMON BRAND NAME(S): Adrucil  What should I tell my care team before I take this medication? They need to know if you have any of these conditions: blood disorders dihydropyrimidine dehydrogenase (DPD) deficiency infection (especially a virus infection such as chickenpox, cold sores, or herpes) kidney disease liver disease malnourished, poor nutrition recent or ongoing radiation therapy an unusual or allergic reaction to fluorouracil, other chemotherapy, other medicines, foods, dyes, or preservatives pregnant or trying to get pregnant breast-feeding How should I use this medication? This drug is given as an infusion or injection into a vein. It is administered in a hospital or clinic by a specially trained health care professional. Talk to your pediatrician regarding the use of this medicine in children. Special care may be needed. Overdosage: If you think you have taken too much of this medicine contact a poison control center or emergency room at once. NOTE: This medicine is only for you. Do not share this medicine with others. What if I miss a dose? It is important not to miss your dose. Call your doctor or health care professional if you  are unable to keep an appointment. What may interact with this medication? Do not take this medicine with any of the following medications: live virus vaccines This medicine may also interact with the following medications: medicines that treat or prevent blood clots like warfarin, enoxaparin, and dalteparin This list may not describe all possible interactions. Give your health care provider a list of all the medicines, herbs, non-prescription drugs, or dietary supplements you use. Also tell them if you smoke, drink alcohol, or use illegal drugs. Some items may interact with your medicine. What should I watch for while using this medication? Visit your doctor for checks on your progress. This drug may make you feel generally unwell. This is not uncommon, as chemotherapy can affect healthy cells as well as cancer cells. Report any side effects. Continue your course of treatment even though you feel ill unless your doctor tells you to stop. In some cases, you may be given additional medicines to help with side effects. Follow all directions for their use. Call your doctor or health care professional for advice if you get a fever, chills or sore throat, or other symptoms of a cold or flu. Do not treat yourself. This drug decreases your body's ability to fight infections. Try to avoid being around people who are sick. This medicine may increase your risk to bruise or bleed. Call your doctor or health care professional if you notice any unusual bleeding. Be careful brushing and flossing your teeth or using a toothpick because you may get an infection or bleed more easily. If you have any dental work done, tell your dentist you are receiving this medicine. Avoid taking products that contain aspirin, acetaminophen, ibuprofen, naproxen, or ketoprofen unless instructed by your doctor. These medicines may hide a fever. Do not become pregnant while taking this medicine. Women should inform their doctor if they  wish to become pregnant or think they might be pregnant. There is a potential for serious side effects to an unborn child. Talk to your health care professional or pharmacist for more information. Do not breast-feed an infant while taking this medicine. Men should inform their doctor if they wish to father a child. This medicine may lower sperm counts. Do not treat diarrhea with over the counter products. Contact your doctor if you have diarrhea that lasts more than 2 days or if it is severe and watery. This medicine can make you more sensitive  to the sun. Keep out of the sun. If you cannot avoid being in the sun, wear protective clothing and use sunscreen. Do not use sun lamps or tanning beds/booths. What side effects may I notice from receiving this medication? Side effects that you should report to your doctor or health care professional as soon as possible: allergic reactions like skin rash, itching or hives, swelling of the face, lips, or tongue low blood counts - this medicine may decrease the number of white blood cells, red blood cells and platelets. You may be at increased risk for infections and bleeding. signs of infection - fever or chills, cough, sore throat, pain or difficulty passing urine signs of decreased platelets or bleeding - bruising, pinpoint red spots on the skin, black, tarry stools, blood in the urine signs of decreased red blood cells - unusually weak or tired, fainting spells, lightheadedness breathing problems changes in vision chest pain mouth sores nausea and vomiting pain, swelling, redness at site where injected pain, tingling, numbness in the hands or feet redness, swelling, or sores on hands or feet stomach pain unusual bleeding Side effects that usually do not require medical attention (report to your doctor or health care professional if they continue or are bothersome): changes in finger or toe nails diarrhea dry or itchy skin hair loss headache loss  of appetite sensitivity of eyes to the light stomach upset unusually teary eyes This list may not describe all possible side effects. Call your doctor for medical advice about side effects. You may report side effects to FDA at 1-800-FDA-1088. Where should I keep my medication? This drug is given in a hospital or clinic and will not be stored at home. NOTE: This sheet is a summary. It may not cover all possible information. If you have questions about this medicine, talk to your doctor, pharmacist, or health care provider.  2022 Elsevier/Gold Standard (2019-03-22 15:00:03)

## 2021-02-27 NOTE — Progress Notes (Signed)
Patient presents for treatment. RN assessment completed along with the following:  Labs/vitals reviewed - Yes, and per Dr. Benay Spice, ok to treat with Scr 2.42.     Weight within 10% of previous measurement - Yes Oncology Treatment Attestation completed for current therapy- Yes, on date 02/13/21 Informed consent completed and reflects current therapy/intent - Yes, on date 02/27/21             Provider progress note reviewed - Yes, today's provider note was reviewed. Treatment/Antibody/Supportive plan reviewed - Yes, and there are no adjustments needed for today's treatment. S&H and other orders reviewed - Yes, and there are no additional orders identified. Previous treatment date reviewed - Yes, and the appropriate amount of time has elapsed between treatments.  Patient to proceed with treatment.

## 2021-02-27 NOTE — Patient Instructions (Signed)
Implanted Port Home Guide An implanted port is a device that is placed under the skin. It is usually placed in the chest. The device can be used to give IV medicine, to take blood, or for dialysis. You may have an implanted port if: You need IV medicine that would be irritating to the small veins in your hands or arms. You need IV medicines, such as antibiotics, for a long period of time. You need IV nutrition for a long period of time. You need dialysis. When you have a port, your health care provider can choose to use the port instead of veins in your arms for these procedures. You may have fewer limitations when using a port than you would if you used other types of long-term IVs, and you will likely be able to return to normal activities after your incision heals. An implanted port has two main parts: Reservoir. The reservoir is the part where a needle is inserted to give medicines or draw blood. The reservoir is round. After it is placed, it appears as a small, raised area under your skin. Catheter. The catheter is a thin, flexible tube that connects the reservoir to a vein. Medicine that is inserted into the reservoir goes into the catheter and then into the vein. How is my port accessed? To access your port: A numbing cream may be placed on the skin over the port site. Your health care provider will put on a mask and sterile gloves. The skin over your port will be cleaned carefully with a germ-killing soap and allowed to dry. Your health care provider will gently pinch the port and insert a needle into it. Your health care provider will check for a blood return to make sure the port is in the vein and is not clogged. If your port needs to remain accessed to get medicine continuously (constant infusion), your health care provider will place a clear bandage (dressing) over the needle site. The dressing and needle will need to be changed every week, or as told by your health care provider. What  is flushing? Flushing helps keep the port from getting clogged. Follow instructions from your health care provider about how and when to flush the port. Ports are usually flushed with saline solution or a medicine called heparin. The need for flushing will depend on how the port is used: If the port is only used from time to time to give medicines or draw blood, the port may need to be flushed: Before and after medicines have been given. Before and after blood has been drawn. As part of routine maintenance. Flushing may be recommended every 4-6 weeks. If a constant infusion is running, the port may not need to be flushed. Throw away any syringes in a disposal container that is meant for sharp items (sharps container). You can buy a sharps container from a pharmacy, or you can make one by using an empty hard plastic bottle with a cover. How long will my port stay implanted? The port can stay in for as long as your health care provider thinks it is needed. When it is time for the port to come out, a surgery will be done to remove it. The surgery will be similar to the procedure that was done to put the port in. Follow these instructions at home:  Flush your port as told by your health care provider. If you need an infusion over several days, follow instructions from your health care provider about how   to take care of your port site. Make sure you: Wash your hands with soap and water before you change your dressing. If soap and water are not available, use alcohol-based hand sanitizer. Change your dressing as told by your health care provider. Place any used dressings or infusion bags into a plastic bag. Throw that bag in the trash. Keep the dressing that covers the needle clean and dry. Do not get it wet. Do not use scissors or sharp objects near the tube. Keep the tube clamped, unless it is being used. Check your port site every day for signs of infection. Check for: Redness, swelling, or  pain. Fluid or blood. Pus or a bad smell. Protect the skin around the port site. Avoid wearing bra straps that rub or irritate the site. Protect the skin around your port from seat belts. Place a soft pad over your chest if needed. Bathe or shower as told by your health care provider. The site may get wet as long as you are not actively receiving an infusion. Return to your normal activities as told by your health care provider. Ask your health care provider what activities are safe for you. Carry a medical alert card or wear a medical alert bracelet at all times. This will let health care providers know that you have an implanted port in case of an emergency. Get help right away if: You have redness, swelling, or pain at the port site. You have fluid or blood coming from your port site. You have pus or a bad smell coming from the port site. You have a fever. Summary Implanted ports are usually placed in the chest for long-term IV access. Follow instructions from your health care provider about flushing the port and changing bandages (dressings). Take care of the area around your port by avoiding clothing that puts pressure on the area, and by watching for signs of infection. Protect the skin around your port from seat belts. Place a soft pad over your chest if needed. Get help right away if you have a fever or you have redness, swelling, pain, drainage, or a bad smell at the port site. This information is not intended to replace advice given to you by your health care provider. Make sure you discuss any questions you have with your health care provider. Document Revised: 07/11/2020 Document Reviewed: 09/05/2019 [employer] Patient Education  2022 [employer]  

## 2021-02-28 ENCOUNTER — Telehealth: Payer: Self-pay

## 2021-02-28 NOTE — Telephone Encounter (Signed)
Called and spoke to patient for first time chemo follow-up. Patient stated he has been feeling fine and had not questions or concerns.  Patient reminded of pump stop appointment scheduled for 03/01/21 and was instructed to call office with any questions or concerns. Patient verbalized understanding.

## 2021-03-01 ENCOUNTER — Other Ambulatory Visit: Payer: Self-pay

## 2021-03-01 ENCOUNTER — Inpatient Hospital Stay: Payer: Medicare Other

## 2021-03-01 VITALS — BP 138/64 | HR 70 | Temp 97.6°F | Resp 18

## 2021-03-01 DIAGNOSIS — Z5111 Encounter for antineoplastic chemotherapy: Secondary | ICD-10-CM | POA: Diagnosis not present

## 2021-03-01 DIAGNOSIS — Z5112 Encounter for antineoplastic immunotherapy: Secondary | ICD-10-CM | POA: Diagnosis not present

## 2021-03-01 DIAGNOSIS — C187 Malignant neoplasm of sigmoid colon: Secondary | ICD-10-CM | POA: Diagnosis not present

## 2021-03-01 DIAGNOSIS — I129 Hypertensive chronic kidney disease with stage 1 through stage 4 chronic kidney disease, or unspecified chronic kidney disease: Secondary | ICD-10-CM | POA: Diagnosis not present

## 2021-03-01 DIAGNOSIS — N189 Chronic kidney disease, unspecified: Secondary | ICD-10-CM | POA: Diagnosis not present

## 2021-03-01 DIAGNOSIS — C189 Malignant neoplasm of colon, unspecified: Secondary | ICD-10-CM

## 2021-03-01 DIAGNOSIS — G620 Drug-induced polyneuropathy: Secondary | ICD-10-CM | POA: Diagnosis not present

## 2021-03-01 MED ORDER — SODIUM CHLORIDE 0.9% FLUSH
10.0000 mL | INTRAVENOUS | Status: DC | PRN
  Administered 2021-03-01: 10 mL

## 2021-03-01 MED ORDER — HEPARIN SOD (PORK) LOCK FLUSH 100 UNIT/ML IV SOLN
500.0000 [IU] | Freq: Once | INTRAVENOUS | Status: AC | PRN
  Administered 2021-03-01: 500 [IU]

## 2021-03-01 NOTE — Patient Instructions (Signed)
Implanted Port Home Guide An implanted port is a device that is placed under the skin. It is usually placed in the chest. The device can be used to give IV medicine, to take blood, or for dialysis. You may have an implanted port if: You need IV medicine that would be irritating to the small veins in your hands or arms. You need IV medicines, such as antibiotics, for a long period of time. You need IV nutrition for a long period of time. You need dialysis. When you have a port, your health care provider can choose to use the port instead of veins in your arms for these procedures. You may have fewer limitations when using a port than you would if you used other types of long-term IVs, and you will likely be able to return to normal activities after your incision heals. An implanted port has two main parts: Reservoir. The reservoir is the part where a needle is inserted to give medicines or draw blood. The reservoir is round. After it is placed, it appears as a small, raised area under your skin. Catheter. The catheter is a thin, flexible tube that connects the reservoir to a vein. Medicine that is inserted into the reservoir goes into the catheter and then into the vein. How is my port accessed? To access your port: A numbing cream may be placed on the skin over the port site. Your health care provider will put on a mask and sterile gloves. The skin over your port will be cleaned carefully with a germ-killing soap and allowed to dry. Your health care provider will gently pinch the port and insert a needle into it. Your health care provider will check for a blood return to make sure the port is in the vein and is not clogged. If your port needs to remain accessed to get medicine continuously (constant infusion), your health care provider will place a clear bandage (dressing) over the needle site. The dressing and needle will need to be changed every week, or as told by your health care provider. What  is flushing? Flushing helps keep the port from getting clogged. Follow instructions from your health care provider about how and when to flush the port. Ports are usually flushed with saline solution or a medicine called heparin. The need for flushing will depend on how the port is used: If the port is only used from time to time to give medicines or draw blood, the port may need to be flushed: Before and after medicines have been given. Before and after blood has been drawn. As part of routine maintenance. Flushing may be recommended every 4-6 weeks. If a constant infusion is running, the port may not need to be flushed. Throw away any syringes in a disposal container that is meant for sharp items (sharps container). You can buy a sharps container from a pharmacy, or you can make one by using an empty hard plastic bottle with a cover. How long will my port stay implanted? The port can stay in for as long as your health care provider thinks it is needed. When it is time for the port to come out, a surgery will be done to remove it. The surgery will be similar to the procedure that was done to put the port in. Follow these instructions at home:  Flush your port as told by your health care provider. If you need an infusion over several days, follow instructions from your health care provider about how   to take care of your port site. Make sure you: Wash your hands with soap and water before you change your dressing. If soap and water are not available, use alcohol-based hand sanitizer. Change your dressing as told by your health care provider. Place any used dressings or infusion bags into a plastic bag. Throw that bag in the trash. Keep the dressing that covers the needle clean and dry. Do not get it wet. Do not use scissors or sharp objects near the tube. Keep the tube clamped, unless it is being used. Check your port site every day for signs of infection. Check for: Redness, swelling, or  pain. Fluid or blood. Pus or a bad smell. Protect the skin around the port site. Avoid wearing bra straps that rub or irritate the site. Protect the skin around your port from seat belts. Place a soft pad over your chest if needed. Bathe or shower as told by your health care provider. The site may get wet as long as you are not actively receiving an infusion. Return to your normal activities as told by your health care provider. Ask your health care provider what activities are safe for you. Carry a medical alert card or wear a medical alert bracelet at all times. This will let health care providers know that you have an implanted port in case of an emergency. Get help right away if: You have redness, swelling, or pain at the port site. You have fluid or blood coming from your port site. You have pus or a bad smell coming from the port site. You have a fever. Summary Implanted ports are usually placed in the chest for long-term IV access. Follow instructions from your health care provider about flushing the port and changing bandages (dressings). Take care of the area around your port by avoiding clothing that puts pressure on the area, and by watching for signs of infection. Protect the skin around your port from seat belts. Place a soft pad over your chest if needed. Get help right away if you have a fever or you have redness, swelling, pain, drainage, or a bad smell at the port site. This information is not intended to replace advice given to you by your health care provider. Make sure you discuss any questions you have with your health care provider. Document Revised: 07/11/2020 Document Reviewed: 09/05/2019 [employer] Patient Education  2022 [employer]  

## 2021-03-09 ENCOUNTER — Other Ambulatory Visit: Payer: Self-pay | Admitting: Oncology

## 2021-03-13 ENCOUNTER — Other Ambulatory Visit: Payer: Self-pay

## 2021-03-13 ENCOUNTER — Telehealth: Payer: Self-pay

## 2021-03-13 ENCOUNTER — Inpatient Hospital Stay: Payer: Medicare Other

## 2021-03-13 ENCOUNTER — Inpatient Hospital Stay (HOSPITAL_BASED_OUTPATIENT_CLINIC_OR_DEPARTMENT_OTHER): Payer: Medicare Other | Admitting: Oncology

## 2021-03-13 ENCOUNTER — Inpatient Hospital Stay: Payer: Medicare Other | Attending: Oncology

## 2021-03-13 VITALS — BP 126/65 | HR 79 | Temp 97.8°F | Resp 20 | Ht 76.0 in | Wt 249.2 lb

## 2021-03-13 DIAGNOSIS — I129 Hypertensive chronic kidney disease with stage 1 through stage 4 chronic kidney disease, or unspecified chronic kidney disease: Secondary | ICD-10-CM | POA: Insufficient documentation

## 2021-03-13 DIAGNOSIS — C189 Malignant neoplasm of colon, unspecified: Secondary | ICD-10-CM | POA: Diagnosis not present

## 2021-03-13 DIAGNOSIS — Z5112 Encounter for antineoplastic immunotherapy: Secondary | ICD-10-CM | POA: Insufficient documentation

## 2021-03-13 DIAGNOSIS — N189 Chronic kidney disease, unspecified: Secondary | ICD-10-CM | POA: Diagnosis not present

## 2021-03-13 DIAGNOSIS — Z452 Encounter for adjustment and management of vascular access device: Secondary | ICD-10-CM | POA: Insufficient documentation

## 2021-03-13 DIAGNOSIS — G620 Drug-induced polyneuropathy: Secondary | ICD-10-CM | POA: Diagnosis not present

## 2021-03-13 DIAGNOSIS — Z5111 Encounter for antineoplastic chemotherapy: Secondary | ICD-10-CM | POA: Diagnosis not present

## 2021-03-13 DIAGNOSIS — C187 Malignant neoplasm of sigmoid colon: Secondary | ICD-10-CM | POA: Insufficient documentation

## 2021-03-13 DIAGNOSIS — C774 Secondary and unspecified malignant neoplasm of inguinal and lower limb lymph nodes: Secondary | ICD-10-CM | POA: Insufficient documentation

## 2021-03-13 DIAGNOSIS — D6959 Other secondary thrombocytopenia: Secondary | ICD-10-CM | POA: Insufficient documentation

## 2021-03-13 LAB — CBC WITH DIFFERENTIAL (CANCER CENTER ONLY)
Abs Immature Granulocytes: 0 10*3/uL (ref 0.00–0.07)
Basophils Absolute: 0 10*3/uL (ref 0.0–0.1)
Basophils Relative: 0 %
Eosinophils Absolute: 0.2 10*3/uL (ref 0.0–0.5)
Eosinophils Relative: 6 %
HCT: 31 % — ABNORMAL LOW (ref 39.0–52.0)
Hemoglobin: 9.3 g/dL — ABNORMAL LOW (ref 13.0–17.0)
Immature Granulocytes: 0 %
Lymphocytes Relative: 28 %
Lymphs Abs: 0.9 10*3/uL (ref 0.7–4.0)
MCH: 25.4 pg — ABNORMAL LOW (ref 26.0–34.0)
MCHC: 30 g/dL (ref 30.0–36.0)
MCV: 84.7 fL (ref 80.0–100.0)
Monocytes Absolute: 0.3 10*3/uL (ref 0.1–1.0)
Monocytes Relative: 10 %
Neutro Abs: 1.8 10*3/uL (ref 1.7–7.7)
Neutrophils Relative %: 56 %
Platelet Count: 138 10*3/uL — ABNORMAL LOW (ref 150–400)
RBC: 3.66 MIL/uL — ABNORMAL LOW (ref 4.22–5.81)
RDW: 18.2 % — ABNORMAL HIGH (ref 11.5–15.5)
WBC Count: 3.2 10*3/uL — ABNORMAL LOW (ref 4.0–10.5)
nRBC: 0 % (ref 0.0–0.2)

## 2021-03-13 LAB — CMP (CANCER CENTER ONLY)
ALT: 10 U/L (ref 0–44)
AST: 9 U/L — ABNORMAL LOW (ref 15–41)
Albumin: 4.1 g/dL (ref 3.5–5.0)
Alkaline Phosphatase: 79 U/L (ref 38–126)
Anion gap: 9 (ref 5–15)
BUN: 36 mg/dL — ABNORMAL HIGH (ref 8–23)
CO2: 22 mmol/L (ref 22–32)
Calcium: 8.9 mg/dL (ref 8.9–10.3)
Chloride: 108 mmol/L (ref 98–111)
Creatinine: 2.5 mg/dL — ABNORMAL HIGH (ref 0.61–1.24)
GFR, Estimated: 26 mL/min — ABNORMAL LOW (ref >60)
Glucose, Bld: 165 mg/dL — ABNORMAL HIGH (ref 70–99)
Potassium: 4.2 mmol/L (ref 3.5–5.1)
Sodium: 139 mmol/L (ref 135–145)
Total Bilirubin: 0.5 mg/dL (ref 0.3–1.2)
Total Protein: 7 g/dL (ref 6.5–8.1)

## 2021-03-13 LAB — TOTAL PROTEIN, URINE DIPSTICK: Protein, ur: 30 mg/dL — AB

## 2021-03-13 MED ORDER — SODIUM CHLORIDE 0.9 % IV SOLN: Freq: Once | INTRAVENOUS | Status: AC

## 2021-03-13 MED ORDER — SODIUM CHLORIDE 0.9 % IV SOLN
5.0000 mg/kg | Freq: Once | INTRAVENOUS | Status: AC
  Administered 2021-03-13: 600 mg via INTRAVENOUS
  Filled 2021-03-13: qty 16

## 2021-03-13 MED ORDER — PALONOSETRON HCL INJECTION 0.25 MG/5ML
0.2500 mg | Freq: Once | INTRAVENOUS | Status: AC
  Administered 2021-03-13: 0.25 mg via INTRAVENOUS
  Filled 2021-03-13: qty 5

## 2021-03-13 MED ORDER — ATROPINE SULFATE 1 MG/ML IV SOLN
0.5000 mg | Freq: Once | INTRAVENOUS | Status: AC | PRN
  Administered 2021-03-13: 0.5 mg via INTRAVENOUS
  Filled 2021-03-13: qty 1

## 2021-03-13 MED ORDER — SODIUM CHLORIDE 0.9 % IV SOLN
10.0000 mg | Freq: Once | INTRAVENOUS | Status: AC
  Administered 2021-03-13: 10 mg via INTRAVENOUS
  Filled 2021-03-13: qty 1

## 2021-03-13 MED ORDER — SODIUM CHLORIDE 0.9 % IV SOLN
145.0000 mg/m2 | Freq: Once | INTRAVENOUS | Status: AC
  Administered 2021-03-13: 360 mg via INTRAVENOUS
  Filled 2021-03-13: qty 15

## 2021-03-13 MED ORDER — FLUOROURACIL CHEMO INJECTION 2.5 GM/50ML
300.0000 mg/m2 | Freq: Once | INTRAVENOUS | Status: AC
  Administered 2021-03-13: 750 mg via INTRAVENOUS
  Filled 2021-03-13: qty 15

## 2021-03-13 MED ORDER — SODIUM CHLORIDE 0.9 % IV SOLN
1800.0000 mg/m2 | INTRAVENOUS | Status: DC
  Administered 2021-03-13: 4450 mg via INTRAVENOUS
  Filled 2021-03-13: qty 89

## 2021-03-13 MED ORDER — SODIUM CHLORIDE 0.9 % IV SOLN
300.0000 mg/m2 | Freq: Once | INTRAVENOUS | Status: AC
  Administered 2021-03-13: 738 mg via INTRAVENOUS
  Filled 2021-03-13: qty 36.9

## 2021-03-13 NOTE — Telephone Encounter (Addendum)
Patient seen by Dr. Benay Spice today  Vitals are within treatment parameters.  Labs reviewed by Dr. Benay Spice and are not all within treatment parameters. Creatinine 2.5 ok to treat  Per physician team, patient is ready for treatment and there are NO modifications to the treatment plan.

## 2021-03-13 NOTE — Progress Notes (Signed)
Patient presents for treatment. RN assessment completed along with the following:  Labs/vitals reviewed - Yes, and per Dr. Benay Spice, ok to treat with Scr 2.5.    Weight within 10% of previous measurement - Yes Oncology Treatment Attestation completed for current therapy- Yes, on date 02/13/21 Informed consent completed and reflects current therapy/intent - Yes, on date 02/27/21             Provider progress note reviewed - Yes, today's provider note was reviewed. Treatment/Antibody/Supportive plan reviewed - Yes, and there are no adjustments needed for today's treatment. S&H and other orders reviewed - Yes, and there are no additional orders identified. Previous treatment date reviewed - Yes, and the appropriate amount of time has elapsed between treatments.   Patient to proceed with treatment.

## 2021-03-13 NOTE — Patient Instructions (Signed)
[name]  Discharge Instructions: Thank you for choosing [facility] to provide your oncology and hematology care.   If you have a lab appointment with the [facility], please go directly to the [facility] and check in at the registration area.   Wear comfortable clothing and clothing appropriate for easy access to any Portacath or PICC line.   We strive to give you quality time with your provider. You may need to reschedule your appointment if you arrive late (15 or more minutes).  Arriving late affects you and other patients whose appointments are after yours.  Also, if you miss three or more appointments without notifying the office, you may be dismissed from the clinic at the provider's discretion.      For prescription refill requests, have your pharmacy contact our office and allow 72 hours for refills to be completed.    Today you received the following chemotherapy and/or immunotherapy agents: bevacizumab, irinotecan, leucovorin, fluorouracil.       To help prevent nausea and vomiting after your treatment, we encourage you to take your nausea medication as directed.  BELOW ARE SYMPTOMS THAT SHOULD BE REPORTED IMMEDIATELY: *FEVER GREATER THAN 100.4 F (38 C) OR HIGHER *CHILLS OR SWEATING *NAUSEA AND VOMITING THAT IS NOT CONTROLLED WITH YOUR NAUSEA MEDICATION *UNUSUAL SHORTNESS OF BREATH *UNUSUAL BRUISING OR BLEEDING *URINARY PROBLEMS (pain or burning when urinating, or frequent urination) *BOWEL PROBLEMS (unusual diarrhea, constipation, pain near the anus) TENDERNESS IN MOUTH AND THROAT WITH OR WITHOUT PRESENCE OF ULCERS (sore throat, sores in mouth, or a toothache) UNUSUAL RASH, SWELLING OR PAIN  UNUSUAL VAGINAL DISCHARGE OR ITCHING   Items with * indicate a potential emergency and should be followed up as soon as possible or go to the Emergency Department if any problems should occur.  Please show the CHEMOTHERAPY ALERT CARD or  IMMUNOTHERAPY ALERT CARD at check-in to the Emergency Department and triage nurse.  Should you have questions after your visit or need to cancel or reschedule your appointment, please contact [facility]  Dept: [phone]  and follow the prompts.  Office hours are 8:00 a.m. to 4:30 p.m. Monday - Friday. Please note that voicemails left after 4:00 p.m. may not be returned until the following business day.  We are closed weekends and major holidays. You have access to a nurse at all times for urgent questions. Please call the main number to the clinic Dept: [phone] and follow the prompts.   For any non-urgent questions, you may also contact your provider using MyChart. We now offer e-Visits for anyone 77 and older to request care online for non-urgent symptoms. For details visit [email or web address].   Also download the MyChart app! Go to the app store, search "MyChart", open the app, select [facility], and log in with your MyChart username and password.  Due to Covid, a mask is required upon entering the hospital/clinic. If you do not have a mask, one will be given to you upon arrival. For doctor visits, patients may have 1 support person aged 46 or older with them. For treatment visits, patients cannot have anyone with them due to current Covid guidelines and our immunocompromised population.   Bevacizumab injection What is this medication? BEVACIZUMAB (be va SIZ yoo mab) is a monoclonal antibody. It is used to treat many types of cancer. This medicine may be used for other purposes; ask your health care provider or pharmacist if you have questions. COMMON  BRAND NAME(S): Avastin, MVASI, Noah Charon What should I tell my care team before I take this medication? They need to know if you have any of these conditions: diabetes heart disease high blood pressure history of coughing up blood prior anthracycline chemotherapy (e.g., doxorubicin, daunorubicin,  epirubicin) recent or ongoing radiation therapy recent or planning to have surgery stroke an unusual or allergic reaction to bevacizumab, hamster proteins, mouse proteins, other medicines, foods, dyes, or preservatives pregnant or trying to get pregnant breast-feeding How should I use this medication? This medicine is for infusion into a vein. It is given by a health care professional in a hospital or clinic setting. Talk to your pediatrician regarding the use of this medicine in children. Special care may be needed. Overdosage: If you think you have taken too much of this medicine contact a poison control center or emergency room at once. NOTE: This medicine is only for you. Do not share this medicine with others. What if I miss a dose? It is important not to miss your dose. Call your doctor or health care professional if you are unable to keep an appointment. What may interact with this medication? Interactions are not expected. This list may not describe all possible interactions. Give your health care provider a list of all the medicines, herbs, non-prescription drugs, or dietary supplements you use. Also tell them if you smoke, drink alcohol, or use illegal drugs. Some items may interact with your medicine. What should I watch for while using this medication? Your condition will be monitored carefully while you are receiving this medicine. You will need important blood work and urine testing done while you are taking this medicine. This medicine may increase your risk to bruise or bleed. Call your doctor or health care professional if you notice any unusual bleeding. Before having surgery, talk to your health care provider to make sure it is ok. This drug can increase the risk of poor healing of your surgical site or wound. You will need to stop this drug for 28 days before surgery. After surgery, wait at least 28 days before restarting this drug. Make sure the surgical site or wound is  healed enough before restarting this drug. Talk to your health care provider if questions. Do not become pregnant while taking this medicine or for 6 months after stopping it. Women should inform their doctor if they wish to become pregnant or think they might be pregnant. There is a potential for serious side effects to an unborn child. Talk to your health care professional or pharmacist for more information. Do not breast-feed an infant while taking this medicine and for 6 months after the last dose. This medicine has caused ovarian failure in some women. This medicine may interfere with the ability to have a child. You should talk to your doctor or health care professional if you are concerned about your fertility. What side effects may I notice from receiving this medication? Side effects that you should report to your doctor or health care professional as soon as possible: allergic reactions like skin rash, itching or hives, swelling of the face, lips, or tongue chest pain or chest tightness chills coughing up blood high fever seizures severe constipation signs and symptoms of bleeding such as bloody or black, tarry stools; red or dark-brown urine; spitting up blood or brown material that looks like coffee grounds; red spots on the skin; unusual bruising or bleeding from the eye, gums, or nose signs and symptoms of a blood clot such  as breathing problems; chest pain; severe, sudden headache; pain, swelling, warmth in the leg signs and symptoms of a stroke like changes in vision; confusion; trouble speaking or understanding; severe headaches; sudden numbness or weakness of the face, arm or leg; trouble walking; dizziness; loss of balance or coordination stomach pain sweating swelling of legs or ankles vomiting weight gain Side effects that usually do not require medical attention (report to your doctor or health care professional if they continue or are bothersome): back pain changes in  taste decreased appetite dry skin nausea tiredness This list may not describe all possible side effects. Call your doctor for medical advice about side effects. You may report side effects to FDA at 1-800-FDA-1088. Where should I keep my medication? This drug is given in a hospital or clinic and will not be stored at home. NOTE: This sheet is a summary. It may not cover all possible information. If you have questions about this medicine, talk to your doctor, pharmacist, or health care provider.  2022 Elsevier/Gold Standard (2019-02-16 10:50:46)  Irinotecan injection What is this medication? IRINOTECAN (ir in oh TEE kan ) is a chemotherapy drug. It is used to treat colon and rectal cancer. This medicine may be used for other purposes; ask your health care provider or pharmacist if you have questions. COMMON BRAND NAME(S): Camptosar What should I tell my care team before I take this medication? They need to know if you have any of these conditions: dehydration diarrhea infection (especially a virus infection such as chickenpox, cold sores, or herpes) liver disease low blood counts, like low white cell, platelet, or red cell counts low levels of calcium, magnesium, or potassium in the blood recent or ongoing radiation therapy an unusual or allergic reaction to irinotecan, other medicines, foods, dyes, or preservatives pregnant or trying to get pregnant breast-feeding How should I use this medication? This drug is given as an infusion into a vein. It is administered in a hospital or clinic by a specially trained health care professional. Talk to your pediatrician regarding the use of this medicine in children. Special care may be needed. Overdosage: If you think you have taken too much of this medicine contact a poison control center or emergency room at once. NOTE: This medicine is only for you. Do not share this medicine with others. What if I miss a dose? It is important not to miss  your dose. Call your doctor or health care professional if you are unable to keep an appointment. What may interact with this medication? Do not take this medicine with any of the following medications: cobicistat itraconazole This medicine may interact with the following medications: antiviral medicines for HIV or AIDS certain antibiotics like rifampin or rifabutin certain medicines for fungal infections like ketoconazole, posaconazole, and voriconazole certain medicines for seizures like carbamazepine, phenobarbital, phenotoin clarithromycin gemfibrozil nefazodone St. John's Wort This list may not describe all possible interactions. Give your health care provider a list of all the medicines, herbs, non-prescription drugs, or dietary supplements you use. Also tell them if you smoke, drink alcohol, or use illegal drugs. Some items may interact with your medicine. What should I watch for while using this medication? Your condition will be monitored carefully while you are receiving this medicine. You will need important blood work done while you are taking this medicine. This drug may make you feel generally unwell. This is not uncommon, as chemotherapy can affect healthy cells as well as cancer cells. Report any side effects. Continue  your course of treatment even though you feel ill unless your doctor tells you to stop. In some cases, you may be given additional medicines to help with side effects. Follow all directions for their use. You may get drowsy or dizzy. Do not drive, use machinery, or do anything that needs mental alertness until you know how this medicine affects you. Do not stand or sit up quickly, especially if you are an older patient. This reduces the risk of dizzy or fainting spells. Call your health care professional for advice if you get a fever, chills, or sore throat, or other symptoms of a cold or flu. Do not treat yourself. This medicine decreases your body's ability to  fight infections. Try to avoid being around people who are sick. Avoid taking products that contain aspirin, acetaminophen, ibuprofen, naproxen, or ketoprofen unless instructed by your doctor. These medicines may hide a fever. This medicine may increase your risk to bruise or bleed. Call your doctor or health care professional if you notice any unusual bleeding. Be careful brushing and flossing your teeth or using a toothpick because you may get an infection or bleed more easily. If you have any dental work done, tell your dentist you are receiving this medicine. Do not become pregnant while taking this medicine or for 6 months after stopping it. Women should inform their health care professional if they wish to become pregnant or think they might be pregnant. Men should not father a child while taking this medicine and for 3 months after stopping it. There is potential for serious side effects to an unborn child. Talk to your health care professional for more information. Do not breast-feed an infant while taking this medicine or for 7 days after stopping it. This medicine has caused ovarian failure in some women. This medicine may make it more difficult to get pregnant. Talk to your health care professional if you are concerned about your fertility. This medicine has caused decreased sperm counts in some men. This may make it more difficult to father a child. Talk to your health care professional if you are concerned about your fertility. What side effects may I notice from receiving this medication? Side effects that you should report to your doctor or health care professional as soon as possible: allergic reactions like skin rash, itching or hives, swelling of the face, lips, or tongue chest pain diarrhea flushing, runny nose, sweating during infusion low blood counts - this medicine may decrease the number of white blood cells, red blood cells and platelets. You may be at increased risk for  infections and bleeding. nausea, vomiting pain, swelling, warmth in the leg signs of decreased platelets or bleeding - bruising, pinpoint red spots on the skin, black, tarry stools, blood in the urine signs of infection - fever or chills, cough, sore throat, pain or difficulty passing urine signs of decreased red blood cells - unusually weak or tired, fainting spells, lightheadedness Side effects that usually do not require medical attention (report to your doctor or health care professional if they continue or are bothersome): constipation hair loss headache loss of appetite mouth sores stomach pain This list may not describe all possible side effects. Call your doctor for medical advice about side effects. You may report side effects to FDA at 1-800-FDA-1088. Where should I keep my medication? This drug is given in a hospital or clinic and will not be stored at home. NOTE: This sheet is a summary. It may not cover all possible information.  If you have questions about this medicine, talk to your doctor, pharmacist, or health care provider.  2022 Elsevier/Gold Standard (2019-03-22 17:46:13)  Leucovorin injection What is this medication? LEUCOVORIN (loo koe VOR in) is used to prevent or treat the harmful effects of some medicines. This medicine is used to treat anemia caused by a low amount of folic acid in the body. It is also used with 5-fluorouracil (5-FU) to treat colon cancer. This medicine may be used for other purposes; ask your health care provider or pharmacist if you have questions. What should I tell my care team before I take this medication? They need to know if you have any of these conditions: anemia from low levels of vitamin B-12 in the blood an unusual or allergic reaction to leucovorin, folic acid, other medicines, foods, dyes, or preservatives pregnant or trying to get pregnant breast-feeding How should I use this medication? This medicine is for injection into a  muscle or into a vein. It is given by a health care professional in a hospital or clinic setting. Talk to your pediatrician regarding the use of this medicine in children. Special care may be needed. Overdosage: If you think you have taken too much of this medicine contact a poison control center or emergency room at once. NOTE: This medicine is only for you. Do not share this medicine with others. What if I miss a dose? This does not apply. What may interact with this medication? capecitabine fluorouracil phenobarbital phenytoin primidone trimethoprim-sulfamethoxazole This list may not describe all possible interactions. Give your health care provider a list of all the medicines, herbs, non-prescription drugs, or dietary supplements you use. Also tell them if you smoke, drink alcohol, or use illegal drugs. Some items may interact with your medicine. What should I watch for while using this medication? Your condition will be monitored carefully while you are receiving this medicine. This medicine may increase the side effects of 5-fluorouracil, 5-FU. Tell your doctor or health care professional if you have diarrhea or mouth sores that do not get better or that get worse. What side effects may I notice from receiving this medication? Side effects that you should report to your doctor or health care professional as soon as possible: allergic reactions like skin rash, itching or hives, swelling of the face, lips, or tongue breathing problems fever, infection mouth sores unusual bleeding or bruising unusually weak or tired Side effects that usually do not require medical attention (report to your doctor or health care professional if they continue or are bothersome): constipation or diarrhea loss of appetite nausea, vomiting This list may not describe all possible side effects. Call your doctor for medical advice about side effects. You may report side effects to FDA at  1-800-FDA-1088. Where should I keep my medication? This drug is given in a hospital or clinic and will not be stored at home. NOTE: This sheet is a summary. It may not cover all possible information. If you have questions about this medicine, talk to your doctor, pharmacist, or health care provider.  2022 Elsevier/Gold Standard (2007-10-26 16:50:29)  Fluorouracil, 5-FU injection What is this medication? FLUOROURACIL, 5-FU (flure oh YOOR a sil) is a chemotherapy drug. It slows the growth of cancer cells. This medicine is used to treat many types of cancer like breast cancer, colon or rectal cancer, pancreatic cancer, and stomach cancer. This medicine may be used for other purposes; ask your health care provider or pharmacist if you have questions. COMMON BRAND NAME(S): Adrucil  What should I tell my care team before I take this medication? They need to know if you have any of these conditions: blood disorders dihydropyrimidine dehydrogenase (DPD) deficiency infection (especially a virus infection such as chickenpox, cold sores, or herpes) kidney disease liver disease malnourished, poor nutrition recent or ongoing radiation therapy an unusual or allergic reaction to fluorouracil, other chemotherapy, other medicines, foods, dyes, or preservatives pregnant or trying to get pregnant breast-feeding How should I use this medication? This drug is given as an infusion or injection into a vein. It is administered in a hospital or clinic by a specially trained health care professional. Talk to your pediatrician regarding the use of this medicine in children. Special care may be needed. Overdosage: If you think you have taken too much of this medicine contact a poison control center or emergency room at once. NOTE: This medicine is only for you. Do not share this medicine with others. What if I miss a dose? It is important not to miss your dose. Call your doctor or health care professional if you  are unable to keep an appointment. What may interact with this medication? Do not take this medicine with any of the following medications: live virus vaccines This medicine may also interact with the following medications: medicines that treat or prevent blood clots like warfarin, enoxaparin, and dalteparin This list may not describe all possible interactions. Give your health care provider a list of all the medicines, herbs, non-prescription drugs, or dietary supplements you use. Also tell them if you smoke, drink alcohol, or use illegal drugs. Some items may interact with your medicine. What should I watch for while using this medication? Visit your doctor for checks on your progress. This drug may make you feel generally unwell. This is not uncommon, as chemotherapy can affect healthy cells as well as cancer cells. Report any side effects. Continue your course of treatment even though you feel ill unless your doctor tells you to stop. In some cases, you may be given additional medicines to help with side effects. Follow all directions for their use. Call your doctor or health care professional for advice if you get a fever, chills or sore throat, or other symptoms of a cold or flu. Do not treat yourself. This drug decreases your body's ability to fight infections. Try to avoid being around people who are sick. This medicine may increase your risk to bruise or bleed. Call your doctor or health care professional if you notice any unusual bleeding. Be careful brushing and flossing your teeth or using a toothpick because you may get an infection or bleed more easily. If you have any dental work done, tell your dentist you are receiving this medicine. Avoid taking products that contain aspirin, acetaminophen, ibuprofen, naproxen, or ketoprofen unless instructed by your doctor. These medicines may hide a fever. Do not become pregnant while taking this medicine. Women should inform their doctor if they  wish to become pregnant or think they might be pregnant. There is a potential for serious side effects to an unborn child. Talk to your health care professional or pharmacist for more information. Do not breast-feed an infant while taking this medicine. Men should inform their doctor if they wish to father a child. This medicine may lower sperm counts. Do not treat diarrhea with over the counter products. Contact your doctor if you have diarrhea that lasts more than 2 days or if it is severe and watery. This medicine can make you more sensitive  to the sun. Keep out of the sun. If you cannot avoid being in the sun, wear protective clothing and use sunscreen. Do not use sun lamps or tanning beds/booths. What side effects may I notice from receiving this medication? Side effects that you should report to your doctor or health care professional as soon as possible: allergic reactions like skin rash, itching or hives, swelling of the face, lips, or tongue low blood counts - this medicine may decrease the number of white blood cells, red blood cells and platelets. You may be at increased risk for infections and bleeding. signs of infection - fever or chills, cough, sore throat, pain or difficulty passing urine signs of decreased platelets or bleeding - bruising, pinpoint red spots on the skin, black, tarry stools, blood in the urine signs of decreased red blood cells - unusually weak or tired, fainting spells, lightheadedness breathing problems changes in vision chest pain mouth sores nausea and vomiting pain, swelling, redness at site where injected pain, tingling, numbness in the hands or feet redness, swelling, or sores on hands or feet stomach pain unusual bleeding Side effects that usually do not require medical attention (report to your doctor or health care professional if they continue or are bothersome): changes in finger or toe nails diarrhea dry or itchy skin hair loss headache loss  of appetite sensitivity of eyes to the light stomach upset unusually teary eyes This list may not describe all possible side effects. Call your doctor for medical advice about side effects. You may report side effects to FDA at 1-800-FDA-1088. Where should I keep my medication? This drug is given in a hospital or clinic and will not be stored at home. NOTE: This sheet is a summary. It may not cover all possible information. If you have questions about this medicine, talk to your doctor, pharmacist, or health care provider.  2022 Elsevier/Gold Standard (2019-03-22 15:00:03)

## 2021-03-13 NOTE — Patient Instructions (Signed)
[facility]  Discharge Instructions: Thank you for choosing [facility] to provide your oncology and hematology care.   If you have a lab appointment with the [facility], please go directly to the [facility] and check in at the registration area.   Wear comfortable clothing and clothing appropriate for easy access to any Portacath or PICC line.   We strive to give you quality time with your provider. You may need to reschedule your appointment if you arrive late (15 or more minutes).  Arriving late affects you and other patients whose appointments are after yours.  Also, if you miss three or more appointments without notifying the office, you may be dismissed from the clinic at the provider's discretion.      For prescription refill requests, have your pharmacy contact our office and allow 72 hours for refills to be completed.    Today you received the following chemotherapy and/or immunotherapy agents: bevacizumab, irinotecan, leucovorin, fluorouracil.       To help prevent nausea and vomiting after your treatment, we encourage you to take your nausea medication as directed.  BELOW ARE SYMPTOMS THAT SHOULD BE REPORTED IMMEDIATELY: *FEVER GREATER THAN 100.4 F (38 C) OR HIGHER *CHILLS OR SWEATING *NAUSEA AND VOMITING THAT IS NOT CONTROLLED WITH YOUR NAUSEA MEDICATION *UNUSUAL SHORTNESS OF BREATH *UNUSUAL BRUISING OR BLEEDING *URINARY PROBLEMS (pain or burning when urinating, or frequent urination) *BOWEL PROBLEMS (unusual diarrhea, constipation, pain near the anus) TENDERNESS IN MOUTH AND THROAT WITH OR WITHOUT PRESENCE OF ULCERS (sore throat, sores in mouth, or a toothache) UNUSUAL RASH, SWELLING OR PAIN  UNUSUAL VAGINAL DISCHARGE OR ITCHING   Items with * indicate a potential emergency and should be followed up as soon as possible or go to the Emergency Department if any problems should occur.  Please show the CHEMOTHERAPY ALERT CARD or  IMMUNOTHERAPY ALERT CARD at check-in to the Emergency Department and triage nurse.  Should you have questions after your visit or need to cancel or reschedule your appointment, please contact [facility]  Dept: [phone]  and follow the prompts.  Office hours are 8:00 a.m. to 4:30 p.m. Monday - Friday. Please note that voicemails left after 4:00 p.m. may not be returned until the following business day.  We are closed weekends and major holidays. You have access to a nurse at all times for urgent questions. Please call the main number to the clinic Dept: [phone] and follow the prompts.   For any non-urgent questions, you may also contact your provider using MyChart. We now offer e-Visits for anyone 1 and older to request care online for non-urgent symptoms. For details visit [email or web address].   Also download the MyChart app! Go to the app store, search "MyChart", open the app, select [facility], and log in with your MyChart username and password.  Due to Covid, a mask is required upon entering the hospital/clinic. If you do not have a mask, one will be given to you upon arrival. For doctor visits, patients may have 1 support person aged 59 or older with them. For treatment visits, patients cannot have anyone with them due to current Covid guidelines and our immunocompromised population.   Bevacizumab injection What is this medication? BEVACIZUMAB (be va SIZ yoo mab) is a monoclonal antibody. It is used to treat many types of cancer. This medicine may be used for other purposes; ask your health care provider or pharmacist if you have questions. COMMON  BRAND NAME(S): Avastin, MVASI, Noah Charon What should I tell my care team before I take this medication? They need to know if you have any of these conditions: diabetes heart disease high blood pressure history of coughing up blood prior anthracycline chemotherapy (e.g., doxorubicin, daunorubicin,  epirubicin) recent or ongoing radiation therapy recent or planning to have surgery stroke an unusual or allergic reaction to bevacizumab, hamster proteins, mouse proteins, other medicines, foods, dyes, or preservatives pregnant or trying to get pregnant breast-feeding How should I use this medication? This medicine is for infusion into a vein. It is given by a health care professional in a hospital or clinic setting. Talk to your pediatrician regarding the use of this medicine in children. Special care may be needed. Overdosage: If you think you have taken too much of this medicine contact a poison control center or emergency room at once. NOTE: This medicine is only for you. Do not share this medicine with others. What if I miss a dose? It is important not to miss your dose. Call your doctor or health care professional if you are unable to keep an appointment. What may interact with this medication? Interactions are not expected. This list may not describe all possible interactions. Give your health care provider a list of all the medicines, herbs, non-prescription drugs, or dietary supplements you use. Also tell them if you smoke, drink alcohol, or use illegal drugs. Some items may interact with your medicine. What should I watch for while using this medication? Your condition will be monitored carefully while you are receiving this medicine. You will need important blood work and urine testing done while you are taking this medicine. This medicine may increase your risk to bruise or bleed. Call your doctor or health care professional if you notice any unusual bleeding. Before having surgery, talk to your health care provider to make sure it is ok. This drug can increase the risk of poor healing of your surgical site or wound. You will need to stop this drug for 28 days before surgery. After surgery, wait at least 28 days before restarting this drug. Make sure the surgical site or wound is  healed enough before restarting this drug. Talk to your health care provider if questions. Do not become pregnant while taking this medicine or for 6 months after stopping it. Women should inform their doctor if they wish to become pregnant or think they might be pregnant. There is a potential for serious side effects to an unborn child. Talk to your health care professional or pharmacist for more information. Do not breast-feed an infant while taking this medicine and for 6 months after the last dose. This medicine has caused ovarian failure in some women. This medicine may interfere with the ability to have a child. You should talk to your doctor or health care professional if you are concerned about your fertility. What side effects may I notice from receiving this medication? Side effects that you should report to your doctor or health care professional as soon as possible: allergic reactions like skin rash, itching or hives, swelling of the face, lips, or tongue chest pain or chest tightness chills coughing up blood high fever seizures severe constipation signs and symptoms of bleeding such as bloody or black, tarry stools; red or dark-brown urine; spitting up blood or brown material that looks like coffee grounds; red spots on the skin; unusual bruising or bleeding from the eye, gums, or nose signs and symptoms of a blood clot such  as breathing problems; chest pain; severe, sudden headache; pain, swelling, warmth in the leg signs and symptoms of a stroke like changes in vision; confusion; trouble speaking or understanding; severe headaches; sudden numbness or weakness of the face, arm or leg; trouble walking; dizziness; loss of balance or coordination stomach pain sweating swelling of legs or ankles vomiting weight gain Side effects that usually do not require medical attention (report to your doctor or health care professional if they continue or are bothersome): back pain changes in  taste decreased appetite dry skin nausea tiredness This list may not describe all possible side effects. Call your doctor for medical advice about side effects. You may report side effects to FDA at 1-800-FDA-1088. Where should I keep my medication? This drug is given in a hospital or clinic and will not be stored at home. NOTE: This sheet is a summary. It may not cover all possible information. If you have questions about this medicine, talk to your doctor, pharmacist, or health care provider.  2022 Elsevier/Gold Standard (2019-02-16 10:50:46)  Irinotecan injection What is this medication? IRINOTECAN (ir in oh TEE kan ) is a chemotherapy drug. It is used to treat colon and rectal cancer. This medicine may be used for other purposes; ask your health care provider or pharmacist if you have questions. COMMON BRAND NAME(S): Camptosar What should I tell my care team before I take this medication? They need to know if you have any of these conditions: dehydration diarrhea infection (especially a virus infection such as chickenpox, cold sores, or herpes) liver disease low blood counts, like low white cell, platelet, or red cell counts low levels of calcium, magnesium, or potassium in the blood recent or ongoing radiation therapy an unusual or allergic reaction to irinotecan, other medicines, foods, dyes, or preservatives pregnant or trying to get pregnant breast-feeding How should I use this medication? This drug is given as an infusion into a vein. It is administered in a hospital or clinic by a specially trained health care professional. Talk to your pediatrician regarding the use of this medicine in children. Special care may be needed. Overdosage: If you think you have taken too much of this medicine contact a poison control center or emergency room at once. NOTE: This medicine is only for you. Do not share this medicine with others. What if I miss a dose? It is important not to miss  your dose. Call your doctor or health care professional if you are unable to keep an appointment. What may interact with this medication? Do not take this medicine with any of the following medications: cobicistat itraconazole This medicine may interact with the following medications: antiviral medicines for HIV or AIDS certain antibiotics like rifampin or rifabutin certain medicines for fungal infections like ketoconazole, posaconazole, and voriconazole certain medicines for seizures like carbamazepine, phenobarbital, phenotoin clarithromycin gemfibrozil nefazodone St. John's Wort This list may not describe all possible interactions. Give your health care provider a list of all the medicines, herbs, non-prescription drugs, or dietary supplements you use. Also tell them if you smoke, drink alcohol, or use illegal drugs. Some items may interact with your medicine. What should I watch for while using this medication? Your condition will be monitored carefully while you are receiving this medicine. You will need important blood work done while you are taking this medicine. This drug may make you feel generally unwell. This is not uncommon, as chemotherapy can affect healthy cells as well as cancer cells. Report any side effects. Continue  your course of treatment even though you feel ill unless your doctor tells you to stop. In some cases, you may be given additional medicines to help with side effects. Follow all directions for their use. You may get drowsy or dizzy. Do not drive, use machinery, or do anything that needs mental alertness until you know how this medicine affects you. Do not stand or sit up quickly, especially if you are an older patient. This reduces the risk of dizzy or fainting spells. Call your health care professional for advice if you get a fever, chills, or sore throat, or other symptoms of a cold or flu. Do not treat yourself. This medicine decreases your body's ability to  fight infections. Try to avoid being around people who are sick. Avoid taking products that contain aspirin, acetaminophen, ibuprofen, naproxen, or ketoprofen unless instructed by your doctor. These medicines may hide a fever. This medicine may increase your risk to bruise or bleed. Call your doctor or health care professional if you notice any unusual bleeding. Be careful brushing and flossing your teeth or using a toothpick because you may get an infection or bleed more easily. If you have any dental work done, tell your dentist you are receiving this medicine. Do not become pregnant while taking this medicine or for 6 months after stopping it. Women should inform their health care professional if they wish to become pregnant or think they might be pregnant. Men should not father a child while taking this medicine and for 3 months after stopping it. There is potential for serious side effects to an unborn child. Talk to your health care professional for more information. Do not breast-feed an infant while taking this medicine or for 7 days after stopping it. This medicine has caused ovarian failure in some women. This medicine may make it more difficult to get pregnant. Talk to your health care professional if you are concerned about your fertility. This medicine has caused decreased sperm counts in some men. This may make it more difficult to father a child. Talk to your health care professional if you are concerned about your fertility. What side effects may I notice from receiving this medication? Side effects that you should report to your doctor or health care professional as soon as possible: allergic reactions like skin rash, itching or hives, swelling of the face, lips, or tongue chest pain diarrhea flushing, runny nose, sweating during infusion low blood counts - this medicine may decrease the number of white blood cells, red blood cells and platelets. You may be at increased risk for  infections and bleeding. nausea, vomiting pain, swelling, warmth in the leg signs of decreased platelets or bleeding - bruising, pinpoint red spots on the skin, black, tarry stools, blood in the urine signs of infection - fever or chills, cough, sore throat, pain or difficulty passing urine signs of decreased red blood cells - unusually weak or tired, fainting spells, lightheadedness Side effects that usually do not require medical attention (report to your doctor or health care professional if they continue or are bothersome): constipation hair loss headache loss of appetite mouth sores stomach pain This list may not describe all possible side effects. Call your doctor for medical advice about side effects. You may report side effects to FDA at 1-800-FDA-1088. Where should I keep my medication? This drug is given in a hospital or clinic and will not be stored at home. NOTE: This sheet is a summary. It may not cover all possible information.  If you have questions about this medicine, talk to your doctor, pharmacist, or health care provider.  2022 Elsevier/Gold Standard (2019-03-22 17:46:13)  Leucovorin injection What is this medication? LEUCOVORIN (loo koe VOR in) is used to prevent or treat the harmful effects of some medicines. This medicine is used to treat anemia caused by a low amount of folic acid in the body. It is also used with 5-fluorouracil (5-FU) to treat colon cancer. This medicine may be used for other purposes; ask your health care provider or pharmacist if you have questions. What should I tell my care team before I take this medication? They need to know if you have any of these conditions: anemia from low levels of vitamin B-12 in the blood an unusual or allergic reaction to leucovorin, folic acid, other medicines, foods, dyes, or preservatives pregnant or trying to get pregnant breast-feeding How should I use this medication? This medicine is for injection into a  muscle or into a vein. It is given by a health care professional in a hospital or clinic setting. Talk to your pediatrician regarding the use of this medicine in children. Special care may be needed. Overdosage: If you think you have taken too much of this medicine contact a poison control center or emergency room at once. NOTE: This medicine is only for you. Do not share this medicine with others. What if I miss a dose? This does not apply. What may interact with this medication? capecitabine fluorouracil phenobarbital phenytoin primidone trimethoprim-sulfamethoxazole This list may not describe all possible interactions. Give your health care provider a list of all the medicines, herbs, non-prescription drugs, or dietary supplements you use. Also tell them if you smoke, drink alcohol, or use illegal drugs. Some items may interact with your medicine. What should I watch for while using this medication? Your condition will be monitored carefully while you are receiving this medicine. This medicine may increase the side effects of 5-fluorouracil, 5-FU. Tell your doctor or health care professional if you have diarrhea or mouth sores that do not get better or that get worse. What side effects may I notice from receiving this medication? Side effects that you should report to your doctor or health care professional as soon as possible: allergic reactions like skin rash, itching or hives, swelling of the face, lips, or tongue breathing problems fever, infection mouth sores unusual bleeding or bruising unusually weak or tired Side effects that usually do not require medical attention (report to your doctor or health care professional if they continue or are bothersome): constipation or diarrhea loss of appetite nausea, vomiting This list may not describe all possible side effects. Call your doctor for medical advice about side effects. You may report side effects to FDA at  1-800-FDA-1088. Where should I keep my medication? This drug is given in a hospital or clinic and will not be stored at home. NOTE: This sheet is a summary. It may not cover all possible information. If you have questions about this medicine, talk to your doctor, pharmacist, or health care provider.  2022 Elsevier/Gold Standard (2007-10-26 16:50:29)  Fluorouracil, 5-FU injection What is this medication? FLUOROURACIL, 5-FU (flure oh YOOR a sil) is a chemotherapy drug. It slows the growth of cancer cells. This medicine is used to treat many types of cancer like breast cancer, colon or rectal cancer, pancreatic cancer, and stomach cancer. This medicine may be used for other purposes; ask your health care provider or pharmacist if you have questions. COMMON BRAND NAME(S): Adrucil  What should I tell my care team before I take this medication? They need to know if you have any of these conditions: blood disorders dihydropyrimidine dehydrogenase (DPD) deficiency infection (especially a virus infection such as chickenpox, cold sores, or herpes) kidney disease liver disease malnourished, poor nutrition recent or ongoing radiation therapy an unusual or allergic reaction to fluorouracil, other chemotherapy, other medicines, foods, dyes, or preservatives pregnant or trying to get pregnant breast-feeding How should I use this medication? This drug is given as an infusion or injection into a vein. It is administered in a hospital or clinic by a specially trained health care professional. Talk to your pediatrician regarding the use of this medicine in children. Special care may be needed. Overdosage: If you think you have taken too much of this medicine contact a poison control center or emergency room at once. NOTE: This medicine is only for you. Do not share this medicine with others. What if I miss a dose? It is important not to miss your dose. Call your doctor or health care professional if you  are unable to keep an appointment. What may interact with this medication? Do not take this medicine with any of the following medications: live virus vaccines This medicine may also interact with the following medications: medicines that treat or prevent blood clots like warfarin, enoxaparin, and dalteparin This list may not describe all possible interactions. Give your health care provider a list of all the medicines, herbs, non-prescription drugs, or dietary supplements you use. Also tell them if you smoke, drink alcohol, or use illegal drugs. Some items may interact with your medicine. What should I watch for while using this medication? Visit your doctor for checks on your progress. This drug may make you feel generally unwell. This is not uncommon, as chemotherapy can affect healthy cells as well as cancer cells. Report any side effects. Continue your course of treatment even though you feel ill unless your doctor tells you to stop. In some cases, you may be given additional medicines to help with side effects. Follow all directions for their use. Call your doctor or health care professional for advice if you get a fever, chills or sore throat, or other symptoms of a cold or flu. Do not treat yourself. This drug decreases your body's ability to fight infections. Try to avoid being around people who are sick. This medicine may increase your risk to bruise or bleed. Call your doctor or health care professional if you notice any unusual bleeding. Be careful brushing and flossing your teeth or using a toothpick because you may get an infection or bleed more easily. If you have any dental work done, tell your dentist you are receiving this medicine. Avoid taking products that contain aspirin, acetaminophen, ibuprofen, naproxen, or ketoprofen unless instructed by your doctor. These medicines may hide a fever. Do not become pregnant while taking this medicine. Women should inform their doctor if they  wish to become pregnant or think they might be pregnant. There is a potential for serious side effects to an unborn child. Talk to your health care professional or pharmacist for more information. Do not breast-feed an infant while taking this medicine. Men should inform their doctor if they wish to father a child. This medicine may lower sperm counts. Do not treat diarrhea with over the counter products. Contact your doctor if you have diarrhea that lasts more than 2 days or if it is severe and watery. This medicine can make you more sensitive  to the sun. Keep out of the sun. If you cannot avoid being in the sun, wear protective clothing and use sunscreen. Do not use sun lamps or tanning beds/booths. What side effects may I notice from receiving this medication? Side effects that you should report to your doctor or health care professional as soon as possible: allergic reactions like skin rash, itching or hives, swelling of the face, lips, or tongue low blood counts - this medicine may decrease the number of white blood cells, red blood cells and platelets. You may be at increased risk for infections and bleeding. signs of infection - fever or chills, cough, sore throat, pain or difficulty passing urine signs of decreased platelets or bleeding - bruising, pinpoint red spots on the skin, black, tarry stools, blood in the urine signs of decreased red blood cells - unusually weak or tired, fainting spells, lightheadedness breathing problems changes in vision chest pain mouth sores nausea and vomiting pain, swelling, redness at site where injected pain, tingling, numbness in the hands or feet redness, swelling, or sores on hands or feet stomach pain unusual bleeding Side effects that usually do not require medical attention (report to your doctor or health care professional if they continue or are bothersome): changes in finger or toe nails diarrhea dry or itchy skin hair loss headache loss  of appetite sensitivity of eyes to the light stomach upset unusually teary eyes This list may not describe all possible side effects. Call your doctor for medical advice about side effects. You may report side effects to FDA at 1-800-FDA-1088. Where should I keep my medication? This drug is given in a hospital or clinic and will not be stored at home. NOTE: This sheet is a summary. It may not cover all possible information. If you have questions about this medicine, talk to your doctor, pharmacist, or health care provider.  2022 Elsevier/Gold Standard (2019-03-22 15:00:03)

## 2021-03-13 NOTE — Progress Notes (Signed)
[name] OFFICE PROGRESS NOTE   Diagnosis: Colon cancer  INTERVAL HISTORY:   Louis [name] completed cycle 1 FOLFIRI/bevacizumab on 02/27/2021.  He reports an ulcer at the lower inner lip.  He had diarrhea for 2 days following chemotherapy.  Pepto-Bismol helped partially.  No nausea/vomiting.  No bleeding or symptom of thrombosis.  Objective:  Vital signs in last 24 hours:  Blood pressure 126/65, pulse 79, temperature 97.8 F (36.6 C), temperature source Oral, resp. rate 20, height '6\' 4"'  (1.93 m), weight 249 lb 3.2 oz (113 kg), SpO2 100 %.    HEENT: No thrush, healed ulcer at the lower inner lip Lymphatics: 2 cm mobile left axillary node, 4-5 cm left inguinal node Resp: Lungs clear bilaterally Cardio: Regular rate and rhythm GI: No hepatosplenomegaly, nontender, no mass Vascular: No leg edema    Portacath/PICC-without erythema  Lab Results:  Lab Results  Component Value Date   WBC 3.2 (L) 03/13/2021   HGB 9.3 (L) 03/13/2021   HCT 31.0 (L) 03/13/2021   MCV 84.7 03/13/2021   PLT 138 (L) 03/13/2021   NEUTROABS 1.8 03/13/2021    CMP  Lab Results  Component Value Date   NA 139 03/13/2021   K 4.2 03/13/2021   CL 108 03/13/2021   CO2 22 03/13/2021   GLUCOSE 165 (H) 03/13/2021   BUN 36 (H) 03/13/2021   CREATININE 2.50 (H) 03/13/2021   CALCIUM 8.9 03/13/2021   PROT 7.0 03/13/2021   ALBUMIN 4.1 03/13/2021   AST 9 (L) 03/13/2021   ALT 10 03/13/2021   ALKPHOS 79 03/13/2021   BILITOT 0.5 03/13/2021   GFRNONAA 26 (L) 03/13/2021   GFRAA 28 (L) 09/20/2019    Lab Results  Component Value Date   CEA1 2.61 01/02/2021   CEA 3.76 01/02/2021    Medications: I have reviewed the patient's current medications.   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), isolated mesenteric implant-resected, multiple tumor deposits Sigmoid/descending resection and creation of a descending colostomy 04/10/2016 MSI-stable, no loss of mismatch repair  protein expression Foundation 1- BRAF V600E positive, MS-stable, intermediate tumor mutation burden, no RAS mutation CT abdomen/pelvis 04/02/2016-no evidence of distant metastatic disease Cycle 1 FOLFOX 05/21/2016 Cycle 2 FOLFOX 06/04/2016 Cycle 3 FOLFOX 06/18/2016 Cycle 4 FOLFOX 07/02/2016 (oxaliplatin held secondary to thrombocytopenia) Cycle 5 FOLFOX 07/16/2016 Cycle 6 FOLFOX 07/30/2016 Cycle 7 FOLFOX 08/13/2016 (oxaliplatin held and 5-FU dose reduced) Cycle 8 FOLFOX 09/03/2016 (oxaliplatin held secondary to neuropathy) Cycle 9 FOLFOX 09/17/2016 (oxaliplatin held secondary to neuropathy) Cycle 10 FOLFOX 10/02/2016 Cycle 11 FOLFOX 10/15/2016 (oxaliplatin eliminated from the regimen) Cycle 12 FOLFOX 10/30/2016 (oxaliplatin eliminated) CTs 05/12/2017-no evidence of recurrent disease, right inguinal hernia containing bladder Colonoscopy 07/21/2017, 3 polyps were removed from the descending and transverse colon, fragments of tubular and tubulovillous adenoma CTs 05/19/2018-no evidence of recurrent disease, status post hernia repair, right upper lobe pneumonia CTs 05/20/2019-resolution of right upper lobe pneumonia, no evidence of metastatic disease Colonoscopy 01/25/2020-multiple polyps removed-tubular adenomas, poor preparation, repeat colonoscopy recommended CTs neck, chest, and abdomen/pelvis 08/27/2020- new 2 centimeter left axillary node, new 1.9 cm left inguinal node chronic mildly prominent portacaval node Ultrasound biopsy of left inguinal node on 10/29/2020-metastatic adenocarcinoma consistent with colon adenocarcinoma PET [id number] hypermetabolic left inguinal and left axillary nodes, solitary focus of hypermetabolic activity in the right lower quadrant small bowel Cycle 1 FOLFOX 12/19/2020 Cycle 2 FOLFOX 01/02/2021, oxaliplatin dose reduced secondary to neutropenia and thrombocytopenia Cycle 3 FOLFOX 01/16/2021 Cycle 4 FOLFOX 01/30/2021 CTs 02/11/2021-no change in left  inguinal and  left axillary nodes, no evidence of disease progression, stable wall thickening involving a loop of distal ileum Cycle 1 FOLFIRI/Avastin 02/27/2021 Cycle 2 FOLFIRI/Avastin 03/13/2021   2.   Chronic renal insufficiency   3.   Hypertension   4.  Inflamed sebaceous cyst at the upper back-status post incision and drainage   5.  Thrombocytopenia secondary chemotherapy-oxaliplatin dose reduced beginning with cycle 3 FOLFOX, oxaliplatin held with cycle 4 and cycle 7 FOLFOX   6.  Oxaliplatin neuropathy-improved   7.  History of Mucositis secondary chemotherapy   8.  Symptoms of pneumonia January 2020-CT 05/19/2018 consistent with right upper lobe pneumonia, Levaquin prescribed 05/20/2018   9.  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  He tolerated the chemotherapy well other than mild diarrhea and a single lip ulcer.  He will try Imodium for diarrhea following this cycle.  He will contact us for increased diarrhea or mouth sores.  The palpable lymph nodes appear slightly smaller on exam today.  He will return for an office visit in the next cycle of chemotherapy on 04/03/2021.  Betsy Coder, MD  03/13/2021  10:38 AM

## 2021-03-15 ENCOUNTER — Other Ambulatory Visit: Payer: Self-pay

## 2021-03-15 ENCOUNTER — Inpatient Hospital Stay: Payer: Medicare Other

## 2021-03-15 VITALS — BP 154/76 | HR 63 | Temp 98.1°F | Resp 20

## 2021-03-15 DIAGNOSIS — Z5112 Encounter for antineoplastic immunotherapy: Secondary | ICD-10-CM | POA: Diagnosis not present

## 2021-03-15 DIAGNOSIS — Z5111 Encounter for antineoplastic chemotherapy: Secondary | ICD-10-CM | POA: Diagnosis not present

## 2021-03-15 DIAGNOSIS — C187 Malignant neoplasm of sigmoid colon: Secondary | ICD-10-CM | POA: Diagnosis not present

## 2021-03-15 DIAGNOSIS — N189 Chronic kidney disease, unspecified: Secondary | ICD-10-CM | POA: Diagnosis not present

## 2021-03-15 DIAGNOSIS — C189 Malignant neoplasm of colon, unspecified: Secondary | ICD-10-CM

## 2021-03-15 DIAGNOSIS — I129 Hypertensive chronic kidney disease with stage 1 through stage 4 chronic kidney disease, or unspecified chronic kidney disease: Secondary | ICD-10-CM | POA: Diagnosis not present

## 2021-03-15 DIAGNOSIS — C774 Secondary and unspecified malignant neoplasm of inguinal and lower limb lymph nodes: Secondary | ICD-10-CM | POA: Diagnosis not present

## 2021-03-15 MED ORDER — HEPARIN SOD (PORK) LOCK FLUSH 100 UNIT/ML IV SOLN
500.0000 [IU] | Freq: Once | INTRAVENOUS | Status: AC | PRN
  Administered 2021-03-15: 500 [IU]

## 2021-03-15 MED ORDER — SODIUM CHLORIDE 0.9% FLUSH
10.0000 mL | INTRAVENOUS | Status: DC | PRN
  Administered 2021-03-15: 10 mL

## 2021-03-15 NOTE — Patient Instructions (Signed)
Louis [name]  Discharge Instructions: Thank you for choosing [facility] to provide your oncology and hematology care.   If you have a lab appointment with the [facility], please go directly to the [facility] and check in at the registration area.   Wear comfortable clothing and clothing appropriate for easy access to any Portacath or PICC line.   We strive to give you quality time with your provider. You may need to reschedule your appointment if you arrive late (15 or more minutes).  Arriving late affects you and other patients whose appointments are after yours.  Also, if you miss three or more appointments without notifying the office, you may be dismissed from the clinic at the provider's discretion.      For prescription refill requests, have your pharmacy contact our office and allow 72 hours for refills to be completed.    Today you received the following chemotherapy and/or immunotherapy agents: 69fu  Fluorouracil, 5-FU injection What is this medication? FLUOROURACIL, 5-FU (flure oh YOOR a sil) is a chemotherapy drug. It slows the growth of cancer cells. This medicine is used to treat many types of cancer like breast cancer, colon or rectal cancer, pancreatic cancer, and stomach cancer. This medicine may be used for other purposes; ask your health care provider or pharmacist if you have questions. COMMON BRAND NAME(S): Adrucil What should I tell my care team before I take this medication? They need to know if you have any of these conditions: blood disorders dihydropyrimidine dehydrogenase (DPD) deficiency infection (especially a virus infection such as chickenpox, cold sores, or herpes) kidney disease liver disease malnourished, poor nutrition recent or ongoing radiation therapy an unusual or allergic reaction to fluorouracil, other chemotherapy, other medicines, foods, dyes, or preservatives pregnant or trying to get  pregnant breast-feeding How should I use this medication? This drug is given as an infusion or injection into a vein. It is administered in a hospital or clinic by a specially trained health care professional. Talk to your pediatrician regarding the use of this medicine in children. Special care may be needed. Overdosage: If you think you have taken too much of this medicine contact a poison control center or emergency room at once. NOTE: This medicine is only for you. Do not share this medicine with others. What if I miss a dose? It is important not to miss your dose. Call your doctor or health care professional if you are unable to keep an appointment. What may interact with this medication? Do not take this medicine with any of the following medications: live virus vaccines This medicine may also interact with the following medications: medicines that treat or prevent blood clots like warfarin, enoxaparin, and dalteparin This list may not describe all possible interactions. Give your health care provider a list of all the medicines, herbs, non-prescription drugs, or dietary supplements you use. Also tell them if you smoke, drink alcohol, or use illegal drugs. Some items may interact with your medicine. What should I watch for while using this medication? Visit your doctor for checks on your progress. This drug may make you feel generally unwell. This is not uncommon, as chemotherapy can affect healthy cells as well as cancer cells. Report any side effects. Continue your course of treatment even though you feel ill unless your doctor tells you to stop. In some cases, you may be given additional medicines to help with side effects. Follow all directions for their use. Call your  doctor or health care professional for advice if you get a fever, chills or sore throat, or other symptoms of a cold or flu. Do not treat yourself. This drug decreases your body's ability to fight infections. Try to avoid  being around people who are sick. This medicine may increase your risk to bruise or bleed. Call your doctor or health care professional if you notice any unusual bleeding. Be careful brushing and flossing your teeth or using a toothpick because you may get an infection or bleed more easily. If you have any dental work done, tell your dentist you are receiving this medicine. Avoid taking products that contain aspirin, acetaminophen, ibuprofen, naproxen, or ketoprofen unless instructed by your doctor. These medicines may hide a fever. Do not become pregnant while taking this medicine. Women should inform their doctor if they wish to become pregnant or think they might be pregnant. There is a potential for serious side effects to an unborn child. Talk to your health care professional or pharmacist for more information. Do not breast-feed an infant while taking this medicine. Men should inform their doctor if they wish to father a child. This medicine may lower sperm counts. Do not treat diarrhea with over the counter products. Contact your doctor if you have diarrhea that lasts more than 2 days or if it is severe and watery. This medicine can make you more sensitive to the sun. Keep out of the sun. If you cannot avoid being in the sun, wear protective clothing and use sunscreen. Do not use sun lamps or tanning beds/booths. What side effects may I notice from receiving this medication? Side effects that you should report to your doctor or health care professional as soon as possible: allergic reactions like skin rash, itching or hives, swelling of the face, lips, or tongue low blood counts - this medicine may decrease the number of white blood cells, red blood cells and platelets. You may be at increased risk for infections and bleeding. signs of infection - fever or chills, cough, sore throat, pain or difficulty passing urine signs of decreased platelets or bleeding - bruising, pinpoint red spots on the  skin, black, tarry stools, blood in the urine signs of decreased red blood cells - unusually weak or tired, fainting spells, lightheadedness breathing problems changes in vision chest pain mouth sores nausea and vomiting pain, swelling, redness at site where injected pain, tingling, numbness in the hands or feet redness, swelling, or sores on hands or feet stomach pain unusual bleeding Side effects that usually do not require medical attention (report to your doctor or health care professional if they continue or are bothersome): changes in finger or toe nails diarrhea dry or itchy skin hair loss headache loss of appetite sensitivity of eyes to the light stomach upset unusually teary eyes This list may not describe all possible side effects. Call your doctor for medical advice about side effects. You may report side effects to FDA at 1-800-FDA-1088. Where should I keep my medication? This drug is given in a hospital or clinic and will not be stored at home. NOTE: This sheet is a summary. It may not cover all possible information. If you have questions about this medicine, talk to your doctor, pharmacist, or health care provider.  2022 Elsevier/Gold Standard (2021-01-08 00:00:00)       To help prevent nausea and vomiting after your treatment, we encourage you to take your nausea medication as directed.  BELOW ARE SYMPTOMS THAT SHOULD BE REPORTED IMMEDIATELY: *FEVER  GREATER THAN 100.4 F (38 C) OR HIGHER *CHILLS OR SWEATING *NAUSEA AND VOMITING THAT IS NOT CONTROLLED WITH YOUR NAUSEA MEDICATION *UNUSUAL SHORTNESS OF BREATH *UNUSUAL BRUISING OR BLEEDING *URINARY PROBLEMS (pain or burning when urinating, or frequent urination) *BOWEL PROBLEMS (unusual diarrhea, constipation, pain near the anus) TENDERNESS IN MOUTH AND THROAT WITH OR WITHOUT PRESENCE OF ULCERS (sore throat, sores in mouth, or a toothache) UNUSUAL RASH, SWELLING OR PAIN  UNUSUAL VAGINAL DISCHARGE OR ITCHING    Items with * indicate a potential emergency and should be followed up as soon as possible or go to the Emergency Department if any problems should occur.  Please show the CHEMOTHERAPY ALERT CARD or IMMUNOTHERAPY ALERT CARD at check-in to the Emergency Department and triage nurse.  Should you have questions after your visit or need to cancel or reschedule your appointment, please contact [facility]  Dept: [phone]  and follow the prompts.  Office hours are 8:00 a.m. to 4:30 p.m. Monday - Friday. Please note that voicemails left after 4:00 p.m. may not be returned until the following business day.  We are closed weekends and major holidays. You have access to a nurse at all times for urgent questions. Please call the main number to the clinic Dept: [phone] and follow the prompts.   For any non-urgent questions, you may also contact your provider using MyChart. We now offer e-Visits for anyone 65 and older to request care online for non-urgent symptoms. For details visit [email or web address].   Also download the MyChart app! Go to the app store, search "MyChart", open the app, select [facility], and log in with your MyChart username and password.  Due to Covid, a mask is required upon entering the hospital/clinic. If you do not have a mask, one will be given to you upon arrival. For doctor visits, patients may have 1 support person aged 31 or older with them. For treatment visits, patients cannot have anyone with them due to current Covid guidelines and our immunocompromised population.

## 2021-03-22 DIAGNOSIS — Z23 Encounter for immunization: Secondary | ICD-10-CM | POA: Diagnosis not present

## 2021-03-31 ENCOUNTER — Other Ambulatory Visit: Payer: Self-pay | Admitting: Oncology

## 2021-04-03 ENCOUNTER — Inpatient Hospital Stay: Payer: Medicare Other

## 2021-04-03 ENCOUNTER — Inpatient Hospital Stay (HOSPITAL_BASED_OUTPATIENT_CLINIC_OR_DEPARTMENT_OTHER): Payer: Medicare Other | Admitting: Nurse Practitioner

## 2021-04-03 ENCOUNTER — Other Ambulatory Visit: Payer: Self-pay

## 2021-04-03 ENCOUNTER — Encounter: Payer: Self-pay | Admitting: Nurse Practitioner

## 2021-04-03 VITALS — BP 155/76 | HR 62

## 2021-04-03 VITALS — BP 134/63 | HR 80 | Temp 98.1°F | Resp 20 | Ht 76.0 in | Wt 249.2 lb

## 2021-04-03 DIAGNOSIS — Z5111 Encounter for antineoplastic chemotherapy: Secondary | ICD-10-CM | POA: Diagnosis not present

## 2021-04-03 DIAGNOSIS — C189 Malignant neoplasm of colon, unspecified: Secondary | ICD-10-CM | POA: Diagnosis not present

## 2021-04-03 DIAGNOSIS — C774 Secondary and unspecified malignant neoplasm of inguinal and lower limb lymph nodes: Secondary | ICD-10-CM | POA: Diagnosis not present

## 2021-04-03 DIAGNOSIS — I129 Hypertensive chronic kidney disease with stage 1 through stage 4 chronic kidney disease, or unspecified chronic kidney disease: Secondary | ICD-10-CM | POA: Diagnosis not present

## 2021-04-03 DIAGNOSIS — N189 Chronic kidney disease, unspecified: Secondary | ICD-10-CM | POA: Diagnosis not present

## 2021-04-03 DIAGNOSIS — C187 Malignant neoplasm of sigmoid colon: Secondary | ICD-10-CM | POA: Diagnosis not present

## 2021-04-03 DIAGNOSIS — Z5112 Encounter for antineoplastic immunotherapy: Secondary | ICD-10-CM | POA: Diagnosis not present

## 2021-04-03 LAB — CBC WITH DIFFERENTIAL (CANCER CENTER ONLY)
Abs Immature Granulocytes: 0.05 10*3/uL (ref 0.00–0.07)
Basophils Absolute: 0 10*3/uL (ref 0.0–0.1)
Basophils Relative: 1 %
Eosinophils Absolute: 0.2 10*3/uL (ref 0.0–0.5)
Eosinophils Relative: 6 %
HCT: 31.1 % — ABNORMAL LOW (ref 39.0–52.0)
Hemoglobin: 9.5 g/dL — ABNORMAL LOW (ref 13.0–17.0)
Immature Granulocytes: 1 %
Lymphocytes Relative: 31 %
Lymphs Abs: 1.1 10*3/uL (ref 0.7–4.0)
MCH: 25.7 pg — ABNORMAL LOW (ref 26.0–34.0)
MCHC: 30.5 g/dL (ref 30.0–36.0)
MCV: 84.3 fL (ref 80.0–100.0)
Monocytes Absolute: 0.5 10*3/uL (ref 0.1–1.0)
Monocytes Relative: 12 %
Neutro Abs: 1.9 10*3/uL (ref 1.7–7.7)
Neutrophils Relative %: 49 %
Platelet Count: 175 10*3/uL (ref 150–400)
RBC: 3.69 MIL/uL — ABNORMAL LOW (ref 4.22–5.81)
RDW: 19.3 % — ABNORMAL HIGH (ref 11.5–15.5)
WBC Count: 3.7 10*3/uL — ABNORMAL LOW (ref 4.0–10.5)
nRBC: 0 % (ref 0.0–0.2)

## 2021-04-03 LAB — CMP (CANCER CENTER ONLY)
ALT: 13 U/L (ref 0–44)
AST: 12 U/L — ABNORMAL LOW (ref 15–41)
Albumin: 3.9 g/dL (ref 3.5–5.0)
Alkaline Phosphatase: 72 U/L (ref 38–126)
Anion gap: 9 (ref 5–15)
BUN: 30 mg/dL — ABNORMAL HIGH (ref 8–23)
CO2: 22 mmol/L (ref 22–32)
Calcium: 8.8 mg/dL — ABNORMAL LOW (ref 8.9–10.3)
Chloride: 108 mmol/L (ref 98–111)
Creatinine: 2.4 mg/dL — ABNORMAL HIGH (ref 0.61–1.24)
GFR, Estimated: 28 mL/min — ABNORMAL LOW (ref >60)
Glucose, Bld: 175 mg/dL — ABNORMAL HIGH (ref 70–99)
Potassium: 4.2 mmol/L (ref 3.5–5.1)
Sodium: 139 mmol/L (ref 135–145)
Total Bilirubin: 0.3 mg/dL (ref 0.3–1.2)
Total Protein: 6.4 g/dL — ABNORMAL LOW (ref 6.5–8.1)

## 2021-04-03 LAB — TOTAL PROTEIN, URINE DIPSTICK: Protein, ur: 30 mg/dL — AB

## 2021-04-03 MED ORDER — SODIUM CHLORIDE 0.9 % IV SOLN: Freq: Once | INTRAVENOUS | Status: AC

## 2021-04-03 MED ORDER — SODIUM CHLORIDE 0.9 % IV SOLN
5.0000 mg/kg | Freq: Once | INTRAVENOUS | Status: AC
  Administered 2021-04-03: 600 mg via INTRAVENOUS
  Filled 2021-04-03: qty 16

## 2021-04-03 MED ORDER — PALONOSETRON HCL INJECTION 0.25 MG/5ML
0.2500 mg | Freq: Once | INTRAVENOUS | Status: AC
  Administered 2021-04-03: 0.25 mg via INTRAVENOUS
  Filled 2021-04-03: qty 5

## 2021-04-03 MED ORDER — SODIUM CHLORIDE 0.9 % IV SOLN
Freq: Once | INTRAVENOUS | Status: AC
Start: 2021-04-03 — End: 2021-04-03

## 2021-04-03 MED ORDER — SODIUM CHLORIDE 0.9 % IV SOLN
10.0000 mg | Freq: Once | INTRAVENOUS | Status: AC
  Administered 2021-04-03: 10 mg via INTRAVENOUS
  Filled 2021-04-03: qty 1

## 2021-04-03 MED ORDER — SODIUM CHLORIDE 0.9 % IV SOLN
300.0000 mg/m2 | Freq: Once | INTRAVENOUS | Status: AC
  Administered 2021-04-03: 738 mg via INTRAVENOUS
  Filled 2021-04-03: qty 36.9

## 2021-04-03 MED ORDER — ATROPINE SULFATE 1 MG/ML IV SOLN
0.5000 mg | Freq: Once | INTRAVENOUS | Status: AC | PRN
  Administered 2021-04-03: 0.5 mg via INTRAVENOUS
  Filled 2021-04-03: qty 1

## 2021-04-03 MED ORDER — SODIUM CHLORIDE 0.9 % IV SOLN
145.0000 mg/m2 | Freq: Once | INTRAVENOUS | Status: AC
  Administered 2021-04-03: 360 mg via INTRAVENOUS
  Filled 2021-04-03: qty 15

## 2021-04-03 MED ORDER — FLUOROURACIL CHEMO INJECTION 2.5 GM/50ML
300.0000 mg/m2 | Freq: Once | INTRAVENOUS | Status: AC
  Administered 2021-04-03: 750 mg via INTRAVENOUS
  Filled 2021-04-03: qty 15

## 2021-04-03 MED ORDER — SODIUM CHLORIDE 0.9 % IV SOLN
1800.0000 mg/m2 | INTRAVENOUS | Status: DC
  Administered 2021-04-03: 4450 mg via INTRAVENOUS
  Filled 2021-04-03: qty 89

## 2021-04-03 NOTE — Progress Notes (Signed)
Patient presents for treatment. RN assessment completed along with the following:  Labs/vitals reviewed - Yes, and within treatment parameters.  Creatinine stable at 2.4 per Ned Card NP Weight within 10% of previous measurement - Yes Oncology Treatment Attestation completed for current therapy- Yes, on date 02/13/2021 Informed consent completed and reflects current therapy/intent - Yes, on date 02/26/2021             Provider progress note reviewed - Yes, today's provider note was reviewed. Treatment/Antibody/Supportive plan reviewed - Yes, and there are no adjustments needed for today's treatment. S&H and other orders reviewed - Yes, and there are no additional orders identified. Previous treatment date reviewed - Yes, and the appropriate amount of time has elapsed between treatments. Clinic Hand Off Received from - Ned Card NP to Tedd Sias RN  Patient to proceed with treatment.

## 2021-04-03 NOTE — Patient Instructions (Addendum)
[place] [facility] The chemotherapy medication bag should finish at 46 hours, 96 hours, or 7 days. For example, if your pump is scheduled for 46 hours and it was put on at 4:00 p.m., it should finish at 2:00 p.m. the day it is scheduled to come off regardless of your appointment time.     Estimated time to finish at 1:15 Friday, April 05, 2021.   If the display on your pump reads "Low Volume" and it is beeping, take the batteries out of the pump and come to the cancer center for it to be taken off.   If the pump alarms go off prior to the pump reading "Low Volume" then call [phone] and someone can assist you.  If the plunger comes out and the chemotherapy medication is leaking out, please use your home chemo spill kit to clean up the spill. Do NOT use paper towels or other household products.  If you have problems or questions regarding your pump, please call either [phone] (24 hours a day) or the cancer center Monday-Friday 8:00 a.m.- 4:30 p.m. at the clinic number and we will assist you. If you are unable to get assistance, then go to the nearest Emergency Department and ask the staff to contact the IV team for assistance.    Discharge Instructions: Thank you for choosing [facility] to provide your oncology and hematology care.   If you have a lab appointment with the [facility], please go directly to the [facility] and check in at the registration area.   Wear comfortable clothing and clothing appropriate for easy access to any Portacath or PICC line.   We strive to give you quality time with your provider. You may need to reschedule your appointment if you arrive late (15 or more minutes).  Arriving late affects you and other patients whose appointments are after yours.  Also, if you miss three or more appointments without notifying the office, you may be dismissed from the clinic at the provider's discretion.      For prescription  refill requests, have your pharmacy contact our office and allow 72 hours for refills to be completed.    Today you received the following chemotherapy and/or immunotherapy agents Irinotecan, leucovorin, fluorouracil      To help prevent nausea and vomiting after your treatment, we encourage you to take your nausea medication as directed.  BELOW ARE SYMPTOMS THAT SHOULD BE REPORTED IMMEDIATELY: *FEVER GREATER THAN 100.4 F (38 C) OR HIGHER *CHILLS OR SWEATING *NAUSEA AND VOMITING THAT IS NOT CONTROLLED WITH YOUR NAUSEA MEDICATION *UNUSUAL SHORTNESS OF BREATH *UNUSUAL BRUISING OR BLEEDING *URINARY PROBLEMS (pain or burning when urinating, or frequent urination) *BOWEL PROBLEMS (unusual diarrhea, constipation, pain near the anus) TENDERNESS IN MOUTH AND THROAT WITH OR WITHOUT PRESENCE OF ULCERS (sore throat, sores in mouth, or a toothache) UNUSUAL RASH, SWELLING OR PAIN  UNUSUAL VAGINAL DISCHARGE OR ITCHING   Items with * indicate a potential emergency and should be followed up as soon as possible or go to the Emergency Department if any problems should occur.  Please show the CHEMOTHERAPY ALERT CARD or IMMUNOTHERAPY ALERT CARD at check-in to the Emergency Department and triage nurse.  Should you have questions after your visit or need to cancel or reschedule your appointment, please contact [facility]  Dept: [phone]  and follow the prompts.  Office hours are 8:00 a.m. to 4:30 p.m. Monday - Friday. Please note that voicemails  left after 4:00 p.m. may not be returned until the following business day.  We are closed weekends and major holidays. You have access to a nurse at all times for urgent questions. Please call the main number to the clinic Dept: [phone] and follow the prompts.   For any non-urgent questions, you may also contact your provider using MyChart. We now offer e-Visits for anyone 59 and older to request care online for non-urgent symptoms.  For details visit [email or web address].   Also download the MyChart app! Go to the app store, search "MyChart", open the app, select [facility], and log in with your MyChart username and password.  Due to Covid, a mask is required upon entering the hospital/clinic. If you do not have a mask, one will be given to you upon arrival. For doctor visits, patients may have 1 support person aged 52 or older with them. For treatment visits, patients cannot have anyone with them due to current Covid guidelines and our immunocompromised population.   Irinotecan injection What is this medication? IRINOTECAN (ir in oh TEE kan ) is a chemotherapy drug. It is used to treat colon and rectal cancer. This medicine may be used for other purposes; ask your health care provider or pharmacist if you have questions. COMMON BRAND NAME(S): Camptosar What should I tell my care team before I take this medication? They need to know if you have any of these conditions: dehydration diarrhea infection (especially a virus infection such as chickenpox, cold sores, or herpes) liver disease low blood counts, like low white cell, platelet, or red cell counts low levels of calcium, magnesium, or potassium in the blood recent or ongoing radiation therapy an unusual or allergic reaction to irinotecan, other medicines, foods, dyes, or preservatives pregnant or trying to get pregnant breast-feeding How should I use this medication? This drug is given as an infusion into a vein. It is administered in a hospital or clinic by a specially trained health care professional. Talk to your pediatrician regarding the use of this medicine in children. Special care may be needed. Overdosage: If you think you have taken too much of this medicine contact a poison control center or emergency room at once. NOTE: This medicine is only for you. Do not share this medicine with others. What if I miss a dose? It is important not to miss your  dose. Call your doctor or health care professional if you are unable to keep an appointment. What may interact with this medication? Do not take this medicine with any of the following medications: cobicistat itraconazole This medicine may interact with the following medications: antiviral medicines for HIV or AIDS certain antibiotics like rifampin or rifabutin certain medicines for fungal infections like ketoconazole, posaconazole, and voriconazole certain medicines for seizures like carbamazepine, phenobarbital, phenotoin clarithromycin gemfibrozil nefazodone [facility]. John's Wort This list may not describe all possible interactions. Give your health care provider a list of all the medicines, herbs, non-prescription drugs, or dietary supplements you use. Also tell them if you smoke, drink alcohol, or use illegal drugs. Some items may interact with your medicine. What should I watch for while using this medication? Your condition will be monitored carefully while you are receiving this medicine. You will need important blood work done while you are taking this medicine. This drug may make you feel generally unwell. This is not uncommon, as chemotherapy can affect healthy cells as well as cancer cells. Report any side effects. Continue your course of treatment even though you  feel ill unless your doctor tells you to stop. In some cases, you may be given additional medicines to help with side effects. Follow all directions for their use. You may get drowsy or dizzy. Do not drive, use machinery, or do anything that needs mental alertness until you know how this medicine affects you. Do not stand or sit up quickly, especially if you are an older patient. This reduces the risk of dizzy or fainting spells. Call your health care professional for advice if you get a fever, chills, or sore throat, or other symptoms of a cold or flu. Do not treat yourself. This medicine decreases your body's ability to fight  infections. Try to avoid being around people who are sick. Avoid taking products that contain aspirin, acetaminophen, ibuprofen, naproxen, or ketoprofen unless instructed by your doctor. These medicines may hide a fever. This medicine may increase your risk to bruise or bleed. Call your doctor or health care professional if you notice any unusual bleeding. Be careful brushing and flossing your teeth or using a toothpick because you may get an infection or bleed more easily. If you have any dental work done, tell your dentist you are receiving this medicine. Do not become pregnant while taking this medicine or for 6 months after stopping it. Women should inform their health care professional if they wish to become pregnant or think they might be pregnant. Men should not father a child while taking this medicine and for 3 months after stopping it. There is potential for serious side effects to an unborn child. Talk to your health care professional for more information. Do not breast-feed an infant while taking this medicine or for 7 days after stopping it. This medicine has caused ovarian failure in some women. This medicine may make it more difficult to get pregnant. Talk to your health care professional if you are concerned about your fertility. This medicine has caused decreased sperm counts in some men. This may make it more difficult to father a child. Talk to your health care professional if you are concerned about your fertility. What side effects may I notice from receiving this medication? Side effects that you should report to your doctor or health care professional as soon as possible: allergic reactions like skin rash, itching or hives, swelling of the face, lips, or tongue chest pain diarrhea flushing, runny nose, sweating during infusion low blood counts - this medicine may decrease the number of white blood cells, red blood cells and platelets. You may be at increased risk for infections  and bleeding. nausea, vomiting pain, swelling, warmth in the leg signs of decreased platelets or bleeding - bruising, pinpoint red spots on the skin, black, tarry stools, blood in the urine signs of infection - fever or chills, cough, sore throat, pain or difficulty passing urine signs of decreased red blood cells - unusually weak or tired, fainting spells, lightheadedness Side effects that usually do not require medical attention (report to your doctor or health care professional if they continue or are bothersome): constipation hair loss headache loss of appetite mouth sores stomach pain This list may not describe all possible side effects. Call your doctor for medical advice about side effects. You may report side effects to FDA at 1-800-FDA-1088. Where should I keep my medication? This drug is given in a hospital or clinic and will not be stored at home. NOTE: This sheet is a summary. It may not cover all possible information. If you have questions about this medicine,  talk to your doctor, pharmacist, or health care provider.  2022 Elsevier/Gold Standard (2021-01-08 00:00:00)  Leucovorin injection What is this medication? LEUCOVORIN (loo koe VOR in) is used to prevent or treat the harmful effects of some medicines. This medicine is used to treat anemia caused by a low amount of folic acid in the body. It is also used with 5-fluorouracil (5-FU) to treat colon cancer. This medicine may be used for other purposes; ask your health care provider or pharmacist if you have questions. What should I tell my care team before I take this medication? They need to know if you have any of these conditions: anemia from low levels of vitamin B-12 in the blood an unusual or allergic reaction to leucovorin, folic acid, other medicines, foods, dyes, or preservatives pregnant or trying to get pregnant breast-feeding How should I use this medication? This medicine is for injection into a muscle or into  a vein. It is given by a health care professional in a hospital or clinic setting. Talk to your pediatrician regarding the use of this medicine in children. Special care may be needed. Overdosage: If you think you have taken too much of this medicine contact a poison control center or emergency room at once. NOTE: This medicine is only for you. Do not share this medicine with others. What if I miss a dose? This does not apply. What may interact with this medication? capecitabine fluorouracil phenobarbital phenytoin primidone trimethoprim-sulfamethoxazole This list may not describe all possible interactions. Give your health care provider a list of all the medicines, herbs, non-prescription drugs, or dietary supplements you use. Also tell them if you smoke, drink alcohol, or use illegal drugs. Some items may interact with your medicine. What should I watch for while using this medication? Your condition will be monitored carefully while you are receiving this medicine. This medicine may increase the side effects of 5-fluorouracil, 5-FU. Tell your doctor or health care professional if you have diarrhea or mouth sores that do not get better or that get worse. What side effects may I notice from receiving this medication? Side effects that you should report to your doctor or health care professional as soon as possible: allergic reactions like skin rash, itching or hives, swelling of the face, lips, or tongue breathing problems fever, infection mouth sores unusual bleeding or bruising unusually weak or tired Side effects that usually do not require medical attention (report to your doctor or health care professional if they continue or are bothersome): constipation or diarrhea loss of appetite nausea, vomiting This list may not describe all possible side effects. Call your doctor for medical advice about side effects. You may report side effects to FDA at 1-800-FDA-1088. Where should I keep  my medication? This drug is given in a hospital or clinic and will not be stored at home. NOTE: This sheet is a summary. It may not cover all possible information. If you have questions about this medicine, talk to your doctor, pharmacist, or health care provider.  2022 Elsevier/Gold Standard (2007-10-28 00:00:00)  Fluorouracil, 5-FU injection What is this medication? FLUOROURACIL, 5-FU (flure oh YOOR a sil) is a chemotherapy drug. It slows the growth of cancer cells. This medicine is used to treat many types of cancer like breast cancer, colon or rectal cancer, pancreatic cancer, and stomach cancer. This medicine may be used for other purposes; ask your health care provider or pharmacist if you have questions. COMMON BRAND NAME(S): Adrucil What should I tell my care team  before I take this medication? They need to know if you have any of these conditions: blood disorders dihydropyrimidine dehydrogenase (DPD) deficiency infection (especially a virus infection such as chickenpox, cold sores, or herpes) kidney disease liver disease malnourished, poor nutrition recent or ongoing radiation therapy an unusual or allergic reaction to fluorouracil, other chemotherapy, other medicines, foods, dyes, or preservatives pregnant or trying to get pregnant breast-feeding How should I use this medication? This drug is given as an infusion or injection into a vein. It is administered in a hospital or clinic by a specially trained health care professional. Talk to your pediatrician regarding the use of this medicine in children. Special care may be needed. Overdosage: If you think you have taken too much of this medicine contact a poison control center or emergency room at once. NOTE: This medicine is only for you. Do not share this medicine with others. What if I miss a dose? It is important not to miss your dose. Call your doctor or health care professional if you are unable to keep an  appointment. What may interact with this medication? Do not take this medicine with any of the following medications: live virus vaccines This medicine may also interact with the following medications: medicines that treat or prevent blood clots like warfarin, enoxaparin, and dalteparin This list may not describe all possible interactions. Give your health care provider a list of all the medicines, herbs, non-prescription drugs, or dietary supplements you use. Also tell them if you smoke, drink alcohol, or use illegal drugs. Some items may interact with your medicine. What should I watch for while using this medication? Visit your doctor for checks on your progress. This drug may make you feel generally unwell. This is not uncommon, as chemotherapy can affect healthy cells as well as cancer cells. Report any side effects. Continue your course of treatment even though you feel ill unless your doctor tells you to stop. In some cases, you may be given additional medicines to help with side effects. Follow all directions for their use. Call your doctor or health care professional for advice if you get a fever, chills or sore throat, or other symptoms of a cold or flu. Do not treat yourself. This drug decreases your body's ability to fight infections. Try to avoid being around people who are sick. This medicine may increase your risk to bruise or bleed. Call your doctor or health care professional if you notice any unusual bleeding. Be careful brushing and flossing your teeth or using a toothpick because you may get an infection or bleed more easily. If you have any dental work done, tell your dentist you are receiving this medicine. Avoid taking products that contain aspirin, acetaminophen, ibuprofen, naproxen, or ketoprofen unless instructed by your doctor. These medicines may hide a fever. Do not become pregnant while taking this medicine. Women should inform their doctor if they wish to become pregnant  or think they might be pregnant. There is a potential for serious side effects to an unborn child. Talk to your health care professional or pharmacist for more information. Do not breast-feed an infant while taking this medicine. Men should inform their doctor if they wish to father a child. This medicine may lower sperm counts. Do not treat diarrhea with over the counter products. Contact your doctor if you have diarrhea that lasts more than 2 days or if it is severe and watery. This medicine can make you more sensitive to the sun. Keep out of the  sun. If you cannot avoid being in the sun, wear protective clothing and use sunscreen. Do not use sun lamps or tanning beds/booths. What side effects may I notice from receiving this medication? Side effects that you should report to your doctor or health care professional as soon as possible: allergic reactions like skin rash, itching or hives, swelling of the face, lips, or tongue low blood counts - this medicine may decrease the number of white blood cells, red blood cells and platelets. You may be at increased risk for infections and bleeding. signs of infection - fever or chills, cough, sore throat, pain or difficulty passing urine signs of decreased platelets or bleeding - bruising, pinpoint red spots on the skin, black, tarry stools, blood in the urine signs of decreased red blood cells - unusually weak or tired, fainting spells, lightheadedness breathing problems changes in vision chest pain mouth sores nausea and vomiting pain, swelling, redness at site where injected pain, tingling, numbness in the hands or feet redness, swelling, or sores on hands or feet stomach pain unusual bleeding Side effects that usually do not require medical attention (report to your doctor or health care professional if they continue or are bothersome): changes in finger or toe nails diarrhea dry or itchy skin hair loss headache loss of appetite sensitivity  of eyes to the light stomach upset unusually teary eyes This list may not describe all possible side effects. Call your doctor for medical advice about side effects. You may report side effects to FDA at 1-800-FDA-1088. Where should I keep my medication? This drug is given in a hospital or clinic and will not be stored at home. NOTE: This sheet is a summary. It may not cover all possible information. If you have questions about this medicine, talk to your doctor, pharmacist, or health care provider.  2022 [employer]vier/Gold Standard (2021-01-08 00:00:00)

## 2021-04-03 NOTE — Progress Notes (Signed)
[facility] [place] OFFICE PROGRESS NOTE   Diagnosis: Colon cancer  INTERVAL HISTORY:   Louis [name] returns as scheduled.  He completed cycle 2 FOLFIRI/Avastin 03/13/2021.  He had mild nausea.  No mouth sores.  He developed diarrhea on day 1 or 2.  The diarrhea lasted a couple of days, effectively relieved with Imodium.  He denies bleeding.  He notes more hair loss.  Objective:  Vital signs in last 24 hours:  Blood pressure 134/63, pulse 80, temperature 98.1 F (36.7 C), temperature source Oral, resp. rate 20, height 6' 4" (1.93 m), weight 249 lb 3.2 oz (113 kg), SpO2 99 %.    HEENT: No thrush or ulcers. Lymphatics: Approximate 2 cm mobile left axillary lymph node, approximate 4 cm left inguinal node. Resp: Lungs clear bilaterally. Cardio: Regular rate and rhythm. GI: No hepatosplenomegaly. Vascular: No leg edema. Port-A-Cath without erythema.  Lab Results:  Lab Results  Component Value Date   WBC 3.7 (L) 04/03/2021   HGB 9.5 (L) 04/03/2021   HCT 31.1 (L) 04/03/2021   MCV 84.3 04/03/2021   PLT 175 04/03/2021   NEUTROABS 1.9 04/03/2021    Imaging:  No results found.  Medications: I have reviewed the patient's current medications.  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), isolated mesenteric implant-resected, multiple tumor deposits Sigmoid/descending resection and creation of a descending colostomy 04/10/2016 MSI-stable, no loss of mismatch repair protein expression Foundation 1- BRAF V600E positive, MS-stable, intermediate tumor mutation burden, no RAS mutation CT abdomen/pelvis 04/02/2016-no evidence of distant metastatic disease Cycle 1 FOLFOX 05/21/2016 Cycle 2 FOLFOX 06/04/2016 Cycle 3 FOLFOX 06/18/2016 Cycle 4 FOLFOX 07/02/2016 (oxaliplatin held secondary to thrombocytopenia) Cycle 5 FOLFOX 07/16/2016 Cycle 6 FOLFOX 07/30/2016 Cycle 7 FOLFOX 08/13/2016 (oxaliplatin held and 5-FU dose reduced) Cycle 8 FOLFOX 09/03/2016  (oxaliplatin held secondary to neuropathy) Cycle 9 FOLFOX 09/17/2016 (oxaliplatin held secondary to neuropathy) Cycle 10 FOLFOX 10/02/2016 Cycle 11 FOLFOX 10/15/2016 (oxaliplatin eliminated from the regimen) Cycle 12 FOLFOX 10/30/2016 (oxaliplatin eliminated) CTs 05/12/2017-no evidence of recurrent disease, right inguinal hernia containing bladder Colonoscopy 07/21/2017, 3 polyps were removed from the descending and transverse colon, fragments of tubular and tubulovillous adenoma CTs 05/19/2018-no evidence of recurrent disease, status post hernia repair, right upper lobe pneumonia CTs 05/20/2019-resolution of right upper lobe pneumonia, no evidence of metastatic disease Colonoscopy 01/25/2020-multiple polyps removed-tubular adenomas, poor preparation, repeat colonoscopy recommended CTs neck, chest, and abdomen/pelvis 08/27/2020- new 2 centimeter left axillary node, new 1.9 cm left inguinal node chronic mildly prominent portacaval node Ultrasound biopsy of left inguinal node on 10/29/2020-metastatic adenocarcinoma consistent with colon adenocarcinoma PET [id number] hypermetabolic left inguinal and left axillary nodes, solitary focus of hypermetabolic activity in the right lower quadrant small bowel Cycle 1 FOLFOX 12/19/2020 Cycle 2 FOLFOX 01/02/2021, oxaliplatin dose reduced secondary to neutropenia and thrombocytopenia Cycle 3 FOLFOX 01/16/2021 Cycle 4 FOLFOX 01/30/2021 CTs 02/11/2021-no change in left inguinal and left axillary nodes, no evidence of disease progression, stable wall thickening involving a loop of distal ileum Cycle 1 FOLFIRI/Avastin 02/27/2021 Cycle 2 FOLFIRI/Avastin 03/13/2021 Cycle 3 FOLFIRI/Avastin 04/03/2021   2.   Chronic renal insufficiency   3.   Hypertension   4.  Inflamed sebaceous cyst at the upper back-status post incision and drainage   5.  Thrombocytopenia secondary chemotherapy-oxaliplatin dose reduced beginning with cycle 3 FOLFOX, oxaliplatin held with cycle 4  and cycle 7 FOLFOX   6.  Oxaliplatin neuropathy-improved   7.  History of Mucositis secondary chemotherapy   8.  Symptoms of pneumonia January 2020-CT  05/19/2018 consistent with right upper lobe pneumonia, Levaquin prescribed 05/20/2018   9.  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  He has completed 2 cycles of FOLFIRI/Avastin.  Overall he seems to be tolerating well.  He had some diarrhea following cycle 2, effectively relieved with Imodium.  Plan to proceed with cycle 3 FOLFIRI/Avastin today as scheduled.  He will contact the office with poorly controlled diarrhea.  CBC and chemistry panel reviewed.  Labs adequate to proceed with treatment.  Stable elevated creatinine.  He will return for lab, follow-up, cycle 4 FOLFIRI/Avastin in 2 weeks.  He will contact the office in the interim as outlined above or with any other problems.    Ned Card ANP/GNP-BC   04/03/2021  9:56 AM

## 2021-04-05 ENCOUNTER — Other Ambulatory Visit: Payer: Self-pay

## 2021-04-05 ENCOUNTER — Inpatient Hospital Stay: Payer: Medicare Other | Attending: Oncology

## 2021-04-05 VITALS — BP 152/76 | HR 56 | Temp 98.0°F | Resp 20

## 2021-04-05 DIAGNOSIS — Z5111 Encounter for antineoplastic chemotherapy: Secondary | ICD-10-CM | POA: Insufficient documentation

## 2021-04-05 DIAGNOSIS — I10 Essential (primary) hypertension: Secondary | ICD-10-CM | POA: Diagnosis not present

## 2021-04-05 DIAGNOSIS — C189 Malignant neoplasm of colon, unspecified: Secondary | ICD-10-CM

## 2021-04-05 DIAGNOSIS — D6959 Other secondary thrombocytopenia: Secondary | ICD-10-CM | POA: Insufficient documentation

## 2021-04-05 DIAGNOSIS — Z79899 Other long term (current) drug therapy: Secondary | ICD-10-CM | POA: Insufficient documentation

## 2021-04-05 DIAGNOSIS — N189 Chronic kidney disease, unspecified: Secondary | ICD-10-CM | POA: Insufficient documentation

## 2021-04-05 DIAGNOSIS — C187 Malignant neoplasm of sigmoid colon: Secondary | ICD-10-CM | POA: Diagnosis not present

## 2021-04-05 DIAGNOSIS — T451X5A Adverse effect of antineoplastic and immunosuppressive drugs, initial encounter: Secondary | ICD-10-CM | POA: Insufficient documentation

## 2021-04-05 MED ORDER — HEPARIN SOD (PORK) LOCK FLUSH 100 UNIT/ML IV SOLN
500.0000 [IU] | Freq: Once | INTRAVENOUS | Status: AC | PRN
  Administered 2021-04-05: 500 [IU]

## 2021-04-05 MED ORDER — SODIUM CHLORIDE 0.9% FLUSH
10.0000 mL | INTRAVENOUS | Status: DC | PRN
  Administered 2021-04-05: 10 mL

## 2021-04-05 NOTE — Patient Instructions (Signed)

## 2021-04-13 ENCOUNTER — Other Ambulatory Visit: Payer: Self-pay | Admitting: Oncology

## 2021-04-17 ENCOUNTER — Inpatient Hospital Stay: Payer: Medicare Other

## 2021-04-17 ENCOUNTER — Other Ambulatory Visit: Payer: Self-pay

## 2021-04-17 ENCOUNTER — Encounter: Payer: Self-pay | Admitting: Oncology

## 2021-04-17 ENCOUNTER — Inpatient Hospital Stay (HOSPITAL_BASED_OUTPATIENT_CLINIC_OR_DEPARTMENT_OTHER): Payer: Medicare Other | Admitting: Oncology

## 2021-04-17 VITALS — BP 140/72 | HR 71 | Temp 98.1°F | Resp 18 | Ht 76.0 in | Wt 246.0 lb

## 2021-04-17 VITALS — BP 147/78 | HR 59 | Temp 98.1°F | Resp 18

## 2021-04-17 DIAGNOSIS — D6959 Other secondary thrombocytopenia: Secondary | ICD-10-CM | POA: Diagnosis not present

## 2021-04-17 DIAGNOSIS — C187 Malignant neoplasm of sigmoid colon: Secondary | ICD-10-CM | POA: Diagnosis not present

## 2021-04-17 DIAGNOSIS — C189 Malignant neoplasm of colon, unspecified: Secondary | ICD-10-CM

## 2021-04-17 DIAGNOSIS — I10 Essential (primary) hypertension: Secondary | ICD-10-CM | POA: Diagnosis not present

## 2021-04-17 DIAGNOSIS — T451X5A Adverse effect of antineoplastic and immunosuppressive drugs, initial encounter: Secondary | ICD-10-CM | POA: Diagnosis not present

## 2021-04-17 DIAGNOSIS — N189 Chronic kidney disease, unspecified: Secondary | ICD-10-CM | POA: Diagnosis not present

## 2021-04-17 DIAGNOSIS — Z5111 Encounter for antineoplastic chemotherapy: Secondary | ICD-10-CM | POA: Diagnosis not present

## 2021-04-17 LAB — CMP (CANCER CENTER ONLY)
ALT: 9 U/L (ref 0–44)
AST: 10 U/L — ABNORMAL LOW (ref 15–41)
Albumin: 4 g/dL (ref 3.5–5.0)
Alkaline Phosphatase: 69 U/L (ref 38–126)
Anion gap: 7 (ref 5–15)
BUN: 29 mg/dL — ABNORMAL HIGH (ref 8–23)
CO2: 23 mmol/L (ref 22–32)
Calcium: 8.8 mg/dL — ABNORMAL LOW (ref 8.9–10.3)
Chloride: 108 mmol/L (ref 98–111)
Creatinine: 2.4 mg/dL — ABNORMAL HIGH (ref 0.61–1.24)
GFR, Estimated: 28 mL/min — ABNORMAL LOW (ref >60)
Glucose, Bld: 157 mg/dL — ABNORMAL HIGH (ref 70–99)
Potassium: 4.4 mmol/L (ref 3.5–5.1)
Sodium: 138 mmol/L (ref 135–145)
Total Bilirubin: 0.4 mg/dL (ref 0.3–1.2)
Total Protein: 6.8 g/dL (ref 6.5–8.1)

## 2021-04-17 LAB — CBC WITH DIFFERENTIAL (CANCER CENTER ONLY)
Abs Immature Granulocytes: 0.02 10*3/uL (ref 0.00–0.07)
Basophils Absolute: 0 10*3/uL (ref 0.0–0.1)
Basophils Relative: 1 %
Eosinophils Absolute: 0.3 10*3/uL (ref 0.0–0.5)
Eosinophils Relative: 7 %
HCT: 31.6 % — ABNORMAL LOW (ref 39.0–52.0)
Hemoglobin: 9.7 g/dL — ABNORMAL LOW (ref 13.0–17.0)
Immature Granulocytes: 1 %
Lymphocytes Relative: 30 %
Lymphs Abs: 1.2 10*3/uL (ref 0.7–4.0)
MCH: 25.7 pg — ABNORMAL LOW (ref 26.0–34.0)
MCHC: 30.7 g/dL (ref 30.0–36.0)
MCV: 83.6 fL (ref 80.0–100.0)
Monocytes Absolute: 0.5 10*3/uL (ref 0.1–1.0)
Monocytes Relative: 13 %
Neutro Abs: 1.8 10*3/uL (ref 1.7–7.7)
Neutrophils Relative %: 48 %
Platelet Count: 128 10*3/uL — ABNORMAL LOW (ref 150–400)
RBC: 3.78 MIL/uL — ABNORMAL LOW (ref 4.22–5.81)
RDW: 19.5 % — ABNORMAL HIGH (ref 11.5–15.5)
WBC Count: 3.8 10*3/uL — ABNORMAL LOW (ref 4.0–10.5)
nRBC: 0 % (ref 0.0–0.2)

## 2021-04-17 LAB — TOTAL PROTEIN, URINE DIPSTICK: Protein, ur: 100 mg/dL — AB

## 2021-04-17 MED ORDER — SODIUM CHLORIDE 0.9 % IV SOLN
145.0000 mg/m2 | Freq: Once | INTRAVENOUS | Status: AC
  Administered 2021-04-17: 12:00:00 360 mg via INTRAVENOUS
  Filled 2021-04-17: qty 15

## 2021-04-17 MED ORDER — SODIUM CHLORIDE 0.9 % IV SOLN: 5.0000 mg/kg | Freq: Once | INTRAVENOUS | Status: DC

## 2021-04-17 MED ORDER — SODIUM CHLORIDE 0.9 % IV SOLN
300.0000 mg/m2 | Freq: Once | INTRAVENOUS | Status: AC
  Administered 2021-04-17: 12:00:00 738 mg via INTRAVENOUS
  Filled 2021-04-17: qty 36.9

## 2021-04-17 MED ORDER — SODIUM CHLORIDE 0.9 % IV SOLN
150.0000 mg | Freq: Once | INTRAVENOUS | Status: AC
  Administered 2021-04-17: 10:00:00 150 mg via INTRAVENOUS
  Filled 2021-04-17: qty 5

## 2021-04-17 MED ORDER — FLUOROURACIL CHEMO INJECTION 2.5 GM/50ML
300.0000 mg/m2 | Freq: Once | INTRAVENOUS | Status: AC
  Administered 2021-04-17: 13:00:00 750 mg via INTRAVENOUS
  Filled 2021-04-17: qty 15

## 2021-04-17 MED ORDER — ATROPINE SULFATE 1 MG/ML IV SOLN
0.5000 mg | Freq: Once | INTRAVENOUS | Status: AC | PRN
  Administered 2021-04-17: 11:00:00 0.5 mg via INTRAVENOUS
  Filled 2021-04-17: qty 1

## 2021-04-17 MED ORDER — SODIUM CHLORIDE 0.9 % IV SOLN
1800.0000 mg/m2 | INTRAVENOUS | Status: DC
  Administered 2021-04-17: 14:00:00 4450 mg via INTRAVENOUS
  Filled 2021-04-17: qty 89

## 2021-04-17 MED ORDER — SODIUM CHLORIDE 0.9 % IV SOLN
10.0000 mg | Freq: Once | INTRAVENOUS | Status: AC
  Administered 2021-04-17: 10:00:00 10 mg via INTRAVENOUS
  Filled 2021-04-17: qty 1

## 2021-04-17 MED ORDER — SODIUM CHLORIDE 0.9 % IV SOLN
Freq: Once | INTRAVENOUS | Status: AC
Start: 2021-04-17 — End: 2021-04-17

## 2021-04-17 MED ORDER — PALONOSETRON HCL INJECTION 0.25 MG/5ML
0.2500 mg | Freq: Once | INTRAVENOUS | Status: AC
  Administered 2021-04-17: 10:00:00 0.25 mg via INTRAVENOUS
  Filled 2021-04-17: qty 5

## 2021-04-17 NOTE — Patient Instructions (Signed)

## 2021-04-17 NOTE — Progress Notes (Signed)
Patient presents for treatment. RN assessment completed along with the following:  Labs/vitals reviewed - Yes, and per Dr. Benay Spice, ok to treat with Scr 2.4 . Ok to proceed with premeds pending urine protein.  Weight within 10% of previous measurement - Yes Oncology Treatment Attestation completed for current therapy- Yes, on date 02/13/21 Informed consent completed and reflects current therapy/intent - Yes, on date 02/27/21             Provider progress note reviewed - Today's provider note is not yet available. I reviewed the most recent oncology provider progress note in chart dated 04/03/21. Treatment/Antibody/Supportive plan reviewed - Yes, and Emend to be added to premedications.  S&H and other orders reviewed - Yes, and there are no additional orders identified. Previous treatment date reviewed - Yes, and the appropriate amount of time has elapsed between treatments. Clinic Hand Off Received from - Lenox Ponds, LPN.   Patient to proceed with treatment.

## 2021-04-17 NOTE — Progress Notes (Signed)
Urine protein resulted and is 100mg /dL. Dr. Benay Spice notified. Per Dr. Benay Spice, no bevacizumab today.

## 2021-04-17 NOTE — Patient Instructions (Addendum)
[facility] The chemotherapy medication bag should finish at 46 hours, 96 hours, or 7 days. For example, if your pump is scheduled for 46 hours and it was put on at 4:00 p.m., it should finish at 2:00 p.m. the day it is scheduled to come off regardless of your appointment time.     Estimated time to finish at 11:00am Friday, April 19, 2021.   If the display on your pump reads "Low Volume" and it is beeping, take the batteries out of the pump and come to the cancer center for it to be taken off.   If the pump alarms go off prior to the pump reading "Low Volume" then call [phone] and someone can assist you.  If the plunger comes out and the chemotherapy medication is leaking out, please use your home chemo spill kit to clean up the spill. Do NOT use paper towels or other household products.  If you have problems or questions regarding your pump, please call either [phone] (24 hours a day) or the cancer center Monday-Friday 8:00 a.m.- 4:30 p.m. at the clinic number and we will assist you. If you are unable to get assistance, then go to the nearest Emergency Department and ask the staff to contact the IV team for assistance.    Discharge Instructions: Thank you for choosing [facility] to provide your oncology and hematology care.   If you have a lab appointment with the [facility], please go directly to the [facility] and check in at the registration area.   Wear comfortable clothing and clothing appropriate for easy access to any Portacath or PICC line.   We strive to give you quality time with your provider. You may need to reschedule your appointment if you arrive late (15 or more minutes).  Arriving late affects you and other patients whose appointments are after yours.  Also, if you miss three or more appointments without notifying the office, you may be dismissed from the clinic at the providers discretion.      For  prescription refill requests, have your pharmacy contact our office and allow 72 hours for refills to be completed.    Today you received the following chemotherapy and/or immunotherapy agents Irinotecan, leucovorin, fluorouracil      To help prevent nausea and vomiting after your treatment, we encourage you to take your nausea medication as directed.  BELOW ARE SYMPTOMS THAT SHOULD BE REPORTED IMMEDIATELY: *FEVER GREATER THAN 100.4 F (38 C) OR HIGHER *CHILLS OR SWEATING *NAUSEA AND VOMITING THAT IS NOT CONTROLLED WITH YOUR NAUSEA MEDICATION *UNUSUAL SHORTNESS OF BREATH *UNUSUAL BRUISING OR BLEEDING *URINARY PROBLEMS (pain or burning when urinating, or frequent urination) *BOWEL PROBLEMS (unusual diarrhea, constipation, pain near the anus) TENDERNESS IN MOUTH AND THROAT WITH OR WITHOUT PRESENCE OF ULCERS (sore throat, sores in mouth, or a toothache) UNUSUAL RASH, SWELLING OR PAIN  UNUSUAL VAGINAL DISCHARGE OR ITCHING   Items with * indicate a potential emergency and should be followed up as soon as possible or go to the Emergency Department if any problems should occur.  Please show the CHEMOTHERAPY ALERT CARD or IMMUNOTHERAPY ALERT CARD at check-in to the Emergency Department and triage nurse.  Should you have questions after your visit or need to cancel or reschedule your appointment, please contact [facility]  Dept: [phone]  and follow the prompts.  Office hours are 8:00 a.m. to 4:30 p.m. Monday - Friday. Please note that voicemails  left after 4:00 p.m. may not be returned until the following business day.  We are closed weekends and major holidays. You have access to a nurse at all times for urgent questions. Please call the main number to the clinic Dept: [phone] and follow the prompts.   For any non-urgent questions, you may also contact your provider using MyChart. We now offer e-Visits for anyone 32 and older to request care online for  non-urgent symptoms. For details visit [email or web address].   Also download the MyChart app! Go to the app store, search "MyChart", open the app, select [facility], and log in with your MyChart username and password.  Due to Covid, a mask is required upon entering the hospital/clinic. If you do not have a mask, one will be given to you upon arrival. For doctor visits, patients may have 1 support person aged 48 or older with them. For treatment visits, patients cannot have anyone with them due to current Covid guidelines and our immunocompromised population.   Irinotecan injection What is this medication? IRINOTECAN (ir in oh TEE kan ) is a chemotherapy drug. It is used to treat colon and rectal cancer. This medicine may be used for other purposes; ask your health care provider or pharmacist if you have questions. COMMON BRAND NAME(S): Camptosar What should I tell my care team before I take this medication? They need to know if you have any of these conditions: dehydration diarrhea infection (especially a virus infection such as chickenpox, cold sores, or herpes) liver disease low blood counts, like low white cell, platelet, or red cell counts low levels of calcium, magnesium, or potassium in the blood recent or ongoing radiation therapy an unusual or allergic reaction to irinotecan, other medicines, foods, dyes, or preservatives pregnant or trying to get pregnant breast-feeding How should I use this medication? This drug is given as an infusion into a vein. It is administered in a hospital or clinic by a specially trained health care professional. Talk to your pediatrician regarding the use of this medicine in children. Special care may be needed. Overdosage: If you think you have taken too much of this medicine contact a poison control center or emergency room at once. NOTE: This medicine is only for you. Do not share this medicine with others. What if I miss a dose? It is important  not to miss your dose. Call your doctor or health care professional if you are unable to keep an appointment. What may interact with this medication? Do not take this medicine with any of the following medications: cobicistat itraconazole This medicine may interact with the following medications: antiviral medicines for HIV or AIDS certain antibiotics like rifampin or rifabutin certain medicines for fungal infections like ketoconazole, posaconazole, and voriconazole certain medicines for seizures like carbamazepine, phenobarbital, phenotoin clarithromycin gemfibrozil nefazodone St. John's Wort This list may not describe all possible interactions. Give your health care provider a list of all the medicines, herbs, non-prescription drugs, or dietary supplements you use. Also tell them if you smoke, drink alcohol, or use illegal drugs. Some items may interact with your medicine. What should I watch for while using this medication? Your condition will be monitored carefully while you are receiving this medicine. You will need important blood work done while you are taking this medicine. This drug may make you feel generally unwell. This is not uncommon, as chemotherapy can affect healthy cells as well as cancer cells. Report any side effects. Continue your course of treatment even though you  feel ill unless your doctor tells you to stop. In some cases, you may be given additional medicines to help with side effects. Follow all directions for their use. You may get drowsy or dizzy. Do not drive, use machinery, or do anything that needs mental alertness until you know how this medicine affects you. Do not stand or sit up quickly, especially if you are an older patient. This reduces the risk of dizzy or fainting spells. Call your health care professional for advice if you get a fever, chills, or sore throat, or other symptoms of a cold or flu. Do not treat yourself. This medicine decreases your body's  ability to fight infections. Try to avoid being around people who are sick. Avoid taking products that contain aspirin, acetaminophen, ibuprofen, naproxen, or ketoprofen unless instructed by your doctor. These medicines may hide a fever. This medicine may increase your risk to bruise or bleed. Call your doctor or health care professional if you notice any unusual bleeding. Be careful brushing and flossing your teeth or using a toothpick because you may get an infection or bleed more easily. If you have any dental work done, tell your dentist you are receiving this medicine. Do not become pregnant while taking this medicine or for 6 months after stopping it. Women should inform their health care professional if they wish to become pregnant or think they might be pregnant. Men should not father a child while taking this medicine and for 3 months after stopping it. There is potential for serious side effects to an unborn child. Talk to your health care professional for more information. Do not breast-feed an infant while taking this medicine or for 7 days after stopping it. This medicine has caused ovarian failure in some women. This medicine may make it more difficult to get pregnant. Talk to your health care professional if you are concerned about your fertility. This medicine has caused decreased sperm counts in some men. This may make it more difficult to father a child. Talk to your health care professional if you are concerned about your fertility. What side effects may I notice from receiving this medication? Side effects that you should report to your doctor or health care professional as soon as possible: allergic reactions like skin rash, itching or hives, swelling of the face, lips, or tongue chest pain diarrhea flushing, runny nose, sweating during infusion low blood counts - this medicine may decrease the number of white blood cells, red blood cells and platelets. You may be at increased risk  for infections and bleeding. nausea, vomiting pain, swelling, warmth in the leg signs of decreased platelets or bleeding - bruising, pinpoint red spots on the skin, black, tarry stools, blood in the urine signs of infection - fever or chills, cough, sore throat, pain or difficulty passing urine signs of decreased red blood cells - unusually weak or tired, fainting spells, lightheadedness Side effects that usually do not require medical attention (report to your doctor or health care professional if they continue or are bothersome): constipation hair loss headache loss of appetite mouth sores stomach pain This list may not describe all possible side effects. Call your doctor for medical advice about side effects. You may report side effects to FDA at 1-800-FDA-1088. Where should I keep my medication? This drug is given in a hospital or clinic and will not be stored at home. NOTE: This sheet is a summary. It may not cover all possible information. If you have questions about this medicine,  talk to your doctor, pharmacist, or health care provider.  2022 [employer] Standard (2021-01-08 00:00:00)  Leucovorin injection What is this medication? LEUCOVORIN (loo koe VOR in) is used to prevent or treat the harmful effects of some medicines. This medicine is used to treat anemia caused by a low amount of folic acid in the body. It is also used with 5-fluorouracil (5-FU) to treat colon cancer. This medicine may be used for other purposes; ask your health care provider or pharmacist if you have questions. What should I tell my care team before I take this medication? They need to know if you have any of these conditions: anemia from low levels of vitamin B-12 in the blood an unusual or allergic reaction to leucovorin, folic acid, other medicines, foods, dyes, or preservatives pregnant or trying to get pregnant breast-feeding How should I use this medication? This medicine is for injection into a  muscle or into a vein. It is given by a health care professional in a hospital or clinic setting. Talk to your pediatrician regarding the use of this medicine in children. Special care may be needed. Overdosage: If you think you have taken too much of this medicine contact a poison control center or emergency room at once. NOTE: This medicine is only for you. Do not share this medicine with others. What if I miss a dose? This does not apply. What may interact with this medication? capecitabine fluorouracil phenobarbital phenytoin primidone trimethoprim-sulfamethoxazole This list may not describe all possible interactions. Give your health care provider a list of all the medicines, herbs, non-prescription drugs, or dietary supplements you use. Also tell them if you smoke, drink alcohol, or use illegal drugs. Some items may interact with your medicine. What should I watch for while using this medication? Your condition will be monitored carefully while you are receiving this medicine. This medicine may increase the side effects of 5-fluorouracil, 5-FU. Tell your doctor or health care professional if you have diarrhea or mouth sores that do not get better or that get worse. What side effects may I notice from receiving this medication? Side effects that you should report to your doctor or health care professional as soon as possible: allergic reactions like skin rash, itching or hives, swelling of the face, lips, or tongue breathing problems fever, infection mouth sores unusual bleeding or bruising unusually weak or tired Side effects that usually do not require medical attention (report to your doctor or health care professional if they continue or are bothersome): constipation or diarrhea loss of appetite nausea, vomiting This list may not describe all possible side effects. Call your doctor for medical advice about side effects. You may report side effects to FDA at  1-800-FDA-1088. Where should I keep my medication? This drug is given in a hospital or clinic and will not be stored at home. NOTE: This sheet is a summary. It may not cover all possible information. If you have questions about this medicine, talk to your doctor, pharmacist, or health care provider.  2022 Else[employer]/Gold Standard (2007-10-28 00:00:00)  Fluorouracil, 5-FU injection What is this medication? FLUOROURACIL, 5-FU (flure oh YOOR a sil) is a chemotherapy drug. It slows the growth of cancer cells. This medicine is used to treat many types of cancer like breast cancer, colon or rectal cancer, pancreatic cancer, and stomach cancer. This medicine may be used for other purposes; ask your health care provider or pharmacist if you have questions. COMMON BRAND NAME(S): Adrucil What should I tell my care team  before I take this medication? They need to know if you have any of these conditions: blood disorders dihydropyrimidine dehydrogenase (DPD) deficiency infection (especially a virus infection such as chickenpox, cold sores, or herpes) kidney disease liver disease malnourished, poor nutrition recent or ongoing radiation therapy an unusual or allergic reaction to fluorouracil, other chemotherapy, other medicines, foods, dyes, or preservatives pregnant or trying to get pregnant breast-feeding How should I use this medication? This drug is given as an infusion or injection into a vein. It is administered in a hospital or clinic by a specially trained health care professional. Talk to your pediatrician regarding the use of this medicine in children. Special care may be needed. Overdosage: If you think you have taken too much of this medicine contact a poison control center or emergency room at once. NOTE: This medicine is only for you. Do not share this medicine with others. What if I miss a dose? It is important not to miss your dose. Call your doctor or health care professional if you  are unable to keep an appointment. What may interact with this medication? Do not take this medicine with any of the following medications: live virus vaccines This medicine may also interact with the following medications: medicines that treat or prevent blood clots like warfarin, enoxaparin, and dalteparin This list may not describe all possible interactions. Give your health care provider a list of all the medicines, herbs, non-prescription drugs, or dietary supplements you use. Also tell them if you smoke, drink alcohol, or use illegal drugs. Some items may interact with your medicine. What should I watch for while using this medication? Visit your doctor for checks on your progress. This drug may make you feel generally unwell. This is not uncommon, as chemotherapy can affect healthy cells as well as cancer cells. Report any side effects. Continue your course of treatment even though you feel ill unless your doctor tells you to stop. In some cases, you may be given additional medicines to help with side effects. Follow all directions for their use. Call your doctor or health care professional for advice if you get a fever, chills or sore throat, or other symptoms of a cold or flu. Do not treat yourself. This drug decreases your body's ability to fight infections. Try to avoid being around people who are sick. This medicine may increase your risk to bruise or bleed. Call your doctor or health care professional if you notice any unusual bleeding. Be careful brushing and flossing your teeth or using a toothpick because you may get an infection or bleed more easily. If you have any dental work done, tell your dentist you are receiving this medicine. Avoid taking products that contain aspirin, acetaminophen, ibuprofen, naproxen, or ketoprofen unless instructed by your doctor. These medicines may hide a fever. Do not become pregnant while taking this medicine. Women should inform their doctor if they  wish to become pregnant or think they might be pregnant. There is a potential for serious side effects to an unborn child. Talk to your health care professional or pharmacist for more information. Do not breast-feed an infant while taking this medicine. Men should inform their doctor if they wish to father a child. This medicine may lower sperm counts. Do not treat diarrhea with over the counter products. Contact your doctor if you have diarrhea that lasts more than 2 days or if it is severe and watery. This medicine can make you more sensitive to the sun. Keep out of the  sun. If you cannot avoid being in the sun, wear protective clothing and use sunscreen. Do not use sun lamps or tanning beds/booths. What side effects may I notice from receiving this medication? Side effects that you should report to your doctor or health care professional as soon as possible: allergic reactions like skin rash, itching or hives, swelling of the face, lips, or tongue low blood counts - this medicine may decrease the number of white blood cells, red blood cells and platelets. You may be at increased risk for infections and bleeding. signs of infection - fever or chills, cough, sore throat, pain or difficulty passing urine signs of decreased platelets or bleeding - bruising, pinpoint red spots on the skin, black, tarry stools, blood in the urine signs of decreased red blood cells - unusually weak or tired, fainting spells, lightheadedness breathing problems changes in vision chest pain mouth sores nausea and vomiting pain, swelling, redness at site where injected pain, tingling, numbness in the hands or feet redness, swelling, or sores on hands or feet stomach pain unusual bleeding Side effects that usually do not require medical attention (report to your doctor or health care professional if they continue or are bothersome): changes in finger or toe nails diarrhea dry or itchy skin hair loss headache loss  of appetite sensitivity of eyes to the light stomach upset unusually teary eyes This list may not describe all possible side effects. Call your doctor for medical advice about side effects. You may report side effects to FDA at 1-800-FDA-1088. Where should I keep my medication? This drug is given in a hospital or clinic and will not be stored at home. NOTE: This sheet is a summary. It may not cover all possible information. If you have questions about this medicine, talk to your doctor, pharmacist, or health care provider.  2022 [employer]/Gold Standard (2021-01-08 00:00:00)

## 2021-04-17 NOTE — Progress Notes (Signed)
[facility] OFFICE PROGRESS NOTE   Diagnosis: Colon cancer  INTERVAL HISTORY:   Louis [name] completed another cycle of FOLFIRI/Avastin on 04/03/2021.  He had nausea and vomiting on day 2.  No mouth sores, bleeding, or symptom of thrombosis.  The left axillary lymph node is smaller.  He feels the left inguinal node is unchanged.  He is working.  Objective:  Vital signs in last 24 hours:  Blood pressure 140/72, pulse 71, temperature 98.1 F (36.7 C), temperature source Oral, resp. rate 18, height '6\' 4"'  (1.93 m), weight 246 lb (111.6 kg), SpO2 100 %.    HEENT: No thrush or ulcers Lymphatics: 1.5 cm mobile left axillary node, 3-4 cm left inguinal node Resp: Lungs clear bilaterally Cardio: Regular rate and rhythm GI: No hepatosplenomegaly, nontender Vascular: No leg edema   Portacath/PICC-without erythema  Lab Results:  Lab Results  Component Value Date   WBC 3.8 (L) 04/17/2021   HGB 9.7 (L) 04/17/2021   HCT 31.6 (L) 04/17/2021   MCV 83.6 04/17/2021   PLT 128 (L) 04/17/2021   NEUTROABS 1.8 04/17/2021    CMP  Lab Results  Component Value Date   NA 138 04/17/2021   K 4.4 04/17/2021   CL 108 04/17/2021   CO2 23 04/17/2021   GLUCOSE 157 (H) 04/17/2021   BUN 29 (H) 04/17/2021   CREATININE 2.40 (H) 04/17/2021   CALCIUM 8.8 (L) 04/17/2021   PROT 6.8 04/17/2021   ALBUMIN 4.0 04/17/2021   AST 10 (L) 04/17/2021   ALT 9 04/17/2021   ALKPHOS 69 04/17/2021   BILITOT 0.4 04/17/2021   GFRNONAA 28 (L) 04/17/2021   GFRAA 28 (L) 09/20/2019    Lab Results  Component Value Date   CEA1 2.61 01/02/2021   CEA 3.76 01/02/2021     Medications: I have reviewed the patient's current medications.   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), isolated mesenteric implant-resected, multiple tumor deposits Sigmoid/descending resection and creation of a descending colostomy 04/10/2016 MSI-stable, no loss of mismatch repair protein  expression Foundation 1- BRAF V600E positive, MS-stable, intermediate tumor mutation burden, no RAS mutation CT abdomen/pelvis 04/02/2016-no evidence of distant metastatic disease Cycle 1 FOLFOX 05/21/2016 Cycle 2 FOLFOX 06/04/2016 Cycle 3 FOLFOX 06/18/2016 Cycle 4 FOLFOX 07/02/2016 (oxaliplatin held secondary to thrombocytopenia) Cycle 5 FOLFOX 07/16/2016 Cycle 6 FOLFOX 07/30/2016 Cycle 7 FOLFOX 08/13/2016 (oxaliplatin held and 5-FU dose reduced) Cycle 8 FOLFOX 09/03/2016 (oxaliplatin held secondary to neuropathy) Cycle 9 FOLFOX 09/17/2016 (oxaliplatin held secondary to neuropathy) Cycle 10 FOLFOX 10/02/2016 Cycle 11 FOLFOX 10/15/2016 (oxaliplatin eliminated from the regimen) Cycle 12 FOLFOX 10/30/2016 (oxaliplatin eliminated) CTs 05/12/2017-no evidence of recurrent disease, right inguinal hernia containing bladder Colonoscopy 07/21/2017, 3 polyps were removed from the descending and transverse colon, fragments of tubular and tubulovillous adenoma CTs 05/19/2018-no evidence of recurrent disease, status post hernia repair, right upper lobe pneumonia CTs 05/20/2019-resolution of right upper lobe pneumonia, no evidence of metastatic disease Colonoscopy 01/25/2020-multiple polyps removed-tubular adenomas, poor preparation, repeat colonoscopy recommended CTs neck, chest, and abdomen/pelvis 08/27/2020- new 2 centimeter left axillary node, new 1.9 cm left inguinal node chronic mildly prominent portacaval node Ultrasound biopsy of left inguinal node on 10/29/2020-metastatic adenocarcinoma consistent with colon adenocarcinoma PET [id number] hypermetabolic left inguinal and left axillary nodes, solitary focus of hypermetabolic activity in the right lower quadrant small bowel Cycle 1 FOLFOX 12/19/2020 Cycle 2 FOLFOX 01/02/2021, oxaliplatin dose reduced secondary to neutropenia and thrombocytopenia Cycle 3 FOLFOX 01/16/2021 Cycle 4 FOLFOX 01/30/2021 CTs 02/11/2021-no change in left inguinal  and left  axillary nodes, no evidence of disease progression, stable wall thickening involving a loop of distal ileum Cycle 1 FOLFIRI/Avastin 02/27/2021 Cycle 2 FOLFIRI/Avastin 03/13/2021 Cycle 3 FOLFIRI/Avastin 04/03/2021 Cycle 4 FOLFIRI/Avastin 04/17/2021   2.   Chronic renal insufficiency   3.   Hypertension   4.  Inflamed sebaceous cyst at the upper back-status post incision and drainage   5.  Thrombocytopenia secondary chemotherapy-oxaliplatin dose reduced beginning with cycle 3 FOLFOX, oxaliplatin held with cycle 4 and cycle 7 FOLFOX   6.  Oxaliplatin neuropathy-improved   7.  History of Mucositis secondary chemotherapy   8.  Symptoms of pneumonia January 2020-CT 05/19/2018 consistent with right upper lobe pneumonia, Levaquin prescribed 05/20/2018   9.  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 [name] appears stable.  He had nausea and vomiting following the last cycle of chemotherapy.  Emend will be added to the antiemetic premedication regimen today.  The palpable lymph nodes appear smaller.  He will complete cycle 4 FOLFIRI/Avastin today.  He will return for an office visit in the next cycle of chemotherapy on 05/08/2020.  He will be scheduled for a restaging CT after the next cycle of chemotherapy.  Betsy Coder, MD  04/17/2021  9:31 AM

## 2021-04-19 ENCOUNTER — Other Ambulatory Visit: Payer: Self-pay

## 2021-04-19 ENCOUNTER — Inpatient Hospital Stay: Payer: Medicare Other

## 2021-04-19 VITALS — BP 135/66 | HR 64 | Temp 98.0°F | Resp 18

## 2021-04-19 DIAGNOSIS — D6959 Other secondary thrombocytopenia: Secondary | ICD-10-CM | POA: Diagnosis not present

## 2021-04-19 DIAGNOSIS — Z5111 Encounter for antineoplastic chemotherapy: Secondary | ICD-10-CM | POA: Diagnosis not present

## 2021-04-19 DIAGNOSIS — I10 Essential (primary) hypertension: Secondary | ICD-10-CM | POA: Diagnosis not present

## 2021-04-19 DIAGNOSIS — T451X5A Adverse effect of antineoplastic and immunosuppressive drugs, initial encounter: Secondary | ICD-10-CM | POA: Diagnosis not present

## 2021-04-19 DIAGNOSIS — C189 Malignant neoplasm of colon, unspecified: Secondary | ICD-10-CM

## 2021-04-19 DIAGNOSIS — N189 Chronic kidney disease, unspecified: Secondary | ICD-10-CM | POA: Diagnosis not present

## 2021-04-19 DIAGNOSIS — C187 Malignant neoplasm of sigmoid colon: Secondary | ICD-10-CM | POA: Diagnosis not present

## 2021-04-19 MED ORDER — HEPARIN SOD (PORK) LOCK FLUSH 100 UNIT/ML IV SOLN
500.0000 [IU] | Freq: Once | INTRAVENOUS | Status: AC | PRN
  Administered 2021-04-19: 500 [IU]

## 2021-04-19 MED ORDER — SODIUM CHLORIDE 0.9% FLUSH
10.0000 mL | INTRAVENOUS | Status: DC | PRN
  Administered 2021-04-19: 10 mL

## 2021-05-06 ENCOUNTER — Other Ambulatory Visit: Payer: Self-pay | Admitting: Oncology

## 2021-05-07 ENCOUNTER — Other Ambulatory Visit (HOSPITAL_BASED_OUTPATIENT_CLINIC_OR_DEPARTMENT_OTHER): Payer: Self-pay

## 2021-05-07 DIAGNOSIS — C189 Malignant neoplasm of colon, unspecified: Secondary | ICD-10-CM

## 2021-05-07 LAB — PROTEIN, URINE, 24 HOUR
Collection Interval-UPROT: 24 hours
Protein, 24H Urine: 1562 mg/d — ABNORMAL HIGH (ref 50–100)
Protein, Urine: 71 mg/dL
Urine Total Volume-UPROT: 2200 mL

## 2021-05-08 ENCOUNTER — Inpatient Hospital Stay: Payer: Medicare Other

## 2021-05-08 ENCOUNTER — Encounter: Payer: Self-pay | Admitting: Nurse Practitioner

## 2021-05-08 ENCOUNTER — Other Ambulatory Visit: Payer: Self-pay

## 2021-05-08 ENCOUNTER — Encounter: Payer: Self-pay | Admitting: *Deleted

## 2021-05-08 ENCOUNTER — Inpatient Hospital Stay: Payer: Medicare Other | Attending: Oncology

## 2021-05-08 ENCOUNTER — Inpatient Hospital Stay (HOSPITAL_BASED_OUTPATIENT_CLINIC_OR_DEPARTMENT_OTHER): Payer: Medicare Other | Admitting: Nurse Practitioner

## 2021-05-08 VITALS — BP 130/68 | HR 70 | Temp 97.9°F | Resp 18

## 2021-05-08 VITALS — BP 137/71 | HR 75 | Temp 97.8°F | Resp 20 | Ht 76.0 in | Wt 246.8 lb

## 2021-05-08 DIAGNOSIS — R809 Proteinuria, unspecified: Secondary | ICD-10-CM | POA: Diagnosis not present

## 2021-05-08 DIAGNOSIS — D6959 Other secondary thrombocytopenia: Secondary | ICD-10-CM | POA: Insufficient documentation

## 2021-05-08 DIAGNOSIS — C189 Malignant neoplasm of colon, unspecified: Secondary | ICD-10-CM

## 2021-05-08 DIAGNOSIS — I10 Essential (primary) hypertension: Secondary | ICD-10-CM | POA: Diagnosis not present

## 2021-05-08 DIAGNOSIS — Z452 Encounter for adjustment and management of vascular access device: Secondary | ICD-10-CM | POA: Diagnosis not present

## 2021-05-08 DIAGNOSIS — Z5111 Encounter for antineoplastic chemotherapy: Secondary | ICD-10-CM | POA: Insufficient documentation

## 2021-05-08 DIAGNOSIS — C187 Malignant neoplasm of sigmoid colon: Secondary | ICD-10-CM | POA: Diagnosis not present

## 2021-05-08 DIAGNOSIS — G620 Drug-induced polyneuropathy: Secondary | ICD-10-CM | POA: Diagnosis not present

## 2021-05-08 DIAGNOSIS — N189 Chronic kidney disease, unspecified: Secondary | ICD-10-CM | POA: Diagnosis not present

## 2021-05-08 DIAGNOSIS — C774 Secondary and unspecified malignant neoplasm of inguinal and lower limb lymph nodes: Secondary | ICD-10-CM | POA: Insufficient documentation

## 2021-05-08 LAB — CBC WITH DIFFERENTIAL (CANCER CENTER ONLY)
Abs Immature Granulocytes: 0.03 10*3/uL (ref 0.00–0.07)
Basophils Absolute: 0 10*3/uL (ref 0.0–0.1)
Basophils Relative: 1 %
Eosinophils Absolute: 0.3 10*3/uL (ref 0.0–0.5)
Eosinophils Relative: 9 %
HCT: 31.3 % — ABNORMAL LOW (ref 39.0–52.0)
Hemoglobin: 9.7 g/dL — ABNORMAL LOW (ref 13.0–17.0)
Immature Granulocytes: 1 %
Lymphocytes Relative: 28 %
Lymphs Abs: 1.1 10*3/uL (ref 0.7–4.0)
MCH: 26.4 pg (ref 26.0–34.0)
MCHC: 31 g/dL (ref 30.0–36.0)
MCV: 85.3 fL (ref 80.0–100.0)
Monocytes Absolute: 0.6 10*3/uL (ref 0.1–1.0)
Monocytes Relative: 16 %
Neutro Abs: 1.7 10*3/uL (ref 1.7–7.7)
Neutrophils Relative %: 45 %
Platelet Count: 166 10*3/uL (ref 150–400)
RBC: 3.67 MIL/uL — ABNORMAL LOW (ref 4.22–5.81)
RDW: 20.6 % — ABNORMAL HIGH (ref 11.5–15.5)
WBC Count: 3.8 10*3/uL — ABNORMAL LOW (ref 4.0–10.5)
nRBC: 0 % (ref 0.0–0.2)

## 2021-05-08 LAB — CMP (CANCER CENTER ONLY)
ALT: 10 U/L (ref 0–44)
AST: 10 U/L — ABNORMAL LOW (ref 15–41)
Albumin: 3.8 g/dL (ref 3.5–5.0)
Alkaline Phosphatase: 82 U/L (ref 38–126)
Anion gap: 7 (ref 5–15)
BUN: 28 mg/dL — ABNORMAL HIGH (ref 8–23)
CO2: 24 mmol/L (ref 22–32)
Calcium: 8.8 mg/dL — ABNORMAL LOW (ref 8.9–10.3)
Chloride: 108 mmol/L (ref 98–111)
Creatinine: 2.42 mg/dL — ABNORMAL HIGH (ref 0.61–1.24)
GFR, Estimated: 27 mL/min — ABNORMAL LOW (ref >60)
Glucose, Bld: 146 mg/dL — ABNORMAL HIGH (ref 70–99)
Potassium: 4.2 mmol/L (ref 3.5–5.1)
Sodium: 139 mmol/L (ref 135–145)
Total Bilirubin: 0.3 mg/dL (ref 0.3–1.2)
Total Protein: 6.5 g/dL (ref 6.5–8.1)

## 2021-05-08 LAB — TOTAL PROTEIN, URINE DIPSTICK: Protein, ur: 30 mg/dL — AB

## 2021-05-08 MED ORDER — FLUOROURACIL CHEMO INJECTION 2.5 GM/50ML
300.0000 mg/m2 | Freq: Once | INTRAVENOUS | Status: AC
  Administered 2021-05-08: 750 mg via INTRAVENOUS
  Filled 2021-05-08: qty 15

## 2021-05-08 MED ORDER — SODIUM CHLORIDE 0.9 % IV SOLN
120.0000 mg/m2 | Freq: Once | INTRAVENOUS | Status: AC
  Administered 2021-05-08: 300 mg via INTRAVENOUS
  Filled 2021-05-08: qty 15

## 2021-05-08 MED ORDER — SODIUM CHLORIDE 0.9 % IV SOLN
10.0000 mg | Freq: Once | INTRAVENOUS | Status: AC
  Administered 2021-05-08: 10 mg via INTRAVENOUS
  Filled 2021-05-08: qty 1

## 2021-05-08 MED ORDER — SODIUM CHLORIDE 0.9 % IV SOLN
150.0000 mg | Freq: Once | INTRAVENOUS | Status: AC
  Administered 2021-05-08: 150 mg via INTRAVENOUS
  Filled 2021-05-08: qty 5

## 2021-05-08 MED ORDER — SODIUM CHLORIDE 0.9 % IV SOLN: Freq: Once | INTRAVENOUS | Status: AC

## 2021-05-08 MED ORDER — SODIUM CHLORIDE 0.9 % IV SOLN
300.0000 mg/m2 | Freq: Once | INTRAVENOUS | Status: AC
  Administered 2021-05-08: 738 mg via INTRAVENOUS
  Filled 2021-05-08: qty 36.9

## 2021-05-08 MED ORDER — PALONOSETRON HCL INJECTION 0.25 MG/5ML
0.2500 mg | Freq: Once | INTRAVENOUS | Status: AC
  Administered 2021-05-08: 0.25 mg via INTRAVENOUS
  Filled 2021-05-08: qty 5

## 2021-05-08 MED ORDER — SODIUM CHLORIDE 0.9 % IV SOLN
1800.0000 mg/m2 | INTRAVENOUS | Status: DC
  Administered 2021-05-08: 4450 mg via INTRAVENOUS
  Filled 2021-05-08: qty 89

## 2021-05-08 MED ORDER — ATROPINE SULFATE 1 MG/ML IV SOLN
0.5000 mg | Freq: Once | INTRAVENOUS | Status: AC | PRN
  Administered 2021-05-08: 0.5 mg via INTRAVENOUS
  Filled 2021-05-08: qty 1

## 2021-05-08 NOTE — Patient Instructions (Signed)
[facility]   Discharge Instructions: Thank you for choosing [facility] to provide your oncology and hematology care.   If you have a lab appointment with the [facility], please go directly to the [facility] and check in at the registration area.   Wear comfortable clothing and clothing appropriate for easy access to any Portacath or PICC line.   We strive to give you quality time with your provider. You may need to reschedule your appointment if you arrive late (15 or more minutes).  Arriving late affects you and other patients whose appointments are after yours.  Also, if you miss three or more appointments without notifying the office, you may be dismissed from the clinic at the providers discretion.      For prescription refill requests, have your pharmacy contact our office and allow 72 hours for refills to be completed.    Today you received the following chemotherapy and/or immunotherapy agents Irinotecan (CAMPTOSAR), Leucovorin & Flourouracil (ADRUCIL).      To help prevent nausea and vomiting after your treatment, we encourage you to take your nausea medication as directed.  BELOW ARE SYMPTOMS THAT SHOULD BE REPORTED IMMEDIATELY: *FEVER GREATER THAN 100.4 F (38 C) OR HIGHER *CHILLS OR SWEATING *NAUSEA AND VOMITING THAT IS NOT CONTROLLED WITH YOUR NAUSEA MEDICATION *UNUSUAL SHORTNESS OF BREATH *UNUSUAL BRUISING OR BLEEDING *URINARY PROBLEMS (pain or burning when urinating, or frequent urination) *BOWEL PROBLEMS (unusual diarrhea, constipation, pain near the anus) TENDERNESS IN MOUTH AND THROAT WITH OR WITHOUT PRESENCE OF ULCERS (sore throat, sores in mouth, or a toothache) UNUSUAL RASH, SWELLING OR PAIN  UNUSUAL VAGINAL DISCHARGE OR ITCHING   Items with * indicate a potential emergency and should be followed up as soon as possible or go to the Emergency Department if any problems should occur.  Please show the CHEMOTHERAPY ALERT  CARD or IMMUNOTHERAPY ALERT CARD at check-in to the Emergency Department and triage nurse.  Should you have questions after your visit or need to cancel or reschedule your appointment, please contact [facility]  Dept: [phone]  and follow the prompts.  Office hours are 8:00 a.m. to 4:30 p.m. Monday - Friday. Please note that voicemails left after 4:00 p.m. may not be returned until the following business day.  We are closed weekends and major holidays. You have access to a nurse at all times for urgent questions. Please call the main number to the clinic Dept: [phone] and follow the prompts.   For any non-urgent questions, you may also contact your provider using MyChart. We now offer e-Visits for anyone 66 and older to request care online for non-urgent symptoms. For details visit [email or web address].   Also download the MyChart app! Go to the app store, search "MyChart", open the app, select [facility], and log in with your MyChart username and password.  Due to Covid, a mask is required upon entering the hospital/clinic. If you do not have a mask, one will be given to you upon arrival. For doctor visits, patients may have 1 support person aged 29 or older with them. For treatment visits, patients cannot have anyone with them due to current Covid guidelines and our immunocompromised population.   Irinotecan injection What is this medication? IRINOTECAN (ir in oh TEE kan ) is a chemotherapy drug. It is used to treat colon and rectal cancer. This medicine may be used for other purposes; ask your health care provider or pharmacist if you  have questions. COMMON BRAND NAME(S): Camptosar What should I tell my care team before I take this medication? They need to know if you have any of these conditions: dehydration diarrhea infection (especially a virus infection such as chickenpox, cold sores, or herpes) liver disease low blood counts, like low white cell,  platelet, or red cell counts low levels of calcium, magnesium, or potassium in the blood recent or ongoing radiation therapy an unusual or allergic reaction to irinotecan, other medicines, foods, dyes, or preservatives pregnant or trying to get pregnant breast-feeding How should I use this medication? This drug is given as an infusion into a vein. It is administered in a hospital or clinic by a specially trained health care professional. Talk to your pediatrician regarding the use of this medicine in children. Special care may be needed. Overdosage: If you think you have taken too much of this medicine contact a poison control center or emergency room at once. NOTE: This medicine is only for you. Do not share this medicine with others. What if I miss a dose? It is important not to miss your dose. Call your doctor or health care professional if you are unable to keep an appointment. What may interact with this medication? Do not take this medicine with any of the following medications: cobicistat itraconazole This medicine may interact with the following medications: antiviral medicines for HIV or AIDS certain antibiotics like rifampin or rifabutin certain medicines for fungal infections like ketoconazole, posaconazole, and voriconazole certain medicines for seizures like carbamazepine, phenobarbital, phenotoin clarithromycin gemfibrozil nefazodone [facility]. John's Wort This list may not describe all possible interactions. Give your health care provider a list of all the medicines, herbs, non-prescription drugs, or dietary supplements you use. Also tell them if you smoke, drink alcohol, or use illegal drugs. Some items may interact with your medicine. What should I watch for while using this medication? Your condition will be monitored carefully while you are receiving this medicine. You will need important blood work done while you are taking this medicine. This drug may make you feel  generally unwell. This is not uncommon, as chemotherapy can affect healthy cells as well as cancer cells. Report any side effects. Continue your course of treatment even though you feel ill unless your doctor tells you to stop. In some cases, you may be given additional medicines to help with side effects. Follow all directions for their use. You may get drowsy or dizzy. Do not drive, use machinery, or do anything that needs mental alertness until you know how this medicine affects you. Do not stand or sit up quickly, especially if you are an older patient. This reduces the risk of dizzy or fainting spells. Call your health care professional for advice if you get a fever, chills, or sore throat, or other symptoms of a cold or flu. Do not treat yourself. This medicine decreases your body's ability to fight infections. Try to avoid being around people who are sick. Avoid taking products that contain aspirin, acetaminophen, ibuprofen, naproxen, or ketoprofen unless instructed by your doctor. These medicines may hide a fever. This medicine may increase your risk to bruise or bleed. Call your doctor or health care professional if you notice any unusual bleeding. Be careful brushing and flossing your teeth or using a toothpick because you may get an infection or bleed more easily. If you have any dental work done, tell your dentist you are receiving this medicine. Do not become pregnant while taking this medicine or for  6 months after stopping it. Women should inform their health care professional if they wish to become pregnant or think they might be pregnant. Men should not father a child while taking this medicine and for 3 months after stopping it. There is potential for serious side effects to an unborn child. Talk to your health care professional for more information. Do not breast-feed an infant while taking this medicine or for 7 days after stopping it. This medicine has caused ovarian failure in some  women. This medicine may make it more difficult to get pregnant. Talk to your health care professional if you are concerned about your fertility. This medicine has caused decreased sperm counts in some men. This may make it more difficult to father a child. Talk to your health care professional if you are concerned about your fertility. What side effects may I notice from receiving this medication? Side effects that you should report to your doctor or health care professional as soon as possible: allergic reactions like skin rash, itching or hives, swelling of the face, lips, or tongue chest pain diarrhea flushing, runny nose, sweating during infusion low blood counts - this medicine may decrease the number of white blood cells, red blood cells and platelets. You may be at increased risk for infections and bleeding. nausea, vomiting pain, swelling, warmth in the leg signs of decreased platelets or bleeding - bruising, pinpoint red spots on the skin, black, tarry stools, blood in the urine signs of infection - fever or chills, cough, sore throat, pain or difficulty passing urine signs of decreased red blood cells - unusually weak or tired, fainting spells, lightheadedness Side effects that usually do not require medical attention (report to your doctor or health care professional if they continue or are bothersome): constipation hair loss headache loss of appetite mouth sores stomach pain This list may not describe all possible side effects. Call your doctor for medical advice about side effects. You may report side effects to FDA at 1-800-FDA-1088. Where should I keep my medication? This drug is given in a hospital or clinic and will not be stored at home. NOTE: This sheet is a summary. It may not cover all possible information. If you have questions about this medicine, talk to your doctor, pharmacist, or health care provider.  2022 Elsevier/Gold Standard (2021-01-08  00:00:00)  Leucovorin injection What is this medication? LEUCOVORIN (loo koe VOR in) is used to prevent or treat the harmful effects of some medicines. This medicine is used to treat anemia caused by a low amount of folic acid in the body. It is also used with 5-fluorouracil (5-FU) to treat colon cancer. This medicine may be used for other purposes; ask your health care provider or pharmacist if you have questions. What should I tell my care team before I take this medication? They need to know if you have any of these conditions: anemia from low levels of vitamin B-12 in the blood an unusual or allergic reaction to leucovorin, folic acid, other medicines, foods, dyes, or preservatives pregnant or trying to get pregnant breast-feeding How should I use this medication? This medicine is for injection into a muscle or into a vein. It is given by a health care professional in a hospital or clinic setting. Talk to your pediatrician regarding the use of this medicine in children. Special care may be needed. Overdosage: If you think you have taken too much of this medicine contact a poison control center or emergency room at once. NOTE: This  medicine is only for you. Do not share this medicine with others. What if I miss a dose? This does not apply. What may interact with this medication? capecitabine fluorouracil phenobarbital phenytoin primidone trimethoprim-sulfamethoxazole This list may not describe all possible interactions. Give your health care provider a list of all the medicines, herbs, non-prescription drugs, or dietary supplements you use. Also tell them if you smoke, drink alcohol, or use illegal drugs. Some items may interact with your medicine. What should I watch for while using this medication? Your condition will be monitored carefully while you are receiving this medicine. This medicine may increase the side effects of 5-fluorouracil, 5-FU. Tell your doctor or health care  professional if you have diarrhea or mouth sores that do not get better or that get worse. What side effects may I notice from receiving this medication? Side effects that you should report to your doctor or health care professional as soon as possible: allergic reactions like skin rash, itching or hives, swelling of the face, lips, or tongue breathing problems fever, infection mouth sores unusual bleeding or bruising unusually weak or tired Side effects that usually do not require medical attention (report to your doctor or health care professional if they continue or are bothersome): constipation or diarrhea loss of appetite nausea, vomiting This list may not describe all possible side effects. Call your doctor for medical advice about side effects. You may report side effects to FDA at 1-800-FDA-1088. Where should I keep my medication? This drug is given in a hospital or clinic and will not be stored at home. NOTE: This sheet is a summary. It may not cover all possible information. If you have questions about this medicine, talk to your doctor, pharmacist, or health care provider.  2022 Elsevier/Gold Standard (2007-10-28 00:00:00)  Fluorouracil, 5-FU injection What is this medication? FLUOROURACIL, 5-FU (flure oh YOOR a sil) is a chemotherapy drug. It slows the growth of cancer cells. This medicine is used to treat many types of cancer like breast cancer, colon or rectal cancer, pancreatic cancer, and stomach cancer. This medicine may be used for other purposes; ask your health care provider or pharmacist if you have questions. COMMON BRAND NAME(S): Adrucil What should I tell my care team before I take this medication? They need to know if you have any of these conditions: blood disorders dihydropyrimidine dehydrogenase (DPD) deficiency infection (especially a virus infection such as chickenpox, cold sores, or herpes) kidney disease liver disease malnourished, poor  nutrition recent or ongoing radiation therapy an unusual or allergic reaction to fluorouracil, other chemotherapy, other medicines, foods, dyes, or preservatives pregnant or trying to get pregnant breast-feeding How should I use this medication? This drug is given as an infusion or injection into a vein. It is administered in a hospital or clinic by a specially trained health care professional. Talk to your pediatrician regarding the use of this medicine in children. Special care may be needed. Overdosage: If you think you have taken too much of this medicine contact a poison control center or emergency room at once. NOTE: This medicine is only for you. Do not share this medicine with others. What if I miss a dose? It is important not to miss your dose. Call your doctor or health care professional if you are unable to keep an appointment. What may interact with this medication? Do not take this medicine with any of the following medications: live virus vaccines This medicine may also interact with the following medications: medicines that treat  or prevent blood clots like warfarin, enoxaparin, and dalteparin This list may not describe all possible interactions. Give your health care provider a list of all the medicines, herbs, non-prescription drugs, or dietary supplements you use. Also tell them if you smoke, drink alcohol, or use illegal drugs. Some items may interact with your medicine. What should I watch for while using this medication? Visit your doctor for checks on your progress. This drug may make you feel generally unwell. This is not uncommon, as chemotherapy can affect healthy cells as well as cancer cells. Report any side effects. Continue your course of treatment even though you feel ill unless your doctor tells you to stop. In some cases, you may be given additional medicines to help with side effects. Follow all directions for their use. Call your doctor or health care  professional for advice if you get a fever, chills or sore throat, or other symptoms of a cold or flu. Do not treat yourself. This drug decreases your body's ability to fight infections. Try to avoid being around people who are sick. This medicine may increase your risk to bruise or bleed. Call your doctor or health care professional if you notice any unusual bleeding. Be careful brushing and flossing your teeth or using a toothpick because you may get an infection or bleed more easily. If you have any dental work done, tell your dentist you are receiving this medicine. Avoid taking products that contain aspirin, acetaminophen, ibuprofen, naproxen, or ketoprofen unless instructed by your doctor. These medicines may hide a fever. Do not become pregnant while taking this medicine. Women should inform their doctor if they wish to become pregnant or think they might be pregnant. There is a potential for serious side effects to an unborn child. Talk to your health care professional or pharmacist for more information. Do not breast-feed an infant while taking this medicine. Men should inform their doctor if they wish to father a child. This medicine may lower sperm counts. Do not treat diarrhea with over the counter products. Contact your doctor if you have diarrhea that lasts more than 2 days or if it is severe and watery. This medicine can make you more sensitive to the sun. Keep out of the sun. If you cannot avoid being in the sun, wear protective clothing and use sunscreen. Do not use sun lamps or tanning beds/booths. What side effects may I notice from receiving this medication? Side effects that you should report to your doctor or health care professional as soon as possible: allergic reactions like skin rash, itching or hives, swelling of the face, lips, or tongue low blood counts - this medicine may decrease the number of white blood cells, red blood cells and platelets. You may be at increased risk for  infections and bleeding. signs of infection - fever or chills, cough, sore throat, pain or difficulty passing urine signs of decreased platelets or bleeding - bruising, pinpoint red spots on the skin, black, tarry stools, blood in the urine signs of decreased red blood cells - unusually weak or tired, fainting spells, lightheadedness breathing problems changes in vision chest pain mouth sores nausea and vomiting pain, swelling, redness at site where injected pain, tingling, numbness in the hands or feet redness, swelling, or sores on hands or feet stomach pain unusual bleeding Side effects that usually do not require medical attention (report to your doctor or health care professional if they continue or are bothersome): changes in finger or toe nails diarrhea dry or  itchy skin hair loss headache loss of appetite sensitivity of eyes to the light stomach upset unusually teary eyes This list may not describe all possible side effects. Call your doctor for medical advice about side effects. You may report side effects to FDA at 1-800-FDA-1088. Where should I keep my medication? This drug is given in a hospital or clinic and will not be stored at home. NOTE: This sheet is a summary. It may not cover all possible information. If you have questions about this medicine, talk to your doctor, pharmacist, or health care provider.  2022 Elsevier/Gold Standard (2021-01-08 00:00:00)  The chemotherapy medication bag should finish at 46 hours, 96 hours, or 7 days. For example, if your pump is scheduled for 46 hours and it was put on at 4:00 p.m., it should finish at 2:00 p.m. the day it is scheduled to come off regardless of your appointment time.     Estimated time to finish at 11:00 a.m. on Friday 05/10/2021.   If the display on your pump reads "Low Volume" and it is beeping, take the batteries out of the pump and come to the cancer center for it to be taken off.   If the pump alarms go off  prior to the pump reading "Low Volume" then call [phone] and someone can assist you.  If the plunger comes out and the chemotherapy medication is leaking out, please use your home chemo spill kit to clean up the spill. Do NOT use paper towels or other household products.  If you have problems or questions regarding your pump, please call either [phone] (24 hours a day) or the cancer center Monday-Friday 8:00 a.m.- 4:30 p.m. at the clinic number and we will assist you. If you are unable to get assistance, then go to the nearest Emergency Department and ask the staff to contact the IV team for assistance.

## 2021-05-08 NOTE — Progress Notes (Signed)
Patient presents for treatment. RN assessment completed along with the following:  Labs/vitals reviewed - Yes, and verbal order per Lattie Haw, NP; okay to proceed with treatment today and hold Avastin.    Weight within 10% of previous measurement - Yes Oncology Treatment Attestation completed for current therapy- Yes, on date 02/13/2021 Informed consent completed and reflects current therapy/intent - Yes, on date 02/27/2021             Provider progress note reviewed -  04/17/2021 Treatment/Antibody/Supportive plan reviewed - Yes, and hold Avastin S&H and other orders reviewed - Yes, and there are no additional orders identified. Previous treatment date reviewed - Yes, and the appropriate amount of time has elapsed between treatments. Clinic Hand Off Received from - Yes from Lattie Haw, NP  Patient to proceed with treatment.

## 2021-05-08 NOTE — Progress Notes (Signed)
[facility] OFFICE PROGRESS NOTE   Diagnosis: Colon cancer  INTERVAL HISTORY:   [name] returns as scheduled.  He completed cycle 4 FOLFIRI 04/17/2021.  Avastin was held due to elevated urine protein.  He in general did not feel well after cycle 4.  He does not recall if he has significant nausea.  No mouth sores.  He has intermittent loose stools, maybe 1 every 3 to 4 days.  He takes a single Imodium tablet with good results.  Mild intermittent blood with nose blowing.  No other bleeding.  He can no longer feel the left axillary lymph node.  Objective:  Vital signs in last 24 hours:  Blood pressure 137/71, pulse 75, temperature 97.8 F (36.6 C), temperature source Oral, resp. rate 20, height _0  (1.93 m), weight 246 lb 12.8 oz (111.9 kg), SpO2 98 %.    HEENT: No thrush or ulcers. Lymphatics: Approximate 1 cm mobile left axillary lymph node.  Approximate 4 cm left inguinal lymph node. Resp: Lungs clear bilaterally. Cardio: Regular rate and rhythm. GI: Abdomen soft and nontender.  No hepatosplenomegaly. Vascular: No leg edema. Port-A-Cath without erythema.  Lab Results:  Lab Results  Component Value Date   WBC 3.8 (L) 05/08/2021   HGB 9.7 (L) 05/08/2021   HCT 31.3 (L) 05/08/2021   MCV 85.3 05/08/2021   PLT 166 05/08/2021   NEUTROABS 1.7 05/08/2021    Imaging:  No results found.  Medications: I have reviewed the patient's current medications.  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), isolated mesenteric implant-resected, multiple tumor deposits Sigmoid/descending resection and creation of a descending colostomy 04/10/2016 MSI-stable, no loss of mismatch repair protein expression Foundation 1- BRAF V600E positive, MS-stable, intermediate tumor mutation burden, no RAS mutation CT abdomen/pelvis 04/02/2016-no evidence of distant metastatic disease Cycle 1 FOLFOX 05/21/2016 Cycle 2 FOLFOX 06/04/2016 Cycle 3 FOLFOX  06/18/2016 Cycle 4 FOLFOX 07/02/2016 (oxaliplatin held secondary to thrombocytopenia) Cycle 5 FOLFOX 07/16/2016 Cycle 6 FOLFOX 07/30/2016 Cycle 7 FOLFOX 08/13/2016 (oxaliplatin held and 5-FU dose reduced) Cycle 8 FOLFOX 09/03/2016 (oxaliplatin held secondary to neuropathy) Cycle 9 FOLFOX 09/17/2016 (oxaliplatin held secondary to neuropathy) Cycle 10 FOLFOX 10/02/2016 Cycle 11 FOLFOX 10/15/2016 (oxaliplatin eliminated from the regimen) Cycle 12 FOLFOX 10/30/2016 (oxaliplatin eliminated) CTs 05/12/2017-no evidence of recurrent disease, right inguinal hernia containing bladder Colonoscopy 07/21/2017, 3 polyps were removed from the descending and transverse colon, fragments of tubular and tubulovillous adenoma CTs 05/19/2018-no evidence of recurrent disease, status post hernia repair, right upper lobe pneumonia CTs 05/20/2019-resolution of right upper lobe pneumonia, no evidence of metastatic disease Colonoscopy 01/25/2020-multiple polyps removed-tubular adenomas, poor preparation, repeat colonoscopy recommended CTs neck, chest, and abdomen/pelvis 08/27/2020- new 2 centimeter left axillary node, new 1.9 cm left inguinal node chronic mildly prominent portacaval node Ultrasound biopsy of left inguinal node on 10/29/2020-metastatic adenocarcinoma consistent with colon adenocarcinoma PET [id number] hypermetabolic left inguinal and left axillary nodes, solitary focus of hypermetabolic activity in the right lower quadrant small bowel Cycle 1 FOLFOX 12/19/2020 Cycle 2 FOLFOX 01/02/2021, oxaliplatin dose reduced secondary to neutropenia and thrombocytopenia Cycle 3 FOLFOX 01/16/2021 Cycle 4 FOLFOX 01/30/2021 CTs 02/11/2021-no change in left inguinal and left axillary nodes, no evidence of disease progression, stable wall thickening involving a loop of distal ileum Cycle 1 FOLFIRI/Avastin 02/27/2021 Cycle 2 FOLFIRI/Avastin 03/13/2021 Cycle 3 FOLFIRI/Avastin 04/03/2021 Cycle 4 FOLFIRI 04/17/2021, Avastin held  due to elevated urine protein 05/07/2021-24-hour urine protein elevated 1562 Cycle 5 FOLFIRI 05/08/2021, Avastin held due to elevated urine protein, irinotecan dose reduced  due to in general not feeling well after treatment   2.   Chronic renal insufficiency   3.   Hypertension   4.  Inflamed sebaceous cyst at the upper back-status post incision and drainage   5.  Thrombocytopenia secondary chemotherapy-oxaliplatin dose reduced beginning with cycle 3 FOLFOX, oxaliplatin held with cycle 4 and cycle 7 FOLFOX   6.  Oxaliplatin neuropathy-improved   7.  History of Mucositis secondary chemotherapy   8.  Symptoms of pneumonia January 2020-CT 05/19/2018 consistent with right upper lobe pneumonia, Levaquin prescribed 05/20/2018   9.  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 appears unchanged.  He has completed 4 cycles of FOLFIRI/Avastin.  Avastin was held with cycle 4 due to elevated urine protein level.  24-hour urine protein completed, confirmed elevated.  Plan to proceed with FOLFIRI today as scheduled, hold Avastin.  Irinotecan will be dose reduced due to in general not feeling well after treatment, though he has no specific complaint.  Restaging CTs without contrast just prior to next office visit.  CBC and chemistry panel reviewed.  Labs adequate to proceed with treatment.  Stable elevation of creatinine.  He will return for follow-up in 2 weeks, CTs a few days prior.  He will contact the office in the interim with any problems.  Plan reviewed with Dr. Benay [name].   Ned Card ANP/GNP-BC   05/08/2021  9:36 AM

## 2021-05-08 NOTE — Progress Notes (Addendum)
Patient seen by Ned Card NP today  Vitals are within treatment parameters.  Labs reviewed by Ned Card NP and are not all within treatment parameters. Elevated urine protein level  Per physician team, patient is ready for treatment. Please note that modifications are being made to the treatment plan including hold avastin and irinotecan will be dose reduced

## 2021-05-08 NOTE — Progress Notes (Unsigned)
Notified patient of CT on 1/13 at [facility] at 0900. Arrival at [facility]. NPO 4 hours prior and oral contrast at 0700 and 0800. Confirmed he has contrast. Managed care notified for PA. Spoke with Jinny Blossom in [place] department due to difficulty putting in order regarding NO IV contrast. They will have CT tech change this comment, but it is clear that he is not to have IV contrast. Patient is also aware.

## 2021-05-10 ENCOUNTER — Other Ambulatory Visit: Payer: Self-pay

## 2021-05-10 ENCOUNTER — Inpatient Hospital Stay: Payer: Medicare Other

## 2021-05-10 VITALS — BP 146/74 | HR 57 | Temp 97.8°F | Resp 18

## 2021-05-10 DIAGNOSIS — C189 Malignant neoplasm of colon, unspecified: Secondary | ICD-10-CM

## 2021-05-10 DIAGNOSIS — Z5111 Encounter for antineoplastic chemotherapy: Secondary | ICD-10-CM | POA: Diagnosis not present

## 2021-05-10 DIAGNOSIS — D6959 Other secondary thrombocytopenia: Secondary | ICD-10-CM | POA: Diagnosis not present

## 2021-05-10 DIAGNOSIS — I10 Essential (primary) hypertension: Secondary | ICD-10-CM | POA: Diagnosis not present

## 2021-05-10 DIAGNOSIS — C774 Secondary and unspecified malignant neoplasm of inguinal and lower limb lymph nodes: Secondary | ICD-10-CM | POA: Diagnosis not present

## 2021-05-10 DIAGNOSIS — N189 Chronic kidney disease, unspecified: Secondary | ICD-10-CM | POA: Diagnosis not present

## 2021-05-10 DIAGNOSIS — C187 Malignant neoplasm of sigmoid colon: Secondary | ICD-10-CM | POA: Diagnosis not present

## 2021-05-10 MED ORDER — HEPARIN SOD (PORK) LOCK FLUSH 100 UNIT/ML IV SOLN
500.0000 [IU] | Freq: Once | INTRAVENOUS | Status: AC | PRN
  Administered 2021-05-10: 500 [IU]

## 2021-05-10 MED ORDER — SODIUM CHLORIDE 0.9% FLUSH
10.0000 mL | INTRAVENOUS | Status: DC | PRN
  Administered 2021-05-10: 10 mL

## 2021-05-13 DIAGNOSIS — N184 Chronic kidney disease, stage 4 (severe): Secondary | ICD-10-CM | POA: Diagnosis not present

## 2021-05-15 DIAGNOSIS — N184 Chronic kidney disease, stage 4 (severe): Secondary | ICD-10-CM | POA: Diagnosis not present

## 2021-05-17 ENCOUNTER — Other Ambulatory Visit: Payer: Self-pay

## 2021-05-17 ENCOUNTER — Ambulatory Visit (HOSPITAL_BASED_OUTPATIENT_CLINIC_OR_DEPARTMENT_OTHER)
Admission: RE | Admit: 2021-05-17 | Discharge: 2021-05-17 | Disposition: A | Discharge status: Home / Self Care | Payer: Medicare Other | Source: Ambulatory Visit | Attending: Nurse Practitioner | Admitting: Nurse Practitioner

## 2021-05-17 DIAGNOSIS — K5900 Constipation, unspecified: Secondary | ICD-10-CM | POA: Diagnosis not present

## 2021-05-17 DIAGNOSIS — R590 Localized enlarged lymph nodes: Secondary | ICD-10-CM | POA: Diagnosis not present

## 2021-05-17 DIAGNOSIS — C189 Malignant neoplasm of colon, unspecified: Secondary | ICD-10-CM | POA: Diagnosis not present

## 2021-05-17 DIAGNOSIS — N281 Cyst of kidney, acquired: Secondary | ICD-10-CM | POA: Diagnosis not present

## 2021-05-17 DIAGNOSIS — R918 Other nonspecific abnormal finding of lung field: Secondary | ICD-10-CM | POA: Diagnosis not present

## 2021-05-17 DIAGNOSIS — I712 Thoracic aortic aneurysm, without rupture, unspecified: Secondary | ICD-10-CM | POA: Diagnosis not present

## 2021-05-17 DIAGNOSIS — I700 Atherosclerosis of aorta: Secondary | ICD-10-CM | POA: Diagnosis not present

## 2021-05-19 ENCOUNTER — Other Ambulatory Visit: Payer: Self-pay | Admitting: Oncology

## 2021-05-22 ENCOUNTER — Other Ambulatory Visit: Payer: Self-pay

## 2021-05-22 ENCOUNTER — Inpatient Hospital Stay: Payer: Medicare Other

## 2021-05-22 ENCOUNTER — Inpatient Hospital Stay (HOSPITAL_BASED_OUTPATIENT_CLINIC_OR_DEPARTMENT_OTHER): Payer: Medicare Other | Admitting: Oncology

## 2021-05-22 ENCOUNTER — Encounter: Payer: Self-pay | Admitting: *Deleted

## 2021-05-22 VITALS — BP 141/66 | HR 78 | Temp 98.1°F | Resp 18 | Ht 76.0 in | Wt 245.0 lb

## 2021-05-22 DIAGNOSIS — C189 Malignant neoplasm of colon, unspecified: Secondary | ICD-10-CM

## 2021-05-22 DIAGNOSIS — I10 Essential (primary) hypertension: Secondary | ICD-10-CM | POA: Diagnosis not present

## 2021-05-22 DIAGNOSIS — C187 Malignant neoplasm of sigmoid colon: Secondary | ICD-10-CM | POA: Diagnosis not present

## 2021-05-22 DIAGNOSIS — D6959 Other secondary thrombocytopenia: Secondary | ICD-10-CM | POA: Diagnosis not present

## 2021-05-22 DIAGNOSIS — N189 Chronic kidney disease, unspecified: Secondary | ICD-10-CM | POA: Diagnosis not present

## 2021-05-22 DIAGNOSIS — Z5111 Encounter for antineoplastic chemotherapy: Secondary | ICD-10-CM | POA: Diagnosis not present

## 2021-05-22 DIAGNOSIS — C774 Secondary and unspecified malignant neoplasm of inguinal and lower limb lymph nodes: Secondary | ICD-10-CM | POA: Diagnosis not present

## 2021-05-22 LAB — CBC WITH DIFFERENTIAL (CANCER CENTER ONLY)
Abs Immature Granulocytes: 0.01 10*3/uL (ref 0.00–0.07)
Basophils Absolute: 0 10*3/uL (ref 0.0–0.1)
Basophils Relative: 1 %
Eosinophils Absolute: 0.3 10*3/uL (ref 0.0–0.5)
Eosinophils Relative: 7 %
HCT: 31 % — ABNORMAL LOW (ref 39.0–52.0)
Hemoglobin: 9.7 g/dL — ABNORMAL LOW (ref 13.0–17.0)
Immature Granulocytes: 0 %
Lymphocytes Relative: 25 %
Lymphs Abs: 1 10*3/uL (ref 0.7–4.0)
MCH: 26.5 pg (ref 26.0–34.0)
MCHC: 31.3 g/dL (ref 30.0–36.0)
MCV: 84.7 fL (ref 80.0–100.0)
Monocytes Absolute: 0.6 10*3/uL (ref 0.1–1.0)
Monocytes Relative: 14 %
Neutro Abs: 2.1 10*3/uL (ref 1.7–7.7)
Neutrophils Relative %: 53 %
Platelet Count: 140 10*3/uL — ABNORMAL LOW (ref 150–400)
RBC: 3.66 MIL/uL — ABNORMAL LOW (ref 4.22–5.81)
RDW: 20.2 % — ABNORMAL HIGH (ref 11.5–15.5)
WBC Count: 4.1 10*3/uL (ref 4.0–10.5)
nRBC: 0 % (ref 0.0–0.2)

## 2021-05-22 LAB — CMP (CANCER CENTER ONLY)
ALT: 10 U/L (ref 0–44)
AST: 9 U/L — ABNORMAL LOW (ref 15–41)
Albumin: 3.8 g/dL (ref 3.5–5.0)
Alkaline Phosphatase: 85 U/L (ref 38–126)
Anion gap: 9 (ref 5–15)
BUN: 30 mg/dL — ABNORMAL HIGH (ref 8–23)
CO2: 22 mmol/L (ref 22–32)
Calcium: 8.7 mg/dL — ABNORMAL LOW (ref 8.9–10.3)
Chloride: 108 mmol/L (ref 98–111)
Creatinine: 2.27 mg/dL — ABNORMAL HIGH (ref 0.61–1.24)
GFR, Estimated: 30 mL/min — ABNORMAL LOW (ref >60)
Glucose, Bld: 144 mg/dL — ABNORMAL HIGH (ref 70–99)
Potassium: 3.9 mmol/L (ref 3.5–5.1)
Sodium: 139 mmol/L (ref 135–145)
Total Bilirubin: 0.3 mg/dL (ref 0.3–1.2)
Total Protein: 6.5 g/dL (ref 6.5–8.1)

## 2021-05-22 LAB — TOTAL PROTEIN, URINE DIPSTICK: Protein, ur: 30 mg/dL — AB

## 2021-05-22 MED ORDER — SODIUM CHLORIDE 0.9 % IV SOLN
120.0000 mg/m2 | Freq: Once | INTRAVENOUS | Status: AC
  Administered 2021-05-22: 300 mg via INTRAVENOUS
  Filled 2021-05-22: qty 15

## 2021-05-22 MED ORDER — SODIUM CHLORIDE 0.9 % IV SOLN
1800.0000 mg/m2 | INTRAVENOUS | Status: DC
  Administered 2021-05-22: 4450 mg via INTRAVENOUS
  Filled 2021-05-22: qty 89

## 2021-05-22 MED ORDER — SODIUM CHLORIDE 0.9 % IV SOLN
10.0000 mg | Freq: Once | INTRAVENOUS | Status: AC
  Administered 2021-05-22: 10 mg via INTRAVENOUS
  Filled 2021-05-22: qty 10

## 2021-05-22 MED ORDER — SODIUM CHLORIDE 0.9 % IV SOLN: Freq: Once | INTRAVENOUS | Status: AC

## 2021-05-22 MED ORDER — SODIUM CHLORIDE 0.9 % IV SOLN
300.0000 mg/m2 | Freq: Once | INTRAVENOUS | Status: AC
  Administered 2021-05-22: 738 mg via INTRAVENOUS
  Filled 2021-05-22: qty 25

## 2021-05-22 MED ORDER — FLUOROURACIL CHEMO INJECTION 2.5 GM/50ML
300.0000 mg/m2 | Freq: Once | INTRAVENOUS | Status: AC
  Administered 2021-05-22: 750 mg via INTRAVENOUS
  Filled 2021-05-22: qty 15

## 2021-05-22 MED ORDER — ATROPINE SULFATE 1 MG/ML IV SOLN
0.5000 mg | Freq: Once | INTRAVENOUS | Status: AC | PRN
  Administered 2021-05-22: 0.5 mg via INTRAVENOUS
  Filled 2021-05-22: qty 1

## 2021-05-22 MED ORDER — PALONOSETRON HCL INJECTION 0.25 MG/5ML
0.2500 mg | Freq: Once | INTRAVENOUS | Status: AC
  Administered 2021-05-22: 0.25 mg via INTRAVENOUS
  Filled 2021-05-22: qty 5

## 2021-05-22 MED ORDER — SODIUM CHLORIDE 0.9 % IV SOLN
150.0000 mg | Freq: Once | INTRAVENOUS | Status: AC
  Administered 2021-05-22: 150 mg via INTRAVENOUS
  Filled 2021-05-22: qty 150

## 2021-05-22 NOTE — Progress Notes (Signed)
Patient presents for treatment. RN assessment completed along with the following:  Labs/vitals reviewed - Yes, and Please see note from Susan-C, RN    Weight within 10% of previous measurement - Yes Informed consent completed and reflects current therapy/intent - Yes, on date 02/27/2021             Provider progress note reviewed - Today's provider note is not yet available. I reviewed the most recent oncology provider progress note in chart dated 05/08/2021. Treatment/Antibody/Supportive plan reviewed - Yes, and there are no adjustments needed for today's treatment. S&H and other orders reviewed - Yes, and there are no additional orders identified. Previous treatment date reviewed - Yes, and the appropriate amount of time has elapsed between treatments. Clinic Hand Off Received from - Yes from [facility], RN  Patient to proceed with treatment.

## 2021-05-22 NOTE — Progress Notes (Signed)
[facility] OFFICE PROGRESS NOTE   Diagnosis: Colon cancer  INTERVAL HISTORY:   Louis [name] completed on cycle FOLFIRI on 05/08/2021.  He reports diarrhea over the 2 weeks following chemotherapy.  The diarrhea is relieved with Imodium, but returns after 2 or 3 days.  He has malaise.  No other complaint.  Objective:  Vital signs in last 24 hours:  Blood pressure (!) 141/66, pulse 78, temperature 98.1 F (36.7 C), temperature source Oral, resp. rate 18, height '6\' 4"'  (1.93 m), weight 245 lb (111.1 kg), SpO2 100 %.    HEENT: No thrush or ulcers Lymphatics: 1.5 cm left axillary node, 3-4 cm left inguinal node Resp: Lungs clear bilaterally Cardio: Regular rate and rhythm GI: Nontender, no hepatosplenomegaly, no mass Vascular: No leg edema   Portacath/PICC-without erythema  Lab Results:  Lab Results  Component Value Date   WBC 4.1 05/22/2021   HGB 9.7 (L) 05/22/2021   HCT 31.0 (L) 05/22/2021   MCV 84.7 05/22/2021   PLT 140 (L) 05/22/2021   NEUTROABS 2.1 05/22/2021    CMP  Lab Results  Component Value Date   NA 139 05/08/2021   K 4.2 05/08/2021   CL 108 05/08/2021   CO2 24 05/08/2021   GLUCOSE 146 (H) 05/08/2021   BUN 28 (H) 05/08/2021   CREATININE 2.42 (H) 05/08/2021   CALCIUM 8.8 (L) 05/08/2021   PROT 6.5 05/08/2021   ALBUMIN 3.8 05/08/2021   AST 10 (L) 05/08/2021   ALT 10 05/08/2021   ALKPHOS 82 05/08/2021   BILITOT 0.3 05/08/2021   GFRNONAA 27 (L) 05/08/2021   GFRAA 28 (L) 09/20/2019    Lab Results  Component Value Date   CEA1 2.61 01/02/2021   CEA 3.76 01/02/2021   Medications: I have reviewed the patient's current medications.   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), isolated mesenteric implant-resected, multiple tumor deposits Sigmoid/descending resection and creation of a descending colostomy 04/10/2016 MSI-stable, no loss of mismatch repair protein expression Foundation 1- BRAF V600E positive, MS-stable,  intermediate tumor mutation burden, no RAS mutation CT abdomen/pelvis 04/02/2016-no evidence of distant metastatic disease Cycle 1 FOLFOX 05/21/2016 Cycle 2 FOLFOX 06/04/2016 Cycle 3 FOLFOX 06/18/2016 Cycle 4 FOLFOX 07/02/2016 (oxaliplatin held secondary to thrombocytopenia) Cycle 5 FOLFOX 07/16/2016 Cycle 6 FOLFOX 07/30/2016 Cycle 7 FOLFOX 08/13/2016 (oxaliplatin held and 5-FU dose reduced) Cycle 8 FOLFOX 09/03/2016 (oxaliplatin held secondary to neuropathy) Cycle 9 FOLFOX 09/17/2016 (oxaliplatin held secondary to neuropathy) Cycle 10 FOLFOX 10/02/2016 Cycle 11 FOLFOX 10/15/2016 (oxaliplatin eliminated from the regimen) Cycle 12 FOLFOX 10/30/2016 (oxaliplatin eliminated) CTs 05/12/2017-no evidence of recurrent disease, right inguinal hernia containing bladder Colonoscopy 07/21/2017, 3 polyps were removed from the descending and transverse colon, fragments of tubular and tubulovillous adenoma CTs 05/19/2018-no evidence of recurrent disease, status post hernia repair, right upper lobe pneumonia CTs 05/20/2019-resolution of right upper lobe pneumonia, no evidence of metastatic disease Colonoscopy 01/25/2020-multiple polyps removed-tubular adenomas, poor preparation, repeat colonoscopy recommended CTs neck, chest, and abdomen/pelvis 08/27/2020- new 2 centimeter left axillary node, new 1.9 cm left inguinal node chronic mildly prominent portacaval node Ultrasound biopsy of left inguinal node on 10/29/2020-metastatic adenocarcinoma consistent with colon adenocarcinoma PET [id number] hypermetabolic left inguinal and left axillary nodes, solitary focus of hypermetabolic activity in the right lower quadrant small bowel Cycle 1 FOLFOX 12/19/2020 Cycle 2 FOLFOX 01/02/2021, oxaliplatin dose reduced secondary to neutropenia and thrombocytopenia Cycle 3 FOLFOX 01/16/2021 Cycle 4 FOLFOX 01/30/2021 CTs 02/11/2021-no change in left inguinal and left axillary nodes, no evidence of disease  progression, stable  wall thickening involving a loop of distal ileum Cycle 1 FOLFIRI/Avastin 02/27/2021 Cycle 2 FOLFIRI/Avastin 03/13/2021 Cycle 3 FOLFIRI/Avastin 04/03/2021 Cycle 4 FOLFIRI 04/17/2021, Avastin held due to elevated urine protein 05/07/2021-24-hour urine protein elevated 1562 Cycle 5 FOLFIRI 05/08/2021, Avastin held due to elevated urine protein, irinotecan dose reduced due to in general not feeling well after treatment CTs 05/17/2021-left axillary and left inguinal lymph nodes are smaller, no evidence of disease progression Cycle 6 FOLFIRI 05/22/2021, Avastin held due to proteinuria   2.   Chronic renal insufficiency   3.   Hypertension   4.  Inflamed sebaceous cyst at the upper back-status post incision and drainage   5.  Thrombocytopenia secondary chemotherapy-oxaliplatin dose reduced beginning with cycle 3 FOLFOX, oxaliplatin held with cycle 4 and cycle 7 FOLFOX   6.  Oxaliplatin neuropathy-improved   7.  History of Mucositis secondary chemotherapy   8.  Symptoms of pneumonia January 2020-CT 05/19/2018 consistent with right upper lobe pneumonia, Levaquin prescribed 05/20/2018   9.  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  He has completed 5 cycles of FOLFIRI.  Avastin was held with cycle 5 due to proteinuria.  The urine protein dipstick is lower today.  He will return a 24-hour urine protein next week.  The restaging CTs reveal no evidence of disease progression.  The left axillary and inguinal lymph nodes are smaller.  I cannot appreciate an area of thickening in the right abdomen small bowel.  The plan is to continue FOLFIRI.  Avastin will be added back to the chemotherapy regimen with cycle 7 if the 24-hour urine protein is better.  I will present his case at the GI tumor conference with attention to the right lower abdomen small bowel lesion.  Betsy Coder, MD  05/22/2021  8:58 AM

## 2021-05-22 NOTE — Patient Instructions (Signed)
[facility]   Discharge Instructions: Thank you for choosing [facility] to provide your oncology and hematology care.   If you have a lab appointment with the [facility], please go directly to the [facility] and check in at the registration area.   Wear comfortable clothing and clothing appropriate for easy access to any Portacath or PICC line.   We strive to give you quality time with your provider. You may need to reschedule your appointment if you arrive late (15 or more minutes).  Arriving late affects you and other patients whose appointments are after yours.  Also, if you miss three or more appointments without notifying the office, you may be dismissed from the clinic at the providers discretion.      For prescription refill requests, have your pharmacy contact our office and allow 72 hours for refills to be completed.    Today you received the following chemotherapy and/or immunotherapy agents Irinotecan (CAMPTOSAR), Leucovorin & Flourouracil (ADRUCIL).      To help prevent nausea and vomiting after your treatment, we encourage you to take your nausea medication as directed.  BELOW ARE SYMPTOMS THAT SHOULD BE REPORTED IMMEDIATELY: *FEVER GREATER THAN 100.4 F (38 C) OR HIGHER *CHILLS OR SWEATING *NAUSEA AND VOMITING THAT IS NOT CONTROLLED WITH YOUR NAUSEA MEDICATION *UNUSUAL SHORTNESS OF BREATH *UNUSUAL BRUISING OR BLEEDING *URINARY PROBLEMS (pain or burning when urinating, or frequent urination) *BOWEL PROBLEMS (unusual diarrhea, constipation, pain near the anus) TENDERNESS IN MOUTH AND THROAT WITH OR WITHOUT PRESENCE OF ULCERS (sore throat, sores in mouth, or a toothache) UNUSUAL RASH, SWELLING OR PAIN  UNUSUAL VAGINAL DISCHARGE OR ITCHING   Items with * indicate a potential emergency and should be followed up as soon as possible or go to the Emergency Department if any problems should occur.  Please show the CHEMOTHERAPY ALERT  CARD or IMMUNOTHERAPY ALERT CARD at check-in to the Emergency Department and triage nurse.  Should you have questions after your visit or need to cancel or reschedule your appointment, please contact [facility]  Dept: [phone]  and follow the prompts.  Office hours are 8:00 a.m. to 4:30 p.m. Monday - Friday. Please note that voicemails left after 4:00 p.m. may not be returned until the following business day.  We are closed weekends and major holidays. You have access to a nurse at all times for urgent questions. Please call the main number to the clinic Dept: [phone] and follow the prompts.   For any non-urgent questions, you may also contact your provider using MyChart. We now offer e-Visits for anyone 66 and older to request care online for non-urgent symptoms. For details visit [email or web address].   Also download the MyChart app! Go to the app store, search "MyChart", open the app, select [facility], and log in with your MyChart username and password.  Due to Covid, a mask is required upon entering the hospital/clinic. If you do not have a mask, one will be given to you upon arrival. For doctor visits, patients may have 1 support person aged 29 or older with them. For treatment visits, patients cannot have anyone with them due to current Covid guidelines and our immunocompromised population.   Irinotecan injection What is this medication? IRINOTECAN (ir in oh TEE kan ) is a chemotherapy drug. It is used to treat colon and rectal cancer. This medicine may be used for other purposes; ask your health care provider or pharmacist if you  have questions. COMMON BRAND NAME(S): Camptosar What should I tell my care team before I take this medication? They need to know if you have any of these conditions: dehydration diarrhea infection (especially a virus infection such as chickenpox, cold sores, or herpes) liver disease low blood counts, like low white cell,  platelet, or red cell counts low levels of calcium, magnesium, or potassium in the blood recent or ongoing radiation therapy an unusual or allergic reaction to irinotecan, other medicines, foods, dyes, or preservatives pregnant or trying to get pregnant breast-feeding How should I use this medication? This drug is given as an infusion into a vein. It is administered in a hospital or clinic by a specially trained health care professional. Talk to your pediatrician regarding the use of this medicine in children. Special care may be needed. Overdosage: If you think you have taken too much of this medicine contact a poison control center or emergency room at once. NOTE: This medicine is only for you. Do not share this medicine with others. What if I miss a dose? It is important not to miss your dose. Call your doctor or health care professional if you are unable to keep an appointment. What may interact with this medication? Do not take this medicine with any of the following medications: cobicistat itraconazole This medicine may interact with the following medications: antiviral medicines for HIV or AIDS certain antibiotics like rifampin or rifabutin certain medicines for fungal infections like ketoconazole, posaconazole, and voriconazole certain medicines for seizures like carbamazepine, phenobarbital, phenotoin clarithromycin gemfibrozil nefazodone [facility]. John's Wort This list may not describe all possible interactions. Give your health care provider a list of all the medicines, herbs, non-prescription drugs, or dietary supplements you use. Also tell them if you smoke, drink alcohol, or use illegal drugs. Some items may interact with your medicine. What should I watch for while using this medication? Your condition will be monitored carefully while you are receiving this medicine. You will need important blood work done while you are taking this medicine. This drug may make you feel  generally unwell. This is not uncommon, as chemotherapy can affect healthy cells as well as cancer cells. Report any side effects. Continue your course of treatment even though you feel ill unless your doctor tells you to stop. In some cases, you may be given additional medicines to help with side effects. Follow all directions for their use. You may get drowsy or dizzy. Do not drive, use machinery, or do anything that needs mental alertness until you know how this medicine affects you. Do not stand or sit up quickly, especially if you are an older patient. This reduces the risk of dizzy or fainting spells. Call your health care professional for advice if you get a fever, chills, or sore throat, or other symptoms of a cold or flu. Do not treat yourself. This medicine decreases your body's ability to fight infections. Try to avoid being around people who are sick. Avoid taking products that contain aspirin, acetaminophen, ibuprofen, naproxen, or ketoprofen unless instructed by your doctor. These medicines may hide a fever. This medicine may increase your risk to bruise or bleed. Call your doctor or health care professional if you notice any unusual bleeding. Be careful brushing and flossing your teeth or using a toothpick because you may get an infection or bleed more easily. If you have any dental work done, tell your dentist you are receiving this medicine. Do not become pregnant while taking this medicine or for  6 months after stopping it. Women should inform their health care professional if they wish to become pregnant or think they might be pregnant. Men should not father a child while taking this medicine and for 3 months after stopping it. There is potential for serious side effects to an unborn child. Talk to your health care professional for more information. Do not breast-feed an infant while taking this medicine or for 7 days after stopping it. This medicine has caused ovarian failure in some  women. This medicine may make it more difficult to get pregnant. Talk to your health care professional if you are concerned about your fertility. This medicine has caused decreased sperm counts in some men. This may make it more difficult to father a child. Talk to your health care professional if you are concerned about your fertility. What side effects may I notice from receiving this medication? Side effects that you should report to your doctor or health care professional as soon as possible: allergic reactions like skin rash, itching or hives, swelling of the face, lips, or tongue chest pain diarrhea flushing, runny nose, sweating during infusion low blood counts - this medicine may decrease the number of white blood cells, red blood cells and platelets. You may be at increased risk for infections and bleeding. nausea, vomiting pain, swelling, warmth in the leg signs of decreased platelets or bleeding - bruising, pinpoint red spots on the skin, black, tarry stools, blood in the urine signs of infection - fever or chills, cough, sore throat, pain or difficulty passing urine signs of decreased red blood cells - unusually weak or tired, fainting spells, lightheadedness Side effects that usually do not require medical attention (report to your doctor or health care professional if they continue or are bothersome): constipation hair loss headache loss of appetite mouth sores stomach pain This list may not describe all possible side effects. Call your doctor for medical advice about side effects. You may report side effects to FDA at 1-800-FDA-1088. Where should I keep my medication? This drug is given in a hospital or clinic and will not be stored at home. NOTE: This sheet is a summary. It may not cover all possible information. If you have questions about this medicine, talk to your doctor, pharmacist, or health care provider.  2022 Elsevier/Gold Standard (2021-01-08  00:00:00)  Leucovorin injection What is this medication? LEUCOVORIN (loo koe VOR in) is used to prevent or treat the harmful effects of some medicines. This medicine is used to treat anemia caused by a low amount of folic acid in the body. It is also used with 5-fluorouracil (5-FU) to treat colon cancer. This medicine may be used for other purposes; ask your health care provider or pharmacist if you have questions. What should I tell my care team before I take this medication? They need to know if you have any of these conditions: anemia from low levels of vitamin B-12 in the blood an unusual or allergic reaction to leucovorin, folic acid, other medicines, foods, dyes, or preservatives pregnant or trying to get pregnant breast-feeding How should I use this medication? This medicine is for injection into a muscle or into a vein. It is given by a health care professional in a hospital or clinic setting. Talk to your pediatrician regarding the use of this medicine in children. Special care may be needed. Overdosage: If you think you have taken too much of this medicine contact a poison control center or emergency room at once. NOTE: This  medicine is only for you. Do not share this medicine with others. What if I miss a dose? This does not apply. What may interact with this medication? capecitabine fluorouracil phenobarbital phenytoin primidone trimethoprim-sulfamethoxazole This list may not describe all possible interactions. Give your health care provider a list of all the medicines, herbs, non-prescription drugs, or dietary supplements you use. Also tell them if you smoke, drink alcohol, or use illegal drugs. Some items may interact with your medicine. What should I watch for while using this medication? Your condition will be monitored carefully while you are receiving this medicine. This medicine may increase the side effects of 5-fluorouracil, 5-FU. Tell your doctor or health care  professional if you have diarrhea or mouth sores that do not get better or that get worse. What side effects may I notice from receiving this medication? Side effects that you should report to your doctor or health care professional as soon as possible: allergic reactions like skin rash, itching or hives, swelling of the face, lips, or tongue breathing problems fever, infection mouth sores unusual bleeding or bruising unusually weak or tired Side effects that usually do not require medical attention (report to your doctor or health care professional if they continue or are bothersome): constipation or diarrhea loss of appetite nausea, vomiting This list may not describe all possible side effects. Call your doctor for medical advice about side effects. You may report side effects to FDA at 1-800-FDA-1088. Where should I keep my medication? This drug is given in a hospital or clinic and will not be stored at home. NOTE: This sheet is a summary. It may not cover all possible information. If you have questions about this medicine, talk to your doctor, pharmacist, or health care provider.  2022 Elsevier/Gold Standard (2007-10-28 00:00:00)  Fluorouracil, 5-FU injection What is this medication? FLUOROURACIL, 5-FU (flure oh YOOR a sil) is a chemotherapy drug. It slows the growth of cancer cells. This medicine is used to treat many types of cancer like breast cancer, colon or rectal cancer, pancreatic cancer, and stomach cancer. This medicine may be used for other purposes; ask your health care provider or pharmacist if you have questions. COMMON BRAND NAME(S): Adrucil What should I tell my care team before I take this medication? They need to know if you have any of these conditions: blood disorders dihydropyrimidine dehydrogenase (DPD) deficiency infection (especially a virus infection such as chickenpox, cold sores, or herpes) kidney disease liver disease malnourished, poor  nutrition recent or ongoing radiation therapy an unusual or allergic reaction to fluorouracil, other chemotherapy, other medicines, foods, dyes, or preservatives pregnant or trying to get pregnant breast-feeding How should I use this medication? This drug is given as an infusion or injection into a vein. It is administered in a hospital or clinic by a specially trained health care professional. Talk to your pediatrician regarding the use of this medicine in children. Special care may be needed. Overdosage: If you think you have taken too much of this medicine contact a poison control center or emergency room at once. NOTE: This medicine is only for you. Do not share this medicine with others. What if I miss a dose? It is important not to miss your dose. Call your doctor or health care professional if you are unable to keep an appointment. What may interact with this medication? Do not take this medicine with any of the following medications: live virus vaccines This medicine may also interact with the following medications: medicines that treat  or prevent blood clots like warfarin, enoxaparin, and dalteparin This list may not describe all possible interactions. Give your health care provider a list of all the medicines, herbs, non-prescription drugs, or dietary supplements you use. Also tell them if you smoke, drink alcohol, or use illegal drugs. Some items may interact with your medicine. What should I watch for while using this medication? Visit your doctor for checks on your progress. This drug may make you feel generally unwell. This is not uncommon, as chemotherapy can affect healthy cells as well as cancer cells. Report any side effects. Continue your course of treatment even though you feel ill unless your doctor tells you to stop. In some cases, you may be given additional medicines to help with side effects. Follow all directions for their use. Call your doctor or health care  professional for advice if you get a fever, chills or sore throat, or other symptoms of a cold or flu. Do not treat yourself. This drug decreases your body's ability to fight infections. Try to avoid being around people who are sick. This medicine may increase your risk to bruise or bleed. Call your doctor or health care professional if you notice any unusual bleeding. Be careful brushing and flossing your teeth or using a toothpick because you may get an infection or bleed more easily. If you have any dental work done, tell your dentist you are receiving this medicine. Avoid taking products that contain aspirin, acetaminophen, ibuprofen, naproxen, or ketoprofen unless instructed by your doctor. These medicines may hide a fever. Do not become pregnant while taking this medicine. Women should inform their doctor if they wish to become pregnant or think they might be pregnant. There is a potential for serious side effects to an unborn child. Talk to your health care professional or pharmacist for more information. Do not breast-feed an infant while taking this medicine. Men should inform their doctor if they wish to father a child. This medicine may lower sperm counts. Do not treat diarrhea with over the counter products. Contact your doctor if you have diarrhea that lasts more than 2 days or if it is severe and watery. This medicine can make you more sensitive to the sun. Keep out of the sun. If you cannot avoid being in the sun, wear protective clothing and use sunscreen. Do not use sun lamps or tanning beds/booths. What side effects may I notice from receiving this medication? Side effects that you should report to your doctor or health care professional as soon as possible: allergic reactions like skin rash, itching or hives, swelling of the face, lips, or tongue low blood counts - this medicine may decrease the number of white blood cells, red blood cells and platelets. You may be at increased risk for  infections and bleeding. signs of infection - fever or chills, cough, sore throat, pain or difficulty passing urine signs of decreased platelets or bleeding - bruising, pinpoint red spots on the skin, black, tarry stools, blood in the urine signs of decreased red blood cells - unusually weak or tired, fainting spells, lightheadedness breathing problems changes in vision chest pain mouth sores nausea and vomiting pain, swelling, redness at site where injected pain, tingling, numbness in the hands or feet redness, swelling, or sores on hands or feet stomach pain unusual bleeding Side effects that usually do not require medical attention (report to your doctor or health care professional if they continue or are bothersome): changes in finger or toe nails diarrhea dry or  itchy skin hair loss headache loss of appetite sensitivity of eyes to the light stomach upset unusually teary eyes This list may not describe all possible side effects. Call your doctor for medical advice about side effects. You may report side effects to FDA at 1-800-FDA-1088. Where should I keep my medication? This drug is given in a hospital or clinic and will not be stored at home. NOTE: This sheet is a summary. It may not cover all possible information. If you have questions about this medicine, talk to your doctor, pharmacist, or health care provider.  2022 Elsevier/Gold Standard (2021-01-08 00:00:00)  The chemotherapy medication bag should finish at 46 hours, 96 hours, or 7 days. For example, if your pump is scheduled for 46 hours and it was put on at 4:00 p.m., it should finish at 2:00 p.m. the day it is scheduled to come off regardless of your appointment time.     Estimated time to finish at 11:00 a.m. on Friday 05/24/2021.   If the display on your pump reads "Low Volume" and it is beeping, take the batteries out of the pump and come to the cancer center for it to be taken off.   If the pump alarms go off  prior to the pump reading "Low Volume" then call [phone] and someone can assist you.  If the plunger comes out and the chemotherapy medication is leaking out, please use your home chemo spill kit to clean up the spill. Do NOT use paper towels or other household products.  If you have problems or questions regarding your pump, please call either [phone] (24 hours a day) or the cancer center Monday-Friday 8:00 a.m.- 4:30 p.m. at the clinic number and we will assist you. If you are unable to get assistance, then go to the nearest Emergency Department and ask the staff to contact the IV team for assistance.

## 2021-05-22 NOTE — Progress Notes (Signed)
Patient seen by Dr. Benay Spice today  Vitals are within treatment parameters.  Labs reviewed by Dr. Benay Spice and are not all within treatment parameters. OK to treat w/creatinine 2.27 (his usual range)  Per physician team, patient is ready for treatment. Please note that modifications are being made to the treatment plan including HOLD Avastin today and will collect 24 hour urine for next visit.

## 2021-05-24 ENCOUNTER — Inpatient Hospital Stay: Payer: Medicare Other

## 2021-05-24 ENCOUNTER — Other Ambulatory Visit: Payer: Self-pay

## 2021-05-24 VITALS — BP 148/75 | HR 62 | Temp 98.7°F | Resp 20

## 2021-05-24 DIAGNOSIS — I10 Essential (primary) hypertension: Secondary | ICD-10-CM | POA: Diagnosis not present

## 2021-05-24 DIAGNOSIS — Z5111 Encounter for antineoplastic chemotherapy: Secondary | ICD-10-CM | POA: Diagnosis not present

## 2021-05-24 DIAGNOSIS — C774 Secondary and unspecified malignant neoplasm of inguinal and lower limb lymph nodes: Secondary | ICD-10-CM | POA: Diagnosis not present

## 2021-05-24 DIAGNOSIS — C187 Malignant neoplasm of sigmoid colon: Secondary | ICD-10-CM | POA: Diagnosis not present

## 2021-05-24 DIAGNOSIS — N189 Chronic kidney disease, unspecified: Secondary | ICD-10-CM | POA: Diagnosis not present

## 2021-05-24 DIAGNOSIS — C189 Malignant neoplasm of colon, unspecified: Secondary | ICD-10-CM

## 2021-05-24 DIAGNOSIS — D6959 Other secondary thrombocytopenia: Secondary | ICD-10-CM | POA: Diagnosis not present

## 2021-05-24 MED ORDER — SODIUM CHLORIDE 0.9% FLUSH
10.0000 mL | INTRAVENOUS | Status: DC | PRN
  Administered 2021-05-24: 10 mL

## 2021-05-24 MED ORDER — HEPARIN SOD (PORK) LOCK FLUSH 100 UNIT/ML IV SOLN
500.0000 [IU] | Freq: Once | INTRAVENOUS | Status: AC | PRN
  Administered 2021-05-24: 500 [IU]

## 2021-05-28 DIAGNOSIS — I129 Hypertensive chronic kidney disease with stage 1 through stage 4 chronic kidney disease, or unspecified chronic kidney disease: Secondary | ICD-10-CM | POA: Diagnosis not present

## 2021-05-28 DIAGNOSIS — E669 Obesity, unspecified: Secondary | ICD-10-CM | POA: Diagnosis not present

## 2021-05-28 DIAGNOSIS — E785 Hyperlipidemia, unspecified: Secondary | ICD-10-CM | POA: Diagnosis not present

## 2021-05-28 DIAGNOSIS — N2581 Secondary hyperparathyroidism of renal origin: Secondary | ICD-10-CM | POA: Diagnosis not present

## 2021-05-28 DIAGNOSIS — N179 Acute kidney failure, unspecified: Secondary | ICD-10-CM | POA: Diagnosis not present

## 2021-05-28 DIAGNOSIS — N1832 Chronic kidney disease, stage 3b: Secondary | ICD-10-CM | POA: Diagnosis not present

## 2021-05-28 DIAGNOSIS — C187 Malignant neoplasm of sigmoid colon: Secondary | ICD-10-CM | POA: Diagnosis not present

## 2021-05-31 ENCOUNTER — Other Ambulatory Visit: Payer: Self-pay | Admitting: Oncology

## 2021-06-03 DIAGNOSIS — Z5111 Encounter for antineoplastic chemotherapy: Secondary | ICD-10-CM | POA: Diagnosis not present

## 2021-06-03 DIAGNOSIS — I10 Essential (primary) hypertension: Secondary | ICD-10-CM | POA: Diagnosis not present

## 2021-06-03 DIAGNOSIS — C774 Secondary and unspecified malignant neoplasm of inguinal and lower limb lymph nodes: Secondary | ICD-10-CM | POA: Diagnosis not present

## 2021-06-03 DIAGNOSIS — C187 Malignant neoplasm of sigmoid colon: Secondary | ICD-10-CM | POA: Diagnosis not present

## 2021-06-03 DIAGNOSIS — D6959 Other secondary thrombocytopenia: Secondary | ICD-10-CM | POA: Diagnosis not present

## 2021-06-03 DIAGNOSIS — N189 Chronic kidney disease, unspecified: Secondary | ICD-10-CM | POA: Diagnosis not present

## 2021-06-04 ENCOUNTER — Other Ambulatory Visit (HOSPITAL_BASED_OUTPATIENT_CLINIC_OR_DEPARTMENT_OTHER): Payer: Self-pay

## 2021-06-04 ENCOUNTER — Other Ambulatory Visit: Payer: Self-pay

## 2021-06-04 DIAGNOSIS — C189 Malignant neoplasm of colon, unspecified: Secondary | ICD-10-CM

## 2021-06-05 ENCOUNTER — Inpatient Hospital Stay: Payer: Medicare Other

## 2021-06-05 ENCOUNTER — Encounter: Payer: Self-pay | Admitting: Oncology

## 2021-06-05 ENCOUNTER — Other Ambulatory Visit: Payer: Self-pay

## 2021-06-05 ENCOUNTER — Inpatient Hospital Stay (HOSPITAL_BASED_OUTPATIENT_CLINIC_OR_DEPARTMENT_OTHER): Payer: Medicare Other | Admitting: Oncology

## 2021-06-05 ENCOUNTER — Inpatient Hospital Stay: Payer: Medicare Other | Attending: Oncology

## 2021-06-05 VITALS — BP 124/67 | HR 81 | Temp 97.7°F | Resp 18 | Ht 76.0 in | Wt 240.2 lb

## 2021-06-05 DIAGNOSIS — C774 Secondary and unspecified malignant neoplasm of inguinal and lower limb lymph nodes: Secondary | ICD-10-CM | POA: Diagnosis not present

## 2021-06-05 DIAGNOSIS — Z452 Encounter for adjustment and management of vascular access device: Secondary | ICD-10-CM | POA: Insufficient documentation

## 2021-06-05 DIAGNOSIS — D6959 Other secondary thrombocytopenia: Secondary | ICD-10-CM | POA: Diagnosis not present

## 2021-06-05 DIAGNOSIS — C189 Malignant neoplasm of colon, unspecified: Secondary | ICD-10-CM

## 2021-06-05 DIAGNOSIS — Z5111 Encounter for antineoplastic chemotherapy: Secondary | ICD-10-CM | POA: Insufficient documentation

## 2021-06-05 DIAGNOSIS — N189 Chronic kidney disease, unspecified: Secondary | ICD-10-CM | POA: Insufficient documentation

## 2021-06-05 DIAGNOSIS — C187 Malignant neoplasm of sigmoid colon: Secondary | ICD-10-CM | POA: Diagnosis not present

## 2021-06-05 DIAGNOSIS — G620 Drug-induced polyneuropathy: Secondary | ICD-10-CM | POA: Insufficient documentation

## 2021-06-05 LAB — CBC WITH DIFFERENTIAL (CANCER CENTER ONLY)
Abs Immature Granulocytes: 0.01 10*3/uL (ref 0.00–0.07)
Basophils Absolute: 0 10*3/uL (ref 0.0–0.1)
Basophils Relative: 1 %
Eosinophils Absolute: 0.2 10*3/uL (ref 0.0–0.5)
Eosinophils Relative: 6 %
HCT: 29.9 % — ABNORMAL LOW (ref 39.0–52.0)
Hemoglobin: 9.3 g/dL — ABNORMAL LOW (ref 13.0–17.0)
Immature Granulocytes: 0 %
Lymphocytes Relative: 24 %
Lymphs Abs: 0.8 10*3/uL (ref 0.7–4.0)
MCH: 26.3 pg (ref 26.0–34.0)
MCHC: 31.1 g/dL (ref 30.0–36.0)
MCV: 84.7 fL (ref 80.0–100.0)
Monocytes Absolute: 0.4 10*3/uL (ref 0.1–1.0)
Monocytes Relative: 13 %
Neutro Abs: 1.9 10*3/uL (ref 1.7–7.7)
Neutrophils Relative %: 56 %
Platelet Count: 87 10*3/uL — ABNORMAL LOW (ref 150–400)
RBC: 3.53 MIL/uL — ABNORMAL LOW (ref 4.22–5.81)
RDW: 20.1 % — ABNORMAL HIGH (ref 11.5–15.5)
WBC Count: 3.3 10*3/uL — ABNORMAL LOW (ref 4.0–10.5)
nRBC: 0 % (ref 0.0–0.2)

## 2021-06-05 LAB — CMP (CANCER CENTER ONLY)
ALT: 9 U/L (ref 0–44)
AST: 12 U/L — ABNORMAL LOW (ref 15–41)
Albumin: 3.5 g/dL (ref 3.5–5.0)
Alkaline Phosphatase: 73 U/L (ref 38–126)
Anion gap: 9 (ref 5–15)
BUN: 41 mg/dL — ABNORMAL HIGH (ref 8–23)
CO2: 21 mmol/L — ABNORMAL LOW (ref 22–32)
Calcium: 8.7 mg/dL — ABNORMAL LOW (ref 8.9–10.3)
Chloride: 110 mmol/L (ref 98–111)
Creatinine: 2.93 mg/dL — ABNORMAL HIGH (ref 0.61–1.24)
GFR, Estimated: 22 mL/min — ABNORMAL LOW (ref >60)
Glucose, Bld: 168 mg/dL — ABNORMAL HIGH (ref 70–99)
Potassium: 4.3 mmol/L (ref 3.5–5.1)
Sodium: 140 mmol/L (ref 135–145)
Total Bilirubin: 0.3 mg/dL (ref 0.3–1.2)
Total Protein: 6.4 g/dL — ABNORMAL LOW (ref 6.5–8.1)

## 2021-06-05 LAB — PROTEIN, URINE, 24 HOUR
Collection Interval-UPROT: 24 hours
Protein, 24H Urine: 1917 mg/d — ABNORMAL HIGH (ref 50–100)
Protein, Urine: 142 mg/dL
Urine Total Volume-UPROT: 1350 mL

## 2021-06-05 NOTE — Progress Notes (Signed)
The proposed treatment discussed in conference is for discussion purpose only and is not a binding recommendation.  The patients have not been physically examined, or presented with their treatment options.  Therefore, final treatment plans cannot be decided.  

## 2021-06-05 NOTE — Progress Notes (Signed)
[facility] OFFICE PROGRESS NOTE   Diagnosis: Colon cancer  INTERVAL HISTORY:   Louis [name] completed another cycle of FOLFIRI on 05/22/2021.  He had malaise on days 4 and 5.  Last weekend he had an episode of nausea and vomiting on Saturday and Sunday morning.  No further nausea.  He reports mild diarrhea following chemotherapy.  He feels better this week.  He has returned to work.  He cannot feel the axillary or inguinal lymph nodes.  Objective:  Vital signs in last 24 hours:  Blood pressure 124/67, pulse 81, temperature 97.7 F (36.5 C), temperature source Oral, resp. rate 18, height '6\' 4"'  (1.93 m), weight 240 lb 3.2 oz (109 kg), SpO2 99 %.    HEENT: No thrush or ulcers Lymphatics: 1.5 cm mobile left axillary node, 3-4 cm lateral left inguinal node Resp: Lungs clear bilaterally Cardio: Regular rate and rhythm GI: No hepatosplenomegaly Vascular: No leg edema   Portacath/PICC-without erythema  Lab Results:  Lab Results  Component Value Date   WBC 3.3 (L) 06/05/2021   HGB 9.3 (L) 06/05/2021   HCT 29.9 (L) 06/05/2021   MCV 84.7 06/05/2021   PLT 87 (L) 06/05/2021   NEUTROABS 1.9 06/05/2021    CMP  Lab Results  Component Value Date   NA 140 06/05/2021   K 4.3 06/05/2021   CL 110 06/05/2021   CO2 21 (L) 06/05/2021   GLUCOSE 168 (H) 06/05/2021   BUN 41 (H) 06/05/2021   CREATININE 2.93 (H) 06/05/2021   CALCIUM 8.7 (L) 06/05/2021   PROT 6.4 (L) 06/05/2021   ALBUMIN 3.5 06/05/2021   AST 12 (L) 06/05/2021   ALT 9 06/05/2021   ALKPHOS 73 06/05/2021   BILITOT 0.3 06/05/2021   GFRNONAA 22 (L) 06/05/2021   GFRAA 28 (L) 09/20/2019    Lab Results  Component Value Date   CEA1 2.61 01/02/2021   CEA 3.76 01/02/2021     Medications: I have reviewed the patient's current medications.   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), isolated mesenteric implant-resected, multiple tumor deposits Sigmoid/descending resection and creation  of a descending colostomy 04/10/2016 MSI-stable, no loss of mismatch repair protein expression Foundation 1- BRAF V600E positive, MS-stable, intermediate tumor mutation burden, no RAS mutation CT abdomen/pelvis 04/02/2016-no evidence of distant metastatic disease Cycle 1 FOLFOX 05/21/2016 Cycle 2 FOLFOX 06/04/2016 Cycle 3 FOLFOX 06/18/2016 Cycle 4 FOLFOX 07/02/2016 (oxaliplatin held secondary to thrombocytopenia) Cycle 5 FOLFOX 07/16/2016 Cycle 6 FOLFOX 07/30/2016 Cycle 7 FOLFOX 08/13/2016 (oxaliplatin held and 5-FU dose reduced) Cycle 8 FOLFOX 09/03/2016 (oxaliplatin held secondary to neuropathy) Cycle 9 FOLFOX 09/17/2016 (oxaliplatin held secondary to neuropathy) Cycle 10 FOLFOX 10/02/2016 Cycle 11 FOLFOX 10/15/2016 (oxaliplatin eliminated from the regimen) Cycle 12 FOLFOX 10/30/2016 (oxaliplatin eliminated) CTs 05/12/2017-no evidence of recurrent disease, right inguinal hernia containing bladder Colonoscopy 07/21/2017, 3 polyps were removed from the descending and transverse colon, fragments of tubular and tubulovillous adenoma CTs 05/19/2018-no evidence of recurrent disease, status post hernia repair, right upper lobe pneumonia CTs 05/20/2019-resolution of right upper lobe pneumonia, no evidence of metastatic disease Colonoscopy 01/25/2020-multiple polyps removed-tubular adenomas, poor preparation, repeat colonoscopy recommended CTs neck, chest, and abdomen/pelvis 08/27/2020- new 2 centimeter left axillary node, new 1.9 cm left inguinal node chronic mildly prominent portacaval node Ultrasound biopsy of left inguinal node on 10/29/2020-metastatic adenocarcinoma consistent with colon adenocarcinoma PET [id number] hypermetabolic left inguinal and left axillary nodes, solitary focus of hypermetabolic activity in the right lower quadrant small bowel Cycle 1 FOLFOX 12/19/2020 Cycle 2 FOLFOX  01/02/2021, oxaliplatin dose reduced secondary to neutropenia and thrombocytopenia Cycle 3 FOLFOX  01/16/2021 Cycle 4 FOLFOX 01/30/2021 CTs 02/11/2021-no change in left inguinal and left axillary nodes, no evidence of disease progression, stable wall thickening involving a loop of distal ileum Cycle 1 FOLFIRI/Avastin 02/27/2021 Cycle 2 FOLFIRI/Avastin 03/13/2021 Cycle 3 FOLFIRI/Avastin 04/03/2021 Cycle 4 FOLFIRI 04/17/2021, Avastin held due to elevated urine protein 05/07/2021-24-hour urine protein elevated 1562 Cycle 5 FOLFIRI 05/08/2021, Avastin held due to elevated urine protein, irinotecan dose reduced due to in general not feeling well after treatment CTs 05/17/2021-left axillary and left inguinal lymph nodes are smaller, no evidence of disease progression Cycle 6 FOLFIRI 05/22/2021, Avastin held due to proteinuria   2.   Chronic renal insufficiency   3.   Hypertension   4.  Inflamed sebaceous cyst at the upper back-status post incision and drainage   5.  Thrombocytopenia secondary chemotherapy-oxaliplatin dose reduced beginning with cycle 3 FOLFOX, oxaliplatin held with cycle 4 and cycle 7 FOLFOX   6.  Oxaliplatin neuropathy-improved   7.  History of Mucositis secondary chemotherapy   8.  Symptoms of pneumonia January 2020-CT 05/19/2018 consistent with right upper lobe pneumonia, Levaquin prescribed 05/20/2018   9.  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  He would like to delay chemotherapy for 1 week.  He will be scheduled for the next cycle of chemotherapy on 06/12/2021.  Chemotherapy will be changed to a 3-week interval.  He will return for an office visit and chemotherapy 07/03/2021.  He returned a 24-hour urine protein yesterday.  We will follow-up on this result and decide on resuming bevacizumab.  I presented his case at the GI tumor conference this morning.  There is no apparent abnormality in the right lower quadrant.  The plan  is to obtain a PET scan at the time of the next restaging.  We will refer him for resection of the left axillary and left inguinal node if there is no other evidence of metastatic disease.   The creatinine is higher and the platelet count is low today.  We will repeat labs when he is here next week.  Betsy Coder, MD  06/05/2021  10:13 AM

## 2021-06-07 ENCOUNTER — Inpatient Hospital Stay: Payer: Medicare Other

## 2021-06-08 ENCOUNTER — Other Ambulatory Visit: Payer: Self-pay | Admitting: Oncology

## 2021-06-12 ENCOUNTER — Inpatient Hospital Stay: Payer: Medicare Other

## 2021-06-12 ENCOUNTER — Inpatient Hospital Stay (HOSPITAL_BASED_OUTPATIENT_CLINIC_OR_DEPARTMENT_OTHER): Payer: Medicare Other | Admitting: Oncology

## 2021-06-12 ENCOUNTER — Other Ambulatory Visit: Payer: Self-pay

## 2021-06-12 ENCOUNTER — Encounter: Payer: Self-pay | Admitting: *Deleted

## 2021-06-12 VITALS — BP 139/73 | HR 57 | Temp 97.7°F | Resp 18 | Ht 76.0 in | Wt 240.0 lb

## 2021-06-12 DIAGNOSIS — C189 Malignant neoplasm of colon, unspecified: Secondary | ICD-10-CM

## 2021-06-12 DIAGNOSIS — C187 Malignant neoplasm of sigmoid colon: Secondary | ICD-10-CM | POA: Diagnosis not present

## 2021-06-12 DIAGNOSIS — D6959 Other secondary thrombocytopenia: Secondary | ICD-10-CM | POA: Diagnosis not present

## 2021-06-12 DIAGNOSIS — Z5111 Encounter for antineoplastic chemotherapy: Secondary | ICD-10-CM | POA: Diagnosis not present

## 2021-06-12 DIAGNOSIS — N189 Chronic kidney disease, unspecified: Secondary | ICD-10-CM | POA: Diagnosis not present

## 2021-06-12 DIAGNOSIS — G620 Drug-induced polyneuropathy: Secondary | ICD-10-CM | POA: Diagnosis not present

## 2021-06-12 DIAGNOSIS — C774 Secondary and unspecified malignant neoplasm of inguinal and lower limb lymph nodes: Secondary | ICD-10-CM | POA: Diagnosis not present

## 2021-06-12 LAB — CBC WITH DIFFERENTIAL (CANCER CENTER ONLY)
Abs Immature Granulocytes: 0.08 10*3/uL — ABNORMAL HIGH (ref 0.00–0.07)
Basophils Absolute: 0 10*3/uL (ref 0.0–0.1)
Basophils Relative: 1 %
Eosinophils Absolute: 0.3 10*3/uL (ref 0.0–0.5)
Eosinophils Relative: 7 %
HCT: 30.6 % — ABNORMAL LOW (ref 39.0–52.0)
Hemoglobin: 9.5 g/dL — ABNORMAL LOW (ref 13.0–17.0)
Immature Granulocytes: 2 %
Lymphocytes Relative: 22 %
Lymphs Abs: 1 10*3/uL (ref 0.7–4.0)
MCH: 26.5 pg (ref 26.0–34.0)
MCHC: 31 g/dL (ref 30.0–36.0)
MCV: 85.2 fL (ref 80.0–100.0)
Monocytes Absolute: 0.6 10*3/uL (ref 0.1–1.0)
Monocytes Relative: 14 %
Neutro Abs: 2.4 10*3/uL (ref 1.7–7.7)
Neutrophils Relative %: 54 %
Platelet Count: 186 10*3/uL (ref 150–400)
RBC: 3.59 MIL/uL — ABNORMAL LOW (ref 4.22–5.81)
RDW: 20.8 % — ABNORMAL HIGH (ref 11.5–15.5)
WBC Count: 4.4 10*3/uL (ref 4.0–10.5)
nRBC: 0 % (ref 0.0–0.2)

## 2021-06-12 LAB — CMP (CANCER CENTER ONLY)
ALT: 12 U/L (ref 0–44)
AST: 10 U/L — ABNORMAL LOW (ref 15–41)
Albumin: 3.8 g/dL (ref 3.5–5.0)
Alkaline Phosphatase: 93 U/L (ref 38–126)
Anion gap: 9 (ref 5–15)
BUN: 30 mg/dL — ABNORMAL HIGH (ref 8–23)
CO2: 21 mmol/L — ABNORMAL LOW (ref 22–32)
Calcium: 8.7 mg/dL — ABNORMAL LOW (ref 8.9–10.3)
Chloride: 108 mmol/L (ref 98–111)
Creatinine: 2.51 mg/dL — ABNORMAL HIGH (ref 0.61–1.24)
GFR, Estimated: 26 mL/min — ABNORMAL LOW (ref >60)
Glucose, Bld: 145 mg/dL — ABNORMAL HIGH (ref 70–99)
Potassium: 4.2 mmol/L (ref 3.5–5.1)
Sodium: 138 mmol/L (ref 135–145)
Total Bilirubin: 0.3 mg/dL (ref 0.3–1.2)
Total Protein: 6.7 g/dL (ref 6.5–8.1)

## 2021-06-12 LAB — TOTAL PROTEIN, URINE DIPSTICK: Protein, ur: 30 mg/dL — AB

## 2021-06-12 MED ORDER — SODIUM CHLORIDE 0.9 % IV SOLN
120.0000 mg/m2 | Freq: Once | INTRAVENOUS | Status: AC
  Administered 2021-06-12: 300 mg via INTRAVENOUS
  Filled 2021-06-12: qty 15

## 2021-06-12 MED ORDER — FLUOROURACIL CHEMO INJECTION 2.5 GM/50ML
300.0000 mg/m2 | Freq: Once | INTRAVENOUS | Status: AC
  Administered 2021-06-12: 750 mg via INTRAVENOUS
  Filled 2021-06-12: qty 15

## 2021-06-12 MED ORDER — SODIUM CHLORIDE 0.9 % IV SOLN
1800.0000 mg/m2 | INTRAVENOUS | Status: DC
  Administered 2021-06-12: 4350 mg via INTRAVENOUS
  Filled 2021-06-12: qty 87

## 2021-06-12 MED ORDER — SODIUM CHLORIDE 0.9 % IV SOLN
10.0000 mg | Freq: Once | INTRAVENOUS | Status: AC
  Administered 2021-06-12: 10 mg via INTRAVENOUS
  Filled 2021-06-12: qty 1

## 2021-06-12 MED ORDER — SODIUM CHLORIDE 0.9 % IV SOLN
300.0000 mg/m2 | Freq: Once | INTRAVENOUS | Status: AC
  Administered 2021-06-12: 726 mg via INTRAVENOUS
  Filled 2021-06-12: qty 36.3

## 2021-06-12 MED ORDER — SODIUM CHLORIDE 0.9 % IV SOLN
150.0000 mg | Freq: Once | INTRAVENOUS | Status: AC
  Administered 2021-06-12: 150 mg via INTRAVENOUS
  Filled 2021-06-12: qty 5

## 2021-06-12 MED ORDER — PALONOSETRON HCL INJECTION 0.25 MG/5ML
0.2500 mg | Freq: Once | INTRAVENOUS | Status: AC
  Administered 2021-06-12: 0.25 mg via INTRAVENOUS
  Filled 2021-06-12: qty 5

## 2021-06-12 MED ORDER — ATROPINE SULFATE 1 MG/ML IV SOLN
0.5000 mg | Freq: Once | INTRAVENOUS | Status: AC | PRN
  Administered 2021-06-12: 0.5 mg via INTRAVENOUS
  Filled 2021-06-12: qty 1

## 2021-06-12 MED ORDER — SODIUM CHLORIDE 0.9 % IV SOLN: Freq: Once | INTRAVENOUS | Status: AC

## 2021-06-12 NOTE — Progress Notes (Signed)
Per Dr. Benay Spice: OK to treat w/FOLFIFI today with creatinine 2.51. Does not need urine protein today. May not test next tx either. Will decide at next visit.

## 2021-06-12 NOTE — Patient Instructions (Addendum)
[facility]   Discharge Instructions: Thank you for choosing [facility] to provide your oncology and hematology care.   If you have a lab appointment with the [facility], please go directly to the [facility] and check in at the registration area.   Wear comfortable clothing and clothing appropriate for easy access to any Portacath or PICC line.   We strive to give you quality time with your provider. You may need to reschedule your appointment if you arrive late (15 or more minutes).  Arriving late affects you and other patients whose appointments are after yours.  Also, if you miss three or more appointments without notifying the office, you may be dismissed from the clinic at the providers discretion.      For prescription refill requests, have your pharmacy contact our office and allow 72 hours for refills to be completed.    Today you received the following chemotherapy and/or immunotherapy agents Irinotecan (CAMPTOSAR), Leucovorin & Flourouracil (ADRUCIL).      To help prevent nausea and vomiting after your treatment, we encourage you to take your nausea medication as directed.  BELOW ARE SYMPTOMS THAT SHOULD BE REPORTED IMMEDIATELY: *FEVER GREATER THAN 100.4 F (38 C) OR HIGHER *CHILLS OR SWEATING *NAUSEA AND VOMITING THAT IS NOT CONTROLLED WITH YOUR NAUSEA MEDICATION *UNUSUAL SHORTNESS OF BREATH *UNUSUAL BRUISING OR BLEEDING *URINARY PROBLEMS (pain or burning when urinating, or frequent urination) *BOWEL PROBLEMS (unusual diarrhea, constipation, pain near the anus) TENDERNESS IN MOUTH AND THROAT WITH OR WITHOUT PRESENCE OF ULCERS (sore throat, sores in mouth, or a toothache) UNUSUAL RASH, SWELLING OR PAIN  UNUSUAL VAGINAL DISCHARGE OR ITCHING   Items with * indicate a potential emergency and should be followed up as soon as possible or go to the Emergency Department if any problems should occur.  Please show the CHEMOTHERAPY ALERT  CARD or IMMUNOTHERAPY ALERT CARD at check-in to the Emergency Department and triage nurse.  Should you have questions after your visit or need to cancel or reschedule your appointment, please contact [facility]  Dept: [phone]  and follow the prompts.  Office hours are 8:00 a.m. to 4:30 p.m. Monday - Friday. Please note that voicemails left after 4:00 p.m. may not be returned until the following business day.  We are closed weekends and major holidays. You have access to a nurse at all times for urgent questions. Please call the main number to the clinic Dept: [phone] and follow the prompts.   For any non-urgent questions, you may also contact your provider using MyChart. We now offer e-Visits for anyone 66 and older to request care online for non-urgent symptoms. For details visit [email or web address].   Also download the MyChart app! Go to the app store, search "MyChart", open the app, select [facility], and log in with your MyChart username and password.  Due to Covid, a mask is required upon entering the hospital/clinic. If you do not have a mask, one will be given to you upon arrival. For doctor visits, patients may have 1 support person aged 29 or older with them. For treatment visits, patients cannot have anyone with them due to current Covid guidelines and our immunocompromised population.   Irinotecan injection What is this medication? IRINOTECAN (ir in oh TEE kan ) is a chemotherapy drug. It is used to treat colon and rectal cancer. This medicine may be used for other purposes; ask your health care provider or pharmacist if you  have questions. COMMON BRAND NAME(S): Camptosar What should I tell my care team before I take this medication? They need to know if you have any of these conditions: dehydration diarrhea infection (especially a virus infection such as chickenpox, cold sores, or herpes) liver disease low blood counts, like low white cell,  platelet, or red cell counts low levels of calcium, magnesium, or potassium in the blood recent or ongoing radiation therapy an unusual or allergic reaction to irinotecan, other medicines, foods, dyes, or preservatives pregnant or trying to get pregnant breast-feeding How should I use this medication? This drug is given as an infusion into a vein. It is administered in a hospital or clinic by a specially trained health care professional. Talk to your pediatrician regarding the use of this medicine in children. Special care may be needed. Overdosage: If you think you have taken too much of this medicine contact a poison control center or emergency room at once. NOTE: This medicine is only for you. Do not share this medicine with others. What if I miss a dose? It is important not to miss your dose. Call your doctor or health care professional if you are unable to keep an appointment. What may interact with this medication? Do not take this medicine with any of the following medications: cobicistat itraconazole This medicine may interact with the following medications: antiviral medicines for HIV or AIDS certain antibiotics like rifampin or rifabutin certain medicines for fungal infections like ketoconazole, posaconazole, and voriconazole certain medicines for seizures like carbamazepine, phenobarbital, phenotoin clarithromycin gemfibrozil nefazodone [facility]. John's Wort This list may not describe all possible interactions. Give your health care provider a list of all the medicines, herbs, non-prescription drugs, or dietary supplements you use. Also tell them if you smoke, drink alcohol, or use illegal drugs. Some items may interact with your medicine. What should I watch for while using this medication? Your condition will be monitored carefully while you are receiving this medicine. You will need important blood work done while you are taking this medicine. This drug may make you feel  generally unwell. This is not uncommon, as chemotherapy can affect healthy cells as well as cancer cells. Report any side effects. Continue your course of treatment even though you feel ill unless your doctor tells you to stop. In some cases, you may be given additional medicines to help with side effects. Follow all directions for their use. You may get drowsy or dizzy. Do not drive, use machinery, or do anything that needs mental alertness until you know how this medicine affects you. Do not stand or sit up quickly, especially if you are an older patient. This reduces the risk of dizzy or fainting spells. Call your health care professional for advice if you get a fever, chills, or sore throat, or other symptoms of a cold or flu. Do not treat yourself. This medicine decreases your body's ability to fight infections. Try to avoid being around people who are sick. Avoid taking products that contain aspirin, acetaminophen, ibuprofen, naproxen, or ketoprofen unless instructed by your doctor. These medicines may hide a fever. This medicine may increase your risk to bruise or bleed. Call your doctor or health care professional if you notice any unusual bleeding. Be careful brushing and flossing your teeth or using a toothpick because you may get an infection or bleed more easily. If you have any dental work done, tell your dentist you are receiving this medicine. Do not become pregnant while taking this medicine or for  6 months after stopping it. Women should inform their health care professional if they wish to become pregnant or think they might be pregnant. Men should not father a child while taking this medicine and for 3 months after stopping it. There is potential for serious side effects to an unborn child. Talk to your health care professional for more information. Do not breast-feed an infant while taking this medicine or for 7 days after stopping it. This medicine has caused ovarian failure in some  women. This medicine may make it more difficult to get pregnant. Talk to your health care professional if you are concerned about your fertility. This medicine has caused decreased sperm counts in some men. This may make it more difficult to father a child. Talk to your health care professional if you are concerned about your fertility. What side effects may I notice from receiving this medication? Side effects that you should report to your doctor or health care professional as soon as possible: allergic reactions like skin rash, itching or hives, swelling of the face, lips, or tongue chest pain diarrhea flushing, runny nose, sweating during infusion low blood counts - this medicine may decrease the number of white blood cells, red blood cells and platelets. You may be at increased risk for infections and bleeding. nausea, vomiting pain, swelling, warmth in the leg signs of decreased platelets or bleeding - bruising, pinpoint red spots on the skin, black, tarry stools, blood in the urine signs of infection - fever or chills, cough, sore throat, pain or difficulty passing urine signs of decreased red blood cells - unusually weak or tired, fainting spells, lightheadedness Side effects that usually do not require medical attention (report to your doctor or health care professional if they continue or are bothersome): constipation hair loss headache loss of appetite mouth sores stomach pain This list may not describe all possible side effects. Call your doctor for medical advice about side effects. You may report side effects to FDA at 1-800-FDA-1088. Where should I keep my medication? This drug is given in a hospital or clinic and will not be stored at home. NOTE: This sheet is a summary. It may not cover all possible information. If you have questions about this medicine, talk to your doctor, pharmacist, or health care provider.  2022 Elsevier/Gold Standard (2021-01-08  00:00:00)  Leucovorin injection What is this medication? LEUCOVORIN (loo koe VOR in) is used to prevent or treat the harmful effects of some medicines. This medicine is used to treat anemia caused by a low amount of folic acid in the body. It is also used with 5-fluorouracil (5-FU) to treat colon cancer. This medicine may be used for other purposes; ask your health care provider or pharmacist if you have questions. What should I tell my care team before I take this medication? They need to know if you have any of these conditions: anemia from low levels of vitamin B-12 in the blood an unusual or allergic reaction to leucovorin, folic acid, other medicines, foods, dyes, or preservatives pregnant or trying to get pregnant breast-feeding How should I use this medication? This medicine is for injection into a muscle or into a vein. It is given by a health care professional in a hospital or clinic setting. Talk to your pediatrician regarding the use of this medicine in children. Special care may be needed. Overdosage: If you think you have taken too much of this medicine contact a poison control center or emergency room at once. NOTE: This  medicine is only for you. Do not share this medicine with others. What if I miss a dose? This does not apply. What may interact with this medication? capecitabine fluorouracil phenobarbital phenytoin primidone trimethoprim-sulfamethoxazole This list may not describe all possible interactions. Give your health care provider a list of all the medicines, herbs, non-prescription drugs, or dietary supplements you use. Also tell them if you smoke, drink alcohol, or use illegal drugs. Some items may interact with your medicine. What should I watch for while using this medication? Your condition will be monitored carefully while you are receiving this medicine. This medicine may increase the side effects of 5-fluorouracil, 5-FU. Tell your doctor or health care  professional if you have diarrhea or mouth sores that do not get better or that get worse. What side effects may I notice from receiving this medication? Side effects that you should report to your doctor or health care professional as soon as possible: allergic reactions like skin rash, itching or hives, swelling of the face, lips, or tongue breathing problems fever, infection mouth sores unusual bleeding or bruising unusually weak or tired Side effects that usually do not require medical attention (report to your doctor or health care professional if they continue or are bothersome): constipation or diarrhea loss of appetite nausea, vomiting This list may not describe all possible side effects. Call your doctor for medical advice about side effects. You may report side effects to FDA at 1-800-FDA-1088. Where should I keep my medication? This drug is given in a hospital or clinic and will not be stored at home. NOTE: This sheet is a summary. It may not cover all possible information. If you have questions about this medicine, talk to your doctor, pharmacist, or health care provider.  2022 Elsevier/Gold Standard (2007-10-28 00:00:00)  Fluorouracil, 5-FU injection What is this medication? FLUOROURACIL, 5-FU (flure oh YOOR a sil) is a chemotherapy drug. It slows the growth of cancer cells. This medicine is used to treat many types of cancer like breast cancer, colon or rectal cancer, pancreatic cancer, and stomach cancer. This medicine may be used for other purposes; ask your health care provider or pharmacist if you have questions. COMMON BRAND NAME(S): Adrucil What should I tell my care team before I take this medication? They need to know if you have any of these conditions: blood disorders dihydropyrimidine dehydrogenase (DPD) deficiency infection (especially a virus infection such as chickenpox, cold sores, or herpes) kidney disease liver disease malnourished, poor  nutrition recent or ongoing radiation therapy an unusual or allergic reaction to fluorouracil, other chemotherapy, other medicines, foods, dyes, or preservatives pregnant or trying to get pregnant breast-feeding How should I use this medication? This drug is given as an infusion or injection into a vein. It is administered in a hospital or clinic by a specially trained health care professional. Talk to your pediatrician regarding the use of this medicine in children. Special care may be needed. Overdosage: If you think you have taken too much of this medicine contact a poison control center or emergency room at once. NOTE: This medicine is only for you. Do not share this medicine with others. What if I miss a dose? It is important not to miss your dose. Call your doctor or health care professional if you are unable to keep an appointment. What may interact with this medication? Do not take this medicine with any of the following medications: live virus vaccines This medicine may also interact with the following medications: medicines that treat  or prevent blood clots like warfarin, enoxaparin, and dalteparin This list may not describe all possible interactions. Give your health care provider a list of all the medicines, herbs, non-prescription drugs, or dietary supplements you use. Also tell them if you smoke, drink alcohol, or use illegal drugs. Some items may interact with your medicine. What should I watch for while using this medication? Visit your doctor for checks on your progress. This drug may make you feel generally unwell. This is not uncommon, as chemotherapy can affect healthy cells as well as cancer cells. Report any side effects. Continue your course of treatment even though you feel ill unless your doctor tells you to stop. In some cases, you may be given additional medicines to help with side effects. Follow all directions for their use. Call your doctor or health care  professional for advice if you get a fever, chills or sore throat, or other symptoms of a cold or flu. Do not treat yourself. This drug decreases your body's ability to fight infections. Try to avoid being around people who are sick. This medicine may increase your risk to bruise or bleed. Call your doctor or health care professional if you notice any unusual bleeding. Be careful brushing and flossing your teeth or using a toothpick because you may get an infection or bleed more easily. If you have any dental work done, tell your dentist you are receiving this medicine. Avoid taking products that contain aspirin, acetaminophen, ibuprofen, naproxen, or ketoprofen unless instructed by your doctor. These medicines may hide a fever. Do not become pregnant while taking this medicine. Women should inform their doctor if they wish to become pregnant or think they might be pregnant. There is a potential for serious side effects to an unborn child. Talk to your health care professional or pharmacist for more information. Do not breast-feed an infant while taking this medicine. Men should inform their doctor if they wish to father a child. This medicine may lower sperm counts. Do not treat diarrhea with over the counter products. Contact your doctor if you have diarrhea that lasts more than 2 days or if it is severe and watery. This medicine can make you more sensitive to the sun. Keep out of the sun. If you cannot avoid being in the sun, wear protective clothing and use sunscreen. Do not use sun lamps or tanning beds/booths. What side effects may I notice from receiving this medication? Side effects that you should report to your doctor or health care professional as soon as possible: allergic reactions like skin rash, itching or hives, swelling of the face, lips, or tongue low blood counts - this medicine may decrease the number of white blood cells, red blood cells and platelets. You may be at increased risk for  infections and bleeding. signs of infection - fever or chills, cough, sore throat, pain or difficulty passing urine signs of decreased platelets or bleeding - bruising, pinpoint red spots on the skin, black, tarry stools, blood in the urine signs of decreased red blood cells - unusually weak or tired, fainting spells, lightheadedness breathing problems changes in vision chest pain mouth sores nausea and vomiting pain, swelling, redness at site where injected pain, tingling, numbness in the hands or feet redness, swelling, or sores on hands or feet stomach pain unusual bleeding Side effects that usually do not require medical attention (report to your doctor or health care professional if they continue or are bothersome): changes in finger or toe nails diarrhea dry or  itchy skin hair loss headache loss of appetite sensitivity of eyes to the light stomach upset unusually teary eyes This list may not describe all possible side effects. Call your doctor for medical advice about side effects. You may report side effects to FDA at 1-800-FDA-1088. Where should I keep my medication? This drug is given in a hospital or clinic and will not be stored at home. NOTE: This sheet is a summary. It may not cover all possible information. If you have questions about this medicine, talk to your doctor, pharmacist, or health care provider.  2022 Elsevier/Gold Standard (2021-01-08 00:00:00)  The chemotherapy medication bag should finish at 46 hours, 96 hours, or 7 days. For example, if your pump is scheduled for 46 hours and it was put on at 4:00 p.m., it should finish at 2:00 p.m. the day it is scheduled to come off regardless of your appointment time.     Estimated time to finish at 10:30 a.m. on Friday 06/14/2021.   If the display on your pump reads "Low Volume" and it is beeping, take the batteries out of the pump and come to the cancer center for it to be taken off.   If the pump alarms go off  prior to the pump reading "Low Volume" then call [phone] and someone can assist you.  If the plunger comes out and the chemotherapy medication is leaking out, please use your home chemo spill kit to clean up the spill. Do NOT use paper towels or other household products.  If you have problems or questions regarding your pump, please call either [phone] (24 hours a day) or the cancer center Monday-Friday 8:00 a.m.- 4:30 p.m. at the clinic number and we will assist you. If you are unable to get assistance, then go to the nearest Emergency Department and ask the staff to contact the IV team for assistance.

## 2021-06-12 NOTE — Progress Notes (Signed)
Patient presents for treatment. RN assessment completed along with the following:  Labs/vitals reviewed - Yes, and Per Dr. Benay Spice, ok to treat with Scr 2.51    Weight within 10% of previous measurement - Yes Informed consent completed and reflects current therapy/intent - Yes, on date 02/27/21             Provider progress note reviewed - Patient not seen by provider today. Most recent note dated 06/05/21 reviewed. Treatment/Antibody/Supportive plan reviewed - Yes, and per 06/05/21 visit/lab work, bevacizumab to be held this treatment.  S&H and other orders reviewed - Yes, and there are no additional orders identified. Previous treatment date reviewed - Yes, and the appropriate amount of time has elapsed between treatments.  Patient to proceed with treatment.

## 2021-06-12 NOTE — Patient Instructions (Signed)

## 2021-06-14 ENCOUNTER — Inpatient Hospital Stay: Payer: Medicare Other

## 2021-06-14 ENCOUNTER — Other Ambulatory Visit: Payer: Self-pay

## 2021-06-14 VITALS — BP 120/56 | HR 66 | Temp 98.5°F | Resp 19

## 2021-06-14 DIAGNOSIS — D6959 Other secondary thrombocytopenia: Secondary | ICD-10-CM | POA: Diagnosis not present

## 2021-06-14 DIAGNOSIS — Z5111 Encounter for antineoplastic chemotherapy: Secondary | ICD-10-CM | POA: Diagnosis not present

## 2021-06-14 DIAGNOSIS — G620 Drug-induced polyneuropathy: Secondary | ICD-10-CM | POA: Diagnosis not present

## 2021-06-14 DIAGNOSIS — N189 Chronic kidney disease, unspecified: Secondary | ICD-10-CM | POA: Diagnosis not present

## 2021-06-14 DIAGNOSIS — Z95828 Presence of other vascular implants and grafts: Secondary | ICD-10-CM

## 2021-06-14 DIAGNOSIS — C187 Malignant neoplasm of sigmoid colon: Secondary | ICD-10-CM | POA: Diagnosis not present

## 2021-06-14 DIAGNOSIS — C774 Secondary and unspecified malignant neoplasm of inguinal and lower limb lymph nodes: Secondary | ICD-10-CM | POA: Diagnosis not present

## 2021-06-14 MED ORDER — HEPARIN SOD (PORK) LOCK FLUSH 100 UNIT/ML IV SOLN
500.0000 [IU] | Freq: Once | INTRAVENOUS | Status: AC | PRN
  Administered 2021-06-14: 500 [IU] via INTRAVENOUS

## 2021-06-14 MED ORDER — SODIUM CHLORIDE 0.9% FLUSH
10.0000 mL | INTRAVENOUS | Status: DC | PRN
  Administered 2021-06-14: 10 mL via INTRAVENOUS

## 2021-06-17 ENCOUNTER — Telehealth: Payer: Self-pay

## 2021-06-17 NOTE — Telephone Encounter (Signed)
TC from Pt stating he has a cold and has been wheezing and wanted a antibiotic called in. Asked Pt if he had fever, sore throat, or body aches. Pt stated he did have a fever yesterday but not today. Pt also stated he took a Covid test and it was negative. Informed Pt that Dr Benay Spice wants him to call his Primary Care Physician. Pt verbalized understanding.

## 2021-06-29 ENCOUNTER — Other Ambulatory Visit: Payer: Self-pay | Admitting: Oncology

## 2021-06-30 ENCOUNTER — Encounter: Payer: Self-pay | Admitting: Oncology

## 2021-06-30 NOTE — Progress Notes (Signed)
Patient not seen.

## 2021-07-03 ENCOUNTER — Other Ambulatory Visit: Payer: Self-pay | Admitting: Nurse Practitioner

## 2021-07-03 ENCOUNTER — Inpatient Hospital Stay (HOSPITAL_BASED_OUTPATIENT_CLINIC_OR_DEPARTMENT_OTHER): Payer: Medicare Other | Admitting: Nurse Practitioner

## 2021-07-03 ENCOUNTER — Inpatient Hospital Stay: Payer: Medicare Other

## 2021-07-03 ENCOUNTER — Other Ambulatory Visit: Payer: Self-pay

## 2021-07-03 ENCOUNTER — Encounter: Payer: Self-pay | Admitting: Nurse Practitioner

## 2021-07-03 ENCOUNTER — Encounter: Payer: Self-pay | Admitting: *Deleted

## 2021-07-03 ENCOUNTER — Inpatient Hospital Stay: Payer: Medicare Other | Attending: Oncology

## 2021-07-03 VITALS — BP 121/63 | HR 65 | Temp 98.4°F | Resp 18 | Wt 241.6 lb

## 2021-07-03 DIAGNOSIS — Z452 Encounter for adjustment and management of vascular access device: Secondary | ICD-10-CM | POA: Insufficient documentation

## 2021-07-03 DIAGNOSIS — C774 Secondary and unspecified malignant neoplasm of inguinal and lower limb lymph nodes: Secondary | ICD-10-CM | POA: Diagnosis not present

## 2021-07-03 DIAGNOSIS — C189 Malignant neoplasm of colon, unspecified: Secondary | ICD-10-CM

## 2021-07-03 DIAGNOSIS — K1231 Oral mucositis (ulcerative) due to antineoplastic therapy: Secondary | ICD-10-CM | POA: Insufficient documentation

## 2021-07-03 DIAGNOSIS — C187 Malignant neoplasm of sigmoid colon: Secondary | ICD-10-CM | POA: Insufficient documentation

## 2021-07-03 DIAGNOSIS — N189 Chronic kidney disease, unspecified: Secondary | ICD-10-CM | POA: Insufficient documentation

## 2021-07-03 DIAGNOSIS — I10 Essential (primary) hypertension: Secondary | ICD-10-CM | POA: Diagnosis not present

## 2021-07-03 DIAGNOSIS — Z5111 Encounter for antineoplastic chemotherapy: Secondary | ICD-10-CM | POA: Diagnosis not present

## 2021-07-03 DIAGNOSIS — G620 Drug-induced polyneuropathy: Secondary | ICD-10-CM | POA: Diagnosis not present

## 2021-07-03 DIAGNOSIS — D709 Neutropenia, unspecified: Secondary | ICD-10-CM | POA: Insufficient documentation

## 2021-07-03 LAB — CBC WITH DIFFERENTIAL (CANCER CENTER ONLY)
Abs Immature Granulocytes: 0.02 10*3/uL (ref 0.00–0.07)
Basophils Absolute: 0 10*3/uL (ref 0.0–0.1)
Basophils Relative: 1 %
Eosinophils Absolute: 0.3 10*3/uL (ref 0.0–0.5)
Eosinophils Relative: 8 %
HCT: 31.6 % — ABNORMAL LOW (ref 39.0–52.0)
Hemoglobin: 9.8 g/dL — ABNORMAL LOW (ref 13.0–17.0)
Immature Granulocytes: 1 %
Lymphocytes Relative: 34 %
Lymphs Abs: 1.3 10*3/uL (ref 0.7–4.0)
MCH: 27.1 pg (ref 26.0–34.0)
MCHC: 31 g/dL (ref 30.0–36.0)
MCV: 87.5 fL (ref 80.0–100.0)
Monocytes Absolute: 0.5 10*3/uL (ref 0.1–1.0)
Monocytes Relative: 13 %
Neutro Abs: 1.6 10*3/uL — ABNORMAL LOW (ref 1.7–7.7)
Neutrophils Relative %: 43 %
Platelet Count: 174 10*3/uL (ref 150–400)
RBC: 3.61 MIL/uL — ABNORMAL LOW (ref 4.22–5.81)
RDW: 20.9 % — ABNORMAL HIGH (ref 11.5–15.5)
WBC Count: 3.7 10*3/uL — ABNORMAL LOW (ref 4.0–10.5)
nRBC: 0 % (ref 0.0–0.2)

## 2021-07-03 LAB — CMP (CANCER CENTER ONLY)
ALT: 8 U/L (ref 0–44)
AST: 9 U/L — ABNORMAL LOW (ref 15–41)
Albumin: 4 g/dL (ref 3.5–5.0)
Alkaline Phosphatase: 102 U/L (ref 38–126)
Anion gap: 8 (ref 5–15)
BUN: 32 mg/dL — ABNORMAL HIGH (ref 8–23)
CO2: 22 mmol/L (ref 22–32)
Calcium: 8.9 mg/dL (ref 8.9–10.3)
Chloride: 108 mmol/L (ref 98–111)
Creatinine: 2.41 mg/dL — ABNORMAL HIGH (ref 0.61–1.24)
GFR, Estimated: 27 mL/min — ABNORMAL LOW (ref >60)
Glucose, Bld: 160 mg/dL — ABNORMAL HIGH (ref 70–99)
Potassium: 4.4 mmol/L (ref 3.5–5.1)
Sodium: 138 mmol/L (ref 135–145)
Total Bilirubin: 0.3 mg/dL (ref 0.3–1.2)
Total Protein: 6.7 g/dL (ref 6.5–8.1)

## 2021-07-03 MED ORDER — FLUOROURACIL CHEMO INJECTION 2.5 GM/50ML
300.0000 mg/m2 | Freq: Once | INTRAVENOUS | Status: AC
  Administered 2021-07-03: 750 mg via INTRAVENOUS
  Filled 2021-07-03: qty 15

## 2021-07-03 MED ORDER — SODIUM CHLORIDE 0.9 % IV SOLN
120.0000 mg/m2 | Freq: Once | INTRAVENOUS | Status: AC
  Administered 2021-07-03: 300 mg via INTRAVENOUS
  Filled 2021-07-03: qty 15

## 2021-07-03 MED ORDER — SODIUM CHLORIDE 0.9 % IV SOLN
10.0000 mg | Freq: Once | INTRAVENOUS | Status: AC
  Administered 2021-07-03: 10 mg via INTRAVENOUS
  Filled 2021-07-03: qty 10

## 2021-07-03 MED ORDER — ATROPINE SULFATE 1 MG/ML IV SOLN
0.5000 mg | Freq: Once | INTRAVENOUS | Status: AC | PRN
  Administered 2021-07-03: 0.5 mg via INTRAVENOUS
  Filled 2021-07-03: qty 1

## 2021-07-03 MED ORDER — SODIUM CHLORIDE 0.9 % IV SOLN
150.0000 mg | Freq: Once | INTRAVENOUS | Status: AC
  Administered 2021-07-03: 150 mg via INTRAVENOUS
  Filled 2021-07-03: qty 150

## 2021-07-03 MED ORDER — SODIUM CHLORIDE 0.9 % IV SOLN
300.0000 mg/m2 | Freq: Once | INTRAVENOUS | Status: AC
  Administered 2021-07-03: 726 mg via INTRAVENOUS
  Filled 2021-07-03: qty 36.3

## 2021-07-03 MED ORDER — PALONOSETRON HCL INJECTION 0.25 MG/5ML
0.2500 mg | Freq: Once | INTRAVENOUS | Status: AC
  Administered 2021-07-03: 0.25 mg via INTRAVENOUS
  Filled 2021-07-03: qty 5

## 2021-07-03 MED ORDER — SODIUM CHLORIDE 0.9 % IV SOLN
1800.0000 mg/m2 | INTRAVENOUS | Status: DC
  Administered 2021-07-03: 4350 mg via INTRAVENOUS
  Filled 2021-07-03: qty 87

## 2021-07-03 MED ORDER — SODIUM CHLORIDE 0.9 % IV SOLN: Freq: Once | INTRAVENOUS | Status: AC

## 2021-07-03 NOTE — Progress Notes (Signed)
Patient presents for treatment. RN assessment completed along with the following: ? ?Labs/vitals reviewed - Yes, and please see collab nurse note, no Avastin today.    ?Weight within 10% of previous measurement - Yes ?Informed consent completed and reflects current therapy/intent - Yes, on date 02/27/2021             ?Provider progress note reviewed - Yes, today's provider note was reviewed. ?Treatment/Antibody/Supportive plan reviewed - Yes, and there are no adjustments needed for today's treatment. ?S&H and other orders reviewed - Yes, and there are no additional orders identified. ?Previous treatment date reviewed - Yes, and the appropriate amount of time has elapsed between treatments. ?Clinic Hand Off Received from - Yes from [place], RN ? ?Patient to proceed with treatment.  ? ?

## 2021-07-03 NOTE — Progress Notes (Addendum)
Patient seen by Ned Card NP today ? ?Vitals are within treatment parameters. ? ?Labs reviewed by Ned Card NP and are not all within treatment parameters. OK to treat w/ANC 1.6 today ?OK to treat with creatinine 2.41 today. ? ?Per physician team, patient is ready for treatment. Please note that modifications are being made to the treatment plan including Will not receive Avastin today. ?24 hour urine for protein will be collected on 3/10 w/CBC  ?

## 2021-07-03 NOTE — Progress Notes (Signed)
?[facility] ?OFFICE PROGRESS NOTE ? ? ?Diagnosis: Colon cancer ? ?INTERVAL HISTORY:  ? ?[name] returns as scheduled.  He completed another cycle of FOLFIRI 06/12/2021.  Around day 3 he tends to develop diarrhea with 2 loose stools a day.  He takes Imodium with good relief.  Nausea is described as "not bad".  No mouth sores.  Blood with nose blowing.  No other bleeding. ? ?Objective: ? ?Vital signs in last 24 hours: ? ?Blood pressure 121/63, pulse 65, temperature 98.4 ?F (36.9 ?C), temperature source Tympanic, resp. rate 18, weight 241 lb 9.6 oz (109.6 kg), SpO2 100 %. ?  ? ?HEENT: No thrush or ulcers. ?Lymphatics: Approximate 1.5 cm mobile left axillary lymph node.  3 to 4 cm left inguinal lymph node. ?Resp: Lungs clear bilaterally. ?Cardio: Regular rate and rhythm. ?GI: Abdomen soft and nontender.  No hepatomegaly. ?Vascular: No leg edema. ?Skin: No rash. ?Port-A-Cath without erythema. ? ?Lab Results: ? ?Lab Results  ?Component Value Date  ? WBC 3.7 (L) 07/03/2021  ? HGB 9.8 (L) 07/03/2021  ? HCT 31.6 (L) 07/03/2021  ? MCV 87.5 07/03/2021  ? PLT 174 07/03/2021  ? NEUTROABS 1.6 (L) 07/03/2021  ? ? ?Imaging: ? ?No results found. ? ?Medications: I have reviewed the patient's current medications. ? ?Assessment/Plan: ?Sigmoid colon cancer, stage IV (T4a,N1b,M1a), isolated mesenteric implant-resected, multiple tumor deposits ?Sigmoid/descending resection and creation of a descending colostomy 04/10/2016 ?MSI-stable, no loss of mismatch repair protein expression ?Foundation 1- BRAF V600E positive, MS-stable, intermediate tumor mutation burden, no RAS mutation ?CT abdomen/pelvis 04/02/2016-no evidence of distant metastatic disease ?Cycle 1 FOLFOX 05/21/2016 ?Cycle 2 FOLFOX 06/04/2016 ?Cycle 3 FOLFOX 06/18/2016 ?Cycle 4 FOLFOX 07/02/2016 (oxaliplatin held secondary to thrombocytopenia) ?Cycle 5 FOLFOX 07/16/2016 ?Cycle 6 FOLFOX 07/30/2016 ?Cycle 7 FOLFOX 08/13/2016 (oxaliplatin held and 5-FU dose  reduced) ?Cycle 8 FOLFOX 09/03/2016 (oxaliplatin held secondary to neuropathy) ?Cycle 9 FOLFOX 09/17/2016 (oxaliplatin held secondary to neuropathy) ?Cycle 10 FOLFOX 10/02/2016 ?Cycle 11 FOLFOX 10/15/2016 (oxaliplatin eliminated from the regimen) ?Cycle 12 FOLFOX 10/30/2016 (oxaliplatin eliminated) ?CTs 05/12/2017-no evidence of recurrent disease, right inguinal hernia containing bladder ?Colonoscopy 07/21/2017, 3 polyps were removed from the descending and transverse colon, fragments of tubular and tubulovillous adenoma ?CTs 05/19/2018-no evidence of recurrent disease, status post hernia repair, right upper lobe pneumonia ?CTs 05/20/2019-resolution of right upper lobe pneumonia, no evidence of metastatic disease ?Colonoscopy 01/25/2020-multiple polyps removed-tubular adenomas, poor preparation, repeat colonoscopy recommended ?CTs neck, chest, and abdomen/pelvis 08/27/2020- new 2 centimeter left axillary node, new 1.9 cm left inguinal node chronic mildly prominent portacaval node ?Ultrasound biopsy of left inguinal node on 10/29/2020-metastatic adenocarcinoma consistent with colon adenocarcinoma ?PET 11/12/2020-enlarged hypermetabolic left inguinal and left axillary nodes, solitary focus of hypermetabolic activity in the right lower quadrant small bowel ?Cycle 1 FOLFOX 12/19/2020 ?Cycle 2 FOLFOX 01/02/2021, oxaliplatin dose reduced secondary to neutropenia and thrombocytopenia ?Cycle 3 FOLFOX 01/16/2021 ?Cycle 4 FOLFOX 01/30/2021 ?CTs 02/11/2021-no change in left inguinal and left axillary nodes, no evidence of disease progression, stable wall thickening involving a loop of distal ileum ?Cycle 1 FOLFIRI/Avastin 02/27/2021 ?Cycle 2 FOLFIRI/Avastin 03/13/2021 ?Cycle 3 FOLFIRI/Avastin 04/03/2021 ?Cycle 4 FOLFIRI 04/17/2021, Avastin held due to elevated urine protein ?05/07/2021-24-hour urine protein elevated 1562 ?Cycle 5 FOLFIRI 05/08/2021, Avastin held due to elevated urine protein, irinotecan dose reduced due to in general not  feeling well after treatment ?CTs 05/17/2021-left axillary and left inguinal lymph nodes are smaller, no evidence of disease progression ?Cycle 6 FOLFIRI 05/22/2021, Avastin held due to proteinuria ?06/03/2021 24-hour urine   with 1.9 g of protein ?Cycle 7 FOLFIRI 06/12/2021, Avastin held due to proteinuria ?Cycle 8 FOLFIRI 07/03/2021, Avastin held due to proteinuria ?  ?2.   Chronic renal insufficiency ?  ?3.   Hypertension ?  ?4.  Inflamed sebaceous cyst at the upper back-status post incision and drainage ?  ?5.  Thrombocytopenia secondary chemotherapy-oxaliplatin dose reduced beginning with cycle 3 FOLFOX, oxaliplatin held with cycle 4 and cycle 7 FOLFOX ?  ?6.  Oxaliplatin neuropathy-improved ?  ?7.  History of Mucositis secondary chemotherapy ?  ?8.  Symptoms of pneumonia January 2020-CT 05/19/2018 consistent with right upper lobe pneumonia, Levaquin prescribed 05/20/2018 ?  ?9.  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  ?10.  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  ?  ? ?Disposition: [name] appears stable.  He has completed 8 cycles of FOLFIRI.  Avastin has been on hold due to proteinuria.  Plan to proceed with cycle 9 FOLFIRI today as scheduled, continue to hold Avastin.  He will submit 24-hour urine collection for total protein when he is here 07/12/2021. ? ?CBC from today reviewed.  Counts adequate to proceed with treatment.  He has mild neutropenia.  He will return for a nadir CBC on 07/12/2021. ? ?He will return for lab, follow-up, FOLFIRI in 3 weeks.  He will contact the office in the interim with any problems.  We specifically discussed fever, chills, other signs of infection. ? ?Plan reviewed with Dr. Sherrill. ? ? ? ?Lisa Thomas ANP/GNP-BC  ? ?07/03/2021  ?9:21 AM ? ? ? ? ? ? ? ?

## 2021-07-03 NOTE — Patient Instructions (Signed)
[facility]   Discharge Instructions: Thank you for choosing [facility] to provide your oncology and hematology care.   If you have a lab appointment with the [facility], please go directly to the [facility] and check in at the registration area.   Wear comfortable clothing and clothing appropriate for easy access to any Portacath or PICC line.   We strive to give you quality time with your provider. You may need to reschedule your appointment if you arrive late (15 or more minutes).  Arriving late affects you and other patients whose appointments are after yours.  Also, if you miss three or more appointments without notifying the office, you may be dismissed from the clinic at the providers discretion.      For prescription refill requests, have your pharmacy contact our office and allow 72 hours for refills to be completed.    Today you received the following chemotherapy and/or immunotherapy agents Irinotecan (CAMPTOSAR), Leucovorin & Flourouracil (ADRUCIL).      To help prevent nausea and vomiting after your treatment, we encourage you to take your nausea medication as directed.  BELOW ARE SYMPTOMS THAT SHOULD BE REPORTED IMMEDIATELY: *FEVER GREATER THAN 100.4 F (38 C) OR HIGHER *CHILLS OR SWEATING *NAUSEA AND VOMITING THAT IS NOT CONTROLLED WITH YOUR NAUSEA MEDICATION *UNUSUAL SHORTNESS OF BREATH *UNUSUAL BRUISING OR BLEEDING *URINARY PROBLEMS (pain or burning when urinating, or frequent urination) *BOWEL PROBLEMS (unusual diarrhea, constipation, pain near the anus) TENDERNESS IN MOUTH AND THROAT WITH OR WITHOUT PRESENCE OF ULCERS (sore throat, sores in mouth, or a toothache) UNUSUAL RASH, SWELLING OR PAIN  UNUSUAL VAGINAL DISCHARGE OR ITCHING   Items with * indicate a potential emergency and should be followed up as soon as possible or go to the Emergency Department if any problems should occur.  Please show the CHEMOTHERAPY ALERT  CARD or IMMUNOTHERAPY ALERT CARD at check-in to the Emergency Department and triage nurse.  Should you have questions after your visit or need to cancel or reschedule your appointment, please contact [facility]  Dept: [phone]  and follow the prompts.  Office hours are 8:00 a.m. to 4:30 p.m. Monday - Friday. Please note that voicemails left after 4:00 p.m. may not be returned until the following business day.  We are closed weekends and major holidays. You have access to a nurse at all times for urgent questions. Please call the main number to the clinic Dept: [phone] and follow the prompts.   For any non-urgent questions, you may also contact your provider using MyChart. We now offer e-Visits for anyone 66 and older to request care online for non-urgent symptoms. For details visit [email or web address].   Also download the MyChart app! Go to the app store, search "MyChart", open the app, select [facility], and log in with your MyChart username and password.  Due to Covid, a mask is required upon entering the hospital/clinic. If you do not have a mask, one will be given to you upon arrival. For doctor visits, patients may have 1 support person aged 29 or older with them. For treatment visits, patients cannot have anyone with them due to current Covid guidelines and our immunocompromised population.   Irinotecan injection What is this medication? IRINOTECAN (ir in oh TEE kan ) is a chemotherapy drug. It is used to treat colon and rectal cancer. This medicine may be used for other purposes; ask your health care provider or pharmacist if you  have questions. COMMON BRAND NAME(S): Camptosar What should I tell my care team before I take this medication? They need to know if you have any of these conditions: dehydration diarrhea infection (especially a virus infection such as chickenpox, cold sores, or herpes) liver disease low blood counts, like low white cell,  platelet, or red cell counts low levels of calcium, magnesium, or potassium in the blood recent or ongoing radiation therapy an unusual or allergic reaction to irinotecan, other medicines, foods, dyes, or preservatives pregnant or trying to get pregnant breast-feeding How should I use this medication? This drug is given as an infusion into a vein. It is administered in a hospital or clinic by a specially trained health care professional. Talk to your pediatrician regarding the use of this medicine in children. Special care may be needed. Overdosage: If you think you have taken too much of this medicine contact a poison control center or emergency room at once. NOTE: This medicine is only for you. Do not share this medicine with others. What if I miss a dose? It is important not to miss your dose. Call your doctor or health care professional if you are unable to keep an appointment. What may interact with this medication? Do not take this medicine with any of the following medications: cobicistat itraconazole This medicine may interact with the following medications: antiviral medicines for HIV or AIDS certain antibiotics like rifampin or rifabutin certain medicines for fungal infections like ketoconazole, posaconazole, and voriconazole certain medicines for seizures like carbamazepine, phenobarbital, phenotoin clarithromycin gemfibrozil nefazodone [facility]. John's Wort This list may not describe all possible interactions. Give your health care provider a list of all the medicines, herbs, non-prescription drugs, or dietary supplements you use. Also tell them if you smoke, drink alcohol, or use illegal drugs. Some items may interact with your medicine. What should I watch for while using this medication? Your condition will be monitored carefully while you are receiving this medicine. You will need important blood work done while you are taking this medicine. This drug may make you feel  generally unwell. This is not uncommon, as chemotherapy can affect healthy cells as well as cancer cells. Report any side effects. Continue your course of treatment even though you feel ill unless your doctor tells you to stop. In some cases, you may be given additional medicines to help with side effects. Follow all directions for their use. You may get drowsy or dizzy. Do not drive, use machinery, or do anything that needs mental alertness until you know how this medicine affects you. Do not stand or sit up quickly, especially if you are an older patient. This reduces the risk of dizzy or fainting spells. Call your health care professional for advice if you get a fever, chills, or sore throat, or other symptoms of a cold or flu. Do not treat yourself. This medicine decreases your body's ability to fight infections. Try to avoid being around people who are sick. Avoid taking products that contain aspirin, acetaminophen, ibuprofen, naproxen, or ketoprofen unless instructed by your doctor. These medicines may hide a fever. This medicine may increase your risk to bruise or bleed. Call your doctor or health care professional if you notice any unusual bleeding. Be careful brushing and flossing your teeth or using a toothpick because you may get an infection or bleed more easily. If you have any dental work done, tell your dentist you are receiving this medicine. Do not become pregnant while taking this medicine or for  6 months after stopping it. Women should inform their health care professional if they wish to become pregnant or think they might be pregnant. Men should not father a child while taking this medicine and for 3 months after stopping it. There is potential for serious side effects to an unborn child. Talk to your health care professional for more information. Do not breast-feed an infant while taking this medicine or for 7 days after stopping it. This medicine has caused ovarian failure in some  women. This medicine may make it more difficult to get pregnant. Talk to your health care professional if you are concerned about your fertility. This medicine has caused decreased sperm counts in some men. This may make it more difficult to father a child. Talk to your health care professional if you are concerned about your fertility. What side effects may I notice from receiving this medication? Side effects that you should report to your doctor or health care professional as soon as possible: allergic reactions like skin rash, itching or hives, swelling of the face, lips, or tongue chest pain diarrhea flushing, runny nose, sweating during infusion low blood counts - this medicine may decrease the number of white blood cells, red blood cells and platelets. You may be at increased risk for infections and bleeding. nausea, vomiting pain, swelling, warmth in the leg signs of decreased platelets or bleeding - bruising, pinpoint red spots on the skin, black, tarry stools, blood in the urine signs of infection - fever or chills, cough, sore throat, pain or difficulty passing urine signs of decreased red blood cells - unusually weak or tired, fainting spells, lightheadedness Side effects that usually do not require medical attention (report to your doctor or health care professional if they continue or are bothersome): constipation hair loss headache loss of appetite mouth sores stomach pain This list may not describe all possible side effects. Call your doctor for medical advice about side effects. You may report side effects to FDA at 1-800-FDA-1088. Where should I keep my medication? This drug is given in a hospital or clinic and will not be stored at home. NOTE: This sheet is a summary. It may not cover all possible information. If you have questions about this medicine, talk to your doctor, pharmacist, or health care provider.  2022 Elsevier/Gold Standard (2021-01-08  00:00:00)  Leucovorin injection What is this medication? LEUCOVORIN (loo koe VOR in) is used to prevent or treat the harmful effects of some medicines. This medicine is used to treat anemia caused by a low amount of folic acid in the body. It is also used with 5-fluorouracil (5-FU) to treat colon cancer. This medicine may be used for other purposes; ask your health care provider or pharmacist if you have questions. What should I tell my care team before I take this medication? They need to know if you have any of these conditions: anemia from low levels of vitamin B-12 in the blood an unusual or allergic reaction to leucovorin, folic acid, other medicines, foods, dyes, or preservatives pregnant or trying to get pregnant breast-feeding How should I use this medication? This medicine is for injection into a muscle or into a vein. It is given by a health care professional in a hospital or clinic setting. Talk to your pediatrician regarding the use of this medicine in children. Special care may be needed. Overdosage: If you think you have taken too much of this medicine contact a poison control center or emergency room at once. NOTE: This  medicine is only for you. Do not share this medicine with others. What if I miss a dose? This does not apply. What may interact with this medication? capecitabine fluorouracil phenobarbital phenytoin primidone trimethoprim-sulfamethoxazole This list may not describe all possible interactions. Give your health care provider a list of all the medicines, herbs, non-prescription drugs, or dietary supplements you use. Also tell them if you smoke, drink alcohol, or use illegal drugs. Some items may interact with your medicine. What should I watch for while using this medication? Your condition will be monitored carefully while you are receiving this medicine. This medicine may increase the side effects of 5-fluorouracil, 5-FU. Tell your doctor or health care  professional if you have diarrhea or mouth sores that do not get better or that get worse. What side effects may I notice from receiving this medication? Side effects that you should report to your doctor or health care professional as soon as possible: allergic reactions like skin rash, itching or hives, swelling of the face, lips, or tongue breathing problems fever, infection mouth sores unusual bleeding or bruising unusually weak or tired Side effects that usually do not require medical attention (report to your doctor or health care professional if they continue or are bothersome): constipation or diarrhea loss of appetite nausea, vomiting This list may not describe all possible side effects. Call your doctor for medical advice about side effects. You may report side effects to FDA at 1-800-FDA-1088. Where should I keep my medication? This drug is given in a hospital or clinic and will not be stored at home. NOTE: This sheet is a summary. It may not cover all possible information. If you have questions about this medicine, talk to your doctor, pharmacist, or health care provider.  2022 Elsevier/Gold Standard (2007-10-28 00:00:00)  Fluorouracil, 5-FU injection What is this medication? FLUOROURACIL, 5-FU (flure oh YOOR a sil) is a chemotherapy drug. It slows the growth of cancer cells. This medicine is used to treat many types of cancer like breast cancer, colon or rectal cancer, pancreatic cancer, and stomach cancer. This medicine may be used for other purposes; ask your health care provider or pharmacist if you have questions. COMMON BRAND NAME(S): Adrucil What should I tell my care team before I take this medication? They need to know if you have any of these conditions: blood disorders dihydropyrimidine dehydrogenase (DPD) deficiency infection (especially a virus infection such as chickenpox, cold sores, or herpes) kidney disease liver disease malnourished, poor  nutrition recent or ongoing radiation therapy an unusual or allergic reaction to fluorouracil, other chemotherapy, other medicines, foods, dyes, or preservatives pregnant or trying to get pregnant breast-feeding How should I use this medication? This drug is given as an infusion or injection into a vein. It is administered in a hospital or clinic by a specially trained health care professional. Talk to your pediatrician regarding the use of this medicine in children. Special care may be needed. Overdosage: If you think you have taken too much of this medicine contact a poison control center or emergency room at once. NOTE: This medicine is only for you. Do not share this medicine with others. What if I miss a dose? It is important not to miss your dose. Call your doctor or health care professional if you are unable to keep an appointment. What may interact with this medication? Do not take this medicine with any of the following medications: live virus vaccines This medicine may also interact with the following medications: medicines that treat  or prevent blood clots like warfarin, enoxaparin, and dalteparin This list may not describe all possible interactions. Give your health care provider a list of all the medicines, herbs, non-prescription drugs, or dietary supplements you use. Also tell them if you smoke, drink alcohol, or use illegal drugs. Some items may interact with your medicine. What should I watch for while using this medication? Visit your doctor for checks on your progress. This drug may make you feel generally unwell. This is not uncommon, as chemotherapy can affect healthy cells as well as cancer cells. Report any side effects. Continue your course of treatment even though you feel ill unless your doctor tells you to stop. In some cases, you may be given additional medicines to help with side effects. Follow all directions for their use. Call your doctor or health care  professional for advice if you get a fever, chills or sore throat, or other symptoms of a cold or flu. Do not treat yourself. This drug decreases your body's ability to fight infections. Try to avoid being around people who are sick. This medicine may increase your risk to bruise or bleed. Call your doctor or health care professional if you notice any unusual bleeding. Be careful brushing and flossing your teeth or using a toothpick because you may get an infection or bleed more easily. If you have any dental work done, tell your dentist you are receiving this medicine. Avoid taking products that contain aspirin, acetaminophen, ibuprofen, naproxen, or ketoprofen unless instructed by your doctor. These medicines may hide a fever. Do not become pregnant while taking this medicine. Women should inform their doctor if they wish to become pregnant or think they might be pregnant. There is a potential for serious side effects to an unborn child. Talk to your health care professional or pharmacist for more information. Do not breast-feed an infant while taking this medicine. Men should inform their doctor if they wish to father a child. This medicine may lower sperm counts. Do not treat diarrhea with over the counter products. Contact your doctor if you have diarrhea that lasts more than 2 days or if it is severe and watery. This medicine can make you more sensitive to the sun. Keep out of the sun. If you cannot avoid being in the sun, wear protective clothing and use sunscreen. Do not use sun lamps or tanning beds/booths. What side effects may I notice from receiving this medication? Side effects that you should report to your doctor or health care professional as soon as possible: allergic reactions like skin rash, itching or hives, swelling of the face, lips, or tongue low blood counts - this medicine may decrease the number of white blood cells, red blood cells and platelets. You may be at increased risk for  infections and bleeding. signs of infection - fever or chills, cough, sore throat, pain or difficulty passing urine signs of decreased platelets or bleeding - bruising, pinpoint red spots on the skin, black, tarry stools, blood in the urine signs of decreased red blood cells - unusually weak or tired, fainting spells, lightheadedness breathing problems changes in vision chest pain mouth sores nausea and vomiting pain, swelling, redness at site where injected pain, tingling, numbness in the hands or feet redness, swelling, or sores on hands or feet stomach pain unusual bleeding Side effects that usually do not require medical attention (report to your doctor or health care professional if they continue or are bothersome): changes in finger or toe nails diarrhea dry or  itchy skin hair loss headache loss of appetite sensitivity of eyes to the light stomach upset unusually teary eyes This list may not describe all possible side effects. Call your doctor for medical advice about side effects. You may report side effects to FDA at 1-800-FDA-1088. Where should I keep my medication? This drug is given in a hospital or clinic and will not be stored at home. NOTE: This sheet is a summary. It may not cover all possible information. If you have questions about this medicine, talk to your doctor, pharmacist, or health care provider.  2022 Elsevier/Gold Standard (2021-01-08 00:00:00)  The chemotherapy medication bag should finish at 46 hours, 96 hours, or 7 days. For example, if your pump is scheduled for 46 hours and it was put on at 4:00 p.m., it should finish at 2:00 p.m. the day it is scheduled to come off regardless of your appointment time.     Estimated time to finish at 11:30 a.m. on Thursday 07/05/2021.   If the display on your pump reads "Low Volume" and it is beeping, take the batteries out of the pump and come to the cancer center for it to be taken off.   If the pump alarms go off  prior to the pump reading "Low Volume" then call [phone] and someone can assist you.  If the plunger comes out and the chemotherapy medication is leaking out, please use your home chemo spill kit to clean up the spill. Do NOT use paper towels or other household products.  If you have problems or questions regarding your pump, please call either [phone] (24 hours a day) or the cancer center Monday-Friday 8:00 a.m.- 4:30 p.m. at the clinic number and we will assist you. If you are unable to get assistance, then go to the nearest Emergency Department and ask the staff to contact the IV team for assistance.

## 2021-07-04 ENCOUNTER — Other Ambulatory Visit: Payer: Self-pay

## 2021-07-04 ENCOUNTER — Other Ambulatory Visit: Payer: Self-pay | Admitting: *Deleted

## 2021-07-04 DIAGNOSIS — C189 Malignant neoplasm of colon, unspecified: Secondary | ICD-10-CM

## 2021-07-05 ENCOUNTER — Inpatient Hospital Stay: Payer: Medicare Other

## 2021-07-05 ENCOUNTER — Other Ambulatory Visit: Payer: Self-pay

## 2021-07-05 VITALS — BP 128/80 | HR 63 | Temp 97.9°F | Resp 18

## 2021-07-05 DIAGNOSIS — C774 Secondary and unspecified malignant neoplasm of inguinal and lower limb lymph nodes: Secondary | ICD-10-CM | POA: Diagnosis not present

## 2021-07-05 DIAGNOSIS — Z5111 Encounter for antineoplastic chemotherapy: Secondary | ICD-10-CM | POA: Diagnosis not present

## 2021-07-05 DIAGNOSIS — K1231 Oral mucositis (ulcerative) due to antineoplastic therapy: Secondary | ICD-10-CM | POA: Diagnosis not present

## 2021-07-05 DIAGNOSIS — C189 Malignant neoplasm of colon, unspecified: Secondary | ICD-10-CM

## 2021-07-05 DIAGNOSIS — C187 Malignant neoplasm of sigmoid colon: Secondary | ICD-10-CM | POA: Diagnosis not present

## 2021-07-05 DIAGNOSIS — N189 Chronic kidney disease, unspecified: Secondary | ICD-10-CM | POA: Diagnosis not present

## 2021-07-05 DIAGNOSIS — G620 Drug-induced polyneuropathy: Secondary | ICD-10-CM | POA: Diagnosis not present

## 2021-07-05 MED ORDER — HEPARIN SOD (PORK) LOCK FLUSH 100 UNIT/ML IV SOLN
500.0000 [IU] | Freq: Once | INTRAVENOUS | Status: AC | PRN
  Administered 2021-07-05: 500 [IU]

## 2021-07-05 MED ORDER — SODIUM CHLORIDE 0.9% FLUSH
10.0000 mL | INTRAVENOUS | Status: DC | PRN
  Administered 2021-07-05: 10 mL

## 2021-07-05 NOTE — Patient Instructions (Signed)

## 2021-07-12 ENCOUNTER — Inpatient Hospital Stay: Payer: Medicare Other

## 2021-07-12 ENCOUNTER — Other Ambulatory Visit: Payer: Self-pay

## 2021-07-12 ENCOUNTER — Telehealth: Payer: Self-pay

## 2021-07-12 DIAGNOSIS — K1231 Oral mucositis (ulcerative) due to antineoplastic therapy: Secondary | ICD-10-CM | POA: Diagnosis not present

## 2021-07-12 DIAGNOSIS — C187 Malignant neoplasm of sigmoid colon: Secondary | ICD-10-CM | POA: Diagnosis not present

## 2021-07-12 DIAGNOSIS — G620 Drug-induced polyneuropathy: Secondary | ICD-10-CM | POA: Diagnosis not present

## 2021-07-12 DIAGNOSIS — N189 Chronic kidney disease, unspecified: Secondary | ICD-10-CM | POA: Diagnosis not present

## 2021-07-12 DIAGNOSIS — Z5111 Encounter for antineoplastic chemotherapy: Secondary | ICD-10-CM | POA: Diagnosis not present

## 2021-07-12 DIAGNOSIS — C774 Secondary and unspecified malignant neoplasm of inguinal and lower limb lymph nodes: Secondary | ICD-10-CM | POA: Diagnosis not present

## 2021-07-12 DIAGNOSIS — C189 Malignant neoplasm of colon, unspecified: Secondary | ICD-10-CM

## 2021-07-12 LAB — CBC WITH DIFFERENTIAL (CANCER CENTER ONLY)
Abs Immature Granulocytes: 0.04 10*3/uL (ref 0.00–0.07)
Basophils Absolute: 0 10*3/uL (ref 0.0–0.1)
Basophils Relative: 1 %
Eosinophils Absolute: 0.2 10*3/uL (ref 0.0–0.5)
Eosinophils Relative: 4 %
HCT: 29.7 % — ABNORMAL LOW (ref 39.0–52.0)
Hemoglobin: 9.2 g/dL — ABNORMAL LOW (ref 13.0–17.0)
Immature Granulocytes: 1 %
Lymphocytes Relative: 26 %
Lymphs Abs: 1.3 10*3/uL (ref 0.7–4.0)
MCH: 27.4 pg (ref 26.0–34.0)
MCHC: 31 g/dL (ref 30.0–36.0)
MCV: 88.4 fL (ref 80.0–100.0)
Monocytes Absolute: 0.4 10*3/uL (ref 0.1–1.0)
Monocytes Relative: 8 %
Neutro Abs: 3 10*3/uL (ref 1.7–7.7)
Neutrophils Relative %: 60 %
Platelet Count: 137 10*3/uL — ABNORMAL LOW (ref 150–400)
RBC: 3.36 MIL/uL — ABNORMAL LOW (ref 4.22–5.81)
RDW: 20 % — ABNORMAL HIGH (ref 11.5–15.5)
WBC Count: 4.9 10*3/uL (ref 4.0–10.5)
nRBC: 0 % (ref 0.0–0.2)

## 2021-07-12 NOTE — Telephone Encounter (Signed)
V/m message left relaying message. ?

## 2021-07-12 NOTE — Telephone Encounter (Signed)
-----   Message from Owens Shark, NP sent at 07/12/2021  1:09 PM EST ----- ?Please let him know the white count is better.  Follow-up as scheduled. ? ?

## 2021-07-19 ENCOUNTER — Other Ambulatory Visit: Payer: Self-pay | Admitting: Oncology

## 2021-07-23 NOTE — Progress Notes (Signed)
The following biosimilar Mvasi (bevacizumab-awwb) has been selected for use in this patient. Zirabev unavailable. Insurance approved Mvasi switch for this treatment ? ?

## 2021-07-24 ENCOUNTER — Encounter: Payer: Self-pay | Admitting: *Deleted

## 2021-07-24 ENCOUNTER — Inpatient Hospital Stay (HOSPITAL_BASED_OUTPATIENT_CLINIC_OR_DEPARTMENT_OTHER): Payer: Medicare Other | Admitting: Oncology

## 2021-07-24 ENCOUNTER — Inpatient Hospital Stay: Payer: Medicare Other

## 2021-07-24 ENCOUNTER — Other Ambulatory Visit: Payer: Self-pay

## 2021-07-24 VITALS — BP 146/68 | HR 73 | Temp 97.7°F | Resp 18 | Ht 76.0 in | Wt 240.6 lb

## 2021-07-24 VITALS — BP 146/70 | HR 55

## 2021-07-24 DIAGNOSIS — C189 Malignant neoplasm of colon, unspecified: Secondary | ICD-10-CM

## 2021-07-24 DIAGNOSIS — N189 Chronic kidney disease, unspecified: Secondary | ICD-10-CM | POA: Diagnosis not present

## 2021-07-24 DIAGNOSIS — C774 Secondary and unspecified malignant neoplasm of inguinal and lower limb lymph nodes: Secondary | ICD-10-CM | POA: Diagnosis not present

## 2021-07-24 DIAGNOSIS — Z5111 Encounter for antineoplastic chemotherapy: Secondary | ICD-10-CM | POA: Diagnosis not present

## 2021-07-24 DIAGNOSIS — K1231 Oral mucositis (ulcerative) due to antineoplastic therapy: Secondary | ICD-10-CM | POA: Diagnosis not present

## 2021-07-24 DIAGNOSIS — C187 Malignant neoplasm of sigmoid colon: Secondary | ICD-10-CM | POA: Diagnosis not present

## 2021-07-24 DIAGNOSIS — G620 Drug-induced polyneuropathy: Secondary | ICD-10-CM | POA: Diagnosis not present

## 2021-07-24 LAB — CMP (CANCER CENTER ONLY)
ALT: 10 U/L (ref 0–44)
AST: 9 U/L — ABNORMAL LOW (ref 15–41)
Albumin: 4 g/dL (ref 3.5–5.0)
Alkaline Phosphatase: 107 U/L (ref 38–126)
Anion gap: 8 (ref 5–15)
BUN: 33 mg/dL — ABNORMAL HIGH (ref 8–23)
CO2: 22 mmol/L (ref 22–32)
Calcium: 9.1 mg/dL (ref 8.9–10.3)
Chloride: 109 mmol/L (ref 98–111)
Creatinine: 2.34 mg/dL — ABNORMAL HIGH (ref 0.61–1.24)
GFR, Estimated: 28 mL/min — ABNORMAL LOW (ref >60)
Glucose, Bld: 142 mg/dL — ABNORMAL HIGH (ref 70–99)
Potassium: 4.1 mmol/L (ref 3.5–5.1)
Sodium: 139 mmol/L (ref 135–145)
Total Bilirubin: 0.4 mg/dL (ref 0.3–1.2)
Total Protein: 6.9 g/dL (ref 6.5–8.1)

## 2021-07-24 LAB — CBC WITH DIFFERENTIAL (CANCER CENTER ONLY)
Abs Immature Granulocytes: 0.01 10*3/uL (ref 0.00–0.07)
Basophils Absolute: 0 10*3/uL (ref 0.0–0.1)
Basophils Relative: 1 %
Eosinophils Absolute: 0.2 10*3/uL (ref 0.0–0.5)
Eosinophils Relative: 7 %
HCT: 30.7 % — ABNORMAL LOW (ref 39.0–52.0)
Hemoglobin: 9.7 g/dL — ABNORMAL LOW (ref 13.0–17.0)
Immature Granulocytes: 0 %
Lymphocytes Relative: 31 %
Lymphs Abs: 1.1 10*3/uL (ref 0.7–4.0)
MCH: 27.8 pg (ref 26.0–34.0)
MCHC: 31.6 g/dL (ref 30.0–36.0)
MCV: 88 fL (ref 80.0–100.0)
Monocytes Absolute: 0.5 10*3/uL (ref 0.1–1.0)
Monocytes Relative: 13 %
Neutro Abs: 1.6 10*3/uL — ABNORMAL LOW (ref 1.7–7.7)
Neutrophils Relative %: 48 %
Platelet Count: 166 10*3/uL (ref 150–400)
RBC: 3.49 MIL/uL — ABNORMAL LOW (ref 4.22–5.81)
RDW: 19.5 % — ABNORMAL HIGH (ref 11.5–15.5)
WBC Count: 3.4 10*3/uL — ABNORMAL LOW (ref 4.0–10.5)
nRBC: 0 % (ref 0.0–0.2)

## 2021-07-24 LAB — TOTAL PROTEIN, URINE DIPSTICK: Protein, ur: 30 mg/dL — AB

## 2021-07-24 MED ORDER — SODIUM CHLORIDE 0.9 % IV SOLN
1800.0000 mg/m2 | INTRAVENOUS | Status: DC
  Administered 2021-07-24: 4350 mg via INTRAVENOUS
  Filled 2021-07-24: qty 87

## 2021-07-24 MED ORDER — SODIUM CHLORIDE 0.9 % IV SOLN
300.0000 mg/m2 | Freq: Once | INTRAVENOUS | Status: AC
  Administered 2021-07-24: 726 mg via INTRAVENOUS
  Filled 2021-07-24: qty 36.3

## 2021-07-24 MED ORDER — SODIUM CHLORIDE 0.9 % IV SOLN: Freq: Once | INTRAVENOUS | Status: AC

## 2021-07-24 MED ORDER — ATROPINE SULFATE 1 MG/ML IV SOLN
0.5000 mg | Freq: Once | INTRAVENOUS | Status: AC | PRN
  Administered 2021-07-24: 0.5 mg via INTRAVENOUS
  Filled 2021-07-24: qty 1

## 2021-07-24 MED ORDER — SODIUM CHLORIDE 0.9 % IV SOLN
120.0000 mg/m2 | Freq: Once | INTRAVENOUS | Status: AC
  Administered 2021-07-24: 300 mg via INTRAVENOUS
  Filled 2021-07-24: qty 15

## 2021-07-24 MED ORDER — SODIUM CHLORIDE 0.9 % IV SOLN
10.0000 mg | Freq: Once | INTRAVENOUS | Status: AC
  Administered 2021-07-24: 10 mg via INTRAVENOUS
  Filled 2021-07-24: qty 1

## 2021-07-24 MED ORDER — PALONOSETRON HCL INJECTION 0.25 MG/5ML
0.2500 mg | Freq: Once | INTRAVENOUS | Status: AC
  Administered 2021-07-24: 0.25 mg via INTRAVENOUS
  Filled 2021-07-24: qty 5

## 2021-07-24 MED ORDER — FLUOROURACIL CHEMO INJECTION 2.5 GM/50ML
300.0000 mg/m2 | Freq: Once | INTRAVENOUS | Status: AC
  Administered 2021-07-24: 750 mg via INTRAVENOUS
  Filled 2021-07-24: qty 15

## 2021-07-24 MED ORDER — SODIUM CHLORIDE 0.9 % IV SOLN
150.0000 mg | Freq: Once | INTRAVENOUS | Status: AC
  Administered 2021-07-24: 150 mg via INTRAVENOUS
  Filled 2021-07-24: qty 5

## 2021-07-24 NOTE — Progress Notes (Signed)
Patient presents for treatment. RN assessment completed along with the following: ? ?Labs/vitals reviewed - Yes, and Per Dr. Benay Spice, ok to treat with ANC 1.6    ?Weight within 10% of previous measurement - Yes ?Informed consent completed and reflects current therapy/intent - Yes, on date 02/27/21             ?Provider progress note reviewed - Yes, today's provider note was reviewed. ?Treatment/Antibody/Supportive plan reviewed - Yes, and patient will not be receiving bevacizumab.  ?S&H and other orders reviewed - Yes, and there are no additional orders identified. ?Previous treatment date reviewed - Yes, and the appropriate amount of time has elapsed between treatments. ?Clinic Hand Off Received from - Merceda Elks, RN ? ?Patient to proceed with treatment.   ?

## 2021-07-24 NOTE — Patient Instructions (Addendum)
[facility]   ?Discharge Instructions: ?Thank you for choosing [facility] to provide your oncology and hematology care.  ? ?If you have a lab appointment with the [facility], please go directly to the [facility] and check in at the registration area. ?  ?Wear comfortable clothing and clothing appropriate for easy access to any Portacath or PICC line.  ? ?We strive to give you quality time with your provider. You may need to reschedule your appointment if you arrive late (15 or more minutes).  Arriving late affects you and other patients whose appointments are after yours.  Also, if you miss three or more appointments without notifying the office, you may be dismissed from the clinic at the provider?s discretion.    ?  ?For prescription refill requests, have your pharmacy contact our office and allow 72 hours for refills to be completed.   ? ?Today you received the following chemotherapy and/or immunotherapy agents Irinotecan (CAMPTOSAR), Leucovorin & Flourouracil (ADRUCIL).    ?  ?To help prevent nausea and vomiting after your treatment, we encourage you to take your nausea medication as directed. ? ?BELOW ARE SYMPTOMS THAT SHOULD BE REPORTED IMMEDIATELY: ?*FEVER GREATER THAN 100.4 F (38 ?C) OR HIGHER ?*CHILLS OR SWEATING ?*NAUSEA AND VOMITING THAT IS NOT CONTROLLED WITH YOUR NAUSEA MEDICATION ?*UNUSUAL SHORTNESS OF BREATH ?*UNUSUAL BRUISING OR BLEEDING ?*URINARY PROBLEMS (pain or burning when urinating, or frequent urination) ?*BOWEL PROBLEMS (unusual diarrhea, constipation, pain near the anus) ?TENDERNESS IN MOUTH AND THROAT WITH OR WITHOUT PRESENCE OF ULCERS (sore throat, sores in mouth, or a toothache) ?UNUSUAL RASH, SWELLING OR PAIN  ?UNUSUAL VAGINAL DISCHARGE OR ITCHING  ? ?Items with * indicate a potential emergency and should be followed up as soon as possible or go to the Emergency Department if any problems should occur. ? ?Please show the CHEMOTHERAPY ALERT  CARD or IMMUNOTHERAPY ALERT CARD at check-in to the Emergency Department and triage nurse. ? ?Should you have questions after your visit or need to cancel or reschedule your appointment, please contact [facility]  Dept: [phone]  and follow the prompts.  Office hours are 8:00 a.m. to 4:30 p.m. Monday - Friday. Please note that voicemails left after 4:00 p.m. may not be returned until the following business day.  We are closed weekends and major holidays. You have access to a nurse at all times for urgent questions. Please call the main number to the clinic Dept: [phone] and follow the prompts. ? ? ?For any non-urgent questions, you may also contact your provider using MyChart. We now offer e-Visits for anyone 60 and older to request care online for non-urgent symptoms. For details visit [email or web address]. ?  ?Also download the MyChart app! Go to the app store, search "MyChart", open the app, select [facility], and log in with your MyChart username and password. ? ?Due to Covid, a mask is required upon entering the hospital/clinic. If you do not have a mask, one will be given to you upon arrival. For doctor visits, patients may have 1 support person aged 54 or older with them. For treatment visits, patients cannot have anyone with them due to current Covid guidelines and our immunocompromised population.  ? ?Irinotecan injection ?What is this medication? ?IRINOTECAN (ir in oh TEE kan ) is a chemotherapy drug. It is used to treat colon and rectal cancer. ?This medicine may be used for other purposes; ask your health care provider or pharmacist if you  have questions. ?COMMON BRAND NAME(S): Camptosar ?What should I tell my care team before I take this medication? ?They need to know if you have any of these conditions: ?dehydration ?diarrhea ?infection (especially a virus infection such as chickenpox, cold sores, or herpes) ?liver disease ?low blood counts, like low white cell,  platelet, or red cell counts ?low levels of calcium, magnesium, or potassium in the blood ?recent or ongoing radiation therapy ?an unusual or allergic reaction to irinotecan, other medicines, foods, dyes, or preservatives ?pregnant or trying to get pregnant ?breast-feeding ?How should I use this medication? ?This drug is given as an infusion into a vein. It is administered in a hospital or clinic by a specially trained health care professional. ?Talk to your pediatrician regarding the use of this medicine in children. Special care may be needed. ?Overdosage: If you think you have taken too much of this medicine contact a poison control center or emergency room at once. ?NOTE: This medicine is only for you. Do not share this medicine with others. ?What if I miss a dose? ?It is important not to miss your dose. Call your doctor or health care professional if you are unable to keep an appointment. ?What may interact with this medication? ?Do not take this medicine with any of the following medications: ?cobicistat ?itraconazole ?This medicine may interact with the following medications: ?antiviral medicines for HIV or AIDS ?certain antibiotics like rifampin or rifabutin ?certain medicines for fungal infections like ketoconazole, posaconazole, and voriconazole ?certain medicines for seizures like carbamazepine, phenobarbital, phenotoin ?clarithromycin ?gemfibrozil ?nefazodone ?[place] ?This list may not describe all possible interactions. Give your health care provider a list of all the medicines, herbs, non-prescription drugs, or dietary supplements you use. Also tell them if you smoke, drink alcohol, or use illegal drugs. Some items may interact with your medicine. ?What should I watch for while using this medication? ?Your condition will be monitored carefully while you are receiving this medicine. You will need important blood work done while you are taking this medicine. ?This drug may make you feel  generally unwell. This is not uncommon, as chemotherapy can affect healthy cells as well as cancer cells. Report any side effects. Continue your course of treatment even though you feel ill unless your doctor tells you to stop. ?In some cases, you may be given additional medicines to help with side effects. Follow all directions for their use. ?You may get drowsy or dizzy. Do not drive, use machinery, or do anything that needs mental alertness until you know how this medicine affects you. Do not stand or sit up quickly, especially if you are an older patient. This reduces the risk of dizzy or fainting spells. ?Call your health care professional for advice if you get a fever, chills, or sore throat, or other symptoms of a cold or flu. Do not treat yourself. This medicine decreases your body's ability to fight infections. Try to avoid being around people who are sick. ?Avoid taking products that contain aspirin, acetaminophen, ibuprofen, naproxen, or ketoprofen unless instructed by your doctor. These medicines may hide a fever. ?This medicine may increase your risk to bruise or bleed. Call your doctor or health care professional if you notice any unusual bleeding. ?Be careful brushing and flossing your teeth or using a toothpick because you may get an infection or bleed more easily. If you have any dental work done, tell your dentist you are receiving this medicine. ?Do not become pregnant while taking this medicine or for  6 months after stopping it. Women should inform their health care professional if they wish to become pregnant or think they might be pregnant. Men should not father a child while taking this medicine and for 3 months after stopping it. There is potential for serious side effects to an unborn child. Talk to your health care professional for more information. ?Do not breast-feed an infant while taking this medicine or for 7 days after stopping it. ?This medicine has caused ovarian failure in some  women. This medicine may make it more difficult to get pregnant. Talk to your health care professional if you are concerned about your fertility. ?This medicine has caused decreased sperm counts in some men. T

## 2021-07-24 NOTE — Progress Notes (Signed)
?[facility] ?OFFICE PROGRESS NOTE ? ? ?Diagnosis: Colon cancer ? ?INTERVAL HISTORY:  ? ?Louis [name] completed another cycle of FOLFIRI on 07/03/2021.  No nausea/vomiting, mouth sores, or diarrhea.  He had malaise for 3 to 4 days following chemotherapy.  He feels well at present.  The left axillary lymph node is smaller. ? ?Objective: ? ?Vital signs in last 24 hours: ? ?Blood pressure (!) 146/68, pulse 73, temperature 97.7 ?F (36.5 ?C), temperature source Oral, resp. rate 18, height '6\' 4"'  (1.93 m), weight 240 lb 9.6 oz (109.1 kg), SpO2 99 %. ?  ? ?HEENT: No thrush or ulcers ?Lymphatics: 1-2 cm mobile left axillary node, 3-4 cm left inguinal node, superior and lateral to the firm node there is a less than 0.5-1 cm firm mobile cutaneous nodular lesion ?Resp: Lungs clear bilaterally ?Cardio: Regular rate and rhythm  ?GI: No hepatosplenomegaly, no mass, nontender ?Vascular: No leg edema  ? ?Portacath/PICC-without erythema ? ?Lab Results: ? ?Lab Results  ?Component Value Date  ? WBC 3.4 (L) 07/24/2021  ? HGB 9.7 (L) 07/24/2021  ? HCT 30.7 (L) 07/24/2021  ? MCV 88.0 07/24/2021  ? PLT 166 07/24/2021  ? NEUTROABS 1.6 (L) 07/24/2021  ? ? ?CMP  ?Lab Results  ?Component Value Date  ? NA 138 07/03/2021  ? K 4.4 07/03/2021  ? CL 108 07/03/2021  ? CO2 22 07/03/2021  ? GLUCOSE 160 (H) 07/03/2021  ? BUN 32 (H) 07/03/2021  ? CREATININE 2.41 (H) 07/03/2021  ? CALCIUM 8.9 07/03/2021  ? PROT 6.7 07/03/2021  ? ALBUMIN 4.0 07/03/2021  ? AST 9 (L) 07/03/2021  ? ALT 8 07/03/2021  ? ALKPHOS 102 07/03/2021  ? BILITOT 0.3 07/03/2021  ? GFRNONAA 27 (L) 07/03/2021  ? GFRAA 28 (L) 09/20/2019  ? ? ?Lab Results  ?Component Value Date  ? CEA1 2.61 01/02/2021  ? CEA 3.76 01/02/2021  ? ? ?Medications: I have reviewed the patient's current medications. ? ? 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), isolated mesenteric implant-resected, multiple tumor deposits ?Sigmoid/descending resection and creation of a descending  colostomy 04/10/2016 ?MSI-stable, no loss of mismatch repair protein expression ?Foundation 1- BRAF V600E positive, MS-stable, intermediate tumor mutation burden, no RAS mutation ?CT abdomen/pelvis 04/02/2016-no evidence of distant metastatic disease ?Cycle 1 FOLFOX 05/21/2016 ?Cycle 2 FOLFOX 06/04/2016 ?Cycle 3 FOLFOX 06/18/2016 ?Cycle 4 FOLFOX 07/02/2016 (oxaliplatin held secondary to thrombocytopenia) ?Cycle 5 FOLFOX 07/16/2016 ?Cycle 6 FOLFOX 07/30/2016 ?Cycle 7 FOLFOX 08/13/2016 (oxaliplatin held and 5-FU dose reduced) ?Cycle 8 FOLFOX 09/03/2016 (oxaliplatin held secondary to neuropathy) ?Cycle 9 FOLFOX 09/17/2016 (oxaliplatin held secondary to neuropathy) ?Cycle 10 FOLFOX 10/02/2016 ?Cycle 11 FOLFOX 10/15/2016 (oxaliplatin eliminated from the regimen) ?Cycle 12 FOLFOX 10/30/2016 (oxaliplatin eliminated) ?CTs 05/12/2017-no evidence of recurrent disease, right inguinal hernia containing bladder ?Colonoscopy 07/21/2017, 3 polyps were removed from the descending and transverse colon, fragments of tubular and tubulovillous adenoma ?CTs 05/19/2018-no evidence of recurrent disease, status post hernia repair, right upper lobe pneumonia ?CTs 05/20/2019-resolution of right upper lobe pneumonia, no evidence of metastatic disease ?Colonoscopy 01/25/2020-multiple polyps removed-tubular adenomas, poor preparation, repeat colonoscopy recommended ?CTs neck, chest, and abdomen/pelvis 08/27/2020- new 2 centimeter left axillary node, new 1.9 cm left inguinal node chronic mildly prominent portacaval node ?Ultrasound biopsy of left inguinal node on 10/29/2020-metastatic adenocarcinoma consistent with colon adenocarcinoma ?PET 08/04/270-ZDGUYQIH hypermetabolic left inguinal and left axillary nodes, solitary focus of hypermetabolic activity in the right lower quadrant small bowel ?Cycle 1 FOLFOX 12/19/2020 ?Cycle 2 FOLFOX 01/02/2021, oxaliplatin dose reduced secondary to neutropenia  and thrombocytopenia ?Cycle 3 FOLFOX 01/16/2021 ?Cycle 4  FOLFOX 01/30/2021 ?CTs 02/11/2021-no change in left inguinal and left axillary nodes, no evidence of disease progression, stable wall thickening involving a loop of distal ileum ?Cycle 1 FOLFIRI/Avastin 02/27/2021 ?Cycle 2 FOLFIRI/Avastin 03/13/2021 ?Cycle 3 FOLFIRI/Avastin 04/03/2021 ?Cycle 4 FOLFIRI 04/17/2021, Avastin held due to elevated urine protein ?05/07/2021-24-hour urine protein elevated 1562 ?Cycle 5 FOLFIRI 05/08/2021, Avastin held due to elevated urine protein, irinotecan dose reduced due to in general not feeling well after treatment ?CTs 05/17/2021-left axillary and left inguinal lymph nodes are smaller, no evidence of disease progression ?Cycle 6 FOLFIRI 05/22/2021, Avastin held due to proteinuria ?06/03/2021 24-hour urine with 1.9 g of protein ?Cycle 7 FOLFIRI 06/12/2021, Avastin held due to proteinuria ?Cycle 8 FOLFIRI 07/03/2021, Avastin held due to proteinuria ?Cycle 9 FOLFIRI 07/24/2021, Avastin held due to proteinuria ?  ?2.   Chronic renal insufficiency ?  ?3.   Hypertension ?  ?4.  Inflamed sebaceous cyst at the upper back-status post incision and drainage ?  ?5.  Thrombocytopenia secondary chemotherapy-oxaliplatin dose reduced beginning with cycle 3 FOLFOX, oxaliplatin held with cycle 4 and cycle 7 FOLFOX ?  ?6.  Oxaliplatin neuropathy-improved ?  ?7.  History of Mucositis secondary chemotherapy ?  ?8.  Symptoms of pneumonia January 2020-CT 05/19/2018 consistent with right upper lobe pneumonia, Levaquin prescribed 05/20/2018 ?  ?9.  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  ?10.  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  ?  ?Disposition: ?Louis [name] appears stable.  He will complete another cycle FOLFIRI today.  Bevacizumab will remain on hold until he returns another 24-hour urine.  He says he will return the urine sample next week. ? ?He plans a vacation to [place] in 2 weeks.  The next cycle of FOLFIRI will be scheduled f on 08/19/2021.  He will be referred for a restaging CT evaluation after the  08/19/2021 cycle of chemotherapy. ? ? ? ?He has mild neutropenia today.  He knows to call for a fever or symptoms of infection.  He will return for nadir CBC on 08/06/2021. ? ?He will be scheduled for an office visit 08/19/2021. ? ?Betsy Coder, MD ? ?07/24/2021  ?10:12 AM ? ? ?

## 2021-07-24 NOTE — Patient Instructions (Signed)

## 2021-07-24 NOTE — Progress Notes (Signed)
Patient seen by Dr. Benay Spice today ? ?Vitals are within treatment parameters. ? ?Labs reviewed by Dr. Benay Spice and are not all within treatment parameters. Proceed with ANC 1.6  today. Patient will return for nadir count. ? ?Per physician team, patient is ready for treatment. Please note that modifications are being made to the treatment plan including Hold Avastin today-patient will return 24 hour urine prior to next treatment.   ?

## 2021-07-26 ENCOUNTER — Inpatient Hospital Stay: Payer: Medicare Other

## 2021-07-26 ENCOUNTER — Other Ambulatory Visit: Payer: Self-pay

## 2021-07-26 VITALS — BP 143/65 | HR 62 | Temp 98.2°F | Resp 18

## 2021-07-26 DIAGNOSIS — K1231 Oral mucositis (ulcerative) due to antineoplastic therapy: Secondary | ICD-10-CM | POA: Diagnosis not present

## 2021-07-26 DIAGNOSIS — C774 Secondary and unspecified malignant neoplasm of inguinal and lower limb lymph nodes: Secondary | ICD-10-CM | POA: Diagnosis not present

## 2021-07-26 DIAGNOSIS — C187 Malignant neoplasm of sigmoid colon: Secondary | ICD-10-CM | POA: Diagnosis not present

## 2021-07-26 DIAGNOSIS — Z5111 Encounter for antineoplastic chemotherapy: Secondary | ICD-10-CM | POA: Diagnosis not present

## 2021-07-26 DIAGNOSIS — G620 Drug-induced polyneuropathy: Secondary | ICD-10-CM | POA: Diagnosis not present

## 2021-07-26 DIAGNOSIS — Z95828 Presence of other vascular implants and grafts: Secondary | ICD-10-CM

## 2021-07-26 DIAGNOSIS — N189 Chronic kidney disease, unspecified: Secondary | ICD-10-CM | POA: Diagnosis not present

## 2021-07-26 MED ORDER — HEPARIN SOD (PORK) LOCK FLUSH 100 UNIT/ML IV SOLN
500.0000 [IU] | Freq: Once | INTRAVENOUS | Status: AC | PRN
  Administered 2021-07-26: 500 [IU] via INTRAVENOUS

## 2021-07-26 MED ORDER — SODIUM CHLORIDE 0.9% FLUSH
10.0000 mL | INTRAVENOUS | Status: DC | PRN
  Administered 2021-07-26: 10 mL via INTRAVENOUS

## 2021-08-05 ENCOUNTER — Other Ambulatory Visit (HOSPITAL_BASED_OUTPATIENT_CLINIC_OR_DEPARTMENT_OTHER): Payer: Self-pay

## 2021-08-05 DIAGNOSIS — C189 Malignant neoplasm of colon, unspecified: Secondary | ICD-10-CM

## 2021-08-05 LAB — PROTEIN, URINE, 24 HOUR
Collection Interval-UPROT: 24 hours
Protein, 24H Urine: 1820 mg/d — ABNORMAL HIGH (ref 50–100)
Protein, Urine: 65 mg/dL
Urine Total Volume-UPROT: 2800 mL

## 2021-08-06 ENCOUNTER — Inpatient Hospital Stay: Payer: Medicare Other

## 2021-08-06 ENCOUNTER — Other Ambulatory Visit: Payer: Self-pay | Admitting: Oncology

## 2021-08-06 ENCOUNTER — Inpatient Hospital Stay: Payer: Medicare Other | Attending: Oncology

## 2021-08-06 DIAGNOSIS — I10 Essential (primary) hypertension: Secondary | ICD-10-CM | POA: Diagnosis not present

## 2021-08-06 DIAGNOSIS — C189 Malignant neoplasm of colon, unspecified: Secondary | ICD-10-CM

## 2021-08-06 DIAGNOSIS — D696 Thrombocytopenia, unspecified: Secondary | ICD-10-CM | POA: Insufficient documentation

## 2021-08-06 DIAGNOSIS — Z79899 Other long term (current) drug therapy: Secondary | ICD-10-CM | POA: Insufficient documentation

## 2021-08-06 DIAGNOSIS — N289 Disorder of kidney and ureter, unspecified: Secondary | ICD-10-CM | POA: Insufficient documentation

## 2021-08-06 DIAGNOSIS — C187 Malignant neoplasm of sigmoid colon: Secondary | ICD-10-CM | POA: Diagnosis not present

## 2021-08-06 DIAGNOSIS — Z5111 Encounter for antineoplastic chemotherapy: Secondary | ICD-10-CM | POA: Diagnosis not present

## 2021-08-06 LAB — CBC WITH DIFFERENTIAL (CANCER CENTER ONLY)
Abs Immature Granulocytes: 0.01 10*3/uL (ref 0.00–0.07)
Basophils Absolute: 0 10*3/uL (ref 0.0–0.1)
Basophils Relative: 1 %
Eosinophils Absolute: 0.2 10*3/uL (ref 0.0–0.5)
Eosinophils Relative: 6 %
HCT: 31.1 % — ABNORMAL LOW (ref 39.0–52.0)
Hemoglobin: 9.7 g/dL — ABNORMAL LOW (ref 13.0–17.0)
Immature Granulocytes: 0 %
Lymphocytes Relative: 24 %
Lymphs Abs: 1 10*3/uL (ref 0.7–4.0)
MCH: 27.3 pg (ref 26.0–34.0)
MCHC: 31.2 g/dL (ref 30.0–36.0)
MCV: 87.6 fL (ref 80.0–100.0)
Monocytes Absolute: 0.6 10*3/uL (ref 0.1–1.0)
Monocytes Relative: 13 %
Neutro Abs: 2.4 10*3/uL (ref 1.7–7.7)
Neutrophils Relative %: 56 %
Platelet Count: 132 10*3/uL — ABNORMAL LOW (ref 150–400)
RBC: 3.55 MIL/uL — ABNORMAL LOW (ref 4.22–5.81)
RDW: 18.4 % — ABNORMAL HIGH (ref 11.5–15.5)
WBC Count: 4.2 10*3/uL (ref 4.0–10.5)
nRBC: 0 % (ref 0.0–0.2)

## 2021-08-13 ENCOUNTER — Other Ambulatory Visit: Payer: Self-pay | Admitting: Oncology

## 2021-08-19 ENCOUNTER — Inpatient Hospital Stay: Payer: Medicare Other

## 2021-08-19 ENCOUNTER — Encounter: Payer: Self-pay | Admitting: *Deleted

## 2021-08-19 ENCOUNTER — Inpatient Hospital Stay (HOSPITAL_BASED_OUTPATIENT_CLINIC_OR_DEPARTMENT_OTHER): Payer: Medicare Other | Admitting: Oncology

## 2021-08-19 VITALS — BP 149/71 | HR 75 | Temp 98.2°F | Resp 18 | Ht 76.0 in | Wt 241.0 lb

## 2021-08-19 DIAGNOSIS — C189 Malignant neoplasm of colon, unspecified: Secondary | ICD-10-CM

## 2021-08-19 DIAGNOSIS — Z79899 Other long term (current) drug therapy: Secondary | ICD-10-CM | POA: Diagnosis not present

## 2021-08-19 DIAGNOSIS — Z5111 Encounter for antineoplastic chemotherapy: Secondary | ICD-10-CM | POA: Diagnosis not present

## 2021-08-19 DIAGNOSIS — C187 Malignant neoplasm of sigmoid colon: Secondary | ICD-10-CM | POA: Diagnosis not present

## 2021-08-19 DIAGNOSIS — N289 Disorder of kidney and ureter, unspecified: Secondary | ICD-10-CM | POA: Diagnosis not present

## 2021-08-19 DIAGNOSIS — D696 Thrombocytopenia, unspecified: Secondary | ICD-10-CM | POA: Diagnosis not present

## 2021-08-19 DIAGNOSIS — I10 Essential (primary) hypertension: Secondary | ICD-10-CM | POA: Diagnosis not present

## 2021-08-19 LAB — CBC WITH DIFFERENTIAL (CANCER CENTER ONLY)
Abs Immature Granulocytes: 0.02 10*3/uL (ref 0.00–0.07)
Basophils Absolute: 0 10*3/uL (ref 0.0–0.1)
Basophils Relative: 1 %
Eosinophils Absolute: 0.2 10*3/uL (ref 0.0–0.5)
Eosinophils Relative: 5 %
HCT: 30.6 % — ABNORMAL LOW (ref 39.0–52.0)
Hemoglobin: 9.4 g/dL — ABNORMAL LOW (ref 13.0–17.0)
Immature Granulocytes: 0 %
Lymphocytes Relative: 21 %
Lymphs Abs: 1 10*3/uL (ref 0.7–4.0)
MCH: 27.2 pg (ref 26.0–34.0)
MCHC: 30.7 g/dL (ref 30.0–36.0)
MCV: 88.7 fL (ref 80.0–100.0)
Monocytes Absolute: 0.6 10*3/uL (ref 0.1–1.0)
Monocytes Relative: 12 %
Neutro Abs: 2.9 10*3/uL (ref 1.7–7.7)
Neutrophils Relative %: 61 %
Platelet Count: 159 10*3/uL (ref 150–400)
RBC: 3.45 MIL/uL — ABNORMAL LOW (ref 4.22–5.81)
RDW: 18.2 % — ABNORMAL HIGH (ref 11.5–15.5)
WBC Count: 4.8 10*3/uL (ref 4.0–10.5)
nRBC: 0 % (ref 0.0–0.2)

## 2021-08-19 LAB — CMP (CANCER CENTER ONLY)
ALT: 9 U/L (ref 0–44)
AST: 8 U/L — ABNORMAL LOW (ref 15–41)
Albumin: 4 g/dL (ref 3.5–5.0)
Alkaline Phosphatase: 99 U/L (ref 38–126)
Anion gap: 9 (ref 5–15)
BUN: 31 mg/dL — ABNORMAL HIGH (ref 8–23)
CO2: 22 mmol/L (ref 22–32)
Calcium: 8.9 mg/dL (ref 8.9–10.3)
Chloride: 110 mmol/L (ref 98–111)
Creatinine: 2.21 mg/dL — ABNORMAL HIGH (ref 0.61–1.24)
GFR, Estimated: 30 mL/min — ABNORMAL LOW (ref >60)
Glucose, Bld: 174 mg/dL — ABNORMAL HIGH (ref 70–99)
Potassium: 4 mmol/L (ref 3.5–5.1)
Sodium: 141 mmol/L (ref 135–145)
Total Bilirubin: 0.3 mg/dL (ref 0.3–1.2)
Total Protein: 6.7 g/dL (ref 6.5–8.1)

## 2021-08-19 MED ORDER — SODIUM CHLORIDE 0.9 % IV SOLN: Freq: Once | INTRAVENOUS | Status: AC

## 2021-08-19 MED ORDER — ATROPINE SULFATE 1 MG/ML IV SOLN
0.5000 mg | Freq: Once | INTRAVENOUS | Status: AC | PRN
  Administered 2021-08-19: 0.5 mg via INTRAVENOUS
  Filled 2021-08-19: qty 1

## 2021-08-19 MED ORDER — PALONOSETRON HCL INJECTION 0.25 MG/5ML
0.2500 mg | Freq: Once | INTRAVENOUS | Status: AC
  Administered 2021-08-19: 0.25 mg via INTRAVENOUS
  Filled 2021-08-19: qty 5

## 2021-08-19 MED ORDER — SODIUM CHLORIDE 0.9 % IV SOLN
10.0000 mg | Freq: Once | INTRAVENOUS | Status: AC
  Administered 2021-08-19: 10 mg via INTRAVENOUS
  Filled 2021-08-19: qty 1

## 2021-08-19 MED ORDER — SODIUM CHLORIDE 0.9 % IV SOLN
120.0000 mg/m2 | Freq: Once | INTRAVENOUS | Status: AC
  Administered 2021-08-19: 300 mg via INTRAVENOUS
  Filled 2021-08-19: qty 15

## 2021-08-19 MED ORDER — SODIUM CHLORIDE 0.9 % IV SOLN
1800.0000 mg/m2 | INTRAVENOUS | Status: DC
  Administered 2021-08-19: 4350 mg via INTRAVENOUS
  Filled 2021-08-19: qty 87

## 2021-08-19 MED ORDER — FLUOROURACIL CHEMO INJECTION 2.5 GM/50ML
300.0000 mg/m2 | Freq: Once | INTRAVENOUS | Status: AC
  Administered 2021-08-19: 750 mg via INTRAVENOUS
  Filled 2021-08-19: qty 15

## 2021-08-19 MED ORDER — SODIUM CHLORIDE 0.9 % IV SOLN
300.0000 mg/m2 | Freq: Once | INTRAVENOUS | Status: AC
  Administered 2021-08-19: 726 mg via INTRAVENOUS
  Filled 2021-08-19: qty 36.3

## 2021-08-19 MED ORDER — SODIUM CHLORIDE 0.9 % IV SOLN
150.0000 mg | Freq: Once | INTRAVENOUS | Status: AC
  Administered 2021-08-19: 150 mg via INTRAVENOUS
  Filled 2021-08-19: qty 5

## 2021-08-19 NOTE — Progress Notes (Signed)
Patient presents for treatment. RN assessment completed along with the following: ? ?Labs/vitals reviewed - Yes, and within treatment parameters.   ?Weight within 10% of previous measurement - Yes ?Informed consent completed and reflects current therapy/intent - Yes, on date 02/27/2021             ?Provider progress note reviewed - Today's provider note is not yet available. I reviewed the most recent oncology provider progress note in chart dated 07/24/2021. ?Treatment/Antibody/Supportive plan reviewed - Yes, and there are no adjustments needed for today's treatment. ?S&H and other orders reviewed - Yes, and there are no additional orders identified. ?Previous treatment date reviewed - Yes, and the appropriate amount of time has elapsed between treatments. ?Clinic Hand Off Received from - Cristy Friedlander, RN ? ?Patient to proceed with treatment.  ? ?

## 2021-08-19 NOTE — Progress Notes (Signed)
Patient seen by Dr. Benay Spice today ? ?Vitals are within treatment parameters. ? ?Labs reviewed by Dr. Benay Spice and are not all within treatment parameters. Serum creatinine 2.21 --OK to treat (improved) ? ?Per physician team, patient is ready for treatment. Please note that modifications are being made to the treatment plan including Avastin on hold today.   ?

## 2021-08-19 NOTE — Progress Notes (Signed)
?[facility] ?OFFICE PROGRESS NOTE ? ? ?Diagnosis: Colon cancer ? ?INTERVAL HISTORY:  ? ?Louis [name] completed another cycle of FOLFIRI on 07/24/2021.  He reports malaise following chemotherapy.  He had mouth soreness.  This has resolved.  No change in the palpable lymph nodes. ? ?Objective: ? ?Vital signs in last 24 hours: ? ?Blood pressure (!) 149/71, pulse 75, temperature 98.2 ?F (36.8 ?C), temperature source Oral, resp. rate 18, height '6\' 4"'  (1.93 m), weight 241 lb (109.3 kg), SpO2 100 %. ?  ? ?HEENT: No thrush or ulcers ?Lymphatics: 1.5 cm mobile left axillary node, 4-5 cm firm left inguinal node ?Resp: Lungs clear bilaterally ?Cardio: Regular rate and rhythm ?GI: No hepatosplenomegaly ?Vascular: No leg edema ? ?Portacath/PICC-without erythema ? ?Lab Results: ? ?Lab Results  ?Component Value Date  ? WBC 4.8 08/19/2021  ? HGB 9.4 (L) 08/19/2021  ? HCT 30.6 (L) 08/19/2021  ? MCV 88.7 08/19/2021  ? PLT 159 08/19/2021  ? NEUTROABS 2.9 08/19/2021  ? ? ?CMP  ?Lab Results  ?Component Value Date  ? NA 141 08/19/2021  ? K 4.0 08/19/2021  ? CL 110 08/19/2021  ? CO2 22 08/19/2021  ? GLUCOSE 174 (H) 08/19/2021  ? BUN 31 (H) 08/19/2021  ? CREATININE 2.21 (H) 08/19/2021  ? CALCIUM 8.9 08/19/2021  ? PROT 6.7 08/19/2021  ? ALBUMIN 4.0 08/19/2021  ? AST 8 (L) 08/19/2021  ? ALT 9 08/19/2021  ? ALKPHOS 99 08/19/2021  ? BILITOT 0.3 08/19/2021  ? GFRNONAA 30 (L) 08/19/2021  ? GFRAA 28 (L) 09/20/2019  ? ? ?Lab Results  ?Component Value Date  ? CEA1 2.61 01/02/2021  ? CEA 3.76 01/02/2021  ? ? ? ? ?Medications: I have reviewed the patient's current medications. ? ? ?Assessment/Plan: ?Sigmoid colon cancer, stage IV (C1E,X5T,Z0Y), isolated mesenteric implant-resected, multiple tumor deposits ?Sigmoid/descending resection and creation of a descending colostomy 04/10/2016 ?MSI-stable, no loss of mismatch repair protein expression ?Foundation 1- BRAF V600E positive, MS-stable, intermediate tumor mutation burden, no RAS  mutation ?CT abdomen/pelvis 04/02/2016-no evidence of distant metastatic disease ?Cycle 1 FOLFOX 05/21/2016 ?Cycle 2 FOLFOX 06/04/2016 ?Cycle 3 FOLFOX 06/18/2016 ?Cycle 4 FOLFOX 07/02/2016 (oxaliplatin held secondary to thrombocytopenia) ?Cycle 5 FOLFOX 07/16/2016 ?Cycle 6 FOLFOX 07/30/2016 ?Cycle 7 FOLFOX 08/13/2016 (oxaliplatin held and 5-FU dose reduced) ?Cycle 8 FOLFOX 09/03/2016 (oxaliplatin held secondary to neuropathy) ?Cycle 9 FOLFOX 09/17/2016 (oxaliplatin held secondary to neuropathy) ?Cycle 10 FOLFOX 10/02/2016 ?Cycle 11 FOLFOX 10/15/2016 (oxaliplatin eliminated from the regimen) ?Cycle 12 FOLFOX 10/30/2016 (oxaliplatin eliminated) ?CTs 05/12/2017-no evidence of recurrent disease, right inguinal hernia containing bladder ?Colonoscopy 07/21/2017, 3 polyps were removed from the descending and transverse colon, fragments of tubular and tubulovillous adenoma ?CTs 05/19/2018-no evidence of recurrent disease, status post hernia repair, right upper lobe pneumonia ?CTs 05/20/2019-resolution of right upper lobe pneumonia, no evidence of metastatic disease ?Colonoscopy 01/25/2020-multiple polyps removed-tubular adenomas, poor preparation, repeat colonoscopy recommended ?CTs neck, chest, and abdomen/pelvis 08/27/2020- new 2 centimeter left axillary node, new 1.9 cm left inguinal node chronic mildly prominent portacaval node ?Ultrasound biopsy of left inguinal node on 10/29/2020-metastatic adenocarcinoma consistent with colon adenocarcinoma ?PET 1/74/9449-[id number] hypermetabolic left inguinal and left axillary nodes, solitary focus of hypermetabolic activity in the right lower quadrant small bowel ?Cycle 1 FOLFOX 12/19/2020 ?Cycle 2 FOLFOX 01/02/2021, oxaliplatin dose reduced secondary to neutropenia and thrombocytopenia ?Cycle 3 FOLFOX 01/16/2021 ?Cycle 4 FOLFOX 01/30/2021 ?CTs 02/11/2021-no change in left inguinal and left axillary nodes, no evidence of disease progression, stable wall thickening involving a loop of distal  ileum ?Cycle 1 FOLFIRI/Avastin 02/27/2021 ?Cycle 2 FOLFIRI/Avastin 03/13/2021 ?Cycle 3 FOLFIRI/Avastin 04/03/2021 ?Cycle 4 FOLFIRI 04/17/2021, Avastin held due to elevated urine protein ?05/07/2021-24-hour urine protein elevated 1562 ?Cycle 5 FOLFIRI 05/08/2021, Avastin held due to elevated urine protein, irinotecan dose reduced due to in general not feeling well after treatment ?CTs 05/17/2021-left axillary and left inguinal lymph nodes are smaller, no evidence of disease progression ?Cycle 6 FOLFIRI 05/22/2021, Avastin held due to proteinuria ?06/03/2021 24-hour urine with 1.9 g of protein ?Cycle 7 FOLFIRI 06/12/2021, Avastin held due to proteinuria ?Cycle 8 FOLFIRI 07/03/2021, Avastin held due to proteinuria ?Cycle 9 FOLFIRI 07/24/2021, Avastin held due to proteinuria ?Cycle 10 FOLFIRI 08/19/2021, Avastin held due to proteinuria ?  ?2.   Chronic renal insufficiency ?  ?3.   Hypertension ?  ?4.  Inflamed sebaceous cyst at the upper back-status post incision and drainage ?  ?5.  Thrombocytopenia secondary chemotherapy-oxaliplatin dose reduced beginning with cycle 3 FOLFOX, oxaliplatin held with cycle 4 and cycle 7 FOLFOX ?  ?6.  Oxaliplatin neuropathy-improved ?  ?7.  History of Mucositis secondary chemotherapy ?  ?8.  Symptoms of pneumonia January 2020-CT 05/19/2018 consistent with right upper lobe pneumonia, Levaquin prescribed 05/20/2018 ?  ?9.  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  ?10.  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  ? ? ?Disposition: ?Louis Kruse appears unchanged.  He will complete another cycle of FOLFIRI today.  He will undergo restaging CT evaluation after this cycle.  He will return for an office visit 09/04/2021.  Avastin remains on hold secondary to persistent proteinuria (1820 mg on 08/04/2021) ? ?Betsy Coder, MD ? ?08/19/2021  ?10:01 AM ? ? ?

## 2021-08-19 NOTE — Patient Instructions (Signed)

## 2021-08-19 NOTE — Patient Instructions (Signed)
[facility]  Discharge Instructions: ?Thank you for choosing [facility] to provide your oncology and hematology care.  ? ?If you have a lab appointment with the [facility], please go directly to the [facility] and check in at the registration area. ?  ?Wear comfortable clothing and clothing appropriate for easy access to any Portacath or PICC line.  ? ?We strive to give you quality time with your provider. You may need to reschedule your appointment if you arrive late (15 or more minutes).  Arriving late affects you and other patients whose appointments are after yours.  Also, if you miss three or more appointments without notifying the office, you may be dismissed from the clinic at the provider?s discretion.    ?  ?For prescription refill requests, have your pharmacy contact our office and allow 72 hours for refills to be completed.   ? ?Today you received the following chemotherapy and/or immunotherapy agents Irinotecan, Leucovorin and Adrucil    ?  ?To help prevent nausea and vomiting after your treatment, we encourage you to take your nausea medication as directed. ? ?BELOW ARE SYMPTOMS THAT SHOULD BE REPORTED IMMEDIATELY: ?*FEVER GREATER THAN 100.4 F (38 ?C) OR HIGHER ?*CHILLS OR SWEATING ?*NAUSEA AND VOMITING THAT IS NOT CONTROLLED WITH YOUR NAUSEA MEDICATION ?*UNUSUAL SHORTNESS OF BREATH ?*UNUSUAL BRUISING OR BLEEDING ?*URINARY PROBLEMS (pain or burning when urinating, or frequent urination) ?*BOWEL PROBLEMS (unusual diarrhea, constipation, pain near the anus) ?TENDERNESS IN MOUTH AND THROAT WITH OR WITHOUT PRESENCE OF ULCERS (sore throat, sores in mouth, or a toothache) ?UNUSUAL RASH, SWELLING OR PAIN  ?UNUSUAL VAGINAL DISCHARGE OR ITCHING  ? ?Items with * indicate a potential emergency and should be followed up as soon as possible or go to the Emergency Department if any problems should occur. ? ?Please show the CHEMOTHERAPY ALERT CARD or IMMUNOTHERAPY ALERT  CARD at check-in to the Emergency Department and triage nurse. ? ?Should you have questions after your visit or need to cancel or reschedule your appointment, please contact [facility]  Dept: [phone]  and follow the prompts.  Office hours are 8:00 a.m. to 4:30 p.m. Monday - Friday. Please note that voicemails left after 4:00 p.m. may not be returned until the following business day.  We are closed weekends and major holidays. You have access to a nurse at all times for urgent questions. Please call the main number to the clinic Dept: [phone] and follow the prompts. ? ? ?For any non-urgent questions, you may also contact your provider using MyChart. We now offer e-Visits for anyone 66 and older to request care online for non-urgent symptoms. For details visit [email or web address]. ?  ?Also download the MyChart app! Go to the app store, search "MyChart", open the app, select [facility], and log in with your MyChart username and password. ? ?Due to Covid, a mask is required upon entering the hospital/clinic. If you do not have a mask, one will be given to you upon arrival. For doctor visits, patients may have 1 support person aged 35 or older with them. For treatment visits, patients cannot have anyone with them due to current Covid guidelines and our immunocompromised population.  ? ?

## 2021-08-21 ENCOUNTER — Inpatient Hospital Stay: Payer: Medicare Other

## 2021-08-21 VITALS — BP 141/74 | HR 61 | Temp 98.0°F | Resp 18

## 2021-08-21 DIAGNOSIS — Z5111 Encounter for antineoplastic chemotherapy: Secondary | ICD-10-CM | POA: Diagnosis not present

## 2021-08-21 DIAGNOSIS — Z79899 Other long term (current) drug therapy: Secondary | ICD-10-CM | POA: Diagnosis not present

## 2021-08-21 DIAGNOSIS — N289 Disorder of kidney and ureter, unspecified: Secondary | ICD-10-CM | POA: Diagnosis not present

## 2021-08-21 DIAGNOSIS — D696 Thrombocytopenia, unspecified: Secondary | ICD-10-CM | POA: Diagnosis not present

## 2021-08-21 DIAGNOSIS — C187 Malignant neoplasm of sigmoid colon: Secondary | ICD-10-CM | POA: Diagnosis not present

## 2021-08-21 DIAGNOSIS — C189 Malignant neoplasm of colon, unspecified: Secondary | ICD-10-CM

## 2021-08-21 DIAGNOSIS — I10 Essential (primary) hypertension: Secondary | ICD-10-CM | POA: Diagnosis not present

## 2021-08-21 MED ORDER — HEPARIN SOD (PORK) LOCK FLUSH 100 UNIT/ML IV SOLN
500.0000 [IU] | Freq: Once | INTRAVENOUS | Status: AC | PRN
  Administered 2021-08-21: 500 [IU]

## 2021-08-21 MED ORDER — SODIUM CHLORIDE 0.9% FLUSH
10.0000 mL | INTRAVENOUS | Status: DC | PRN
  Administered 2021-08-21: 10 mL

## 2021-08-21 NOTE — Patient Instructions (Signed)
Heparin injection ?What is this medication? ?HEPARIN (HEP a rin) is an anticoagulant. It is used to treat or prevent clots in the veins, arteries, lungs, or heart. It stops clots from forming or getting bigger. This medicine prevents clotting during open-heart surgery, dialysis, or in patients who are confined to bed. ?This medicine may be used for other purposes; ask your health care provider or pharmacist if you have questions. ?COMMON BRAND NAME(S): Hep-Lock, Hep-Lock U/P, Hepflush-10, Monoject Prefill Advanced Heparin Lock Flush, SASH Normal Saline and Heparin ?What should I tell my care team before I take this medication? ?They need to know if you have any of these conditions: ?bleeding disorders, such as hemophilia or low blood platelets ?bowel disease or diverticulitis ?endocarditis ?high blood pressure ?liver disease ?recent surgery or delivery of a baby ?stomach ulcers ?an unusual or allergic reaction to heparin, benzyl alcohol, sulfites, other medicines, foods, dyes, or preservatives ?pregnant or trying to get pregnant ?breast-feeding ?How should I use this medication? ?This medicine is given by injection or infusion into a vein. It can also be given by injection of small amounts under the skin. It is usually given by a health care professional in a hospital or clinic setting. ?If you get this medicine at home, you will be taught how to prepare and give this medicine. Use exactly as directed. Take your medicine at regular intervals. Do not take it more often than directed. Do not stop taking except on your doctor's advice. Stopping this medicine may increase your risk of a blot clot. Be sure to refill your prescription before you run out of medicine. ?It is important that you put your used needles and syringes in a special sharps container. Do not put them in a trash can. If you do not have a sharps container, call your pharmacist or healthcare provider to get one. ?Talk to your pediatrician regarding the  use of this medicine in children. While this medicine may be prescribed for children for selected conditions, precautions do apply. ?Overdosage: If you think you have taken too much of this medicine contact a poison control center or emergency room at once. ?NOTE: This medicine is only for you. Do not share this medicine with others. ?What if I miss a dose? ?If you miss a dose, take it as soon as you can. If it is almost time for your next dose, take only that dose. Do not take double or extra doses. ?What may interact with this medication? ?Do not take this medicine with any of the following medications: ?aspirin and aspirin-like drugs ?mifepristone ?medicines that treat or prevent blood clots like warfarin, enoxaparin, and dalteparin ?palifermin ?protamine ?This medicine may also interact with the following medications: ?dextran ?digoxin ?hydroxychloroquine ?medicines for treating colds or allergies ?nicotine ?NSAIDs, medicines for pain and inflammation, like ibuprofen or naproxen ?phenylbutazone ?tetracycline antibiotics ?This list may not describe all possible interactions. Give your health care provider a list of all the medicines, herbs, non-prescription drugs, or dietary supplements you use. Also tell them if you smoke, drink alcohol, or use illegal drugs. Some items may interact with your medicine. ?What should I watch for while using this medication? ?Visit your healthcare professional for regular checks on your progress. You may need blood work done while you are taking this medicine. Your condition will be monitored carefully while you are receiving this medicine. It is important not to miss any appointments. ?Wear a medical ID bracelet or chain, and carry a card that describes your disease and details   of your medicine and dosage times. ?Notify your doctor or healthcare professional at once if you have cold, blue hands or feet. ?If you are going to need surgery or other procedure, tell your healthcare  professional that you are using this medicine. ?Avoid sports and activities that might cause injury while you are using this medicine. Severe falls or injuries can cause unseen bleeding. Be careful when using sharp tools or knives. Consider using an electric razor. Take special care brushing or flossing your teeth. Report any injuries, bruising, or red spots on the skin to your healthcare professional. ?Using this medicine for a long time may weaken your bones and increase the risk of bone fractures. ?You should make sure that you get enough calcium and vitamin D while you are taking this medicine. Discuss the foods you eat and the vitamins you take with your healthcare professional. ?Wear a medical ID bracelet or chain. Carry a card that describes your disease and details of your medicine and dosage times. ?What side effects may I notice from receiving this medication? ?Side effects that you should report to your doctor or health care professional as soon as possible: ?allergic reactions like skin rash, itching or hives, swelling of the face, lips, or tongue ?bone pain ?fever, chills ?nausea, vomiting ?signs and symptoms of bleeding such as bloody or black, tarry stools; red or dark-brown urine; spitting up blood or brown material that looks like coffee grounds; red spots on the skin; unusual bruising or bleeding from the eye, gums, or nose ?signs and symptoms of a blood clot such as chest pain; shortness of breath; pain, swelling, or warmth in the leg ?signs and symptoms of a stroke such as changes in vision; confusion; trouble speaking or understanding; severe headaches; sudden numbness or weakness of the face, arm or leg; trouble walking; dizziness; loss of coordination ?Side effects that usually do not require medical attention (report to your doctor or health care professional if they continue or are bothersome): ?hair loss ?pain, redness, or irritation at site where injected ?This list may not describe all  possible side effects. Call your doctor for medical advice about side effects. You may report side effects to FDA at 1-800-FDA-1088. ?Where should I keep my medication? ?Keep out of the reach of children. ?Store unopened vials at room temperature between 15 and 30 degrees C (59 and 86 degrees F). Do not freeze. Do not use if solution is discolored or particulate matter is present. Throw away any unused medicine after the expiration date. ?NOTE: This sheet is a summary. It may not cover all possible information. If you have questions about this medicine, talk to your doctor, pharmacist, or health care provider. ?? 2023 Elsevier/Gold Standard (2020-05-31 00:00:00) ? ?

## 2021-09-02 ENCOUNTER — Encounter (HOSPITAL_BASED_OUTPATIENT_CLINIC_OR_DEPARTMENT_OTHER): Payer: Self-pay

## 2021-09-02 ENCOUNTER — Ambulatory Visit (HOSPITAL_BASED_OUTPATIENT_CLINIC_OR_DEPARTMENT_OTHER)
Admission: RE | Admit: 2021-09-02 | Discharge: 2021-09-02 | Disposition: A | Discharge status: Home / Self Care | Payer: Medicare Other | Source: Ambulatory Visit | Attending: Oncology | Admitting: Oncology

## 2021-09-02 DIAGNOSIS — N281 Cyst of kidney, acquired: Secondary | ICD-10-CM | POA: Diagnosis not present

## 2021-09-02 DIAGNOSIS — C189 Malignant neoplasm of colon, unspecified: Secondary | ICD-10-CM | POA: Insufficient documentation

## 2021-09-02 DIAGNOSIS — R918 Other nonspecific abnormal finding of lung field: Secondary | ICD-10-CM | POA: Diagnosis not present

## 2021-09-02 DIAGNOSIS — N400 Enlarged prostate without lower urinary tract symptoms: Secondary | ICD-10-CM | POA: Diagnosis not present

## 2021-09-04 ENCOUNTER — Inpatient Hospital Stay: Payer: Medicare Other

## 2021-09-04 ENCOUNTER — Inpatient Hospital Stay: Payer: Medicare Other | Admitting: Oncology

## 2021-09-06 ENCOUNTER — Inpatient Hospital Stay: Payer: Medicare Other

## 2021-09-10 ENCOUNTER — Inpatient Hospital Stay: Payer: Medicare Other

## 2021-09-10 ENCOUNTER — Encounter: Payer: Self-pay | Admitting: *Deleted

## 2021-09-10 ENCOUNTER — Inpatient Hospital Stay: Payer: Medicare Other | Attending: Oncology | Admitting: Oncology

## 2021-09-10 ENCOUNTER — Ambulatory Visit: Payer: Medicare Other

## 2021-09-10 VITALS — BP 141/67 | HR 73 | Temp 98.1°F | Resp 18 | Ht 76.0 in | Wt 239.6 lb

## 2021-09-10 DIAGNOSIS — C189 Malignant neoplasm of colon, unspecified: Secondary | ICD-10-CM

## 2021-09-10 DIAGNOSIS — I129 Hypertensive chronic kidney disease with stage 1 through stage 4 chronic kidney disease, or unspecified chronic kidney disease: Secondary | ICD-10-CM | POA: Diagnosis not present

## 2021-09-10 DIAGNOSIS — N189 Chronic kidney disease, unspecified: Secondary | ICD-10-CM | POA: Diagnosis not present

## 2021-09-10 DIAGNOSIS — Z5111 Encounter for antineoplastic chemotherapy: Secondary | ICD-10-CM | POA: Insufficient documentation

## 2021-09-10 DIAGNOSIS — C774 Secondary and unspecified malignant neoplasm of inguinal and lower limb lymph nodes: Secondary | ICD-10-CM | POA: Diagnosis not present

## 2021-09-10 DIAGNOSIS — G620 Drug-induced polyneuropathy: Secondary | ICD-10-CM | POA: Insufficient documentation

## 2021-09-10 DIAGNOSIS — C187 Malignant neoplasm of sigmoid colon: Secondary | ICD-10-CM | POA: Insufficient documentation

## 2021-09-10 DIAGNOSIS — Z452 Encounter for adjustment and management of vascular access device: Secondary | ICD-10-CM | POA: Diagnosis not present

## 2021-09-10 NOTE — Progress Notes (Signed)
?[facility] ?OFFICE PROGRESS NOTE ? ? ?Diagnosis: Colon cancer ? ?INTERVAL HISTORY:  ? ?Louis [name] completed on cycle FOLFIRI on 08/19/2021.  He reports nausea and vomiting and diarrhea beginning several days following chemotherapy.  Zofran helped the nausea.  He had malaise following chemotherapy.  No new complaint.  He is working. ? ?Objective: ? ?Vital signs in last 24 hours: ? ?Blood pressure (!) 141/67, pulse 73, temperature 98.1 ?F (36.7 ?C), temperature source Oral, resp. rate 18, height '6\' 4"'  (1.93 m), weight 239 lb 9.6 oz (108.7 kg), SpO2 100 %. ?  ? ?HEENT: No thrush or ulcers ?Lymphatics: 2 cm mobile left axillary node, 1.5 cm mobile subcutaneous nodule (node?)  At the anterior lower right axillary line, 3-4 cm firm left inguinal node with overlying cutaneous nodularity ?Resp: Lungs clear bilaterally ?Cardio: Regular rate and rhythm ?GI: No hepatosplenomegaly ?Vascular: No leg edema ? ? ?Portacath/PICC-without erythema ? ?Lab Results: ? ?Lab Results  ?Component Value Date  ? WBC 4.8 08/19/2021  ? HGB 9.4 (L) 08/19/2021  ? HCT 30.6 (L) 08/19/2021  ? MCV 88.7 08/19/2021  ? PLT 159 08/19/2021  ? NEUTROABS 2.9 08/19/2021  ? ? ?CMP  ?Lab Results  ?Component Value Date  ? NA 141 08/19/2021  ? K 4.0 08/19/2021  ? CL 110 08/19/2021  ? CO2 22 08/19/2021  ? GLUCOSE 174 (H) 08/19/2021  ? BUN 31 (H) 08/19/2021  ? CREATININE 2.21 (H) 08/19/2021  ? CALCIUM 8.9 08/19/2021  ? PROT 6.7 08/19/2021  ? ALBUMIN 4.0 08/19/2021  ? AST 8 (L) 08/19/2021  ? ALT 9 08/19/2021  ? ALKPHOS 99 08/19/2021  ? BILITOT 0.3 08/19/2021  ? GFRNONAA 30 (L) 08/19/2021  ? GFRAA 28 (L) 09/20/2019  ? ? ?Lab Results  ?Component Value Date  ? CEA1 2.61 01/02/2021  ? CEA 3.76 01/02/2021  ? ? ? ?Medications: I have reviewed the patient's current medications. ? ? ?Assessment/Plan: ? 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), isolated mesenteric implant-resected, multiple tumor deposits ?Sigmoid/descending resection and creation of a  descending colostomy 04/10/2016 ?MSI-stable, no loss of mismatch repair protein expression ?Foundation 1- BRAF V600E positive, MS-stable, intermediate tumor mutation burden, no RAS mutation ?CT abdomen/pelvis 04/02/2016-no evidence of distant metastatic disease ?Cycle 1 FOLFOX 05/21/2016 ?Cycle 2 FOLFOX 06/04/2016 ?Cycle 3 FOLFOX 06/18/2016 ?Cycle 4 FOLFOX 07/02/2016 (oxaliplatin held secondary to thrombocytopenia) ?Cycle 5 FOLFOX 07/16/2016 ?Cycle 6 FOLFOX 07/30/2016 ?Cycle 7 FOLFOX 08/13/2016 (oxaliplatin held and 5-FU dose reduced) ?Cycle 8 FOLFOX 09/03/2016 (oxaliplatin held secondary to neuropathy) ?Cycle 9 FOLFOX 09/17/2016 (oxaliplatin held secondary to neuropathy) ?Cycle 10 FOLFOX 10/02/2016 ?Cycle 11 FOLFOX 10/15/2016 (oxaliplatin eliminated from the regimen) ?Cycle 12 FOLFOX 10/30/2016 (oxaliplatin eliminated) ?CTs 05/12/2017-no evidence of recurrent disease, right inguinal hernia containing bladder ?Colonoscopy 07/21/2017, 3 polyps were removed from the descending and transverse colon, fragments of tubular and tubulovillous adenoma ?CTs 05/19/2018-no evidence of recurrent disease, status post hernia repair, right upper lobe pneumonia ?CTs 05/20/2019-resolution of right upper lobe pneumonia, no evidence of metastatic disease ?Colonoscopy 01/25/2020-multiple polyps removed-tubular adenomas, poor preparation, repeat colonoscopy recommended ?CTs neck, chest, and abdomen/pelvis 08/27/2020- new 2 centimeter left axillary node, new 1.9 cm left inguinal node chronic mildly prominent portacaval node ?Ultrasound biopsy of left inguinal node on 10/29/2020-metastatic adenocarcinoma consistent with colon adenocarcinoma ?PET 1/96/2229-NLGXQJJH hypermetabolic left inguinal and left axillary nodes, solitary focus of hypermetabolic activity in the right lower quadrant small bowel ?Cycle 1 FOLFOX 12/19/2020 ?Cycle 2 FOLFOX 01/02/2021, oxaliplatin dose reduced secondary to neutropenia and thrombocytopenia ?Cycle 3 FOLFOX  01/16/2021 ?Cycle 4 FOLFOX 01/30/2021 ?CTs 02/11/2021-no change in left inguinal and left axillary nodes, no evidence of disease progression, stable wall thickening involving a loop of distal ileum ?Cycle 1 FOLFIRI/Avastin 02/27/2021 ?Cycle 2 FOLFIRI/Avastin 03/13/2021 ?Cycle 3 FOLFIRI/Avastin 04/03/2021 ?Cycle 4 FOLFIRI 04/17/2021, Avastin held due to elevated urine protein ?05/07/2021-24-hour urine protein elevated 1562 ?Cycle 5 FOLFIRI 05/08/2021, Avastin held due to elevated urine protein, irinotecan dose reduced due to in general not feeling well after treatment ?CTs 05/17/2021-left axillary and left inguinal lymph nodes are smaller, no evidence of disease progression ?Cycle 6 FOLFIRI 05/22/2021, Avastin held due to proteinuria ?06/03/2021 24-hour urine with 1.9 g of protein ?Cycle 7 FOLFIRI 06/12/2021, Avastin held due to proteinuria ?Cycle 8 FOLFIRI 07/03/2021, Avastin held due to proteinuria ?Cycle 9 FOLFIRI 07/24/2021, Avastin held due to proteinuria ?Cycle 10 FOLFIRI 08/19/2021, Avastin held due to proteinuria ?CTs 09/02/2021-no change in the left axillary, left inguinal, and portacaval lymph nodes.  No mention of the distal small bowel thickening ?Maintenance 5-fluorouracil 09/18/2021 ?  ?2.   Chronic renal insufficiency ?  ?3.   Hypertension ?  ?4.  Inflamed sebaceous cyst at the upper back-status post incision and drainage ?  ?5.  Thrombocytopenia secondary chemotherapy-oxaliplatin dose reduced beginning with cycle 3 FOLFOX, oxaliplatin held with cycle 4 and cycle 7 FOLFOX ?  ?6.  Oxaliplatin neuropathy-improved ?  ?7.  History of Mucositis secondary chemotherapy ?  ?8.  Symptoms of pneumonia January 2020-CT 05/19/2018 consistent with right upper lobe pneumonia, Levaquin prescribed 05/20/2018 ?  ?9.  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  ?10.  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  ? ? ? ?Disposition: ?Louis [name] has been maintained on FOLFIRI based therapy since October 2022.  I reviewed the restaging CT  findings and images with him.  The overall pattern is consistent with stable disease.  The mildly enlarged portacaval node was not hypermetabolic on the PET in July 2022 and is likely benign finding.  There may be a new node in the right axilla. ?I will present his case at the GI tumor conference within the next few weeks to discuss the indication for resection of the left axillary and left inguinal lymph nodes. ? ?He will begin maintenance 5-fluorouracil 09/18/2021.  He will be scheduled for an office visit on 10/08/2021. ? ? ?Betsy Coder, MD ? ?09/10/2021  ?9:04 AM ? ? ?

## 2021-09-11 ENCOUNTER — Encounter: Payer: Self-pay | Admitting: Oncology

## 2021-09-12 ENCOUNTER — Inpatient Hospital Stay: Payer: Medicare Other

## 2021-09-14 ENCOUNTER — Other Ambulatory Visit: Payer: Self-pay | Admitting: Oncology

## 2021-09-18 ENCOUNTER — Inpatient Hospital Stay: Payer: Medicare Other

## 2021-09-18 ENCOUNTER — Other Ambulatory Visit: Payer: Self-pay

## 2021-09-18 VITALS — BP 114/59 | HR 66 | Temp 98.0°F | Resp 18 | Ht 76.0 in | Wt 240.2 lb

## 2021-09-18 DIAGNOSIS — C189 Malignant neoplasm of colon, unspecified: Secondary | ICD-10-CM

## 2021-09-18 DIAGNOSIS — Z5111 Encounter for antineoplastic chemotherapy: Secondary | ICD-10-CM | POA: Diagnosis not present

## 2021-09-18 DIAGNOSIS — I129 Hypertensive chronic kidney disease with stage 1 through stage 4 chronic kidney disease, or unspecified chronic kidney disease: Secondary | ICD-10-CM | POA: Diagnosis not present

## 2021-09-18 DIAGNOSIS — G620 Drug-induced polyneuropathy: Secondary | ICD-10-CM | POA: Diagnosis not present

## 2021-09-18 DIAGNOSIS — N189 Chronic kidney disease, unspecified: Secondary | ICD-10-CM | POA: Diagnosis not present

## 2021-09-18 DIAGNOSIS — C774 Secondary and unspecified malignant neoplasm of inguinal and lower limb lymph nodes: Secondary | ICD-10-CM | POA: Diagnosis not present

## 2021-09-18 DIAGNOSIS — C187 Malignant neoplasm of sigmoid colon: Secondary | ICD-10-CM | POA: Diagnosis not present

## 2021-09-18 LAB — CMP (CANCER CENTER ONLY)
ALT: 7 U/L (ref 0–44)
AST: 7 U/L — ABNORMAL LOW (ref 15–41)
Albumin: 3.8 g/dL (ref 3.5–5.0)
Alkaline Phosphatase: 87 U/L (ref 38–126)
Anion gap: 11 (ref 5–15)
BUN: 37 mg/dL — ABNORMAL HIGH (ref 8–23)
CO2: 22 mmol/L (ref 22–32)
Calcium: 8.8 mg/dL — ABNORMAL LOW (ref 8.9–10.3)
Chloride: 109 mmol/L (ref 98–111)
Creatinine: 2.5 mg/dL — ABNORMAL HIGH (ref 0.61–1.24)
GFR, Estimated: 26 mL/min — ABNORMAL LOW (ref >60)
Glucose, Bld: 186 mg/dL — ABNORMAL HIGH (ref 70–99)
Potassium: 4.3 mmol/L (ref 3.5–5.1)
Sodium: 142 mmol/L (ref 135–145)
Total Bilirubin: 0.3 mg/dL (ref 0.3–1.2)
Total Protein: 6.7 g/dL (ref 6.5–8.1)

## 2021-09-18 LAB — CBC WITH DIFFERENTIAL (CANCER CENTER ONLY)
Abs Immature Granulocytes: 0.03 10*3/uL (ref 0.00–0.07)
Basophils Absolute: 0 10*3/uL (ref 0.0–0.1)
Basophils Relative: 1 %
Eosinophils Absolute: 0.3 10*3/uL (ref 0.0–0.5)
Eosinophils Relative: 6 %
HCT: 29.1 % — ABNORMAL LOW (ref 39.0–52.0)
Hemoglobin: 8.9 g/dL — ABNORMAL LOW (ref 13.0–17.0)
Immature Granulocytes: 1 %
Lymphocytes Relative: 22 %
Lymphs Abs: 1.1 10*3/uL (ref 0.7–4.0)
MCH: 26.5 pg (ref 26.0–34.0)
MCHC: 30.6 g/dL (ref 30.0–36.0)
MCV: 86.6 fL (ref 80.0–100.0)
Monocytes Absolute: 0.4 10*3/uL (ref 0.1–1.0)
Monocytes Relative: 9 %
Neutro Abs: 2.9 10*3/uL (ref 1.7–7.7)
Neutrophils Relative %: 61 %
Platelet Count: 173 10*3/uL (ref 150–400)
RBC: 3.36 MIL/uL — ABNORMAL LOW (ref 4.22–5.81)
RDW: 17.1 % — ABNORMAL HIGH (ref 11.5–15.5)
WBC Count: 4.7 10*3/uL (ref 4.0–10.5)
nRBC: 0 % (ref 0.0–0.2)

## 2021-09-18 MED ORDER — SODIUM CHLORIDE 0.9 % IV SOLN: Freq: Once | INTRAVENOUS | Status: AC

## 2021-09-18 MED ORDER — SODIUM CHLORIDE 0.9 % IV SOLN
300.0000 mg/m2 | Freq: Once | INTRAVENOUS | Status: AC
  Administered 2021-09-18: 726 mg via INTRAVENOUS
  Filled 2021-09-18: qty 36.3

## 2021-09-18 MED ORDER — SODIUM CHLORIDE 0.9 % IV SOLN
1800.0000 mg/m2 | INTRAVENOUS | Status: DC
  Administered 2021-09-18: 4350 mg via INTRAVENOUS
  Filled 2021-09-18: qty 87

## 2021-09-18 MED ORDER — FLUOROURACIL CHEMO INJECTION 2.5 GM/50ML
300.0000 mg/m2 | Freq: Once | INTRAVENOUS | Status: AC
  Administered 2021-09-18: 750 mg via INTRAVENOUS
  Filled 2021-09-18: qty 15

## 2021-09-18 NOTE — Progress Notes (Signed)
Patient presents for treatment. RN assessment completed along with the following: ? ?Labs/vitals reviewed - Yes, and Per Dr. Benay Spice, ok to treat with Scr 2.50.     ?Weight within 10% of previous measurement - Yes ?Informed consent completed and reflects current therapy/intent - Yes, on date 02/27/21             ?Provider progress note reviewed - Patient not seen by provider today. Most recent note dated 09/10/21 reviewed. ?Treatment/Antibody/Supportive plan reviewed - Yes, and there are no adjustments needed for today's treatment. ?S&H and other orders reviewed - Yes, and there are no additional orders identified. ?Previous treatment date reviewed - Yes, and the appropriate amount of time has elapsed between treatments. ? ? ?Patient to proceed with treatment.   ?

## 2021-09-18 NOTE — Progress Notes (Signed)
The proposed treatment discussed in conference is for discussion purpose only and is not a binding recommendation.  The patients have not been physically examined, or presented with their treatment options.  Therefore, final treatment plans cannot be decided.  

## 2021-09-18 NOTE — Progress Notes (Signed)
Per Dr. Benay Spice: OK to treat today with serum creatinine 2.50 ?

## 2021-09-18 NOTE — Patient Instructions (Signed)
[facility]  Discharge Instructions: ?Thank you for choosing [facility] to provide your oncology and hematology care.  ? ?If you have a lab appointment with the [facility], please go directly to the [facility] and check in at the registration area. ?  ?Wear comfortable clothing and clothing appropriate for easy access to any Portacath or PICC line.  ? ?We strive to give you quality time with your provider. You may need to reschedule your appointment if you arrive late (15 or more minutes).  Arriving late affects you and other patients whose appointments are after yours.  Also, if you miss three or more appointments without notifying the office, you may be dismissed from the clinic at the provider?s discretion.    ?  ?For prescription refill requests, have your pharmacy contact our office and allow 72 hours for refills to be completed.   ? ?Today you received the following chemotherapy and/or immunotherapy agents: Leucovorin and Adrucil    ?  ?To help prevent nausea and vomiting after your treatment, we encourage you to take your nausea medication as directed. ? ?BELOW ARE SYMPTOMS THAT SHOULD BE REPORTED IMMEDIATELY: ?*FEVER GREATER THAN 100.4 F (38 ?C) OR HIGHER ?*CHILLS OR SWEATING ?*NAUSEA AND VOMITING THAT IS NOT CONTROLLED WITH YOUR NAUSEA MEDICATION ?*UNUSUAL SHORTNESS OF BREATH ?*UNUSUAL BRUISING OR BLEEDING ?*URINARY PROBLEMS (pain or burning when urinating, or frequent urination) ?*BOWEL PROBLEMS (unusual diarrhea, constipation, pain near the anus) ?TENDERNESS IN MOUTH AND THROAT WITH OR WITHOUT PRESENCE OF ULCERS (sore throat, sores in mouth, or a toothache) ?UNUSUAL RASH, SWELLING OR PAIN  ?UNUSUAL VAGINAL DISCHARGE OR ITCHING  ? ?Items with * indicate a potential emergency and should be followed up as soon as possible or go to the Emergency Department if any problems should occur. ? ?Please show the CHEMOTHERAPY ALERT CARD or IMMUNOTHERAPY ALERT CARD at  check-in to the Emergency Department and triage nurse. ? ?Should you have questions after your visit or need to cancel or reschedule your appointment, please contact [facility]  Dept: [phone]  and follow the prompts.  Office hours are 8:00 a.m. to 4:30 p.m. Monday - Friday. Please note that voicemails left after 4:00 p.m. may not be returned until the following business day.  We are closed weekends and major holidays. You have access to a nurse at all times for urgent questions. Please call the main number to the clinic Dept: [phone] and follow the prompts. ? ? ?For any non-urgent questions, you may also contact your provider using MyChart. We now offer e-Visits for anyone 54 and older to request care online for non-urgent symptoms. For details visit [email or web address]. ?  ?Also download the MyChart app! Go to the app store, search "MyChart", open the app, select [facility], and log in with your MyChart username and password. ? ?Due to Covid, a mask is required upon entering the hospital/clinic. If you do not have a mask, one will be given to you upon arrival. For doctor visits, patients may have 1 support person aged 70 or older with them. For treatment visits, patients cannot have anyone with them due to current Covid guidelines and our immunocompromised population.  ? ?

## 2021-09-18 NOTE — Patient Instructions (Signed)

## 2021-09-20 ENCOUNTER — Inpatient Hospital Stay: Payer: Medicare Other

## 2021-09-20 VITALS — BP 127/62 | HR 70 | Temp 98.1°F | Resp 20

## 2021-09-20 DIAGNOSIS — N189 Chronic kidney disease, unspecified: Secondary | ICD-10-CM | POA: Diagnosis not present

## 2021-09-20 DIAGNOSIS — C774 Secondary and unspecified malignant neoplasm of inguinal and lower limb lymph nodes: Secondary | ICD-10-CM | POA: Diagnosis not present

## 2021-09-20 DIAGNOSIS — C189 Malignant neoplasm of colon, unspecified: Secondary | ICD-10-CM

## 2021-09-20 DIAGNOSIS — C187 Malignant neoplasm of sigmoid colon: Secondary | ICD-10-CM | POA: Diagnosis not present

## 2021-09-20 DIAGNOSIS — G620 Drug-induced polyneuropathy: Secondary | ICD-10-CM | POA: Diagnosis not present

## 2021-09-20 DIAGNOSIS — I129 Hypertensive chronic kidney disease with stage 1 through stage 4 chronic kidney disease, or unspecified chronic kidney disease: Secondary | ICD-10-CM | POA: Diagnosis not present

## 2021-09-20 DIAGNOSIS — Z5111 Encounter for antineoplastic chemotherapy: Secondary | ICD-10-CM | POA: Diagnosis not present

## 2021-09-20 MED ORDER — SODIUM CHLORIDE 0.9% FLUSH
10.0000 mL | INTRAVENOUS | Status: DC | PRN
  Administered 2021-09-20: 10 mL

## 2021-09-20 MED ORDER — HEPARIN SOD (PORK) LOCK FLUSH 100 UNIT/ML IV SOLN
500.0000 [IU] | Freq: Once | INTRAVENOUS | Status: AC | PRN
  Administered 2021-09-20: 500 [IU]

## 2021-09-20 NOTE — Patient Instructions (Signed)
Heparin injection ?What is this medication? ?HEPARIN (HEP a rin) is an anticoagulant. It is used to treat or prevent clots in the veins, arteries, lungs, or heart. It stops clots from forming or getting bigger. This medicine prevents clotting during open-heart surgery, dialysis, or in patients who are confined to bed. ?This medicine may be used for other purposes; ask your health care provider or pharmacist if you have questions. ?COMMON BRAND NAME(S): Hep-Lock, Hep-Lock U/P, Hepflush-10, Monoject Prefill Advanced Heparin Lock Flush, SASH Normal Saline and Heparin ?What should I tell my care team before I take this medication? ?They need to know if you have any of these conditions: ?bleeding disorders, such as hemophilia or low blood platelets ?bowel disease or diverticulitis ?endocarditis ?high blood pressure ?liver disease ?recent surgery or delivery of a baby ?stomach ulcers ?an unusual or allergic reaction to heparin, benzyl alcohol, sulfites, other medicines, foods, dyes, or preservatives ?pregnant or trying to get pregnant ?breast-feeding ?How should I use this medication? ?This medicine is given by injection or infusion into a vein. It can also be given by injection of small amounts under the skin. It is usually given by a health care professional in a hospital or clinic setting. ?If you get this medicine at home, you will be taught how to prepare and give this medicine. Use exactly as directed. Take your medicine at regular intervals. Do not take it more often than directed. Do not stop taking except on your doctor's advice. Stopping this medicine may increase your risk of a blot clot. Be sure to refill your prescription before you run out of medicine. ?It is important that you put your used needles and syringes in a special sharps container. Do not put them in a trash can. If you do not have a sharps container, call your pharmacist or healthcare provider to get one. ?Talk to your pediatrician regarding the  use of this medicine in children. While this medicine may be prescribed for children for selected conditions, precautions do apply. ?Overdosage: If you think you have taken too much of this medicine contact a poison control center or emergency room at once. ?NOTE: This medicine is only for you. Do not share this medicine with others. ?What if I miss a dose? ?If you miss a dose, take it as soon as you can. If it is almost time for your next dose, take only that dose. Do not take double or extra doses. ?What may interact with this medication? ?Do not take this medicine with any of the following medications: ?aspirin and aspirin-like drugs ?mifepristone ?medicines that treat or prevent blood clots like warfarin, enoxaparin, and dalteparin ?palifermin ?protamine ?This medicine may also interact with the following medications: ?dextran ?digoxin ?hydroxychloroquine ?medicines for treating colds or allergies ?nicotine ?NSAIDs, medicines for pain and inflammation, like ibuprofen or naproxen ?phenylbutazone ?tetracycline antibiotics ?This list may not describe all possible interactions. Give your health care provider a list of all the medicines, herbs, non-prescription drugs, or dietary supplements you use. Also tell them if you smoke, drink alcohol, or use illegal drugs. Some items may interact with your medicine. ?What should I watch for while using this medication? ?Visit your healthcare professional for regular checks on your progress. You may need blood work done while you are taking this medicine. Your condition will be monitored carefully while you are receiving this medicine. It is important not to miss any appointments. ?Wear a medical ID bracelet or chain, and carry a card that describes your disease and details   of your medicine and dosage times. ?Notify your doctor or healthcare professional at once if you have cold, blue hands or feet. ?If you are going to need surgery or other procedure, tell your healthcare  professional that you are using this medicine. ?Avoid sports and activities that might cause injury while you are using this medicine. Severe falls or injuries can cause unseen bleeding. Be careful when using sharp tools or knives. Consider using an electric razor. Take special care brushing or flossing your teeth. Report any injuries, bruising, or red spots on the skin to your healthcare professional. ?Using this medicine for a long time may weaken your bones and increase the risk of bone fractures. ?You should make sure that you get enough calcium and vitamin D while you are taking this medicine. Discuss the foods you eat and the vitamins you take with your healthcare professional. ?Wear a medical ID bracelet or chain. Carry a card that describes your disease and details of your medicine and dosage times. ?What side effects may I notice from receiving this medication? ?Side effects that you should report to your doctor or health care professional as soon as possible: ?allergic reactions like skin rash, itching or hives, swelling of the face, lips, or tongue ?bone pain ?fever, chills ?nausea, vomiting ?signs and symptoms of bleeding such as bloody or black, tarry stools; red or dark-brown urine; spitting up blood or brown material that looks like coffee grounds; red spots on the skin; unusual bruising or bleeding from the eye, gums, or nose ?signs and symptoms of a blood clot such as chest pain; shortness of breath; pain, swelling, or warmth in the leg ?signs and symptoms of a stroke such as changes in vision; confusion; trouble speaking or understanding; severe headaches; sudden numbness or weakness of the face, arm or leg; trouble walking; dizziness; loss of coordination ?Side effects that usually do not require medical attention (report to your doctor or health care professional if they continue or are bothersome): ?hair loss ?pain, redness, or irritation at site where injected ?This list may not describe all  possible side effects. Call your doctor for medical advice about side effects. You may report side effects to FDA at 1-800-FDA-1088. ?Where should I keep my medication? ?Keep out of the reach of children. ?Store unopened vials at room temperature between 15 and 30 degrees C (59 and 86 degrees F). Do not freeze. Do not use if solution is discolored or particulate matter is present. Throw away any unused medicine after the expiration date. ?NOTE: This sheet is a summary. It may not cover all possible information. If you have questions about this medicine, talk to your doctor, pharmacist, or health care provider. ?? 2023 Elsevier/Gold Standard (2020-05-31 00:00:00) ? ?

## 2021-09-23 DIAGNOSIS — N184 Chronic kidney disease, stage 4 (severe): Secondary | ICD-10-CM | POA: Diagnosis not present

## 2021-09-23 DIAGNOSIS — N390 Urinary tract infection, site not specified: Secondary | ICD-10-CM | POA: Diagnosis not present

## 2021-10-02 DIAGNOSIS — N1832 Chronic kidney disease, stage 3b: Secondary | ICD-10-CM | POA: Diagnosis not present

## 2021-10-02 DIAGNOSIS — E785 Hyperlipidemia, unspecified: Secondary | ICD-10-CM | POA: Diagnosis not present

## 2021-10-02 DIAGNOSIS — N2581 Secondary hyperparathyroidism of renal origin: Secondary | ICD-10-CM | POA: Diagnosis not present

## 2021-10-02 DIAGNOSIS — I129 Hypertensive chronic kidney disease with stage 1 through stage 4 chronic kidney disease, or unspecified chronic kidney disease: Secondary | ICD-10-CM | POA: Diagnosis not present

## 2021-10-02 DIAGNOSIS — E669 Obesity, unspecified: Secondary | ICD-10-CM | POA: Diagnosis not present

## 2021-10-02 DIAGNOSIS — N179 Acute kidney failure, unspecified: Secondary | ICD-10-CM | POA: Diagnosis not present

## 2021-10-02 DIAGNOSIS — C187 Malignant neoplasm of sigmoid colon: Secondary | ICD-10-CM | POA: Diagnosis not present

## 2021-10-05 ENCOUNTER — Other Ambulatory Visit: Payer: Self-pay | Admitting: Oncology

## 2021-10-08 ENCOUNTER — Other Ambulatory Visit: Payer: Medicare Other

## 2021-10-08 ENCOUNTER — Other Ambulatory Visit: Payer: Self-pay | Admitting: *Deleted

## 2021-10-08 ENCOUNTER — Telehealth: Payer: Self-pay | Admitting: *Deleted

## 2021-10-08 ENCOUNTER — Inpatient Hospital Stay: Payer: Medicare Other

## 2021-10-08 ENCOUNTER — Inpatient Hospital Stay: Payer: Medicare Other | Attending: Oncology | Admitting: Oncology

## 2021-10-08 ENCOUNTER — Ambulatory Visit: Payer: Medicare Other

## 2021-10-08 VITALS — BP 122/57 | HR 73 | Temp 98.1°F | Resp 18 | Ht 76.0 in | Wt 240.0 lb

## 2021-10-08 DIAGNOSIS — I129 Hypertensive chronic kidney disease with stage 1 through stage 4 chronic kidney disease, or unspecified chronic kidney disease: Secondary | ICD-10-CM | POA: Diagnosis not present

## 2021-10-08 DIAGNOSIS — C187 Malignant neoplasm of sigmoid colon: Secondary | ICD-10-CM | POA: Insufficient documentation

## 2021-10-08 DIAGNOSIS — Z5111 Encounter for antineoplastic chemotherapy: Secondary | ICD-10-CM | POA: Diagnosis not present

## 2021-10-08 DIAGNOSIS — Z452 Encounter for adjustment and management of vascular access device: Secondary | ICD-10-CM | POA: Insufficient documentation

## 2021-10-08 DIAGNOSIS — C189 Malignant neoplasm of colon, unspecified: Secondary | ICD-10-CM

## 2021-10-08 DIAGNOSIS — G620 Drug-induced polyneuropathy: Secondary | ICD-10-CM | POA: Diagnosis not present

## 2021-10-08 DIAGNOSIS — D6959 Other secondary thrombocytopenia: Secondary | ICD-10-CM | POA: Insufficient documentation

## 2021-10-08 DIAGNOSIS — C774 Secondary and unspecified malignant neoplasm of inguinal and lower limb lymph nodes: Secondary | ICD-10-CM | POA: Insufficient documentation

## 2021-10-08 DIAGNOSIS — D649 Anemia, unspecified: Secondary | ICD-10-CM | POA: Insufficient documentation

## 2021-10-08 DIAGNOSIS — N189 Chronic kidney disease, unspecified: Secondary | ICD-10-CM | POA: Diagnosis not present

## 2021-10-08 DIAGNOSIS — R197 Diarrhea, unspecified: Secondary | ICD-10-CM | POA: Insufficient documentation

## 2021-10-08 DIAGNOSIS — R0609 Other forms of dyspnea: Secondary | ICD-10-CM | POA: Insufficient documentation

## 2021-10-08 NOTE — Progress Notes (Signed)
Referral placed to Dr Barry Dienes after CT enterography completed Per Dr Benay Spice

## 2021-10-08 NOTE — Progress Notes (Signed)
[facility] OFFICE PROGRESS NOTE   Diagnosis: Colon cancer  INTERVAL HISTORY:   Louis [name] returns as scheduled.  Completed another cycle of 5-fluorouracil on 09/18/2021.  Reports mild nausea and diarrhea for a few days following chemotherapy.  He feels well at present.  He is working.  Objective:  Vital signs in last 24 hours:  Blood pressure (!) 122/57, pulse 73, temperature 98.1 F (36.7 C), temperature source Oral, resp. rate 18, height '6\' 4"'  (1.93 m), weight 240 lb (108.9 kg), SpO2 100 %.    HEENT: No thrush or ulcers Lymphatics: 2-3 cm mobile left axillary node, firm 4-5 cm left inguinal node with an overlying nodule,?  2 cm below medial right axillary node Resp: Lungs with inspiratory rales at the left lower posterior chest Cardio: Regular rate and rhythm GI: No hepatosplenomegaly Vascular: No leg edema     Portacath/PICC-without erythema  Lab Results:  Lab Results  Component Value Date   WBC 4.7 09/18/2021   HGB 8.9 (L) 09/18/2021   HCT 29.1 (L) 09/18/2021   MCV 86.6 09/18/2021   PLT 173 09/18/2021   NEUTROABS 2.9 09/18/2021    CMP  Lab Results  Component Value Date   NA 142 09/18/2021   K 4.3 09/18/2021   CL 109 09/18/2021   CO2 22 09/18/2021   GLUCOSE 186 (H) 09/18/2021   BUN 37 (H) 09/18/2021   CREATININE 2.50 (H) 09/18/2021   CALCIUM 8.8 (L) 09/18/2021   PROT 6.7 09/18/2021   ALBUMIN 3.8 09/18/2021   AST 7 (L) 09/18/2021   ALT 7 09/18/2021   ALKPHOS 87 09/18/2021   BILITOT 0.3 09/18/2021   GFRNONAA 26 (L) 09/18/2021   GFRAA 28 (L) 09/20/2019    Lab Results  Component Value Date   CEA1 2.61 01/02/2021   CEA 3.76 01/02/2021     Medications: I have reviewed the patient's current medications.   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), isolated mesenteric implant-resected, multiple tumor deposits Sigmoid/descending resection and creation of a descending colostomy 04/10/2016 MSI-stable, no loss of  mismatch repair protein expression Foundation 1- BRAF V600E positive, MS-stable, intermediate tumor mutation burden, no RAS mutation CT abdomen/pelvis 04/02/2016-no evidence of distant metastatic disease Cycle 1 FOLFOX 05/21/2016 Cycle 2 FOLFOX 06/04/2016 Cycle 3 FOLFOX 06/18/2016 Cycle 4 FOLFOX 07/02/2016 (oxaliplatin held secondary to thrombocytopenia) Cycle 5 FOLFOX 07/16/2016 Cycle 6 FOLFOX 07/30/2016 Cycle 7 FOLFOX 08/13/2016 (oxaliplatin held and 5-FU dose reduced) Cycle 8 FOLFOX 09/03/2016 (oxaliplatin held secondary to neuropathy) Cycle 9 FOLFOX 09/17/2016 (oxaliplatin held secondary to neuropathy) Cycle 10 FOLFOX 10/02/2016 Cycle 11 FOLFOX 10/15/2016 (oxaliplatin eliminated from the regimen) Cycle 12 FOLFOX 10/30/2016 (oxaliplatin eliminated) CTs 05/12/2017-no evidence of recurrent disease, right inguinal hernia containing bladder Colonoscopy 07/21/2017, 3 polyps were removed from the descending and transverse colon, fragments of tubular and tubulovillous adenoma CTs 05/19/2018-no evidence of recurrent disease, status post hernia repair, right upper lobe pneumonia CTs 05/20/2019-resolution of right upper lobe pneumonia, no evidence of metastatic disease Colonoscopy 01/25/2020-multiple polyps removed-tubular adenomas, poor preparation, repeat colonoscopy recommended CTs neck, chest, and abdomen/pelvis 08/27/2020- new 2 centimeter left axillary node, new 1.9 cm left inguinal node chronic mildly prominent portacaval node Ultrasound biopsy of left inguinal node on 10/29/2020-metastatic adenocarcinoma consistent with colon adenocarcinoma PET 8/34/1962-[id number] hypermetabolic left inguinal and left axillary nodes, solitary focus of hypermetabolic activity in the right lower quadrant small bowel Cycle 1 FOLFOX 12/19/2020 Cycle 2 FOLFOX 01/02/2021, oxaliplatin dose reduced secondary to neutropenia and thrombocytopenia Cycle 3 FOLFOX 01/16/2021 Cycle 4 FOLFOX  01/30/2021 CTs 02/11/2021-no change in  left inguinal and left axillary nodes, no evidence of disease progression, stable wall thickening involving a loop of distal ileum Cycle 1 FOLFIRI/Avastin 02/27/2021 Cycle 2 FOLFIRI/Avastin 03/13/2021 Cycle 3 FOLFIRI/Avastin 04/03/2021 Cycle 4 FOLFIRI 04/17/2021, Avastin held due to elevated urine protein 05/07/2021-24-hour urine protein elevated 1562 Cycle 5 FOLFIRI 05/08/2021, Avastin held due to elevated urine protein, irinotecan dose reduced due to in general not feeling well after treatment CTs 05/17/2021-left axillary and left inguinal lymph nodes are smaller, no evidence of disease progression Cycle 6 FOLFIRI 05/22/2021, Avastin held due to proteinuria 06/03/2021 24-hour urine with 1.9 g of protein Cycle 7 FOLFIRI 06/12/2021, Avastin held due to proteinuria Cycle 8 FOLFIRI 07/03/2021, Avastin held due to proteinuria Cycle 9 FOLFIRI 07/24/2021, Avastin held due to proteinuria Cycle 10 FOLFIRI 08/19/2021, Avastin held due to proteinuria CTs 09/02/2021-no change in the left axillary, left inguinal, and portacaval lymph nodes.  No mention of the distal small bowel thickening Maintenance 5-fluorouracil 09/18/2021   2.   Chronic renal insufficiency   3.   Hypertension   4.  Inflamed sebaceous cyst at the upper back-status post incision and drainage   5.  Thrombocytopenia secondary chemotherapy-oxaliplatin dose reduced beginning with cycle 3 FOLFOX, oxaliplatin held with cycle 4 and cycle 7 FOLFOX   6.  Oxaliplatin neuropathy-improved   7.  History of Mucositis secondary chemotherapy   8.  Symptoms of pneumonia January 2020-CT 05/19/2018 consistent with right upper lobe pneumonia, Levaquin prescribed 05/20/2018   9.  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  The palpable left axillary and left inguinal nodes appear stable.  His case was presented at the GI tumor conference 09/18/2021.  The  recommendation is to obtain a CT enterography study to further evaluate the possible small bowel lesion.  He will be referred to Dr. Barry Dienes to consider resection of the palpable lymphadenopathy.  He may also have a right axillary node on exam.  He does not wish to receive 5-fluorouracil today due to a planned vacation.  He will complete another cycle of 5-fluorouracil on 10/16/2021.  He will return for an office visit and chemotherapy on 10/30/2021.  Betsy Coder, MD  10/08/2021  9:48 AM

## 2021-10-08 NOTE — Telephone Encounter (Signed)
Notified Mr. Damiani of CT enterography on 6/21 at [facility]. Arrive at 0815 for 0930 scan. Contrast will be provided in radiology. NPO 4 hours prior. Managed care notified to start PA.

## 2021-10-10 ENCOUNTER — Inpatient Hospital Stay: Payer: Medicare Other

## 2021-10-16 ENCOUNTER — Other Ambulatory Visit: Payer: Self-pay

## 2021-10-16 ENCOUNTER — Inpatient Hospital Stay: Payer: Medicare Other

## 2021-10-16 VITALS — BP 128/60 | HR 70 | Temp 97.6°F | Resp 20 | Ht 76.0 in | Wt 242.0 lb

## 2021-10-16 DIAGNOSIS — C187 Malignant neoplasm of sigmoid colon: Secondary | ICD-10-CM | POA: Diagnosis not present

## 2021-10-16 DIAGNOSIS — I129 Hypertensive chronic kidney disease with stage 1 through stage 4 chronic kidney disease, or unspecified chronic kidney disease: Secondary | ICD-10-CM | POA: Diagnosis not present

## 2021-10-16 DIAGNOSIS — Z5111 Encounter for antineoplastic chemotherapy: Secondary | ICD-10-CM | POA: Diagnosis not present

## 2021-10-16 DIAGNOSIS — N189 Chronic kidney disease, unspecified: Secondary | ICD-10-CM | POA: Diagnosis not present

## 2021-10-16 DIAGNOSIS — D649 Anemia, unspecified: Secondary | ICD-10-CM | POA: Diagnosis not present

## 2021-10-16 DIAGNOSIS — C189 Malignant neoplasm of colon, unspecified: Secondary | ICD-10-CM

## 2021-10-16 DIAGNOSIS — C774 Secondary and unspecified malignant neoplasm of inguinal and lower limb lymph nodes: Secondary | ICD-10-CM | POA: Diagnosis not present

## 2021-10-16 LAB — CBC WITH DIFFERENTIAL (CANCER CENTER ONLY)
Abs Immature Granulocytes: 0.02 10*3/uL (ref 0.00–0.07)
Basophils Absolute: 0 10*3/uL (ref 0.0–0.1)
Basophils Relative: 1 %
Eosinophils Absolute: 0.3 10*3/uL (ref 0.0–0.5)
Eosinophils Relative: 6 %
HCT: 26.3 % — ABNORMAL LOW (ref 39.0–52.0)
Hemoglobin: 8 g/dL — ABNORMAL LOW (ref 13.0–17.0)
Immature Granulocytes: 0 %
Lymphocytes Relative: 18 %
Lymphs Abs: 0.9 10*3/uL (ref 0.7–4.0)
MCH: 26.1 pg (ref 26.0–34.0)
MCHC: 30.4 g/dL (ref 30.0–36.0)
MCV: 85.7 fL (ref 80.0–100.0)
Monocytes Absolute: 0.4 10*3/uL (ref 0.1–1.0)
Monocytes Relative: 8 %
Neutro Abs: 3.2 10*3/uL (ref 1.7–7.7)
Neutrophils Relative %: 67 %
Platelet Count: 154 10*3/uL (ref 150–400)
RBC: 3.07 MIL/uL — ABNORMAL LOW (ref 4.22–5.81)
RDW: 16.8 % — ABNORMAL HIGH (ref 11.5–15.5)
WBC Count: 4.8 10*3/uL (ref 4.0–10.5)
nRBC: 0 % (ref 0.0–0.2)

## 2021-10-16 LAB — CMP (CANCER CENTER ONLY)
ALT: 6 U/L (ref 0–44)
AST: 7 U/L — ABNORMAL LOW (ref 15–41)
Albumin: 3.8 g/dL (ref 3.5–5.0)
Alkaline Phosphatase: 72 U/L (ref 38–126)
Anion gap: 10 (ref 5–15)
BUN: 35 mg/dL — ABNORMAL HIGH (ref 8–23)
CO2: 20 mmol/L — ABNORMAL LOW (ref 22–32)
Calcium: 8.8 mg/dL — ABNORMAL LOW (ref 8.9–10.3)
Chloride: 108 mmol/L (ref 98–111)
Creatinine: 2.37 mg/dL — ABNORMAL HIGH (ref 0.61–1.24)
GFR, Estimated: 28 mL/min — ABNORMAL LOW (ref >60)
Glucose, Bld: 171 mg/dL — ABNORMAL HIGH (ref 70–99)
Potassium: 4.1 mmol/L (ref 3.5–5.1)
Sodium: 138 mmol/L (ref 135–145)
Total Bilirubin: 0.3 mg/dL (ref 0.3–1.2)
Total Protein: 6.6 g/dL (ref 6.5–8.1)

## 2021-10-16 MED ORDER — FLUOROURACIL CHEMO INJECTION 2.5 GM/50ML
300.0000 mg/m2 | Freq: Once | INTRAVENOUS | Status: AC
  Administered 2021-10-16: 750 mg via INTRAVENOUS
  Filled 2021-10-16: qty 15

## 2021-10-16 MED ORDER — SODIUM CHLORIDE 0.9 % IV SOLN: Freq: Once | INTRAVENOUS | Status: AC

## 2021-10-16 MED ORDER — SODIUM CHLORIDE 0.9 % IV SOLN
300.0000 mg/m2 | Freq: Once | INTRAVENOUS | Status: AC
  Administered 2021-10-16: 726 mg via INTRAVENOUS
  Filled 2021-10-16: qty 25

## 2021-10-16 MED ORDER — SODIUM CHLORIDE 0.9 % IV SOLN
1800.0000 mg/m2 | INTRAVENOUS | Status: DC
  Administered 2021-10-16: 4350 mg via INTRAVENOUS
  Filled 2021-10-16: qty 87

## 2021-10-16 NOTE — Patient Instructions (Addendum)
[facility]   Discharge Instructions: Thank you for choosing [facility] to provide your oncology and hematology care.   If you have a lab appointment with the [facility], please go directly to the [facility] and check in at the registration area.   Wear comfortable clothing and clothing appropriate for easy access to any Portacath or PICC line.   We strive to give you quality time with your provider. You may need to reschedule your appointment if you arrive late (15 or more minutes).  Arriving late affects you and other patients whose appointments are after yours.  Also, if you miss three or more appointments without notifying the office, you may be dismissed from the clinic at the provider's discretion.      For prescription refill requests, have your pharmacy contact our office and allow 72 hours for refills to be completed.    Today you received the following chemotherapy and/or immunotherapy agents Leucovorin & Flourouracil (ADRUCIL).      To help prevent nausea and vomiting after your treatment, we encourage you to take your nausea medication as directed.  BELOW ARE SYMPTOMS THAT SHOULD BE REPORTED IMMEDIATELY: *FEVER GREATER THAN 100.4 F (38 C) OR HIGHER *CHILLS OR SWEATING *NAUSEA AND VOMITING THAT IS NOT CONTROLLED WITH YOUR NAUSEA MEDICATION *UNUSUAL SHORTNESS OF BREATH *UNUSUAL BRUISING OR BLEEDING *URINARY PROBLEMS (pain or burning when urinating, or frequent urination) *BOWEL PROBLEMS (unusual diarrhea, constipation, pain near the anus) TENDERNESS IN MOUTH AND THROAT WITH OR WITHOUT PRESENCE OF ULCERS (sore throat, sores in mouth, or a toothache) UNUSUAL RASH, SWELLING OR PAIN  UNUSUAL VAGINAL DISCHARGE OR ITCHING   Items with * indicate a potential emergency and should be followed up as soon as possible or go to the Emergency Department if any problems should occur.  Please show the CHEMOTHERAPY ALERT CARD or IMMUNOTHERAPY  ALERT CARD at check-in to the Emergency Department and triage nurse.  Should you have questions after your visit or need to cancel or reschedule your appointment, please contact [facility]  Dept: [phone]  and follow the prompts.  Office hours are 8:00 a.m. to 4:30 p.m. Monday - Friday. Please note that voicemails left after 4:00 p.m. may not be returned until the following business day.  We are closed weekends and major holidays. You have access to a nurse at all times for urgent questions. Please call the main number to the clinic Dept: [phone] and follow the prompts.   For any non-urgent questions, you may also contact your provider using MyChart. We now offer e-Visits for anyone 107 and older to request care online for non-urgent symptoms. For details visit [email or web address].   Also download the MyChart app! Go to the app store, search "MyChart", open the app, select [facility], and log in with your MyChart username and password.  Masks are optional in the cancer centers. If you would like for your care team to wear a mask while they are taking care of you, please let them know. For doctor visits, patients may have with them one support person who is at least 75 years old. At this time, visitors are not allowed in the infusion area.  Leucovorin injection What is this medication? LEUCOVORIN (loo koe VOR in) is used to prevent or treat the harmful effects of some medicines. This medicine is used to treat anemia caused by a low amount of folic acid in the body. It is also used with  5-fluorouracil (5-FU) to treat colon cancer. This medicine may be used for other purposes; ask your health care provider or pharmacist if you have questions. What should I tell my care team before I take this medication? They need to know if you have any of these conditions: anemia from low levels of vitamin B-12 in the blood an unusual or allergic reaction to leucovorin, folic  acid, other medicines, foods, dyes, or preservatives pregnant or trying to get pregnant breast-feeding How should I use this medication? This medicine is for injection into a muscle or into a vein. It is given by a health care professional in a hospital or clinic setting. Talk to your pediatrician regarding the use of this medicine in children. Special care may be needed. Overdosage: If you think you have taken too much of this medicine contact a poison control center or emergency room at once. NOTE: This medicine is only for you. Do not share this medicine with others. What if I miss a dose? This does not apply. What may interact with this medication? capecitabine fluorouracil phenobarbital phenytoin primidone trimethoprim-sulfamethoxazole This list may not describe all possible interactions. Give your health care provider a list of all the medicines, herbs, non-prescription drugs, or dietary supplements you use. Also tell them if you smoke, drink alcohol, or use illegal drugs. Some items may interact with your medicine. What should I watch for while using this medication? Your condition will be monitored carefully while you are receiving this medicine. This medicine may increase the side effects of 5-fluorouracil, 5-FU. Tell your doctor or health care professional if you have diarrhea or mouth sores that do not get better or that get worse. What side effects may I notice from receiving this medication? Side effects that you should report to your doctor or health care professional as soon as possible: allergic reactions like skin rash, itching or hives, swelling of the face, lips, or tongue breathing problems fever, infection mouth sores unusual bleeding or bruising unusually weak or tired Side effects that usually do not require medical attention (report to your doctor or health care professional if they continue or are bothersome): constipation or diarrhea loss of appetite nausea,  vomiting This list may not describe all possible side effects. Call your doctor for medical advice about side effects. You may report side effects to FDA at 1-800-FDA-1088. Where should I keep my medication? This drug is given in a hospital or clinic and will not be stored at home. NOTE: This sheet is a summary. It may not cover all possible information. If you have questions about this medicine, talk to your doctor, pharmacist, or health care provider.  2023 Elsevier/Gold Standard (2007-10-28 00:00:00)  Fluorouracil, 5-FU injection What is this medication? FLUOROURACIL, 5-FU (flure oh YOOR a sil) is a chemotherapy drug. It slows the growth of cancer cells. This medicine is used to treat many types of cancer like breast cancer, colon or rectal cancer, pancreatic cancer, and stomach cancer. This medicine may be used for other purposes; ask your health care provider or pharmacist if you have questions. COMMON BRAND NAME(S): Adrucil What should I tell my care team before I take this medication? They need to know if you have any of these conditions: blood disorders dihydropyrimidine dehydrogenase (DPD) deficiency infection (especially a virus infection such as chickenpox, cold sores, or herpes) kidney disease liver disease malnourished, poor nutrition recent or ongoing radiation therapy an unusual or allergic reaction to fluorouracil, other chemotherapy, other medicines, foods, dyes, or preservatives pregnant  or trying to get pregnant breast-feeding How should I use this medication? This drug is given as an infusion or injection into a vein. It is administered in a hospital or clinic by a specially trained health care professional. Talk to your pediatrician regarding the use of this medicine in children. Special care may be needed. Overdosage: If you think you have taken too much of this medicine contact a poison control center or emergency room at once. NOTE: This medicine is only for you.  Do not share this medicine with others. What if I miss a dose? It is important not to miss your dose. Call your doctor or health care professional if you are unable to keep an appointment. What may interact with this medication? Do not take this medicine with any of the following medications: live virus vaccines This medicine may also interact with the following medications: medicines that treat or prevent blood clots like warfarin, enoxaparin, and dalteparin This list may not describe all possible interactions. Give your health care provider a list of all the medicines, herbs, non-prescription drugs, or dietary supplements you use. Also tell them if you smoke, drink alcohol, or use illegal drugs. Some items may interact with your medicine. What should I watch for while using this medication? Visit your doctor for checks on your progress. This drug may make you feel generally unwell. This is not uncommon, as chemotherapy can affect healthy cells as well as cancer cells. Report any side effects. Continue your course of treatment even though you feel ill unless your doctor tells you to stop. In some cases, you may be given additional medicines to help with side effects. Follow all directions for their use. Call your doctor or health care professional for advice if you get a fever, chills or sore throat, or other symptoms of a cold or flu. Do not treat yourself. This drug decreases your body's ability to fight infections. Try to avoid being around people who are sick. This medicine may increase your risk to bruise or bleed. Call your doctor or health care professional if you notice any unusual bleeding. Be careful brushing and flossing your teeth or using a toothpick because you may get an infection or bleed more easily. If you have any dental work done, tell your dentist you are receiving this medicine. Avoid taking products that contain aspirin, acetaminophen, ibuprofen, naproxen, or ketoprofen unless  instructed by your doctor. These medicines may hide a fever. Do not become pregnant while taking this medicine. Women should inform their doctor if they wish to become pregnant or think they might be pregnant. There is a potential for serious side effects to an unborn child. Talk to your health care professional or pharmacist for more information. Do not breast-feed an infant while taking this medicine. Men should inform their doctor if they wish to father a child. This medicine may lower sperm counts. Do not treat diarrhea with over the counter products. Contact your doctor if you have diarrhea that lasts more than 2 days or if it is severe and watery. This medicine can make you more sensitive to the sun. Keep out of the sun. If you cannot avoid being in the sun, wear protective clothing and use sunscreen. Do not use sun lamps or tanning beds/booths. What side effects may I notice from receiving this medication? Side effects that you should report to your doctor or health care professional as soon as possible: allergic reactions like skin rash, itching or hives, swelling of the face,  lips, or tongue low blood counts - this medicine may decrease the number of white blood cells, red blood cells and platelets. You may be at increased risk for infections and bleeding. signs of infection - fever or chills, cough, sore throat, pain or difficulty passing urine signs of decreased platelets or bleeding - bruising, pinpoint red spots on the skin, black, tarry stools, blood in the urine signs of decreased red blood cells - unusually weak or tired, fainting spells, lightheadedness breathing problems changes in vision chest pain mouth sores nausea and vomiting pain, swelling, redness at site where injected pain, tingling, numbness in the hands or feet redness, swelling, or sores on hands or feet stomach pain unusual bleeding Side effects that usually do not require medical attention (report to your doctor  or health care professional if they continue or are bothersome): changes in finger or toe nails diarrhea dry or itchy skin hair loss headache loss of appetite sensitivity of eyes to the light stomach upset unusually teary eyes This list may not describe all possible side effects. Call your doctor for medical advice about side effects. You may report side effects to FDA at 1-800-FDA-1088. Where should I keep my medication? This drug is given in a hospital or clinic and will not be stored at home. NOTE: This sheet is a summary. It may not cover all possible information. If you have questions about this medicine, talk to your doctor, pharmacist, or health care provider.  2023 Elsevier/Gold Standard (2021-03-22 00:00:00)  The chemotherapy medication bag should finish at 46 hours, 96 hours, or 7 days. For example, if your pump is scheduled for 46 hours and it was put on at 4:00 p.m., it should finish at 2:00 p.m. the day it is scheduled to come off regardless of your appointment time.     Estimated time to finish at 09:30 a.m. on Friday 10/18/2021.   If the display on your pump reads "Low Volume" and it is beeping, take the batteries out of the pump and come to the cancer center for it to be taken off.   If the pump alarms go off prior to the pump reading "Low Volume" then call [phone] and someone can assist you.  If the plunger comes out and the chemotherapy medication is leaking out, please use your home chemo spill kit to clean up the spill. Do NOT use paper towels or other household products.  If you have problems or questions regarding your pump, please call either [phone] (24 hours a day) or the cancer center Monday-Friday 8:00 a.m.- 4:30 p.m. at the clinic number and we will assist you. If you are unable to get assistance, then go to the nearest Emergency Department and ask the staff to contact the IV team for assistance.

## 2021-10-16 NOTE — Progress Notes (Signed)
Opened in error

## 2021-10-16 NOTE — Patient Instructions (Signed)

## 2021-10-18 ENCOUNTER — Inpatient Hospital Stay: Payer: Medicare Other

## 2021-10-18 VITALS — BP 111/69 | HR 78 | Temp 98.2°F | Resp 18

## 2021-10-18 DIAGNOSIS — Z5111 Encounter for antineoplastic chemotherapy: Secondary | ICD-10-CM | POA: Diagnosis not present

## 2021-10-18 DIAGNOSIS — C774 Secondary and unspecified malignant neoplasm of inguinal and lower limb lymph nodes: Secondary | ICD-10-CM | POA: Diagnosis not present

## 2021-10-18 DIAGNOSIS — I129 Hypertensive chronic kidney disease with stage 1 through stage 4 chronic kidney disease, or unspecified chronic kidney disease: Secondary | ICD-10-CM | POA: Diagnosis not present

## 2021-10-18 DIAGNOSIS — C187 Malignant neoplasm of sigmoid colon: Secondary | ICD-10-CM | POA: Diagnosis not present

## 2021-10-18 DIAGNOSIS — N189 Chronic kidney disease, unspecified: Secondary | ICD-10-CM | POA: Diagnosis not present

## 2021-10-18 DIAGNOSIS — D649 Anemia, unspecified: Secondary | ICD-10-CM | POA: Diagnosis not present

## 2021-10-18 DIAGNOSIS — C189 Malignant neoplasm of colon, unspecified: Secondary | ICD-10-CM

## 2021-10-18 MED ORDER — HEPARIN SOD (PORK) LOCK FLUSH 100 UNIT/ML IV SOLN
500.0000 [IU] | Freq: Once | INTRAVENOUS | Status: AC | PRN
  Administered 2021-10-18: 500 [IU]

## 2021-10-18 MED ORDER — SODIUM CHLORIDE 0.9% FLUSH
10.0000 mL | INTRAVENOUS | Status: DC | PRN
  Administered 2021-10-18: 10 mL

## 2021-10-18 NOTE — Patient Instructions (Signed)
Heparin injection ?What is this medication? ?HEPARIN (HEP a rin) is an anticoagulant. It is used to treat or prevent clots in the veins, arteries, lungs, or heart. It stops clots from forming or getting bigger. This medicine prevents clotting during open-heart surgery, dialysis, or in patients who are confined to bed. ?This medicine may be used for other purposes; ask your health care provider or pharmacist if you have questions. ?COMMON BRAND NAME(S): Hep-Lock, Hep-Lock U/P, Hepflush-10, Monoject Prefill Advanced Heparin Lock Flush, SASH Normal Saline and Heparin ?What should I tell my care team before I take this medication? ?They need to know if you have any of these conditions: ?bleeding disorders, such as hemophilia or low blood platelets ?bowel disease or diverticulitis ?endocarditis ?high blood pressure ?liver disease ?recent surgery or delivery of a baby ?stomach ulcers ?an unusual or allergic reaction to heparin, benzyl alcohol, sulfites, other medicines, foods, dyes, or preservatives ?pregnant or trying to get pregnant ?breast-feeding ?How should I use this medication? ?This medicine is given by injection or infusion into a vein. It can also be given by injection of small amounts under the skin. It is usually given by a health care professional in a hospital or clinic setting. ?If you get this medicine at home, you will be taught how to prepare and give this medicine. Use exactly as directed. Take your medicine at regular intervals. Do not take it more often than directed. Do not stop taking except on your doctor's advice. Stopping this medicine may increase your risk of a blot clot. Be sure to refill your prescription before you run out of medicine. ?It is important that you put your used needles and syringes in a special sharps container. Do not put them in a trash can. If you do not have a sharps container, call your pharmacist or healthcare provider to get one. ?Talk to your pediatrician regarding the  use of this medicine in children. While this medicine may be prescribed for children for selected conditions, precautions do apply. ?Overdosage: If you think you have taken too much of this medicine contact a poison control center or emergency room at once. ?NOTE: This medicine is only for you. Do not share this medicine with others. ?What if I miss a dose? ?If you miss a dose, take it as soon as you can. If it is almost time for your next dose, take only that dose. Do not take double or extra doses. ?What may interact with this medication? ?Do not take this medicine with any of the following medications: ?aspirin and aspirin-like drugs ?mifepristone ?medicines that treat or prevent blood clots like warfarin, enoxaparin, and dalteparin ?palifermin ?protamine ?This medicine may also interact with the following medications: ?dextran ?digoxin ?hydroxychloroquine ?medicines for treating colds or allergies ?nicotine ?NSAIDs, medicines for pain and inflammation, like ibuprofen or naproxen ?phenylbutazone ?tetracycline antibiotics ?This list may not describe all possible interactions. Give your health care provider a list of all the medicines, herbs, non-prescription drugs, or dietary supplements you use. Also tell them if you smoke, drink alcohol, or use illegal drugs. Some items may interact with your medicine. ?What should I watch for while using this medication? ?Visit your healthcare professional for regular checks on your progress. You may need blood work done while you are taking this medicine. Your condition will be monitored carefully while you are receiving this medicine. It is important not to miss any appointments. ?Wear a medical ID bracelet or chain, and carry a card that describes your disease and details   of your medicine and dosage times. ?Notify your doctor or healthcare professional at once if you have cold, blue hands or feet. ?If you are going to need surgery or other procedure, tell your healthcare  professional that you are using this medicine. ?Avoid sports and activities that might cause injury while you are using this medicine. Severe falls or injuries can cause unseen bleeding. Be careful when using sharp tools or knives. Consider using an electric razor. Take special care brushing or flossing your teeth. Report any injuries, bruising, or red spots on the skin to your healthcare professional. ?Using this medicine for a long time may weaken your bones and increase the risk of bone fractures. ?You should make sure that you get enough calcium and vitamin D while you are taking this medicine. Discuss the foods you eat and the vitamins you take with your healthcare professional. ?Wear a medical ID bracelet or chain. Carry a card that describes your disease and details of your medicine and dosage times. ?What side effects may I notice from receiving this medication? ?Side effects that you should report to your doctor or health care professional as soon as possible: ?allergic reactions like skin rash, itching or hives, swelling of the face, lips, or tongue ?bone pain ?fever, chills ?nausea, vomiting ?signs and symptoms of bleeding such as bloody or black, tarry stools; red or dark-brown urine; spitting up blood or brown material that looks like coffee grounds; red spots on the skin; unusual bruising or bleeding from the eye, gums, or nose ?signs and symptoms of a blood clot such as chest pain; shortness of breath; pain, swelling, or warmth in the leg ?signs and symptoms of a stroke such as changes in vision; confusion; trouble speaking or understanding; severe headaches; sudden numbness or weakness of the face, arm or leg; trouble walking; dizziness; loss of coordination ?Side effects that usually do not require medical attention (report to your doctor or health care professional if they continue or are bothersome): ?hair loss ?pain, redness, or irritation at site where injected ?This list may not describe all  possible side effects. Call your doctor for medical advice about side effects. You may report side effects to FDA at 1-800-FDA-1088. ?Where should I keep my medication? ?Keep out of the reach of children. ?Store unopened vials at room temperature between 15 and 30 degrees C (59 and 86 degrees F). Do not freeze. Do not use if solution is discolored or particulate matter is present. Throw away any unused medicine after the expiration date. ?NOTE: This sheet is a summary. It may not cover all possible information. If you have questions about this medicine, talk to your doctor, pharmacist, or health care provider. ?? 2023 Elsevier/Gold Standard (2020-05-31 00:00:00) ? ?

## 2021-10-23 ENCOUNTER — Ambulatory Visit (HOSPITAL_BASED_OUTPATIENT_CLINIC_OR_DEPARTMENT_OTHER): Payer: Medicare Other

## 2021-10-27 ENCOUNTER — Other Ambulatory Visit: Payer: Self-pay | Admitting: Oncology

## 2021-10-28 ENCOUNTER — Ambulatory Visit (HOSPITAL_BASED_OUTPATIENT_CLINIC_OR_DEPARTMENT_OTHER)
Admission: RE | Admit: 2021-10-28 | Discharge: 2021-10-28 | Disposition: A | Discharge status: Home / Self Care | Payer: Medicare Other | Source: Ambulatory Visit | Attending: Oncology | Admitting: Oncology

## 2021-10-28 DIAGNOSIS — N400 Enlarged prostate without lower urinary tract symptoms: Secondary | ICD-10-CM | POA: Diagnosis not present

## 2021-10-28 DIAGNOSIS — C189 Malignant neoplasm of colon, unspecified: Secondary | ICD-10-CM | POA: Insufficient documentation

## 2021-10-28 DIAGNOSIS — K4090 Unilateral inguinal hernia, without obstruction or gangrene, not specified as recurrent: Secondary | ICD-10-CM | POA: Diagnosis not present

## 2021-10-29 ENCOUNTER — Encounter: Payer: Self-pay | Admitting: Nurse Practitioner

## 2021-10-29 ENCOUNTER — Inpatient Hospital Stay: Payer: Medicare Other

## 2021-10-29 ENCOUNTER — Other Ambulatory Visit: Payer: Self-pay | Admitting: *Deleted

## 2021-10-29 ENCOUNTER — Encounter: Payer: Self-pay | Admitting: *Deleted

## 2021-10-29 ENCOUNTER — Inpatient Hospital Stay (HOSPITAL_BASED_OUTPATIENT_CLINIC_OR_DEPARTMENT_OTHER): Payer: Medicare Other | Admitting: Nurse Practitioner

## 2021-10-29 VITALS — BP 133/64 | HR 56

## 2021-10-29 VITALS — BP 122/58 | HR 78 | Temp 98.2°F | Resp 18 | Ht 76.0 in | Wt 241.0 lb

## 2021-10-29 DIAGNOSIS — D649 Anemia, unspecified: Secondary | ICD-10-CM

## 2021-10-29 DIAGNOSIS — C189 Malignant neoplasm of colon, unspecified: Secondary | ICD-10-CM

## 2021-10-29 DIAGNOSIS — Z5111 Encounter for antineoplastic chemotherapy: Secondary | ICD-10-CM | POA: Diagnosis not present

## 2021-10-29 DIAGNOSIS — N189 Chronic kidney disease, unspecified: Secondary | ICD-10-CM | POA: Diagnosis not present

## 2021-10-29 DIAGNOSIS — C187 Malignant neoplasm of sigmoid colon: Secondary | ICD-10-CM | POA: Diagnosis not present

## 2021-10-29 DIAGNOSIS — C774 Secondary and unspecified malignant neoplasm of inguinal and lower limb lymph nodes: Secondary | ICD-10-CM | POA: Diagnosis not present

## 2021-10-29 DIAGNOSIS — I129 Hypertensive chronic kidney disease with stage 1 through stage 4 chronic kidney disease, or unspecified chronic kidney disease: Secondary | ICD-10-CM | POA: Diagnosis not present

## 2021-10-29 LAB — CBC WITH DIFFERENTIAL (CANCER CENTER ONLY)
Abs Immature Granulocytes: 0.01 10*3/uL (ref 0.00–0.07)
Basophils Absolute: 0 10*3/uL (ref 0.0–0.1)
Basophils Relative: 1 %
Eosinophils Absolute: 0.2 10*3/uL (ref 0.0–0.5)
Eosinophils Relative: 5 %
HCT: 25.2 % — ABNORMAL LOW (ref 39.0–52.0)
Hemoglobin: 7.7 g/dL — ABNORMAL LOW (ref 13.0–17.0)
Immature Granulocytes: 0 %
Lymphocytes Relative: 23 %
Lymphs Abs: 0.9 10*3/uL (ref 0.7–4.0)
MCH: 25.6 pg — ABNORMAL LOW (ref 26.0–34.0)
MCHC: 30.6 g/dL (ref 30.0–36.0)
MCV: 83.7 fL (ref 80.0–100.0)
Monocytes Absolute: 0.4 10*3/uL (ref 0.1–1.0)
Monocytes Relative: 11 %
Neutro Abs: 2.3 10*3/uL (ref 1.7–7.7)
Neutrophils Relative %: 60 %
Platelet Count: 142 10*3/uL — ABNORMAL LOW (ref 150–400)
RBC: 3.01 MIL/uL — ABNORMAL LOW (ref 4.22–5.81)
RDW: 17.2 % — ABNORMAL HIGH (ref 11.5–15.5)
WBC Count: 3.9 10*3/uL — ABNORMAL LOW (ref 4.0–10.5)
nRBC: 0 % (ref 0.0–0.2)

## 2021-10-29 LAB — CMP (CANCER CENTER ONLY)
ALT: 7 U/L (ref 0–44)
AST: 8 U/L — ABNORMAL LOW (ref 15–41)
Albumin: 3.7 g/dL (ref 3.5–5.0)
Alkaline Phosphatase: 75 U/L (ref 38–126)
Anion gap: 10 (ref 5–15)
BUN: 37 mg/dL — ABNORMAL HIGH (ref 8–23)
CO2: 21 mmol/L — ABNORMAL LOW (ref 22–32)
Calcium: 8.7 mg/dL — ABNORMAL LOW (ref 8.9–10.3)
Chloride: 110 mmol/L (ref 98–111)
Creatinine: 2.64 mg/dL — ABNORMAL HIGH (ref 0.61–1.24)
GFR, Estimated: 25 mL/min — ABNORMAL LOW (ref >60)
Glucose, Bld: 145 mg/dL — ABNORMAL HIGH (ref 70–99)
Potassium: 4.1 mmol/L (ref 3.5–5.1)
Sodium: 141 mmol/L (ref 135–145)
Total Bilirubin: 0.4 mg/dL (ref 0.3–1.2)
Total Protein: 6.3 g/dL — ABNORMAL LOW (ref 6.5–8.1)

## 2021-10-29 LAB — FERRITIN: Ferritin: 6 ng/mL — ABNORMAL LOW (ref 24–336)

## 2021-10-29 LAB — PREPARE RBC (CROSSMATCH)

## 2021-10-29 LAB — TOTAL PROTEIN, URINE DIPSTICK: Protein, ur: 30 mg/dL — AB

## 2021-10-29 MED ORDER — FLUOROURACIL CHEMO INJECTION 2.5 GM/50ML
300.0000 mg/m2 | Freq: Once | INTRAVENOUS | Status: AC
  Administered 2021-10-29: 750 mg via INTRAVENOUS
  Filled 2021-10-29: qty 15

## 2021-10-29 MED ORDER — SODIUM CHLORIDE 0.9 % IV SOLN
1800.0000 mg/m2 | INTRAVENOUS | Status: DC
  Administered 2021-10-29: 4350 mg via INTRAVENOUS
  Filled 2021-10-29: qty 87

## 2021-10-29 MED ORDER — SODIUM CHLORIDE 0.9 % IV SOLN
300.0000 mg/m2 | Freq: Once | INTRAVENOUS | Status: AC
  Administered 2021-10-29: 726 mg via INTRAVENOUS
  Filled 2021-10-29: qty 25

## 2021-10-29 MED ORDER — SODIUM CHLORIDE 0.9 % IV SOLN: Freq: Once | INTRAVENOUS | Status: AC

## 2021-10-29 NOTE — Progress Notes (Signed)
Patient seen by Lonna Cobb NP today  Vitals are within treatment parameters.  Labs reviewed by Lonna Cobb NP  CBC-OK to treat. Per Dr. Truett Perna: OK to treat without CMP results. Urine protein result N/A Hgb 7.7--declines transfusion.  Per physician team, patient is ready for treatment and there are NO modifications to the treatment plan.   Explained how to obtain stool card samples and to bring back to [facility] when completed.

## 2021-10-29 NOTE — Progress Notes (Signed)
Ok to treat with elevated Scr and Hg 7.7 today per MD.  Richardean Sale, RPH, BCPS, BCOP 10/29/2021 9:24 AM

## 2021-10-30 LAB — ERYTHROPOIETIN: Erythropoietin: 117.9 m[IU]/mL — ABNORMAL HIGH (ref 2.6–18.5)

## 2021-10-31 ENCOUNTER — Inpatient Hospital Stay: Payer: Medicare Other

## 2021-10-31 ENCOUNTER — Encounter: Payer: Self-pay | Admitting: *Deleted

## 2021-10-31 ENCOUNTER — Telehealth: Payer: Self-pay

## 2021-10-31 ENCOUNTER — Other Ambulatory Visit: Payer: Self-pay | Admitting: Nurse Practitioner

## 2021-10-31 VITALS — BP 140/72 | HR 61 | Temp 98.1°F | Resp 16

## 2021-10-31 DIAGNOSIS — N189 Chronic kidney disease, unspecified: Secondary | ICD-10-CM | POA: Diagnosis not present

## 2021-10-31 DIAGNOSIS — D649 Anemia, unspecified: Secondary | ICD-10-CM | POA: Diagnosis not present

## 2021-10-31 DIAGNOSIS — C189 Malignant neoplasm of colon, unspecified: Secondary | ICD-10-CM

## 2021-10-31 DIAGNOSIS — C187 Malignant neoplasm of sigmoid colon: Secondary | ICD-10-CM | POA: Diagnosis not present

## 2021-10-31 DIAGNOSIS — C774 Secondary and unspecified malignant neoplasm of inguinal and lower limb lymph nodes: Secondary | ICD-10-CM | POA: Diagnosis not present

## 2021-10-31 DIAGNOSIS — Z5111 Encounter for antineoplastic chemotherapy: Secondary | ICD-10-CM | POA: Diagnosis not present

## 2021-10-31 DIAGNOSIS — I129 Hypertensive chronic kidney disease with stage 1 through stage 4 chronic kidney disease, or unspecified chronic kidney disease: Secondary | ICD-10-CM | POA: Diagnosis not present

## 2021-10-31 MED ORDER — SODIUM CHLORIDE 0.9% FLUSH
10.0000 mL | INTRAVENOUS | Status: DC | PRN
  Administered 2021-10-31: 10 mL

## 2021-10-31 MED ORDER — HEPARIN SOD (PORK) LOCK FLUSH 100 UNIT/ML IV SOLN
500.0000 [IU] | Freq: Once | INTRAVENOUS | Status: AC | PRN
  Administered 2021-10-31: 500 [IU]

## 2021-10-31 MED ORDER — SODIUM CHLORIDE 0.9% IV SOLUTION
250.0000 mL | Freq: Once | INTRAVENOUS | Status: AC
  Administered 2021-10-31: 250 mL via INTRAVENOUS

## 2021-10-31 NOTE — Patient Instructions (Addendum)
Your Ferritin level was 6.  Please start taking iron 325 mg twice daily.  You can purchase iron supplements at any pharmacy.  Iron-Rich Diet  Iron is a mineral that helps your body produce hemoglobin. Hemoglobin is a protein in red blood cells that carries oxygen to your body's tissues. Eating too little iron may cause you to feel weak and tired, and it can increase your risk of infection. Iron is naturally found in many foods, and many foods have iron added to them (are iron-fortified). You may need to follow an iron-rich diet if you do not have enough iron in your body due to certain medical conditions. The amount of iron that you need each day depends on your age, your sex, and any medical conditions you have. Follow instructions from your health care provider or a dietitian about how much iron you should eat each day. What are tips for following this plan? Reading food labels Check food labels to see how many milligrams (mg) of iron are in each serving. Cooking Cook foods in pots and pans that are made from iron. Take these steps to make it easier for your body to absorb iron from certain foods: Soak beans overnight before cooking. Soak whole grains overnight and drain them before using. Ferment flours before baking, such as by using yeast in bread dough. Meal planning When you eat foods that contain iron, you should eat them with foods that are high in vitamin C. These include oranges, peppers, tomatoes, potatoes, and mangoes. Vitamin C helps your body absorb iron. Certain foods and drinks prevent your body from absorbing iron properly. Avoid eating these foods in the same meal as iron-rich foods or with iron supplements. These foods include: Coffee, black tea, and red wine. Milk, dairy products, and foods that are high in calcium. Beans and soybeans. Whole grains. General information Take iron supplements only as told by your health care provider. An overdose of iron can be  life-threatening. If you were prescribed iron supplements, take them with orange juice or a vitamin C supplement. When you eat iron-fortified foods or take an iron supplement, you should also eat foods that naturally contain iron, such as meat, poultry, and fish. Eating naturally iron-rich foods helps your body absorb the iron that is added to other foods or contained in a supplement. Iron from animal sources is better absorbed than iron from plant sources. What foods should I eat? Fruits Prunes. Raisins. Eat fruits high in vitamin C, such as oranges, grapefruits, and strawberries, with iron-rich foods. Vegetables Spinach (cooked). Green peas. Broccoli. Fermented vegetables. Eat vegetables high in vitamin C, such as leafy greens, potatoes, bell peppers, and tomatoes, with iron-rich foods. Grains Iron-fortified breakfast cereal. Iron-fortified whole-wheat bread. Enriched rice. Sprouted grains. Meats and other proteins Beef liver. Beef. [place]. Chicken. Oysters. Shrimp. [place]. Sardines. Chickpeas. Nuts. Tofu. Pumpkin seeds. Beverages Tomato juice. Fresh orange juice. Prune juice. Hibiscus tea. Iron-fortified instant breakfast shakes. Sweets and desserts Blackstrap molasses. Seasonings and condiments Tahini. Fermented soy sauce. Other foods Wheat germ. The items listed above may not be a complete list of recommended foods and beverages. Contact a dietitian for more information. What foods should I limit? These are foods that should be limited while eating iron-rich foods as they can reduce the absorption of iron in your body. Grains Whole grains. Bran cereal. Bran flour. Meats and other proteins Soybeans. Products made from soy protein. Black beans. Lentils. Mung beans. Split peas. Dairy Milk. Cream. Cheese. Yogurt. Cottage cheese. Beverages  Coffee. Black tea. Red wine. Sweets and desserts Cocoa. Chocolate. Ice cream. Seasonings and condiments Basil. Oregano. Large amounts of  parsley. The items listed above may not be a complete list of foods and beverages you should limit. Contact a dietitian for more information. Summary Iron is a mineral that helps your body produce hemoglobin. Hemoglobin is a protein in red blood cells that carries oxygen to your body's tissues. Iron is naturally found in many foods, and many foods have iron added to them (are iron-fortified). When you eat foods that contain iron, you should eat them with foods that are high in vitamin C. Vitamin C helps your body absorb iron. Certain foods and drinks prevent your body from absorbing iron properly, such as whole grains and dairy products. You should avoid eating these foods in the same meal as iron-rich foods or with iron supplements. This information is not intended to replace advice given to you by your health care provider. Make sure you discuss any questions you have with your health care provider. Document Revised: 04/02/2020 Document Reviewed: 04/02/2020 Elsevier Patient Education  [place].  Blood Transfusion, Adult, Care After This sheet gives you information about how to care for yourself after your procedure. Your doctor may also give you more specific instructions. If you have problems or questions, contact your doctor. What can I expect after the procedure? After the procedure, it is common to have: Bruising and soreness at the IV site. A headache. Follow these instructions at home: Insertion site care     Follow instructions from your doctor about how to take care of your insertion site. This is where an IV tube was put into your vein. Make sure you: Wash your hands with soap and water before and after you change your bandage (dressing). If you cannot use soap and water, use hand sanitizer. Change your bandage as told by your doctor. Check your insertion site every day for signs of infection. Check for: Redness, swelling, or pain. Bleeding from the site. Warmth. Pus  or a bad smell. General instructions Take over-the-counter and prescription medicines only as told by your doctor. Rest as told by your doctor. Go back to your normal activities as told by your doctor. Keep all follow-up visits as told by your doctor. This is important. Contact a doctor if: You have itching or red, swollen areas of skin (hives). You feel worried or nervous (anxious). You feel weak after doing your normal activities. You have redness, swelling, warmth, or pain around the insertion site. You have blood coming from the insertion site, and the blood does not stop with pressure. You have pus or a bad smell coming from the insertion site. Get help right away if: You have signs of a serious reaction. This may be coming from an allergy or the body's defense system (immune system). Signs include: Trouble breathing or shortness of breath. Swelling of the face or feeling warm (flushed). Fever or chills. Head, chest, or back pain. Dark pee (urine) or blood in the pee. Widespread rash. Fast heartbeat. Feeling dizzy or light-headed. You may receive your blood transfusion in an outpatient setting. If so, you will be told whom to contact to report any reactions. These symptoms may be an emergency. Do not wait to see if the symptoms will go away. Get medical help right away. Call your local emergency services (911 in the [place] Do not drive yourself to the hospital. Summary Bruising and soreness at the IV site are common. Check  your insertion site every day for signs of infection. Rest as told by your doctor. Go back to your normal activities as told by your doctor. Get help right away if you have signs of a serious reaction. This information is not intended to replace advice given to you by your health care provider. Make sure you discuss any questions you have with your health care provider. Document Revised: 08/16/2020 Document Reviewed: 10/14/2018 Elsevier Patient Education  [place].

## 2021-10-31 NOTE — Progress Notes (Signed)
[name] discussed his anemia w/daughter and now is asking about the procrit injections to help his anemia. Per Dr. Benay Spice, with Epo level high and ferritin low, we need to correct the IDA with oral iron as suggested by MD. He is not agreeable to taking oral iron, saying he is tired of taking so many pills daily. He plans to eating a lot of steak, chicken liver and greens. Dr. Benay Spice made aware.

## 2021-10-31 NOTE — Telephone Encounter (Signed)
Spoke to patient about information listed below.  Patient verbalized understanding. Written and verbal information given about how to add iron to his diet.

## 2021-10-31 NOTE — Telephone Encounter (Signed)
-----   Message from Owens Shark, NP sent at 10/30/2021  4:16 PM EDT ----- Please let him know he has iron deficiency.  Begin ferrous sulfate 325 mg twice daily.  Return stool cards.

## 2021-11-01 LAB — BPAM RBC
Blood Product Expiration Date: 202307152359
Blood Product Expiration Date: 202307162359
ISSUE DATE / TIME: 202306290825
ISSUE DATE / TIME: 202306290825
Unit Type and Rh: 6200
Unit Type and Rh: 6200

## 2021-11-01 LAB — TYPE AND SCREEN
ABO/RH(D): A POS
Antibody Screen: NEGATIVE
Unit division: 0
Unit division: 0

## 2021-11-06 ENCOUNTER — Other Ambulatory Visit: Payer: Self-pay

## 2021-11-06 NOTE — Progress Notes (Signed)
The proposed treatment discussed in conference is for discussion purpose only and is not a binding recommendation.  The patients have not been physically examined, or presented with their treatment options.  Therefore, final treatment plans cannot be decided.  

## 2021-11-08 ENCOUNTER — Telehealth: Payer: Self-pay | Admitting: *Deleted

## 2021-11-08 NOTE — Telephone Encounter (Signed)
Per tumor board discussion: He needs a colonoscopy to evaluate R. LQ small bowel thickening. His GI physician is Dr. Fuller Plan. Louis [name] notified and agrees to see Dr. Fuller Plan. Sent message to Dr. Lynne Leader nurse to address with him.

## 2021-11-11 ENCOUNTER — Telehealth: Payer: Self-pay

## 2021-11-11 DIAGNOSIS — D509 Iron deficiency anemia, unspecified: Secondary | ICD-10-CM

## 2021-11-11 NOTE — Telephone Encounter (Signed)
Ladene Artist, MD  Carl Best, RN  Donita Brooks,   Please schedule colonoscopy and EGD for IDA and abnormal CT/PET of distal small bowel. He will need a 2 day bowel prep and CBC 1 week prior to procedures.   Thanks,   MS

## 2021-11-12 NOTE — Telephone Encounter (Signed)
Left message for patient to call back  

## 2021-11-13 NOTE — Telephone Encounter (Signed)
Patient has been scheduled for endo colon on 12/10/21 at 2:00 pm in the [facility] with Dr. Fuller Plan. PV scheduled for 11/20/21 at 10:30 am. He is not on any blood thinners or diabetic. Per Dr. Fuller Plan he will need a 2 day bowel prep & CBC 1 week prior (order already placed). Patient is aware of appointment dates & advised to call back with any questions.

## 2021-11-15 ENCOUNTER — Other Ambulatory Visit (HOSPITAL_BASED_OUTPATIENT_CLINIC_OR_DEPARTMENT_OTHER): Payer: Self-pay

## 2021-11-15 DIAGNOSIS — D649 Anemia, unspecified: Secondary | ICD-10-CM

## 2021-11-18 ENCOUNTER — Other Ambulatory Visit: Payer: Self-pay | Admitting: *Deleted

## 2021-11-18 ENCOUNTER — Other Ambulatory Visit: Payer: Self-pay | Admitting: Oncology

## 2021-11-18 DIAGNOSIS — C186 Malignant neoplasm of descending colon: Secondary | ICD-10-CM | POA: Diagnosis not present

## 2021-11-18 DIAGNOSIS — C189 Malignant neoplasm of colon, unspecified: Secondary | ICD-10-CM

## 2021-11-18 DIAGNOSIS — I708 Atherosclerosis of other arteries: Secondary | ICD-10-CM | POA: Diagnosis not present

## 2021-11-18 DIAGNOSIS — C799 Secondary malignant neoplasm of unspecified site: Secondary | ICD-10-CM | POA: Diagnosis not present

## 2021-11-18 DIAGNOSIS — I700 Atherosclerosis of aorta: Secondary | ICD-10-CM | POA: Diagnosis not present

## 2021-11-18 NOTE — Progress Notes (Signed)
Per Dr Barry Dienes for pre op evaluation orders added for EKG and CEA at next clinic appointment

## 2021-11-20 ENCOUNTER — Ambulatory Visit: Payer: Medicare Other

## 2021-11-20 ENCOUNTER — Other Ambulatory Visit: Payer: Medicare Other

## 2021-11-20 ENCOUNTER — Telehealth: Payer: Self-pay | Admitting: *Deleted

## 2021-11-20 ENCOUNTER — Ambulatory Visit: Payer: Medicare Other | Admitting: Oncology

## 2021-11-20 ENCOUNTER — Ambulatory Visit (AMBULATORY_SURGERY_CENTER): Payer: Medicare Other | Admitting: *Deleted

## 2021-11-20 VITALS — Ht 76.0 in | Wt 237.0 lb

## 2021-11-20 DIAGNOSIS — Z86010 Personal history of colonic polyps: Secondary | ICD-10-CM

## 2021-11-20 DIAGNOSIS — D508 Other iron deficiency anemias: Secondary | ICD-10-CM

## 2021-11-20 DIAGNOSIS — Z85038 Personal history of other malignant neoplasm of large intestine: Secondary | ICD-10-CM

## 2021-11-20 MED ORDER — NA SULFATE-K SULFATE-MG SULF 17.5-3.13-1.6 GM/177ML PO SOLN: 1.0000 | Freq: Once | ORAL | 0 refills | Status: AC

## 2021-11-20 NOTE — Telephone Encounter (Signed)
Spoke with patient regarding concerns for EGD. He stated this is the first he's heard about needing an EGD & doesn't understand the reasoning. I've advised him that we received a message from Dr. Gearldine Shown office in regards to his new onset of IDA, which indicates a need for an EGD and since he needed a colonoscopy that it's best to do both procedures together given both time and safety reasons. Patient verbalized all understanding, and stated he would prefer to discuss this with Dr. Learta Codding first and then will call our office back. Office number provided to patient.

## 2021-11-20 NOTE — Telephone Encounter (Signed)
Please call the patient to explain EGD indication of IDA. If no source of IDA on colonoscopy EGD would be recommended. If he only wants to do the colonoscopy then he can decline the EGD.

## 2021-11-20 NOTE — Progress Notes (Signed)
No egg or soy allergy known to patient  No issues known to pt with past sedation with any surgeries or procedures Patient denies ever being told they had issues or difficulty with intubation  No FH of Malignant Hyperthermia Pt is not on diet pills Pt is not on home 02  Pt is not on blood thinners  Pt denies issues with constipation  No A fib or A flutter Have any cardiac testing pending--NO Pt instructed to use Singlecare.com or GoodRx for a price reduction on prep   

## 2021-11-20 NOTE — Telephone Encounter (Signed)
Dr. Fuller Plan pt. Came in for pre-visit today, he is scheduled for ENDO/COLON on 12/10/21,he is requesting a call from your office to discuss having the ENDO,he would not sign for EGD wanted to talk to a Dr. Brantley Stage.

## 2021-11-23 ENCOUNTER — Other Ambulatory Visit: Payer: Self-pay | Admitting: Oncology

## 2021-11-25 ENCOUNTER — Other Ambulatory Visit: Payer: Self-pay

## 2021-11-26 ENCOUNTER — Telehealth: Payer: Self-pay

## 2021-11-26 NOTE — Telephone Encounter (Signed)
TC from pt inquiring about test scheduled with gastroenterologist. Pt states he is scheduled for endoscopy and colonoscopy. Pt is questioning if this was scheduled by Dr Benay Spice. Discussed with Dr Benay Spice who stated he ordered a colonoscopy but if Dr.Stark wants to do the EGD he is ok with that. This was relayed to the Pt who stated he is declining the EGD.

## 2021-11-27 ENCOUNTER — Other Ambulatory Visit: Payer: Self-pay | Admitting: *Deleted

## 2021-11-27 ENCOUNTER — Inpatient Hospital Stay: Payer: Medicare Other | Attending: Oncology

## 2021-11-27 ENCOUNTER — Encounter: Payer: Self-pay | Admitting: *Deleted

## 2021-11-27 ENCOUNTER — Telehealth: Payer: Self-pay

## 2021-11-27 ENCOUNTER — Inpatient Hospital Stay (HOSPITAL_BASED_OUTPATIENT_CLINIC_OR_DEPARTMENT_OTHER): Payer: Medicare Other | Admitting: Oncology

## 2021-11-27 ENCOUNTER — Inpatient Hospital Stay: Payer: Medicare Other

## 2021-11-27 VITALS — BP 142/68 | HR 52

## 2021-11-27 VITALS — BP 140/71 | HR 72 | Temp 98.1°F | Resp 18 | Ht 76.0 in | Wt 240.2 lb

## 2021-11-27 DIAGNOSIS — C189 Malignant neoplasm of colon, unspecified: Secondary | ICD-10-CM

## 2021-11-27 DIAGNOSIS — C187 Malignant neoplasm of sigmoid colon: Secondary | ICD-10-CM | POA: Insufficient documentation

## 2021-11-27 DIAGNOSIS — Z5111 Encounter for antineoplastic chemotherapy: Secondary | ICD-10-CM | POA: Insufficient documentation

## 2021-11-27 DIAGNOSIS — D649 Anemia, unspecified: Secondary | ICD-10-CM | POA: Insufficient documentation

## 2021-11-27 LAB — CMP (CANCER CENTER ONLY)
ALT: 7 U/L (ref 0–44)
AST: 9 U/L — ABNORMAL LOW (ref 15–41)
Albumin: 4.1 g/dL (ref 3.5–5.0)
Alkaline Phosphatase: 76 U/L (ref 38–126)
Anion gap: 9 (ref 5–15)
BUN: 37 mg/dL — ABNORMAL HIGH (ref 8–23)
CO2: 22 mmol/L (ref 22–32)
Calcium: 9.1 mg/dL (ref 8.9–10.3)
Chloride: 110 mmol/L (ref 98–111)
Creatinine: 2.34 mg/dL — ABNORMAL HIGH (ref 0.61–1.24)
GFR, Estimated: 28 mL/min — ABNORMAL LOW (ref >60)
Glucose, Bld: 151 mg/dL — ABNORMAL HIGH (ref 70–99)
Potassium: 4.2 mmol/L (ref 3.5–5.1)
Sodium: 141 mmol/L (ref 135–145)
Total Bilirubin: 0.4 mg/dL (ref 0.3–1.2)
Total Protein: 6.4 g/dL — ABNORMAL LOW (ref 6.5–8.1)

## 2021-11-27 LAB — CBC WITH DIFFERENTIAL (CANCER CENTER ONLY)
Abs Immature Granulocytes: 0.01 10*3/uL (ref 0.00–0.07)
Basophils Absolute: 0 10*3/uL (ref 0.0–0.1)
Basophils Relative: 1 %
Eosinophils Absolute: 0.2 10*3/uL (ref 0.0–0.5)
Eosinophils Relative: 5 %
HCT: 32.3 % — ABNORMAL LOW (ref 39.0–52.0)
Hemoglobin: 10.3 g/dL — ABNORMAL LOW (ref 13.0–17.0)
Immature Granulocytes: 0 %
Lymphocytes Relative: 20 %
Lymphs Abs: 0.9 10*3/uL (ref 0.7–4.0)
MCH: 28.4 pg (ref 26.0–34.0)
MCHC: 31.9 g/dL (ref 30.0–36.0)
MCV: 89 fL (ref 80.0–100.0)
Monocytes Absolute: 0.4 10*3/uL (ref 0.1–1.0)
Monocytes Relative: 9 %
Neutro Abs: 3 10*3/uL (ref 1.7–7.7)
Neutrophils Relative %: 65 %
Platelet Count: 138 10*3/uL — ABNORMAL LOW (ref 150–400)
RBC: 3.63 MIL/uL — ABNORMAL LOW (ref 4.22–5.81)
RDW: 21.4 % — ABNORMAL HIGH (ref 11.5–15.5)
WBC Count: 4.5 10*3/uL (ref 4.0–10.5)
nRBC: 0 % (ref 0.0–0.2)

## 2021-11-27 LAB — FERRITIN: Ferritin: 21 ng/mL — ABNORMAL LOW (ref 24–336)

## 2021-11-27 LAB — CEA (ACCESS): CEA (CHCC): 3.29 ng/mL (ref 0.00–5.00)

## 2021-11-27 MED ORDER — SODIUM CHLORIDE 0.9 % IV SOLN
1800.0000 mg/m2 | INTRAVENOUS | Status: DC
  Administered 2021-11-27: 4350 mg via INTRAVENOUS
  Filled 2021-11-27: qty 87

## 2021-11-27 MED ORDER — FLUOROURACIL CHEMO INJECTION 2.5 GM/50ML
300.0000 mg/m2 | Freq: Once | INTRAVENOUS | Status: AC
  Administered 2021-11-27: 750 mg via INTRAVENOUS
  Filled 2021-11-27: qty 15

## 2021-11-27 MED ORDER — SODIUM CHLORIDE 0.9 % IV SOLN: Freq: Once | INTRAVENOUS | Status: AC

## 2021-11-27 MED ORDER — SODIUM CHLORIDE 0.9 % IV SOLN
300.0000 mg/m2 | Freq: Once | INTRAVENOUS | Status: AC
  Administered 2021-11-27: 726 mg via INTRAVENOUS
  Filled 2021-11-27: qty 36.3

## 2021-11-27 NOTE — Telephone Encounter (Signed)
Patient gave verbal understanding and had no further questions or concerns  

## 2021-11-27 NOTE — Progress Notes (Signed)
Faxed copy of EKG to Dr. Jerilynn Mages. Fuller Plan at [facility] [phone].

## 2021-11-27 NOTE — Progress Notes (Signed)
[facility] OFFICE PROGRESS NOTE   Diagnosis: Colon cancer  INTERVAL HISTORY:   Louis [name] completed another cycle of infusional 5-fluorouracil 10/29/2021.  He reports mild nausea following chemotherapy.  He had mild diarrhea for 2 days. He received a Red cell transfusion 10/31/2021.  He reports improvement in his energy level following the Red cell transfusion.  No bleeding.  He is taking iron. He saw Dr. Barry Dienes.  He is scheduled for colonoscopy next week.  Dr Barry Dienes will plan surgery based on the colonoscopy findings.  Objective:  Vital signs in last 24 hours:  Blood pressure 140/71, pulse 72, temperature 98.1 F (36.7 C), temperature source Oral, resp. rate 18, height $RemoveBe'6\' 4"'UcbvxkSRD$  (1.93 m), weight 240 lb 3.2 oz (109 kg), SpO2 98 %.    HEENT: No thrush or ulcers Lymphatics: 2-3 cm mobile left axillary node, 4 cm left inguinal node with an overlying 1.5 cm cutaneous nodule Resp: Lungs clear bilaterally Cardio: Regular rate and rhythm GI: Nontender, no mass, no hepatosplenomegaly Vascular: No leg edema  Portacath/PICC-without erythema  Lab Results:  Lab Results  Component Value Date   WBC 4.5 11/27/2021   HGB 10.3 (L) 11/27/2021   HCT 32.3 (L) 11/27/2021   MCV 89.0 11/27/2021   PLT 138 (L) 11/27/2021   NEUTROABS 3.0 11/27/2021    CMP  Lab Results  Component Value Date   NA 141 11/27/2021   K 4.2 11/27/2021   CL 110 11/27/2021   CO2 22 11/27/2021   GLUCOSE 151 (H) 11/27/2021   BUN 37 (H) 11/27/2021   CREATININE 2.34 (H) 11/27/2021   CALCIUM 9.1 11/27/2021   PROT 6.4 (L) 11/27/2021   ALBUMIN 4.1 11/27/2021   AST 9 (L) 11/27/2021   ALT 7 11/27/2021   ALKPHOS 76 11/27/2021   BILITOT 0.4 11/27/2021   GFRNONAA 28 (L) 11/27/2021   GFRAA 28 (L) 09/20/2019    Lab Results  Component Value Date   CEA1 2.61 01/02/2021   CEA 3.29 11/27/2021    Lab Results  Component Value Date   INR 0.9 10/11/2020   LABPROT 12.6 10/11/2020    Imaging:  No results  found.  Medications: I have reviewed the patient's current medications.   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), isolated mesenteric implant-resected, multiple tumor deposits Sigmoid/descending resection and creation of a descending colostomy 04/10/2016 MSI-stable, no loss of mismatch repair protein expression Foundation 1- BRAF V600E positive, MS-stable, intermediate tumor mutation burden, no RAS mutation CT abdomen/pelvis 04/02/2016-no evidence of distant metastatic disease Cycle 1 FOLFOX 05/21/2016 Cycle 2 FOLFOX 06/04/2016 Cycle 3 FOLFOX 06/18/2016 Cycle 4 FOLFOX 07/02/2016 (oxaliplatin held secondary to thrombocytopenia) Cycle 5 FOLFOX 07/16/2016 Cycle 6 FOLFOX 07/30/2016 Cycle 7 FOLFOX 08/13/2016 (oxaliplatin held and 5-FU dose reduced) Cycle 8 FOLFOX 09/03/2016 (oxaliplatin held secondary to neuropathy) Cycle 9 FOLFOX 09/17/2016 (oxaliplatin held secondary to neuropathy) Cycle 10 FOLFOX 10/02/2016 Cycle 11 FOLFOX 10/15/2016 (oxaliplatin eliminated from the regimen) Cycle 12 FOLFOX 10/30/2016 (oxaliplatin eliminated) CTs 05/12/2017-no evidence of recurrent disease, right inguinal hernia containing bladder Colonoscopy 07/21/2017, 3 polyps were removed from the descending and transverse colon, fragments of tubular and tubulovillous adenoma CTs 05/19/2018-no evidence of recurrent disease, status post hernia repair, right upper lobe pneumonia CTs 05/20/2019-resolution of right upper lobe pneumonia, no evidence of metastatic disease Colonoscopy 01/25/2020-multiple polyps removed-tubular adenomas, poor preparation, repeat colonoscopy recommended CTs neck, chest, and abdomen/pelvis 08/27/2020- new 2 centimeter left axillary node, new 1.9 cm left inguinal node chronic mildly prominent portacaval node Ultrasound biopsy of  left inguinal node on 10/29/2020-metastatic adenocarcinoma consistent with colon adenocarcinoma PET 5/42/4814-[id number] hypermetabolic left inguinal and  left axillary nodes, solitary focus of hypermetabolic activity in the right lower quadrant small bowel Cycle 1 FOLFOX 12/19/2020 Cycle 2 FOLFOX 01/02/2021, oxaliplatin dose reduced secondary to neutropenia and thrombocytopenia Cycle 3 FOLFOX 01/16/2021 Cycle 4 FOLFOX 01/30/2021 CTs 02/11/2021-no change in left inguinal and left axillary nodes, no evidence of disease progression, stable wall thickening involving a loop of distal ileum Cycle 1 FOLFIRI/Avastin 02/27/2021 Cycle 2 FOLFIRI/Avastin 03/13/2021 Cycle 3 FOLFIRI/Avastin 04/03/2021 Cycle 4 FOLFIRI 04/17/2021, Avastin held due to elevated urine protein 05/07/2021-24-hour urine protein elevated 1562 Cycle 5 FOLFIRI 05/08/2021, Avastin held due to elevated urine protein, irinotecan dose reduced due to in general not feeling well after treatment CTs 05/17/2021-left axillary and left inguinal lymph nodes are smaller, no evidence of disease progression Cycle 6 FOLFIRI 05/22/2021, Avastin held due to proteinuria 06/03/2021 24-hour urine with 1.9 g of protein Cycle 7 FOLFIRI 06/12/2021, Avastin held due to proteinuria Cycle 8 FOLFIRI 07/03/2021, Avastin held due to proteinuria Cycle 9 FOLFIRI 07/24/2021, Avastin held due to proteinuria Cycle 10 FOLFIRI 08/19/2021, Avastin held due to proteinuria CTs 09/02/2021-no change in the left axillary, left inguinal, and portacaval lymph nodes.  No mention of the distal small bowel thickening Maintenance 5-fluorouracil 09/18/2021 CT abdomen/pelvis 10/28/2021-enlarged left inguinal lymph node, stable; stomach and small bowel grossly unremarkable. Maintenance 5-fluorouracil continued   2.   Chronic renal insufficiency   3.   Hypertension   4.  Inflamed sebaceous cyst at the upper back-status post incision and drainage   5.  Thrombocytopenia secondary chemotherapy-oxaliplatin dose reduced beginning with cycle 3 FOLFOX, oxaliplatin held with cycle 4 and cycle 7 FOLFOX   6.  Oxaliplatin neuropathy-improved   7.  History of  Mucositis secondary chemotherapy   8.  Symptoms of pneumonia January 2020-CT 05/19/2018 consistent with right upper lobe pneumonia, Levaquin prescribed 05/20/2018   9.  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  Anemia, progressive 10/29/2021 2 units packed red blood cells 10/31/2021 Ferritin low 10/29/2021-oral iron    Disposition: Louis [name] appears unchanged.  The palpable lymphadenopathy appears stable.  The anemia improved following the Red cell transfusion and with iron therapy.  He will continue oral iron.  He is scheduled for colonoscopy next week.  He saw Dr. Barry Dienes and will be scheduled for resection of the left axillary and left inguinal masses.  He will also be scheduled for a bowel resection based on the colonoscopy and repeat PET findings. He may have a malignant right axillary lymph node.  I will ask Dr. Barry Dienes to assess the right axilla.  He will return for an office visit in 3 weeks.  Betsy Coder, MD  11/27/2021  11:54 AM

## 2021-11-27 NOTE — Progress Notes (Signed)
Patient seen by Dr. Benay Spice today  Vitals are within treatment parameters.  Labs reviewed by Dr. Benay Spice and are not all within treatment parameters. Creatinine 2.34  Per physician team, patient is ready for treatment and there are NO modifications to the treatment plan.

## 2021-11-27 NOTE — Patient Instructions (Signed)
[facility]    Discharge Instructions: Thank you for choosing [facility] to provide your oncology and hematology care.   If you have a lab appointment with the [facility], please go directly to the [facility] and check in at the registration area.   Wear comfortable clothing and clothing appropriate for easy access to any Portacath or PICC line.   We strive to give you quality time with your provider. You may need to reschedule your appointment if you arrive late (15 or more minutes).  Arriving late affects you and other patients whose appointments are after yours.  Also, if you miss three or more appointments without notifying the office, you may be dismissed from the clinic at the provider's discretion.      For prescription refill requests, have your pharmacy contact our office and allow 72 hours for refills to be completed.    Today you received the following chemotherapy and/or immunotherapy agents Leucovorin & Flourouracil (ADRUCIL).      To help prevent nausea and vomiting after your treatment, we encourage you to take your nausea medication as directed.  BELOW ARE SYMPTOMS THAT SHOULD BE REPORTED IMMEDIATELY: *FEVER GREATER THAN 100.4 F (38 C) OR HIGHER *CHILLS OR SWEATING *NAUSEA AND VOMITING THAT IS NOT CONTROLLED WITH YOUR NAUSEA MEDICATION *UNUSUAL SHORTNESS OF BREATH *UNUSUAL BRUISING OR BLEEDING *URINARY PROBLEMS (pain or burning when urinating, or frequent urination) *BOWEL PROBLEMS (unusual diarrhea, constipation, pain near the anus) TENDERNESS IN MOUTH AND THROAT WITH OR WITHOUT PRESENCE OF ULCERS (sore throat, sores in mouth, or a toothache) UNUSUAL RASH, SWELLING OR PAIN  UNUSUAL VAGINAL DISCHARGE OR ITCHING   Items with * indicate a potential emergency and should be followed up as soon as possible or go to the Emergency Department if any problems should occur.  Please show the CHEMOTHERAPY ALERT CARD or IMMUNOTHERAPY  ALERT CARD at check-in to the Emergency Department and triage nurse.  Should you have questions after your visit or need to cancel or reschedule your appointment, please contact [facility]  Dept: [phone]  and follow the prompts.  Office hours are 8:00 a.m. to 4:30 p.m. Monday - Friday. Please note that voicemails left after 4:00 p.m. may not be returned until the following business day.  We are closed weekends and major holidays. You have access to a nurse at all times for urgent questions. Please call the main number to the clinic Dept: [phone] and follow the prompts.   For any non-urgent questions, you may also contact your provider using MyChart. We now offer e-Visits for anyone 83 and older to request care online for non-urgent symptoms. For details visit [email or web address].   Also download the MyChart app! Go to the app store, search "MyChart", open the app, select [facility], and log in with your MyChart username and password.  Masks are optional in the cancer centers. If you would like for your care team to wear a mask while they are taking care of you, please let them know. For doctor visits, patients may have with them one support person who is at least 75 years old. At this time, visitors are not allowed in the infusion area.  Leucovorin injection What is this medication? LEUCOVORIN (loo koe VOR in) is used to prevent or treat the harmful effects of some medicines. This medicine is used to treat anemia caused by a low amount of folic acid in the body. It is also used  with 5-fluorouracil (5-FU) to treat colon cancer. This medicine may be used for other purposes; ask your health care provider or pharmacist if you have questions. What should I tell my care team before I take this medication? They need to know if you have any of these conditions: anemia from low levels of vitamin B-12 in the blood an unusual or allergic reaction to leucovorin, folic  acid, other medicines, foods, dyes, or preservatives pregnant or trying to get pregnant breast-feeding How should I use this medication? This medicine is for injection into a muscle or into a vein. It is given by a health care professional in a hospital or clinic setting. Talk to your pediatrician regarding the use of this medicine in children. Special care may be needed. Overdosage: If you think you have taken too much of this medicine contact a poison control center or emergency room at once. NOTE: This medicine is only for you. Do not share this medicine with others. What if I miss a dose? This does not apply. What may interact with this medication? capecitabine fluorouracil phenobarbital phenytoin primidone trimethoprim-sulfamethoxazole This list may not describe all possible interactions. Give your health care provider a list of all the medicines, herbs, non-prescription drugs, or dietary supplements you use. Also tell them if you smoke, drink alcohol, or use illegal drugs. Some items may interact with your medicine. What should I watch for while using this medication? Your condition will be monitored carefully while you are receiving this medicine. This medicine may increase the side effects of 5-fluorouracil, 5-FU. Tell your doctor or health care professional if you have diarrhea or mouth sores that do not get better or that get worse. What side effects may I notice from receiving this medication? Side effects that you should report to your doctor or health care professional as soon as possible: allergic reactions like skin rash, itching or hives, swelling of the face, lips, or tongue breathing problems fever, infection mouth sores unusual bleeding or bruising unusually weak or tired Side effects that usually do not require medical attention (report to your doctor or health care professional if they continue or are bothersome): constipation or diarrhea loss of appetite nausea,  vomiting This list may not describe all possible side effects. Call your doctor for medical advice about side effects. You may report side effects to FDA at 1-800-FDA-1088. Where should I keep my medication? This drug is given in a hospital or clinic and will not be stored at home. NOTE: This sheet is a summary. It may not cover all possible information. If you have questions about this medicine, talk to your doctor, pharmacist, or health care provider.  2023 Elsevier/Gold Standard (2007-10-28 00:00:00)  Fluorouracil, 5-FU injection What is this medication? FLUOROURACIL, 5-FU (flure oh YOOR a sil) is a chemotherapy drug. It slows the growth of cancer cells. This medicine is used to treat many types of cancer like breast cancer, colon or rectal cancer, pancreatic cancer, and stomach cancer. This medicine may be used for other purposes; ask your health care provider or pharmacist if you have questions. COMMON BRAND NAME(S): Adrucil What should I tell my care team before I take this medication? They need to know if you have any of these conditions: blood disorders dihydropyrimidine dehydrogenase (DPD) deficiency infection (especially a virus infection such as chickenpox, cold sores, or herpes) kidney disease liver disease malnourished, poor nutrition recent or ongoing radiation therapy an unusual or allergic reaction to fluorouracil, other chemotherapy, other medicines, foods, dyes, or preservatives  pregnant or trying to get pregnant breast-feeding How should I use this medication? This drug is given as an infusion or injection into a vein. It is administered in a hospital or clinic by a specially trained health care professional. Talk to your pediatrician regarding the use of this medicine in children. Special care may be needed. Overdosage: If you think you have taken too much of this medicine contact a poison control center or emergency room at once. NOTE: This medicine is only for you.  Do not share this medicine with others. What if I miss a dose? It is important not to miss your dose. Call your doctor or health care professional if you are unable to keep an appointment. What may interact with this medication? Do not take this medicine with any of the following medications: live virus vaccines This medicine may also interact with the following medications: medicines that treat or prevent blood clots like warfarin, enoxaparin, and dalteparin This list may not describe all possible interactions. Give your health care provider a list of all the medicines, herbs, non-prescription drugs, or dietary supplements you use. Also tell them if you smoke, drink alcohol, or use illegal drugs. Some items may interact with your medicine. What should I watch for while using this medication? Visit your doctor for checks on your progress. This drug may make you feel generally unwell. This is not uncommon, as chemotherapy can affect healthy cells as well as cancer cells. Report any side effects. Continue your course of treatment even though you feel ill unless your doctor tells you to stop. In some cases, you may be given additional medicines to help with side effects. Follow all directions for their use. Call your doctor or health care professional for advice if you get a fever, chills or sore throat, or other symptoms of a cold or flu. Do not treat yourself. This drug decreases your body's ability to fight infections. Try to avoid being around people who are sick. This medicine may increase your risk to bruise or bleed. Call your doctor or health care professional if you notice any unusual bleeding. Be careful brushing and flossing your teeth or using a toothpick because you may get an infection or bleed more easily. If you have any dental work done, tell your dentist you are receiving this medicine. Avoid taking products that contain aspirin, acetaminophen, ibuprofen, naproxen, or ketoprofen unless  instructed by your doctor. These medicines may hide a fever. Do not become pregnant while taking this medicine. Women should inform their doctor if they wish to become pregnant or think they might be pregnant. There is a potential for serious side effects to an unborn child. Talk to your health care professional or pharmacist for more information. Do not breast-feed an infant while taking this medicine. Men should inform their doctor if they wish to father a child. This medicine may lower sperm counts. Do not treat diarrhea with over the counter products. Contact your doctor if you have diarrhea that lasts more than 2 days or if it is severe and watery. This medicine can make you more sensitive to the sun. Keep out of the sun. If you cannot avoid being in the sun, wear protective clothing and use sunscreen. Do not use sun lamps or tanning beds/booths. What side effects may I notice from receiving this medication? Side effects that you should report to your doctor or health care professional as soon as possible: allergic reactions like skin rash, itching or hives, swelling of the  face, lips, or tongue low blood counts - this medicine may decrease the number of white blood cells, red blood cells and platelets. You may be at increased risk for infections and bleeding. signs of infection - fever or chills, cough, sore throat, pain or difficulty passing urine signs of decreased platelets or bleeding - bruising, pinpoint red spots on the skin, black, tarry stools, blood in the urine signs of decreased red blood cells - unusually weak or tired, fainting spells, lightheadedness breathing problems changes in vision chest pain mouth sores nausea and vomiting pain, swelling, redness at site where injected pain, tingling, numbness in the hands or feet redness, swelling, or sores on hands or feet stomach pain unusual bleeding Side effects that usually do not require medical attention (report to your doctor  or health care professional if they continue or are bothersome): changes in finger or toe nails diarrhea dry or itchy skin hair loss headache loss of appetite sensitivity of eyes to the light stomach upset unusually teary eyes This list may not describe all possible side effects. Call your doctor for medical advice about side effects. You may report side effects to FDA at 1-800-FDA-1088. Where should I keep my medication? This drug is given in a hospital or clinic and will not be stored at home. NOTE: This sheet is a summary. It may not cover all possible information. If you have questions about this medicine, talk to your doctor, pharmacist, or health care provider.  2023 Elsevier/Gold Standard (2021-03-22 00:00:00)  The chemotherapy medication bag should finish at 46 hours, 96 hours, or 7 days. For example, if your pump is scheduled for 46 hours and it was put on at 4:00 p.m., it should finish at 2:00 p.m. the day it is scheduled to come off regardless of your appointment time.     Estimated time to finish at 11:30 a.m. on Friday 11/29/2021.   If the display on your pump reads "Low Volume" and it is beeping, take the batteries out of the pump and come to the cancer center for it to be taken off.   If the pump alarms go off prior to the pump reading "Low Volume" then call [phone] and someone can assist you.  If the plunger comes out and the chemotherapy medication is leaking out, please use your home chemo spill kit to clean up the spill. Do NOT use paper towels or other household products.  If you have problems or questions regarding your pump, please call either [phone] (24 hours a day) or the cancer center Monday-Friday 8:00 a.m.- 4:30 p.m. at the clinic number and we will assist you. If you are unable to get assistance, then go to the nearest Emergency Department and ask the staff to contact the IV team for assistance.

## 2021-11-27 NOTE — Telephone Encounter (Signed)
-----   Message from Ladell Pier, MD sent at 11/27/2021  2:50 PM EDT ----- Please call patient, the iron level is still low, but better, continue iron, repeat ferritin next visit

## 2021-11-28 ENCOUNTER — Encounter: Payer: Self-pay | Admitting: *Deleted

## 2021-11-28 ENCOUNTER — Other Ambulatory Visit: Payer: Self-pay

## 2021-11-29 ENCOUNTER — Inpatient Hospital Stay: Payer: Medicare Other

## 2021-11-29 VITALS — BP 138/70 | HR 65 | Temp 98.1°F | Resp 18

## 2021-11-29 DIAGNOSIS — D649 Anemia, unspecified: Secondary | ICD-10-CM | POA: Diagnosis not present

## 2021-11-29 DIAGNOSIS — C187 Malignant neoplasm of sigmoid colon: Secondary | ICD-10-CM | POA: Diagnosis not present

## 2021-11-29 DIAGNOSIS — C189 Malignant neoplasm of colon, unspecified: Secondary | ICD-10-CM

## 2021-11-29 DIAGNOSIS — Z5111 Encounter for antineoplastic chemotherapy: Secondary | ICD-10-CM | POA: Diagnosis not present

## 2021-11-29 MED ORDER — HEPARIN SOD (PORK) LOCK FLUSH 100 UNIT/ML IV SOLN
500.0000 [IU] | Freq: Once | INTRAVENOUS | Status: AC | PRN
  Administered 2021-11-29: 500 [IU]

## 2021-11-29 MED ORDER — SODIUM CHLORIDE 0.9% FLUSH
10.0000 mL | INTRAVENOUS | Status: DC | PRN
  Administered 2021-11-29: 10 mL

## 2021-11-29 NOTE — Patient Instructions (Signed)

## 2021-11-30 ENCOUNTER — Other Ambulatory Visit: Payer: Self-pay

## 2021-12-04 ENCOUNTER — Other Ambulatory Visit: Payer: Self-pay | Admitting: Oncology

## 2021-12-04 DIAGNOSIS — C189 Malignant neoplasm of colon, unspecified: Secondary | ICD-10-CM

## 2021-12-05 ENCOUNTER — Telehealth: Payer: Self-pay | Admitting: *Deleted

## 2021-12-05 NOTE — Telephone Encounter (Signed)
Called Louis [name] to inform him that Dr. Benay Spice ordered the PET scan Dr. Barry Dienes wanted done. He was aware and informed nurse it was already scheduled for 8/16. Notified managed care to complete PA.

## 2021-12-10 ENCOUNTER — Ambulatory Visit (AMBULATORY_SURGERY_CENTER): Payer: Medicare Other | Admitting: Gastroenterology

## 2021-12-10 ENCOUNTER — Encounter: Payer: Self-pay | Admitting: Gastroenterology

## 2021-12-10 VITALS — BP 116/77 | HR 63 | Temp 98.3°F | Resp 14 | Ht 76.0 in | Wt 237.0 lb

## 2021-12-10 DIAGNOSIS — K6389 Other specified diseases of intestine: Secondary | ICD-10-CM | POA: Diagnosis not present

## 2021-12-10 DIAGNOSIS — R948 Abnormal results of function studies of other organs and systems: Secondary | ICD-10-CM | POA: Diagnosis not present

## 2021-12-10 DIAGNOSIS — D509 Iron deficiency anemia, unspecified: Secondary | ICD-10-CM

## 2021-12-10 DIAGNOSIS — C172 Malignant neoplasm of ileum: Secondary | ICD-10-CM

## 2021-12-10 DIAGNOSIS — D123 Benign neoplasm of transverse colon: Secondary | ICD-10-CM

## 2021-12-10 DIAGNOSIS — Z85038 Personal history of other malignant neoplasm of large intestine: Secondary | ICD-10-CM

## 2021-12-10 DIAGNOSIS — D120 Benign neoplasm of cecum: Secondary | ICD-10-CM

## 2021-12-10 MED ORDER — SODIUM CHLORIDE 0.9 % IV SOLN: 500.0000 mL | Freq: Once | INTRAVENOUS | Status: DC

## 2021-12-10 NOTE — Op Note (Signed)
[facility] Patient Name: Louis [name] Procedure Date: 12/10/2021 2:03 PM MRN: [id number] Endoscopist: Ladene Artist , MD Age: 76 Referring MD:  Date of Birth: 03-28-1946 Gender: Male Account #: [id number] Procedure:                Colonoscopy Indications:              Abnormal PET scan of the GI tract (distal small                            bowel), Personal history of colon cancer, Iron                            deficiency anemia Medicines:                Monitored Anesthesia Care Procedure:                Pre-Anesthesia Assessment:                           - Prior to the procedure, a History and Physical                            was performed, and patient medications and                            allergies were reviewed. The patient's tolerance of                            previous anesthesia was also reviewed. The risks                            and benefits of the procedure and the sedation                            options and risks were discussed with the patient.                            All questions were answered, and informed consent                            was obtained. Prior Anticoagulants: The patient has                            taken no previous anticoagulant or antiplatelet                            agents. ASA Grade Assessment: II - A patient with                            mild systemic disease. After reviewing the risks                            and benefits, the patient was deemed in  satisfactory condition to undergo the procedure.                           After obtaining informed consent, the colonoscope                            was passed under direct vision. Throughout the                            procedure, the patient's blood pressure, pulse, and                            oxygen saturations were monitored continuously. The                            CF [id number] #[id number] was introduced through the  anus                            and advanced to the the terminal ileum, with                            identification of the appendiceal orifice and IC                            valve. The ileocecal valve, appendiceal orifice,                            and rectum were photographed. The quality of the                            bowel preparation was adequate after substantial                            lavage, suction. The colonoscopy was performed                            without difficulty. The patient tolerated the                            procedure well. Scope In: 2:09:06 PM Scope Out: 2:38:44 PM Scope Withdrawal Time: 0 hours 25 minutes 44 seconds  Total Procedure Duration: 0 hours 29 minutes 38 seconds  Findings:                 The perianal and digital rectal examinations were                            normal.                           The terminal ileum contained an ulcerated partially                            obstructing medium-sized mass located approximately  15-20 cm from the IC valve. The mass was partially                            circumferential (involving two-thirds of the lumen                            circumference). The mass measured 2 cm in length.                            In addition, its diameter measured 2 cm. No                            bleeding was present. No stigmata of recent                            bleeding were seen. Was able to traverse the                            lesion. Biopsies were taken with a cold forceps for                            histology.                           The terminal ileum otherwise appeared normal. Two                            areas were tattooed with an injection of 2 mL of                            Spot (carbon black) ~ 5 cm distal to the tumor.                           Two sessile polyps were found in the transverse                            colon and cecum. The polyps were 5  to 6 mm in size.                            These polyps were removed with a cold snare.                            Resection and retrieval were complete.                           There was evidence of a prior end-to-side                            colo-colonic anastomosis in the descending colon.                            This was patent and was characterized by healthy  appearing mucosa. The anastomosis was traversed.                           The exam was otherwise without abnormality on                            direct and retroflexion views. Complications:            No immediate complications. Estimated blood loss:                            None. Estimated Blood Loss:     Estimated blood loss: none. 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 Biopsied.                           - The examined portion of the distal ileum was                            otherwise normal. Tattooed ~ 5 cm distal to the                            tumor.                           - Two 5 to 6 mm polyps in the transverse colon and                            in the cecum, removed with a cold snare. Resected                            and retrieved.                           - Patent end-to-side colo-colonic anastomosis,                            characterized by healthy appearing mucosa.                           - The examination was otherwise normal on direct                            and retroflexion views. Recommendation:           - Repeat colonoscopy, likely 3 years, after studies                            are complete for surveillance based on pathology                            results with an extended bowel prep.                           -  Patient has a contact number available for                            emergencies. The signs and symptoms of potential                             delayed complications were discussed with the                            patient. Return to normal activities tomorrow.                            Written discharge instructions were provided to the                            patient.                           - Resume previous diet.                           - Continue present medications.                           - Await pathology results.                           - Follow up with Dr. Learta Codding and Dr. Barry Dienes as                            planned. Ladene Artist, MD 12/10/2021 2:50:55 PM This report has been signed electronically.

## 2021-12-10 NOTE — Progress Notes (Signed)
Called to room to assist during endoscopic procedure.  Patient ID and intended procedure confirmed with present staff. Received instructions for my participation in the procedure from the performing physician.  

## 2021-12-10 NOTE — Progress Notes (Signed)
Sedate, gd SR, tolerated procedure well, VSS, report to RN 

## 2021-12-10 NOTE — Progress Notes (Signed)
See 11/27/2021 H&P, no changes

## 2021-12-10 NOTE — Patient Instructions (Signed)
  HANDOUTS ON POLYPS GIVEN.  FOLLOW UP WITH DR Learta Codding AND DR BYERLY AS PLANNED.    YOU HAD AN ENDOSCOPIC PROCEDURE TODAY AT [facility]:   Refer to the procedure report that was given to you for any specific questions about what was found during the examination.  If the procedure report does not answer your questions, please call your gastroenterologist to clarify.  If you requested that your care partner not be given the details of your procedure findings, then the procedure report has been included in a sealed envelope for you to review at your convenience later.  YOU SHOULD EXPECT: Some feelings of bloating in the abdomen. Passage of more gas than usual.  Walking can help get rid of the air that was put into your GI tract during the procedure and reduce the bloating. If you had a lower endoscopy (such as a colonoscopy or flexible sigmoidoscopy) you may notice spotting of blood in your stool or on the toilet paper. If you underwent a bowel prep for your procedure, you may not have a normal bowel movement for a few days.  Please Note:  You might notice some irritation and congestion in your nose or some drainage.  This is from the oxygen used during your procedure.  There is no need for concern and it should clear up in a day or so.  SYMPTOMS TO REPORT IMMEDIATELY:  Following lower endoscopy (colonoscopy or flexible sigmoidoscopy):  Excessive amounts of blood in the stool  Significant tenderness or worsening of abdominal pains  Swelling of the abdomen that is new, acute  Fever of 100F or higher   For urgent or emergent issues, a gastroenterologist can be reached at any hour by calling [phone]. Do not use MyChart messaging for urgent concerns.    DIET:  We do recommend a small meal at first, but then you may proceed to your regular diet.  Drink plenty of fluids but you should avoid alcoholic beverages for 24 hours.  ACTIVITY:  You should plan to take it easy for  the rest of today and you should NOT DRIVE or use heavy machinery until tomorrow (because of the sedation medicines used during the test).    FOLLOW UP: Our staff will call the number listed on your records the next business day following your procedure.  We will call around 7:15- 8:00 am to check on you and address any questions or concerns that you may have regarding the information given to you following your procedure. If we do not reach you, we will leave a message.  If you develop any symptoms (ie: fever, flu-like symptoms, shortness of breath, cough etc.) before then, please call [phone].  If you test positive for Covid 19 in the 2 weeks post procedure, please call and report this information to [place].    If any biopsies were taken you will be contacted by phone or by letter within the next 1-3 weeks.  Please call us at [phone] if you have not heard about the biopsies in 3 weeks.    SIGNATURES/CONFIDENTIALITY: You and/or your care partner have signed paperwork which will be entered into your electronic medical record.  These signatures attest to the fact that that the information above on your After Visit Summary has been reviewed and is understood.  Full responsibility of the confidentiality of this discharge information lies with you and/or your care-partner.

## 2021-12-10 NOTE — Progress Notes (Signed)
VS completed by CW.   Pt's states no medical or surgical changes since previsit or office visit.  

## 2021-12-11 ENCOUNTER — Telehealth: Payer: Self-pay | Admitting: *Deleted

## 2021-12-11 NOTE — Telephone Encounter (Signed)
  Follow up Call-     12/10/2021    1:24 PM 01/25/2020   10:48 AM  Call back number  Post procedure Call Back phone  # [phone]  Permission to leave phone message Yes Yes     Patient questions:  Do you have a fever, pain , or abdominal swelling? No. Pain Score  0 *  Have you tolerated food without any problems? Yes.    Have you been able to return to your normal activities? Yes.    Do you have any questions about your discharge instructions: Diet   No. Medications  No. Follow up visit  No.  Do you have questions or concerns about your Care? No.  Actions: * If pain score is 4 or above: No action needed, pain <4.

## 2021-12-12 ENCOUNTER — Encounter: Payer: Self-pay | Admitting: Oncology

## 2021-12-14 ENCOUNTER — Other Ambulatory Visit: Payer: Self-pay | Admitting: Oncology

## 2021-12-14 DIAGNOSIS — C189 Malignant neoplasm of colon, unspecified: Secondary | ICD-10-CM

## 2021-12-14 NOTE — Progress Notes (Signed)
OFF PATHWAY REGIMEN - Colorectal  No Change  Continue With Treatment as Ordered.  Original Decision Date/Time: 02/13/2021 10:55   [id number] + Bevacizumab (Leucovorin IV D1 + Fluorouracil IV D1/CIV D1,2 + Irinotecan IV D1 + Bevacizumab IV D1) q14 Days:   A cycle is every 14 days:     Bevacizumab-xxxx      Irinotecan      Leucovorin      Fluorouracil      Fluorouracil   **Always confirm dose/schedule in your pharmacy ordering system**  Patient Characteristics: Distant Metastases, Nonsurgical Candidate, BRAF V600 Mutation Positive (KRAS/NRAS Wild-Type), Standard Cytotoxic/Targeted Therapy, Second Line Standard Cytotoxic/Targeted Therapy Tumor Location: Colon Therapeutic Status: Distant Metastases Microsatellite/Mismatch Repair Status: MSS/pMMR BRAF Mutation Status: Mutation Positive KRAS/NRAS Mutation Status: Wild-Type (no mutation) Standard Cytotoxic/Targeted Line of Therapy: Second Line Standard Cytotoxic/Targeted Therapy Intent of Therapy: Non-Curative / Palliative Intent, Discussed with Patient

## 2021-12-16 ENCOUNTER — Encounter: Payer: Self-pay | Admitting: Oncology

## 2021-12-16 ENCOUNTER — Telehealth: Payer: Self-pay

## 2021-12-16 NOTE — Telephone Encounter (Signed)
Patient's pathology from recent colon on 12/10/21 has resulted. Per Dr. Fuller Plan, while he is out of office he's asked for "DOD to review and contact the patient. He is followed by Dr. Benay Spice and Dr. Barry Dienes." Will route to DOD.

## 2021-12-16 NOTE — Telephone Encounter (Signed)
Pathology results reviewed.  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  Separately, a few small tubular adenomas resected from the colon.  We discussed these results at length today.  He is scheduled for PET scan later this week and has an appointment with Dr. Barry Dienes towards the end of the month.  I told him I would forward results to Dr. Benay Spice and Dr. Barry Dienes as well and let Dr. Fuller Plan know that we discussed the test results.  All questions answered and appreciative of phone call.

## 2021-12-18 ENCOUNTER — Encounter (HOSPITAL_COMMUNITY)
Admission: RE | Admit: 2021-12-18 | Discharge: 2021-12-18 | Disposition: A | Discharge status: Home / Self Care | Payer: Medicare Other | Source: Ambulatory Visit | Attending: Oncology | Admitting: Oncology

## 2021-12-18 ENCOUNTER — Other Ambulatory Visit: Payer: Medicare Other

## 2021-12-18 ENCOUNTER — Ambulatory Visit: Payer: Medicare Other

## 2021-12-18 ENCOUNTER — Ambulatory Visit: Payer: Medicare Other | Admitting: Oncology

## 2021-12-18 DIAGNOSIS — C189 Malignant neoplasm of colon, unspecified: Secondary | ICD-10-CM | POA: Diagnosis not present

## 2021-12-18 DIAGNOSIS — C19 Malignant neoplasm of rectosigmoid junction: Secondary | ICD-10-CM | POA: Diagnosis not present

## 2021-12-18 DIAGNOSIS — R590 Localized enlarged lymph nodes: Secondary | ICD-10-CM | POA: Diagnosis not present

## 2021-12-18 LAB — GLUCOSE, CAPILLARY: Glucose-Capillary: 115 mg/dL — ABNORMAL HIGH (ref 70–99)

## 2021-12-18 MED ORDER — FLUDEOXYGLUCOSE F - 18 (FDG) INJECTION
11.3000 | Freq: Once | INTRAVENOUS | Status: AC | PRN
  Administered 2021-12-18: 12 via INTRAVENOUS

## 2021-12-19 ENCOUNTER — Ambulatory Visit: Payer: Medicare Other | Admitting: Oncology

## 2021-12-19 ENCOUNTER — Ambulatory Visit: Payer: Medicare Other

## 2021-12-19 ENCOUNTER — Other Ambulatory Visit: Payer: Medicare Other

## 2021-12-26 ENCOUNTER — Encounter: Payer: Self-pay | Admitting: Oncology

## 2021-12-26 ENCOUNTER — Encounter: Payer: Self-pay | Admitting: Nurse Practitioner

## 2021-12-26 ENCOUNTER — Other Ambulatory Visit (HOSPITAL_COMMUNITY): Payer: Self-pay

## 2021-12-26 ENCOUNTER — Inpatient Hospital Stay (HOSPITAL_BASED_OUTPATIENT_CLINIC_OR_DEPARTMENT_OTHER): Payer: Medicare Other | Admitting: Nurse Practitioner

## 2021-12-26 ENCOUNTER — Encounter: Payer: Self-pay | Admitting: *Deleted

## 2021-12-26 ENCOUNTER — Inpatient Hospital Stay: Payer: Medicare Other

## 2021-12-26 ENCOUNTER — Telehealth: Payer: Self-pay | Admitting: Pharmacy Technician

## 2021-12-26 ENCOUNTER — Telehealth: Payer: Self-pay | Admitting: Pharmacist

## 2021-12-26 ENCOUNTER — Telehealth: Payer: Self-pay

## 2021-12-26 ENCOUNTER — Inpatient Hospital Stay: Payer: Medicare Other | Attending: Oncology

## 2021-12-26 VITALS — BP 140/63 | HR 76 | Temp 97.9°F | Resp 18 | Ht 76.0 in | Wt 239.4 lb

## 2021-12-26 DIAGNOSIS — Z95828 Presence of other vascular implants and grafts: Secondary | ICD-10-CM

## 2021-12-26 DIAGNOSIS — N189 Chronic kidney disease, unspecified: Secondary | ICD-10-CM | POA: Insufficient documentation

## 2021-12-26 DIAGNOSIS — D6959 Other secondary thrombocytopenia: Secondary | ICD-10-CM | POA: Diagnosis not present

## 2021-12-26 DIAGNOSIS — Z452 Encounter for adjustment and management of vascular access device: Secondary | ICD-10-CM | POA: Insufficient documentation

## 2021-12-26 DIAGNOSIS — C189 Malignant neoplasm of colon, unspecified: Secondary | ICD-10-CM

## 2021-12-26 DIAGNOSIS — D649 Anemia, unspecified: Secondary | ICD-10-CM | POA: Diagnosis not present

## 2021-12-26 DIAGNOSIS — C774 Secondary and unspecified malignant neoplasm of inguinal and lower limb lymph nodes: Secondary | ICD-10-CM | POA: Diagnosis not present

## 2021-12-26 DIAGNOSIS — G620 Drug-induced polyneuropathy: Secondary | ICD-10-CM | POA: Diagnosis not present

## 2021-12-26 DIAGNOSIS — C187 Malignant neoplasm of sigmoid colon: Secondary | ICD-10-CM | POA: Insufficient documentation

## 2021-12-26 LAB — CBC WITH DIFFERENTIAL (CANCER CENTER ONLY)
Abs Immature Granulocytes: 0.02 10*3/uL (ref 0.00–0.07)
Basophils Absolute: 0 10*3/uL (ref 0.0–0.1)
Basophils Relative: 1 %
Eosinophils Absolute: 0.3 10*3/uL (ref 0.0–0.5)
Eosinophils Relative: 6 %
HCT: 31.8 % — ABNORMAL LOW (ref 39.0–52.0)
Hemoglobin: 10.2 g/dL — ABNORMAL LOW (ref 13.0–17.0)
Immature Granulocytes: 0 %
Lymphocytes Relative: 19 %
Lymphs Abs: 0.9 10*3/uL (ref 0.7–4.0)
MCH: 28.8 pg (ref 26.0–34.0)
MCHC: 32.1 g/dL (ref 30.0–36.0)
MCV: 89.8 fL (ref 80.0–100.0)
Monocytes Absolute: 0.3 10*3/uL (ref 0.1–1.0)
Monocytes Relative: 7 %
Neutro Abs: 3.1 10*3/uL (ref 1.7–7.7)
Neutrophils Relative %: 67 %
Platelet Count: 211 10*3/uL (ref 150–400)
RBC: 3.54 MIL/uL — ABNORMAL LOW (ref 4.22–5.81)
RDW: 20.3 % — ABNORMAL HIGH (ref 11.5–15.5)
WBC Count: 4.5 10*3/uL (ref 4.0–10.5)
nRBC: 0 % (ref 0.0–0.2)

## 2021-12-26 LAB — CMP (CANCER CENTER ONLY)
ALT: 8 U/L (ref 0–44)
AST: 8 U/L — ABNORMAL LOW (ref 15–41)
Albumin: 3.8 g/dL (ref 3.5–5.0)
Alkaline Phosphatase: 70 U/L (ref 38–126)
Anion gap: 9 (ref 5–15)
BUN: 34 mg/dL — ABNORMAL HIGH (ref 8–23)
CO2: 21 mmol/L — ABNORMAL LOW (ref 22–32)
Calcium: 9 mg/dL (ref 8.9–10.3)
Chloride: 109 mmol/L (ref 98–111)
Creatinine: 2.54 mg/dL — ABNORMAL HIGH (ref 0.61–1.24)
GFR, Estimated: 26 mL/min — ABNORMAL LOW (ref >60)
Glucose, Bld: 160 mg/dL — ABNORMAL HIGH (ref 70–99)
Potassium: 4.2 mmol/L (ref 3.5–5.1)
Sodium: 139 mmol/L (ref 135–145)
Total Bilirubin: 0.4 mg/dL (ref 0.3–1.2)
Total Protein: 6.8 g/dL (ref 6.5–8.1)

## 2021-12-26 LAB — FERRITIN: Ferritin: 21 ng/mL — ABNORMAL LOW (ref 24–336)

## 2021-12-26 LAB — MAGNESIUM: Magnesium: 2.2 mg/dL (ref 1.7–2.4)

## 2021-12-26 LAB — CEA (ACCESS): CEA (CHCC): 2.88 ng/mL (ref 0.00–5.00)

## 2021-12-26 MED ORDER — DOXYCYCLINE HYCLATE 100 MG PO TABS: 100.0000 mg | ORAL_TABLET | Freq: Two times a day (BID) | ORAL | 2 refills | Status: DC

## 2021-12-26 MED ORDER — SODIUM CHLORIDE 0.9% FLUSH
10.0000 mL | INTRAVENOUS | Status: DC | PRN
  Administered 2021-12-26: 10 mL via INTRAVENOUS

## 2021-12-26 MED ORDER — HEPARIN SOD (PORK) LOCK FLUSH 100 UNIT/ML IV SOLN
500.0000 [IU] | Freq: Once | INTRAVENOUS | Status: AC | PRN
  Administered 2021-12-26: 500 [IU] via INTRAVENOUS

## 2021-12-26 MED ORDER — ENCORAFENIB 75 MG PO CAPS
300.0000 mg | ORAL_CAPSULE | Freq: Every day | ORAL | 0 refills | Status: DC
  Filled 2021-12-26: qty 180, 45d supply, fill #0

## 2021-12-26 NOTE — Telephone Encounter (Signed)
Oral Oncology Patient Advocate Encounter  Prior Authorization for Kathi Der has been approved.    PA# [id number] Effective dates: 12/26/2021 through 12/27/2022  Patients co-pay is $2,786.56.    Lady Deutscher, CPhT-Adv Pharmacy Patient Advocate Specialist [place] Patient Advocate Team Direct Number: [phone]  Fax: [phone]

## 2021-12-26 NOTE — Telephone Encounter (Signed)
Oral Oncology Patient Advocate Encounter   Began application for assistance for Braftovi through [employer].   Application will be submitted upon completion of necessary supporting documentation.   [employer] [facility] phone number [phone].   I will continue to check the status until final determination.   Lady Deutscher, CPhT-Adv Pharmacy Patient Advocate Specialist [place] Patient Advocate Team Direct Number: [phone]  Fax: [phone]

## 2021-12-26 NOTE — Telephone Encounter (Signed)
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 Planned start 01/01/22  Prescription dose and frequency assessed.   Current medication list in Epic reviewed, one DDIs with encorafenib identified: Ondansetron: Ondansetron may enhance the QTc-prolonging effect of encorafenib. This interaction is likely of greater significance with IV ondansetron. Patient's most recent ECG from 11/27/21 chowed an QTc of 465 ms. If patient reports frequent use of ondansetron, consider repeat ECG.   Evaluated chart and no patient barriers to medication adherence identified.   Prescription has been e-scribed to the [facility] for benefits analysis and approval.  Oral Oncology Clinic will continue to follow for insurance authorization, copayment issues, initial counseling and start date.  Patient agreed to treatment on 12/26/21 per MD documentation.  Darl Pikes, PharmD, BCPS, BCOP, CPP Hematology/Oncology Clinical Pharmacist Practitioner [place]/DB/AP [facility] [place] [phone]  12/26/2021 4:29 PM

## 2021-12-26 NOTE — Telephone Encounter (Signed)
Oral Oncology Patient Advocate Encounter   Received notification that prior authorization for Braftovi is required.   PA submitted on 12/26/2021 Key [id number] Status is pending     Lady Deutscher, CPhT-Adv Pharmacy Patient Advocate Specialist [place] Patient Advocate Team Direct Number: [phone]  Fax: [phone]

## 2021-12-26 NOTE — Telephone Encounter (Signed)
Called lab to add magnesium,Tina stated she will add the magnesium.

## 2021-12-26 NOTE — Progress Notes (Signed)
[name] OFFICE PROGRESS NOTE   Diagnosis: Colon cancer  INTERVAL HISTORY:   [facility] [name] returns as scheduled.  He completed a cycle of 5-fluorouracil 11/27/2021.  He overall is feeling well.  No nausea or vomiting.  No mouth sores.  No diarrhea.  No abdominal pain.  He has a good appetite.  He notes an alteration in taste.  Objective:  Vital signs in last 24 hours:  Blood pressure (!) 140/63, pulse 76, temperature 97.9 F (36.6 C), temperature source Oral, resp. rate 18, height $RemoveBe'6\' 4"'aaThLBfVu$  (1.93 m), weight 239 lb 6.4 oz (108.6 kg), SpO2 100 %.    HEENT: No thrush or ulcers. Lymphatics: 2 to 3 cm left axillary lymph node; smaller left axillary lymph node just inferior.  Firm 4 to 5 cm left inguinal node with overlying nodularity. Resp: Lungs clear bilaterally. Cardio: Regular rate and rhythm. GI: Abdomen soft and nontender.  No hepatomegaly.  No mass. Vascular: No leg edema. Port-A-Cath without erythema.  Lab Results:  Lab Results  Component Value Date   WBC 4.5 12/26/2021   HGB 10.2 (L) 12/26/2021   HCT 31.8 (L) 12/26/2021   MCV 89.8 12/26/2021   PLT 211 12/26/2021   NEUTROABS 3.1 12/26/2021    Imaging:  No results found.  Medications: I have reviewed the patient's current medications.  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), isolated mesenteric implant-resected, multiple tumor deposits Sigmoid/descending resection and creation of a descending colostomy 04/10/2016 MSI-stable, no loss of mismatch repair protein expression Foundation 1- BRAF V600E positive, MS-stable, intermediate tumor mutation burden, no RAS mutation CT abdomen/pelvis 04/02/2016-no evidence of distant metastatic disease Cycle 1 FOLFOX 05/21/2016 Cycle 2 FOLFOX 06/04/2016 Cycle 3 FOLFOX 06/18/2016 Cycle 4 FOLFOX 07/02/2016 (oxaliplatin held secondary to thrombocytopenia) Cycle 5 FOLFOX 07/16/2016 Cycle 6 FOLFOX 07/30/2016 Cycle 7 FOLFOX 08/13/2016 (oxaliplatin  held and 5-FU dose reduced) Cycle 8 FOLFOX 09/03/2016 (oxaliplatin held secondary to neuropathy) Cycle 9 FOLFOX 09/17/2016 (oxaliplatin held secondary to neuropathy) Cycle 10 FOLFOX 10/02/2016 Cycle 11 FOLFOX 10/15/2016 (oxaliplatin eliminated from the regimen) Cycle 12 FOLFOX 10/30/2016 (oxaliplatin eliminated) CTs 05/12/2017-no evidence of recurrent disease, right inguinal hernia containing bladder Colonoscopy 07/21/2017, 3 polyps were removed from the descending and transverse colon, fragments of tubular and tubulovillous adenoma CTs 05/19/2018-no evidence of recurrent disease, status post hernia repair, right upper lobe pneumonia CTs 05/20/2019-resolution of right upper lobe pneumonia, no evidence of metastatic disease Colonoscopy 01/25/2020-multiple polyps removed-tubular adenomas, poor preparation, repeat colonoscopy recommended CTs neck, chest, and abdomen/pelvis 08/27/2020- new 2 centimeter left axillary node, new 1.9 cm left inguinal node chronic mildly prominent portacaval node Ultrasound biopsy of left inguinal node on 10/29/2020-metastatic adenocarcinoma consistent with colon adenocarcinoma PET 09/04/7739-OINOMVEH hypermetabolic left inguinal and left axillary nodes, solitary focus of hypermetabolic activity in the right lower quadrant small bowel Cycle 1 FOLFOX 12/19/2020 Cycle 2 FOLFOX 01/02/2021, oxaliplatin dose reduced secondary to neutropenia and thrombocytopenia Cycle 3 FOLFOX 01/16/2021 Cycle 4 FOLFOX 01/30/2021 CTs 02/11/2021-no change in left inguinal and left axillary nodes, no evidence of disease progression, stable wall thickening involving a loop of distal ileum Cycle 1 FOLFIRI/Avastin 02/27/2021 Cycle 2 FOLFIRI/Avastin 03/13/2021 Cycle 3 FOLFIRI/Avastin 04/03/2021 Cycle 4 FOLFIRI 04/17/2021, Avastin held due to elevated urine protein 05/07/2021-24-hour urine protein elevated 1562 Cycle 5 FOLFIRI 05/08/2021, Avastin held due to elevated urine protein, irinotecan dose reduced due to in  general not feeling well after treatment CTs 05/17/2021-left axillary and left inguinal lymph nodes are smaller, no evidence of disease progression Cycle 6 FOLFIRI 05/22/2021, Avastin  held due to proteinuria 06/03/2021 24-hour urine with 1.9 g of protein Cycle 7 FOLFIRI 06/12/2021, Avastin held due to proteinuria Cycle 8 FOLFIRI 07/03/2021, Avastin held due to proteinuria Cycle 9 FOLFIRI 07/24/2021, Avastin held due to proteinuria Cycle 10 FOLFIRI 08/19/2021, Avastin held due to proteinuria CTs 09/02/2021-no change in the left axillary, left inguinal, and portacaval lymph nodes.  No mention of the distal small bowel thickening Maintenance 5-fluorouracil 09/18/2021 CT abdomen/pelvis 10/28/2021-enlarged left inguinal lymph node, stable; stomach and small bowel grossly unremarkable. Maintenance 5-fluorouracil continued Colonoscopy 12/10/2021-terminal ileum with an ulcerated partially obstructing medium-sized mass approximately 15 to 20 cm from the IC valve, invasive adenocarcinoma, colonic type, moderately differentiated (grade 2) PET scan 12/18/2021-progression of disease in the peritoneal space.  2 intensely hypermetabolic nodes in the left axilla, one node is new from the prior, the other is reduced in size and metabolic activity; decrease in size and metabolic activity of left inguinal adenopathy; intense metabolic activity associate with a loop of small bowel in the right lower quadrant, no interval change; new small hypermetabolic peritoneal implant along the ventral peritoneal surface just right of midline; larger new peritoneal implant in the deep right pelvis; small new implant in the right mid abdomen   2.   Chronic renal insufficiency   3.   Hypertension   4.  Inflamed sebaceous cyst at the upper back-status post incision and drainage   5.  Thrombocytopenia secondary chemotherapy-oxaliplatin dose reduced beginning with cycle 3 FOLFOX, oxaliplatin held with cycle 4 and cycle 7 FOLFOX   6.  Oxaliplatin  neuropathy-improved   7.  History of Mucositis secondary chemotherapy   8.  Symptoms of pneumonia January 2020-CT 05/19/2018 consistent with right upper lobe pneumonia, Levaquin prescribed 05/20/2018   9.  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  Anemia, progressive 10/29/2021 2 units packed red blood cells 10/31/2021 Ferritin low 10/29/2021-oral iron  12.  Colonoscopy 12/10/2021-terminal ileum with an ulcerated partially obstructing medium-sized mass approximately 15 to 20 cm from the IC valve, invasive adenocarcinoma, colonic type, moderately differentiated (grade 2)  Disposition: Louis [name] appears stable.  We reviewed the colonoscopy report and biopsy result with Louis [name] and Louis [name].  We reviewed the report/images of the recent PET/CT.  Louis [name] was present by phone.  He understands findings indicate disease progression.  Dr. Gearldine Shown reviewed treatment options to include retreatment with FOLFIRI/Avastin versus Encorafenib/Panitumumab.  Decision made to proceed with encorafenib/Panitumumab.  We reviewed potential toxicities associated with encorafenib including bone marrow toxicity, rash, diarrhea.  We reviewed potential side effects associated with Panitumumab including allergic reaction, diarrhea, rash, paronychia, decreased magnesium.  He was provided with printed information on both drugs.  He understands the rationale for beginning doxycycline.  [facility].  He will begin doxycycline the day prior to cycle 1 Panitumumab.  We discussed the potential for nausea, diarrhea and increased sensitivity to sun while on doxycycline.  He agrees to proceed with the above-noted plan.  He will return to begin every 2-week Panitumumab on 01/01/2022.  We will see him in follow-up prior to Panitumumab on 01/15/2022.  He will contact the office in the interim with any problems.  Patient seen with Dr. Benay Spice.    Ned Card ANP/GNP-BC   12/26/2021  9:18 AM  This was a shared visit with Ned Card.  Louis [name] interviewed and examined.  We reviewed the colonoscopy and PET findings with  him.  We reviewed the PET images.  He has radiologic evidence of disease progression in multiple sites.  He is not a candidate for curative surgery.  We discussed systemic treatment options including repeat treatment with FOLFIRI/bevacizumab.  The tumor has a BRAF mutation.  I recommend salvage systemic therapy with encorafenib and panitumumab.  We reviewed potential toxicities associated with these agents.  He agrees to proceed.  The plan is to begin encorafenib/panitumumab next week.  A treatment plan was entered today.  I was present for greater than 50% of today's visit.  I performed medical decision making.  Julieanne Manson, MD

## 2021-12-26 NOTE — Progress Notes (Addendum)
Patient seen by Ned Card NP today  Vitals are within treatment parameters.  Labs reviewed by Ned Card NP : Creatinine 2.54--OK to treat on 8/30 with labs from today. Per physician team, patient will not be receiving treatment today. Treatment plan change due to progression. Will treat on 01/01/22.

## 2021-12-26 NOTE — Addendum Note (Signed)
Addended by: Terri Skains S on: 12/26/2021 10:00 AM   Modules accepted: Orders

## 2021-12-27 ENCOUNTER — Other Ambulatory Visit: Payer: Self-pay | Admitting: Nurse Practitioner

## 2021-12-27 ENCOUNTER — Other Ambulatory Visit: Payer: Self-pay

## 2021-12-27 DIAGNOSIS — C189 Malignant neoplasm of colon, unspecified: Secondary | ICD-10-CM

## 2021-12-27 DIAGNOSIS — D649 Anemia, unspecified: Secondary | ICD-10-CM

## 2021-12-27 NOTE — Telephone Encounter (Signed)
Oral Oncology Patient Advocate Encounter   Met with patient in lobby to collect paperwork requested by [employer].  Completed application submitted via e-fax to [phone] on 12/27/2021.    [employer] phone number [phone].   I will continue to check the status until final determination.   Lady Deutscher, CPhT-Adv Pharmacy Patient Advocate Specialist [place] Patient Advocate Team Direct Number: [phone]  Fax: [phone]

## 2021-12-28 ENCOUNTER — Inpatient Hospital Stay: Payer: Medicare Other

## 2021-12-30 ENCOUNTER — Encounter: Payer: Self-pay | Admitting: *Deleted

## 2021-12-30 NOTE — Progress Notes (Signed)
Per Nuala Alpha, PharmD his copay is very high for the Braftovi, so they have applied for financial assistance with [place]. Patient reports that he will not be willing to pay the high copay. Most likely will have have a decision by treatment day of 01/01/22.  Asking if MD will proceed with Panitumumab if he does not qualify for assistance. Per Dr. Benay Spice: prefers to start both on same day. Move 8/30 treatment to following week. Scheduling message sent. Mr. Mannor agrees to this plan. Prefers Wednesday at ~ 0830

## 2021-12-31 ENCOUNTER — Other Ambulatory Visit: Payer: Self-pay

## 2021-12-31 ENCOUNTER — Other Ambulatory Visit (HOSPITAL_COMMUNITY): Payer: Self-pay

## 2021-12-31 NOTE — Telephone Encounter (Signed)
Oral Oncology Patient Advocate Encounter   Received notification that the application for assistance for Braftovi through [employer] has been approved.   [employer] phone number [phone].   Effective dates: 12/31/2021 through 05/04/2022  I have spoken to the patient.  Lady Deutscher, CPhT-Adv Oncology Pharmacy Patient [place] Direct Number: [phone]  Fax: [phone]

## 2022-01-01 ENCOUNTER — Other Ambulatory Visit: Payer: Self-pay

## 2022-01-01 ENCOUNTER — Ambulatory Visit: Payer: Medicare Other

## 2022-01-01 NOTE — Telephone Encounter (Signed)
Oral Chemotherapy Pharmacist Encounter  Mr. Fonder reported that his Ceasar Lund would be delivered on 01/02/22. He knows the plan is to get started on 01/08/22 along with his IV treatment.  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   Pt is doing well. Counseled patient on administration, dosing, side effects, monitoring, drug-food interactions, safe handling, storage, and disposal. Patient will take 4 capsules (300 mg total) by mouth daily.  Side effects include but not limited to: nausea, diarrhea, acne-like rash, fatigue.   Acne-like rash: patient has picked up his doxycyline and plans on starting this on 01/07/22 Diarrhea: patient has loperamide on hand, he knows to call the office if he is having 4 or more loose stools per day Nausea: Patient has both ondansetron and prochlorperazine at home. He knows that due to the DDI with ondansetron for QTc, the recommendation is for his to use the prochlorperazine   Reviewed with patient importance of keeping a medication schedule and plan for any missed doses.  After discussion with patient no patient barriers to medication adherence identified.   Mr. Dzikowski voiced understanding and appreciation. All questions answered. Medication handout emailed and mailed to patient.  Provided patient with Oral [facility] phone number. Patient knows to call the office with questions or concerns. Oral Chemotherapy Navigation Clinic will continue to follow.  Darl Pikes, PharmD, BCPS, BCOP, CPP Hematology/Oncology Clinical Pharmacist Practitioner [place]/DB/AP [facility] [place] [facility] [phone]  01/01/2022 12:59 PM

## 2022-01-03 ENCOUNTER — Other Ambulatory Visit: Payer: Self-pay

## 2022-01-07 ENCOUNTER — Other Ambulatory Visit: Payer: Self-pay

## 2022-01-07 NOTE — Progress Notes (Signed)
Pharmacist Chemotherapy Monitoring - Initial Assessment    Anticipated start date: 01/08/22   The following has been reviewed per standard work regarding the patient's treatment regimen: The patient's diagnosis, treatment plan and drug doses, and organ/hematologic function Lab orders and baseline tests specific to treatment regimen  The treatment plan start date, drug sequencing, and pre-medications Prior authorization status  Patient's documented medication list, including drug-drug interaction screen and prescriptions for anti-emetics and supportive care specific to the treatment regimen The drug concentrations, fluid compatibility, administration routes, and timing of the medications to be used The patient's access for treatment and lifetime cumulative dose history, if applicable  The patient's medication allergies and previous infusion related reactions, if applicable   Changes made to treatment plan:  N/A  Follow up needed:  N/A   Kennith Center, Pharm.D., CPP 01/07/2022'@9'$ :55 AM

## 2022-01-08 ENCOUNTER — Inpatient Hospital Stay: Payer: Medicare Other | Attending: Oncology

## 2022-01-08 VITALS — BP 132/66 | HR 58 | Temp 97.8°F | Resp 18 | Wt 238.9 lb

## 2022-01-08 DIAGNOSIS — Z5112 Encounter for antineoplastic immunotherapy: Secondary | ICD-10-CM | POA: Insufficient documentation

## 2022-01-08 DIAGNOSIS — C774 Secondary and unspecified malignant neoplasm of inguinal and lower limb lymph nodes: Secondary | ICD-10-CM | POA: Insufficient documentation

## 2022-01-08 DIAGNOSIS — I129 Hypertensive chronic kidney disease with stage 1 through stage 4 chronic kidney disease, or unspecified chronic kidney disease: Secondary | ICD-10-CM | POA: Insufficient documentation

## 2022-01-08 DIAGNOSIS — C187 Malignant neoplasm of sigmoid colon: Secondary | ICD-10-CM | POA: Insufficient documentation

## 2022-01-08 DIAGNOSIS — G620 Drug-induced polyneuropathy: Secondary | ICD-10-CM | POA: Diagnosis not present

## 2022-01-08 DIAGNOSIS — N189 Chronic kidney disease, unspecified: Secondary | ICD-10-CM | POA: Insufficient documentation

## 2022-01-08 DIAGNOSIS — D6959 Other secondary thrombocytopenia: Secondary | ICD-10-CM | POA: Diagnosis not present

## 2022-01-08 DIAGNOSIS — C189 Malignant neoplasm of colon, unspecified: Secondary | ICD-10-CM

## 2022-01-08 DIAGNOSIS — D649 Anemia, unspecified: Secondary | ICD-10-CM | POA: Diagnosis not present

## 2022-01-08 MED ORDER — SODIUM CHLORIDE 0.9% FLUSH: 10.0000 mL | INTRAVENOUS | Status: DC | PRN

## 2022-01-08 MED ORDER — HEPARIN SOD (PORK) LOCK FLUSH 100 UNIT/ML IV SOLN: 500.0000 [IU] | Freq: Once | INTRAVENOUS | Status: DC | PRN

## 2022-01-08 MED ORDER — SODIUM CHLORIDE 0.9 % IV SOLN
6.0000 mg/kg | Freq: Once | INTRAVENOUS | Status: AC
  Administered 2022-01-08: 700 mg via INTRAVENOUS
  Filled 2022-01-08: qty 20

## 2022-01-08 MED ORDER — SODIUM CHLORIDE 0.9 % IV SOLN: Freq: Once | INTRAVENOUS | Status: AC

## 2022-01-08 NOTE — Progress Notes (Signed)
Per Dr. Benay Spice, ok to treat with labs from 12/26/21.  Scr is elevated due to renal failure.

## 2022-01-08 NOTE — Patient Instructions (Signed)
[facility]  Discharge Instructions: Thank you for choosing [facility] to provide your oncology and hematology care.   If you have a lab appointment with the [facility], please go directly to the [facility] and check in at the registration area.   Wear comfortable clothing and clothing appropriate for easy access to any Portacath or PICC line.   We strive to give you quality time with your provider. You may need to reschedule your appointment if you arrive late (15 or more minutes).  Arriving late affects you and other patients whose appointments are after yours.  Also, if you miss three or more appointments without notifying the office, you may be dismissed from the clinic at the provider's discretion.      For prescription refill requests, have your pharmacy contact our office and allow 72 hours for refills to be completed.    Today you received the following chemotherapy and/or immunotherapy agents: panitumumab      To help prevent nausea and vomiting after your treatment, we encourage you to take your nausea medication as directed.  BELOW ARE SYMPTOMS THAT SHOULD BE REPORTED IMMEDIATELY: *FEVER GREATER THAN 100.4 F (38 C) OR HIGHER *CHILLS OR SWEATING *NAUSEA AND VOMITING THAT IS NOT CONTROLLED WITH YOUR NAUSEA MEDICATION *UNUSUAL SHORTNESS OF BREATH *UNUSUAL BRUISING OR BLEEDING *URINARY PROBLEMS (pain or burning when urinating, or frequent urination) *BOWEL PROBLEMS (unusual diarrhea, constipation, pain near the anus) TENDERNESS IN MOUTH AND THROAT WITH OR WITHOUT PRESENCE OF ULCERS (sore throat, sores in mouth, or a toothache) UNUSUAL RASH, SWELLING OR PAIN  UNUSUAL VAGINAL DISCHARGE OR ITCHING   Items with * indicate a potential emergency and should be followed up as soon as possible or go to the Emergency Department if any problems should occur.  Please show the CHEMOTHERAPY ALERT CARD or IMMUNOTHERAPY ALERT CARD at check-in to  the Emergency Department and triage nurse.  Should you have questions after your visit or need to cancel or reschedule your appointment, please contact [facility]  Dept: [phone]  and follow the prompts.  Office hours are 8:00 a.m. to 4:30 p.m. Monday - Friday. Please note that voicemails left after 4:00 p.m. may not be returned until the following business day.  We are closed weekends and major holidays. You have access to a nurse at all times for urgent questions. Please call the main number to the clinic Dept: [phone] and follow the prompts.   For any non-urgent questions, you may also contact your provider using MyChart. We now offer e-Visits for anyone 17 and older to request care online for non-urgent symptoms. For details visit [email or web address].   Also download the MyChart app! Go to the app store, search "MyChart", open the app, select [facility], and log in with your MyChart username and password.  Masks are optional in the cancer centers. If you would like for your care team to wear a mask while they are taking care of you, please let them know. You may have one support person who is at least 75 years old accompany you for your appointments.  Panitumumab Injection What is this medication? PANITUMUMAB (pan i TOOM ue mab) treats colorectal cancer. It works by blocking a protein that causes cancer cells to grow and multiply. This helps to slow or stop the spread of cancer cells. It is a monoclonal antibody. This medicine may be used for other purposes; ask your health care provider or pharmacist if  you have questions. COMMON BRAND NAME(S): Vectibix What should I tell my care team before I take this medication? They need to know if you have any of these conditions: Eye disease Low levels of magnesium in the blood Lung disease An unusual or allergic reaction to panitumumab, other medications, foods, dyes, or preservatives Pregnant or trying to  get pregnant Breast-feeding How should I use this medication? This medication is injected into a vein. It is given by your care team in a hospital or clinic setting. Talk to your care team about the use of this medication in children. Special care may be needed. Overdosage: If you think you have taken too much of this medicine contact a poison control center or emergency room at once. NOTE: This medicine is only for you. Do not share this medicine with others. What if I miss a dose? Keep appointments for follow-up doses. It is important not to miss your dose. Call your care team if you are unable to keep an appointment. What may interact with this medication? Bevacizumab This list may not describe all possible interactions. Give your health care provider a list of all the medicines, herbs, non-prescription drugs, or dietary supplements you use. Also tell them if you smoke, drink alcohol, or use illegal drugs. Some items may interact with your medicine. What should I watch for while using this medication? Your condition will be monitored carefully while you are receiving this medication. This medication may make you feel generally unwell. This is not uncommon as chemotherapy can affect healthy cells as well as cancer cells. Report any side effects. Continue your course of treatment even though you feel ill unless your care team tells you to stop. This medication can make you more sensitive to the sun. Keep out of the sun while receiving this medication and for 2 months after stopping therapy. If you cannot avoid being in the sun, wear protective clothing and sunscreen. Do not use sun lamps, tanning beds, or tanning booths. Check with your care team if you have severe diarrhea, nausea, and vomiting or if you sweat a lot. The loss of too much body fluid may make it dangerous for you to take this medication. This medication may cause serious skin reactions. They can happen weeks to months after starting  the medication. Contact your care team right away if you notice fevers or flu-like symptoms with a rash. The rash may be red or purple and then turn into blisters or peeling of the skin. You may also notice a red rash with swelling of the face, lips, or lymph nodes in your neck or under your arms. Talk to your care team if you may be pregnant. Serious birth defects can occur if you take this medication during pregnancy and for 2 months after the last dose. Contraception is recommended while taking this medication and for 2 months after the last dose. Your care team can help you find the option that works for you. Do not breastfeed while taking this medication and for 2 months after the last dose. This medication may cause infertility. Talk to your care team if you are concerned about your fertility. What side effects may I notice from receiving this medication? Side effects that you should report to your care team as soon as possible: Allergic reactions--skin rash, itching, hives, swelling of the face, lips, tongue, or throat Dry cough, shortness of breath or trouble breathing Eye pain, redness, irritation, or discharge with blurry or decreased vision Infusion reactions--chest pain,  shortness of breath or trouble breathing, feeling faint or lightheaded Low magnesium level--muscle pain or cramps, unusual weakness or fatigue, fast or irregular heartbeat, tremors Low potassium level--muscle pain or cramps, unusual weakness or fatigue, fast or irregular heartbeat, constipation Redness, blistering, peeling, or loosening of the skin, including inside the mouth Skin reactions on sun-exposed areas Side effects that usually do not require medical attention (report to your care team if they continue or are bothersome): Change in nail shape, thickness, or color Diarrhea Dry skin Fatigue Nausea Vomiting This list may not describe all possible side effects. Call your doctor for medical advice about side  effects. You may report side effects to FDA at 1-800-FDA-1088. Where should I keep my medication? This medication is given in a hospital or clinic. It will not be stored at home. NOTE: This sheet is a summary. It may not cover all possible information. If you have questions about this medicine, talk to your doctor, pharmacist, or health care provider.  2023 Elsevier/Gold Standard (2021-08-14 00:00:00)

## 2022-01-09 ENCOUNTER — Telehealth: Payer: Self-pay

## 2022-01-09 ENCOUNTER — Other Ambulatory Visit: Payer: Self-pay

## 2022-01-09 NOTE — Telephone Encounter (Signed)
Called patient for first time chemo follow-up.  Left voice message instructing patient to contact office with any questions or concerns.

## 2022-01-15 ENCOUNTER — Ambulatory Visit: Payer: Medicare Other | Admitting: Nurse Practitioner

## 2022-01-15 ENCOUNTER — Other Ambulatory Visit: Payer: Medicare Other

## 2022-01-15 ENCOUNTER — Ambulatory Visit: Payer: Medicare Other

## 2022-01-22 ENCOUNTER — Inpatient Hospital Stay: Payer: Medicare Other

## 2022-01-22 ENCOUNTER — Encounter: Payer: Self-pay | Admitting: Nurse Practitioner

## 2022-01-22 ENCOUNTER — Inpatient Hospital Stay (HOSPITAL_BASED_OUTPATIENT_CLINIC_OR_DEPARTMENT_OTHER): Payer: Medicare Other | Admitting: Nurse Practitioner

## 2022-01-22 ENCOUNTER — Encounter: Payer: Self-pay | Admitting: *Deleted

## 2022-01-22 VITALS — BP 145/68 | HR 60 | Resp 20

## 2022-01-22 VITALS — BP 145/71 | HR 84 | Temp 98.1°F | Resp 18 | Ht 76.0 in | Wt 234.0 lb

## 2022-01-22 DIAGNOSIS — Z5112 Encounter for antineoplastic immunotherapy: Secondary | ICD-10-CM | POA: Diagnosis not present

## 2022-01-22 DIAGNOSIS — C774 Secondary and unspecified malignant neoplasm of inguinal and lower limb lymph nodes: Secondary | ICD-10-CM | POA: Diagnosis not present

## 2022-01-22 DIAGNOSIS — I129 Hypertensive chronic kidney disease with stage 1 through stage 4 chronic kidney disease, or unspecified chronic kidney disease: Secondary | ICD-10-CM | POA: Diagnosis not present

## 2022-01-22 DIAGNOSIS — N189 Chronic kidney disease, unspecified: Secondary | ICD-10-CM | POA: Diagnosis not present

## 2022-01-22 DIAGNOSIS — C189 Malignant neoplasm of colon, unspecified: Secondary | ICD-10-CM

## 2022-01-22 DIAGNOSIS — C187 Malignant neoplasm of sigmoid colon: Secondary | ICD-10-CM | POA: Diagnosis not present

## 2022-01-22 DIAGNOSIS — D649 Anemia, unspecified: Secondary | ICD-10-CM

## 2022-01-22 DIAGNOSIS — G620 Drug-induced polyneuropathy: Secondary | ICD-10-CM | POA: Diagnosis not present

## 2022-01-22 LAB — CMP (CANCER CENTER ONLY)
ALT: 6 U/L (ref 0–44)
AST: 7 U/L — ABNORMAL LOW (ref 15–41)
Albumin: 3.9 g/dL (ref 3.5–5.0)
Alkaline Phosphatase: 47 U/L (ref 38–126)
Anion gap: 8 (ref 5–15)
BUN: 40 mg/dL — ABNORMAL HIGH (ref 8–23)
CO2: 18 mmol/L — ABNORMAL LOW (ref 22–32)
Calcium: 8.7 mg/dL — ABNORMAL LOW (ref 8.9–10.3)
Chloride: 112 mmol/L — ABNORMAL HIGH (ref 98–111)
Creatinine: 2.52 mg/dL — ABNORMAL HIGH (ref 0.61–1.24)
GFR, Estimated: 26 mL/min — ABNORMAL LOW (ref >60)
Glucose, Bld: 116 mg/dL — ABNORMAL HIGH (ref 70–99)
Potassium: 4.8 mmol/L (ref 3.5–5.1)
Sodium: 138 mmol/L (ref 135–145)
Total Bilirubin: 0.3 mg/dL (ref 0.3–1.2)
Total Protein: 6.6 g/dL (ref 6.5–8.1)

## 2022-01-22 LAB — CBC WITH DIFFERENTIAL (CANCER CENTER ONLY)
Abs Immature Granulocytes: 0.02 10*3/uL (ref 0.00–0.07)
Basophils Absolute: 0.1 10*3/uL (ref 0.0–0.1)
Basophils Relative: 1 %
Eosinophils Absolute: 0.2 10*3/uL (ref 0.0–0.5)
Eosinophils Relative: 5 %
HCT: 31.4 % — ABNORMAL LOW (ref 39.0–52.0)
Hemoglobin: 10 g/dL — ABNORMAL LOW (ref 13.0–17.0)
Immature Granulocytes: 0 %
Lymphocytes Relative: 23 %
Lymphs Abs: 1 10*3/uL (ref 0.7–4.0)
MCH: 28.7 pg (ref 26.0–34.0)
MCHC: 31.8 g/dL (ref 30.0–36.0)
MCV: 90.2 fL (ref 80.0–100.0)
Monocytes Absolute: 0.8 10*3/uL (ref 0.1–1.0)
Monocytes Relative: 17 %
Neutro Abs: 2.4 10*3/uL (ref 1.7–7.7)
Neutrophils Relative %: 54 %
Platelet Count: 194 10*3/uL (ref 150–400)
RBC: 3.48 MIL/uL — ABNORMAL LOW (ref 4.22–5.81)
RDW: 17.7 % — ABNORMAL HIGH (ref 11.5–15.5)
WBC Count: 4.5 10*3/uL (ref 4.0–10.5)
nRBC: 0 % (ref 0.0–0.2)

## 2022-01-22 LAB — FERRITIN: Ferritin: 14 ng/mL — ABNORMAL LOW (ref 24–336)

## 2022-01-22 LAB — MAGNESIUM: Magnesium: 1.7 mg/dL (ref 1.7–2.4)

## 2022-01-22 MED ORDER — SODIUM CHLORIDE 0.9 % IV SOLN
6.0000 mg/kg | Freq: Once | INTRAVENOUS | Status: AC
  Administered 2022-01-22: 700 mg via INTRAVENOUS
  Filled 2022-01-22: qty 20

## 2022-01-22 MED ORDER — SODIUM CHLORIDE 0.9% FLUSH
10.0000 mL | INTRAVENOUS | Status: DC | PRN
  Administered 2022-01-22: 10 mL

## 2022-01-22 MED ORDER — HEPARIN SOD (PORK) LOCK FLUSH 100 UNIT/ML IV SOLN
500.0000 [IU] | Freq: Once | INTRAVENOUS | Status: AC | PRN
  Administered 2022-01-22: 500 [IU]

## 2022-01-22 MED ORDER — SODIUM CHLORIDE 0.9 % IV SOLN: Freq: Once | INTRAVENOUS | Status: AC

## 2022-01-22 MED ORDER — ENCORAFENIB 75 MG PO CAPS: 300.0000 mg | ORAL_CAPSULE | Freq: Every day | ORAL | 0 refills | Status: DC

## 2022-01-22 NOTE — Progress Notes (Signed)
[facility] OFFICE PROGRESS NOTE   Diagnosis: Colon cancer  INTERVAL HISTORY:   Mr. Borcherding returns as scheduled.  He began encorafenib/Panitumumab 01/08/2022.  Main complaint is being tired.  He had a single episode of mild nausea 2 to 3 days after the first Panitumumab infusion.  No mouth sores.  No diarrhea.  Some constipation.  He is taking a stool softener.  He notes an acne type rash on his face/neck for the past week.  He confirms he is taking doxycycline.  He continues oral iron twice daily.  Objective:  Vital signs in last 24 hours:  Blood pressure (!) 145/71, pulse 84, temperature 98.1 F (36.7 C), temperature source Oral, resp. rate 18, height '6\' 4"'  (1.93 m), weight 234 lb (106.1 kg), SpO2 100 %.    HEENT: No thrush or ulcers. Lymphatics: 2 to 3 cm left axillary lymph node.  Firm 4 to 5 cm left inguinal node with overlying nodularity. Resp: Lungs clear bilaterally. Cardio: Regular rate and rhythm. GI: Abdomen soft and nontender.  No hepatomegaly. Vascular: No leg edema. Neuro: Alert and oriented. Skin: Mild acne type rash neck, upper back, upper chest. Port-A-Cath without erythema.  Lab Results:  Lab Results  Component Value Date   WBC 4.5 01/22/2022   HGB 10.0 (L) 01/22/2022   HCT 31.4 (L) 01/22/2022   MCV 90.2 01/22/2022   PLT 194 01/22/2022   NEUTROABS 2.4 01/22/2022    Imaging:  No results found.  Medications: I have reviewed the patient's current medications.  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), isolated mesenteric implant-resected, multiple tumor deposits Sigmoid/descending resection and creation of a descending colostomy 04/10/2016 MSI-stable, no loss of mismatch repair protein expression Foundation 1- BRAF V600E positive, MS-stable, intermediate tumor mutation burden, no RAS mutation CT abdomen/pelvis 04/02/2016-no evidence of distant metastatic disease Cycle 1 FOLFOX 05/21/2016 Cycle 2 FOLFOX  06/04/2016 Cycle 3 FOLFOX 06/18/2016 Cycle 4 FOLFOX 07/02/2016 (oxaliplatin held secondary to thrombocytopenia) Cycle 5 FOLFOX 07/16/2016 Cycle 6 FOLFOX 07/30/2016 Cycle 7 FOLFOX 08/13/2016 (oxaliplatin held and 5-FU dose reduced) Cycle 8 FOLFOX 09/03/2016 (oxaliplatin held secondary to neuropathy) Cycle 9 FOLFOX 09/17/2016 (oxaliplatin held secondary to neuropathy) Cycle 10 FOLFOX 10/02/2016 Cycle 11 FOLFOX 10/15/2016 (oxaliplatin eliminated from the regimen) Cycle 12 FOLFOX 10/30/2016 (oxaliplatin eliminated) CTs 05/12/2017-no evidence of recurrent disease, right inguinal hernia containing bladder Colonoscopy 07/21/2017, 3 polyps were removed from the descending and transverse colon, fragments of tubular and tubulovillous adenoma CTs 05/19/2018-no evidence of recurrent disease, status post hernia repair, right upper lobe pneumonia CTs 05/20/2019-resolution of right upper lobe pneumonia, no evidence of metastatic disease Colonoscopy 01/25/2020-multiple polyps removed-tubular adenomas, poor preparation, repeat colonoscopy recommended CTs neck, chest, and abdomen/pelvis 08/27/2020- new 2 centimeter left axillary node, new 1.9 cm left inguinal node chronic mildly prominent portacaval node Ultrasound biopsy of left inguinal node on 10/29/2020-metastatic adenocarcinoma consistent with colon adenocarcinoma PET 9/[id number]/[id number] hypermetabolic left inguinal and left axillary nodes, solitary focus of hypermetabolic activity in the right lower quadrant small bowel Cycle 1 FOLFOX 12/19/2020 Cycle 2 FOLFOX 01/02/2021, oxaliplatin dose reduced secondary to neutropenia and thrombocytopenia Cycle 3 FOLFOX 01/16/2021 Cycle 4 FOLFOX 01/30/2021 CTs 02/11/2021-no change in left inguinal and left axillary nodes, no evidence of disease progression, stable wall thickening involving a loop of distal ileum Cycle 1 FOLFIRI/Avastin 02/27/2021 Cycle 2 FOLFIRI/Avastin 03/13/2021 Cycle 3 FOLFIRI/Avastin 04/03/2021 Cycle 4  FOLFIRI 04/17/2021, Avastin held due to elevated urine protein 05/07/2021-24-hour urine protein elevated 1562 Cycle 5 FOLFIRI 05/08/2021, Avastin held due to elevated urine  protein, irinotecan dose reduced due to in general not feeling well after treatment CTs 05/17/2021-left axillary and left inguinal lymph nodes are smaller, no evidence of disease progression Cycle 6 FOLFIRI 05/22/2021, Avastin held due to proteinuria 06/03/2021 24-hour urine with 1.9 g of protein Cycle 7 FOLFIRI 06/12/2021, Avastin held due to proteinuria Cycle 8 FOLFIRI 07/03/2021, Avastin held due to proteinuria Cycle 9 FOLFIRI 07/24/2021, Avastin held due to proteinuria Cycle 10 FOLFIRI 08/19/2021, Avastin held due to proteinuria CTs 09/02/2021-no change in the left axillary, left inguinal, and portacaval lymph nodes.  No mention of the distal small bowel thickening Maintenance 5-fluorouracil 09/18/2021 CT abdomen/pelvis 10/28/2021-enlarged left inguinal lymph node, stable; stomach and small bowel grossly unremarkable. Maintenance 5-fluorouracil continued Colonoscopy 12/10/2021-terminal ileum with an ulcerated partially obstructing medium-sized mass approximately 15 to 20 cm from the IC valve, invasive adenocarcinoma, colonic type, moderately differentiated (grade 2) PET scan 12/18/2021-progression of disease in the peritoneal space.  2 intensely hypermetabolic nodes in the left axilla, one node is new from the prior, the other is reduced in size and metabolic activity; decrease in size and metabolic activity of left inguinal adenopathy; intense metabolic activity associate with a loop of small bowel in the right lower quadrant, no interval change; new small hypermetabolic peritoneal implant along the ventral peritoneal surface just right of midline; larger new peritoneal implant in the deep right pelvis; small new implant in the right mid abdomen Encorafenib/Panitumumab 01/08/2022   2.   Chronic renal insufficiency   3.   Hypertension   4.   Inflamed sebaceous cyst at the upper back-status post incision and drainage   5.  Thrombocytopenia secondary chemotherapy-oxaliplatin dose reduced beginning with cycle 3 FOLFOX, oxaliplatin held with cycle 4 and cycle 7 FOLFOX   6.  Oxaliplatin neuropathy-improved   7.  History of Mucositis secondary chemotherapy   8.  Symptoms of pneumonia January 2020-CT 05/19/2018 consistent with right upper lobe pneumonia, Levaquin prescribed 05/20/2018   9.  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  Anemia, progressive 10/29/2021 2 units packed red blood cells 10/31/2021 Ferritin low 10/29/2021-oral iron   12.  Colonoscopy 12/10/2021-terminal ileum with an ulcerated partially obstructing medium-sized mass approximately 15 to 20 cm from the IC valve, invasive adenocarcinoma, colonic type, moderately differentiated (grade 2)    Disposition: Mr. Vallez appears stable.  He began encorafenib/Panitumumab 2 weeks ago.  Aside from a mild acne type rash he seems to be tolerating treatment well.  Plan to continue the same.  CBC and chemistry panel reviewed.  Labs adequate to proceed as above.  Creatinine is stable.  Hemoglobin is stable.  We will follow-up on the ferritin from today.  He will return for lab, follow-up, Panitumumab in 2 weeks.  We are available to see him sooner if needed.    Ned Card ANP/GNP-BC   01/22/2022  11:39 AM

## 2022-01-22 NOTE — Patient Instructions (Signed)
[facility]   Discharge Instructions: Thank you for choosing [facility] to provide your oncology and hematology care.   If you have a lab appointment with the [facility], please go directly to the [facility] and check in at the registration area.   Wear comfortable clothing and clothing appropriate for easy access to any Portacath or PICC line.   We strive to give you quality time with your provider. You may need to reschedule your appointment if you arrive late (15 or more minutes).  Arriving late affects you and other patients whose appointments are after yours.  Also, if you miss three or more appointments without notifying the office, you may be dismissed from the clinic at the provider's discretion.      For prescription refill requests, have your pharmacy contact our office and allow 72 hours for refills to be completed.    Today you received the following chemotherapy and/or immunotherapy agents Panitumumab (VECTIBIX).      To help prevent nausea and vomiting after your treatment, we encourage you to take your nausea medication as directed.  BELOW ARE SYMPTOMS THAT SHOULD BE REPORTED IMMEDIATELY: *FEVER GREATER THAN 100.4 F (38 C) OR HIGHER *CHILLS OR SWEATING *NAUSEA AND VOMITING THAT IS NOT CONTROLLED WITH YOUR NAUSEA MEDICATION *UNUSUAL SHORTNESS OF BREATH *UNUSUAL BRUISING OR BLEEDING *URINARY PROBLEMS (pain or burning when urinating, or frequent urination) *BOWEL PROBLEMS (unusual diarrhea, constipation, pain near the anus) TENDERNESS IN MOUTH AND THROAT WITH OR WITHOUT PRESENCE OF ULCERS (sore throat, sores in mouth, or a toothache) UNUSUAL RASH, SWELLING OR PAIN  UNUSUAL VAGINAL DISCHARGE OR ITCHING   Items with * indicate a potential emergency and should be followed up as soon as possible or go to the Emergency Department if any problems should occur.  Please show the CHEMOTHERAPY ALERT CARD or IMMUNOTHERAPY ALERT CARD at  check-in to the Emergency Department and triage nurse.  Should you have questions after your visit or need to cancel or reschedule your appointment, please contact [facility]  Dept: [phone]  and follow the prompts.  Office hours are 8:00 a.m. to 4:30 p.m. Monday - Friday. Please note that voicemails left after 4:00 p.m. may not be returned until the following business day.  We are closed weekends and major holidays. You have access to a nurse at all times for urgent questions. Please call the main number to the clinic Dept: [phone] and follow the prompts.   For any non-urgent questions, you may also contact your provider using MyChart. We now offer e-Visits for anyone 50 and older to request care online for non-urgent symptoms. For details visit [email or web address].   Also download the MyChart app! Go to the app store, search "MyChart", open the app, select [facility], and log in with your MyChart username and password.  Masks are optional in the cancer centers. If you would like for your care team to wear a mask while they are taking care of you, please let them know. You may have one support person who is at least 75 years old accompany you for your appointments.  Panitumumab Injection What is this medication? PANITUMUMAB (pan i TOOM ue mab) treats colorectal cancer. It works by blocking a protein that causes cancer cells to grow and multiply. This helps to slow or stop the spread of cancer cells. It is a monoclonal antibody. This medicine may be used for other purposes; ask your health care provider or  pharmacist if you have questions. COMMON BRAND NAME(S): Vectibix What should I tell my care team before I take this medication? They need to know if you have any of these conditions: Eye disease Low levels of magnesium in the blood Lung disease An unusual or allergic reaction to panitumumab, other medications, foods, dyes, or preservatives Pregnant or  trying to get pregnant Breast-feeding How should I use this medication? This medication is injected into a vein. It is given by your care team in a hospital or clinic setting. Talk to your care team about the use of this medication in children. Special care may be needed. Overdosage: If you think you have taken too much of this medicine contact a poison control center or emergency room at once. NOTE: This medicine is only for you. Do not share this medicine with others. What if I miss a dose? Keep appointments for follow-up doses. It is important not to miss your dose. Call your care team if you are unable to keep an appointment. What may interact with this medication? Bevacizumab This list may not describe all possible interactions. Give your health care provider a list of all the medicines, herbs, non-prescription drugs, or dietary supplements you use. Also tell them if you smoke, drink alcohol, or use illegal drugs. Some items may interact with your medicine. What should I watch for while using this medication? Your condition will be monitored carefully while you are receiving this medication. This medication may make you feel generally unwell. This is not uncommon as chemotherapy can affect healthy cells as well as cancer cells. Report any side effects. Continue your course of treatment even though you feel ill unless your care team tells you to stop. This medication can make you more sensitive to the sun. Keep out of the sun while receiving this medication and for 2 months after stopping therapy. If you cannot avoid being in the sun, wear protective clothing and sunscreen. Do not use sun lamps, tanning beds, or tanning booths. Check with your care team if you have severe diarrhea, nausea, and vomiting or if you sweat a lot. The loss of too much body fluid may make it dangerous for you to take this medication. This medication may cause serious skin reactions. They can happen weeks to months  after starting the medication. Contact your care team right away if you notice fevers or flu-like symptoms with a rash. The rash may be red or purple and then turn into blisters or peeling of the skin. You may also notice a red rash with swelling of the face, lips, or lymph nodes in your neck or under your arms. Talk to your care team if you may be pregnant. Serious birth defects can occur if you take this medication during pregnancy and for 2 months after the last dose. Contraception is recommended while taking this medication and for 2 months after the last dose. Your care team can help you find the option that works for you. Do not breastfeed while taking this medication and for 2 months after the last dose. This medication may cause infertility. Talk to your care team if you are concerned about your fertility. What side effects may I notice from receiving this medication? Side effects that you should report to your care team as soon as possible: Allergic reactions--skin rash, itching, hives, swelling of the face, lips, tongue, or throat Dry cough, shortness of breath or trouble breathing Eye pain, redness, irritation, or discharge with blurry or decreased vision Infusion  reactions--chest pain, shortness of breath or trouble breathing, feeling faint or lightheaded Low magnesium level--muscle pain or cramps, unusual weakness or fatigue, fast or irregular heartbeat, tremors Low potassium level--muscle pain or cramps, unusual weakness or fatigue, fast or irregular heartbeat, constipation Redness, blistering, peeling, or loosening of the skin, including inside the mouth Skin reactions on sun-exposed areas Side effects that usually do not require medical attention (report to your care team if they continue or are bothersome): Change in nail shape, thickness, or color Diarrhea Dry skin Fatigue Nausea Vomiting This list may not describe all possible side effects. Call your doctor for medical advice  about side effects. You may report side effects to FDA at 1-800-FDA-1088. Where should I keep my medication? This medication is given in a hospital or clinic. It will not be stored at home. NOTE: This sheet is a summary. It may not cover all possible information. If you have questions about this medicine, talk to your doctor, pharmacist, or health care provider.  2023 Elsevier/Gold Standard (2021-08-14 00:00:00)

## 2022-01-22 NOTE — Progress Notes (Signed)
Patient seen by Ned Card NP today  Vitals are within treatment parameters.  Labs reviewed by Ned Card NP and are not all within treatment parameters. Kidney function is stable with  Creatinine of 2.52  Per physician team, patient is ready for treatment and there are NO modifications to the treatment plan.

## 2022-01-23 ENCOUNTER — Other Ambulatory Visit: Payer: Self-pay

## 2022-01-24 ENCOUNTER — Telehealth: Payer: Self-pay

## 2022-01-24 NOTE — Telephone Encounter (Signed)
Patient states he's not interest in IV iron, and he want to wait for two weeks to see if his ferritin level goes up.

## 2022-01-24 NOTE — Telephone Encounter (Signed)
-----   Message from Owens Shark, NP sent at 01/24/2022  8:50 AM EDT ----- Please let him know the ferritin level remains low.  I discussed IV iron with him at his last visit.  Please check and see if he would be interested in this and let me know.

## 2022-01-27 DIAGNOSIS — N1832 Chronic kidney disease, stage 3b: Secondary | ICD-10-CM | POA: Diagnosis not present

## 2022-01-27 DIAGNOSIS — E785 Hyperlipidemia, unspecified: Secondary | ICD-10-CM | POA: Diagnosis not present

## 2022-01-27 DIAGNOSIS — N390 Urinary tract infection, site not specified: Secondary | ICD-10-CM | POA: Diagnosis not present

## 2022-01-29 ENCOUNTER — Other Ambulatory Visit: Payer: Self-pay

## 2022-01-31 ENCOUNTER — Telehealth: Payer: Self-pay | Admitting: *Deleted

## 2022-01-31 NOTE — Telephone Encounter (Signed)
Louis [name] left VM that he will be out of [place] on 02/04/22. Called [facility] pharmacy and confirmed script was received on 01/22/22 and it was forwarded to [facility] to arrange shipment. Patient is required to call them at [phone] each month to arrange shipment. He did call today and med is being shipped out today. Called Louis [name] and he was informed of this process and he reports he is now aware of this.

## 2022-02-01 ENCOUNTER — Other Ambulatory Visit: Payer: Self-pay

## 2022-02-02 ENCOUNTER — Other Ambulatory Visit: Payer: Self-pay | Admitting: Oncology

## 2022-02-03 ENCOUNTER — Other Ambulatory Visit: Payer: Self-pay

## 2022-02-05 ENCOUNTER — Inpatient Hospital Stay: Payer: Medicare Other | Attending: Oncology

## 2022-02-05 ENCOUNTER — Encounter: Payer: Self-pay | Admitting: Nurse Practitioner

## 2022-02-05 ENCOUNTER — Inpatient Hospital Stay: Payer: Medicare Other

## 2022-02-05 ENCOUNTER — Inpatient Hospital Stay (HOSPITAL_BASED_OUTPATIENT_CLINIC_OR_DEPARTMENT_OTHER): Payer: Medicare Other | Admitting: Nurse Practitioner

## 2022-02-05 VITALS — BP 138/66 | HR 57

## 2022-02-05 DIAGNOSIS — C187 Malignant neoplasm of sigmoid colon: Secondary | ICD-10-CM | POA: Diagnosis not present

## 2022-02-05 DIAGNOSIS — Z23 Encounter for immunization: Secondary | ICD-10-CM | POA: Insufficient documentation

## 2022-02-05 DIAGNOSIS — C189 Malignant neoplasm of colon, unspecified: Secondary | ICD-10-CM | POA: Diagnosis not present

## 2022-02-05 DIAGNOSIS — D6959 Other secondary thrombocytopenia: Secondary | ICD-10-CM | POA: Insufficient documentation

## 2022-02-05 DIAGNOSIS — N189 Chronic kidney disease, unspecified: Secondary | ICD-10-CM | POA: Insufficient documentation

## 2022-02-05 DIAGNOSIS — G620 Drug-induced polyneuropathy: Secondary | ICD-10-CM | POA: Insufficient documentation

## 2022-02-05 DIAGNOSIS — C774 Secondary and unspecified malignant neoplasm of inguinal and lower limb lymph nodes: Secondary | ICD-10-CM | POA: Insufficient documentation

## 2022-02-05 DIAGNOSIS — D649 Anemia, unspecified: Secondary | ICD-10-CM | POA: Diagnosis not present

## 2022-02-05 DIAGNOSIS — Z5112 Encounter for antineoplastic immunotherapy: Secondary | ICD-10-CM | POA: Diagnosis not present

## 2022-02-05 DIAGNOSIS — I129 Hypertensive chronic kidney disease with stage 1 through stage 4 chronic kidney disease, or unspecified chronic kidney disease: Secondary | ICD-10-CM | POA: Insufficient documentation

## 2022-02-05 DIAGNOSIS — R21 Rash and other nonspecific skin eruption: Secondary | ICD-10-CM | POA: Diagnosis not present

## 2022-02-05 LAB — CBC WITH DIFFERENTIAL (CANCER CENTER ONLY)
Abs Immature Granulocytes: 0.02 10*3/uL (ref 0.00–0.07)
Basophils Absolute: 0 10*3/uL (ref 0.0–0.1)
Basophils Relative: 1 %
Eosinophils Absolute: 0.3 10*3/uL (ref 0.0–0.5)
Eosinophils Relative: 7 %
HCT: 32 % — ABNORMAL LOW (ref 39.0–52.0)
Hemoglobin: 10.2 g/dL — ABNORMAL LOW (ref 13.0–17.0)
Immature Granulocytes: 0 %
Lymphocytes Relative: 21 %
Lymphs Abs: 1 10*3/uL (ref 0.7–4.0)
MCH: 28.6 pg (ref 26.0–34.0)
MCHC: 31.9 g/dL (ref 30.0–36.0)
MCV: 89.6 fL (ref 80.0–100.0)
Monocytes Absolute: 0.6 10*3/uL (ref 0.1–1.0)
Monocytes Relative: 12 %
Neutro Abs: 2.8 10*3/uL (ref 1.7–7.7)
Neutrophils Relative %: 59 %
Platelet Count: 136 10*3/uL — ABNORMAL LOW (ref 150–400)
RBC: 3.57 MIL/uL — ABNORMAL LOW (ref 4.22–5.81)
RDW: 15.6 % — ABNORMAL HIGH (ref 11.5–15.5)
WBC Count: 4.7 10*3/uL (ref 4.0–10.5)
nRBC: 0 % (ref 0.0–0.2)

## 2022-02-05 LAB — CMP (CANCER CENTER ONLY)
ALT: 7 U/L (ref 0–44)
AST: 8 U/L — ABNORMAL LOW (ref 15–41)
Albumin: 3.7 g/dL (ref 3.5–5.0)
Alkaline Phosphatase: 50 U/L (ref 38–126)
Anion gap: 9 (ref 5–15)
BUN: 38 mg/dL — ABNORMAL HIGH (ref 8–23)
CO2: 20 mmol/L — ABNORMAL LOW (ref 22–32)
Calcium: 8.9 mg/dL (ref 8.9–10.3)
Chloride: 110 mmol/L (ref 98–111)
Creatinine: 2.32 mg/dL — ABNORMAL HIGH (ref 0.61–1.24)
GFR, Estimated: 29 mL/min — ABNORMAL LOW (ref >60)
Glucose, Bld: 141 mg/dL — ABNORMAL HIGH (ref 70–99)
Potassium: 4.4 mmol/L (ref 3.5–5.1)
Sodium: 139 mmol/L (ref 135–145)
Total Bilirubin: 0.3 mg/dL (ref 0.3–1.2)
Total Protein: 6.7 g/dL (ref 6.5–8.1)

## 2022-02-05 LAB — MAGNESIUM: Magnesium: 1.6 mg/dL — ABNORMAL LOW (ref 1.7–2.4)

## 2022-02-05 MED ORDER — SODIUM CHLORIDE 0.9 % IV SOLN: Freq: Once | INTRAVENOUS | Status: AC

## 2022-02-05 MED ORDER — MAGNESIUM SULFATE 2 GM/50ML IV SOLN
2.0000 g | Freq: Once | INTRAVENOUS | Status: AC
  Administered 2022-02-05: 2 g via INTRAVENOUS
  Filled 2022-02-05: qty 50

## 2022-02-05 MED ORDER — HEPARIN SOD (PORK) LOCK FLUSH 100 UNIT/ML IV SOLN
500.0000 [IU] | Freq: Once | INTRAVENOUS | Status: AC | PRN
  Administered 2022-02-05: 500 [IU]

## 2022-02-05 MED ORDER — DOXYCYCLINE HYCLATE 100 MG PO TABS: 100.0000 mg | ORAL_TABLET | Freq: Two times a day (BID) | ORAL | 2 refills | Status: DC

## 2022-02-05 MED ORDER — SODIUM CHLORIDE 0.9% FLUSH
10.0000 mL | INTRAVENOUS | Status: DC | PRN
  Administered 2022-02-05: 10 mL

## 2022-02-05 MED ORDER — SODIUM CHLORIDE 0.9 % IV SOLN
6.0000 mg/kg | Freq: Once | INTRAVENOUS | Status: AC
  Administered 2022-02-05: 700 mg via INTRAVENOUS
  Filled 2022-02-05: qty 20

## 2022-02-05 NOTE — Progress Notes (Signed)
Patient seen by Ned Card NP today  Vitals are within treatment parameters.  Labs reviewed by Ned Card NP and are not all within treatment parameters. Magnesium is 1.6.  Per physician team, Patient is ready for treatment and there will be adding 2g magnesium with his treatment.

## 2022-02-05 NOTE — Patient Instructions (Signed)
[facility]   Discharge Instructions: Thank you for choosing [facility] to provide your oncology and hematology care.   If you have a lab appointment with the [facility], please go directly to the [facility] and check in at the registration area.   Wear comfortable clothing and clothing appropriate for easy access to any Portacath or PICC line.   We strive to give you quality time with your provider. You may need to reschedule your appointment if you arrive late (15 or more minutes).  Arriving late affects you and other patients whose appointments are after yours.  Also, if you miss three or more appointments without notifying the office, you may be dismissed from the clinic at the provider's discretion.      For prescription refill requests, have your pharmacy contact our office and allow 72 hours for refills to be completed.    Today you received the following chemotherapy and/or immunotherapy agents Vectibix       To help prevent nausea and vomiting after your treatment, we encourage you to take your nausea medication as directed.  BELOW ARE SYMPTOMS THAT SHOULD BE REPORTED IMMEDIATELY: *FEVER GREATER THAN 100.4 F (38 C) OR HIGHER *CHILLS OR SWEATING *NAUSEA AND VOMITING THAT IS NOT CONTROLLED WITH YOUR NAUSEA MEDICATION *UNUSUAL SHORTNESS OF BREATH *UNUSUAL BRUISING OR BLEEDING *URINARY PROBLEMS (pain or burning when urinating, or frequent urination) *BOWEL PROBLEMS (unusual diarrhea, constipation, pain near the anus) TENDERNESS IN MOUTH AND THROAT WITH OR WITHOUT PRESENCE OF ULCERS (sore throat, sores in mouth, or a toothache) UNUSUAL RASH, SWELLING OR PAIN  UNUSUAL VAGINAL DISCHARGE OR ITCHING   Items with * indicate a potential emergency and should be followed up as soon as possible or go to the Emergency Department if any problems should occur.  Please show the CHEMOTHERAPY ALERT CARD or IMMUNOTHERAPY ALERT CARD at check-in to  the Emergency Department and triage nurse.  Should you have questions after your visit or need to cancel or reschedule your appointment, please contact [facility]  Dept: [phone]  and follow the prompts.  Office hours are 8:00 a.m. to 4:30 p.m. Monday - Friday. Please note that voicemails left after 4:00 p.m. may not be returned until the following business day.  We are closed weekends and major holidays. You have access to a nurse at all times for urgent questions. Please call the main number to the clinic Dept: [phone] and follow the prompts.   For any non-urgent questions, you may also contact your provider using MyChart. We now offer e-Visits for anyone 28 and older to request care online for non-urgent symptoms. For details visit [email or web address].   Also download the MyChart app! Go to the app store, search "MyChart", open the app, select [facility], and log in with your MyChart username and password.  Masks are optional in the cancer centers. If you would like for your care team to wear a mask while they are taking care of you, please let them know. You may have one support person who is at least 75 years old accompany you for your appointments.  Panitumumab Injection What is this medication? PANITUMUMAB (pan i TOOM ue mab) treats colorectal cancer. It works by blocking a protein that causes cancer cells to grow and multiply. This helps to slow or stop the spread of cancer cells. It is a monoclonal antibody. This medicine may be used for other purposes; ask your health care provider or  pharmacist if you have questions. COMMON BRAND NAME(S): Vectibix What should I tell my care team before I take this medication? They need to know if you have any of these conditions: Eye disease Low levels of magnesium in the blood Lung disease An unusual or allergic reaction to panitumumab, other medications, foods, dyes, or preservatives Pregnant or trying to  get pregnant Breast-feeding How should I use this medication? This medication is injected into a vein. It is given by your care team in a hospital or clinic setting. Talk to your care team about the use of this medication in children. Special care may be needed. Overdosage: If you think you have taken too much of this medicine contact a poison control center or emergency room at once. NOTE: This medicine is only for you. Do not share this medicine with others. What if I miss a dose? Keep appointments for follow-up doses. It is important not to miss your dose. Call your care team if you are unable to keep an appointment. What may interact with this medication? Bevacizumab This list may not describe all possible interactions. Give your health care provider a list of all the medicines, herbs, non-prescription drugs, or dietary supplements you use. Also tell them if you smoke, drink alcohol, or use illegal drugs. Some items may interact with your medicine. What should I watch for while using this medication? Your condition will be monitored carefully while you are receiving this medication. This medication may make you feel generally unwell. This is not uncommon as chemotherapy can affect healthy cells as well as cancer cells. Report any side effects. Continue your course of treatment even though you feel ill unless your care team tells you to stop. This medication can make you more sensitive to the sun. Keep out of the sun while receiving this medication and for 2 months after stopping therapy. If you cannot avoid being in the sun, wear protective clothing and sunscreen. Do not use sun lamps, tanning beds, or tanning booths. Check with your care team if you have severe diarrhea, nausea, and vomiting or if you sweat a lot. The loss of too much body fluid may make it dangerous for you to take this medication. This medication may cause serious skin reactions. They can happen weeks to months after starting  the medication. Contact your care team right away if you notice fevers or flu-like symptoms with a rash. The rash may be red or purple and then turn into blisters or peeling of the skin. You may also notice a red rash with swelling of the face, lips, or lymph nodes in your neck or under your arms. Talk to your care team if you may be pregnant. Serious birth defects can occur if you take this medication during pregnancy and for 2 months after the last dose. Contraception is recommended while taking this medication and for 2 months after the last dose. Your care team can help you find the option that works for you. Do not breastfeed while taking this medication and for 2 months after the last dose. This medication may cause infertility. Talk to your care team if you are concerned about your fertility. What side effects may I notice from receiving this medication? Side effects that you should report to your care team as soon as possible: Allergic reactions--skin rash, itching, hives, swelling of the face, lips, tongue, or throat Dry cough, shortness of breath or trouble breathing Eye pain, redness, irritation, or discharge with blurry or decreased vision Infusion  reactions--chest pain, shortness of breath or trouble breathing, feeling faint or lightheaded Low magnesium level--muscle pain or cramps, unusual weakness or fatigue, fast or irregular heartbeat, tremors Low potassium level--muscle pain or cramps, unusual weakness or fatigue, fast or irregular heartbeat, constipation Redness, blistering, peeling, or loosening of the skin, including inside the mouth Skin reactions on sun-exposed areas Side effects that usually do not require medical attention (report to your care team if they continue or are bothersome): Change in nail shape, thickness, or color Diarrhea Dry skin Fatigue Nausea Vomiting This list may not describe all possible side effects. Call your doctor for medical advice about side  effects. You may report side effects to FDA at 1-800-FDA-1088. Where should I keep my medication? This medication is given in a hospital or clinic. It will not be stored at home. NOTE: This sheet is a summary. It may not cover all possible information. If you have questions about this medicine, talk to your doctor, pharmacist, or health care provider.  2023 Elsevier/Gold Standard (2021-08-14 00:00:00)  Hypomagnesemia Hypomagnesemia is a condition in which the level of magnesium in the blood is too low. Magnesium is a mineral that is found in many foods. It is used in many different processes in the body. Hypomagnesemia can affect every organ in the body. In severe cases, it can cause life-threatening problems. What are the causes? This condition may be caused by: Not getting enough magnesium in your diet or not having enough healthy foods to eat (malnutrition). Problems with magnesium absorption in the intestines. Dehydration. Excessive use of alcohol. Vomiting. Severe or long-term (chronic) diarrhea. Some medicines, including medicines that make you urinate more often (diuretics). Certain diseases, such as kidney disease, diabetes, celiac disease, and overactive thyroid. What are the signs or symptoms? Symptoms of this condition include: Loss of appetite, nausea, and vomiting. Involuntary shaking or trembling of a body part (tremor). Muscle weakness or tingling in the arms and legs. Sudden tightening of muscles (muscle spasms). Confusion. Psychiatric issues, such as: Depression and irritability. Psychosis. A feeling of fluttering of the heart (palpitations). Seizures. These symptoms are more severe if magnesium levels drop suddenly. How is this diagnosed? This condition may be diagnosed based on: Your symptoms and medical history. A physical exam. Blood and urine tests. How is this treated? Treatment depends on the cause and the severity of the condition. It may be treated  by: Taking a magnesium supplement. This can be taken in pill form. If the condition is severe, magnesium is usually given through an IV. Making changes to your diet. You may be directed to eat foods that have a lot of magnesium, such as green leafy vegetables, peas, beans, and nuts. Not drinking alcohol. If you are struggling not to drink, ask your health care provider for help. Follow these instructions at home: Eating and drinking     Make sure that your diet includes foods with magnesium. Foods that have a lot of magnesium in them include: Green leafy vegetables, such as spinach and broccoli. Beans and peas. Nuts and seeds, such as almonds and sunflower seeds. Whole grains, such as whole grain bread and fortified cereals. Drink fluids that contain salts and minerals (electrolytes), such as sports drinks, when you are active. Do not drink alcohol. General instructions Take over-the-counter and prescription medicines only as told by your health care provider. Take magnesium supplements as directed if your health care provider tells you to take them. Have your magnesium levels monitored as told by your health care  provider. Keep all follow-up visits. This is important. Contact a health care provider if: You get worse instead of better. Your symptoms return. Get help right away if: You develop severe muscle weakness. You have trouble breathing. You feel that your heart is racing. These symptoms may represent a serious problem that is an emergency. Do not wait to see if the symptoms will go away. Get medical help right away. Call your local emergency services (911 in the [place] Do not drive yourself to the hospital. Summary Hypomagnesemia is a condition in which the level of magnesium in the blood is too low. Hypomagnesemia can affect every organ in the body. Treatment may include eating more foods that contain magnesium, taking magnesium supplements, and not drinking alcohol. Have  your magnesium levels monitored as told by your health care provider. This information is not intended to replace advice given to you by your health care provider. Make sure you discuss any questions you have with your health care provider. Document Revised: 09/18/2020 Document Reviewed: 09/18/2020 Elsevier Patient Education  [place].

## 2022-02-05 NOTE — Progress Notes (Signed)
[facility] OFFICE PROGRESS NOTE   Diagnosis: Colon cancer  INTERVAL HISTORY:   Louis [name] returns as scheduled.  He continues encorafenib.  He completed another cycle of Panitumumab 01/22/2022.  No significant rash.  He denies nausea/vomiting.  No mouth sores.  No diarrhea.  He thinks the left axillary lymph node is smaller.  Objective:  Vital signs in last 24 hours:  Blood pressure 136/64, pulse 75, temperature 98.1 F (36.7 C), temperature source Oral, resp. rate 20, height $RemoveBe'6\' 4"'oVQHeizTd$  (1.93 m), weight 232 lb 9.6 oz (105.5 kg), SpO2 100 %.    HEENT: No thrush or ulcers. Lymphatics: 1 to 2 cm left axillary lymph node, question 3 cm lymph node just inferior; firm 4 cm left inguinal lymph node with overlying nodularity.  The nodularity feels less prominent. Resp: Lungs clear bilaterally. Cardio: Regular rate and rhythm. GI: Abdomen soft and nontender.  No hepatomegaly. Vascular: No leg edema. Neuro: Alert and oriented. Skin: Mild acne type rash over the neck, upper back, upper chest. Port-A-Cath without erythema.   Lab Results:  Lab Results  Component Value Date   WBC 4.7 02/05/2022   HGB 10.2 (L) 02/05/2022   HCT 32.0 (L) 02/05/2022   MCV 89.6 02/05/2022   PLT 136 (L) 02/05/2022   NEUTROABS 2.8 02/05/2022    Imaging:  No results found.  Medications: I have reviewed the patient's current medications.  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), isolated mesenteric implant-resected, multiple tumor deposits Sigmoid/descending resection and creation of a descending colostomy 04/10/2016 MSI-stable, no loss of mismatch repair protein expression Foundation 1- BRAF V600E positive, MS-stable, intermediate tumor mutation burden, no RAS mutation CT abdomen/pelvis 04/02/2016-no evidence of distant metastatic disease Cycle 1 FOLFOX 05/21/2016 Cycle 2 FOLFOX 06/04/2016 Cycle 3 FOLFOX 06/18/2016 Cycle 4 FOLFOX 07/02/2016 (oxaliplatin held  secondary to thrombocytopenia) Cycle 5 FOLFOX 07/16/2016 Cycle 6 FOLFOX 07/30/2016 Cycle 7 FOLFOX 08/13/2016 (oxaliplatin held and 5-FU dose reduced) Cycle 8 FOLFOX 09/03/2016 (oxaliplatin held secondary to neuropathy) Cycle 9 FOLFOX 09/17/2016 (oxaliplatin held secondary to neuropathy) Cycle 10 FOLFOX 10/02/2016 Cycle 11 FOLFOX 10/15/2016 (oxaliplatin eliminated from the regimen) Cycle 12 FOLFOX 10/30/2016 (oxaliplatin eliminated) CTs 05/12/2017-no evidence of recurrent disease, right inguinal hernia containing bladder Colonoscopy 07/21/2017, 3 polyps were removed from the descending and transverse colon, fragments of tubular and tubulovillous adenoma CTs 05/19/2018-no evidence of recurrent disease, status post hernia repair, right upper lobe pneumonia CTs 05/20/2019-resolution of right upper lobe pneumonia, no evidence of metastatic disease Colonoscopy 01/25/2020-multiple polyps removed-tubular adenomas, poor preparation, repeat colonoscopy recommended CTs neck, chest, and abdomen/pelvis 08/27/2020- new 2 centimeter left axillary node, new 1.9 cm left inguinal node chronic mildly prominent portacaval node Ultrasound biopsy of left inguinal node on 10/29/2020-metastatic adenocarcinoma consistent with colon adenocarcinoma PET 7/34/1937-[id number] hypermetabolic left inguinal and left axillary nodes, solitary focus of hypermetabolic activity in the right lower quadrant small bowel Cycle 1 FOLFOX 12/19/2020 Cycle 2 FOLFOX 01/02/2021, oxaliplatin dose reduced secondary to neutropenia and thrombocytopenia Cycle 3 FOLFOX 01/16/2021 Cycle 4 FOLFOX 01/30/2021 CTs 02/11/2021-no change in left inguinal and left axillary nodes, no evidence of disease progression, stable wall thickening involving a loop of distal ileum Cycle 1 FOLFIRI/Avastin 02/27/2021 Cycle 2 FOLFIRI/Avastin 03/13/2021 Cycle 3 FOLFIRI/Avastin 04/03/2021 Cycle 4 FOLFIRI 04/17/2021, Avastin held due to elevated urine protein 05/07/2021-24-hour urine  protein elevated 1562 Cycle 5 FOLFIRI 05/08/2021, Avastin held due to elevated urine protein, irinotecan dose reduced due to in general not feeling well after treatment CTs 05/17/2021-left axillary and left inguinal lymph nodes  are smaller, no evidence of disease progression Cycle 6 FOLFIRI 05/22/2021, Avastin held due to proteinuria 06/03/2021 24-hour urine with 1.9 g of protein Cycle 7 FOLFIRI 06/12/2021, Avastin held due to proteinuria Cycle 8 FOLFIRI 07/03/2021, Avastin held due to proteinuria Cycle 9 FOLFIRI 07/24/2021, Avastin held due to proteinuria Cycle 10 FOLFIRI 08/19/2021, Avastin held due to proteinuria CTs 09/02/2021-no change in the left axillary, left inguinal, and portacaval lymph nodes.  No mention of the distal small bowel thickening Maintenance 5-fluorouracil 09/18/2021 CT abdomen/pelvis 10/28/2021-enlarged left inguinal lymph node, stable; stomach and small bowel grossly unremarkable. Maintenance 5-fluorouracil continued Colonoscopy 12/10/2021-terminal ileum with an ulcerated partially obstructing medium-sized mass approximately 15 to 20 cm from the IC valve, invasive adenocarcinoma, colonic type, moderately differentiated (grade 2) PET scan 12/18/2021-progression of disease in the peritoneal space.  2 intensely hypermetabolic nodes in the left axilla, one node is new from the prior, the other is reduced in size and metabolic activity; decrease in size and metabolic activity of left inguinal adenopathy; intense metabolic activity associate with a loop of small bowel in the right lower quadrant, no interval change; new small hypermetabolic peritoneal implant along the ventral peritoneal surface just right of midline; larger new peritoneal implant in the deep right pelvis; small new implant in the right mid abdomen Encorafenib/Panitumumab 01/08/2022   2.   Chronic renal insufficiency   3.   Hypertension   4.  Inflamed sebaceous cyst at the upper back-status post incision and drainage   5.   Thrombocytopenia secondary chemotherapy-oxaliplatin dose reduced beginning with cycle 3 FOLFOX, oxaliplatin held with cycle 4 and cycle 7 FOLFOX   6.  Oxaliplatin neuropathy-improved   7.  History of Mucositis secondary chemotherapy   8.  Symptoms of pneumonia January 2020-CT 05/19/2018 consistent with right upper lobe pneumonia, Levaquin prescribed 05/20/2018   9.  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  Anemia, progressive 10/29/2021 2 units packed red blood cells 10/31/2021 Ferritin low 10/29/2021-oral iron Ferritin low 01/22/2022   12.  Colonoscopy 12/10/2021-terminal ileum with an ulcerated partially obstructing medium-sized mass approximately 15 to 20 cm from the IC valve, invasive adenocarcinoma, colonic type, moderately differentiated (grade 2)    Disposition: Louis [name] appears unchanged.  He continues encorafenib and every 2-week Panitumumab.  Overall he seems to be tolerating treatment well.  Plan to continue the same.  He is scheduled to receive Panitumumab today.  CBC and chemistry panel reviewed.  Labs adequate to proceed with treatment as scheduled.  He has mild hypomagnesemia.  He will receive IV magnesium today.  We reviewed the ferritin level from 2 weeks ago, remains low.  We discussed IV iron including the potential for an allergic reaction.  He would like to proceed with IV iron.  We will schedule with his next treatment.  He will return for lab, follow-up, Panitumumab and IV iron in 2 weeks.  We are available to see him sooner if needed.    Ned Card ANP/GNP-BC   02/05/2022  9:14 AM

## 2022-02-06 ENCOUNTER — Other Ambulatory Visit: Payer: Self-pay

## 2022-02-08 ENCOUNTER — Other Ambulatory Visit: Payer: Self-pay

## 2022-02-15 ENCOUNTER — Other Ambulatory Visit: Payer: Self-pay | Admitting: Oncology

## 2022-02-18 ENCOUNTER — Inpatient Hospital Stay: Payer: Medicare Other

## 2022-02-18 ENCOUNTER — Encounter: Payer: Self-pay | Admitting: *Deleted

## 2022-02-18 ENCOUNTER — Inpatient Hospital Stay (HOSPITAL_BASED_OUTPATIENT_CLINIC_OR_DEPARTMENT_OTHER): Payer: Medicare Other | Admitting: Oncology

## 2022-02-18 VITALS — BP 148/68 | HR 74 | Temp 98.1°F | Resp 18 | Ht 76.0 in | Wt 231.8 lb

## 2022-02-18 DIAGNOSIS — N189 Chronic kidney disease, unspecified: Secondary | ICD-10-CM | POA: Diagnosis not present

## 2022-02-18 DIAGNOSIS — Z23 Encounter for immunization: Secondary | ICD-10-CM | POA: Diagnosis not present

## 2022-02-18 DIAGNOSIS — I129 Hypertensive chronic kidney disease with stage 1 through stage 4 chronic kidney disease, or unspecified chronic kidney disease: Secondary | ICD-10-CM | POA: Diagnosis not present

## 2022-02-18 DIAGNOSIS — C187 Malignant neoplasm of sigmoid colon: Secondary | ICD-10-CM | POA: Diagnosis not present

## 2022-02-18 DIAGNOSIS — Z95828 Presence of other vascular implants and grafts: Secondary | ICD-10-CM

## 2022-02-18 DIAGNOSIS — C189 Malignant neoplasm of colon, unspecified: Secondary | ICD-10-CM

## 2022-02-18 DIAGNOSIS — Z5112 Encounter for antineoplastic immunotherapy: Secondary | ICD-10-CM | POA: Diagnosis not present

## 2022-02-18 DIAGNOSIS — C774 Secondary and unspecified malignant neoplasm of inguinal and lower limb lymph nodes: Secondary | ICD-10-CM | POA: Diagnosis not present

## 2022-02-18 LAB — CBC WITH DIFFERENTIAL (CANCER CENTER ONLY)
Abs Immature Granulocytes: 0.02 10*3/uL (ref 0.00–0.07)
Basophils Absolute: 0 10*3/uL (ref 0.0–0.1)
Basophils Relative: 1 %
Eosinophils Absolute: 0.3 10*3/uL (ref 0.0–0.5)
Eosinophils Relative: 5 %
HCT: 32.9 % — ABNORMAL LOW (ref 39.0–52.0)
Hemoglobin: 10.5 g/dL — ABNORMAL LOW (ref 13.0–17.0)
Immature Granulocytes: 0 %
Lymphocytes Relative: 22 %
Lymphs Abs: 1.2 10*3/uL (ref 0.7–4.0)
MCH: 28.5 pg (ref 26.0–34.0)
MCHC: 31.9 g/dL (ref 30.0–36.0)
MCV: 89.4 fL (ref 80.0–100.0)
Monocytes Absolute: 0.7 10*3/uL (ref 0.1–1.0)
Monocytes Relative: 12 %
Neutro Abs: 3.3 10*3/uL (ref 1.7–7.7)
Neutrophils Relative %: 60 %
Platelet Count: 160 10*3/uL (ref 150–400)
RBC: 3.68 MIL/uL — ABNORMAL LOW (ref 4.22–5.81)
RDW: 14.8 % (ref 11.5–15.5)
WBC Count: 5.5 10*3/uL (ref 4.0–10.5)
nRBC: 0 % (ref 0.0–0.2)

## 2022-02-18 LAB — CMP (CANCER CENTER ONLY)
ALT: 6 U/L (ref 0–44)
AST: 7 U/L — ABNORMAL LOW (ref 15–41)
Albumin: 3.7 g/dL (ref 3.5–5.0)
Alkaline Phosphatase: 54 U/L (ref 38–126)
Anion gap: 9 (ref 5–15)
BUN: 43 mg/dL — ABNORMAL HIGH (ref 8–23)
CO2: 20 mmol/L — ABNORMAL LOW (ref 22–32)
Calcium: 9 mg/dL (ref 8.9–10.3)
Chloride: 112 mmol/L — ABNORMAL HIGH (ref 98–111)
Creatinine: 2.45 mg/dL — ABNORMAL HIGH (ref 0.61–1.24)
GFR, Estimated: 27 mL/min — ABNORMAL LOW (ref >60)
Glucose, Bld: 112 mg/dL — ABNORMAL HIGH (ref 70–99)
Potassium: 4.9 mmol/L (ref 3.5–5.1)
Sodium: 141 mmol/L (ref 135–145)
Total Bilirubin: 0.3 mg/dL (ref 0.3–1.2)
Total Protein: 6.7 g/dL (ref 6.5–8.1)

## 2022-02-18 LAB — MAGNESIUM: Magnesium: 1.5 mg/dL — ABNORMAL LOW (ref 1.7–2.4)

## 2022-02-18 MED ORDER — SODIUM CHLORIDE 0.9 % IV SOLN: Freq: Once | INTRAVENOUS | Status: AC

## 2022-02-18 MED ORDER — SODIUM CHLORIDE 0.9 % IV SOLN
6.0000 mg/kg | Freq: Once | INTRAVENOUS | Status: AC
  Administered 2022-02-18: 700 mg via INTRAVENOUS
  Filled 2022-02-18: qty 15

## 2022-02-18 MED ORDER — HEPARIN SOD (PORK) LOCK FLUSH 100 UNIT/ML IV SOLN
500.0000 [IU] | Freq: Once | INTRAVENOUS | Status: AC | PRN
  Administered 2022-02-18: 500 [IU]

## 2022-02-18 MED ORDER — SODIUM CHLORIDE 0.9% FLUSH
10.0000 mL | INTRAVENOUS | Status: DC | PRN
  Administered 2022-02-18: 10 mL

## 2022-02-18 MED ORDER — INFLUENZA VAC A&B SA ADJ QUAD 0.5 ML IM PRSY
0.5000 mL | PREFILLED_SYRINGE | Freq: Once | INTRAMUSCULAR | Status: AC
  Administered 2022-02-18: 0.5 mL via INTRAMUSCULAR
  Filled 2022-02-18: qty 0.5

## 2022-02-18 MED ORDER — MAGNESIUM SULFATE 2 GM/50ML IV SOLN
2.0000 g | Freq: Once | INTRAVENOUS | Status: AC
  Administered 2022-02-18: 2 g via INTRAVENOUS
  Filled 2022-02-18: qty 50

## 2022-02-18 MED ORDER — SODIUM CHLORIDE 0.9 % IV SOLN
510.0000 mg | Freq: Once | INTRAVENOUS | Status: AC
  Administered 2022-02-18: 510 mg via INTRAVENOUS
  Filled 2022-02-18: qty 17

## 2022-02-18 NOTE — Progress Notes (Signed)
Ok to keep same dose today per provider

## 2022-02-18 NOTE — Patient Instructions (Signed)
[name]   Discharge Instructions: Thank you for choosing [facility] to provide your oncology and hematology care.   If you have a lab appointment with the [facility], please go directly to the [facility] and check in at the registration area.   Wear comfortable clothing and clothing appropriate for easy access to any Portacath or PICC line.   We strive to give you quality time with your provider. You may need to reschedule your appointment if you arrive late (15 or more minutes).  Arriving late affects you and other patients whose appointments are after yours.  Also, if you miss three or more appointments without notifying the office, you may be dismissed from the clinic at the provider's discretion.      For prescription refill requests, have your pharmacy contact our office and allow 72 hours for refills to be completed.    Today you received the following chemotherapy and/or immunotherapy agents Panitumumab (VECTIBIX).      To help prevent nausea and vomiting after your treatment, we encourage you to take your nausea medication as directed.  BELOW ARE SYMPTOMS THAT SHOULD BE REPORTED IMMEDIATELY: *FEVER GREATER THAN 100.4 F (38 C) OR HIGHER *CHILLS OR SWEATING *NAUSEA AND VOMITING THAT IS NOT CONTROLLED WITH YOUR NAUSEA MEDICATION *UNUSUAL SHORTNESS OF BREATH *UNUSUAL BRUISING OR BLEEDING *URINARY PROBLEMS (pain or burning when urinating, or frequent urination) *BOWEL PROBLEMS (unusual diarrhea, constipation, pain near the anus) TENDERNESS IN MOUTH AND THROAT WITH OR WITHOUT PRESENCE OF ULCERS (sore throat, sores in mouth, or a toothache) UNUSUAL RASH, SWELLING OR PAIN  UNUSUAL VAGINAL DISCHARGE OR ITCHING   Items with * indicate a potential emergency and should be followed up as soon as possible or go to the Emergency Department if any problems should occur.  Please show the CHEMOTHERAPY ALERT CARD or IMMUNOTHERAPY ALERT CARD at  check-in to the Emergency Department and triage nurse.  Should you have questions after your visit or need to cancel or reschedule your appointment, please contact [facility]  Dept: [phone]  and follow the prompts.  Office hours are 8:00 a.m. to 4:30 p.m. Monday - Friday. Please note that voicemails left after 4:00 p.m. may not be returned until the following business day.  We are closed weekends and major holidays. You have access to a nurse at all times for urgent questions. Please call the main number to the clinic Dept: [phone] and follow the prompts.   For any non-urgent questions, you may also contact your provider using MyChart. We now offer e-Visits for anyone 50 and older to request care online for non-urgent symptoms. For details visit [email or web address].   Also download the MyChart app! Go to the app store, search "MyChart", open the app, select [facility], and log in with your MyChart username and password.  Masks are optional in the cancer centers. If you would like for your care team to wear a mask while they are taking care of you, please let them know. You may have one support person who is at least 75 years old accompany you for your appointments.  Panitumumab Injection What is this medication? PANITUMUMAB (pan i TOOM ue mab) treats colorectal cancer. It works by blocking a protein that causes cancer cells to grow and multiply. This helps to slow or stop the spread of cancer cells. It is a monoclonal antibody. This medicine may be used for other purposes; ask your health care provider or  pharmacist if you have questions. COMMON BRAND NAME(S): Vectibix What should I tell my care team before I take this medication? They need to know if you have any of these conditions: Eye disease Low levels of magnesium in the blood Lung disease An unusual or allergic reaction to panitumumab, other medications, foods, dyes, or preservatives Pregnant or  trying to get pregnant Breast-feeding How should I use this medication? This medication is injected into a vein. It is given by your care team in a hospital or clinic setting. Talk to your care team about the use of this medication in children. Special care may be needed. Overdosage: If you think you have taken too much of this medicine contact a poison control center or emergency room at once. NOTE: This medicine is only for you. Do not share this medicine with others. What if I miss a dose? Keep appointments for follow-up doses. It is important not to miss your dose. Call your care team if you are unable to keep an appointment. What may interact with this medication? Bevacizumab This list may not describe all possible interactions. Give your health care provider a list of all the medicines, herbs, non-prescription drugs, or dietary supplements you use. Also tell them if you smoke, drink alcohol, or use illegal drugs. Some items may interact with your medicine. What should I watch for while using this medication? Your condition will be monitored carefully while you are receiving this medication. This medication may make you feel generally unwell. This is not uncommon as chemotherapy can affect healthy cells as well as cancer cells. Report any side effects. Continue your course of treatment even though you feel ill unless your care team tells you to stop. This medication can make you more sensitive to the sun. Keep out of the sun while receiving this medication and for 2 months after stopping therapy. If you cannot avoid being in the sun, wear protective clothing and sunscreen. Do not use sun lamps, tanning beds, or tanning booths. Check with your care team if you have severe diarrhea, nausea, and vomiting or if you sweat a lot. The loss of too much body fluid may make it dangerous for you to take this medication. This medication may cause serious skin reactions. They can happen weeks to months  after starting the medication. Contact your care team right away if you notice fevers or flu-like symptoms with a rash. The rash may be red or purple and then turn into blisters or peeling of the skin. You may also notice a red rash with swelling of the face, lips, or lymph nodes in your neck or under your arms. Talk to your care team if you may be pregnant. Serious birth defects can occur if you take this medication during pregnancy and for 2 months after the last dose. Contraception is recommended while taking this medication and for 2 months after the last dose. Your care team can help you find the option that works for you. Do not breastfeed while taking this medication and for 2 months after the last dose. This medication may cause infertility. Talk to your care team if you are concerned about your fertility. What side effects may I notice from receiving this medication? Side effects that you should report to your care team as soon as possible: Allergic reactions--skin rash, itching, hives, swelling of the face, lips, tongue, or throat Dry cough, shortness of breath or trouble breathing Eye pain, redness, irritation, or discharge with blurry or decreased vision Infusion  reactions--chest pain, shortness of breath or trouble breathing, feeling faint or lightheaded Low magnesium level--muscle pain or cramps, unusual weakness or fatigue, fast or irregular heartbeat, tremors Low potassium level--muscle pain or cramps, unusual weakness or fatigue, fast or irregular heartbeat, constipation Redness, blistering, peeling, or loosening of the skin, including inside the mouth Skin reactions on sun-exposed areas Side effects that usually do not require medical attention (report to your care team if they continue or are bothersome): Change in nail shape, thickness, or color Diarrhea Dry skin Fatigue Nausea Vomiting This list may not describe all possible side effects. Call your doctor for medical advice  about side effects. You may report side effects to FDA at 1-800-FDA-1088. Where should I keep my medication? This medication is given in a hospital or clinic. It will not be stored at home. NOTE: This sheet is a summary. It may not cover all possible information. If you have questions about this medicine, talk to your doctor, pharmacist, or health care provider.  2023 Elsevier/Gold Standard (2021-08-14 00:00:00)  Ferumoxytol Injection What is this medication? FERUMOXYTOL (FER ue MOX i tol) treats low levels of iron in your body (iron deficiency anemia). Iron is a mineral that plays an important role in making red blood cells, which carry oxygen from your lungs to the rest of your body. This medicine may be used for other purposes; ask your health care provider or pharmacist if you have questions. COMMON BRAND NAME(S): Feraheme What should I tell my care team before I take this medication? They need to know if you have any of these conditions: Anemia not caused by low iron levels High levels of iron in the blood Magnetic resonance imaging (MRI) test scheduled An unusual or allergic reaction to iron, other medications, foods, dyes, or preservatives Pregnant or trying to get pregnant Breast-feeding How should I use this medication? This medication is for injection into a vein. It is given in a hospital or clinic setting. Talk to your care team the use of this medication in children. Special care may be needed. Overdosage: If you think you have taken too much of this medicine contact a poison control center or emergency room at once. NOTE: This medicine is only for you. Do not share this medicine with others. What if I miss a dose? It is important not to miss your dose. Call your care team if you are unable to keep an appointment. What may interact with this medication? Other iron products This list may not describe all possible interactions. Give your health care provider a list of all the  medicines, herbs, non-prescription drugs, or dietary supplements you use. Also tell them if you smoke, drink alcohol, or use illegal drugs. Some items may interact with your medicine. What should I watch for while using this medication? Visit your care team regularly. Tell your care team if your symptoms do not start to get better or if they get worse. You may need blood work done while you are taking this medication. You may need to follow a special diet. Talk to your care team. Foods that contain iron include: whole grains/cereals, dried fruits, beans, or peas, leafy green vegetables, and organ meats (liver, kidney). What side effects may I notice from receiving this medication? Side effects that you should report to your care team as soon as possible: Allergic reactions--skin rash, itching, hives, swelling of the face, lips, tongue, or throat Low blood pressure--dizziness, feeling faint or lightheaded, blurry vision Shortness of breath Side effects that  usually do not require medical attention (report to your care team if they continue or are bothersome): Flushing Headache Joint pain Muscle pain Nausea Pain, redness, or irritation at injection site This list may not describe all possible side effects. Call your doctor for medical advice about side effects. You may report side effects to FDA at 1-800-FDA-1088. Where should I keep my medication? This medication is given in a hospital or clinic and will not be stored at home. NOTE: This sheet is a summary. It may not cover all possible information. If you have questions about this medicine, talk to your doctor, pharmacist, or health care provider.  2023 Elsevier/Gold Standard (2020-09-14 00:00:00)  Magnesium Sulfate Injection What is this medication? MAGNESIUM SULFATE (mag NEE zee um SUL fate) prevents and treats low levels of magnesium in your body. It may also be used to prevent and treat seizures during pregnancy in people with high blood  pressure disorders, such as preeclampsia or eclampsia. Magnesium plays an important role in maintaining the health of your muscles and nervous system. This medicine may be used for other purposes; ask your health care provider or pharmacist if you have questions. What should I tell my care team before I take this medication? They need to know if you have any of these conditions: Heart disease History of irregular heart beat Kidney disease An unusual or allergic reaction to magnesium sulfate, medications, foods, dyes, or preservatives Pregnant or trying to get pregnant Breast-feeding How should I use this medication? This medication is for infusion into a vein. It is given in a hospital or clinic setting. Talk to your care team about the use of this medication in children. While this medication may be prescribed for selected conditions, precautions do apply. Overdosage: If you think you have taken too much of this medicine contact a poison control center or emergency room at once. NOTE: This medicine is only for you. Do not share this medicine with others. What if I miss a dose? This does not apply. What may interact with this medication? Certain medications for anxiety or sleep Certain medications for seizures like phenobarbital Digoxin Medications that relax muscles for surgery Narcotic medications for pain This list may not describe all possible interactions. Give your health care provider a list of all the medicines, herbs, non-prescription drugs, or dietary supplements you use. Also tell them if you smoke, drink alcohol, or use illegal drugs. Some items may interact with your medicine. What should I watch for while using this medication? Your condition will be monitored carefully while you are receiving this medication. You may need blood work done while you are receiving this medication. What side effects may I notice from receiving this medication? Side effects that you should report  to your care team as soon as possible: Allergic reactions--skin rash, itching, hives, swelling of the face, lips, tongue, or throat High magnesium level--confusion, drowsiness, facial flushing, redness, sweating, muscle weakness, fast or irregular heartbeat, trouble breathing Low blood pressure--dizziness, feeling faint or lightheaded, blurry vision Side effects that usually do not require medical attention (report to your care team if they continue or are bothersome): Headache Nausea This list may not describe all possible side effects. Call your doctor for medical advice about side effects. You may report side effects to FDA at 1-800-FDA-1088. Where should I keep my medication? This medication is given in a hospital or clinic and will not be stored at home. NOTE: This sheet is a summary. It may not cover all possible  information. If you have questions about this medicine, talk to your doctor, pharmacist, or health care provider.  2023 Elsevier/Gold Standard (2020-06-28 00:00:00)  Influenza Virus Vaccine injection What is this medication? INFLUENZA VIRUS VACCINE (in floo EN zuh VAHY ruhs vak SEEN) helps to reduce the risk of getting influenza also known as the flu. The vaccine only helps protect you against some strains of the flu. This medicine may be used for other purposes; ask your health care provider or pharmacist if you have questions. COMMON BRAND NAME(S): Afluria, Afluria Quadrivalent, Agriflu, Alfuria, FLUAD, FLUAD Quadrivalent, Fluarix, Fluarix Quadrivalent, Flublok, Flublok Quadrivalent, FLUCELVAX, FLUCELVAX Quadrivalent, Flulaval, Flulaval Quadrivalent, Fluvirin, Fluzone, Fluzone High-Dose, Fluzone Intradermal, Fluzone Quadrivalent What should I tell my care team before I take this medication? They need to know if you have any of these conditions: bleeding disorder like hemophilia fever or infection Guillain-Barre syndrome or other neurological problems immune system  problems infection with the human immunodeficiency virus (HIV) or AIDS low blood platelet counts multiple sclerosis an unusual or allergic reaction to influenza virus vaccine, latex, other medicines, foods, dyes, or preservatives. Different brands of vaccines contain different allergens. Some may contain latex or eggs. Talk to your doctor about your allergies to make sure that you get the right vaccine. pregnant or trying to get pregnant breast-feeding How should I use this medication? This vaccine is for injection into a muscle or under the skin. It is given by a health care professional. A copy of Vaccine Information Statements will be given before each vaccination. Read this sheet carefully each time. The sheet may change frequently. Talk to your healthcare provider to see which vaccines are right for you. Some vaccines should not be used in all age groups. Overdosage: If you think you have taken too much of this medicine contact a poison control center or emergency room at once. NOTE: This medicine is only for you. Do not share this medicine with others. What if I miss a dose? This does not apply. What may interact with this medication? chemotherapy or radiation therapy medicines that lower your immune system like etanercept, anakinra, infliximab, and adalimumab medicines that treat or prevent blood clots like warfarin phenytoin steroid medicines like prednisone or cortisone theophylline vaccines This list may not describe all possible interactions. Give your health care provider a list of all the medicines, herbs, non-prescription drugs, or dietary supplements you use. Also tell them if you smoke, drink alcohol, or use illegal drugs. Some items may interact with your medicine. What should I watch for while using this medication? Report any side effects that do not go away within 3 days to your doctor or health care professional. Call your health care provider if any unusual symptoms  occur within 6 weeks of receiving this vaccine. You may still catch the flu, but the illness is not usually as bad. You cannot get the flu from the vaccine. The vaccine will not protect against colds or other illnesses that may cause fever. The vaccine is needed every year. What side effects may I notice from receiving this medication? Side effects that you should report to your doctor or health care professional as soon as possible: allergic reactions like skin rash, itching or hives, swelling of the face, lips, or tongue Side effects that usually do not require medical attention (report to your doctor or health care professional if they continue or are bothersome): fever headache muscle aches and pains pain, tenderness, redness, or swelling at the injection site tiredness This list  may not describe all possible side effects. Call your doctor for medical advice about side effects. You may report side effects to FDA at 1-800-FDA-1088. Where should I keep my medication? The vaccine will be given by a health care professional in a clinic, pharmacy, doctor's office, or other health care setting. You will not be given vaccine doses to store at home. NOTE: This sheet is a summary. It may not cover all possible information. If you have questions about this medicine, talk to your doctor, pharmacist, or health care provider.  2023 Elsevier/Gold Standard (2020-11-23 00:00:00)

## 2022-02-18 NOTE — Progress Notes (Signed)
Patient seen by Dr. Benay Spice today  Vitals are within treatment parameters.  Labs reviewed by Dr. Benay Spice and are not all within treatment parameters. Creatinine 2.45--OK to proceed Mg+ 1.5--OK to proceed w/IV Mg+ today  Per physician team, patient is ready for treatment. Please note that modifications are being made to the treatment plan including Will add 2 grams IV Mg+ today and will receive Feraheme as well.

## 2022-02-18 NOTE — Progress Notes (Signed)
[facility] OFFICE PROGRESS NOTE   Diagnosis: Colon cancer  INTERVAL HISTORY:   Louis [name] completed another treatment with panitumumab on 02/05/2022.  He continues encorafenib.  He has a mild rash over the face and trunk.  No diarrhea.  He feels the palpable lymph nodes are smaller.  Objective:  Vital signs in last 24 hours:  Blood pressure (!) 148/68, pulse 74, temperature 98.1 F (36.7 C), temperature source Oral, resp. rate 18, height '6\' 4"'  (1.93 m), weight 231 lb 12.8 oz (105.1 kg), SpO2 100 %.    HEENT: No thrush or ulcers Lymphatics: 1 cm high mobile left axillary node, 1 cm lower mobile left axillary node.  1 cm low right axillary node.  3-4 cm left inguinal node. Resp: Lungs clear bilaterally Cardio: Regular rate and rhythm GI: No hepatosplenomegaly, no mass, nontender Vascular: No leg edema  Skin: Mild acne type rash over the trunk and face  Portacath/PICC-without erythema  Lab Results:  Lab Results  Component Value Date   WBC 5.5 02/18/2022   HGB 10.5 (L) 02/18/2022   HCT 32.9 (L) 02/18/2022   MCV 89.4 02/18/2022   PLT 160 02/18/2022   NEUTROABS 3.3 02/18/2022    CMP  Lab Results  Component Value Date   NA 141 02/18/2022   K 4.9 02/18/2022   CL 112 (H) 02/18/2022   CO2 20 (L) 02/18/2022   GLUCOSE 112 (H) 02/18/2022   BUN 43 (H) 02/18/2022   CREATININE 2.45 (H) 02/18/2022   CALCIUM 9.0 02/18/2022   PROT 6.7 02/18/2022   ALBUMIN 3.7 02/18/2022   AST 7 (L) 02/18/2022   ALT 6 02/18/2022   ALKPHOS 54 02/18/2022   BILITOT 0.3 02/18/2022   GFRNONAA 27 (L) 02/18/2022   GFRAA 28 (L) 09/20/2019    Lab Results  Component Value Date   CEA1 2.61 01/02/2021   CEA 2.88 12/26/2021    Medications: I have reviewed the patient's current medications.   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), isolated mesenteric implant-resected, multiple tumor deposits Sigmoid/descending resection and creation of a descending  colostomy 04/10/2016 MSI-stable, no loss of mismatch repair protein expression Foundation 1- BRAF V600E positive, MS-stable, intermediate tumor mutation burden, no RAS mutation CT abdomen/pelvis 04/02/2016-no evidence of distant metastatic disease Cycle 1 FOLFOX 05/21/2016 Cycle 2 FOLFOX 06/04/2016 Cycle 3 FOLFOX 06/18/2016 Cycle 4 FOLFOX 07/02/2016 (oxaliplatin held secondary to thrombocytopenia) Cycle 5 FOLFOX 07/16/2016 Cycle 6 FOLFOX 07/30/2016 Cycle 7 FOLFOX 08/13/2016 (oxaliplatin held and 5-FU dose reduced) Cycle 8 FOLFOX 09/03/2016 (oxaliplatin held secondary to neuropathy) Cycle 9 FOLFOX 09/17/2016 (oxaliplatin held secondary to neuropathy) Cycle 10 FOLFOX 10/02/2016 Cycle 11 FOLFOX 10/15/2016 (oxaliplatin eliminated from the regimen) Cycle 12 FOLFOX 10/30/2016 (oxaliplatin eliminated) CTs 05/12/2017-no evidence of recurrent disease, right inguinal hernia containing bladder Colonoscopy 07/21/2017, 3 polyps were removed from the descending and transverse colon, fragments of tubular and tubulovillous adenoma CTs 05/19/2018-no evidence of recurrent disease, status post hernia repair, right upper lobe pneumonia CTs 05/20/2019-resolution of right upper lobe pneumonia, no evidence of metastatic disease Colonoscopy 01/25/2020-multiple polyps removed-tubular adenomas, poor preparation, repeat colonoscopy recommended CTs neck, chest, and abdomen/pelvis 08/27/2020- new 2 centimeter left axillary node, new 1.9 cm left inguinal node chronic mildly prominent portacaval node Ultrasound biopsy of left inguinal node on 10/29/2020-metastatic adenocarcinoma consistent with colon adenocarcinoma PET 10/19/735-[id number] hypermetabolic left inguinal and left axillary nodes, solitary focus of hypermetabolic activity in the right lower quadrant small bowel Cycle 1 FOLFOX 12/19/2020 Cycle 2 FOLFOX 01/02/2021, oxaliplatin dose reduced secondary to  neutropenia and thrombocytopenia Cycle 3 FOLFOX 01/16/2021 Cycle 4  FOLFOX 01/30/2021 CTs 02/11/2021-no change in left inguinal and left axillary nodes, no evidence of disease progression, stable wall thickening involving a loop of distal ileum Cycle 1 FOLFIRI/Avastin 02/27/2021 Cycle 2 FOLFIRI/Avastin 03/13/2021 Cycle 3 FOLFIRI/Avastin 04/03/2021 Cycle 4 FOLFIRI 04/17/2021, Avastin held due to elevated urine protein 05/07/2021-24-hour urine protein elevated 1562 Cycle 5 FOLFIRI 05/08/2021, Avastin held due to elevated urine protein, irinotecan dose reduced due to in general not feeling well after treatment CTs 05/17/2021-left axillary and left inguinal lymph nodes are smaller, no evidence of disease progression Cycle 6 FOLFIRI 05/22/2021, Avastin held due to proteinuria 06/03/2021 24-hour urine with 1.9 g of protein Cycle 7 FOLFIRI 06/12/2021, Avastin held due to proteinuria Cycle 8 FOLFIRI 07/03/2021, Avastin held due to proteinuria Cycle 9 FOLFIRI 07/24/2021, Avastin held due to proteinuria Cycle 10 FOLFIRI 08/19/2021, Avastin held due to proteinuria CTs 09/02/2021-no change in the left axillary, left inguinal, and portacaval lymph nodes.  No mention of the distal small bowel thickening Maintenance 5-fluorouracil 09/18/2021 CT abdomen/pelvis 10/28/2021-enlarged left inguinal lymph node, stable; stomach and small bowel grossly unremarkable. Maintenance 5-fluorouracil continued Colonoscopy 12/10/2021-terminal ileum with an ulcerated partially obstructing medium-sized mass approximately 15 to 20 cm from the IC valve, invasive adenocarcinoma, colonic type, moderately differentiated (grade 2) PET scan 12/18/2021-progression of disease in the peritoneal space.  2 intensely hypermetabolic nodes in the left axilla, one node is new from the prior, the other is reduced in size and metabolic activity; decrease in size and metabolic activity of left inguinal adenopathy; intense metabolic activity associate with a loop of small bowel in the right lower quadrant, no interval change; new small  hypermetabolic peritoneal implant along the ventral peritoneal surface just right of midline; larger new peritoneal implant in the deep right pelvis; small new implant in the right mid abdomen Encorafenib/Panitumumab 01/08/2022   2.   Chronic renal insufficiency   3.   Hypertension   4.  Inflamed sebaceous cyst at the upper back-status post incision and drainage   5.  Thrombocytopenia secondary chemotherapy-oxaliplatin dose reduced beginning with cycle 3 FOLFOX, oxaliplatin held with cycle 4 and cycle 7 FOLFOX   6.  Oxaliplatin neuropathy-improved   7.  History of Mucositis secondary chemotherapy   8.  Symptoms of pneumonia January 2020-CT 05/19/2018 consistent with right upper lobe pneumonia, Levaquin prescribed 05/20/2018   9.  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  Anemia, progressive 10/29/2021 2 units packed red blood cells 10/31/2021 Ferritin low 10/29/2021-oral iron Ferritin low 01/22/2022   12.  Colonoscopy 12/10/2021-terminal ileum with an ulcerated partially obstructing medium-sized mass approximately 15 to 20 cm from the IC valve, invasive adenocarcinoma, colonic type, moderately differentiated (grade 2)      Disposition: Louis [name] appears to be tolerating the panitumumab/encorafenib well.  He will receive IV magnesium today.  He has persistent anemia and reports malaise.  The ferritin mains low when he was here on 01/22/2022.  He will be treated with IV iron today.  We reviewed potential toxicities associated with IV iron including the chance of an allergic/anaphylactic reaction.  He agrees to proceed.  He will return for an office visit and panitumumab in 2 weeks.  The palpable lymph nodes appear partially improved.  He will be referred for a restaging CT after cycle 6 panitumumab.  Betsy Coder, MD  02/18/2022  11:53 AM

## 2022-02-19 ENCOUNTER — Other Ambulatory Visit: Payer: Self-pay

## 2022-02-19 DIAGNOSIS — E669 Obesity, unspecified: Secondary | ICD-10-CM | POA: Diagnosis not present

## 2022-02-19 DIAGNOSIS — N179 Acute kidney failure, unspecified: Secondary | ICD-10-CM | POA: Diagnosis not present

## 2022-02-19 DIAGNOSIS — C187 Malignant neoplasm of sigmoid colon: Secondary | ICD-10-CM | POA: Diagnosis not present

## 2022-02-19 DIAGNOSIS — N1832 Chronic kidney disease, stage 3b: Secondary | ICD-10-CM | POA: Diagnosis not present

## 2022-02-19 DIAGNOSIS — I129 Hypertensive chronic kidney disease with stage 1 through stage 4 chronic kidney disease, or unspecified chronic kidney disease: Secondary | ICD-10-CM | POA: Diagnosis not present

## 2022-02-19 DIAGNOSIS — E785 Hyperlipidemia, unspecified: Secondary | ICD-10-CM | POA: Diagnosis not present

## 2022-02-19 DIAGNOSIS — N2581 Secondary hyperparathyroidism of renal origin: Secondary | ICD-10-CM | POA: Diagnosis not present

## 2022-02-22 ENCOUNTER — Other Ambulatory Visit: Payer: Self-pay

## 2022-02-25 ENCOUNTER — Other Ambulatory Visit: Payer: Self-pay | Admitting: *Deleted

## 2022-02-25 MED ORDER — ENCORAFENIB 75 MG PO CAPS: 300.0000 mg | ORAL_CAPSULE | Freq: Every day | ORAL | 0 refills | Status: DC

## 2022-02-25 NOTE — Telephone Encounter (Signed)
Received fax from [place] that refill is due for his Braftovi. Script sent to [facility].

## 2022-02-28 ENCOUNTER — Telehealth: Payer: Self-pay

## 2022-02-28 ENCOUNTER — Other Ambulatory Visit (HOSPITAL_COMMUNITY): Payer: Self-pay

## 2022-02-28 NOTE — Telephone Encounter (Signed)
Oral Oncology Patient Advocate Encounter   Received notification that patient is due for re-enrollment for assistance for Braftovi through [employer].   Re-enrollment process has been initiated and will be submitted upon completion of necessary documents.  [employer]'s phone number [phone].   I will continue to follow until final determination.  Berdine Addison, [facility] Patient [place]  [phone] (phone) [phone] (fax) 02/28/2022 10:58 AM

## 2022-02-28 NOTE — Telephone Encounter (Signed)
Oral Oncology Patient Advocate Encounter  Reached out and spoke with patient regarding PAP paperwork, explained that I would send it to their preferred email via [employer].   Confirmed email address: [email or web address].    Patient expressed understanding and consent.  Will follow up once paperwork has been signed and returned.   Berdine Addison, [facility] [place]  [phone] (phone) [phone] (fax) 02/28/2022 11:49 AM

## 2022-03-02 ENCOUNTER — Other Ambulatory Visit: Payer: Self-pay | Admitting: Oncology

## 2022-03-05 ENCOUNTER — Inpatient Hospital Stay: Payer: Medicare Other

## 2022-03-05 ENCOUNTER — Inpatient Hospital Stay: Payer: Medicare Other | Attending: Oncology

## 2022-03-05 ENCOUNTER — Inpatient Hospital Stay (HOSPITAL_BASED_OUTPATIENT_CLINIC_OR_DEPARTMENT_OTHER): Payer: Medicare Other | Admitting: Nurse Practitioner

## 2022-03-05 ENCOUNTER — Encounter: Payer: Self-pay | Admitting: Nurse Practitioner

## 2022-03-05 VITALS — BP 136/63 | HR 60 | Resp 20

## 2022-03-05 DIAGNOSIS — I129 Hypertensive chronic kidney disease with stage 1 through stage 4 chronic kidney disease, or unspecified chronic kidney disease: Secondary | ICD-10-CM | POA: Insufficient documentation

## 2022-03-05 DIAGNOSIS — D6959 Other secondary thrombocytopenia: Secondary | ICD-10-CM | POA: Diagnosis not present

## 2022-03-05 DIAGNOSIS — C774 Secondary and unspecified malignant neoplasm of inguinal and lower limb lymph nodes: Secondary | ICD-10-CM | POA: Insufficient documentation

## 2022-03-05 DIAGNOSIS — R21 Rash and other nonspecific skin eruption: Secondary | ICD-10-CM | POA: Diagnosis not present

## 2022-03-05 DIAGNOSIS — N189 Chronic kidney disease, unspecified: Secondary | ICD-10-CM | POA: Insufficient documentation

## 2022-03-05 DIAGNOSIS — C187 Malignant neoplasm of sigmoid colon: Secondary | ICD-10-CM | POA: Insufficient documentation

## 2022-03-05 DIAGNOSIS — Z5112 Encounter for antineoplastic immunotherapy: Secondary | ICD-10-CM | POA: Insufficient documentation

## 2022-03-05 DIAGNOSIS — C189 Malignant neoplasm of colon, unspecified: Secondary | ICD-10-CM | POA: Diagnosis not present

## 2022-03-05 DIAGNOSIS — Z95828 Presence of other vascular implants and grafts: Secondary | ICD-10-CM

## 2022-03-05 DIAGNOSIS — D509 Iron deficiency anemia, unspecified: Secondary | ICD-10-CM | POA: Diagnosis not present

## 2022-03-05 DIAGNOSIS — K4090 Unilateral inguinal hernia, without obstruction or gangrene, not specified as recurrent: Secondary | ICD-10-CM | POA: Diagnosis not present

## 2022-03-05 DIAGNOSIS — G620 Drug-induced polyneuropathy: Secondary | ICD-10-CM | POA: Insufficient documentation

## 2022-03-05 DIAGNOSIS — L723 Sebaceous cyst: Secondary | ICD-10-CM | POA: Diagnosis not present

## 2022-03-05 DIAGNOSIS — R809 Proteinuria, unspecified: Secondary | ICD-10-CM | POA: Diagnosis not present

## 2022-03-05 DIAGNOSIS — R590 Localized enlarged lymph nodes: Secondary | ICD-10-CM | POA: Diagnosis not present

## 2022-03-05 LAB — CMP (CANCER CENTER ONLY)
ALT: 7 U/L (ref 0–44)
AST: 7 U/L — ABNORMAL LOW (ref 15–41)
Albumin: 3.7 g/dL (ref 3.5–5.0)
Alkaline Phosphatase: 54 U/L (ref 38–126)
Anion gap: 8 (ref 5–15)
BUN: 39 mg/dL — ABNORMAL HIGH (ref 8–23)
CO2: 22 mmol/L (ref 22–32)
Calcium: 8.9 mg/dL (ref 8.9–10.3)
Chloride: 108 mmol/L (ref 98–111)
Creatinine: 2.43 mg/dL — ABNORMAL HIGH (ref 0.61–1.24)
GFR, Estimated: 27 mL/min — ABNORMAL LOW (ref >60)
Glucose, Bld: 147 mg/dL — ABNORMAL HIGH (ref 70–99)
Potassium: 4.8 mmol/L (ref 3.5–5.1)
Sodium: 138 mmol/L (ref 135–145)
Total Bilirubin: 0.3 mg/dL (ref 0.3–1.2)
Total Protein: 6.6 g/dL (ref 6.5–8.1)

## 2022-03-05 LAB — CBC WITH DIFFERENTIAL (CANCER CENTER ONLY)
Abs Immature Granulocytes: 0.02 10*3/uL (ref 0.00–0.07)
Basophils Absolute: 0 10*3/uL (ref 0.0–0.1)
Basophils Relative: 1 %
Eosinophils Absolute: 0.2 10*3/uL (ref 0.0–0.5)
Eosinophils Relative: 4 %
HCT: 33.4 % — ABNORMAL LOW (ref 39.0–52.0)
Hemoglobin: 10.9 g/dL — ABNORMAL LOW (ref 13.0–17.0)
Immature Granulocytes: 0 %
Lymphocytes Relative: 18 %
Lymphs Abs: 1 10*3/uL (ref 0.7–4.0)
MCH: 28.7 pg (ref 26.0–34.0)
MCHC: 32.6 g/dL (ref 30.0–36.0)
MCV: 87.9 fL (ref 80.0–100.0)
Monocytes Absolute: 0.6 10*3/uL (ref 0.1–1.0)
Monocytes Relative: 12 %
Neutro Abs: 3.6 10*3/uL (ref 1.7–7.7)
Neutrophils Relative %: 65 %
Platelet Count: 138 10*3/uL — ABNORMAL LOW (ref 150–400)
RBC: 3.8 MIL/uL — ABNORMAL LOW (ref 4.22–5.81)
RDW: 14.9 % (ref 11.5–15.5)
WBC Count: 5.5 10*3/uL (ref 4.0–10.5)
nRBC: 0 % (ref 0.0–0.2)

## 2022-03-05 LAB — MAGNESIUM: Magnesium: 1.4 mg/dL — ABNORMAL LOW (ref 1.7–2.4)

## 2022-03-05 MED ORDER — SODIUM CHLORIDE 0.9 % IV SOLN: Freq: Once | INTRAVENOUS | Status: AC

## 2022-03-05 MED ORDER — MAGNESIUM SULFATE 4 GM/100ML IV SOLN
4.0000 g | Freq: Once | INTRAVENOUS | Status: AC
  Administered 2022-03-05: 4 g via INTRAVENOUS
  Filled 2022-03-05: qty 100

## 2022-03-05 MED ORDER — SODIUM CHLORIDE 0.9% FLUSH
10.0000 mL | INTRAVENOUS | Status: DC | PRN
  Administered 2022-03-05: 10 mL

## 2022-03-05 MED ORDER — HEPARIN SOD (PORK) LOCK FLUSH 100 UNIT/ML IV SOLN
500.0000 [IU] | Freq: Once | INTRAVENOUS | Status: AC | PRN
  Administered 2022-03-05: 500 [IU]

## 2022-03-05 MED ORDER — SODIUM CHLORIDE 0.9 % IV SOLN
6.0000 mg/kg | Freq: Once | INTRAVENOUS | Status: AC
  Administered 2022-03-05: 700 mg via INTRAVENOUS
  Filled 2022-03-05: qty 20

## 2022-03-05 MED ORDER — SODIUM CHLORIDE 0.9 % IV SOLN
510.0000 mg | Freq: Once | INTRAVENOUS | Status: AC
  Administered 2022-03-05: 510 mg via INTRAVENOUS
  Filled 2022-03-05: qty 17

## 2022-03-05 NOTE — Progress Notes (Signed)
Patient seen by Ned Card NP today  Vitals are within treatment parameters.  Labs reviewed by Ned Card NP and are not all within treatment parameters. Magnesium is 1.4.  Per physician team, patient is ready for treatment and there are NO modifications to the treatment plan. He will be receive IV magnesium.

## 2022-03-05 NOTE — Progress Notes (Signed)
Per Ned Card keep dose at '700mg'$  since weight is stable past 4 weeks

## 2022-03-05 NOTE — Progress Notes (Signed)
[facility] OFFICE PROGRESS NOTE   Diagnosis: Colon cancer  INTERVAL HISTORY:   Mr. Belter returns as scheduled.  He continues encorafenib.  He completed another cycle of Panitumumab 02/18/2022.  He received a dose of IV iron 02/18/2022.  No signs of allergic reaction.  He denies nausea/vomiting.  No mouth sores.  No diarrhea.  Mild acne rash.    Objective:  Vital signs in last 24 hours:  Blood pressure 133/65, pulse 76, temperature 98.1 F (36.7 C), temperature source Oral, resp. rate 18, height _0  (1.93 m), weight 231 lb 6.4 oz (105 kg), SpO2 100 %.    HEENT: No thrush or ulcers. Lymphatics: Approximate 1 cm mobile left axillary lymph node.  3 to 4 cm left inguinal node, overlying nodularity is less prominent. Resp: Lungs clear bilaterally. Cardio: Regular rate and rhythm. GI: No hepatomegaly. Vascular: No leg edema. Neuro: Alert and oriented. Skin: Mild acne type rash upper chest. Port-A-Cath without erythema.   Lab Results:  Lab Results  Component Value Date   WBC 5.5 03/05/2022   HGB 10.9 (L) 03/05/2022   HCT 33.4 (L) 03/05/2022   MCV 87.9 03/05/2022   PLT 138 (L) 03/05/2022   NEUTROABS 3.6 03/05/2022    Imaging:  No results found.  Medications: I have reviewed the patient's current medications.  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,V4M), isolated mesenteric implant-resected, multiple tumor deposits Sigmoid/descending resection and creation of a descending colostomy 04/10/2016 MSI-stable, no loss of mismatch repair protein expression Foundation 1- BRAF V600E positive, MS-stable, intermediate tumor mutation burden, no RAS mutation CT abdomen/pelvis 04/02/2016-no evidence of distant metastatic disease Cycle 1 FOLFOX 05/21/2016 Cycle 2 FOLFOX 06/04/2016 Cycle 3 FOLFOX 06/18/2016 Cycle 4 FOLFOX 07/02/2016 (oxaliplatin held secondary to thrombocytopenia) Cycle 5 FOLFOX 07/16/2016 Cycle 6 FOLFOX 07/30/2016 Cycle 7 FOLFOX  08/13/2016 (oxaliplatin held and 5-FU dose reduced) Cycle 8 FOLFOX 09/03/2016 (oxaliplatin held secondary to neuropathy) Cycle 9 FOLFOX 09/17/2016 (oxaliplatin held secondary to neuropathy) Cycle 10 FOLFOX 10/02/2016 Cycle 11 FOLFOX 10/15/2016 (oxaliplatin eliminated from the regimen) Cycle 12 FOLFOX 10/30/2016 (oxaliplatin eliminated) CTs 05/12/2017-no evidence of recurrent disease, right inguinal hernia containing bladder Colonoscopy 07/21/2017, 3 polyps were removed from the descending and transverse colon, fragments of tubular and tubulovillous adenoma CTs 05/19/2018-no evidence of recurrent disease, status post hernia repair, right upper lobe pneumonia CTs 05/20/2019-resolution of right upper lobe pneumonia, no evidence of metastatic disease Colonoscopy 01/25/2020-multiple polyps removed-tubular adenomas, poor preparation, repeat colonoscopy recommended CTs neck, chest, and abdomen/pelvis 08/27/2020- new 2 centimeter left axillary node, new 1.9 cm left inguinal node chronic mildly prominent portacaval node Ultrasound biopsy of left inguinal node on 10/29/2020-metastatic adenocarcinoma consistent with colon adenocarcinoma PET 0/86/[id number] hypermetabolic left inguinal and left axillary nodes, solitary focus of hypermetabolic activity in the right lower quadrant small bowel Cycle 1 FOLFOX 12/19/2020 Cycle 2 FOLFOX 01/02/2021, oxaliplatin dose reduced secondary to neutropenia and thrombocytopenia Cycle 3 FOLFOX 01/16/2021 Cycle 4 FOLFOX 01/30/2021 CTs 02/11/2021-no change in left inguinal and left axillary nodes, no evidence of disease progression, stable wall thickening involving a loop of distal ileum Cycle 1 FOLFIRI/Avastin 02/27/2021 Cycle 2 FOLFIRI/Avastin 03/13/2021 Cycle 3 FOLFIRI/Avastin 04/03/2021 Cycle 4 FOLFIRI 04/17/2021, Avastin held due to elevated urine protein 05/07/2021-24-hour urine protein elevated 1562 Cycle 5 FOLFIRI 05/08/2021, Avastin held due to elevated urine protein,  irinotecan dose reduced due to in general not feeling well after treatment CTs 05/17/2021-left axillary and left inguinal lymph nodes are smaller, no evidence of disease progression Cycle 6 FOLFIRI 05/22/2021, Avastin held  due to proteinuria 06/03/2021 24-hour urine with 1.9 g of protein Cycle 7 FOLFIRI 06/12/2021, Avastin held due to proteinuria Cycle 8 FOLFIRI 07/03/2021, Avastin held due to proteinuria Cycle 9 FOLFIRI 07/24/2021, Avastin held due to proteinuria Cycle 10 FOLFIRI 08/19/2021, Avastin held due to proteinuria CTs 09/02/2021-no change in the left axillary, left inguinal, and portacaval lymph nodes.  No mention of the distal small bowel thickening Maintenance 5-fluorouracil 09/18/2021 CT abdomen/pelvis 10/28/2021-enlarged left inguinal lymph node, stable; stomach and small bowel grossly unremarkable. Maintenance 5-fluorouracil continued Colonoscopy 12/10/2021-terminal ileum with an ulcerated partially obstructing medium-sized mass approximately 15 to 20 cm from the IC valve, invasive adenocarcinoma, colonic type, moderately differentiated (grade 2) PET scan 12/18/2021-progression of disease in the peritoneal space.  2 intensely hypermetabolic nodes in the left axilla, one node is new from the prior, the other is reduced in size and metabolic activity; decrease in size and metabolic activity of left inguinal adenopathy; intense metabolic activity associate with a loop of small bowel in the right lower quadrant, no interval change; new small hypermetabolic peritoneal implant along the ventral peritoneal surface just right of midline; larger new peritoneal implant in the deep right pelvis; small new implant in the right mid abdomen Encorafenib/Panitumumab 01/08/2022   2.   Chronic renal insufficiency   3.   Hypertension   4.  Inflamed sebaceous cyst at the upper back-status post incision and drainage   5.  Thrombocytopenia secondary chemotherapy-oxaliplatin dose reduced beginning with cycle 3 FOLFOX,  oxaliplatin held with cycle 4 and cycle 7 FOLFOX   6.  Oxaliplatin neuropathy-improved   7.  History of Mucositis secondary chemotherapy   8.  Symptoms of pneumonia January 2020-CT 05/19/2018 consistent with right upper lobe pneumonia, Levaquin prescribed 05/20/2018   9.  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  Anemia, progressive 10/29/2021 2 units packed red blood cells 10/31/2021 Ferritin low 10/29/2021-oral iron Ferritin low 01/22/2022   12.  Colonoscopy 12/10/2021-terminal ileum with an ulcerated partially obstructing medium-sized mass approximately 15 to 20 cm from the IC valve, invasive adenocarcinoma, colonic type, moderately differentiated (grade 2)    Disposition: Mr. Chaddock appears stable.  He continues encorafenib and every 2-week Panitumumab.  He is tolerating well.  We will receive Panitumumab today.  CBC and chemistry panel reviewed.  Labs adequate to proceed as above.  Creatinine with stable elevation.  Magnesium level is low.  He will receive IV magnesium today.  He is scheduled for a second dose of IV iron today as well.  He will return for lab, follow-up, Panitumumab in 2 weeks.  We are available to see him sooner if needed.    Ned Card ANP/GNP-BC   03/05/2022  8:45 AM

## 2022-03-05 NOTE — Telephone Encounter (Signed)
Oral Oncology Patient Advocate Encounter   Submitted application for assistance for Braftovi to [employer].   Application submitted via e-fax to [phone]   [employer]'s phone number [phone].   I will continue to check the status until final determination.   Berdine Addison, [facility] Patient [place]  [phone] (phone) [phone] (fax) 03/05/2022 10:35 AM

## 2022-03-05 NOTE — Patient Instructions (Signed)
[name]   Discharge Instructions: Thank you for choosing [facility] to provide your oncology and hematology care.   If you have a lab appointment with the [facility], please go directly to the [facility] and check in at the registration area.   Wear comfortable clothing and clothing appropriate for easy access to any Portacath or PICC line.   We strive to give you quality time with your provider. You may need to reschedule your appointment if you arrive late (15 or more minutes).  Arriving late affects you and other patients whose appointments are after yours.  Also, if you miss three or more appointments without notifying the office, you may be dismissed from the clinic at the provider's discretion.      For prescription refill requests, have your pharmacy contact our office and allow 72 hours for refills to be completed.    Today you received the following chemotherapy and/or immunotherapy agents Panitumumab (VECTIBIX).      To help prevent nausea and vomiting after your treatment, we encourage you to take your nausea medication as directed.  BELOW ARE SYMPTOMS THAT SHOULD BE REPORTED IMMEDIATELY: *FEVER GREATER THAN 100.4 F (38 C) OR HIGHER *CHILLS OR SWEATING *NAUSEA AND VOMITING THAT IS NOT CONTROLLED WITH YOUR NAUSEA MEDICATION *UNUSUAL SHORTNESS OF BREATH *UNUSUAL BRUISING OR BLEEDING *URINARY PROBLEMS (pain or burning when urinating, or frequent urination) *BOWEL PROBLEMS (unusual diarrhea, constipation, pain near the anus) TENDERNESS IN MOUTH AND THROAT WITH OR WITHOUT PRESENCE OF ULCERS (sore throat, sores in mouth, or a toothache) UNUSUAL RASH, SWELLING OR PAIN  UNUSUAL VAGINAL DISCHARGE OR ITCHING   Items with * indicate a potential emergency and should be followed up as soon as possible or go to the Emergency Department if any problems should occur.  Please show the CHEMOTHERAPY ALERT CARD or IMMUNOTHERAPY ALERT CARD at  check-in to the Emergency Department and triage nurse.  Should you have questions after your visit or need to cancel or reschedule your appointment, please contact [facility]  Dept: [phone]  and follow the prompts.  Office hours are 8:00 a.m. to 4:30 p.m. Monday - Friday. Please note that voicemails left after 4:00 p.m. may not be returned until the following business day.  We are closed weekends and major holidays. You have access to a nurse at all times for urgent questions. Please call the main number to the clinic Dept: [phone] and follow the prompts.   For any non-urgent questions, you may also contact your provider using MyChart. We now offer e-Visits for anyone 53 and older to request care online for non-urgent symptoms. For details visit [email or web address].   Also download the MyChart app! Go to the app store, search "MyChart", open the app, select [facility], and log in with your MyChart username and password.  Masks are optional in the cancer centers. If you would like for your care team to wear a mask while they are taking care of you, please let them know. You may have one support person who is at least 75 years old accompany you for your appointments.  Panitumumab Injection What is this medication? PANITUMUMAB (pan i TOOM ue mab) treats colorectal cancer. It works by blocking a protein that causes cancer cells to grow and multiply. This helps to slow or stop the spread of cancer cells. It is a monoclonal antibody. This medicine may be used for other purposes; ask your health care provider or  pharmacist if you have questions. COMMON BRAND NAME(S): Vectibix What should I tell my care team before I take this medication? They need to know if you have any of these conditions: Eye disease Low levels of magnesium in the blood Lung disease An unusual or allergic reaction to panitumumab, other medications, foods, dyes, or preservatives Pregnant or  trying to get pregnant Breast-feeding How should I use this medication? This medication is injected into a vein. It is given by your care team in a hospital or clinic setting. Talk to your care team about the use of this medication in children. Special care may be needed. Overdosage: If you think you have taken too much of this medicine contact a poison control center or emergency room at once. NOTE: This medicine is only for you. Do not share this medicine with others. What if I miss a dose? Keep appointments for follow-up doses. It is important not to miss your dose. Call your care team if you are unable to keep an appointment. What may interact with this medication? Bevacizumab This list may not describe all possible interactions. Give your health care provider a list of all the medicines, herbs, non-prescription drugs, or dietary supplements you use. Also tell them if you smoke, drink alcohol, or use illegal drugs. Some items may interact with your medicine. What should I watch for while using this medication? Your condition will be monitored carefully while you are receiving this medication. This medication may make you feel generally unwell. This is not uncommon as chemotherapy can affect healthy cells as well as cancer cells. Report any side effects. Continue your course of treatment even though you feel ill unless your care team tells you to stop. This medication can make you more sensitive to the sun. Keep out of the sun while receiving this medication and for 2 months after stopping therapy. If you cannot avoid being in the sun, wear protective clothing and sunscreen. Do not use sun lamps, tanning beds, or tanning booths. Check with your care team if you have severe diarrhea, nausea, and vomiting or if you sweat a lot. The loss of too much body fluid may make it dangerous for you to take this medication. This medication may cause serious skin reactions. They can happen weeks to months  after starting the medication. Contact your care team right away if you notice fevers or flu-like symptoms with a rash. The rash may be red or purple and then turn into blisters or peeling of the skin. You may also notice a red rash with swelling of the face, lips, or lymph nodes in your neck or under your arms. Talk to your care team if you may be pregnant. Serious birth defects can occur if you take this medication during pregnancy and for 2 months after the last dose. Contraception is recommended while taking this medication and for 2 months after the last dose. Your care team can help you find the option that works for you. Do not breastfeed while taking this medication and for 2 months after the last dose. This medication may cause infertility. Talk to your care team if you are concerned about your fertility. What side effects may I notice from receiving this medication? Side effects that you should report to your care team as soon as possible: Allergic reactions--skin rash, itching, hives, swelling of the face, lips, tongue, or throat Dry cough, shortness of breath or trouble breathing Eye pain, redness, irritation, or discharge with blurry or decreased vision Infusion  reactions--chest pain, shortness of breath or trouble breathing, feeling faint or lightheaded Low magnesium level--muscle pain or cramps, unusual weakness or fatigue, fast or irregular heartbeat, tremors Low potassium level--muscle pain or cramps, unusual weakness or fatigue, fast or irregular heartbeat, constipation Redness, blistering, peeling, or loosening of the skin, including inside the mouth Skin reactions on sun-exposed areas Side effects that usually do not require medical attention (report to your care team if they continue or are bothersome): Change in nail shape, thickness, or color Diarrhea Dry skin Fatigue Nausea Vomiting This list may not describe all possible side effects. Call your doctor for medical advice  about side effects. You may report side effects to FDA at 1-800-FDA-1088. Where should I keep my medication? This medication is given in a hospital or clinic. It will not be stored at home. NOTE: This sheet is a summary. It may not cover all possible information. If you have questions about this medicine, talk to your doctor, pharmacist, or health care provider.  2023 Elsevier/Gold Standard (2021-09-04 00:00:00)  Magnesium Sulfate Injection What is this medication? MAGNESIUM SULFATE (mag NEE zee um SUL fate) prevents and treats low levels of magnesium in your body. It may also be used to prevent and treat seizures during pregnancy in people with high blood pressure disorders, such as preeclampsia or eclampsia. Magnesium plays an important role in maintaining the health of your muscles and nervous system. This medicine may be used for other purposes; ask your health care provider or pharmacist if you have questions. What should I tell my care team before I take this medication? They need to know if you have any of these conditions: Heart disease History of irregular heart beat Kidney disease An unusual or allergic reaction to magnesium sulfate, medications, foods, dyes, or preservatives Pregnant or trying to get pregnant Breast-feeding How should I use this medication? This medication is for infusion into a vein. It is given in a hospital or clinic setting. Talk to your care team about the use of this medication in children. While this medication may be prescribed for selected conditions, precautions do apply. Overdosage: If you think you have taken too much of this medicine contact a poison control center or emergency room at once. NOTE: This medicine is only for you. Do not share this medicine with others. What if I miss a dose? This does not apply. What may interact with this medication? Certain medications for anxiety or sleep Certain medications for seizures, such  phenobarbital Digoxin Medications that relax muscles for surgery Narcotic medications for pain This list may not describe all possible interactions. Give your health care provider a list of all the medicines, herbs, non-prescription drugs, or dietary supplements you use. Also tell them if you smoke, drink alcohol, or use illegal drugs. Some items may interact with your medicine. What should I watch for while using this medication? Your condition will be monitored carefully while you are receiving this medication. You may need blood work done while you are receiving this medication. What side effects may I notice from receiving this medication? Side effects that you should report to your care team as soon as possible: Allergic reactions--skin rash, itching, hives, swelling of the face, lips, tongue, or throat High magnesium level--confusion, drowsiness, facial flushing, redness, sweating, muscle weakness, fast or irregular heartbeat, trouble breathing Low blood pressure--dizziness, feeling faint or lightheaded, blurry vision Side effects that usually do not require medical attention (report to your care team if they continue or are bothersome): Headache Nausea  This list may not describe all possible side effects. Call your doctor for medical advice about side effects. You may report side effects to FDA at 1-800-FDA-1088. Where should I keep my medication? This medication is given in a hospital or clinic and will not be stored at home. NOTE: This sheet is a summary. It may not cover all possible information. If you have questions about this medicine, talk to your doctor, pharmacist, or health care provider.  2023 Elsevier/Gold Standard (2012-08-27 00:00:00)  Ferumoxytol Injection What is this medication? FERUMOXYTOL (FER ue MOX i tol) treats low levels of iron in your body (iron deficiency anemia). Iron is a mineral that plays an important role in making red blood cells, which carry oxygen from  your lungs to the rest of your body. This medicine may be used for other purposes; ask your health care provider or pharmacist if you have questions. COMMON BRAND NAME(S): Feraheme What should I tell my care team before I take this medication? They need to know if you have any of these conditions: Anemia not caused by low iron levels High levels of iron in the blood Magnetic resonance imaging (MRI) test scheduled An unusual or allergic reaction to iron, other medications, foods, dyes, or preservatives Pregnant or trying to get pregnant Breastfeeding How should I use this medication? This medication is injected into a vein. It is given by your care team in a hospital or clinic setting. Talk to your care team the use of this medication in children. Special care may be needed. Overdosage: If you think you have taken too much of this medicine contact a poison control center or emergency room at once. NOTE: This medicine is only for you. Do not share this medicine with others. What if I miss a dose? It is important not to miss your dose. Call your care team if you are unable to keep an appointment. What may interact with this medication? Other iron products This list may not describe all possible interactions. Give your health care provider a list of all the medicines, herbs, non-prescription drugs, or dietary supplements you use. Also tell them if you smoke, drink alcohol, or use illegal drugs. Some items may interact with your medicine. What should I watch for while using this medication? Visit your care team regularly. Tell your care team if your symptoms do not start to get better or if they get worse. You may need blood work done while you are taking this medication. You may need to follow a special diet. Talk to your care team. Foods that contain iron include: whole grains/cereals, dried fruits, beans, or peas, leafy green vegetables, and organ meats (liver, kidney). What side effects may I  notice from receiving this medication? Side effects that you should report to your care team as soon as possible: Allergic reactions--skin rash, itching, hives, swelling of the face, lips, tongue, or throat Low blood pressure--dizziness, feeling faint or lightheaded, blurry vision Shortness of breath Side effects that usually do not require medical attention (report to your care team if they continue or are bothersome): Flushing Headache Joint pain Muscle pain Nausea Pain, redness, or irritation at injection site This list may not describe all possible side effects. Call your doctor for medical advice about side effects. You may report side effects to FDA at 1-800-FDA-1088. Where should I keep my medication? This medication is given in a hospital or clinic and will not be stored at home. NOTE: This sheet is a summary. It may not  cover all possible information. If you have questions about this medicine, talk to your doctor, pharmacist, or health care provider.  2023 Elsevier/Gold Standard (2020-09-12 00:00:00)

## 2022-03-06 ENCOUNTER — Other Ambulatory Visit: Payer: Self-pay

## 2022-03-09 ENCOUNTER — Other Ambulatory Visit: Payer: Self-pay

## 2022-03-11 ENCOUNTER — Other Ambulatory Visit: Payer: Self-pay

## 2022-03-16 ENCOUNTER — Other Ambulatory Visit: Payer: Self-pay | Admitting: Oncology

## 2022-03-17 ENCOUNTER — Other Ambulatory Visit (HOSPITAL_BASED_OUTPATIENT_CLINIC_OR_DEPARTMENT_OTHER): Payer: Self-pay

## 2022-03-17 ENCOUNTER — Ambulatory Visit (HOSPITAL_BASED_OUTPATIENT_CLINIC_OR_DEPARTMENT_OTHER)
Admission: RE | Admit: 2022-03-17 | Discharge: 2022-03-17 | Disposition: A | Discharge status: Home / Self Care | Payer: Medicare Other | Source: Ambulatory Visit | Attending: Nurse Practitioner | Admitting: Nurse Practitioner

## 2022-03-17 ENCOUNTER — Encounter: Payer: Self-pay | Admitting: Oncology

## 2022-03-17 DIAGNOSIS — Z23 Encounter for immunization: Secondary | ICD-10-CM | POA: Diagnosis not present

## 2022-03-17 DIAGNOSIS — C189 Malignant neoplasm of colon, unspecified: Secondary | ICD-10-CM

## 2022-03-17 MED ORDER — COMIRNATY 30 MCG/0.3ML IM SUSY
PREFILLED_SYRINGE | INTRAMUSCULAR | 0 refills | Status: DC
  Filled 2022-03-17: qty 0.3, 1d supply, fill #0

## 2022-03-19 ENCOUNTER — Inpatient Hospital Stay: Payer: Medicare Other

## 2022-03-19 ENCOUNTER — Inpatient Hospital Stay (HOSPITAL_BASED_OUTPATIENT_CLINIC_OR_DEPARTMENT_OTHER): Payer: Medicare Other | Admitting: Oncology

## 2022-03-19 ENCOUNTER — Encounter: Payer: Self-pay | Admitting: *Deleted

## 2022-03-19 ENCOUNTER — Other Ambulatory Visit: Payer: Self-pay

## 2022-03-19 VITALS — BP 102/59 | HR 73 | Temp 98.1°F | Resp 18 | Ht 76.0 in | Wt 224.2 lb

## 2022-03-19 DIAGNOSIS — C189 Malignant neoplasm of colon, unspecified: Secondary | ICD-10-CM

## 2022-03-19 DIAGNOSIS — Z5112 Encounter for antineoplastic immunotherapy: Secondary | ICD-10-CM | POA: Diagnosis not present

## 2022-03-19 DIAGNOSIS — C187 Malignant neoplasm of sigmoid colon: Secondary | ICD-10-CM | POA: Diagnosis not present

## 2022-03-19 DIAGNOSIS — N189 Chronic kidney disease, unspecified: Secondary | ICD-10-CM | POA: Diagnosis not present

## 2022-03-19 DIAGNOSIS — R590 Localized enlarged lymph nodes: Secondary | ICD-10-CM | POA: Diagnosis not present

## 2022-03-19 DIAGNOSIS — D509 Iron deficiency anemia, unspecified: Secondary | ICD-10-CM | POA: Diagnosis not present

## 2022-03-19 DIAGNOSIS — C774 Secondary and unspecified malignant neoplasm of inguinal and lower limb lymph nodes: Secondary | ICD-10-CM | POA: Diagnosis not present

## 2022-03-19 LAB — CBC WITH DIFFERENTIAL (CANCER CENTER ONLY)
Abs Immature Granulocytes: 0.02 10*3/uL (ref 0.00–0.07)
Basophils Absolute: 0 10*3/uL (ref 0.0–0.1)
Basophils Relative: 1 %
Eosinophils Absolute: 0.2 10*3/uL (ref 0.0–0.5)
Eosinophils Relative: 4 %
HCT: 36.8 % — ABNORMAL LOW (ref 39.0–52.0)
Hemoglobin: 11.9 g/dL — ABNORMAL LOW (ref 13.0–17.0)
Immature Granulocytes: 0 %
Lymphocytes Relative: 24 %
Lymphs Abs: 1.1 10*3/uL (ref 0.7–4.0)
MCH: 28.6 pg (ref 26.0–34.0)
MCHC: 32.3 g/dL (ref 30.0–36.0)
MCV: 88.5 fL (ref 80.0–100.0)
Monocytes Absolute: 0.7 10*3/uL (ref 0.1–1.0)
Monocytes Relative: 14 %
Neutro Abs: 2.7 10*3/uL (ref 1.7–7.7)
Neutrophils Relative %: 57 %
Platelet Count: 156 10*3/uL (ref 150–400)
RBC: 4.16 MIL/uL — ABNORMAL LOW (ref 4.22–5.81)
RDW: 15.3 % (ref 11.5–15.5)
WBC Count: 4.8 10*3/uL (ref 4.0–10.5)
nRBC: 0 % (ref 0.0–0.2)

## 2022-03-19 LAB — CMP (CANCER CENTER ONLY)
ALT: 8 U/L (ref 0–44)
AST: 8 U/L — ABNORMAL LOW (ref 15–41)
Albumin: 3.6 g/dL (ref 3.5–5.0)
Alkaline Phosphatase: 58 U/L (ref 38–126)
Anion gap: 8 (ref 5–15)
BUN: 38 mg/dL — ABNORMAL HIGH (ref 8–23)
CO2: 19 mmol/L — ABNORMAL LOW (ref 22–32)
Calcium: 9 mg/dL (ref 8.9–10.3)
Chloride: 111 mmol/L (ref 98–111)
Creatinine: 2.51 mg/dL — ABNORMAL HIGH (ref 0.61–1.24)
GFR, Estimated: 26 mL/min — ABNORMAL LOW (ref >60)
Glucose, Bld: 147 mg/dL — ABNORMAL HIGH (ref 70–99)
Potassium: 4.4 mmol/L (ref 3.5–5.1)
Sodium: 138 mmol/L (ref 135–145)
Total Bilirubin: 0.4 mg/dL (ref 0.3–1.2)
Total Protein: 6.7 g/dL (ref 6.5–8.1)

## 2022-03-19 LAB — MAGNESIUM: Magnesium: 1.3 mg/dL — ABNORMAL LOW (ref 1.7–2.4)

## 2022-03-19 MED ORDER — SODIUM CHLORIDE 0.9% FLUSH
10.0000 mL | INTRAVENOUS | Status: DC | PRN
  Administered 2022-03-19: 10 mL

## 2022-03-19 MED ORDER — HEPARIN SOD (PORK) LOCK FLUSH 100 UNIT/ML IV SOLN
500.0000 [IU] | Freq: Once | INTRAVENOUS | Status: AC | PRN
  Administered 2022-03-19: 500 [IU]

## 2022-03-19 MED ORDER — SODIUM CHLORIDE 0.9 % IV SOLN: Freq: Once | INTRAVENOUS | Status: AC

## 2022-03-19 MED ORDER — SODIUM CHLORIDE 0.9 % IV SOLN
6.0000 mg/kg | Freq: Once | INTRAVENOUS | Status: AC
  Administered 2022-03-19: 600 mg via INTRAVENOUS
  Filled 2022-03-19: qty 10

## 2022-03-19 MED ORDER — LIDOCAINE-PRILOCAINE 2.5-2.5 % EX CREA: 1.0000 | TOPICAL_CREAM | CUTANEOUS | 2 refills | Status: DC | PRN

## 2022-03-19 MED ORDER — MAGNESIUM SULFATE 2 GM/50ML IV SOLN
2.0000 g | Freq: Once | INTRAVENOUS | Status: AC
  Administered 2022-03-19: 2 g via INTRAVENOUS
  Filled 2022-03-19: qty 50

## 2022-03-19 NOTE — Patient Instructions (Signed)
[place]   Discharge Instructions: Thank you for choosing [facility] to provide your oncology and hematology care.   If you have a lab appointment with the [facility], please go directly to the [facility] and check in at the registration area.   Wear comfortable clothing and clothing appropriate for easy access to any Portacath or PICC line.   We strive to give you quality time with your provider. You may need to reschedule your appointment if you arrive late (15 or more minutes).  Arriving late affects you and other patients whose appointments are after yours.  Also, if you miss three or more appointments without notifying the office, you may be dismissed from the clinic at the provider's discretion.      For prescription refill requests, have your pharmacy contact our office and allow 72 hours for refills to be completed.    Today you received the following chemotherapy and/or immunotherapy agents Vectibix      To help prevent nausea and vomiting after your treatment, we encourage you to take your nausea medication as directed.  BELOW ARE SYMPTOMS THAT SHOULD BE REPORTED IMMEDIATELY: *FEVER GREATER THAN 100.4 F (38 C) OR HIGHER *CHILLS OR SWEATING *NAUSEA AND VOMITING THAT IS NOT CONTROLLED WITH YOUR NAUSEA MEDICATION *UNUSUAL SHORTNESS OF BREATH *UNUSUAL BRUISING OR BLEEDING *URINARY PROBLEMS (pain or burning when urinating, or frequent urination) *BOWEL PROBLEMS (unusual diarrhea, constipation, pain near the anus) TENDERNESS IN MOUTH AND THROAT WITH OR WITHOUT PRESENCE OF ULCERS (sore throat, sores in mouth, or a toothache) UNUSUAL RASH, SWELLING OR PAIN  UNUSUAL VAGINAL DISCHARGE OR ITCHING   Items with * indicate a potential emergency and should be followed up as soon as possible or go to the Emergency Department if any problems should occur.  Please show the CHEMOTHERAPY ALERT CARD or IMMUNOTHERAPY ALERT CARD at check-in to the  Emergency Department and triage nurse.  Should you have questions after your visit or need to cancel or reschedule your appointment, please contact [facility]  Dept: [phone]  and follow the prompts.  Office hours are 8:00 a.m. to 4:30 p.m. Monday - Friday. Please note that voicemails left after 4:00 p.m. may not be returned until the following business day.  We are closed weekends and major holidays. You have access to a nurse at all times for urgent questions. Please call the main number to the clinic Dept: [phone] and follow the prompts.   For any non-urgent questions, you may also contact your provider using MyChart. We now offer e-Visits for anyone 7 and older to request care online for non-urgent symptoms. For details visit [email or web address].   Also download the MyChart app! Go to the app store, search "MyChart", open the app, select [facility], and log in with your MyChart username and password.  Masks are optional in the cancer centers. If you would like for your care team to wear a mask while they are taking care of you, please let them know. You may have one support person who is at least 75 years old accompany you for your appointments.  Panitumumab Injection What is this medication? PANITUMUMAB (pan i TOOM ue mab) treats colorectal cancer. It works by blocking a protein that causes cancer cells to grow and multiply. This helps to slow or stop the spread of cancer cells. It is a monoclonal antibody. This medicine may be used for other purposes; ask your health care provider or pharmacist  if you have questions. COMMON BRAND NAME(S): Vectibix What should I tell my care team before I take this medication? They need to know if you have any of these conditions: Eye disease Low levels of magnesium in the blood Lung disease An unusual or allergic reaction to panitumumab, other medications, foods, dyes, or preservatives Pregnant or trying to get  pregnant Breast-feeding How should I use this medication? This medication is injected into a vein. It is given by your care team in a hospital or clinic setting. Talk to your care team about the use of this medication in children. Special care may be needed. Overdosage: If you think you have taken too much of this medicine contact a poison control center or emergency room at once. NOTE: This medicine is only for you. Do not share this medicine with others. What if I miss a dose? Keep appointments for follow-up doses. It is important not to miss your dose. Call your care team if you are unable to keep an appointment. What may interact with this medication? Bevacizumab This list may not describe all possible interactions. Give your health care provider a list of all the medicines, herbs, non-prescription drugs, or dietary supplements you use. Also tell them if you smoke, drink alcohol, or use illegal drugs. Some items may interact with your medicine. What should I watch for while using this medication? Your condition will be monitored carefully while you are receiving this medication. This medication may make you feel generally unwell. This is not uncommon as chemotherapy can affect healthy cells as well as cancer cells. Report any side effects. Continue your course of treatment even though you feel ill unless your care team tells you to stop. This medication can make you more sensitive to the sun. Keep out of the sun while receiving this medication and for 2 months after stopping therapy. If you cannot avoid being in the sun, wear protective clothing and sunscreen. Do not use sun lamps, tanning beds, or tanning booths. Check with your care team if you have severe diarrhea, nausea, and vomiting or if you sweat a lot. The loss of too much body fluid may make it dangerous for you to take this medication. This medication may cause serious skin reactions. They can happen weeks to months after starting the  medication. Contact your care team right away if you notice fevers or flu-like symptoms with a rash. The rash may be red or purple and then turn into blisters or peeling of the skin. You may also notice a red rash with swelling of the face, lips, or lymph nodes in your neck or under your arms. Talk to your care team if you may be pregnant. Serious birth defects can occur if you take this medication during pregnancy and for 2 months after the last dose. Contraception is recommended while taking this medication and for 2 months after the last dose. Your care team can help you find the option that works for you. Do not breastfeed while taking this medication and for 2 months after the last dose. This medication may cause infertility. Talk to your care team if you are concerned about your fertility. What side effects may I notice from receiving this medication? Side effects that you should report to your care team as soon as possible: Allergic reactions--skin rash, itching, hives, swelling of the face, lips, tongue, or throat Dry cough, shortness of breath or trouble breathing Eye pain, redness, irritation, or discharge with blurry or decreased vision Infusion reactions--chest  pain, shortness of breath or trouble breathing, feeling faint or lightheaded Low magnesium level--muscle pain or cramps, unusual weakness or fatigue, fast or irregular heartbeat, tremors Low potassium level--muscle pain or cramps, unusual weakness or fatigue, fast or irregular heartbeat, constipation Redness, blistering, peeling, or loosening of the skin, including inside the mouth Skin reactions on sun-exposed areas Side effects that usually do not require medical attention (report to your care team if they continue or are bothersome): Change in nail shape, thickness, or color Diarrhea Dry skin Fatigue Nausea Vomiting This list may not describe all possible side effects. Call your doctor for medical advice about side effects.  You may report side effects to FDA at 1-800-FDA-1088. Where should I keep my medication? This medication is given in a hospital or clinic. It will not be stored at home. NOTE: This sheet is a summary. It may not cover all possible information. If you have questions about this medicine, talk to your doctor, pharmacist, or health care provider.  2023 Elsevier/Gold Standard (2021-09-04 00:00:00)  Hypomagnesemia Hypomagnesemia is a condition in which the level of magnesium in the blood is too low. Magnesium is a mineral that is found in many foods. It is used in many different processes in the body. Hypomagnesemia can affect every organ in the body. In severe cases, it can cause life-threatening problems. What are the causes? This condition may be caused by: Not getting enough magnesium in your diet or not having enough healthy foods to eat (malnutrition). Problems with magnesium absorption in the intestines. Dehydration. Excessive use of alcohol. Vomiting. Severe or long-term (chronic) diarrhea. Some medicines, including medicines that make you urinate more often (diuretics). Certain diseases, such as kidney disease, diabetes, celiac disease, and overactive thyroid. What are the signs or symptoms? Symptoms of this condition include: Loss of appetite, nausea, and vomiting. Involuntary shaking or trembling of a body part (tremor). Muscle weakness or tingling in the arms and legs. Sudden tightening of muscles (muscle spasms). Confusion. Psychiatric issues, such as: Depression and irritability. Psychosis. A feeling of fluttering of the heart (palpitations). Seizures. These symptoms are more severe if magnesium levels drop suddenly. How is this diagnosed? This condition may be diagnosed based on: Your symptoms and medical history. A physical exam. Blood and urine tests. How is this treated? Treatment depends on the cause and the severity of the condition. It may be treated by: Taking a  magnesium supplement. This can be taken in pill form. If the condition is severe, magnesium is usually given through an IV. Making changes to your diet. You may be directed to eat foods that have a lot of magnesium, such as green leafy vegetables, peas, beans, and nuts. Not drinking alcohol. If you are struggling not to drink, ask your health care provider for help. Follow these instructions at home: Eating and drinking     Make sure that your diet includes foods with magnesium. Foods that have a lot of magnesium in them include: Green leafy vegetables, such as spinach and broccoli. Beans and peas. Nuts and seeds, such as almonds and sunflower seeds. Whole grains, such as whole grain bread and fortified cereals. Drink fluids that contain salts and minerals (electrolytes), such as sports drinks, when you are active. Do not drink alcohol. General instructions Take over-the-counter and prescription medicines only as told by your health care provider. Take magnesium supplements as directed if your health care provider tells you to take them. Have your magnesium levels monitored as told by your health care provider.  Keep all follow-up visits. This is important. Contact a health care provider if: You get worse instead of better. Your symptoms return. Get help right away if: You develop severe muscle weakness. You have trouble breathing. You feel that your heart is racing. These symptoms may represent a serious problem that is an emergency. Do not wait to see if the symptoms will go away. Get medical help right away. Call your local emergency services (911 in the [place] Do not drive yourself to the hospital. Summary Hypomagnesemia is a condition in which the level of magnesium in the blood is too low. Hypomagnesemia can affect every organ in the body. Treatment may include eating more foods that contain magnesium, taking magnesium supplements, and not drinking alcohol. Have your magnesium  levels monitored as told by your health care provider. This information is not intended to replace advice given to you by your health care provider. Make sure you discuss any questions you have with your health care provider. Document Revised: 09/18/2020 Document Reviewed: 09/18/2020 Elsevier Patient Education  [place].

## 2022-03-19 NOTE — Progress Notes (Signed)
Patient seen by Dr. Benay Spice today  Vitals are within treatment parameters.  Labs reviewed by Dr. Benay Spice and are not all within treatment parameters. Creatinine 2.51 and Mg+ 1.3  Per physician team, patient is ready for treatment. Please note that modifications are being made to the treatment plan including : Will receive 2 grams Mg+ today and does not need iron treatment today.

## 2022-03-19 NOTE — Progress Notes (Signed)
Lidocaine-Prilocaine 2.5 % cream approved by patient's plan from 03/19/22-03/20/23.

## 2022-03-19 NOTE — Progress Notes (Signed)
Louis [name]   Diagnosis: Cancer  INTERVAL HISTORY:   Louis [name] continues treatment with panitumumab and encorafenib.  Minimal rash.  He has a "warts "at the right side of Louis face and left lower leg.  Good appetite.  He is working.  No significant diarrhea.  Objective:  Vital signs in last 24 hours:  Blood pressure (!) 102/59, pulse 73, temperature 98.1 F (36.7 C), temperature source Oral, resp. rate 18, height _0  (1.93 m), weight 224 lb 3.2 oz (101.7 kg), SpO2 100 %.    HEENT: No thrush or ulcers Lymphatics: 1 cm mobile left axillary node, no right axillary node.  3-4 cm left inguinal node. Resp: Lungs clear bilaterally Cardio: Regular rate and rhythm GI: No hepatosplenomegaly Vascular: No leg edema  Skin: Mild acne type rash over the face and trunk, raised scaled lesion at the righ auricular area and lateral left lower leg  Portacath/PICC-without erythema  Lab Results:  Lab Results  Component Value Date   WBC 4.8 03/19/2022   HGB 11.9 (L) 03/19/2022   HCT 36.8 (L) 03/19/2022   MCV 88.5 03/19/2022   PLT 156 03/19/2022   NEUTROABS 2.7 03/19/2022    CMP  Lab Results  Component Value Date   NA 138 03/19/2022   K 4.4 03/19/2022   CL 111 03/19/2022   CO2 19 (L) 03/19/2022   GLUCOSE 147 (H) 03/19/2022   BUN 38 (H) 03/19/2022   CREATININE 2.51 (H) 03/19/2022   CALCIUM 9.0 03/19/2022   PROT 6.7 03/19/2022   ALBUMIN 3.6 03/19/2022   AST 8 (L) 03/19/2022   ALT 8 03/19/2022   ALKPHOS 58 03/19/2022   BILITOT 0.4 03/19/2022   GFRNONAA 26 (L) 03/19/2022   GFRAA 28 (L) 09/20/2019    Lab Results  Component Value Date   CEA1 2.61 01/02/2021   CEA 2.88 12/26/2021    Lab Results  Component Value Date   INR 0.9 10/11/2020   LABPROT 12.6 10/11/2020    Imaging:  CT CHEST ABDOMEN PELVIS WO CONTRAST  Result Date: 03/18/2022 CLINICAL DATA:  Metastatic colorectal carcinoma. * Tracking Code: BO * EXAM: CT CHEST, ABDOMEN AND  PELVIS WITHOUT CONTRAST TECHNIQUE: Multidetector CT imaging of the chest, abdomen and pelvis was performed following the standard protocol without IV contrast. RADIATION DOSE REDUCTION: This exam was performed according to the departmental dose-optimization program which includes automated exposure control, adjustment of the mA and/or kV according to patient size and/or use of iterative reconstruction technique. COMPARISON:  FDG PET scan 12/18/2021, CT 10/28/2021 FINDINGS: CT CHEST FINDINGS CT CHEST FINDINGS Cardiovascular: Coronary artery calcification and aortic atherosclerotic calcification. Port in the anterior chest wall with tip in distal SVC. Mediastinum/Nodes: No mediastinal hilar adenopathy. No supraclavicular adenopathy. LEFT axial lymph node measuring 12 mm decreased from 15 mm on comparison FDG PET scan. This node was hypermetabolic on the scan. Lungs/Pleura: No suspicious pulmonary nodules. Normal pleural. Airways normal. Musculoskeletal: No aggressive osseous lesion. CT ABDOMEN AND PELVIS FINDINGS Hepatobiliary: No focal hepatic lesion on noncontrast exam. Normal gallbladder. Pancreas: Pancreas is normal. No ductal dilatation. No pancreatic inflammation. Spleen: Normal spleen Adrenals/urinary tract: Adrenal glands normal. Bilateral benign renal cysts. Ureters normal. The bladder is irregular patulous with posterior diverticulum. Stomach/Bowel: Stomach, duodenum small-bowel normal. Post partial RIGHT hemicolectomy anatomy. Ascending transverse and descending colon normal. Rectum normal. No small bowel lesion or small bowel obstruction identified on noncontrast CT. Vascular/Lymphatic: Abdominal aorta is normal caliber. There is no retroperitoneal or periportal lymphadenopathy. Enlarged  LEFT inguinal lymph nodes again noted. Example node measuring 17 mm (image 123/9 on the LEFT compares to 20 mm on PET-CT scan. No new adenopathy Reproductive: Prostate grossly normal on noncontrast CT. On comparison FDG  PET scan there was hypermetabolic activity in the posterior LEFT aspect of the prostate gland (image [id number] - PET-CT 12/18/2021) Other: peritoneal implant along the posterior wall the bladder measures 11 mm (image 16/series 2) decreased from 15 mm on PET-CT scan. This nodule was hypermetabolic. Other small mesenteric or peritoneal nodules identified on PET-CT scan are not readily evident on the noncontrast CT Musculoskeletal: No aggressive osseous lesion. IMPRESSION: Chest Impression: 1. Interval decrease in size of LEFT axial lymph node. 2. No evidence of disease progression in the thorax. Abdomen / Pelvis Impression: 1. Mesenteric implant along the posterior wall the bladder is decreased in size for comparison FDG PET scan. The other smaller mesenteric and peritoneal lesions identified on PET-CT scan are not evident. No evidence of small-bowel obstruction or lesion. 2. Decrease in size LEFT inguinal lymph node which was hypermetabolic on comparison exam. 3. Post RIGHT hemicolectomy without complication. 4. On comparison FDG PET scan from 12/19/2018, there is intense focal hypermetabolic activity in the posterior right aspect of the bladder. Recommend correlation with PSA level and if elevated recommend MRI of the prostate gland. These results will be called to the ordering clinician or representative by the Radiologist Assistant, and communication documented in the PACS or [employer]. Electronically Signed   By: Suzy Bouchard M.D.   On: 03/18/2022 12:18    Medications: I have reviewed the patient's current medications.   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), isolated mesenteric implant-resected, multiple tumor deposits Sigmoid/descending resection and creation of a descending colostomy 04/10/2016 MSI-stable, no loss of mismatch repair protein expression Foundation 1- BRAF V600E positive, MS-stable, intermediate tumor mutation burden, no RAS mutation CT abdomen/pelvis  04/02/2016-no evidence of distant metastatic disease Cycle 1 FOLFOX 05/21/2016 Cycle 2 FOLFOX 06/04/2016 Cycle 3 FOLFOX 06/18/2016 Cycle 4 FOLFOX 07/02/2016 (oxaliplatin held secondary to thrombocytopenia) Cycle 5 FOLFOX 07/16/2016 Cycle 6 FOLFOX 07/30/2016 Cycle 7 FOLFOX 08/13/2016 (oxaliplatin held and 5-FU dose reduced) Cycle 8 FOLFOX 09/03/2016 (oxaliplatin held secondary to neuropathy) Cycle 9 FOLFOX 09/17/2016 (oxaliplatin held secondary to neuropathy) Cycle 10 FOLFOX 10/02/2016 Cycle 11 FOLFOX 10/15/2016 (oxaliplatin eliminated from the regimen) Cycle 12 FOLFOX 10/30/2016 (oxaliplatin eliminated) CTs 05/12/2017-no evidence of recurrent disease, right inguinal hernia containing bladder Colonoscopy 07/21/2017, 3 polyps were removed from the descending and transverse colon, fragments of tubular and tubulovillous adenoma CTs 05/19/2018-no evidence of recurrent disease, status post hernia repair, right upper lobe pneumonia CTs 05/20/2019-resolution of right upper lobe pneumonia, no evidence of metastatic disease Colonoscopy 01/25/2020-multiple polyps removed-tubular adenomas, poor preparation, repeat colonoscopy recommended CTs neck, chest, and abdomen/pelvis 08/27/2020- new 2 centimeter left axillary node, new 1.9 cm left inguinal node chronic mildly prominent portacaval node Ultrasound biopsy of left inguinal node on 10/29/2020-metastatic adenocarcinoma consistent with colon adenocarcinoma PET 5/63/8937-[id number] hypermetabolic left inguinal and left axillary nodes, solitary focus of hypermetabolic activity in the right lower quadrant small bowel Cycle 1 FOLFOX 12/19/2020 Cycle 2 FOLFOX 01/02/2021, oxaliplatin dose reduced secondary to neutropenia and thrombocytopenia Cycle 3 FOLFOX 01/16/2021 Cycle 4 FOLFOX 01/30/2021 CTs 02/11/2021-no change in left inguinal and left axillary nodes, no evidence of disease progression, stable wall thickening involving a loop of distal ileum Cycle 1 FOLFIRI/Avastin  02/27/2021 Cycle 2 FOLFIRI/Avastin 03/13/2021 Cycle 3 FOLFIRI/Avastin 04/03/2021 Cycle 4 FOLFIRI 04/17/2021, Avastin held due to elevated urine  protein 05/07/2021-24-hour urine protein elevated 1562 Cycle 5 FOLFIRI 05/08/2021, Avastin held due to elevated urine protein, irinotecan dose reduced due to in general not feeling well after treatment CTs 05/17/2021-left axillary and left inguinal lymph nodes are smaller, no evidence of disease progression Cycle 6 FOLFIRI 05/22/2021, Avastin held due to proteinuria 06/03/2021 24-hour urine with 1.9 g of protein Cycle 7 FOLFIRI 06/12/2021, Avastin held due to proteinuria Cycle 8 FOLFIRI 07/03/2021, Avastin held due to proteinuria Cycle 9 FOLFIRI 07/24/2021, Avastin held due to proteinuria Cycle 10 FOLFIRI 08/19/2021, Avastin held due to proteinuria CTs 09/02/2021-no change in the left axillary, left inguinal, and portacaval lymph nodes.  No mention of the distal small bowel thickening Maintenance 5-fluorouracil 09/18/2021 CT abdomen/pelvis 10/28/2021-enlarged left inguinal lymph node, stable; stomach and small bowel grossly unremarkable. Maintenance 5-fluorouracil continued Colonoscopy 12/10/2021-terminal ileum with an ulcerated partially obstructing medium-sized mass approximately 15 to 20 cm from the IC valve, invasive adenocarcinoma, colonic type, moderately differentiated (grade 2) PET scan 12/18/2021-progression of disease in the peritoneal space.  2 intensely hypermetabolic nodes in the left axilla, one node is new from the prior, the other is reduced in size and metabolic activity; decrease in size and metabolic activity of left inguinal adenopathy; intense metabolic activity associate with a loop of small bowel in the right lower quadrant, no interval change; new small hypermetabolic peritoneal implant along the ventral peritoneal surface just right of midline; larger new peritoneal implant in the deep right pelvis; small new implant in the right mid  abdomen Encorafenib/Panitumumab 01/08/2022 CTs 03/17/2022-decrease size of mesenteric implant at the posterior bladder wall, small mesenteric peritoneal lesions identified on PET/CT or not evident.  Decrease size of left inguinal left axillary nodes, hypermetabolic activity noted at the anterior left prostate on comparison August 2023 PET   2.   Chronic renal insufficiency   3.   Hypertension   4.  Inflamed sebaceous cyst at the upper back-status post incision and drainage   5.  Thrombocytopenia secondary chemotherapy-oxaliplatin dose reduced beginning with cycle 3 FOLFOX, oxaliplatin held with cycle 4 and cycle 7 FOLFOX   6.  Oxaliplatin neuropathy-improved   7.  History of Mucositis secondary chemotherapy   8.  Symptoms of pneumonia January 2020-CT 05/19/2018 consistent with right upper lobe pneumonia, Levaquin prescribed 05/20/2018   9.  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  Anemia, progressive 10/29/2021 2 units packed red blood cells 10/31/2021 Ferritin low 10/29/2021-oral iron Ferritin low 01/22/2022   12.  Colonoscopy 12/10/2021-terminal ileum with an ulcerated partially obstructing medium-sized mass approximately 15 to 20 cm from the IC valve, invasive adenocarcinoma, colonic type, moderately differentiated (grade 2)     Disposition: Louis [name] peers unchanged.  He is tolerating the current treatment well.  The palpable lymph nodes are smaller and the restaging CTs are consistent with a response to therapy.  The plan is to continue panitumumab/encorafenib.  I reviewed the CT findings and images with Louis [name] and Louis [name].  There is a hypermetabolic focus in the prostate on the August PET.  We will check a PSA when he returns in 2 weeks.   Betsy Coder, MD  03/19/2022  10:11 AM

## 2022-03-20 ENCOUNTER — Other Ambulatory Visit: Payer: Self-pay

## 2022-04-01 ENCOUNTER — Other Ambulatory Visit: Payer: Self-pay

## 2022-04-02 ENCOUNTER — Inpatient Hospital Stay: Payer: Medicare Other

## 2022-04-02 ENCOUNTER — Telehealth: Payer: Self-pay | Admitting: *Deleted

## 2022-04-02 ENCOUNTER — Encounter: Payer: Self-pay | Admitting: *Deleted

## 2022-04-02 ENCOUNTER — Inpatient Hospital Stay (HOSPITAL_BASED_OUTPATIENT_CLINIC_OR_DEPARTMENT_OTHER): Payer: Medicare Other | Admitting: Oncology

## 2022-04-02 VITALS — BP 143/68 | HR 74 | Temp 98.1°F | Resp 18 | Ht 76.0 in | Wt 226.0 lb

## 2022-04-02 DIAGNOSIS — Z5112 Encounter for antineoplastic immunotherapy: Secondary | ICD-10-CM | POA: Diagnosis not present

## 2022-04-02 DIAGNOSIS — R590 Localized enlarged lymph nodes: Secondary | ICD-10-CM | POA: Diagnosis not present

## 2022-04-02 DIAGNOSIS — C189 Malignant neoplasm of colon, unspecified: Secondary | ICD-10-CM

## 2022-04-02 DIAGNOSIS — N343 Urethral syndrome, unspecified: Secondary | ICD-10-CM | POA: Diagnosis not present

## 2022-04-02 DIAGNOSIS — N189 Chronic kidney disease, unspecified: Secondary | ICD-10-CM | POA: Diagnosis not present

## 2022-04-02 DIAGNOSIS — C774 Secondary and unspecified malignant neoplasm of inguinal and lower limb lymph nodes: Secondary | ICD-10-CM | POA: Diagnosis not present

## 2022-04-02 DIAGNOSIS — C187 Malignant neoplasm of sigmoid colon: Secondary | ICD-10-CM | POA: Diagnosis not present

## 2022-04-02 DIAGNOSIS — D509 Iron deficiency anemia, unspecified: Secondary | ICD-10-CM | POA: Diagnosis not present

## 2022-04-02 LAB — MAGNESIUM: Magnesium: 1.3 mg/dL — ABNORMAL LOW (ref 1.7–2.4)

## 2022-04-02 LAB — URINALYSIS, COMPLETE (UACMP) WITH MICROSCOPIC
Bilirubin Urine: NEGATIVE
Glucose, UA: NEGATIVE mg/dL
Hgb urine dipstick: NEGATIVE
Ketones, ur: NEGATIVE mg/dL
Leukocytes,Ua: NEGATIVE
Nitrite: NEGATIVE
Specific Gravity, Urine: 1.015 (ref 1.005–1.030)
pH: 5 (ref 5.0–8.0)

## 2022-04-02 LAB — CMP (CANCER CENTER ONLY)
ALT: 6 U/L (ref 0–44)
AST: 8 U/L — ABNORMAL LOW (ref 15–41)
Albumin: 3.4 g/dL — ABNORMAL LOW (ref 3.5–5.0)
Alkaline Phosphatase: 62 U/L (ref 38–126)
Anion gap: 9 (ref 5–15)
BUN: 33 mg/dL — ABNORMAL HIGH (ref 8–23)
CO2: 21 mmol/L — ABNORMAL LOW (ref 22–32)
Calcium: 8.6 mg/dL — ABNORMAL LOW (ref 8.9–10.3)
Chloride: 111 mmol/L (ref 98–111)
Creatinine: 2.54 mg/dL — ABNORMAL HIGH (ref 0.61–1.24)
GFR, Estimated: 26 mL/min — ABNORMAL LOW (ref >60)
Glucose, Bld: 94 mg/dL (ref 70–99)
Potassium: 4.4 mmol/L (ref 3.5–5.1)
Sodium: 141 mmol/L (ref 135–145)
Total Bilirubin: 0.4 mg/dL (ref 0.3–1.2)
Total Protein: 6.4 g/dL — ABNORMAL LOW (ref 6.5–8.1)

## 2022-04-02 LAB — CBC WITH DIFFERENTIAL (CANCER CENTER ONLY)
Abs Immature Granulocytes: 0.04 10*3/uL (ref 0.00–0.07)
Basophils Absolute: 0 10*3/uL (ref 0.0–0.1)
Basophils Relative: 1 %
Eosinophils Absolute: 0.2 10*3/uL (ref 0.0–0.5)
Eosinophils Relative: 3 %
HCT: 32.9 % — ABNORMAL LOW (ref 39.0–52.0)
Hemoglobin: 10.5 g/dL — ABNORMAL LOW (ref 13.0–17.0)
Immature Granulocytes: 1 %
Lymphocytes Relative: 16 %
Lymphs Abs: 1.1 10*3/uL (ref 0.7–4.0)
MCH: 28.4 pg (ref 26.0–34.0)
MCHC: 31.9 g/dL (ref 30.0–36.0)
MCV: 88.9 fL (ref 80.0–100.0)
Monocytes Absolute: 0.9 10*3/uL (ref 0.1–1.0)
Monocytes Relative: 13 %
Neutro Abs: 4.7 10*3/uL (ref 1.7–7.7)
Neutrophils Relative %: 66 %
Platelet Count: 172 10*3/uL (ref 150–400)
RBC: 3.7 MIL/uL — ABNORMAL LOW (ref 4.22–5.81)
RDW: 15.7 % — ABNORMAL HIGH (ref 11.5–15.5)
WBC Count: 6.9 10*3/uL (ref 4.0–10.5)
nRBC: 0 % (ref 0.0–0.2)

## 2022-04-02 MED ORDER — SODIUM CHLORIDE 0.9 % IV SOLN: Freq: Once | INTRAVENOUS | Status: AC

## 2022-04-02 MED ORDER — MAGNESIUM SULFATE 2 GM/50ML IV SOLN
2.0000 g | Freq: Once | INTRAVENOUS | Status: AC
  Administered 2022-04-02: 2 g via INTRAVENOUS
  Filled 2022-04-02: qty 50

## 2022-04-02 MED ORDER — SODIUM CHLORIDE 0.9% FLUSH
10.0000 mL | INTRAVENOUS | Status: DC | PRN
  Administered 2022-04-02: 10 mL

## 2022-04-02 MED ORDER — HEPARIN SOD (PORK) LOCK FLUSH 100 UNIT/ML IV SOLN
500.0000 [IU] | Freq: Once | INTRAVENOUS | Status: AC | PRN
  Administered 2022-04-02: 500 [IU]

## 2022-04-02 MED ORDER — SODIUM CHLORIDE 0.9 % IV SOLN
6.0000 mg/kg | Freq: Once | INTRAVENOUS | Status: AC
  Administered 2022-04-02: 600 mg via INTRAVENOUS
  Filled 2022-04-02: qty 20

## 2022-04-02 NOTE — Telephone Encounter (Signed)
-----   Message from Ladell Pier, MD sent at 04/02/2022  3:23 PM EST ----- Please call patient, urine has a few white cells, may have a UTI, will wait on culture, call for urinary symptoms

## 2022-04-02 NOTE — Progress Notes (Signed)
[facility] OFFICE PROGRESS NOTE   Diagnosis: Colon cancer  INTERVAL HISTORY:   [name] continues to move them and encorafenib.  No mouth sores or diarrhea.  He has a persistent rash over the head and chest.  He continues doxycycline.  The palpable lymph nodes are smaller.  Good appetite.  He is working.  He reports "pink "tinged urine on his underpants.  Objective:  Vital signs in last 24 hours:  Blood pressure (!) 143/68, pulse 74, temperature 98.1 F (36.7 C), temperature source Oral, resp. rate 18, height _0  (1.93 m), weight 226 lb (102.5 kg), SpO2 100 %.    HEENT: No thrush or ulcers Lymphatics: 1 cm high left axillary node, 1/2 cm lower left axillary node, no right axillary or right inguinal nodes.  3-4 cm left inguinal node Resp: Lungs clear bilaterally GI: No hepatosplenomegaly, nontender Vascular: No leg edema Cardiac: Regular rate and rhythm  Skin: Acne type rash over the face and anterior chest  Portacath/PICC-without erythema  Lab Results:  Lab Results  Component Value Date   WBC 6.9 04/02/2022   HGB 10.5 (L) 04/02/2022   HCT 32.9 (L) 04/02/2022   MCV 88.9 04/02/2022   PLT 172 04/02/2022   NEUTROABS 4.7 04/02/2022    CMP  Lab Results  Component Value Date   NA 138 03/19/2022   K 4.4 03/19/2022   CL 111 03/19/2022   CO2 19 (L) 03/19/2022   GLUCOSE 147 (H) 03/19/2022   BUN 38 (H) 03/19/2022   CREATININE 2.51 (H) 03/19/2022   CALCIUM 9.0 03/19/2022   PROT 6.7 03/19/2022   ALBUMIN 3.6 03/19/2022   AST 8 (L) 03/19/2022   ALT 8 03/19/2022   ALKPHOS 58 03/19/2022   BILITOT 0.4 03/19/2022   GFRNONAA 26 (L) 03/19/2022   GFRAA 28 (L) 09/20/2019    Lab Results  Component Value Date   CEA1 2.61 01/02/2021   CEA 2.88 12/26/2021    Medications: I have reviewed the patient's current medications.   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), isolated mesenteric implant-resected, multiple tumor  deposits Sigmoid/descending resection and creation of a descending colostomy 04/10/2016 MSI-stable, no loss of mismatch repair protein expression Foundation 1- BRAF V600E positive, MS-stable, intermediate tumor mutation burden, no RAS mutation CT abdomen/pelvis 04/02/2016-no evidence of distant metastatic disease Cycle 1 FOLFOX 05/21/2016 Cycle 2 FOLFOX 06/04/2016 Cycle 3 FOLFOX 06/18/2016 Cycle 4 FOLFOX 07/02/2016 (oxaliplatin held secondary to thrombocytopenia) Cycle 5 FOLFOX 07/16/2016 Cycle 6 FOLFOX 07/30/2016 Cycle 7 FOLFOX 08/13/2016 (oxaliplatin held and 5-FU dose reduced) Cycle 8 FOLFOX 09/03/2016 (oxaliplatin held secondary to neuropathy) Cycle 9 FOLFOX 09/17/2016 (oxaliplatin held secondary to neuropathy) Cycle 10 FOLFOX 10/02/2016 Cycle 11 FOLFOX 10/15/2016 (oxaliplatin eliminated from the regimen) Cycle 12 FOLFOX 10/30/2016 (oxaliplatin eliminated) CTs 05/12/2017-no evidence of recurrent disease, right inguinal hernia containing bladder Colonoscopy 07/21/2017, 3 polyps were removed from the descending and transverse colon, fragments of tubular and tubulovillous adenoma CTs 05/19/2018-no evidence of recurrent disease, status post hernia repair, right upper lobe pneumonia CTs 05/20/2019-resolution of right upper lobe pneumonia, no evidence of metastatic disease Colonoscopy 01/25/2020-multiple polyps removed-tubular adenomas, poor preparation, repeat colonoscopy recommended CTs neck, chest, and abdomen/pelvis 08/27/2020- new 2 centimeter left axillary node, new 1.9 cm left inguinal node chronic mildly prominent portacaval node Ultrasound biopsy of left inguinal node on 10/29/2020-metastatic adenocarcinoma consistent with colon adenocarcinoma PET 4/40/[id number] hypermetabolic left inguinal and left axillary nodes, solitary focus of hypermetabolic activity in the right lower quadrant small bowel Cycle 1 FOLFOX 12/19/2020  Cycle 2 FOLFOX 01/02/2021, oxaliplatin dose reduced secondary to  neutropenia and thrombocytopenia Cycle 3 FOLFOX 01/16/2021 Cycle 4 FOLFOX 01/30/2021 CTs 02/11/2021-no change in left inguinal and left axillary nodes, no evidence of disease progression, stable wall thickening involving a loop of distal ileum Cycle 1 FOLFIRI/Avastin 02/27/2021 Cycle 2 FOLFIRI/Avastin 03/13/2021 Cycle 3 FOLFIRI/Avastin 04/03/2021 Cycle 4 FOLFIRI 04/17/2021, Avastin held due to elevated urine protein 05/07/2021-24-hour urine protein elevated 1562 Cycle 5 FOLFIRI 05/08/2021, Avastin held due to elevated urine protein, irinotecan dose reduced due to in general not feeling well after treatment CTs 05/17/2021-left axillary and left inguinal lymph nodes are smaller, no evidence of disease progression Cycle 6 FOLFIRI 05/22/2021, Avastin held due to proteinuria 06/03/2021 24-hour urine with 1.9 g of protein Cycle 7 FOLFIRI 06/12/2021, Avastin held due to proteinuria Cycle 8 FOLFIRI 07/03/2021, Avastin held due to proteinuria Cycle 9 FOLFIRI 07/24/2021, Avastin held due to proteinuria Cycle 10 FOLFIRI 08/19/2021, Avastin held due to proteinuria CTs 09/02/2021-no change in the left axillary, left inguinal, and portacaval lymph nodes.  No mention of the distal small bowel thickening Maintenance 5-fluorouracil 09/18/2021 CT abdomen/pelvis 10/28/2021-enlarged left inguinal lymph node, stable; stomach and small bowel grossly unremarkable. Maintenance 5-fluorouracil continued Colonoscopy 12/10/2021-terminal ileum with an ulcerated partially obstructing medium-sized mass approximately 15 to 20 cm from the IC valve, invasive adenocarcinoma, colonic type, moderately differentiated (grade 2) PET scan 12/18/2021-progression of disease in the peritoneal space.  2 intensely hypermetabolic nodes in the left axilla, one node is new from the prior, the other is reduced in size and metabolic activity; decrease in size and metabolic activity of left inguinal adenopathy; intense metabolic activity associate with a loop of small  bowel in the right lower quadrant, no interval change; new small hypermetabolic peritoneal implant along the ventral peritoneal surface just right of midline; larger new peritoneal implant in the deep right pelvis; small new implant in the right mid abdomen Encorafenib/Panitumumab 01/08/2022 CTs 03/17/2022-decrease size of mesenteric implant at the posterior bladder wall, small mesenteric peritoneal lesions identified on PET/CT or not evident.  Decrease size of left inguinal left axillary nodes, hypermetabolic activity noted at the anterior left prostate on comparison August 2023 PET   2.   Chronic renal insufficiency   3.   Hypertension   4.  Inflamed sebaceous cyst at the upper back-status post incision and drainage   5.  Thrombocytopenia secondary chemotherapy-oxaliplatin dose reduced beginning with cycle 3 FOLFOX, oxaliplatin held with cycle 4 and cycle 7 FOLFOX   6.  Oxaliplatin neuropathy-improved   7.  History of Mucositis secondary chemotherapy   8.  Symptoms of pneumonia January 2020-CT 05/19/2018 consistent with right upper lobe pneumonia, Levaquin prescribed 05/20/2018   9.  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  Anemia, progressive 10/29/2021 2 units packed red blood cells 10/31/2021 Ferritin low 10/29/2021-oral iron Ferritin low 01/22/2022   12.  Colonoscopy 12/10/2021-terminal ileum with an ulcerated partially obstructing medium-sized mass approximately 15 to 20 cm from the IC valve, invasive adenocarcinoma, colonic type, moderately differentiated (grade 2)       Disposition: Mr Brine appears stable.  He will continue encorafenib and panitumumab.  He will be scheduled for restaging CT in a few months.  We will check a PSA based on the PET finding in August.  We will check a urinalysis and culture today.  Mr Faulkenberry will return for an office visit and treatment in 2 weeks.  Betsy Coder, MD  04/02/2022  10:42  AM

## 2022-04-02 NOTE — Telephone Encounter (Signed)
Made Mr. Alleva aware of some WBC in urine. MD will await urine culture results before deciding on treatment.

## 2022-04-02 NOTE — Progress Notes (Signed)
Patient seen by Dr. Benay Spice today  Vitals are within treatment parameters.  Labs reviewed by Dr. Benay Spice and are not all within treatment parameters. Creatinine 2.54 and Mg+ 1.3 --OK to proceed with treatment today.  Per physician team, patient is ready for treatment. Please note that modifications are being made to the treatment plan including Will receive 2 grams Mg+ today.

## 2022-04-02 NOTE — Patient Instructions (Signed)
[facility]   Discharge Instructions: Thank you for choosing [facility] to provide your oncology and hematology care.   If you have a lab appointment with the [facility], please go directly to the [facility] and check in at the registration area.   Wear comfortable clothing and clothing appropriate for easy access to any Portacath or PICC line.   We strive to give you quality time with your provider. You may need to reschedule your appointment if you arrive late (15 or more minutes).  Arriving late affects you and other patients whose appointments are after yours.  Also, if you miss three or more appointments without notifying the office, you may be dismissed from the clinic at the provider's discretion.      For prescription refill requests, have your pharmacy contact our office and allow 72 hours for refills to be completed.    Today you received the following chemotherapy and/or immunotherapy agents Vectibix      To help prevent nausea and vomiting after your treatment, we encourage you to take your nausea medication as directed.  BELOW ARE SYMPTOMS THAT SHOULD BE REPORTED IMMEDIATELY: *FEVER GREATER THAN 100.4 F (38 C) OR HIGHER *CHILLS OR SWEATING *NAUSEA AND VOMITING THAT IS NOT CONTROLLED WITH YOUR NAUSEA MEDICATION *UNUSUAL SHORTNESS OF BREATH *UNUSUAL BRUISING OR BLEEDING *URINARY PROBLEMS (pain or burning when urinating, or frequent urination) *BOWEL PROBLEMS (unusual diarrhea, constipation, pain near the anus) TENDERNESS IN MOUTH AND THROAT WITH OR WITHOUT PRESENCE OF ULCERS (sore throat, sores in mouth, or a toothache) UNUSUAL RASH, SWELLING OR PAIN  UNUSUAL VAGINAL DISCHARGE OR ITCHING   Items with * indicate a potential emergency and should be followed up as soon as possible or go to the Emergency Department if any problems should occur.  Please show the CHEMOTHERAPY ALERT CARD or IMMUNOTHERAPY ALERT CARD at check-in to the  Emergency Department and triage nurse.  Should you have questions after your visit or need to cancel or reschedule your appointment, please contact [facility]  Dept: [phone]  and follow the prompts.  Office hours are 8:00 a.m. to 4:30 p.m. Monday - Friday. Please note that voicemails left after 4:00 p.m. may not be returned until the following business day.  We are closed weekends and major holidays. You have access to a nurse at all times for urgent questions. Please call the main number to the clinic Dept: [phone] and follow the prompts.   For any non-urgent questions, you may also contact your provider using MyChart. We now offer e-Visits for anyone 58 and older to request care online for non-urgent symptoms. For details visit [email or web address].   Also download the MyChart app! Go to the app store, search "MyChart", open the app, select [facility], and log in with your MyChart username and password.  Masks are optional in the cancer centers. If you would like for your care team to wear a mask while they are taking care of you, please let them know. You may have one support person who is at least 75 years old accompany you for your appointments.  Panitumumab Injection What is this medication? PANITUMUMAB (pan i TOOM ue mab) treats colorectal cancer. It works by blocking a protein that causes cancer cells to grow and multiply. This helps to slow or stop the spread of cancer cells. It is a monoclonal antibody. This medicine may be used for other purposes; ask your health care provider or pharmacist  if you have questions. COMMON BRAND NAME(S): Vectibix What should I tell my care team before I take this medication? They need to know if you have any of these conditions: Eye disease Low levels of magnesium in the blood Lung disease An unusual or allergic reaction to panitumumab, other medications, foods, dyes, or preservatives Pregnant or trying to get  pregnant Breast-feeding How should I use this medication? This medication is injected into a vein. It is given by your care team in a hospital or clinic setting. Talk to your care team about the use of this medication in children. Special care may be needed. Overdosage: If you think you have taken too much of this medicine contact a poison control center or emergency room at once. NOTE: This medicine is only for you. Do not share this medicine with others. What if I miss a dose? Keep appointments for follow-up doses. It is important not to miss your dose. Call your care team if you are unable to keep an appointment. What may interact with this medication? Bevacizumab This list may not describe all possible interactions. Give your health care provider a list of all the medicines, herbs, non-prescription drugs, or dietary supplements you use. Also tell them if you smoke, drink alcohol, or use illegal drugs. Some items may interact with your medicine. What should I watch for while using this medication? Your condition will be monitored carefully while you are receiving this medication. This medication may make you feel generally unwell. This is not uncommon as chemotherapy can affect healthy cells as well as cancer cells. Report any side effects. Continue your course of treatment even though you feel ill unless your care team tells you to stop. This medication can make you more sensitive to the sun. Keep out of the sun while receiving this medication and for 2 months after stopping therapy. If you cannot avoid being in the sun, wear protective clothing and sunscreen. Do not use sun lamps, tanning beds, or tanning booths. Check with your care team if you have severe diarrhea, nausea, and vomiting or if you sweat a lot. The loss of too much body fluid may make it dangerous for you to take this medication. This medication may cause serious skin reactions. They can happen weeks to months after starting the  medication. Contact your care team right away if you notice fevers or flu-like symptoms with a rash. The rash may be red or purple and then turn into blisters or peeling of the skin. You may also notice a red rash with swelling of the face, lips, or lymph nodes in your neck or under your arms. Talk to your care team if you may be pregnant. Serious birth defects can occur if you take this medication during pregnancy and for 2 months after the last dose. Contraception is recommended while taking this medication and for 2 months after the last dose. Your care team can help you find the option that works for you. Do not breastfeed while taking this medication and for 2 months after the last dose. This medication may cause infertility. Talk to your care team if you are concerned about your fertility. What side effects may I notice from receiving this medication? Side effects that you should report to your care team as soon as possible: Allergic reactions--skin rash, itching, hives, swelling of the face, lips, tongue, or throat Dry cough, shortness of breath or trouble breathing Eye pain, redness, irritation, or discharge with blurry or decreased vision Infusion reactions--chest  pain, shortness of breath or trouble breathing, feeling faint or lightheaded Low magnesium level--muscle pain or cramps, unusual weakness or fatigue, fast or irregular heartbeat, tremors Low potassium level--muscle pain or cramps, unusual weakness or fatigue, fast or irregular heartbeat, constipation Redness, blistering, peeling, or loosening of the skin, including inside the mouth Skin reactions on sun-exposed areas Side effects that usually do not require medical attention (report to your care team if they continue or are bothersome): Change in nail shape, thickness, or color Diarrhea Dry skin Fatigue Nausea Vomiting This list may not describe all possible side effects. Call your doctor for medical advice about side effects.  You may report side effects to FDA at 1-800-FDA-1088. Where should I keep my medication? This medication is given in a hospital or clinic. It will not be stored at home. NOTE: This sheet is a summary. It may not cover all possible information. If you have questions about this medicine, talk to your doctor, pharmacist, or health care provider.  2023 Elsevier/Gold Standard (2021-09-04 00:00:00)  Hypomagnesemia Hypomagnesemia is a condition in which the level of magnesium in the blood is too low. Magnesium is a mineral that is found in many foods. It is used in many different processes in the body. Hypomagnesemia can affect every organ in the body. In severe cases, it can cause life-threatening problems. What are the causes? This condition may be caused by: Not getting enough magnesium in your diet or not having enough healthy foods to eat (malnutrition). Problems with magnesium absorption in the intestines. Dehydration. Excessive use of alcohol. Vomiting. Severe or long-term (chronic) diarrhea. Some medicines, including medicines that make you urinate more often (diuretics). Certain diseases, such as kidney disease, diabetes, celiac disease, and overactive thyroid. What are the signs or symptoms? Symptoms of this condition include: Loss of appetite, nausea, and vomiting. Involuntary shaking or trembling of a body part (tremor). Muscle weakness or tingling in the arms and legs. Sudden tightening of muscles (muscle spasms). Confusion. Psychiatric issues, such as: Depression and irritability. Psychosis. A feeling of fluttering of the heart (palpitations). Seizures. These symptoms are more severe if magnesium levels drop suddenly. How is this diagnosed? This condition may be diagnosed based on: Your symptoms and medical history. A physical exam. Blood and urine tests. How is this treated? Treatment depends on the cause and the severity of the condition. It may be treated by: Taking a  magnesium supplement. This can be taken in pill form. If the condition is severe, magnesium is usually given through an IV. Making changes to your diet. You may be directed to eat foods that have a lot of magnesium, such as green leafy vegetables, peas, beans, and nuts. Not drinking alcohol. If you are struggling not to drink, ask your health care provider for help. Follow these instructions at home: Eating and drinking     Make sure that your diet includes foods with magnesium. Foods that have a lot of magnesium in them include: Green leafy vegetables, such as spinach and broccoli. Beans and peas. Nuts and seeds, such as almonds and sunflower seeds. Whole grains, such as whole grain bread and fortified cereals. Drink fluids that contain salts and minerals (electrolytes), such as sports drinks, when you are active. Do not drink alcohol. General instructions Take over-the-counter and prescription medicines only as told by your health care provider. Take magnesium supplements as directed if your health care provider tells you to take them. Have your magnesium levels monitored as told by your health care provider.  Keep all follow-up visits. This is important. Contact a health care provider if: You get worse instead of better. Your symptoms return. Get help right away if: You develop severe muscle weakness. You have trouble breathing. You feel that your heart is racing. These symptoms may represent a serious problem that is an emergency. Do not wait to see if the symptoms will go away. Get medical help right away. Call your local emergency services (911 in the [place] Do not drive yourself to the hospital. Summary Hypomagnesemia is a condition in which the level of magnesium in the blood is too low. Hypomagnesemia can affect every organ in the body. Treatment may include eating more foods that contain magnesium, taking magnesium supplements, and not drinking alcohol. Have your magnesium  levels monitored as told by your health care provider. This information is not intended to replace advice given to you by your health care provider. Make sure you discuss any questions you have with your health care provider. Document Revised: 09/18/2020 Document Reviewed: 09/18/2020 Elsevier Patient Education  [employer] [place].

## 2022-04-03 ENCOUNTER — Other Ambulatory Visit: Payer: Self-pay

## 2022-04-03 ENCOUNTER — Telehealth: Payer: Self-pay

## 2022-04-03 MED ORDER — AMOXICILLIN 500 MG PO CAPS: 500.0000 mg | ORAL_CAPSULE | Freq: Three times a day (TID) | ORAL | 0 refills | Status: AC

## 2022-04-03 NOTE — Telephone Encounter (Signed)
Patient notified of test results. Verbalizes understanding and agrees to the plan. Will call for further concerns.   ok to take concurrently with doxy per Dr Benay Spice

## 2022-04-03 NOTE — Telephone Encounter (Signed)
-----   Message from Ladell Pier, MD sent at 04/03/2022  3:23 PM EST ----- Please call patient urine culture is positive for Enterococcus, start amoxicillin 500 mg 3 times daily for 5 days, follow-up sensitivity results as we may need to change antibiotics

## 2022-04-04 ENCOUNTER — Other Ambulatory Visit: Payer: Self-pay

## 2022-04-04 LAB — PROSTATE-SPECIFIC AG, SERUM (LABCORP): Prostate Specific Ag, Serum: 4.2 ng/mL — ABNORMAL HIGH (ref 0.0–4.0)

## 2022-04-05 LAB — URINE CULTURE: Culture: 60000 — AB

## 2022-04-07 ENCOUNTER — Other Ambulatory Visit: Payer: Self-pay

## 2022-04-16 ENCOUNTER — Inpatient Hospital Stay (HOSPITAL_BASED_OUTPATIENT_CLINIC_OR_DEPARTMENT_OTHER): Payer: Medicare Other | Admitting: Nurse Practitioner

## 2022-04-16 ENCOUNTER — Encounter: Payer: Self-pay | Admitting: Nurse Practitioner

## 2022-04-16 ENCOUNTER — Inpatient Hospital Stay: Payer: Medicare Other

## 2022-04-16 ENCOUNTER — Inpatient Hospital Stay: Payer: Medicare Other | Attending: Oncology

## 2022-04-16 ENCOUNTER — Encounter: Payer: Self-pay | Admitting: *Deleted

## 2022-04-16 VITALS — BP 117/58 | HR 65 | Temp 98.1°F | Resp 18 | Ht 76.0 in | Wt 225.6 lb

## 2022-04-16 DIAGNOSIS — R809 Proteinuria, unspecified: Secondary | ICD-10-CM | POA: Diagnosis not present

## 2022-04-16 DIAGNOSIS — G629 Polyneuropathy, unspecified: Secondary | ICD-10-CM | POA: Diagnosis not present

## 2022-04-16 DIAGNOSIS — C189 Malignant neoplasm of colon, unspecified: Secondary | ICD-10-CM | POA: Diagnosis not present

## 2022-04-16 DIAGNOSIS — C187 Malignant neoplasm of sigmoid colon: Secondary | ICD-10-CM | POA: Diagnosis not present

## 2022-04-16 DIAGNOSIS — L723 Sebaceous cyst: Secondary | ICD-10-CM | POA: Diagnosis not present

## 2022-04-16 DIAGNOSIS — K1379 Other lesions of oral mucosa: Secondary | ICD-10-CM | POA: Diagnosis not present

## 2022-04-16 DIAGNOSIS — D6959 Other secondary thrombocytopenia: Secondary | ICD-10-CM | POA: Diagnosis not present

## 2022-04-16 DIAGNOSIS — R590 Localized enlarged lymph nodes: Secondary | ICD-10-CM | POA: Insufficient documentation

## 2022-04-16 DIAGNOSIS — R8290 Unspecified abnormal findings in urine: Secondary | ICD-10-CM

## 2022-04-16 DIAGNOSIS — C774 Secondary and unspecified malignant neoplasm of inguinal and lower limb lymph nodes: Secondary | ICD-10-CM | POA: Diagnosis not present

## 2022-04-16 DIAGNOSIS — Z5112 Encounter for antineoplastic immunotherapy: Secondary | ICD-10-CM | POA: Insufficient documentation

## 2022-04-16 DIAGNOSIS — D649 Anemia, unspecified: Secondary | ICD-10-CM | POA: Insufficient documentation

## 2022-04-16 DIAGNOSIS — I129 Hypertensive chronic kidney disease with stage 1 through stage 4 chronic kidney disease, or unspecified chronic kidney disease: Secondary | ICD-10-CM | POA: Diagnosis not present

## 2022-04-16 DIAGNOSIS — N189 Chronic kidney disease, unspecified: Secondary | ICD-10-CM | POA: Diagnosis not present

## 2022-04-16 DIAGNOSIS — Z79899 Other long term (current) drug therapy: Secondary | ICD-10-CM | POA: Diagnosis not present

## 2022-04-16 LAB — CBC WITH DIFFERENTIAL (CANCER CENTER ONLY)
Abs Immature Granulocytes: 0.03 10*3/uL (ref 0.00–0.07)
Basophils Absolute: 0.1 10*3/uL (ref 0.0–0.1)
Basophils Relative: 1 %
Eosinophils Absolute: 0.2 10*3/uL (ref 0.0–0.5)
Eosinophils Relative: 3 %
HCT: 33.7 % — ABNORMAL LOW (ref 39.0–52.0)
Hemoglobin: 10.8 g/dL — ABNORMAL LOW (ref 13.0–17.0)
Immature Granulocytes: 0 %
Lymphocytes Relative: 14 %
Lymphs Abs: 1 10*3/uL (ref 0.7–4.0)
MCH: 28.1 pg (ref 26.0–34.0)
MCHC: 32 g/dL (ref 30.0–36.0)
MCV: 87.5 fL (ref 80.0–100.0)
Monocytes Absolute: 0.7 10*3/uL (ref 0.1–1.0)
Monocytes Relative: 10 %
Neutro Abs: 4.9 10*3/uL (ref 1.7–7.7)
Neutrophils Relative %: 72 %
Platelet Count: 191 10*3/uL (ref 150–400)
RBC: 3.85 MIL/uL — ABNORMAL LOW (ref 4.22–5.81)
RDW: 15.9 % — ABNORMAL HIGH (ref 11.5–15.5)
WBC Count: 6.8 10*3/uL (ref 4.0–10.5)
nRBC: 0 % (ref 0.0–0.2)

## 2022-04-16 LAB — URINALYSIS, COMPLETE (UACMP) WITH MICROSCOPIC
Bilirubin Urine: NEGATIVE
Glucose, UA: NEGATIVE mg/dL
Hgb urine dipstick: NEGATIVE
Ketones, ur: NEGATIVE mg/dL
Leukocytes,Ua: NEGATIVE
Nitrite: NEGATIVE
Specific Gravity, Urine: 1.013 (ref 1.005–1.030)
pH: 5 (ref 5.0–8.0)

## 2022-04-16 LAB — CMP (CANCER CENTER ONLY)
ALT: 5 U/L (ref 0–44)
AST: 7 U/L — ABNORMAL LOW (ref 15–41)
Albumin: 3.5 g/dL (ref 3.5–5.0)
Alkaline Phosphatase: 58 U/L (ref 38–126)
Anion gap: 9 (ref 5–15)
BUN: 29 mg/dL — ABNORMAL HIGH (ref 8–23)
CO2: 19 mmol/L — ABNORMAL LOW (ref 22–32)
Calcium: 8.6 mg/dL — ABNORMAL LOW (ref 8.9–10.3)
Chloride: 110 mmol/L (ref 98–111)
Creatinine: 2.23 mg/dL — ABNORMAL HIGH (ref 0.61–1.24)
GFR, Estimated: 30 mL/min — ABNORMAL LOW (ref >60)
Glucose, Bld: 155 mg/dL — ABNORMAL HIGH (ref 70–99)
Potassium: 4.4 mmol/L (ref 3.5–5.1)
Sodium: 138 mmol/L (ref 135–145)
Total Bilirubin: 0.4 mg/dL (ref 0.3–1.2)
Total Protein: 6.5 g/dL (ref 6.5–8.1)

## 2022-04-16 LAB — MAGNESIUM: Magnesium: 1.3 mg/dL — ABNORMAL LOW (ref 1.7–2.4)

## 2022-04-16 MED ORDER — SODIUM CHLORIDE 0.9 % IV SOLN
6.0000 mg/kg | Freq: Once | INTRAVENOUS | Status: AC
  Administered 2022-04-16: 600 mg via INTRAVENOUS
  Filled 2022-04-16: qty 10

## 2022-04-16 MED ORDER — SODIUM CHLORIDE 0.9 % IV SOLN: Freq: Once | INTRAVENOUS | Status: AC

## 2022-04-16 MED ORDER — HEPARIN SOD (PORK) LOCK FLUSH 100 UNIT/ML IV SOLN
500.0000 [IU] | Freq: Once | INTRAVENOUS | Status: AC | PRN
  Administered 2022-04-16: 500 [IU]

## 2022-04-16 MED ORDER — SODIUM CHLORIDE 0.9% FLUSH
10.0000 mL | INTRAVENOUS | Status: DC | PRN
  Administered 2022-04-16: 10 mL

## 2022-04-16 MED ORDER — MAGNESIUM OXIDE -MG SUPPLEMENT 400 (240 MG) MG PO TABS: 400.0000 mg | ORAL_TABLET | Freq: Two times a day (BID) | ORAL | 2 refills | Status: DC

## 2022-04-16 MED ORDER — MAGNESIUM SULFATE 4 GM/100ML IV SOLN
4.0000 g | Freq: Once | INTRAVENOUS | Status: AC
  Administered 2022-04-16: 4 g via INTRAVENOUS
  Filled 2022-04-16: qty 100

## 2022-04-16 NOTE — Progress Notes (Signed)
Patient seen by Ned Card NP today  Vitals are within treatment parameters.  Labs reviewed by Ned Card NP and are not all within treatment parameters. Mg+ 1.3--proceed to tx with IV Mg+ today  Per physician team, patient is ready for treatment. Please note that modifications are being made to the treatment plan including Addition of IV Mg+

## 2022-04-16 NOTE — Progress Notes (Signed)
[facility] OFFICE PROGRESS NOTE   Diagnosis: Colon cancer  INTERVAL HISTORY:   Louis [name] returns as scheduled.  He continues encorafenib and every 2-week Panitumumab.  He denies nausea/vomiting.  He has 1 or 2 mouth sores at the left buccal mucosa.  In 1 area he is sure he bit his cheek.  He is not sure about the other.  No diarrhea.  Skin rash is stable.  Appetite described as "fair".  Urine symptoms have resolved.  He would like to repeat a urinalysis.  Objective:  Vital signs in last 24 hours:  Blood pressure (!) 117/58, pulse 65, temperature 98.1 F (36.7 C), temperature source Oral, resp. rate 18, height _0  (1.93 m), weight 225 lb 9.6 oz (102.3 kg), SpO2 100 %.    HEENT: Small ulceration with surrounding erythema left buccal mucosa. Lymphatics: Approximate 1 cm high left axillary lymph node.  Approximate 3 cm left inguinal lymph node. Resp: Lungs clear bilaterally. Cardio: Regular rate and rhythm. GI: Abdomen soft and nontender.  No hepatosplenomegaly. Vascular: No leg edema. Skin: Acne type rash scattered over the face and anterior chest. Port-A-Cath without erythema.  Lab Results:  Lab Results  Component Value Date   WBC 6.8 04/16/2022   HGB 10.8 (L) 04/16/2022   HCT 33.7 (L) 04/16/2022   MCV 87.5 04/16/2022   PLT 191 04/16/2022   NEUTROABS 4.9 04/16/2022    Imaging:  No results found.  Medications: I have reviewed the patient's current medications.  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), isolated mesenteric implant-resected, multiple tumor deposits Sigmoid/descending resection and creation of a descending colostomy 04/10/2016 MSI-stable, no loss of mismatch repair protein expression Foundation 1- BRAF V600E positive, MS-stable, intermediate tumor mutation burden, no RAS mutation CT abdomen/pelvis 04/02/2016-no evidence of distant metastatic disease Cycle 1 FOLFOX 05/21/2016 Cycle 2 FOLFOX 06/04/2016 Cycle 3  FOLFOX 06/18/2016 Cycle 4 FOLFOX 07/02/2016 (oxaliplatin held secondary to thrombocytopenia) Cycle 5 FOLFOX 07/16/2016 Cycle 6 FOLFOX 07/30/2016 Cycle 7 FOLFOX 08/13/2016 (oxaliplatin held and 5-FU dose reduced) Cycle 8 FOLFOX 09/03/2016 (oxaliplatin held secondary to neuropathy) Cycle 9 FOLFOX 09/17/2016 (oxaliplatin held secondary to neuropathy) Cycle 10 FOLFOX 10/02/2016 Cycle 11 FOLFOX 10/15/2016 (oxaliplatin eliminated from the regimen) Cycle 12 FOLFOX 10/30/2016 (oxaliplatin eliminated) CTs 05/12/2017-no evidence of recurrent disease, right inguinal hernia containing bladder Colonoscopy 07/21/2017, 3 polyps were removed from the descending and transverse colon, fragments of tubular and tubulovillous adenoma CTs 05/19/2018-no evidence of recurrent disease, status post hernia repair, right upper lobe pneumonia CTs 05/20/2019-resolution of right upper lobe pneumonia, no evidence of metastatic disease Colonoscopy 01/25/2020-multiple polyps removed-tubular adenomas, poor preparation, repeat colonoscopy recommended CTs neck, chest, and abdomen/pelvis 08/27/2020- new 2 centimeter left axillary node, new 1.9 cm left inguinal node chronic mildly prominent portacaval node Ultrasound biopsy of left inguinal node on 10/29/2020-metastatic adenocarcinoma consistent with colon adenocarcinoma PET 6/80/3212-YQMGNOIB hypermetabolic left inguinal and left axillary nodes, solitary focus of hypermetabolic activity in the right lower quadrant small bowel Cycle 1 FOLFOX 12/19/2020 Cycle 2 FOLFOX 01/02/2021, oxaliplatin dose reduced secondary to neutropenia and thrombocytopenia Cycle 3 FOLFOX 01/16/2021 Cycle 4 FOLFOX 01/30/2021 CTs 02/11/2021-no change in left inguinal and left axillary nodes, no evidence of disease progression, stable wall thickening involving a loop of distal ileum Cycle 1 FOLFIRI/Avastin 02/27/2021 Cycle 2 FOLFIRI/Avastin 03/13/2021 Cycle 3 FOLFIRI/Avastin 04/03/2021 Cycle 4 FOLFIRI 04/17/2021,  Avastin held due to elevated urine protein 05/07/2021-24-hour urine protein elevated 1562 Cycle 5 FOLFIRI 05/08/2021, Avastin held due to elevated urine protein, irinotecan dose reduced due  to in general not feeling well after treatment CTs 05/17/2021-left axillary and left inguinal lymph nodes are smaller, no evidence of disease progression Cycle 6 FOLFIRI 05/22/2021, Avastin held due to proteinuria 06/03/2021 24-hour urine with 1.9 g of protein Cycle 7 FOLFIRI 06/12/2021, Avastin held due to proteinuria Cycle 8 FOLFIRI 07/03/2021, Avastin held due to proteinuria Cycle 9 FOLFIRI 07/24/2021, Avastin held due to proteinuria Cycle 10 FOLFIRI 08/19/2021, Avastin held due to proteinuria CTs 09/02/2021-no change in the left axillary, left inguinal, and portacaval lymph nodes.  No mention of the distal small bowel thickening Maintenance 5-fluorouracil 09/18/2021 CT abdomen/pelvis 10/28/2021-enlarged left inguinal lymph node, stable; stomach and small bowel grossly unremarkable. Maintenance 5-fluorouracil continued Colonoscopy 12/10/2021-terminal ileum with an ulcerated partially obstructing medium-sized mass approximately 15 to 20 cm from the IC valve, invasive adenocarcinoma, colonic type, moderately differentiated (grade 2) PET scan 12/18/2021-progression of disease in the peritoneal space.  2 intensely hypermetabolic nodes in the left axilla, one node is new from the prior, the other is reduced in size and metabolic activity; decrease in size and metabolic activity of left inguinal adenopathy; intense metabolic activity associate with a loop of small bowel in the right lower quadrant, no interval change; new small hypermetabolic peritoneal implant along the ventral peritoneal surface just right of midline; larger new peritoneal implant in the deep right pelvis; small new implant in the right mid abdomen Encorafenib/Panitumumab 01/08/2022 CTs 03/17/2022-decrease size of mesenteric implant at the posterior bladder wall, small  mesenteric peritoneal lesions identified on PET/CT or not evident.  Decrease size of left inguinal left axillary nodes, hypermetabolic activity noted at the anterior left prostate on comparison August 2023 PET   2.   Chronic renal insufficiency   3.   Hypertension   4.  Inflamed sebaceous cyst at the upper back-status post incision and drainage   5.  Thrombocytopenia secondary chemotherapy-oxaliplatin dose reduced beginning with cycle 3 FOLFOX, oxaliplatin held with cycle 4 and cycle 7 FOLFOX   6.  Oxaliplatin neuropathy-improved   7.  History of Mucositis secondary chemotherapy   8.  Symptoms of pneumonia January 2020-CT 05/19/2018 consistent with right upper lobe pneumonia, Levaquin prescribed 05/20/2018   9.  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  Anemia, progressive 10/29/2021 2 units packed red blood cells 10/31/2021 Ferritin low 10/29/2021-oral iron Ferritin low 01/22/2022   12.  Colonoscopy 12/10/2021-terminal ileum with an ulcerated partially obstructing medium-sized mass approximately 15 to 20 cm from the IC valve, invasive adenocarcinoma, colonic type, moderately differentiated (grade 2)      Disposition: Mr. Closser appears stable.  There is no clinical evidence of disease progression.  He will continue encorafenib and Panitumumab.  CBC and chemistry panel reviewed.  Labs adequate to proceed as above.  Persistent low magnesium.  He will begin magnesium oxide 400 mg twice daily.  He will receive IV magnesium today.  He will return for lab, follow-up, Panitumumab in 2 weeks.  We are available to see him sooner if needed.    Ned Card ANP/GNP-BC   04/16/2022  10:16 AM

## 2022-04-16 NOTE — Patient Instructions (Signed)

## 2022-04-16 NOTE — Patient Instructions (Signed)
Louis [name]   Discharge Instructions: Thank you for choosing [facility] to provide your oncology and hematology care.   If you have a lab appointment with the [facility], please go directly to the [facility] and check in at the registration area.   Wear comfortable clothing and clothing appropriate for easy access to any Portacath or PICC line.   We strive to give you quality time with your provider. You may need to reschedule your appointment if you arrive late (15 or more minutes).  Arriving late affects you and other patients whose appointments are after yours.  Also, if you miss three or more appointments without notifying the office, you may be dismissed from the clinic at the provider's discretion.      For prescription refill requests, have your pharmacy contact our office and allow 72 hours for refills to be completed.    Today you received the following chemotherapy and/or immunotherapy agents Vectibix      To help prevent nausea and vomiting after your treatment, we encourage you to take your nausea medication as directed.  BELOW ARE SYMPTOMS THAT SHOULD BE REPORTED IMMEDIATELY: *FEVER GREATER THAN 100.4 F (38 C) OR HIGHER *CHILLS OR SWEATING *NAUSEA AND VOMITING THAT IS NOT CONTROLLED WITH YOUR NAUSEA MEDICATION *UNUSUAL SHORTNESS OF BREATH *UNUSUAL BRUISING OR BLEEDING *URINARY PROBLEMS (pain or burning when urinating, or frequent urination) *BOWEL PROBLEMS (unusual diarrhea, constipation, pain near the anus) TENDERNESS IN MOUTH AND THROAT WITH OR WITHOUT PRESENCE OF ULCERS (sore throat, sores in mouth, or a toothache) UNUSUAL RASH, SWELLING OR PAIN  UNUSUAL VAGINAL DISCHARGE OR ITCHING   Items with * indicate a potential emergency and should be followed up as soon as possible or go to the Emergency Department if any problems should occur.  Please show the CHEMOTHERAPY ALERT CARD or IMMUNOTHERAPY ALERT CARD at check-in to the  Emergency Department and triage nurse.  Should you have questions after your visit or need to cancel or reschedule your appointment, please contact [facility]  Dept: [phone]  and follow the prompts.  Office hours are 8:00 a.m. to 4:30 p.m. Monday - Friday. Please note that voicemails left after 4:00 p.m. may not be returned until the following business day.  We are closed weekends and major holidays. You have access to a nurse at all times for urgent questions. Please call the main number to the clinic Dept: [phone] and follow the prompts.   For any non-urgent questions, you may also contact your provider using MyChart. We now offer e-Visits for anyone 31 and older to request care online for non-urgent symptoms. For details visit [email or web address].   Also download the MyChart app! Go to the app store, search "MyChart", open the app, select [facility], and log in with your MyChart username and password.  Masks are optional in the cancer centers. If you would like for your care team to wear a mask while they are taking care of you, please let them know. You may have one support person who is at least 75 years old accompany you for your appointments.  Panitumumab Injection What is this medication? PANITUMUMAB (pan i TOOM ue mab) treats colorectal cancer. It works by blocking a protein that causes cancer cells to grow and multiply. This helps to slow or stop the spread of cancer cells. It is a monoclonal antibody. This medicine may be used for other purposes; ask your health care provider or pharmacist  if you have questions. COMMON BRAND NAME(S): Vectibix What should I tell my care team before I take this medication? They need to know if you have any of these conditions: Eye disease Low levels of magnesium in the blood Lung disease An unusual or allergic reaction to panitumumab, other medications, foods, dyes, or preservatives Pregnant or trying to get  pregnant Breast-feeding How should I use this medication? This medication is injected into a vein. It is given by your care team in a hospital or clinic setting. Talk to your care team about the use of this medication in children. Special care may be needed. Overdosage: If you think you have taken too much of this medicine contact a poison control center or emergency room at once. NOTE: This medicine is only for you. Do not share this medicine with others. What if I miss a dose? Keep appointments for follow-up doses. It is important not to miss your dose. Call your care team if you are unable to keep an appointment. What may interact with this medication? Bevacizumab This list may not describe all possible interactions. Give your health care provider a list of all the medicines, herbs, non-prescription drugs, or dietary supplements you use. Also tell them if you smoke, drink alcohol, or use illegal drugs. Some items may interact with your medicine. What should I watch for while using this medication? Your condition will be monitored carefully while you are receiving this medication. This medication may make you feel generally unwell. This is not uncommon as chemotherapy can affect healthy cells as well as cancer cells. Report any side effects. Continue your course of treatment even though you feel ill unless your care team tells you to stop. This medication can make you more sensitive to the sun. Keep out of the sun while receiving this medication and for 2 months after stopping therapy. If you cannot avoid being in the sun, wear protective clothing and sunscreen. Do not use sun lamps, tanning beds, or tanning booths. Check with your care team if you have severe diarrhea, nausea, and vomiting or if you sweat a lot. The loss of too much body fluid may make it dangerous for you to take this medication. This medication may cause serious skin reactions. They can happen weeks to months after starting the  medication. Contact your care team right away if you notice fevers or flu-like symptoms with a rash. The rash may be red or purple and then turn into blisters or peeling of the skin. You may also notice a red rash with swelling of the face, lips, or lymph nodes in your neck or under your arms. Talk to your care team if you may be pregnant. Serious birth defects can occur if you take this medication during pregnancy and for 2 months after the last dose. Contraception is recommended while taking this medication and for 2 months after the last dose. Your care team can help you find the option that works for you. Do not breastfeed while taking this medication and for 2 months after the last dose. This medication may cause infertility. Talk to your care team if you are concerned about your fertility. What side effects may I notice from receiving this medication? Side effects that you should report to your care team as soon as possible: Allergic reactions--skin rash, itching, hives, swelling of the face, lips, tongue, or throat Dry cough, shortness of breath or trouble breathing Eye pain, redness, irritation, or discharge with blurry or decreased vision Infusion reactions--chest  pain, shortness of breath or trouble breathing, feeling faint or lightheaded Low magnesium level--muscle pain or cramps, unusual weakness or fatigue, fast or irregular heartbeat, tremors Low potassium level--muscle pain or cramps, unusual weakness or fatigue, fast or irregular heartbeat, constipation Redness, blistering, peeling, or loosening of the skin, including inside the mouth Skin reactions on sun-exposed areas Side effects that usually do not require medical attention (report to your care team if they continue or are bothersome): Change in nail shape, thickness, or color Diarrhea Dry skin Fatigue Nausea Vomiting This list may not describe all possible side effects. Call your doctor for medical advice about side effects.  You may report side effects to FDA at 1-800-FDA-1088. Where should I keep my medication? This medication is given in a hospital or clinic. It will not be stored at home. NOTE: This sheet is a summary. It may not cover all possible information. If you have questions about this medicine, talk to your doctor, pharmacist, or health care provider.  2023 Elsevier/Gold Standard (2021-09-04 00:00:00)  Hypomagnesemia Hypomagnesemia is a condition in which the level of magnesium in the blood is too low. Magnesium is a mineral that is found in many foods. It is used in many different processes in the body. Hypomagnesemia can affect every organ in the body. In severe cases, it can cause life-threatening problems. What are the causes? This condition may be caused by: Not getting enough magnesium in your diet or not having enough healthy foods to eat (malnutrition). Problems with magnesium absorption in the intestines. Dehydration. Excessive use of alcohol. Vomiting. Severe or long-term (chronic) diarrhea. Some medicines, including medicines that make you urinate more often (diuretics). Certain diseases, such as kidney disease, diabetes, celiac disease, and overactive thyroid. What are the signs or symptoms? Symptoms of this condition include: Loss of appetite, nausea, and vomiting. Involuntary shaking or trembling of a body part (tremor). Muscle weakness or tingling in the arms and legs. Sudden tightening of muscles (muscle spasms). Confusion. Psychiatric issues, such as: Depression and irritability. Psychosis. A feeling of fluttering of the heart (palpitations). Seizures. These symptoms are more severe if magnesium levels drop suddenly. How is this diagnosed? This condition may be diagnosed based on: Your symptoms and medical history. A physical exam. Blood and urine tests. How is this treated? Treatment depends on the cause and the severity of the condition. It may be treated by: Taking a  magnesium supplement. This can be taken in pill form. If the condition is severe, magnesium is usually given through an IV. Making changes to your diet. You may be directed to eat foods that have a lot of magnesium, such as green leafy vegetables, peas, beans, and nuts. Not drinking alcohol. If you are struggling not to drink, ask your health care provider for help. Follow these instructions at home: Eating and drinking     Make sure that your diet includes foods with magnesium. Foods that have a lot of magnesium in them include: Green leafy vegetables, such as spinach and broccoli. Beans and peas. Nuts and seeds, such as almonds and sunflower seeds. Whole grains, such as whole grain bread and fortified cereals. Drink fluids that contain salts and minerals (electrolytes), such as sports drinks, when you are active. Do not drink alcohol. General instructions Take over-the-counter and prescription medicines only as told by your health care provider. Take magnesium supplements as directed if your health care provider tells you to take them. Have your magnesium levels monitored as told by your health care provider.  Keep all follow-up visits. This is important. Contact a health care provider if: You get worse instead of better. Your symptoms return. Get help right away if: You develop severe muscle weakness. You have trouble breathing. You feel that your heart is racing. These symptoms may represent a serious problem that is an emergency. Do not wait to see if the symptoms will go away. Get medical help right away. Call your local emergency services (911 in the [place] Do not drive yourself to the hospital. Summary Hypomagnesemia is a condition in which the level of magnesium in the blood is too low. Hypomagnesemia can affect every organ in the body. Treatment may include eating more foods that contain magnesium, taking magnesium supplements, and not drinking alcohol. Have your magnesium  levels monitored as told by your health care provider. This information is not intended to replace advice given to you by your health care provider. Make sure you discuss any questions you have with your health care provider. Document Revised: 09/18/2020 Document Reviewed: 09/18/2020 Elsevier Patient Education  [place].

## 2022-04-17 ENCOUNTER — Other Ambulatory Visit: Payer: Self-pay

## 2022-04-17 ENCOUNTER — Encounter: Payer: Self-pay | Admitting: Oncology

## 2022-04-17 LAB — URINE CULTURE: Culture: NO GROWTH

## 2022-04-18 ENCOUNTER — Encounter: Payer: Self-pay | Admitting: Oncology

## 2022-04-18 LAB — PROSTATE-SPECIFIC AG, SERUM (LABCORP): Prostate Specific Ag, Serum: 3.7 ng/mL (ref 0.0–4.0)

## 2022-04-20 ENCOUNTER — Other Ambulatory Visit: Payer: Self-pay

## 2022-04-30 ENCOUNTER — Inpatient Hospital Stay: Payer: Medicare Other

## 2022-04-30 ENCOUNTER — Inpatient Hospital Stay (HOSPITAL_BASED_OUTPATIENT_CLINIC_OR_DEPARTMENT_OTHER): Payer: Medicare Other | Admitting: Oncology

## 2022-04-30 VITALS — BP 117/62 | HR 80 | Temp 98.2°F | Resp 18 | Ht 76.0 in | Wt 223.9 lb

## 2022-04-30 DIAGNOSIS — C774 Secondary and unspecified malignant neoplasm of inguinal and lower limb lymph nodes: Secondary | ICD-10-CM | POA: Diagnosis not present

## 2022-04-30 DIAGNOSIS — N189 Chronic kidney disease, unspecified: Secondary | ICD-10-CM | POA: Diagnosis not present

## 2022-04-30 DIAGNOSIS — G629 Polyneuropathy, unspecified: Secondary | ICD-10-CM | POA: Diagnosis not present

## 2022-04-30 DIAGNOSIS — C189 Malignant neoplasm of colon, unspecified: Secondary | ICD-10-CM

## 2022-04-30 DIAGNOSIS — C187 Malignant neoplasm of sigmoid colon: Secondary | ICD-10-CM | POA: Diagnosis not present

## 2022-04-30 DIAGNOSIS — Z5112 Encounter for antineoplastic immunotherapy: Secondary | ICD-10-CM | POA: Diagnosis not present

## 2022-04-30 DIAGNOSIS — D649 Anemia, unspecified: Secondary | ICD-10-CM | POA: Diagnosis not present

## 2022-04-30 LAB — CBC WITH DIFFERENTIAL (CANCER CENTER ONLY)
Abs Immature Granulocytes: 0.06 10*3/uL (ref 0.00–0.07)
Basophils Absolute: 0.1 10*3/uL (ref 0.0–0.1)
Basophils Relative: 1 %
Eosinophils Absolute: 0.3 10*3/uL (ref 0.0–0.5)
Eosinophils Relative: 3 %
HCT: 33.6 % — ABNORMAL LOW (ref 39.0–52.0)
Hemoglobin: 10.8 g/dL — ABNORMAL LOW (ref 13.0–17.0)
Immature Granulocytes: 1 %
Lymphocytes Relative: 11 %
Lymphs Abs: 0.9 10*3/uL (ref 0.7–4.0)
MCH: 28.1 pg (ref 26.0–34.0)
MCHC: 32.1 g/dL (ref 30.0–36.0)
MCV: 87.3 fL (ref 80.0–100.0)
Monocytes Absolute: 0.6 10*3/uL (ref 0.1–1.0)
Monocytes Relative: 8 %
Neutro Abs: 6.2 10*3/uL (ref 1.7–7.7)
Neutrophils Relative %: 76 %
Platelet Count: 209 10*3/uL (ref 150–400)
RBC: 3.85 MIL/uL — ABNORMAL LOW (ref 4.22–5.81)
RDW: 15.6 % — ABNORMAL HIGH (ref 11.5–15.5)
WBC Count: 8.1 10*3/uL (ref 4.0–10.5)
nRBC: 0 % (ref 0.0–0.2)

## 2022-04-30 LAB — CMP (CANCER CENTER ONLY)
ALT: 6 U/L (ref 0–44)
AST: 7 U/L — ABNORMAL LOW (ref 15–41)
Albumin: 3.7 g/dL (ref 3.5–5.0)
Alkaline Phosphatase: 56 U/L (ref 38–126)
Anion gap: 10 (ref 5–15)
BUN: 36 mg/dL — ABNORMAL HIGH (ref 8–23)
CO2: 19 mmol/L — ABNORMAL LOW (ref 22–32)
Calcium: 8.9 mg/dL (ref 8.9–10.3)
Chloride: 110 mmol/L (ref 98–111)
Creatinine: 2.35 mg/dL — ABNORMAL HIGH (ref 0.61–1.24)
GFR, Estimated: 28 mL/min — ABNORMAL LOW (ref >60)
Glucose, Bld: 159 mg/dL — ABNORMAL HIGH (ref 70–99)
Potassium: 4.5 mmol/L (ref 3.5–5.1)
Sodium: 139 mmol/L (ref 135–145)
Total Bilirubin: 0.3 mg/dL (ref 0.3–1.2)
Total Protein: 6.9 g/dL (ref 6.5–8.1)

## 2022-04-30 LAB — MAGNESIUM: Magnesium: 1.3 mg/dL — ABNORMAL LOW (ref 1.7–2.4)

## 2022-04-30 MED ORDER — SODIUM CHLORIDE 0.9 % IV SOLN: Freq: Once | INTRAVENOUS | Status: AC

## 2022-04-30 MED ORDER — MAGNESIUM SULFATE 2 GM/50ML IV SOLN
2.0000 g | Freq: Once | INTRAVENOUS | Status: AC
  Administered 2022-04-30: 2 g via INTRAVENOUS
  Filled 2022-04-30: qty 50

## 2022-04-30 MED ORDER — SODIUM CHLORIDE 0.9% FLUSH
10.0000 mL | INTRAVENOUS | Status: DC | PRN
  Administered 2022-04-30: 10 mL

## 2022-04-30 MED ORDER — SODIUM CHLORIDE 0.9 % IV SOLN
6.0000 mg/kg | Freq: Once | INTRAVENOUS | Status: AC
  Administered 2022-04-30: 600 mg via INTRAVENOUS
  Filled 2022-04-30: qty 20

## 2022-04-30 MED ORDER — HEPARIN SOD (PORK) LOCK FLUSH 100 UNIT/ML IV SOLN
500.0000 [IU] | Freq: Once | INTRAVENOUS | Status: AC | PRN
  Administered 2022-04-30: 500 [IU]

## 2022-04-30 NOTE — Patient Instructions (Signed)
Louis [name]   Discharge Instructions: Thank you for choosing [facility] to provide your oncology and hematology care.   If you have a lab appointment with the [facility], please go directly to the [facility] and check in at the registration area.   Wear comfortable clothing and clothing appropriate for easy access to any Portacath or PICC line.   We strive to give you quality time with your provider. You may need to reschedule your appointment if you arrive late (15 or more minutes).  Arriving late affects you and other patients whose appointments are after yours.  Also, if you miss three or more appointments without notifying the office, you may be dismissed from the clinic at the provider's discretion.      For prescription refill requests, have your pharmacy contact our office and allow 72 hours for refills to be completed.    Today you received the following chemotherapy and/or immunotherapy agents Vectibix      To help prevent nausea and vomiting after your treatment, we encourage you to take your nausea medication as directed.  BELOW ARE SYMPTOMS THAT SHOULD BE REPORTED IMMEDIATELY: *FEVER GREATER THAN 100.4 F (38 C) OR HIGHER *CHILLS OR SWEATING *NAUSEA AND VOMITING THAT IS NOT CONTROLLED WITH YOUR NAUSEA MEDICATION *UNUSUAL SHORTNESS OF BREATH *UNUSUAL BRUISING OR BLEEDING *URINARY PROBLEMS (pain or burning when urinating, or frequent urination) *BOWEL PROBLEMS (unusual diarrhea, constipation, pain near the anus) TENDERNESS IN MOUTH AND THROAT WITH OR WITHOUT PRESENCE OF ULCERS (sore throat, sores in mouth, or a toothache) UNUSUAL RASH, SWELLING OR PAIN  UNUSUAL VAGINAL DISCHARGE OR ITCHING   Items with * indicate a potential emergency and should be followed up as soon as possible or go to the Emergency Department if any problems should occur.  Please show the CHEMOTHERAPY ALERT CARD or IMMUNOTHERAPY ALERT CARD at check-in to the  Emergency Department and triage nurse.  Should you have questions after your visit or need to cancel or reschedule your appointment, please contact [facility]  Dept: [phone]  and follow the prompts.  Office hours are 8:00 a.m. to 4:30 p.m. Monday - Friday. Please note that voicemails left after 4:00 p.m. may not be returned until the following business day.  We are closed weekends and major holidays. You have access to a nurse at all times for urgent questions. Please call the main number to the clinic Dept: [phone] and follow the prompts.   For any non-urgent questions, you may also contact your provider using MyChart. We now offer e-Visits for anyone 51 and older to request care online for non-urgent symptoms. For details visit [email or web address].   Also download the MyChart app! Go to the app store, search "MyChart", open the app, select [facility], and log in with your MyChart username and password.  Panitumumab Injection What is this medication? PANITUMUMAB (pan i TOOM ue mab) treats colorectal cancer. It works by blocking a protein that causes cancer cells to grow and multiply. This helps to slow or stop the spread of cancer cells. It is a monoclonal antibody. This medicine may be used for other purposes; ask your health care provider or pharmacist if you have questions. COMMON BRAND NAME(S): Vectibix What should I tell my care team before I take this medication? They need to know if you have any of these conditions: Eye disease Low levels of magnesium in the blood Lung disease An unusual or allergic reaction to panitumumab,  other medications, foods, dyes, or preservatives Pregnant or trying to get pregnant Breast-feeding How should I use this medication? This medication is injected into a vein. It is given by your care team in a hospital or clinic setting. Talk to your care team about the use of this medication in children. Special care may  be needed. Overdosage: If you think you have taken too much of this medicine contact a poison control center or emergency room at once. NOTE: This medicine is only for you. Do not share this medicine with others. What if I miss a dose? Keep appointments for follow-up doses. It is important not to miss your dose. Call your care team if you are unable to keep an appointment. What may interact with this medication? Bevacizumab This list may not describe all possible interactions. Give your health care provider a list of all the medicines, herbs, non-prescription drugs, or dietary supplements you use. Also tell them if you smoke, drink alcohol, or use illegal drugs. Some items may interact with your medicine. What should I watch for while using this medication? Your condition will be monitored carefully while you are receiving this medication. This medication may make you feel generally unwell. This is not uncommon as chemotherapy can affect healthy cells as well as cancer cells. Report any side effects. Continue your course of treatment even though you feel ill unless your care team tells you to stop. This medication can make you more sensitive to the sun. Keep out of the sun while receiving this medication and for 2 months after stopping therapy. If you cannot avoid being in the sun, wear protective clothing and sunscreen. Do not use sun lamps, tanning beds, or tanning booths. Check with your care team if you have severe diarrhea, nausea, and vomiting or if you sweat a lot. The loss of too much body fluid may make it dangerous for you to take this medication. This medication may cause serious skin reactions. They can happen weeks to months after starting the medication. Contact your care team right away if you notice fevers or flu-like symptoms with a rash. The rash may be red or purple and then turn into blisters or peeling of the skin. You may also notice a red rash with swelling of the face, lips, or  lymph nodes in your neck or under your arms. Talk to your care team if you may be pregnant. Serious birth defects can occur if you take this medication during pregnancy and for 2 months after the last dose. Contraception is recommended while taking this medication and for 2 months after the last dose. Your care team can help you find the option that works for you. Do not breastfeed while taking this medication and for 2 months after the last dose. This medication may cause infertility. Talk to your care team if you are concerned about your fertility. What side effects may I notice from receiving this medication? Side effects that you should report to your care team as soon as possible: Allergic reactions--skin rash, itching, hives, swelling of the face, lips, tongue, or throat Dry cough, shortness of breath or trouble breathing Eye pain, redness, irritation, or discharge with blurry or decreased vision Infusion reactions--chest pain, shortness of breath or trouble breathing, feeling faint or lightheaded Low magnesium level--muscle pain or cramps, unusual weakness or fatigue, fast or irregular heartbeat, tremors Low potassium level--muscle pain or cramps, unusual weakness or fatigue, fast or irregular heartbeat, constipation Redness, blistering, peeling, or loosening of the skin,  including inside the mouth Skin reactions on sun-exposed areas Side effects that usually do not require medical attention (report to your care team if they continue or are bothersome): Change in nail shape, thickness, or color Diarrhea Dry skin Fatigue Nausea Vomiting This list may not describe all possible side effects. Call your doctor for medical advice about side effects. You may report side effects to FDA at 1-800-FDA-1088. Where should I keep my medication? This medication is given in a hospital or clinic. It will not be stored at home. NOTE: This sheet is a summary. It may not cover all possible information. If  you have questions about this medicine, talk to your doctor, pharmacist, or health care provider.  2023 Elsevier/Gold Standard (2021-09-04 00:00:00)  Hypomagnesemia Hypomagnesemia is a condition in which the level of magnesium in the blood is too low. Magnesium is a mineral that is found in many foods. It is used in many different processes in the body. Hypomagnesemia can affect every organ in the body. In severe cases, it can cause life-threatening problems. What are the causes? This condition may be caused by: Not getting enough magnesium in your diet or not having enough healthy foods to eat (malnutrition). Problems with magnesium absorption in the intestines. Dehydration. Excessive use of alcohol. Vomiting. Severe or long-term (chronic) diarrhea. Some medicines, including medicines that make you urinate more often (diuretics). Certain diseases, such as kidney disease, diabetes, celiac disease, and overactive thyroid. What are the signs or symptoms? Symptoms of this condition include: Loss of appetite, nausea, and vomiting. Involuntary shaking or trembling of a body part (tremor). Muscle weakness or tingling in the arms and legs. Sudden tightening of muscles (muscle spasms). Confusion. Psychiatric issues, such as: Depression and irritability. Psychosis. A feeling of fluttering of the heart (palpitations). Seizures. These symptoms are more severe if magnesium levels drop suddenly. How is this diagnosed? This condition may be diagnosed based on: Your symptoms and medical history. A physical exam. Blood and urine tests. How is this treated? Treatment depends on the cause and the severity of the condition. It may be treated by: Taking a magnesium supplement. This can be taken in pill form. If the condition is severe, magnesium is usually given through an IV. Making changes to your diet. You may be directed to eat foods that have a lot of magnesium, such as green leafy vegetables,  peas, beans, and nuts. Not drinking alcohol. If you are struggling not to drink, ask your health care provider for help. Follow these instructions at home: Eating and drinking     Make sure that your diet includes foods with magnesium. Foods that have a lot of magnesium in them include: Green leafy vegetables, such as spinach and broccoli. Beans and peas. Nuts and seeds, such as almonds and sunflower seeds. Whole grains, such as whole grain bread and fortified cereals. Drink fluids that contain salts and minerals (electrolytes), such as sports drinks, when you are active. Do not drink alcohol. General instructions Take over-the-counter and prescription medicines only as told by your health care provider. Take magnesium supplements as directed if your health care provider tells you to take them. Have your magnesium levels monitored as told by your health care provider. Keep all follow-up visits. This is important. Contact a health care provider if: You get worse instead of better. Your symptoms return. Get help right away if: You develop severe muscle weakness. You have trouble breathing. You feel that your heart is racing. These symptoms may represent a serious  problem that is an emergency. Do not wait to see if the symptoms will go away. Get medical help right away. Call your local emergency services (911 in the [place] Do not drive yourself to the hospital. Summary Hypomagnesemia is a condition in which the level of magnesium in the blood is too low. Hypomagnesemia can affect every organ in the body. Treatment may include eating more foods that contain magnesium, taking magnesium supplements, and not drinking alcohol. Have your magnesium levels monitored as told by your health care provider. This information is not intended to replace advice given to you by your health care provider. Make sure you discuss any questions you have with your health care provider. Document Revised:  09/18/2020 Document Reviewed: 09/18/2020 Elsevier Patient Education  [place].

## 2022-04-30 NOTE — Progress Notes (Signed)
[facility] OFFICE PROGRESS NOTE   Diagnosis: Colon cancer  INTERVAL HISTORY:   Louis [name] returns as scheduled.  He feels well.  He reports a poor appetite since starting treatment with encorafenib and panitumumab.  No diarrhea.  He has a rash over the face and chest.  He is not using hydrocortisone cream.  He has not started a magnesium supplement.  Objective:  Vital signs in last 24 hours:  Blood pressure 117/62, pulse 80, temperature 98.2 F (36.8 C), temperature source Oral, resp. rate 18, height _0  (1.93 m), weight 223 lb 14.4 oz (101.6 kg), SpO2 100 %.    HEENT: No thrush or ulcers Lymphatics: 1-1.5 cm mobile left axillary node (I cannot appreciate a second left axillary node today).  No right axillary node.  3-4 cm left inguinal node.,  No right inguinal node Resp: Lungs clear bilaterally Cardio: Regular rate and rhythm GI: No hepatosplenomegaly Vascular: No leg edema  Skin: Acne type rash over the face, neck, and anterior chest  Portacath/PICC-without erythema  Lab Results:  Lab Results  Component Value Date   WBC 8.1 04/30/2022   HGB 10.8 (L) 04/30/2022   HCT 33.6 (L) 04/30/2022   MCV 87.3 04/30/2022   PLT 209 04/30/2022   NEUTROABS 6.2 04/30/2022    CMP  Lab Results  Component Value Date   NA 139 04/30/2022   K 4.5 04/30/2022   CL 110 04/30/2022   CO2 19 (L) 04/30/2022   GLUCOSE 159 (H) 04/30/2022   BUN 36 (H) 04/30/2022   CREATININE 2.35 (H) 04/30/2022   CALCIUM 8.9 04/30/2022   PROT 6.9 04/30/2022   ALBUMIN 3.7 04/30/2022   AST 7 (L) 04/30/2022   ALT 6 04/30/2022   ALKPHOS 56 04/30/2022   BILITOT 0.3 04/30/2022   GFRNONAA 28 (L) 04/30/2022   GFRAA 28 (L) 09/20/2019    Lab Results  Component Value Date   CEA1 2.61 01/02/2021   CEA 2.88 12/26/2021    Medications: I have reviewed the patient's current medications.   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), isolated mesenteric implant-resected,  multiple tumor deposits Sigmoid/descending resection and creation of a descending colostomy 04/10/2016 MSI-stable, no loss of mismatch repair protein expression Foundation 1- BRAF V600E positive, MS-stable, intermediate tumor mutation burden, no RAS mutation CT abdomen/pelvis 04/02/2016-no evidence of distant metastatic disease Cycle 1 FOLFOX 05/21/2016 Cycle 2 FOLFOX 06/04/2016 Cycle 3 FOLFOX 06/18/2016 Cycle 4 FOLFOX 07/02/2016 (oxaliplatin held secondary to thrombocytopenia) Cycle 5 FOLFOX 07/16/2016 Cycle 6 FOLFOX 07/30/2016 Cycle 7 FOLFOX 08/13/2016 (oxaliplatin held and 5-FU dose reduced) Cycle 8 FOLFOX 09/03/2016 (oxaliplatin held secondary to neuropathy) Cycle 9 FOLFOX 09/17/2016 (oxaliplatin held secondary to neuropathy) Cycle 10 FOLFOX 10/02/2016 Cycle 11 FOLFOX 10/15/2016 (oxaliplatin eliminated from the regimen) Cycle 12 FOLFOX 10/30/2016 (oxaliplatin eliminated) CTs 05/12/2017-no evidence of recurrent disease, right inguinal hernia containing bladder Colonoscopy 07/21/2017, 3 polyps were removed from the descending and transverse colon, fragments of tubular and tubulovillous adenoma CTs 05/19/2018-no evidence of recurrent disease, status post hernia repair, right upper lobe pneumonia CTs 05/20/2019-resolution of right upper lobe pneumonia, no evidence of metastatic disease Colonoscopy 01/25/2020-multiple polyps removed-tubular adenomas, poor preparation, repeat colonoscopy recommended CTs neck, chest, and abdomen/pelvis 08/27/2020- new 2 centimeter left axillary node, new 1.9 cm left inguinal node chronic mildly prominent portacaval node Ultrasound biopsy of left inguinal node on 10/29/2020-metastatic adenocarcinoma consistent with colon adenocarcinoma PET 8/36/[id number] hypermetabolic left inguinal and left axillary nodes, solitary focus of hypermetabolic activity in the right lower quadrant small  bowel Cycle 1 FOLFOX 12/19/2020 Cycle 2 FOLFOX 01/02/2021, oxaliplatin dose reduced  secondary to neutropenia and thrombocytopenia Cycle 3 FOLFOX 01/16/2021 Cycle 4 FOLFOX 01/30/2021 CTs 02/11/2021-no change in left inguinal and left axillary nodes, no evidence of disease progression, stable wall thickening involving a loop of distal ileum Cycle 1 FOLFIRI/Avastin 02/27/2021 Cycle 2 FOLFIRI/Avastin 03/13/2021 Cycle 3 FOLFIRI/Avastin 04/03/2021 Cycle 4 FOLFIRI 04/17/2021, Avastin held due to elevated urine protein 05/07/2021-24-hour urine protein elevated 1562 Cycle 5 FOLFIRI 05/08/2021, Avastin held due to elevated urine protein, irinotecan dose reduced due to in general not feeling well after treatment CTs 05/17/2021-left axillary and left inguinal lymph nodes are smaller, no evidence of disease progression Cycle 6 FOLFIRI 05/22/2021, Avastin held due to proteinuria 06/03/2021 24-hour urine with 1.9 g of protein Cycle 7 FOLFIRI 06/12/2021, Avastin held due to proteinuria Cycle 8 FOLFIRI 07/03/2021, Avastin held due to proteinuria Cycle 9 FOLFIRI 07/24/2021, Avastin held due to proteinuria Cycle 10 FOLFIRI 08/19/2021, Avastin held due to proteinuria CTs 09/02/2021-no change in the left axillary, left inguinal, and portacaval lymph nodes.  No mention of the distal small bowel thickening Maintenance 5-fluorouracil 09/18/2021 CT abdomen/pelvis 10/28/2021-enlarged left inguinal lymph node, stable; stomach and small bowel grossly unremarkable. Maintenance 5-fluorouracil continued Colonoscopy 12/10/2021-terminal ileum with an ulcerated partially obstructing medium-sized mass approximately 15 to 20 cm from the IC valve, invasive adenocarcinoma, colonic type, moderately differentiated (grade 2) PET scan 12/18/2021-progression of disease in the peritoneal space.  2 intensely hypermetabolic nodes in the left axilla, one node is new from the prior, the other is reduced in size and metabolic activity; decrease in size and metabolic activity of left inguinal adenopathy; intense metabolic activity associate with a  loop of small bowel in the right lower quadrant, no interval change; new small hypermetabolic peritoneal implant along the ventral peritoneal surface just right of midline; larger new peritoneal implant in the deep right pelvis; small new implant in the right mid abdomen Encorafenib/Panitumumab 01/08/2022 CTs 03/17/2022-decrease size of mesenteric implant at the posterior bladder wall, small mesenteric peritoneal lesions identified on PET/CT or not evident.  Decrease size of left inguinal left axillary nodes, hypermetabolic activity noted at the anterior left prostate on comparison August 2023 PET   2.   Chronic renal insufficiency   3.   Hypertension   4.  Inflamed sebaceous cyst at the upper back-status post incision and drainage   5.  Thrombocytopenia secondary chemotherapy-oxaliplatin dose reduced beginning with cycle 3 FOLFOX, oxaliplatin held with cycle 4 and cycle 7 FOLFOX   6.  Oxaliplatin neuropathy-improved   7.  History of Mucositis secondary chemotherapy   8.  Symptoms of pneumonia January 2020-CT 05/19/2018 consistent with right upper lobe pneumonia, Levaquin prescribed 05/20/2018   9.  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  Anemia, progressive 10/29/2021 2 units packed red blood cells 10/31/2021 Ferritin low 10/29/2021-oral iron Ferritin low 01/22/2022   12.  Colonoscopy 12/10/2021-terminal ileum with an ulcerated partially obstructing medium-sized mass approximately 15 to 20 cm from the IC valve, invasive adenocarcinoma, colonic type, moderately differentiated (grade 2)       Disposition: Louis [name] appears stable.  He is tolerating the panitumumab and encorafenib well.  The palpable lymph nodes appear smaller.  He will continue the current treatment.  Encouraged him to begin the oral magnesium supplement.  He will receive IV magnesium supplementation today.  He will return for an office visit and panitumumab in 2 weeks.  We will plan for a restaging CT at a 82-monthinterval.  GBetsy Coder MD  04/30/2022  10:49 AM

## 2022-04-30 NOTE — Progress Notes (Signed)
Patient seen by Dr. Benay Spice today  Vitals are within treatment parameters.  Labs reviewed by Dr. Benay Spice and are not all within treatment parameters. Mg 1.3 2 Gms Magnesium ordered  Per physician team, patient is ready for treatment and there are NO modifications to the treatment plan.

## 2022-05-01 ENCOUNTER — Other Ambulatory Visit: Payer: Self-pay

## 2022-05-05 ENCOUNTER — Other Ambulatory Visit: Payer: Self-pay | Admitting: Nurse Practitioner

## 2022-05-05 DIAGNOSIS — C189 Malignant neoplasm of colon, unspecified: Secondary | ICD-10-CM

## 2022-05-14 ENCOUNTER — Inpatient Hospital Stay: Payer: Medicare Other | Attending: Oncology

## 2022-05-14 ENCOUNTER — Inpatient Hospital Stay: Payer: Medicare Other

## 2022-05-14 ENCOUNTER — Encounter: Payer: Self-pay | Admitting: Oncology

## 2022-05-14 ENCOUNTER — Inpatient Hospital Stay (HOSPITAL_BASED_OUTPATIENT_CLINIC_OR_DEPARTMENT_OTHER): Payer: Medicare Other | Admitting: Oncology

## 2022-05-14 VITALS — BP 133/60 | HR 77 | Temp 98.1°F | Resp 18 | Ht 76.0 in | Wt 226.3 lb

## 2022-05-14 DIAGNOSIS — C189 Malignant neoplasm of colon, unspecified: Secondary | ICD-10-CM

## 2022-05-14 DIAGNOSIS — D6959 Other secondary thrombocytopenia: Secondary | ICD-10-CM | POA: Insufficient documentation

## 2022-05-14 DIAGNOSIS — C774 Secondary and unspecified malignant neoplasm of inguinal and lower limb lymph nodes: Secondary | ICD-10-CM | POA: Diagnosis not present

## 2022-05-14 DIAGNOSIS — Z5112 Encounter for antineoplastic immunotherapy: Secondary | ICD-10-CM | POA: Diagnosis not present

## 2022-05-14 DIAGNOSIS — D649 Anemia, unspecified: Secondary | ICD-10-CM | POA: Diagnosis not present

## 2022-05-14 DIAGNOSIS — N189 Chronic kidney disease, unspecified: Secondary | ICD-10-CM | POA: Diagnosis not present

## 2022-05-14 DIAGNOSIS — G620 Drug-induced polyneuropathy: Secondary | ICD-10-CM | POA: Insufficient documentation

## 2022-05-14 DIAGNOSIS — C187 Malignant neoplasm of sigmoid colon: Secondary | ICD-10-CM | POA: Diagnosis not present

## 2022-05-14 DIAGNOSIS — I129 Hypertensive chronic kidney disease with stage 1 through stage 4 chronic kidney disease, or unspecified chronic kidney disease: Secondary | ICD-10-CM | POA: Diagnosis not present

## 2022-05-14 LAB — CBC WITH DIFFERENTIAL (CANCER CENTER ONLY)
Abs Immature Granulocytes: 0.04 10*3/uL (ref 0.00–0.07)
Basophils Absolute: 0.1 10*3/uL (ref 0.0–0.1)
Basophils Relative: 1 %
Eosinophils Absolute: 0.2 10*3/uL (ref 0.0–0.5)
Eosinophils Relative: 3 %
HCT: 32.5 % — ABNORMAL LOW (ref 39.0–52.0)
Hemoglobin: 10.3 g/dL — ABNORMAL LOW (ref 13.0–17.0)
Immature Granulocytes: 1 %
Lymphocytes Relative: 15 %
Lymphs Abs: 1.1 10*3/uL (ref 0.7–4.0)
MCH: 28.1 pg (ref 26.0–34.0)
MCHC: 31.7 g/dL (ref 30.0–36.0)
MCV: 88.8 fL (ref 80.0–100.0)
Monocytes Absolute: 0.8 10*3/uL (ref 0.1–1.0)
Monocytes Relative: 11 %
Neutro Abs: 5 10*3/uL (ref 1.7–7.7)
Neutrophils Relative %: 69 %
Platelet Count: 185 10*3/uL (ref 150–400)
RBC: 3.66 MIL/uL — ABNORMAL LOW (ref 4.22–5.81)
RDW: 15.5 % (ref 11.5–15.5)
WBC Count: 7.2 10*3/uL (ref 4.0–10.5)
nRBC: 0 % (ref 0.0–0.2)

## 2022-05-14 LAB — CMP (CANCER CENTER ONLY)
ALT: 6 U/L (ref 0–44)
AST: 7 U/L — ABNORMAL LOW (ref 15–41)
Albumin: 3.7 g/dL (ref 3.5–5.0)
Alkaline Phosphatase: 52 U/L (ref 38–126)
Anion gap: 9 (ref 5–15)
BUN: 35 mg/dL — ABNORMAL HIGH (ref 8–23)
CO2: 21 mmol/L — ABNORMAL LOW (ref 22–32)
Calcium: 8.7 mg/dL — ABNORMAL LOW (ref 8.9–10.3)
Chloride: 108 mmol/L (ref 98–111)
Creatinine: 2.4 mg/dL — ABNORMAL HIGH (ref 0.61–1.24)
GFR, Estimated: 27 mL/min — ABNORMAL LOW (ref >60)
Glucose, Bld: 124 mg/dL — ABNORMAL HIGH (ref 70–99)
Potassium: 4.9 mmol/L (ref 3.5–5.1)
Sodium: 138 mmol/L (ref 135–145)
Total Bilirubin: 0.4 mg/dL (ref 0.3–1.2)
Total Protein: 6.9 g/dL (ref 6.5–8.1)

## 2022-05-14 LAB — MAGNESIUM: Magnesium: 1.9 mg/dL (ref 1.7–2.4)

## 2022-05-14 MED ORDER — SODIUM CHLORIDE 0.9 % IV SOLN
6.0000 mg/kg | Freq: Once | INTRAVENOUS | Status: AC
  Administered 2022-05-14: 600 mg via INTRAVENOUS
  Filled 2022-05-14: qty 10

## 2022-05-14 MED ORDER — SODIUM CHLORIDE 0.9% FLUSH
10.0000 mL | INTRAVENOUS | Status: DC | PRN
  Administered 2022-05-14: 10 mL

## 2022-05-14 MED ORDER — HEPARIN SOD (PORK) LOCK FLUSH 100 UNIT/ML IV SOLN
500.0000 [IU] | Freq: Once | INTRAVENOUS | Status: AC | PRN
  Administered 2022-05-14: 500 [IU]

## 2022-05-14 MED ORDER — SODIUM CHLORIDE 0.9 % IV SOLN: Freq: Once | INTRAVENOUS | Status: AC

## 2022-05-14 NOTE — Progress Notes (Signed)
Patient seen by Dr. Benay Spice today  Vitals are within treatment parameters.  Labs reviewed by Dr. Benay Spice and are not all within treatment parameters. Creatinine 2.4 mg/dL . Per MD Benay Spice, ok to proceed with tx today.  Per physician team, patient is ready for treatment and there are NO modifications to the treatment plan.

## 2022-05-14 NOTE — Patient Instructions (Signed)
[facility]  Discharge Instructions: Thank you for choosing [facility] to provide your oncology and hematology care.   If you have a lab appointment with the [facility], please go directly to the [facility] and check in at the registration area.   Wear comfortable clothing and clothing appropriate for easy access to any Portacath or PICC line.   We strive to give you quality time with your provider. You may need to reschedule your appointment if you arrive late (15 or more minutes).  Arriving late affects you and other patients whose appointments are after yours.  Also, if you miss three or more appointments without notifying the office, you may be dismissed from the clinic at the provider's discretion.      For prescription refill requests, have your pharmacy contact our office and allow 72 hours for refills to be completed.    Today you received the following chemotherapy and/or immunotherapy agents Vectibix      To help prevent nausea and vomiting after your treatment, we encourage you to take your nausea medication as directed.  BELOW ARE SYMPTOMS THAT SHOULD BE REPORTED IMMEDIATELY: *FEVER GREATER THAN 100.4 F (38 C) OR HIGHER *CHILLS OR SWEATING *NAUSEA AND VOMITING THAT IS NOT CONTROLLED WITH YOUR NAUSEA MEDICATION *UNUSUAL SHORTNESS OF BREATH *UNUSUAL BRUISING OR BLEEDING *URINARY PROBLEMS (pain or burning when urinating, or frequent urination) *BOWEL PROBLEMS (unusual diarrhea, constipation, pain near the anus) TENDERNESS IN MOUTH AND THROAT WITH OR WITHOUT PRESENCE OF ULCERS (sore throat, sores in mouth, or a toothache) UNUSUAL RASH, SWELLING OR PAIN  UNUSUAL VAGINAL DISCHARGE OR ITCHING   Items with * indicate a potential emergency and should be followed up as soon as possible or go to the Emergency Department if any problems should occur.  Please show the CHEMOTHERAPY ALERT CARD or IMMUNOTHERAPY ALERT CARD at check-in to the  Emergency Department and triage nurse.  Should you have questions after your visit or need to cancel or reschedule your appointment, please contact [facility]  Dept: [phone]  and follow the prompts.  Office hours are 8:00 a.m. to 4:30 p.m. Monday - Friday. Please note that voicemails left after 4:00 p.m. may not be returned until the following business day.  We are closed weekends and major holidays. You have access to a nurse at all times for urgent questions. Please call the main number to the clinic Dept: [phone] and follow the prompts.   For any non-urgent questions, you may also contact your provider using MyChart. We now offer e-Visits for anyone 21 and older to request care online for non-urgent symptoms. For details visit [email or web address].   Also download the MyChart app! Go to the app store, search "MyChart", open the app, select [facility], and log in with your MyChart username and password.

## 2022-05-14 NOTE — Progress Notes (Signed)
[place] OFFICE PROGRESS NOTE   Diagnosis: Colon cancer  INTERVAL HISTORY:   Mr. Geister returns thrush or ulcers as scheduled.  He completed another cycle of panitumumab on 04/30/2022.  He continues encorafenib.  No diarrhea.  Stable rash.  The palpable lymph nodes remain small.  No new complaint.  He has difficulty swallowing the magnesium oxide pills.  He request a capsule.  Objective:  Vital signs in last 24 hours:  Blood pressure 133/60, pulse 77, temperature 98.1 F (36.7 C), temperature source Oral, resp. rate 18, height '6\' 4"'$  (1.93 m), weight 226 lb 4.8 oz (102.6 kg), SpO2 100 %.    HEENT: No thrush or ulcers on second Lymphatics: Two 1 cm or less left axillary nodes, no right axillary or right inguinal nodes.  3-4 cm firm left inguinal node Resp: Lungs clear bilaterally Cardio: Regular rate and rhythm GI: No mass, no hepatosplenomegaly Vascular: No leg edema  Skin: Acne type rash over the face and anterior chest  Portacath/PICC-without erythema  Lab Results:  Lab Results  Component Value Date   WBC 7.2 05/14/2022   HGB 10.3 (L) 05/14/2022   HCT 32.5 (L) 05/14/2022   MCV 88.8 05/14/2022   PLT 185 05/14/2022   NEUTROABS 5.0 05/14/2022    CMP  Lab Results  Component Value Date   NA 138 05/14/2022   K 4.9 05/14/2022   CL 108 05/14/2022   CO2 21 (L) 05/14/2022   GLUCOSE 124 (H) 05/14/2022   BUN 35 (H) 05/14/2022   CREATININE 2.40 (H) 05/14/2022   CALCIUM 8.7 (L) 05/14/2022   PROT 6.9 05/14/2022   ALBUMIN 3.7 05/14/2022   AST 7 (L) 05/14/2022   ALT 6 05/14/2022   ALKPHOS 52 05/14/2022   BILITOT 0.4 05/14/2022   GFRNONAA 27 (L) 05/14/2022   GFRAA 28 (L) 09/20/2019    Lab Results  Component Value Date   CEA1 2.61 01/02/2021   CEA 2.88 12/26/2021     Medications: I have reviewed the patient's current medications.   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), isolated mesenteric implant-resected, multiple  tumor deposits Sigmoid/descending resection and creation of a descending colostomy 04/10/2016 MSI-stable, no loss of mismatch repair protein expression Foundation 1- BRAF V600E positive, MS-stable, intermediate tumor mutation burden, no RAS mutation CT abdomen/pelvis 04/02/2016-no evidence of distant metastatic disease Cycle 1 FOLFOX 05/21/2016 Cycle 2 FOLFOX 06/04/2016 Cycle 3 FOLFOX 06/18/2016 Cycle 4 FOLFOX 07/02/2016 (oxaliplatin held secondary to thrombocytopenia) Cycle 5 FOLFOX 07/16/2016 Cycle 6 FOLFOX 07/30/2016 Cycle 7 FOLFOX 08/13/2016 (oxaliplatin held and 5-FU dose reduced) Cycle 8 FOLFOX 09/03/2016 (oxaliplatin held secondary to neuropathy) Cycle 9 FOLFOX 09/17/2016 (oxaliplatin held secondary to neuropathy) Cycle 10 FOLFOX 10/02/2016 Cycle 11 FOLFOX 10/15/2016 (oxaliplatin eliminated from the regimen) Cycle 12 FOLFOX 10/30/2016 (oxaliplatin eliminated) CTs 05/12/2017-no evidence of recurrent disease, right inguinal hernia containing bladder Colonoscopy 07/21/2017, 3 polyps were removed from the descending and transverse colon, fragments of tubular and tubulovillous adenoma CTs 05/19/2018-no evidence of recurrent disease, status post hernia repair, right upper lobe pneumonia CTs 05/20/2019-resolution of right upper lobe pneumonia, no evidence of metastatic disease Colonoscopy 01/25/2020-multiple polyps removed-tubular adenomas, poor preparation, repeat colonoscopy recommended CTs neck, chest, and abdomen/pelvis 08/27/2020- new 2 centimeter left axillary node, new 1.9 cm left inguinal node chronic mildly prominent portacaval node Ultrasound biopsy of left inguinal node on 10/29/2020-metastatic adenocarcinoma consistent with colon adenocarcinoma PET 6/83/4196-[id number] hypermetabolic left inguinal and left axillary nodes, solitary focus of hypermetabolic activity in the right lower quadrant small bowel Cycle  1 FOLFOX 12/19/2020 Cycle 2 FOLFOX 01/02/2021, oxaliplatin dose reduced secondary  to neutropenia and thrombocytopenia Cycle 3 FOLFOX 01/16/2021 Cycle 4 FOLFOX 01/30/2021 CTs 02/11/2021-no change in left inguinal and left axillary nodes, no evidence of disease progression, stable wall thickening involving a loop of distal ileum Cycle 1 FOLFIRI/Avastin 02/27/2021 Cycle 2 FOLFIRI/Avastin 03/13/2021 Cycle 3 FOLFIRI/Avastin 04/03/2021 Cycle 4 FOLFIRI 04/17/2021, Avastin held due to elevated urine protein 05/07/2021-24-hour urine protein elevated 1562 Cycle 5 FOLFIRI 05/08/2021, Avastin held due to elevated urine protein, irinotecan dose reduced due to in general not feeling well after treatment CTs 05/17/2021-left axillary and left inguinal lymph nodes are smaller, no evidence of disease progression Cycle 6 FOLFIRI 05/22/2021, Avastin held due to proteinuria 06/03/2021 24-hour urine with 1.9 g of protein Cycle 7 FOLFIRI 06/12/2021, Avastin held due to proteinuria Cycle 8 FOLFIRI 07/03/2021, Avastin held due to proteinuria Cycle 9 FOLFIRI 07/24/2021, Avastin held due to proteinuria Cycle 10 FOLFIRI 08/19/2021, Avastin held due to proteinuria CTs 09/02/2021-no change in the left axillary, left inguinal, and portacaval lymph nodes.  No mention of the distal small bowel thickening Maintenance 5-fluorouracil 09/18/2021 CT abdomen/pelvis 10/28/2021-enlarged left inguinal lymph node, stable; stomach and small bowel grossly unremarkable. Maintenance 5-fluorouracil continued Colonoscopy 12/10/2021-terminal ileum with an ulcerated partially obstructing medium-sized mass approximately 15 to 20 cm from the IC valve, invasive adenocarcinoma, colonic type, moderately differentiated (grade 2) PET scan 12/18/2021-progression of disease in the peritoneal space.  2 intensely hypermetabolic nodes in the left axilla, one node is new from the prior, the other is reduced in size and metabolic activity; decrease in size and metabolic activity of left inguinal adenopathy; intense metabolic activity associate with a loop of  small bowel in the right lower quadrant, no interval change; new small hypermetabolic peritoneal implant along the ventral peritoneal surface just right of midline; larger new peritoneal implant in the deep right pelvis; small new implant in the right mid abdomen Encorafenib/Panitumumab 01/08/2022 CTs 03/17/2022-decrease size of mesenteric implant at the posterior bladder wall, small mesenteric peritoneal lesions identified on PET/CT or not evident.  Decrease size of left inguinal left axillary nodes, hypermetabolic activity noted at the anterior left prostate on comparison August 2023 PET   2.   Chronic renal insufficiency   3.   Hypertension   4.  Inflamed sebaceous cyst at the upper back-status post incision and drainage   5.  Thrombocytopenia secondary chemotherapy-oxaliplatin dose reduced beginning with cycle 3 FOLFOX, oxaliplatin held with cycle 4 and cycle 7 FOLFOX   6.  Oxaliplatin neuropathy-improved   7.  History of Mucositis secondary chemotherapy   8.  Symptoms of pneumonia January 2020-CT 05/19/2018 consistent with right upper lobe pneumonia, Levaquin prescribed 05/20/2018   9.  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  Anemia, progressive 10/29/2021 2 units packed red blood cells 10/31/2021 Ferritin low 10/29/2021-oral iron Ferritin low 01/22/2022   12.  Colonoscopy 12/10/2021-terminal ileum with an ulcerated partially obstructing medium-sized mass approximately 15 to 20 cm from the IC valve, invasive adenocarcinoma, colonic type, moderately differentiated (grade 2)        Disposition: Mr Remer is stable.  He is tolerating the encorafenib/panitumumab well aside from the face/chest rash.  He will complete another cycle today.  He will continue a magnesium supplement.  He will return for an office visit and panitumumab in 2 weeks.  We will plan for restaging CTs in late February or early March.  Betsy Coder,  MD  05/14/2022  10:31 AM

## 2022-05-15 ENCOUNTER — Other Ambulatory Visit: Payer: Self-pay

## 2022-05-21 ENCOUNTER — Other Ambulatory Visit: Payer: Self-pay

## 2022-05-26 NOTE — Telephone Encounter (Signed)
Called to check status of application. Informed by representative that application was in processing. I will continue to follow and update until final determination.   Berdine Addison, [facility] Patient [place]  [phone] (phone) [phone] (fax) 05/26/2022 11:38 AM

## 2022-05-28 ENCOUNTER — Inpatient Hospital Stay (HOSPITAL_BASED_OUTPATIENT_CLINIC_OR_DEPARTMENT_OTHER): Payer: Medicare Other | Admitting: Oncology

## 2022-05-28 ENCOUNTER — Encounter: Payer: Self-pay | Admitting: *Deleted

## 2022-05-28 ENCOUNTER — Inpatient Hospital Stay: Payer: Medicare Other

## 2022-05-28 ENCOUNTER — Other Ambulatory Visit: Payer: Self-pay

## 2022-05-28 VITALS — BP 137/59 | HR 81 | Temp 98.1°F | Resp 18 | Ht 76.0 in | Wt 225.6 lb

## 2022-05-28 VITALS — BP 139/63 | HR 60 | Resp 20

## 2022-05-28 DIAGNOSIS — C189 Malignant neoplasm of colon, unspecified: Secondary | ICD-10-CM

## 2022-05-28 DIAGNOSIS — I129 Hypertensive chronic kidney disease with stage 1 through stage 4 chronic kidney disease, or unspecified chronic kidney disease: Secondary | ICD-10-CM | POA: Diagnosis not present

## 2022-05-28 DIAGNOSIS — N189 Chronic kidney disease, unspecified: Secondary | ICD-10-CM | POA: Diagnosis not present

## 2022-05-28 DIAGNOSIS — C774 Secondary and unspecified malignant neoplasm of inguinal and lower limb lymph nodes: Secondary | ICD-10-CM | POA: Diagnosis not present

## 2022-05-28 DIAGNOSIS — C187 Malignant neoplasm of sigmoid colon: Secondary | ICD-10-CM | POA: Diagnosis not present

## 2022-05-28 DIAGNOSIS — Z5112 Encounter for antineoplastic immunotherapy: Secondary | ICD-10-CM | POA: Diagnosis not present

## 2022-05-28 DIAGNOSIS — D6959 Other secondary thrombocytopenia: Secondary | ICD-10-CM | POA: Diagnosis not present

## 2022-05-28 LAB — CBC (CANCER CENTER ONLY)
HCT: 30.6 % — ABNORMAL LOW (ref 39.0–52.0)
HCT: 30.6 % — ABNORMAL LOW (ref 39.0–52.0)
Hemoglobin: 9.7 g/dL — ABNORMAL LOW (ref 13.0–17.0)
Hemoglobin: 9.7 g/dL — ABNORMAL LOW (ref 13.0–17.0)
MCH: 28.1 pg (ref 26.0–34.0)
MCH: 28.3 pg (ref 26.0–34.0)
MCHC: 31.7 g/dL (ref 30.0–36.0)
MCHC: 31.7 g/dL (ref 30.0–36.0)
MCV: 88.7 fL (ref 80.0–100.0)
MCV: 89.2 fL (ref 80.0–100.0)
Platelet Count: 181 10*3/uL (ref 150–400)
Platelet Count: 201 10*3/uL (ref 150–400)
RBC: 3.45 MIL/uL — ABNORMAL LOW (ref 4.22–5.81)
RBC: 3.43 MIL/uL — ABNORMAL LOW (ref 4.22–5.81)
RDW: 15.8 % — ABNORMAL HIGH (ref 11.5–15.5)
RDW: 15.9 % — ABNORMAL HIGH (ref 11.5–15.5)
WBC Count: 6.9 10*3/uL (ref 4.0–10.5)
WBC Count: 6.9 10*3/uL (ref 4.0–10.5)
nRBC: 0 % (ref 0.0–0.2)
nRBC: 0 % (ref 0.0–0.2)

## 2022-05-28 LAB — DIFFERENTIAL
Abs Immature Granulocytes: 0.04 10*3/uL (ref 0.00–0.07)
Basophils Absolute: 0 10*3/uL (ref 0.0–0.1)
Basophils Relative: 1 %
Eosinophils Absolute: 0.3 10*3/uL (ref 0.0–0.5)
Eosinophils Relative: 4 %
Immature Granulocytes: 1 %
Lymphocytes Relative: 14 %
Lymphs Abs: 1 10*3/uL (ref 0.7–4.0)
Monocytes Absolute: 0.7 10*3/uL (ref 0.1–1.0)
Monocytes Relative: 10 %
Neutro Abs: 4.8 10*3/uL (ref 1.7–7.7)
Neutrophils Relative %: 70 %

## 2022-05-28 LAB — CMP (CANCER CENTER ONLY)
ALT: 7 U/L (ref 0–44)
AST: 7 U/L — ABNORMAL LOW (ref 15–41)
Albumin: 3.7 g/dL (ref 3.5–5.0)
Alkaline Phosphatase: 50 U/L (ref 38–126)
Anion gap: 9 (ref 5–15)
BUN: 37 mg/dL — ABNORMAL HIGH (ref 8–23)
CO2: 19 mmol/L — ABNORMAL LOW (ref 22–32)
Calcium: 8.8 mg/dL — ABNORMAL LOW (ref 8.9–10.3)
Chloride: 113 mmol/L — ABNORMAL HIGH (ref 98–111)
Creatinine: 2.41 mg/dL — ABNORMAL HIGH (ref 0.61–1.24)
GFR, Estimated: 27 mL/min — ABNORMAL LOW (ref >60)
Glucose, Bld: 154 mg/dL — ABNORMAL HIGH (ref 70–99)
Potassium: 4.7 mmol/L (ref 3.5–5.1)
Sodium: 141 mmol/L (ref 135–145)
Total Bilirubin: 0.3 mg/dL (ref 0.3–1.2)
Total Protein: 6.9 g/dL (ref 6.5–8.1)

## 2022-05-28 LAB — CEA (ACCESS): CEA (CHCC): 1.43 ng/mL (ref 0.00–5.00)

## 2022-05-28 LAB — MAGNESIUM: Magnesium: 1.5 mg/dL — ABNORMAL LOW (ref 1.7–2.4)

## 2022-05-28 MED ORDER — MAGNESIUM SULFATE 2 GM/50ML IV SOLN
2.0000 g | Freq: Once | INTRAVENOUS | Status: AC
  Administered 2022-05-28: 2 g via INTRAVENOUS
  Filled 2022-05-28: qty 50

## 2022-05-28 MED ORDER — SODIUM CHLORIDE 0.9 % IV SOLN
6.0000 mg/kg | Freq: Once | INTRAVENOUS | Status: AC
  Administered 2022-05-28: 600 mg via INTRAVENOUS
  Filled 2022-05-28: qty 20

## 2022-05-28 MED ORDER — SODIUM CHLORIDE 0.9% FLUSH
10.0000 mL | INTRAVENOUS | Status: DC | PRN
  Administered 2022-05-28: 10 mL

## 2022-05-28 MED ORDER — SODIUM CHLORIDE 0.9 % IV SOLN: Freq: Once | INTRAVENOUS | Status: AC

## 2022-05-28 MED ORDER — HEPARIN SOD (PORK) LOCK FLUSH 100 UNIT/ML IV SOLN
500.0000 [IU] | Freq: Once | INTRAVENOUS | Status: AC | PRN
  Administered 2022-05-28: 500 [IU]

## 2022-05-28 NOTE — Patient Instructions (Addendum)
[facility]   Discharge Instructions: Thank you for choosing [facility] to provide your oncology and hematology care.   If you have a lab appointment with the Cancer Center, please go directly to the Cancer Center and check in at the registration area.   Wear comfortable clothing and clothing appropriate for easy access to any Portacath or PICC line.   We strive to give you quality time with your provider. You may need to reschedule your appointment if you arrive late (15 or more minutes).  Arriving late affects you and other patients whose appointments are after yours.  Also, if you miss three or more appointments without notifying the office, you may be dismissed from the clinic at the provider's discretion.      For prescription refill requests, have your pharmacy contact our office and allow 72 hours for refills to be completed.    Today you received the following chemotherapy and/or immunotherapy agents Panitumumab (VECTIBIX).      To help prevent nausea and vomiting after your treatment, we encourage you to take your nausea medication as directed.  BELOW ARE SYMPTOMS THAT SHOULD BE REPORTED IMMEDIATELY: *FEVER GREATER THAN 100.4 F (38 C) OR HIGHER *CHILLS OR SWEATING *NAUSEA AND VOMITING THAT IS NOT CONTROLLED WITH YOUR NAUSEA MEDICATION *UNUSUAL SHORTNESS OF BREATH *UNUSUAL BRUISING OR BLEEDING *URINARY PROBLEMS (pain or burning when urinating, or frequent urination) *BOWEL PROBLEMS (unusual diarrhea, constipation, pain near the anus) TENDERNESS IN MOUTH AND THROAT WITH OR WITHOUT PRESENCE OF ULCERS (sore throat, sores in mouth, or a toothache) UNUSUAL RASH, SWELLING OR PAIN  UNUSUAL VAGINAL DISCHARGE OR ITCHING   Items with * indicate a potential emergency and should be followed up as soon as possible or go to the Emergency Department if any problems should occur.  Please show the CHEMOTHERAPY ALERT CARD or IMMUNOTHERAPY ALERT  CARD at check-in to the Emergency Department and triage nurse.  Should you have questions after your visit or need to cancel or reschedule your appointment, please contact [facility]  Dept: [phone]  and follow the prompts.  Office hours are 8:00 a.m. to 4:30 p.m. Monday - Friday. Please note that voicemails left after 4:00 p.m. may not be returned until the following business day.  We are closed weekends and major holidays. You have access to a nurse at all times for urgent questions. Please call the main number to the clinic Dept: [phone] and follow the prompts.   For any non-urgent questions, you may also contact your provider using MyChart. We now offer e-Visits for anyone 18 and older to request care online for non-urgent symptoms. For details visit [email or web address].   Also download the MyChart app! Go to the app store, search "MyChart", open the app, select [facility], and log in with your MyChart username and password.  Panitumumab Injection What is this medication? PANITUMUMAB (pan i TOOM ue mab) treats colorectal cancer. It works by blocking a protein that causes cancer cells to grow and multiply. This helps to slow or stop the spread of cancer cells. It is a monoclonal antibody. This medicine may be used for other purposes; ask your health care provider or pharmacist if you have questions. COMMON BRAND NAME(S): Vectibix What should I tell my care team before I take this medication? They need to know if you have any of these conditions: Eye disease Low levels of magnesium in the blood Lung disease An unusual or allergic   reaction to panitumumab, other medications, foods, dyes, or preservatives Pregnant or trying to get pregnant Breast-feeding How should I use this medication? This medication is injected into a vein. It is given by your care team in a hospital or clinic setting. Talk to your care team about the use of this medication  in children. Special care may be needed. Overdosage: If you think you have taken too much of this medicine contact a poison control center or emergency room at once. NOTE: This medicine is only for you. Do not share this medicine with others. What if I miss a dose? Keep appointments for follow-up doses. It is important not to miss your dose. Call your care team if you are unable to keep an appointment. What may interact with this medication? Bevacizumab This list may not describe all possible interactions. Give your health care provider a list of all the medicines, herbs, non-prescription drugs, or dietary supplements you use. Also tell them if you smoke, drink alcohol, or use illegal drugs. Some items may interact with your medicine. What should I watch for while using this medication? Your condition will be monitored carefully while you are receiving this medication. This medication may make you feel generally unwell. This is not uncommon as chemotherapy can affect healthy cells as well as cancer cells. Report any side effects. Continue your course of treatment even though you feel ill unless your care team tells you to stop. This medication can make you more sensitive to the sun. Keep out of the sun while receiving this medication and for 2 months after stopping therapy. If you cannot avoid being in the sun, wear protective clothing and sunscreen. Do not use sun lamps, tanning beds, or tanning booths. Check with your care team if you have severe diarrhea, nausea, and vomiting or if you sweat a lot. The loss of too much body fluid may make it dangerous for you to take this medication. This medication may cause serious skin reactions. They can happen weeks to months after starting the medication. Contact your care team right away if you notice fevers or flu-like symptoms with a rash. The rash may be red or purple and then turn into blisters or peeling of the skin. You may also notice a red rash with  swelling of the face, lips, or lymph nodes in your neck or under your arms. Talk to your care team if you may be pregnant. Serious birth defects can occur if you take this medication during pregnancy and for 2 months after the last dose. Contraception is recommended while taking this medication and for 2 months after the last dose. Your care team can help you find the option that works for you. Do not breastfeed while taking this medication and for 2 months after the last dose. This medication may cause infertility. Talk to your care team if you are concerned about your fertility. What side effects may I notice from receiving this medication? Side effects that you should report to your care team as soon as possible: Allergic reactions--skin rash, itching, hives, swelling of the face, lips, tongue, or throat Dry cough, shortness of breath or trouble breathing Eye pain, redness, irritation, or discharge with blurry or decreased vision Infusion reactions--chest pain, shortness of breath or trouble breathing, feeling faint or lightheaded Low magnesium level--muscle pain or cramps, unusual weakness or fatigue, fast or irregular heartbeat, tremors Low potassium level--muscle pain or cramps, unusual weakness or fatigue, fast or irregular heartbeat, constipation Redness, blistering, peeling, or loosening   of the skin, including inside the mouth Skin reactions on sun-exposed areas Side effects that usually do not require medical attention (report to your care team if they continue or are bothersome): Change in nail shape, thickness, or color Diarrhea Dry skin Fatigue Nausea Vomiting This list may not describe all possible side effects. Call your doctor for medical advice about side effects. You may report side effects to FDA at 1-800-FDA-1088. Where should I keep my medication? This medication is given in a hospital or clinic. It will not be stored at home. NOTE: This sheet is a summary. It may not  cover all possible information. If you have questions about this medicine, talk to your doctor, pharmacist, or health care provider.  2023 Elsevier/Gold Standard (2021-09-04 00:00:00)  Magnesium Sulfate Injection What is this medication? MAGNESIUM SULFATE (mag NEE zee um SUL fate) prevents and treats low levels of magnesium in your body. It may also be used to prevent and treat seizures during pregnancy in people with high blood pressure disorders, such as preeclampsia or eclampsia. Magnesium plays an important role in maintaining the health of your muscles and nervous system. This medicine may be used for other purposes; ask your health care provider or pharmacist if you have questions. What should I tell my care team before I take this medication? They need to know if you have any of these conditions: Heart disease History of irregular heart beat Kidney disease An unusual or allergic reaction to magnesium sulfate, medications, foods, dyes, or preservatives Pregnant or trying to get pregnant Breast-feeding How should I use this medication? This medication is for infusion into a vein. It is given in a hospital or clinic setting. Talk to your care team about the use of this medication in children. While this medication may be prescribed for selected conditions, precautions do apply. Overdosage: If you think you have taken too much of this medicine contact a poison control center or emergency room at once. NOTE: This medicine is only for you. Do not share this medicine with others. What if I miss a dose? This does not apply. What may interact with this medication? Certain medications for anxiety or sleep Certain medications for seizures, such phenobarbital Digoxin Medications that relax muscles for surgery Narcotic medications for pain This list may not describe all possible interactions. Give your health care provider a list of all the medicines, herbs, non-prescription drugs, or dietary  supplements you use. Also tell them if you smoke, drink alcohol, or use illegal drugs. Some items may interact with your medicine. What should I watch for while using this medication? Your condition will be monitored carefully while you are receiving this medication. You may need blood work done while you are receiving this medication. What side effects may I notice from receiving this medication? Side effects that you should report to your care team as soon as possible: Allergic reactions--skin rash, itching, hives, swelling of the face, lips, tongue, or throat High magnesium level--confusion, drowsiness, facial flushing, redness, sweating, muscle weakness, fast or irregular heartbeat, trouble breathing Low blood pressure--dizziness, feeling faint or lightheaded, blurry vision Side effects that usually do not require medical attention (report to your care team if they continue or are bothersome): Headache Nausea This list may not describe all possible side effects. Call your doctor for medical advice about side effects. You may report side effects to FDA at 1-800-FDA-1088. Where should I keep my medication? This medication is given in a hospital or clinic and will not be stored   at home. NOTE: This sheet is a summary. It may not cover all possible information. If you have questions about this medicine, talk to your doctor, pharmacist, or health care provider.  2023 Elsevier/Gold Standard (2012-08-27 00:00:00) 

## 2022-05-28 NOTE — Progress Notes (Signed)
Patient seen by Dr. Benay Spice today  Vitals are within treatment parameters.  Labs reviewed by Dr. Benay Spice and are not all within treatment parameters. Mg+ 1.5 and creatinine  2.41 (his normal)  Per physician team, patient is ready for treatment. Please note that modifications are being made to the treatment plan including Please administer 2 grams IV Mg+ today

## 2022-05-28 NOTE — Progress Notes (Signed)
[place] OFFICE PROGRESS NOTE   Diagnosis: Colon cancer  INTERVAL HISTORY:   Louis [name] returns as scheduled.  He continues encorafenib and panitumumab.  No new complaint.  He has a persistent rash over the face and chest.  The rash is not pruritic.  Objective:  Vital signs in last 24 hours:  Blood pressure (!) 137/59, pulse 81, temperature 98.1 F (36.7 C), temperature source Oral, resp. rate 18, height '6\' 4"'$  (1.93 m), weight 225 lb 9.6 oz (102.3 kg), SpO2 100 %.    Lymphatics: 1 cm high left axillary node, 3-4 cm left inguinal node.  No right axillary or right inguinal nodes Resp: Clear bilaterally Cardio: Regular rate and rhythm GI: No mass, no hepatosplenomegaly, nontender Vascular: No leg edema  Skin: Acne type rash over the face and chest, mild acne rash over the upper back  Portacath/PICC-without erythema  Lab Results:  Lab Results  Component Value Date   WBC 6.9 05/28/2022   HGB 9.7 (L) 05/28/2022   HCT 30.6 (L) 05/28/2022   MCV 88.7 05/28/2022   PLT 181 05/28/2022   NEUTROABS 5.0 05/14/2022    CMP  Lab Results  Component Value Date   NA 141 05/28/2022   K 4.7 05/28/2022   CL 113 (H) 05/28/2022   CO2 19 (L) 05/28/2022   GLUCOSE 154 (H) 05/28/2022   BUN 37 (H) 05/28/2022   CREATININE 2.41 (H) 05/28/2022   CALCIUM 8.8 (L) 05/28/2022   PROT 6.9 05/28/2022   ALBUMIN 3.7 05/28/2022   AST 7 (L) 05/28/2022   ALT 7 05/28/2022   ALKPHOS 50 05/28/2022   BILITOT 0.3 05/28/2022   GFRNONAA 27 (L) 05/28/2022   GFRAA 28 (L) 09/20/2019    Lab Results  Component Value Date   CEA1 2.61 01/02/2021   CEA 1.43 05/28/2022     Medications: I have reviewed the patient's current medications.   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), isolated mesenteric implant-resected, multiple tumor deposits Sigmoid/descending resection and creation of a descending colostomy 04/10/2016 MSI-stable, no loss of mismatch repair protein  expression Foundation 1- BRAF V600E positive, MS-stable, intermediate tumor mutation burden, no RAS mutation CT abdomen/pelvis 04/02/2016-no evidence of distant metastatic disease Cycle 1 FOLFOX 05/21/2016 Cycle 2 FOLFOX 06/04/2016 Cycle 3 FOLFOX 06/18/2016 Cycle 4 FOLFOX 07/02/2016 (oxaliplatin held secondary to thrombocytopenia) Cycle 5 FOLFOX 07/16/2016 Cycle 6 FOLFOX 07/30/2016 Cycle 7 FOLFOX 08/13/2016 (oxaliplatin held and 5-FU dose reduced) Cycle 8 FOLFOX 09/03/2016 (oxaliplatin held secondary to neuropathy) Cycle 9 FOLFOX 09/17/2016 (oxaliplatin held secondary to neuropathy) Cycle 10 FOLFOX 10/02/2016 Cycle 11 FOLFOX 10/15/2016 (oxaliplatin eliminated from the regimen) Cycle 12 FOLFOX 10/30/2016 (oxaliplatin eliminated) CTs 05/12/2017-no evidence of recurrent disease, right inguinal hernia containing bladder Colonoscopy 07/21/2017, 3 polyps were removed from the descending and transverse colon, fragments of tubular and tubulovillous adenoma CTs 05/19/2018-no evidence of recurrent disease, status post hernia repair, right upper lobe pneumonia CTs 05/20/2019-resolution of right upper lobe pneumonia, no evidence of metastatic disease Colonoscopy 01/25/2020-multiple polyps removed-tubular adenomas, poor preparation, repeat colonoscopy recommended CTs neck, chest, and abdomen/pelvis 08/27/2020- new 2 centimeter left axillary node, new 1.9 cm left inguinal node chronic mildly prominent portacaval node Ultrasound biopsy of left inguinal node on 10/29/2020-metastatic adenocarcinoma consistent with colon adenocarcinoma PET 6/[id number] hypermetabolic left inguinal and left axillary nodes, solitary focus of hypermetabolic activity in the right lower quadrant small bowel Cycle 1 FOLFOX 12/19/2020 Cycle 2 FOLFOX 01/02/2021, oxaliplatin dose reduced secondary to neutropenia and thrombocytopenia Cycle 3 FOLFOX 01/16/2021 Cycle 4 FOLFOX  01/30/2021 CTs 02/11/2021-no change in left inguinal and left  axillary nodes, no evidence of disease progression, stable wall thickening involving a loop of distal ileum Cycle 1 FOLFIRI/Avastin 02/27/2021 Cycle 2 FOLFIRI/Avastin 03/13/2021 Cycle 3 FOLFIRI/Avastin 04/03/2021 Cycle 4 FOLFIRI 04/17/2021, Avastin held due to elevated urine protein 05/07/2021-24-hour urine protein elevated 1562 Cycle 5 FOLFIRI 05/08/2021, Avastin held due to elevated urine protein, irinotecan dose reduced due to in general not feeling well after treatment CTs 05/17/2021-left axillary and left inguinal lymph nodes are smaller, no evidence of disease progression Cycle 6 FOLFIRI 05/22/2021, Avastin held due to proteinuria 06/03/2021 24-hour urine with 1.9 g of protein Cycle 7 FOLFIRI 06/12/2021, Avastin held due to proteinuria Cycle 8 FOLFIRI 07/03/2021, Avastin held due to proteinuria Cycle 9 FOLFIRI 07/24/2021, Avastin held due to proteinuria Cycle 10 FOLFIRI 08/19/2021, Avastin held due to proteinuria CTs 09/02/2021-no change in the left axillary, left inguinal, and portacaval lymph nodes.  No mention of the distal small bowel thickening Maintenance 5-fluorouracil 09/18/2021 CT abdomen/pelvis 10/28/2021-enlarged left inguinal lymph node, stable; stomach and small bowel grossly unremarkable. Maintenance 5-fluorouracil continued Colonoscopy 12/10/2021-terminal ileum with an ulcerated partially obstructing medium-sized mass approximately 15 to 20 cm from the IC valve, invasive adenocarcinoma, colonic type, moderately differentiated (grade 2) PET scan 12/18/2021-progression of disease in the peritoneal space.  2 intensely hypermetabolic nodes in the left axilla, one node is new from the prior, the other is reduced in size and metabolic activity; decrease in size and metabolic activity of left inguinal adenopathy; intense metabolic activity associate with a loop of small bowel in the right lower quadrant, no interval change; new small hypermetabolic peritoneal implant along the ventral peritoneal surface  just right of midline; larger new peritoneal implant in the deep right pelvis; small new implant in the right mid abdomen Encorafenib/Panitumumab 01/08/2022 CTs 03/17/2022-decrease size of mesenteric implant at the posterior bladder wall, small mesenteric peritoneal lesions identified on PET/CT or not evident.  Decrease size of left inguinal left axillary nodes, hypermetabolic activity noted at the anterior left prostate on comparison August 2023 PET   2.   Chronic renal insufficiency   3.   Hypertension   4.  Inflamed sebaceous cyst at the upper back-status post incision and drainage   5.  Thrombocytopenia secondary chemotherapy-oxaliplatin dose reduced beginning with cycle 3 FOLFOX, oxaliplatin held with cycle 4 and cycle 7 FOLFOX   6.  Oxaliplatin neuropathy-improved   7.  History of Mucositis secondary chemotherapy   8.  Symptoms of pneumonia January 2020-CT 05/19/2018 consistent with right upper lobe pneumonia, Levaquin prescribed 05/20/2018   9.  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  Anemia, progressive 10/29/2021 2 units packed red blood cells 10/31/2021 Ferritin low 10/29/2021-oral iron Ferritin low 01/22/2022   12.  Colonoscopy 12/10/2021-terminal ileum with an ulcerated partially obstructing medium-sized mass approximately 15 to 20 cm from the IC valve, invasive adenocarcinoma, colonic type, moderately differentiated (grade 2)         Disposition: Louis Lobo appears unchanged.  He is tolerating the panitumumab and encorafenib well.  The rash appears unchanged.  He will receive IV magnesium today.  He continues a home magnesium supplement.  He does not like the taste of the current iron supplement.  He plans to change to a capsule.  He will return for an office visit and panitumumab in 2 weeks.He will be scheduled for restaging CTs in late February or early March.  Louis Coder, MD  05/28/2022  10:16 AM

## 2022-05-29 ENCOUNTER — Other Ambulatory Visit: Payer: Self-pay

## 2022-05-30 ENCOUNTER — Other Ambulatory Visit: Payer: Self-pay

## 2022-05-30 NOTE — Telephone Encounter (Signed)
Received notification from [employer] that grant funding was open for patient's disease state. Checked funding and the only open grant funding did not cover patient's medication. Called [employer] back and informed them of this. They are continuing to process application. I will continue to follow and update until final determination.   Berdine Addison, [facility] Patient [place]  [phone] (phone) [phone] (fax) 05/30/2022 11:14 AM

## 2022-06-09 ENCOUNTER — Encounter: Payer: Self-pay | Admitting: Oncology

## 2022-06-10 ENCOUNTER — Other Ambulatory Visit: Payer: Self-pay

## 2022-06-10 NOTE — Telephone Encounter (Signed)
Received notification from [employer] that grant funds were open. Checked, again, and no open funds for are available for patient's disease state. Called [employer] back and informed them. Informed by representative that they sent application for review with the notation that there are no open funds. I will continue to follow and update until final determination.   Berdine Addison, [facility] Patient [place]  [phone] (phone) [phone] (fax) 06/10/2022 2:57 PM

## 2022-06-11 ENCOUNTER — Encounter: Payer: Self-pay | Admitting: *Deleted

## 2022-06-11 ENCOUNTER — Inpatient Hospital Stay (HOSPITAL_BASED_OUTPATIENT_CLINIC_OR_DEPARTMENT_OTHER): Payer: Medicare Other | Admitting: Oncology

## 2022-06-11 ENCOUNTER — Inpatient Hospital Stay: Payer: Medicare Other

## 2022-06-11 ENCOUNTER — Inpatient Hospital Stay: Payer: Medicare Other | Attending: Oncology

## 2022-06-11 ENCOUNTER — Other Ambulatory Visit: Payer: Self-pay | Admitting: *Deleted

## 2022-06-11 VITALS — BP 137/60 | HR 76 | Temp 98.1°F | Resp 18 | Ht 76.0 in | Wt 227.0 lb

## 2022-06-11 DIAGNOSIS — G620 Drug-induced polyneuropathy: Secondary | ICD-10-CM | POA: Insufficient documentation

## 2022-06-11 DIAGNOSIS — D649 Anemia, unspecified: Secondary | ICD-10-CM | POA: Diagnosis not present

## 2022-06-11 DIAGNOSIS — C189 Malignant neoplasm of colon, unspecified: Secondary | ICD-10-CM

## 2022-06-11 DIAGNOSIS — C774 Secondary and unspecified malignant neoplasm of inguinal and lower limb lymph nodes: Secondary | ICD-10-CM | POA: Diagnosis not present

## 2022-06-11 DIAGNOSIS — N189 Chronic kidney disease, unspecified: Secondary | ICD-10-CM | POA: Diagnosis not present

## 2022-06-11 DIAGNOSIS — Z5112 Encounter for antineoplastic immunotherapy: Secondary | ICD-10-CM | POA: Diagnosis not present

## 2022-06-11 DIAGNOSIS — C187 Malignant neoplasm of sigmoid colon: Secondary | ICD-10-CM | POA: Insufficient documentation

## 2022-06-11 DIAGNOSIS — I129 Hypertensive chronic kidney disease with stage 1 through stage 4 chronic kidney disease, or unspecified chronic kidney disease: Secondary | ICD-10-CM | POA: Insufficient documentation

## 2022-06-11 DIAGNOSIS — D6959 Other secondary thrombocytopenia: Secondary | ICD-10-CM | POA: Insufficient documentation

## 2022-06-11 LAB — CMP (CANCER CENTER ONLY)
ALT: 7 U/L (ref 0–44)
AST: 6 U/L — ABNORMAL LOW (ref 15–41)
Albumin: 3.7 g/dL (ref 3.5–5.0)
Alkaline Phosphatase: 53 U/L (ref 38–126)
Anion gap: 7 (ref 5–15)
BUN: 29 mg/dL — ABNORMAL HIGH (ref 8–23)
CO2: 19 mmol/L — ABNORMAL LOW (ref 22–32)
Calcium: 8.9 mg/dL (ref 8.9–10.3)
Chloride: 113 mmol/L — ABNORMAL HIGH (ref 98–111)
Creatinine: 2.22 mg/dL — ABNORMAL HIGH (ref 0.61–1.24)
GFR, Estimated: 30 mL/min — ABNORMAL LOW (ref >60)
Glucose, Bld: 142 mg/dL — ABNORMAL HIGH (ref 70–99)
Potassium: 4.8 mmol/L (ref 3.5–5.1)
Sodium: 139 mmol/L (ref 135–145)
Total Bilirubin: 0.3 mg/dL (ref 0.3–1.2)
Total Protein: 6.9 g/dL (ref 6.5–8.1)

## 2022-06-11 LAB — CBC WITH DIFFERENTIAL (CANCER CENTER ONLY)
Abs Immature Granulocytes: 0.06 10*3/uL (ref 0.00–0.07)
Basophils Absolute: 0 10*3/uL (ref 0.0–0.1)
Basophils Relative: 1 %
Eosinophils Absolute: 0.3 10*3/uL (ref 0.0–0.5)
Eosinophils Relative: 4 %
HCT: 31.3 % — ABNORMAL LOW (ref 39.0–52.0)
Hemoglobin: 9.9 g/dL — ABNORMAL LOW (ref 13.0–17.0)
Immature Granulocytes: 1 %
Lymphocytes Relative: 14 %
Lymphs Abs: 1 10*3/uL (ref 0.7–4.0)
MCH: 28 pg (ref 26.0–34.0)
MCHC: 31.6 g/dL (ref 30.0–36.0)
MCV: 88.4 fL (ref 80.0–100.0)
Monocytes Absolute: 0.7 10*3/uL (ref 0.1–1.0)
Monocytes Relative: 10 %
Neutro Abs: 4.9 10*3/uL (ref 1.7–7.7)
Neutrophils Relative %: 70 %
Platelet Count: 178 10*3/uL (ref 150–400)
RBC: 3.54 MIL/uL — ABNORMAL LOW (ref 4.22–5.81)
RDW: 15.6 % — ABNORMAL HIGH (ref 11.5–15.5)
WBC Count: 7 10*3/uL (ref 4.0–10.5)
nRBC: 0 % (ref 0.0–0.2)

## 2022-06-11 LAB — MAGNESIUM: Magnesium: 1.5 mg/dL — ABNORMAL LOW (ref 1.7–2.4)

## 2022-06-11 MED ORDER — SODIUM CHLORIDE 0.9 % IV SOLN: Freq: Once | INTRAVENOUS | Status: AC

## 2022-06-11 MED ORDER — SODIUM CHLORIDE 0.9% FLUSH
10.0000 mL | INTRAVENOUS | Status: DC | PRN
  Administered 2022-06-11: 10 mL

## 2022-06-11 MED ORDER — SODIUM CHLORIDE 0.9 % IV SOLN
6.0000 mg/kg | Freq: Once | INTRAVENOUS | Status: AC
  Administered 2022-06-11: 600 mg via INTRAVENOUS
  Filled 2022-06-11: qty 20

## 2022-06-11 MED ORDER — HEPARIN SOD (PORK) LOCK FLUSH 100 UNIT/ML IV SOLN
500.0000 [IU] | Freq: Once | INTRAVENOUS | Status: AC | PRN
  Administered 2022-06-11: 500 [IU]

## 2022-06-11 MED ORDER — MAGNESIUM SULFATE 2 GM/50ML IV SOLN
2.0000 g | Freq: Once | INTRAVENOUS | Status: AC
  Administered 2022-06-11: 2 g via INTRAVENOUS
  Filled 2022-06-11: qty 50

## 2022-06-11 NOTE — Progress Notes (Signed)
Patient seen by Dr. Benay Spice today  Vitals are within treatment parameters.  Labs reviewed by Dr. Benay Spice and are not all within treatment parameters. Mg+ = 1.5 (OK to proceed)  Per physician team, patient is ready for treatment. Please note that modifications are being made to the treatment plan including Give 2 grams IV Mg+ today

## 2022-06-11 NOTE — Progress Notes (Signed)
[place] OFFICE PROGRESS NOTE   Diagnosis: Colon cancer INTERVAL HISTORY:   Louis [name] completed another cycle of panitumumab on 05/28/2022.  He continues encorafenib.  No diarrhea.  He has mild nausea following the panitumumab.  The nausea is relieved with Compazine.  He has a persistent rash over the face and trunk.  Objective:  Vital signs in last 24 hours:  Blood pressure 137/60, pulse 76, temperature 98.1 F (36.7 C), temperature source Oral, resp. rate 18, height '6\' 4"'$  (1.93 m), weight 227 lb (103 kg), SpO2 100 %.    HEENT: No thrush or ulcers Lymphatics: 1 cm mobile high left axillary node, possible smaller inferior left axillary node.  1 cm mobile right axillary node.  3-4 cm left inguinal node. Resp: Lungs clear bilaterally Cardio: Regular rate and rhythm GI: No hepatosplenomegaly, no mass, nontender Vascular: No leg edema  Skin: Acne type rash at the anterior chest and face  Portacath/PICC-without erythema  Lab Results:  Lab Results  Component Value Date   WBC 7.0 06/11/2022   HGB 9.9 (L) 06/11/2022   HCT 31.3 (L) 06/11/2022   MCV 88.4 06/11/2022   PLT 178 06/11/2022   NEUTROABS 4.9 06/11/2022    CMP  Lab Results  Component Value Date   NA 141 05/28/2022   K 4.7 05/28/2022   CL 113 (H) 05/28/2022   CO2 19 (L) 05/28/2022   GLUCOSE 154 (H) 05/28/2022   BUN 37 (H) 05/28/2022   CREATININE 2.41 (H) 05/28/2022   CALCIUM 8.8 (L) 05/28/2022   PROT 6.9 05/28/2022   ALBUMIN 3.7 05/28/2022   AST 7 (L) 05/28/2022   ALT 7 05/28/2022   ALKPHOS 50 05/28/2022   BILITOT 0.3 05/28/2022   GFRNONAA 27 (L) 05/28/2022   GFRAA 28 (L) 09/20/2019    Lab Results  Component Value Date   CEA1 2.61 01/02/2021   CEA 1.43 05/28/2022    Medications: I have reviewed the patient's current medications.   Assessment/Plan:  Sigmoid colon cancer, stage IV (U2V,O5D,G6Y), isolated mesenteric implant-resected, multiple tumor deposits Sigmoid/descending  resection and creation of a descending colostomy 04/10/2016 MSI-stable, no loss of mismatch repair protein expression Foundation 1- BRAF V600E positive, MS-stable, intermediate tumor mutation burden, no RAS mutation CT abdomen/pelvis 04/02/2016-no evidence of distant metastatic disease Cycle 1 FOLFOX 05/21/2016 Cycle 2 FOLFOX 06/04/2016 Cycle 3 FOLFOX 06/18/2016 Cycle 4 FOLFOX 07/02/2016 (oxaliplatin held secondary to thrombocytopenia) Cycle 5 FOLFOX 07/16/2016 Cycle 6 FOLFOX 07/30/2016 Cycle 7 FOLFOX 08/13/2016 (oxaliplatin held and 5-FU dose reduced) Cycle 8 FOLFOX 09/03/2016 (oxaliplatin held secondary to neuropathy) Cycle 9 FOLFOX 09/17/2016 (oxaliplatin held secondary to neuropathy) Cycle 10 FOLFOX 10/02/2016 Cycle 11 FOLFOX 10/15/2016 (oxaliplatin eliminated from the regimen) Cycle 12 FOLFOX 10/30/2016 (oxaliplatin eliminated) CTs 05/12/2017-no evidence of recurrent disease, right inguinal hernia containing bladder Colonoscopy 07/21/2017, 3 polyps were removed from the descending and transverse colon, fragments of tubular and tubulovillous adenoma CTs 05/19/2018-no evidence of recurrent disease, status post hernia repair, right upper lobe pneumonia CTs 05/20/2019-resolution of right upper lobe pneumonia, no evidence of metastatic disease Colonoscopy 01/25/2020-multiple polyps removed-tubular adenomas, poor preparation, repeat colonoscopy recommended CTs neck, chest, and abdomen/pelvis 08/27/2020- new 2 centimeter left axillary node, new 1.9 cm left inguinal node chronic mildly prominent portacaval node Ultrasound biopsy of left inguinal node on 10/29/2020-metastatic adenocarcinoma consistent with colon adenocarcinoma PET 08/05/4740-[id number] hypermetabolic left inguinal and left axillary nodes, solitary focus of hypermetabolic activity in the right lower quadrant small bowel Cycle 1 FOLFOX 12/19/2020 Cycle 2 FOLFOX 01/02/2021, oxaliplatin dose  reduced secondary to neutropenia and  thrombocytopenia Cycle 3 FOLFOX 01/16/2021 Cycle 4 FOLFOX 01/30/2021 CTs 02/11/2021-no change in left inguinal and left axillary nodes, no evidence of disease progression, stable wall thickening involving a loop of distal ileum Cycle 1 FOLFIRI/Avastin 02/27/2021 Cycle 2 FOLFIRI/Avastin 03/13/2021 Cycle 3 FOLFIRI/Avastin 04/03/2021 Cycle 4 FOLFIRI 04/17/2021, Avastin held due to elevated urine protein 05/07/2021-24-hour urine protein elevated 1562 Cycle 5 FOLFIRI 05/08/2021, Avastin held due to elevated urine protein, irinotecan dose reduced due to in general not feeling well after treatment CTs 05/17/2021-left axillary and left inguinal lymph nodes are smaller, no evidence of disease progression Cycle 6 FOLFIRI 05/22/2021, Avastin held due to proteinuria 06/03/2021 24-hour urine with 1.9 g of protein Cycle 7 FOLFIRI 06/12/2021, Avastin held due to proteinuria Cycle 8 FOLFIRI 07/03/2021, Avastin held due to proteinuria Cycle 9 FOLFIRI 07/24/2021, Avastin held due to proteinuria Cycle 10 FOLFIRI 08/19/2021, Avastin held due to proteinuria CTs 09/02/2021-no change in the left axillary, left inguinal, and portacaval lymph nodes.  No mention of the distal small bowel thickening Maintenance 5-fluorouracil 09/18/2021 CT abdomen/pelvis 10/28/2021-enlarged left inguinal lymph node, stable; stomach and small bowel grossly unremarkable. Maintenance 5-fluorouracil continued Colonoscopy 12/10/2021-terminal ileum with an ulcerated partially obstructing medium-sized mass approximately 15 to 20 cm from the IC valve, invasive adenocarcinoma, colonic type, moderately differentiated (grade 2) PET scan 12/18/2021-progression of disease in the peritoneal space.  2 intensely hypermetabolic nodes in the left axilla, one node is new from the prior, the other is reduced in size and metabolic activity; decrease in size and metabolic activity of left inguinal adenopathy; intense metabolic activity associate with a loop of small bowel in the  right lower quadrant, no interval change; new small hypermetabolic peritoneal implant along the ventral peritoneal surface just right of midline; larger new peritoneal implant in the deep right pelvis; small new implant in the right mid abdomen Encorafenib/Panitumumab 01/08/2022 CTs 03/17/2022-decrease size of mesenteric implant at the posterior bladder wall, small mesenteric peritoneal lesions identified on PET/CT or not evident.  Decrease size of left inguinal left axillary nodes, hypermetabolic activity noted at the anterior left prostate on comparison August 2023 PET   2.   Chronic renal insufficiency   3.   Hypertension   4.  Inflamed sebaceous cyst at the upper back-status post incision and drainage   5.  Thrombocytopenia secondary chemotherapy-oxaliplatin dose reduced beginning with cycle 3 FOLFOX, oxaliplatin held with cycle 4 and cycle 7 FOLFOX   6.  Oxaliplatin neuropathy-improved   7.  History of Mucositis secondary chemotherapy   8.  Symptoms of pneumonia January 2020-CT 05/19/2018 consistent with right upper lobe pneumonia, Levaquin prescribed 05/20/2018   9.  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  Anemia, progressive 10/29/2021 2 units packed red blood cells 10/31/2021 Ferritin low 10/29/2021-oral iron Ferritin low 01/22/2022   12.  Colonoscopy 12/10/2021-terminal ileum with an ulcerated partially obstructing medium-sized mass approximately 15 to 20 cm from the IC valve, invasive adenocarcinoma, colonic type, moderately differentiated (grade 2)         Disposition: Louis [name] appears stable.  The plan is to continue panitumumab/encorafenib.  He will be out of the encorafenib over the next few days.  We will contact cancer center pharmacy to help with renewing the encorafenib.  Louis [name] will return for an office visit and panitumumab in 2 weeks.  He will undergo a restaging CT evaluation prior to treatment on  07/09/2022.  Betsy Coder,  MD  06/11/2022  10:32 AM

## 2022-06-11 NOTE — Patient Instructions (Signed)
Louis [name]   Discharge Instructions: Thank you for choosing [facility] to provide your oncology and hematology care.   If you have a lab appointment with the [facility], please go directly to the [facility] and check in at the registration area.   Wear comfortable clothing and clothing appropriate for easy access to any Portacath or PICC line.   We strive to give you quality time with your provider. You may need to reschedule your appointment if you arrive late (15 or more minutes).  Arriving late affects you and other patients whose appointments are after yours.  Also, if you miss three or more appointments without notifying the office, you may be dismissed from the clinic at the provider's discretion.      For prescription refill requests, have your pharmacy contact our office and allow 72 hours for refills to be completed.    Today you received the following chemotherapy and/or immunotherapy agents Vectibix.      To help prevent nausea and vomiting after your treatment, we encourage you to take your nausea medication as directed.  BELOW ARE SYMPTOMS THAT SHOULD BE REPORTED IMMEDIATELY: *FEVER GREATER THAN 100.4 F (38 C) OR HIGHER *CHILLS OR SWEATING *NAUSEA AND VOMITING THAT IS NOT CONTROLLED WITH YOUR NAUSEA MEDICATION *UNUSUAL SHORTNESS OF BREATH *UNUSUAL BRUISING OR BLEEDING *URINARY PROBLEMS (pain or burning when urinating, or frequent urination) *BOWEL PROBLEMS (unusual diarrhea, constipation, pain near the anus) TENDERNESS IN MOUTH AND THROAT WITH OR WITHOUT PRESENCE OF ULCERS (sore throat, sores in mouth, or a toothache) UNUSUAL RASH, SWELLING OR PAIN  UNUSUAL VAGINAL DISCHARGE OR ITCHING   Items with * indicate a potential emergency and should be followed up as soon as possible or go to the Emergency Department if any problems should occur.  Please show the CHEMOTHERAPY ALERT CARD or IMMUNOTHERAPY ALERT CARD at  check-in to the Emergency Department and triage nurse.  Should you have questions after your visit or need to cancel or reschedule your appointment, please contact [facility]  Dept: [phone]  and follow the prompts.  Office hours are 8:00 a.m. to 4:30 p.m. Monday - Friday. Please note that voicemails left after 4:00 p.m. may not be returned until the following business day.  We are closed weekends and major holidays. You have access to a nurse at all times for urgent questions. Please call the main number to the clinic Dept: [phone] and follow the prompts.   For any non-urgent questions, you may also contact your provider using MyChart. We now offer e-Visits for anyone 30 and older to request care online for non-urgent symptoms. For details visit [email or web address].   Also download the MyChart app! Go to the app store, search "MyChart", open the app, select [facility], and log in with your MyChart username and password.  Panitumumab Injection What is this medication? PANITUMUMAB (pan i TOOM ue mab) treats colorectal cancer. It works by blocking a protein that causes cancer cells to grow and multiply. This helps to slow or stop the spread of cancer cells. It is a monoclonal antibody. This medicine may be used for other purposes; ask your health care provider or pharmacist if you have questions. COMMON BRAND NAME(S): Vectibix What should I tell my care team before I take this medication? They need to know if you have any of these conditions: Eye disease Low levels of magnesium in the blood Lung disease An unusual or allergic reaction  to panitumumab, other medications, foods, dyes, or preservatives Pregnant or trying to get pregnant Breast-feeding How should I use this medication? This medication is injected into a vein. It is given by your care team in a hospital or clinic setting. Talk to your care team about the use of this medication in  children. Special care may be needed. Overdosage: If you think you have taken too much of this medicine contact a poison control center or emergency room at once. NOTE: This medicine is only for you. Do not share this medicine with others. What if I miss a dose? Keep appointments for follow-up doses. It is important not to miss your dose. Call your care team if you are unable to keep an appointment. What may interact with this medication? Bevacizumab This list may not describe all possible interactions. Give your health care provider a list of all the medicines, herbs, non-prescription drugs, or dietary supplements you use. Also tell them if you smoke, drink alcohol, or use illegal drugs. Some items may interact with your medicine. What should I watch for while using this medication? Your condition will be monitored carefully while you are receiving this medication. This medication may make you feel generally unwell. This is not uncommon as chemotherapy can affect healthy cells as well as cancer cells. Report any side effects. Continue your course of treatment even though you feel ill unless your care team tells you to stop. This medication can make you more sensitive to the sun. Keep out of the sun while receiving this medication and for 2 months after stopping therapy. If you cannot avoid being in the sun, wear protective clothing and sunscreen. Do not use sun lamps, tanning beds, or tanning booths. Check with your care team if you have severe diarrhea, nausea, and vomiting or if you sweat a lot. The loss of too much body fluid may make it dangerous for you to take this medication. This medication may cause serious skin reactions. They can happen weeks to months after starting the medication. Contact your care team right away if you notice fevers or flu-like symptoms with a rash. The rash may be red or purple and then turn into blisters or peeling of the skin. You may also notice a red rash with  swelling of the face, lips, or lymph nodes in your neck or under your arms. Talk to your care team if you may be pregnant. Serious birth defects can occur if you take this medication during pregnancy and for 2 months after the last dose. Contraception is recommended while taking this medication and for 2 months after the last dose. Your care team can help you find the option that works for you. Do not breastfeed while taking this medication and for 2 months after the last dose. This medication may cause infertility. Talk to your care team if you are concerned about your fertility. What side effects may I notice from receiving this medication? Side effects that you should report to your care team as soon as possible: Allergic reactions--skin rash, itching, hives, swelling of the face, lips, tongue, or throat Dry cough, shortness of breath or trouble breathing Eye pain, redness, irritation, or discharge with blurry or decreased vision Infusion reactions--chest pain, shortness of breath or trouble breathing, feeling faint or lightheaded Low magnesium level--muscle pain or cramps, unusual weakness or fatigue, fast or irregular heartbeat, tremors Low potassium level--muscle pain or cramps, unusual weakness or fatigue, fast or irregular heartbeat, constipation Redness, blistering, peeling, or loosening of  the skin, including inside the mouth Skin reactions on sun-exposed areas Side effects that usually do not require medical attention (report to your care team if they continue or are bothersome): Change in nail shape, thickness, or color Diarrhea Dry skin Fatigue Nausea Vomiting This list may not describe all possible side effects. Call your doctor for medical advice about side effects. You may report side effects to FDA at 1-800-FDA-1088. Where should I keep my medication? This medication is given in a hospital or clinic. It will not be stored at home. NOTE: This sheet is a summary. It may not  cover all possible information. If you have questions about this medicine, talk to your doctor, pharmacist, or health care provider.  2023 Elsevier/Gold Standard (2021-09-04 00:00:00)  Hypomagnesemia Hypomagnesemia is a condition in which the level of magnesium in the blood is too low. Magnesium is a mineral that is found in many foods. It is used in many different processes in the body. Hypomagnesemia can affect every organ in the body. In severe cases, it can cause life-threatening problems. What are the causes? This condition may be caused by: Not getting enough magnesium in your diet or not having enough healthy foods to eat (malnutrition). Problems with magnesium absorption in the intestines. Dehydration. Excessive use of alcohol. Vomiting. Severe or long-term (chronic) diarrhea. Some medicines, including medicines that make you urinate more often (diuretics). Certain diseases, such as kidney disease, diabetes, celiac disease, and overactive thyroid. What are the signs or symptoms? Symptoms of this condition include: Loss of appetite, nausea, and vomiting. Involuntary shaking or trembling of a body part (tremor). Muscle weakness or tingling in the arms and legs. Sudden tightening of muscles (muscle spasms). Confusion. Psychiatric issues, such as: Depression and irritability. Psychosis. A feeling of fluttering of the heart (palpitations). Seizures. These symptoms are more severe if magnesium levels drop suddenly. How is this diagnosed? This condition may be diagnosed based on: Your symptoms and medical history. A physical exam. Blood and urine tests. How is this treated? Treatment depends on the cause and the severity of the condition. It may be treated by: Taking a magnesium supplement. This can be taken in pill form. If the condition is severe, magnesium is usually given through an IV. Making changes to your diet. You may be directed to eat foods that have a lot of  magnesium, such as green leafy vegetables, peas, beans, and nuts. Not drinking alcohol. If you are struggling not to drink, ask your health care provider for help. Follow these instructions at home: Eating and drinking     Make sure that your diet includes foods with magnesium. Foods that have a lot of magnesium in them include: Green leafy vegetables, such as spinach and broccoli. Beans and peas. Nuts and seeds, such as almonds and sunflower seeds. Whole grains, such as whole grain bread and fortified cereals. Drink fluids that contain salts and minerals (electrolytes), such as sports drinks, when you are active. Do not drink alcohol. General instructions Take over-the-counter and prescription medicines only as told by your health care provider. Take magnesium supplements as directed if your health care provider tells you to take them. Have your magnesium levels monitored as told by your health care provider. Keep all follow-up visits. This is important. Contact a health care provider if: You get worse instead of better. Your symptoms return. Get help right away if: You develop severe muscle weakness. You have trouble breathing. You feel that your heart is racing. These symptoms may represent  a serious problem that is an emergency. Do not wait to see if the symptoms will go away. Get medical help right away. Call your local emergency services (911 in the [place] Do not drive yourself to the hospital. Summary Hypomagnesemia is a condition in which the level of magnesium in the blood is too low. Hypomagnesemia can affect every organ in the body. Treatment may include eating more foods that contain magnesium, taking magnesium supplements, and not drinking alcohol. Have your magnesium levels monitored as told by your health care provider. This information is not intended to replace advice given to you by your health care provider. Make sure you discuss any questions you have with your  health care provider. Document Revised: 09/18/2020 Document Reviewed: 09/18/2020 Elsevier Patient Education  [place].

## 2022-06-11 NOTE — Telephone Encounter (Signed)
Oral Oncology Patient Advocate Encounter   Received notification re-enrollment for assistance for Braftovi through [employer] has been approved. Patient may continue to receive their medication at $0 from this program.    [employer]'s phone number [phone].   Effective dates: 02.07.24 through 05/05/23  I have spoken to the patient.  Berdine Addison, [facility] Patient [place]  [phone] (phone) [phone] (fax) 06/11/2022 2:17 PM

## 2022-06-12 ENCOUNTER — Other Ambulatory Visit: Payer: Self-pay

## 2022-06-12 ENCOUNTER — Other Ambulatory Visit: Payer: Self-pay | Admitting: *Deleted

## 2022-06-12 MED ORDER — ENCORAFENIB 75 MG PO CAPS: 300.0000 mg | ORAL_CAPSULE | Freq: Every day | ORAL | 1 refills | Status: DC

## 2022-06-13 ENCOUNTER — Other Ambulatory Visit: Payer: Self-pay | Admitting: *Deleted

## 2022-06-13 DIAGNOSIS — C189 Malignant neoplasm of colon, unspecified: Secondary | ICD-10-CM

## 2022-06-24 ENCOUNTER — Inpatient Hospital Stay (HOSPITAL_BASED_OUTPATIENT_CLINIC_OR_DEPARTMENT_OTHER): Payer: Medicare Other | Admitting: Oncology

## 2022-06-24 ENCOUNTER — Inpatient Hospital Stay: Payer: Medicare Other

## 2022-06-24 ENCOUNTER — Encounter: Payer: Self-pay | Admitting: *Deleted

## 2022-06-24 VITALS — BP 157/72 | HR 84 | Temp 98.2°F | Resp 20 | Ht 76.0 in | Wt 225.0 lb

## 2022-06-24 DIAGNOSIS — C189 Malignant neoplasm of colon, unspecified: Secondary | ICD-10-CM | POA: Diagnosis not present

## 2022-06-24 DIAGNOSIS — I129 Hypertensive chronic kidney disease with stage 1 through stage 4 chronic kidney disease, or unspecified chronic kidney disease: Secondary | ICD-10-CM | POA: Diagnosis not present

## 2022-06-24 DIAGNOSIS — D6959 Other secondary thrombocytopenia: Secondary | ICD-10-CM | POA: Diagnosis not present

## 2022-06-24 DIAGNOSIS — C187 Malignant neoplasm of sigmoid colon: Secondary | ICD-10-CM | POA: Diagnosis not present

## 2022-06-24 DIAGNOSIS — C774 Secondary and unspecified malignant neoplasm of inguinal and lower limb lymph nodes: Secondary | ICD-10-CM | POA: Diagnosis not present

## 2022-06-24 DIAGNOSIS — N189 Chronic kidney disease, unspecified: Secondary | ICD-10-CM | POA: Diagnosis not present

## 2022-06-24 DIAGNOSIS — Z5112 Encounter for antineoplastic immunotherapy: Secondary | ICD-10-CM | POA: Diagnosis not present

## 2022-06-24 LAB — CBC WITH DIFFERENTIAL (CANCER CENTER ONLY)
Abs Immature Granulocytes: 0.03 10*3/uL (ref 0.00–0.07)
Basophils Absolute: 0 10*3/uL (ref 0.0–0.1)
Basophils Relative: 1 %
Eosinophils Absolute: 0.3 10*3/uL (ref 0.0–0.5)
Eosinophils Relative: 4 %
HCT: 29.3 % — ABNORMAL LOW (ref 39.0–52.0)
Hemoglobin: 9.3 g/dL — ABNORMAL LOW (ref 13.0–17.0)
Immature Granulocytes: 0 %
Lymphocytes Relative: 14 %
Lymphs Abs: 1.1 10*3/uL (ref 0.7–4.0)
MCH: 27.9 pg (ref 26.0–34.0)
MCHC: 31.7 g/dL (ref 30.0–36.0)
MCV: 88 fL (ref 80.0–100.0)
Monocytes Absolute: 1.1 10*3/uL — ABNORMAL HIGH (ref 0.1–1.0)
Monocytes Relative: 13 %
Neutro Abs: 5.6 10*3/uL (ref 1.7–7.7)
Neutrophils Relative %: 68 %
Platelet Count: 180 10*3/uL (ref 150–400)
RBC: 3.33 MIL/uL — ABNORMAL LOW (ref 4.22–5.81)
RDW: 15.4 % (ref 11.5–15.5)
WBC Count: 8.1 10*3/uL (ref 4.0–10.5)
nRBC: 0 % (ref 0.0–0.2)

## 2022-06-24 LAB — CMP (CANCER CENTER ONLY)
ALT: 6 U/L (ref 0–44)
AST: 6 U/L — ABNORMAL LOW (ref 15–41)
Albumin: 3.6 g/dL (ref 3.5–5.0)
Alkaline Phosphatase: 63 U/L (ref 38–126)
Anion gap: 7 (ref 5–15)
BUN: 43 mg/dL — ABNORMAL HIGH (ref 8–23)
CO2: 20 mmol/L — ABNORMAL LOW (ref 22–32)
Calcium: 8.9 mg/dL (ref 8.9–10.3)
Chloride: 113 mmol/L — ABNORMAL HIGH (ref 98–111)
Creatinine: 2.59 mg/dL — ABNORMAL HIGH (ref 0.61–1.24)
GFR, Estimated: 25 mL/min — ABNORMAL LOW (ref >60)
Glucose, Bld: 113 mg/dL — ABNORMAL HIGH (ref 70–99)
Potassium: 5 mmol/L (ref 3.5–5.1)
Sodium: 140 mmol/L (ref 135–145)
Total Bilirubin: 0.2 mg/dL — ABNORMAL LOW (ref 0.3–1.2)
Total Protein: 6.8 g/dL (ref 6.5–8.1)

## 2022-06-24 LAB — MAGNESIUM: Magnesium: 1.5 mg/dL — ABNORMAL LOW (ref 1.7–2.4)

## 2022-06-24 LAB — CEA (ACCESS): CEA (CHCC): 1.47 ng/mL (ref 0.00–5.00)

## 2022-06-24 MED ORDER — SODIUM CHLORIDE 0.9 % IV SOLN
6.0000 mg/kg | Freq: Once | INTRAVENOUS | Status: AC
  Administered 2022-06-24: 600 mg via INTRAVENOUS
  Filled 2022-06-24: qty 20

## 2022-06-24 MED ORDER — HEPARIN SOD (PORK) LOCK FLUSH 100 UNIT/ML IV SOLN
500.0000 [IU] | Freq: Once | INTRAVENOUS | Status: AC | PRN
  Administered 2022-06-24: 500 [IU]

## 2022-06-24 MED ORDER — SODIUM CHLORIDE 0.9 % IV SOLN: Freq: Once | INTRAVENOUS | Status: AC

## 2022-06-24 MED ORDER — SODIUM CHLORIDE 0.9% FLUSH
10.0000 mL | INTRAVENOUS | Status: DC | PRN
  Administered 2022-06-24: 10 mL

## 2022-06-24 MED ORDER — MAGNESIUM SULFATE 2 GM/50ML IV SOLN
2.0000 g | Freq: Once | INTRAVENOUS | Status: AC
  Administered 2022-06-24: 2 g via INTRAVENOUS
  Filled 2022-06-24: qty 50

## 2022-06-24 NOTE — Patient Instructions (Signed)

## 2022-06-24 NOTE — Progress Notes (Signed)
[facility] OFFICE PROGRESS NOTE   Diagnosis: Colon cancer  INTERVAL HISTORY:   Louis [name] returns as scheduled.  He continues encorafenib.  He was last treated with panitumumab on 06/11/2022.  He has a persistent skin rash.  He has developed a rash over the scalp.  No significant diarrhea.  He reports mild nausea following panitumumab, relieved with Compazine.  Objective:  Vital signs in last 24 hours:  Blood pressure (!) 157/72, pulse 84, temperature 98.2 F (36.8 C), temperature source Oral, resp. rate 20, height 6' 4"$  (1.93 m), weight 225 lb (102.1 kg), SpO2 100 %.    HEENT: No thrush or ulcers Lymphatics: 1 cm mobile medial left axillary node, 3-4 cm firm left inguinal node Resp: Lungs clear bilaterally Cardio: Regular rate and rhythm GI: Nontender, no mass, no hepatosplenomegaly Vascular: No leg edema  Skin: Pustular rash over the chest and face, crusted # over the parietal scalp  Portacath/PICC-without erythema  Lab Results:  Lab Results  Component Value Date   WBC 8.1 06/24/2022   HGB 9.3 (L) 06/24/2022   HCT 29.3 (L) 06/24/2022   MCV 88.0 06/24/2022   PLT 180 06/24/2022   NEUTROABS 5.6 06/24/2022    CMP  Lab Results  Component Value Date   NA 140 06/24/2022   K 5.0 06/24/2022   CL 113 (H) 06/24/2022   CO2 20 (L) 06/24/2022   GLUCOSE 113 (H) 06/24/2022   BUN 43 (H) 06/24/2022   CREATININE 2.59 (H) 06/24/2022   CALCIUM 8.9 06/24/2022   PROT 6.8 06/24/2022   ALBUMIN 3.6 06/24/2022   AST 6 (L) 06/24/2022   ALT 6 06/24/2022   ALKPHOS 63 06/24/2022   BILITOT 0.2 (L) 06/24/2022   GFRNONAA 25 (L) 06/24/2022   GFRAA 28 (L) 09/20/2019    Lab Results  Component Value Date   CEA1 2.61 01/02/2021   CEA 1.47 06/24/2022     Medications: I have reviewed the patient's current medications.   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), isolated mesenteric implant-resected, multiple tumor deposits Sigmoid/descending  resection and creation of a descending colostomy 04/10/2016 MSI-stable, no loss of mismatch repair protein expression Foundation 1- BRAF V600E positive, MS-stable, intermediate tumor mutation burden, no RAS mutation CT abdomen/pelvis 04/02/2016-no evidence of distant metastatic disease Cycle 1 FOLFOX 05/21/2016 Cycle 2 FOLFOX 06/04/2016 Cycle 3 FOLFOX 06/18/2016 Cycle 4 FOLFOX 07/02/2016 (oxaliplatin held secondary to thrombocytopenia) Cycle 5 FOLFOX 07/16/2016 Cycle 6 FOLFOX 07/30/2016 Cycle 7 FOLFOX 08/13/2016 (oxaliplatin held and 5-FU dose reduced) Cycle 8 FOLFOX 09/03/2016 (oxaliplatin held secondary to neuropathy) Cycle 9 FOLFOX 09/17/2016 (oxaliplatin held secondary to neuropathy) Cycle 10 FOLFOX 10/02/2016 Cycle 11 FOLFOX 10/15/2016 (oxaliplatin eliminated from the regimen) Cycle 12 FOLFOX 10/30/2016 (oxaliplatin eliminated) CTs 05/12/2017-no evidence of recurrent disease, right inguinal hernia containing bladder Colonoscopy 07/21/2017, 3 polyps were removed from the descending and transverse colon, fragments of tubular and tubulovillous adenoma CTs 05/19/2018-no evidence of recurrent disease, status post hernia repair, right upper lobe pneumonia CTs 05/20/2019-resolution of right upper lobe pneumonia, no evidence of metastatic disease Colonoscopy 01/25/2020-multiple polyps removed-tubular adenomas, poor preparation, repeat colonoscopy recommended CTs neck, chest, and abdomen/pelvis 08/27/2020- new 2 centimeter left axillary node, new 1.9 cm left inguinal node chronic mildly prominent portacaval node Ultrasound biopsy of left inguinal node on 10/29/2020-metastatic adenocarcinoma consistent with colon adenocarcinoma PET 0000000 hypermetabolic left inguinal and left axillary nodes, solitary focus of hypermetabolic activity in the right lower quadrant small bowel Cycle 1 FOLFOX 12/19/2020 Cycle 2 FOLFOX 01/02/2021, oxaliplatin dose reduced  secondary to neutropenia and  thrombocytopenia Cycle 3 FOLFOX 01/16/2021 Cycle 4 FOLFOX 01/30/2021 CTs 02/11/2021-no change in left inguinal and left axillary nodes, no evidence of disease progression, stable wall thickening involving a loop of distal ileum Cycle 1 FOLFIRI/Avastin 02/27/2021 Cycle 2 FOLFIRI/Avastin 03/13/2021 Cycle 3 FOLFIRI/Avastin 04/03/2021 Cycle 4 FOLFIRI 04/17/2021, Avastin held due to elevated urine protein 05/07/2021-24-hour urine protein elevated 1562 Cycle 5 FOLFIRI 05/08/2021, Avastin held due to elevated urine protein, irinotecan dose reduced due to in general not feeling well after treatment CTs 05/17/2021-left axillary and left inguinal lymph nodes are smaller, no evidence of disease progression Cycle 6 FOLFIRI 05/22/2021, Avastin held due to proteinuria 06/03/2021 24-hour urine with 1.9 g of protein Cycle 7 FOLFIRI 06/12/2021, Avastin held due to proteinuria Cycle 8 FOLFIRI 07/03/2021, Avastin held due to proteinuria Cycle 9 FOLFIRI 07/24/2021, Avastin held due to proteinuria Cycle 10 FOLFIRI 08/19/2021, Avastin held due to proteinuria CTs 09/02/2021-no change in the left axillary, left inguinal, and portacaval lymph nodes.  No mention of the distal small bowel thickening Maintenance 5-fluorouracil 09/18/2021 CT abdomen/pelvis 10/28/2021-enlarged left inguinal lymph node, stable; stomach and small bowel grossly unremarkable. Maintenance 5-fluorouracil continued Colonoscopy 12/10/2021-terminal ileum with an ulcerated partially obstructing medium-sized mass approximately 15 to 20 cm from the IC valve, invasive adenocarcinoma, colonic type, moderately differentiated (grade 2) PET scan 12/18/2021-progression of disease in the peritoneal space.  2 intensely hypermetabolic nodes in the left axilla, one node is new from the prior, the other is reduced in size and metabolic activity; decrease in size and metabolic activity of left inguinal adenopathy; intense metabolic activity associate with a loop of small bowel in the  right lower quadrant, no interval change; new small hypermetabolic peritoneal implant along the ventral peritoneal surface just right of midline; larger new peritoneal implant in the deep right pelvis; small new implant in the right mid abdomen Encorafenib/Panitumumab 01/08/2022 CTs 03/17/2022-decrease size of mesenteric implant at the posterior bladder wall, small mesenteric peritoneal lesions identified on PET/CT or not evident.  Decrease size of left inguinal left axillary nodes, hypermetabolic activity noted at the anterior left prostate on comparison August 2023 PET   2.   Chronic renal insufficiency   3.   Hypertension   4.  Inflamed sebaceous cyst at the upper back-status post incision and drainage   5.  Thrombocytopenia secondary chemotherapy-oxaliplatin dose reduced beginning with cycle 3 FOLFOX, oxaliplatin held with cycle 4 and cycle 7 FOLFOX   6.  Oxaliplatin neuropathy-improved   7.  History of Mucositis secondary chemotherapy   8.  Symptoms of pneumonia January 2020-CT 05/19/2018 consistent with right upper lobe pneumonia, Levaquin prescribed 05/20/2018   9.  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  Anemia, progressive 10/29/2021 2 units packed red blood cells 10/31/2021 Ferritin low 10/29/2021-oral iron Ferritin low 01/22/2022   12.  Colonoscopy 12/10/2021-terminal ileum with an ulcerated partially obstructing medium-sized mass approximately 15 to 20 cm from the IC valve, invasive adenocarcinoma, colonic type, moderately differentiated (grade 2)   Disposition: Louis [name] continues to tolerate the panitumumab and encorafenib well.  He will complete another treatment with panitumumab today.  He has developed a panitumumab rash over the trunk, face, and scalp.  He declines hydrocortisone cream.  He will receive an IV magnesium supplement today.  He will undergo a restaging CT evaluation prior to an office visit on  07/09/2022.  Betsy Coder, MD  06/24/2022  12:03 PM

## 2022-06-24 NOTE — Patient Instructions (Signed)
[facility]  Discharge Instructions: Thank you for choosing [facility] to provide your oncology and hematology care.   If you have a lab appointment with the [facility], please go directly to the [facility] and check in at the registration area.   Wear comfortable clothing and clothing appropriate for easy access to any Portacath or PICC line.   We strive to give you quality time with your provider. You may need to reschedule your appointment if you arrive late (15 or more minutes).  Arriving late affects you and other patients whose appointments are after yours.  Also, if you miss three or more appointments without notifying the office, you may be dismissed from the clinic at the provider's discretion.      For prescription refill requests, have your pharmacy contact our office and allow 72 hours for refills to be completed.    Today you received the following chemotherapy and/or immunotherapy agents Vectibix      To help prevent nausea and vomiting after your treatment, we encourage you to take your nausea medication as directed.  BELOW ARE SYMPTOMS THAT SHOULD BE REPORTED IMMEDIATELY: *FEVER GREATER THAN 100.4 F (38 C) OR HIGHER *CHILLS OR SWEATING *NAUSEA AND VOMITING THAT IS NOT CONTROLLED WITH YOUR NAUSEA MEDICATION *UNUSUAL SHORTNESS OF BREATH *UNUSUAL BRUISING OR BLEEDING *URINARY PROBLEMS (pain or burning when urinating, or frequent urination) *BOWEL PROBLEMS (unusual diarrhea, constipation, pain near the anus) TENDERNESS IN MOUTH AND THROAT WITH OR WITHOUT PRESENCE OF ULCERS (sore throat, sores in mouth, or a toothache) UNUSUAL RASH, SWELLING OR PAIN  UNUSUAL VAGINAL DISCHARGE OR ITCHING   Items with * indicate a potential emergency and should be followed up as soon as possible or go to the Emergency Department if any problems should occur.  Please show the CHEMOTHERAPY ALERT CARD or IMMUNOTHERAPY ALERT CARD at check-in  to the Emergency Department and triage nurse.  Should you have questions after your visit or need to cancel or reschedule your appointment, please contact [facility]  Dept: [phone]  and follow the prompts.  Office hours are 8:00 a.m. to 4:30 p.m. Monday - Friday. Please note that voicemails left after 4:00 p.m. may not be returned until the following business day.  We are closed weekends and major holidays. You have access to a nurse at all times for urgent questions. Please call the main number to the clinic Dept: [phone] and follow the prompts.   For any non-urgent questions, you may also contact your provider using MyChart. We now offer e-Visits for anyone 37 and older to request care online for non-urgent symptoms. For details visit [email or web address].   Also download the MyChart app! Go to the app store, search "MyChart", open the app, select [facility], and log in with your MyChart username and password.  Hypomagnesemia Hypomagnesemia is a condition in which the level of magnesium in the blood is too low. Magnesium is a mineral that is found in many foods. It is used in many different processes in the body. Hypomagnesemia can affect every organ in the body. In severe cases, it can cause life-threatening problems. What are the causes? This condition may be caused by: Not getting enough magnesium in your diet or not having enough healthy foods to eat (malnutrition). Problems with magnesium absorption in the intestines. Dehydration. Excessive use of alcohol. Vomiting. Severe or long-term (chronic) diarrhea. Some medicines, including medicines that make you urinate more often (diuretics).  Certain diseases, such as kidney disease, diabetes, celiac disease, and overactive thyroid. What are the signs or symptoms? Symptoms of this condition include: Loss of appetite, nausea, and vomiting. Involuntary shaking or trembling of a body part  (tremor). Muscle weakness or tingling in the arms and legs. Sudden tightening of muscles (muscle spasms). Confusion. Psychiatric issues, such as: Depression and irritability. Psychosis. A feeling of fluttering of the heart (palpitations). Seizures. These symptoms are more severe if magnesium levels drop suddenly. How is this diagnosed? This condition may be diagnosed based on: Your symptoms and medical history. A physical exam. Blood and urine tests. How is this treated? Treatment depends on the cause and the severity of the condition. It may be treated by: Taking a magnesium supplement. This can be taken in pill form. If the condition is severe, magnesium is usually given through an IV. Making changes to your diet. You may be directed to eat foods that have a lot of magnesium, such as green leafy vegetables, peas, beans, and nuts. Not drinking alcohol. If you are struggling not to drink, ask your health care provider for help. Follow these instructions at home: Eating and drinking     Make sure that your diet includes foods with magnesium. Foods that have a lot of magnesium in them include: Green leafy vegetables, such as spinach and broccoli. Beans and peas. Nuts and seeds, such as almonds and sunflower seeds. Whole grains, such as whole grain bread and fortified cereals. Drink fluids that contain salts and minerals (electrolytes), such as sports drinks, when you are active. Do not drink alcohol. General instructions Take over-the-counter and prescription medicines only as told by your health care provider. Take magnesium supplements as directed if your health care provider tells you to take them. Have your magnesium levels monitored as told by your health care provider. Keep all follow-up visits. This is important. Contact a health care provider if: You get worse instead of better. Your symptoms return. Get help right away if: You develop severe muscle weakness. You have  trouble breathing. You feel that your heart is racing. These symptoms may represent a serious problem that is an emergency. Do not wait to see if the symptoms will go away. Get medical help right away. Call your local emergency services (911 in the [place] Do not drive yourself to the hospital. Summary Hypomagnesemia is a condition in which the level of magnesium in the blood is too low. Hypomagnesemia can affect every organ in the body. Treatment may include eating more foods that contain magnesium, taking magnesium supplements, and not drinking alcohol. Have your magnesium levels monitored as told by your health care provider. This information is not intended to replace advice given to you by your health care provider. Make sure you discuss any questions you have with your health care provider. Document Revised: 09/18/2020 Document Reviewed: 09/18/2020 Elsevier Patient Education  [place].

## 2022-06-24 NOTE — Progress Notes (Signed)
Patient seen by Dr. Benay Spice today  Vitals are within treatment parameters.  Labs reviewed by Dr. Benay Spice and are not all in treatment parameters: Creat 2.59--OK to proceed  Per physician team, patient is ready for treatment. Please note that modifications are being made to the treatment plan including Add 2 grams IV Mg+ today

## 2022-06-25 ENCOUNTER — Other Ambulatory Visit: Payer: Self-pay

## 2022-06-29 ENCOUNTER — Other Ambulatory Visit: Payer: Self-pay

## 2022-06-30 DIAGNOSIS — D631 Anemia in chronic kidney disease: Secondary | ICD-10-CM | POA: Diagnosis not present

## 2022-06-30 DIAGNOSIS — N2581 Secondary hyperparathyroidism of renal origin: Secondary | ICD-10-CM | POA: Diagnosis not present

## 2022-06-30 DIAGNOSIS — N390 Urinary tract infection, site not specified: Secondary | ICD-10-CM | POA: Diagnosis not present

## 2022-06-30 DIAGNOSIS — C187 Malignant neoplasm of sigmoid colon: Secondary | ICD-10-CM | POA: Diagnosis not present

## 2022-06-30 DIAGNOSIS — N179 Acute kidney failure, unspecified: Secondary | ICD-10-CM | POA: Diagnosis not present

## 2022-06-30 DIAGNOSIS — N1832 Chronic kidney disease, stage 3b: Secondary | ICD-10-CM | POA: Diagnosis not present

## 2022-06-30 DIAGNOSIS — I129 Hypertensive chronic kidney disease with stage 1 through stage 4 chronic kidney disease, or unspecified chronic kidney disease: Secondary | ICD-10-CM | POA: Diagnosis not present

## 2022-06-30 DIAGNOSIS — E785 Hyperlipidemia, unspecified: Secondary | ICD-10-CM | POA: Diagnosis not present

## 2022-06-30 DIAGNOSIS — E669 Obesity, unspecified: Secondary | ICD-10-CM | POA: Diagnosis not present

## 2022-07-07 ENCOUNTER — Ambulatory Visit (HOSPITAL_BASED_OUTPATIENT_CLINIC_OR_DEPARTMENT_OTHER): Payer: Medicare Other

## 2022-07-09 ENCOUNTER — Inpatient Hospital Stay: Payer: Medicare Other | Attending: Oncology

## 2022-07-09 ENCOUNTER — Inpatient Hospital Stay: Payer: Medicare Other

## 2022-07-09 ENCOUNTER — Inpatient Hospital Stay (HOSPITAL_BASED_OUTPATIENT_CLINIC_OR_DEPARTMENT_OTHER): Payer: Medicare Other | Admitting: Oncology

## 2022-07-09 ENCOUNTER — Encounter: Payer: Self-pay | Admitting: *Deleted

## 2022-07-09 VITALS — BP 141/74 | HR 73 | Temp 98.1°F | Resp 18 | Ht 76.0 in | Wt 226.6 lb

## 2022-07-09 DIAGNOSIS — C187 Malignant neoplasm of sigmoid colon: Secondary | ICD-10-CM | POA: Diagnosis not present

## 2022-07-09 DIAGNOSIS — G620 Drug-induced polyneuropathy: Secondary | ICD-10-CM | POA: Insufficient documentation

## 2022-07-09 DIAGNOSIS — C189 Malignant neoplasm of colon, unspecified: Secondary | ICD-10-CM | POA: Diagnosis not present

## 2022-07-09 DIAGNOSIS — I129 Hypertensive chronic kidney disease with stage 1 through stage 4 chronic kidney disease, or unspecified chronic kidney disease: Secondary | ICD-10-CM | POA: Diagnosis not present

## 2022-07-09 DIAGNOSIS — D6959 Other secondary thrombocytopenia: Secondary | ICD-10-CM | POA: Insufficient documentation

## 2022-07-09 DIAGNOSIS — C774 Secondary and unspecified malignant neoplasm of inguinal and lower limb lymph nodes: Secondary | ICD-10-CM | POA: Insufficient documentation

## 2022-07-09 DIAGNOSIS — D649 Anemia, unspecified: Secondary | ICD-10-CM | POA: Diagnosis not present

## 2022-07-09 DIAGNOSIS — N189 Chronic kidney disease, unspecified: Secondary | ICD-10-CM | POA: Insufficient documentation

## 2022-07-09 DIAGNOSIS — Z5112 Encounter for antineoplastic immunotherapy: Secondary | ICD-10-CM | POA: Insufficient documentation

## 2022-07-09 LAB — CMP (CANCER CENTER ONLY)
ALT: 6 U/L (ref 0–44)
AST: 7 U/L — ABNORMAL LOW (ref 15–41)
Albumin: 3.7 g/dL (ref 3.5–5.0)
Alkaline Phosphatase: 66 U/L (ref 38–126)
Anion gap: 7 (ref 5–15)
BUN: 36 mg/dL — ABNORMAL HIGH (ref 8–23)
CO2: 19 mmol/L — ABNORMAL LOW (ref 22–32)
Calcium: 8.8 mg/dL — ABNORMAL LOW (ref 8.9–10.3)
Chloride: 113 mmol/L — ABNORMAL HIGH (ref 98–111)
Creatinine: 2.41 mg/dL — ABNORMAL HIGH (ref 0.61–1.24)
GFR, Estimated: 27 mL/min — ABNORMAL LOW (ref >60)
Glucose, Bld: 140 mg/dL — ABNORMAL HIGH (ref 70–99)
Potassium: 4.8 mmol/L (ref 3.5–5.1)
Sodium: 139 mmol/L (ref 135–145)
Total Bilirubin: 0.3 mg/dL (ref 0.3–1.2)
Total Protein: 6.8 g/dL (ref 6.5–8.1)

## 2022-07-09 LAB — CBC WITH DIFFERENTIAL (CANCER CENTER ONLY)
Abs Immature Granulocytes: 0.05 10*3/uL (ref 0.00–0.07)
Basophils Absolute: 0.1 10*3/uL (ref 0.0–0.1)
Basophils Relative: 1 %
Eosinophils Absolute: 0.2 10*3/uL (ref 0.0–0.5)
Eosinophils Relative: 3 %
HCT: 29.9 % — ABNORMAL LOW (ref 39.0–52.0)
Hemoglobin: 9.4 g/dL — ABNORMAL LOW (ref 13.0–17.0)
Immature Granulocytes: 1 %
Lymphocytes Relative: 16 %
Lymphs Abs: 1.1 10*3/uL (ref 0.7–4.0)
MCH: 27.7 pg (ref 26.0–34.0)
MCHC: 31.4 g/dL (ref 30.0–36.0)
MCV: 88.2 fL (ref 80.0–100.0)
Monocytes Absolute: 0.6 10*3/uL (ref 0.1–1.0)
Monocytes Relative: 9 %
Neutro Abs: 4.7 10*3/uL (ref 1.7–7.7)
Neutrophils Relative %: 70 %
Platelet Count: 199 10*3/uL (ref 150–400)
RBC: 3.39 MIL/uL — ABNORMAL LOW (ref 4.22–5.81)
RDW: 15.9 % — ABNORMAL HIGH (ref 11.5–15.5)
WBC Count: 6.8 10*3/uL (ref 4.0–10.5)
nRBC: 0 % (ref 0.0–0.2)

## 2022-07-09 LAB — MAGNESIUM: Magnesium: 1.6 mg/dL — ABNORMAL LOW (ref 1.7–2.4)

## 2022-07-09 MED ORDER — SODIUM CHLORIDE 0.9 % IV SOLN
6.0000 mg/kg | Freq: Once | INTRAVENOUS | Status: AC
  Administered 2022-07-09: 600 mg via INTRAVENOUS
  Filled 2022-07-09: qty 20

## 2022-07-09 MED ORDER — SODIUM CHLORIDE 0.9% FLUSH
10.0000 mL | INTRAVENOUS | Status: DC | PRN
  Administered 2022-07-09: 10 mL

## 2022-07-09 MED ORDER — HEPARIN SOD (PORK) LOCK FLUSH 100 UNIT/ML IV SOLN
500.0000 [IU] | Freq: Once | INTRAVENOUS | Status: AC | PRN
  Administered 2022-07-09: 500 [IU]

## 2022-07-09 MED ORDER — SODIUM CHLORIDE 0.9 % IV SOLN: Freq: Once | INTRAVENOUS | Status: AC

## 2022-07-09 MED ORDER — MAGNESIUM SULFATE 2 GM/50ML IV SOLN
2.0000 g | Freq: Once | INTRAVENOUS | Status: AC
  Administered 2022-07-09: 2 g via INTRAVENOUS
  Filled 2022-07-09: qty 50

## 2022-07-09 NOTE — Progress Notes (Signed)
Patient seen by Dr. Benay Spice today  Vitals are within treatment parameters.  Labs reviewed by Dr. Benay Spice and are not all within treatment parameters. Creatinine 2.41--OK to treat and Mg+ 1.6--proceed with IV Mg+  Per physician team, patient is ready for treatment. Please note that modifications are being made to the treatment plan including Addition of 2 grams IV Mg+ today

## 2022-07-09 NOTE — Patient Instructions (Signed)
[facility]   Discharge Instructions: Thank you for choosing [facility] to provide your oncology and hematology care.   If you have a lab appointment with the [facility], please go directly to the [facility] and check in at the registration area.   Wear comfortable clothing and clothing appropriate for easy access to any Portacath or PICC line.   We strive to give you quality time with your provider. You may need to reschedule your appointment if you arrive late (15 or more minutes).  Arriving late affects you and other patients whose appointments are after yours.  Also, if you miss three or more appointments without notifying the office, you may be dismissed from the clinic at the provider's discretion.      For prescription refill requests, have your pharmacy contact our office and allow 72 hours for refills to be completed.    Today you received the following chemotherapy and/or immunotherapy agents Panitumumab (VECTIBIX).      To help prevent nausea and vomiting after your treatment, we encourage you to take your nausea medication as directed.  BELOW ARE SYMPTOMS THAT SHOULD BE REPORTED IMMEDIATELY: *FEVER GREATER THAN 100.4 F (38 C) OR HIGHER *CHILLS OR SWEATING *NAUSEA AND VOMITING THAT IS NOT CONTROLLED WITH YOUR NAUSEA MEDICATION *UNUSUAL SHORTNESS OF BREATH *UNUSUAL BRUISING OR BLEEDING *URINARY PROBLEMS (pain or burning when urinating, or frequent urination) *BOWEL PROBLEMS (unusual diarrhea, constipation, pain near the anus) TENDERNESS IN MOUTH AND THROAT WITH OR WITHOUT PRESENCE OF ULCERS (sore throat, sores in mouth, or a toothache) UNUSUAL RASH, SWELLING OR PAIN  UNUSUAL VAGINAL DISCHARGE OR ITCHING   Items with * indicate a potential emergency and should be followed up as soon as possible or go to the Emergency Department if any problems should occur.  Please show the CHEMOTHERAPY ALERT CARD or IMMUNOTHERAPY ALERT  CARD at check-in to the Emergency Department and triage nurse.  Should you have questions after your visit or need to cancel or reschedule your appointment, please contact [facility]  Dept: [phone]  and follow the prompts.  Office hours are 8:00 a.m. to 4:30 p.m. Monday - Friday. Please note that voicemails left after 4:00 p.m. may not be returned until the following business day.  We are closed weekends and major holidays. You have access to a nurse at all times for urgent questions. Please call the main number to the clinic Dept: [phone] and follow the prompts.   For any non-urgent questions, you may also contact your provider using MyChart. We now offer e-Visits for anyone 10 and older to request care online for non-urgent symptoms. For details visit [email or web address].   Also download the MyChart app! Go to the app store, search "MyChart", open the app, select [facility], and log in with your MyChart username and password.  Panitumumab Injection What is this medication? PANITUMUMAB (pan i TOOM ue mab) treats colorectal cancer. It works by blocking a protein that causes cancer cells to grow and multiply. This helps to slow or stop the spread of cancer cells. It is a monoclonal antibody. This medicine may be used for other purposes; ask your health care provider or pharmacist if you have questions. COMMON BRAND NAME(S): Vectibix What should I tell my care team before I take this medication? They need to know if you have any of these conditions: Eye disease Low levels of magnesium in the blood Lung disease An unusual or allergic  reaction to panitumumab, other medications, foods, dyes, or preservatives Pregnant or trying to get pregnant Breast-feeding How should I use this medication? This medication is injected into a vein. It is given by your care team in a hospital or clinic setting. Talk to your care team about the use of this medication  in children. Special care may be needed. Overdosage: If you think you have taken too much of this medicine contact a poison control center or emergency room at once. NOTE: This medicine is only for you. Do not share this medicine with others. What if I miss a dose? Keep appointments for follow-up doses. It is important not to miss your dose. Call your care team if you are unable to keep an appointment. What may interact with this medication? Bevacizumab This list may not describe all possible interactions. Give your health care provider a list of all the medicines, herbs, non-prescription drugs, or dietary supplements you use. Also tell them if you smoke, drink alcohol, or use illegal drugs. Some items may interact with your medicine. What should I watch for while using this medication? Your condition will be monitored carefully while you are receiving this medication. This medication may make you feel generally unwell. This is not uncommon as chemotherapy can affect healthy cells as well as cancer cells. Report any side effects. Continue your course of treatment even though you feel ill unless your care team tells you to stop. This medication can make you more sensitive to the sun. Keep out of the sun while receiving this medication and for 2 months after stopping therapy. If you cannot avoid being in the sun, wear protective clothing and sunscreen. Do not use sun lamps, tanning beds, or tanning booths. Check with your care team if you have severe diarrhea, nausea, and vomiting or if you sweat a lot. The loss of too much body fluid may make it dangerous for you to take this medication. This medication may cause serious skin reactions. They can happen weeks to months after starting the medication. Contact your care team right away if you notice fevers or flu-like symptoms with a rash. The rash may be red or purple and then turn into blisters or peeling of the skin. You may also notice a red rash with  swelling of the face, lips, or lymph nodes in your neck or under your arms. Talk to your care team if you may be pregnant. Serious birth defects can occur if you take this medication during pregnancy and for 2 months after the last dose. Contraception is recommended while taking this medication and for 2 months after the last dose. Your care team can help you find the option that works for you. Do not breastfeed while taking this medication and for 2 months after the last dose. This medication may cause infertility. Talk to your care team if you are concerned about your fertility. What side effects may I notice from receiving this medication? Side effects that you should report to your care team as soon as possible: Allergic reactions--skin rash, itching, hives, swelling of the face, lips, tongue, or throat Dry cough, shortness of breath or trouble breathing Eye pain, redness, irritation, or discharge with blurry or decreased vision Infusion reactions--chest pain, shortness of breath or trouble breathing, feeling faint or lightheaded Low magnesium level--muscle pain or cramps, unusual weakness or fatigue, fast or irregular heartbeat, tremors Low potassium level--muscle pain or cramps, unusual weakness or fatigue, fast or irregular heartbeat, constipation Redness, blistering, peeling, or loosening  of the skin, including inside the mouth Skin reactions on sun-exposed areas Side effects that usually do not require medical attention (report to your care team if they continue or are bothersome): Change in nail shape, thickness, or color Diarrhea Dry skin Fatigue Nausea Vomiting This list may not describe all possible side effects. Call your doctor for medical advice about side effects. You may report side effects to FDA at 1-800-FDA-1088. Where should I keep my medication? This medication is given in a hospital or clinic. It will not be stored at home. NOTE: This sheet is a summary. It may not  cover all possible information. If you have questions about this medicine, talk to your doctor, pharmacist, or health care provider.  2023 Elsevier/Gold Standard (2021-09-02 00:00:00)  Magnesium Sulfate Injection What is this medication? MAGNESIUM SULFATE (mag NEE zee um SUL fate) prevents and treats low levels of magnesium in your body. It may also be used to prevent and treat seizures during pregnancy in people with high blood pressure disorders, such as preeclampsia or eclampsia. Magnesium plays an important role in maintaining the health of your muscles and nervous system. This medicine may be used for other purposes; ask your health care provider or pharmacist if you have questions. What should I tell my care team before I take this medication? They need to know if you have any of these conditions: Heart disease History of irregular heart beat Kidney disease An unusual or allergic reaction to magnesium sulfate, medications, foods, dyes, or preservatives Pregnant or trying to get pregnant Breast-feeding How should I use this medication? This medication is for infusion into a vein. It is given in a hospital or clinic setting. Talk to your care team about the use of this medication in children. While this medication may be prescribed for selected conditions, precautions do apply. Overdosage: If you think you have taken too much of this medicine contact a poison control center or emergency room at once. NOTE: This medicine is only for you. Do not share this medicine with others. What if I miss a dose? This does not apply. What may interact with this medication? Certain medications for anxiety or sleep Certain medications for seizures, such phenobarbital Digoxin Medications that relax muscles for surgery Narcotic medications for pain This list may not describe all possible interactions. Give your health care provider a list of all the medicines, herbs, non-prescription drugs, or dietary  supplements you use. Also tell them if you smoke, drink alcohol, or use illegal drugs. Some items may interact with your medicine. What should I watch for while using this medication? Your condition will be monitored carefully while you are receiving this medication. You may need blood work done while you are receiving this medication. What side effects may I notice from receiving this medication? Side effects that you should report to your care team as soon as possible: Allergic reactions--skin rash, itching, hives, swelling of the face, lips, tongue, or throat High magnesium level--confusion, drowsiness, facial flushing, redness, sweating, muscle weakness, fast or irregular heartbeat, trouble breathing Low blood pressure--dizziness, feeling faint or lightheaded, blurry vision Side effects that usually do not require medical attention (report to your care team if they continue or are bothersome): Headache Nausea This list may not describe all possible side effects. Call your doctor for medical advice about side effects. You may report side effects to FDA at 1-800-FDA-1088. Where should I keep my medication? This medication is given in a hospital or clinic and will not be stored  at home. NOTE: This sheet is a summary. It may not cover all possible information. If you have questions about this medicine, talk to your doctor, pharmacist, or health care provider.  2023 Elsevier/Gold Standard (2012-08-27 00:00:00)

## 2022-07-09 NOTE — Progress Notes (Signed)
Louis [name] OFFICE PROGRESS NOTE   Diagnosis: Colon cancer  INTERVAL HISTORY:   Louis [name] turns as scheduled.  He continues treatment with encorafenib and panitumumab.  No diarrhea.  Stable skin rash.  Mild nausea following panitumumab.  No new complaint.  Objective:  Vital signs in last 24 hours:  Blood pressure (!) 141/74, pulse 73, temperature 98.1 F (36.7 C), temperature source Oral, resp. rate 18, height '6\' 4"'$  (1.93 m), weight 226 lb 9.6 oz (102.8 kg), SpO2 99 %.    HEENT: No thrush or ulcers Lymphatics: Soft mobile 1-1.5 cm left axillary node, 1 cm right axillary node?,  Firm approximate 4 cm left inguinal node Resp: Clear bilaterally Cardio: Regular rate and rhythm GI: No hepatosplenomegaly, no mass, nontender Vascular: No leg edema  Skin: Acne type rash over the face and chest  Portacath/PICC-without erythema  Lab Results:  Lab Results  Component Value Date   WBC 6.8 07/09/2022   HGB 9.4 (L) 07/09/2022   HCT 29.9 (L) 07/09/2022   MCV 88.2 07/09/2022   PLT 199 07/09/2022   NEUTROABS 4.7 07/09/2022    CMP  Lab Results  Component Value Date   NA 139 07/09/2022   K 4.8 07/09/2022   CL 113 (H) 07/09/2022   CO2 19 (L) 07/09/2022   GLUCOSE 140 (H) 07/09/2022   BUN 36 (H) 07/09/2022   CREATININE 2.41 (H) 07/09/2022   CALCIUM 8.8 (L) 07/09/2022   PROT 6.8 07/09/2022   ALBUMIN 3.7 07/09/2022   AST 7 (L) 07/09/2022   ALT 6 07/09/2022   ALKPHOS 66 07/09/2022   BILITOT 0.3 07/09/2022   GFRNONAA 27 (L) 07/09/2022   GFRAA 28 (L) 09/20/2019    Lab Results  Component Value Date   CEA1 2.61 01/02/2021   CEA 1.47 06/24/2022      Medications: I have reviewed the patient's current medications.   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), isolated mesenteric implant-resected, multiple tumor deposits Sigmoid/descending resection and creation of a descending colostomy 04/10/2016 MSI-stable, no loss of mismatch repair  protein expression Foundation 1- BRAF V600E positive, MS-stable, intermediate tumor mutation burden, no RAS mutation CT abdomen/pelvis 04/02/2016-no evidence of distant metastatic disease Cycle 1 FOLFOX 05/21/2016 Cycle 2 FOLFOX 06/04/2016 Cycle 3 FOLFOX 06/18/2016 Cycle 4 FOLFOX 07/02/2016 (oxaliplatin held secondary to thrombocytopenia) Cycle 5 FOLFOX 07/16/2016 Cycle 6 FOLFOX 07/30/2016 Cycle 7 FOLFOX 08/13/2016 (oxaliplatin held and 5-FU dose reduced) Cycle 8 FOLFOX 09/03/2016 (oxaliplatin held secondary to neuropathy) Cycle 9 FOLFOX 09/17/2016 (oxaliplatin held secondary to neuropathy) Cycle 10 FOLFOX 10/02/2016 Cycle 11 FOLFOX 10/15/2016 (oxaliplatin eliminated from the regimen) Cycle 12 FOLFOX 10/30/2016 (oxaliplatin eliminated) CTs 05/12/2017-no evidence of recurrent disease, right inguinal hernia containing bladder Colonoscopy 07/21/2017, 3 polyps were removed from the descending and transverse colon, fragments of tubular and tubulovillous adenoma CTs 05/19/2018-no evidence of recurrent disease, status post hernia repair, right upper lobe pneumonia CTs 05/20/2019-resolution of right upper lobe pneumonia, no evidence of metastatic disease Colonoscopy 01/25/2020-multiple polyps removed-tubular adenomas, poor preparation, repeat colonoscopy recommended CTs neck, chest, and abdomen/pelvis 08/27/2020- new 2 centimeter left axillary node, new 1.9 cm left inguinal node chronic mildly prominent portacaval node Ultrasound biopsy of left inguinal node on 10/29/2020-metastatic adenocarcinoma consistent with colon adenocarcinoma PET 0000000 hypermetabolic left inguinal and left axillary nodes, solitary focus of hypermetabolic activity in the right lower quadrant small bowel Cycle 1 FOLFOX 12/19/2020 Cycle 2 FOLFOX 01/02/2021, oxaliplatin dose reduced secondary to neutropenia and thrombocytopenia Cycle 3 FOLFOX 01/16/2021 Cycle 4 FOLFOX 01/30/2021 CTs 02/11/2021-no  change in left inguinal and  left axillary nodes, no evidence of disease progression, stable wall thickening involving a loop of distal ileum Cycle 1 FOLFIRI/Avastin 02/27/2021 Cycle 2 FOLFIRI/Avastin 03/13/2021 Cycle 3 FOLFIRI/Avastin 04/03/2021 Cycle 4 FOLFIRI 04/17/2021, Avastin held due to elevated urine protein 05/07/2021-24-hour urine protein elevated 1562 Cycle 5 FOLFIRI 05/08/2021, Avastin held due to elevated urine protein, irinotecan dose reduced due to in general not feeling well after treatment CTs 05/17/2021-left axillary and left inguinal lymph nodes are smaller, no evidence of disease progression Cycle 6 FOLFIRI 05/22/2021, Avastin held due to proteinuria 06/03/2021 24-hour urine with 1.9 g of protein Cycle 7 FOLFIRI 06/12/2021, Avastin held due to proteinuria Cycle 8 FOLFIRI 07/03/2021, Avastin held due to proteinuria Cycle 9 FOLFIRI 07/24/2021, Avastin held due to proteinuria Cycle 10 FOLFIRI 08/19/2021, Avastin held due to proteinuria CTs 09/02/2021-no change in the left axillary, left inguinal, and portacaval lymph nodes.  No mention of the distal small bowel thickening Maintenance 5-fluorouracil 09/18/2021 CT abdomen/pelvis 10/28/2021-enlarged left inguinal lymph node, stable; stomach and small bowel grossly unremarkable. Maintenance 5-fluorouracil continued Colonoscopy 12/10/2021-terminal ileum with an ulcerated partially obstructing medium-sized mass approximately 15 to 20 cm from the IC valve, invasive adenocarcinoma, colonic type, moderately differentiated (grade 2) PET scan 12/18/2021-progression of disease in the peritoneal space.  2 intensely hypermetabolic nodes in the left axilla, one node is new from the prior, the other is reduced in size and metabolic activity; decrease in size and metabolic activity of left inguinal adenopathy; intense metabolic activity associate with a loop of small bowel in the right lower quadrant, no interval change; new small hypermetabolic peritoneal implant along the ventral peritoneal  surface just right of midline; larger new peritoneal implant in the deep right pelvis; small new implant in the right mid abdomen Encorafenib/Panitumumab 01/08/2022 CTs 03/17/2022-decrease size of mesenteric implant at the posterior bladder wall, small mesenteric peritoneal lesions identified on PET/CT or not evident.  Decrease size of left inguinal left axillary nodes, hypermetabolic activity noted at the anterior left prostate on comparison August 2023 PET   2.   Chronic renal insufficiency   3.   Hypertension   4.  Inflamed sebaceous cyst at the upper back-status post incision and drainage   5.  Thrombocytopenia secondary chemotherapy-oxaliplatin dose reduced beginning with cycle 3 FOLFOX, oxaliplatin held with cycle 4 and cycle 7 FOLFOX   6.  Oxaliplatin neuropathy-improved   7.  History of Mucositis secondary chemotherapy   8.  Symptoms of pneumonia January 2020-CT 05/19/2018 consistent with right upper lobe pneumonia, Levaquin prescribed 05/20/2018   9.  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  Anemia, progressive 10/29/2021 2 units packed red blood cells 10/31/2021 Ferritin low 10/29/2021-oral iron Ferritin low 01/22/2022   12.  Colonoscopy 12/10/2021-terminal ileum with an ulcerated partially obstructing medium-sized mass approximately 15 to 20 cm from the IC valve, invasive adenocarcinoma, colonic type, moderately differentiated (grade 2)    Disposition: Louis [name] appears unchanged.  He is tolerating the panitumumab and encorafenib well.  He was scheduled for restaging CTs earlier this week, but missed the appointment.  He will be rescheduled for CTs prior to an office visit in 2 weeks.  He will continue encorafenib.  He will receive a magnesium supplement today.  Betsy Coder, MD  07/09/2022  11:57 AM

## 2022-07-15 ENCOUNTER — Encounter: Payer: Self-pay | Admitting: Oncology

## 2022-07-21 ENCOUNTER — Ambulatory Visit (HOSPITAL_BASED_OUTPATIENT_CLINIC_OR_DEPARTMENT_OTHER)
Admission: RE | Admit: 2022-07-21 | Discharge: 2022-07-21 | Disposition: A | Discharge status: Home / Self Care | Payer: Medicare Other | Source: Ambulatory Visit | Attending: Oncology | Admitting: Oncology

## 2022-07-21 DIAGNOSIS — J8410 Pulmonary fibrosis, unspecified: Secondary | ICD-10-CM | POA: Diagnosis not present

## 2022-07-21 DIAGNOSIS — C189 Malignant neoplasm of colon, unspecified: Secondary | ICD-10-CM | POA: Insufficient documentation

## 2022-07-21 DIAGNOSIS — R918 Other nonspecific abnormal finding of lung field: Secondary | ICD-10-CM | POA: Diagnosis not present

## 2022-07-21 DIAGNOSIS — K8020 Calculus of gallbladder without cholecystitis without obstruction: Secondary | ICD-10-CM | POA: Diagnosis not present

## 2022-07-23 ENCOUNTER — Other Ambulatory Visit: Payer: Self-pay | Admitting: Hematology and Oncology

## 2022-07-23 ENCOUNTER — Inpatient Hospital Stay (HOSPITAL_BASED_OUTPATIENT_CLINIC_OR_DEPARTMENT_OTHER): Payer: Medicare Other | Admitting: Nurse Practitioner

## 2022-07-23 ENCOUNTER — Encounter: Payer: Self-pay | Admitting: Oncology

## 2022-07-23 ENCOUNTER — Inpatient Hospital Stay: Payer: Medicare Other

## 2022-07-23 ENCOUNTER — Encounter: Payer: Self-pay | Admitting: Nurse Practitioner

## 2022-07-23 VITALS — BP 144/76 | HR 57 | Resp 18

## 2022-07-23 VITALS — BP 133/59 | HR 71 | Temp 97.9°F | Resp 18 | Ht 76.0 in | Wt 221.0 lb

## 2022-07-23 DIAGNOSIS — D649 Anemia, unspecified: Secondary | ICD-10-CM | POA: Diagnosis not present

## 2022-07-23 DIAGNOSIS — N189 Chronic kidney disease, unspecified: Secondary | ICD-10-CM | POA: Diagnosis not present

## 2022-07-23 DIAGNOSIS — C189 Malignant neoplasm of colon, unspecified: Secondary | ICD-10-CM

## 2022-07-23 DIAGNOSIS — D6959 Other secondary thrombocytopenia: Secondary | ICD-10-CM | POA: Diagnosis not present

## 2022-07-23 DIAGNOSIS — C187 Malignant neoplasm of sigmoid colon: Secondary | ICD-10-CM | POA: Diagnosis not present

## 2022-07-23 DIAGNOSIS — I129 Hypertensive chronic kidney disease with stage 1 through stage 4 chronic kidney disease, or unspecified chronic kidney disease: Secondary | ICD-10-CM | POA: Diagnosis not present

## 2022-07-23 DIAGNOSIS — Z5112 Encounter for antineoplastic immunotherapy: Secondary | ICD-10-CM | POA: Diagnosis not present

## 2022-07-23 DIAGNOSIS — C774 Secondary and unspecified malignant neoplasm of inguinal and lower limb lymph nodes: Secondary | ICD-10-CM | POA: Diagnosis not present

## 2022-07-23 LAB — CBC WITH DIFFERENTIAL (CANCER CENTER ONLY)
Abs Immature Granulocytes: 0.04 10*3/uL (ref 0.00–0.07)
Basophils Absolute: 0 10*3/uL (ref 0.0–0.1)
Basophils Relative: 1 %
Eosinophils Absolute: 0.3 10*3/uL (ref 0.0–0.5)
Eosinophils Relative: 4 %
HCT: 32 % — ABNORMAL LOW (ref 39.0–52.0)
Hemoglobin: 10 g/dL — ABNORMAL LOW (ref 13.0–17.0)
Immature Granulocytes: 1 %
Lymphocytes Relative: 22 %
Lymphs Abs: 1.5 10*3/uL (ref 0.7–4.0)
MCH: 27.2 pg (ref 26.0–34.0)
MCHC: 31.3 g/dL (ref 30.0–36.0)
MCV: 87 fL (ref 80.0–100.0)
Monocytes Absolute: 0.6 10*3/uL (ref 0.1–1.0)
Monocytes Relative: 9 %
Neutro Abs: 4.2 10*3/uL (ref 1.7–7.7)
Neutrophils Relative %: 63 %
Platelet Count: 193 10*3/uL (ref 150–400)
RBC: 3.68 MIL/uL — ABNORMAL LOW (ref 4.22–5.81)
RDW: 15.9 % — ABNORMAL HIGH (ref 11.5–15.5)
WBC Count: 6.5 10*3/uL (ref 4.0–10.5)
nRBC: 0 % (ref 0.0–0.2)

## 2022-07-23 LAB — CMP (CANCER CENTER ONLY)
ALT: 6 U/L (ref 0–44)
AST: 7 U/L — ABNORMAL LOW (ref 15–41)
Albumin: 3.6 g/dL (ref 3.5–5.0)
Alkaline Phosphatase: 79 U/L (ref 38–126)
Anion gap: 6 (ref 5–15)
BUN: 37 mg/dL — ABNORMAL HIGH (ref 8–23)
CO2: 18 mmol/L — ABNORMAL LOW (ref 22–32)
Calcium: 8.5 mg/dL — ABNORMAL LOW (ref 8.9–10.3)
Chloride: 113 mmol/L — ABNORMAL HIGH (ref 98–111)
Creatinine: 2.2 mg/dL — ABNORMAL HIGH (ref 0.61–1.24)
GFR, Estimated: 30 mL/min — ABNORMAL LOW (ref >60)
Glucose, Bld: 144 mg/dL — ABNORMAL HIGH (ref 70–99)
Potassium: 4.9 mmol/L (ref 3.5–5.1)
Sodium: 137 mmol/L (ref 135–145)
Total Bilirubin: 0.3 mg/dL (ref 0.3–1.2)
Total Protein: 6.7 g/dL (ref 6.5–8.1)

## 2022-07-23 LAB — MAGNESIUM: Magnesium: 1.3 mg/dL — ABNORMAL LOW (ref 1.7–2.4)

## 2022-07-23 LAB — CEA (ACCESS): CEA (CHCC): 1.51 ng/mL (ref 0.00–5.00)

## 2022-07-23 MED ORDER — FERROUS SULFATE 325 (65 FE) MG PO TBEC: 325.0000 mg | DELAYED_RELEASE_TABLET | Freq: Two times a day (BID) | ORAL | 3 refills | Status: DC

## 2022-07-23 MED ORDER — MAGNESIUM OXIDE 400 MG PO CAPS: 400.0000 mg | ORAL_CAPSULE | Freq: Two times a day (BID) | ORAL | 2 refills | Status: DC

## 2022-07-23 MED ORDER — HEPARIN SOD (PORK) LOCK FLUSH 100 UNIT/ML IV SOLN
500.0000 [IU] | Freq: Once | INTRAVENOUS | Status: AC | PRN
  Administered 2022-07-23: 500 [IU]

## 2022-07-23 MED ORDER — SODIUM CHLORIDE 0.9 % IV SOLN: Freq: Once | INTRAVENOUS | Status: AC

## 2022-07-23 MED ORDER — MAGNESIUM SULFATE 4 GM/100ML IV SOLN
4.0000 g | Freq: Once | INTRAVENOUS | Status: AC
  Administered 2022-07-23: 4 g via INTRAVENOUS
  Filled 2022-07-23: qty 100

## 2022-07-23 MED ORDER — SODIUM CHLORIDE 0.9 % IV SOLN
6.0000 mg/kg | Freq: Once | INTRAVENOUS | Status: AC
  Administered 2022-07-23: 600 mg via INTRAVENOUS
  Filled 2022-07-23: qty 20

## 2022-07-23 MED ORDER — SODIUM CHLORIDE 0.9% FLUSH
10.0000 mL | INTRAVENOUS | Status: DC | PRN
  Administered 2022-07-23: 10 mL

## 2022-07-23 NOTE — Patient Instructions (Signed)
Louis [name]   Discharge Instructions: Thank you for choosing [facility] to provide your oncology and hematology care.   If you have a lab appointment with the [facility], please go directly to the [facility] and check in at the registration area.   Wear comfortable clothing and clothing appropriate for easy access to any Portacath or PICC line.   We strive to give you quality time with your provider. You may need to reschedule your appointment if you arrive late (15 or more minutes).  Arriving late affects you and other patients whose appointments are after yours.  Also, if you miss three or more appointments without notifying the office, you may be dismissed from the clinic at the provider's discretion.      For prescription refill requests, have your pharmacy contact our office and allow 72 hours for refills to be completed.    Today you received the following chemotherapy and/or immunotherapy agents Vectibix.      To help prevent nausea and vomiting after your treatment, we encourage you to take your nausea medication as directed.  BELOW ARE SYMPTOMS THAT SHOULD BE REPORTED IMMEDIATELY: *FEVER GREATER THAN 100.4 F (38 C) OR HIGHER *CHILLS OR SWEATING *NAUSEA AND VOMITING THAT IS NOT CONTROLLED WITH YOUR NAUSEA MEDICATION *UNUSUAL SHORTNESS OF BREATH *UNUSUAL BRUISING OR BLEEDING *URINARY PROBLEMS (pain or burning when urinating, or frequent urination) *BOWEL PROBLEMS (unusual diarrhea, constipation, pain near the anus) TENDERNESS IN MOUTH AND THROAT WITH OR WITHOUT PRESENCE OF ULCERS (sore throat, sores in mouth, or a toothache) UNUSUAL RASH, SWELLING OR PAIN  UNUSUAL VAGINAL DISCHARGE OR ITCHING   Items with * indicate a potential emergency and should be followed up as soon as possible or go to the Emergency Department if any problems should occur.  Please show the CHEMOTHERAPY ALERT CARD or IMMUNOTHERAPY ALERT CARD at  check-in to the Emergency Department and triage nurse.  Should you have questions after your visit or need to cancel or reschedule your appointment, please contact [facility]  Dept: [phone]  and follow the prompts.  Office hours are 8:00 a.m. to 4:30 p.m. Monday - Friday. Please note that voicemails left after 4:00 p.m. may not be returned until the following business day.  We are closed weekends and major holidays. You have access to a nurse at all times for urgent questions. Please call the main number to the clinic Dept: [phone] and follow the prompts.   For any non-urgent questions, you may also contact your provider using MyChart. We now offer e-Visits for anyone 30 and older to request care online for non-urgent symptoms. For details visit [email or web address].   Also download the MyChart app! Go to the app store, search "MyChart", open the app, select [facility], and log in with your MyChart username and password.  Panitumumab Injection What is this medication? PANITUMUMAB (pan i TOOM ue mab) treats colorectal cancer. It works by blocking a protein that causes cancer cells to grow and multiply. This helps to slow or stop the spread of cancer cells. It is a monoclonal antibody. This medicine may be used for other purposes; ask your health care provider or pharmacist if you have questions. COMMON BRAND NAME(S): Vectibix What should I tell my care team before I take this medication? They need to know if you have any of these conditions: Eye disease Low levels of magnesium in the blood Lung disease An unusual or allergic reaction  to panitumumab, other medications, foods, dyes, or preservatives Pregnant or trying to get pregnant Breast-feeding How should I use this medication? This medication is injected into a vein. It is given by your care team in a hospital or clinic setting. Talk to your care team about the use of this medication in  children. Special care may be needed. Overdosage: If you think you have taken too much of this medicine contact a poison control center or emergency room at once. NOTE: This medicine is only for you. Do not share this medicine with others. What if I miss a dose? Keep appointments for follow-up doses. It is important not to miss your dose. Call your care team if you are unable to keep an appointment. What may interact with this medication? Bevacizumab This list may not describe all possible interactions. Give your health care provider a list of all the medicines, herbs, non-prescription drugs, or dietary supplements you use. Also tell them if you smoke, drink alcohol, or use illegal drugs. Some items may interact with your medicine. What should I watch for while using this medication? Your condition will be monitored carefully while you are receiving this medication. This medication may make you feel generally unwell. This is not uncommon as chemotherapy can affect healthy cells as well as cancer cells. Report any side effects. Continue your course of treatment even though you feel ill unless your care team tells you to stop. This medication can make you more sensitive to the sun. Keep out of the sun while receiving this medication and for 2 months after stopping therapy. If you cannot avoid being in the sun, wear protective clothing and sunscreen. Do not use sun lamps, tanning beds, or tanning booths. Check with your care team if you have severe diarrhea, nausea, and vomiting or if you sweat a lot. The loss of too much body fluid may make it dangerous for you to take this medication. This medication may cause serious skin reactions. They can happen weeks to months after starting the medication. Contact your care team right away if you notice fevers or flu-like symptoms with a rash. The rash may be red or purple and then turn into blisters or peeling of the skin. You may also notice a red rash with  swelling of the face, lips, or lymph nodes in your neck or under your arms. Talk to your care team if you may be pregnant. Serious birth defects can occur if you take this medication during pregnancy and for 2 months after the last dose. Contraception is recommended while taking this medication and for 2 months after the last dose. Your care team can help you find the option that works for you. Do not breastfeed while taking this medication and for 2 months after the last dose. This medication may cause infertility. Talk to your care team if you are concerned about your fertility. What side effects may I notice from receiving this medication? Side effects that you should report to your care team as soon as possible: Allergic reactions--skin rash, itching, hives, swelling of the face, lips, tongue, or throat Dry cough, shortness of breath or trouble breathing Eye pain, redness, irritation, or discharge with blurry or decreased vision Infusion reactions--chest pain, shortness of breath or trouble breathing, feeling faint or lightheaded Low magnesium level--muscle pain or cramps, unusual weakness or fatigue, fast or irregular heartbeat, tremors Low potassium level--muscle pain or cramps, unusual weakness or fatigue, fast or irregular heartbeat, constipation Redness, blistering, peeling, or loosening of  the skin, including inside the mouth Skin reactions on sun-exposed areas Side effects that usually do not require medical attention (report to your care team if they continue or are bothersome): Change in nail shape, thickness, or color Diarrhea Dry skin Fatigue Nausea Vomiting This list may not describe all possible side effects. Call your doctor for medical advice about side effects. You may report side effects to FDA at 1-800-FDA-1088. Where should I keep my medication? This medication is given in a hospital or clinic. It will not be stored at home. NOTE: This sheet is a summary. It may not  cover all possible information. If you have questions about this medicine, talk to your doctor, pharmacist, or health care provider.  2023 Elsevier/Gold Standard (2021-09-02 00:00:00) Hypomagnesemia Hypomagnesemia is a condition in which the level of magnesium in the blood is too low. Magnesium is a mineral that is found in many foods. It is used in many different processes in the body. Hypomagnesemia can affect every organ in the body. In severe cases, it can cause life-threatening problems. What are the causes? This condition may be caused by: Not getting enough magnesium in your diet or not having enough healthy foods to eat (malnutrition). Problems with magnesium absorption in the intestines. Dehydration. Excessive use of alcohol. Vomiting. Severe or long-term (chronic) diarrhea. Some medicines, including medicines that make you urinate more often (diuretics). Certain diseases, such as kidney disease, diabetes, celiac disease, and overactive thyroid. What are the signs or symptoms? Symptoms of this condition include: Loss of appetite, nausea, and vomiting. Involuntary shaking or trembling of a body part (tremor). Muscle weakness or tingling in the arms and legs. Sudden tightening of muscles (muscle spasms). Confusion. Psychiatric issues, such as: Depression and irritability. Psychosis. A feeling of fluttering of the heart (palpitations). Seizures. These symptoms are more severe if magnesium levels drop suddenly. How is this diagnosed? This condition may be diagnosed based on: Your symptoms and medical history. A physical exam. Blood and urine tests. How is this treated? Treatment depends on the cause and the severity of the condition. It may be treated by: Taking a magnesium supplement. This can be taken in pill form. If the condition is severe, magnesium is usually given through an IV. Making changes to your diet. You may be directed to eat foods that have a lot of magnesium,  such as green leafy vegetables, peas, beans, and nuts. Not drinking alcohol. If you are struggling not to drink, ask your health care provider for help. Follow these instructions at home: Eating and drinking     Make sure that your diet includes foods with magnesium. Foods that have a lot of magnesium in them include: Green leafy vegetables, such as spinach and broccoli. Beans and peas. Nuts and seeds, such as almonds and sunflower seeds. Whole grains, such as whole grain bread and fortified cereals. Drink fluids that contain salts and minerals (electrolytes), such as sports drinks, when you are active. Do not drink alcohol. General instructions Take over-the-counter and prescription medicines only as told by your health care provider. Take magnesium supplements as directed if your health care provider tells you to take them. Have your magnesium levels monitored as told by your health care provider. Keep all follow-up visits. This is important. Contact a health care provider if: You get worse instead of better. Your symptoms return. Get help right away if: You develop severe muscle weakness. You have trouble breathing. You feel that your heart is racing. These symptoms may represent a  serious problem that is an emergency. Do not wait to see if the symptoms will go away. Get medical help right away. Call your local emergency services (911 in the [place] Do not drive yourself to the hospital. Summary Hypomagnesemia is a condition in which the level of magnesium in the blood is too low. Hypomagnesemia can affect every organ in the body. Treatment may include eating more foods that contain magnesium, taking magnesium supplements, and not drinking alcohol. Have your magnesium levels monitored as told by your health care provider. This information is not intended to replace advice given to you by your health care provider. Make sure you discuss any questions you have with your health care  provider. Document Revised: 09/18/2020 Document Reviewed: 09/18/2020 Elsevier Patient Education  [place].

## 2022-07-23 NOTE — Progress Notes (Signed)
[facility] OFFICE PROGRESS NOTE   Diagnosis: Colon cancer  INTERVAL HISTORY:   Louis [name] returns as scheduled.  He continues encorafenib and Panitumumab.  He continues to note an acne-like rash on his chest, neck and scalp.  No nausea or vomiting.  He had diarrhea after the CT contrast.  Objective:  Vital signs in last 24 hours:  Blood pressure (!) 133/59, pulse 71, temperature 97.9 F (36.6 C), temperature source Oral, resp. rate 18, height 6\' 4"  (1.93 m), weight 221 lb (100.2 kg), SpO2 100 %.    HEENT: No thrush or ulcers. Lymphatics: 1 to 1-1/2 cm left axillary lymph node.  Approximate 4 cm left inguinal node. Resp: Lungs clear bilaterally. Cardio: Regular rate and rhythm. GI: Abdomen soft and nontender.  No hepatosplenomegaly. Vascular: No leg edema. Skin: Acne type rash over the face and chest. Port-A-Cath without erythema.  Lab Results:  Lab Results  Component Value Date   WBC 6.5 07/23/2022   HGB 10.0 (L) 07/23/2022   HCT 32.0 (L) 07/23/2022   MCV 87.0 07/23/2022   PLT 193 07/23/2022   NEUTROABS 4.2 07/23/2022    Imaging:  CT CHEST ABDOMEN PELVIS WO CONTRAST  Result Date: 07/22/2022 CLINICAL DATA:  Restaging metastatic colon cancer. On immunotherapy. 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 Resection in 2017. * Tracking Code: BO * EXAM: CT CHEST, ABDOMEN AND PELVIS WITHOUT CONTRAST TECHNIQUE: Multidetector CT imaging of the chest, abdomen and pelvis was performed following the standard protocol without IV contrast. RADIATION DOSE REDUCTION: This exam was performed according to the departmental dose-optimization program which includes automated exposure control, adjustment of the mA and/or kV according to patient size and/or use of iterative reconstruction technique. COMPARISON:  03/17/2022 CTs. PET 12/18/2021. Clinic note of 07/09/2022. FINDINGS: CT CHEST FINDINGS Cardiovascular: Right Port-A-Cath tip low SVC. Normal heart size, without  pericardial effusion. Lad and right coronary artery calcification. Aortic valve calcification. Mediastinum/Nodes: No supraclavicular adenopathy. Left axillary node measures 10 mm on 30/2 versus 12 mm on the prior. No mediastinal or hilar adenopathy, given limitations of unenhanced CT. Lungs/Pleura: No pleural fluid. Tiny right lower lobe calcified granuloma. 3 mm right lower lobe solid nodule on 86/4 is unchanged. Vague 3 mm left lower lobe pulmonary nodule on 99/4 is unchanged. Minimal motion degradation in the lower chest. 2 mm subpleural left lower lobe pulmonary nodule on 91/4 is stable. A Peri fissural left lower lobe pulmonary nodule anteriorly measures 2 mm on 70/4 and is new. Musculoskeletal: Mild left and mild-to-moderate right gynecomastia, similar. No acute osseous abnormality. CT ABDOMEN PELVIS FINDINGS Hepatobiliary: No noncontrast CT evidence of liver metastasis. Gallstones without acute cholecystitis or biliary duct dilatation. Pancreas: Normal, without mass or ductal dilatation. Spleen: Normal in size, without focal abnormality. Adrenals/Urinary Tract: Normal adrenal glands. Mild renal cortical thinning bilaterally. Left larger than right fluid density renal lesions are likely cysts including up to 8.5 cm . In the absence of clinically indicated signs/symptoms require(s) no independent follow-up. No hydronephrosis. Bladder distension and wall irregularity.a punctate right-sided bladder stone dependently on 117/2 was present in on the prior. Stomach/Bowel: Normal stomach, without wall thickening. Surgical changes within the descending colon. Colonic stool burden suggests constipation. Normal terminal ileum and appendix. Normal small bowel. Vascular/Lymphatic: Aortic atherosclerosis. No abdominal adenopathy. Index left inguinal node measures 2.0 x 3.2 cm on 121/2 versus 1.9 x 3.2 cm on the prior (when remeasured). This suggests stability. Reproductive:  mild prostatomegaly. Other: No significant free  fluid. A right-sided pelvic implant  measures 1.3 cm on 114/2 versus 1.4 cm when remeasured in a similar fashion on the prior. 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 Lumbar spondylosis with trace L4-5 anterolisthesis. IMPRESSION: 1. Mild response to therapy. 2. Decreased size of a left axillary node and right pelvic implant. Left inguinal node measures similar. 3. No new sites of disease identified. 4. Primarily similar tiny pulmonary nodules. A Peri fissural left lower lobe nodule is not readily apparent on the prior at 2 mm. Recommend attention on follow-up. 5. Bladder distension and wall irregularity suggests a component of outlet obstruction in the setting of prostatomegaly. Punctate right sided bladder stone is similar. 6. Incidental findings, including: Coronary artery atherosclerosis. Aortic Atherosclerosis (ICD10-I70.0). Aortic valvular calcifications. Consider echocardiography to evaluate for valvular dysfunction. Cholelithiasis. Electronically Signed   By: Abigail Miyamoto M.D.   On: 07/22/2022 15:16    Medications: I have reviewed the patient's current medications.  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), isolated mesenteric implant-resected, multiple tumor deposits Sigmoid/descending resection and creation of a descending colostomy 04/10/2016 MSI-stable, no loss of mismatch repair protein expression Foundation 1- BRAF V600E positive, MS-stable, intermediate tumor mutation burden, no RAS mutation CT abdomen/pelvis 04/02/2016-no evidence of distant metastatic disease Cycle 1 FOLFOX 05/21/2016 Cycle 2 FOLFOX 06/04/2016 Cycle 3 FOLFOX 06/18/2016 Cycle 4 FOLFOX 07/02/2016 (oxaliplatin held secondary to thrombocytopenia) Cycle 5 FOLFOX 07/16/2016 Cycle 6 FOLFOX 07/30/2016 Cycle 7 FOLFOX 08/13/2016 (oxaliplatin held and 5-FU dose reduced) Cycle 8 FOLFOX 09/03/2016 (oxaliplatin held secondary to neuropathy) Cycle 9 FOLFOX 09/17/2016 (oxaliplatin held secondary to  neuropathy) Cycle 10 FOLFOX 10/02/2016 Cycle 11 FOLFOX 10/15/2016 (oxaliplatin eliminated from the regimen) Cycle 12 FOLFOX 10/30/2016 (oxaliplatin eliminated) CTs 05/12/2017-no evidence of recurrent disease, right inguinal hernia containing bladder Colonoscopy 07/21/2017, 3 polyps were removed from the descending and transverse colon, fragments of tubular and tubulovillous adenoma CTs 05/19/2018-no evidence of recurrent disease, status post hernia repair, right upper lobe pneumonia CTs 05/20/2019-resolution of right upper lobe pneumonia, no evidence of metastatic disease Colonoscopy 01/25/2020-multiple polyps removed-tubular adenomas, poor preparation, repeat colonoscopy recommended CTs neck, chest, and abdomen/pelvis 08/27/2020- new 2 centimeter left axillary node, new 1.9 cm left inguinal node chronic mildly prominent portacaval node Ultrasound biopsy of left inguinal node on 10/29/2020-metastatic adenocarcinoma consistent with colon adenocarcinoma PET 4/58/[id number] hypermetabolic left inguinal and left axillary nodes, solitary focus of hypermetabolic activity in the right lower quadrant small bowel Cycle 1 FOLFOX 12/19/2020 Cycle 2 FOLFOX 01/02/2021, oxaliplatin dose reduced secondary to neutropenia and thrombocytopenia Cycle 3 FOLFOX 01/16/2021 Cycle 4 FOLFOX 01/30/2021 CTs 02/11/2021-no change in left inguinal and left axillary nodes, no evidence of disease progression, stable wall thickening involving a loop of distal ileum Cycle 1 FOLFIRI/Avastin 02/27/2021 Cycle 2 FOLFIRI/Avastin 03/13/2021 Cycle 3 FOLFIRI/Avastin 04/03/2021 Cycle 4 FOLFIRI 04/17/2021, Avastin held due to elevated urine protein 05/07/2021-24-hour urine protein elevated 1562 Cycle 5 FOLFIRI 05/08/2021, Avastin held due to elevated urine protein, irinotecan dose reduced due to in general not feeling well after treatment CTs 05/17/2021-left axillary and left inguinal lymph nodes are smaller, no evidence of disease progression Cycle  6 FOLFIRI 05/22/2021, Avastin held due to proteinuria 06/03/2021 24-hour urine with 1.9 g of protein Cycle 7 FOLFIRI 06/12/2021, Avastin held due to proteinuria Cycle 8 FOLFIRI 07/03/2021, Avastin held due to proteinuria Cycle 9 FOLFIRI 07/24/2021, Avastin held due to proteinuria Cycle 10 FOLFIRI 08/19/2021, Avastin held due to proteinuria CTs 09/02/2021-no change in the left axillary, left inguinal, and portacaval lymph nodes.  No mention of the distal small bowel thickening Maintenance 5-fluorouracil 09/18/2021 CT abdomen/pelvis  10/28/2021-enlarged left inguinal lymph node, stable; stomach and small bowel grossly unremarkable. Maintenance 5-fluorouracil continued Colonoscopy 12/10/2021-terminal ileum with an ulcerated partially obstructing medium-sized mass approximately 15 to 20 cm from the IC valve, invasive adenocarcinoma, colonic type, moderately differentiated (grade 2) PET scan 12/18/2021-progression of disease in the peritoneal space.  2 intensely hypermetabolic nodes in the left axilla, one node is new from the prior, the other is reduced in size and metabolic activity; decrease in size and metabolic activity of left inguinal adenopathy; intense metabolic activity associate with a loop of small bowel in the right lower quadrant, no interval change; new small hypermetabolic peritoneal implant along the ventral peritoneal surface just right of midline; larger new peritoneal implant in the deep right pelvis; small new implant in the right mid abdomen Encorafenib/Panitumumab 01/08/2022 CTs 03/17/2022-decrease size of mesenteric implant at the posterior bladder wall, small mesenteric peritoneal lesions identified on PET/CT or not evident.  Decrease size of left inguinal left axillary nodes, hypermetabolic activity noted at the anterior left prostate on comparison August 2023 PET CTs 07/21/2022-decrease size of a left axillary lymph node and right pelvic implant.  Left inguinal lymph node measures smaller.  No new  sites of disease identified.  Similar tiny pulmonary nodules. Encorafenib and Panitumumab continued, Panitumumab changed from a 2-week schedule to a 3-week schedule 07/23/2022   2.   Chronic renal insufficiency   3.   Hypertension   4.  Inflamed sebaceous cyst at the upper back-status post incision and drainage   5.  Thrombocytopenia secondary chemotherapy-oxaliplatin dose reduced beginning with cycle 3 FOLFOX, oxaliplatin held with cycle 4 and cycle 7 FOLFOX   6.  Oxaliplatin neuropathy-improved   7.  History of Mucositis secondary chemotherapy   8.  Symptoms of pneumonia January 2020-CT 05/19/2018 consistent with right upper lobe pneumonia, Levaquin prescribed 05/20/2018   9.  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  Anemia, progressive 10/29/2021 2 units packed red blood cells 10/31/2021 Ferritin low 10/29/2021-oral iron Ferritin low 01/22/2022   12.  Colonoscopy 12/10/2021-terminal ileum with an ulcerated partially obstructing medium-sized mass approximately 15 to 20 cm from the IC valve, invasive adenocarcinoma, colonic type, moderately differentiated (grade 2)  Disposition: Louis [name] appears stable.  He is on active treatment with Encorafenib and Panitumumab.  Recent restaging CTs show stable to improved disease.  Plan to continue the same.  Panitumumab will be adjusted from a 2-week schedule to a 3-week schedule going forward.  CBC and chemistry panel reviewed.  Labs adequate to proceed as above.  Creatinine is stable.  Magnesium level remains low.  He will receive 4 g of IV magnesium today.  He will return for lab, follow-up, Panitumumab in 3 weeks.  He will contact the office in the interim with any problems.  Patient seen with Dr. Benay Spice.  CT images reviewed on the computer with Louis [name] and his wife.    Louis [name] ANP/GNP-BC   07/23/2022  9:47 AM This was a shared visit with Louis [name].  Mr. Zucca been  maintained on encorafenib/panitumumab since September 2023.  He has experienced a partial response to treatment.  We reviewed the CT findings and images with him.  There is no evidence of disease progression.  I recommend continuing encorafenib and panitumumab.  He has significant skin toxicity from panitumumab.  The panitumumab will be changed to a 3-week schedule.  I was present for greater than 50% of today's visit.  I performed medical decision making.  Louis Manson, MD

## 2022-07-23 NOTE — Progress Notes (Signed)
Per Leander Rams, NP ok to treat patient with Creatinine of 2.2. Magnesium 1.3 patient will receive 4g of magnesium

## 2022-07-23 NOTE — Progress Notes (Signed)
Patient seen by Ned Card NP today  Vitals are within treatment parameters.  Labs reviewed by Ned Card NP and are not all within treatment parameters. Mag is 1.3 and Creatinine mildly elevated  2.22 Per Lattie Haw it is okay to treat and will received 4g of mag.  Per physician team, patient is ready for treatment. Please note that modifications are being made to the treatment plan including 4g mag.and the order of mag is in.

## 2022-08-02 ENCOUNTER — Other Ambulatory Visit: Payer: Self-pay | Admitting: Oncology

## 2022-08-02 DIAGNOSIS — C189 Malignant neoplasm of colon, unspecified: Secondary | ICD-10-CM

## 2022-08-04 ENCOUNTER — Encounter: Payer: Self-pay | Admitting: Oncology

## 2022-08-04 ENCOUNTER — Telehealth: Payer: Self-pay | Admitting: *Deleted

## 2022-08-04 DIAGNOSIS — C189 Malignant neoplasm of colon, unspecified: Secondary | ICD-10-CM

## 2022-08-04 DIAGNOSIS — D649 Anemia, unspecified: Secondary | ICD-10-CM

## 2022-08-04 NOTE — Telephone Encounter (Signed)
Called to report he is feeling "lousy" and very run down. Wants to sleep all the time. Thinks he may need a transfusion. He agrees to lab tomorrow.

## 2022-08-05 ENCOUNTER — Other Ambulatory Visit: Payer: Self-pay

## 2022-08-05 ENCOUNTER — Inpatient Hospital Stay: Payer: Medicare Other | Attending: Oncology

## 2022-08-05 ENCOUNTER — Telehealth: Payer: Self-pay | Admitting: *Deleted

## 2022-08-05 DIAGNOSIS — N189 Chronic kidney disease, unspecified: Secondary | ICD-10-CM | POA: Diagnosis not present

## 2022-08-05 DIAGNOSIS — D649 Anemia, unspecified: Secondary | ICD-10-CM | POA: Diagnosis not present

## 2022-08-05 DIAGNOSIS — I129 Hypertensive chronic kidney disease with stage 1 through stage 4 chronic kidney disease, or unspecified chronic kidney disease: Secondary | ICD-10-CM | POA: Diagnosis not present

## 2022-08-05 DIAGNOSIS — D6959 Other secondary thrombocytopenia: Secondary | ICD-10-CM | POA: Insufficient documentation

## 2022-08-05 DIAGNOSIS — C187 Malignant neoplasm of sigmoid colon: Secondary | ICD-10-CM | POA: Insufficient documentation

## 2022-08-05 DIAGNOSIS — C774 Secondary and unspecified malignant neoplasm of inguinal and lower limb lymph nodes: Secondary | ICD-10-CM | POA: Diagnosis not present

## 2022-08-05 DIAGNOSIS — G620 Drug-induced polyneuropathy: Secondary | ICD-10-CM | POA: Diagnosis not present

## 2022-08-05 DIAGNOSIS — Z5112 Encounter for antineoplastic immunotherapy: Secondary | ICD-10-CM | POA: Insufficient documentation

## 2022-08-05 DIAGNOSIS — C189 Malignant neoplasm of colon, unspecified: Secondary | ICD-10-CM

## 2022-08-05 LAB — CBC WITH DIFFERENTIAL (CANCER CENTER ONLY)
Abs Immature Granulocytes: 0.03 10*3/uL (ref 0.00–0.07)
Basophils Absolute: 0 10*3/uL (ref 0.0–0.1)
Basophils Relative: 1 %
Eosinophils Absolute: 0.2 10*3/uL (ref 0.0–0.5)
Eosinophils Relative: 3 %
HCT: 32.1 % — ABNORMAL LOW (ref 39.0–52.0)
Hemoglobin: 10.1 g/dL — ABNORMAL LOW (ref 13.0–17.0)
Immature Granulocytes: 0 %
Lymphocytes Relative: 17 %
Lymphs Abs: 1.2 10*3/uL (ref 0.7–4.0)
MCH: 27.4 pg (ref 26.0–34.0)
MCHC: 31.5 g/dL (ref 30.0–36.0)
MCV: 87.2 fL (ref 80.0–100.0)
Monocytes Absolute: 0.7 10*3/uL (ref 0.1–1.0)
Monocytes Relative: 11 %
Neutro Abs: 4.6 10*3/uL (ref 1.7–7.7)
Neutrophils Relative %: 68 %
Platelet Count: 160 10*3/uL (ref 150–400)
RBC: 3.68 MIL/uL — ABNORMAL LOW (ref 4.22–5.81)
RDW: 16.4 % — ABNORMAL HIGH (ref 11.5–15.5)
WBC Count: 6.8 10*3/uL (ref 4.0–10.5)
nRBC: 0 % (ref 0.0–0.2)

## 2022-08-05 LAB — SAMPLE TO BLOOD BANK

## 2022-08-05 LAB — FERRITIN: Ferritin: 51 ng/mL (ref 24–336)

## 2022-08-05 NOTE — Telephone Encounter (Signed)
Notified Mr. Fok that Hgb is stable at 10.1 and ferritin is normal at 51. Labs do not explain his recent level of fatigue. Suggested he be sure his BP is not too low, since he is on multiple meds for BP. F/U as scheduled.

## 2022-08-06 ENCOUNTER — Inpatient Hospital Stay: Payer: Medicare Other | Admitting: Nurse Practitioner

## 2022-08-06 ENCOUNTER — Inpatient Hospital Stay: Payer: Medicare Other

## 2022-08-09 ENCOUNTER — Other Ambulatory Visit: Payer: Self-pay | Admitting: Oncology

## 2022-08-09 DIAGNOSIS — C189 Malignant neoplasm of colon, unspecified: Secondary | ICD-10-CM

## 2022-08-12 ENCOUNTER — Inpatient Hospital Stay: Payer: Medicare Other

## 2022-08-12 ENCOUNTER — Inpatient Hospital Stay (HOSPITAL_BASED_OUTPATIENT_CLINIC_OR_DEPARTMENT_OTHER): Payer: Medicare Other | Admitting: Oncology

## 2022-08-12 ENCOUNTER — Other Ambulatory Visit: Payer: Self-pay

## 2022-08-12 ENCOUNTER — Encounter: Payer: Self-pay | Admitting: *Deleted

## 2022-08-12 VITALS — BP 130/59 | HR 73 | Temp 98.1°F | Resp 18 | Ht 76.0 in | Wt 220.6 lb

## 2022-08-12 DIAGNOSIS — C774 Secondary and unspecified malignant neoplasm of inguinal and lower limb lymph nodes: Secondary | ICD-10-CM | POA: Diagnosis not present

## 2022-08-12 DIAGNOSIS — C189 Malignant neoplasm of colon, unspecified: Secondary | ICD-10-CM | POA: Diagnosis not present

## 2022-08-12 DIAGNOSIS — N189 Chronic kidney disease, unspecified: Secondary | ICD-10-CM | POA: Diagnosis not present

## 2022-08-12 DIAGNOSIS — I129 Hypertensive chronic kidney disease with stage 1 through stage 4 chronic kidney disease, or unspecified chronic kidney disease: Secondary | ICD-10-CM | POA: Diagnosis not present

## 2022-08-12 DIAGNOSIS — D6959 Other secondary thrombocytopenia: Secondary | ICD-10-CM | POA: Diagnosis not present

## 2022-08-12 DIAGNOSIS — D649 Anemia, unspecified: Secondary | ICD-10-CM

## 2022-08-12 DIAGNOSIS — C187 Malignant neoplasm of sigmoid colon: Secondary | ICD-10-CM | POA: Diagnosis not present

## 2022-08-12 DIAGNOSIS — Z5112 Encounter for antineoplastic immunotherapy: Secondary | ICD-10-CM | POA: Diagnosis not present

## 2022-08-12 LAB — CBC WITH DIFFERENTIAL (CANCER CENTER ONLY)
Abs Immature Granulocytes: 0.03 10*3/uL (ref 0.00–0.07)
Basophils Absolute: 0.1 10*3/uL (ref 0.0–0.1)
Basophils Relative: 1 %
Eosinophils Absolute: 0.2 10*3/uL (ref 0.0–0.5)
Eosinophils Relative: 3 %
HCT: 31.1 % — ABNORMAL LOW (ref 39.0–52.0)
Hemoglobin: 9.9 g/dL — ABNORMAL LOW (ref 13.0–17.0)
Immature Granulocytes: 1 %
Lymphocytes Relative: 18 %
Lymphs Abs: 1.1 10*3/uL (ref 0.7–4.0)
MCH: 27.4 pg (ref 26.0–34.0)
MCHC: 31.8 g/dL (ref 30.0–36.0)
MCV: 86.1 fL (ref 80.0–100.0)
Monocytes Absolute: 0.7 10*3/uL (ref 0.1–1.0)
Monocytes Relative: 11 %
Neutro Abs: 4.4 10*3/uL (ref 1.7–7.7)
Neutrophils Relative %: 66 %
Platelet Count: 193 10*3/uL (ref 150–400)
RBC: 3.61 MIL/uL — ABNORMAL LOW (ref 4.22–5.81)
RDW: 16.5 % — ABNORMAL HIGH (ref 11.5–15.5)
WBC Count: 6.5 10*3/uL (ref 4.0–10.5)
nRBC: 0 % (ref 0.0–0.2)

## 2022-08-12 LAB — CMP (CANCER CENTER ONLY)
ALT: 5 U/L (ref 0–44)
AST: 7 U/L — ABNORMAL LOW (ref 15–41)
Albumin: 3.5 g/dL (ref 3.5–5.0)
Alkaline Phosphatase: 86 U/L (ref 38–126)
Anion gap: 7 (ref 5–15)
BUN: 40 mg/dL — ABNORMAL HIGH (ref 8–23)
CO2: 20 mmol/L — ABNORMAL LOW (ref 22–32)
Calcium: 9 mg/dL (ref 8.9–10.3)
Chloride: 112 mmol/L — ABNORMAL HIGH (ref 98–111)
Creatinine: 2.28 mg/dL — ABNORMAL HIGH (ref 0.61–1.24)
GFR, Estimated: 29 mL/min — ABNORMAL LOW (ref >60)
Glucose, Bld: 121 mg/dL — ABNORMAL HIGH (ref 70–99)
Potassium: 5.2 mmol/L — ABNORMAL HIGH (ref 3.5–5.1)
Sodium: 139 mmol/L (ref 135–145)
Total Bilirubin: 0.4 mg/dL (ref 0.3–1.2)
Total Protein: 6.6 g/dL (ref 6.5–8.1)

## 2022-08-12 LAB — CEA (ACCESS): CEA (CHCC): 1.41 ng/mL (ref 0.00–5.00)

## 2022-08-12 LAB — MAGNESIUM: Magnesium: 1.4 mg/dL — ABNORMAL LOW (ref 1.7–2.4)

## 2022-08-12 MED ORDER — MAGNESIUM SULFATE 2 GM/50ML IV SOLN
2.0000 g | Freq: Once | INTRAVENOUS | Status: AC
  Administered 2022-08-12: 2 g via INTRAVENOUS

## 2022-08-12 MED ORDER — SODIUM CHLORIDE 0.9 % IV SOLN: Freq: Once | INTRAVENOUS | Status: AC

## 2022-08-12 MED ORDER — HEPARIN SOD (PORK) LOCK FLUSH 100 UNIT/ML IV SOLN
500.0000 [IU] | Freq: Once | INTRAVENOUS | Status: AC | PRN
  Administered 2022-08-12: 500 [IU]

## 2022-08-12 MED ORDER — SODIUM CHLORIDE 0.9% FLUSH
10.0000 mL | INTRAVENOUS | Status: DC | PRN
  Administered 2022-08-12: 10 mL

## 2022-08-12 MED ORDER — SODIUM CHLORIDE 0.9 % IV SOLN
6.0000 mg/kg | Freq: Once | INTRAVENOUS | Status: AC
  Administered 2022-08-12: 600 mg via INTRAVENOUS
  Filled 2022-08-12: qty 20

## 2022-08-12 NOTE — Patient Instructions (Signed)

## 2022-08-12 NOTE — Progress Notes (Signed)
Patient seen by Dr. Truett Perna today  Vitals are within treatment parameters.  Labs reviewed by Dr. Truett Perna and are not all within treatment parameters. Creatinine 2.28; Mg+ 1.4; K+ 5.2 --OK to treat  Per physician team,  Proceed w/treatment today with the following modification: Give Mg+ 2 grams.

## 2022-08-12 NOTE — Progress Notes (Signed)
[facility] OFFICE PROGRESS NOTE   Diagnosis: Colon cancer  INTERVAL HISTORY:   [name] returns as scheduled.  He completed another cycle of panitumumab on 07/23/2022.  No diarrhea.  He has nausea for several days following panitumumab.  The nausea is relieved with Compazine.  He continues encorafenib.  Objective:  Vital signs in last 24 hours:  Blood pressure (!) 130/59, pulse 73, temperature 98.1 F (36.7 C), resp. rate 18, height 6\' 4"  (1.93 m), weight 220 lb 9.6 oz (100.1 kg), SpO2 100 %.    HEENT: No thrush or ulcers Lymphatics: 1 cm left axillary node, approximate 4 cm left inguinal node Resp: Lungs clear bilaterally Cardio: Regular rate and rhythm GI: Nontender, no hepatosplenomegaly, no mass Vascular: No leg edema  Skin: Acne type rash over the anterior chest and face  Portacath/PICC-without erythema  Lab Results:  Lab Results  Component Value Date   WBC 6.5 08/12/2022   HGB 9.9 (L) 08/12/2022   HCT 31.1 (L) 08/12/2022   MCV 86.1 08/12/2022   PLT 193 08/12/2022   NEUTROABS 4.4 08/12/2022    CMP  Lab Results  Component Value Date   NA 137 07/23/2022   K 4.9 07/23/2022   CL 113 (H) 07/23/2022   CO2 18 (L) 07/23/2022   GLUCOSE 144 (H) 07/23/2022   BUN 37 (H) 07/23/2022   CREATININE 2.20 (H) 07/23/2022   CALCIUM 8.5 (L) 07/23/2022   PROT 6.7 07/23/2022   ALBUMIN 3.6 07/23/2022   AST 7 (L) 07/23/2022   ALT 6 07/23/2022   ALKPHOS 79 07/23/2022   BILITOT 0.3 07/23/2022   GFRNONAA 30 (L) 07/23/2022   GFRAA 28 (L) 09/20/2019    Lab Results  Component Value Date   CEA1 2.61 01/02/2021   CEA 1.51 07/23/2022    Medications: I have reviewed the patient's current medications.   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(T4a,N1b,M1a), isolated mesenteric implant-resected, multiple tumor deposits Sigmoid/descending resection and creation of a descending colostomy 04/10/2016 MSI-stable, no loss of mismatch repair protein  expression Foundation 1- BRAF V600E positive, MS-stable, intermediate tumor mutation burden, no RAS mutation CT abdomen/pelvis 04/02/2016-no evidence of distant metastatic disease Cycle 1 FOLFOX 05/21/2016 Cycle 2 FOLFOX 06/04/2016 Cycle 3 FOLFOX 06/18/2016 Cycle 4 FOLFOX 07/02/2016 (oxaliplatin held secondary to thrombocytopenia) Cycle 5 FOLFOX 07/16/2016 Cycle 6 FOLFOX 07/30/2016 Cycle 7 FOLFOX 08/13/2016 (oxaliplatin held and 5-FU dose reduced) Cycle 8 FOLFOX 09/03/2016 (oxaliplatin held secondary to neuropathy) Cycle 9 FOLFOX 09/17/2016 (oxaliplatin held secondary to neuropathy) Cycle 10 FOLFOX 10/02/2016 Cycle 11 FOLFOX 10/15/2016 (oxaliplatin eliminated from the regimen) Cycle 12 FOLFOX 10/30/2016 (oxaliplatin eliminated) CTs 05/12/2017-no evidence of recurrent disease, right inguinal hernia containing bladder Colonoscopy 07/21/2017, 3 polyps were removed from the descending and transverse colon, fragments of tubular and tubulovillous adenoma CTs 05/19/2018-no evidence of recurrent disease, status post hernia repair, right upper lobe pneumonia CTs 05/20/2019-resolution of right upper lobe pneumonia, no evidence of metastatic disease Colonoscopy 01/25/2020-multiple polyps removed-tubular adenomas, poor preparation, repeat colonoscopy recommended CTs neck, chest, and abdomen/pelvis 08/27/2020- new 2 centimeter left axillary node, new 1.9 cm left inguinal node chronic mildly prominent portacaval node Ultrasound biopsy of left inguinal node on 10/29/2020-metastatic adenocarcinoma consistent with colon adenocarcinoma PET 11/12/2020-enlarged hypermetabolic left inguinal and left axillary nodes, solitary focus of hypermetabolic activity in the right lower quadrant small bowel Cycle 1 FOLFOX 12/19/2020 Cycle 2 FOLFOX 01/02/2021, oxaliplatin dose reduced secondary to neutropenia and thrombocytopenia Cycle 3 FOLFOX 01/16/2021 Cycle 4 FOLFOX 01/30/2021 CTs 02/11/2021-no change in left  inguinal and left  axillary nodes, no evidence of disease progression, stable wall thickening involving a loop of distal ileum Cycle 1 FOLFIRI/Avastin 02/27/2021 Cycle 2 FOLFIRI/Avastin 03/13/2021 Cycle 3 FOLFIRI/Avastin 04/03/2021 Cycle 4 FOLFIRI 04/17/2021, Avastin held due to elevated urine protein 05/07/2021-24-hour urine protein elevated 1562 Cycle 5 FOLFIRI 05/08/2021, Avastin held due to elevated urine protein, irinotecan dose reduced due to in general not feeling well after treatment CTs 05/17/2021-left axillary and left inguinal lymph nodes are smaller, no evidence of disease progression Cycle 6 FOLFIRI 05/22/2021, Avastin held due to proteinuria 06/03/2021 24-hour urine with 1.9 g of protein Cycle 7 FOLFIRI 06/12/2021, Avastin held due to proteinuria Cycle 8 FOLFIRI 07/03/2021, Avastin held due to proteinuria Cycle 9 FOLFIRI 07/24/2021, Avastin held due to proteinuria Cycle 10 FOLFIRI 08/19/2021, Avastin held due to proteinuria CTs 09/02/2021-no change in the left axillary, left inguinal, and portacaval lymph nodes.  No mention of the distal small bowel thickening Maintenance 5-fluorouracil 09/18/2021 CT abdomen/pelvis 10/28/2021-enlarged left inguinal lymph node, stable; stomach and small bowel grossly unremarkable. Maintenance 5-fluorouracil continued Colonoscopy 12/10/2021-terminal ileum with an ulcerated partially obstructing medium-sized mass approximately 15 to 20 cm from the IC valve, invasive adenocarcinoma, colonic type, moderately differentiated (grade 2) PET scan 12/18/2021-progression of disease in the peritoneal space.  2 intensely hypermetabolic nodes in the left axilla, one node is new from the prior, the other is reduced in size and metabolic activity; decrease in size and metabolic activity of left inguinal adenopathy; intense metabolic activity associate with a loop of small bowel in the right lower quadrant, no interval change; new small hypermetabolic peritoneal implant along the ventral peritoneal surface  just right of midline; larger new peritoneal implant in the deep right pelvis; small new implant in the right mid abdomen Encorafenib/Panitumumab 01/08/2022 CTs 03/17/2022-decrease size of mesenteric implant at the posterior bladder wall, small mesenteric peritoneal lesions identified on PET/CT or not evident.  Decrease size of left inguinal left axillary nodes, hypermetabolic activity noted at the anterior left prostate on comparison August 2023 PET CTs 07/21/2022-decrease size of a left axillary lymph node and right pelvic implant.  Left inguinal lymph node measures smaller.  No new sites of disease identified.  Similar tiny pulmonary nodules. Encorafenib and Panitumumab continued, Panitumumab changed from a 2-week schedule to a 3-week schedule 07/23/2022   2.   Chronic renal insufficiency   3.   Hypertension   4.  Inflamed sebaceous cyst at the upper back-status post incision and drainage   5.  Thrombocytopenia secondary chemotherapy-oxaliplatin dose reduced beginning with cycle 3 FOLFOX, oxaliplatin held with cycle 4 and cycle 7 FOLFOX   6.  Oxaliplatin neuropathy-improved   7.  History of Mucositis secondary chemotherapy   8.  Symptoms of pneumonia January 2020-CT 05/19/2018 consistent with right upper lobe pneumonia, Levaquin prescribed 05/20/2018   9.  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  Anemia, progressive 10/29/2021 2 units packed red blood cells 10/31/2021 Ferritin low 10/29/2021-oral iron Ferritin low 01/22/2022   12.  Colonoscopy 12/10/2021-terminal ileum with an ulcerated partially obstructing medium-sized mass approximately 15 to 20 cm from the IC valve, invasive adenocarcinoma, colonic type, moderately differentiated (grade 2)   Disposition: [name] appears unchanged.  He continues to tolerate the encorafenib and panitumumab well.  He will complete another treatment with panitumumab today.  He will return for an office  visit and panitumumab in 3 weeks.  We will follow-up on the magnesium level from  today and provide intravenous supplementation as indicated.  Thornton Papas, MD  08/12/2022  11:21 AM

## 2022-08-12 NOTE — Patient Instructions (Signed)
[facility]   Discharge Instructions: Thank you for choosing [facility] to provide your oncology and hematology care.   If you have a lab appointment with the Cancer Center, please go directly to the Cancer Center and check in at the registration area.   Wear comfortable clothing and clothing appropriate for easy access to any Portacath or PICC line.   We strive to give you quality time with your provider. You may need to reschedule your appointment if you arrive late (15 or more minutes).  Arriving late affects you and other patients whose appointments are after yours.  Also, if you miss three or more appointments without notifying the office, you may be dismissed from the clinic at the provider's discretion.      For prescription refill requests, have your pharmacy contact our office and allow 72 hours for refills to be completed.    Today you received the following chemotherapy and/or immunotherapy agents Vectibix.      To help prevent nausea and vomiting after your treatment, we encourage you to take your nausea medication as directed.  BELOW ARE SYMPTOMS THAT SHOULD BE REPORTED IMMEDIATELY: *FEVER GREATER THAN 100.4 F (38 C) OR HIGHER *CHILLS OR SWEATING *NAUSEA AND VOMITING THAT IS NOT CONTROLLED WITH YOUR NAUSEA MEDICATION *UNUSUAL SHORTNESS OF BREATH *UNUSUAL BRUISING OR BLEEDING *URINARY PROBLEMS (pain or burning when urinating, or frequent urination) *BOWEL PROBLEMS (unusual diarrhea, constipation, pain near the anus) TENDERNESS IN MOUTH AND THROAT WITH OR WITHOUT PRESENCE OF ULCERS (sore throat, sores in mouth, or a toothache) UNUSUAL RASH, SWELLING OR PAIN  UNUSUAL VAGINAL DISCHARGE OR ITCHING   Items with * indicate a potential emergency and should be followed up as soon as possible or go to the Emergency Department if any problems should occur.  Please show the CHEMOTHERAPY ALERT CARD or IMMUNOTHERAPY ALERT CARD at  check-in to the Emergency Department and triage nurse.  Should you have questions after your visit or need to cancel or reschedule your appointment, please contact [facility]  Dept: [phone]  and follow the prompts.  Office hours are 8:00 a.m. to 4:30 p.m. Monday - Friday. Please note that voicemails left after 4:00 p.m. may not be returned until the following business day.  We are closed weekends and major holidays. You have access to a nurse at all times for urgent questions. Please call the main number to the clinic Dept: [phone] and follow the prompts.   For any non-urgent questions, you may also contact your provider using MyChart. We now offer e-Visits for anyone 18 and older to request care online for non-urgent symptoms. For details visit [email or web address].   Also download the MyChart app! Go to the app store, search "MyChart", open the app, select [facility], and log in with your MyChart username and password.  Panitumumab Injection What is this medication? PANITUMUMAB (pan i TOOM ue mab) treats colorectal cancer. It works by blocking a protein that causes cancer cells to grow and multiply. This helps to slow or stop the spread of cancer cells. It is a monoclonal antibody. This medicine may be used for other purposes; ask your health care provider or pharmacist if you have questions. COMMON BRAND NAME(S): Vectibix What should I tell my care team before I take this medication? They need to know if you have any of these conditions: Eye disease Low levels of magnesium in the blood Lung disease An unusual or allergic reaction   to panitumumab, other medications, foods, dyes, or preservatives Pregnant or trying to get pregnant Breast-feeding How should I use this medication? This medication is injected into a vein. It is given by your care team in a hospital or clinic setting. Talk to your care team about the use of this medication in  children. Special care may be needed. Overdosage: If you think you have taken too much of this medicine contact a poison control center or emergency room at once. NOTE: This medicine is only for you. Do not share this medicine with others. What if I miss a dose? Keep appointments for follow-up doses. It is important not to miss your dose. Call your care team if you are unable to keep an appointment. What may interact with this medication? Bevacizumab This list may not describe all possible interactions. Give your health care provider a list of all the medicines, herbs, non-prescription drugs, or dietary supplements you use. Also tell them if you smoke, drink alcohol, or use illegal drugs. Some items may interact with your medicine. What should I watch for while using this medication? Your condition will be monitored carefully while you are receiving this medication. This medication may make you feel generally unwell. This is not uncommon as chemotherapy can affect healthy cells as well as cancer cells. Report any side effects. Continue your course of treatment even though you feel ill unless your care team tells you to stop. This medication can make you more sensitive to the sun. Keep out of the sun while receiving this medication and for 2 months after stopping therapy. If you cannot avoid being in the sun, wear protective clothing and sunscreen. Do not use sun lamps, tanning beds, or tanning booths. Check with your care team if you have severe diarrhea, nausea, and vomiting or if you sweat a lot. The loss of too much body fluid may make it dangerous for you to take this medication. This medication may cause serious skin reactions. They can happen weeks to months after starting the medication. Contact your care team right away if you notice fevers or flu-like symptoms with a rash. The rash may be red or purple and then turn into blisters or peeling of the skin. You may also notice a red rash with  swelling of the face, lips, or lymph nodes in your neck or under your arms. Talk to your care team if you may be pregnant. Serious birth defects can occur if you take this medication during pregnancy and for 2 months after the last dose. Contraception is recommended while taking this medication and for 2 months after the last dose. Your care team can help you find the option that works for you. Do not breastfeed while taking this medication and for 2 months after the last dose. This medication may cause infertility. Talk to your care team if you are concerned about your fertility. What side effects may I notice from receiving this medication? Side effects that you should report to your care team as soon as possible: Allergic reactions--skin rash, itching, hives, swelling of the face, lips, tongue, or throat Dry cough, shortness of breath or trouble breathing Eye pain, redness, irritation, or discharge with blurry or decreased vision Infusion reactions--chest pain, shortness of breath or trouble breathing, feeling faint or lightheaded Low magnesium level--muscle pain or cramps, unusual weakness or fatigue, fast or irregular heartbeat, tremors Low potassium level--muscle pain or cramps, unusual weakness or fatigue, fast or irregular heartbeat, constipation Redness, blistering, peeling, or loosening of   the skin, including inside the mouth Skin reactions on sun-exposed areas Side effects that usually do not require medical attention (report to your care team if they continue or are bothersome): Change in nail shape, thickness, or color Diarrhea Dry skin Fatigue Nausea Vomiting This list may not describe all possible side effects. Call your doctor for medical advice about side effects. You may report side effects to FDA at 1-800-FDA-1088. Where should I keep my medication? This medication is given in a hospital or clinic. It will not be stored at home. NOTE: This sheet is a summary. It may not  cover all possible information. If you have questions about this medicine, talk to your doctor, pharmacist, or health care provider.  2023 Elsevier/Gold Standard (2021-09-02 00:00:00) Hypomagnesemia Hypomagnesemia is a condition in which the level of magnesium in the blood is too low. Magnesium is a mineral that is found in many foods. It is used in many different processes in the body. Hypomagnesemia can affect every organ in the body. In severe cases, it can cause life-threatening problems. What are the causes? This condition may be caused by: Not getting enough magnesium in your diet or not having enough healthy foods to eat (malnutrition). Problems with magnesium absorption in the intestines. Dehydration. Excessive use of alcohol. Vomiting. Severe or long-term (chronic) diarrhea. Some medicines, including medicines that make you urinate more often (diuretics). Certain diseases, such as kidney disease, diabetes, celiac disease, and overactive thyroid. What are the signs or symptoms? Symptoms of this condition include: Loss of appetite, nausea, and vomiting. Involuntary shaking or trembling of a body part (tremor). Muscle weakness or tingling in the arms and legs. Sudden tightening of muscles (muscle spasms). Confusion. Psychiatric issues, such as: Depression and irritability. Psychosis. A feeling of fluttering of the heart (palpitations). Seizures. These symptoms are more severe if magnesium levels drop suddenly. How is this diagnosed? This condition may be diagnosed based on: Your symptoms and medical history. A physical exam. Blood and urine tests. How is this treated? Treatment depends on the cause and the severity of the condition. It may be treated by: Taking a magnesium supplement. This can be taken in pill form. If the condition is severe, magnesium is usually given through an IV. Making changes to your diet. You may be directed to eat foods that have a lot of magnesium,  such as green leafy vegetables, peas, beans, and nuts. Not drinking alcohol. If you are struggling not to drink, ask your health care provider for help. Follow these instructions at home: Eating and drinking     Make sure that your diet includes foods with magnesium. Foods that have a lot of magnesium in them include: Green leafy vegetables, such as spinach and broccoli. Beans and peas. Nuts and seeds, such as almonds and sunflower seeds. Whole grains, such as whole grain bread and fortified cereals. Drink fluids that contain salts and minerals (electrolytes), such as sports drinks, when you are active. Do not drink alcohol. General instructions Take over-the-counter and prescription medicines only as told by your health care provider. Take magnesium supplements as directed if your health care provider tells you to take them. Have your magnesium levels monitored as told by your health care provider. Keep all follow-up visits. This is important. Contact a health care provider if: You get worse instead of better. Your symptoms return. Get help right away if: You develop severe muscle weakness. You have trouble breathing. You feel that your heart is racing. These symptoms may represent a   serious problem that is an emergency. Do not wait to see if the symptoms will go away. Get medical help right away. Call your local emergency services (911 in the [place] Do not drive yourself to the hospital. Summary Hypomagnesemia is a condition in which the level of magnesium in the blood is too low. Hypomagnesemia can affect every organ in the body. Treatment may include eating more foods that contain magnesium, taking magnesium supplements, and not drinking alcohol. Have your magnesium levels monitored as told by your health care provider. This information is not intended to replace advice given to you by your health care provider. Make sure you discuss any questions you have with your health care  provider. Document Revised: 09/18/2020 Document Reviewed: 09/18/2020 Elsevier Patient Education  2023 Elsevier Inc.  

## 2022-08-15 ENCOUNTER — Other Ambulatory Visit: Payer: Self-pay

## 2022-08-18 ENCOUNTER — Other Ambulatory Visit: Payer: Self-pay | Admitting: *Deleted

## 2022-08-18 MED ORDER — ENCORAFENIB 75 MG PO CAPS: 300.0000 mg | ORAL_CAPSULE | Freq: Every day | ORAL | 1 refills | Status: DC

## 2022-08-18 NOTE — Telephone Encounter (Signed)
Louis [name] left VM that [facility] needs a script for his Braftovi. Also received message from oral oncology to send refill to [facility] pharmacy.

## 2022-08-19 ENCOUNTER — Other Ambulatory Visit: Payer: Self-pay

## 2022-08-20 ENCOUNTER — Other Ambulatory Visit: Payer: Self-pay

## 2022-09-02 ENCOUNTER — Telehealth: Payer: Self-pay

## 2022-09-02 ENCOUNTER — Encounter (HOSPITAL_COMMUNITY): Payer: Self-pay | Admitting: Internal Medicine

## 2022-09-02 ENCOUNTER — Encounter: Payer: Self-pay | Admitting: Oncology

## 2022-09-02 ENCOUNTER — Emergency Department (HOSPITAL_BASED_OUTPATIENT_CLINIC_OR_DEPARTMENT_OTHER): Payer: Medicare Other

## 2022-09-02 ENCOUNTER — Other Ambulatory Visit (HOSPITAL_BASED_OUTPATIENT_CLINIC_OR_DEPARTMENT_OTHER): Payer: Self-pay

## 2022-09-02 ENCOUNTER — Inpatient Hospital Stay (HOSPITAL_BASED_OUTPATIENT_CLINIC_OR_DEPARTMENT_OTHER)
Admission: EM | Admit: 2022-09-02 | Discharge: 2022-09-04 | DRG: 308 | Disposition: A | Discharge status: Home / Self Care | Payer: Medicare Other | Attending: Family Medicine | Admitting: Family Medicine

## 2022-09-02 ENCOUNTER — Other Ambulatory Visit: Payer: Self-pay

## 2022-09-02 DIAGNOSIS — I7121 Aneurysm of the ascending aorta, without rupture: Secondary | ICD-10-CM | POA: Diagnosis present

## 2022-09-02 DIAGNOSIS — R7989 Other specified abnormal findings of blood chemistry: Secondary | ICD-10-CM | POA: Diagnosis present

## 2022-09-02 DIAGNOSIS — N289 Disorder of kidney and ureter, unspecified: Secondary | ICD-10-CM | POA: Diagnosis not present

## 2022-09-02 DIAGNOSIS — N184 Chronic kidney disease, stage 4 (severe): Secondary | ICD-10-CM | POA: Diagnosis not present

## 2022-09-02 DIAGNOSIS — E860 Dehydration: Secondary | ICD-10-CM | POA: Diagnosis present

## 2022-09-02 DIAGNOSIS — D649 Anemia, unspecified: Secondary | ICD-10-CM | POA: Diagnosis present

## 2022-09-02 DIAGNOSIS — I5033 Acute on chronic diastolic (congestive) heart failure: Secondary | ICD-10-CM | POA: Diagnosis not present

## 2022-09-02 DIAGNOSIS — C779 Secondary and unspecified malignant neoplasm of lymph node, unspecified: Secondary | ICD-10-CM | POA: Diagnosis present

## 2022-09-02 DIAGNOSIS — R000 Tachycardia, unspecified: Secondary | ICD-10-CM

## 2022-09-02 DIAGNOSIS — I4892 Unspecified atrial flutter: Secondary | ICD-10-CM | POA: Diagnosis not present

## 2022-09-02 DIAGNOSIS — I4891 Unspecified atrial fibrillation: Secondary | ICD-10-CM | POA: Diagnosis not present

## 2022-09-02 DIAGNOSIS — D6959 Other secondary thrombocytopenia: Secondary | ICD-10-CM | POA: Diagnosis present

## 2022-09-02 DIAGNOSIS — Z96652 Presence of left artificial knee joint: Secondary | ICD-10-CM | POA: Diagnosis present

## 2022-09-02 DIAGNOSIS — C786 Secondary malignant neoplasm of retroperitoneum and peritoneum: Secondary | ICD-10-CM | POA: Diagnosis not present

## 2022-09-02 DIAGNOSIS — D6481 Anemia due to antineoplastic chemotherapy: Secondary | ICD-10-CM | POA: Diagnosis present

## 2022-09-02 DIAGNOSIS — I447 Left bundle-branch block, unspecified: Secondary | ICD-10-CM | POA: Diagnosis present

## 2022-09-02 DIAGNOSIS — I340 Nonrheumatic mitral (valve) insufficiency: Secondary | ICD-10-CM | POA: Diagnosis not present

## 2022-09-02 DIAGNOSIS — C189 Malignant neoplasm of colon, unspecified: Secondary | ICD-10-CM | POA: Diagnosis not present

## 2022-09-02 DIAGNOSIS — I509 Heart failure, unspecified: Secondary | ICD-10-CM | POA: Diagnosis not present

## 2022-09-02 DIAGNOSIS — C187 Malignant neoplasm of sigmoid colon: Secondary | ICD-10-CM | POA: Diagnosis present

## 2022-09-02 DIAGNOSIS — R609 Edema, unspecified: Secondary | ICD-10-CM | POA: Diagnosis not present

## 2022-09-02 DIAGNOSIS — Z83438 Family history of other disorder of lipoprotein metabolism and other lipidemia: Secondary | ICD-10-CM

## 2022-09-02 DIAGNOSIS — I252 Old myocardial infarction: Secondary | ICD-10-CM

## 2022-09-02 DIAGNOSIS — Z79899 Other long term (current) drug therapy: Secondary | ICD-10-CM

## 2022-09-02 DIAGNOSIS — T451X5A Adverse effect of antineoplastic and immunosuppressive drugs, initial encounter: Secondary | ICD-10-CM | POA: Diagnosis present

## 2022-09-02 DIAGNOSIS — C785 Secondary malignant neoplasm of large intestine and rectum: Secondary | ICD-10-CM | POA: Diagnosis present

## 2022-09-02 DIAGNOSIS — R600 Localized edema: Secondary | ICD-10-CM | POA: Diagnosis present

## 2022-09-02 DIAGNOSIS — I2489 Other forms of acute ischemic heart disease: Secondary | ICD-10-CM | POA: Diagnosis present

## 2022-09-02 DIAGNOSIS — R0602 Shortness of breath: Secondary | ICD-10-CM

## 2022-09-02 DIAGNOSIS — Z66 Do not resuscitate: Secondary | ICD-10-CM | POA: Diagnosis present

## 2022-09-02 DIAGNOSIS — G620 Drug-induced polyneuropathy: Secondary | ICD-10-CM | POA: Diagnosis not present

## 2022-09-02 DIAGNOSIS — Z7901 Long term (current) use of anticoagulants: Secondary | ICD-10-CM

## 2022-09-02 DIAGNOSIS — E785 Hyperlipidemia, unspecified: Secondary | ICD-10-CM | POA: Diagnosis present

## 2022-09-02 DIAGNOSIS — I739 Peripheral vascular disease, unspecified: Secondary | ICD-10-CM | POA: Diagnosis not present

## 2022-09-02 DIAGNOSIS — I130 Hypertensive heart and chronic kidney disease with heart failure and stage 1 through stage 4 chronic kidney disease, or unspecified chronic kidney disease: Secondary | ICD-10-CM | POA: Diagnosis not present

## 2022-09-02 DIAGNOSIS — Z85038 Personal history of other malignant neoplasm of large intestine: Secondary | ICD-10-CM | POA: Diagnosis not present

## 2022-09-02 DIAGNOSIS — E872 Acidosis, unspecified: Secondary | ICD-10-CM | POA: Diagnosis present

## 2022-09-02 DIAGNOSIS — I10 Essential (primary) hypertension: Secondary | ICD-10-CM | POA: Diagnosis present

## 2022-09-02 DIAGNOSIS — R778 Other specified abnormalities of plasma proteins: Secondary | ICD-10-CM | POA: Diagnosis not present

## 2022-09-02 LAB — HEPATIC FUNCTION PANEL
ALT: 5 U/L (ref 0–44)
AST: 7 U/L — ABNORMAL LOW (ref 15–41)
Albumin: 3.5 g/dL (ref 3.5–5.0)
Alkaline Phosphatase: 106 U/L (ref 38–126)
Bilirubin, Direct: 0.1 mg/dL (ref 0.0–0.2)
Indirect Bilirubin: 0.3 mg/dL (ref 0.3–0.9)
Total Bilirubin: 0.4 mg/dL (ref 0.3–1.2)
Total Protein: 6.5 g/dL (ref 6.5–8.1)

## 2022-09-02 LAB — COMPREHENSIVE METABOLIC PANEL
ALT: 9 U/L (ref 0–44)
AST: 9 U/L — ABNORMAL LOW (ref 15–41)
Albumin: 2.7 g/dL — ABNORMAL LOW (ref 3.5–5.0)
Alkaline Phosphatase: 93 U/L (ref 38–126)
Anion gap: 7 (ref 5–15)
BUN: 33 mg/dL — ABNORMAL HIGH (ref 8–23)
CO2: 17 mmol/L — ABNORMAL LOW (ref 22–32)
Calcium: 8.4 mg/dL — ABNORMAL LOW (ref 8.9–10.3)
Chloride: 112 mmol/L — ABNORMAL HIGH (ref 98–111)
Creatinine, Ser: 2.42 mg/dL — ABNORMAL HIGH (ref 0.61–1.24)
GFR, Estimated: 27 mL/min — ABNORMAL LOW (ref >60)
Glucose, Bld: 130 mg/dL — ABNORMAL HIGH (ref 70–99)
Potassium: 4.4 mmol/L (ref 3.5–5.1)
Sodium: 136 mmol/L (ref 135–145)
Total Bilirubin: 0.6 mg/dL (ref 0.3–1.2)
Total Protein: 6 g/dL — ABNORMAL LOW (ref 6.5–8.1)

## 2022-09-02 LAB — TROPONIN I (HIGH SENSITIVITY)
Troponin I (High Sensitivity): 150 ng/L (ref <18)
Troponin I (High Sensitivity): 169 ng/L (ref <18)
Troponin I (High Sensitivity): 165 ng/L (ref <18)

## 2022-09-02 LAB — CBC
HCT: 32.1 % — ABNORMAL LOW (ref 39.0–52.0)
Hemoglobin: 9.9 g/dL — ABNORMAL LOW (ref 13.0–17.0)
MCH: 27 pg (ref 26.0–34.0)
MCHC: 30.8 g/dL (ref 30.0–36.0)
MCV: 87.5 fL (ref 80.0–100.0)
Platelets: 206 10*3/uL (ref 150–400)
RBC: 3.67 MIL/uL — ABNORMAL LOW (ref 4.22–5.81)
RDW: 17 % — ABNORMAL HIGH (ref 11.5–15.5)
WBC: 8.1 10*3/uL (ref 4.0–10.5)
nRBC: 0 % (ref 0.0–0.2)

## 2022-09-02 LAB — BASIC METABOLIC PANEL
Anion gap: 10 (ref 5–15)
BUN: 38 mg/dL — ABNORMAL HIGH (ref 8–23)
CO2: 15 mmol/L — ABNORMAL LOW (ref 22–32)
Calcium: 8.8 mg/dL — ABNORMAL LOW (ref 8.9–10.3)
Chloride: 111 mmol/L (ref 98–111)
Creatinine, Ser: 2.27 mg/dL — ABNORMAL HIGH (ref 0.61–1.24)
GFR, Estimated: 29 mL/min — ABNORMAL LOW (ref >60)
Glucose, Bld: 97 mg/dL (ref 70–99)
Potassium: 4.9 mmol/L (ref 3.5–5.1)
Sodium: 136 mmol/L (ref 135–145)

## 2022-09-02 LAB — T4, FREE: Free T4: 0.8 ng/dL (ref 0.61–1.12)

## 2022-09-02 LAB — BLOOD GAS, VENOUS
Acid-base deficit: 7.6 mmol/L — ABNORMAL HIGH (ref 0.0–2.0)
Bicarbonate: 16.2 mmol/L — ABNORMAL LOW (ref 20.0–28.0)
O2 Saturation: 90.1 %
Patient temperature: 37
pCO2, Ven: 28 mmHg — ABNORMAL LOW (ref 44–60)
pH, Ven: 7.37 (ref 7.25–7.43)
pO2, Ven: 60 mmHg — ABNORMAL HIGH (ref 32–45)

## 2022-09-02 LAB — IRON AND TIBC
Iron: 22 ug/dL — ABNORMAL LOW (ref 45–182)
Saturation Ratios: 12 % — ABNORMAL LOW (ref 17.9–39.5)
TIBC: 188 ug/dL — ABNORMAL LOW (ref 250–450)
UIBC: 166 ug/dL

## 2022-09-02 LAB — CK: Total CK: 31 U/L — ABNORMAL LOW (ref 49–397)

## 2022-09-02 LAB — PREALBUMIN: Prealbumin: 15 mg/dL — ABNORMAL LOW (ref 18–38)

## 2022-09-02 LAB — RETICULOCYTES
Immature Retic Fract: 22.5 % — ABNORMAL HIGH (ref 2.3–15.9)
RBC.: 3.45 MIL/uL — ABNORMAL LOW (ref 4.22–5.81)
Retic Count, Absolute: 63.5 10*3/uL (ref 19.0–186.0)
Retic Ct Pct: 1.8 % (ref 0.4–3.1)

## 2022-09-02 LAB — D-DIMER, QUANTITATIVE: D-Dimer, Quant: 0.62 ug/mL-FEU — ABNORMAL HIGH (ref 0.00–0.50)

## 2022-09-02 LAB — MAGNESIUM: Magnesium: 1.3 mg/dL — ABNORMAL LOW (ref 1.7–2.4)

## 2022-09-02 LAB — BRAIN NATRIURETIC PEPTIDE: B Natriuretic Peptide: 804.9 pg/mL — ABNORMAL HIGH (ref 0.0–100.0)

## 2022-09-02 LAB — TSH: TSH: 2.009 u[IU]/mL (ref 0.350–4.500)

## 2022-09-02 LAB — FOLATE: Folate: 7.9 ng/mL (ref >5.9)

## 2022-09-02 LAB — FERRITIN: Ferritin: 51 ng/mL (ref 24–336)

## 2022-09-02 LAB — PHOSPHORUS: Phosphorus: 2.8 mg/dL (ref 2.5–4.6)

## 2022-09-02 MED ORDER — LACTATED RINGERS IV BOLUS
1000.0000 mL | Freq: Once | INTRAVENOUS | Status: AC
  Administered 2022-09-02: 1000 mL via INTRAVENOUS

## 2022-09-02 MED ORDER — HEPARIN BOLUS VIA INFUSION
6000.0000 [IU] | Freq: Once | INTRAVENOUS | Status: AC
  Administered 2022-09-02: 6000 [IU] via INTRAVENOUS

## 2022-09-02 MED ORDER — ACETAMINOPHEN 325 MG PO TABS: 650.0000 mg | ORAL_TABLET | Freq: Four times a day (QID) | ORAL | Status: DC | PRN

## 2022-09-02 MED ORDER — ONDANSETRON HCL 4 MG/2ML IJ SOLN: 4.0000 mg | Freq: Four times a day (QID) | INTRAMUSCULAR | Status: DC | PRN

## 2022-09-02 MED ORDER — SODIUM CHLORIDE 0.9 % IV SOLN: INTRAVENOUS | Status: AC

## 2022-09-02 MED ORDER — HEPARIN (PORCINE) 25000 UT/250ML-% IV SOLN
1900.0000 [IU]/h | INTRAVENOUS | Status: DC
  Administered 2022-09-02: 1600 [IU]/h via INTRAVENOUS
  Administered 2022-09-03: 1900 [IU]/h via INTRAVENOUS
  Filled 2022-09-02 (×2): qty 250

## 2022-09-02 MED ORDER — DILTIAZEM HCL-DEXTROSE 125-5 MG/125ML-% IV SOLN (PREMIX)
5.0000 mg/h | INTRAVENOUS | Status: DC
  Administered 2022-09-02: 5 mg/h via INTRAVENOUS
  Administered 2022-09-03 – 2022-09-04 (×4): 15 mg/h via INTRAVENOUS
  Filled 2022-09-02 (×5): qty 125

## 2022-09-02 MED ORDER — ACETAMINOPHEN 650 MG RE SUPP: 650.0000 mg | Freq: Four times a day (QID) | RECTAL | Status: DC | PRN

## 2022-09-02 MED ORDER — HYDROCODONE-ACETAMINOPHEN 5-325 MG PO TABS: 1.0000 | ORAL_TABLET | ORAL | Status: DC | PRN

## 2022-09-02 MED ORDER — ONDANSETRON HCL 4 MG PO TABS: 4.0000 mg | ORAL_TABLET | Freq: Four times a day (QID) | ORAL | Status: DC | PRN

## 2022-09-02 MED ORDER — ASPIRIN 325 MG PO TABS
325.0000 mg | ORAL_TABLET | Freq: Every day | ORAL | Status: DC
  Administered 2022-09-02: 325 mg via ORAL
  Filled 2022-09-02: qty 1

## 2022-09-02 MED ORDER — MAGNESIUM SULFATE 4 GM/100ML IV SOLN
4.0000 g | Freq: Once | INTRAVENOUS | Status: AC
  Administered 2022-09-03: 4 g via INTRAVENOUS
  Filled 2022-09-02: qty 100

## 2022-09-02 MED ORDER — DILTIAZEM HCL 25 MG/5ML IV SOLN
10.0000 mg | Freq: Once | INTRAVENOUS | Status: AC
  Administered 2022-09-02: 10 mg via INTRAVENOUS
  Filled 2022-09-02: qty 5

## 2022-09-02 NOTE — Telephone Encounter (Signed)
Mr. Dispenza notified us that he is experiencing atrial fibrillation. Despite attempting to contact the patient, there was no response. It appears that the patient sought treatment in the emergency department for tachycardia. I have informed the healthcare provider of the patient's current status.

## 2022-09-02 NOTE — Assessment & Plan Note (Signed)
-  chronic avoid nephrotoxic medications such as NSAIDs, Vanco Zosyn combo,  avoid hypotension, continue to follow renal function  

## 2022-09-02 NOTE — ED Notes (Signed)
Kim with cl called for ed to ed transport

## 2022-09-02 NOTE — ED Notes (Signed)
Report called to [facility] charge [employer]

## 2022-09-02 NOTE — Assessment & Plan Note (Signed)
Will email oncology that pt is being admitted

## 2022-09-02 NOTE — ED Provider Notes (Signed)
[place] AT [facility] Provider Note   CSN: [id number] Arrival date & time: 09/02/22  1131     History  Chief Complaint  Patient presents with   Shortness of Breath    Louis [name] is a 77 y.o. male.     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  The patient states that he has been fatigued for the past few days.  He took his blood pressure yesterday at home and noted that it was 90/50.  His daughter is a Engineer, civil (consulting) and told him to increase his oral intake of water.  He was tachycardic at home to the 130s.  He denied any chest pain at that time.  He had some mild shortness of breath but he states that he thought this was more chronic rather than acute.  No fevers or chills.  No cough.  His blood pressure improved after oral rehydration however he remained persistently tachycardic to the 130s and so he presented to the emergency department today for further evaluation.  On arrival, he denies any chest pain.  He endorses mild shortness of breath, no cough, no fevers.  He denies any abdominal pain.  He denies any urinary symptoms.  No nausea or vomiting.  Home Medications Prior to Admission medications   Medication Sig Start Date End Date Taking? Authorizing Provider  acetaminophen (TYLENOL) 325 MG tablet Tylenol   Yes [provider]  amLODipine (NORVASC) 10 MG tablet Take 10 mg by mouth daily. 02/11/16  Yes [provider]  doxycycline (VIBRA-TABS) 100 MG tablet TAKE 1 TABLET(100 MG) BY MOUTH TWICE DAILY 08/04/22  Yes Ladene Artist, MD  encorafenib (BRAFTOVI) 75 MG capsule Take 4 capsules (300 mg total) by mouth daily. 08/18/22  Yes Ladene Artist, MD  ferrous sulfate 325 (65 FE) MG  EC tablet Take 1 tablet (325 mg total) by mouth 2 (two) times daily with a meal. 07/23/22  Yes Rana Snare, NP  labetalol (NORMODYNE) 200 MG tablet Take 200 mg by mouth 3 (three) times daily.  04/23/16  Yes [provider]  lidocaine-prilocaine (EMLA) cream Apply 1 Application topically as needed. Apply 1/2 tablespoon to port site 2 hours prior to stick and cover with plastic wrap to numb site. DO NOT USE UNTIL 14 DAYS AFTER PORT IS PLACED. 03/19/22  Yes Ladene Artist, MD  lisinopril (ZESTRIL) 5 MG tablet Take 1 tablet by mouth daily.   Yes [provider]  loratadine (CLARITIN) 10 MG tablet Take 10 mg by mouth daily as needed for allergies.    Yes [provider]  losartan (COZAAR) 25 MG tablet Take 25 mg by mouth daily. 04/18/20  Yes [provider]  Magnesium Oxide 400 MG CAPS Take 1 capsule (400 mg total) by mouth 2 (two) times daily. 07/23/22  Yes Rana Snare, NP  ondansetron (ZOFRAN) 8 MG tablet Take 1 tablet (8 mg total) by mouth every 8 (eight) hours as needed for nausea or vomiting. 12/07/20  Yes Ladene Artist, MD  prochlorperazine (COMPAZINE) 10 MG tablet Take 10 mg by mouth every 6 (six) hours as needed for nausea or vomiting.   Yes [provider]      Allergies    Patient has no  known allergies.    Review of Systems   Review of Systems  Respiratory:  Positive for shortness of breath.   All other systems reviewed and are negative.   Physical Exam Updated Vital Signs BP (!) 150/102   Pulse (!) 134   Temp 98 F (36.7 C) (Oral)   Resp 20   Ht 6\' 4"  (1.93 m)   Wt 99.8 kg   SpO2 94%   BMI 26.78 kg/m  Physical Exam Vitals and nursing note reviewed.  Constitutional:      General: He is not in acute distress.    Appearance: He is well-developed.  HENT:     Head: Normocephalic and atraumatic.  Eyes:     Conjunctiva/sclera: Conjunctivae normal.  Cardiovascular:     Rate and Rhythm: Regular rhythm. Tachycardia present.      Pulses: Normal pulses.  Pulmonary:     Effort: Pulmonary effort is normal. No respiratory distress.     Breath sounds: Normal breath sounds.  Abdominal:     Palpations: Abdomen is soft.     Tenderness: There is no abdominal tenderness.  Musculoskeletal:        General: No swelling.     Cervical back: Neck supple.  Skin:    General: Skin is warm and dry.     Capillary Refill: Capillary refill takes less than 2 seconds.  Neurological:     Mental Status: He is alert.  Psychiatric:        Mood and Affect: Mood normal.     ED Results / Procedures / Treatments   Labs (all labs ordered are listed, but only abnormal results are displayed) Labs Reviewed  BASIC METABOLIC PANEL - Abnormal; Notable for the following components:      Result Value   CO2 15 (*)    BUN 38 (*)    Creatinine, Ser 2.27 (*)    Calcium 8.8 (*)    GFR, Estimated 29 (*)    All other components within normal limits  CBC - Abnormal; Notable for the following components:   RBC 3.67 (*)    Hemoglobin 9.9 (*)    HCT 32.1 (*)    RDW 17.0 (*)    All other components within normal limits  HEPATIC FUNCTION PANEL - Abnormal; Notable for the following components:   AST 7 (*)    All other components within normal limits  BRAIN NATRIURETIC PEPTIDE - Abnormal; Notable for the following components:   B Natriuretic Peptide 804.9 (*)    All other components within normal limits  TROPONIN I (HIGH SENSITIVITY) - Abnormal; Notable for the following components:   Troponin I (High Sensitivity) 150 (*)    All other components within normal limits  TROPONIN I (HIGH SENSITIVITY) - Abnormal; Notable for the following components:   Troponin I (High Sensitivity) 169 (*)    All other components within normal limits  TSH  T4, FREE  HEPARIN LEVEL (UNFRACTIONATED)    EKG None  Radiology DG Chest Portable 1 View  Result Date: 09/02/2022 CLINICAL DATA:  Shortness of breath EXAM: PORTABLE CHEST 1 VIEW COMPARISON:  None  Available. FINDINGS: No pleural effusion. No pneumothorax. No focal airspace opacity. Cardiomegaly. No radiographically apparent displaced rib fractures. Visualized upper abdomen is unremarkable. IMPRESSION: Cardiomegaly.  No pleural effusion or significant pulmonary edema. Electronically Signed   By: Lorenza Cambridge M.D.   On: 09/02/2022 12:16    Procedures Procedures    Medications Ordered in ED Medications  heparin ADULT infusion 100 units/mL (  25000 units/247mL) (1,600 Units/hr Intravenous New Bag/Given 09/02/22 1442)  aspirin tablet 325 mg (has no administration in time range)  lactated ringers bolus 1,000 mL (0 mLs Intravenous Stopped 09/02/22 1349)  heparin bolus via infusion 6,000 Units (6,000 Units Intravenous Bolus from Bag 09/02/22 1443)  lactated ringers bolus 1,000 mL (1,000 mLs Intravenous New Bag/Given 09/02/22 1541)    ED Course/ Medical Decision Making/ A&P Clinical Course as of 09/02/22 1608  Tue Sep 02, 2022  1417 Troponin I (High Sensitivity)(!!): 150 [JL]  1417 B Natriuretic Peptide(!): 804.9 [JL]    Clinical Course User Index [JL] Ernie Avena, MD                             Medical Decision Making Amount and/or Complexity of Data Reviewed Labs: ordered. Decision-making details documented in ED Course. Radiology: ordered.  Risk OTC drugs. Prescription drug management.    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  The patient states that he has been fatigued for the past few days.  He took his blood pressure yesterday at home and noted that it was 90/50.  His daughter is a Engineer, civil (consulting) and told him to increase his oral intake of water.  He was tachycardic at home to the 130s.  He denied any chest pain at that time.  He had some  mild shortness of breath but he states that he thought this was more chronic rather than acute.  No fevers or chills.  No cough.  His blood pressure improved after oral rehydration however he remained persistently tachycardic to the 130s and so he presented to the emergency department today for further evaluation.  On arrival, he denies any chest pain.  He endorses mild shortness of breath, no cough, no fevers.  He denies any abdominal pain.  He denies any urinary symptoms.  No nausea or vomiting.  On arrival, the patient was afebrile, temperature 98, tachycardic heart rate 138, not tachypneic RR 18, BP 151/97, saturating 100% on room air.  Sinus tachycardia versus atrial flutter with 2-1 conduction noted on cardiac telemetry.  Initial EKGs confirm this.  I discussed this finding with on-call cardiology, Dr. Antoine Poche who felt that it was not clearly atrial flutter.  Given the patient's recent hypotension, also considered on the differential PE given his mild shortness of breath, known colon cancer and tachycardia.  He is not on anticoagulation.  He has no known history of atrial flutter or atrial fibrillation.He continues to deny any chest pain.  Initial workup significant for chest x-ray which revealed cardiomegaly, no pleural effusion or significant pulmonary edema.  The patient has history of diastolic dysfunction on a recent echo in 2019.  Normal EF of 55%.  Evaluation significant for BNP elevated to 805, hepatic function panel normal, TSH normal, CBC without a leukocytosis, stable anemia to 9.9 from the patient's baseline, BMP without abnormal potassium or sodium, non-anion gap acidosis noted with a bicarbonate of 15, stable CKD at 2.27 but with a GFR of 29.  Initial troponin was elevated at 150, repeat slightly uptrending to 169.  The patient continues to deny chest discomfort.  With concern for PE, the patient was administered a heparin bolus and started on a heparin gtt.  He was administered IV  fluid  bolus as well.  Discussed the care of the patient with Dr. Alinda Money, on-call hospitalist who recommended initial ER-ER transfer for VQ scan as the patient cannot undergo CTA imaging given his CKD.  Discussed the care of the patient with Dr. Doran Durand who accepted the patient in ER to ER transfer.  Plan for admission, reengagement with cardiology pending results of VQ scan.  If positive for PE, patient has evidence of right heart strain and engagement with critical care could be indicated.  [facility] contacted for transfer and the patient and family were updated bedside.   Final Clinical Impression(s) / ED Diagnoses Final diagnoses:  Tachycardia  SOB (shortness of breath)  Elevated troponin  Elevated brain natriuretic peptide (BNP) level    Rx / DC Orders ED Discharge Orders     None         Ernie Avena, MD 09/02/22 1611

## 2022-09-02 NOTE — ED Notes (Signed)
[facility] at bedside to transport pt to [facility] 

## 2022-09-02 NOTE — Assessment & Plan Note (Signed)
Could be in the setting of CKD  May need to start bicarbonate  Will check lactic acid

## 2022-09-02 NOTE — Assessment & Plan Note (Signed)
Obtain dopplers

## 2022-09-02 NOTE — Subjective & Objective (Signed)
Pt with hx of stage IV colon cancer no prior history of A-fib has been having fatigue for the past few days he has not been eating and drinking well.  Noted to be hypotensive and 90s over 50s and tachycardic up to 130s at home some associated shortness of breath no chest pain shortness of breath is somewhat chronic.  No fevers no chills no cough patient got rehydrated and blood pressure improved he did present to [facility] where he was found to be in A-fib with RVR Plan was for patient to be transferred to [facility] for VQ scan by time patient arrived VQ scan facility is closed.  Patient on heparin Admitted for further workup

## 2022-09-02 NOTE — Consult Note (Signed)
Cardiology Consultation   Patient ID: Louis [name] MRN: [id number]; DOB: 1945/10/10  Admit date: 09/02/2022 Date of Consult: 09/02/2022  PCP:  [name], MD   [facility] Providers Cardiologist:  Garwin Brothers, MD   {   Patient Profile:   Louis [name] is a 77 y.o. male with a hx of HTN, colon cancer, CKD IIIB, LBBB, and ascending aortic aneurysm who is being seen 09/02/2022 for the evaluation of elevated troponin and SOB at the request of Dr. Silverio Lay.  History of Present Illness:   [name] with history as detailed above who follows with Dr. Tomie China as outpatient. Mypview 08/2019 with LVEF EF 53%, apical fixed defect, overall low risk. TTE 01/2018 with LVEF 55%, abnormal septal motion in the setting of LBBB, G1DD, moderately dilated aortic root at 45mm. Last seen by Dr. Tomie China in 2021 for pre-operative risk assessment. No cardiac complaints at that time.  Patient presented on this admission with worsening SOB and elevated HR. ECG on arrival with Aflutter with RVR with HR 130s. Blood pressure soft in 90/50s. Labs notable for trop 150>169, BNP 804, Cr 2.27. CXR with cardiomegaly but no pleural effusion. Cardiology is now consulted for elevated troponin and Aflutter with RVR.  Currently, the patient states that he has been feeling unwell with worsening fatigue and DOE for the past several weeks. He did not monitor his HR or BP at home so he is unsure when his rapid rates started to develop. Today, he felt very unwell and with significant weakness/malaise. He took his BP and was noted to by hypotensive with BP 90/50s and HR 130s prompting him to come to the ER for further evaluation as detailed above.   Past Medical History:  Diagnosis Date   Allergy    seasonal allergies   Anemia    Arthritis 2010   Ascending aorta dilatation (HCC)    4.1 cm noted on CT1/15/21   Blood transfusion without reported diagnosis    CKD (chronic kidney disease) stage 2, GFR  60-89 ml/min    see [place], Stage 1   colon ca dx'd 04/2016   colon   Hernia, abdominal    sx fixed   Hyperlipidemia    on meds   Hypertension    on meds   LBBB (left bundle branch block) 08/18/2019   Neuropathy    Pneumonia 05/19/2018   Seasonal allergies    Thrombocytopenia (HCC)    during chemo.    Past Surgical History:  Procedure Laterality Date   BIOPSY  04/10/2016   Procedure: BIOPSY Mesentary;  Surgeon: Jimmye Norman, MD;  Location: [facility] OR;  Service: General;;   COLONOSCOPY  2019   TA/tubulovillous adenoma   COLOSTOMY  2018   reversed   COLOSTOMY REVERSAL N/A 01/12/2017   Procedure: COLOSTOMY REVERSAL;  Surgeon: Jimmye Norman, MD;  Location: [facility] OR;  Service: General;  Laterality: N/A;   FLEXIBLE SIGMOIDOSCOPY N/A 04/09/2016   Procedure: FLEXIBLE SIGMOIDOSCOPY;  Surgeon: Meryl Dare, MD;  Location: [facility] ENDOSCOPY;  Service: Endoscopy;  Laterality: N/A;   HERNIA REPAIR     in childhood   INGUINAL HERNIA REPAIR Right 03/29/2018   Procedure: OPEN REPAIR OF RIGHT INGUINAL HERNIA WITH MESH;  Surgeon: Jimmye Norman, MD;  Location: [facility] OR;  Service: General;  Laterality: Right;   INSERTION OF MESH N/A 03/29/2018   Procedure: INSERTION OF MESH;  Surgeon: Jimmye Norman, MD;  Location: [facility] OR;  Service: General;  Laterality: N/A;   IR GENERIC  HISTORICAL  05/14/2016   IR US GUIDE VASC ACCESS RIGHT 05/14/2016 Gilmer Mor, DO [facility]INTER[facility] RAD   IR GENERIC HISTORICAL  05/14/2016   IR FLUORO GUIDE PORT INSERTION RIGHT 05/14/2016 Gilmer Mor, DO [facility]   IR IMAGING GUIDED PORT INSERTION  12/17/2020   IR REMOVAL TUN ACCESS W/ PORT W/O FL MOD SED  11/11/2016   PARTIAL COLECTOMY N/A 04/10/2016   Procedure: SIGMOID  COLECTOMY WITH COLOSTOMY;  Surgeon: Jimmye Norman, MD;  Location[facility] OR;  Service: General;  Laterality: N/A;   TONSILLECTOMY     TOTAL KNEE ARTHROPLASTY Left 09/29/2019   Procedure: TOTAL KNEE ARTHROPLASTY;  Surgeon: Durene Romans, MD;  Location: [facility] ORS;  Service: Orthopedics;   Laterality: Left;  70 mins     Home Medications:  Prior to Admission medications   Medication Sig Start Date End Date Taking? Authorizing Provider  acetaminophen (TYLENOL) 325 MG tablet Tylenol   Yes [provider]  amLODipine (NORVASC) 10 MG tablet Take 10 mg by mouth daily. 02/11/16  Yes [provider]  doxycycline (VIBRA-TABS) 100 MG tablet TAKE 1 TABLET(100 MG) BY MOUTH TWICE DAILY 08/04/22  Yes Ladene Artist, MD  encorafenib (BRAFTOVI) 75 MG capsule Take 4 capsules (300 mg total) by mouth daily. 08/18/22  Yes Ladene Artist, MD  ferrous sulfate 325 (65 FE) MG EC tablet Take 1 tablet (325 mg total) by mouth 2 (two) times daily with a meal. 07/23/22  Yes Rana Snare, NP  labetalol (NORMODYNE) 200 MG tablet Take 200 mg by mouth 3 (three) times daily.  04/23/16  Yes [provider]  lidocaine-prilocaine (EMLA) cream Apply 1 Application topically as needed. Apply 1/2 tablespoon to port site 2 hours prior to stick and cover with plastic wrap to numb site. DO NOT USE UNTIL 14 DAYS AFTER PORT IS PLACED. 03/19/22  Yes Ladene Artist, MD  lisinopril (ZESTRIL) 5 MG tablet Take 1 tablet by mouth daily.   Yes [provider]  loratadine (CLARITIN) 10 MG tablet Take 10 mg by mouth daily as needed for allergies.    Yes [provider]  losartan (COZAAR) 25 MG tablet Take 25 mg by mouth daily. 04/18/20  Yes [provider]  Magnesium Oxide 400 MG CAPS Take 1 capsule (400 mg total) by mouth 2 (two) times daily. 07/23/22  Yes Rana Snare, NP  ondansetron (ZOFRAN) 8 MG tablet Take 1 tablet (8 mg total) by mouth every 8 (eight) hours as needed for nausea or vomiting. 12/07/20  Yes Ladene Artist, MD  prochlorperazine (COMPAZINE) 10 MG tablet Take 10 mg by mouth every 6 (six) hours as needed for nausea or vomiting.   Yes [provider]    Inpatient Medications: Scheduled Meds:  aspirin  325 mg Oral Daily   Continuous Infusions:   sodium chloride     diltiazem (CARDIZEM) infusion 10 mg/hr (09/02/22 1953)   heparin 1,600 Units/hr (09/02/22 1442)   PRN Meds:   Allergies:   No Known Allergies  Social History:   Social History   Socioeconomic History   Marital status: Married    Spouse name: Not on file   Number of children: Not on file   Years of education: Not on file   Highest education level: Not on file  Occupational History   Not on file  Tobacco Use   Smoking status: Never    Passive exposure: Never   Smokeless tobacco: Never  Vaping Use   Vaping Use: Never used  Substance and Sexual Activity   Alcohol use: Yes    Alcohol/week: 3.0 - 4.0 standard drinks of alcohol    Types: 3 - 4 Glasses of wine per week    Comment: 3-4 glasses of wine per week   Drug use: No   Sexual activity: Not on file  Other Topics Concern   Not on file  Social History Narrative   Married, wife Thurston Hole   Has #2 daughters and #2 brothers   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   Social Determinants of Corporate investment banker Strain: Not on file  Food Insecurity: No Food Insecurity (09/02/2022)   Hunger Vital Sign    Worried About Running Out of Food in the Last Year: Never true    Ran Out of Food in the Last Year: Never true  Transportation Needs: No Transportation Needs (09/02/2022)   PRAPARE - Administrator, Civil Service (Medical): No    Lack of Transportation (Non-Medical): No  Physical Activity: Not on file  Stress: Not on file  Social Connections: Not on file  Intimate Partner Violence: Not At Risk (09/02/2022)   Humiliation, Afraid, Rape, and Kick questionnaire    Fear of Current or Ex-Partner: No    Emotionally Abused: No    Physically Abused: No    Sexually Abused: No    Family History:   Family History  Problem Relation Age of Onset   Hyperlipidemia Father    Diverticulitis Brother    Colon cancer Neg Hx    Esophageal cancer Neg Hx    Rectal cancer Neg Hx    Stomach  cancer Neg Hx    Colon polyps Neg Hx    Crohn's disease Neg Hx      ROS:  Please see the history of present illness.  All other ROS reviewed and negative.     Physical Exam/Data:   Vitals:   09/02/22 1800 09/02/22 1830 09/02/22 1900 09/02/22 1930  BP: (!) 144/100 (!) 146/110 (!) 152/112 (!) 163/107  Pulse: (!) 134 (!) 136 (!) 136 (!) 135  Resp:  (!) 28 (!) 24 (!) 28  Temp:      TempSrc:      SpO2: 98% 94% 94% 94%  Weight:      Height:        Intake/Output Summary (Last 24 hours) at 09/02/2022 2037 Last data filed at 09/02/2022 1349 Gross per 24 hour  Intake 1000 ml  Output --  Net 1000 ml      09/02/2022   11:42 AM 08/12/2022   10:38 AM 07/23/2022    9:36 AM  Last 3 Weights  Weight (lbs) 220 lb 220 lb 9.6 oz 221 lb  Weight (kg) 99.791 kg 100.064 kg 100.245 kg     Body mass index is 26.78 kg/m.  General:  Well nourished, well developed, in no acute distress HEENT: normal Neck: no JVD Vascular: No carotid bruits; Distal pulses 2+ bilaterally Cardiac:  Tachycardic, regular, no murmurs Lungs:  clear to auscultation bilaterally, no wheezing, rhonchi or rales  Abd: soft, nontender, no hepatomegaly  Ext: RLE with 1+ LE edema. Left with trace LE edema Musculoskeletal:  No deformities, BUE and BLE strength normal and equal Skin: warm and dry  Neuro:  CNs 2-12 intact, no focal abnormalities noted Psych:  Normal affect   EKG:  The EKG was personally reviewed and demonstrates:  Aflutter with RVR, LBBB Telemetry:  Telemetry was personally reviewed and demonstrates:  Aflutter with RVR  Relevant CV Studies: Cardiac Studies & Procedures     STRESS TESTS  MYOCARDIAL PERFUSION IMAGING 08/29/2019  Narrative  The left ventricular ejection fraction is mildly decreased (45-54%).  Nuclear stress EF: 53%. Contractile performance compatible with left bundle branch block  There was no ST segment deviation noted during stress.  Defect 1: There is a small defect of mild severity  present in the apex location. Possible artifact  This is a low risk study. There are no areas of significant ischemia identified.  Overall, no significant change from prior nuclear stress test in 2019. Donato Schultz, MD   ECHOCARDIOGRAM  ECHOCARDIOGRAM COMPLETE 01/19/2018  Narrative *[facility]* [address] [place], [place] [address] [phone]  ------------------------------------------------------------------- Transthoracic Echocardiography  Patient:    Buddy, Loeffelholz MR #:       [id number] Study Date: 01/19/2018 Gender:     M Age:        47 Height:     193 cm Weight:     112 kg BSA:        2.47 m^2 Pt. Status: Room:  Lucia Bitter [id number] ATTENDING    Belva Crome, MD ORDERING     Belva Crome, MD REFERRING    Belva Crome, MD SONOGRAPHER  Clearence Ped, RCS PERFORMING   [facility], Outpatient  cc:  ------------------------------------------------------------------- LV EF: 55%  ------------------------------------------------------------------- Indications:      New onset LBBB (I44.7).  ------------------------------------------------------------------- History:   PMH:  HLD, CKD.  Murmur.  Risk factors:  Hypertension.  ------------------------------------------------------------------- Study Conclusions  - Left ventricle: The cavity size was normal. Wall thickness was increased in a pattern of moderate LVH. The estimated ejection fraction was 55%. Septal-lateral dyssynchrony consistent with LBBB. Doppler parameters are consistent with abnormal left ventricular relaxation (grade 1 diastolic dysfunction). - Aortic valve: There was no stenosis. - Aorta: Dilated aortic root and ascending aorta. Ascending aorta dimension: 45 mm. Aortic root dimension: 41 mm (ED). - Mitral valve: There was no significant regurgitation. - Right ventricle: The cavity size was normal. Systolic function was normal. - Right atrium: The atrium was  mildly dilated. - Tricuspid valve: Peak RV-RA gradient (S): 18 mm Hg. - Pulmonary arteries: PA peak pressure: 21 mm Hg (S). - Inferior vena cava: The vessel was normal in size. The respirophasic diameter changes were in the normal range (= 50%), consistent with normal central venous pressure.  Impressions:  - Normal LV size with moderate LV hypertrophy. EF 55% with septal-lateral dyssynchrony consistent with LBBB. Normal RV size and systolic function. No significant valvular abnormalities.  ------------------------------------------------------------------- Study data:  No prior study was available for comparison.  Study status:  Routine.  Procedure:  The patient reported no pain pre or post test. Transthoracic echocardiography. Image quality was adequate.          Transthoracic echocardiography.  M-mode, complete 2D, spectral Doppler, and color Doppler.  Birthdate: Patient birthdate: 1945/12/18.  Age:  Patient is 77 yr old.  Sex: Gender: male.    BMI: 30.1 kg/m^2.  Blood pressure:     169/88 Patient status:  Outpatient.  Study date:  Study date: 01/19/2018. Study time: 01:30 PM.  Location:  [facility]  -------------------------------------------------------------------  ------------------------------------------------------------------- Left ventricle:  The cavity size was normal. Wall thickness was increased in a pattern of moderate LVH. The estimated ejection fraction was 55%. Septal-lateral dyssynchrony consistent with LBBB. Doppler parameters are consistent with abnormal left ventricular relaxation (grade 1 diastolic dysfunction).  ------------------------------------------------------------------- Aortic valve:  Trileaflet; mildly calcified leaflets.  Doppler: There was no stenosis.   There was no regurgitation.  ------------------------------------------------------------------- Aorta:  Dilated aortic root and ascending aorta. Ascending aorta dimension: 45  mm.  ------------------------------------------------------------------- Mitral valve:   Normal thickness leaflets .  Doppler:   There was no evidence for stenosis.   There was no significant regurgitation. Peak gradient (D): 2 mm Hg.  ------------------------------------------------------------------- Left atrium:  The atrium was normal in size.  ------------------------------------------------------------------- Right ventricle:  The cavity size was normal. Systolic function was normal.  ------------------------------------------------------------------- Tricuspid valve:   Doppler:  There was trivial regurgitation.  ------------------------------------------------------------------- Right atrium:  The atrium was mildly dilated.  ------------------------------------------------------------------- Pericardium:  There was no pericardial effusion.  ------------------------------------------------------------------- Systemic veins: Inferior vena cava: The vessel was normal in size. The respirophasic diameter changes were in the normal range (= 50%), consistent with normal central venous pressure. Diameter: 12 mm.  ------------------------------------------------------------------- Post procedure conclusions Ascending Aorta:  - Dilated aortic root and ascending aorta. Ascending aorta dimension: 45 mm.  ------------------------------------------------------------------- Measurements  IVC                                        Value        Reference ID                                         12    mm     ----------  Left ventricle                             Value        Reference LV ID, ED, PLAX chordal                    45.8  mm     43 - 52 LV ID, ES, PLAX chordal                    33.8  mm     23 - 38 LV fx shortening, PLAX chordal     (L)     26    %      >=29 LV PW thickness, ED                        18.5  mm     ---------- IVS/LV PW ratio, ED                         0.91         <=1.3 LV e&', lateral                             5.22  cm/s   ---------- LV E/e&', lateral                           13.66        ---------- LV e&', medial                              4.68  cm/s   ---------- LV E/e&', medial  15.24        ---------- LV e&', average                             4.95  cm/s   ---------- LV E/e&', average                           14.4         ----------  Ventricular septum                         Value        Reference IVS thickness, ED                          16.9  mm     ----------  LVOT                                       Value        Reference LVOT peak velocity, S                      102   cm/s   ---------- LVOT mean velocity, S                      75    cm/s   ---------- LVOT VTI, S                                23.8  cm     ----------  Aorta                                      Value        Reference Aortic root ID, ED                         41    mm     ----------  Left atrium                                Value        Reference LA ID, A-P, ES                             38    mm     ---------- LA ID/bsa, A-P                             1.54  cm/m^2 <=2.2 LA volume, S                               41.3  ml     ---------- LA volume/bsa, S                           16.7  ml/m^2 ---------- LA volume, ES, 1-p A4C  42.5  ml     ---------- LA volume/bsa, ES, 1-p A4C                 17.2  ml/m^2 ---------- LA volume, ES, 1-p A2C                     40.1  ml     ---------- LA volume/bsa, ES, 1-p A2C                 16.2  ml/m^2 ----------  Mitral valve                               Value        Reference Mitral E-wave peak velocity                71.3  cm/s   ---------- Mitral A-wave peak velocity                90.2  cm/s   ---------- Mitral deceleration time           (H)     289   ms     150 - 230 Mitral peak gradient, D                    2     mm Hg  ---------- Mitral E/A ratio,  peak                     0.8          ----------  Pulmonary arteries                         Value        Reference PA pressure, S, DP                         21    mm Hg  <=30  Tricuspid valve                            Value        Reference Tricuspid regurg peak velocity             210   cm/s   ---------- Tricuspid peak RV-RA gradient              18    mm Hg  ---------- Tricuspid maximal regurg velocity,         210   cm/s   ---------- PISA  Right atrium                               Value        Reference RA ID, S-I, ES, A4C                (H)     54.9  mm     34 - 49 RA area, ES, A4C                           19.3  cm^2   8.3 - 19.5 RA volume, ES, A/L  58.2  ml     ---------- RA volume/bsa, ES, A/L                     23.5  ml/m^2 ----------  Systemic veins                             Value        Reference Estimated CVP                              3     mm Hg  ----------  Right ventricle                            Value        Reference TAPSE                                      23.4  mm     ---------- RV s&', lateral, S                          14    cm/s   ----------  Legend: (L)  and  (H)  mark values outside specified reference range.  ------------------------------------------------------------------- Prepared and Electronically Authenticated by  Marca Ancona, M.D. 2019-09-17T18:19:10              Laboratory Data:  High Sensitivity Troponin:   Recent Labs  Lab 09/02/22 1144 09/02/22 1438  TROPONINIHS 150* 169*     Chemistry Recent Labs  Lab 09/02/22 1144  NA 136  K 4.9  CL 111  CO2 15*  GLUCOSE 97  BUN 38*  CREATININE 2.27*  CALCIUM 8.8*  GFRNONAA 29*  ANIONGAP 10    Recent Labs  Lab 09/02/22 1144  PROT 6.5  ALBUMIN 3.5  AST 7*  ALT 5  ALKPHOS 106  BILITOT 0.4   Lipids No results for input(s): "CHOL", "TRIG", "HDL", "LABVLDL", "LDLCALC", "CHOLHDL" in the last 168 hours.  Hematology Recent Labs  Lab  09/02/22 1144  WBC 8.1  RBC 3.67*  HGB 9.9*  HCT 32.1*  MCV 87.5  MCH 27.0  MCHC 30.8  RDW 17.0*  PLT 206   Thyroid  Recent Labs  Lab 09/02/22 1144 09/02/22 1248  TSH  --  2.009  FREET4 0.80  --     BNP Recent Labs  Lab 09/02/22 1231  BNP 804.9*    DDimer No results for input(s): "DDIMER" in the last 168 hours.   Radiology/Studies:  DG Chest Portable 1 View  Result Date: 09/02/2022 CLINICAL DATA:  Shortness of breath EXAM: PORTABLE CHEST 1 VIEW COMPARISON:  None Available. FINDINGS: No pleural effusion. No pneumothorax. No focal airspace opacity. Cardiomegaly. No radiographically apparent displaced rib fractures. Visualized upper abdomen is unremarkable. IMPRESSION: Cardiomegaly.  No pleural effusion or significant pulmonary edema. Electronically Signed   By: Lorenza Cambridge M.D.   On: 09/02/2022 12:16     Assessment and Plan:   #Aflutter with RVR: -Newly diagnosed Aflutter with RVR on this admission -Symptomatic with worsening fatigue and dyspnea on exertion -Started on dilt gtt in ER -Continue heparin gtt for [facility] -Check TTE -If remains in Aflutter with RVR, can plan for TEE/DCCV during admission (discussed he will  need AC for at least 4 weeks following the procedure and he states there is no plan for surgical resection of colon cancer at this time; may have to coordinate [facility] plan with Oncology)  #Elevated troponin: -Suspect demand in the setting of Aflutter with RVR -Myoview 2021 with fixed apical defect but otherwise normal perfusion -Will check TTE to assess further -Continue heparin gtt for Aflutter as above -Planned for V/Q to ensure no evidence of PE per ER team  #SOB: -Likely related to Aflutter with RVR but want to rule out PE given underlying malignancy per ER team -Planned for V/Q scan -Can consider LE dopplers for DVT given R>L LE edema  #Stage IV Colon Cancer: -Follows with Dr. Myrle Sheng  #Chronic LBBB   Risk Assessment/Risk Scores:     TIMI  Risk Score for Unstable Angina or Non-ST Elevation MI:   The patient's TIMI risk score is 3, which indicates a 13% risk of all cause mortality, new or recurrent myocardial infarction or need for urgent revascularization in the next 14 days.{    CHA2DS2-VASc Score = 3  This indicates a 3.2% annual risk of stroke. The patient's score is based upon: CHF History: 0 HTN History: 1 Diabetes History: 0 Stroke History: 0 Vascular Disease History: 0 Age Score: 2 Gender Score: 0         For questions or updates, please contact [facility] Please consult [email or web address] for contact info under    Signed, Meriam Sprague, MD  09/02/2022 8:38 PM

## 2022-09-02 NOTE — ED Notes (Signed)
Cardiology at bedside.

## 2022-09-02 NOTE — Assessment & Plan Note (Signed)
Ob tain anemia panel 

## 2022-09-02 NOTE — ED Notes (Signed)
ED TO INPATIENT HANDOFF REPORT  ED Nurse Name and Phone #: [place] RN [phone]  Pt is alert and oriented, ambulatory at baseline. Reports multiple days of shortness of breath, fatigue, hypotension yesterday. Receiving chemo at this time, right chest port accessed. Pt in sustained Aflutter 130s. Pt is on heparin at 1600 and Dilt is currently running at 5, soon to be increased to 10 - has not affected rate/rhythm at this time. On room air.   S Name/Age/Gender Louis [name] 77 y.o. male Room/Bed: 009C/009C  Code Status   Code Status: Prior  Home/SNF/Other Home Patient oriented to: self, place, time, and situation Is this baseline? Yes   Triage Complete: Triage complete  Chief Complaint Atrial fibrillation with [facility]) [I48.91]  Triage Note Patient here POV from Home.  Endorses Fatigue for over a few days. Notes BP was low yesterday (90/50) and it improved with increased PO Intake. Noted Tachycardia.   No CP. SOB as well.   NAD Noted during Triage. A&Ox4. GCS 15. Ambulatory.     Allergies No Known Allergies  Level of Care/Admitting Diagnosis ED Disposition     ED Disposition  Admit   Condition  --   Comment  Hospital Area: [facility] [[id number]]  Level of Care: Progressive [102]  Admit to Progressive based on following criteria: CARDIOVASCULAR & THORACIC of moderate stability with acute coronary syndrome symptoms/low risk myocardial infarction/hypertensive urgency/arrhythmias/heart failure potentially compromising stability and stable post cardiovascular intervention patients.  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   Past Surgical History:  Procedure Laterality Date   BIOPSY  04/10/2016   Procedure: BIOPSY Mesentary;  Surgeon: Jimmye Norman, MD;  Location: [facility] OR;  Service: General;;   COLONOSCOPY  2019   TA/tubulovillous adenoma   COLOSTOMY  2018   reversed   COLOSTOMY REVERSAL N/A 01/12/2017   Procedure: COLOSTOMY REVERSAL;  Surgeon: Jimmye Norman, MD;  Location: [facility] OR;  Service: General;  Laterality: N/A;   FLEXIBLE SIGMOIDOSCOPY N/A 04/09/2016   Procedure: FLEXIBLE SIGMOIDOSCOPY;  Surgeon: Meryl Dare, MD;  Location: [facility] ENDOSCOPY;  Service: Endoscopy;  Laterality: N/A;   HERNIA REPAIR     in childhood   INGUINAL HERNIA REPAIR Right 03/29/2018   Procedure: OPEN REPAIR OF RIGHT INGUINAL HERNIA WITH MESH;  Surgeon: Jimmye Norman, MD;  Location: [facility] OR;  Service: General;  Laterality: Right;   INSERTION OF MESH N/A 03/29/2018  Procedure: INSERTION OF MESH;  Surgeon: Jimmye Norman, MD;  Location: [facility] OR;  Service: General;  Laterality: N/A;   IR GENERIC HISTORICAL  05/14/2016   IR US GUIDE VASC ACCESS RIGHT 05/14/2016 Gilmer Mor, DO [facility]INTER[facility]   IR GENERIC HISTORICAL  05/14/2016   IR FLUORO GUIDE PORT INSERTION RIGHT 05/14/2016 Gilmer Mor, DO [facility]   IR IMAGING GUIDED PORT INSERTION  12/17/2020   IR REMOVAL TUN ACCESS W/ PORT W/O FL MOD SED  11/11/2016   PARTIAL COLECTOMY N/A 04/10/2016    Procedure: SIGMOID  COLECTOMY WITH COLOSTOMY;  Surgeon: Jimmye Norman, MD;  Location: [facility] OR;  Service: General;  Laterality: N/A;   TONSILLECTOMY     TOTAL KNEE ARTHROPLASTY Left 09/29/2019   Procedure: TOTAL KNEE ARTHROPLASTY;  Surgeon: Durene Romans, MD;  Location: [facility] ORS;  Service: Orthopedics;  Laterality: Left;  70 mins     A IV Location/Drains/Wounds Patient Lines/Drains/Airways Status     Active Line/Drains/Airways     Name Placement date Placement time Site Days   Implanted Port Right Chest --  --  Chest  --            Intake/Output Last 24 hours  Intake/Output Summary (Last 24 hours) at 09/02/2022 1922 Last data filed at 09/02/2022 1349 Gross per 24 hour  Intake 1000 ml  Output --  Net 1000 ml    Labs/Imaging Results for orders placed or performed during the hospital encounter of 09/02/22 (from the past 48 hour(s))  Basic metabolic panel     Status: Abnormal   Collection Time: 09/02/22 11:44 AM  Result Value Ref Range   Sodium 136 135 - 145 mmol/L   Potassium 4.9 3.5 - 5.1 mmol/L   Chloride 111 98 - 111 mmol/L   CO2 15 (L) 22 - 32 mmol/L   Glucose, Bld 97 70 - 99 mg/dL    Comment: Glucose reference range applies only to samples taken after fasting for at least 8 hours.   BUN 38 (H) 8 - 23 mg/dL   Creatinine, Ser 1.61 (H) 0.61 - 1.24 mg/dL   Calcium 8.8 (L) 8.9 - 10.3 mg/dL   GFR, Estimated 29 (L) >60 mL/min    Comment: (NOTE) Calculated using the CKD-EPI Creatinine Equation (2021)    Anion gap 10 5 - 15    Comment: Performed at [facility], [address] [place], [place] [address]  CBC     Status: Abnormal   Collection Time: 09/02/22 11:44 AM  Result Value Ref Range   WBC 8.1 4.0 - 10.5 K/uL   RBC 3.67 (L) 4.22 - 5.81 MIL/uL   Hemoglobin 9.9 (L) 13.0 - 17.0 g/dL   HCT 54.0 (L) 98.1 - 19.1 %   MCV 87.5 80.0 - 100.0 fL   MCH 27.0 26.0 - 34.0 pg   MCHC 30.8 30.0 - 36.0 g/dL   RDW 47.8 (H) 29.5 - 62.1 %   Platelets 206 150 - 400  K/uL   nRBC 0.0 0.0 - 0.2 %    Comment: Performed at [facility], [address] [place], [place] [address]  Troponin I (High Sensitivity)     Status: Abnormal   Collection Time: 09/02/22 11:44 AM  Result Value Ref Range   Troponin I (High Sensitivity) 150 (HH) <18 ng/L    Comment: CRITICAL RESULT CALLED TO, READ BACK BY AND VERIFIED WITH: MADISON FOUNTAIN,RN AT 1353 ON 09/02/22 BY BLITTLE (NOTE) Elevated high sensitivity troponin I (hsTnI) values and significant  changes  across serial measurements may suggest ACS but many other  chronic and acute conditions are known to elevate hsTnI results.  Refer to the Links section for chest pain algorithms and additional  guidance. Performed at [facility], [address], [place], [place] [address]   T4, free     Status: None   Collection Time: 09/02/22 11:44 AM  Result Value Ref Range   Free T4 0.80 0.61 - 1.12 ng/dL    Comment: (NOTE) Biotin ingestion may interfere with free T4 tests. If the results are inconsistent with the TSH level, previous test results, or the clinical presentation, then consider biotin interference. If needed, order repeat testing after stopping biotin. Performed at [facility], [address], [place], [place] [address]   Hepatic function panel     Status: Abnormal   Collection Time: 09/02/22 11:44 AM  Result Value Ref Range   Total Protein 6.5 6.5 - 8.1 g/dL   Albumin 3.5 3.5 - 5.0 g/dL   AST 7 (L) 15 - 41 U/L   ALT 5 0 - 44 U/L   Alkaline Phosphatase 106 38 - 126 U/L   Total Bilirubin 0.4 0.3 - 1.2 mg/dL   Bilirubin, Direct 0.1 0.0 - 0.2 mg/dL   Indirect Bilirubin 0.3 0.3 - 0.9 mg/dL    Comment: Performed at [facility], [address] [place], [place] [address]  Brain natriuretic peptide     Status: Abnormal   Collection Time: 09/02/22 12:31 PM  Result Value Ref Range   B Natriuretic Peptide 804.9 (H) 0.0 - 100.0 pg/mL    Comment:  Performed at [facility], [address], [place], [place] [address]  TSH     Status: None   Collection Time: 09/02/22 12:48 PM  Result Value Ref Range   TSH 2.009 0.350 - 4.500 uIU/mL    Comment: Performed by a 3rd Generation assay with a functional sensitivity of <=0.01 uIU/mL. Performed at [facility], [address] [place], [place] [address]   Troponin I (High Sensitivity)     Status: Abnormal   Collection Time: 09/02/22  2:38 PM  Result Value Ref Range   Troponin I (High Sensitivity) 169 (HH) <18 ng/L    Comment: CRITICAL VALUE NOTED.  VALUE IS CONSISTENT WITH PREVIOUSLY REPORTED AND CALLED VALUE. (NOTE) Elevated high sensitivity troponin I (hsTnI) values and significant  changes across serial measurements may suggest ACS but many other  chronic and acute conditions are known to elevate hsTnI results.  Refer to the Links section for chest pain algorithms and additional  guidance. Performed at [facility], [address], [place], [place] [address]    DG Chest Portable 1 View  Result Date: 09/02/2022 CLINICAL DATA:  Shortness of breath EXAM: PORTABLE CHEST 1 VIEW COMPARISON:  None Available. FINDINGS: No pleural effusion. No pneumothorax. No focal airspace opacity. Cardiomegaly. No radiographically apparent displaced rib fractures. Visualized upper abdomen is unremarkable. IMPRESSION: Cardiomegaly.  No pleural effusion or significant pulmonary edema. Electronically Signed   By: Lorenza Cambridge M.D.   On: 09/02/2022 12:16    Pending Labs Unresulted Labs (From admission, onward)     Start     Ordered   09/03/22 0500  Prealbumin  Tomorrow morning,   R        09/02/22 1917   09/03/22 0500  Vitamin B12  (Anemia Panel (PNL))  Tomorrow morning,   R        09/02/22 1917   09/03/22  0500  Folate  (Anemia Panel (PNL))  Tomorrow morning,   R        09/02/22 1917   09/02/22 2300  Heparin level (unfractionated)  Once-Timed,    URGENT        09/02/22 1701   09/02/22 1919  D-dimer, quantitative  Add-on,   AD        09/02/22 1918   09/02/22 1918  CK  Add-on,   AD        09/02/22 1917   09/02/22 1918  CBC with Differential/Platelet  Add-on,   AD       Question:  Release to patient  Answer:  Immediate   09/02/22 1917   09/02/22 1918  Lactic acid, plasma  STAT Now then every 3 hours,   R     Question:  Release to patient  Answer:  Immediate   09/02/22 1917   09/02/22 1918  Magnesium  Add-on,   AD        09/02/22 1917   09/02/22 1918  Phosphorus  Add-on,   AD        09/02/22 1917   09/02/22 1918  Urinalysis, Complete w Microscopic -Urine, Clean Catch  Once,   R       Question Answer Comment  Release to patient Immediate   Specimen Source Urine, Clean Catch      09/02/22 1917   09/02/22 1918  Iron and TIBC  (Anemia Panel (PNL))  Add-on,   AD        09/02/22 1917   09/02/22 1918  Ferritin  (Anemia Panel (PNL))  Add-on,   AD        09/02/22 1917   09/02/22 1918  Reticulocytes  (Anemia Panel (PNL))  Add-on,   AD        09/02/22 1917            Vitals/Pain Today's Vitals   09/02/22 1730 09/02/22 1800 09/02/22 1830 09/02/22 1900  BP: (!) 146/108 (!) 144/100 (!) 146/110 (!) 152/112  Pulse: (!) 135 (!) 134 (!) 136 (!) 136  Resp: 17  (!) 28 (!) 24  Temp:      TempSrc:      SpO2: 99% 98% 94% 94%  Weight:      Height:      PainSc:        Isolation Precautions No active isolations  Medications Medications  heparin ADULT infusion 100 units/mL (25000 units/2106mL) (1,600 Units/hr Intravenous New Bag/Given 09/02/22 1442)  aspirin tablet 325 mg (325 mg Oral Given 09/02/22 1626)  diltiazem (CARDIZEM) 125 mg in dextrose 5% 125 mL (1 mg/mL) infusion (5 mg/hr Intravenous New Bag/Given 09/02/22 1905)  lactated ringers bolus 1,000 mL (0 mLs Intravenous Stopped 09/02/22 1349)  heparin bolus via infusion 6,000 Units (6,000 Units Intravenous Bolus from Bag 09/02/22 1443)  lactated ringers bolus 1,000 mL (0 mLs  Intravenous Stopped 09/02/22 1729)  diltiazem (CARDIZEM) injection 10 mg (10 mg Intravenous Given 09/02/22 1905)    Mobility walks     Focused Assessments Cardiac Assessment Handoff:  Cardiac Rhythm: Atrial flutter, Bundle branch block No results found for: "CKTOTAL", "CKMB", "CKMBINDEX", "TROPONINI" No results found for: "DDIMER" Does the Patient currently have chest pain? No    R Recommendations: See Admitting Provider Note  Report given to:   Additional Notes: See top of SBAR

## 2022-09-02 NOTE — H&P (Signed)
Louis [name] ZOX:[id number] DOB: 11-03-1945 DOA: 09/02/2022     PCP: [name], MD   Outpatient Specialists:  CARDS:  Dr. Garwin Brothers, MD years ago NEphrology:   Dr. Abel Presto    Oncology Dr. Myrle Sheng    Patient arrived to ER on 09/02/22 at 1131 Referred by Attending Charlynne Pander, MD   Patient coming from:    home Lives   With family    Chief Complaint:   Chief Complaint  Patient presents with   Shortness of Breath    HPI: ABDIAZIZ [name] is a 77 y.o. 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  Noted to be hypotensive and 90s over 50s and tachycardic up to 130s at home some associated shortness of breath no chest pain shortness of breath is somewhat chronic.  No fevers no chills no cough patient got rehydrated and blood pressure improved he did [facility] where he was found to be in A-fib with RVR Plan was for patient to be transferred to [facility] for VQ scan by time patient arrived VQ scan facility is closed.  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 Trace edema Pt takes Doxycycline bc of chemo Denies any bleeding no black stools  No falls  Denies significant ETOH intake 1-2 glasses a week Does not smoke   Lab Results  Component Value Date   SARSCOV2NAA NEGATIVE 09/26/2019      Regarding pertinent Chronic problems:     Hyperlipidemia - stopped fish oil Lipid Panel     Component Value Date/Time   CHOL 159 07/02/2016 0808   TRIG 146 07/02/2016 0808   HDL 47 07/02/2016 0808   CHOLHDL 3.4 07/02/2016 0808   LDLCALC 83 07/02/2016 0808   LABVLDL 29  07/02/2016 0808     HTN on NOrvasc, labetalol lisinopril  has been cutting down ,    chronic CHF diastolic  - last echo  Recent Results (from the past 81191 hour(s))  ECHOCARDIOGRAM COMPLETE   Collection Time: 01/19/18  2:07 PM  Result Value   Weight 3,952   Height 76.000   Narrative          ------------------------------------------------------------------- LV EF: 55%  ------------------------------------------------------------------- Indications:      New onset LBBB (I44.7).  ------------------------------------------------------------------- History:   PMH:  HLD, CKD.  Murmur.  Risk factors:  Hypertension.   ------------------------------------------------------------------- Study Conclusions  - Left ventricle: The cavity size was normal. Wall thickness was   increased in a pattern of moderate LVH. The estimated ejection   fraction was 55%. Septal-lateral dyssynchrony consistent with   LBBB. Doppler parameters are consistent with abnormal left   ventricular relaxation (grade 1 diastolic dysfunction). - Aortic valve: There was no stenosis. - Aorta: Dilated aortic root and ascending aorta. Ascending aorta   dimension: 45 mm. Aortic root dimension: 41 mm (ED). - Mitral valve: There was no significant regurgitation. - Right ventricle: The cavity size was normal. Systolic function   was normal. - Right atrium: The atrium was mildly dilated. - Tricuspid valve: Peak RV-RA gradient (S): 18 mm Hg. -  Pulmonary arteries: PA peak pressure: 21 mm Hg (S). - Inferior vena cava: The vessel was normal in size. The   respirophasic diameter changes were in the normal range (= 50%),   consistent with normal central venous pressure.  Impressions:  - Normal LV size with moderate LV hypertrophy. EF 55% with   septal-lateral dyssynchrony consistent with LBBB. Normal RV size   and systolic function. No significant valvular abnormalities.              CKD stage IV- baseline Cr  2.2 Estimated Creatinine Clearance: 34.5 mL/min (A) (by C-G formula based on SCr of 2.27 mg/dL (H)).  Lab Results  Component Value Date   CREATININE 2.27 (H) 09/02/2022   CREATININE 2.28 (H) 08/12/2022   CREATININE 2.20 (H) 07/23/2022     Chronic anemia - baseline hg Hemoglobin & Hematocrit  Recent Labs    08/05/22 0831 08/12/22 1025 09/02/22 1144  HGB 10.1* 9.9* 9.9*   Iron/TIBC/Ferritin/ %Sat    Component Value Date/Time   FERRITIN 51 08/05/2022 0831     While in ER: Clinical Course as of 09/02/22 1908  Tue Sep 02, 2022  1417 Troponin I (High Sensitivity)(!!): 150 [JL]  1417 B Natriuretic Peptide(!): 804.9 [JL]    Clinical Course User Index [JL] Ernie Avena, MD      Lab Orders         Basic metabolic panel         CBC         TSH         T4, free         Hepatic function panel         Brain natriuretic peptide         Heparin level (unfractionated)       CXR - Cardiomegaly.     Following Medications were ordered in ER: Medications  heparin ADULT infusion 100 units/mL (25000 units/256mL) (1,600 Units/hr Intravenous New Bag/Given 09/02/22 1442)  aspirin tablet 325 mg (325 mg Oral Given 09/02/22 1626)  diltiazem (CARDIZEM) 125 mg in dextrose 5% 125 mL (1 mg/mL) infusion (5 mg/hr Intravenous New Bag/Given 09/02/22 1905)  lactated ringers bolus 1,000 mL (0 mLs Intravenous Stopped 09/02/22 1349)  heparin bolus via infusion 6,000 Units (6,000 Units Intravenous Bolus from Bag 09/02/22 1443)  lactated ringers bolus 1,000 mL (0 mLs Intravenous Stopped 09/02/22 1729)  diltiazem (CARDIZEM) injection 10 mg (10 mg Intravenous Given 09/02/22 1905)    _______________________________________________________ ER Provider Called:  Cardiology   Dr.Pembrook They Recommend admit to medicine  start diltiazem drip Will see in   ER   ED Triage Vitals  Enc Vitals Group     BP 09/02/22 1137 (!) 151/97     Pulse Rate 09/02/22 1137 (!) 130     Resp 09/02/22 1137 18     Temp 09/02/22  1137 98 F (36.7 C)     Temp Source 09/02/22 1137 Oral     SpO2 09/02/22 1137 100 %     Weight 09/02/22 1142 220 lb (99.8 kg)     Height 09/02/22 1142 6\' 4"  (1.93 m)     Head Circumference --      Peak Flow --      Pain Score 09/02/22 1513 0     Pain Loc --      Pain Edu? --      Excl. in GC? --   TMAX(24)@     _________________________________________ Significant initial  Findings: Abnormal Labs Reviewed  BASIC  METABOLIC PANEL - Abnormal; Notable for the following components:      Result Value   CO2 15 (*)    BUN 38 (*)    Creatinine, Ser 2.27 (*)    Calcium 8.8 (*)    GFR, Estimated 29 (*)    All other components within normal limits  CBC - Abnormal; Notable for the following components:   RBC 3.67 (*)    Hemoglobin 9.9 (*)    HCT 32.1 (*)    RDW 17.0 (*)    All other components within normal limits  HEPATIC FUNCTION PANEL - Abnormal; Notable for the following components:   AST 7 (*)    All other components within normal limits  BRAIN NATRIURETIC PEPTIDE - Abnormal; Notable for the following components:   B Natriuretic Peptide 804.9 (*)    All other components within normal limits  TROPONIN I (HIGH SENSITIVITY) - Abnormal; Notable for the following components:   Troponin I (High Sensitivity) 150 (*)    All other components within normal limits  TROPONIN I (HIGH SENSITIVITY) - Abnormal; Notable for the following components:   Troponin I (High Sensitivity) 169 (*)    All other components within normal limits      _________________________ Troponin  Cardiac Panel (last 3 results) Recent Labs    09/02/22 1144 09/02/22 1438  TROPONINIHS 150* 169*     ECG: Ordered Personally reviewed and interpreted by me showing: HR : 133 Rhythm:  A.fib. W RVR, LBBB,    nonspecific changes  QTC 524  BNP (last 3 results) Recent Labs    09/02/22 1231  BNP 804.9*     COVID-19 Labs  No results for input(s): "DDIMER", "FERRITIN", "LDH", "CRP" in the last 72 hours.  Lab  Results  Component Value Date   SARSCOV2NAA NEGATIVE 09/26/2019     The recent clinical data is shown below. Vitals:   09/02/22 1730 09/02/22 1800 09/02/22 1830 09/02/22 1900  BP: (!) 146/108 (!) 144/100 (!) 146/110 (!) 152/112  Pulse: (!) 135 (!) 134 (!) 136 (!) 136  Resp: 17  (!) 28 (!) 24  Temp:      TempSrc:      SpO2: 99% 98% 94% 94%  Weight:      Height:        WBC     Component Value Date/Time   WBC 8.1 09/02/2022 1144   LYMPHSABS 1.1 08/12/2022 1025   LYMPHSABS 1.6 01/27/2017 1450   MONOABS 0.7 08/12/2022 1025   MONOABS 0.7 01/27/2017 1450   EOSABS 0.2 08/12/2022 1025   EOSABS 0.4 01/27/2017 1450   BASOSABS 0.1 08/12/2022 1025   BASOSABS 0.0 01/27/2017 1450      UA  ordered     Results for orders placed or performed in visit on 04/16/22  Urine Culture     Status: None   Collection Time: 04/16/22 10:40 AM   Specimen: Urine, Clean Catch  Result Value Ref Range Status   Specimen Description   Final    URINE, CLEAN CATCH Performed at [facility], [address] [place], [place] [address]    Special Requests   Final    NONE Performed at [facility], [address] [place], [place] [address]    Culture   Final    NO GROWTH Performed at [facility], [address], [place], [place] [address]    Report Status 04/17/2022 FINAL  Final      __________________________________________________________ Recent Labs  Lab 09/02/22  1144  NA 136  K 4.9  CO2 15*  GLUCOSE 97  BUN 38*  CREATININE 2.27*  CALCIUM 8.8*    Cr   stable,   Lab Results  Component Value Date   CREATININE 2.27 (H) 09/02/2022   CREATININE 2.28 (H) 08/12/2022   CREATININE 2.20 (H) 07/23/2022    Recent Labs  Lab 09/02/22 1144  AST 7*  ALT 5  ALKPHOS 106  BILITOT 0.4  PROT 6.5  ALBUMIN 3.5   Lab Results  Component Value Date   CALCIUM 8.8 (L) 09/02/2022   PHOS 3.0 04/09/2016    Plt: Lab Results  Component Value Date    PLT 206 09/02/2022       Recent Labs  Lab 09/02/22 1144  WBC 8.1  HGB 9.9*  HCT 32.1*  MCV 87.5  PLT 206    HG/HCT   stable,     Component Value Date/Time   HGB 9.9 (L) 09/02/2022 1144   HGB 9.9 (L) 08/12/2022 1025   HGB 13.3 01/27/2017 1450   HCT 32.1 (L) 09/02/2022 1144   HCT 40.0 01/27/2017 1450   MCV 87.5 09/02/2022 1144   MCV 89.9 01/27/2017 1450    _______________________________________________ Hospitalist was called for admission for a.fib w RVR. And elevated troponin  The following Work up has been ordered so far:  Orders Placed This Encounter  Procedures   DG Chest Portable 1 View   NM PULMONARY VENT AND PERF (V/Q Scan)   Basic metabolic panel   CBC   TSH   T4, free   Hepatic function panel   Brain natriuretic peptide   Heparin level (unfractionated)   Document Height and Actual Weight   heparin per pharmacy consult   Consult to hospitalist   Inpatient consult to Cardiology   Inpatient consult to Cardiology   Consult to hospitalist   EKG 12-Lead   ED EKG   Repeat EKG   EKG 12-Lead     OTHER Significant initial  Findings:  labs showing:          Cultures:    Component Value Date/Time   SDES  04/16/2022 1040    URINE, CLEAN CATCH Performed at [facility], [address], [place], [place] [address]    [facility]  04/16/2022 1040    NONE Performed at [facility], [address] [place], [place] [address]    CULT  04/16/2022 1040    NO GROWTH Performed at [facility], [address] [place], [place] [address]    REPTSTATUS 04/17/2022 FINAL 04/16/2022 1040     Radiological Exams on Admission: DG Chest Portable 1 View  Result Date: 09/02/2022 CLINICAL DATA:  Shortness of breath EXAM: PORTABLE CHEST 1 VIEW COMPARISON:  None Available. FINDINGS: No pleural effusion. No pneumothorax. No focal airspace opacity. Cardiomegaly. No radiographically apparent displaced rib fractures. Visualized  upper abdomen is unremarkable. IMPRESSION: Cardiomegaly.  No pleural effusion or significant pulmonary edema. Electronically Signed   By: Lorenza Cambridge M.D.   On: 09/02/2022 12:16   _______________________________________________________________________________________________________ Latest  Blood pressure (!) 152/112, pulse (!) 136, temperature 98 F (36.7 C), resp. rate (!) 24, height 6\' 4"  (1.93 m), weight 99.8 kg, SpO2 94 %.   Vitals  labs and radiology finding personally reviewed  Review of Systems:    Pertinent positives include:   fatigue, Bilateral lower extremity swelling weight loss  Constitutional:  No weight loss, night sweats, Fevers, chills,  HEENT:  No headaches, Difficulty swallowing,Tooth/dental problems,Sore throat,  No sneezing, itching, ear ache, nasal congestion, post nasal drip,  Cardio-vascular:  No chest pain, Orthopnea, PND, anasarca, dizziness, palpitations.no  GI:  No heartburn, indigestion, abdominal pain, nausea, vomiting, diarrhea, change in bowel habits, loss of appetite, melena, blood in stool, hematemesis Resp:  no shortness of breath at rest. No dyspnea on exertion, No excess mucus, no productive cough, No non-productive cough, No coughing up of blood.No change in color of mucus.No wheezing. Skin:  no rash or lesions. No jaundice GU:  no dysuria, change in color of urine, no urgency or frequency. No straining to urinate.  No flank pain.  Musculoskeletal:  No joint pain or no joint swelling. No decreased range of motion. No back pain.  Psych:  No change in mood or affect. No depression or anxiety. No memory loss.  Neuro: no localizing neurological complaints, no tingling, no weakness, no double vision, no gait abnormality, no slurred speech, no confusion  All systems reviewed and apart from HOPI all are negative _______________________________________________________________________________________________ Past Medical History:   Past Medical  History:  Diagnosis Date   Allergy    seasonal allergies   Anemia    Arthritis 2010   Ascending aorta dilatation (HCC)    4.1 cm noted on CT1/15/21   Blood transfusion without reported diagnosis    CKD (chronic kidney disease) stage 2, GFR 60-89 ml/min    see [place], Stage 1   colon ca dx'd 04/2016   colon   Hernia, abdominal    sx fixed   Hyperlipidemia    on meds   Hypertension    on meds   LBBB (left bundle branch block) 08/18/2019   Neuropathy    Pneumonia 05/19/2018   Seasonal allergies    Thrombocytopenia (HCC)    during chemo.     Past Surgical History:  Procedure Laterality Date   BIOPSY  04/10/2016   Procedure: BIOPSY Mesentary;  Surgeon: Jimmye Norman, MD;  Location: [facility] OR;  Service: General;;   COLONOSCOPY  2019   TA/tubulovillous adenoma   COLOSTOMY  2018   reversed   COLOSTOMY REVERSAL N/A 01/12/2017   Procedure: COLOSTOMY REVERSAL;  Surgeon: Jimmye Norman, MD;  Location[facility] OR;  Service: General;  Laterality: N/A;   FLEXIBLE SIGMOIDOSCOPY N/A 04/09/2016   Procedure: FLEXIBLE SIGMOIDOSCOPY;  Surgeon: Meryl Dare, MD;  Location: [facility] ENDOSCOPY;  Service: Endoscopy;  Laterality: N/A;   HERNIA REPAIR     in childhood   INGUINAL HERNIA REPAIR Right 03/29/2018   Procedure: OPEN REPAIR OF RIGHT INGUINAL HERNIA WITH MESH;  Surgeon: Jimmye Norman, MD;  Location: [facility] OR;  Service: General;  Laterality: Right;   INSERTION OF MESH N/A 03/29/2018   Procedure: INSERTION OF MESH;  Surgeon: Jimmye Norman, MD;  Location: [facility] OR;  Service: General;  Laterality: N/A;   IR GENERIC HISTORICAL  05/14/2016   IR US GUIDE VASC ACCESS RIGHT 05/14/2016 Gilmer Mor, DO [facility]   IR GENERIC HISTORICAL  05/14/2016   IR FLUORO GUIDE PORT INSERTION RIGHT 05/14/2016 Gilmer Mor, DO [facility]   IR IMAGING GUIDED PORT INSERTION  12/17/2020   IR REMOVAL TUN ACCESS W/ PORT W/O FL MOD SED  11/11/2016   PARTIAL COLECTOMY N/A 04/10/2016   Procedure: SIGMOID  COLECTOMY WITH COLOSTOMY;   Surgeon: Jimmye Norman, MD;  Location: [facility] OR;  Service: General;  Laterality: N/A;   TONSILLECTOMY     TOTAL KNEE ARTHROPLASTY Left 09/29/2019   Procedure: TOTAL KNEE ARTHROPLASTY;  Surgeon: Durene Romans, MD;  Location: [facility] ORS;  Service: Orthopedics;  Laterality: Left;  70 mins    Social History:  Ambulatory   independently       reports that he has never smoked. He has never been exposed to tobacco smoke. He has never used smokeless tobacco. He reports current alcohol use of about 3.0 - 4.0 standard drinks of alcohol per week. He reports that he does not use drugs.    Family History:  Family History  Problem Relation Age of Onset   Hyperlipidemia Father    Diverticulitis Brother    Colon cancer Neg Hx    Esophageal cancer Neg Hx    Rectal cancer Neg Hx    Stomach cancer Neg Hx    Colon polyps Neg Hx    Crohn's disease Neg Hx    ______________________________________________________________________________________________ Allergies: No Known Allergies   Prior to Admission medications   Medication Sig Start Date End Date Taking? Authorizing Provider  acetaminophen (TYLENOL) 325 MG tablet Tylenol   Yes [provider]  amLODipine (NORVASC) 10 MG tablet Take 10 mg by mouth daily. 02/11/16  Yes [provider]  doxycycline (VIBRA-TABS) 100 MG tablet TAKE 1 TABLET(100 MG) BY MOUTH TWICE DAILY 08/04/22  Yes Ladene Artist, MD  encorafenib (BRAFTOVI) 75 MG capsule Take 4 capsules (300 mg total) by mouth daily. 08/18/22  Yes Ladene Artist, MD  ferrous sulfate 325 (65 FE) MG EC tablet Take 1 tablet (325 mg total) by mouth 2 (two) times daily with a meal. 07/23/22  Yes Rana Snare, NP  labetalol (NORMODYNE) 200 MG tablet Take 200 mg by mouth 3 (three) times daily.  04/23/16  Yes [provider]  lidocaine-prilocaine (EMLA) cream Apply 1 Application topically as needed. Apply 1/2 tablespoon to port site 2 hours prior to stick and cover with plastic wrap to numb  site. DO NOT USE UNTIL 14 DAYS AFTER PORT IS PLACED. 03/19/22  Yes Ladene Artist, MD  lisinopril (ZESTRIL) 5 MG tablet Take 1 tablet by mouth daily.   Yes [provider]  loratadine (CLARITIN) 10 MG tablet Take 10 mg by mouth daily as needed for allergies.    Yes [provider]  losartan (COZAAR) 25 MG tablet Take 25 mg by mouth daily. 04/18/20  Yes [provider]  Magnesium Oxide 400 MG CAPS Take 1 capsule (400 mg total) by mouth 2 (two) times daily. 07/23/22  Yes Rana Snare, NP  ondansetron (ZOFRAN) 8 MG tablet Take 1 tablet (8 mg total) by mouth every 8 (eight) hours as needed for nausea or vomiting. 12/07/20  Yes Ladene Artist, MD  prochlorperazine (COMPAZINE) 10 MG tablet Take 10 mg by mouth every 6 (six) hours as needed for nausea or vomiting.   Yes [provider]    ___________________________________________________________________________________________________ Physical Exam:    09/02/2022    7:00 PM 09/02/2022    6:30 PM 09/02/2022    6:00 PM  Vitals with BMI  Systolic 152 146 742  Diastolic 112 110 595  Pulse 136 136 134     1. General:  in No  Acute distress   Chronically ill -appearing 2. Psychological: Alert and   Oriented 3. Head/ENT:  Dry Mucous Membranes                          Head Non traumatic, neck supple  Poor Dentition 4. SKIN:  decreased Skin turgor,  Skin clean Dry and intact no rash    5. Heart rapid irRegular rate and rhythm no  Murmur, no Rub or gallop 6. Lungs:  no wheezes   crackles   7. Abdomen: Soft,  non-tender, Non distended bowel sounds present 8. Lower extremities: no clubbing, cyanosis,  edema right leg edema 9. Neurologically Grossly intact, moving all 4 extremities equally   10. MSK: Normal range of motion    Chart has been reviewed  ______________________________________________________________________________________________  Assessment/Plan  77 y.o. 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,   Ascending aortic dilatation, Anemia,    Admitted for a.fib w rvr   Present on Admission:  Atrial fibrillation with RVR (HCC)  Essential hypertension  Adenocarcinoma of colon (HCC)  CKD (chronic kidney disease), stage IV (HCC)  Elevated troponin  Anemia  Metabolic acidosis  Leg edema, right     Atrial fibrillation with RVR (HCC)  - Admit to step down on Cardizem drip       CHA2D-VASC score >2       Will start on  Heparin,                   Check TSH      Cycle cardiac enzymes      Obtain ECHO      Cardiology consult in AM Given sudden onset of hypotention/ tachy hx of cancer VQ scan ordered Not a candidate for CTa   Essential hypertension Hold home meds given recent hypotension   Adenocarcinoma of colon (HCC) Will email oncology that pt is being admitted  CKD (chronic kidney disease), stage IV (HCC)  -chronic avoid nephrotoxic medications such as NSAIDs, Vanco Zosyn combo,  avoid hypotension, continue to follow renal function   Elevated troponin Patient currently denies any chest pain  Probably demand ischemia in the setting of dehydration a.fib w rvr  - EKG without evidence of acute ischemia  - Admitted to telemetry   - continue to cycle cardiac enzymes   - obtain echogram in AM  - cardiology consult     Anemia Obtain anemia panel  Metabolic acidosis Could be in the setting of CKD  May need to start bicarbonate  Will check lactic acid  Leg edema, right Obtain dopplers    Other plan as per orders.  DVT prophylaxis:  Heparin     Code Status:   DNR/DNI  as per patient   I had personally discussed CODE STATUS with patient    ACP none   Family Communication:   Family  at  Bedside  plan of care was discussed  with   Daughter, Wife,    Diet  heart healthy   Disposition Plan:      To home once workup is complete and patient is stable   Following  barriers for discharge:                            Electrolytes corrected                                                         Will need consultants to evaluate patient prior to discharge  Consult Orders  (From admission, onward)           Start     Ordered   09/02/22 1842  Consult to hospitalist  Paged by Annice Pih  Once       Provider:  (Not yet assigned)  Question Answer Comment  Place call to: [facility]   Reason for Consult Admit      09/02/22 1842   09/02/22 1430  Consult to hospitalist  Selena Batten with [facility] called for consult  Once       Provider:  (Not yet assigned)  Question Answer Comment  Place call to: [facility]   Reason for Consult Admit      09/02/22 1429                              Transition of care consulted                    Consults called: Cardiology is aware  Emailed oncology   Emailed and sent a page to nephrology to let them know pt is here   Admission status:  ED Disposition     ED Disposition  Admit   Condition  --   Comment  Hospital [facility] [[id number]]  Level of Care: Progressive [102]  Admit to Progressive based on following criteria: CARDIOVASCULAR & THORACIC of moderate stability with acute coronary syndrome symptoms/low risk myocardial infarction/hypertensive urgency/arrhythmias/heart failure potentially compromising stability and stable post cardiovascular intervention patients.  May place patient in [facility] at [facility] or [facility] if equivalent level of care is available:: No  Covid Evaluation: Asymptomatic - no recent exposure (last 10 days) testing not required  Diagnosis: Atrial fibrillation with RVR [facility]) [[id number]]  Admitting Physician: Therisa Doyne [[id number]]  Attending Physician: Therisa Doyne [[id number]]           Obs    Level of care    progressive tele indefinitely please discontinue once patient no longer qualifies COVID-19 Labs     Phill Steck 09/02/2022, 8:48 PM    [facility]     after 2 AM please page floor coverage PA If 7AM-7PM, please contact the day team taking care of the patient using [email or web address]

## 2022-09-02 NOTE — Progress Notes (Signed)
ANTICOAGULATION CONSULT NOTE - Initial Consult  Pharmacy Consult for heparin Indication: r/o pulmonary embolus  No Known Allergies  Patient Measurements: Height: 6\' 4"  (193 cm) Weight: 99.8 kg (220 lb) IBW/kg (Calculated) : 86.8 Heparin Dosing Weight: 99.8kg  Vital Signs: Temp: 98 F (36.7 C) (04/30 1137) Temp Source: Oral (04/30 1137) BP: 149/105 (04/30 1400) Pulse Rate: 134 (04/30 1400)  Labs: Recent Labs    09/02/22 1144  HGB 9.9*  HCT 32.1*  PLT 206  CREATININE 2.27*  TROPONINIHS 150*    Estimated Creatinine Clearance: 34.5 mL/min (A) (by C-G formula based on SCr of 2.27 mg/dL (H)).   Medical History: Past Medical History:  Diagnosis Date   Allergy    seasonal allergies   Anemia    Arthritis 2010   Ascending aorta dilatation (HCC)    4.1 cm noted on CT1/15/21   Blood transfusion without reported diagnosis    CKD (chronic kidney disease) stage 2, GFR 60-89 ml/min    see [place], Stage 1   colon ca dx'd 04/2016   colon   Hernia, abdominal    sx fixed   Hyperlipidemia    on meds   Hypertension    on meds   LBBB (left bundle branch block) 08/18/2019   Neuropathy    Pneumonia 05/19/2018   Seasonal allergies    Thrombocytopenia (HCC)    during chemo.    Medications:  Infusions:   heparin      Assessment: 76 yom presented to the ED with SOB. To start empiric heparin for possible PE. Baseline Hgb is low at 9.9 and platelets are WNL. He is not on anticoagulation PTA.   Goal of Therapy:  Heparin level 0.3-0.7 units/ml Monitor platelets by anticoagulation protocol: Yes   Plan:  Heparin bolus 6000 units IV x 1 Heparin gtt 1600 units/hr Check an 8 hr heparin level Daily heparin level and CBC  Haleema Vanderheyden, [name] Leach 09/02/2022,2:34 PM

## 2022-09-02 NOTE — Assessment & Plan Note (Signed)
Patient currently denies any chest pain  Probably demand ischemia in the setting of dehydration a.fib w rvr  - EKG without evidence of acute ischemia  - Admitted to telemetry   - continue to cycle cardiac enzymes   - obtain echogram in AM  - cardiology consult

## 2022-09-02 NOTE — ED Provider Notes (Signed)
  Physical Exam  BP (!) 144/100   Pulse (!) 134   Temp 98 F (36.7 C)   Resp 17   Ht 6\' 4"  (1.93 m)   Wt 99.8 kg   SpO2 98%   BMI 26.78 kg/m   Physical Exam  Procedures  Procedures  ED Course / MDM   Clinical Course as of 09/02/22 1842  Tue Sep 02, 2022  1417 Troponin I (High Sensitivity)(!!): 150 [JL]  1417 B Natriuretic Peptide(!): 804.9 [JL]    Clinical Course User Index [JL] Ernie Avena, MD   Medical Decision Making Patient arrived to the ED at [facility] around 5 PM.  Patient is transferred from [facility] to get VQ scan and admission and cardiology consult.  6:43 PM I tried multiple times to page the nuclear medicine tech but they are no longer available.  VQ scan cannot be done tonight.  Patient's troponin is going from 1 50-1 69 and is already on heparin for possible ACS versus PE.  I talked to Dr. Shari Prows from cardiology who states that patient appears to be in rapid A-fib.  I started patient on Cardizem drip.  Cardiology will see patient as a consult and patient will be admitted to the medicine service.  Amount and/or Complexity of Data Reviewed Labs: ordered. Decision-making details documented in ED Course. Radiology: ordered and independent interpretation performed. Decision-making details documented in ED Course. ECG/medicine tests: ordered and independent interpretation performed. Decision-making details documented in ED Course.  Risk OTC drugs. Prescription drug management.          Charlynne Pander, MD 09/02/22 [phone]

## 2022-09-02 NOTE — ED Triage Notes (Signed)
Patient here POV from Home.  Endorses Fatigue for over a few days. Notes BP was low yesterday (90/50) and it improved with increased PO Intake. Noted Tachycardia.   No CP. SOB as well.   NAD Noted during Triage. A&Ox4. GCS 15. Ambulatory.

## 2022-09-02 NOTE — Assessment & Plan Note (Addendum)
-   Admit to step down on Cardizem drip       CHA2D-VASC score >2       Will start on  Heparin,                   Check TSH      Cycle cardiac enzymes      Obtain ECHO      Cardiology consult in AM Given sudden onset of hypotention/ tachy hx of cancer VQ scan ordered Not a candidate for CTa

## 2022-09-02 NOTE — Assessment & Plan Note (Signed)
Hold home meds given recent hypotension

## 2022-09-03 ENCOUNTER — Observation Stay (HOSPITAL_COMMUNITY): Payer: Medicare Other

## 2022-09-03 ENCOUNTER — Inpatient Hospital Stay: Payer: Medicare Other

## 2022-09-03 ENCOUNTER — Telehealth (HOSPITAL_COMMUNITY): Payer: Self-pay | Admitting: Pharmacy Technician

## 2022-09-03 ENCOUNTER — Inpatient Hospital Stay (HOSPITAL_COMMUNITY): Payer: Medicare Other

## 2022-09-03 ENCOUNTER — Other Ambulatory Visit (HOSPITAL_COMMUNITY): Payer: Self-pay

## 2022-09-03 ENCOUNTER — Inpatient Hospital Stay: Payer: Medicare Other | Admitting: Oncology

## 2022-09-03 DIAGNOSIS — G620 Drug-induced polyneuropathy: Secondary | ICD-10-CM | POA: Diagnosis present

## 2022-09-03 DIAGNOSIS — I252 Old myocardial infarction: Secondary | ICD-10-CM | POA: Diagnosis not present

## 2022-09-03 DIAGNOSIS — D6959 Other secondary thrombocytopenia: Secondary | ICD-10-CM | POA: Diagnosis present

## 2022-09-03 DIAGNOSIS — I10 Essential (primary) hypertension: Secondary | ICD-10-CM | POA: Diagnosis not present

## 2022-09-03 DIAGNOSIS — C785 Secondary malignant neoplasm of large intestine and rectum: Secondary | ICD-10-CM | POA: Diagnosis present

## 2022-09-03 DIAGNOSIS — I130 Hypertensive heart and chronic kidney disease with heart failure and stage 1 through stage 4 chronic kidney disease, or unspecified chronic kidney disease: Secondary | ICD-10-CM | POA: Diagnosis present

## 2022-09-03 DIAGNOSIS — I340 Nonrheumatic mitral (valve) insufficiency: Secondary | ICD-10-CM | POA: Diagnosis not present

## 2022-09-03 DIAGNOSIS — N289 Disorder of kidney and ureter, unspecified: Secondary | ICD-10-CM | POA: Diagnosis not present

## 2022-09-03 DIAGNOSIS — N184 Chronic kidney disease, stage 4 (severe): Secondary | ICD-10-CM

## 2022-09-03 DIAGNOSIS — E785 Hyperlipidemia, unspecified: Secondary | ICD-10-CM | POA: Diagnosis present

## 2022-09-03 DIAGNOSIS — R7989 Other specified abnormal findings of blood chemistry: Secondary | ICD-10-CM | POA: Diagnosis not present

## 2022-09-03 DIAGNOSIS — I509 Heart failure, unspecified: Secondary | ICD-10-CM

## 2022-09-03 DIAGNOSIS — I5033 Acute on chronic diastolic (congestive) heart failure: Secondary | ICD-10-CM | POA: Diagnosis present

## 2022-09-03 DIAGNOSIS — D6481 Anemia due to antineoplastic chemotherapy: Secondary | ICD-10-CM | POA: Diagnosis present

## 2022-09-03 DIAGNOSIS — I739 Peripheral vascular disease, unspecified: Secondary | ICD-10-CM | POA: Diagnosis not present

## 2022-09-03 DIAGNOSIS — C187 Malignant neoplasm of sigmoid colon: Secondary | ICD-10-CM | POA: Diagnosis present

## 2022-09-03 DIAGNOSIS — Z96652 Presence of left artificial knee joint: Secondary | ICD-10-CM | POA: Diagnosis present

## 2022-09-03 DIAGNOSIS — Z85038 Personal history of other malignant neoplasm of large intestine: Secondary | ICD-10-CM | POA: Diagnosis not present

## 2022-09-03 DIAGNOSIS — C786 Secondary malignant neoplasm of retroperitoneum and peritoneum: Secondary | ICD-10-CM | POA: Diagnosis present

## 2022-09-03 DIAGNOSIS — I4892 Unspecified atrial flutter: Secondary | ICD-10-CM | POA: Diagnosis present

## 2022-09-03 DIAGNOSIS — E872 Acidosis, unspecified: Secondary | ICD-10-CM | POA: Diagnosis present

## 2022-09-03 DIAGNOSIS — I447 Left bundle-branch block, unspecified: Secondary | ICD-10-CM | POA: Diagnosis present

## 2022-09-03 DIAGNOSIS — E860 Dehydration: Secondary | ICD-10-CM | POA: Diagnosis present

## 2022-09-03 DIAGNOSIS — R600 Localized edema: Secondary | ICD-10-CM | POA: Diagnosis not present

## 2022-09-03 DIAGNOSIS — R609 Edema, unspecified: Secondary | ICD-10-CM | POA: Diagnosis not present

## 2022-09-03 DIAGNOSIS — Z66 Do not resuscitate: Secondary | ICD-10-CM | POA: Diagnosis present

## 2022-09-03 DIAGNOSIS — I7121 Aneurysm of the ascending aorta, without rupture: Secondary | ICD-10-CM | POA: Diagnosis present

## 2022-09-03 DIAGNOSIS — T451X5A Adverse effect of antineoplastic and immunosuppressive drugs, initial encounter: Secondary | ICD-10-CM | POA: Diagnosis present

## 2022-09-03 DIAGNOSIS — I4891 Unspecified atrial fibrillation: Secondary | ICD-10-CM | POA: Diagnosis present

## 2022-09-03 DIAGNOSIS — C779 Secondary and unspecified malignant neoplasm of lymph node, unspecified: Secondary | ICD-10-CM | POA: Diagnosis present

## 2022-09-03 DIAGNOSIS — D649 Anemia, unspecified: Secondary | ICD-10-CM | POA: Diagnosis not present

## 2022-09-03 DIAGNOSIS — R0602 Shortness of breath: Secondary | ICD-10-CM | POA: Diagnosis present

## 2022-09-03 DIAGNOSIS — C189 Malignant neoplasm of colon, unspecified: Secondary | ICD-10-CM | POA: Diagnosis not present

## 2022-09-03 DIAGNOSIS — I2489 Other forms of acute ischemic heart disease: Secondary | ICD-10-CM | POA: Diagnosis present

## 2022-09-03 LAB — VITAMIN B12
Vitamin B-12: 220 pg/mL (ref 180–914)
Vitamin B-12: 235 pg/mL (ref 180–914)

## 2022-09-03 LAB — LACTIC ACID, PLASMA
Lactic Acid, Venous: 1.1 mmol/L (ref 0.5–1.9)
Lactic Acid, Venous: 1.1 mmol/L (ref 0.5–1.9)

## 2022-09-03 LAB — ECHOCARDIOGRAM COMPLETE
Area-P 1/2: 4.46 cm2
Calc EF: 61.5 %
Height: 76 in
S' Lateral: 3.3 cm
Single Plane A2C EF: 60 %
Single Plane A4C EF: 56.8 %
Weight: 3520 oz

## 2022-09-03 LAB — COMPREHENSIVE METABOLIC PANEL
ALT: 10 U/L (ref 0–44)
AST: 10 U/L — ABNORMAL LOW (ref 15–41)
Albumin: 2.7 g/dL — ABNORMAL LOW (ref 3.5–5.0)
Alkaline Phosphatase: 96 U/L (ref 38–126)
Anion gap: 11 (ref 5–15)
BUN: 29 mg/dL — ABNORMAL HIGH (ref 8–23)
CO2: 16 mmol/L — ABNORMAL LOW (ref 22–32)
Calcium: 8.3 mg/dL — ABNORMAL LOW (ref 8.9–10.3)
Chloride: 110 mmol/L (ref 98–111)
Creatinine, Ser: 2.26 mg/dL — ABNORMAL HIGH (ref 0.61–1.24)
GFR, Estimated: 30 mL/min — ABNORMAL LOW (ref >60)
Glucose, Bld: 124 mg/dL — ABNORMAL HIGH (ref 70–99)
Potassium: 4.4 mmol/L (ref 3.5–5.1)
Sodium: 137 mmol/L (ref 135–145)
Total Bilirubin: 0.5 mg/dL (ref 0.3–1.2)
Total Protein: 6 g/dL — ABNORMAL LOW (ref 6.5–8.1)

## 2022-09-03 LAB — CBC WITH DIFFERENTIAL/PLATELET
Abs Immature Granulocytes: 0.09 10*3/uL — ABNORMAL HIGH (ref 0.00–0.07)
Basophils Absolute: 0.1 10*3/uL (ref 0.0–0.1)
Basophils Relative: 1 %
Eosinophils Absolute: 0.1 10*3/uL (ref 0.0–0.5)
Eosinophils Relative: 1 %
HCT: 29.8 % — ABNORMAL LOW (ref 39.0–52.0)
Hemoglobin: 9.2 g/dL — ABNORMAL LOW (ref 13.0–17.0)
Immature Granulocytes: 1 %
Lymphocytes Relative: 15 %
Lymphs Abs: 1.4 10*3/uL (ref 0.7–4.0)
MCH: 26.8 pg (ref 26.0–34.0)
MCHC: 30.9 g/dL (ref 30.0–36.0)
MCV: 86.9 fL (ref 80.0–100.0)
Monocytes Absolute: 0.8 10*3/uL (ref 0.1–1.0)
Monocytes Relative: 9 %
Neutro Abs: 6.5 10*3/uL (ref 1.7–7.7)
Neutrophils Relative %: 73 %
Platelets: 218 10*3/uL (ref 150–400)
RBC: 3.43 MIL/uL — ABNORMAL LOW (ref 4.22–5.81)
RDW: 17 % — ABNORMAL HIGH (ref 11.5–15.5)
WBC: 9 10*3/uL (ref 4.0–10.5)
nRBC: 0 % (ref 0.0–0.2)

## 2022-09-03 LAB — HEPARIN LEVEL (UNFRACTIONATED): Heparin Unfractionated: 0.14 IU/mL — ABNORMAL LOW (ref 0.30–0.70)

## 2022-09-03 LAB — CBC
HCT: 30.8 % — ABNORMAL LOW (ref 39.0–52.0)
Hemoglobin: 9.7 g/dL — ABNORMAL LOW (ref 13.0–17.0)
MCH: 27.1 pg (ref 26.0–34.0)
MCHC: 31.5 g/dL (ref 30.0–36.0)
MCV: 86 fL (ref 80.0–100.0)
Platelets: 217 10*3/uL (ref 150–400)
RBC: 3.58 MIL/uL — ABNORMAL LOW (ref 4.22–5.81)
RDW: 16.9 % — ABNORMAL HIGH (ref 11.5–15.5)
WBC: 9.1 10*3/uL (ref 4.0–10.5)
nRBC: 0 % (ref 0.0–0.2)

## 2022-09-03 LAB — FOLATE: Folate: 7.4 ng/mL (ref >5.9)

## 2022-09-03 LAB — PHOSPHORUS: Phosphorus: 2.6 mg/dL (ref 2.5–4.6)

## 2022-09-03 LAB — MAGNESIUM: Magnesium: 2.2 mg/dL (ref 1.7–2.4)

## 2022-09-03 LAB — TROPONIN I (HIGH SENSITIVITY): Troponin I (High Sensitivity): 186 ng/L (ref <18)

## 2022-09-03 LAB — PREALBUMIN: Prealbumin: 15 mg/dL — ABNORMAL LOW (ref 18–38)

## 2022-09-03 MED ORDER — METOPROLOL TARTRATE 25 MG PO TABS
25.0000 mg | ORAL_TABLET | Freq: Two times a day (BID) | ORAL | Status: DC
  Administered 2022-09-03 – 2022-09-04 (×3): 25 mg via ORAL
  Filled 2022-09-03 (×3): qty 1

## 2022-09-03 MED ORDER — FUROSEMIDE 10 MG/ML IJ SOLN
40.0000 mg | Freq: Once | INTRAMUSCULAR | Status: AC
  Administered 2022-09-03: 40 mg via INTRAVENOUS
  Filled 2022-09-03: qty 4

## 2022-09-03 MED ORDER — HEPARIN BOLUS VIA INFUSION
4000.0000 [IU] | Freq: Once | INTRAVENOUS | Status: AC
  Administered 2022-09-03: 4000 [IU] via INTRAVENOUS
  Filled 2022-09-03: qty 4000

## 2022-09-03 MED ORDER — APIXABAN 5 MG PO TABS
5.0000 mg | ORAL_TABLET | Freq: Two times a day (BID) | ORAL | Status: DC
  Administered 2022-09-03 – 2022-09-04 (×3): 5 mg via ORAL
  Filled 2022-09-03 (×3): qty 1

## 2022-09-03 MED ORDER — TECHNETIUM TO 99M ALBUMIN AGGREGATED
4.1000 | Freq: Once | INTRAVENOUS | Status: AC | PRN
  Administered 2022-09-03: 4.1 via INTRAVENOUS

## 2022-09-03 MED ORDER — SODIUM CHLORIDE 0.9% FLUSH
10.0000 mL | Freq: Two times a day (BID) | INTRAVENOUS | Status: DC
  Administered 2022-09-03: 10 mL

## 2022-09-03 MED ORDER — CHLORHEXIDINE GLUCONATE CLOTH 2 % EX PADS
6.0000 | MEDICATED_PAD | Freq: Every day | CUTANEOUS | Status: DC
  Administered 2022-09-03 – 2022-09-04 (×2): 6 via TOPICAL

## 2022-09-03 NOTE — Telephone Encounter (Signed)
Pharmacy Patient Advocate Encounter  Insurance verification completed.    The patient is insured through [employer] Part D   The patient is currently admitted and ran test claims for the following: Eliquis.  Copays and coinsurance results were relayed to Inpatient clinical team. 

## 2022-09-03 NOTE — Progress Notes (Signed)
Pt seen as a courtesy as I am very familiar with him from outpatient follow up.  He started complaining of SOB, DOE, and fatigue as well as elevated heart rates in the 130's.  ECG with atrial flutter and LBBB.  He was started on cardizem drip and iv heparin with plans to transition to Eliquis and for DCCV during this hospitalization.  Thankfully his renal function has remained stable and is at his baseline.  I will continue to see him daily and review his records.  I will put in an official consult if he develops AKI during his hospitalization.

## 2022-09-03 NOTE — Evaluation (Signed)
Occupational Therapy Evaluation Patient Details Name: Louis [name] MRN: [id number] DOB: [birth year] Today's Date: 09/03/2022   History of Present Illness Pt presenting 4/30 with fatigue over the past few days; found to have low BP, tachycardia. On heparin. 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   Clinical Impression   PTA, pt living with his wife and independent. Upon eval, pt limited by increased heart rate and decreased activity tolerance. Pt performing ADL with mod I. Able to groom, perform LB ADL, and retrieve item from floor during session. Reviewed energy conservation techniques during session and handout provided afterward. Pt confident implementing in his home setting and cognition WFL for tasks. Reports he does get brain fog from chemo but has navigated well and wife can assist if needed. Do not anticipate OT follow up needs. Thank you for this order. All education provided. OT to sign off.      Recommendations for follow up therapy are one component of a multi-disciplinary discharge planning process, led by the attending physician.  Recommendations may be updated based on patient status, additional functional criteria and insurance authorization.   Assistance Recommended at Discharge Intermittent Supervision/Assistance  Patient can return home with the following Assistance with cooking/housework;Assist for transportation;Help with stairs or ramp for entrance    Functional Status Assessment  Patient has had a recent decline in their functional status and demonstrates the ability to make significant improvements in function in a reasonable and predictable amount of time.  Equipment Recommendations  None recommended by OT    Recommendations for Other Services       Precautions / Restrictions Precautions Precautions: Fall Precaution Comments: watch HR Restrictions Weight Bearing  Restrictions: No      Mobility Bed Mobility Overal bed mobility: Modified Independent             General bed mobility comments: use of bed features    Transfers Overall transfer level: Needs assistance Equipment used: Rolling walker (2 wheels) Transfers: Sit to/from Stand Sit to Stand: Supervision           General transfer comment: sup from EOB for safety. pt completed from toilet and sat in recliner EOS.      Balance Overall balance assessment: Needs assistance Sitting-balance support: Feet supported Sitting balance-Leahy Scale: Normal     Standing balance support: Bilateral upper extremity supported, During functional activity, Reliant on assistive device for balance Standing balance-Leahy Scale: Fair                             ADL either performed or assessed with clinical judgement   ADL Overall ADL's : Modified independent                                       General ADL Comments: Performing grooming, LB ADL, and retrieving items from floor mod I     Vision Baseline Vision/History: 1 Wears glasses Ability to See in Adequate Light: 0 Adequate Patient Visual Report: No change from baseline Vision Assessment?: No apparent visual deficits     Perception Perception Perception Tested?: No   Praxis Praxis Praxis tested?: Within functional limits    Pertinent Vitals/Pain Pain Assessment Pain Assessment: No/denies pain     Hand Dominance Right   Extremity/Trunk Assessment Upper Extremity Assessment Upper Extremity Assessment:  RUE deficits/detail;LUE deficits/detail RUE Deficits / Details: decr ROM ~90 degrees shoulder flexion LUE Deficits / Details: decr ROM ~90 degrees shoulder flexion   Lower Extremity Assessment Lower Extremity Assessment: Defer to PT evaluation   Cervical / Trunk Assessment Cervical / Trunk Assessment: Normal   Communication Communication Communication: No difficulties   Cognition  Arousal/Alertness: Awake/alert Behavior During Therapy: WFL for tasks assessed/performed Overall Cognitive Status: Within Functional Limits for tasks assessed                                       General Comments  HR max 122    Exercises     Shoulder Instructions      Home Living Family/patient expects to be discharged to:: Private residence Living Arrangements: Spouse/significant other Available Help at Discharge: Family Type of Home: House Home Access: Stairs to enter Secretary/administrator of Steps: 6 Entrance Stairs-Rails: Can reach both Home Layout: Multi-level;Able to live on main level with bedroom/bathroom;Full bath on main level     Bathroom Shower/Tub: Producer, television/film/video: Handicapped height Bathroom Accessibility: Yes   Home Equipment: Agricultural consultant (2 wheels);Cane - single point;BSC/3in1;Shower seat   Additional Comments: wheelchair accesible; no lip on shower 0 entry      Prior Functioning/Environment Prior Level of Function : Independent/Modified Independent             Mobility Comments: uses RW at night when going to bathroom, uses SPC during day as needed ADLs Comments: independent        OT Problem List: Decreased strength;Decreased activity tolerance;Impaired balance (sitting and/or standing)      OT Treatment/Interventions:      OT Goals(Current goals can be found in the care plan section) Acute Rehab OT Goals Patient Stated Goal: go home OT Goal Formulation: With patient  OT Frequency:      Co-evaluation              AM-PAC OT "6 Clicks" Daily Activity     Outcome Measure Help from another person eating meals?: None Help from another person taking care of personal grooming?: None Help from another person toileting, which includes using toliet, bedpan, or urinal?: None Help from another person bathing (including washing, rinsing, drying)?: None Help from another person to put on and taking off  regular upper body clothing?: None Help from another person to put on and taking off regular lower body clothing?: None 6 Click Score: 24   End of Session Equipment Utilized During Treatment: Gait belt;Rolling walker (2 wheels) Nurse Communication: Mobility status  Activity Tolerance: Patient tolerated treatment well Patient left: in chair;with call bell/phone within reach  OT Visit Diagnosis: Unsteadiness on feet (R26.81);Muscle weakness (generalized) (M62.81)                Time: 4098-1191 OT Time Calculation (min): 19 min Charges:  OT General Charges $OT Visit: 1 Visit OT Evaluation $OT Eval Low Complexity: 1 Low  Tyler Deis, OTR/L [facility] Acute Rehabilitation Office: [phone]   Myrla Halsted 09/03/2022, 5:23 PM

## 2022-09-03 NOTE — Progress Notes (Signed)
Rounding Note    Patient Name: Louis [name] Date of Encounter: 09/03/2022  [facility] Cardiologist: Garwin Brothers, MD   Subjective   No acute overnight events. Patient remains in atrial flutter. Rates in the 70s to 80s at rest but will increase to the 120s easily even when talking and small movements in bed. He denies any shortness of breath at the moment but states he usually just has it with exertion. No chest pain or palpitations.  Inpatient Medications    Scheduled Meds:  aspirin  325 mg Oral Daily   Chlorhexidine Gluconate Cloth  6 each Topical Daily   sodium chloride flush  10-40 mL Intracatheter Q12H   Continuous Infusions:  diltiazem (CARDIZEM) infusion 15 mg/hr (09/03/22 0526)   heparin 1,900 Units/hr (09/03/22 0526)   PRN Meds: acetaminophen **OR** acetaminophen, HYDROcodone-acetaminophen, ondansetron **OR** ondansetron (ZOFRAN) IV   Vital Signs    Vitals:   09/03/22 0301 09/03/22 0409 09/03/22 0500 09/03/22 0600  BP: (!) 140/83 (!) 148/73 129/75 133/75  Pulse:  (!) 111    Resp:  18    Temp:  98 F (36.7 C)    TempSrc:  Oral    SpO2: 95% 97% 93% 96%  Weight:      Height:        Intake/Output Summary (Last 24 hours) at 09/03/2022 0652 Last data filed at 09/03/2022 0526 Gross per 24 hour  Intake 2109.6 ml  Output --  Net 2109.6 ml      09/02/2022   11:42 AM 08/12/2022   10:38 AM 07/23/2022    9:36 AM  Last 3 Weights  Weight (lbs) 220 lb 220 lb 9.6 oz 221 lb  Weight (kg) 99.791 kg 100.064 kg 100.245 kg      Telemetry    Atrial flutter with rates in the 70s to 80s at rest but quickly increases to the 120s with minimal exertion/ talking. - Personally Reviewed  ECG    No new ECG tracing today. - Personally Reviewed  Physical Exam   GEN: Caucasian male resting comfortably in no acute distress.   Neck: No JVD. Cardiac: Tachycardic with irregular rhythm. No murmurs, rubs, or gallops. Radial pulses 2+ and equal  bilaterally. Respiratory: No increased work of breathing. Very faint crackles in bilateral bases. No wheezes or rhonchi. GI: Soft, non-distended, and non-tender. MS: No to trace lower extremity edema bilaterally. No deformity.  Skin: Warm and dry. Port-A-Cath present. Neuro:  No focal deficits. Psych: Normal affect. Responds appropriately.  Labs    High Sensitivity Troponin:   Recent Labs  Lab 09/02/22 1144 09/02/22 1438 09/02/22 2210  TROPONINIHS 150* 169* 165*     Chemistry Recent Labs  Lab 09/02/22 1144 09/02/22 2210  NA 136 136  K 4.9 4.4  CL 111 112*  CO2 15* 17*  GLUCOSE 97 130*  BUN 38* 33*  CREATININE 2.27* 2.42*  CALCIUM 8.8* 8.4*  MG  --  1.3*  PROT 6.5 6.0*  ALBUMIN 3.5 2.7*  AST 7* 9*  ALT 5 9  ALKPHOS 106 93  BILITOT 0.4 0.6  GFRNONAA 29* 27*  ANIONGAP 10 7    Lipids No results for input(s): "CHOL", "TRIG", "HDL", "LABVLDL", "LDLCALC", "CHOLHDL" in the last 168 hours.  Hematology Recent Labs  Lab 09/02/22 1144 09/02/22 2210  WBC 8.1 9.0  RBC 3.67* 3.43*  3.45*  HGB 9.9* 9.2*  HCT 32.1* 29.8*  MCV 87.5 86.9  MCH 27.0 26.8  MCHC 30.8 30.9  RDW 17.0*  17.0*  PLT 206 218   Thyroid  Recent Labs  Lab 09/02/22 1144 09/02/22 1248  TSH  --  2.009  FREET4 0.80  --     BNP Recent Labs  Lab 09/02/22 1231  BNP 804.9*    DDimer  Recent Labs  Lab 09/02/22 2210  DDIMER 0.62*     Radiology    DG Chest Portable 1 View  Result Date: 09/02/2022 CLINICAL DATA:  Shortness of breath EXAM: PORTABLE CHEST 1 VIEW COMPARISON:  None Available. FINDINGS: No pleural effusion. No pneumothorax. No focal airspace opacity. Cardiomegaly. No radiographically apparent displaced rib fractures. Visualized upper abdomen is unremarkable. IMPRESSION: Cardiomegaly.  No pleural effusion or significant pulmonary edema. Electronically Signed   By: Lorenza Cambridge M.D.   On: 09/02/2022 12:16    Cardiac Studies   Echocardiogram 01/19/2018: Study Conclusions: - Left  ventricle: The cavity size was normal. Wall thickness was    increased in a pattern of moderate LVH. The estimated ejection    fraction was 55%. Septal-lateral dyssynchrony consistent with    LBBB. Doppler parameters are consistent with abnormal left    ventricular relaxation (grade 1 diastolic dysfunction).  - Aortic valve: There was no stenosis.  - Aorta: Dilated aortic root and ascending aorta. Ascending aorta    dimension: 45 mm. Aortic root dimension: 41 mm (ED).  - Mitral valve: There was no significant regurgitation.  - Right ventricle: The cavity size was normal. Systolic function    was normal.  - Right atrium: The atrium was mildly dilated.  - Tricuspid valve: Peak RV-RA gradient (S): 18 mm Hg.  - Pulmonary arteries: PA peak pressure: 21 mm Hg (S).  - Inferior vena cava: The vessel was normal in size. The    respirophasic diameter changes were in the normal range (= 50%),    consistent with normal central venous pressure.   Impressions:  - Normal LV size with moderate LV hypertrophy. EF 55% with    septal-lateral dyssynchrony consistent with LBBB. Normal RV size    and systolic function. No significant valvular abnormalities.  _______________  Myoview 08/29/2019: The left ventricular ejection fraction is mildly decreased (45-54%). Nuclear stress EF: 53%. Contractile performance compatible with left bundle branch block There was no ST segment deviation noted during stress. Defect 1: There is a small defect of mild severity present in the apex location. Possible artifact This is a low risk study. There are no areas of significant ischemia identified.   Overall, no significant change from prior nuclear stress test in 2019.   Patient Profile     77 y.o. male with a history of chronic LBBB, ascending thoracic aortic aneurysm, hypertension, hyperlipidemia,  CKD stage IIIb, and stage IV colon cancer who admitted on 09/02/2022 for new onset atrial flutter after presenting with  worsening shortness of breath and elevated heart rate.  Assessment & Plan    New Onset Atrial Flutter with RVR Presented with worsening shortness of breath and fatigue and was found to be in new onset atrial flutter with RVR. Rates initially in the 130s. Magnesium was low at 1.3  (repleted) but potassium and TSH okay. He was started on IV Diltiazem in the ED. - Remains in atrial flutter this morning with rates in the 70s to 80s at rest but quickly increases to the 120s with minimal movement/ talking. - Echo has been ordered and is pending. - Continue IV Diltiazem.  - Will start Lopressor 25mg  twice daily. - CHA2DS2-VASc = 3 (HTN and  age x2). He was started on IV Heparin.  Will switch to Eliquis 5mg  twice daily. - Suspect patient will need DCCV if he does not come out of this with rate control agents. If EF is down on TTE, will likely proceed with TEE/DCCV this admission. If EF is normal and we can rate control him, can plan for outpatient DCCV after 3 weeks of uninterrupted anticoagulation.   Dyspnea on Exertion Patient presented with worsening shortness of breath over the last couple of weeks. BNP elevated at 804. Chest x-ray showed cardiomegaly but no edema or pleural effusions. D-dimer was negative when adjusted for age; however, V/Q scan has been ordered by primary team to rule out PE given underlying malignancy.  - Very faint crackles noted on exam but otherwise euvolemic. - Echo pending. - V/Q scan pending. - Suspect dyspnea is secondary to atrial flutter with RVR but will wait for the above studies. Given very faint crackles and elevated BNP, will give one dose of IV Lasix 40mg  and see how he responds.  Elevated Troponin Chronic LBBB High-sensitivity troponin mildly elevated and flat at 150 >> 169 >> 165. Myoview in 2021 showed a fixed defect but otherwise normal perfusion. - No chest pain.  - Echo pending. - Suspect demand ischemia in setting of atrial flutter. Further  recommendations pending Echo results but do not suspect any ischemic evaluation will be needed.  Hypertension BP mildly elevated on admission but improved this morning. - Continue IV Diltiazem and start Lopressor as above.  CKD Stage IIIb Creatinine 2.27 on admission. Baseline around 2.2 to 2.4.  - Stable at 2.26 today. - Continue to monitor closely.  Hypomagnesemia Magnesium was 1.3 on admission. Repleted.  - Magnesium improved to 2.2 today.  Otherwise, per primary team: - Stage IV colon cancer - Chronic anemia - Metabolic acidosis - Right leg edema: dopplers pending to rule out DVT - Hyperlipdiemia    For questions or updates, please contact [facility] Please consult [email or web address] for contact info under        Signed, Corrin Parker, PA-C  09/03/2022, 6:52 AM

## 2022-09-03 NOTE — Progress Notes (Signed)
   09/02/22 2046  Assess: MEWS Score  Temp 98.2 F (36.8 C)  BP (!) 150/105  MAP (mmHg) 116  Pulse Rate (!) 146  ECG Heart Rate (!) 141  Resp 19  SpO2 97 %  O2 Device Room Air  Assess: MEWS Score  MEWS Temp 0  MEWS Systolic 0  MEWS Pulse 3  MEWS RR 0  MEWS LOC 0  MEWS Score 3  MEWS Score Color Yellow  Assess: if the MEWS score is Yellow or Red  Were vital signs taken at a resting state? Yes  Focused Assessment No change from prior assessment (RED/YELLOW MEWS in ED)  Does the patient meet 2 or more of the SIRS criteria? Yes  Does the patient have a confirmed or suspected source of infection? No  MEWS guidelines implemented  Yes, yellow  Treat  MEWS Interventions Considered administering scheduled or prn medications/treatments as ordered  Take Vital Signs  Increase Vital Sign Frequency  Yellow: Q2hr x1, continue Q4hrs until patient remains green for 12hrs  Escalate  MEWS: Escalate Yellow: Discuss with charge nurse and consider notifying provider and/or RRT  Notify: Charge Nurse/RN  Name of Charge Nurse/RN Notified Lawyer  Provider Notification  Provider Name/Title C. Margo Aye  Date Provider Notified 09/03/22  Time Provider Notified 2300  Method of Notification  (Secure chat)  Notification Reason Other (Comment) (HR sustaining 130-140's)  Provider response Other (Comment) (Magnesium gtts was ordered)  Date of Provider Response 09/02/22  Time of Provider Response 2359  Assess: SIRS CRITERIA  SIRS Temperature  0  SIRS Pulse 1  SIRS Respirations  0  SIRS WBC 0  SIRS Score Sum  1

## 2022-09-03 NOTE — Progress Notes (Signed)
ANTICOAGULATION CONSULT NOTE - Follow Up Consult  Pharmacy Consult for heparin Indication: atrial fibrillation and r/o pulmonary embolus  Labs: Recent Labs    09/02/22 1144 09/02/22 1438 09/02/22 2210 09/02/22 2332  HGB 9.9*  --  9.2*  --   HCT 32.1*  --  29.8*  --   PLT 206  --  218  --   HEPARINUNFRC  --   --   --  0.14*  CREATININE 2.27*  --  2.42*  --   CKTOTAL  --   --  31*  --   TROPONINIHS 150* 169* 165*  --     Assessment: 76yo male subtherapeutic on heparin with initial dosing for Afib w/ RVR and possible PE (awaiting VQ scan); no infusion issues or signs of bleeding per RN.  Goal of Therapy:  Heparin level 0.3-0.7 units/ml   Plan:  Heparin bolus of 4000 units. Increase heparin infusion by 3 units/kg/hr to 1900 units/hr. Check level in 8 hours.   Vernard Gambles, PharmD, BCPS 09/03/2022 1:28 AM

## 2022-09-03 NOTE — Plan of Care (Signed)

## 2022-09-03 NOTE — Progress Notes (Signed)
Echocardiogram 2D Echocardiogram has been performed.  Toni Amend 09/03/2022, 3:09 PM

## 2022-09-03 NOTE — Care Management (Signed)
  Transition of Care [facility]) Screening Note   Patient Details  Name: Louis [name] Date of Birth: [birth year]   Transition of Care [facility]) CM/SW Contact:    Gala Lewandowsky, RN Phone Number: 09/03/2022, 8:55 AM    Transition of Care Department [facility]) has reviewed the patient and no TOC needs have been identified at this time. Patient presented for shortness of breath and found to be in Atrial Fib with RVR. Benefits check submitted for Eliquis. We will continue to monitor patient advancement through interdisciplinary progression rounds. If new patient transition needs arise, please place a TOC consult.

## 2022-09-03 NOTE — Progress Notes (Signed)
  Progress Note   Patient: Louis [name] ZOX:[id number] DOB: 01/28/1946 DOA: 09/02/2022     0 DOS: the patient was seen and examined on 09/03/2022 at 11:05AM      Brief hospital course: Mr. Prestage is a 77 y.o. 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     Assessment and Plan: New onset atrial flutter CHA2DS2-Vasc 3 for age, HTN.   - Continue anticoagulation, switch to Eliquis - Continue diltiazem drip - Continue metoprolol - Follow echo   Acute on chronic diastolic congestive heart failure Patient with shortness of breath, crackles on exam in the setting of atrial flutter. - IV Lasix per cardiology  Metastatic colon cancer  Chronic kidney disease stage IV CKD stage IIIb ruled out, Cr stable relative to baseline 2.2-2.5  Essential hypertension Hyperlipidemia Not on statin - Continue metoprolol, diltiazem - Hold lisinopril, amlodipine  Demand ischemia Troponin elevated in setting of new atrial flutter, some chest tightness, improved with rate control.  Hypomagnesemia - Supplement Mag  Anemia due to chemotherapy Hgb stable  Leg swelling - Follow-up LE [place]         Subjective: Patient is feeling better, he is mild generalized weakness, but no chest pain anymore, no fever, no confusion.  No nursing concerns     Physical Exam: BP 133/75   Pulse 77   Temp 98 F (36.7 C) (Oral)   Resp 18   Ht 6\' 4"  (1.93 m)   Wt 99.8 kg   SpO2 96%   BMI 26.78 kg/m   Elderly adult male, lying in bed, no acute distress Tachycardic, irregular, soft systolic murmur, no peripheral edema Respiratory rate normal, lungs with crackles at bilateral bases, no wheezing Attention normal, affect appropriate, judgment and insight appear normal    Data Reviewed: Telemetry shows atrial fibrillation CBC shows mild anemia TSH and free T4 normal Creatinine stable at 2.4 Troponin 160s and  flat BNP 804 Chest x-ray with cardiomegaly  Family Communication: Wife at the bedside    Disposition: Status is: Inpatient Patient presented with atrial fibrillation which is Symptomatic congestive heart failure, myocardial ischemia, and is being treated with IV Lasix, IV diltiazem requiring continuous cardiac monitoring        Author: Alberteen Sam, MD 09/03/2022 2:26 PM  For on call review [email or web address].

## 2022-09-03 NOTE — Hospital Course (Signed)
Louis [name] is a 77 y.o. 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

## 2022-09-03 NOTE — TOC Benefit Eligibility Note (Signed)
Patient Product/process development scientist completed.    The patient is currently admitted and upon discharge could be taking Eliquis 5 mg.  The current 30 day co-pay is $520.82 due to a deductible.   The patient is insured through [facility] Part D   This test claim was processed through [facility]- copay amounts may vary at other pharmacies due to pharmacy/plan contracts, or as the patient moves through the different stages of their insurance plan.  Roland Earl, CPHT Pharmacy Patient Advocate Specialist [facility] Patient Advocate Team Direct Number: [phone]  Fax: [phone]

## 2022-09-03 NOTE — Progress Notes (Signed)
Date and time results received: 09/02/2022 2330 (use smartphrase ".now" to insert current time)  Test: Troponin Critical Value: 165  Name of Provider Notified: Delma Post   Orders Received? Or Actions Taken?:  No new orders

## 2022-09-03 NOTE — Progress Notes (Signed)
Initial Nutrition Assessment   REASON FOR ASSESSMENT:   Consult Assessment of nutrition requirement/status  ASSESSMENT:   77 y.o. 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 He reports that it was good. He reports a good appetite per usual. Patient denies chewing/swallowing issues. No N/V/D. He reports constipation due to taking iron pill.   He denies weight loss  He declined ONS and MVI.   RD to sign off at this time    Leodis Rains, RDN, LDN  Clinical Nutrition

## 2022-09-03 NOTE — Evaluation (Signed)
Physical Therapy Evaluation Patient Details Name: Louis [name] MRN: [id number] DOB: 11-Jun-1945 Today's Date: 09/03/2022  History of Present Illness  Pt presenting 4/30 with fatigue over the past few days; found to have low BP, tachycardia. On heparin. 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   Clinical Impression  Louis [name] is 77 y.o. male admitted with above HPI and diagnosis. Patient is currently limited by functional impairments below (see PT problem list). Patient lives with spouse and is independent at baseline. Currently he is mobilizing at supervision level with RW for transfers and gait, pt limited by tachycardia with activity, max of 138 bpm while ambulating to bathroom. Patient will benefit from continued skilled PT interventions to address impairments and progress independence with mobility. Acute PT will follow and progress as able.        Recommendations for follow up therapy are one component of a multi-disciplinary discharge planning process, led by the attending physician.  Recommendations may be updated based on patient status, additional functional criteria and insurance authorization.  Follow Up Recommendations       Assistance Recommended at Discharge PRN  Patient can return home with the following       Equipment Recommendations None recommended by PT  Recommendations for Other Services       Functional Status Assessment Patient has had a recent decline in their functional status and demonstrates the ability to make significant improvements in function in a reasonable and predictable amount of time.     Precautions / Restrictions Precautions Precautions: Fall Precaution Comments: watch HR Restrictions Weight Bearing Restrictions: No      Mobility  Bed Mobility Overal bed mobility: Modified Independent             General bed mobility comments: use of  bed features    Transfers Overall transfer level: Needs assistance Equipment used: Rolling walker (2 wheels) Transfers: Sit to/from Stand Sit to Stand: Supervision           General transfer comment: sup from EOB for safety. pt completed from toilet and sat in recliner EOS.    Ambulation/Gait Ambulation/Gait assistance: Supervision Gait Distance (Feet): 35 Feet Assistive device: Rolling walker (2 wheels) Gait Pattern/deviations: Step-through pattern, Decreased stride length, Trunk flexed Gait velocity: decr     General Gait Details: supervision for safety, assist to manage IV pole. pt wiht flexed posture and low step clearance but no buckling or LOB noted. HR in 130's with gait with max of 138 bpm while mobilizing to bathroom.  Stairs            Wheelchair Mobility    Modified Rankin (Stroke Patients Only)       Balance Overall balance assessment: Needs assistance Sitting-balance support: Feet supported Sitting balance-Leahy Scale: Normal     Standing balance support: Bilateral upper extremity supported, During functional activity, Reliant on assistive device for balance Standing balance-Leahy Scale: Fair                               Pertinent Vitals/Pain Pain Assessment Pain Assessment: No/denies pain    Home Living Family/patient expects to be discharged to:: Private residence Living Arrangements: Spouse/significant other Available Help at Discharge: Family Type of Home: House Home Access: Stairs to enter Entrance Stairs-Rails: Can reach both Entrance Stairs-Number of Steps: 6   Home Layout: Multi-level;Able to live on main level  with bedroom/bathroom;Full bath on main level Home Equipment: Rolling Walker (2 wheels);Cane - single point;BSC/3in1;Shower seat Additional Comments: wheelchair accesible    Prior Function               Mobility Comments: uses RW at night when going to bathroom, uses SPC during day as needed ADLs  Comments: independent     Hand Dominance   Dominant Hand: Right    Extremity/Trunk Assessment   Upper Extremity Assessment Upper Extremity Assessment: Overall WFL for tasks assessed    Lower Extremity Assessment Lower Extremity Assessment: Overall WFL for tasks assessed    Cervical / Trunk Assessment Cervical / Trunk Assessment: Normal  Communication      Cognition Arousal/Alertness: Awake/alert Behavior During Therapy: WFL for tasks assessed/performed Overall Cognitive Status: Within Functional Limits for tasks assessed                                          General Comments      Exercises     Assessment/Plan    PT Assessment Patient needs continued PT services  PT Problem List Decreased activity tolerance;Decreased mobility;Decreased knowledge of use of DME;Cardiopulmonary status limiting activity       PT Treatment Interventions DME instruction;Gait training;Stair training;Functional mobility training;Therapeutic activities;Therapeutic exercise;Balance training;Patient/family education    PT Goals (Current goals can be found in the Care Plan section)  Acute Rehab PT Goals Patient Stated Goal: get SOB and HR under control PT Goal Formulation: With patient Time For Goal Achievement: 09/17/22 Potential to Achieve Goals: Good    Frequency Min 1X/week     Co-evaluation               AM-PAC PT "6 Clicks" Mobility  Outcome Measure Help needed turning from your back to your side while in a flat bed without using bedrails?: None Help needed moving from lying on your back to sitting on the side of a flat bed without using bedrails?: None Help needed moving to and from a bed to a chair (including a wheelchair)?: A Little Help needed standing up from a chair using your arms (e.g., wheelchair or bedside chair)?: A Little Help needed to walk in hospital room?: A Little Help needed climbing 3-5 steps with a railing? : A Lot 6 Click Score:  19    End of Session Equipment Utilized During Treatment: Gait belt Activity Tolerance: Patient tolerated treatment well Patient left: in chair;with call bell/phone within reach Nurse Communication: Mobility status PT Visit Diagnosis: Difficulty in walking, not elsewhere classified (R26.2)    Time: 4098-1191 PT Time Calculation (min) (ACUTE ONLY): 22 min   Charges:   PT Evaluation $PT Eval Low Complexity: 1 Low          Wynn Maudlin, DPT Acute Rehabilitation Services Office [phone]  09/03/22 2:35 PM

## 2022-09-04 ENCOUNTER — Encounter (HOSPITAL_COMMUNITY): Payer: Self-pay | Admitting: Family Medicine

## 2022-09-04 ENCOUNTER — Inpatient Hospital Stay (HOSPITAL_COMMUNITY): Payer: Medicare Other | Admitting: Anesthesiology

## 2022-09-04 ENCOUNTER — Inpatient Hospital Stay (HOSPITAL_COMMUNITY)
Admit: 2022-09-04 | Discharge: 2022-09-04 | Disposition: A | Discharge status: Home / Self Care | Payer: Medicare Other | Attending: Student | Admitting: Student

## 2022-09-04 ENCOUNTER — Encounter: Payer: Self-pay | Admitting: Oncology

## 2022-09-04 ENCOUNTER — Inpatient Hospital Stay (HOSPITAL_COMMUNITY): Payer: Medicare Other

## 2022-09-04 ENCOUNTER — Encounter (HOSPITAL_COMMUNITY)
Admission: EM | Disposition: A | Discharge status: Home / Self Care | Payer: Self-pay | Source: Home / Self Care | Attending: Family Medicine

## 2022-09-04 ENCOUNTER — Other Ambulatory Visit (HOSPITAL_COMMUNITY): Payer: Self-pay

## 2022-09-04 DIAGNOSIS — R609 Edema, unspecified: Secondary | ICD-10-CM | POA: Diagnosis not present

## 2022-09-04 DIAGNOSIS — R7989 Other specified abnormal findings of blood chemistry: Secondary | ICD-10-CM | POA: Diagnosis not present

## 2022-09-04 DIAGNOSIS — I340 Nonrheumatic mitral (valve) insufficiency: Secondary | ICD-10-CM | POA: Diagnosis not present

## 2022-09-04 DIAGNOSIS — I4892 Unspecified atrial flutter: Secondary | ICD-10-CM

## 2022-09-04 DIAGNOSIS — I4891 Unspecified atrial fibrillation: Secondary | ICD-10-CM | POA: Diagnosis not present

## 2022-09-04 DIAGNOSIS — D649 Anemia, unspecified: Secondary | ICD-10-CM | POA: Diagnosis not present

## 2022-09-04 DIAGNOSIS — I10 Essential (primary) hypertension: Secondary | ICD-10-CM | POA: Diagnosis not present

## 2022-09-04 DIAGNOSIS — C189 Malignant neoplasm of colon, unspecified: Secondary | ICD-10-CM | POA: Diagnosis not present

## 2022-09-04 DIAGNOSIS — I509 Heart failure, unspecified: Secondary | ICD-10-CM | POA: Diagnosis not present

## 2022-09-04 HISTORY — PX: TEE WITHOUT CARDIOVERSION: SHX5443

## 2022-09-04 HISTORY — PX: CARDIOVERSION: SHX1299

## 2022-09-04 LAB — COMPREHENSIVE METABOLIC PANEL
ALT: 11 U/L (ref 0–44)
AST: 10 U/L — ABNORMAL LOW (ref 15–41)
Albumin: 2.5 g/dL — ABNORMAL LOW (ref 3.5–5.0)
Alkaline Phosphatase: 85 U/L (ref 38–126)
Anion gap: 8 (ref 5–15)
BUN: 30 mg/dL — ABNORMAL HIGH (ref 8–23)
CO2: 18 mmol/L — ABNORMAL LOW (ref 22–32)
Calcium: 8.3 mg/dL — ABNORMAL LOW (ref 8.9–10.3)
Chloride: 110 mmol/L (ref 98–111)
Creatinine, Ser: 2.51 mg/dL — ABNORMAL HIGH (ref 0.61–1.24)
GFR, Estimated: 26 mL/min — ABNORMAL LOW (ref >60)
Glucose, Bld: 121 mg/dL — ABNORMAL HIGH (ref 70–99)
Potassium: 4.4 mmol/L (ref 3.5–5.1)
Sodium: 136 mmol/L (ref 135–145)
Total Bilirubin: 0.4 mg/dL (ref 0.3–1.2)
Total Protein: 5.9 g/dL — ABNORMAL LOW (ref 6.5–8.1)

## 2022-09-04 LAB — ECHO TEE

## 2022-09-04 SURGERY — ECHOCARDIOGRAM, TRANSESOPHAGEAL
Anesthesia: General

## 2022-09-04 MED ORDER — HEPARIN SOD (PORK) LOCK FLUSH 100 UNIT/ML IV SOLN
500.0000 [IU] | INTRAVENOUS | Status: AC | PRN
  Administered 2022-09-04: 500 [IU]

## 2022-09-04 MED ORDER — PHENYLEPHRINE 80 MCG/ML (10ML) SYRINGE FOR IV PUSH (FOR BLOOD PRESSURE SUPPORT)
PREFILLED_SYRINGE | INTRAVENOUS | Status: DC | PRN
  Administered 2022-09-04: 80 ug via INTRAVENOUS

## 2022-09-04 MED ORDER — SODIUM CHLORIDE 0.9 % IV SOLN: INTRAVENOUS | Status: DC

## 2022-09-04 MED ORDER — DILTIAZEM HCL ER COATED BEADS 120 MG PO CP24: 120.0000 mg | ORAL_CAPSULE | Freq: Every day | ORAL | Status: DC

## 2022-09-04 MED ORDER — METOPROLOL TARTRATE 25 MG PO TABS
25.0000 mg | ORAL_TABLET | Freq: Two times a day (BID) | ORAL | 3 refills | Status: DC
  Filled 2022-09-04: qty 60, 30d supply, fill #0

## 2022-09-04 MED ORDER — PROPOFOL 10 MG/ML IV BOLUS
INTRAVENOUS | Status: DC | PRN
  Administered 2022-09-04: 30 mg via INTRAVENOUS

## 2022-09-04 MED ORDER — PROPOFOL 500 MG/50ML IV EMUL
INTRAVENOUS | Status: DC | PRN
  Administered 2022-09-04: 175 ug/kg/min via INTRAVENOUS

## 2022-09-04 MED ORDER — APIXABAN 5 MG PO TABS
5.0000 mg | ORAL_TABLET | Freq: Two times a day (BID) | ORAL | 3 refills | Status: DC
  Filled 2022-09-04: qty 60, 30d supply, fill #0

## 2022-09-04 SURGICAL SUPPLY — 1 items: ELECT DEFIB PAD ADLT CADENCE (PAD) ×1 IMPLANT

## 2022-09-04 NOTE — Interval H&P Note (Signed)
History and Physical Interval Note:  09/04/2022 9:47 AM  Stephens November  has presented today for surgery, with the diagnosis of afib.  The various methods of treatment have been discussed with the patient and family. After consideration of risks, benefits and other options for treatment, the patient has consented to  Procedure(s): TRANSESOPHAGEAL ECHOCARDIOGRAM (N/A) CARDIOVERSION (N/A) as a surgical intervention.  The patient's history has been reviewed, patient examined, no change in status, stable for surgery.  I have reviewed the patient's chart and labs.  Questions were answered to the patient's satisfaction.    NPO for TEE/DCCV. Aflutter. On eliquis.   Gerri Spore T. Flora Lipps, MD, [facility]  [facility]  [address], [address] [place], [place] [address] [phone]  9:47 AM

## 2022-09-04 NOTE — Anesthesia Procedure Notes (Signed)
Procedure Name: General with mask airway Date/Time: 09/04/2022 11:20 AM  Performed by: Katina Degree, CRNAPre-anesthesia Checklist: Patient identified, Emergency Drugs available, Suction available and Patient being monitored Patient Re-evaluated:Patient Re-evaluated prior to induction Oxygen Delivery Method: Simple face mask Preoxygenation: Pre-oxygenation with 100% oxygen Induction Type: IV induction Airway Equipment and Method: Bite block Dental Injury: Teeth and Oropharynx as per pre-operative assessment

## 2022-09-04 NOTE — Progress Notes (Signed)
Remains on IV cardizem drip at max 15mg /hr with heart rate in 70s at rest and BP stable. When patient ambulates to restroom, HR jumps up to 140s. Patient states mild fatigue from ambulation to bathroom. Unable to rate level but says he's glad he is not out of breath and "not as bad as before". Daughter came to visit; she is a Engineer, civil (consulting). Advocating that cardioversion be done while patient is here because she worries about him getting tired ambulating at home over next weeks before potentially getting cardioversion outpatient. She plans to attend morning rounds.

## 2022-09-04 NOTE — Progress Notes (Signed)
S/P successful TEE/DCCV this am.  Currently stable.    Ok for discharge home form cardiac standpoint.  [facility] will sign off.   Medication Recommendations:  Toprol XL 50mg  daily (starting tomorrow 5/3), Eliquis 5mg  BID Other recommendations (labs, testing, etc):  none Follow up as an outpatient: afib clinic in 1 week and Dr. Tomie China in 4 weeks

## 2022-09-04 NOTE — Progress Notes (Signed)
*  PRELIMINARY RESULTS* Echocardiogram Echocardiogram Transesophageal has been performed.  Milda Smart 09/04/2022, 11:53 AM

## 2022-09-04 NOTE — Plan of Care (Signed)
Problem: Education: Goal: Knowledge of General Education information will improve Description: Including pain rating scale, medication(s)/side effects and non-pharmacologic comfort measures 09/04/2022 1450 by Herma Carson, RN Outcome: Adequate for Discharge 09/04/2022 0840 by Herma Carson, RN Outcome: Progressing   Problem: Health Behavior/Discharge Planning: Goal: Ability to manage health-related needs will improve 09/04/2022 1450 by Herma Carson, RN Outcome: Adequate for Discharge 09/04/2022 0840 by Herma Carson, RN Outcome: Progressing   Problem: Clinical Measurements: Goal: Ability to maintain clinical measurements within normal limits will improve 09/04/2022 1450 by Herma Carson, RN Outcome: Adequate for Discharge 09/04/2022 0840 by Herma Carson, RN Outcome: Progressing Goal: Will remain free from infection 09/04/2022 1450 by Herma Carson, RN Outcome: Adequate for Discharge 09/04/2022 0840 by Herma Carson, RN Outcome: Progressing Goal: Diagnostic test results will improve 09/04/2022 1450 by Herma Carson, RN Outcome: Adequate for Discharge 09/04/2022 0840 by Herma Carson, RN Outcome: Progressing Goal: Respiratory complications will improve 09/04/2022 1450 by Herma Carson, RN Outcome: Adequate for Discharge 09/04/2022 0840 by Herma Carson, RN Outcome: Progressing Goal: Cardiovascular complication will be avoided 09/04/2022 1450 by Herma Carson, RN Outcome: Adequate for Discharge 09/04/2022 0840 by Herma Carson, RN Outcome: Progressing   Problem: Activity: Goal: Risk for activity intolerance will decrease 09/04/2022 1450 by Herma Carson, RN Outcome: Adequate for Discharge 09/04/2022 0840 by Herma Carson, RN Outcome: Progressing   Problem: Nutrition: Goal: Adequate nutrition will be maintained 09/04/2022 1450 by Herma Carson, RN Outcome: Adequate for Discharge 09/04/2022 0840 by Herma Carson, RN Outcome: Progressing   Problem:  Coping: Goal: Level of anxiety will decrease 09/04/2022 1450 by Herma Carson, RN Outcome: Adequate for Discharge 09/04/2022 0840 by Herma Carson, RN Outcome: Progressing   Problem: Elimination: Goal: Will not experience complications related to bowel motility 09/04/2022 1450 by Herma Carson, RN Outcome: Adequate for Discharge 09/04/2022 0840 by Herma Carson, RN Outcome: Progressing Goal: Will not experience complications related to urinary retention 09/04/2022 1450 by Herma Carson, RN Outcome: Adequate for Discharge 09/04/2022 0840 by Herma Carson, RN Outcome: Progressing   Problem: Pain Managment: Goal: General experience of comfort will improve 09/04/2022 1450 by Herma Carson, RN Outcome: Adequate for Discharge 09/04/2022 0840 by Herma Carson, RN Outcome: Progressing   Problem: Safety: Goal: Ability to remain free from injury will improve 09/04/2022 1450 by Herma Carson, RN Outcome: Adequate for Discharge 09/04/2022 0840 by Herma Carson, RN Outcome: Progressing   Problem: Skin Integrity: Goal: Risk for impaired skin integrity will decrease 09/04/2022 1450 by Herma Carson, RN Outcome: Adequate for Discharge 09/04/2022 0840 by Herma Carson, RN Outcome: Progressing   Problem: Education: Goal: Knowledge of disease or condition will improve 09/04/2022 1450 by Herma Carson, RN Outcome: Adequate for Discharge 09/04/2022 0840 by Herma Carson, RN Outcome: Progressing Goal: Understanding of medication regimen will improve 09/04/2022 1450 by Herma Carson, RN Outcome: Adequate for Discharge 09/04/2022 0840 by Herma Carson, RN Outcome: Progressing Goal: Individualized Educational Video(s) 09/04/2022 1450 by Herma Carson, RN Outcome: Adequate for Discharge 09/04/2022 0840 by Herma Carson, RN Outcome: Progressing   Problem: Activity: Goal: Ability to tolerate increased activity will improve 09/04/2022 1450 by Herma Carson, RN Outcome: Adequate for  Discharge 09/04/2022 0840 by Herma Carson, RN Outcome: Progressing   Problem: Cardiac: Goal: Ability to achieve and maintain adequate cardiopulmonary perfusion will improve 09/04/2022 1450 by  Herma Carson, RN Outcome: Adequate for Discharge 09/04/2022 0840 by Herma Carson, RN Outcome: Progressing   Problem: Health Behavior/Discharge Planning: Goal: Ability to safely manage health-related needs after discharge will improve 09/04/2022 1450 by Herma Carson, RN Outcome: Adequate for Discharge 09/04/2022 0840 by Herma Carson, RN Outcome: Progressing

## 2022-09-04 NOTE — Progress Notes (Signed)
 Rounding Note    Patient Name: Louis [name] Date of Encounter: 09/04/2022  [facility] Cardiologist: Rajan R Revankar, MD   Subjective   The echo yesterday showed normal LV function with moderate aortic valve sclerosis and calcification but no aortic stenosis.  There was severe LVH.  Patient currently in atrial flutter with variable block heart rates in the 90s but his daughter who is a nurse is in the room and says whenever he gets up to go the bathroom his heart rate goes up in the 140s and he feels terrible. Inpatient Medications    Scheduled Meds:  apixaban  5 mg Oral BID   Chlorhexidine Gluconate Cloth  6 each Topical Daily   metoprolol tartrate  25 mg Oral BID   sodium chloride flush  10-40 mL Intracatheter Q12H   Continuous Infusions:  diltiazem (CARDIZEM) infusion 15 mg/hr (09/04/22 0316)   PRN Meds: acetaminophen **OR** acetaminophen, HYDROcodone-acetaminophen, ondansetron **OR** ondansetron (ZOFRAN) IV   Vital Signs    Vitals:   09/03/22 1300 09/03/22 1400 09/03/22 2047 09/04/22 0314  BP:   (!) 143/77 (!) 154/73  Pulse: 79 77  80  Resp:  18 18 20  Temp:   97.6 F (36.4 C) 98.1 F (36.7 C)  TempSrc:   Oral Oral  SpO2:   96% 95%  Weight:      Height:        Intake/Output Summary (Last 24 hours) at 09/04/2022 0841 Last data filed at 09/03/2022 1602 Gross per 24 hour  Intake 152.24 ml  Output --  Net 152.24 ml       09/02/2022   11:42 AM 08/12/2022   10:38 AM 07/23/2022    9:36 AM  Last 3 Weights  Weight (lbs) 220 lb 220 lb 9.6 oz 221 lb  Weight (kg) 99.791 kg 100.064 kg 100.245 kg      Telemetry    Atrial flutter with variable block but heart rate increases into the 140s with ambulation- Personally Reviewed  ECG    No new ECG tracing today. - Personally Reviewed  Physical Exam   GEN: Well nourished, well developed in no acute distress HEENT: Normal NECK: No JVD; No carotid bruits LYMPHATICS: No lymphadenopathy CARDIAC:  Irregular, no murmurs, rubs, gallops RESPIRATORY:  Clear to auscultation without rales, wheezing or rhonchi  ABDOMEN: Soft, non-tender, non-distended MUSCULOSKELETAL:  No edema; No deformity  SKIN: Warm and dry NEUROLOGIC:  Alert and oriented x 3 PSYCHIATRIC:  Normal affect  Labs    High Sensitivity Troponin:   Recent Labs  Lab 09/02/22 1144 09/02/22 1438 09/02/22 2210 09/03/22 0647  TROPONINIHS 150* 169* 165* 186*      Chemistry Recent Labs  Lab 09/02/22 2210 09/03/22 0647 09/04/22 0336  NA 136 137 136  K 4.4 4.4 4.4  CL 112* 110 110  CO2 17* 16* 18*  GLUCOSE 130* 124* 121*  BUN 33* 29* 30*  CREATININE 2.42* 2.26* 2.51*  CALCIUM 8.4* 8.3* 8.3*  MG 1.3* 2.2  --   PROT 6.0* 6.0* 5.9*  ALBUMIN 2.7* 2.7* 2.5*  AST 9* 10* 10*  ALT 9 10 11  ALKPHOS 93 96 85  BILITOT 0.6 0.5 0.4  GFRNONAA 27* 30* 26*  ANIONGAP 7 11 8     Lipids No results for input(s): "CHOL", "TRIG", "HDL", "LABVLDL", "LDLCALC", "CHOLHDL" in the last 168 hours.  Hematology Recent Labs  Lab 09/02/22 1144 09/02/22 2210 09/03/22 1023  WBC 8.1 9.0 9.1  RBC 3.67* 3.43*  3.45*   3.58*  HGB 9.9* 9.2* 9.7*  HCT 32.1* 29.8* 30.8*  MCV 87.5 86.9 86.0  MCH 27.0 26.8 27.1  MCHC 30.8 30.9 31.5  RDW 17.0* 17.0* 16.9*  PLT 206 218 217    Thyroid  Recent Labs  Lab 09/02/22 1144 09/02/22 1248  TSH  --  2.009  FREET4 0.80  --      BNP Recent Labs  Lab 09/02/22 1231  BNP 804.9*     DDimer  Recent Labs  Lab 09/02/22 2210  DDIMER 0.62*      Radiology    ECHOCARDIOGRAM COMPLETE  Result Date: 09/03/2022    ECHOCARDIOGRAM REPORT   Patient Name:   Louis [name] Date of Exam: 09/03/2022 Medical Rec #:  [id number]        Height:       76.0 in Accession #:    [id number]       Weight:       220.0 lb Date of Birth:  02/13/1946         BSA:          2.307 m Patient Age:    76 years         BP:           131/69 mmHg Patient Gender: M                HR:           97 bpm. Exam Location:  Inpatient  Procedure: 2D Echo, Cardiac Doppler and Color Doppler Indications:    I48.91* Unspeicified atrial fibrillation  History:        Patient has prior history of Echocardiogram examinations.                 Arrythmias:LBBB; Risk Factors:Hypertension and Dyslipidemia.  Sonographer:    Lindsey Urban Referring Phys: DOUTOVA, ANASTASSIA IMPRESSIONS  1. Left ventricular ejection fraction, by estimation, is 55 to 60%. The left ventricle has normal function. The left ventricle has no regional wall motion abnormalities. There is severe asymmetric left ventricular hypertrophy of the basal-septal segment. Left ventricular diastolic parameters are indeterminate.  2. Right ventricular systolic function is hyperdynamic. The right ventricular size is normal. Tricuspid regurgitation signal is inadequate for assessing PA pressure.  3. The mitral valve was not well visualized. No evidence of mitral valve regurgitation. No evidence of mitral stenosis.  4. Abnormal calcification of the non coronary cusp leaflet. The aortic valve is abnormal. Aortic valve regurgitation is not visualized. No aortic stenosis is present.  5. Aortic dilatation noted. There is mild dilatation of the aortic root, measuring 43 mm. There is mild dilatation of the ascending aorta, measuring 44 mm.  6. The inferior vena cava is dilated in size with >50% respiratory variability, suggesting right atrial pressure of 8 mmHg. Comparison(s): No significant change from prior study. FINDINGS  Left Ventricle: Left ventricular ejection fraction, by estimation, is 55 to 60%. The left ventricle has normal function. The left ventricle has no regional wall motion abnormalities. The left ventricular internal cavity size was normal in size. There is  severe asymmetric left ventricular hypertrophy of the basal-septal segment. Abnormal (paradoxical) septal motion, consistent with left bundle branch block. Left ventricular diastolic parameters are indeterminate. Right Ventricle: The  right ventricular size is normal. No increase in right ventricular wall thickness. Right ventricular systolic function is hyperdynamic. Tricuspid regurgitation signal is inadequate for assessing PA pressure. Left Atrium: Left atrial size was normal in size. Right Atrium: Right atrial size was   normal in size. Pericardium: There is no evidence of pericardial effusion. Mitral Valve: The mitral valve was not well visualized. No evidence of mitral valve regurgitation. No evidence of mitral valve stenosis. Tricuspid Valve: The tricuspid valve is grossly normal. Tricuspid valve regurgitation is not demonstrated. No evidence of tricuspid stenosis. Aortic Valve: Abnormal calcification of the non coronary cusp leaflet. The aortic valve is abnormal. Aortic valve regurgitation is not visualized. No aortic stenosis is present. Pulmonic Valve: The pulmonic valve was not well visualized. Pulmonic valve regurgitation is not visualized. No evidence of pulmonic stenosis. Aorta: Aortic dilatation noted. There is mild dilatation of the aortic root, measuring 43 mm. There is mild dilatation of the ascending aorta, measuring 44 mm. Venous: The inferior vena cava is dilated in size with greater than 50% respiratory variability, suggesting right atrial pressure of 8 mmHg. IAS/Shunts: No atrial level shunt detected by color flow Doppler.  LEFT VENTRICLE PLAX 2D LVIDd:         4.70 cm      Diastology LVIDs:         3.30 cm      LV e' medial:    12.20 cm/s LV PW:         1.60 cm      LV E/e' medial:  8.1 LV IVS:        2.00 cm      LV e' lateral:   13.90 cm/s LVOT diam:     2.40 cm      LV E/e' lateral: 7.1 LV SV:         102 LV SV Index:   44 LVOT Area:     4.52 cm  LV Volumes (MOD) LV vol d, MOD A2C: 162.0 ml LV vol d, MOD A4C: 120.0 ml LV vol s, MOD A2C: 64.8 ml LV vol s, MOD A4C: 51.9 ml LV SV MOD A2C:     97.2 ml LV SV MOD A4C:     120.0 ml LV SV MOD BP:      93.5 ml RIGHT VENTRICLE             IVC RV Basal diam:  4.00 cm     IVC diam:  2.30 cm RV S prime:     13.50 cm/s TAPSE (M-mode): 2.1 cm LEFT ATRIUM             Index        RIGHT ATRIUM           Index LA diam:        4.10 cm 1.78 cm/m   RA Area:     16.80 cm LA Vol (A2C):   84.3 ml 36.54 ml/m  RA Volume:   39.20 ml  16.99 ml/m LA Vol (A4C):   61.9 ml 26.83 ml/m LA Biplane Vol: 72.3 ml 31.34 ml/m  AORTIC VALVE LVOT Vmax:   122.00 cm/s LVOT Vmean:  84.600 cm/s LVOT VTI:    0.225 m  AORTA Ao Root diam: 4.30 cm Ao Asc diam:  4.40 cm MITRAL VALVE MV Area (PHT): 4.46 cm    SHUNTS MV Decel Time: 170 msec    Systemic VTI:  0.22 m MV E velocity: 99.20 cm/s  Systemic Diam: 2.40 cm MV A velocity: 40.40 cm/s MV E/A ratio:  2.46 Mahesh Chandrasekhar MD Electronically signed by Mahesh Chandrasekhar MD Signature Date/Time: 09/03/2022/4:21:49 PM    Final    NM Pulmonary Perfusion  Result Date: 09/03/2022 CLINICAL DATA:  Stage IV colon cancer, tachycardia,   shortness of breath, question pulmonary embolism EXAM: NUCLEAR MEDICINE PERFUSION LUNG SCAN TECHNIQUE: Perfusion images were obtained in multiple projections after intravenous injection of radiopharmaceutical. Ventilation scans intentionally deferred if perfusion scan and chest x-ray adequate for interpretation during COVID 19 epidemic. RADIOPHARMACEUTICALS:  4.1 mCi Tc-99m MAA IV COMPARISON:  Chest radiograph 09/02/2022 FINDINGS: Normal perfusion lung scan. No perfusion defects. Residual tracer within RIGHT Port-A-Cath/tubing. IMPRESSION: Normal perfusion lung scan. Electronically Signed   By: Mark  Boles M.D.   On: 09/03/2022 11:30   DG Chest Portable 1 View  Result Date: 09/02/2022 CLINICAL DATA:  Shortness of breath EXAM: PORTABLE CHEST 1 VIEW COMPARISON:  None Available. FINDINGS: No pleural effusion. No pneumothorax. No focal airspace opacity. Cardiomegaly. No radiographically apparent displaced rib fractures. Visualized upper abdomen is unremarkable. IMPRESSION: Cardiomegaly.  No pleural effusion or significant pulmonary edema.  Electronically Signed   By: Hemant  Desai M.D.   On: 09/02/2022 12:16    Cardiac Studies   Echocardiogram 01/19/2018: Study Conclusions: - Left ventricle: The cavity size was normal. Wall thickness was    increased in a pattern of moderate LVH. The estimated ejection    fraction was 55%. Septal-lateral dyssynchrony consistent with    LBBB. Doppler parameters are consistent with abnormal left    ventricular relaxation (grade 1 diastolic dysfunction).  - Aortic valve: There was no stenosis.  - Aorta: Dilated aortic root and ascending aorta. Ascending aorta    dimension: 45 mm. Aortic root dimension: 41 mm (ED).  - Mitral valve: There was no significant regurgitation.  - Right ventricle: The cavity size was normal. Systolic function    was normal.  - Right atrium: The atrium was mildly dilated.  - Tricuspid valve: Peak RV-RA gradient (S): 18 mm Hg.  - Pulmonary arteries: PA peak pressure: 21 mm Hg (S).  - Inferior vena cava: The vessel was normal in size. The    respirophasic diameter changes were in the normal range (= 50%),    consistent with normal central venous pressure.   Impressions:  - Normal LV size with moderate LV hypertrophy. EF 55% with    septal-lateral dyssynchrony consistent with LBBB. Normal RV size    and systolic function. No significant valvular abnormalities.  _______________  Myoview 08/29/2019: The left ventricular ejection fraction is mildly decreased (45-54%). Nuclear stress EF: 53%. Contractile performance compatible with left bundle branch block There was no ST segment deviation noted during stress. Defect 1: There is a small defect of mild severity present in the apex location. Possible artifact This is a low risk study. There are no areas of significant ischemia identified.   Overall, no significant change from prior nuclear stress test in 2019.   Patient Profile     76 y.o. male with a history of chronic LBBB, ascending thoracic aortic aneurysm,  hypertension, hyperlipidemia,  CKD stage IIIb, and stage IV colon cancer who admitted on 09/02/2022 for new onset atrial flutter after presenting with worsening shortness of breath and elevated heart rate.  Assessment & Plan    New Onset Atrial Flutter with RVR Presented with worsening shortness of breath and fatigue and was found to be in new onset atrial flutter with RVR. Rates initially in the 130s. Magnesium was low at 1.3  (repleted) but potassium and TSH okay. He was started on IV Diltiazem in the ED. -Continues to remain in atrial flutter with heart rate in the 90s but when he gets up to walk heart rate shoots up in the 140s and he   feels bad - 2D echo shows normal LV function EF 55% abnormal septal wall motion due to left bundle branch block - Continue IV Diltiazem for now and will transition to p.o. after TEE cardioversion - Continue Lopressor 25 mg twice daily - CHA2DS2-VASc = 3 (HTN and age x2).  Continue Eliquis 5 mg twice daily -Given the fact that the patient continues to have heart rates up in the 130s to 140s with ambulation and feels poorly despite being controlled at rest will pursue TEE/DCCV -Shared Decision Making/Informed Consent The risks [stroke, cardiac arrhythmias rarely resulting in the need for a temporary or permanent pacemaker, skin irritation or burns, esophageal damage, perforation (1:10,000 risk), bleeding, pharyngeal hematoma as well as other potential complications associated with conscious sedation including aspiration, arrhythmia, respiratory failure and death], benefits (treatment guidance, restoration of normal sinus rhythm, diagnostic support) and alternatives of a transesophageal echocardiogram guided cardioversion were discussed in detail with Mr. Engelhard and he is willing to proceed.  Dyspnea on Exertion Patient presented with worsening shortness of breath over the last couple of weeks. BNP elevated at 804. Chest x-ray showed cardiomegaly but no edema or pleural  effusions. D-dimer was negative when adjusted for age; however, V/Q scan has been ordered by primary team to rule out PE given underlying malignancy.  -Lungs are clear on exam today -2D echo shows normal LV function -V/Q scan pending. -Suspect that his shortness of breath is mainly related to atrial flutter with loss of atrial kick>> BNP normal   Elevated Troponin Chronic LBBB High-sensitivity troponin mildly elevated and flat at 150 >> 169 >> 165. Myoview in 2021 showed a fixed defect but otherwise normal perfusion. - No chest pain.  - 2D echo with normal LV function - Suspect demand ischemia in setting of atrial flutter.  No Further ischemic workup at this time  Hypertension -BP mildly elevated on admission but improved this morning. -Continue IV Diltiazem and Lopressor 25 mg twice daily  CKD Stage IIIb Creatinine 2.27 on admission. Baseline around 2.2 to 2.4.  -Serum creatinine 2.51 this morning - Continue to monitor closely.  Hypomagnesemia - Magnesium was 1.3 on admission. Repleted.  - Magnesium improved to 2.2 yesterday  Otherwise, per primary team: - Stage IV colon cancer - Chronic anemia - Metabolic acidosis - Right leg edema: dopplers pending to rule out DVT - Hyperlipdiemia  I have spent a total of 35 minutes with patient reviewing 2D echo , telemetry, EKGs, labs and examining patient as well as establishing an assessment and plan that was discussed with the patient.  > 50% of time was spent in direct patient care.    For questions or updates, please contact [facility] Please consult [email or web address] for contact info under        Signed, Claudeen Leason, MD  09/04/2022, 8:41 AM    

## 2022-09-04 NOTE — Progress Notes (Addendum)
Pharmacy note: apixban and encorafenib   77 yo male with metastatic colon cancer to restart encorafenib. He is noted on apixaban for aflutter.  Pharmacy was asked to look into the potential for a drug-drug interaction  Encorafenib -Strong CYP3A4 inducer and major substrate, minor substrate of P-gycoprotein  - With CYP3A4 competition there may be a potential for an increased risk of bleeding -With strong CYP induction only there are no specific manufacturer recommendations with avoidance of apixaban -Some sources suggest careful monitoring: Drug-Drug Interactions of Direct Oral Anticoagulants (DOACs): From Pharmacological to Clinical Practice - PMC (http://www.myers.net/)   Alternative options -Dabigatran: would avoid with renal function due to increased risk of bleed -Edoxaban: not on the patient's formulary   Summary -I could not locate any direct evidence with the combination of encorafenib and apixaban -Since encorafenib is a strong CYP3A4 inducer but does not induce P-glycoprotein, I believe that apixaban can be continued.  -If an alternative if preferred then would use warfarin   Harland German, PharmD Clinical Pharmacist **Pharmacist phone directory can now be found on [email or web address] ([email or web address]).  Listed under [facility].

## 2022-09-04 NOTE — CV Procedure (Signed)
   TRANSESOPHAGEAL ECHOCARDIOGRAM GUIDED DIRECT CURRENT CARDIOVERSION  NAME:  Louis [name]    MRN: [id number] DOB:  06-11-1945    ADMIT DATE: 09/02/2022  INDICATIONS: Symptomatic atrial flutter  PROCEDURE:   Informed consent was obtained prior to the procedure. The risks, benefits and alternatives for the procedure were discussed and the patient comprehended these risks.  Risks include, but are not limited to, cough, sore throat, vomiting, nausea, somnolence, esophageal and stomach trauma or perforation, bleeding, low blood pressure, aspiration, pneumonia, infection, trauma to the teeth and death.    After a procedural time-out, the oropharynx was anesthetized and the patient was sedated by the anesthesia service. The transesophageal probe was inserted in the esophagus and stomach without difficulty and multiple views were obtained. Anesthesia was monitored by Dr. Okey Dupre.   COMPLICATIONS:    Complications: No complications Patient tolerated procedure well.  KEY FINDINGS:  No LAA thrombus.  No significant valvular heart disease.  Full Report to follow.   CARDIOVERSION:     Indications:  Symptomatic Atrial Flutter  Procedure Details:  Once the TEE was complete, the patient had the defibrillator pads placed in the anterior and posterior position. Once an appropriate level of sedation was confirmed, the patient was cardioverted x 1 with 200J of biphasic synchronized energy.  The patient converted to NSR.  There were no apparent complications.  The patient had normal neuro status and respiratory status post procedure with vitals stable as recorded elsewhere.  Adequate airway was maintained throughout and vital signs monitored per protocol.  Gerri Spore T. Flora Lipps, MD [facility]  [address] [place], [place] [address] [phone]  11:44 AM

## 2022-09-04 NOTE — H&P (View-Only) (Signed)
Rounding Note    Patient Name: Louis [name] Date of Encounter: 09/04/2022  [facility] Cardiologist: Garwin Brothers, MD   Subjective   The echo yesterday showed normal LV function with moderate aortic valve sclerosis and calcification but no aortic stenosis.  There was severe LVH.  Patient currently in atrial flutter with variable block heart rates in the 90s but his daughter who is a nurse is in the room and says whenever he gets up to go the bathroom his heart rate goes up in the 140s and he feels terrible. Inpatient Medications    Scheduled Meds:  apixaban  5 mg Oral BID   Chlorhexidine Gluconate Cloth  6 each Topical Daily   metoprolol tartrate  25 mg Oral BID   sodium chloride flush  10-40 mL Intracatheter Q12H   Continuous Infusions:  diltiazem (CARDIZEM) infusion 15 mg/hr (09/04/22 0316)   PRN Meds: acetaminophen **OR** acetaminophen, HYDROcodone-acetaminophen, ondansetron **OR** ondansetron (ZOFRAN) IV   Vital Signs    Vitals:   09/03/22 1300 09/03/22 1400 09/03/22 2047 09/04/22 0314  BP:   (!) 143/77 (!) 154/73  Pulse: 79 77  80  Resp:  18 18 20   Temp:   97.6 F (36.4 C) 98.1 F (36.7 C)  TempSrc:   Oral Oral  SpO2:   96% 95%  Weight:      Height:        Intake/Output Summary (Last 24 hours) at 09/04/2022 0841 Last data filed at 09/03/2022 1602 Gross per 24 hour  Intake 152.24 ml  Output --  Net 152.24 ml       09/02/2022   11:42 AM 08/12/2022   10:38 AM 07/23/2022    9:36 AM  Last 3 Weights  Weight (lbs) 220 lb 220 lb 9.6 oz 221 lb  Weight (kg) 99.791 kg 100.064 kg 100.245 kg      Telemetry    Atrial flutter with variable block but heart rate increases into the 140s with ambulation- Personally Reviewed  ECG    No new ECG tracing today. - Personally Reviewed  Physical Exam   GEN: Well nourished, well developed in no acute distress HEENT: Normal NECK: No JVD; No carotid bruits LYMPHATICS: No lymphadenopathy CARDIAC:  Irregular, no murmurs, rubs, gallops RESPIRATORY:  Clear to auscultation without rales, wheezing or rhonchi  ABDOMEN: Soft, non-tender, non-distended MUSCULOSKELETAL:  No edema; No deformity  SKIN: Warm and dry NEUROLOGIC:  Alert and oriented x 3 PSYCHIATRIC:  Normal affect  Labs    High Sensitivity Troponin:   Recent Labs  Lab 09/02/22 1144 09/02/22 1438 09/02/22 2210 09/03/22 0647  TROPONINIHS 150* 169* 165* 186*      Chemistry Recent Labs  Lab 09/02/22 2210 09/03/22 0647 09/04/22 0336  NA 136 137 136  K 4.4 4.4 4.4  CL 112* 110 110  CO2 17* 16* 18*  GLUCOSE 130* 124* 121*  BUN 33* 29* 30*  CREATININE 2.42* 2.26* 2.51*  CALCIUM 8.4* 8.3* 8.3*  MG 1.3* 2.2  --   PROT 6.0* 6.0* 5.9*  ALBUMIN 2.7* 2.7* 2.5*  AST 9* 10* 10*  ALT 9 10 11   ALKPHOS 93 96 85  BILITOT 0.6 0.5 0.4  GFRNONAA 27* 30* 26*  ANIONGAP 7 11 8      Lipids No results for input(s): "CHOL", "TRIG", "HDL", "LABVLDL", "LDLCALC", "CHOLHDL" in the last 168 hours.  Hematology Recent Labs  Lab 09/02/22 1144 09/02/22 2210 09/03/22 1023  WBC 8.1 9.0 9.1  RBC 3.67* 3.43*  3.45*  3.58*  HGB 9.9* 9.2* 9.7*  HCT 32.1* 29.8* 30.8*  MCV 87.5 86.9 86.0  MCH 27.0 26.8 27.1  MCHC 30.8 30.9 31.5  RDW 17.0* 17.0* 16.9*  PLT 206 218 217    Thyroid  Recent Labs  Lab 09/02/22 1144 09/02/22 1248  TSH  --  2.009  FREET4 0.80  --      BNP Recent Labs  Lab 09/02/22 1231  BNP 804.9*     DDimer  Recent Labs  Lab 09/02/22 2210  DDIMER 0.62*      Radiology    ECHOCARDIOGRAM COMPLETE  Result Date: 09/03/2022    ECHOCARDIOGRAM REPORT   Patient Name:   Louis [name] Date of Exam: 09/03/2022 Medical Rec #:  [id number]        Height:       76.0 in Accession #:    [id number]       Weight:       220.0 lb Date of Birth:  [birth year]         BSA:          2.307 m Patient Age:    76 years         BP:           131/69 mmHg Patient Gender: M                HR:           97 bpm. Exam Location:  Inpatient  Procedure: 2D Echo, Cardiac Doppler and Color Doppler Indications:    I48.91* Unspeicified atrial fibrillation  History:        Patient has prior history of Echocardiogram examinations.                 Arrythmias:LBBB; Risk Factors:Hypertension and Dyslipidemia.  Sonographer:    Mike Gip Referring Phys: Therisa Doyne IMPRESSIONS  1. Left ventricular ejection fraction, by estimation, is 55 to 60%. The left ventricle has normal function. The left ventricle has no regional wall motion abnormalities. There is severe asymmetric left ventricular hypertrophy of the basal-septal segment. Left ventricular diastolic parameters are indeterminate.  2. Right ventricular systolic function is hyperdynamic. The right ventricular size is normal. Tricuspid regurgitation signal is inadequate for assessing PA pressure.  3. The mitral valve was not well visualized. No evidence of mitral valve regurgitation. No evidence of mitral stenosis.  4. Abnormal calcification of the non coronary cusp leaflet. The aortic valve is abnormal. Aortic valve regurgitation is not visualized. No aortic stenosis is present.  5. Aortic dilatation noted. There is mild dilatation of the aortic root, measuring 43 mm. There is mild dilatation of the ascending aorta, measuring 44 mm.  6. The inferior vena cava is dilated in size with >50% respiratory variability, suggesting right atrial pressure of 8 mmHg. Comparison(s): No significant change from prior study. FINDINGS  Left Ventricle: Left ventricular ejection fraction, by estimation, is 55 to 60%. The left ventricle has normal function. The left ventricle has no regional wall motion abnormalities. The left ventricular internal cavity size was normal in size. There is  severe asymmetric left ventricular hypertrophy of the basal-septal segment. Abnormal (paradoxical) septal motion, consistent with left bundle branch block. Left ventricular diastolic parameters are indeterminate. Right Ventricle: The  right ventricular size is normal. No increase in right ventricular wall thickness. Right ventricular systolic function is hyperdynamic. Tricuspid regurgitation signal is inadequate for assessing PA pressure. Left Atrium: Left atrial size was normal in size. Right Atrium: Right atrial size was  normal in size. Pericardium: There is no evidence of pericardial effusion. Mitral Valve: The mitral valve was not well visualized. No evidence of mitral valve regurgitation. No evidence of mitral valve stenosis. Tricuspid Valve: The tricuspid valve is grossly normal. Tricuspid valve regurgitation is not demonstrated. No evidence of tricuspid stenosis. Aortic Valve: Abnormal calcification of the non coronary cusp leaflet. The aortic valve is abnormal. Aortic valve regurgitation is not visualized. No aortic stenosis is present. Pulmonic Valve: The pulmonic valve was not well visualized. Pulmonic valve regurgitation is not visualized. No evidence of pulmonic stenosis. Aorta: Aortic dilatation noted. There is mild dilatation of the aortic root, measuring 43 mm. There is mild dilatation of the ascending aorta, measuring 44 mm. Venous: The inferior vena cava is dilated in size with greater than 50% respiratory variability, suggesting right atrial pressure of 8 mmHg. IAS/Shunts: No atrial level shunt detected by color flow Doppler.  LEFT VENTRICLE PLAX 2D LVIDd:         4.70 cm      Diastology LVIDs:         3.30 cm      LV e' medial:    12.20 cm/s LV PW:         1.60 cm      LV E/e' medial:  8.1 LV IVS:        2.00 cm      LV e' lateral:   13.90 cm/s LVOT diam:     2.40 cm      LV E/e' lateral: 7.1 LV SV:         102 LV SV Index:   44 LVOT Area:     4.52 cm  LV Volumes (MOD) LV vol d, MOD A2C: 162.0 ml LV vol d, MOD A4C: 120.0 ml LV vol s, MOD A2C: 64.8 ml LV vol s, MOD A4C: 51.9 ml LV SV MOD A2C:     97.2 ml LV SV MOD A4C:     120.0 ml LV SV MOD BP:      93.5 ml RIGHT VENTRICLE             IVC RV Basal diam:  4.00 cm     IVC diam:  2.30 cm RV S prime:     13.50 cm/s TAPSE (M-mode): 2.1 cm LEFT ATRIUM             Index        RIGHT ATRIUM           Index LA diam:        4.10 cm 1.78 cm/m   RA Area:     16.80 cm LA Vol (A2C):   84.3 ml 36.54 ml/m  RA Volume:   39.20 ml  16.99 ml/m LA Vol (A4C):   61.9 ml 26.83 ml/m LA Biplane Vol: 72.3 ml 31.34 ml/m  AORTIC VALVE LVOT Vmax:   122.00 cm/s LVOT Vmean:  84.600 cm/s LVOT VTI:    0.225 m  AORTA Ao Root diam: 4.30 cm Ao Asc diam:  4.40 cm MITRAL VALVE MV Area (PHT): 4.46 cm    SHUNTS MV Decel Time: 170 msec    Systemic VTI:  0.22 m MV E velocity: 99.20 cm/s  Systemic Diam: 2.40 cm MV A velocity: 40.40 cm/s MV E/A ratio:  2.46 Riley Lam MD Electronically signed by Riley Lam MD Signature Date/Time: 09/03/2022/4:21:49 PM    Final    NM Pulmonary Perfusion  Result Date: 09/03/2022 CLINICAL DATA:  Stage IV colon cancer, tachycardia,  shortness of breath, question pulmonary embolism EXAM: NUCLEAR MEDICINE PERFUSION LUNG SCAN TECHNIQUE: Perfusion images were obtained in multiple projections after intravenous injection of radiopharmaceutical. Ventilation scans intentionally deferred if perfusion scan and chest x-ray adequate for interpretation during COVID 19 epidemic. RADIOPHARMACEUTICALS:  4.1 mCi Tc-49m MAA IV COMPARISON:  Chest radiograph 09/02/2022 FINDINGS: Normal perfusion lung scan. No perfusion defects. Residual tracer within RIGHT Port-A-Cath/tubing. IMPRESSION: Normal perfusion lung scan. Electronically Signed   By: Ulyses Southward M.D.   On: 09/03/2022 11:30   DG Chest Portable 1 View  Result Date: 09/02/2022 CLINICAL DATA:  Shortness of breath EXAM: PORTABLE CHEST 1 VIEW COMPARISON:  None Available. FINDINGS: No pleural effusion. No pneumothorax. No focal airspace opacity. Cardiomegaly. No radiographically apparent displaced rib fractures. Visualized upper abdomen is unremarkable. IMPRESSION: Cardiomegaly.  No pleural effusion or significant pulmonary edema.  Electronically Signed   By: Lorenza Cambridge M.D.   On: 09/02/2022 12:16    Cardiac Studies   Echocardiogram 01/19/2018: Study Conclusions: - Left ventricle: The cavity size was normal. Wall thickness was    increased in a pattern of moderate LVH. The estimated ejection    fraction was 55%. Septal-lateral dyssynchrony consistent with    LBBB. Doppler parameters are consistent with abnormal left    ventricular relaxation (grade 1 diastolic dysfunction).  - Aortic valve: There was no stenosis.  - Aorta: Dilated aortic root and ascending aorta. Ascending aorta    dimension: 45 mm. Aortic root dimension: 41 mm (ED).  - Mitral valve: There was no significant regurgitation.  - Right ventricle: The cavity size was normal. Systolic function    was normal.  - Right atrium: The atrium was mildly dilated.  - Tricuspid valve: Peak RV-RA gradient (S): 18 mm Hg.  - Pulmonary arteries: PA peak pressure: 21 mm Hg (S).  - Inferior vena cava: The vessel was normal in size. The    respirophasic diameter changes were in the normal range (= 50%),    consistent with normal central venous pressure.   Impressions:  - Normal LV size with moderate LV hypertrophy. EF 55% with    septal-lateral dyssynchrony consistent with LBBB. Normal RV size    and systolic function. No significant valvular abnormalities.  _______________  Myoview 08/29/2019: The left ventricular ejection fraction is mildly decreased (45-54%). Nuclear stress EF: 53%. Contractile performance compatible with left bundle branch block There was no ST segment deviation noted during stress. Defect 1: There is a small defect of mild severity present in the apex location. Possible artifact This is a low risk study. There are no areas of significant ischemia identified.   Overall, no significant change from prior nuclear stress test in 2019.   Patient Profile     77 y.o. male with a history of chronic LBBB, ascending thoracic aortic aneurysm,  hypertension, hyperlipidemia,  CKD stage IIIb, and stage IV colon cancer who admitted on 09/02/2022 for new onset atrial flutter after presenting with worsening shortness of breath and elevated heart rate.  Assessment & Plan    New Onset Atrial Flutter with RVR Presented with worsening shortness of breath and fatigue and was found to be in new onset atrial flutter with RVR. Rates initially in the 130s. Magnesium was low at 1.3  (repleted) but potassium and TSH okay. He was started on IV Diltiazem in the ED. -Continues to remain in atrial flutter with heart rate in the 90s but when he gets up to walk heart rate shoots up in the 140s and he  feels bad - 2D echo shows normal LV function EF 55% abnormal septal wall motion due to left bundle branch block - Continue IV Diltiazem for now and will transition to p.o. after TEE cardioversion - Continue Lopressor 25 mg twice daily - CHA2DS2-VASc = 3 (HTN and age x2).  Continue Eliquis 5 mg twice daily -Given the fact that the patient continues to have heart rates up in the 130s to 140s with ambulation and feels poorly despite being controlled at rest will pursue TEE/DCCV -Shared Decision Making/Informed Consent The risks [stroke, cardiac arrhythmias rarely resulting in the need for a temporary or permanent pacemaker, skin irritation or burns, esophageal damage, perforation (1:10,000 risk), bleeding, pharyngeal hematoma as well as other potential complications associated with conscious sedation including aspiration, arrhythmia, respiratory failure and death], benefits (treatment guidance, restoration of normal sinus rhythm, diagnostic support) and alternatives of a transesophageal echocardiogram guided cardioversion were discussed in detail with Mr. Bruso and he is willing to proceed.  Dyspnea on Exertion Patient presented with worsening shortness of breath over the last couple of weeks. BNP elevated at 804. Chest x-ray showed cardiomegaly but no edema or pleural  effusions. D-dimer was negative when adjusted for age; however, V/Q scan has been ordered by primary team to rule out PE given underlying malignancy.  -Lungs are clear on exam today -2D echo shows normal LV function -V/Q scan pending. -Suspect that his shortness of breath is mainly related to atrial flutter with loss of atrial kick>> BNP normal   Elevated Troponin Chronic LBBB High-sensitivity troponin mildly elevated and flat at 150 >> 169 >> 165. Myoview in 2021 showed a fixed defect but otherwise normal perfusion. - No chest pain.  - 2D echo with normal LV function - Suspect demand ischemia in setting of atrial flutter.  No Further ischemic workup at this time  Hypertension -BP mildly elevated on admission but improved this morning. -Continue IV Diltiazem and Lopressor 25 mg twice daily  CKD Stage IIIb Creatinine 2.27 on admission. Baseline around 2.2 to 2.4.  -Serum creatinine 2.51 this morning - Continue to monitor closely.  Hypomagnesemia - Magnesium was 1.3 on admission. Repleted.  - Magnesium improved to 2.2 yesterday  Otherwise, per primary team: - Stage IV colon cancer - Chronic anemia - Metabolic acidosis - Right leg edema: dopplers pending to rule out DVT - Hyperlipdiemia  I have spent a total of 35 minutes with patient reviewing 2D echo , telemetry, EKGs, labs and examining patient as well as establishing an assessment and plan that was discussed with the patient.  > 50% of time was spent in direct patient care.    For questions or updates, please contact [facility] Please consult [email or web address] for contact info under        Signed, Armanda Magic, MD  09/04/2022, 8:41 AM

## 2022-09-04 NOTE — Anesthesia Preprocedure Evaluation (Signed)
Anesthesia Evaluation  Patient identified by MRN, date of birth, ID band Patient awake    Reviewed: Allergy & Precautions, H&P , NPO status , Patient's Chart, lab work & pertinent test results  Airway Mallampati: II  TM Distance: >3 FB Neck ROM: Full    Dental no notable dental hx.    Pulmonary neg pulmonary ROS   Pulmonary exam normal breath sounds clear to auscultation       Cardiovascular hypertension, + Peripheral Vascular Disease  Normal cardiovascular exam+ dysrhythmias Atrial Fibrillation  Rhythm:Irregular Rate:Normal     Neuro/Psych negative neurological ROS  negative psych ROS   GI/Hepatic negative GI ROS, Neg liver ROS,,,  Endo/Other  negative endocrine ROS    Renal/GU Renal InsufficiencyRenal disease  negative genitourinary   Musculoskeletal negative musculoskeletal ROS (+)    Abdominal   Peds negative pediatric ROS (+)  Hematology negative hematology ROS (+)   Anesthesia Other Findings   Reproductive/Obstetrics negative OB ROS                             Anesthesia Physical Anesthesia Plan  ASA: 3  Anesthesia Plan: General   Post-op Pain Management:    Induction: Intravenous  PONV Risk Score and Plan: 1 and Treatment may vary due to age or medical condition and Propofol infusion  Airway Management Planned: Nasal Cannula  Additional Equipment:   Intra-op Plan:   Post-operative Plan:   Informed Consent: I have reviewed the patients History and Physical, chart, labs and discussed the procedure including the risks, benefits and alternatives for the proposed anesthesia with the patient or authorized representative who has indicated his/her understanding and acceptance.     Dental advisory given  Plan Discussed with: CRNA and Surgeon  Anesthesia Plan Comments: (Mac for TEE, GA for cardioversion)       Anesthesia Quick Evaluation

## 2022-09-04 NOTE — Plan of Care (Signed)

## 2022-09-04 NOTE — Progress Notes (Signed)
Mobility Specialist Progress Note:   09/04/22 1400  Mobility  Activity Ambulated with assistance to bathroom  Level of Assistance Standby assist, set-up cues, supervision of patient - no hands on  Assistive Device Other (Comment) (IV Pole)  Distance Ambulated (ft) 30 ft  Activity Response Tolerated well  Mobility Referral Yes  $Mobility charge 1 Mobility   Pt initially agreeable to mobility session, requested to go BR. After BR, pt stated he didn't want to walk in the hallway "looking like this". HR 80s throughout ambulation to BR. Pt left sitting EOB with all needs met.   Addison Lank Mobility Specialist Please contact via SecureChat or  Rehab office at [phone]

## 2022-09-04 NOTE — Progress Notes (Signed)
Patient given discharge instructions and verbalized understanding. Port de-accessed by IV team RN and patient dressed himself. Patient taken by [facility] pharmacy for medications before discharge.

## 2022-09-04 NOTE — Discharge Summary (Signed)
Physician Discharge Summary   Patient: Louis [name] MRN: [id number] DOB: 1946/04/21  Admit date:     09/02/2022  Discharge date: 09/04/22  Discharge Physician: Alberteen Sam   PCP: [name], MD     Recommendations at discharge:  Follow up with Afib clinic in 1 week for new Afib post-cardioversion Afib clinic:  Please check CBC and BMP in 1 week Please resume BP meds as appropriate  Follow up with Nephrology as planned Follow up with Cardiology Dr. Tomie China in 1 month Follow up with Oncology Dr. 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 is a 77 y.o. 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    New onset atrial flutter Admitted on anticoagulation, dilt drip.  Rates controlled at rest, but had mild CHF and still significantly symptomatic with minimal exertion.    DCCV performed, successful.  Oncology and pharmacy discussed potential interaction between Encorafenib and apixaban, after literature search, this was suspected to be safe.        Acute on chronic diastolic congestive heart failure Patient with shortness of breath, crackles on exam in the setting of atrial flutter.  Treated with IV Lasix, symptoms resolved with cardioversion.  Essential hypertension BP soft at discharge, amlodipine and lisinopril held at discharge.   Metastatic colon cancer On encorafenib, follows with Dr. Truett Perna, upcoming appointment  planned.  Chronic kidney disease stage IV CKD stage IIIb ruled out, Cr stable relative to baseline 2.2-2.5   Demand ischemia Troponin elevated in setting of new atrial flutter, some chest tightness, improved with rate control.   Leg swelling US DVT negative.           The [facility] Controlled Substances Registry was reviewed for this patient prior to discharge.  Consultants: Oncology, Nephrology, Cardiology Procedures performed:  Echocardiogram TEE cardioversion   Disposition: Home Diet recommendation:  Discharge Diet Orders (From admission, onward)     Start     Ordered   09/04/22 0000  Diet - low sodium heart healthy        09/04/22 1427             DISCHARGE MEDICATION: Allergies as of 09/04/2022   No Known Allergies      Medication List     STOP taking these medications    amLODipine 10 MG tablet Commonly known as: NORVASC   doxycycline 100 MG tablet Commonly known as: VIBRA-TABS   labetalol 200 MG tablet Commonly known as: NORMODYNE   lisinopril 5 MG tablet Commonly known as: ZESTRIL       TAKE these medications    acetaminophen 325 MG tablet Commonly known as: TYLENOL Tylenol   apixaban 5 MG Tabs tablet Commonly known as: ELIQUIS Take 1 tablet (5 mg total) by mouth 2 (two) times daily.   encorafenib 75 MG capsule Commonly known as: BRAFTOVI Take 4 capsules (300 mg total) by mouth daily.   ferrous sulfate  325 (65 FE) MG EC tablet Take 1 tablet (325 mg total) by mouth 2 (two) times daily with a meal.   lidocaine-prilocaine cream Commonly known as: EMLA Apply 1 Application topically as needed. Apply 1/2 tablespoon to port site 2 hours prior to stick and cover with plastic wrap to numb site. DO NOT USE UNTIL 14 DAYS AFTER PORT IS PLACED.   loratadine 10 MG tablet Commonly known as: CLARITIN Take 10 mg by mouth daily as needed for allergies.   Magnesium Oxide 400 MG Caps Take 1 capsule (400 mg total) by mouth 2 (two)  times daily.   metoprolol tartrate 25 MG tablet Commonly known as: LOPRESSOR Take 1 tablet (25 mg total) by mouth 2 (two) times daily.   ondansetron 8 MG tablet Commonly known as: ZOFRAN Take 1 tablet (8 mg total) by mouth every 8 (eight) hours as needed for nausea or vomiting.   prochlorperazine 10 MG tablet Commonly known as: COMPAZINE Take 10 mg by mouth every 6 (six) hours as needed for nausea or vomiting.        Follow-up Information     [facility] Follow up.   Specialty: Cardiology Why: Hospital follow-up in the Atrial Fibrillation Clinic scheduled for 09/11/2022 at 9:00am. This is currently located on the 6th floor at [facility]. Contact information: [address] [id number] mc [address] [place] [address] [phone]        Revankar, Aundra Dubin, MD Follow up.   Specialty: Cardiology Why: Follow-up with General Cardiology scheduled for 10/28/2022 at 1:20pm. Please arrive 15 minutes early for check-in. If this date/time does not work for you, please call our office to reschedule. Contact information: [address] [place]  [place] [address] [phone]         Ladene Artist, MD Follow up.   Specialty: Oncology Why: On May 22 Contact information: [address] [place] [address] [phone]                 Discharge Instructions     Diet - low sodium heart healthy   Complete by: As directed    Discharge instructions   Complete by: As directed    **IMPORTANT DISCHARGE INSTRUCTIONS**   From Dr. Maryfrances Bunnell: You were evaluated for fatigue, exertional intolerance, fast heart rate and low blood pressure.  Here, we found this was from atrial flutter.  You were treated with diltiazem in the IV and this slowed the heart rate but did not control it.  Then you underwent "Cardioversion" and put the heart back in sinus rhythm.  Hopefully this will keep.  For  now: STOP your old blood pressure medicines amlodipine and lisinopril REPLACE your labetalol with metoprolol 25 mg twice daily  START the blood thinner apixaban 5 mg twice daily from now on  Go to the Afib clinic in 1 week Go see Dr. Tomie China in 4 weeks  You may need your amlodipine or lisinopril (or both) restarted  Resume your other meds, including Braftovi.  Go see Dr. Truett Perna on May 22 as planned   Increase activity slowly   Complete by: As directed        Discharge Exam: Filed Weights   09/02/22 1142  Weight: 99.8 kg    General: Pt is alert, awake, not in acute distress Cardiovascular: RRR, nl S1-S2, no murmurs appreciated.   No LE edema.   Respiratory: Normal respiratory rate and rhythm.  CTAB without rales  or wheezes. Abdominal: Abdomen soft and non-tender.  No distension or HSM.   Neuro/Psych: Strength symmetric in upper and lower extremities.  Judgment and insight appear normal.   Condition at discharge: good  The results of significant diagnostics from this hospitalization (including imaging, microbiology, ancillary and laboratory) are listed below for reference.   Imaging Studies: VAS [place] LOWER EXTREMITY VENOUS (DVT)  Result Date: 09/04/2022  Lower Venous DVT Study Patient Name:  RAMELLO CORDIAL  Date of Exam:   09/04/2022 Medical Rec #: [id number]         Accession #:    [id number] Date of Birth: [birth year]          Patient Gender: M Patient Age:   77 years Exam Location:  [facility] Procedure:      VAS [place] LOWER EXTREMITY VENOUS (DVT) Referring Phys: Jonny Ruiz DOUTOVA --------------------------------------------------------------------------------  Indications: Edema, and SOB.  Comparison Study: No prior studies. Performing Technologist: Jean Rosenthal RDMS, RVT  Examination Guidelines: A complete evaluation includes B-mode imaging, spectral Doppler, color Doppler, and power Doppler as needed of all accessible portions of each vessel. Bilateral testing is  considered an integral part of a complete examination. Limited examinations for reoccurring indications may be performed as noted. The reflux portion of the exam is performed with the patient in reverse Trendelenburg.  +---------+---------------+---------+-----------+----------+--------------+ RIGHT    CompressibilityPhasicitySpontaneityPropertiesThrombus Aging +---------+---------------+---------+-----------+----------+--------------+ CFV      Full           Yes      Yes                                 +---------+---------------+---------+-----------+----------+--------------+ SFJ      Full                                                        +---------+---------------+---------+-----------+----------+--------------+ FV Prox  Full                                                        +---------+---------------+---------+-----------+----------+--------------+ FV Mid   Full                                                        +---------+---------------+---------+-----------+----------+--------------+ FV DistalFull                                                        +---------+---------------+---------+-----------+----------+--------------+ PFV      Full                                                        +---------+---------------+---------+-----------+----------+--------------+ POP  Full           Yes      Yes                                 +---------+---------------+---------+-----------+----------+--------------+ PTV      Full                                                        +---------+---------------+---------+-----------+----------+--------------+ PERO     Full                                                        +---------+---------------+---------+-----------+----------+--------------+ Gastroc  Full                                                         +---------+---------------+---------+-----------+----------+--------------+   +---------+---------------+---------+-----------+----------+--------------+ LEFT     CompressibilityPhasicitySpontaneityPropertiesThrombus Aging +---------+---------------+---------+-----------+----------+--------------+ CFV      Full           Yes      Yes                                 +---------+---------------+---------+-----------+----------+--------------+ SFJ      Full                                                        +---------+---------------+---------+-----------+----------+--------------+ FV Prox  Full                                                        +---------+---------------+---------+-----------+----------+--------------+ FV Mid   Full                                                        +---------+---------------+---------+-----------+----------+--------------+ FV DistalFull                                                        +---------+---------------+---------+-----------+----------+--------------+ PFV      Full                                                        +---------+---------------+---------+-----------+----------+--------------+  POP      Full           Yes      Yes                                 +---------+---------------+---------+-----------+----------+--------------+ PTV      Full                                                        +---------+---------------+---------+-----------+----------+--------------+ PERO     Full                                                        +---------+---------------+---------+-----------+----------+--------------+ Gastroc  Full                                                        +---------+---------------+---------+-----------+----------+--------------+     Summary: RIGHT: - There is no evidence of deep vein thrombosis in the lower extremity.  - No cystic structure found in  the popliteal fossa.  LEFT: - There is no evidence of deep vein thrombosis in the lower extremity.  - No cystic structure found in the popliteal fossa.  *See table(s) above for measurements and observations.    Preliminary    ECHO TEE  Result Date: 09/04/2022    TRANSESOPHOGEAL ECHO REPORT   Patient Name:   KAIYU MIRABAL Date of Exam: 09/04/2022 Medical Rec #:  [id number]        Height:       76.0 in Accession #:    [id number]       Weight:       220.0 lb Date of Birth:  1945-12-15         BSA:          2.307 m Patient Age:    76 years         BP:           117/53 mmHg Patient Gender: M                HR:           84 bpm. Exam Location:  Inpatient Procedure: Transesophageal Echo, Cardiac Doppler and Color Doppler Indications:    Atrial flutter  History:        Patient has prior history of Echocardiogram examinations, most                 recent 09/03/2022. Arrythmias:LBBB; Risk Factors:Hypertension and                 Dyslipidemia. CKD, Colon CA.  Sonographer:    Milda Smart Referring Phys: [id number] CALLIE E GOODRICH PROCEDURE: After discussion of the risks and benefits of a TEE, an informed consent was obtained from the patient. TEE procedure time was 9 minutes. The transesophogeal probe was passed without difficulty through the esophogus of the patient. Imaged were  obtained with the patient in a left  lateral decubitus position. Sedation performed by different physician. The patient was monitored while under deep sedation. Anesthestetic sedation was provided intravenously by Anesthesiology: 260mg  of Propofol. Image  quality was excellent. The patient's vital signs; including heart rate, blood pressure, and oxygen saturation; remained stable throughout the procedure. The patient developed no complications during the procedure. A successful direct current cardioversion was performed at 200 joules with 1 attempt.  IMPRESSIONS  1. No left atrial/left atrial appendage thrombus was detected. The LAA emptying  velocity was 62 cm/s.  2. Aortic dilatation noted. There is mild dilatation of the aortic root, measuring 42 mm. There is mild dilatation of the ascending aorta, measuring 42 mm. 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  3. Left ventricular ejection fraction, by estimation, is 55 to 60%. The left ventricle has normal function.  4. Right ventricular systolic function is normal. The right ventricular size is normal.  5. The mitral valve is grossly normal. Mild mitral valve regurgitation. No evidence of mitral stenosis.  6. The aortic valve is tricuspid. Aortic valve regurgitation is not visualized. No aortic stenosis is present. Conclusion(s)/Recommendation(s): No LA/LAA thrombus identified. Successful cardioversion performed with restoration of normal sinus rhythm. FINDINGS  Left Ventricle: Left ventricular ejection fraction, by estimation, is 55 to 60%. The left ventricle has normal function. The left ventricular internal cavity size was normal in size. Right Ventricle: The right ventricular size is normal. No increase in right ventricular wall thickness. Right ventricular systolic function is normal. Left Atrium: Left atrial size was normal in size. No left atrial/left atrial appendage thrombus was detected. The LAA emptying velocity was 62 cm/s. Right Atrium: Right atrial size was normal in size. Pericardium: There is no evidence of pericardial effusion. Mitral Valve: The mitral valve is grossly normal. Mild mitral valve regurgitation. No evidence of mitral valve stenosis. Tricuspid Valve: The tricuspid valve is grossly normal. Tricuspid valve regurgitation is mild . No evidence of tricuspid stenosis. Aortic Valve: The aortic valve is tricuspid. Aortic valve regurgitation is not visualized. No aortic stenosis is present. Pulmonic Valve: The pulmonic valve was grossly normal. Pulmonic valve regurgitation is trivial. No evidence of pulmonic stenosis. Aorta: Aortic dilatation noted.  There is mild dilatation of the aortic root, measuring 42 mm. There is mild dilatation of the ascending aorta, measuring 42 mm. There is severe (Grade IV) protruding plaque involving the aortic arch. Venous: The left upper pulmonary vein, left lower pulmonary vein, right upper pulmonary vein and right lower pulmonary vein are normal. IAS/Shunts: The atrial septum is grossly normal. Additional Comments: Spectral Doppler performed.  AORTA Ao Root diam: 4.20 cm Ao Asc diam:  4.27 cm Lennie Odor MD Electronically signed by Lennie Odor MD Signature Date/Time: 09/04/2022/1:05:41 PM    Final    EP STUDY  Result Date: 09/04/2022 See surgical note for result.  ECHOCARDIOGRAM COMPLETE  Result Date: 09/03/2022    ECHOCARDIOGRAM REPORT   Patient Name:   MONTRAVIOUS WEIGELT Date of Exam: 09/03/2022 Medical Rec #:  [id number]        Height:       76.0 in Accession #:    [id number]       Weight:       220.0 lb Date of Birth:  12/24/1945         BSA:          2.307 m Patient Age:    76 years         BP:  131/69 mmHg Patient Gender: M                HR:           97 bpm. Exam Location:  Inpatient Procedure: 2D Echo, Cardiac Doppler and Color Doppler Indications:    I48.91* Unspeicified atrial fibrillation  History:        Patient has prior history of Echocardiogram examinations.                 Arrythmias:LBBB; Risk Factors:Hypertension and Dyslipidemia.  Sonographer:    Mike Gip Referring Phys: Therisa Doyne IMPRESSIONS  1. Left ventricular ejection fraction, by estimation, is 55 to 60%. The left ventricle has normal function. The left ventricle has no regional wall motion abnormalities. There is severe asymmetric left ventricular hypertrophy of the basal-septal segment. Left ventricular diastolic parameters are indeterminate.  2. Right ventricular systolic function is hyperdynamic. The right ventricular size is normal. Tricuspid regurgitation signal is inadequate for assessing PA pressure.  3. The mitral valve  was not well visualized. No evidence of mitral valve regurgitation. No evidence of mitral stenosis.  4. Abnormal calcification of the non coronary cusp leaflet. The aortic valve is abnormal. Aortic valve regurgitation is not visualized. No aortic stenosis is present.  5. Aortic dilatation noted. There is mild dilatation of the aortic root, measuring 43 mm. There is mild dilatation of the ascending aorta, measuring 44 mm.  6. The inferior vena cava is dilated in size with >50% respiratory variability, suggesting right atrial pressure of 8 mmHg. Comparison(s): No significant change from prior study. FINDINGS  Left Ventricle: Left ventricular ejection fraction, by estimation, is 55 to 60%. The left ventricle has normal function. The left ventricle has no regional wall motion abnormalities. The left ventricular internal cavity size was normal in size. There is  severe asymmetric left ventricular hypertrophy of the basal-septal segment. Abnormal (paradoxical) septal motion, consistent with left bundle branch block. Left ventricular diastolic parameters are indeterminate. Right Ventricle: The right ventricular size is normal. No increase in right ventricular wall thickness. Right ventricular systolic function is hyperdynamic. Tricuspid regurgitation signal is inadequate for assessing PA pressure. Left Atrium: Left atrial size was normal in size. Right Atrium: Right atrial size was normal in size. Pericardium: There is no evidence of pericardial effusion. Mitral Valve: The mitral valve was not well visualized. No evidence of mitral valve regurgitation. No evidence of mitral valve stenosis. Tricuspid Valve: The tricuspid valve is grossly normal. Tricuspid valve regurgitation is not demonstrated. No evidence of tricuspid stenosis. Aortic Valve: Abnormal calcification of the non coronary cusp leaflet. The aortic valve is abnormal. Aortic valve regurgitation is not visualized. No aortic stenosis is present. Pulmonic Valve: The  pulmonic valve was not well visualized. Pulmonic valve regurgitation is not visualized. No evidence of pulmonic stenosis. Aorta: Aortic dilatation noted. There is mild dilatation of the aortic root, measuring 43 mm. There is mild dilatation of the ascending aorta, measuring 44 mm. Venous: The inferior vena cava is dilated in size with greater than 50% respiratory variability, suggesting right atrial pressure of 8 mmHg. IAS/Shunts: No atrial level shunt detected by color flow Doppler.  LEFT VENTRICLE PLAX 2D LVIDd:         4.70 cm      Diastology LVIDs:         3.30 cm      LV e' medial:    12.20 cm/s LV PW:         1.60 cm  LV E/e' medial:  8.1 LV IVS:        2.00 cm      LV e' lateral:   13.90 cm/s LVOT diam:     2.40 cm      LV E/e' lateral: 7.1 LV SV:         102 LV SV Index:   44 LVOT Area:     4.52 cm  LV Volumes (MOD) LV vol d, MOD A2C: 162.0 ml LV vol d, MOD A4C: 120.0 ml LV vol s, MOD A2C: 64.8 ml LV vol s, MOD A4C: 51.9 ml LV SV MOD A2C:     97.2 ml LV SV MOD A4C:     120.0 ml LV SV MOD BP:      93.5 ml RIGHT VENTRICLE             IVC RV Basal diam:  4.00 cm     IVC diam: 2.30 cm RV S prime:     13.50 cm/s TAPSE (M-mode): 2.1 cm LEFT ATRIUM             Index        RIGHT ATRIUM           Index LA diam:        4.10 cm 1.78 cm/m   RA Area:     16.80 cm LA Vol (A2C):   84.3 ml 36.54 ml/m  RA Volume:   39.20 ml  16.99 ml/m LA Vol (A4C):   61.9 ml 26.83 ml/m LA Biplane Vol: 72.3 ml 31.34 ml/m  AORTIC VALVE LVOT Vmax:   122.00 cm/s LVOT Vmean:  84.600 cm/s LVOT VTI:    0.225 m  AORTA Ao Root diam: 4.30 cm Ao Asc diam:  4.40 cm MITRAL VALVE MV Area (PHT): 4.46 cm    SHUNTS MV Decel Time: 170 msec    Systemic VTI:  0.22 m MV E velocity: 99.20 cm/s  Systemic Diam: 2.40 cm MV A velocity: 40.40 cm/s MV E/A ratio:  2.46 Riley Lam MD Electronically signed by Riley Lam MD Signature Date/Time: 09/03/2022/4:21:49 PM    Final    NM Pulmonary Perfusion  Result Date: 09/03/2022 CLINICAL  DATA:  Stage IV colon cancer, tachycardia, shortness of breath, question pulmonary embolism EXAM: NUCLEAR MEDICINE PERFUSION LUNG SCAN TECHNIQUE: Perfusion images were obtained in multiple projections after intravenous injection of radiopharmaceutical. Ventilation scans intentionally deferred if perfusion scan and chest x-ray adequate for interpretation during COVID 19 epidemic. RADIOPHARMACEUTICALS:  4.1 mCi Tc-39m MAA IV COMPARISON:  Chest radiograph 09/02/2022 FINDINGS: Normal perfusion lung scan. No perfusion defects. Residual tracer within RIGHT Port-A-Cath/tubing. IMPRESSION: Normal perfusion lung scan. Electronically Signed   By: Ulyses Southward M.D.   On: 09/03/2022 11:30   DG Chest Portable 1 View  Result Date: 09/02/2022 CLINICAL DATA:  Shortness of breath EXAM: PORTABLE CHEST 1 VIEW COMPARISON:  None Available. FINDINGS: No pleural effusion. No pneumothorax. No focal airspace opacity. Cardiomegaly. No radiographically apparent displaced rib fractures. Visualized upper abdomen is unremarkable. IMPRESSION: Cardiomegaly.  No pleural effusion or significant pulmonary edema. Electronically Signed   By: Lorenza Cambridge M.D.   On: 09/02/2022 12:16    Microbiology: Results for orders placed or performed in visit on 04/16/22  Urine Culture     Status: None   Collection Time: 04/16/22 10:40 AM   Specimen: Urine, Clean Catch  Result Value Ref Range Status   Specimen Description   Final    URINE, CLEAN CATCH Performed at [facility], [address], [place], [place] [address]    Special Requests   Final    NONE Performed at [facility], [address] [place], [place] [address]    Culture   Final    NO GROWTH Performed at [facility], [address] [place], [place] [address]    Report Status 04/17/2022 FINAL  Final    Labs: CBC: Recent Labs  Lab 09/02/22 1144 09/02/22 2210 09/03/22 1023  WBC 8.1 9.0 9.1  NEUTROABS  --  6.5  --   HGB  9.9* 9.2* 9.7*  HCT 32.1* 29.8* 30.8*  MCV 87.5 86.9 86.0  PLT 206 218 217   Basic Metabolic Panel: Recent Labs  Lab 09/02/22 1144 09/02/22 2210 09/03/22 0647 09/04/22 0336  NA 136 136 137 136  K 4.9 4.4 4.4 4.4  CL 111 112* 110 110  CO2 15* 17* 16* 18*  GLUCOSE 97 130* 124* 121*  BUN 38* 33* 29* 30*  CREATININE 2.27* 2.42* 2.26* 2.51*  CALCIUM 8.8* 8.4* 8.3* 8.3*  MG  --  1.3* 2.2  --   PHOS  --  2.8 2.6  --    Liver Function Tests: Recent Labs  Lab 09/02/22 1144 09/02/22 2210 09/03/22 0647 09/04/22 0336  AST 7* 9* 10* 10*  ALT 5 9 10 11   ALKPHOS 106 93 96 85  BILITOT 0.4 0.6 0.5 0.4  PROT 6.5 6.0* 6.0* 5.9*  ALBUMIN 3.5 2.7* 2.7* 2.5*   CBG: No results for input(s): "GLUCAP" in the last 168 hours.  Discharge time spent: approximately 35 minutes spent on discharge counseling, evaluation of patient on day of discharge, and coordination of discharge planning with nursing, social work, pharmacy and case management  Signed: Alberteen Sam, MD [facility] 09/04/2022

## 2022-09-04 NOTE — Anesthesia Postprocedure Evaluation (Signed)
Anesthesia Post Note  Patient: Louis [name]  Procedure(s) Performed: TRANSESOPHAGEAL ECHOCARDIOGRAM CARDIOVERSION     Patient location during evaluation: PACU Anesthesia Type: General Level of consciousness: awake and alert Pain management: pain level controlled Vital Signs Assessment: post-procedure vital signs reviewed and stable Respiratory status: spontaneous breathing, nonlabored ventilation, respiratory function stable and patient connected to nasal cannula oxygen Cardiovascular status: blood pressure returned to baseline and stable Postop Assessment: no apparent nausea or vomiting Anesthetic complications: no  No notable events documented.  Last Vitals:  Vitals:   09/04/22 1210 09/04/22 1215  BP: 128/75 127/76  Pulse: 75 78  Resp: 17 20  Temp:    SpO2: 95% 97%    Last Pain:  Vitals:   09/04/22 1150  TempSrc: Temporal  PainSc: Asleep                 Darshan Solanki S

## 2022-09-04 NOTE — Progress Notes (Signed)
IP PROGRESS NOTE  Subjective:   Louis [name] is well-known to me with a history of metastatic colon cancer.  He is currently being treated with panitumumab and encorafenib.  He presented to the emergency room 09/02/2022 with dyspnea and tachycardia.  An EKG revealed atrial flutter he was started on a Cardizem drip and heparin.  He reports feeling better.  He has a skin rash.  No other complaint.   Objective: Vital signs in last 24 hours: Blood pressure 131/71, pulse 76, temperature 99.9 F (37.7 C), temperature source Temporal, resp. rate (!) 21, height 6\' 4"  (1.93 m), weight 220 lb (99.8 kg), SpO2 96 %.  Intake/Output from previous day: 05/01 0701 - 05/02 0700 In: 152.2 [I.V.:152.2] Out: -   Physical Exam:  HEENT: No thrush Lungs: Clear bilaterally Cardiac: Regular rhythm with premature beats Abdomen: No hepatosplenomegaly, nontender Extremities: No leg edema Skin: Acne type rash over the face and trunk  Portacath/PICC-without erythema  Lab Results: Recent Labs    09/02/22 2210 09/03/22 1023  WBC 9.0 9.1  HGB 9.2* 9.7*  HCT 29.8* 30.8*  PLT 218 217    BMET Recent Labs    09/03/22 0647 09/04/22 0336  NA 137 136  K 4.4 4.4  CL 110 110  CO2 16* 18*  GLUCOSE 124* 121*  BUN 29* 30*  CREATININE 2.26* 2.51*  CALCIUM 8.3* 8.3*    Lab Results  Component Value Date   CEA1 2.61 01/02/2021   CEA 1.41 08/12/2022    Studies/Results: EP STUDY  Result Date: 09/04/2022 See surgical note for result.  ECHOCARDIOGRAM COMPLETE  Result Date: 09/03/2022    ECHOCARDIOGRAM REPORT   Patient Name:   Louis [name] Date of Exam: 09/03/2022 Medical Rec #:  [id number]        Height:       76.0 in Accession #:    [id number]       Weight:       220.0 lb Date of Birth:  1946-01-04         BSA:          2.307 m Patient Age:    76 years         BP:           131/69 mmHg Patient Gender: M                HR:           97 bpm. Exam Location:  Inpatient Procedure: 2D Echo, Cardiac Doppler and  Color Doppler Indications:    I48.91* Unspeicified atrial fibrillation  History:        Patient has prior history of Echocardiogram examinations.                 Arrythmias:LBBB; Risk Factors:Hypertension and Dyslipidemia.  Sonographer:    Mike Gip Referring Phys: Therisa Doyne IMPRESSIONS  1. Left ventricular ejection fraction, by estimation, is 55 to 60%. The left ventricle has normal function. The left ventricle has no regional wall motion abnormalities. There is severe asymmetric left ventricular hypertrophy of the basal-septal segment. Left ventricular diastolic parameters are indeterminate.  2. Right ventricular systolic function is hyperdynamic. The right ventricular size is normal. Tricuspid regurgitation signal is inadequate for assessing PA pressure.  3. The mitral valve was not well visualized. No evidence of mitral valve regurgitation. No evidence of mitral stenosis.  4. Abnormal calcification of the non coronary cusp leaflet. The aortic valve is abnormal. Aortic valve regurgitation is not visualized. No  aortic stenosis is present.  5. Aortic dilatation noted. There is mild dilatation of the aortic root, measuring 43 mm. There is mild dilatation of the ascending aorta, measuring 44 mm.  6. The inferior vena cava is dilated in size with >50% respiratory variability, suggesting right atrial pressure of 8 mmHg. Comparison(s): No significant change from prior study. FINDINGS  Left Ventricle: Left ventricular ejection fraction, by estimation, is 55 to 60%. The left ventricle has normal function. The left ventricle has no regional wall motion abnormalities. The left ventricular internal cavity size was normal in size. There is  severe asymmetric left ventricular hypertrophy of the basal-septal segment. Abnormal (paradoxical) septal motion, consistent with left bundle branch block. Left ventricular diastolic parameters are indeterminate. Right Ventricle: The right ventricular size is normal. No  increase in right ventricular wall thickness. Right ventricular systolic function is hyperdynamic. Tricuspid regurgitation signal is inadequate for assessing PA pressure. Left Atrium: Left atrial size was normal in size. Right Atrium: Right atrial size was normal in size. Pericardium: There is no evidence of pericardial effusion. Mitral Valve: The mitral valve was not well visualized. No evidence of mitral valve regurgitation. No evidence of mitral valve stenosis. Tricuspid Valve: The tricuspid valve is grossly normal. Tricuspid valve regurgitation is not demonstrated. No evidence of tricuspid stenosis. Aortic Valve: Abnormal calcification of the non coronary cusp leaflet. The aortic valve is abnormal. Aortic valve regurgitation is not visualized. No aortic stenosis is present. Pulmonic Valve: The pulmonic valve was not well visualized. Pulmonic valve regurgitation is not visualized. No evidence of pulmonic stenosis. Aorta: Aortic dilatation noted. There is mild dilatation of the aortic root, measuring 43 mm. There is mild dilatation of the ascending aorta, measuring 44 mm. Venous: The inferior vena cava is dilated in size with greater than 50% respiratory variability, suggesting right atrial pressure of 8 mmHg. IAS/Shunts: No atrial level shunt detected by color flow Doppler.  LEFT VENTRICLE PLAX 2D LVIDd:         4.70 cm      Diastology LVIDs:         3.30 cm      LV e' medial:    12.20 cm/s LV PW:         1.60 cm      LV E/e' medial:  8.1 LV IVS:        2.00 cm      LV e' lateral:   13.90 cm/s LVOT diam:     2.40 cm      LV E/e' lateral: 7.1 LV SV:         102 LV SV Index:   44 LVOT Area:     4.52 cm  LV Volumes (MOD) LV vol d, MOD A2C: 162.0 ml LV vol d, MOD A4C: 120.0 ml LV vol s, MOD A2C: 64.8 ml LV vol s, MOD A4C: 51.9 ml LV SV MOD A2C:     97.2 ml LV SV MOD A4C:     120.0 ml LV SV MOD BP:      93.5 ml RIGHT VENTRICLE             IVC RV Basal diam:  4.00 cm     IVC diam: 2.30 cm RV S prime:     13.50 cm/s  TAPSE (M-mode): 2.1 cm LEFT ATRIUM             Index        RIGHT ATRIUM  Index LA diam:        4.10 cm 1.78 cm/m   RA Area:     16.80 cm LA Vol (A2C):   84.3 ml 36.54 ml/m  RA Volume:   39.20 ml  16.99 ml/m LA Vol (A4C):   61.9 ml 26.83 ml/m LA Biplane Vol: 72.3 ml 31.34 ml/m  AORTIC VALVE LVOT Vmax:   122.00 cm/s LVOT Vmean:  84.600 cm/s LVOT VTI:    0.225 m  AORTA Ao Root diam: 4.30 cm Ao Asc diam:  4.40 cm MITRAL VALVE MV Area (PHT): 4.46 cm    SHUNTS MV Decel Time: 170 msec    Systemic VTI:  0.22 m MV E velocity: 99.20 cm/s  Systemic Diam: 2.40 cm MV A velocity: 40.40 cm/s MV E/A ratio:  2.46 Riley Lam MD Electronically signed by Riley Lam MD Signature Date/Time: 09/03/2022/4:21:49 PM    Final    NM Pulmonary Perfusion  Result Date: 09/03/2022 CLINICAL DATA:  Stage IV colon cancer, tachycardia, shortness of breath, question pulmonary embolism EXAM: NUCLEAR MEDICINE PERFUSION LUNG SCAN TECHNIQUE: Perfusion images were obtained in multiple projections after intravenous injection of radiopharmaceutical. Ventilation scans intentionally deferred if perfusion scan and chest x-ray adequate for interpretation during COVID 19 epidemic. RADIOPHARMACEUTICALS:  4.1 mCi Tc-49m MAA IV COMPARISON:  Chest radiograph 09/02/2022 FINDINGS: Normal perfusion lung scan. No perfusion defects. Residual tracer within RIGHT Port-A-Cath/tubing. IMPRESSION: Normal perfusion lung scan. Electronically Signed   By: Ulyses Southward M.D.   On: 09/03/2022 11:30    Medications: I have reviewed the patient's current medications.  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), isolated mesenteric implant-resected, multiple tumor deposits Sigmoid/descending resection and creation of a descending colostomy 04/10/2016 MSI-stable, no loss of mismatch repair protein expression Foundation 1- BRAF V600E positive, MS-stable, intermediate tumor mutation burden, no RAS mutation CT abdomen/pelvis  04/02/2016-no evidence of distant metastatic disease Cycle 1 FOLFOX 05/21/2016 Cycle 2 FOLFOX 06/04/2016 Cycle 3 FOLFOX 06/18/2016 Cycle 4 FOLFOX 07/02/2016 (oxaliplatin held secondary to thrombocytopenia) Cycle 5 FOLFOX 07/16/2016 Cycle 6 FOLFOX 07/30/2016 Cycle 7 FOLFOX 08/13/2016 (oxaliplatin held and 5-FU dose reduced) Cycle 8 FOLFOX 09/03/2016 (oxaliplatin held secondary to neuropathy) Cycle 9 FOLFOX 09/17/2016 (oxaliplatin held secondary to neuropathy) Cycle 10 FOLFOX 10/02/2016 Cycle 11 FOLFOX 10/15/2016 (oxaliplatin eliminated from the regimen) Cycle 12 FOLFOX 10/30/2016 (oxaliplatin eliminated) CTs 05/12/2017-no evidence of recurrent disease, right inguinal hernia containing bladder Colonoscopy 07/21/2017, 3 polyps were removed from the descending and transverse colon, fragments of tubular and tubulovillous adenoma CTs 05/19/2018-no evidence of recurrent disease, status post hernia repair, right upper lobe pneumonia CTs 05/20/2019-resolution of right upper lobe pneumonia, no evidence of metastatic disease Colonoscopy 01/25/2020-multiple polyps removed-tubular adenomas, poor preparation, repeat colonoscopy recommended CTs neck, chest, and abdomen/pelvis 08/27/2020- new 2 centimeter left axillary node, new 1.9 cm left inguinal node chronic mildly prominent portacaval node Ultrasound biopsy of left inguinal node on 10/29/2020-metastatic adenocarcinoma consistent with colon adenocarcinoma PET 11/12/2020-enlarged hypermetabolic left inguinal and left axillary nodes, solitary focus of hypermetabolic activity in the right lower quadrant small bowel Cycle 1 FOLFOX 12/19/2020 Cycle 2 FOLFOX 01/02/2021, oxaliplatin dose reduced secondary to neutropenia and thrombocytopenia Cycle 3 FOLFOX 01/16/2021 Cycle 4 FOLFOX 01/30/2021 CTs 02/11/2021-no change in left inguinal and left axillary nodes, no evidence of disease progression, stable wall thickening involving a loop of distal ileum Cycle 1 FOLFIRI/Avastin  02/27/2021 Cycle 2 FOLFIRI/Avastin 03/13/2021 Cycle 3 FOLFIRI/Avastin 04/03/2021 Cycle 4 FOLFIRI 04/17/2021, Avastin held due to elevated urine protein 05/07/2021-24-hour urine protein elevated 1562 Cycle 5 FOLFIRI  05/08/2021, Avastin held due to elevated urine protein, irinotecan dose reduced due to in general not feeling well after treatment CTs 05/17/2021-left axillary and left inguinal lymph nodes are smaller, no evidence of disease progression Cycle 6 FOLFIRI 05/22/2021, Avastin held due to proteinuria 06/03/2021 24-hour urine with 1.9 g of protein Cycle 7 FOLFIRI 06/12/2021, Avastin held due to proteinuria Cycle 8 FOLFIRI 07/03/2021, Avastin held due to proteinuria Cycle 9 FOLFIRI 07/24/2021, Avastin held due to proteinuria Cycle 10 FOLFIRI 08/19/2021, Avastin held due to proteinuria CTs 09/02/2021-no change in the left axillary, left inguinal, and portacaval lymph nodes.  No mention of the distal small bowel thickening Maintenance 5-fluorouracil 09/18/2021 CT abdomen/pelvis 10/28/2021-enlarged left inguinal lymph node, stable; stomach and small bowel grossly unremarkable. Maintenance 5-fluorouracil continued Colonoscopy 12/10/2021-terminal ileum with an ulcerated partially obstructing medium-sized mass approximately 15 to 20 cm from the IC valve, invasive adenocarcinoma, colonic type, moderately differentiated (grade 2) PET scan 12/18/2021-progression of disease in the peritoneal space.  2 intensely hypermetabolic nodes in the left axilla, one node is new from the prior, the other is reduced in size and metabolic activity; decrease in size and metabolic activity of left inguinal adenopathy; intense metabolic activity associate with a loop of small bowel in the right lower quadrant, no interval change; new small hypermetabolic peritoneal implant along the ventral peritoneal surface just right of midline; larger new peritoneal implant in the deep right pelvis; small new implant in the right mid  abdomen Encorafenib/Panitumumab 01/08/2022 CTs 03/17/2022-decrease size of mesenteric implant at the posterior bladder wall, small mesenteric peritoneal lesions identified on PET/CT or not evident.  Decrease size of left inguinal left axillary nodes, hypermetabolic activity noted at the anterior left prostate on comparison August 2023 PET CTs 07/21/2022-decrease size of a left axillary lymph node and right pelvic implant.  Left inguinal lymph node measures smaller.  No new sites of disease identified.  Similar tiny pulmonary nodules. Encorafenib and Panitumumab continued, Panitumumab changed from a 2-week schedule to a 3-week schedule 07/23/2022   2.   Chronic renal insufficiency   3.   Hypertension   4.  Inflamed sebaceous cyst at the upper back-status post incision and drainage   5.  Thrombocytopenia secondary chemotherapy-oxaliplatin dose reduced beginning with cycle 3 FOLFOX, oxaliplatin held with cycle 4 and cycle 7 FOLFOX   6.  Oxaliplatin neuropathy-improved   7.  History of Mucositis secondary chemotherapy   8.  Symptoms of pneumonia January 2020-CT 05/19/2018 consistent with right upper lobe pneumonia, Levaquin prescribed 05/20/2018   9.  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  Anemia, progressive 10/29/2021 2 units packed red blood cells 10/31/2021 Ferritin low 10/29/2021-oral iron Ferritin low 01/22/2022   12.  Colonoscopy 12/10/2021-terminal ileum with an ulcerated partially obstructing medium-sized mass approximately 15 to 20 cm from the IC valve, invasive adenocarcinoma, colonic type, moderately differentiated (grade 2) 13.  Admission 09/02/2022 with atrial flutter and rapid ventricular response   Mr Oats has metastatic colon cancer.  He is currently being treated with encorafenib and every 3-week panitumumab.  Restaging CTs on 07/21/2022 revealed no evidence of disease progression.  The plan is to continue encorafenib and  panitumumab.  He can resume encorafenib and will return for panitumumab as scheduled on 09/24/2022.  The metastatic colon cancer has responded to the current systemic regimen. He is now admitted with atrial flutter and rapid ventricular response.  He is scheduled undergo cardioversion by cardiology today.  He  has chronic renal failure.  The hypomagnesemia is chronic and related to panitumumab.  He receives an IV magnesium supplementation.  Recommendations: Resume encorafenib Follow-up as scheduled at the cancer center for an office visit and panitumumab 09/24/2022 Consult pharmacy to consider anticoagulation options in the setting of chronic renal failure and encorafenib Please call oncology as needed  LOS: 1 day   Thornton Papas, MD   09/04/2022, 12:06 PM

## 2022-09-04 NOTE — Plan of Care (Signed)
  Problem: Education: Goal: Knowledge of General Education information will improve Description: Including pain rating scale, medication(s)/side effects and non-pharmacologic comfort measures Outcome: Adequate for Discharge   Problem: Health Behavior/Discharge Planning: Goal: Ability to manage health-related needs will improve Outcome: Adequate for Discharge   Problem: Clinical Measurements: Goal: Ability to maintain clinical measurements within normal limits will improve Outcome: Adequate for Discharge Goal: Will remain free from infection Outcome: Adequate for Discharge Goal: Diagnostic test results will improve Outcome: Adequate for Discharge Goal: Respiratory complications will improve Outcome: Adequate for Discharge Goal: Cardiovascular complication will be avoided Outcome: Adequate for Discharge   Problem: Activity: Goal: Risk for activity intolerance will decrease Outcome: Adequate for Discharge   Problem: Nutrition: Goal: Adequate nutrition will be maintained Outcome: Adequate for Discharge   Problem: Coping: Goal: Level of anxiety will decrease Outcome: Adequate for Discharge   Problem: Elimination: Goal: Will not experience complications related to bowel motility Outcome: Adequate for Discharge Goal: Will not experience complications related to urinary retention Outcome: Adequate for Discharge   Problem: Pain Managment: Goal: General experience of comfort will improve Outcome: Adequate for Discharge   Problem: Safety: Goal: Ability to remain free from injury will improve Outcome: Adequate for Discharge   Problem: Skin Integrity: Goal: Risk for impaired skin integrity will decrease Outcome: Adequate for Discharge   Problem: Education: Goal: Knowledge of disease or condition will improve Outcome: Adequate for Discharge Goal: Understanding of medication regimen will improve Outcome: Adequate for Discharge Goal: Individualized Educational  Video(s) Outcome: Adequate for Discharge   Problem: Activity: Goal: Ability to tolerate increased activity will improve Outcome: Adequate for Discharge   Problem: Cardiac: Goal: Ability to achieve and maintain adequate cardiopulmonary perfusion will improve Outcome: Adequate for Discharge   Problem: Health Behavior/Discharge Planning: Goal: Ability to safely manage health-related needs after discharge will improve Outcome: Adequate for Discharge   Problem: Acute Rehab PT Goals(only PT should resolve) Goal: Pt Will Ambulate Outcome: Adequate for Discharge Goal: Pt Will Go Up/Down Stairs Outcome: Adequate for Discharge

## 2022-09-04 NOTE — Progress Notes (Signed)
Heart Failure Navigator Progress Note  Assessed for Heart & Vascular TOC clinic readiness.  Patient EF 55-60%, No TOC per note patient with Metastatic Colon cancer.   Navigator will sign off at this time.   Rhae Hammock, BSN, Scientist, clinical (histocompatibility and immunogenetics) Only

## 2022-09-04 NOTE — Transfer of Care (Signed)
Immediate Anesthesia Transfer of Care Note  Patient: Louis [name]  Procedure(s) Performed: TRANSESOPHAGEAL ECHOCARDIOGRAM CARDIOVERSION  Patient Location: Cath Lab  Anesthesia Type:MAC  Level of Consciousness: drowsy  Airway & Oxygen Therapy: Patient Spontanous Breathing  Post-op Assessment: Report given to RN and Post -op Vital signs reviewed and stable  Post vital signs: Reviewed and stable  Last Vitals:  Vitals Value Taken Time  BP 123/73 09/04/22 1150  Temp 37.7 C 09/04/22 1150  Pulse 80 09/04/22 1151  Resp 20 09/04/22 1151  SpO2 93 % 09/04/22 1151  Vitals shown include unvalidated device data.  Last Pain:  Vitals:   09/04/22 1150  TempSrc: Temporal  PainSc: Asleep         Complications: No notable events documented.

## 2022-09-05 ENCOUNTER — Encounter (HOSPITAL_COMMUNITY): Payer: Self-pay | Admitting: Cardiovascular Disease

## 2022-09-05 ENCOUNTER — Other Ambulatory Visit: Payer: Self-pay

## 2022-09-09 ENCOUNTER — Telehealth: Payer: Self-pay | Admitting: Cardiology

## 2022-09-09 NOTE — Telephone Encounter (Signed)
New Message:     This pt was seen in the hospital by Dr Shari Prows.  He would like to switch from Dr Shari Prows to Dr Duke Salvia please. Is this okay with you both?

## 2022-09-10 ENCOUNTER — Other Ambulatory Visit: Payer: Self-pay

## 2022-09-11 ENCOUNTER — Ambulatory Visit (HOSPITAL_COMMUNITY): Payer: Medicare Other | Admitting: Internal Medicine

## 2022-09-12 ENCOUNTER — Encounter (HOSPITAL_BASED_OUTPATIENT_CLINIC_OR_DEPARTMENT_OTHER): Payer: Self-pay | Admitting: *Deleted

## 2022-09-12 ENCOUNTER — Emergency Department (HOSPITAL_BASED_OUTPATIENT_CLINIC_OR_DEPARTMENT_OTHER)
Admission: EM | Admit: 2022-09-12 | Discharge: 2022-09-12 | Disposition: A | Discharge status: Home / Self Care | Payer: Medicare Other | Attending: Emergency Medicine | Admitting: Emergency Medicine

## 2022-09-12 ENCOUNTER — Other Ambulatory Visit: Payer: Self-pay

## 2022-09-12 ENCOUNTER — Telehealth: Payer: Self-pay | Admitting: *Deleted

## 2022-09-12 ENCOUNTER — Other Ambulatory Visit: Payer: Self-pay | Admitting: Physician Assistant

## 2022-09-12 ENCOUNTER — Emergency Department (HOSPITAL_BASED_OUTPATIENT_CLINIC_OR_DEPARTMENT_OTHER): Payer: Medicare Other

## 2022-09-12 ENCOUNTER — Other Ambulatory Visit (HOSPITAL_BASED_OUTPATIENT_CLINIC_OR_DEPARTMENT_OTHER): Payer: Self-pay

## 2022-09-12 ENCOUNTER — Other Ambulatory Visit: Payer: Self-pay | Admitting: Nurse Practitioner

## 2022-09-12 DIAGNOSIS — N390 Urinary tract infection, site not specified: Secondary | ICD-10-CM

## 2022-09-12 DIAGNOSIS — I483 Typical atrial flutter: Secondary | ICD-10-CM | POA: Insufficient documentation

## 2022-09-12 DIAGNOSIS — R000 Tachycardia, unspecified: Secondary | ICD-10-CM | POA: Diagnosis not present

## 2022-09-12 DIAGNOSIS — R002 Palpitations: Secondary | ICD-10-CM | POA: Diagnosis present

## 2022-09-12 DIAGNOSIS — I4891 Unspecified atrial fibrillation: Secondary | ICD-10-CM

## 2022-09-12 LAB — URINALYSIS, ROUTINE W REFLEX MICROSCOPIC
Bacteria, UA: NONE SEEN
Bilirubin Urine: NEGATIVE
Glucose, UA: NEGATIVE mg/dL
Ketones, ur: NEGATIVE mg/dL
Nitrite: NEGATIVE
Protein, ur: 30 mg/dL — AB
Specific Gravity, Urine: 1.013 (ref 1.005–1.030)
WBC, UA: >50 WBC/hpf (ref 0–5)
pH: 6.5 (ref 5.0–8.0)

## 2022-09-12 LAB — CBC WITH DIFFERENTIAL/PLATELET
Abs Immature Granulocytes: 0.04 10*3/uL (ref 0.00–0.07)
Basophils Absolute: 0.1 10*3/uL (ref 0.0–0.1)
Basophils Relative: 1 %
Eosinophils Absolute: 0.2 10*3/uL (ref 0.0–0.5)
Eosinophils Relative: 3 %
HCT: 32 % — ABNORMAL LOW (ref 39.0–52.0)
Hemoglobin: 9.7 g/dL — ABNORMAL LOW (ref 13.0–17.0)
Immature Granulocytes: 1 %
Lymphocytes Relative: 13 %
Lymphs Abs: 1 10*3/uL (ref 0.7–4.0)
MCH: 26.4 pg (ref 26.0–34.0)
MCHC: 30.3 g/dL (ref 30.0–36.0)
MCV: 87.2 fL (ref 80.0–100.0)
Monocytes Absolute: 1 10*3/uL (ref 0.1–1.0)
Monocytes Relative: 12 %
Neutro Abs: 5.8 10*3/uL (ref 1.7–7.7)
Neutrophils Relative %: 70 %
Platelets: 241 10*3/uL (ref 150–400)
RBC: 3.67 MIL/uL — ABNORMAL LOW (ref 4.22–5.81)
RDW: 17.1 % — ABNORMAL HIGH (ref 11.5–15.5)
WBC: 8.1 10*3/uL (ref 4.0–10.5)
nRBC: 0 % (ref 0.0–0.2)

## 2022-09-12 LAB — TROPONIN I (HIGH SENSITIVITY): Troponin I (High Sensitivity): 8 ng/L (ref <18)

## 2022-09-12 LAB — BASIC METABOLIC PANEL
Anion gap: 10 (ref 5–15)
BUN: 37 mg/dL — ABNORMAL HIGH (ref 8–23)
CO2: 18 mmol/L — ABNORMAL LOW (ref 22–32)
Calcium: 8.6 mg/dL — ABNORMAL LOW (ref 8.9–10.3)
Chloride: 112 mmol/L — ABNORMAL HIGH (ref 98–111)
Creatinine, Ser: 2.65 mg/dL — ABNORMAL HIGH (ref 0.61–1.24)
GFR, Estimated: 24 mL/min — ABNORMAL LOW (ref >60)
Glucose, Bld: 109 mg/dL — ABNORMAL HIGH (ref 70–99)
Potassium: 4.9 mmol/L (ref 3.5–5.1)
Sodium: 140 mmol/L (ref 135–145)

## 2022-09-12 LAB — LIPASE, BLOOD: Lipase: 34 U/L (ref 11–51)

## 2022-09-12 LAB — MAGNESIUM: Magnesium: 2 mg/dL (ref 1.7–2.4)

## 2022-09-12 MED ORDER — ETOMIDATE 2 MG/ML IV SOLN
20.0000 mg | Freq: Once | INTRAVENOUS | Status: AC
  Administered 2022-09-12: 20 mg via INTRAVENOUS
  Filled 2022-09-12: qty 10

## 2022-09-12 MED ORDER — HEPARIN SOD (PORK) LOCK FLUSH 100 UNIT/ML IV SOLN
500.0000 [IU] | INTRAVENOUS | Status: DC | PRN
  Filled 2022-09-12: qty 5

## 2022-09-12 MED ORDER — LACTATED RINGERS IV BOLUS
2000.0000 mL | Freq: Once | INTRAVENOUS | Status: AC
  Administered 2022-09-12: 2000 mL via INTRAVENOUS

## 2022-09-12 MED ORDER — SULFAMETHOXAZOLE-TRIMETHOPRIM 400-80 MG PO TABS
1.0000 | ORAL_TABLET | Freq: Two times a day (BID) | ORAL | 0 refills | Status: AC
Start: 2022-09-12 — End: 2022-09-19
  Filled 2022-09-12: qty 14, 7d supply, fill #0

## 2022-09-12 NOTE — ED Notes (Addendum)
Patient placed on an ETCo2 nasal cannula and an Ambu bag at the head of the bed for monitoring while patient is cardioverted. Currently patient oxygen saturation is 100% on the 2L with a etco2 of 29

## 2022-09-12 NOTE — ED Notes (Signed)
Per [place] MD, pt to be cardioverted

## 2022-09-12 NOTE — ED Notes (Signed)
RN reviewed discharge instructions with pt. Pt verbalized understanding and had no further questions. VSS upon discharge. ?

## 2022-09-12 NOTE — Sedation Documentation (Signed)
Unable to rate pain during procedure due to sedation 

## 2022-09-12 NOTE — Sedation Documentation (Signed)
Pt spo2 84% 2L, bagged by RT for 10 seconds, Spo2 98% 6L [place], jaw thrust maneuver performed

## 2022-09-12 NOTE — ED Notes (Signed)
Pt would like port accessed for IV and labs

## 2022-09-12 NOTE — Progress Notes (Signed)
Patient had Cardioversion without incident. Vitals are stable and patient was placed on oxygen for procedure

## 2022-09-12 NOTE — ED Provider Notes (Signed)
[place] EMERGENCY DEPARTMENT AT [facility] Provider Note   CSN: [id number] Arrival date & time: 09/12/22  1514     History Chief Complaint  Patient presents with   Weakness    HPI Louis [name] is a 77 y.o. male presenting for chief complaint of palpitations and near syncope. Patient comes in coming to the emergency department from the pharmacy on campus where he was picking up medications for suspected UTI.  Extensive medical history including malignancy, atrial flutter.  In the hospital last week for atrial flutter episode with RVR.  States that when he was walking into the pharmacy he had a near syncopal event and had to sit on a chair. Symptoms are persistent and he comes down by a code 44. Endorses severe dysuria, poor hydration, ongoing treatment for metastatic colon cancer in the outpatient setting..   Patient's recorded medical, surgical, social, medication list and allergies were reviewed in the Snapshot window as part of the initial history.   Review of Systems   Review of Systems  Constitutional:  Positive for fatigue. Negative for chills and fever.  HENT:  Negative for ear pain and sore throat.   Eyes:  Negative for pain and visual disturbance.  Respiratory:  Negative for cough and shortness of breath.   Cardiovascular:  Negative for chest pain and palpitations.  Gastrointestinal:  Negative for abdominal pain and vomiting.  Genitourinary:  Negative for dysuria and hematuria.  Musculoskeletal:  Negative for arthralgias and back pain.  Skin:  Negative for color change and rash.  Neurological:  Positive for light-headedness. Negative for seizures and syncope.  All other systems reviewed and are negative.   Physical Exam Updated Vital Signs BP (!) 153/80   Pulse 70   Temp 98.2 F (36.8 C) (Oral)   Resp 12   SpO2 100%  Physical Exam Vitals and nursing note reviewed.  Constitutional:      General: He is not in acute distress.    Appearance: He  is well-developed.  HENT:     Head: Normocephalic and atraumatic.  Eyes:     Conjunctiva/sclera: Conjunctivae normal.  Cardiovascular:     Rate and Rhythm: Normal rate and regular rhythm.     Heart sounds: No murmur heard. Pulmonary:     Effort: Pulmonary effort is normal. No respiratory distress.     Breath sounds: Normal breath sounds.  Abdominal:     Palpations: Abdomen is soft.     Tenderness: There is no abdominal tenderness.  Musculoskeletal:        General: No swelling.     Cervical back: Neck supple.  Skin:    General: Skin is warm and dry.     Capillary Refill: Capillary refill takes less than 2 seconds.  Neurological:     Mental Status: He is alert.  Psychiatric:        Mood and Affect: Mood normal.      ED Course/ Medical Decision Making/ A&P    Procedures .Cardioversion  Date/Time: 09/12/2022 7:15 PM  Performed by: Glyn Ade, MD Authorized by: Glyn Ade, MD   Consent:    Consent obtained:  Verbal and written   Consent given by:  Patient   Risks discussed:  Cutaneous burn, induced arrhythmia, pain and death   Alternatives discussed:  No treatment and rate-control medication Pre-procedure details:    Cardioversion basis:  Emergent   Rhythm:  Atrial flutter   Electrode placement:  Anterior-posterior Patient sedated: Yes. Refer to sedation procedure documentation for  details of sedation.  Attempt one:    Cardioversion mode:  Synchronous   Waveform:  Biphasic   Shock (Joules):  120   Shock outcome:  Conversion to normal sinus rhythm Post-procedure details:    Patient status:  Awake   Patient tolerance of procedure:  Tolerated well, no immediate complications .Sedation  Date/Time: 09/12/2022 7:15 PM  Performed by: Glyn Ade, MD Authorized by: Glyn Ade, MD   Consent:    Consent obtained:  Verbal and written   Consent given by:  Patient Universal protocol:    Immediately prior to procedure, a time out was called:  yes     Patient identity confirmed:  Anonymous protocol, patient vented/unresponsive Pre-sedation assessment:    Time since last food or drink:  4 hours   NPO status caution: urgency dictates proceeding with non-ideal NPO status     ASA classification: class 2 - patient with mild systemic disease     Mallampati score:  II - soft palate, uvula, fauces visible   Pre-sedation assessments completed and reviewed: airway patency, cardiovascular function, mental status, respiratory function and temperature   Procedure details (see MAR for exact dosages):    Total Provider sedation time (minutes):  25 Post-procedure details:    Post-sedation assessment completed:  09/12/2022 7:16 PM   Attendance: Constant attendance by certified staff until patient recovered     Recovery: Patient returned to pre-procedure baseline     Patient is stable for discharge or admission: yes   .Critical Care  Performed by: Glyn Ade, MD Authorized by: Glyn Ade, MD   Critical care provider statement:    Critical care time (minutes):  30   Critical care was necessary to treat or prevent imminent or life-threatening deterioration of the following conditions:  Circulatory failure and cardiac failure   Critical care was time spent personally by me on the following activities:  Development of treatment plan with patient or surrogate, discussions with consultants, evaluation of patient's response to treatment, examination of patient, ordering and review of laboratory studies, ordering and review of radiographic studies, ordering and performing treatments and interventions, pulse oximetry, re-evaluation of patient's condition and review of old charts    Medications Ordered in ED Medications  lactated ringers bolus 2,000 mL (2,000 mLs Intravenous New Bag/Given 09/12/22 1617)  etomidate (AMIDATE) injection 20 mg (20 mg Intravenous Given 09/12/22 1800)    Medical Decision Making:   Louis [name] is a 77 y.o.  male who presented to the ED today with a near syncopal episode detailed above.    Patient's presentation is complicated by their history of multiple comorbid medical problems.  Patient placed on continuous vitals and telemetry monitoring while in ED which was reviewed periodically.  Complete initial physical exam performed, notably the patient  was hemodynamically stable no acute distress..    Reviewed and confirmed nursing documentation for past medical history, family history, social history.    Initial Assessment:   With the patient's presentation of syncope, most likely diagnosis is orthostatic hypotension vs vasovagal episode. Other diagnoses were considered including (but not limited to) arrythmogenic syncope, valvular abnormality, PE, aortic dissection. These are considered less likely due to history of present illness and physical exam findings.   This is most consistent with an acute life/limb threatening illness complicated by underlying chronic conditions.  Initial Plan:  Treated with IV fluids plan for reassessment Screening labs including CBC and Metabolic panel to evaluate for infectious or metabolic etiology of disease.  Urinalysis with reflex culture  ordered to evaluate for UTI or relevant urologic/nephrologic pathology.  CXR to evaluate for structural/infectious intrathoracic pathology.  Troponin/EKG to evaluate for cardiac pathology.  Objective evaluation as below reviewed after administration of IVF/Telemetry monitoring  Initial Study Results:   Laboratory  All laboratory results reviewed without evidence of clinically relevant pathology.     EKG EKG field atrial flutter with 2-1 block recurrent from prior  Radiology:  All images reviewed independently. Agree with radiology report at this time.   DG Chest Portable 1 View  Result Date: 09/12/2022 CLINICAL DATA:  Tachycardia. EXAM: PORTABLE CHEST 1 VIEW COMPARISON:  September 02, 2022. FINDINGS: Stable cardiomediastinal  silhouette. Right-sided Port-A-Cath is unchanged in position. Both lungs are clear. The visualized skeletal structures are unremarkable. IMPRESSION: No active disease. Electronically Signed   By: Lupita Raider M.D.   On: 09/12/2022 16:28   VAS [place] LOWER EXTREMITY VENOUS (DVT)  Result Date: 09/04/2022  Lower Venous DVT Study Patient Name:  DAELYNN RODD  Date of Exam:   09/04/2022 Medical Rec #: [id number]         Accession #:    [id number] Date of Birth: [birth year]          Patient Gender: M Patient Age:   19 years Exam Location:  [facility] Procedure:      VAS [place] LOWER EXTREMITY VENOUS (DVT) Referring Phys: Jonny Ruiz DOUTOVA --------------------------------------------------------------------------------  Indications: Edema, and SOB.  Comparison Study: No prior studies. Performing Technologist: Jean Rosenthal RDMS, RVT  Examination Guidelines: A complete evaluation includes B-mode imaging, spectral Doppler, color Doppler, and power Doppler as needed of all accessible portions of each vessel. Bilateral testing is considered an integral part of a complete examination. Limited examinations for reoccurring indications may be performed as noted. The reflux portion of the exam is performed with the patient in reverse Trendelenburg.  +---------+---------------+---------+-----------+----------+--------------+ RIGHT    CompressibilityPhasicitySpontaneityPropertiesThrombus Aging +---------+---------------+---------+-----------+----------+--------------+ CFV      Full           Yes      Yes                                 +---------+---------------+---------+-----------+----------+--------------+ SFJ      Full                                                        +---------+---------------+---------+-----------+----------+--------------+ FV Prox  Full                                                        +---------+---------------+---------+-----------+----------+--------------+ FV Mid    Full                                                        +---------+---------------+---------+-----------+----------+--------------+ FV DistalFull                                                        +---------+---------------+---------+-----------+----------+--------------+  PFV      Full                                                        +---------+---------------+---------+-----------+----------+--------------+ POP      Full           Yes      Yes                                 +---------+---------------+---------+-----------+----------+--------------+ PTV      Full                                                        +---------+---------------+---------+-----------+----------+--------------+ PERO     Full                                                        +---------+---------------+---------+-----------+----------+--------------+ Gastroc  Full                                                        +---------+---------------+---------+-----------+----------+--------------+   +---------+---------------+---------+-----------+----------+--------------+ LEFT     CompressibilityPhasicitySpontaneityPropertiesThrombus Aging +---------+---------------+---------+-----------+----------+--------------+ CFV      Full           Yes      Yes                                 +---------+---------------+---------+-----------+----------+--------------+ SFJ      Full                                                        +---------+---------------+---------+-----------+----------+--------------+ FV Prox  Full                                                        +---------+---------------+---------+-----------+----------+--------------+ FV Mid   Full                                                        +---------+---------------+---------+-----------+----------+--------------+ FV DistalFull                                                         +---------+---------------+---------+-----------+----------+--------------+  PFV      Full                                                        +---------+---------------+---------+-----------+----------+--------------+ POP      Full           Yes      Yes                                 +---------+---------------+---------+-----------+----------+--------------+ PTV      Full                                                        +---------+---------------+---------+-----------+----------+--------------+ PERO     Full                                                        +---------+---------------+---------+-----------+----------+--------------+ Gastroc  Full                                                        +---------+---------------+---------+-----------+----------+--------------+     Summary: RIGHT: - There is no evidence of deep vein thrombosis in the lower extremity.  - No cystic structure found in the popliteal fossa.  LEFT: - There is no evidence of deep vein thrombosis in the lower extremity.  - No cystic structure found in the popliteal fossa.  *See table(s) above for measurements and observations. Electronically signed by Gerarda Fraction on 09/04/2022 at 5:22:08 PM.    Final    ECHO TEE  Result Date: 09/04/2022    TRANSESOPHOGEAL ECHO REPORT   Patient Name:   BART BARMES Date of Exam: 09/04/2022 Medical Rec #:  [id number]        Height:       76.0 in Accession #:    [id number]       Weight:       220.0 lb Date of Birth:  July 27, 1945         BSA:          2.307 m Patient Age:    76 years         BP:           117/53 mmHg Patient Gender: M                HR:           84 bpm. Exam Location:  Inpatient Procedure: Transesophageal Echo, Cardiac Doppler and Color Doppler Indications:    Atrial flutter  History:        Patient has prior history of Echocardiogram examinations, most                 recent 09/03/2022. Arrythmias:LBBB; Risk  Factors:Hypertension and  Dyslipidemia. CKD, Colon CA.  Sonographer:    Milda Smart Referring Phys: [id number] CALLIE E GOODRICH PROCEDURE: After discussion of the risks and benefits of a TEE, an informed consent was obtained from the patient. TEE procedure time was 9 minutes. The transesophogeal probe was passed without difficulty through the esophogus of the patient. Imaged were  obtained with the patient in a left lateral decubitus position. Sedation performed by different physician. The patient was monitored while under deep sedation. Anesthestetic sedation was provided intravenously by Anesthesiology: 260mg  of Propofol. Image  quality was excellent. The patient's vital signs; including heart rate, blood pressure, and oxygen saturation; remained stable throughout the procedure. The patient developed no complications during the procedure. A successful direct current cardioversion was performed at 200 joules with 1 attempt.  IMPRESSIONS  1. No left atrial/left atrial appendage thrombus was detected. The LAA emptying velocity was 62 cm/s.  2. Aortic dilatation noted. There is mild dilatation of the aortic root, measuring 42 mm. There is mild dilatation of the ascending aorta, measuring 42 mm. 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  3. Left ventricular ejection fraction, by estimation, is 55 to 60%. The left ventricle has normal function.  4. Right ventricular systolic function is normal. The right ventricular size is normal.  5. The mitral valve is grossly normal. Mild mitral valve regurgitation. No evidence of mitral stenosis.  6. The aortic valve is tricuspid. Aortic valve regurgitation is not visualized. No aortic stenosis is present. Conclusion(s)/Recommendation(s): No LA/LAA thrombus identified. Successful cardioversion performed with restoration of normal sinus rhythm. FINDINGS  Left Ventricle: Left ventricular ejection fraction, by estimation, is 55 to 60%.  The left ventricle has normal function. The left ventricular internal cavity size was normal in size. Right Ventricle: The right ventricular size is normal. No increase in right ventricular wall thickness. Right ventricular systolic function is normal. Left Atrium: Left atrial size was normal in size. No left atrial/left atrial appendage thrombus was detected. The LAA emptying velocity was 62 cm/s. Right Atrium: Right atrial size was normal in size. Pericardium: There is no evidence of pericardial effusion. Mitral Valve: The mitral valve is grossly normal. Mild mitral valve regurgitation. No evidence of mitral valve stenosis. Tricuspid Valve: The tricuspid valve is grossly normal. Tricuspid valve regurgitation is mild . No evidence of tricuspid stenosis. Aortic Valve: The aortic valve is tricuspid. Aortic valve regurgitation is not visualized. No aortic stenosis is present. Pulmonic Valve: The pulmonic valve was grossly normal. Pulmonic valve regurgitation is trivial. No evidence of pulmonic stenosis. Aorta: Aortic dilatation noted. There is mild dilatation of the aortic root, measuring 42 mm. There is mild dilatation of the ascending aorta, measuring 42 mm. There is severe (Grade IV) protruding plaque involving the aortic arch. Venous: The left upper pulmonary vein, left lower pulmonary vein, right upper pulmonary vein and right lower pulmonary vein are normal. IAS/Shunts: The atrial septum is grossly normal. Additional Comments: Spectral Doppler performed.  AORTA Ao Root diam: 4.20 cm Ao Asc diam:  4.27 cm Lennie Odor MD Electronically signed by Lennie Odor MD Signature Date/Time: 09/04/2022/1:05:41 PM    Final    EP STUDY  Result Date: 09/04/2022 See surgical note for result.  ECHOCARDIOGRAM COMPLETE  Result Date: 09/03/2022    ECHOCARDIOGRAM REPORT   Patient Name:   LAVORIS RICKETSON Date of Exam: 09/03/2022 Medical Rec #:  [id number]        Height:       76.0 in Accession #:  [id number]       Weight:        220.0 lb Date of Birth:  1945-05-13         BSA:          2.307 m Patient Age:    76 years         BP:           131/69 mmHg Patient Gender: M                HR:           97 bpm. Exam Location:  Inpatient Procedure: 2D Echo, Cardiac Doppler and Color Doppler Indications:    I48.91* Unspeicified atrial fibrillation  History:        Patient has prior history of Echocardiogram examinations.                 Arrythmias:LBBB; Risk Factors:Hypertension and Dyslipidemia.  Sonographer:    Mike Gip Referring Phys: Therisa Doyne IMPRESSIONS  1. Left ventricular ejection fraction, by estimation, is 55 to 60%. The left ventricle has normal function. The left ventricle has no regional wall motion abnormalities. There is severe asymmetric left ventricular hypertrophy of the basal-septal segment. Left ventricular diastolic parameters are indeterminate.  2. Right ventricular systolic function is hyperdynamic. The right ventricular size is normal. Tricuspid regurgitation signal is inadequate for assessing PA pressure.  3. The mitral valve was not well visualized. No evidence of mitral valve regurgitation. No evidence of mitral stenosis.  4. Abnormal calcification of the non coronary cusp leaflet. The aortic valve is abnormal. Aortic valve regurgitation is not visualized. No aortic stenosis is present.  5. Aortic dilatation noted. There is mild dilatation of the aortic root, measuring 43 mm. There is mild dilatation of the ascending aorta, measuring 44 mm.  6. The inferior vena cava is dilated in size with >50% respiratory variability, suggesting right atrial pressure of 8 mmHg. Comparison(s): No significant change from prior study. FINDINGS  Left Ventricle: Left ventricular ejection fraction, by estimation, is 55 to 60%. The left ventricle has normal function. The left ventricle has no regional wall motion abnormalities. The left ventricular internal cavity size was normal in size. There is  severe asymmetric left  ventricular hypertrophy of the basal-septal segment. Abnormal (paradoxical) septal motion, consistent with left bundle branch block. Left ventricular diastolic parameters are indeterminate. Right Ventricle: The right ventricular size is normal. No increase in right ventricular wall thickness. Right ventricular systolic function is hyperdynamic. Tricuspid regurgitation signal is inadequate for assessing PA pressure. Left Atrium: Left atrial size was normal in size. Right Atrium: Right atrial size was normal in size. Pericardium: There is no evidence of pericardial effusion. Mitral Valve: The mitral valve was not well visualized. No evidence of mitral valve regurgitation. No evidence of mitral valve stenosis. Tricuspid Valve: The tricuspid valve is grossly normal. Tricuspid valve regurgitation is not demonstrated. No evidence of tricuspid stenosis. Aortic Valve: Abnormal calcification of the non coronary cusp leaflet. The aortic valve is abnormal. Aortic valve regurgitation is not visualized. No aortic stenosis is present. Pulmonic Valve: The pulmonic valve was not well visualized. Pulmonic valve regurgitation is not visualized. No evidence of pulmonic stenosis. Aorta: Aortic dilatation noted. There is mild dilatation of the aortic root, measuring 43 mm. There is mild dilatation of the ascending aorta, measuring 44 mm. Venous: The inferior vena cava is dilated in size with greater than 50% respiratory variability, suggesting right atrial pressure of 8 mmHg. IAS/Shunts: No atrial level  shunt detected by color flow Doppler.  LEFT VENTRICLE PLAX 2D LVIDd:         4.70 cm      Diastology LVIDs:         3.30 cm      LV e' medial:    12.20 cm/s LV PW:         1.60 cm      LV E/e' medial:  8.1 LV IVS:        2.00 cm      LV e' lateral:   13.90 cm/s LVOT diam:     2.40 cm      LV E/e' lateral: 7.1 LV SV:         102 LV SV Index:   44 LVOT Area:     4.52 cm  LV Volumes (MOD) LV vol d, MOD A2C: 162.0 ml LV vol d, MOD A4C:  120.0 ml LV vol s, MOD A2C: 64.8 ml LV vol s, MOD A4C: 51.9 ml LV SV MOD A2C:     97.2 ml LV SV MOD A4C:     120.0 ml LV SV MOD BP:      93.5 ml RIGHT VENTRICLE             IVC RV Basal diam:  4.00 cm     IVC diam: 2.30 cm RV S prime:     13.50 cm/s TAPSE (M-mode): 2.1 cm LEFT ATRIUM             Index        RIGHT ATRIUM           Index LA diam:        4.10 cm 1.78 cm/m   RA Area:     16.80 cm LA Vol (A2C):   84.3 ml 36.54 ml/m  RA Volume:   39.20 ml  16.99 ml/m LA Vol (A4C):   61.9 ml 26.83 ml/m LA Biplane Vol: 72.3 ml 31.34 ml/m  AORTIC VALVE LVOT Vmax:   122.00 cm/s LVOT Vmean:  84.600 cm/s LVOT VTI:    0.225 m  AORTA Ao Root diam: 4.30 cm Ao Asc diam:  4.40 cm MITRAL VALVE MV Area (PHT): 4.46 cm    SHUNTS MV Decel Time: 170 msec    Systemic VTI:  0.22 m MV E velocity: 99.20 cm/s  Systemic Diam: 2.40 cm MV A velocity: 40.40 cm/s MV E/A ratio:  2.46 Riley Lam MD Electronically signed by Riley Lam MD Signature Date/Time: 09/03/2022/4:21:49 PM    Final    NM Pulmonary Perfusion  Result Date: 09/03/2022 CLINICAL DATA:  Stage IV colon cancer, tachycardia, shortness of breath, question pulmonary embolism EXAM: NUCLEAR MEDICINE PERFUSION LUNG SCAN TECHNIQUE: Perfusion images were obtained in multiple projections after intravenous injection of radiopharmaceutical. Ventilation scans intentionally deferred if perfusion scan and chest x-ray adequate for interpretation during COVID 19 epidemic. RADIOPHARMACEUTICALS:  4.1 mCi Tc-9m MAA IV COMPARISON:  Chest radiograph 09/02/2022 FINDINGS: Normal perfusion lung scan. No perfusion defects. Residual tracer within RIGHT Port-A-Cath/tubing. IMPRESSION: Normal perfusion lung scan. Electronically Signed   By: Ulyses Southward M.D.   On: 09/03/2022 11:30   DG Chest Portable 1 View  Result Date: 09/02/2022 CLINICAL DATA:  Shortness of breath EXAM: PORTABLE CHEST 1 VIEW COMPARISON:  None Available. FINDINGS: No pleural effusion. No pneumothorax. No focal  airspace opacity. Cardiomegaly. No radiographically apparent displaced rib fractures. Visualized upper abdomen is unremarkable. IMPRESSION: Cardiomegaly.  No pleural effusion or significant pulmonary edema. Electronically Signed  By: Lorenza Cambridge M.D.   On: 09/02/2022 12:16      Consults: Case discussed with cardiology on-call.  They agreed with need for cardioversion performed above.  They agreed with outpatient care if patient return to normal sinus rhythm.   Final Assessment and Plan:   Observe patient for 2 hours after cardioversion.  He remained in normal sinus rhythm hemodynamically stable and feels grossly symptomatically improved.  Given restoration normal sinus rhythm patient feels comfortable outpatient care management he will continue with his medications follow-up with cardiology in the acute outpatient setting.  They stated they will work him in next week.  Patient feels comfortable discharge at this time discharged in no acute distress. Disposition:  I have considered need for hospitalization, however, considering all of the above, I believe this patient is stable for discharge at this time.  Patient/family educated about specific return precautions for given chief complaint and symptoms.  Patient/family educated about follow-up with PCP and cardiology.     Patient/family expressed understanding of return precautions and need for follow-up. Patient spoken to regarding all imaging and laboratory results and appropriate follow up for these results. All education provided in verbal form with additional information in written form. Time was allowed for answering of patient questions. Patient discharged.    Emergency Department Medication Summary:   Medications  lactated ringers bolus 2,000 mL (2,000 mLs Intravenous New Bag/Given 09/12/22 1617)  etomidate (AMIDATE) injection 20 mg (20 mg Intravenous Given 09/12/22 1800)          Clinical Impression:  1. Typical atrial flutter [facility])       Discharge    Clinical Impression:  1. Typical atrial flutter [facility])      Discharge   Final Clinical Impression(s) / ED Diagnoses Final diagnoses:  Typical atrial flutter [facility])    Rx / DC Orders ED Discharge Orders     None         Glyn Ade, MD 09/12/22 [phone]

## 2022-09-12 NOTE — ED Triage Notes (Addendum)
Pt was upstairs picking up an antibiotic due to UTI when he began feeling generally weak and unwell.  No focal weakness, no chest pain.  Pt was brought down for evaluation. Pt was recently in aflutter and had to be converted.  No CP

## 2022-09-12 NOTE — Sedation Documentation (Signed)
Pt shocked @ 120J 

## 2022-09-12 NOTE — Telephone Encounter (Signed)
[name] called to report he is having urinary frequency almost every hour and urine has strong odor. Thinks he has UTI and asking for script to [facility] by 2 pm when he leaves for the beach. Per Louis [name]: will start Septra bid x 7 days. [name] made aware and asking for script to go to [facility] at [facility] so he can pick up on the way to beach.

## 2022-09-12 NOTE — ED Notes (Signed)
Pt given urinal.

## 2022-09-14 ENCOUNTER — Other Ambulatory Visit: Payer: Self-pay

## 2022-09-14 ENCOUNTER — Emergency Department (HOSPITAL_COMMUNITY)
Admission: EM | Admit: 2022-09-14 | Discharge: 2022-09-15 | Disposition: A | Discharge status: Home / Self Care | Payer: Medicare Other | Attending: Emergency Medicine | Admitting: Emergency Medicine

## 2022-09-14 ENCOUNTER — Emergency Department (HOSPITAL_COMMUNITY): Payer: Medicare Other

## 2022-09-14 DIAGNOSIS — Z7901 Long term (current) use of anticoagulants: Secondary | ICD-10-CM | POA: Insufficient documentation

## 2022-09-14 DIAGNOSIS — Z85038 Personal history of other malignant neoplasm of large intestine: Secondary | ICD-10-CM | POA: Diagnosis not present

## 2022-09-14 DIAGNOSIS — I483 Typical atrial flutter: Secondary | ICD-10-CM | POA: Diagnosis not present

## 2022-09-14 DIAGNOSIS — I129 Hypertensive chronic kidney disease with stage 1 through stage 4 chronic kidney disease, or unspecified chronic kidney disease: Secondary | ICD-10-CM | POA: Diagnosis not present

## 2022-09-14 DIAGNOSIS — N179 Acute kidney failure, unspecified: Secondary | ICD-10-CM | POA: Diagnosis not present

## 2022-09-14 DIAGNOSIS — Z79899 Other long term (current) drug therapy: Secondary | ICD-10-CM | POA: Diagnosis not present

## 2022-09-14 DIAGNOSIS — N182 Chronic kidney disease, stage 2 (mild): Secondary | ICD-10-CM | POA: Insufficient documentation

## 2022-09-14 DIAGNOSIS — R000 Tachycardia, unspecified: Secondary | ICD-10-CM | POA: Diagnosis not present

## 2022-09-14 DIAGNOSIS — R339 Retention of urine, unspecified: Secondary | ICD-10-CM | POA: Diagnosis not present

## 2022-09-14 DIAGNOSIS — R0602 Shortness of breath: Secondary | ICD-10-CM | POA: Diagnosis present

## 2022-09-14 DIAGNOSIS — N3289 Other specified disorders of bladder: Secondary | ICD-10-CM | POA: Diagnosis not present

## 2022-09-14 DIAGNOSIS — I4892 Unspecified atrial flutter: Secondary | ICD-10-CM | POA: Diagnosis not present

## 2022-09-14 DIAGNOSIS — K8020 Calculus of gallbladder without cholecystitis without obstruction: Secondary | ICD-10-CM | POA: Diagnosis not present

## 2022-09-14 LAB — BASIC METABOLIC PANEL
Anion gap: 9 (ref 5–15)
BUN: 45 mg/dL — ABNORMAL HIGH (ref 8–23)
CO2: 16 mmol/L — ABNORMAL LOW (ref 22–32)
Calcium: 8.7 mg/dL — ABNORMAL LOW (ref 8.9–10.3)
Chloride: 111 mmol/L (ref 98–111)
Creatinine, Ser: 3.04 mg/dL — ABNORMAL HIGH (ref 0.61–1.24)
GFR, Estimated: 21 mL/min — ABNORMAL LOW (ref >60)
Glucose, Bld: 117 mg/dL — ABNORMAL HIGH (ref 70–99)
Potassium: 4.9 mmol/L (ref 3.5–5.1)
Sodium: 136 mmol/L (ref 135–145)

## 2022-09-14 LAB — CBC
HCT: 32 % — ABNORMAL LOW (ref 39.0–52.0)
Hemoglobin: 9.6 g/dL — ABNORMAL LOW (ref 13.0–17.0)
MCH: 26.4 pg (ref 26.0–34.0)
MCHC: 30 g/dL (ref 30.0–36.0)
MCV: 88.2 fL (ref 80.0–100.0)
Platelets: 263 10*3/uL (ref 150–400)
RBC: 3.63 MIL/uL — ABNORMAL LOW (ref 4.22–5.81)
RDW: 17.2 % — ABNORMAL HIGH (ref 11.5–15.5)
WBC: 8.2 10*3/uL (ref 4.0–10.5)
nRBC: 0 % (ref 0.0–0.2)

## 2022-09-14 LAB — PROTIME-INR
INR: 1.3 — ABNORMAL HIGH (ref 0.8–1.2)
Prothrombin Time: 16.8 seconds — ABNORMAL HIGH (ref 11.4–15.2)

## 2022-09-14 LAB — TROPONIN I (HIGH SENSITIVITY): Troponin I (High Sensitivity): 11 ng/L (ref <18)

## 2022-09-14 NOTE — ED Triage Notes (Addendum)
Patient reports rapid heart rate at home this evening HR= 130+ with mild SOB , denies chest pain or palpitations , no dizziness or lightheadedness.

## 2022-09-15 ENCOUNTER — Emergency Department (HOSPITAL_COMMUNITY): Payer: Medicare Other

## 2022-09-15 DIAGNOSIS — K8020 Calculus of gallbladder without cholecystitis without obstruction: Secondary | ICD-10-CM | POA: Diagnosis not present

## 2022-09-15 DIAGNOSIS — I483 Typical atrial flutter: Secondary | ICD-10-CM | POA: Diagnosis not present

## 2022-09-15 DIAGNOSIS — N3289 Other specified disorders of bladder: Secondary | ICD-10-CM | POA: Diagnosis not present

## 2022-09-15 LAB — MAGNESIUM: Magnesium: 1.8 mg/dL (ref 1.7–2.4)

## 2022-09-15 LAB — TROPONIN I (HIGH SENSITIVITY): Troponin I (High Sensitivity): 13 ng/L

## 2022-09-15 MED ORDER — SODIUM CHLORIDE 0.9 % IV BOLUS
500.0000 mL | Freq: Once | INTRAVENOUS | Status: AC
  Administered 2022-09-15: 500 mL via INTRAVENOUS

## 2022-09-15 MED ORDER — METOPROLOL TARTRATE 25 MG PO TABS: 50.0000 mg | ORAL_TABLET | Freq: Two times a day (BID) | ORAL | 3 refills | Status: DC

## 2022-09-15 NOTE — ED Notes (Signed)
 urine via bladder scan

## 2022-09-15 NOTE — ED Provider Notes (Signed)
[place] EMERGENCY DEPARTMENT AT [facility] Provider Note   CSN: [id number] Arrival date & time: 09/14/22  2156     History  Chief Complaint  Patient presents with   Atrial Fibrillation     Louis [name] is a 77 y.o. male.  HPI     This is a 77 year old male who presents with concern for recurrent atrial flutter.  He has had 1 admission and 1 ER visit in the last 10 days with the same.  This is a new diagnosis for him.  He is currently managed with Eliquis and metoprolol.  He was supposed to go to atrial fibrillation clinic on Thursday but missed that appointment.  Otherwise he has not had outpatient cardiology follow-up.  Today he was carrying in some luggage and felt short of breath.  Heart rate was 130s.  Denies chest pain, dizziness, lightheadedness.  Currently he is asymptomatic and appears to be in sinus rhythm.  Home Medications Prior to Admission medications   Medication Sig Start Date End Date Taking? Authorizing Provider  acetaminophen (TYLENOL) 325 MG tablet Tylenol    [provider]  apixaban (ELIQUIS) 5 MG TABS tablet Take 1 tablet (5 mg total) by mouth 2 (two) times daily. 09/04/22   Danford, Earl Lites, MD  encorafenib (BRAFTOVI) 75 MG capsule Take 4 capsules (300 mg total) by mouth daily. 08/18/22   Ladene Artist, MD  ferrous sulfate 325 (65 FE) MG EC tablet Take 1 tablet (325 mg total) by mouth 2 (two) times daily with a meal. 07/23/22   Rana Snare, NP  lidocaine-prilocaine (EMLA) cream Apply 1 Application topically as needed. Apply 1/2 tablespoon to port site 2 hours prior to stick and cover with plastic wrap to numb site. DO NOT USE UNTIL 14 DAYS AFTER PORT IS PLACED. 03/19/22   Ladene Artist, MD  loratadine (CLARITIN) 10 MG tablet Take 10 mg by mouth daily as needed for allergies.     [provider]  Magnesium Oxide 400 MG CAPS Take 1 capsule (400 mg total) by mouth 2 (two) times daily. 07/23/22   Rana Snare, NP   metoprolol tartrate (LOPRESSOR) 25 MG tablet Take 2 tablets (50 mg total) by mouth 2 (two) times daily. 09/15/22   Laci Frenkel, Mayer Masker, MD  ondansetron (ZOFRAN) 8 MG tablet Take 1 tablet (8 mg total) by mouth every 8 (eight) hours as needed for nausea or vomiting. 12/07/20   Ladene Artist, MD  prochlorperazine (COMPAZINE) 10 MG tablet Take 10 mg by mouth every 6 (six) hours as needed for nausea or vomiting.    [provider]  sulfamethoxazole-trimethoprim (BACTRIM) 400-80 MG tablet Take 1 tablet by mouth 2 (two) times daily for 7 days. 09/12/22 09/19/22  Rana Snare, NP      Allergies    Patient has no known allergies.    Review of Systems   Review of Systems  Constitutional:  Negative for fever.  Respiratory:  Positive for shortness of breath.   Cardiovascular:  Negative for chest pain.  Gastrointestinal:  Negative for abdominal pain, nausea and vomiting.  All other systems reviewed and are negative.   Physical Exam Updated Vital Signs BP (!) 178/94 (BP Location: Right Arm)   Pulse 72   Temp 98 F (36.7 C) (Oral)   Resp 18   SpO2 99%  Physical Exam Vitals and nursing note reviewed.  Constitutional:      Appearance: He is well-developed. He is obese. He  is not ill-appearing.  HENT:     Head: Normocephalic and atraumatic.     Mouth/Throat:     Mouth: Mucous membranes are moist.  Cardiovascular:     Rate and Rhythm: Normal rate and regular rhythm.     Heart sounds: Normal heart sounds. No murmur heard. Pulmonary:     Effort: Pulmonary effort is normal. No respiratory distress.     Breath sounds: Normal breath sounds. No wheezing.  Abdominal:     Palpations: Abdomen is soft.     Tenderness: There is no abdominal tenderness.  Musculoskeletal:     Cervical back: Neck supple.     Comments: Trace bilateral lower extremity edema  Lymphadenopathy:     Cervical: No cervical adenopathy.  Skin:    General: Skin is warm and dry.  Neurological:     Mental Status: He  is alert and oriented to person, place, and time.  Psychiatric:        Mood and Affect: Mood normal.     ED Results / Procedures / Treatments   Labs (all labs ordered are listed, but only abnormal results are displayed) Labs Reviewed  BASIC METABOLIC PANEL - Abnormal; Notable for the following components:      Result Value   CO2 16 (*)    Glucose, Bld 117 (*)    BUN 45 (*)    Creatinine, Ser 3.04 (*)    Calcium 8.7 (*)    GFR, Estimated 21 (*)    All other components within normal limits  CBC - Abnormal; Notable for the following components:   RBC 3.63 (*)    Hemoglobin 9.6 (*)    HCT 32.0 (*)    RDW 17.2 (*)    All other components within normal limits  PROTIME-INR - Abnormal; Notable for the following components:   Prothrombin Time 16.8 (*)    INR 1.3 (*)    All other components within normal limits  MAGNESIUM  TROPONIN I (HIGH SENSITIVITY)  TROPONIN I (HIGH SENSITIVITY)    EKG EKG Interpretation  Date/Time:  Sunday Sep 14 2022 22:18:23 EDT Ventricular Rate:  79 PR Interval:  170 QRS Duration: 166 QT Interval:  432 QTC Calculation: 495 R Axis:   0 Text Interpretation: Normal sinus rhythm Left bundle branch block Abnormal ECG When compared with ECG of 12-Sep-2022 18:01, PREVIOUS ECG IS PRESENT Confirmed by Ross Marcus ([id number]) on 09/14/2022 11:50:11 PM  Radiology CT Renal Stone Study  Result Date: 09/15/2022 CLINICAL DATA:  Bladder neck obstruction. EXAM: CT ABDOMEN AND PELVIS WITHOUT CONTRAST TECHNIQUE: Multidetector CT imaging of the abdomen and pelvis was performed following the standard protocol without IV contrast. RADIATION DOSE REDUCTION: This exam was performed according to the departmental dose-optimization program which includes automated exposure control, adjustment of the mA and/or kV according to patient size and/or use of iterative reconstruction technique. COMPARISON:  07/21/2022. FINDINGS: Lower chest: The heart is enlarged and there is a trace  pericardial effusion. Coronary artery calcifications are noted. Hepatobiliary: No focal liver abnormality is seen. No biliary ductal dilatation. A stone is noted in the gallbladder. Pancreas: Unremarkable. No pancreatic ductal dilatation or surrounding inflammatory changes. Spleen: Normal in size without focal abnormality. Adrenals/Urinary Tract: Adrenal glands are within normal limits. No renal calculus or obstructive uropathy bilaterally. Cysts are present in the kidneys bilaterally. A tiny calcification is present in the posterior aspect of the urinary bladder, unchanged from the prior exam. The bladder is distended and irregular in contour and there is a possible  small diverticulum in the posterior bladder on the right. Stomach/Bowel: Stomach is within normal limits. Appendix appears normal. No evidence of bowel wall thickening, distention, or inflammatory changes. No free air or pneumatosis. Few scattered diverticula are present along the colon without evidence of diverticulitis. Vascular/Lymphatic: Aortic atherosclerosis. An enlarged lymph node is present in the inguinal region on the left measuring 2.6 x 3.7 cm, increased in size from the prior exam. Reproductive: The prostate gland is enlarged. Other: No abdominopelvic ascites. Musculoskeletal: Degenerative changes are present in the thoracolumbar spine. No acute osseous abnormality. IMPRESSION: 1. Distended urinary bladder with bladder calculus. The prostate gland is enlarged which may be associated with bladder outlet obstruction. 2. Cholelithiasis. 3. Enlarged left inguinal lymph node, increased in size from the prior exam. 4. Aortic atherosclerosis and coronary artery calcification. Electronically Signed   By: Thornell Sartorius M.D.   On: 09/15/2022 03:30   DG Chest 2 View  Result Date: 09/14/2022 CLINICAL DATA:  Rapid heart rate and shortness of breath. EXAM: CHEST - 2 VIEW COMPARISON:  Sep 12, 2022 FINDINGS: There is stable right-sided venous  Port-A-Cath positioning. The cardiac silhouette is mildly enlarged and unchanged in size. The lungs are hyperinflated. There is no evidence of an acute infiltrate, pleural effusion or pneumothorax. Multilevel degenerative changes are seen throughout the thoracic spine. IMPRESSION: No active cardiopulmonary disease. Electronically Signed   By: Aram Candela M.D.   On: 09/14/2022 22:46    Procedures Procedures    Medications Ordered in ED Medications  sodium chloride 0.9 % bolus 500 mL (0 mLs Intravenous Stopped 09/15/22 0331)    ED Course/ Medical Decision Making/ A&P Clinical Course as of 09/15/22 0430  Mon Sep 15, 2022  0200 Spoke with cardiology fellow.  Plan to increase metoprolol to 50 mg twice daily. [CH]  0215 Family at bedside expressing concerns for urinary frequency.  Suspects that he has likely had this for some time.  Recent negative urinalysis.  I have agreed to bedside bladder scan.  Patient with greater than 800 cc of retention.  This could be contributing to his AKI.  Foley catheter was placed. [CH]    Clinical Course User Index [CH] Emmi Wertheim, Mayer Masker, MD                             Medical Decision Making Amount and/or Complexity of Data Reviewed Labs: ordered. Radiology: ordered.  Risk Prescription drug management.   This patient presents to the ED for concern of tachycardia, shortness of breath, this involves an extensive number of treatment options, and is a complaint that carries with it a high risk of complications and morbidity.  I considered the following differential and admission for this acute, potentially life threatening condition.  The differential diagnosis includes persistent atrial fibrillation/flutter, other arrhythmia, pneumonia, pneumothorax, heart failure  MDM:    This is a 77 year old male who presents with recurrent tachycardia.  Upon arrival to the ED, heart rate is normal sinus rhythm.  He is currently asymptomatic.  I suspect he was  likely in flutter and given his history.  EKG is nonischemic and troponin x 2 is reassuring.  Chest x-ray without pneumothorax or pneumonia.  Electrolytes are reassuring.  Family at bedside did indicate that he is peeing very frequently and recently had negative urinalysis.  Bladder scan at the bedside reveals 800 cc of retention.  Patient consented to Foley catheter placement.  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  Patient was educated regarding Foley catheter care and will be referred to urology.  Regarding his atrial fibrillation, will increase metoprolol to 50 mg twice daily after discussing with cardiology fellow.  I have placed an urgent referral to cardiology to see if he can get an appointment before the 25th June.  I have also encouraged them to reach out to A-fib clinic to see if they can get in the atrial fibrillation clinic this week.  (Labs, imaging, consults)  Labs: I Ordered, and personally interpreted labs.  The pertinent results include: CBC, BMP, magnesium, troponin x 2  Imaging Studies ordered: I ordered imaging studies including chest x-ray I independently visualized and interpreted imaging. I agree with the radiologist interpretation  Additional history obtained from family at bedside.  External records from outside source obtained and reviewed including recent discharge summary Cardiac Monitoring: The patient was maintained on a cardiac monitor.  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     Medicines Meds ordered this encounter  Medications   sodium chloride 0.9 % bolus 500 mL   metoprolol tartrate (LOPRESSOR) 25 MG tablet    Sig: Take 2 tablets (50 mg total) by mouth 2 (two) times daily.    Dispense:  60 tablet    Refill:  3    I have reviewed the patients home medicines and have made adjustments as needed  Problem List / ED Course: Problem List Items Addressed This Visit       Cardiovascular and Mediastinum   Atrial flutter (HCC) - Primary   Relevant Medications   metoprolol tartrate (LOPRESSOR) 25 MG tablet   Other Relevant Orders   Ambulatory referral to Cardiology   Other Visit Diagnoses     Urinary retention       AKI (acute kidney injury) (HCC)                       Final Clinical Impression(s) / ED Diagnoses Final diagnoses:  Typical atrial flutter (HCC)  Urinary retention  AKI (acute kidney injury) (HCC)    Rx / DC Orders ED Discharge Orders          Ordered    Ambulatory referral to Cardiology        09/15/22 0345    metoprolol tartrate (LOPRESSOR) 25 MG tablet  2 times daily        09/15/22 0345  Shon Baton, MD 09/15/22 [phone]

## 2022-09-15 NOTE — Discharge Instructions (Addendum)
You were seen today for concern for ongoing atrial flutter.  Increase your metoprolol to 50 mg twice daily.  I have placed an urgent referral to cardiology.  Call the atrial fibrillation clinic as well.  Continue your Eliquis.  Regarding your urinary retention, follow-up with urology in 5 to 7 days for catheter removal and urodynamic testing.

## 2022-09-22 ENCOUNTER — Ambulatory Visit (HOSPITAL_COMMUNITY)
Admission: RE | Admit: 2022-09-22 | Discharge: 2022-09-22 | Disposition: A | Discharge status: Home / Self Care | Payer: Medicare Other | Source: Ambulatory Visit | Attending: Internal Medicine | Admitting: Internal Medicine

## 2022-09-22 VITALS — BP 172/100 | HR 74 | Ht 76.0 in | Wt 213.6 lb

## 2022-09-22 DIAGNOSIS — I440 Atrioventricular block, first degree: Secondary | ICD-10-CM | POA: Insufficient documentation

## 2022-09-22 DIAGNOSIS — I4892 Unspecified atrial flutter: Secondary | ICD-10-CM

## 2022-09-22 DIAGNOSIS — Z7901 Long term (current) use of anticoagulants: Secondary | ICD-10-CM | POA: Insufficient documentation

## 2022-09-22 DIAGNOSIS — I77819 Aortic ectasia, unspecified site: Secondary | ICD-10-CM | POA: Insufficient documentation

## 2022-09-22 DIAGNOSIS — I10 Essential (primary) hypertension: Secondary | ICD-10-CM | POA: Diagnosis not present

## 2022-09-22 DIAGNOSIS — I4891 Unspecified atrial fibrillation: Secondary | ICD-10-CM | POA: Diagnosis not present

## 2022-09-22 DIAGNOSIS — I480 Paroxysmal atrial fibrillation: Secondary | ICD-10-CM

## 2022-09-22 DIAGNOSIS — Z79899 Other long term (current) drug therapy: Secondary | ICD-10-CM | POA: Insufficient documentation

## 2022-09-22 DIAGNOSIS — I447 Left bundle-branch block, unspecified: Secondary | ICD-10-CM | POA: Diagnosis not present

## 2022-09-22 DIAGNOSIS — C189 Malignant neoplasm of colon, unspecified: Secondary | ICD-10-CM | POA: Diagnosis not present

## 2022-09-22 DIAGNOSIS — D6869 Other thrombophilia: Secondary | ICD-10-CM | POA: Diagnosis not present

## 2022-09-22 DIAGNOSIS — I340 Nonrheumatic mitral (valve) insufficiency: Secondary | ICD-10-CM | POA: Diagnosis not present

## 2022-09-22 MED ORDER — METOPROLOL TARTRATE 25 MG PO TABS: 50.0000 mg | ORAL_TABLET | Freq: Two times a day (BID) | ORAL | 3 refills | Status: DC

## 2022-09-22 MED ORDER — APIXABAN 5 MG PO TABS: 5.0000 mg | ORAL_TABLET | Freq: Two times a day (BID) | ORAL | 3 refills | Status: DC

## 2022-09-22 NOTE — Progress Notes (Signed)
Primary Care Physician: [name], MD Primary Cardiologist: None Primary Electrophysiologist: None Referring Physician: Dr. Jearld Lesch Louis [name] is a 77 y.o. male with a history of HTN, colon cancer, aortic atherosclerosis, CKD stage IV, LBBB, ascending aortic aneurysm, and atrial fibrillation who presents for consultation in the [facility]. The patient was initially diagnosed with atrial fibrillation 4/30 when he presented to the ED with SOB and tachycardia. He underwent TEE/DCCV on 09/04/22 with successful conversion to NSR x 1 shock. He was discharged on 09/04/22 taking Eliquis 5 mg BID and lopressor 25 mg BID. He went to the ED again on 5/10 and underwent successful DCCV from atrial flutter to NSR. He went to the ED again on 5/12 but had spontaneously converted to NSR. Metoprolol increased to 50 mg BID. Patient is on Eliquis 5 mg BID for a CHADS2VASC score of 4.  On evaluation today, he is currently in rate controlled atrial flutter. He feels short of breath and tired when out of rhythm. No chest pain.   He is compliant with anticoagulation and has not missed any doses. He has no bleeding concerns.  Today, he denies symptoms of palpitations, chest pain, orthopnea, PND, lower extremity edema, dizziness, presyncope, syncope, snoring, daytime somnolence, bleeding, or neurologic sequela. The patient is tolerating medications without difficulties and is otherwise without complaint today.    he has a BMI of Body mass index is 26 kg/m.Marland Kitchen Filed Weights   09/22/22 1106  Weight: 96.9 kg    Family History  Problem Relation Age of Onset   Hyperlipidemia Father    Diverticulitis Brother    Colon cancer Neg Hx    Esophageal cancer Neg Hx    Rectal cancer Neg Hx    Stomach cancer Neg Hx    Colon polyps Neg Hx    Crohn's disease Neg Hx      Atrial Fibrillation Management history:  Previous antiarrhythmic drugs: None Previous cardioversions:  09/04/22, 09/12/22 Previous ablations: None Anticoagulation history: Eliquis 5 mg BID   Past Medical History:  Diagnosis Date   Allergy    seasonal allergies   Anemia    Arthritis 2010   Ascending aorta dilatation (HCC)    4.1 cm noted on CT1/15/21   Blood transfusion without reported diagnosis    CKD (chronic kidney disease) stage 2, GFR 60-89 ml/min    see [place], Stage 1   colon ca dx'd 04/2016   colon   Hernia, abdominal    sx fixed   Hyperlipidemia    on meds   Hypertension    on meds   LBBB (left bundle branch block) 08/18/2019   Neuropathy    Pneumonia 05/19/2018   Seasonal allergies    Thrombocytopenia (HCC)    during chemo.   Past Surgical History:  Procedure Laterality Date   BIOPSY  04/10/2016   Procedure: BIOPSY Mesentary;  Surgeon: Jimmye Norman, MD;  Location: [facility] OR;  Service: General;;   CARDIOVERSION N/A 09/04/2022   Procedure: CARDIOVERSION;  Surgeon: Sande Rives, MD;  Location: [facility] INVASIVE CV LAB;  Service: Cardiovascular;  Laterality: N/A;   COLONOSCOPY  2019   TA/tubulovillous adenoma   COLOSTOMY  2018   reversed   COLOSTOMY REVERSAL N/A 01/12/2017   Procedure: COLOSTOMY REVERSAL;  Surgeon: Jimmye Norman, MD;  Location: [facility] OR;  Service: General;  Laterality: N/A;   FLEXIBLE SIGMOIDOSCOPY N/A 04/09/2016   Procedure: FLEXIBLE SIGMOIDOSCOPY;  Surgeon: Meryl Dare, MD;  Location: [facility] ENDOSCOPY;  Service: Endoscopy;  Laterality: N/A;   HERNIA REPAIR     in childhood   INGUINAL HERNIA REPAIR Right 03/29/2018   Procedure: OPEN REPAIR OF RIGHT INGUINAL HERNIA WITH MESH;  Surgeon: Jimmye Norman, MD;  Location: [facility] OR;  Service: General;  Laterality: Right;   INSERTION OF MESH N/A 03/29/2018   Procedure: INSERTION OF MESH;  Surgeon: Jimmye Norman, MD;  Location: [facility] OR;  Service: General;  Laterality: N/A;   IR GENERIC HISTORICAL  05/14/2016   IR US GUIDE VASC ACCESS RIGHT 05/14/2016 Gilmer Mor, DO [facility]-INTER[facility] RAD   IR GENERIC HISTORICAL  05/14/2016   IR  FLUORO GUIDE PORT INSERTION RIGHT 05/14/2016 Gilmer Mor, DO [facility]   IR IMAGING GUIDED PORT INSERTION  12/17/2020   IR REMOVAL TUN ACCESS W/ PORT W/O FL MOD SED  11/11/2016   PARTIAL COLECTOMY N/A 04/10/2016   Procedure: SIGMOID  COLECTOMY WITH COLOSTOMY;  Surgeon: Jimmye Norman, MD;  Location: [facility] OR;  Service: General;  Laterality: N/A;   TEE WITHOUT CARDIOVERSION N/A 09/04/2022   Procedure: TRANSESOPHAGEAL ECHOCARDIOGRAM;  Surgeon: Sande Rives, MD;  Location: [facility] INVASIVE CV LAB;  Service: Cardiovascular;  Laterality: N/A;   TONSILLECTOMY     TOTAL KNEE ARTHROPLASTY Left 09/29/2019   Procedure: TOTAL KNEE ARTHROPLASTY;  Surgeon: Durene Romans, MD;  Location: [facility] ORS;  Service: Orthopedics;  Laterality: Left;  70 mins    Current Outpatient Medications  Medication Sig Dispense Refill   acetaminophen (TYLENOL) 325 MG tablet Tylenol     apixaban (ELIQUIS) 5 MG TABS tablet Take 1 tablet (5 mg total) by mouth 2 (two) times daily. 60 tablet 3   doxycycline (VIBRA-TABS) 100 MG tablet Take 100 mg by mouth 2 (two) times daily.     encorafenib (BRAFTOVI) 75 MG capsule Take 4 capsules (300 mg total) by mouth daily. 120 capsule 1   lidocaine-prilocaine (EMLA) cream Apply 1 Application topically as needed. Apply 1/2 tablespoon to port site 2 hours prior to stick and cover with plastic wrap to numb site. DO NOT USE UNTIL 14 DAYS AFTER PORT IS PLACED. 30 g 2   loratadine (CLARITIN) 10 MG tablet Take 10 mg by mouth daily as needed for allergies.      metoprolol tartrate (LOPRESSOR) 25 MG tablet Take 2 tablets (50 mg total) by mouth 2 (two) times daily. 60 tablet 3   ondansetron (ZOFRAN) 8 MG tablet Take 1 tablet (8 mg total) by mouth every 8 (eight) hours as needed for nausea or vomiting. 30 tablet 0   prochlorperazine (COMPAZINE) 10 MG tablet Take 10 mg by mouth every 6 (six) hours as needed for nausea or vomiting.     ferrous sulfate 325 (65 FE) MG EC tablet Take 1 tablet (325 mg total) by mouth 2  (two) times daily with a meal. (Patient not taking: Reported on 09/22/2022) 60 tablet 3   Magnesium Oxide 400 MG CAPS Take 1 capsule (400 mg total) by mouth 2 (two) times daily. (Patient not taking: Reported on 09/22/2022) 60 capsule 2   No current facility-administered medications for this encounter.   Facility-Administered Medications Ordered in Other Encounters  Medication Dose Route Frequency Provider Last Rate Last Admin   sodium chloride flush (NS) 0.9 % injection 10 mL  10 mL Intravenous PRN Ladene Artist, MD   10 mL at 12/26/21 1010    No Known Allergies  Social History   Socioeconomic History   Marital status: Married    Spouse name: Not on file  Number of children: Not on file   Years of education: Not on file   Highest education level: Not on file  Occupational History   Not on file  Tobacco Use   Smoking status: Never    Passive exposure: Never   Smokeless tobacco: Never  Vaping Use   Vaping Use: Never used  Substance and Sexual Activity   Alcohol use: Yes    Alcohol/week: 3.0 - 4.0 standard drinks of alcohol    Types: 3 - 4 Glasses of wine per week    Comment: 3-4 glasses of wine per week   Drug use: No   Sexual activity: Not on file  Other Topics Concern   Not on file  Social History Narrative   Married, wife Thurston Hole   Has #2 daughters and #2 brothers   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   Social Determinants of Corporate investment banker Strain: Not on file  Food Insecurity: No Food Insecurity (09/02/2022)   Hunger Vital Sign    Worried About Running Out of Food in the Last Year: Never true    Ran Out of Food in the Last Year: Never true  Transportation Needs: No Transportation Needs (09/02/2022)   PRAPARE - Administrator, Civil Service (Medical): No    Lack of Transportation (Non-Medical): No  Physical Activity: Not on file  Stress: Not on file  Social Connections: Not on file  Intimate Partner Violence: Not At Risk  (09/02/2022)   Humiliation, Afraid, Rape, and Kick questionnaire    Fear of Current or Ex-Partner: No    Emotionally Abused: No    Physically Abused: No    Sexually Abused: No     ROS- All systems are reviewed and negative except as per the HPI above.  Physical Exam: Vitals:   09/22/22 1106  BP: (!) 172/100  Pulse: 74  Weight: 96.9 kg  Height: 6\' 4"  (1.93 m)    GEN- The patient is a well appearing male, alert and oriented x 3 today.   Head- normocephalic, atraumatic Eyes-  Sclera clear, conjunctiva pink Ears- hearing intact Oropharynx- clear Neck- supple  Lungs- Clear to ausculation bilaterally, normal work of breathing Heart- Irregular rate and rhythm, no murmurs, rubs or gallops  GI- soft, NT, ND, + BS Extremities- no clubbing, cyanosis, or edema MS- no significant deformity or atrophy Skin- no rash or lesion Psych- euthymic mood, full affect Neuro- strength and sensation are intact  Wt Readings from Last 3 Encounters:  09/22/22 96.9 kg  09/02/22 99.8 kg  08/12/22 100.1 kg    EKG today demonstrates  Vent. rate 74 BPM PR interval 232 ms QRS duration 160 ms QT/QTcB 410/455 ms P-R-T axes * 28 107 Sinus rhythm with 1st degree A-V block Left bundle branch block Abnormal ECG When compared with ECG of 14-Sep-2022 22:18, PREVIOUS ECG IS PRESENT  Echo TEE 09/04/22 demonstrated:  1. No left atrial/left atrial appendage thrombus was detected. The LAA  emptying velocity was 62 cm/s.   2. Aortic dilatation noted. There is mild dilatation of the aortic root,  measuring 42 mm. There is mild dilatation of the ascending aorta,  measuring 42 mm. There is Severe (Grade IV) protruding plaque involving  the aortic arch.   3. Left ventricular ejection fraction, by estimation, is 55 to 60%. The  left ventricle has normal function.   4. Right ventricular systolic function is normal. The right ventricular  size is normal.   5. The mitral valve  is grossly normal. Mild mitral  valve regurgitation.  No evidence of mitral stenosis.   6. The aortic valve is tricuspid. Aortic valve regurgitation is not  visualized. No aortic stenosis is present.   Epic records are reviewed at length today.  CHA2DS2-VASc Score = 4  The patient's score is based upon: CHF History: 0 HTN History: 1 Diabetes History: 0 Stroke History: 0 Vascular Disease History: 1 Age Score: 2 Gender Score: 0       ASSESSMENT AND PLAN: Paroxysmal Atrial flutter The patient's CHA2DS2-VASc score is 4, indicating a 4.8% annual risk of stroke.    Discussion regarding abnormal heart rhythm and visual diagram used to explain findings.  He has a diagnosis of stage IV colon cancer and currently taking encorafenib and every 3-week panitumumab. These medications are contraindicated with usage of AAD treatments due to risk of QT prolongation. His only option for treatment may be ablation pending EP review. I will place a cardiac monitor to confirm whether or not he has atrial fibrillation in addition to atrial flutter.  Daughter would like to schedule appt with Dr. Elberta Fortis to discuss. This is reasonable and by time of appointment hopefully monitor results should be back. If he has both arrhythmias and/or atypical flutter this may change the possibility of a procedure.   2. Secondary Hypercoagulable State (ICD10:  D68.69) The patient is at significant risk for stroke/thromboembolism based upon his CHA2DS2-VASc Score of 4.  Continue Apixaban (Eliquis).  No missed doses  Review of medications demonstrates that encorafenib may decrease the efficacy of Eliquis. Unsure if prior to potential ablation he would have to be switched to coumadin?  3. HTN Recheck today 140s/70s.    Follow up Afib clinic prn.    Lake Bells, PA-C [facility] [address] [place], [place] [address] [phone] 09/22/2022 11:52 AM

## 2022-09-24 ENCOUNTER — Inpatient Hospital Stay: Payer: Medicare Other

## 2022-09-24 ENCOUNTER — Inpatient Hospital Stay (HOSPITAL_BASED_OUTPATIENT_CLINIC_OR_DEPARTMENT_OTHER): Payer: Medicare Other | Admitting: Nurse Practitioner

## 2022-09-24 ENCOUNTER — Encounter: Payer: Self-pay | Admitting: Nurse Practitioner

## 2022-09-24 ENCOUNTER — Inpatient Hospital Stay: Payer: Medicare Other | Attending: Oncology

## 2022-09-24 VITALS — BP 130/86 | HR 79 | Resp 18

## 2022-09-24 VITALS — BP 140/72 | HR 98 | Temp 98.2°F | Resp 18 | Ht 76.0 in | Wt 215.7 lb

## 2022-09-24 DIAGNOSIS — D6959 Other secondary thrombocytopenia: Secondary | ICD-10-CM | POA: Insufficient documentation

## 2022-09-24 DIAGNOSIS — Z5112 Encounter for antineoplastic immunotherapy: Secondary | ICD-10-CM | POA: Insufficient documentation

## 2022-09-24 DIAGNOSIS — I129 Hypertensive chronic kidney disease with stage 1 through stage 4 chronic kidney disease, or unspecified chronic kidney disease: Secondary | ICD-10-CM | POA: Diagnosis not present

## 2022-09-24 DIAGNOSIS — N189 Chronic kidney disease, unspecified: Secondary | ICD-10-CM | POA: Insufficient documentation

## 2022-09-24 DIAGNOSIS — C774 Secondary and unspecified malignant neoplasm of inguinal and lower limb lymph nodes: Secondary | ICD-10-CM | POA: Insufficient documentation

## 2022-09-24 DIAGNOSIS — C189 Malignant neoplasm of colon, unspecified: Secondary | ICD-10-CM

## 2022-09-24 DIAGNOSIS — D649 Anemia, unspecified: Secondary | ICD-10-CM | POA: Insufficient documentation

## 2022-09-24 DIAGNOSIS — C187 Malignant neoplasm of sigmoid colon: Secondary | ICD-10-CM | POA: Diagnosis not present

## 2022-09-24 DIAGNOSIS — G620 Drug-induced polyneuropathy: Secondary | ICD-10-CM | POA: Diagnosis not present

## 2022-09-24 LAB — CBC WITH DIFFERENTIAL (CANCER CENTER ONLY)
Abs Immature Granulocytes: 0.02 10*3/uL (ref 0.00–0.07)
Basophils Absolute: 0 10*3/uL (ref 0.0–0.1)
Basophils Relative: 1 %
Eosinophils Absolute: 0.1 10*3/uL (ref 0.0–0.5)
Eosinophils Relative: 2 %
HCT: 31.2 % — ABNORMAL LOW (ref 39.0–52.0)
Hemoglobin: 9.7 g/dL — ABNORMAL LOW (ref 13.0–17.0)
Immature Granulocytes: 0 %
Lymphocytes Relative: 10 %
Lymphs Abs: 0.7 10*3/uL (ref 0.7–4.0)
MCH: 26.7 pg (ref 26.0–34.0)
MCHC: 31.1 g/dL (ref 30.0–36.0)
MCV: 86 fL (ref 80.0–100.0)
Monocytes Absolute: 0.7 10*3/uL (ref 0.1–1.0)
Monocytes Relative: 11 %
Neutro Abs: 5.4 10*3/uL (ref 1.7–7.7)
Neutrophils Relative %: 76 %
Platelet Count: 211 10*3/uL (ref 150–400)
RBC: 3.63 MIL/uL — ABNORMAL LOW (ref 4.22–5.81)
RDW: 17.4 % — ABNORMAL HIGH (ref 11.5–15.5)
WBC Count: 7 10*3/uL (ref 4.0–10.5)
nRBC: 0 % (ref 0.0–0.2)

## 2022-09-24 LAB — CMP (CANCER CENTER ONLY)
ALT: 9 U/L (ref 0–44)
AST: 9 U/L — ABNORMAL LOW (ref 15–41)
Albumin: 3.6 g/dL (ref 3.5–5.0)
Alkaline Phosphatase: 76 U/L (ref 38–126)
Anion gap: 7 (ref 5–15)
BUN: 33 mg/dL — ABNORMAL HIGH (ref 8–23)
CO2: 20 mmol/L — ABNORMAL LOW (ref 22–32)
Calcium: 8.3 mg/dL — ABNORMAL LOW (ref 8.9–10.3)
Chloride: 109 mmol/L (ref 98–111)
Creatinine: 2.43 mg/dL — ABNORMAL HIGH (ref 0.61–1.24)
GFR, Estimated: 27 mL/min — ABNORMAL LOW (ref >60)
Glucose, Bld: 145 mg/dL — ABNORMAL HIGH (ref 70–99)
Potassium: 4.3 mmol/L (ref 3.5–5.1)
Sodium: 136 mmol/L (ref 135–145)
Total Bilirubin: 0.4 mg/dL (ref 0.3–1.2)
Total Protein: 6.5 g/dL (ref 6.5–8.1)

## 2022-09-24 LAB — MAGNESIUM: Magnesium: 1.7 mg/dL (ref 1.7–2.4)

## 2022-09-24 LAB — CEA (ACCESS): CEA (CHCC): 2.03 ng/mL (ref 0.00–5.00)

## 2022-09-24 MED ORDER — PROCHLORPERAZINE MALEATE 10 MG PO TABS
10.0000 mg | ORAL_TABLET | Freq: Four times a day (QID) | ORAL | 3 refills | Status: DC | PRN
Start: 2022-09-24 — End: 2023-04-15

## 2022-09-24 MED ORDER — SODIUM CHLORIDE 0.9 % IV SOLN: Freq: Once | INTRAVENOUS | Status: AC

## 2022-09-24 MED ORDER — SODIUM CHLORIDE 0.9% FLUSH
10.0000 mL | INTRAVENOUS | Status: DC | PRN
  Administered 2022-09-24: 10 mL

## 2022-09-24 MED ORDER — HEPARIN SOD (PORK) LOCK FLUSH 100 UNIT/ML IV SOLN
500.0000 [IU] | Freq: Once | INTRAVENOUS | Status: AC | PRN
  Administered 2022-09-24: 500 [IU]

## 2022-09-24 MED ORDER — SODIUM CHLORIDE 0.9 % IV SOLN
6.0000 mg/kg | Freq: Once | INTRAVENOUS | Status: AC
  Administered 2022-09-24: 600 mg via INTRAVENOUS
  Filled 2022-09-24: qty 20

## 2022-09-24 NOTE — Progress Notes (Signed)
Patient seen by Lisa Thomas NP today  Vitals are within treatment parameters.  Labs reviewed by Lisa Thomas NP and are within treatment parameters.  Per physician team, patient is ready for treatment and there are NO modifications to the treatment plan.     

## 2022-09-24 NOTE — Patient Instructions (Signed)
[facility]   Discharge Instructions: Thank you for choosing [facility] to provide your oncology and hematology care.   If you have a lab appointment with the Cancer Center, please go directly to the Cancer Center and check in at the registration area.   Wear comfortable clothing and clothing appropriate for easy access to any Portacath or PICC line.   We strive to give you quality time with your provider. You may need to reschedule your appointment if you arrive late (15 or more minutes).  Arriving late affects you and other patients whose appointments are after yours.  Also, if you miss three or more appointments without notifying the office, you may be dismissed from the clinic at the provider's discretion.      For prescription refill requests, have your pharmacy contact our office and allow 72 hours for refills to be completed.    Today you received the following chemotherapy and/or immunotherapy agents Panitumumab (VECTIBIX).      To help prevent nausea and vomiting after your treatment, we encourage you to take your nausea medication as directed.  BELOW ARE SYMPTOMS THAT SHOULD BE REPORTED IMMEDIATELY: *FEVER GREATER THAN 100.4 F (38 C) OR HIGHER *CHILLS OR SWEATING *NAUSEA AND VOMITING THAT IS NOT CONTROLLED WITH YOUR NAUSEA MEDICATION *UNUSUAL SHORTNESS OF BREATH *UNUSUAL BRUISING OR BLEEDING *URINARY PROBLEMS (pain or burning when urinating, or frequent urination) *BOWEL PROBLEMS (unusual diarrhea, constipation, pain near the anus) TENDERNESS IN MOUTH AND THROAT WITH OR WITHOUT PRESENCE OF ULCERS (sore throat, sores in mouth, or a toothache) UNUSUAL RASH, SWELLING OR PAIN  UNUSUAL VAGINAL DISCHARGE OR ITCHING   Items with * indicate a potential emergency and should be followed up as soon as possible or go to the Emergency Department if any problems should occur.  Please show the CHEMOTHERAPY ALERT CARD or IMMUNOTHERAPY ALERT  CARD at check-in to the Emergency Department and triage nurse.  Should you have questions after your visit or need to cancel or reschedule your appointment, please contact [facility]  Dept: [phone]  and follow the prompts.  Office hours are 8:00 a.m. to 4:30 p.m. Monday - Friday. Please note that voicemails left after 4:00 p.m. may not be returned until the following business day.  We are closed weekends and major holidays. You have access to a nurse at all times for urgent questions. Please call the main number to the clinic Dept: [phone] and follow the prompts.   For any non-urgent questions, you may also contact your provider using MyChart. We now offer e-Visits for anyone 51 and older to request care online for non-urgent symptoms. For details visit [email or web address].   Also download the MyChart app! Go to the app store, search "MyChart", open the app, select [facility], and log in with your MyChart username and password.  Panitumumab Injection What is this medication? PANITUMUMAB (pan i TOOM ue mab) treats colorectal cancer. It works by blocking a protein that causes cancer cells to grow and multiply. This helps to slow or stop the spread of cancer cells. It is a monoclonal antibody. This medicine may be used for other purposes; ask your health care provider or pharmacist if you have questions. COMMON BRAND NAME(S): Vectibix What should I tell my care team before I take this medication? They need to know if you have any of these conditions: Eye disease Low levels of magnesium in the blood Lung disease An unusual or allergic  reaction to panitumumab, other medications, foods, dyes, or preservatives Pregnant or trying to get pregnant Breast-feeding How should I use this medication? This medication is injected into a vein. It is given by your care team in a hospital or clinic setting. Talk to your care team about the use of this medication  in children. Special care may be needed. Overdosage: If you think you have taken too much of this medicine contact a poison control center or emergency room at once. NOTE: This medicine is only for you. Do not share this medicine with others. What if I miss a dose? Keep appointments for follow-up doses. It is important not to miss your dose. Call your care team if you are unable to keep an appointment. What may interact with this medication? Bevacizumab This list may not describe all possible interactions. Give your health care provider a list of all the medicines, herbs, non-prescription drugs, or dietary supplements you use. Also tell them if you smoke, drink alcohol, or use illegal drugs. Some items may interact with your medicine. What should I watch for while using this medication? Your condition will be monitored carefully while you are receiving this medication. This medication may make you feel generally unwell. This is not uncommon as chemotherapy can affect healthy cells as well as cancer cells. Report any side effects. Continue your course of treatment even though you feel ill unless your care team tells you to stop. This medication can make you more sensitive to the sun. Keep out of the sun while receiving this medication and for 2 months after stopping therapy. If you cannot avoid being in the sun, wear protective clothing and sunscreen. Do not use sun lamps, tanning beds, or tanning booths. Check with your care team if you have severe diarrhea, nausea, and vomiting or if you sweat a lot. The loss of too much body fluid may make it dangerous for you to take this medication. This medication may cause serious skin reactions. They can happen weeks to months after starting the medication. Contact your care team right away if you notice fevers or flu-like symptoms with a rash. The rash may be red or purple and then turn into blisters or peeling of the skin. You may also notice a red rash with  swelling of the face, lips, or lymph nodes in your neck or under your arms. Talk to your care team if you may be pregnant. Serious birth defects can occur if you take this medication during pregnancy and for 2 months after the last dose. Contraception is recommended while taking this medication and for 2 months after the last dose. Your care team can help you find the option that works for you. Do not breastfeed while taking this medication and for 2 months after the last dose. This medication may cause infertility. Talk to your care team if you are concerned about your fertility. What side effects may I notice from receiving this medication? Side effects that you should report to your care team as soon as possible: Allergic reactions--skin rash, itching, hives, swelling of the face, lips, tongue, or throat Dry cough, shortness of breath or trouble breathing Eye pain, redness, irritation, or discharge with blurry or decreased vision Infusion reactions--chest pain, shortness of breath or trouble breathing, feeling faint or lightheaded Low magnesium level--muscle pain or cramps, unusual weakness or fatigue, fast or irregular heartbeat, tremors Low potassium level--muscle pain or cramps, unusual weakness or fatigue, fast or irregular heartbeat, constipation Redness, blistering, peeling, or loosening  of the skin, including inside the mouth Skin reactions on sun-exposed areas Side effects that usually do not require medical attention (report to your care team if they continue or are bothersome): Change in nail shape, thickness, or color Diarrhea Dry skin Fatigue Nausea Vomiting This list may not describe all possible side effects. Call your doctor for medical advice about side effects. You may report side effects to FDA at 1-800-FDA-1088. Where should I keep my medication? This medication is given in a hospital or clinic. It will not be stored at home. NOTE: This sheet is a summary. It may not  cover all possible information. If you have questions about this medicine, talk to your doctor, pharmacist, or health care provider.  2023 Elsevier/Gold Standard (2021-09-02 00:00:00)

## 2022-09-24 NOTE — Progress Notes (Signed)
[facility] OFFICE PROGRESS NOTE   Diagnosis: Colon cancer  INTERVAL HISTORY:   Louis [name] returns for follow-up.  He continues encorafenib.  He was last treated with Panitumumab 08/12/2022.  He was hospitalized with atrial fibrillation 09/02/2022 through 09/04/2022.  He reports an ablation procedure is planned.  He feels tired and weak.  No bleeding.  Skin rash is better.  No nausea, vomiting, diarrhea.  Thinks a "cyst" at the left groin is larger.  No bleeding.  Objective:  Vital signs in last 24 hours:  Blood pressure (!) 140/72, pulse 98, temperature 98.2 F (36.8 C), temperature source Oral, resp. rate 18, height 6\' 4"  (1.93 m), weight 215 lb 11.2 oz (97.8 kg), SpO2 100 %.    HEENT: No thrush or ulcers. Resp: Lungs clear bilaterally. Cardio: Irregular. GI: No hepatomegaly. Vascular: No leg edema. Skin: Acne type rash face and trunk. 40 cath without erythema.  Lab Results:  Lab Results  Component Value Date   WBC 7.0 09/24/2022   HGB 9.7 (L) 09/24/2022   HCT 31.2 (L) 09/24/2022   MCV 86.0 09/24/2022   PLT 211 09/24/2022   NEUTROABS 5.4 09/24/2022    Imaging:  No results found.  Medications: I have reviewed the patient's current medications.  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), isolated mesenteric implant-resected, multiple tumor deposits Sigmoid/descending resection and creation of a descending colostomy 04/10/2016 MSI-stable, no loss of mismatch repair protein expression Foundation 1- BRAF V600E positive, MS-stable, intermediate tumor mutation burden, no RAS mutation CT abdomen/pelvis 04/02/2016-no evidence of distant metastatic disease Cycle 1 FOLFOX 05/21/2016 Cycle 2 FOLFOX 06/04/2016 Cycle 3 FOLFOX 06/18/2016 Cycle 4 FOLFOX 07/02/2016 (oxaliplatin held secondary to thrombocytopenia) Cycle 5 FOLFOX 07/16/2016 Cycle 6 FOLFOX 07/30/2016 Cycle 7 FOLFOX 08/13/2016 (oxaliplatin held and 5-FU dose reduced) Cycle 8 FOLFOX  09/03/2016 (oxaliplatin held secondary to neuropathy) Cycle 9 FOLFOX 09/17/2016 (oxaliplatin held secondary to neuropathy) Cycle 10 FOLFOX 10/02/2016 Cycle 11 FOLFOX 10/15/2016 (oxaliplatin eliminated from the regimen) Cycle 12 FOLFOX 10/30/2016 (oxaliplatin eliminated) CTs 05/12/2017-no evidence of recurrent disease, right inguinal hernia containing bladder Colonoscopy 07/21/2017, 3 polyps were removed from the descending and transverse colon, fragments of tubular and tubulovillous adenoma CTs 05/19/2018-no evidence of recurrent disease, status post hernia repair, right upper lobe pneumonia CTs 05/20/2019-resolution of right upper lobe pneumonia, no evidence of metastatic disease Colonoscopy 01/25/2020-multiple polyps removed-tubular adenomas, poor preparation, repeat colonoscopy recommended CTs neck, chest, and abdomen/pelvis 08/27/2020- new 2 centimeter left axillary node, new 1.9 cm left inguinal node chronic mildly prominent portacaval node Ultrasound biopsy of left inguinal node on 10/29/2020-metastatic adenocarcinoma consistent with colon adenocarcinoma PET 11/12/2020-enlarged hypermetabolic left inguinal and left axillary nodes, solitary focus of hypermetabolic activity in the right lower quadrant small bowel Cycle 1 FOLFOX 12/19/2020 Cycle 2 FOLFOX 01/02/2021, oxaliplatin dose reduced secondary to neutropenia and thrombocytopenia Cycle 3 FOLFOX 01/16/2021 Cycle 4 FOLFOX 01/30/2021 CTs 02/11/2021-no change in left inguinal and left axillary nodes, no evidence of disease progression, stable wall thickening involving a loop of distal ileum Cycle 1 FOLFIRI/Avastin 02/27/2021 Cycle 2 FOLFIRI/Avastin 03/13/2021 Cycle 3 FOLFIRI/Avastin 04/03/2021 Cycle 4 FOLFIRI 04/17/2021, Avastin held due to elevated urine protein 05/07/2021-24-hour urine protein elevated 1562 Cycle 5 FOLFIRI 05/08/2021, Avastin held due to elevated urine protein, irinotecan dose reduced due to in general not feeling well after  treatment CTs 05/17/2021-left axillary and left inguinal lymph nodes are smaller, no evidence of disease progression Cycle 6 FOLFIRI 05/22/2021, Avastin held due to proteinuria 06/03/2021 24-hour urine with 1.9 g of protein  Cycle 7 FOLFIRI 06/12/2021, Avastin held due to proteinuria Cycle 8 FOLFIRI 07/03/2021, Avastin held due to proteinuria Cycle 9 FOLFIRI 07/24/2021, Avastin held due to proteinuria Cycle 10 FOLFIRI 08/19/2021, Avastin held due to proteinuria CTs 09/02/2021-no change in the left axillary, left inguinal, and portacaval lymph nodes.  No mention of the distal small bowel thickening Maintenance 5-fluorouracil 09/18/2021 CT abdomen/pelvis 10/28/2021-enlarged left inguinal lymph node, stable; stomach and small bowel grossly unremarkable. Maintenance 5-fluorouracil continued Colonoscopy 12/10/2021-terminal ileum with an ulcerated partially obstructing medium-sized mass approximately 15 to 20 cm from the IC valve, invasive adenocarcinoma, colonic type, moderately differentiated (grade 2) PET scan 12/18/2021-progression of disease in the peritoneal space.  2 intensely hypermetabolic nodes in the left axilla, one node is new from the prior, the other is reduced in size and metabolic activity; decrease in size and metabolic activity of left inguinal adenopathy; intense metabolic activity associate with a loop of small bowel in the right lower quadrant, no interval change; new small hypermetabolic peritoneal implant along the ventral peritoneal surface just right of midline; larger new peritoneal implant in the deep right pelvis; small new implant in the right mid abdomen Encorafenib/Panitumumab 01/08/2022 CTs 03/17/2022-decrease size of mesenteric implant at the posterior bladder wall, small mesenteric peritoneal lesions identified on PET/CT or not evident.  Decrease size of left inguinal left axillary nodes, hypermetabolic activity noted at the anterior left prostate on comparison August 2023 PET CTs  07/21/2022-decrease size of a left axillary lymph node and right pelvic implant.  Left inguinal lymph node measures smaller.  No new sites of disease identified.  Similar tiny pulmonary nodules. Encorafenib and Panitumumab continued, Panitumumab changed from a 2-week schedule to a 3-week schedule 07/23/2022   2.   Chronic renal insufficiency   3.   Hypertension   4.  Inflamed sebaceous cyst at the upper back-status post incision and drainage   5.  Thrombocytopenia secondary chemotherapy-oxaliplatin dose reduced beginning with cycle 3 FOLFOX, oxaliplatin held with cycle 4 and cycle 7 FOLFOX   6.  Oxaliplatin neuropathy-improved   7.  History of Mucositis secondary chemotherapy   8.  Symptoms of pneumonia January 2020-CT 05/19/2018 consistent with right upper lobe pneumonia, Levaquin prescribed 05/20/2018   9.  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  Anemia, progressive 10/29/2021 2 units packed red blood cells 10/31/2021 Ferritin low 10/29/2021-oral iron Ferritin low 01/22/2022   12.  Colonoscopy 12/10/2021-terminal ileum with an ulcerated partially obstructing medium-sized mass approximately 15 to 20 cm from the IC valve, invasive adenocarcinoma, colonic type, moderately differentiated (grade 2) 13.  Admission 09/02/2022 with atrial flutter and rapid ventricular response  Disposition: [name] appears stable.  He continues encorafenib.  Plan to resume treatment today with Panitumumab (treatment missed 3 weeks ago due to the hospitalization for atrial flutter).  CBC and chemistry panel reviewed.  Labs adequate to proceed as above.  Creatinine with stable elevation.  He will continue follow-up with cardiology for management of atrial flutter.  We will see him in follow-up in 3 weeks.    Lonna Cobb ANP/GNP-BC   09/24/2022  10:54 AM

## 2022-09-25 DIAGNOSIS — R338 Other retention of urine: Secondary | ICD-10-CM | POA: Diagnosis not present

## 2022-09-25 DIAGNOSIS — R972 Elevated prostate specific antigen [PSA]: Secondary | ICD-10-CM | POA: Diagnosis not present

## 2022-09-26 NOTE — Telephone Encounter (Signed)
Patient is following-up on his provider switch to Dr. Duke Salvia.

## 2022-10-01 ENCOUNTER — Encounter: Payer: Self-pay | Admitting: Cardiology

## 2022-10-01 ENCOUNTER — Ambulatory Visit: Payer: Medicare Other | Attending: Cardiology | Admitting: Cardiology

## 2022-10-01 VITALS — BP 116/78 | HR 90 | Ht 76.0 in | Wt 216.0 lb

## 2022-10-01 DIAGNOSIS — D6869 Other thrombophilia: Secondary | ICD-10-CM | POA: Diagnosis not present

## 2022-10-01 DIAGNOSIS — I10 Essential (primary) hypertension: Secondary | ICD-10-CM | POA: Insufficient documentation

## 2022-10-01 DIAGNOSIS — I483 Typical atrial flutter: Secondary | ICD-10-CM

## 2022-10-01 NOTE — Progress Notes (Signed)
Electrophysiology Office Note   Date:  10/01/2022   ID:  Louis [name], DOB [birth year], MRN [id number]  PCP:  [name], MD  Cardiologist:   Primary Electrophysiologist:  Dorothye Berni [name], MD    Chief Complaint: AF   History of Present Illness: Louis [name] is a 77 y.o. male who is being seen today for the evaluation of AF at the request of Louis [name]. Presenting today for electrophysiology evaluation.  He has a history send hypertension, colon cancer, aortic atherosclerosis, stage IV CKD, left bundle branch block, ascending aortic aneurysm, atrial fibrillation.  He was diagnosed with atrial fibrillation 09/02/2022 and he presented to the emergency room with shortness of breath and palpitations.  He underwent cardioversion 09/04/2022.  He again went to the emergency room 09/12/2022 and underwent cardioversion from atrial flutter to sinus rhythm.  Had a third admission to the emergency room 09/14/2022 but converted to sinus rhythm.  He then presented to atrial fibrillation clinic in atrial flutter.  Today, he denies symptoms of palpitations, chest pain, shortness of breath, orthopnea, PND, lower extremity edema, claudication, dizziness, presyncope, syncope, bleeding, or neurologic sequela. The patient is tolerating medications without difficulties.  He does feel quite poorly when he is in atrial flutter with fatigue and shortness of breath.  He finds difficult to do his daily activities.  He prefer a rhythm control strategy.   Past Medical History:  Diagnosis Date   Allergy    seasonal allergies   Anemia    Arthritis 2010   Ascending aorta dilatation (HCC)    4.1 cm noted on CT1/15/21   Blood transfusion without reported diagnosis    CKD (chronic kidney disease) stage 2, GFR 60-89 ml/min    see [place], Stage 1   colon ca dx'd 04/2016   colon   Hernia, abdominal    sx fixed   Hyperlipidemia    on meds   Hypertension    on meds   LBBB (left bundle  branch block) 08/18/2019   Neuropathy    Pneumonia 05/19/2018   Seasonal allergies    Thrombocytopenia (HCC)    during chemo.   Past Surgical History:  Procedure Laterality Date   BIOPSY  04/10/2016   Procedure: BIOPSY Mesentary;  Surgeon: Jimmye Norman, MD;  Location: [facility] OR;  Service: General;;   CARDIOVERSION N/A 09/04/2022   Procedure: CARDIOVERSION;  Surgeon: Sande Rives, MD;  Location: [facility] INVASIVE CV LAB;  Service: Cardiovascular;  Laterality: N/A;   COLONOSCOPY  2019   TA/tubulovillous adenoma   COLOSTOMY  2018   reversed   COLOSTOMY REVERSAL N/A 01/12/2017   Procedure: COLOSTOMY REVERSAL;  Surgeon: Jimmye Norman, MD;  Location: [facility] OR;  Service: General;  Laterality: N/A;   FLEXIBLE SIGMOIDOSCOPY N/A 04/09/2016   Procedure: FLEXIBLE SIGMOIDOSCOPY;  Surgeon: Meryl Dare, MD;  Location: [facility] ENDOSCOPY;  Service: Endoscopy;  Laterality: N/A;   HERNIA REPAIR     in childhood   INGUINAL HERNIA REPAIR Right 03/29/2018   Procedure: OPEN REPAIR OF RIGHT INGUINAL HERNIA WITH MESH;  Surgeon: Jimmye Norman, MD;  Location: [facility] OR;  Service: General;  Laterality: Right;   INSERTION OF MESH N/A 03/29/2018   Procedure: INSERTION OF MESH;  Surgeon: Jimmye Norman, MD;  Location: [facility] OR;  Service: General;  Laterality: N/A;   IR GENERIC HISTORICAL  05/14/2016   IR US GUIDE VASC ACCESS RIGHT 05/14/2016 Gilmer Mor, DO [facility]-INTERV RAD   IR GENERIC HISTORICAL  05/14/2016   IR FLUORO GUIDE  PORT INSERTION RIGHT 05/14/2016 Gilmer Mor, DO [facility]INTER[facility]   IR IMAGING GUIDED PORT INSERTION  12/17/2020   IR REMOVAL TUN ACCESS W/ PORT W/O FL MOD SED  11/11/2016   PARTIAL COLECTOMY N/A 04/10/2016   Procedure: SIGMOID  COLECTOMY WITH COLOSTOMY;  Surgeon: Jimmye Norman, MD;  Location: [facility] OR;  Service: General;  Laterality: N/A;   TEE WITHOUT CARDIOVERSION N/A 09/04/2022   Procedure: TRANSESOPHAGEAL ECHOCARDIOGRAM;  Surgeon: Sande Rives, MD;  Location: [facility] INVASIVE CV LAB;  Service: Cardiovascular;   Laterality: N/A;   TONSILLECTOMY     TOTAL KNEE ARTHROPLASTY Left 09/29/2019   Procedure: TOTAL KNEE ARTHROPLASTY;  Surgeon: Durene Romans, MD;  Location: [facility] ORS;  Service: Orthopedics;  Laterality: Left;  70 mins     Current Outpatient Medications  Medication Sig Dispense Refill   acetaminophen (TYLENOL) 325 MG tablet Tylenol     apixaban (ELIQUIS) 5 MG TABS tablet Take 1 tablet (5 mg total) by mouth 2 (two) times daily. 60 tablet 3   doxycycline (VIBRA-TABS) 100 MG tablet Take 100 mg by mouth 2 (two) times daily.     encorafenib (BRAFTOVI) 75 MG capsule Take 4 capsules (300 mg total) by mouth daily. 120 capsule 1   ferrous sulfate 325 (65 FE) MG EC tablet Take 1 tablet (325 mg total) by mouth 2 (two) times daily with a meal. 60 tablet 3   lidocaine-prilocaine (EMLA) cream Apply 1 Application topically as needed. Apply 1/2 tablespoon to port site 2 hours prior to stick and cover with plastic wrap to numb site. DO NOT USE UNTIL 14 DAYS AFTER PORT IS PLACED. 30 g 2   loratadine (CLARITIN) 10 MG tablet Take 10 mg by mouth daily as needed for allergies.      losartan (COZAAR) 25 MG tablet Take 25 mg by mouth daily.     Magnesium Oxide 400 MG CAPS Take 1 capsule (400 mg total) by mouth 2 (two) times daily. 60 capsule 2   metoprolol tartrate (LOPRESSOR) 25 MG tablet Take 2 tablets (50 mg total) by mouth 2 (two) times daily. 60 tablet 3   ondansetron (ZOFRAN) 8 MG tablet Take 1 tablet (8 mg total) by mouth every 8 (eight) hours as needed for nausea or vomiting. 30 tablet 0   prochlorperazine (COMPAZINE) 10 MG tablet Take 1 tablet (10 mg total) by mouth every 6 (six) hours as needed for nausea or vomiting. 30 tablet 3   No current facility-administered medications for this visit.   Facility-Administered Medications Ordered in Other Visits  Medication Dose Route Frequency Provider Last Rate Last Admin   sodium chloride flush (NS) 0.9 % injection 10 mL  10 mL Intravenous PRN Ladene Artist, MD   10  mL at 12/26/21 1010    Allergies:   Patient has no known allergies.   Social History:  The patient  reports that he has never smoked. He has never been exposed to tobacco smoke. He has never used smokeless tobacco. He reports current alcohol use of about 3.0 - 4.0 standard drinks of alcohol per week. He reports that he does not use drugs.   Family History:  The patient's family history includes Diverticulitis in his brother; Hyperlipidemia in his father.    ROS:  Please see the history of present illness.   Otherwise, review of systems is positive for none.   All other systems are reviewed and negative.    PHYSICAL EXAM: VS:  BP 116/78   Pulse 90   Ht  6\' 4"  (1.93 m)   Wt 216 lb (98 kg)   SpO2 97%   BMI 26.29 kg/m  , BMI Body mass index is 26.29 kg/m. GEN: Well nourished, well developed, in no acute distress  HEENT: normal  Neck: no JVD, carotid bruits, or masses Cardiac: RRR; no murmurs, rubs, or gallops,no edema  Respiratory:  clear to auscultation bilaterally, normal work of breathing GI: soft, nontender, nondistended, + BS MS: no deformity or atrophy  Skin: warm and dry Neuro:  Strength and sensation are intact Psych: euthymic mood, full affect  EKG:  EKG is not ordered today. Personal review of the ekg ordered 09/12/22 shows atrial flutter  Recent Labs: 09/02/2022: B Natriuretic Peptide 804.9; TSH 2.009 09/24/2022: ALT 9; BUN 33; Creatinine 2.43; Hemoglobin 9.7; Magnesium 1.7; Platelet Count 211; Potassium 4.3; Sodium 136    Lipid Panel     Component Value Date/Time   CHOL 159 07/02/2016 0808   TRIG 146 07/02/2016 0808   HDL 47 07/02/2016 0808   CHOLHDL 3.4 07/02/2016 0808   LDLCALC 83 07/02/2016 0808     Wt Readings from Last 3 Encounters:  10/01/22 216 lb (98 kg)  09/24/22 215 lb 11.2 oz (97.8 kg)  09/22/22 213 lb 9.6 oz (96.9 kg)      Other studies Reviewed: Additional studies/ records that were reviewed today include: TEE 09/04/22  Review of the above  records today demonstrates:   1. No left atrial/left atrial appendage thrombus was detected. The LAA  emptying velocity was 62 cm/s.   2. Aortic dilatation noted. There is mild dilatation of the aortic root,  measuring 42 mm. There is mild dilatation of the ascending aorta,  measuring 42 mm. There is Severe (Grade IV) protruding plaque involving  the aortic arch.   3. Left ventricular ejection fraction, by estimation, is 55 to 60%. The  left ventricle has normal function.   4. Right ventricular systolic function is normal. The right ventricular  size is normal.   5. The mitral valve is grossly normal. Mild mitral valve regurgitation.  No evidence of mitral stenosis.   6. The aortic valve is tricuspid. Aortic valve regurgitation is not  visualized. No aortic stenosis is present.    ASSESSMENT AND PLAN:  1.  Persistent atrial fibrillation/flutter: CHA2DS2-VASc of 4.  Currently on Eliquis.  He has stage IV colon cancer and is on multiple medications that are contraindicated for antiarrhythmics.  He would benefit from rhythm control as he feels quite poorly.  Due to that, we Corlene Sabia plan for ablation.  Risk and benefits have been discussed.  Risk include bleeding, tamponade, heart block, stroke, MI, renal failure, death.  He understands the risks and is agreed to the procedure.  2.  Second hypercoagulable state: Currently on Eliquis for atrial fibrillation  3.  Hypertension: Currently well-controlled   Current medicines are reviewed at length with the patient today.   The patient does not have concerns regarding his medicines.  The following changes were made today:  none  Labs/ tests ordered today include:  No orders of the defined types were placed in this encounter.    Disposition:   FU with Jillyan Plitt 3 months  Signed, Yafet Cline [name], MD  10/01/2022 11:00 AM     [facility] [address] [place] [address] [phone] (office) [phone]  (fax)

## 2022-10-01 NOTE — H&P (View-Only) (Signed)
 Electrophysiology Office Note   Date:  10/01/2022   ID:  Louis [name], DOB 06/11/1945, MRN [id number]  PCP:  Coladonato, Joseph, MD  Cardiologist:   Primary Electrophysiologist:  Dezarae Mcclaran Martin Mahrosh Donnell, MD    Chief Complaint: AF   History of Present Illness: Louis [name] is a 76 y.o. male who is being seen today for the evaluation of AF at the request of Suarez, Joseph J, PA-C. Presenting today for electrophysiology evaluation.  He has a history send hypertension, colon cancer, aortic atherosclerosis, stage IV CKD, left bundle branch block, ascending aortic aneurysm, atrial fibrillation.  He was diagnosed with atrial fibrillation 09/02/2022 and he presented to the emergency room with shortness of breath and palpitations.  He underwent cardioversion 09/04/2022.  He again went to the emergency room 09/12/2022 and underwent cardioversion from atrial flutter to sinus rhythm.  Had a third admission to the emergency room 09/14/2022 but converted to sinus rhythm.  He then presented to atrial fibrillation clinic in atrial flutter.  Today, he denies symptoms of palpitations, chest pain, shortness of breath, orthopnea, PND, lower extremity edema, claudication, dizziness, presyncope, syncope, bleeding, or neurologic sequela. The patient is tolerating medications without difficulties.  He does feel quite poorly when he is in atrial flutter with fatigue and shortness of breath.  He finds difficult to do his daily activities.  He prefer a rhythm control strategy.   Past Medical History:  Diagnosis Date   Allergy    seasonal allergies   Anemia    Arthritis 2010   Ascending aorta dilatation (HCC)    4.1 cm noted on CT1/15/21   Blood transfusion without reported diagnosis    CKD (chronic kidney disease) stage 2, GFR 60-89 ml/min    see Coladonato, Stage 1   colon ca dx'd 04/2016   colon   Hernia, abdominal    sx fixed   Hyperlipidemia    on meds   Hypertension    on meds   LBBB (left bundle  branch block) 08/18/2019   Neuropathy    Pneumonia 05/19/2018   Seasonal allergies    Thrombocytopenia (HCC)    during chemo.   Past Surgical History:  Procedure Laterality Date   BIOPSY  04/10/2016   Procedure: BIOPSY Mesentary;  Surgeon: James Wyatt, MD;  Location: [facility];  Service: General;;   CARDIOVERSION N/A 09/04/2022   Procedure: CARDIOVERSION;  Surgeon: O'Neal, Danbury Thomas, MD;  Location: [facility];  Service: Cardiovascular;  Laterality: N/A;   COLONOSCOPY  2019   TA/tubulovillous adenoma   COLOSTOMY  2018   reversed   COLOSTOMY REVERSAL N/A 01/12/2017   Procedure: COLOSTOMY REVERSAL;  Surgeon: Wyatt, James, MD;  Location: [facility];  Service: General;  Laterality: N/A;   FLEXIBLE SIGMOIDOSCOPY N/A 04/09/2016   Procedure: FLEXIBLE SIGMOIDOSCOPY;  Surgeon: Malcolm T Stark, MD;  Location: [facility];  Service: Endoscopy;  Laterality: N/A;   HERNIA REPAIR     in childhood   INGUINAL HERNIA REPAIR Right 03/29/2018   Procedure: OPEN REPAIR OF RIGHT INGUINAL HERNIA WITH MESH;  Surgeon: Wyatt, James, MD;  Location: [facility];  Service: General;  Laterality: Right;   INSERTION OF MESH N/A 03/29/2018   Procedure: INSERTION OF MESH;  Surgeon: Wyatt, James, MD;  Location: [facility];  Service: General;  Laterality: N/A;   IR GENERIC HISTORICAL  05/14/2016   IR US GUIDE VASC ACCESS RIGHT 05/14/2016 Jaime Wagner, DO [facility]   IR GENERIC HISTORICAL  05/14/2016   IR FLUORO GUIDE   PORT INSERTION RIGHT 05/14/2016 Jaime Wagner, DO [facility]   IR IMAGING GUIDED PORT INSERTION  12/17/2020   IR REMOVAL TUN ACCESS W/ PORT W/O FL MOD SED  11/11/2016   PARTIAL COLECTOMY N/A 04/10/2016   Procedure: SIGMOID  COLECTOMY WITH COLOSTOMY;  Surgeon: James Wyatt, MD;  Location: [facility];  Service: General;  Laterality: N/A;   TEE WITHOUT CARDIOVERSION N/A 09/04/2022   Procedure: TRANSESOPHAGEAL ECHOCARDIOGRAM;  Surgeon: O'Neal, Edison Thomas, MD;  [facility];  Service: Cardiovascular;   Laterality: N/A;   TONSILLECTOMY     TOTAL KNEE ARTHROPLASTY Left 09/29/2019   Procedure: TOTAL KNEE ARTHROPLASTY;  Surgeon: Olin, Matthew, MD;  Location: [facility];  Service: Orthopedics;  Laterality: Left;  70 mins     Current Outpatient Medications  Medication Sig Dispense Refill   acetaminophen (TYLENOL) 325 MG tablet Tylenol     apixaban (ELIQUIS) 5 MG TABS tablet Take 1 tablet (5 mg total) by mouth 2 (two) times daily. 60 tablet 3   doxycycline (VIBRA-TABS) 100 MG tablet Take 100 mg by mouth 2 (two) times daily.     encorafenib (BRAFTOVI) 75 MG capsule Take 4 capsules (300 mg total) by mouth daily. 120 capsule 1   ferrous sulfate 325 (65 FE) MG EC tablet Take 1 tablet (325 mg total) by mouth 2 (two) times daily with a meal. 60 tablet 3   lidocaine-prilocaine (EMLA) cream Apply 1 Application topically as needed. Apply 1/2 tablespoon to port site 2 hours prior to stick and cover with plastic wrap to numb site. DO NOT USE UNTIL 14 DAYS AFTER PORT IS PLACED. 30 g 2   loratadine (CLARITIN) 10 MG tablet Take 10 mg by mouth daily as needed for allergies.      losartan (COZAAR) 25 MG tablet Take 25 mg by mouth daily.     Magnesium Oxide 400 MG CAPS Take 1 capsule (400 mg total) by mouth 2 (two) times daily. 60 capsule 2   metoprolol tartrate (LOPRESSOR) 25 MG tablet Take 2 tablets (50 mg total) by mouth 2 (two) times daily. 60 tablet 3   ondansetron (ZOFRAN) 8 MG tablet Take 1 tablet (8 mg total) by mouth every 8 (eight) hours as needed for nausea or vomiting. 30 tablet 0   prochlorperazine (COMPAZINE) 10 MG tablet Take 1 tablet (10 mg total) by mouth every 6 (six) hours as needed for nausea or vomiting. 30 tablet 3   No current facility-administered medications for this visit.   Facility-Administered Medications Ordered in Other Visits  Medication Dose Route Frequency Provider Last Rate Last Admin   sodium chloride flush (NS) 0.9 % injection 10 mL  10 mL Intravenous PRN Sherrill, Gary B, MD   10  mL at 12/26/21 1010    Allergies:   Patient has no known allergies.   Social History:  The patient  reports that he has never smoked. He has never been exposed to tobacco smoke. He has never used smokeless tobacco. He reports current alcohol use of about 3.0 - 4.0 standard drinks of alcohol per week. He reports that he does not use drugs.   Family History:  The patient's family history includes Diverticulitis in his brother; Hyperlipidemia in his father.    ROS:  Please see the history of present illness.   Otherwise, review of systems is positive for none.   All other systems are reviewed and negative.    PHYSICAL EXAM: VS:  BP 116/78   Pulse 90   Ht   6' 4" (1.93 m)   Wt 216 lb (98 kg)   SpO2 97%   BMI 26.29 kg/m  , BMI Body mass index is 26.29 kg/m. GEN: Well nourished, well developed, in no acute distress  HEENT: normal  Neck: no JVD, carotid bruits, or masses Cardiac: RRR; no murmurs, rubs, or gallops,no edema  Respiratory:  clear to auscultation bilaterally, normal work of breathing GI: soft, nontender, nondistended, + BS MS: no deformity or atrophy  Skin: warm and dry Neuro:  Strength and sensation are intact Psych: euthymic mood, full affect  EKG:  EKG is not ordered today. Personal review of the ekg ordered 09/12/22 shows atrial flutter  Recent Labs: 09/02/2022: B Natriuretic Peptide 804.9; TSH 2.009 09/24/2022: ALT 9; BUN 33; Creatinine 2.43; Hemoglobin 9.7; Magnesium 1.7; Platelet Count 211; Potassium 4.3; Sodium 136    Lipid Panel     Component Value Date/Time   CHOL 159 07/02/2016 0808   TRIG 146 07/02/2016 0808   HDL 47 07/02/2016 0808   CHOLHDL 3.4 07/02/2016 0808   LDLCALC 83 07/02/2016 0808     Wt Readings from Last 3 Encounters:  10/01/22 216 lb (98 kg)  09/24/22 215 lb 11.2 oz (97.8 kg)  09/22/22 213 lb 9.6 oz (96.9 kg)      Other studies Reviewed: Additional studies/ records that were reviewed today include: TEE 09/04/22  Review of the above  records today demonstrates:   1. No left atrial/left atrial appendage thrombus was detected. The LAA  emptying velocity was 62 cm/s.   2. Aortic dilatation noted. There is mild dilatation of the aortic root,  measuring 42 mm. There is mild dilatation of the ascending aorta,  measuring 42 mm. There is Severe (Grade IV) protruding plaque involving  the aortic arch.   3. Left ventricular ejection fraction, by estimation, is 55 to 60%. The  left ventricle has normal function.   4. Right ventricular systolic function is normal. The right ventricular  size is normal.   5. The mitral valve is grossly normal. Mild mitral valve regurgitation.  No evidence of mitral stenosis.   6. The aortic valve is tricuspid. Aortic valve regurgitation is not  visualized. No aortic stenosis is present.    ASSESSMENT AND PLAN:  1.  Persistent atrial fibrillation/flutter: CHA2DS2-VASc of 4.  Currently on Eliquis.  He has stage IV colon cancer and is on multiple medications that are contraindicated for antiarrhythmics.  He would benefit from rhythm control as he feels quite poorly.  Due to that, we Lawerance Matsuo plan for ablation.  Risk and benefits have been discussed.  Risk include bleeding, tamponade, heart block, stroke, MI, renal failure, death.  He understands the risks and is agreed to the procedure.  2.  Second hypercoagulable state: Currently on Eliquis for atrial fibrillation  3.  Hypertension: Currently well-controlled   Current medicines are reviewed at length with the patient today.   The patient does not have concerns regarding his medicines.  The following changes were made today:  none  Labs/ tests ordered today include:  No orders of the defined types were placed in this encounter.    Disposition:   FU with Risa Auman 3 months  Signed, Bracha Frankowski Martin Zohar Maroney, MD  10/01/2022 11:00 AM     [facility] [address] [place] [address] [phone] (office) [phone]  (fax)  

## 2022-10-01 NOTE — Patient Instructions (Addendum)
Medication Instructions:  Your physician recommends that you continue on your current medications as directed. Please refer to the Current Medication list given to you today.  *If you need a refill on your cardiac medications before your next appointment, please call your pharmacy*   Lab Work: None ordered If you have labs (blood work) drawn today and your tests are completely normal, you will receive your results only by: MyChart Message (if you have MyChart) OR A paper copy in the mail If you have any lab test that is abnormal or we need to change your treatment, we will call you to review the results.   Testing/Procedures: Your physician has recommended that you have an ablation. Catheter ablation is a medical procedure used to treat some cardiac arrhythmias (irregular heartbeats). During catheter ablation, a long, thin, flexible tube is put into a blood vessel in your groin (upper thigh), or neck. This tube is called an ablation catheter. It is then guided to your heart through the blood vessel. Radio frequency waves destroy small areas of heart tissue where abnormal heartbeats may cause an arrhythmia to start. Please see the instruction sheet given to you today.  We will contact you with a date once we hear back from the hospital/anesthesia   Follow-Up: At [facility], you and your health needs are our priority.  As part of our continuing mission to provide you with exceptional heart care, we have created designated Provider Care Teams.  These Care Teams include your primary Cardiologist (physician) and Advanced Practice Providers (APPs -  Physician Assistants and Nurse Practitioners) who all work together to provide you with the care you need, when you need it.  Your next appointment:   4 week(s) after ablation  The format for your next appointment:   In Person  Provider:   Loman Brooklyn, MD{    Thank you for choosing [facility]!!   Dory Horn, RN [phone]

## 2022-10-02 ENCOUNTER — Telehealth: Payer: Self-pay | Admitting: Cardiology

## 2022-10-02 DIAGNOSIS — I483 Typical atrial flutter: Secondary | ICD-10-CM

## 2022-10-02 NOTE — Telephone Encounter (Signed)
Patient is calling requesting a callback from RN Sherri to see if she had gotten a date for the ablation that is to be scheduled.  Please advise.

## 2022-10-02 NOTE — Telephone Encounter (Signed)
Informed pt that anesthesia said no to the one date we were looking at. Aware that EP scheduler will follow up tomorrow to arrange ablation for possibly 6/7. Pt appreciates the return call and looks  forward to her call tomorrow.

## 2022-10-03 NOTE — Telephone Encounter (Signed)
Pt is scheduled on 6/7 for his Aflutter Ablation.Marland Kitchen He will have labs drawn on 6/3 - he will get instruction letter when he comes in that day.

## 2022-10-06 ENCOUNTER — Ambulatory Visit: Payer: Medicare Other | Attending: Cardiovascular Disease

## 2022-10-06 DIAGNOSIS — I483 Typical atrial flutter: Secondary | ICD-10-CM | POA: Diagnosis not present

## 2022-10-07 ENCOUNTER — Encounter: Payer: Self-pay | Admitting: Oncology

## 2022-10-07 DIAGNOSIS — I4892 Unspecified atrial flutter: Secondary | ICD-10-CM | POA: Diagnosis not present

## 2022-10-07 LAB — CBC
Hematocrit: 31.5 % — ABNORMAL LOW (ref 37.5–51.0)
Hemoglobin: 9.7 g/dL — ABNORMAL LOW (ref 13.0–17.7)
MCH: 26.4 pg — ABNORMAL LOW (ref 26.6–33.0)
MCHC: 30.8 g/dL — ABNORMAL LOW (ref 31.5–35.7)
MCV: 86 fL (ref 79–97)
Platelets: 205 10*3/uL (ref 150–450)
RBC: 3.67 x10E6/uL — ABNORMAL LOW (ref 4.14–5.80)
RDW: 16.4 % — ABNORMAL HIGH (ref 11.6–15.4)
WBC: 6.4 10*3/uL (ref 3.4–10.8)

## 2022-10-07 LAB — BASIC METABOLIC PANEL
BUN/Creatinine Ratio: 17 (ref 10–24)
BUN: 41 mg/dL — ABNORMAL HIGH (ref 8–27)
CO2: 16 mmol/L — ABNORMAL LOW (ref 20–29)
Calcium: 8.3 mg/dL — ABNORMAL LOW (ref 8.6–10.2)
Chloride: 111 mmol/L — ABNORMAL HIGH (ref 96–106)
Creatinine, Ser: 2.39 mg/dL — ABNORMAL HIGH (ref 0.76–1.27)
Glucose: 116 mg/dL — ABNORMAL HIGH (ref 70–99)
Potassium: 4.8 mmol/L (ref 3.5–5.2)
Sodium: 138 mmol/L (ref 134–144)
eGFR: 28 mL/min/{1.73_m2} — ABNORMAL LOW (ref >59)

## 2022-10-08 ENCOUNTER — Telehealth: Payer: Self-pay | Admitting: Cardiology

## 2022-10-08 ENCOUNTER — Other Ambulatory Visit: Payer: Self-pay | Admitting: *Deleted

## 2022-10-08 DIAGNOSIS — E785 Hyperlipidemia, unspecified: Secondary | ICD-10-CM | POA: Diagnosis not present

## 2022-10-08 DIAGNOSIS — N2581 Secondary hyperparathyroidism of renal origin: Secondary | ICD-10-CM | POA: Diagnosis not present

## 2022-10-08 DIAGNOSIS — I4892 Unspecified atrial flutter: Secondary | ICD-10-CM | POA: Diagnosis not present

## 2022-10-08 DIAGNOSIS — D631 Anemia in chronic kidney disease: Secondary | ICD-10-CM | POA: Diagnosis not present

## 2022-10-08 DIAGNOSIS — I129 Hypertensive chronic kidney disease with stage 1 through stage 4 chronic kidney disease, or unspecified chronic kidney disease: Secondary | ICD-10-CM | POA: Diagnosis not present

## 2022-10-08 DIAGNOSIS — I4891 Unspecified atrial fibrillation: Secondary | ICD-10-CM | POA: Diagnosis not present

## 2022-10-08 DIAGNOSIS — N1832 Chronic kidney disease, stage 3b: Secondary | ICD-10-CM | POA: Diagnosis not present

## 2022-10-08 MED ORDER — ENCORAFENIB 75 MG PO CAPS: 300.0000 mg | ORAL_CAPSULE | Freq: Every day | ORAL | 1 refills | Status: DC

## 2022-10-08 NOTE — Telephone Encounter (Signed)
Pt called in to see if it is ok that he goes to his chemo appointments post AFL abaltion scheduled for June 7th, this Friday. Please advise.

## 2022-10-08 NOTE — Telephone Encounter (Signed)
Pt is scheduled for infusion 10/15/2022.  Will forward to Dr Elberta Fortis for recommendation.

## 2022-10-09 DIAGNOSIS — R3912 Poor urinary stream: Secondary | ICD-10-CM | POA: Diagnosis not present

## 2022-10-09 DIAGNOSIS — N401 Enlarged prostate with lower urinary tract symptoms: Secondary | ICD-10-CM | POA: Diagnosis not present

## 2022-10-09 DIAGNOSIS — R3914 Feeling of incomplete bladder emptying: Secondary | ICD-10-CM | POA: Diagnosis not present

## 2022-10-09 DIAGNOSIS — R351 Nocturia: Secondary | ICD-10-CM | POA: Diagnosis not present

## 2022-10-09 NOTE — Telephone Encounter (Signed)
Returned call to patient and shared with him Dr. Gershon Crane response regarding chemo infusions after ablation procedure:  Should be ok to go to infusion    Patient verbalized understanding and expressed appreciation for call.

## 2022-10-09 NOTE — Pre-Procedure Instructions (Signed)
Attempted to call patient regarding procedure instructions.  Left voicemail on the following items: Arrival time 1100 Nothing to eat or drink after midnight No meds AM of procedure Responsible person to drive you home and stay with you for 24 hrs  Have you missed any doses of anti-coagulant Eliquis- Should be taken it twice a day, of you have missed any doses please let office know.  Don't take in the morning.

## 2022-10-10 ENCOUNTER — Ambulatory Visit (HOSPITAL_COMMUNITY)
Admission: RE | Admit: 2022-10-10 | Discharge: 2022-10-10 | Disposition: A | Discharge status: Home / Self Care | Payer: Medicare Other | Attending: Cardiology | Admitting: Cardiology

## 2022-10-10 ENCOUNTER — Encounter (HOSPITAL_COMMUNITY)
Admission: RE | Disposition: A | Discharge status: Home / Self Care | Payer: Self-pay | Source: Home / Self Care | Attending: Cardiology

## 2022-10-10 ENCOUNTER — Other Ambulatory Visit: Payer: Self-pay

## 2022-10-10 ENCOUNTER — Ambulatory Visit (HOSPITAL_COMMUNITY): Payer: Medicare Other | Admitting: Anesthesiology

## 2022-10-10 ENCOUNTER — Encounter (HOSPITAL_COMMUNITY): Payer: Self-pay | Admitting: Cardiology

## 2022-10-10 ENCOUNTER — Ambulatory Visit (HOSPITAL_BASED_OUTPATIENT_CLINIC_OR_DEPARTMENT_OTHER): Payer: Medicare Other | Admitting: Anesthesiology

## 2022-10-10 DIAGNOSIS — I700 Atherosclerosis of aorta: Secondary | ICD-10-CM | POA: Diagnosis not present

## 2022-10-10 DIAGNOSIS — Z7901 Long term (current) use of anticoagulants: Secondary | ICD-10-CM | POA: Diagnosis not present

## 2022-10-10 DIAGNOSIS — I10 Essential (primary) hypertension: Secondary | ICD-10-CM

## 2022-10-10 DIAGNOSIS — I447 Left bundle-branch block, unspecified: Secondary | ICD-10-CM | POA: Diagnosis not present

## 2022-10-10 DIAGNOSIS — N184 Chronic kidney disease, stage 4 (severe): Secondary | ICD-10-CM | POA: Insufficient documentation

## 2022-10-10 DIAGNOSIS — I4892 Unspecified atrial flutter: Secondary | ICD-10-CM

## 2022-10-10 DIAGNOSIS — I739 Peripheral vascular disease, unspecified: Secondary | ICD-10-CM | POA: Diagnosis not present

## 2022-10-10 DIAGNOSIS — D649 Anemia, unspecified: Secondary | ICD-10-CM

## 2022-10-10 DIAGNOSIS — I4819 Other persistent atrial fibrillation: Secondary | ICD-10-CM | POA: Insufficient documentation

## 2022-10-10 DIAGNOSIS — I7121 Aneurysm of the ascending aorta, without rupture: Secondary | ICD-10-CM | POA: Insufficient documentation

## 2022-10-10 DIAGNOSIS — I129 Hypertensive chronic kidney disease with stage 1 through stage 4 chronic kidney disease, or unspecified chronic kidney disease: Secondary | ICD-10-CM | POA: Diagnosis not present

## 2022-10-10 DIAGNOSIS — D6859 Other primary thrombophilia: Secondary | ICD-10-CM | POA: Insufficient documentation

## 2022-10-10 DIAGNOSIS — C189 Malignant neoplasm of colon, unspecified: Secondary | ICD-10-CM | POA: Diagnosis not present

## 2022-10-10 HISTORY — PX: A-FLUTTER ABLATION: EP1230

## 2022-10-10 SURGERY — A-FLUTTER ABLATION
Anesthesia: General

## 2022-10-10 MED ORDER — PHENYLEPHRINE HCL-NACL 20-0.9 MG/250ML-% IV SOLN
INTRAVENOUS | Status: DC | PRN
  Administered 2022-10-10: 25 ug/min via INTRAVENOUS

## 2022-10-10 MED ORDER — PHENYLEPHRINE 80 MCG/ML (10ML) SYRINGE FOR IV PUSH (FOR BLOOD PRESSURE SUPPORT)
PREFILLED_SYRINGE | INTRAVENOUS | Status: DC | PRN
  Administered 2022-10-10: 160 ug via INTRAVENOUS
  Administered 2022-10-10: 80 ug via INTRAVENOUS
  Administered 2022-10-10: 160 ug via INTRAVENOUS
  Administered 2022-10-10: 80 ug via INTRAVENOUS
  Administered 2022-10-10: 160 ug via INTRAVENOUS

## 2022-10-10 MED ORDER — HEPARIN (PORCINE) IN NACL 1000-0.9 UT/500ML-% IV SOLN
INTRAVENOUS | Status: DC | PRN
  Administered 2022-10-10: 500 mL

## 2022-10-10 MED ORDER — SODIUM CHLORIDE 0.9 % IV SOLN: 250.0000 mL | INTRAVENOUS | Status: DC | PRN

## 2022-10-10 MED ORDER — LIDOCAINE 2% (20 MG/ML) 5 ML SYRINGE
INTRAMUSCULAR | Status: DC | PRN
  Administered 2022-10-10: 20 mg via INTRAVENOUS

## 2022-10-10 MED ORDER — SUGAMMADEX SODIUM 200 MG/2ML IV SOLN
INTRAVENOUS | Status: DC | PRN
  Administered 2022-10-10: 200 mg via INTRAVENOUS

## 2022-10-10 MED ORDER — DEXAMETHASONE SODIUM PHOSPHATE 10 MG/ML IJ SOLN
INTRAMUSCULAR | Status: DC | PRN
  Administered 2022-10-10: 10 mg via INTRAVENOUS

## 2022-10-10 MED ORDER — SODIUM CHLORIDE 0.9 % IV SOLN: INTRAVENOUS | Status: DC

## 2022-10-10 MED ORDER — ROCURONIUM BROMIDE 10 MG/ML (PF) SYRINGE
PREFILLED_SYRINGE | INTRAVENOUS | Status: DC | PRN
  Administered 2022-10-10: 70 mg via INTRAVENOUS

## 2022-10-10 MED ORDER — FENTANYL CITRATE (PF) 250 MCG/5ML IJ SOLN
INTRAMUSCULAR | Status: DC | PRN
  Administered 2022-10-10: 100 ug via INTRAVENOUS

## 2022-10-10 MED ORDER — ACETAMINOPHEN 325 MG PO TABS: 650.0000 mg | ORAL_TABLET | ORAL | Status: DC | PRN

## 2022-10-10 MED ORDER — ONDANSETRON HCL 4 MG/2ML IJ SOLN: 4.0000 mg | Freq: Four times a day (QID) | INTRAMUSCULAR | Status: DC | PRN

## 2022-10-10 MED ORDER — ACETAMINOPHEN 500 MG PO TABS
1000.0000 mg | ORAL_TABLET | Freq: Once | ORAL | Status: AC
  Administered 2022-10-10: 1000 mg via ORAL
  Filled 2022-10-10: qty 2

## 2022-10-10 MED ORDER — PROPOFOL 10 MG/ML IV BOLUS
INTRAVENOUS | Status: DC | PRN
  Administered 2022-10-10: 130 mg via INTRAVENOUS

## 2022-10-10 MED ORDER — SODIUM CHLORIDE 0.9% FLUSH: 3.0000 mL | INTRAVENOUS | Status: DC | PRN

## 2022-10-10 MED ORDER — ONDANSETRON HCL 4 MG/2ML IJ SOLN
INTRAMUSCULAR | Status: DC | PRN
  Administered 2022-10-10: 4 mg via INTRAVENOUS

## 2022-10-10 SURGICAL SUPPLY — 10 items
CATH EZ STEER NAV 8MM F-J CUR (ABLATOR) IMPLANT
CATH WEB BI DIR CSDF CRV REPRO (CATHETERS) IMPLANT
CLOSURE MYNX CONTROL 6F/7F (Vascular Products) IMPLANT
PACK EP LATEX FREE (CUSTOM PROCEDURE TRAY) ×1
PACK EP LF (CUSTOM PROCEDURE TRAY) ×1 IMPLANT
PAD DEFIB RADIO PHYSIO CONN (PAD) ×1 IMPLANT
PATCH CARTO3 (PAD) IMPLANT
SHEATH PINNACLE 7F 10CM (SHEATH) IMPLANT
SHEATH PINNACLE 8F 10CM (SHEATH) IMPLANT
SHEATH PROBE COVER 6X72 (BAG) IMPLANT

## 2022-10-10 NOTE — Anesthesia Postprocedure Evaluation (Signed)
Anesthesia Post Note  Patient: Louis [name]  Procedure(s) Performed: A-FLUTTER ABLATION     Patient location during evaluation: PACU Anesthesia Type: General Level of consciousness: awake and alert, patient cooperative and oriented Pain management: pain level controlled Vital Signs Assessment: post-procedure vital signs reviewed and stable Respiratory status: spontaneous breathing, nonlabored ventilation and respiratory function stable Cardiovascular status: blood pressure returned to baseline and stable Postop Assessment: no apparent nausea or vomiting Anesthetic complications: no   No notable events documented.  Last Vitals:  Vitals:   10/10/22 1217 10/10/22 1516  BP: (!) 156/76 138/73  Pulse: (!) 134 80  Resp: 18 18  Temp: 36.8 C   SpO2: 98% 98%    Last Pain:  Vitals:   10/10/22 1516  TempSrc:   PainSc: 0-No pain                 Priyansh Pry,E. Samia Kukla

## 2022-10-10 NOTE — Transfer of Care (Signed)
Immediate Anesthesia Transfer of Care Note  Patient: Louis [name]  Procedure(s) Performed: A-FLUTTER ABLATION  Patient Location: Cath Lab  Anesthesia Type:General  Level of Consciousness: awake, alert , and oriented  Airway & Oxygen Therapy: Patient Spontanous Breathing  Post-op Assessment: Report given to RN and Post -op Vital signs reviewed and stable  Post vital signs: Reviewed and stable  Last Vitals:  Vitals Value Taken Time  BP 132/69 10/10/22 1406  Temp    Pulse 76 10/10/22 1411  Resp 11 10/10/22 1411  SpO2 96 % 10/10/22 1411  Vitals shown include unvalidated device data.  Last Pain:  Vitals:   10/10/22 1217  TempSrc: Temporal  PainSc: 0-No pain         Complications: No notable events documented.

## 2022-10-10 NOTE — Anesthesia Preprocedure Evaluation (Addendum)
Anesthesia Evaluation  Patient identified by MRN, date of birth, ID band Patient awake    Reviewed: Allergy & Precautions, NPO status , Patient's Chart, lab work & pertinent test results, reviewed documented beta blocker date and time   History of Anesthesia Complications Negative for: history of anesthetic complications  Airway Mallampati: II  TM Distance: >3 FB Neck ROM: Full    Dental  (+) Dental Advisory Given   Pulmonary neg pulmonary ROS   breath sounds clear to auscultation       Cardiovascular hypertension, Pt. on medications and Pt. on home beta blockers (-) angina + Peripheral Vascular Disease  + dysrhythmias Atrial Fibrillation  Rhythm:Irregular Rate:Normal  09/04/2022 ECHO:  1. No left atrial/left atrial appendage thrombus was detected. The LAA emptying velocity was 62 cm/s.   2. Aortic dilatation noted. There is mild dilatation of the aortic root, 42 mm. There is mild dilatation of the ascending aorta, measuring 42 mm. 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   3. Left ventricular ejection fraction, by estimation, is 55 to 60%. Normal LVF   4. Right ventricular systolic function is normal. The right ventricular size is normal.   5. The mitral valve is grossly normal. Mild MR. No evidence of mitral stenosis.   6. The aortic valve is tricuspid. Aortic valve regurgitation is not visualized. No aortic stenosis is present.     Neuro/Psych negative neurological ROS     GI/Hepatic Neg liver ROS,,,adenoCa colon: chemo   Endo/Other  negative endocrine ROS    Renal/GU Renal InsufficiencyRenal disease     Musculoskeletal   Abdominal   Peds  Hematology  (+) Blood dyscrasia (Hb 9.7, plt 205k), anemia Eliquis   Anesthesia Other Findings   Reproductive/Obstetrics                             Anesthesia Physical Anesthesia Plan  ASA: 3  Anesthesia Plan:  General   Post-op Pain Management: Tylenol PO (pre-op)*   Induction: Intravenous  PONV Risk Score and Plan: 2 and Ondansetron and Dexamethasone  Airway Management Planned: Oral ETT  Additional Equipment: None  Intra-op Plan:   Post-operative Plan: Extubation in OR  Informed Consent: I have reviewed the patients History and Physical, chart, labs and discussed the procedure including the risks, benefits and alternatives for the proposed anesthesia with the patient or authorized representative who has indicated his/her understanding and acceptance.     Dental advisory given  Plan Discussed with: CRNA and Surgeon  Anesthesia Plan Comments:         Anesthesia Quick Evaluation

## 2022-10-10 NOTE — Discharge Instructions (Signed)

## 2022-10-10 NOTE — Interval H&P Note (Signed)
History and Physical Interval Note:  10/10/2022 12:08 PM  Louis [name]  has presented today for surgery, with the diagnosis of atrial flutter.  The various methods of treatment have been discussed with the patient and family. After consideration of risks, benefits and other options for treatment, the patient has consented to  Procedure(s): A-FLUTTER ABLATION (N/A) as a surgical intervention.  The patient's history has been reviewed, patient examined, no change in status, stable for surgery.  I have reviewed the patient's chart and labs.  Questions were answered to the patient's satisfaction.     Joci Dress [employer]

## 2022-10-10 NOTE — Anesthesia Procedure Notes (Signed)
Procedure Name: Intubation Date/Time: 10/10/2022 12:59 PM  Performed by: Garfield Cornea, CRNAPre-anesthesia Checklist: Patient identified, Emergency Drugs available, Suction available and Patient being monitored Patient Re-evaluated:Patient Re-evaluated prior to induction Oxygen Delivery Method: Circle System Utilized Preoxygenation: Pre-oxygenation with 100% oxygen Induction Type: IV induction Ventilation: Mask ventilation without difficulty Laryngoscope Size: Mac and 4 Grade View: Grade I Tube type: Oral Tube size: 7.0 mm Number of attempts: 1 Airway Equipment and Method: Stylet Placement Confirmation: ETT inserted through vocal cords under direct vision, positive ETCO2 and breath sounds checked- equal and bilateral Secured at: 22 cm Tube secured with: Tape Dental Injury: Teeth and Oropharynx as per pre-operative assessment

## 2022-10-12 ENCOUNTER — Other Ambulatory Visit: Payer: Self-pay | Admitting: Oncology

## 2022-10-13 ENCOUNTER — Encounter (HOSPITAL_COMMUNITY): Payer: Self-pay | Admitting: Cardiology

## 2022-10-15 ENCOUNTER — Inpatient Hospital Stay: Payer: Medicare Other

## 2022-10-15 ENCOUNTER — Inpatient Hospital Stay: Payer: Medicare Other | Attending: Oncology

## 2022-10-15 ENCOUNTER — Inpatient Hospital Stay (HOSPITAL_BASED_OUTPATIENT_CLINIC_OR_DEPARTMENT_OTHER): Payer: Medicare Other | Admitting: Nurse Practitioner

## 2022-10-15 ENCOUNTER — Encounter: Payer: Self-pay | Admitting: Nurse Practitioner

## 2022-10-15 ENCOUNTER — Encounter: Payer: Self-pay | Admitting: *Deleted

## 2022-10-15 VITALS — BP 143/62 | HR 63 | Resp 18

## 2022-10-15 VITALS — BP 155/79 | HR 63 | Temp 97.7°F | Resp 18 | Wt 209.0 lb

## 2022-10-15 DIAGNOSIS — C180 Malignant neoplasm of cecum: Secondary | ICD-10-CM | POA: Diagnosis not present

## 2022-10-15 DIAGNOSIS — Z5112 Encounter for antineoplastic immunotherapy: Secondary | ICD-10-CM | POA: Diagnosis not present

## 2022-10-15 DIAGNOSIS — C187 Malignant neoplasm of sigmoid colon: Secondary | ICD-10-CM | POA: Diagnosis not present

## 2022-10-15 DIAGNOSIS — I129 Hypertensive chronic kidney disease with stage 1 through stage 4 chronic kidney disease, or unspecified chronic kidney disease: Secondary | ICD-10-CM | POA: Insufficient documentation

## 2022-10-15 DIAGNOSIS — N189 Chronic kidney disease, unspecified: Secondary | ICD-10-CM | POA: Diagnosis not present

## 2022-10-15 DIAGNOSIS — C189 Malignant neoplasm of colon, unspecified: Secondary | ICD-10-CM

## 2022-10-15 DIAGNOSIS — Z95828 Presence of other vascular implants and grafts: Secondary | ICD-10-CM

## 2022-10-15 DIAGNOSIS — D6959 Other secondary thrombocytopenia: Secondary | ICD-10-CM | POA: Diagnosis not present

## 2022-10-15 DIAGNOSIS — D649 Anemia, unspecified: Secondary | ICD-10-CM | POA: Insufficient documentation

## 2022-10-15 LAB — CBC WITH DIFFERENTIAL (CANCER CENTER ONLY)
Abs Immature Granulocytes: 0.01 10*3/uL (ref 0.00–0.07)
Basophils Absolute: 0 10*3/uL (ref 0.0–0.1)
Basophils Relative: 0 %
Eosinophils Absolute: 0.2 10*3/uL (ref 0.0–0.5)
Eosinophils Relative: 3 %
HCT: 30.8 % — ABNORMAL LOW (ref 39.0–52.0)
Hemoglobin: 9.7 g/dL — ABNORMAL LOW (ref 13.0–17.0)
Immature Granulocytes: 0 %
Lymphocytes Relative: 17 %
Lymphs Abs: 1.1 10*3/uL (ref 0.7–4.0)
MCH: 26.8 pg (ref 26.0–34.0)
MCHC: 31.5 g/dL (ref 30.0–36.0)
MCV: 85.1 fL (ref 80.0–100.0)
Monocytes Absolute: 1.1 10*3/uL — ABNORMAL HIGH (ref 0.1–1.0)
Monocytes Relative: 16 %
Neutro Abs: 4.4 10*3/uL (ref 1.7–7.7)
Neutrophils Relative %: 64 %
Platelet Count: 177 10*3/uL (ref 150–400)
RBC: 3.62 MIL/uL — ABNORMAL LOW (ref 4.22–5.81)
RDW: 17.3 % — ABNORMAL HIGH (ref 11.5–15.5)
WBC Count: 6.8 10*3/uL (ref 4.0–10.5)
nRBC: 0 % (ref 0.0–0.2)

## 2022-10-15 LAB — CEA (ACCESS): CEA (CHCC): 2.03 ng/mL (ref 0.00–5.00)

## 2022-10-15 LAB — CMP (CANCER CENTER ONLY)
ALT: <5 U/L (ref 0–44)
AST: 6 U/L — ABNORMAL LOW (ref 15–41)
Albumin: 3.3 g/dL — ABNORMAL LOW (ref 3.5–5.0)
Alkaline Phosphatase: 99 U/L (ref 38–126)
Anion gap: 7 (ref 5–15)
BUN: 46 mg/dL — ABNORMAL HIGH (ref 8–23)
CO2: 17 mmol/L — ABNORMAL LOW (ref 22–32)
Calcium: 8.2 mg/dL — ABNORMAL LOW (ref 8.9–10.3)
Chloride: 113 mmol/L — ABNORMAL HIGH (ref 98–111)
Creatinine: 2.25 mg/dL — ABNORMAL HIGH (ref 0.61–1.24)
GFR, Estimated: 30 mL/min — ABNORMAL LOW (ref >60)
Glucose, Bld: 107 mg/dL — ABNORMAL HIGH (ref 70–99)
Potassium: 4.5 mmol/L (ref 3.5–5.1)
Sodium: 137 mmol/L (ref 135–145)
Total Bilirubin: 0.4 mg/dL (ref 0.3–1.2)
Total Protein: 6.4 g/dL — ABNORMAL LOW (ref 6.5–8.1)

## 2022-10-15 LAB — MAGNESIUM: Magnesium: 1.6 mg/dL — ABNORMAL LOW (ref 1.7–2.4)

## 2022-10-15 MED ORDER — HEPARIN SOD (PORK) LOCK FLUSH 100 UNIT/ML IV SOLN: 500.0000 [IU] | Freq: Once | INTRAVENOUS | Status: DC | PRN

## 2022-10-15 MED ORDER — SODIUM CHLORIDE 0.9 % IV SOLN
6.0000 mg/kg | Freq: Once | INTRAVENOUS | Status: AC
  Administered 2022-10-15: 600 mg via INTRAVENOUS
  Filled 2022-10-15: qty 20

## 2022-10-15 MED ORDER — SODIUM CHLORIDE 0.9% FLUSH: 10.0000 mL | INTRAVENOUS | Status: DC | PRN

## 2022-10-15 MED ORDER — SODIUM CHLORIDE 0.9 % IV SOLN: Freq: Once | INTRAVENOUS | Status: AC

## 2022-10-15 NOTE — Progress Notes (Signed)
Made [facility] dermatology referral coordinator of new referral order placed today to evaluate new lesions on face and leg.

## 2022-10-15 NOTE — Patient Instructions (Signed)
[facility]  Discharge Instructions: Thank you for choosing [facility] to provide your oncology and hematology care.   If you have a lab appointment with the Cancer Center, please go directly to the Cancer Center and check in at the registration area.   Wear comfortable clothing and clothing appropriate for easy access to any Portacath or PICC line.   We strive to give you quality time with your provider. You may need to reschedule your appointment if you arrive late (15 or more minutes).  Arriving late affects you and other patients whose appointments are after yours.  Also, if you miss three or more appointments without notifying the office, you may be dismissed from the clinic at the provider's discretion.      For prescription refill requests, have your pharmacy contact our office and allow 72 hours for refills to be completed.    Today you received the following chemotherapy and/or immunotherapy agents Panitumumab.      To help prevent nausea and vomiting after your treatment, we encourage you to take your nausea medication as directed.  BELOW ARE SYMPTOMS THAT SHOULD BE REPORTED IMMEDIATELY: *FEVER GREATER THAN 100.4 F (38 C) OR HIGHER *CHILLS OR SWEATING *NAUSEA AND VOMITING THAT IS NOT CONTROLLED WITH YOUR NAUSEA MEDICATION *UNUSUAL SHORTNESS OF BREATH *UNUSUAL BRUISING OR BLEEDING *URINARY PROBLEMS (pain or burning when urinating, or frequent urination) *BOWEL PROBLEMS (unusual diarrhea, constipation, pain near the anus) TENDERNESS IN MOUTH AND THROAT WITH OR WITHOUT PRESENCE OF ULCERS (sore throat, sores in mouth, or a toothache) UNUSUAL RASH, SWELLING OR PAIN  UNUSUAL VAGINAL DISCHARGE OR ITCHING   Items with * indicate a potential emergency and should be followed up as soon as possible or go to the Emergency Department if any problems should occur.  Please show the CHEMOTHERAPY ALERT CARD or IMMUNOTHERAPY ALERT CARD at  check-in to the Emergency Department and triage nurse.  Should you have questions after your visit or need to cancel or reschedule your appointment, please contact [facility]  Dept: [phone]  and follow the prompts.  Office hours are 8:00 a.m. to 4:30 p.m. Monday - Friday. Please note that voicemails left after 4:00 p.m. may not be returned until the following business day.  We are closed weekends and major holidays. You have access to a nurse at all times for urgent questions. Please call the main number to the clinic Dept: [phone] and follow the prompts.   For any non-urgent questions, you may also contact your provider using MyChart. We now offer e-Visits for anyone 65 and older to request care online for non-urgent symptoms. For details visit [email or web address].   Also download the MyChart app! Go to the app store, search "MyChart", open the app, select , and log in with your MyChart username and password.  Panitumumab Injection What is this medication? PANITUMUMAB (pan i TOOM ue mab) treats colorectal cancer. It works by blocking a protein that causes cancer cells to grow and multiply. This helps to slow or stop the spread of cancer cells. It is a monoclonal antibody. This medicine may be used for other purposes; ask your health care provider or pharmacist if you have questions. COMMON BRAND NAME(S): Vectibix What should I tell my care team before I take this medication? They need to know if you have any of these conditions: Eye disease Low levels of magnesium in the blood Lung disease An unusual or allergic reaction to  panitumumab, other medications, foods, dyes, or preservatives Pregnant or trying to get pregnant Breast-feeding How should I use this medication? This medication is injected into a vein. It is given by your care team in a hospital or clinic setting. Talk to your care team about the use of this medication in  children. Special care may be needed. Overdosage: If you think you have taken too much of this medicine contact a poison control center or emergency room at once. NOTE: This medicine is only for you. Do not share this medicine with others. What if I miss a dose? Keep appointments for follow-up doses. It is important not to miss your dose. Call your care team if you are unable to keep an appointment. What may interact with this medication? Bevacizumab This list may not describe all possible interactions. Give your health care provider a list of all the medicines, herbs, non-prescription drugs, or dietary supplements you use. Also tell them if you smoke, drink alcohol, or use illegal drugs. Some items may interact with your medicine. What should I watch for while using this medication? Your condition will be monitored carefully while you are receiving this medication. This medication may make you feel generally unwell. This is not uncommon as chemotherapy can affect healthy cells as well as cancer cells. Report any side effects. Continue your course of treatment even though you feel ill unless your care team tells you to stop. This medication can make you more sensitive to the sun. Keep out of the sun while receiving this medication and for 2 months after stopping therapy. If you cannot avoid being in the sun, wear protective clothing and sunscreen. Do not use sun lamps, tanning beds, or tanning booths. Check with your care team if you have severe diarrhea, nausea, and vomiting or if you sweat a lot. The loss of too much body fluid may make it dangerous for you to take this medication. This medication may cause serious skin reactions. They can happen weeks to months after starting the medication. Contact your care team right away if you notice fevers or flu-like symptoms with a rash. The rash may be red or purple and then turn into blisters or peeling of the skin. You may also notice a red rash with  swelling of the face, lips, or lymph nodes in your neck or under your arms. Talk to your care team if you may be pregnant. Serious birth defects can occur if you take this medication during pregnancy and for 2 months after the last dose. Contraception is recommended while taking this medication and for 2 months after the last dose. Your care team can help you find the option that works for you. Do not breastfeed while taking this medication and for 2 months after the last dose. This medication may cause infertility. Talk to your care team if you are concerned about your fertility. What side effects may I notice from receiving this medication? Side effects that you should report to your care team as soon as possible: Allergic reactions--skin rash, itching, hives, swelling of the face, lips, tongue, or throat Dry cough, shortness of breath or trouble breathing Eye pain, redness, irritation, or discharge with blurry or decreased vision Infusion reactions--chest pain, shortness of breath or trouble breathing, feeling faint or lightheaded Low magnesium level--muscle pain or cramps, unusual weakness or fatigue, fast or irregular heartbeat, tremors Low potassium level--muscle pain or cramps, unusual weakness or fatigue, fast or irregular heartbeat, constipation Redness, blistering, peeling, or loosening of the  skin, including inside the mouth Skin reactions on sun-exposed areas Side effects that usually do not require medical attention (report to your care team if they continue or are bothersome): Change in nail shape, thickness, or color Diarrhea Dry skin Fatigue Nausea Vomiting This list may not describe all possible side effects. Call your doctor for medical advice about side effects. You may report side effects to FDA at 1-800-FDA-1088. Where should I keep my medication? This medication is given in a hospital or clinic. It will not be stored at home. NOTE: This sheet is a summary. It may not  cover all possible information. If you have questions about this medicine, talk to your doctor, pharmacist, or health care provider.  2024 Elsevier/Gold Standard (2021-09-04 00:00:00)

## 2022-10-15 NOTE — Progress Notes (Signed)
Order received from Imogene, NP: "Looks like Magnesium is mildly decreased. Please have him resume oral magnesium. We can give him 2g of IV magnesium if he will agree".   Patient declined IVPB magnesium today and agreed to resume his oral magnesium when he gets home. He knows that magnesium will be rechecked at his next visit and based on result, he may need to get IV dose while in clinic, he agrees to this.

## 2022-10-15 NOTE — Progress Notes (Signed)
[facility] OFFICE PROGRESS NOTE   Diagnosis: Colon cancer  INTERVAL HISTORY:   Louis [name] returns as scheduled.  He continues encorafenib.  He was last treated with Panitumumab 09/24/2022.  He underwent an ablation procedure for atrial flutter 10/10/2022.  He feels significantly better since the ablation procedure.  Shortness of breath has resolved.  No chest pain.  No fever or cough.  He denies nausea/vomiting.  No diarrhea.  Mild increase in skin rash.  He denies pain.  He has noticed 2 skin lesions, 1 at the right medial eyebrow and 1 at the right lower anterior leg, over the past few months.  Objective:  Vital signs in last 24 hours:  Blood pressure (!) 155/79, pulse 63, temperature 97.7 F (36.5 C), resp. rate 18, weight 209 lb (94.8 kg), SpO2 100 %.    HEENT: No thrush or ulcers. Resp: Lungs clear bilaterally. Cardio: Regular rate and rhythm. GI: Abdomen soft and nontender.  No hepatosplenomegaly. Vascular: No leg edema. Skin: Round, raised lesion measuring approximately 2 mm at the medial right eyebrow.  Flat erythematous lesion at the right lower anterior leg. Port-A-Cath without erythema.  Lab Results:  Lab Results  Component Value Date   WBC 6.8 10/15/2022   HGB 9.7 (L) 10/15/2022   HCT 30.8 (L) 10/15/2022   MCV 85.1 10/15/2022   PLT 177 10/15/2022   NEUTROABS 4.4 10/15/2022    Imaging:  No results found.  Medications: I have reviewed the patient's current medications.  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), isolated mesenteric implant-resected, multiple tumor deposits Sigmoid/descending resection and creation of a descending colostomy 04/10/2016 MSI-stable, no loss of mismatch repair protein expression Foundation 1- BRAF V600E positive, MS-stable, intermediate tumor mutation burden, no RAS mutation CT abdomen/pelvis 04/02/2016-no evidence of distant metastatic disease Cycle 1 FOLFOX 05/21/2016 Cycle 2 FOLFOX  06/04/2016 Cycle 3 FOLFOX 06/18/2016 Cycle 4 FOLFOX 07/02/2016 (oxaliplatin held secondary to thrombocytopenia) Cycle 5 FOLFOX 07/16/2016 Cycle 6 FOLFOX 07/30/2016 Cycle 7 FOLFOX 08/13/2016 (oxaliplatin held and 5-FU dose reduced) Cycle 8 FOLFOX 09/03/2016 (oxaliplatin held secondary to neuropathy) Cycle 9 FOLFOX 09/17/2016 (oxaliplatin held secondary to neuropathy) Cycle 10 FOLFOX 10/02/2016 Cycle 11 FOLFOX 10/15/2016 (oxaliplatin eliminated from the regimen) Cycle 12 FOLFOX 10/30/2016 (oxaliplatin eliminated) CTs 05/12/2017-no evidence of recurrent disease, right inguinal hernia containing bladder Colonoscopy 07/21/2017, 3 polyps were removed from the descending and transverse colon, fragments of tubular and tubulovillous adenoma CTs 05/19/2018-no evidence of recurrent disease, status post hernia repair, right upper lobe pneumonia CTs 05/20/2019-resolution of right upper lobe pneumonia, no evidence of metastatic disease Colonoscopy 01/25/2020-multiple polyps removed-tubular adenomas, poor preparation, repeat colonoscopy recommended CTs neck, chest, and abdomen/pelvis 08/27/2020- new 2 centimeter left axillary node, new 1.9 cm left inguinal node chronic mildly prominent portacaval node Ultrasound biopsy of left inguinal node on 10/29/2020-metastatic adenocarcinoma consistent with colon adenocarcinoma PET 11/12/2020-enlarged hypermetabolic left inguinal and left axillary nodes, solitary focus of hypermetabolic activity in the right lower quadrant small bowel Cycle 1 FOLFOX 12/19/2020 Cycle 2 FOLFOX 01/02/2021, oxaliplatin dose reduced secondary to neutropenia and thrombocytopenia Cycle 3 FOLFOX 01/16/2021 Cycle 4 FOLFOX 01/30/2021 CTs 02/11/2021-no change in left inguinal and left axillary nodes, no evidence of disease progression, stable wall thickening involving a loop of distal ileum Cycle 1 FOLFIRI/Avastin 02/27/2021 Cycle 2 FOLFIRI/Avastin 03/13/2021 Cycle 3 FOLFIRI/Avastin 04/03/2021 Cycle 4  FOLFIRI 04/17/2021, Avastin held due to elevated urine protein 05/07/2021-24-hour urine protein elevated 1562 Cycle 5 FOLFIRI 05/08/2021, Avastin held due to elevated urine protein, irinotecan dose reduced due  to in general not feeling well after treatment CTs 05/17/2021-left axillary and left inguinal lymph nodes are smaller, no evidence of disease progression Cycle 6 FOLFIRI 05/22/2021, Avastin held due to proteinuria 06/03/2021 24-hour urine with 1.9 g of protein Cycle 7 FOLFIRI 06/12/2021, Avastin held due to proteinuria Cycle 8 FOLFIRI 07/03/2021, Avastin held due to proteinuria Cycle 9 FOLFIRI 07/24/2021, Avastin held due to proteinuria Cycle 10 FOLFIRI 08/19/2021, Avastin held due to proteinuria CTs 09/02/2021-no change in the left axillary, left inguinal, and portacaval lymph nodes.  No mention of the distal small bowel thickening Maintenance 5-fluorouracil 09/18/2021 CT abdomen/pelvis 10/28/2021-enlarged left inguinal lymph node, stable; stomach and small bowel grossly unremarkable. Maintenance 5-fluorouracil continued Colonoscopy 12/10/2021-terminal ileum with an ulcerated partially obstructing medium-sized mass approximately 15 to 20 cm from the IC valve, invasive adenocarcinoma, colonic type, moderately differentiated (grade 2) PET scan 12/18/2021-progression of disease in the peritoneal space.  2 intensely hypermetabolic nodes in the left axilla, one node is new from the prior, the other is reduced in size and metabolic activity; decrease in size and metabolic activity of left inguinal adenopathy; intense metabolic activity associate with a loop of small bowel in the right lower quadrant, no interval change; new small hypermetabolic peritoneal implant along the ventral peritoneal surface just right of midline; larger new peritoneal implant in the deep right pelvis; small new implant in the right mid abdomen Encorafenib/Panitumumab 01/08/2022 CTs 03/17/2022-decrease size of mesenteric implant at the posterior  bladder wall, small mesenteric peritoneal lesions identified on PET/CT or not evident.  Decrease size of left inguinal left axillary nodes, hypermetabolic activity noted at the anterior left prostate on comparison August 2023 PET CTs 07/21/2022-decrease size of a left axillary lymph node and right pelvic implant.  Left inguinal lymph node measures smaller.  No new sites of disease identified.  Similar tiny pulmonary nodules. Encorafenib and Panitumumab continued, Panitumumab changed from a 2-week schedule to a 3-week schedule 07/23/2022   2.   Chronic renal insufficiency   3.   Hypertension   4.  Inflamed sebaceous cyst at the upper back-status post incision and drainage   5.  Thrombocytopenia secondary chemotherapy-oxaliplatin dose reduced beginning with cycle 3 FOLFOX, oxaliplatin held with cycle 4 and cycle 7 FOLFOX   6.  Oxaliplatin neuropathy-improved   7.  History of Mucositis secondary chemotherapy   8.  Symptoms of pneumonia January 2020-CT 05/19/2018 consistent with right upper lobe pneumonia, Levaquin prescribed 05/20/2018   9.  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  Anemia, progressive 10/29/2021 2 units packed red blood cells 10/31/2021 Ferritin low 10/29/2021-oral iron Ferritin low 01/22/2022   12.  Colonoscopy 12/10/2021-terminal ileum with an ulcerated partially obstructing medium-sized mass approximately 15 to 20 cm from the IC valve, invasive adenocarcinoma, colonic type, moderately differentiated (grade 2) 13.  Admission 09/02/2022 with atrial flutter and rapid ventricular response  Disposition: Mr. Fries appears stable.  He is on active treatment with encorafenib and Panitumumab.  He is tolerating treatment well.  There is no clinical evidence of disease progression.  CEA is stable in normal range.  Plan to proceed with Panitumumab today as scheduled.  Restaging CTs prior to next office visit.  CBC and chemistry panel  reviewed.  Labs adequate to proceed as above.  Magnesium is mildly decreased.  He will resume oral magnesium.  He will return for follow-up in 3 weeks.  We are available to see him sooner if needed.  Lonna Cobb ANP/GNP-BC   10/15/2022  12:37 PM

## 2022-10-22 ENCOUNTER — Other Ambulatory Visit (HOSPITAL_BASED_OUTPATIENT_CLINIC_OR_DEPARTMENT_OTHER): Payer: Self-pay

## 2022-10-22 ENCOUNTER — Other Ambulatory Visit: Payer: Self-pay

## 2022-10-22 ENCOUNTER — Telehealth: Payer: Self-pay | Admitting: Cardiology

## 2022-10-22 ENCOUNTER — Encounter: Payer: Self-pay | Admitting: Oncology

## 2022-10-22 ENCOUNTER — Emergency Department (HOSPITAL_BASED_OUTPATIENT_CLINIC_OR_DEPARTMENT_OTHER)
Admission: EM | Admit: 2022-10-22 | Discharge: 2022-10-22 | Disposition: A | Discharge status: Home / Self Care | Payer: Medicare Other | Attending: Emergency Medicine | Admitting: Emergency Medicine

## 2022-10-22 ENCOUNTER — Emergency Department (HOSPITAL_BASED_OUTPATIENT_CLINIC_OR_DEPARTMENT_OTHER): Payer: Medicare Other | Admitting: Radiology

## 2022-10-22 ENCOUNTER — Encounter (HOSPITAL_BASED_OUTPATIENT_CLINIC_OR_DEPARTMENT_OTHER): Payer: Self-pay | Admitting: Emergency Medicine

## 2022-10-22 DIAGNOSIS — Z7901 Long term (current) use of anticoagulants: Secondary | ICD-10-CM | POA: Insufficient documentation

## 2022-10-22 DIAGNOSIS — R0602 Shortness of breath: Secondary | ICD-10-CM | POA: Diagnosis not present

## 2022-10-22 DIAGNOSIS — I4892 Unspecified atrial flutter: Secondary | ICD-10-CM | POA: Insufficient documentation

## 2022-10-22 LAB — CBC WITH DIFFERENTIAL/PLATELET
Abs Immature Granulocytes: 0.02 10*3/uL (ref 0.00–0.07)
Basophils Absolute: 0.1 10*3/uL (ref 0.0–0.1)
Basophils Relative: 1 %
Eosinophils Absolute: 0.2 10*3/uL (ref 0.0–0.5)
Eosinophils Relative: 3 %
HCT: 35.5 % — ABNORMAL LOW (ref 39.0–52.0)
Hemoglobin: 11 g/dL — ABNORMAL LOW (ref 13.0–17.0)
Immature Granulocytes: 0 %
Lymphocytes Relative: 19 %
Lymphs Abs: 1.1 10*3/uL (ref 0.7–4.0)
MCH: 26.6 pg (ref 26.0–34.0)
MCHC: 31 g/dL (ref 30.0–36.0)
MCV: 86 fL (ref 80.0–100.0)
Monocytes Absolute: 0.7 10*3/uL (ref 0.1–1.0)
Monocytes Relative: 12 %
Neutro Abs: 3.7 10*3/uL (ref 1.7–7.7)
Neutrophils Relative %: 65 %
Platelets: 205 10*3/uL (ref 150–400)
RBC: 4.13 MIL/uL — ABNORMAL LOW (ref 4.22–5.81)
RDW: 17.3 % — ABNORMAL HIGH (ref 11.5–15.5)
WBC: 5.7 10*3/uL (ref 4.0–10.5)
nRBC: 0 % (ref 0.0–0.2)

## 2022-10-22 LAB — BASIC METABOLIC PANEL
Anion gap: 8 (ref 5–15)
BUN: 40 mg/dL — ABNORMAL HIGH (ref 8–23)
CO2: 18 mmol/L — ABNORMAL LOW (ref 22–32)
Calcium: 8.8 mg/dL — ABNORMAL LOW (ref 8.9–10.3)
Chloride: 112 mmol/L — ABNORMAL HIGH (ref 98–111)
Creatinine, Ser: 2.51 mg/dL — ABNORMAL HIGH (ref 0.61–1.24)
GFR, Estimated: 26 mL/min — ABNORMAL LOW (ref >60)
Glucose, Bld: 100 mg/dL — ABNORMAL HIGH (ref 70–99)
Potassium: 4.8 mmol/L (ref 3.5–5.1)
Sodium: 138 mmol/L (ref 135–145)

## 2022-10-22 LAB — TROPONIN I (HIGH SENSITIVITY)
Troponin I (High Sensitivity): 9 ng/L (ref <18)
Troponin I (High Sensitivity): 9 ng/L (ref <18)

## 2022-10-22 LAB — HEPATIC FUNCTION PANEL
ALT: <5 U/L (ref 0–44)
AST: 10 U/L — ABNORMAL LOW (ref 15–41)
Albumin: 3.4 g/dL — ABNORMAL LOW (ref 3.5–5.0)
Alkaline Phosphatase: 102 U/L (ref 38–126)
Bilirubin, Direct: 0.1 mg/dL (ref 0.0–0.2)
Indirect Bilirubin: 0.2 mg/dL — ABNORMAL LOW (ref 0.3–0.9)
Total Bilirubin: 0.3 mg/dL (ref 0.3–1.2)
Total Protein: 6.8 g/dL (ref 6.5–8.1)

## 2022-10-22 LAB — MAGNESIUM: Magnesium: 1.8 mg/dL (ref 1.7–2.4)

## 2022-10-22 LAB — BRAIN NATRIURETIC PEPTIDE: B Natriuretic Peptide: 873 pg/mL — ABNORMAL HIGH (ref 0.0–100.0)

## 2022-10-22 NOTE — ED Notes (Signed)
Pt. returned from XR. 

## 2022-10-22 NOTE — Telephone Encounter (Signed)
Pt spouse called in stating pt is back in afib and having some SOB. He is in the ED now, but she wants to know is this normal since he just had an ablation. Please advise.

## 2022-10-22 NOTE — ED Triage Notes (Signed)
Pt arrives to ED with c/o intermittent SOB x1 week that worsened today.

## 2022-10-22 NOTE — ED Provider Notes (Signed)
[facility] AT [facility] Provider Note   CSN: [id number] Arrival date & time: 10/22/22  1033     History  Chief Complaint  Patient presents with   Shortness of Breath    Louis [name] is a 77 y.o. male.  Patient is a 77 year old male with a past medical history of atrial flutter status post ablation few weeks ago on Eliquis, colon cancer on chemotherapy presenting to the emergency department with shortness of breath and fatigue.  The patient states that he has had some mild shortness of breath for the last couple of days however this morning he states that he is having difficulty showering and getting ready for the day without having to sit down and rest due to his shortness of breath and fatigue.  He denies any associated chest pain, fever or cough.  He denies any lower extremity swelling.  He states that he has been compliant with his Eliquis.  He states that he has had an occasional palpitations.  The history is provided by the patient and the spouse.  Shortness of Breath      Home Medications Prior to Admission medications   Medication Sig Start Date End Date Taking? Authorizing Provider  acetaminophen (TYLENOL) 650 MG CR tablet Take 650 mg by mouth in the morning.    [provider]  amLODipine (NORVASC) 10 MG tablet Take 5 mg by mouth daily.    [provider]  apixaban (ELIQUIS) 5 MG TABS tablet Take 1 tablet (5 mg total) by mouth 2 (two) times daily. 09/22/22   Eustace Pen, PA-C  ciprofloxacin (CIPRO) 500 MG tablet Take 500 mg by mouth 2 (two) times daily. 09/30/22   [provider]  doxycycline (VIBRA-TABS) 100 MG tablet Take 100 mg by mouth 2 (two) times daily. 09/08/22   [provider]  encorafenib (BRAFTOVI) 75 MG capsule Take 4 capsules (300 mg total) by mouth daily. 10/08/22   Ladene Artist, MD  ferrous sulfate 325 (65 FE) MG EC tablet Take 1 tablet (325 mg total) by mouth 2 (two) times daily with  a meal. Patient not taking: Reported on 10/06/2022 07/23/22   Rana Snare, NP  lidocaine-prilocaine (EMLA) cream Apply 1 Application topically as needed. Apply 1/2 tablespoon to port site 2 hours prior to stick and cover with plastic wrap to numb site. DO NOT USE UNTIL 14 DAYS AFTER PORT IS PLACED. 03/19/22   Ladene Artist, MD  loratadine (CLARITIN) 10 MG tablet Take 10 mg by mouth daily as needed for allergies.     [provider]  Magnesium Oxide 400 MG CAPS Take 1 capsule (400 mg total) by mouth 2 (two) times daily. Patient not taking: Reported on 10/06/2022 07/23/22   Rana Snare, NP  metoprolol tartrate (LOPRESSOR) 25 MG tablet Take 2 tablets (50 mg total) by mouth 2 (two) times daily. 09/22/22   Eustace Pen, PA-C  ondansetron (ZOFRAN) 8 MG tablet Take 1 tablet (8 mg total) by mouth every 8 (eight) hours as needed for nausea or vomiting. Patient not taking: Reported on 10/06/2022 12/07/20   Ladene Artist, MD  prochlorperazine (COMPAZINE) 10 MG tablet Take 1 tablet (10 mg total) by mouth every 6 (six) hours as needed for nausea or vomiting. 09/24/22   Rana Snare, NP  tamsulosin (FLOMAX) 0.4 MG CAPS capsule Take 0.4 mg by mouth 2 (two) times daily. 09/25/22   [provider]      Allergies  Patient has no known allergies.    Review of Systems   Review of Systems  Respiratory:  Positive for shortness of breath.     Physical Exam Updated Vital Signs BP 126/77   Pulse 83   Temp 97.7 F (36.5 C) (Oral)   Resp 17   SpO2 99%  Physical Exam Vitals and nursing note reviewed.  Constitutional:      General: He is not in acute distress.    Appearance: He is well-developed.  HENT:     Head: Normocephalic and atraumatic.     Mouth/Throat:     Mouth: Mucous membranes are moist.     Pharynx: Oropharynx is clear.  Eyes:     Extraocular Movements: Extraocular movements intact.  Cardiovascular:     Rate and Rhythm: Normal rate and regular rhythm.     Pulses:  Normal pulses.     Heart sounds: Normal heart sounds.  Pulmonary:     Effort: Pulmonary effort is normal.     Breath sounds: Normal breath sounds.  Abdominal:     Palpations: Abdomen is soft.     Tenderness: There is no abdominal tenderness.  Musculoskeletal:        General: Normal range of motion.     Cervical back: Normal range of motion and neck supple.     Right lower leg: No edema.     Left lower leg: No edema.  Skin:    General: Skin is warm and dry.  Neurological:     General: No focal deficit present.     Mental Status: He is alert and oriented to person, place, and time.  Psychiatric:        Mood and Affect: Mood normal.        Behavior: Behavior normal.     ED Results / Procedures / Treatments   Labs (all labs ordered are listed, but only abnormal results are displayed) Labs Reviewed  CBC WITH DIFFERENTIAL/PLATELET - Abnormal; Notable for the following components:      Result Value   RBC 4.13 (*)    Hemoglobin 11.0 (*)    HCT 35.5 (*)    RDW 17.3 (*)    All other components within normal limits  BASIC METABOLIC PANEL - Abnormal; Notable for the following components:   Chloride 112 (*)    CO2 18 (*)    Glucose, Bld 100 (*)    BUN 40 (*)    Creatinine, Ser 2.51 (*)    Calcium 8.8 (*)    GFR, Estimated 26 (*)    All other components within normal limits  BRAIN NATRIURETIC PEPTIDE - Abnormal; Notable for the following components:   B Natriuretic Peptide 873.0 (*)    All other components within normal limits  HEPATIC FUNCTION PANEL - Abnormal; Notable for the following components:   Albumin 3.4 (*)    AST 10 (*)    Indirect Bilirubin 0.2 (*)    All other components within normal limits  MAGNESIUM  TROPONIN I (HIGH SENSITIVITY)  TROPONIN I (HIGH SENSITIVITY)    EKG EKG Interpretation  Date/Time:  Wednesday October 22 2022 11:33:14 EDT Ventricular Rate:  66 PR Interval:    QRS Duration: 170 QT Interval:  453 QTC Calculation: 475 R Axis:   -10 Text  Interpretation: Atrial fluter with variable block Left bundle branch block Confirmed by Elayne Snare ([id number]) on 10/22/2022 12:05:42 PM  Radiology DG Chest 2 View  Result Date: 10/22/2022 CLINICAL DATA:  Shortness of breath EXAM: CHEST -  2 VIEW COMPARISON:  X-ray 09/14/2022 FINDINGS: No consolidation, pneumothorax or effusion. No edema. Borderline cardiopericardial silhouette. Stable right IJ chest port with tip at the SVC right atrial junction. Overlapping cardiac leads. Degenerative changes of the spine IMPRESSION: No acute cardiopulmonary disease.  Chest port. Electronically Signed   By: Karen Kays M.D.   On: 10/22/2022 11:53    Procedures Procedures    Medications Ordered in ED Medications - No data to display  ED Course/ Medical Decision Making/ A&P Clinical Course as of 10/22/22 1434  Wed Oct 22, 2022  1214 Cr and mildly low bicarb at baseline for patient. Hgb improved form prior. BNP mildly elevated, no evidence of pulmonary edema on CXR and clinically does not appear volume overloaded. Initial troponin negative. Outpatient mag one week ago mildly low and will assess here as repeat EKG does appear to be in atrial flutter. [VK]    Clinical Course User Index [VK] Rexford Maus, DO                             Medical Decision Making This patient presents to the ED with chief complaint(s) of SOB, fatigue with pertinent past medical history of a flutter s/p ablation, colon cancer on chemo which further complicates the presenting complaint. The complaint involves an extensive differential diagnosis and also carries with it a high risk of complications and morbidity.    The differential diagnosis includes ACS, arrhythmia, anemia, pneumonia, pneumothorax, pulmonary edema, pleural effusion, considering PE though less likely as he is on anticoagulation in the setting of his active cancer, dehydration, electrolyte abnormality  Additional history obtained: Additional history  obtained from spouse Records reviewed outpatient cardiology and oncology records  ED Course and Reassessment: On patient's arrival to the emergency department he is hemodynamically stable in no acute distress.  EKG on arrival appeared to be in a regular appearing rhythm with P waves, seems to be a sinus rhythm but unclear and will have repeat EKG to try to attempt to have a better baseline.  Patient will have labs including troponin and BNP, CXR.   Independent labs interpretation:  The following labs were independently interpreted: mildly elevated bnp from baseline, otherwise within normal range  Independent visualization of imaging: - I independently visualized the following imaging with scope of interpretation limited to determining acute life threatening conditions related to emergency care: CXR, which revealed no acute disease  Consultation: - Consulted or discussed management/test interpretation w/ external professional: N/A  Consideration for admission or further workup: Patient has no emergent conditions requiring admission or further work-up at this time and is stable for discharge home with cardiology follow-up  Social Determinants of health: N/A    Amount and/or Complexity of Data Reviewed Labs: ordered. Radiology: ordered.          Final Clinical Impression(s) / ED Diagnoses Final diagnoses:  Atrial flutter, unspecified type [facility])    Rx / DC Orders ED Discharge Orders     None         Rexford Maus, DO 10/22/22 1434

## 2022-10-22 NOTE — Discharge Instructions (Signed)
You were seen in the emergency department for shortness of breath and fatigue.  Your EKG did show that you are back in atrial flutter though you are at a normal rate.  Your workup showed no signs of stress on your heart or abnormal electrolytes that may have induced the atrial flutter and it is unclear what caused you to go back at this time.  You can follow-up with your electrophysiologist for further management to see if you need any medication changes or seizures in the future.  You should return to the emergency department if you have chest pain, worsening shortness of breath, your heart is racing and does not go back to normal on its own or if you have any other new or concerning symptoms.

## 2022-10-22 NOTE — ED Notes (Signed)
Discharge instructions and follow up care reviewed and explained, pt verbalized understanding and had no further questions on d/c. Pt caox4 and NAD on d/c.

## 2022-10-22 NOTE — Telephone Encounter (Signed)
Late Entry: Spoke to wife and pt around 12:30 today. Aware he may experience some break through atrial flutter up to one month post ablation.  I will ensure [place] is made aware of current ED visit. Wife and pt appreciate the return call.

## 2022-10-23 NOTE — Telephone Encounter (Signed)
Pt aware of MD recommendation. Pt states the ED MD told him it was just flutter. Aware I will discuss again  w/ Dr. Elberta Fortis tomorrow before determining if needs OV next week. Patient verbalized understanding and agreeable to plan.

## 2022-10-24 NOTE — Telephone Encounter (Signed)
Pt informed that afib was seen on EKGs. Being that pt is feeling much improved currently there is no need for sooner OV than his scheduled appt in July.  Pt taking & compliant w/ Eliquis. Advised to call back if becomes symptomatic again. Patient verbalized understanding and agreeable to plan.

## 2022-10-28 ENCOUNTER — Ambulatory Visit: Payer: Medicare Other | Admitting: Cardiology

## 2022-10-29 ENCOUNTER — Telehealth: Payer: Self-pay

## 2022-10-29 NOTE — Telephone Encounter (Signed)
Transition Care Management Follow-up Telephone Call Date of discharge and from where: [facility] 6/19 How have you been since you were released from the hospital? Doing ok Any questions or concerns? No  Items Reviewed: Did the pt receive and understand the discharge instructions provided? Yes  Medications obtained and verified? Yes  Other? No  Any new allergies since your discharge? No  Dietary orders reviewed? No Do you have support at home? Yes     Follow up appointments reviewed:  PCP Hospital f/u appt confirmed? Yes  Scheduled to see  on  @ . Specialist Hospital f/u appt confirmed?  Scheduled to see  on  @ . Are transportation arrangements needed? No  If their condition worsens, is the pt aware to call PCP or go to the Emergency Dept.?  Was the patient provided with contact information for the PCP's office or ED? Yes Was to pt encouraged to call back with questions or concerns? Yes

## 2022-10-30 ENCOUNTER — Ambulatory Visit (HOSPITAL_BASED_OUTPATIENT_CLINIC_OR_DEPARTMENT_OTHER)
Admission: RE | Admit: 2022-10-30 | Discharge: 2022-10-30 | Disposition: A | Discharge status: Home / Self Care | Payer: Medicare Other | Source: Ambulatory Visit | Attending: Nurse Practitioner | Admitting: Nurse Practitioner

## 2022-10-30 DIAGNOSIS — N3289 Other specified disorders of bladder: Secondary | ICD-10-CM | POA: Diagnosis not present

## 2022-10-30 DIAGNOSIS — C19 Malignant neoplasm of rectosigmoid junction: Secondary | ICD-10-CM | POA: Diagnosis not present

## 2022-10-30 DIAGNOSIS — C189 Malignant neoplasm of colon, unspecified: Secondary | ICD-10-CM | POA: Insufficient documentation

## 2022-10-30 DIAGNOSIS — R590 Localized enlarged lymph nodes: Secondary | ICD-10-CM | POA: Diagnosis not present

## 2022-11-05 ENCOUNTER — Inpatient Hospital Stay: Payer: Medicare Other | Attending: Oncology

## 2022-11-05 ENCOUNTER — Inpatient Hospital Stay: Payer: Medicare Other

## 2022-11-05 ENCOUNTER — Inpatient Hospital Stay (HOSPITAL_BASED_OUTPATIENT_CLINIC_OR_DEPARTMENT_OTHER): Payer: Medicare Other | Admitting: Nurse Practitioner

## 2022-11-05 VITALS — BP 136/72 | HR 57

## 2022-11-05 VITALS — BP 144/62 | HR 97 | Temp 97.9°F | Resp 18 | Ht 76.0 in | Wt 214.0 lb

## 2022-11-05 DIAGNOSIS — C189 Malignant neoplasm of colon, unspecified: Secondary | ICD-10-CM

## 2022-11-05 DIAGNOSIS — C187 Malignant neoplasm of sigmoid colon: Secondary | ICD-10-CM | POA: Diagnosis not present

## 2022-11-05 DIAGNOSIS — G620 Drug-induced polyneuropathy: Secondary | ICD-10-CM | POA: Insufficient documentation

## 2022-11-05 DIAGNOSIS — Z5112 Encounter for antineoplastic immunotherapy: Secondary | ICD-10-CM | POA: Diagnosis not present

## 2022-11-05 DIAGNOSIS — N189 Chronic kidney disease, unspecified: Secondary | ICD-10-CM | POA: Diagnosis not present

## 2022-11-05 DIAGNOSIS — R21 Rash and other nonspecific skin eruption: Secondary | ICD-10-CM | POA: Insufficient documentation

## 2022-11-05 DIAGNOSIS — I129 Hypertensive chronic kidney disease with stage 1 through stage 4 chronic kidney disease, or unspecified chronic kidney disease: Secondary | ICD-10-CM | POA: Diagnosis not present

## 2022-11-05 DIAGNOSIS — D6959 Other secondary thrombocytopenia: Secondary | ICD-10-CM | POA: Insufficient documentation

## 2022-11-05 DIAGNOSIS — D649 Anemia, unspecified: Secondary | ICD-10-CM | POA: Insufficient documentation

## 2022-11-05 DIAGNOSIS — Z452 Encounter for adjustment and management of vascular access device: Secondary | ICD-10-CM | POA: Diagnosis not present

## 2022-11-05 LAB — CMP (CANCER CENTER ONLY)
ALT: <5 U/L (ref 0–44)
AST: 8 U/L — ABNORMAL LOW (ref 15–41)
Albumin: 3.4 g/dL — ABNORMAL LOW (ref 3.5–5.0)
Alkaline Phosphatase: 94 U/L (ref 38–126)
Anion gap: 6 (ref 5–15)
BUN: 26 mg/dL — ABNORMAL HIGH (ref 8–23)
CO2: 21 mmol/L — ABNORMAL LOW (ref 22–32)
Calcium: 8.9 mg/dL (ref 8.9–10.3)
Chloride: 109 mmol/L (ref 98–111)
Creatinine: 2.12 mg/dL — ABNORMAL HIGH (ref 0.61–1.24)
GFR, Estimated: 32 mL/min — ABNORMAL LOW (ref >60)
Glucose, Bld: 135 mg/dL — ABNORMAL HIGH (ref 70–99)
Potassium: 4.5 mmol/L (ref 3.5–5.1)
Sodium: 136 mmol/L (ref 135–145)
Total Bilirubin: 0.4 mg/dL (ref 0.3–1.2)
Total Protein: 6.5 g/dL (ref 6.5–8.1)

## 2022-11-05 LAB — CBC WITH DIFFERENTIAL (CANCER CENTER ONLY)
Abs Immature Granulocytes: 0.01 10*3/uL (ref 0.00–0.07)
Basophils Absolute: 0 10*3/uL (ref 0.0–0.1)
Basophils Relative: 1 %
Eosinophils Absolute: 0.1 10*3/uL (ref 0.0–0.5)
Eosinophils Relative: 3 %
HCT: 30 % — ABNORMAL LOW (ref 39.0–52.0)
Hemoglobin: 9.4 g/dL — ABNORMAL LOW (ref 13.0–17.0)
Immature Granulocytes: 0 %
Lymphocytes Relative: 17 %
Lymphs Abs: 0.9 10*3/uL (ref 0.7–4.0)
MCH: 26.6 pg (ref 26.0–34.0)
MCHC: 31.3 g/dL (ref 30.0–36.0)
MCV: 84.7 fL (ref 80.0–100.0)
Monocytes Absolute: 0.6 10*3/uL (ref 0.1–1.0)
Monocytes Relative: 12 %
Neutro Abs: 3.6 10*3/uL (ref 1.7–7.7)
Neutrophils Relative %: 67 %
Platelet Count: 183 10*3/uL (ref 150–400)
RBC: 3.54 MIL/uL — ABNORMAL LOW (ref 4.22–5.81)
RDW: 16.6 % — ABNORMAL HIGH (ref 11.5–15.5)
WBC Count: 5.3 10*3/uL (ref 4.0–10.5)
nRBC: 0 % (ref 0.0–0.2)

## 2022-11-05 LAB — MAGNESIUM: Magnesium: 1.8 mg/dL (ref 1.7–2.4)

## 2022-11-05 MED ORDER — SODIUM CHLORIDE 0.9 % IV SOLN: Freq: Once | INTRAVENOUS | Status: AC

## 2022-11-05 MED ORDER — HEPARIN SOD (PORK) LOCK FLUSH 100 UNIT/ML IV SOLN
500.0000 [IU] | Freq: Once | INTRAVENOUS | Status: AC | PRN
  Administered 2022-11-05: 500 [IU]

## 2022-11-05 MED ORDER — SODIUM CHLORIDE 0.9 % IV SOLN
6.0000 mg/kg | Freq: Once | INTRAVENOUS | Status: AC
  Administered 2022-11-05: 600 mg via INTRAVENOUS
  Filled 2022-11-05: qty 20

## 2022-11-05 MED ORDER — SODIUM CHLORIDE 0.9% FLUSH
10.0000 mL | INTRAVENOUS | Status: DC | PRN
  Administered 2022-11-05: 10 mL

## 2022-11-05 NOTE — Progress Notes (Signed)
Patient seen by Lonna Cobb NP today  Vitals are within treatment parameters.  Labs reviewed by Lonna Cobb NP and are not all within treatment parameters. Creatinine 2.12, per Lonna Cobb, NP ok to treat.   Per physician team, patient is ready for treatment and there are NO modifications to the treatment plan. Per Lonna Cobb, NP no IV Magnesium needed today.

## 2022-11-05 NOTE — Patient Instructions (Signed)
[facility]  Discharge Instructions: Thank you for choosing [facility] to provide your oncology and hematology care.   If you have a lab appointment with the Cancer Center, please go directly to the Cancer Center and check in at the registration area.   Wear comfortable clothing and clothing appropriate for easy access to any Portacath or PICC line.   We strive to give you quality time with your provider. You may need to reschedule your appointment if you arrive late (15 or more minutes).  Arriving late affects you and other patients whose appointments are after yours.  Also, if you miss three or more appointments without notifying the office, you may be dismissed from the clinic at the provider's discretion.      For prescription refill requests, have your pharmacy contact our office and allow 72 hours for refills to be completed.    Today you received the following chemotherapy and/or immunotherapy agents Panitumumab.      To help prevent nausea and vomiting after your treatment, we encourage you to take your nausea medication as directed.  BELOW ARE SYMPTOMS THAT SHOULD BE REPORTED IMMEDIATELY: *FEVER GREATER THAN 100.4 F (38 C) OR HIGHER *CHILLS OR SWEATING *NAUSEA AND VOMITING THAT IS NOT CONTROLLED WITH YOUR NAUSEA MEDICATION *UNUSUAL SHORTNESS OF BREATH *UNUSUAL BRUISING OR BLEEDING *URINARY PROBLEMS (pain or burning when urinating, or frequent urination) *BOWEL PROBLEMS (unusual diarrhea, constipation, pain near the anus) TENDERNESS IN MOUTH AND THROAT WITH OR WITHOUT PRESENCE OF ULCERS (sore throat, sores in mouth, or a toothache) UNUSUAL RASH, SWELLING OR PAIN  UNUSUAL VAGINAL DISCHARGE OR ITCHING   Items with * indicate a potential emergency and should be followed up as soon as possible or go to the Emergency Department if any problems should occur.  Please show the CHEMOTHERAPY ALERT CARD or IMMUNOTHERAPY ALERT CARD at  check-in to the Emergency Department and triage nurse.  Should you have questions after your visit or need to cancel or reschedule your appointment, please contact [facility]  Dept: [phone]  and follow the prompts.  Office hours are 8:00 a.m. to 4:30 p.m. Monday - Friday. Please note that voicemails left after 4:00 p.m. may not be returned until the following business day.  We are closed weekends and major holidays. You have access to a nurse at all times for urgent questions. Please call the main number to the clinic Dept: [phone] and follow the prompts.   For any non-urgent questions, you may also contact your provider using MyChart. We now offer e-Visits for anyone 18 and older to request care online for non-urgent symptoms. For details visit [email or web address].   Also download the MyChart app! Go to the app store, search "MyChart", open the app, select [facility], and log in with your MyChart username and password.  Panitumumab Injection What is this medication? PANITUMUMAB (pan i TOOM ue mab) treats colorectal cancer. It works by blocking a protein that causes cancer cells to grow and multiply. This helps to slow or stop the spread of cancer cells. It is a monoclonal antibody. This medicine may be used for other purposes; ask your health care provider or pharmacist if you have questions. COMMON BRAND NAME(S): Vectibix What should I tell my care team before I take this medication? They need to know if you have any of these conditions: Eye disease Low levels of magnesium in the blood Lung disease An unusual or allergic reaction to   panitumumab, other medications, foods, dyes, or preservatives Pregnant or trying to get pregnant Breast-feeding How should I use this medication? This medication is injected into a vein. It is given by your care team in a hospital or clinic setting. Talk to your care team about the use of this medication in  children. Special care may be needed. Overdosage: If you think you have taken too much of this medicine contact a poison control center or emergency room at once. NOTE: This medicine is only for you. Do not share this medicine with others. What if I miss a dose? Keep appointments for follow-up doses. It is important not to miss your dose. Call your care team if you are unable to keep an appointment. What may interact with this medication? Bevacizumab This list may not describe all possible interactions. Give your health care provider a list of all the medicines, herbs, non-prescription drugs, or dietary supplements you use. Also tell them if you smoke, drink alcohol, or use illegal drugs. Some items may interact with your medicine. What should I watch for while using this medication? Your condition will be monitored carefully while you are receiving this medication. This medication may make you feel generally unwell. This is not uncommon as chemotherapy can affect healthy cells as well as cancer cells. Report any side effects. Continue your course of treatment even though you feel ill unless your care team tells you to stop. This medication can make you more sensitive to the sun. Keep out of the sun while receiving this medication and for 2 months after stopping therapy. If you cannot avoid being in the sun, wear protective clothing and sunscreen. Do not use sun lamps, tanning beds, or tanning booths. Check with your care team if you have severe diarrhea, nausea, and vomiting or if you sweat a lot. The loss of too much body fluid may make it dangerous for you to take this medication. This medication may cause serious skin reactions. They can happen weeks to months after starting the medication. Contact your care team right away if you notice fevers or flu-like symptoms with a rash. The rash may be red or purple and then turn into blisters or peeling of the skin. You may also notice a red rash with  swelling of the face, lips, or lymph nodes in your neck or under your arms. Talk to your care team if you may be pregnant. Serious birth defects can occur if you take this medication during pregnancy and for 2 months after the last dose. Contraception is recommended while taking this medication and for 2 months after the last dose. Your care team can help you find the option that works for you. Do not breastfeed while taking this medication and for 2 months after the last dose. This medication may cause infertility. Talk to your care team if you are concerned about your fertility. What side effects may I notice from receiving this medication? Side effects that you should report to your care team as soon as possible: Allergic reactions--skin rash, itching, hives, swelling of the face, lips, tongue, or throat Dry cough, shortness of breath or trouble breathing Eye pain, redness, irritation, or discharge with blurry or decreased vision Infusion reactions--chest pain, shortness of breath or trouble breathing, feeling faint or lightheaded Low magnesium level--muscle pain or cramps, unusual weakness or fatigue, fast or irregular heartbeat, tremors Low potassium level--muscle pain or cramps, unusual weakness or fatigue, fast or irregular heartbeat, constipation Redness, blistering, peeling, or loosening of the   skin, including inside the mouth Skin reactions on sun-exposed areas Side effects that usually do not require medical attention (report to your care team if they continue or are bothersome): Change in nail shape, thickness, or color Diarrhea Dry skin Fatigue Nausea Vomiting This list may not describe all possible side effects. Call your doctor for medical advice about side effects. You may report side effects to FDA at 1-800-FDA-1088. Where should I keep my medication? This medication is given in a hospital or clinic. It will not be stored at home. NOTE: This sheet is a summary. It may not  cover all possible information. If you have questions about this medicine, talk to your doctor, pharmacist, or health care provider.  2024 Elsevier/Gold Standard (2021-09-04 00:00:00)  

## 2022-11-05 NOTE — Patient Instructions (Signed)

## 2022-11-05 NOTE — Progress Notes (Signed)
[facility] OFFICE PROGRESS NOTE   Diagnosis: Colon cancer  INTERVAL HISTORY:   Louis [name] returns as scheduled.  He continues encorafenib.  He was last treated with Panitumumab 10/15/2022.  He has mild nausea few days after the Panitumumab infusion.  The nausea is relieved with Compazine.  No mouth sores.  Occasional diarrhea.  Skin rash overall is stable, the worst area is the back of his head.  He continues doxycycline.  Objective:  Vital signs in last 24 hours:  Blood pressure (!) 144/62, pulse 97, temperature 97.9 F (36.6 C), temperature source Oral, resp. rate 18, height 6\' 4"  (1.93 m), weight 214 lb (97.1 kg), SpO2 100 %.    HEENT: No thrush or ulcers. Lymphatics: Approximate 1 cm mobile left axillary lymph node.  Approximate 4 cm left inguinal node. Resp: Lungs clear bilaterally. Cardio: Regular rate and rhythm. GI: No hepatosplenomegaly. Vascular: Trace edema lower leg bilaterally. Neuro: Alert and oriented. Skin: Acne type rash scattered over the anterior chest, face, upper back and posterior scalp. Port-A-Cath without erythema.  Lab Results:  Lab Results  Component Value Date   WBC 5.3 11/05/2022   HGB 9.4 (L) 11/05/2022   HCT 30.0 (L) 11/05/2022   MCV 84.7 11/05/2022   PLT 183 11/05/2022   NEUTROABS 3.6 11/05/2022    Imaging:  No results found.  Medications: I have reviewed the patient's current medications.  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), isolated mesenteric implant-resected, multiple tumor deposits Sigmoid/descending resection and creation of a descending colostomy 04/10/2016 MSI-stable, no loss of mismatch repair protein expression Foundation 1- BRAF V600E positive, MS-stable, intermediate tumor mutation burden, no RAS mutation CT abdomen/pelvis 04/02/2016-no evidence of distant metastatic disease Cycle 1 FOLFOX 05/21/2016 Cycle 2 FOLFOX 06/04/2016 Cycle 3 FOLFOX 06/18/2016 Cycle 4 FOLFOX 07/02/2016  (oxaliplatin held secondary to thrombocytopenia) Cycle 5 FOLFOX 07/16/2016 Cycle 6 FOLFOX 07/30/2016 Cycle 7 FOLFOX 08/13/2016 (oxaliplatin held and 5-FU dose reduced) Cycle 8 FOLFOX 09/03/2016 (oxaliplatin held secondary to neuropathy) Cycle 9 FOLFOX 09/17/2016 (oxaliplatin held secondary to neuropathy) Cycle 10 FOLFOX 10/02/2016 Cycle 11 FOLFOX 10/15/2016 (oxaliplatin eliminated from the regimen) Cycle 12 FOLFOX 10/30/2016 (oxaliplatin eliminated) CTs 05/12/2017-no evidence of recurrent disease, right inguinal hernia containing bladder Colonoscopy 07/21/2017, 3 polyps were removed from the descending and transverse colon, fragments of tubular and tubulovillous adenoma CTs 05/19/2018-no evidence of recurrent disease, status post hernia repair, right upper lobe pneumonia CTs 05/20/2019-resolution of right upper lobe pneumonia, no evidence of metastatic disease Colonoscopy 01/25/2020-multiple polyps removed-tubular adenomas, poor preparation, repeat colonoscopy recommended CTs neck, chest, and abdomen/pelvis 08/27/2020- new 2 centimeter left axillary node, new 1.9 cm left inguinal node chronic mildly prominent portacaval node Ultrasound biopsy of left inguinal node on 10/29/2020-metastatic adenocarcinoma consistent with colon adenocarcinoma PET 11/12/2020-enlarged hypermetabolic left inguinal and left axillary nodes, solitary focus of hypermetabolic activity in the right lower quadrant small bowel Cycle 1 FOLFOX 12/19/2020 Cycle 2 FOLFOX 01/02/2021, oxaliplatin dose reduced secondary to neutropenia and thrombocytopenia Cycle 3 FOLFOX 01/16/2021 Cycle 4 FOLFOX 01/30/2021 CTs 02/11/2021-no change in left inguinal and left axillary nodes, no evidence of disease progression, stable wall thickening involving a loop of distal ileum Cycle 1 FOLFIRI/Avastin 02/27/2021 Cycle 2 FOLFIRI/Avastin 03/13/2021 Cycle 3 FOLFIRI/Avastin 04/03/2021 Cycle 4 FOLFIRI 04/17/2021, Avastin held due to elevated urine  protein 05/07/2021-24-hour urine protein elevated 1562 Cycle 5 FOLFIRI 05/08/2021, Avastin held due to elevated urine protein, irinotecan dose reduced due to in general not feeling well after treatment CTs 05/17/2021-left axillary and left inguinal lymph nodes  are smaller, no evidence of disease progression Cycle 6 FOLFIRI 05/22/2021, Avastin held due to proteinuria 06/03/2021 24-hour urine with 1.9 g of protein Cycle 7 FOLFIRI 06/12/2021, Avastin held due to proteinuria Cycle 8 FOLFIRI 07/03/2021, Avastin held due to proteinuria Cycle 9 FOLFIRI 07/24/2021, Avastin held due to proteinuria Cycle 10 FOLFIRI 08/19/2021, Avastin held due to proteinuria CTs 09/02/2021-no change in the left axillary, left inguinal, and portacaval lymph nodes.  No mention of the distal small bowel thickening Maintenance 5-fluorouracil 09/18/2021 CT abdomen/pelvis 10/28/2021-enlarged left inguinal lymph node, stable; stomach and small bowel grossly unremarkable. Maintenance 5-fluorouracil continued Colonoscopy 12/10/2021-terminal ileum with an ulcerated partially obstructing medium-sized mass approximately 15 to 20 cm from the IC valve, invasive adenocarcinoma, colonic type, moderately differentiated (grade 2) PET scan 12/18/2021-progression of disease in the peritoneal space.  2 intensely hypermetabolic nodes in the left axilla, one node is new from the prior, the other is reduced in size and metabolic activity; decrease in size and metabolic activity of left inguinal adenopathy; intense metabolic activity associate with a loop of small bowel in the right lower quadrant, no interval change; new small hypermetabolic peritoneal implant along the ventral peritoneal surface just right of midline; larger new peritoneal implant in the deep right pelvis; small new implant in the right mid abdomen Encorafenib/Panitumumab 01/08/2022 CTs 03/17/2022-decrease size of mesenteric implant at the posterior bladder wall, small mesenteric peritoneal lesions  identified on PET/CT or not evident.  Decrease size of left inguinal left axillary nodes, hypermetabolic activity noted at the anterior left prostate on comparison August 2023 PET CTs 07/21/2022-decrease size of a left axillary lymph node and right pelvic implant.  Left inguinal lymph node measures smaller.  No new sites of disease identified.  Similar tiny pulmonary nodules. Encorafenib and Panitumumab continued, Panitumumab changed from a 2-week schedule to a 3-week schedule 07/23/2022 CT 10/30/2022-stable left axillary lymph node, no new or progressive metastatic disease in the chest; stable left inguinal lymphadenopathy; decrease in size of deep right peritoneal implant; no new metastatic disease in the abdomen/pelvis. encorafenib and Panitumumab continued   2.   Chronic renal insufficiency   3.   Hypertension   4.  Inflamed sebaceous cyst at the upper back-status post incision and drainage   5.  Thrombocytopenia secondary chemotherapy-oxaliplatin dose reduced beginning with cycle 3 FOLFOX, oxaliplatin held with cycle 4 and cycle 7 FOLFOX   6.  Oxaliplatin neuropathy-improved   7.  History of Mucositis secondary chemotherapy   8.  Symptoms of pneumonia January 2020-CT 05/19/2018 consistent with right upper lobe pneumonia, Levaquin prescribed 05/20/2018   9.  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  Anemia, progressive 10/29/2021 2 units packed red blood cells 10/31/2021 Ferritin low 10/29/2021-oral iron Ferritin low 01/22/2022   12.  Colonoscopy 12/10/2021-terminal ileum with an ulcerated partially obstructing medium-sized mass approximately 15 to 20 cm from the IC valve, invasive adenocarcinoma, colonic type, moderately differentiated (grade 2) 13.  Admission 09/02/2022 with atrial flutter and rapid ventricular response  Disposition: Mr. Hussong appears stable.  He is on active treatment with encorafenib and Panitumumab.  Overall he is  tolerating treatment well.  Recent restaging CTs show stable disease, nothing new.  Results/images reviewed with him and his wife at today's visit.  They understand the recommendation to continue the current treatment.  He agrees with this plan.  CBC and chemistry panel reviewed.  Labs adequate to proceed with Panitumumab today as scheduled.  Magnesium is  in normal range.  He will continue oral magnesium replacement.  He will return for follow-up in 3 weeks.  We are available to see him sooner if needed.  Patient seen with Dr. Truett Perna.  Lonna Cobb ANP/GNP-BC   11/05/2022  11:06 AM  This was a shared visit with Lonna Cobb.  LouisGambale is interviewed and examined.  We reviewed the restaging CT findings and images with him.  There is no clinical or radiologic evidence of disease progression.  He appears to be tolerating the panitumumab and encorafenib well.  I recommend continuing the current treatment.  He agrees.  I was present for greater than 50% of today's visit.  I performed medical decision making.  Mancel Bale, MD

## 2022-11-10 ENCOUNTER — Telehealth: Payer: Self-pay | Admitting: Internal Medicine

## 2022-11-10 NOTE — Telephone Encounter (Signed)
Scheduled per 07/03 scheduling message, patient has been called and voicemail was left.

## 2022-11-11 ENCOUNTER — Telehealth: Payer: Self-pay | Admitting: Oncology

## 2022-11-13 DIAGNOSIS — R351 Nocturia: Secondary | ICD-10-CM | POA: Diagnosis not present

## 2022-11-13 DIAGNOSIS — N401 Enlarged prostate with lower urinary tract symptoms: Secondary | ICD-10-CM | POA: Diagnosis not present

## 2022-11-13 DIAGNOSIS — R3914 Feeling of incomplete bladder emptying: Secondary | ICD-10-CM | POA: Diagnosis not present

## 2022-11-17 ENCOUNTER — Other Ambulatory Visit (HOSPITAL_BASED_OUTPATIENT_CLINIC_OR_DEPARTMENT_OTHER): Payer: Self-pay

## 2022-11-17 DIAGNOSIS — Z23 Encounter for immunization: Secondary | ICD-10-CM | POA: Diagnosis not present

## 2022-11-17 MED ORDER — COMIRNATY 30 MCG/0.3ML IM SUSY
0.5000 mL | PREFILLED_SYRINGE | Freq: Once | INTRAMUSCULAR | 0 refills | Status: AC
  Filled 2022-11-17: qty 0.3, 1d supply, fill #0

## 2022-11-19 ENCOUNTER — Encounter: Payer: Self-pay | Admitting: Cardiology

## 2022-11-19 ENCOUNTER — Ambulatory Visit: Payer: Medicare Other | Attending: Cardiology | Admitting: Cardiology

## 2022-11-19 VITALS — BP 148/76 | HR 82 | Ht 76.0 in | Wt 215.2 lb

## 2022-11-19 DIAGNOSIS — D6869 Other thrombophilia: Secondary | ICD-10-CM | POA: Diagnosis not present

## 2022-11-19 DIAGNOSIS — I480 Paroxysmal atrial fibrillation: Secondary | ICD-10-CM

## 2022-11-19 DIAGNOSIS — I483 Typical atrial flutter: Secondary | ICD-10-CM | POA: Diagnosis not present

## 2022-11-19 DIAGNOSIS — I10 Essential (primary) hypertension: Secondary | ICD-10-CM | POA: Diagnosis not present

## 2022-11-19 NOTE — Patient Instructions (Signed)

## 2022-11-19 NOTE — Progress Notes (Signed)
  Electrophysiology Office Note:   Date:  11/19/2022  ID:  Louis Endow., DOB Jun 17, 1945, MRN [id number]  Primary Cardiologist: None Electrophysiologist: Afra Tricarico Louis Loa, MD      History of Present Illness:   Louis [name]. is a 77 y.o. male with h/o colon cancer, aortic atherosclerosis, stage IV CKD, left bundle branch block, ascending aortic aneurysm, atrial flutter seen today for routine electrophysiology followup.  Since last being seen in our clinic the patient reports doing well since his ablation.  He had atrial flutter ablation 10/10/2022.  Since ablation, he has had 3 episodes of atrial fibrillation.  During his first episode, he presented to the emergency room and converted to sinus rhythm.  His next 2 episodes were associated with drinking alcohol.  When he has not had alcohol, he has had no further episodes of atrial fibrillation.  He is felt well and has been able to do his daily activities.  he denies chest pain, palpitations, dyspnea, PND, orthopnea, nausea, vomiting, dizziness, syncope, edema, weight gain, or early satiety.   Review of systems complete and found to be negative unless listed in HPI.   EP Information / Studies Reviewed:    EKG is ordered today. Personal review as below.  EKG Interpretation Date/Time:  Wednesday November 19 2022 15:56:43 EDT Ventricular Rate:  82 PR Interval:  170 QRS Duration:  168 QT Interval:  426 QTC Calculation: 497 R Axis:   -7  Text Interpretation: Sinus rhythm with Premature atrial complexes Left bundle branch block When compared with ECG of 22-Oct-2022 11:33, Sinus rhythm Confirmed by Gibril Mastro ([id number]) on 11/19/2022 4:05:23 PM     Risk Assessment/Calculations:    CHA2DS2-VASc Score = 4   This indicates a 4.8% annual risk of stroke. The patient's score is based upon: CHF History: 0 HTN History: 1 Diabetes History: 0 Stroke History: 0 Vascular Disease History: 1 Age Score: 2 Gender Score: 0       Physical Exam:   VS:  BP (!) 148/76   Pulse 82   Ht 6\' 4"  (1.93 m)   Wt 215 lb 3.2 oz (97.6 kg)   SpO2 99%   BMI 26.19 kg/m    Wt Readings from Last 3 Encounters:  11/19/22 215 lb 3.2 oz (97.6 kg)  11/05/22 214 lb (97.1 kg)  10/15/22 209 lb (94.8 kg)     GEN: Well nourished, well developed in no acute distress NECK: No JVD; No carotid bruits CARDIAC: Regular rate and rhythm with occasional ectopy, no murmurs, rubs, gallops RESPIRATORY:  Clear to auscultation without rales, wheezing or rhonchi  ABDOMEN: Soft, non-tender, non-distended EXTREMITIES:  No edema; No deformity   ASSESSMENT AND PLAN:    1.  Persistent atrial fibrillation/flutter: Currently on Eliquis.  He is post ablation for atrial flutter on 09/09/2022.  He is unfortunately continued to have episodes of atrial fibrillation.  These episodes have been associated with drinking alcohol.  For now, he would like to hold off on further therapy.  2.  Secondary hypercoagulable state: Currently on Eliquis for atrial fibrillation  3.  Hypertension: Currently elevated.  Usually well-controlled.  No changes.  Plan per primary physician.  Follow up with Dr. Elberta Fortis in 6 months  Signed, Rogue Rafalski Louis Loa, MD

## 2022-11-20 ENCOUNTER — Other Ambulatory Visit: Payer: Self-pay | Admitting: Oncology

## 2022-11-25 ENCOUNTER — Inpatient Hospital Stay (HOSPITAL_BASED_OUTPATIENT_CLINIC_OR_DEPARTMENT_OTHER): Payer: Medicare Other | Admitting: Nurse Practitioner

## 2022-11-25 ENCOUNTER — Inpatient Hospital Stay: Payer: Medicare Other

## 2022-11-25 ENCOUNTER — Encounter: Payer: Self-pay | Admitting: Nurse Practitioner

## 2022-11-25 ENCOUNTER — Encounter: Payer: Self-pay | Admitting: *Deleted

## 2022-11-25 ENCOUNTER — Other Ambulatory Visit: Payer: Self-pay | Admitting: *Deleted

## 2022-11-25 VITALS — BP 137/68 | HR 72 | Temp 98.1°F | Resp 18 | Ht 76.0 in | Wt 215.8 lb

## 2022-11-25 VITALS — BP 147/72 | HR 58

## 2022-11-25 DIAGNOSIS — N189 Chronic kidney disease, unspecified: Secondary | ICD-10-CM | POA: Diagnosis not present

## 2022-11-25 DIAGNOSIS — C189 Malignant neoplasm of colon, unspecified: Secondary | ICD-10-CM

## 2022-11-25 DIAGNOSIS — C187 Malignant neoplasm of sigmoid colon: Secondary | ICD-10-CM | POA: Diagnosis not present

## 2022-11-25 DIAGNOSIS — D649 Anemia, unspecified: Secondary | ICD-10-CM | POA: Diagnosis not present

## 2022-11-25 DIAGNOSIS — I129 Hypertensive chronic kidney disease with stage 1 through stage 4 chronic kidney disease, or unspecified chronic kidney disease: Secondary | ICD-10-CM | POA: Diagnosis not present

## 2022-11-25 DIAGNOSIS — D6959 Other secondary thrombocytopenia: Secondary | ICD-10-CM | POA: Diagnosis not present

## 2022-11-25 DIAGNOSIS — Z5112 Encounter for antineoplastic immunotherapy: Secondary | ICD-10-CM | POA: Diagnosis not present

## 2022-11-25 LAB — CBC WITH DIFFERENTIAL (CANCER CENTER ONLY)
Abs Immature Granulocytes: 0.04 10*3/uL (ref 0.00–0.07)
Basophils Absolute: 0 10*3/uL (ref 0.0–0.1)
Basophils Relative: 1 %
Eosinophils Absolute: 0.2 10*3/uL (ref 0.0–0.5)
Eosinophils Relative: 3 %
HCT: 28.8 % — ABNORMAL LOW (ref 39.0–52.0)
Hemoglobin: 8.8 g/dL — ABNORMAL LOW (ref 13.0–17.0)
Immature Granulocytes: 1 %
Lymphocytes Relative: 23 %
Lymphs Abs: 1.8 10*3/uL (ref 0.7–4.0)
MCH: 26.1 pg (ref 26.0–34.0)
MCHC: 30.6 g/dL (ref 30.0–36.0)
MCV: 85.5 fL (ref 80.0–100.0)
Monocytes Absolute: 0.7 10*3/uL (ref 0.1–1.0)
Monocytes Relative: 9 %
Neutro Abs: 4.8 10*3/uL (ref 1.7–7.7)
Neutrophils Relative %: 63 %
Platelet Count: 229 10*3/uL (ref 150–400)
RBC: 3.37 MIL/uL — ABNORMAL LOW (ref 4.22–5.81)
RDW: 16.4 % — ABNORMAL HIGH (ref 11.5–15.5)
WBC Count: 7.6 10*3/uL (ref 4.0–10.5)
nRBC: 0 % (ref 0.0–0.2)

## 2022-11-25 LAB — CMP (CANCER CENTER ONLY)
ALT: 10 U/L (ref 0–44)
AST: 16 U/L (ref 15–41)
Albumin: 3.4 g/dL — ABNORMAL LOW (ref 3.5–5.0)
Alkaline Phosphatase: 98 U/L (ref 38–126)
Anion gap: 6 (ref 5–15)
BUN: 26 mg/dL — ABNORMAL HIGH (ref 8–23)
CO2: 21 mmol/L — ABNORMAL LOW (ref 22–32)
Calcium: 8.5 mg/dL — ABNORMAL LOW (ref 8.9–10.3)
Chloride: 112 mmol/L — ABNORMAL HIGH (ref 98–111)
Creatinine: 2.04 mg/dL — ABNORMAL HIGH (ref 0.61–1.24)
GFR, Estimated: 33 mL/min — ABNORMAL LOW (ref >60)
Glucose, Bld: 124 mg/dL — ABNORMAL HIGH (ref 70–99)
Potassium: 4.3 mmol/L (ref 3.5–5.1)
Sodium: 139 mmol/L (ref 135–145)
Total Bilirubin: 0.2 mg/dL — ABNORMAL LOW (ref 0.3–1.2)
Total Protein: 6.6 g/dL (ref 6.5–8.1)

## 2022-11-25 LAB — MAGNESIUM: Magnesium: 2 mg/dL (ref 1.7–2.4)

## 2022-11-25 LAB — CEA (ACCESS): CEA (CHCC): 1.89 ng/mL (ref 0.00–5.00)

## 2022-11-25 LAB — FERRITIN: Ferritin: 14 ng/mL — ABNORMAL LOW (ref 24–336)

## 2022-11-25 MED ORDER — SODIUM CHLORIDE 0.9 % IV SOLN: Freq: Once | INTRAVENOUS | Status: AC

## 2022-11-25 MED ORDER — SODIUM CHLORIDE 0.9 % IV SOLN
4.0000 mg/kg | Freq: Once | INTRAVENOUS | Status: AC
  Administered 2022-11-25: 400 mg via INTRAVENOUS
  Filled 2022-11-25: qty 20

## 2022-11-25 MED ORDER — HEPARIN SOD (PORK) LOCK FLUSH 100 UNIT/ML IV SOLN
500.0000 [IU] | Freq: Once | INTRAVENOUS | Status: AC | PRN
  Administered 2022-11-25: 500 [IU]

## 2022-11-25 MED ORDER — SODIUM CHLORIDE 0.9% FLUSH
10.0000 mL | INTRAVENOUS | Status: DC | PRN
  Administered 2022-11-25: 10 mL

## 2022-11-25 NOTE — Progress Notes (Signed)
[facility] OFFICE PROGRESS NOTE   Diagnosis: Colon cancer  INTERVAL HISTORY:   Louis [name] returns as scheduled.  He continues encorafenib.  He was last treated with Panitumumab 11/05/2022.  The skin rash worsened particularly at the posterior scalp with associated pain following the last infusion.  Rash is now at baseline.  He denies nausea/vomiting.  No mouth sores.  No significant diarrhea.  He has a good appetite.  Objective:  Vital signs in last 24 hours:  Blood pressure (!) 141/67, pulse 72, temperature 98.1 F (36.7 C), temperature source Oral, resp. rate 18, height 6\' 4"  (1.93 m), weight 215 lb 12.8 oz (97.9 kg), SpO2 100%.    HEENT: No thrush or ulcers. Lymphatics: Approximate 1 cm left axillary lymph node.  Approximate 4 cm left inguinal node. Resp: Lungs clear bilaterally. Cardio: Regular rate and rhythm. GI: No hepatosplenomegaly. Vascular: No leg edema. Skin: Acne type rash scattered over the chest, face, upper back and posterior scalp.  At the posterior scalp there are multiple tiny scabbed lesions. Port-A-Cath without erythema.  Lab Results:  Lab Results  Component Value Date   WBC 7.6 11/25/2022   HGB 8.8 (L) 11/25/2022   HCT 28.8 (L) 11/25/2022   MCV 85.5 11/25/2022   PLT 229 11/25/2022   NEUTROABS 4.8 11/25/2022    Imaging:  No results found.  Medications: I have reviewed the patient's current medications.  Assessment/Plan: Sigmoid colon cancer, stage IV (U1L,K4M,W1U), isolated mesenteric implant-resected, multiple tumor deposits Sigmoid/descending resection and creation of a descending colostomy 04/10/2016 MSI-stable, no loss of mismatch repair protein expression Foundation 1- BRAF V600E positive, MS-stable, intermediate tumor mutation burden, no RAS mutation CT abdomen/pelvis 04/02/2016-no evidence of distant metastatic disease Cycle 1 FOLFOX 05/21/2016 Cycle 2 FOLFOX 06/04/2016 Cycle 3 FOLFOX 06/18/2016 Cycle 4 FOLFOX 07/02/2016  (oxaliplatin held secondary to thrombocytopenia) Cycle 5 FOLFOX 07/16/2016 Cycle 6 FOLFOX 07/30/2016 Cycle 7 FOLFOX 08/13/2016 (oxaliplatin held and 5-FU dose reduced) Cycle 8 FOLFOX 09/03/2016 (oxaliplatin held secondary to neuropathy) Cycle 9 FOLFOX 09/17/2016 (oxaliplatin held secondary to neuropathy) Cycle 10 FOLFOX 10/02/2016 Cycle 11 FOLFOX 10/15/2016 (oxaliplatin eliminated from the regimen) Cycle 12 FOLFOX 10/30/2016 (oxaliplatin eliminated) CTs 05/12/2017-no evidence of recurrent disease, right inguinal hernia containing bladder Colonoscopy 07/21/2017, 3 polyps were removed from the descending and transverse colon, fragments of tubular and tubulovillous adenoma CTs 05/19/2018-no evidence of recurrent disease, status post hernia repair, right upper lobe pneumonia CTs 05/20/2019-resolution of right upper lobe pneumonia, no evidence of metastatic disease Colonoscopy 01/25/2020-multiple polyps removed-tubular adenomas, poor preparation, repeat colonoscopy recommended CTs neck, chest, and abdomen/pelvis 08/27/2020- new 2 centimeter left axillary node, new 1.9 cm left inguinal node chronic mildly prominent portacaval node Ultrasound biopsy of left inguinal node on 10/29/2020-metastatic adenocarcinoma consistent with colon adenocarcinoma PET 11/12/2020-enlarged hypermetabolic left inguinal and left axillary nodes, solitary focus of hypermetabolic activity in the right lower quadrant small bowel Cycle 1 FOLFOX 12/19/2020 Cycle 2 FOLFOX 01/02/2021, oxaliplatin dose reduced secondary to neutropenia and thrombocytopenia Cycle 3 FOLFOX 01/16/2021 Cycle 4 FOLFOX 01/30/2021 CTs 02/11/2021-no change in left inguinal and left axillary nodes, no evidence of disease progression, stable wall thickening involving a loop of distal ileum Cycle 1 FOLFIRI/Avastin 02/27/2021 Cycle 2 FOLFIRI/Avastin 03/13/2021 Cycle 3 FOLFIRI/Avastin 04/03/2021 Cycle 4 FOLFIRI 04/17/2021, Avastin held due to elevated urine  protein 05/07/2021-24-hour urine protein elevated 1562 Cycle 5 FOLFIRI 05/08/2021, Avastin held due to elevated urine protein, irinotecan dose reduced due to in general not feeling well after treatment CTs 05/17/2021-left axillary and left inguinal lymph  nodes are smaller, no evidence of disease progression Cycle 6 FOLFIRI 05/22/2021, Avastin held due to proteinuria 06/03/2021 24-hour urine with 1.9 g of protein Cycle 7 FOLFIRI 06/12/2021, Avastin held due to proteinuria Cycle 8 FOLFIRI 07/03/2021, Avastin held due to proteinuria Cycle 9 FOLFIRI 07/24/2021, Avastin held due to proteinuria Cycle 10 FOLFIRI 08/19/2021, Avastin held due to proteinuria CTs 09/02/2021-no change in the left axillary, left inguinal, and portacaval lymph nodes.  No mention of the distal small bowel thickening Maintenance 5-fluorouracil 09/18/2021 CT abdomen/pelvis 10/28/2021-enlarged left inguinal lymph node, stable; stomach and small bowel grossly unremarkable. Maintenance 5-fluorouracil continued Colonoscopy 12/10/2021-terminal ileum with an ulcerated partially obstructing medium-sized mass approximately 15 to 20 cm from the IC valve, invasive adenocarcinoma, colonic type, moderately differentiated (grade 2) PET scan 12/18/2021-progression of disease in the peritoneal space.  2 intensely hypermetabolic nodes in the left axilla, one node is new from the prior, the other is reduced in size and metabolic activity; decrease in size and metabolic activity of left inguinal adenopathy; intense metabolic activity associate with a loop of small bowel in the right lower quadrant, no interval change; new small hypermetabolic peritoneal implant along the ventral peritoneal surface just right of midline; larger new peritoneal implant in the deep right pelvis; small new implant in the right mid abdomen Encorafenib/Panitumumab 01/08/2022 CTs 03/17/2022-decrease size of mesenteric implant at the posterior bladder wall, small mesenteric peritoneal lesions  identified on PET/CT or not evident.  Decrease size of left inguinal left axillary nodes, hypermetabolic activity noted at the anterior left prostate on comparison August 2023 PET CTs 07/21/2022-decrease size of a left axillary lymph node and right pelvic implant.  Left inguinal lymph node measures smaller.  No new sites of disease identified.  Similar tiny pulmonary nodules. Encorafenib and Panitumumab continued, Panitumumab changed from a 2-week schedule to a 3-week schedule 07/23/2022 CT 10/30/2022-stable left axillary lymph node, no new or progressive metastatic disease in the chest; stable left inguinal lymphadenopathy; decrease in size of deep right peritoneal implant; no new metastatic disease in the abdomen/pelvis. encorafenib and Panitumumab continued   2.   Chronic renal insufficiency   3.   Hypertension   4.  Inflamed sebaceous cyst at the upper back-status post incision and drainage   5.  Thrombocytopenia secondary chemotherapy-oxaliplatin dose reduced beginning with cycle 3 FOLFOX, oxaliplatin held with cycle 4 and cycle 7 FOLFOX   6.  Oxaliplatin neuropathy-improved   7.  History of Mucositis secondary chemotherapy   8.  Symptoms of pneumonia January 2020-CT 05/19/2018 consistent with right upper lobe pneumonia, Levaquin prescribed 05/20/2018   9.  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  Anemia, progressive 10/29/2021 2 units packed red blood cells 10/31/2021 Ferritin low 10/29/2021-oral iron Ferritin low 01/22/2022   12.  Colonoscopy 12/10/2021-terminal ileum with an ulcerated partially obstructing medium-sized mass approximately 15 to 20 cm from the IC valve, invasive adenocarcinoma, colonic type, moderately differentiated (grade 2) 13.  Admission 09/02/2022 with atrial flutter and rapid ventricular response  Disposition: Louis [name] appears stable.  He is on active treatment with Panitumumab and encorafenib.  He is tolerating  well aside from the skin rash which appears consistent with a Panitumumab rash.  The rash flared following the last treatment affecting the posterior scalp with associated pain.  Rash is now at baseline.  We decided to reduce the dose of Panitumumab with today's treatment.  CBC and chemistry panel reviewed.  Labs adequate to proceed  as above.  He has progressive anemia, asymptomatic.  We will add ferritin to today's blood work.  He will return for follow-up and treatment in 4 weeks rather than 3 due to a planned vacation.    Lonna Cobb ANP/GNP-BC   11/25/2022  1:34 PM

## 2022-11-25 NOTE — Patient Instructions (Signed)
[facility]  Discharge Instructions: Thank you for choosing [facility] to provide your oncology and hematology care.   If you have a lab appointment with the Cancer Center, please go directly to the Cancer Center and check in at the registration area.   Wear comfortable clothing and clothing appropriate for easy access to any Portacath or PICC line.   We strive to give you quality time with your provider. You may need to reschedule your appointment if you arrive late (15 or more minutes).  Arriving late affects you and other patients whose appointments are after yours.  Also, if you miss three or more appointments without notifying the office, you may be dismissed from the clinic at the provider's discretion.      For prescription refill requests, have your pharmacy contact our office and allow 72 hours for refills to be completed.    Today you received the following chemotherapy and/or immunotherapy agents Panitumumab.      To help prevent nausea and vomiting after your treatment, we encourage you to take your nausea medication as directed.  BELOW ARE SYMPTOMS THAT SHOULD BE REPORTED IMMEDIATELY: *FEVER GREATER THAN 100.4 F (38 C) OR HIGHER *CHILLS OR SWEATING *NAUSEA AND VOMITING THAT IS NOT CONTROLLED WITH YOUR NAUSEA MEDICATION *UNUSUAL SHORTNESS OF BREATH *UNUSUAL BRUISING OR BLEEDING *URINARY PROBLEMS (pain or burning when urinating, or frequent urination) *BOWEL PROBLEMS (unusual diarrhea, constipation, pain near the anus) TENDERNESS IN MOUTH AND THROAT WITH OR WITHOUT PRESENCE OF ULCERS (sore throat, sores in mouth, or a toothache) UNUSUAL RASH, SWELLING OR PAIN  UNUSUAL VAGINAL DISCHARGE OR ITCHING   Items with * indicate a potential emergency and should be followed up as soon as possible or go to the Emergency Department if any problems should occur.  Please show the CHEMOTHERAPY ALERT CARD or IMMUNOTHERAPY ALERT CARD at  check-in to the Emergency Department and triage nurse.  Should you have questions after your visit or need to cancel or reschedule your appointment, please contact [facility]  Dept: [phone]  and follow the prompts.  Office hours are 8:00 a.m. to 4:30 p.m. Monday - Friday. Please note that voicemails left after 4:00 p.m. may not be returned until the following business day.  We are closed weekends and major holidays. You have access to a nurse at all times for urgent questions. Please call the main number to the clinic Dept: [phone] and follow the prompts.   For any non-urgent questions, you may also contact your provider using MyChart. We now offer e-Visits for anyone 18 and older to request care online for non-urgent symptoms. For details visit [email or web address].   Also download the MyChart app! Go to the app store, search "MyChart", open the app, select [facility], and log in with your MyChart username and password.  Panitumumab Injection What is this medication? PANITUMUMAB (pan i TOOM ue mab) treats colorectal cancer. It works by blocking a protein that causes cancer cells to grow and multiply. This helps to slow or stop the spread of cancer cells. It is a monoclonal antibody. This medicine may be used for other purposes; ask your health care provider or pharmacist if you have questions. COMMON BRAND NAME(S): Vectibix What should I tell my care team before I take this medication? They need to know if you have any of these conditions: Eye disease Low levels of magnesium in the blood Lung disease An unusual or allergic reaction to   panitumumab, other medications, foods, dyes, or preservatives Pregnant or trying to get pregnant Breast-feeding How should I use this medication? This medication is injected into a vein. It is given by your care team in a hospital or clinic setting. Talk to your care team about the use of this medication in  children. Special care may be needed. Overdosage: If you think you have taken too much of this medicine contact a poison control center or emergency room at once. NOTE: This medicine is only for you. Do not share this medicine with others. What if I miss a dose? Keep appointments for follow-up doses. It is important not to miss your dose. Call your care team if you are unable to keep an appointment. What may interact with this medication? Bevacizumab This list may not describe all possible interactions. Give your health care provider a list of all the medicines, herbs, non-prescription drugs, or dietary supplements you use. Also tell them if you smoke, drink alcohol, or use illegal drugs. Some items may interact with your medicine. What should I watch for while using this medication? Your condition will be monitored carefully while you are receiving this medication. This medication may make you feel generally unwell. This is not uncommon as chemotherapy can affect healthy cells as well as cancer cells. Report any side effects. Continue your course of treatment even though you feel ill unless your care team tells you to stop. This medication can make you more sensitive to the sun. Keep out of the sun while receiving this medication and for 2 months after stopping therapy. If you cannot avoid being in the sun, wear protective clothing and sunscreen. Do not use sun lamps, tanning beds, or tanning booths. Check with your care team if you have severe diarrhea, nausea, and vomiting or if you sweat a lot. The loss of too much body fluid may make it dangerous for you to take this medication. This medication may cause serious skin reactions. They can happen weeks to months after starting the medication. Contact your care team right away if you notice fevers or flu-like symptoms with a rash. The rash may be red or purple and then turn into blisters or peeling of the skin. You may also notice a red rash with  swelling of the face, lips, or lymph nodes in your neck or under your arms. Talk to your care team if you may be pregnant. Serious birth defects can occur if you take this medication during pregnancy and for 2 months after the last dose. Contraception is recommended while taking this medication and for 2 months after the last dose. Your care team can help you find the option that works for you. Do not breastfeed while taking this medication and for 2 months after the last dose. This medication may cause infertility. Talk to your care team if you are concerned about your fertility. What side effects may I notice from receiving this medication? Side effects that you should report to your care team as soon as possible: Allergic reactions--skin rash, itching, hives, swelling of the face, lips, tongue, or throat Dry cough, shortness of breath or trouble breathing Eye pain, redness, irritation, or discharge with blurry or decreased vision Infusion reactions--chest pain, shortness of breath or trouble breathing, feeling faint or lightheaded Low magnesium level--muscle pain or cramps, unusual weakness or fatigue, fast or irregular heartbeat, tremors Low potassium level--muscle pain or cramps, unusual weakness or fatigue, fast or irregular heartbeat, constipation Redness, blistering, peeling, or loosening of the   skin, including inside the mouth Skin reactions on sun-exposed areas Side effects that usually do not require medical attention (report to your care team if they continue or are bothersome): Change in nail shape, thickness, or color Diarrhea Dry skin Fatigue Nausea Vomiting This list may not describe all possible side effects. Call your doctor for medical advice about side effects. You may report side effects to FDA at 1-800-FDA-1088. Where should I keep my medication? This medication is given in a hospital or clinic. It will not be stored at home. NOTE: This sheet is a summary. It may not  cover all possible information. If you have questions about this medicine, talk to your doctor, pharmacist, or health care provider.  2024 Elsevier/Gold Standard (2021-09-04 00:00:00)  

## 2022-11-25 NOTE — Progress Notes (Signed)
Patient seen by Lonna Cobb NP today  Vitals are within treatment parameters.  Labs reviewed by Lonna Cobb NP and are not all within treatment parameters. Creatinine 2.04 --OK to proceed  Per physician team, patient is ready for treatment. Please note that modifications are being made to the treatment plan including Dose reduction of panitumumab due to rash

## 2022-11-26 ENCOUNTER — Telehealth: Payer: Self-pay

## 2022-11-26 ENCOUNTER — Other Ambulatory Visit: Payer: Self-pay

## 2022-11-26 ENCOUNTER — Other Ambulatory Visit: Payer: Self-pay | Admitting: Nurse Practitioner

## 2022-11-26 NOTE — Telephone Encounter (Signed)
The patient has confirmed their appointment for IV iron infusion, and they have scheduled accordingly and are aware of the appointment time.

## 2022-11-26 NOTE — Telephone Encounter (Signed)
-----   Message from Lonna Cobb sent at 11/25/2022  3:15 PM EDT ----- Please let him know he has recurrent iron deficiency.  He has had IV Feraheme in the past.  Recommend he have Feraheme weekly x 2.  Let me know his preference to schedule this and I will put the orders in.  Thanks

## 2022-11-27 ENCOUNTER — Inpatient Hospital Stay: Payer: Medicare Other

## 2022-11-27 VITALS — BP 130/63 | HR 68 | Temp 98.2°F | Resp 18

## 2022-11-27 DIAGNOSIS — D6959 Other secondary thrombocytopenia: Secondary | ICD-10-CM | POA: Diagnosis not present

## 2022-11-27 DIAGNOSIS — I129 Hypertensive chronic kidney disease with stage 1 through stage 4 chronic kidney disease, or unspecified chronic kidney disease: Secondary | ICD-10-CM | POA: Diagnosis not present

## 2022-11-27 DIAGNOSIS — Z95828 Presence of other vascular implants and grafts: Secondary | ICD-10-CM

## 2022-11-27 DIAGNOSIS — D649 Anemia, unspecified: Secondary | ICD-10-CM | POA: Diagnosis not present

## 2022-11-27 DIAGNOSIS — C187 Malignant neoplasm of sigmoid colon: Secondary | ICD-10-CM | POA: Diagnosis not present

## 2022-11-27 DIAGNOSIS — Z5112 Encounter for antineoplastic immunotherapy: Secondary | ICD-10-CM | POA: Diagnosis not present

## 2022-11-27 DIAGNOSIS — N189 Chronic kidney disease, unspecified: Secondary | ICD-10-CM | POA: Diagnosis not present

## 2022-11-27 MED ORDER — HEPARIN SOD (PORK) LOCK FLUSH 100 UNIT/ML IV SOLN
500.0000 [IU] | Freq: Once | INTRAVENOUS | Status: AC | PRN
  Administered 2022-11-27: 500 [IU]

## 2022-11-27 MED ORDER — SODIUM CHLORIDE 0.9% FLUSH
10.0000 mL | Freq: Once | INTRAVENOUS | Status: AC | PRN
  Administered 2022-11-27: 10 mL

## 2022-11-27 MED ORDER — SODIUM CHLORIDE 0.9 % IV SOLN: Freq: Once | INTRAVENOUS | Status: AC

## 2022-11-27 MED ORDER — SODIUM CHLORIDE 0.9 % IV SOLN
510.0000 mg | Freq: Once | INTRAVENOUS | Status: AC
  Administered 2022-11-27: 510 mg via INTRAVENOUS
  Filled 2022-11-27: qty 510

## 2022-11-27 NOTE — Patient Instructions (Signed)
Ferumoxytol Injection What is this medication? FERUMOXYTOL (FER ue MOX i tol) treats low levels of iron in your body (iron deficiency anemia). Iron is a mineral that plays an important role in making red blood cells, which carry oxygen from your lungs to the rest of your body. This medicine may be used for other purposes; ask your health care provider or pharmacist if you have questions. COMMON BRAND NAME(S): Feraheme What should I tell my care team before I take this medication? They need to know if you have any of these conditions: Anemia not caused by low iron levels High levels of iron in the blood Magnetic resonance imaging (MRI) test scheduled An unusual or allergic reaction to iron, other medications, foods, dyes, or preservatives Pregnant or trying to get pregnant Breastfeeding How should I use this medication? This medication is injected into a vein. It is given by your care team in a hospital or clinic setting. Talk to your care team the use of this medication in children. Special care may be needed. Overdosage: If you think you have taken too much of this medicine contact a poison control center or emergency room at once. NOTE: This medicine is only for you. Do not share this medicine with others. What if I miss a dose? It is important not to miss your dose. Call your care team if you are unable to keep an appointment. What may interact with this medication? Other iron products This list may not describe all possible interactions. Give your health care provider a list of all the medicines, herbs, non-prescription drugs, or dietary supplements you use. Also tell them if you smoke, drink alcohol, or use illegal drugs. Some items may interact with your medicine. What should I watch for while using this medication? Visit your care team regularly. Tell your care team if your symptoms do not start to get better or if they get worse. You may need blood work done while you are taking this  medication. You may need to follow a special diet. Talk to your care team. Foods that contain iron include: whole grains/cereals, dried fruits, beans, or peas, leafy green vegetables, and organ meats (liver, kidney). What side effects may I notice from receiving this medication? Side effects that you should report to your care team as soon as possible: Allergic reactions--skin rash, itching, hives, swelling of the face, lips, tongue, or throat Low blood pressure--dizziness, feeling faint or lightheaded, blurry vision Shortness of breath Side effects that usually do not require medical attention (report to your care team if they continue or are bothersome): Flushing Headache Joint pain Muscle pain Nausea Pain, redness, or irritation at injection site This list may not describe all possible side effects. Call your doctor for medical advice about side effects. You may report side effects to FDA at 1-800-FDA-1088. Where should I keep my medication? This medication is given in a hospital or clinic. It will not be stored at home. NOTE: This sheet is a summary. It may not cover all possible information. If you have questions about this medicine, talk to your doctor, pharmacist, or health care provider.  2024 Elsevier/Gold Standard (2022-09-26 00:00:00)

## 2022-11-28 ENCOUNTER — Other Ambulatory Visit: Payer: Self-pay

## 2022-12-01 ENCOUNTER — Telehealth: Payer: Self-pay | Admitting: *Deleted

## 2022-12-01 ENCOUNTER — Other Ambulatory Visit: Payer: Self-pay | Admitting: *Deleted

## 2022-12-01 ENCOUNTER — Inpatient Hospital Stay: Payer: Medicare Other

## 2022-12-01 ENCOUNTER — Ambulatory Visit: Payer: Medicare Other | Admitting: Dermatology

## 2022-12-01 ENCOUNTER — Telehealth: Payer: Self-pay | Admitting: Cardiology

## 2022-12-01 DIAGNOSIS — D649 Anemia, unspecified: Secondary | ICD-10-CM

## 2022-12-01 DIAGNOSIS — I129 Hypertensive chronic kidney disease with stage 1 through stage 4 chronic kidney disease, or unspecified chronic kidney disease: Secondary | ICD-10-CM | POA: Diagnosis not present

## 2022-12-01 DIAGNOSIS — Z5112 Encounter for antineoplastic immunotherapy: Secondary | ICD-10-CM | POA: Diagnosis not present

## 2022-12-01 DIAGNOSIS — D6959 Other secondary thrombocytopenia: Secondary | ICD-10-CM | POA: Diagnosis not present

## 2022-12-01 DIAGNOSIS — C187 Malignant neoplasm of sigmoid colon: Secondary | ICD-10-CM | POA: Diagnosis not present

## 2022-12-01 DIAGNOSIS — N189 Chronic kidney disease, unspecified: Secondary | ICD-10-CM | POA: Diagnosis not present

## 2022-12-01 LAB — TYPE AND SCREEN
ABO/RH(D): A POS
Antibody Screen: NEGATIVE
Unit division: 0

## 2022-12-01 LAB — CBC WITH DIFFERENTIAL (CANCER CENTER ONLY)
Abs Immature Granulocytes: 0.06 10*3/uL (ref 0.00–0.07)
Basophils Absolute: 0.1 10*3/uL (ref 0.0–0.1)
Basophils Relative: 1 %
Eosinophils Absolute: 0.3 10*3/uL (ref 0.0–0.5)
Eosinophils Relative: 2 %
HCT: 28.3 % — ABNORMAL LOW (ref 39.0–52.0)
Hemoglobin: 8.7 g/dL — ABNORMAL LOW (ref 13.0–17.0)
Immature Granulocytes: 1 %
Lymphocytes Relative: 9 %
Lymphs Abs: 1 10*3/uL (ref 0.7–4.0)
MCH: 26 pg (ref 26.0–34.0)
MCHC: 30.7 g/dL (ref 30.0–36.0)
MCV: 84.5 fL (ref 80.0–100.0)
Monocytes Absolute: 1.1 10*3/uL — ABNORMAL HIGH (ref 0.1–1.0)
Monocytes Relative: 10 %
Neutro Abs: 8.4 10*3/uL — ABNORMAL HIGH (ref 1.7–7.7)
Neutrophils Relative %: 77 %
Platelet Count: 229 10*3/uL (ref 150–400)
RBC: 3.35 MIL/uL — ABNORMAL LOW (ref 4.22–5.81)
RDW: 17.7 % — ABNORMAL HIGH (ref 11.5–15.5)
WBC Count: 10.9 10*3/uL — ABNORMAL HIGH (ref 4.0–10.5)
nRBC: 0 % (ref 0.0–0.2)

## 2022-12-01 LAB — BPAM RBC
Blood Product Expiration Date: 202408232359
Unit Type and Rh: 6200

## 2022-12-01 LAB — SAMPLE TO BLOOD BANK

## 2022-12-01 LAB — PREPARE RBC (CROSSMATCH)

## 2022-12-01 MED ORDER — SODIUM CHLORIDE 0.9% FLUSH
10.0000 mL | Freq: Once | INTRAVENOUS | Status: AC
  Administered 2022-12-01: 10 mL via INTRAVENOUS

## 2022-12-01 MED ORDER — HEPARIN SOD (PORK) LOCK FLUSH 100 UNIT/ML IV SOLN
500.0000 [IU] | Freq: Once | INTRAVENOUS | Status: AC
  Administered 2022-12-01: 500 [IU] via INTRAVENOUS

## 2022-12-01 NOTE — Telephone Encounter (Signed)
Per Dr. Truett Perna: Bring him in today for CBC/sample to blood bank. His fatigue cold be due to A. Fib.

## 2022-12-01 NOTE — Progress Notes (Signed)
Labs were obtained on this patient today. Patient refused to let this RN place blood bracelet on his wrist.  This RN explained the importance of having this blood bracelet if he needs to get a blood transfusion. Patient placed bracelet in his pant pocket.

## 2022-12-01 NOTE — Telephone Encounter (Signed)
Spoke with the patient who states that since seeing Dr. Elberta Fortis he has gone into AFIB 3 times. He states that episodes last about an hour and he becomes tired/fatigued. He has to rest until he goes back into normal rhythm. He would like to know if he is okay to travel up [place]. He is taking Eliquis as prescribed. Advised that he should be okay to travel.

## 2022-12-01 NOTE — Telephone Encounter (Signed)
Had IV Feraheme on 11/27/22. Still feels weak and is "dragging". Asking if a transfusion would help him feel better? Leaving 7/31 for [place] for family reunion and wants to feel better. He reports some mild dyspnea that he attributes to A. Fib. Does feel lightheaded at times.

## 2022-12-01 NOTE — Telephone Encounter (Signed)
Per Dr. Truett Perna: Would be reasonable to give 1 unit to see if this helps. Orders placed and blood bank notified and [facility] to pick up at 0730. Mr. Kathe Mariner is aware.

## 2022-12-01 NOTE — Telephone Encounter (Signed)
Pt would like a callback regarding whether it is okay for him to travel to [place] do to his recent Afib episodes. Please advise

## 2022-12-02 ENCOUNTER — Inpatient Hospital Stay: Payer: Medicare Other

## 2022-12-02 DIAGNOSIS — N189 Chronic kidney disease, unspecified: Secondary | ICD-10-CM | POA: Diagnosis not present

## 2022-12-02 DIAGNOSIS — Z5112 Encounter for antineoplastic immunotherapy: Secondary | ICD-10-CM | POA: Diagnosis not present

## 2022-12-02 DIAGNOSIS — I129 Hypertensive chronic kidney disease with stage 1 through stage 4 chronic kidney disease, or unspecified chronic kidney disease: Secondary | ICD-10-CM | POA: Diagnosis not present

## 2022-12-02 DIAGNOSIS — D649 Anemia, unspecified: Secondary | ICD-10-CM | POA: Diagnosis not present

## 2022-12-02 DIAGNOSIS — D6959 Other secondary thrombocytopenia: Secondary | ICD-10-CM | POA: Diagnosis not present

## 2022-12-02 DIAGNOSIS — C187 Malignant neoplasm of sigmoid colon: Secondary | ICD-10-CM | POA: Diagnosis not present

## 2022-12-02 MED ORDER — HEPARIN SOD (PORK) LOCK FLUSH 100 UNIT/ML IV SOLN
500.0000 [IU] | Freq: Every day | INTRAVENOUS | Status: AC | PRN
  Administered 2022-12-02: 500 [IU]

## 2022-12-02 MED ORDER — SODIUM CHLORIDE 0.9% FLUSH
10.0000 mL | INTRAVENOUS | Status: AC | PRN
  Administered 2022-12-02: 10 mL

## 2022-12-02 MED ORDER — SODIUM CHLORIDE 0.9% IV SOLUTION
250.0000 mL | Freq: Once | INTRAVENOUS | Status: AC
  Administered 2022-12-02: 250 mL via INTRAVENOUS

## 2022-12-02 NOTE — Patient Instructions (Signed)

## 2022-12-10 ENCOUNTER — Inpatient Hospital Stay: Payer: Medicare Other | Attending: Oncology

## 2022-12-10 ENCOUNTER — Other Ambulatory Visit: Payer: Self-pay

## 2022-12-10 VITALS — BP 139/61 | HR 63 | Temp 98.2°F | Resp 18 | Wt 214.6 lb

## 2022-12-10 DIAGNOSIS — Z5112 Encounter for antineoplastic immunotherapy: Secondary | ICD-10-CM | POA: Diagnosis not present

## 2022-12-10 DIAGNOSIS — C774 Secondary and unspecified malignant neoplasm of inguinal and lower limb lymph nodes: Secondary | ICD-10-CM | POA: Insufficient documentation

## 2022-12-10 DIAGNOSIS — G620 Drug-induced polyneuropathy: Secondary | ICD-10-CM | POA: Diagnosis not present

## 2022-12-10 DIAGNOSIS — C187 Malignant neoplasm of sigmoid colon: Secondary | ICD-10-CM | POA: Insufficient documentation

## 2022-12-10 DIAGNOSIS — I4892 Unspecified atrial flutter: Secondary | ICD-10-CM | POA: Insufficient documentation

## 2022-12-10 DIAGNOSIS — D6959 Other secondary thrombocytopenia: Secondary | ICD-10-CM | POA: Insufficient documentation

## 2022-12-10 DIAGNOSIS — D649 Anemia, unspecified: Secondary | ICD-10-CM | POA: Insufficient documentation

## 2022-12-10 DIAGNOSIS — I129 Hypertensive chronic kidney disease with stage 1 through stage 4 chronic kidney disease, or unspecified chronic kidney disease: Secondary | ICD-10-CM | POA: Insufficient documentation

## 2022-12-10 DIAGNOSIS — N189 Chronic kidney disease, unspecified: Secondary | ICD-10-CM | POA: Diagnosis not present

## 2022-12-10 DIAGNOSIS — Z95828 Presence of other vascular implants and grafts: Secondary | ICD-10-CM

## 2022-12-10 MED ORDER — SODIUM CHLORIDE 0.9 % IV SOLN
510.0000 mg | Freq: Once | INTRAVENOUS | Status: AC
  Administered 2022-12-10: 510 mg via INTRAVENOUS
  Filled 2022-12-10: qty 17

## 2022-12-10 MED ORDER — HEPARIN SOD (PORK) LOCK FLUSH 100 UNIT/ML IV SOLN
500.0000 [IU] | Freq: Once | INTRAVENOUS | Status: AC | PRN
  Administered 2022-12-10: 500 [IU] via INTRAVENOUS

## 2022-12-10 MED ORDER — SODIUM CHLORIDE 0.9 % IV SOLN: Freq: Once | INTRAVENOUS | Status: AC

## 2022-12-10 MED ORDER — SODIUM CHLORIDE 0.9% FLUSH
10.0000 mL | INTRAVENOUS | Status: DC | PRN
  Administered 2022-12-10: 10 mL via INTRAVENOUS

## 2022-12-10 NOTE — Patient Instructions (Addendum)
Ferumoxytol Injection What is this medication? FERUMOXYTOL (FER ue MOX i tol) treats low levels of iron in your body (iron deficiency anemia). Iron is a mineral that plays an important role in making red blood cells, which carry oxygen from your lungs to the rest of your body. This medicine may be used for other purposes; ask your health care provider or pharmacist if you have questions. COMMON BRAND NAME(S): Feraheme What should I tell my care team before I take this medication? They need to know if you have any of these conditions: Anemia not caused by low iron levels High levels of iron in the blood Magnetic resonance imaging (MRI) test scheduled An unusual or allergic reaction to iron, other medications, foods, dyes, or preservatives Pregnant or trying to get pregnant Breastfeeding How should I use this medication? This medication is injected into a vein. It is given by your care team in a hospital or clinic setting. Talk to your care team the use of this medication in children. Special care may be needed. Overdosage: If you think you have taken too much of this medicine contact a poison control center or emergency room at once. NOTE: This medicine is only for you. Do not share this medicine with others. What if I miss a dose? It is important not to miss your dose. Call your care team if you are unable to keep an appointment. What may interact with this medication? Other iron products This list may not describe all possible interactions. Give your health care provider a list of all the medicines, herbs, non-prescription drugs, or dietary supplements you use. Also tell them if you smoke, drink alcohol, or use illegal drugs. Some items may interact with your medicine. What should I watch for while using this medication? Visit your care team regularly. Tell your care team if your symptoms do not start to get better or if they get worse. You may need blood work done while you are taking this  medication. You may need to follow a special diet. Talk to your care team. Foods that contain iron include: whole grains/cereals, dried fruits, beans, or peas, leafy green vegetables, and organ meats (liver, kidney). What side effects may I notice from receiving this medication? Side effects that you should report to your care team as soon as possible: Allergic reactions--skin rash, itching, hives, swelling of the face, lips, tongue, or throat Low blood pressure--dizziness, feeling faint or lightheaded, blurry vision Shortness of breath Side effects that usually do not require medical attention (report to your care team if they continue or are bothersome): Flushing Headache Joint pain Muscle pain Nausea Pain, redness, or irritation at injection site This list may not describe all possible side effects. Call your doctor for medical advice about side effects. You may report side effects to FDA at 1-800-FDA-1088. Where should I keep my medication? This medication is given in a hospital or clinic. It will not be stored at home. NOTE: This sheet is a summary. It may not cover all possible information. If you have questions about this medicine, talk to your doctor, pharmacist, or health care provider.  2024 Elsevier/Gold Standard (2022-09-26 00:00:00)

## 2022-12-15 ENCOUNTER — Other Ambulatory Visit: Payer: Self-pay | Admitting: Nurse Practitioner

## 2022-12-15 DIAGNOSIS — D649 Anemia, unspecified: Secondary | ICD-10-CM

## 2022-12-15 DIAGNOSIS — C189 Malignant neoplasm of colon, unspecified: Secondary | ICD-10-CM

## 2022-12-17 ENCOUNTER — Other Ambulatory Visit: Payer: Medicare Other

## 2022-12-17 ENCOUNTER — Ambulatory Visit: Payer: Medicare Other

## 2022-12-17 ENCOUNTER — Ambulatory Visit: Payer: Medicare Other | Admitting: Nurse Practitioner

## 2022-12-24 ENCOUNTER — Inpatient Hospital Stay: Payer: Medicare Other

## 2022-12-24 ENCOUNTER — Inpatient Hospital Stay (HOSPITAL_BASED_OUTPATIENT_CLINIC_OR_DEPARTMENT_OTHER): Payer: Medicare Other | Admitting: Nurse Practitioner

## 2022-12-24 ENCOUNTER — Encounter: Payer: Self-pay | Admitting: Nurse Practitioner

## 2022-12-24 VITALS — BP 119/58 | HR 62

## 2022-12-24 VITALS — BP 138/63 | HR 95 | Temp 98.4°F | Resp 18 | Ht 76.0 in | Wt 219.4 lb

## 2022-12-24 DIAGNOSIS — C187 Malignant neoplasm of sigmoid colon: Secondary | ICD-10-CM | POA: Diagnosis not present

## 2022-12-24 DIAGNOSIS — I129 Hypertensive chronic kidney disease with stage 1 through stage 4 chronic kidney disease, or unspecified chronic kidney disease: Secondary | ICD-10-CM | POA: Diagnosis not present

## 2022-12-24 DIAGNOSIS — C189 Malignant neoplasm of colon, unspecified: Secondary | ICD-10-CM

## 2022-12-24 DIAGNOSIS — C774 Secondary and unspecified malignant neoplasm of inguinal and lower limb lymph nodes: Secondary | ICD-10-CM | POA: Diagnosis not present

## 2022-12-24 DIAGNOSIS — D6959 Other secondary thrombocytopenia: Secondary | ICD-10-CM | POA: Diagnosis not present

## 2022-12-24 DIAGNOSIS — Z5112 Encounter for antineoplastic immunotherapy: Secondary | ICD-10-CM | POA: Diagnosis not present

## 2022-12-24 DIAGNOSIS — N189 Chronic kidney disease, unspecified: Secondary | ICD-10-CM | POA: Diagnosis not present

## 2022-12-24 LAB — CEA (ACCESS): CEA (CHCC): 3.54 ng/mL (ref 0.00–5.00)

## 2022-12-24 LAB — CBC WITH DIFFERENTIAL (CANCER CENTER ONLY)
Abs Immature Granulocytes: 0.02 10*3/uL (ref 0.00–0.07)
Basophils Absolute: 0 10*3/uL (ref 0.0–0.1)
Basophils Relative: 0 %
Eosinophils Absolute: 0.2 10*3/uL (ref 0.0–0.5)
Eosinophils Relative: 3 %
HCT: 30.8 % — ABNORMAL LOW (ref 39.0–52.0)
Hemoglobin: 9.6 g/dL — ABNORMAL LOW (ref 13.0–17.0)
Immature Granulocytes: 0 %
Lymphocytes Relative: 11 %
Lymphs Abs: 0.7 10*3/uL (ref 0.7–4.0)
MCH: 27.7 pg (ref 26.0–34.0)
MCHC: 31.2 g/dL (ref 30.0–36.0)
MCV: 88.8 fL (ref 80.0–100.0)
Monocytes Absolute: 0.9 10*3/uL (ref 0.1–1.0)
Monocytes Relative: 13 %
Neutro Abs: 5.1 10*3/uL (ref 1.7–7.7)
Neutrophils Relative %: 73 %
Platelet Count: 150 10*3/uL (ref 150–400)
RBC: 3.47 MIL/uL — ABNORMAL LOW (ref 4.22–5.81)
RDW: 19.4 % — ABNORMAL HIGH (ref 11.5–15.5)
WBC Count: 7 10*3/uL (ref 4.0–10.5)
nRBC: 0 % (ref 0.0–0.2)

## 2022-12-24 LAB — CMP (CANCER CENTER ONLY)
ALT: 6 U/L (ref 0–44)
AST: 7 U/L — ABNORMAL LOW (ref 15–41)
Albumin: 3.3 g/dL — ABNORMAL LOW (ref 3.5–5.0)
Alkaline Phosphatase: 86 U/L (ref 38–126)
Anion gap: 7 (ref 5–15)
BUN: 31 mg/dL — ABNORMAL HIGH (ref 8–23)
CO2: 20 mmol/L — ABNORMAL LOW (ref 22–32)
Calcium: 7.9 mg/dL — ABNORMAL LOW (ref 8.9–10.3)
Chloride: 112 mmol/L — ABNORMAL HIGH (ref 98–111)
Creatinine: 2.27 mg/dL — ABNORMAL HIGH (ref 0.61–1.24)
GFR, Estimated: 29 mL/min — ABNORMAL LOW (ref >60)
Glucose, Bld: 132 mg/dL — ABNORMAL HIGH (ref 70–99)
Potassium: 4.5 mmol/L (ref 3.5–5.1)
Sodium: 139 mmol/L (ref 135–145)
Total Bilirubin: 0.4 mg/dL (ref 0.3–1.2)
Total Protein: 6.4 g/dL — ABNORMAL LOW (ref 6.5–8.1)

## 2022-12-24 LAB — MAGNESIUM: Magnesium: 1.9 mg/dL (ref 1.7–2.4)

## 2022-12-24 MED ORDER — SODIUM CHLORIDE 0.9% FLUSH
10.0000 mL | INTRAVENOUS | Status: DC | PRN
  Administered 2022-12-24: 10 mL

## 2022-12-24 MED ORDER — SODIUM CHLORIDE 0.9 % IV SOLN: Freq: Once | INTRAVENOUS | Status: AC

## 2022-12-24 MED ORDER — HEPARIN SOD (PORK) LOCK FLUSH 100 UNIT/ML IV SOLN
500.0000 [IU] | Freq: Once | INTRAVENOUS | Status: AC | PRN
  Administered 2022-12-24: 500 [IU]

## 2022-12-24 MED ORDER — SODIUM CHLORIDE 0.9 % IV SOLN
4.0000 mg/kg | Freq: Once | INTRAVENOUS | Status: AC
  Administered 2022-12-24: 400 mg via INTRAVENOUS
  Filled 2022-12-24: qty 20

## 2022-12-24 NOTE — Progress Notes (Signed)
Patient seen by Lisa Thomas NP today  Vitals are within treatment parameters.  Labs reviewed by Lisa Thomas NP and are within treatment parameters.  Per physician team, patient is ready for treatment and there are NO modifications to the treatment plan.     

## 2022-12-24 NOTE — Patient Instructions (Signed)
[facility]   Discharge Instructions: Thank you for choosing [facility] to provide your oncology and hematology care.   If you have a lab appointment with the Cancer Center, please go directly to the Cancer Center and check in at the registration area.   Wear comfortable clothing and clothing appropriate for easy access to any Portacath or PICC line.   We strive to give you quality time with your provider. You may need to reschedule your appointment if you arrive late (15 or more minutes).  Arriving late affects you and other patients whose appointments are after yours.  Also, if you miss three or more appointments without notifying the office, you may be dismissed from the clinic at the provider's discretion.      For prescription refill requests, have your pharmacy contact our office and allow 72 hours for refills to be completed.    Today you received the following chemotherapy and/or immunotherapy agents Vectibix      To help prevent nausea and vomiting after your treatment, we encourage you to take your nausea medication as directed.  BELOW ARE SYMPTOMS THAT SHOULD BE REPORTED IMMEDIATELY: *FEVER GREATER THAN 100.4 F (38 C) OR HIGHER *CHILLS OR SWEATING *NAUSEA AND VOMITING THAT IS NOT CONTROLLED WITH YOUR NAUSEA MEDICATION *UNUSUAL SHORTNESS OF BREATH *UNUSUAL BRUISING OR BLEEDING *URINARY PROBLEMS (pain or burning when urinating, or frequent urination) *BOWEL PROBLEMS (unusual diarrhea, constipation, pain near the anus) TENDERNESS IN MOUTH AND THROAT WITH OR WITHOUT PRESENCE OF ULCERS (sore throat, sores in mouth, or a toothache) UNUSUAL RASH, SWELLING OR PAIN  UNUSUAL VAGINAL DISCHARGE OR ITCHING   Items with * indicate a potential emergency and should be followed up as soon as possible or go to the Emergency Department if any problems should occur.  Please show the CHEMOTHERAPY ALERT CARD or IMMUNOTHERAPY ALERT CARD at  check-in to the Emergency Department and triage nurse.  Should you have questions after your visit or need to cancel or reschedule your appointment, please contact [facility]  Dept: [phone]  and follow the prompts.  Office hours are 8:00 a.m. to 4:30 p.m. Monday - Friday. Please note that voicemails left after 4:00 p.m. may not be returned until the following business day.  We are closed weekends and major holidays. You have access to a nurse at all times for urgent questions. Please call the main number to the clinic Dept: [phone] and follow the prompts.   For any non-urgent questions, you may also contact your provider using MyChart. We now offer e-Visits for anyone 42 and older to request care online for non-urgent symptoms. For details visit [email or web address].   Also download the MyChart app! Go to the app store, search "MyChart", open the app, select [facility], and log in with your MyChart username and password.  Panitumumab Injection What is this medication? PANITUMUMAB (pan i TOOM ue mab) treats colorectal cancer. It works by blocking a protein that causes cancer cells to grow and multiply. This helps to slow or stop the spread of cancer cells. It is a monoclonal antibody. This medicine may be used for other purposes; ask your health care provider or pharmacist if you have questions. COMMON BRAND NAME(S): Vectibix What should I tell my care team before I take this medication? They need to know if you have any of these conditions: Eye disease Low levels of magnesium in the blood Lung disease An unusual or allergic reaction  to panitumumab, other medications, foods, dyes, or preservatives Pregnant or trying to get pregnant Breast-feeding How should I use this medication? This medication is injected into a vein. It is given by your care team in a hospital or clinic setting. Talk to your care team about the use of this medication in  children. Special care may be needed. Overdosage: If you think you have taken too much of this medicine contact a poison control center or emergency room at once. NOTE: This medicine is only for you. Do not share this medicine with others. What if I miss a dose? Keep appointments for follow-up doses. It is important not to miss your dose. Call your care team if you are unable to keep an appointment. What may interact with this medication? Bevacizumab This list may not describe all possible interactions. Give your health care provider a list of all the medicines, herbs, non-prescription drugs, or dietary supplements you use. Also tell them if you smoke, drink alcohol, or use illegal drugs. Some items may interact with your medicine. What should I watch for while using this medication? Your condition will be monitored carefully while you are receiving this medication. This medication may make you feel generally unwell. This is not uncommon as chemotherapy can affect healthy cells as well as cancer cells. Report any side effects. Continue your course of treatment even though you feel ill unless your care team tells you to stop. This medication can make you more sensitive to the sun. Keep out of the sun while receiving this medication and for 2 months after stopping therapy. If you cannot avoid being in the sun, wear protective clothing and sunscreen. Do not use sun lamps, tanning beds, or tanning booths. Check with your care team if you have severe diarrhea, nausea, and vomiting or if you sweat a lot. The loss of too much body fluid may make it dangerous for you to take this medication. This medication may cause serious skin reactions. They can happen weeks to months after starting the medication. Contact your care team right away if you notice fevers or flu-like symptoms with a rash. The rash may be red or purple and then turn into blisters or peeling of the skin. You may also notice a red rash with  swelling of the face, lips, or lymph nodes in your neck or under your arms. Talk to your care team if you may be pregnant. Serious birth defects can occur if you take this medication during pregnancy and for 2 months after the last dose. Contraception is recommended while taking this medication and for 2 months after the last dose. Your care team can help you find the option that works for you. Do not breastfeed while taking this medication and for 2 months after the last dose. This medication may cause infertility. Talk to your care team if you are concerned about your fertility. What side effects may I notice from receiving this medication? Side effects that you should report to your care team as soon as possible: Allergic reactions--skin rash, itching, hives, swelling of the face, lips, tongue, or throat Dry cough, shortness of breath or trouble breathing Eye pain, redness, irritation, or discharge with blurry or decreased vision Infusion reactions--chest pain, shortness of breath or trouble breathing, feeling faint or lightheaded Low magnesium level--muscle pain or cramps, unusual weakness or fatigue, fast or irregular heartbeat, tremors Low potassium level--muscle pain or cramps, unusual weakness or fatigue, fast or irregular heartbeat, constipation Redness, blistering, peeling, or loosening of   the skin, including inside the mouth Skin reactions on sun-exposed areas Side effects that usually do not require medical attention (report to your care team if they continue or are bothersome): Change in nail shape, thickness, or color Diarrhea Dry skin Fatigue Nausea Vomiting This list may not describe all possible side effects. Call your doctor for medical advice about side effects. You may report side effects to FDA at 1-800-FDA-1088. Where should I keep my medication? This medication is given in a hospital or clinic. It will not be stored at home. NOTE: This sheet is a summary. It may not  cover all possible information. If you have questions about this medicine, talk to your doctor, pharmacist, or health care provider.  2024 Elsevier/Gold Standard (2021-09-04 00:00:00)

## 2022-12-24 NOTE — Progress Notes (Signed)
[facility] OFFICE PROGRESS NOTE   Diagnosis: Colon cancer  INTERVAL HISTORY:   Mr. Kathe Mariner returns as scheduled.  He continues encorafenib.  He was last treated with Panitumumab 11/25/2022.  Panitumumab dose was reduced with that treatment due to a skin rash.  He received Feraheme 11/27/2022 and 12/10/2022.  He was transfused a unit of blood 12/02/2022 for hemoglobin 8.7.  Energy level remains poor.  Skin rash is better, now "tolerable".  No nausea or vomiting.  No mouth sores.  Occasional loose stool.  No bleeding.  No nailbed issues.  Objective:  Vital signs in last 24 hours:  Blood pressure 138/63, pulse 95, temperature 98.4 F (36.9 C), temperature source Oral, resp. rate 18, height 6\' 4"  (1.93 m), weight 219 lb 6.4 oz (99.5 kg), SpO2 100%.    HEENT: No thrush or ulcers. Lymphatics: Approximate 1 cm left axillary lymph node.  Approximate 4 cm left inguinal node. Resp: Lungs clear bilaterally. Cardio: Regular rate and rhythm. GI: No hepatosplenomegaly. Vascular: Trace lower leg edema bilaterally. Skin: Acne type rash scattered over the face, chest. Port-A-Cath without erythema.  Lab Results:  Lab Results  Component Value Date   WBC 7.0 12/24/2022   HGB 9.6 (L) 12/24/2022   HCT 30.8 (L) 12/24/2022   MCV 88.8 12/24/2022   PLT 150 12/24/2022   NEUTROABS 5.1 12/24/2022    Imaging:  No results found.  Medications: I have reviewed the patient's current medications.  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), isolated mesenteric implant-resected, multiple tumor deposits Sigmoid/descending resection and creation of a descending colostomy 04/10/2016 MSI-stable, no loss of mismatch repair protein expression Foundation 1- BRAF V600E positive, MS-stable, intermediate tumor mutation burden, no RAS mutation CT abdomen/pelvis 04/02/2016-no evidence of distant metastatic disease Cycle 1 FOLFOX 05/21/2016 Cycle 2 FOLFOX 06/04/2016 Cycle 3 FOLFOX  06/18/2016 Cycle 4 FOLFOX 07/02/2016 (oxaliplatin held secondary to thrombocytopenia) Cycle 5 FOLFOX 07/16/2016 Cycle 6 FOLFOX 07/30/2016 Cycle 7 FOLFOX 08/13/2016 (oxaliplatin held and 5-FU dose reduced) Cycle 8 FOLFOX 09/03/2016 (oxaliplatin held secondary to neuropathy) Cycle 9 FOLFOX 09/17/2016 (oxaliplatin held secondary to neuropathy) Cycle 10 FOLFOX 10/02/2016 Cycle 11 FOLFOX 10/15/2016 (oxaliplatin eliminated from the regimen) Cycle 12 FOLFOX 10/30/2016 (oxaliplatin eliminated) CTs 05/12/2017-no evidence of recurrent disease, right inguinal hernia containing bladder Colonoscopy 07/21/2017, 3 polyps were removed from the descending and transverse colon, fragments of tubular and tubulovillous adenoma CTs 05/19/2018-no evidence of recurrent disease, status post hernia repair, right upper lobe pneumonia CTs 05/20/2019-resolution of right upper lobe pneumonia, no evidence of metastatic disease Colonoscopy 01/25/2020-multiple polyps removed-tubular adenomas, poor preparation, repeat colonoscopy recommended CTs neck, chest, and abdomen/pelvis 08/27/2020- new 2 centimeter left axillary node, new 1.9 cm left inguinal node chronic mildly prominent portacaval node Ultrasound biopsy of left inguinal node on 10/29/2020-metastatic adenocarcinoma consistent with colon adenocarcinoma PET 11/12/2020-enlarged hypermetabolic left inguinal and left axillary nodes, solitary focus of hypermetabolic activity in the right lower quadrant small bowel Cycle 1 FOLFOX 12/19/2020 Cycle 2 FOLFOX 01/02/2021, oxaliplatin dose reduced secondary to neutropenia and thrombocytopenia Cycle 3 FOLFOX 01/16/2021 Cycle 4 FOLFOX 01/30/2021 CTs 02/11/2021-no change in left inguinal and left axillary nodes, no evidence of disease progression, stable wall thickening involving a loop of distal ileum Cycle 1 FOLFIRI/Avastin 02/27/2021 Cycle 2 FOLFIRI/Avastin 03/13/2021 Cycle 3 FOLFIRI/Avastin 04/03/2021 Cycle 4 FOLFIRI 04/17/2021, Avastin held  due to elevated urine protein 05/07/2021-24-hour urine protein elevated 1562 Cycle 5 FOLFIRI 05/08/2021, Avastin held due to elevated urine protein, irinotecan dose reduced due to in general not feeling well after  treatment CTs 05/17/2021-left axillary and left inguinal lymph nodes are smaller, no evidence of disease progression Cycle 6 FOLFIRI 05/22/2021, Avastin held due to proteinuria 06/03/2021 24-hour urine with 1.9 g of protein Cycle 7 FOLFIRI 06/12/2021, Avastin held due to proteinuria Cycle 8 FOLFIRI 07/03/2021, Avastin held due to proteinuria Cycle 9 FOLFIRI 07/24/2021, Avastin held due to proteinuria Cycle 10 FOLFIRI 08/19/2021, Avastin held due to proteinuria CTs 09/02/2021-no change in the left axillary, left inguinal, and portacaval lymph nodes.  No mention of the distal small bowel thickening Maintenance 5-fluorouracil 09/18/2021 CT abdomen/pelvis 10/28/2021-enlarged left inguinal lymph node, stable; stomach and small bowel grossly unremarkable. Maintenance 5-fluorouracil continued Colonoscopy 12/10/2021-terminal ileum with an ulcerated partially obstructing medium-sized mass approximately 15 to 20 cm from the IC valve, invasive adenocarcinoma, colonic type, moderately differentiated (grade 2) PET scan 12/18/2021-progression of disease in the peritoneal space.  2 intensely hypermetabolic nodes in the left axilla, one node is new from the prior, the other is reduced in size and metabolic activity; decrease in size and metabolic activity of left inguinal adenopathy; intense metabolic activity associate with a loop of small bowel in the right lower quadrant, no interval change; new small hypermetabolic peritoneal implant along the ventral peritoneal surface just right of midline; larger new peritoneal implant in the deep right pelvis; small new implant in the right mid abdomen Encorafenib/Panitumumab 01/08/2022 CTs 03/17/2022-decrease size of mesenteric implant at the posterior bladder wall, small mesenteric  peritoneal lesions identified on PET/CT or not evident.  Decrease size of left inguinal left axillary nodes, hypermetabolic activity noted at the anterior left prostate on comparison August 2023 PET CTs 07/21/2022-decrease size of a left axillary lymph node and right pelvic implant.  Left inguinal lymph node measures smaller.  No new sites of disease identified.  Similar tiny pulmonary nodules. Encorafenib and Panitumumab continued, Panitumumab changed from a 2-week schedule to a 3-week schedule 07/23/2022 CT 10/30/2022-stable left axillary lymph node, no new or progressive metastatic disease in the chest; stable left inguinal lymphadenopathy; decrease in size of deep right peritoneal implant; no new metastatic disease in the abdomen/pelvis. encorafenib and Panitumumab continued Panitumumab dose reduced 11/25/2022 due to skin rash   2.   Chronic renal insufficiency   3.   Hypertension   4.  Inflamed sebaceous cyst at the upper back-status post incision and drainage   5.  Thrombocytopenia secondary chemotherapy-oxaliplatin dose reduced beginning with cycle 3 FOLFOX, oxaliplatin held with cycle 4 and cycle 7 FOLFOX   6.  Oxaliplatin neuropathy-improved   7.  History of Mucositis secondary chemotherapy   8.  Symptoms of pneumonia January 2020-CT 05/19/2018 consistent with right upper lobe pneumonia, Levaquin prescribed 05/20/2018   9.  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  Anemia, progressive 10/29/2021 2 units packed red blood cells 10/31/2021 Ferritin low 10/29/2021-oral iron Ferritin low 01/22/2022   12.  Colonoscopy 12/10/2021-terminal ileum with an ulcerated partially obstructing medium-sized mass approximately 15 to 20 cm from the IC valve, invasive adenocarcinoma, colonic type, moderately differentiated (grade 2) 13.  Admission 09/02/2022 with atrial flutter and rapid ventricular response  Disposition: Louis [name] appears stable.  He  continues active treatment with Encorafenib and every 3-week Panitumumab.  The Panitumumab was dose reduced with the last treatment due to worsening skin rash.  The rash is better.  Plan to proceed with Panitumumab at the reduced dose today as scheduled.  CBC and chemistry panel reviewed.  Labs adequate to proceed  as above.  Creatinine is stable at 2.27.  Magnesium is in normal range.  He will continue oral magnesium.  He will return for follow-up and treatment in 3 weeks.  We are available to see him sooner if needed.    Lonna Cobb ANP/GNP-BC   12/24/2022  10:27 AM

## 2023-01-01 ENCOUNTER — Other Ambulatory Visit: Payer: Self-pay | Admitting: *Deleted

## 2023-01-01 MED ORDER — ENCORAFENIB 75 MG PO CAPS: 300.0000 mg | ORAL_CAPSULE | Freq: Every day | ORAL | 1 refills | Status: DC

## 2023-01-02 ENCOUNTER — Telehealth: Payer: Self-pay | Admitting: *Deleted

## 2023-01-02 ENCOUNTER — Ambulatory Visit (HOSPITAL_BASED_OUTPATIENT_CLINIC_OR_DEPARTMENT_OTHER): Payer: Medicare Other | Admitting: Cardiovascular Disease

## 2023-01-02 NOTE — Telephone Encounter (Signed)
Called to request nurse return call. He wants his medical records sent somewhere and he will have contact information on return call. This RN called and left VM that will call again later in afternoon.

## 2023-01-02 NOTE — Telephone Encounter (Addendum)
[name] has self-referred himself back to [facility] for a mid-September appointment. Asking for records to be sent.  Records faxed as requested.

## 2023-01-08 ENCOUNTER — Encounter: Payer: Self-pay | Admitting: Dermatology

## 2023-01-08 ENCOUNTER — Ambulatory Visit (INDEPENDENT_AMBULATORY_CARE_PROVIDER_SITE_OTHER): Payer: Medicare Other | Admitting: Dermatology

## 2023-01-08 VITALS — BP 138/60 | HR 74

## 2023-01-08 DIAGNOSIS — W908XXA Exposure to other nonionizing radiation, initial encounter: Secondary | ICD-10-CM

## 2023-01-08 DIAGNOSIS — C19 Malignant neoplasm of rectosigmoid junction: Secondary | ICD-10-CM | POA: Diagnosis not present

## 2023-01-08 DIAGNOSIS — D225 Melanocytic nevi of trunk: Secondary | ICD-10-CM

## 2023-01-08 DIAGNOSIS — L821 Other seborrheic keratosis: Secondary | ICD-10-CM | POA: Diagnosis not present

## 2023-01-08 DIAGNOSIS — L578 Other skin changes due to chronic exposure to nonionizing radiation: Secondary | ICD-10-CM | POA: Diagnosis not present

## 2023-01-08 DIAGNOSIS — D1801 Hemangioma of skin and subcutaneous tissue: Secondary | ICD-10-CM

## 2023-01-08 DIAGNOSIS — L83 Acanthosis nigricans: Secondary | ICD-10-CM | POA: Diagnosis not present

## 2023-01-08 DIAGNOSIS — B079 Viral wart, unspecified: Secondary | ICD-10-CM

## 2023-01-08 DIAGNOSIS — L720 Epidermal cyst: Secondary | ICD-10-CM | POA: Diagnosis not present

## 2023-01-08 DIAGNOSIS — L570 Actinic keratosis: Secondary | ICD-10-CM

## 2023-01-08 DIAGNOSIS — L814 Other melanin hyperpigmentation: Secondary | ICD-10-CM | POA: Diagnosis not present

## 2023-01-08 DIAGNOSIS — C44712 Basal cell carcinoma of skin of right lower limb, including hip: Secondary | ICD-10-CM

## 2023-01-08 DIAGNOSIS — Z1283 Encounter for screening for malignant neoplasm of skin: Secondary | ICD-10-CM

## 2023-01-08 DIAGNOSIS — C4491 Basal cell carcinoma of skin, unspecified: Secondary | ICD-10-CM

## 2023-01-08 DIAGNOSIS — Z7969 Long term (current) use of other immunomodulators and immunosuppressants: Secondary | ICD-10-CM

## 2023-01-08 DIAGNOSIS — Z5112 Encounter for antineoplastic immunotherapy: Secondary | ICD-10-CM

## 2023-01-08 DIAGNOSIS — D485 Neoplasm of uncertain behavior of skin: Secondary | ICD-10-CM

## 2023-01-08 HISTORY — DX: Basal cell carcinoma of skin, unspecified: C44.91

## 2023-01-08 NOTE — Progress Notes (Signed)
New Patient Visit   Subjective  Louis [name]. is a 77 y.o. male who presents for the following: Skin Cancer Screening and Full Body Skin Exam  The patient presents for Total-Body Skin Exam (TBSE) for skin cancer screening and mole check. The patient has spots, moles and lesions to be evaluated, some may be new or changing and the patient may have concern these could be cancer. Pt last had a skin check around 5 years ago  Patient is currently undergoing treatment for colorectal cancer with BRAF inhibitor and EGFR inhibitor. Patient is here due to primary team wanting him to be looked at closely due to increased risk of skin lesions.  The following portions of the chart were reviewed this encounter and updated as appropriate: medications, allergies, medical history  Review of Systems:  No other skin or systemic complaints except as noted in HPI or Assessment and Plan.  Objective  Well appearing patient in no apparent distress; mood and affect are within normal limits.  A full examination was performed including scalp, head, eyes, ears, nose, lips, neck, chest, axillae, abdomen, back, buttocks, bilateral upper extremities, bilateral lower extremities, hands, feet, fingers, toes, fingernails, and toenails. All findings within normal limits unless otherwise noted below.   Relevant physical exam findings are noted in the Assessment and Plan.  Right Lower Leg Crusted plaque       Right Forehead Pink papule       Left Cheek Pink papule        Assessment & Plan   SKIN CANCER SCREENING PERFORMED TODAY.  ACTINIC DAMAGE - Chronic condition, secondary to cumulative UV/sun exposure - diffuse scaly erythematous macules with underlying dyspigmentation - Recommend daily broad spectrum sunscreen SPF 30+ to sun-exposed areas, reapply every 2 hours as needed.  - Staying in the shade or wearing long sleeves, sun glasses (UVA+UVB protection) and wide brim hats (4-inch brim  around the entire circumference of the hat) are also recommended for sun protection.  - Call for new or changing lesions.  Colorectal Cancer, on BRAF (encorafenib) and EGFR inhibitor (panitumumab)- Chronic condition with acute flare - Discussed increased risk of Keratoacanthomas and other skin lesions due to therapy - Patient already taking daily doxycycline due to EGFR induced rashes - Recommend q57month TBSEs  MELANOCYTIC NEVI - Tan-brown and/or pink-flesh-colored symmetric macules and papules - Benign appearing on exam today - Observation - Call clinic for new or changing moles - Recommend daily use of broad spectrum spf 30+ sunscreen to sun-exposed areas.   LENTIGINES Exam: scattered tan macules Due to sun exposure Treatment Plan: Benign-appearing, observe. Recommend daily broad spectrum sunscreen SPF 30+ to sun-exposed areas, reapply every 2 hours as needed.  Call for any changes   SEBORRHEIC KERATOSIS - Stuck-on, waxy, tan-brown papules and/or plaques  - Benign-appearing - Discussed benign etiology and prognosis. - Observe - Call for any changes  CHERRY ANGIOMA Exam: red papule(s) Discussed benign nature. Recommend observation. Call for changes.    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  Acanthosis nigricans (AN) is presents as symmetric brown, velvety plaques that involve primarily the skin folds such as the axillae, posterior and lateral neck folds.  Initially only hyperpigmentation is noted, however thickening and accentuation of the skin markings follows.   Patient is not interested in treatment at this time  Neoplasm of uncertain behavior of skin (3) Right Lower Leg  Skin / nail biopsy  Type of biopsy: tangential   Timeout: patient name, date of birth, surgical site, and procedure verified   Procedure prep:  Patient was prepped and draped in usual sterile  fashion Instrument used: DermaBlade   Post-procedure details: wound care instructions given    Specimen 1 - Surgical pathology Differential Diagnosis: R/O NMSC  Check Margins: No  Right Forehead  Skin / nail biopsy Type of biopsy: tangential   Timeout: patient name, date of birth, surgical site, and procedure verified   Instrument used: DermaBlade   Post-procedure details: wound care instructions given    Specimen 2 - Surgical pathology Differential Diagnosis: R/O NMSC  Check Margins: No  Left Cheek  Skin / nail biopsy Type of biopsy: tangential   Timeout: patient name, date of birth, surgical site, and procedure verified   Instrument used: DermaBlade   Post-procedure details: wound care instructions given    Specimen 3 - Surgical pathology Differential Diagnosis: R/O NMSC  Check Margins: No  AK (actinic keratosis) (3) Right Ear left ear  Destruction of lesion - Right Ear left ear (3)  Destruction method: cryotherapy   Lesion destroyed using liquid nitrogen: Yes   Region frozen until ice ball extended beyond lesion: Yes    Colorectal cancer (HCC)  Acanthosis nigricans  Cherry angioma  Seborrheic keratoses  Lentigines  Melanocytic nevi of trunk  Actinic skin damage  Maintenance antineoplastic immunotherapy    Return in about 6 months (around 07/08/2023) for TBSE.  I, Tillie Fantasia, CMA, am acting as scribe for Gwenith Daily, MD.   Documentation: I have reviewed the above documentation for accuracy and completeness, and I agree with the above.  Gwenith Daily, MD

## 2023-01-08 NOTE — Patient Instructions (Addendum)
Cryotherapy Aftercare  Wash gently with soap and water everyday.   Apply Vaseline and Band-Aid daily until healed.   Patient Handout: Wound Care for Skin Biopsy Site  Taking Care of Your Skin Biopsy Site  Proper care of the biopsy site is essential for promoting healing and minimizing scarring. This handout provides instructions on how to care for your biopsy site to ensure optimal recovery.  1. Cleaning the Wound:  Clean the biopsy site daily with gentle soap and water. Gently pat the area dry with a clean, soft towel. Avoid harsh scrubbing or rubbing the area, as this can irritate the skin and delay healing.  2. Applying Aquaphor and Bandage:  After cleaning the wound, apply a thin layer of Aquaphor ointment to the biopsy site. Cover the area with a sterile bandage to protect it from dirt, bacteria, and friction. Change the bandage daily or as needed if it becomes soiled or wet.  3. Continued Care for One Week:  Repeat the cleaning, Aquaphor application, and bandaging process daily for one week following the biopsy procedure. Keeping the wound clean and moist during this initial healing period will help prevent infection and promote optimal healing.  4. Massaging Aquaphor into the Area:  ---After one week, discontinue the use of bandages but continue to apply Aquaphor to the biopsy site. ----Gently massage the Aquaphor into the area using circular motions. ---Massaging the skin helps to promote circulation and prevent the formation of scar tissue.   Additional Tips:  Avoid exposing the biopsy site to direct sunlight during the healing process, as this can cause hyperpigmentation or worsen scarring. If you experience any signs of infection, such as increased redness, swelling, warmth, or drainage from the wound, contact your healthcare provider immediately. Follow any additional instructions provided by your healthcare provider for caring for the biopsy site and managing any  discomfort. Conclusion:  Taking proper care of your skin biopsy site is crucial for ensuring optimal healing and minimizing scarring. By following these instructions for cleaning, applying Aquaphor, and massaging the area, you can promote a smooth and successful recovery. If you have any questions or concerns about caring for your biopsy site, don't hesitate to contact your healthcare provider for guidance.    Important Information  Due to recent changes in healthcare laws, you may see results of your pathology and/or laboratory studies on MyChart before the doctors have had a chance to review them. We understand that in some cases there may be results that are confusing or concerning to you. Please understand that not all results are received at the same time and often the doctors may need to interpret multiple results in order to provide you with the best plan of care or course of treatment. Therefore, we ask that you please give [place] 2 business days to thoroughly review all your results before contacting the office for clarification. Should we see a critical lab result, you will be contacted sooner.   If You Need Anything After Your Visit  If you have any questions or concerns for your doctor, please call our main line at [phone] If no one answers, please leave a voicemail as directed and we will return your call as soon as possible. Messages left after 4 pm will be answered the following business day.   You may also send [place] a message via MyChart. We typically respond to MyChart messages within 1-2 business days.  For prescription refills, please ask your pharmacy to contact our office. Our fax number  is [phone].  If you have an urgent issue when the clinic is closed that cannot wait until the next business day, you can page your doctor at the number below.    Please note that while we do our best to be available for urgent issues outside of office hours, we are not available 24/7.    If you have an urgent issue and are unable to reach [place], you may choose to seek medical care at your doctor's office, retail clinic, urgent care center, or emergency room.  If you have a medical emergency, please immediately call 911 or go to the emergency department. In the event of inclement weather, please call our main line at [phone] for an update on the status of any delays or closures.  Dermatology Medication Tips: Please keep the boxes that topical medications come in in order to help keep track of the instructions about where and how to use these. Pharmacies typically print the medication instructions only on the boxes and not directly on the medication tubes.   If your medication is too expensive, please contact our office at [phone] or send [place] a message through MyChart.   We are unable to tell what your co-pay for medications will be in advance as this is different depending on your insurance coverage. However, we may be able to find a substitute medication at lower cost or fill out paperwork to get insurance to cover a needed medication.   If a prior authorization is required to get your medication covered by your insurance company, please allow [place] 1-2 business days to complete this process.  Drug prices often vary depending on where the prescription is filled and some pharmacies may offer cheaper prices.  The website www.goodrx.com contains coupons for medications through different pharmacies. The prices here do not account for what the cost may be with help from insurance (it may be cheaper with your insurance), but the website can give you the price if you did not use any insurance.  - You can print the associated coupon and take it with your prescription to the pharmacy.  - You may also stop by our office during regular business hours and pick up a GoodRx coupon card.  - If you need your prescription sent electronically to a different pharmacy, notify our office  through [facility] or by phone at [phone]

## 2023-01-11 ENCOUNTER — Other Ambulatory Visit: Payer: Self-pay | Admitting: Oncology

## 2023-01-12 ENCOUNTER — Other Ambulatory Visit (HOSPITAL_COMMUNITY): Payer: Self-pay | Admitting: Internal Medicine

## 2023-01-12 DIAGNOSIS — E663 Overweight: Secondary | ICD-10-CM | POA: Diagnosis not present

## 2023-01-12 DIAGNOSIS — Z6826 Body mass index (BMI) 26.0-26.9, adult: Secondary | ICD-10-CM | POA: Diagnosis not present

## 2023-01-12 DIAGNOSIS — M2560 Stiffness of unspecified joint, not elsewhere classified: Secondary | ICD-10-CM | POA: Diagnosis not present

## 2023-01-12 DIAGNOSIS — M79642 Pain in left hand: Secondary | ICD-10-CM | POA: Diagnosis not present

## 2023-01-12 DIAGNOSIS — M79641 Pain in right hand: Secondary | ICD-10-CM | POA: Diagnosis not present

## 2023-01-12 DIAGNOSIS — M25561 Pain in right knee: Secondary | ICD-10-CM | POA: Diagnosis not present

## 2023-01-12 DIAGNOSIS — M1991 Primary osteoarthritis, unspecified site: Secondary | ICD-10-CM | POA: Diagnosis not present

## 2023-01-14 ENCOUNTER — Encounter: Payer: Self-pay | Admitting: *Deleted

## 2023-01-14 ENCOUNTER — Inpatient Hospital Stay: Payer: Medicare Other

## 2023-01-14 ENCOUNTER — Inpatient Hospital Stay: Payer: Medicare Other | Attending: Oncology

## 2023-01-14 ENCOUNTER — Inpatient Hospital Stay (HOSPITAL_BASED_OUTPATIENT_CLINIC_OR_DEPARTMENT_OTHER): Payer: Medicare Other | Admitting: Oncology

## 2023-01-14 VITALS — BP 142/56 | HR 76 | Temp 98.1°F | Resp 18 | Ht 76.0 in | Wt 216.0 lb

## 2023-01-14 DIAGNOSIS — D6959 Other secondary thrombocytopenia: Secondary | ICD-10-CM | POA: Insufficient documentation

## 2023-01-14 DIAGNOSIS — C189 Malignant neoplasm of colon, unspecified: Secondary | ICD-10-CM

## 2023-01-14 DIAGNOSIS — Z933 Colostomy status: Secondary | ICD-10-CM | POA: Diagnosis not present

## 2023-01-14 DIAGNOSIS — T451X5A Adverse effect of antineoplastic and immunosuppressive drugs, initial encounter: Secondary | ICD-10-CM | POA: Insufficient documentation

## 2023-01-14 DIAGNOSIS — D649 Anemia, unspecified: Secondary | ICD-10-CM | POA: Diagnosis not present

## 2023-01-14 DIAGNOSIS — Z5112 Encounter for antineoplastic immunotherapy: Secondary | ICD-10-CM | POA: Insufficient documentation

## 2023-01-14 DIAGNOSIS — C187 Malignant neoplasm of sigmoid colon: Secondary | ICD-10-CM | POA: Diagnosis not present

## 2023-01-14 DIAGNOSIS — I129 Hypertensive chronic kidney disease with stage 1 through stage 4 chronic kidney disease, or unspecified chronic kidney disease: Secondary | ICD-10-CM | POA: Diagnosis not present

## 2023-01-14 DIAGNOSIS — G620 Drug-induced polyneuropathy: Secondary | ICD-10-CM | POA: Diagnosis not present

## 2023-01-14 DIAGNOSIS — N189 Chronic kidney disease, unspecified: Secondary | ICD-10-CM | POA: Diagnosis not present

## 2023-01-14 DIAGNOSIS — Z23 Encounter for immunization: Secondary | ICD-10-CM

## 2023-01-14 LAB — CMP (CANCER CENTER ONLY)
ALT: <5 U/L (ref 0–44)
AST: 6 U/L — ABNORMAL LOW (ref 15–41)
Albumin: 3.3 g/dL — ABNORMAL LOW (ref 3.5–5.0)
Alkaline Phosphatase: 86 U/L (ref 38–126)
Anion gap: 7 (ref 5–15)
BUN: 39 mg/dL — ABNORMAL HIGH (ref 8–23)
CO2: 19 mmol/L — ABNORMAL LOW (ref 22–32)
Calcium: 8 mg/dL — ABNORMAL LOW (ref 8.9–10.3)
Chloride: 112 mmol/L — ABNORMAL HIGH (ref 98–111)
Creatinine: 2.36 mg/dL — ABNORMAL HIGH (ref 0.61–1.24)
GFR, Estimated: 28 mL/min — ABNORMAL LOW (ref >60)
Glucose, Bld: 145 mg/dL — ABNORMAL HIGH (ref 70–99)
Potassium: 4.5 mmol/L (ref 3.5–5.1)
Sodium: 138 mmol/L (ref 135–145)
Total Bilirubin: 0.4 mg/dL (ref 0.3–1.2)
Total Protein: 6.5 g/dL (ref 6.5–8.1)

## 2023-01-14 LAB — CBC WITH DIFFERENTIAL (CANCER CENTER ONLY)
Abs Immature Granulocytes: 0.04 10*3/uL (ref 0.00–0.07)
Basophils Absolute: 0 10*3/uL (ref 0.0–0.1)
Basophils Relative: 1 %
Eosinophils Absolute: 0.3 10*3/uL (ref 0.0–0.5)
Eosinophils Relative: 5 %
HCT: 31.2 % — ABNORMAL LOW (ref 39.0–52.0)
Hemoglobin: 9.7 g/dL — ABNORMAL LOW (ref 13.0–17.0)
Immature Granulocytes: 1 %
Lymphocytes Relative: 14 %
Lymphs Abs: 0.7 10*3/uL (ref 0.7–4.0)
MCH: 27.8 pg (ref 26.0–34.0)
MCHC: 31.1 g/dL (ref 30.0–36.0)
MCV: 89.4 fL (ref 80.0–100.0)
Monocytes Absolute: 0.6 10*3/uL (ref 0.1–1.0)
Monocytes Relative: 13 %
Neutro Abs: 3.3 10*3/uL (ref 1.7–7.7)
Neutrophils Relative %: 66 %
Platelet Count: 144 10*3/uL — ABNORMAL LOW (ref 150–400)
RBC: 3.49 MIL/uL — ABNORMAL LOW (ref 4.22–5.81)
RDW: 19.2 % — ABNORMAL HIGH (ref 11.5–15.5)
WBC Count: 4.9 10*3/uL (ref 4.0–10.5)
nRBC: 0 % (ref 0.0–0.2)

## 2023-01-14 LAB — MAGNESIUM: Magnesium: 2 mg/dL (ref 1.7–2.4)

## 2023-01-14 LAB — CEA (ACCESS): CEA (CHCC): 1.7 ng/mL (ref 0.00–5.00)

## 2023-01-14 MED ORDER — INFLUENZA VAC A&B SURF ANT ADJ 0.5 ML IM SUSY
0.5000 mL | PREFILLED_SYRINGE | INTRAMUSCULAR | Status: AC
  Administered 2023-01-14: 0.5 mL via INTRAMUSCULAR
  Filled 2023-01-14: qty 0.5

## 2023-01-14 MED ORDER — SODIUM CHLORIDE 0.9 % IV SOLN
4.0000 mg/kg | Freq: Once | INTRAVENOUS | Status: AC
  Administered 2023-01-14: 400 mg via INTRAVENOUS
  Filled 2023-01-14: qty 20

## 2023-01-14 MED ORDER — SODIUM CHLORIDE 0.9 % IV SOLN: Freq: Once | INTRAVENOUS | Status: AC

## 2023-01-14 MED ORDER — SODIUM CHLORIDE 0.9% FLUSH
10.0000 mL | INTRAVENOUS | Status: DC | PRN
  Administered 2023-01-14: 10 mL

## 2023-01-14 MED ORDER — HEPARIN SOD (PORK) LOCK FLUSH 100 UNIT/ML IV SOLN
500.0000 [IU] | Freq: Once | INTRAVENOUS | Status: AC | PRN
  Administered 2023-01-14: 500 [IU]

## 2023-01-14 NOTE — Patient Instructions (Signed)
[facility]   Discharge Instructions: Thank you for choosing [facility] to provide your oncology and hematology care.   If you have a lab appointment with the Cancer Center, please go directly to the Cancer Center and check in at the registration area.   Wear comfortable clothing and clothing appropriate for easy access to any Portacath or PICC line.   We strive to give you quality time with your provider. You may need to reschedule your appointment if you arrive late (15 or more minutes).  Arriving late affects you and other patients whose appointments are after yours.  Also, if you miss three or more appointments without notifying the office, you may be dismissed from the clinic at the provider's discretion.      For prescription refill requests, have your pharmacy contact our office and allow 72 hours for refills to be completed.    Today you received the following chemotherapy and/or immunotherapy agents Vectibix      To help prevent nausea and vomiting after your treatment, we encourage you to take your nausea medication as directed.  BELOW ARE SYMPTOMS THAT SHOULD BE REPORTED IMMEDIATELY: *FEVER GREATER THAN 100.4 F (38 C) OR HIGHER *CHILLS OR SWEATING *NAUSEA AND VOMITING THAT IS NOT CONTROLLED WITH YOUR NAUSEA MEDICATION *UNUSUAL SHORTNESS OF BREATH *UNUSUAL BRUISING OR BLEEDING *URINARY PROBLEMS (pain or burning when urinating, or frequent urination) *BOWEL PROBLEMS (unusual diarrhea, constipation, pain near the anus) TENDERNESS IN MOUTH AND THROAT WITH OR WITHOUT PRESENCE OF ULCERS (sore throat, sores in mouth, or a toothache) UNUSUAL RASH, SWELLING OR PAIN  UNUSUAL VAGINAL DISCHARGE OR ITCHING   Items with * indicate a potential emergency and should be followed up as soon as possible or go to the Emergency Department if any problems should occur.  Please show the CHEMOTHERAPY ALERT CARD or IMMUNOTHERAPY ALERT CARD at  check-in to the Emergency Department and triage nurse.  Should you have questions after your visit or need to cancel or reschedule your appointment, please contact [facility]  Dept: [phone]  and follow the prompts.  Office hours are 8:00 a.m. to 4:30 p.m. Monday - Friday. Please note that voicemails left after 4:00 p.m. may not be returned until the following business day.  We are closed weekends and major holidays. You have access to a nurse at all times for urgent questions. Please call the main number to the clinic Dept: [phone] and follow the prompts.   For any non-urgent questions, you may also contact your provider using MyChart. We now offer e-Visits for anyone 42 and older to request care online for non-urgent symptoms. For details visit [email or web address].   Also download the MyChart app! Go to the app store, search "MyChart", open the app, select [facility], and log in with your MyChart username and password.  Panitumumab Injection What is this medication? PANITUMUMAB (pan i TOOM ue mab) treats colorectal cancer. It works by blocking a protein that causes cancer cells to grow and multiply. This helps to slow or stop the spread of cancer cells. It is a monoclonal antibody. This medicine may be used for other purposes; ask your health care provider or pharmacist if you have questions. COMMON BRAND NAME(S): Vectibix What should I tell my care team before I take this medication? They need to know if you have any of these conditions: Eye disease Low levels of magnesium in the blood Lung disease An unusual or allergic reaction  to panitumumab, other medications, foods, dyes, or preservatives Pregnant or trying to get pregnant Breast-feeding How should I use this medication? This medication is injected into a vein. It is given by your care team in a hospital or clinic setting. Talk to your care team about the use of this medication in  children. Special care may be needed. Overdosage: If you think you have taken too much of this medicine contact a poison control center or emergency room at once. NOTE: This medicine is only for you. Do not share this medicine with others. What if I miss a dose? Keep appointments for follow-up doses. It is important not to miss your dose. Call your care team if you are unable to keep an appointment. What may interact with this medication? Bevacizumab This list may not describe all possible interactions. Give your health care provider a list of all the medicines, herbs, non-prescription drugs, or dietary supplements you use. Also tell them if you smoke, drink alcohol, or use illegal drugs. Some items may interact with your medicine. What should I watch for while using this medication? Your condition will be monitored carefully while you are receiving this medication. This medication may make you feel generally unwell. This is not uncommon as chemotherapy can affect healthy cells as well as cancer cells. Report any side effects. Continue your course of treatment even though you feel ill unless your care team tells you to stop. This medication can make you more sensitive to the sun. Keep out of the sun while receiving this medication and for 2 months after stopping therapy. If you cannot avoid being in the sun, wear protective clothing and sunscreen. Do not use sun lamps, tanning beds, or tanning booths. Check with your care team if you have severe diarrhea, nausea, and vomiting or if you sweat a lot. The loss of too much body fluid may make it dangerous for you to take this medication. This medication may cause serious skin reactions. They can happen weeks to months after starting the medication. Contact your care team right away if you notice fevers or flu-like symptoms with a rash. The rash may be red or purple and then turn into blisters or peeling of the skin. You may also notice a red rash with  swelling of the face, lips, or lymph nodes in your neck or under your arms. Talk to your care team if you may be pregnant. Serious birth defects can occur if you take this medication during pregnancy and for 2 months after the last dose. Contraception is recommended while taking this medication and for 2 months after the last dose. Your care team can help you find the option that works for you. Do not breastfeed while taking this medication and for 2 months after the last dose. This medication may cause infertility. Talk to your care team if you are concerned about your fertility. What side effects may I notice from receiving this medication? Side effects that you should report to your care team as soon as possible: Allergic reactions--skin rash, itching, hives, swelling of the face, lips, tongue, or throat Dry cough, shortness of breath or trouble breathing Eye pain, redness, irritation, or discharge with blurry or decreased vision Infusion reactions--chest pain, shortness of breath or trouble breathing, feeling faint or lightheaded Low magnesium level--muscle pain or cramps, unusual weakness or fatigue, fast or irregular heartbeat, tremors Low potassium level--muscle pain or cramps, unusual weakness or fatigue, fast or irregular heartbeat, constipation Redness, blistering, peeling, or loosening of   the skin, including inside the mouth Skin reactions on sun-exposed areas Side effects that usually do not require medical attention (report to your care team if they continue or are bothersome): Change in nail shape, thickness, or color Diarrhea Dry skin Fatigue Nausea Vomiting This list may not describe all possible side effects. Call your doctor for medical advice about side effects. You may report side effects to FDA at 1-800-FDA-1088. Where should I keep my medication? This medication is given in a hospital or clinic. It will not be stored at home. NOTE: This sheet is a summary. It may not  cover all possible information. If you have questions about this medicine, talk to your doctor, pharmacist, or health care provider.  2024 Elsevier/Gold Standard (2021-09-04 00:00:00)

## 2023-01-14 NOTE — Progress Notes (Signed)
Patient seen by Dr. Truett Perna today  Vitals are within treatment parameters.  Labs reviewed by Dr. Truett Perna and are not all within treatment parameters. Creatinine 2.36 --OK to proceed  Per physician team, patient is ready for treatment and there are NO modifications to the treatment plan.  Patient requesting flu vaccine today

## 2023-01-14 NOTE — Patient Instructions (Signed)

## 2023-01-14 NOTE — Progress Notes (Signed)
[facility] OFFICE PROGRESS NOTE   Diagnosis: Colon cancer  INTERVAL HISTORY:   Louis [name] returns as scheduled.  He continues encorafenib and panitumumab.  The skin rash has improved.  No diarrhea.  Good appetite.  He generally feels well.  He has developed numbness at the soles of the feet for the past several months.  He saw a rheumatology has been referred for physical therapy.  He began vitamin B12 therapy.  Objective:  Vital signs in last 24 hours:  Blood pressure (!) 142/56, pulse 76, temperature 98.1 F (36.7 C), temperature source Oral, resp. rate 18, height 6\' 4"  (1.93 m), weight 216 lb (98 kg), SpO2 100%.    HEENT: No thrush or ulcers Lymphatics: 1-1.5 cm mobile left axillary node, 4-5 cm firm left inguinal node Resp: Lungs clear bilaterally Cardio: Regular rate and rhythm GI: No hepatosplenomegaly, nontender Vascular: No leg edema Neurologic: 4+/5 strength with flexion at the right hip, the leg and foot strength is otherwise intact Skin: Acne type rash over the trunk  Portacath/PICC-without erythema  Lab Results:  Lab Results  Component Value Date   WBC 4.9 01/14/2023   HGB 9.7 (L) 01/14/2023   HCT 31.2 (L) 01/14/2023   MCV 89.4 01/14/2023   PLT 144 (L) 01/14/2023   NEUTROABS 3.3 01/14/2023    CMP  Lab Results  Component Value Date   NA 138 01/14/2023   K 4.5 01/14/2023   CL 112 (H) 01/14/2023   CO2 19 (L) 01/14/2023   GLUCOSE 145 (H) 01/14/2023   BUN 39 (H) 01/14/2023   CREATININE 2.36 (H) 01/14/2023   CALCIUM 8.0 (L) 01/14/2023   PROT 6.5 01/14/2023   ALBUMIN 3.3 (L) 01/14/2023   AST 6 (L) 01/14/2023   ALT <5 01/14/2023   ALKPHOS 86 01/14/2023   BILITOT 0.4 01/14/2023   GFRNONAA 28 (L) 01/14/2023   GFRAA 28 (L) 09/20/2019    Lab Results  Component Value Date   CEA1 2.61 01/02/2021   CEA 1.70 01/14/2023     Medications: I have reviewed the patient's current medications.   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), isolated mesenteric implant-resected, multiple tumor deposits Sigmoid/descending resection and creation of a descending colostomy 04/10/2016 MSI-stable, no loss of mismatch repair protein expression Foundation 1- BRAF V600E positive, MS-stable, intermediate tumor mutation burden, no RAS mutation CT abdomen/pelvis 04/02/2016-no evidence of distant metastatic disease Cycle 1 FOLFOX 05/21/2016 Cycle 2 FOLFOX 06/04/2016 Cycle 3 FOLFOX 06/18/2016 Cycle 4 FOLFOX 07/02/2016 (oxaliplatin held secondary to thrombocytopenia) Cycle 5 FOLFOX 07/16/2016 Cycle 6 FOLFOX 07/30/2016 Cycle 7 FOLFOX 08/13/2016 (oxaliplatin held and 5-FU dose reduced) Cycle 8 FOLFOX 09/03/2016 (oxaliplatin held secondary to neuropathy) Cycle 9 FOLFOX 09/17/2016 (oxaliplatin held secondary to neuropathy) Cycle 10 FOLFOX 10/02/2016 Cycle 11 FOLFOX 10/15/2016 (oxaliplatin eliminated from the regimen) Cycle 12 FOLFOX 10/30/2016 (oxaliplatin eliminated) CTs 05/12/2017-no evidence of recurrent disease, right inguinal hernia containing bladder Colonoscopy 07/21/2017, 3 polyps were removed from the descending and transverse colon, fragments of tubular and tubulovillous adenoma CTs 05/19/2018-no evidence of recurrent disease, status post hernia repair, right upper lobe pneumonia CTs 05/20/2019-resolution of right upper lobe pneumonia, no evidence of metastatic disease Colonoscopy 01/25/2020-multiple polyps removed-tubular adenomas, poor preparation, repeat colonoscopy recommended CTs neck, chest, and abdomen/pelvis 08/27/2020- new 2 centimeter left axillary node, new 1.9 cm left inguinal node chronic mildly prominent portacaval node Ultrasound biopsy of left inguinal node on 10/29/2020-metastatic adenocarcinoma consistent with colon adenocarcinoma PET 11/12/2020-enlarged hypermetabolic left inguinal and left axillary nodes, solitary focus of  hypermetabolic activity in the right lower quadrant small bowel Cycle 1 FOLFOX  12/19/2020 Cycle 2 FOLFOX 01/02/2021, oxaliplatin dose reduced secondary to neutropenia and thrombocytopenia Cycle 3 FOLFOX 01/16/2021 Cycle 4 FOLFOX 01/30/2021 CTs 02/11/2021-no change in left inguinal and left axillary nodes, no evidence of disease progression, stable wall thickening involving a loop of distal ileum Cycle 1 FOLFIRI/Avastin 02/27/2021 Cycle 2 FOLFIRI/Avastin 03/13/2021 Cycle 3 FOLFIRI/Avastin 04/03/2021 Cycle 4 FOLFIRI 04/17/2021, Avastin held due to elevated urine protein 05/07/2021-24-hour urine protein elevated 1562 Cycle 5 FOLFIRI 05/08/2021, Avastin held due to elevated urine protein, irinotecan dose reduced due to in general not feeling well after treatment CTs 05/17/2021-left axillary and left inguinal lymph nodes are smaller, no evidence of disease progression Cycle 6 FOLFIRI 05/22/2021, Avastin held due to proteinuria 06/03/2021 24-hour urine with 1.9 g of protein Cycle 7 FOLFIRI 06/12/2021, Avastin held due to proteinuria Cycle 8 FOLFIRI 07/03/2021, Avastin held due to proteinuria Cycle 9 FOLFIRI 07/24/2021, Avastin held due to proteinuria Cycle 10 FOLFIRI 08/19/2021, Avastin held due to proteinuria CTs 09/02/2021-no change in the left axillary, left inguinal, and portacaval lymph nodes.  No mention of the distal small bowel thickening Maintenance 5-fluorouracil 09/18/2021 CT abdomen/pelvis 10/28/2021-enlarged left inguinal lymph node, stable; stomach and small bowel grossly unremarkable. Maintenance 5-fluorouracil continued Colonoscopy 12/10/2021-terminal ileum with an ulcerated partially obstructing medium-sized mass approximately 15 to 20 cm from the IC valve, invasive adenocarcinoma, colonic type, moderately differentiated (grade 2) PET scan 12/18/2021-progression of disease in the peritoneal space.  2 intensely hypermetabolic nodes in the left axilla, one node is new from the prior, the other is reduced in size and metabolic activity; decrease in size and metabolic activity of left  inguinal adenopathy; intense metabolic activity associate with a loop of small bowel in the right lower quadrant, no interval change; new small hypermetabolic peritoneal implant along the ventral peritoneal surface just right of midline; larger new peritoneal implant in the deep right pelvis; small new implant in the right mid abdomen Encorafenib/Panitumumab 01/08/2022 CTs 03/17/2022-decrease size of mesenteric implant at the posterior bladder wall, small mesenteric peritoneal lesions identified on PET/CT or not evident.  Decrease size of left inguinal left axillary nodes, hypermetabolic activity noted at the anterior left prostate on comparison August 2023 PET CTs 07/21/2022-decrease size of a left axillary lymph node and right pelvic implant.  Left inguinal lymph node measures smaller.  No new sites of disease identified.  Similar tiny pulmonary nodules. Encorafenib and Panitumumab continued, Panitumumab changed from a 2-week schedule to a 3-week schedule 07/23/2022 CT 10/30/2022-stable left axillary lymph node, no new or progressive metastatic disease in the chest; stable left inguinal lymphadenopathy; decrease in size of deep right peritoneal implant; no new metastatic disease in the abdomen/pelvis. encorafenib and Panitumumab continued Panitumumab dose reduced 11/25/2022 due to skin rash   2.   Chronic renal insufficiency   3.   Hypertension   4.  Inflamed sebaceous cyst at the upper back-status post incision and drainage   5.  Thrombocytopenia secondary chemotherapy-oxaliplatin dose reduced beginning with cycle 3 FOLFOX, oxaliplatin held with cycle 4 and cycle 7 FOLFOX   6.  Oxaliplatin neuropathy-improved   7.  History of Mucositis secondary chemotherapy   8.  Symptoms of pneumonia January 2020-CT 05/19/2018 consistent with right upper lobe pneumonia, Levaquin prescribed 05/20/2018   9.  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  Anemia,  progressive 10/29/2021 2 units packed red blood cells 10/31/2021 Ferritin low  10/29/2021-oral iron Ferritin low 01/22/2022   12.  Colonoscopy 12/10/2021-terminal ileum with an ulcerated partially obstructing medium-sized mass approximately 15 to 20 cm from the IC valve, invasive adenocarcinoma, colonic type, moderately differentiated (grade 2) 13.  Admission 09/02/2022 with atrial flutter and rapid ventricular response 14.  Neuropathy?  Of the feet    Disposition: [name] has metastatic colon cancer.  He continues treatment with encorafenib and panitumumab.  He will complete another cycle of panitumumab today.  The skin rash appears improved with the dose reduction of panitumumab.  Neuropathy is an uncommon side effect associated with encorafenib.  We will discontinue encorafenib if neuropathy symptoms progress.  He will return for an office visit and panitumumab in 3 weeks.  He will be scheduled for a restaging CT after the February 04, 2023 cycle of panitumumab.  Thornton Papas, MD  01/14/2023  9:53 AM

## 2023-01-14 NOTE — Progress Notes (Signed)
Please call patient and review results and refer for Mohs with Dr Caralyn Guile for positive skin cancer(s)  Thanks!  Diagnosis 1. Skin , right lower leg BASAL CELL CARCINOMA, SUPERFICIAL AND NODULAR PATTERNS  --> MOHS 2. Skin , right forehead CONSISTENT WITH SURFACE OF AN EPIDERMOID CYST 3. Skin , left cheek VERRUCA VULGARIS, IRRITATED

## 2023-01-20 ENCOUNTER — Telehealth: Payer: Self-pay

## 2023-01-20 ENCOUNTER — Ambulatory Visit: Payer: Medicare Other | Admitting: Oncology

## 2023-01-20 ENCOUNTER — Other Ambulatory Visit: Payer: Medicare Other

## 2023-01-20 ENCOUNTER — Ambulatory Visit: Payer: Medicare Other

## 2023-01-20 NOTE — Telephone Encounter (Signed)
Spoke with pt and gave him bx results and recommendations and explained Mohs to him and told him we'd call back to sched that appt

## 2023-01-21 ENCOUNTER — Other Ambulatory Visit: Payer: Self-pay

## 2023-01-26 DIAGNOSIS — N184 Chronic kidney disease, stage 4 (severe): Secondary | ICD-10-CM | POA: Diagnosis not present

## 2023-01-27 DIAGNOSIS — N184 Chronic kidney disease, stage 4 (severe): Secondary | ICD-10-CM | POA: Diagnosis not present

## 2023-01-28 ENCOUNTER — Encounter: Payer: Medicare Other | Admitting: Dermatology

## 2023-01-30 ENCOUNTER — Other Ambulatory Visit (HOSPITAL_COMMUNITY): Payer: Self-pay | Admitting: Internal Medicine

## 2023-02-03 ENCOUNTER — Ambulatory Visit: Payer: Medicare Other | Attending: Rheumatology

## 2023-02-03 ENCOUNTER — Other Ambulatory Visit: Payer: Self-pay

## 2023-02-03 DIAGNOSIS — M6281 Muscle weakness (generalized): Secondary | ICD-10-CM

## 2023-02-03 DIAGNOSIS — R262 Difficulty in walking, not elsewhere classified: Secondary | ICD-10-CM

## 2023-02-03 DIAGNOSIS — R293 Abnormal posture: Secondary | ICD-10-CM | POA: Diagnosis not present

## 2023-02-03 DIAGNOSIS — M25561 Pain in right knee: Secondary | ICD-10-CM | POA: Diagnosis not present

## 2023-02-03 DIAGNOSIS — G8929 Other chronic pain: Secondary | ICD-10-CM | POA: Diagnosis not present

## 2023-02-03 DIAGNOSIS — R252 Cramp and spasm: Secondary | ICD-10-CM

## 2023-02-03 NOTE — Therapy (Signed)
OUTPATIENT PHYSICAL THERAPY LOWER EXTREMITY EVALUATION   Patient Name: Louis [name]. MRN: [id number] DOB:Nov 25, 1945, 77 y.o., male Today's Date: 02/03/2023  END OF SESSION:  PT End of Session - 02/03/23 1706     Visit Number 1    Date for PT Re-Evaluation 03/31/23    Authorization Type Medicare    Progress Note Due on Visit 10    PT Start Time 1530    PT Stop Time 1615    PT Time Calculation (min) 45 min    Activity Tolerance Patient limited by fatigue;Patient limited by pain    Behavior During Therapy [facility] for tasks assessed/performed             Past Medical History:  Diagnosis Date   Allergy    seasonal allergies   Anemia    Arthritis 2010   Ascending aorta dilatation (HCC)    4.1 cm noted on CT1/15/21   Basal cell carcinoma 01/08/2023   right lower leg  needs mohs   Blood transfusion without reported diagnosis    CKD (chronic kidney disease) stage 2, GFR 60-89 ml/min    see [place], Stage 1   colon ca dx'd 04/2016   colon   Hernia, abdominal    sx fixed   Hyperlipidemia    on meds   Hypertension    on meds   LBBB (left bundle branch block) 08/18/2019   Neuropathy    Pneumonia 05/19/2018   Seasonal allergies    Thrombocytopenia (HCC)    during chemo.   Past Surgical History:  Procedure Laterality Date   A-FLUTTER ABLATION N/A 10/10/2022   Procedure: A-FLUTTER ABLATION;  Surgeon: Regan Lemming, MD;  Location: [facility] INVASIVE CV LAB;  Service: Cardiovascular;  Laterality: N/A;   BIOPSY  04/10/2016   Procedure: BIOPSY Mesentary;  Surgeon: Jimmye Norman, MD;  Location: [facility] OR;  Service: General;;   CARDIOVERSION N/A 09/04/2022   Procedure: CARDIOVERSION;  Surgeon: Sande Rives, MD;  Location: [facility] INVASIVE CV LAB;  Service: Cardiovascular;  Laterality: N/A;   COLONOSCOPY  2019   TA/tubulovillous adenoma   COLOSTOMY  2018   reversed   COLOSTOMY REVERSAL N/A 01/12/2017   Procedure: COLOSTOMY REVERSAL;  Surgeon: Jimmye Norman, MD;  Location:  [facility] OR;  Service: General;  Laterality: N/A;   FLEXIBLE SIGMOIDOSCOPY N/A 04/09/2016   Procedure: FLEXIBLE SIGMOIDOSCOPY;  Surgeon: Meryl Dare, MD;  Location: [facility] ENDOSCOPY;  Service: Endoscopy;  Laterality: N/A;   HERNIA REPAIR     in childhood   INGUINAL HERNIA REPAIR Right 03/29/2018   Procedure: OPEN REPAIR OF RIGHT INGUINAL HERNIA WITH MESH;  Surgeon: Jimmye Norman, MD;  Location: [facility] OR;  Service: General;  Laterality: Right;   INSERTION OF MESH N/A 03/29/2018   Procedure: INSERTION OF MESH;  Surgeon: Jimmye Norman, MD;  Location: [facility] OR;  Service: General;  Laterality: N/A;   IR GENERIC HISTORICAL  05/14/2016   IR US GUIDE VASC ACCESS RIGHT 05/14/2016 Gilmer Mor, DO [facility]-INTERV RAD   IR GENERIC HISTORICAL  05/14/2016   IR FLUORO GUIDE PORT INSERTION RIGHT 05/14/2016 Gilmer Mor, DO [facility]-INTERV RAD   IR IMAGING GUIDED PORT INSERTION  12/17/2020   IR REMOVAL TUN ACCESS W/ PORT W/O FL MOD SED  11/11/2016   PARTIAL COLECTOMY N/A 04/10/2016   Procedure: SIGMOID  COLECTOMY WITH COLOSTOMY;  Surgeon: Jimmye Norman, MD;  Location: [facility] OR;  Service: General;  Laterality: N/A;   TEE WITHOUT CARDIOVERSION N/A 09/04/2022   Procedure: TRANSESOPHAGEAL ECHOCARDIOGRAM;  Surgeon:  Sande Rives, MD;  Location: [facility] INVASIVE CV LAB;  Service: Cardiovascular;  Laterality: N/A;   TONSILLECTOMY     TOTAL KNEE ARTHROPLASTY Left 09/29/2019   Procedure: TOTAL KNEE ARTHROPLASTY;  Surgeon: Durene Romans, MD;  Location: [facility] ORS;  Service: Orthopedics;  Laterality: Left;  70 mins   Patient Active Problem List   Diagnosis Date Noted   Hypercoagulable state due to paroxysmal atrial fibrillation (HCC) 09/22/2022   Atrial flutter with rapid ventricular response (HCC) 09/03/2022   Acute congestive heart failure (HCC) 09/03/2022   Atrial flutter (HCC) 09/03/2022   Atrial fibrillation with RVR (HCC) 09/02/2022   CKD (chronic kidney disease), stage IV (HCC) 09/02/2022   Elevated troponin 09/02/2022   Anemia 09/02/2022    Metabolic acidosis 09/02/2022   SOB (shortness of breath) 09/02/2022   Leg edema, right 09/02/2022   Goals of care, counseling/discussion 12/05/2020   S/P left TKA 09/29/2019   Status post total left knee replacement 09/29/2019   Preoperative cardiovascular examination 08/18/2019   S/P right inguinal hernia repair 03/29/2018   New onset left bundle branch block (LBBB) 01/12/2018   Atherosclerotic vascular disease 01/12/2018   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   Port catheter in place 07/02/2016   Aorto-iliac atherosclerosis (HCC) 04/10/2016   Elevated fasting glucose 04/10/2016   Right inguinal hernia 04/10/2016   Adenocarcinoma of colon (HCC) 04/10/2016   Enterocolitis    Essential hypertension 04/04/2016   Acute kidney injury superimposed on chronic kidney disease Stage 2 (HCC) 04/04/2016   Ileus (HCC) 04/02/2016    PCP: [name], MD   REFERRING PROVIDER: [name], MD  REFERRING DIAG: [id number] (ICD-10-CM) - Pain in right knee  THERAPY DIAG:  Chronic pain of right knee - Plan: PT plan of care cert/re-cert  Difficulty in walking, not elsewhere classified - Plan: PT plan of care cert/re-cert  Cramp and spasm - Plan: PT plan of care cert/re-cert  Muscle weakness (generalized) - Plan: PT plan of care cert/re-cert  Abnormal posture - Plan: PT plan of care cert/re-cert  Rationale for Evaluation and Treatment: Rehabilitation  ONSET DATE: 02/02/2023  SUBJECTIVE:   SUBJECTIVE STATEMENT: Patient reports long hx of chemo due to his form of cancer.  He is currently on oral and IV chemo meds.  He also recently had cardiac ablation (June).  Has been in A-fib a couple of times since then.  The ablation was originally for Atrial flutter  PERTINENT HISTORY: Cancer : takes oral and I.V chemo PAIN:  Are you having pain? Yes: NPRS scale: 0 currently 5-6 when my knee acts up/10 Pain location: right knee Pain description: aching Aggravating factors:  standing/walking Relieving factors: rest  PRECAUTIONS: None  RED FLAGS: None   WEIGHT BEARING RESTRICTIONS: No  FALLS:  Has patient fallen in last 6 months? Yes. Number of falls 2  LIVING ENVIRONMENT: Lives with: lives with their spouse Lives in: House/apartment Stairs: Yes: External: 6 steps; on right going up Has following equipment at home: Single point cane  OCCUPATION: truck sales at [employer]   PLOF: Independent, Independent with basic ADLs, Independent with household mobility with device, Independent with community mobility with device, Independent with homemaking with ambulation, Independent with gait, Independent with transfers, Vocation/Vocational requirements: desk job, and Leisure: the beach once in a while, watches TV, can't do much else.  PATIENT GOALS: to stop falling and walk better, gain strength  NEXT MD VISIT: prn  OBJECTIVE:  Note: Objective measures were completed at Evaluation unless otherwise noted.  DIAGNOSTIC  FINDINGS: na  PATIENT SURVEYS:  FOTO 33, predicted 55  COGNITION: Overall cognitive status: Within functional limits for tasks assessed     SENSATION: WFL   MUSCLE LENGTH: Hamstrings: Right 45 deg; Left 45 deg Thomas test: Right pos; Left pos  POSTURE: rounded shoulders, forward head, decreased lumbar lordosis, increased thoracic kyphosis, and posterior pelvic tilt  PALPATION: Mild crepitus bilateral knees  LOWER EXTREMITY ROM:  WFL bilaterally  LOWER EXTREMITY MMT:  Generally 3+/5 bilateral hips, 4-/5 bilateral knees and ankles  LOWER EXTREMITY SPECIAL TESTS:  Knee special tests: Patellafemoral apprehension test: negative and Patellafemoral grind test: positive   FUNCTIONAL TESTS:  5 times sit to stand: 32.50 sec Timed up and go (TUG): 20.33  GAIT: Distance walked: 30 Assistive device utilized: Single point cane Level of assistance: SBA Comments: short step length but consistent heel strike with both feet  everted   TODAY'S TREATMENT:                                                                                                                              DATE: 02/03/23 Initial eval completed and initiated HEP    PATIENT EDUCATION:  Education details: Initial HEP, educated on anatomy of the knee and various areas/compartments for OA, importance of proper footwear, quad strength and hip strength for alignment Person educated: Patient Education method: Programmer, multimedia, Demonstration, Verbal cues, and Handouts Education comprehension: verbalized understanding, returned demonstration, and verbal cues required  HOME EXERCISE PROGRAM: Access Code: [email or web address] URL: [email or web address] Date: 02/03/2023 Prepared by: Mikey Kirschner  Exercises - Standing Hamstring Stretch on Chair  - 1 x daily - 7 x weekly - 1 sets - 3 reps - 30 sec hold - Standing Quad Stretch with Table and Chair Support  - 1 x daily - 7 x weekly - 1 sets - 3 reps - 30 sec hold - Supine Quadricep Sets  - 1 x daily - 7 x weekly - 1 sets - 20 reps - Supine Knee Extension Strengthening  - 1 x daily - 7 x weekly - 1 sets - 20 reps - Small Range Straight Leg Raise  - 1 x daily - 7 x weekly - 2 sets - 10 reps - Seated Long Arc Quad  - 1 x daily - 7 x weekly - 1 sets - 20 reps  ASSESSMENT:  CLINICAL IMPRESSION: Patient is a 77 y.o. male who was seen today for physical therapy evaluation and treatment for right knee pain, balance and falls.  He presents with decreased ROM, strength and overall function along with elevated pain in right knee.  He should respond well to quad rehab, hip strengthening, education on fall prevention, balance training and proprioception training, and modalities to control pain.  Prognosis is fair for meeting goals due to low motivation level and co-morbidities.  He would benefit from skilled PT to meet the below mentioned goals.    OBJECTIVE IMPAIRMENTS: decreased balance, decreased knowledge of  condition, difficulty  walking, decreased ROM, decreased strength, increased fascial restrictions, increased muscle spasms, impaired flexibility, postural dysfunction, and pain.   ACTIVITY LIMITATIONS: carrying, lifting, bending, sitting, standing, squatting, sleeping, stairs, transfers, bed mobility, toileting, and hygiene/grooming  PARTICIPATION LIMITATIONS: meal prep, cleaning, laundry, driving, shopping, community activity, occupation, yard work, and church  PERSONAL FACTORS: Fitness, Profession, Time since onset of injury/illness/exacerbation, and 3+ comorbidities: OA, HTN, Active cancer, atrial flutter  are also affecting patient's functional outcome.   REHAB POTENTIAL: Good  CLINICAL DECISION MAKING: Stable/uncomplicated  EVALUATION COMPLEXITY: Low   GOALS: Goals reviewed with patient? Yes  SHORT TERM GOALS: Target date: 03/03/2023  Patient will be independent with initial HEP  Baseline: Goal status: INITIAL  2.  Pain report to be no greater than 4/10  Baseline:  Goal status: INITIAL  3. Patient to report no falls in first 4 weeks of PT  Baseline: 2 falls  Goal status: INITIAL   LONG TERM GOALS: Target date: 03/31/2023   Patient to be independent with advanced HEP  Baseline:  Goal status: INITIAL  2.  Patient to report pain no greater than 2/10  Baseline:  Goal status: INITIAL  3.  Patient to be able to ascend and descend steps without pain or no greater than 2/10  Baseline:  Goal status: INITIAL  4.  Patient to be able to bend, stoop and squat with pain no greater than 2/10  Baseline:  Goal status: INITIAL  5.  Patient to report 85% improvement in overall symptoms Baseline:  Goal status: INITIAL  6.  Patient to report increased confidence and reduced feeling of falling Baseline:  Goal status: INITIAL   PLAN:  PT FREQUENCY: 1-2x/week  PT DURATION: 8 weeks  PLANNED INTERVENTIONS: Therapeutic exercises, Therapeutic activity, Neuromuscular  re-education, Balance training, Gait training, Patient/Family education, Self Care, Joint mobilization, Stair training, Orthotic/Fit training, DME instructions, Aquatic Therapy, Dry Needling, Electrical stimulation, Cryotherapy, Moist heat, Splintting, Taping, Vasopneumatic device, Ultrasound, Biofeedback, Ionotophoresis 4mg /ml Dexamethasone, Manual therapy, and Re-evaluation  PLAN FOR NEXT SESSION: Nustep, review HEP, progress quad rehab and add hip strengthening.    Vernell Barrier, PT 02/03/2023, 5:15 PM

## 2023-02-04 ENCOUNTER — Inpatient Hospital Stay: Payer: Medicare Other

## 2023-02-04 ENCOUNTER — Encounter: Payer: Self-pay | Admitting: Nurse Practitioner

## 2023-02-04 ENCOUNTER — Inpatient Hospital Stay: Payer: Medicare Other | Attending: Oncology

## 2023-02-04 ENCOUNTER — Inpatient Hospital Stay (HOSPITAL_BASED_OUTPATIENT_CLINIC_OR_DEPARTMENT_OTHER): Payer: Medicare Other | Admitting: Nurse Practitioner

## 2023-02-04 VITALS — BP 138/64 | HR 67 | Temp 97.8°F | Resp 16 | Ht 76.0 in | Wt 218.1 lb

## 2023-02-04 VITALS — BP 118/60 | HR 60 | Resp 18

## 2023-02-04 DIAGNOSIS — N189 Chronic kidney disease, unspecified: Secondary | ICD-10-CM | POA: Insufficient documentation

## 2023-02-04 DIAGNOSIS — Z452 Encounter for adjustment and management of vascular access device: Secondary | ICD-10-CM | POA: Insufficient documentation

## 2023-02-04 DIAGNOSIS — C189 Malignant neoplasm of colon, unspecified: Secondary | ICD-10-CM

## 2023-02-04 DIAGNOSIS — R110 Nausea: Secondary | ICD-10-CM | POA: Diagnosis not present

## 2023-02-04 DIAGNOSIS — C187 Malignant neoplasm of sigmoid colon: Secondary | ICD-10-CM | POA: Insufficient documentation

## 2023-02-04 DIAGNOSIS — D6959 Other secondary thrombocytopenia: Secondary | ICD-10-CM | POA: Diagnosis not present

## 2023-02-04 DIAGNOSIS — I129 Hypertensive chronic kidney disease with stage 1 through stage 4 chronic kidney disease, or unspecified chronic kidney disease: Secondary | ICD-10-CM | POA: Insufficient documentation

## 2023-02-04 DIAGNOSIS — C774 Secondary and unspecified malignant neoplasm of inguinal and lower limb lymph nodes: Secondary | ICD-10-CM | POA: Diagnosis not present

## 2023-02-04 DIAGNOSIS — R21 Rash and other nonspecific skin eruption: Secondary | ICD-10-CM | POA: Diagnosis not present

## 2023-02-04 DIAGNOSIS — G620 Drug-induced polyneuropathy: Secondary | ICD-10-CM | POA: Diagnosis not present

## 2023-02-04 DIAGNOSIS — D649 Anemia, unspecified: Secondary | ICD-10-CM | POA: Diagnosis not present

## 2023-02-04 DIAGNOSIS — Z5112 Encounter for antineoplastic immunotherapy: Secondary | ICD-10-CM | POA: Insufficient documentation

## 2023-02-04 LAB — CMP (CANCER CENTER ONLY)
ALT: <5 U/L (ref 0–44)
AST: 8 U/L — ABNORMAL LOW (ref 15–41)
Albumin: 3.4 g/dL — ABNORMAL LOW (ref 3.5–5.0)
Alkaline Phosphatase: 73 U/L (ref 38–126)
Anion gap: 7 (ref 5–15)
BUN: 32 mg/dL — ABNORMAL HIGH (ref 8–23)
CO2: 20 mmol/L — ABNORMAL LOW (ref 22–32)
Calcium: 8.4 mg/dL — ABNORMAL LOW (ref 8.9–10.3)
Chloride: 110 mmol/L (ref 98–111)
Creatinine: 2.34 mg/dL — ABNORMAL HIGH (ref 0.61–1.24)
GFR, Estimated: 28 mL/min — ABNORMAL LOW (ref >60)
Glucose, Bld: 149 mg/dL — ABNORMAL HIGH (ref 70–99)
Potassium: 4.6 mmol/L (ref 3.5–5.1)
Sodium: 137 mmol/L (ref 135–145)
Total Bilirubin: 0.4 mg/dL (ref 0.3–1.2)
Total Protein: 6.4 g/dL — ABNORMAL LOW (ref 6.5–8.1)

## 2023-02-04 LAB — CBC WITH DIFFERENTIAL (CANCER CENTER ONLY)
Abs Immature Granulocytes: 0.03 10*3/uL (ref 0.00–0.07)
Basophils Absolute: 0 10*3/uL (ref 0.0–0.1)
Basophils Relative: 1 %
Eosinophils Absolute: 0.2 10*3/uL (ref 0.0–0.5)
Eosinophils Relative: 4 %
HCT: 30.3 % — ABNORMAL LOW (ref 39.0–52.0)
Hemoglobin: 9.4 g/dL — ABNORMAL LOW (ref 13.0–17.0)
Immature Granulocytes: 1 %
Lymphocytes Relative: 11 %
Lymphs Abs: 0.5 10*3/uL — ABNORMAL LOW (ref 0.7–4.0)
MCH: 28.1 pg (ref 26.0–34.0)
MCHC: 31 g/dL (ref 30.0–36.0)
MCV: 90.4 fL (ref 80.0–100.0)
Monocytes Absolute: 0.5 10*3/uL (ref 0.1–1.0)
Monocytes Relative: 10 %
Neutro Abs: 3.5 10*3/uL (ref 1.7–7.7)
Neutrophils Relative %: 73 %
Platelet Count: 163 10*3/uL (ref 150–400)
RBC: 3.35 MIL/uL — ABNORMAL LOW (ref 4.22–5.81)
RDW: 17.6 % — ABNORMAL HIGH (ref 11.5–15.5)
WBC Count: 4.8 10*3/uL (ref 4.0–10.5)
nRBC: 0 % (ref 0.0–0.2)

## 2023-02-04 LAB — MAGNESIUM: Magnesium: 2.1 mg/dL (ref 1.7–2.4)

## 2023-02-04 LAB — CEA (ACCESS): CEA (CHCC): 2.03 ng/mL (ref 0.00–5.00)

## 2023-02-04 MED ORDER — HEPARIN SOD (PORK) LOCK FLUSH 100 UNIT/ML IV SOLN
500.0000 [IU] | Freq: Once | INTRAVENOUS | Status: AC | PRN
  Administered 2023-02-04: 500 [IU]

## 2023-02-04 MED ORDER — SODIUM CHLORIDE 0.9 % IV SOLN
4.0000 mg/kg | Freq: Once | INTRAVENOUS | Status: AC
  Administered 2023-02-04: 400 mg via INTRAVENOUS
  Filled 2023-02-04: qty 20

## 2023-02-04 MED ORDER — SODIUM CHLORIDE 0.9 % IV SOLN: Freq: Once | INTRAVENOUS | Status: AC

## 2023-02-04 MED ORDER — SODIUM CHLORIDE 0.9% FLUSH
10.0000 mL | INTRAVENOUS | Status: DC | PRN
  Administered 2023-02-04: 10 mL

## 2023-02-04 NOTE — Patient Instructions (Signed)
[facility]   Discharge Instructions: Thank you for choosing [facility] to provide your oncology and hematology care.   If you have a lab appointment with the Cancer Center, please go directly to the Cancer Center and check in at the registration area.   Wear comfortable clothing and clothing appropriate for easy access to any Portacath or PICC line.   We strive to give you quality time with your provider. You may need to reschedule your appointment if you arrive late (15 or more minutes).  Arriving late affects you and other patients whose appointments are after yours.  Also, if you miss three or more appointments without notifying the office, you may be dismissed from the clinic at the provider's discretion.      For prescription refill requests, have your pharmacy contact our office and allow 72 hours for refills to be completed.    Today you received the following chemotherapy and/or immunotherapy agents Panitumumab (VECTIBIX).      To help prevent nausea and vomiting after your treatment, we encourage you to take your nausea medication as directed.  BELOW ARE SYMPTOMS THAT SHOULD BE REPORTED IMMEDIATELY: *FEVER GREATER THAN 100.4 F (38 C) OR HIGHER *CHILLS OR SWEATING *NAUSEA AND VOMITING THAT IS NOT CONTROLLED WITH YOUR NAUSEA MEDICATION *UNUSUAL SHORTNESS OF BREATH *UNUSUAL BRUISING OR BLEEDING *URINARY PROBLEMS (pain or burning when urinating, or frequent urination) *BOWEL PROBLEMS (unusual diarrhea, constipation, pain near the anus) TENDERNESS IN MOUTH AND THROAT WITH OR WITHOUT PRESENCE OF ULCERS (sore throat, sores in mouth, or a toothache) UNUSUAL RASH, SWELLING OR PAIN  UNUSUAL VAGINAL DISCHARGE OR ITCHING   Items with * indicate a potential emergency and should be followed up as soon as possible or go to the Emergency Department if any problems should occur.  Please show the CHEMOTHERAPY ALERT CARD or IMMUNOTHERAPY ALERT  CARD at check-in to the Emergency Department and triage nurse.  Should you have questions after your visit or need to cancel or reschedule your appointment, please contact [facility]  Dept: [phone]  and follow the prompts.  Office hours are 8:00 a.m. to 4:30 p.m. Monday - Friday. Please note that voicemails left after 4:00 p.m. may not be returned until the following business day.  We are closed weekends and major holidays. You have access to a nurse at all times for urgent questions. Please call the main number to the clinic Dept: [phone] and follow the prompts.   For any non-urgent questions, you may also contact your provider using MyChart. We now offer e-Visits for anyone 61 and older to request care online for non-urgent symptoms. For details visit [email or web address].   Also download the MyChart app! Go to the app store, search "MyChart", open the app, select [place], and log in with your MyChart username and password.  Panitumumab Injection What is this medication? PANITUMUMAB (pan i TOOM ue mab) treats colorectal cancer. It works by blocking a protein that causes cancer cells to grow and multiply. This helps to slow or stop the spread of cancer cells. It is a monoclonal antibody. This medicine may be used for other purposes; ask your health care provider or pharmacist if you have questions. COMMON BRAND NAME(S): Vectibix What should I tell my care team before I take this medication? They need to know if you have any of these conditions: Eye disease Low levels of magnesium in the blood Lung disease An unusual or allergic  reaction to panitumumab, other medications, foods, dyes, or preservatives Pregnant or trying to get pregnant Breast-feeding How should I use this medication? This medication is injected into a vein. It is given by your care team in a hospital or clinic setting. Talk to your care team about the use of this medication  in children. Special care may be needed. Overdosage: If you think you have taken too much of this medicine contact a poison control center or emergency room at once. NOTE: This medicine is only for you. Do not share this medicine with others. What if I miss a dose? Keep appointments for follow-up doses. It is important not to miss your dose. Call your care team if you are unable to keep an appointment. What may interact with this medication? Bevacizumab This list may not describe all possible interactions. Give your health care provider a list of all the medicines, herbs, non-prescription drugs, or dietary supplements you use. Also tell them if you smoke, drink alcohol, or use illegal drugs. Some items may interact with your medicine. What should I watch for while using this medication? Your condition will be monitored carefully while you are receiving this medication. This medication may make you feel generally unwell. This is not uncommon as chemotherapy can affect healthy cells as well as cancer cells. Report any side effects. Continue your course of treatment even though you feel ill unless your care team tells you to stop. This medication can make you more sensitive to the sun. Keep out of the sun while receiving this medication and for 2 months after stopping therapy. If you cannot avoid being in the sun, wear protective clothing and sunscreen. Do not use sun lamps, tanning beds, or tanning booths. Check with your care team if you have severe diarrhea, nausea, and vomiting or if you sweat a lot. The loss of too much body fluid may make it dangerous for you to take this medication. This medication may cause serious skin reactions. They can happen weeks to months after starting the medication. Contact your care team right away if you notice fevers or flu-like symptoms with a rash. The rash may be red or purple and then turn into blisters or peeling of the skin. You may also notice a red rash with  swelling of the face, lips, or lymph nodes in your neck or under your arms. Talk to your care team if you may be pregnant. Serious birth defects can occur if you take this medication during pregnancy and for 2 months after the last dose. Contraception is recommended while taking this medication and for 2 months after the last dose. Your care team can help you find the option that works for you. Do not breastfeed while taking this medication and for 2 months after the last dose. This medication may cause infertility. Talk to your care team if you are concerned about your fertility. What side effects may I notice from receiving this medication? Side effects that you should report to your care team as soon as possible: Allergic reactions--skin rash, itching, hives, swelling of the face, lips, tongue, or throat Dry cough, shortness of breath or trouble breathing Eye pain, redness, irritation, or discharge with blurry or decreased vision Infusion reactions--chest pain, shortness of breath or trouble breathing, feeling faint or lightheaded Low magnesium level--muscle pain or cramps, unusual weakness or fatigue, fast or irregular heartbeat, tremors Low potassium level--muscle pain or cramps, unusual weakness or fatigue, fast or irregular heartbeat, constipation Redness, blistering, peeling, or loosening  of the skin, including inside the mouth Skin reactions on sun-exposed areas Side effects that usually do not require medical attention (report to your care team if they continue or are bothersome): Change in nail shape, thickness, or color Diarrhea Dry skin Fatigue Nausea Vomiting This list may not describe all possible side effects. Call your doctor for medical advice about side effects. You may report side effects to FDA at 1-800-FDA-1088. Where should I keep my medication? This medication is given in a hospital or clinic. It will not be stored at home. NOTE: This sheet is a summary. It may not  cover all possible information. If you have questions about this medicine, talk to your doctor, pharmacist, or health care provider.  2024 Elsevier/Gold Standard (2021-09-04 00:00:00)

## 2023-02-04 NOTE — Progress Notes (Signed)
Patient seen by Lonna Cobb NP today  Vitals are within treatment parameters:Yes   Labs are within treatment parameters: No (Please specify and give further instructions.) Creatinine 2.34, its his normal level Per Misty Stanley Treatment plan has been signed: Yes   Per physician team, Patient is ready for treatment and there are NO modifications to the treatment plan.

## 2023-02-04 NOTE — Progress Notes (Signed)
[facility] OFFICE PROGRESS NOTE   Diagnosis: Colon cancer  INTERVAL HISTORY:   [name] returns as scheduled.  He continues encorafenib and Panitumumab.  Skin rash continues to be improved.  He tends to have mild nausea the day after Panitumumab.  He typically takes 1 or 2 doses of his home antiemetic and the nausea is relieved.  No diarrhea.  No mouth sores.  No change in numbness involving the feet.  Objective:  Vital signs in last 24 hours:  Blood pressure 138/64, pulse 67, temperature 97.8 F (36.6 C), temperature source Oral, resp. rate 16, height 6\' 4"  (1.93 m), weight 218 lb 1.6 oz (98.9 kg), SpO2 100%.    HEENT: No thrush or ulcers. Lymphatics: 1 to 1-1/2 cm mobile left axillary node.  4-5 similar firm left inguinal node. Resp: Lungs clear bilaterally. Cardio: Regular rate and rhythm. GI: No hepatosplenomegaly. Vascular: Trace bilateral pretibial edema. Skin: Acne type rash scattered over the face and trunk. Port-A-Cath without erythema.  Lab Results:  Lab Results  Component Value Date   WBC 4.8 02/04/2023   HGB 9.4 (L) 02/04/2023   HCT 30.3 (L) 02/04/2023   MCV 90.4 02/04/2023   PLT 163 02/04/2023   NEUTROABS 3.5 02/04/2023    Imaging:  No results found.  Medications: I have reviewed the patient's current medications.  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), isolated mesenteric implant-resected, multiple tumor deposits Sigmoid/descending resection and creation of a descending colostomy 04/10/2016 MSI-stable, no loss of mismatch repair protein expression Foundation 1- BRAF V600E positive, MS-stable, intermediate tumor mutation burden, no RAS mutation CT abdomen/pelvis 04/02/2016-no evidence of distant metastatic disease Cycle 1 FOLFOX 05/21/2016 Cycle 2 FOLFOX 06/04/2016 Cycle 3 FOLFOX 06/18/2016 Cycle 4 FOLFOX 07/02/2016 (oxaliplatin held secondary to thrombocytopenia) Cycle 5 FOLFOX 07/16/2016 Cycle 6 FOLFOX  07/30/2016 Cycle 7 FOLFOX 08/13/2016 (oxaliplatin held and 5-FU dose reduced) Cycle 8 FOLFOX 09/03/2016 (oxaliplatin held secondary to neuropathy) Cycle 9 FOLFOX 09/17/2016 (oxaliplatin held secondary to neuropathy) Cycle 10 FOLFOX 10/02/2016 Cycle 11 FOLFOX 10/15/2016 (oxaliplatin eliminated from the regimen) Cycle 12 FOLFOX 10/30/2016 (oxaliplatin eliminated) CTs 05/12/2017-no evidence of recurrent disease, right inguinal hernia containing bladder Colonoscopy 07/21/2017, 3 polyps were removed from the descending and transverse colon, fragments of tubular and tubulovillous adenoma CTs 05/19/2018-no evidence of recurrent disease, status post hernia repair, right upper lobe pneumonia CTs 05/20/2019-resolution of right upper lobe pneumonia, no evidence of metastatic disease Colonoscopy 01/25/2020-multiple polyps removed-tubular adenomas, poor preparation, repeat colonoscopy recommended CTs neck, chest, and abdomen/pelvis 08/27/2020- new 2 centimeter left axillary node, new 1.9 cm left inguinal node chronic mildly prominent portacaval node Ultrasound biopsy of left inguinal node on 10/29/2020-metastatic adenocarcinoma consistent with colon adenocarcinoma PET 11/12/2020-enlarged hypermetabolic left inguinal and left axillary nodes, solitary focus of hypermetabolic activity in the right lower quadrant small bowel Cycle 1 FOLFOX 12/19/2020 Cycle 2 FOLFOX 01/02/2021, oxaliplatin dose reduced secondary to neutropenia and thrombocytopenia Cycle 3 FOLFOX 01/16/2021 Cycle 4 FOLFOX 01/30/2021 CTs 02/11/2021-no change in left inguinal and left axillary nodes, no evidence of disease progression, stable wall thickening involving a loop of distal ileum Cycle 1 FOLFIRI/Avastin 02/27/2021 Cycle 2 FOLFIRI/Avastin 03/13/2021 Cycle 3 FOLFIRI/Avastin 04/03/2021 Cycle 4 FOLFIRI 04/17/2021, Avastin held due to elevated urine protein 05/07/2021-24-hour urine protein elevated 1562 Cycle 5 FOLFIRI 05/08/2021, Avastin held due to  elevated urine protein, irinotecan dose reduced due to in general not feeling well after treatment CTs 05/17/2021-left axillary and left inguinal lymph nodes are smaller, no evidence of disease progression Cycle 6 FOLFIRI  05/22/2021, Avastin held due to proteinuria 06/03/2021 24-hour urine with 1.9 g of protein Cycle 7 FOLFIRI 06/12/2021, Avastin held due to proteinuria Cycle 8 FOLFIRI 07/03/2021, Avastin held due to proteinuria Cycle 9 FOLFIRI 07/24/2021, Avastin held due to proteinuria Cycle 10 FOLFIRI 08/19/2021, Avastin held due to proteinuria CTs 09/02/2021-no change in the left axillary, left inguinal, and portacaval lymph nodes.  No mention of the distal small bowel thickening Maintenance 5-fluorouracil 09/18/2021 CT abdomen/pelvis 10/28/2021-enlarged left inguinal lymph node, stable; stomach and small bowel grossly unremarkable. Maintenance 5-fluorouracil continued Colonoscopy 12/10/2021-terminal ileum with an ulcerated partially obstructing medium-sized mass approximately 15 to 20 cm from the IC valve, invasive adenocarcinoma, colonic type, moderately differentiated (grade 2) PET scan 12/18/2021-progression of disease in the peritoneal space.  2 intensely hypermetabolic nodes in the left axilla, one node is new from the prior, the other is reduced in size and metabolic activity; decrease in size and metabolic activity of left inguinal adenopathy; intense metabolic activity associate with a loop of small bowel in the right lower quadrant, no interval change; new small hypermetabolic peritoneal implant along the ventral peritoneal surface just right of midline; larger new peritoneal implant in the deep right pelvis; small new implant in the right mid abdomen Encorafenib/Panitumumab 01/08/2022 CTs 03/17/2022-decrease size of mesenteric implant at the posterior bladder wall, small mesenteric peritoneal lesions identified on PET/CT or not evident.  Decrease size of left inguinal left axillary nodes, hypermetabolic  activity noted at the anterior left prostate on comparison August 2023 PET CTs 07/21/2022-decrease size of a left axillary lymph node and right pelvic implant.  Left inguinal lymph node measures smaller.  No new sites of disease identified.  Similar tiny pulmonary nodules. Encorafenib and Panitumumab continued, Panitumumab changed from a 2-week schedule to a 3-week schedule 07/23/2022 CT 10/30/2022-stable left axillary lymph node, no new or progressive metastatic disease in the chest; stable left inguinal lymphadenopathy; decrease in size of deep right peritoneal implant; no new metastatic disease in the abdomen/pelvis. encorafenib and Panitumumab continued Panitumumab dose reduced 11/25/2022 due to skin rash   2.   Chronic renal insufficiency   3.   Hypertension   4.  Inflamed sebaceous cyst at the upper back-status post incision and drainage   5.  Thrombocytopenia secondary chemotherapy-oxaliplatin dose reduced beginning with cycle 3 FOLFOX, oxaliplatin held with cycle 4 and cycle 7 FOLFOX   6.  Oxaliplatin neuropathy-improved   7.  History of Mucositis secondary chemotherapy   8.  Symptoms of pneumonia January 2020-CT 05/19/2018 consistent with right upper lobe pneumonia, Levaquin prescribed 05/20/2018   9.  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  Anemia, progressive 10/29/2021 2 units packed red blood cells 10/31/2021 Ferritin low 10/29/2021-oral iron Ferritin low 01/22/2022   12.  Colonoscopy 12/10/2021-terminal ileum with an ulcerated partially obstructing medium-sized mass approximately 15 to 20 cm from the IC valve, invasive adenocarcinoma, colonic type, moderately differentiated (grade 2) 13.  Admission 09/02/2022 with atrial flutter and rapid ventricular response 14.  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 appears stable.  He continues active treatment with encorafenib and Panitumumab.  There is no clinical evidence  of disease progression.  Skin rash continues to be improved.  Plan to proceed with treatment as above.  Restaging CTs prior to next office visit.  Bilateral foot numbness is unchanged.  Continue to monitor.  Plan is to discontinue encorafenib if symptoms worsen.  CBC and chemistry panel  reviewed.  Labs adequate to proceed as above.  Creatinine is stable.  He will return for follow-up and treatment in 4 weeks rather than 3 due to a planned trip out of town.  He will contact the office in the interim with any problems.  Lonna Cobb ANP/GNP-BC   02/04/2023  11:34 AM

## 2023-02-11 ENCOUNTER — Ambulatory Visit: Payer: Medicare Other | Admitting: Physical Therapy

## 2023-02-11 ENCOUNTER — Encounter: Payer: Self-pay | Admitting: Physical Therapy

## 2023-02-11 DIAGNOSIS — M6281 Muscle weakness (generalized): Secondary | ICD-10-CM | POA: Diagnosis not present

## 2023-02-11 DIAGNOSIS — R293 Abnormal posture: Secondary | ICD-10-CM | POA: Diagnosis not present

## 2023-02-11 DIAGNOSIS — G8929 Other chronic pain: Secondary | ICD-10-CM | POA: Diagnosis not present

## 2023-02-11 DIAGNOSIS — R262 Difficulty in walking, not elsewhere classified: Secondary | ICD-10-CM

## 2023-02-11 DIAGNOSIS — R252 Cramp and spasm: Secondary | ICD-10-CM

## 2023-02-11 DIAGNOSIS — M25561 Pain in right knee: Secondary | ICD-10-CM | POA: Diagnosis not present

## 2023-02-11 NOTE — Therapy (Signed)
OUTPATIENT PHYSICAL THERAPY LOWER EXTREMITY TREATMENT   Patient Name: Louis [name]. MRN: [id number] DOB:1945-08-17, 77 y.o., male Today's Date: 02/11/2023  END OF SESSION:  PT End of Session - 02/11/23 1541     Visit Number 2    Date for PT Re-Evaluation 03/31/23    Authorization Type Medicare    Progress Note Due on Visit 10    PT Start Time 1447    PT Stop Time 1530    PT Time Calculation (min) 43 min    Equipment Utilized During Treatment Gait belt    Activity Tolerance Patient tolerated treatment well    Behavior During Therapy WFL for tasks assessed/performed              Past Medical History:  Diagnosis Date   Allergy    seasonal allergies   Anemia    Arthritis 2010   Ascending aorta dilatation (HCC)    4.1 cm noted on CT1/15/21   Basal cell carcinoma 01/08/2023   right lower leg  needs mohs   Blood transfusion without reported diagnosis    CKD (chronic kidney disease) stage 2, GFR 60-89 ml/min    see [place], Stage 1   colon ca dx'd 04/2016   colon   Hernia, abdominal    sx fixed   Hyperlipidemia    on meds   Hypertension    on meds   LBBB (left bundle branch block) 08/18/2019   Neuropathy    Pneumonia 05/19/2018   Seasonal allergies    Thrombocytopenia (HCC)    during chemo.   Past Surgical History:  Procedure Laterality Date   A-FLUTTER ABLATION N/A 10/10/2022   Procedure: A-FLUTTER ABLATION;  Surgeon: Regan Lemming, MD;  Location: [facility] INVASIVE CV LAB;  Service: Cardiovascular;  Laterality: N/A;   BIOPSY  04/10/2016   Procedure: BIOPSY Mesentary;  Surgeon: Jimmye Norman, MD;  Location: [facility] OR;  Service: General;;   CARDIOVERSION N/A 09/04/2022   Procedure: CARDIOVERSION;  Surgeon: Sande Rives, MD;  Location: [facility] INVASIVE CV LAB;  Service: Cardiovascular;  Laterality: N/A;   COLONOSCOPY  2019   TA/tubulovillous adenoma   COLOSTOMY  2018   reversed   COLOSTOMY REVERSAL N/A 01/12/2017   Procedure: COLOSTOMY REVERSAL;   Surgeon: Jimmye Norman, MD;  Location: [facility] OR;  Service: General;  Laterality: N/A;   FLEXIBLE SIGMOIDOSCOPY N/A 04/09/2016   Procedure: FLEXIBLE SIGMOIDOSCOPY;  Surgeon: Meryl Dare, MD;  Location: [facility] ENDOSCOPY;  Service: Endoscopy;  Laterality: N/A;   HERNIA REPAIR     in childhood   INGUINAL HERNIA REPAIR Right 03/29/2018   Procedure: OPEN REPAIR OF RIGHT INGUINAL HERNIA WITH MESH;  Surgeon: Jimmye Norman, MD;  Location: [facility] OR;  Service: General;  Laterality: Right;   INSERTION OF MESH N/A 03/29/2018   Procedure: INSERTION OF MESH;  Surgeon: Jimmye Norman, MD;  Location: [facility] OR;  Service: General;  Laterality: N/A;   IR GENERIC HISTORICAL  05/14/2016   IR US GUIDE VASC ACCESS RIGHT 05/14/2016 Gilmer Mor, DO [facility]INTER[facility] RAD   IR GENERIC HISTORICAL  05/14/2016   IR FLUORO GUIDE PORT INSERTION RIGHT 05/14/2016 Gilmer Mor, DO [facility]   IR IMAGING GUIDED PORT INSERTION  12/17/2020   IR REMOVAL TUN ACCESS W/ PORT W/O FL MOD SED  11/11/2016   PARTIAL COLECTOMY N/A 04/10/2016   Procedure: SIGMOID  COLECTOMY WITH COLOSTOMY;  Surgeon: Jimmye Norman, MD;  Location: [facility] OR;  Service: General;  Laterality: N/A;   TEE WITHOUT CARDIOVERSION N/A 09/04/2022  Procedure: TRANSESOPHAGEAL ECHOCARDIOGRAM;  Surgeon: Sande Rives, MD;  Location: [facility] INVASIVE CV LAB;  Service: Cardiovascular;  Laterality: N/A;   TONSILLECTOMY     TOTAL KNEE ARTHROPLASTY Left 09/29/2019   Procedure: TOTAL KNEE ARTHROPLASTY;  Surgeon: Durene Romans, MD;  Location: [facility] ORS;  Service: Orthopedics;  Laterality: Left;  70 mins   Patient Active Problem List   Diagnosis Date Noted   Hypercoagulable state due to paroxysmal atrial fibrillation (HCC) 09/22/2022   Atrial flutter with rapid ventricular response (HCC) 09/03/2022   Acute congestive heart failure (HCC) 09/03/2022   Atrial flutter (HCC) 09/03/2022   Atrial fibrillation with RVR (HCC) 09/02/2022   CKD (chronic kidney disease), stage IV (HCC) 09/02/2022   Elevated troponin  09/02/2022   Anemia 09/02/2022   Metabolic acidosis 09/02/2022   SOB (shortness of breath) 09/02/2022   Leg edema, right 09/02/2022   Goals of care, counseling/discussion 12/05/2020   S/P left TKA 09/29/2019   Status post total left knee replacement 09/29/2019   Preoperative cardiovascular examination 08/18/2019   S/P right inguinal hernia repair 03/29/2018   New onset left bundle branch block (LBBB) 01/12/2018   Atherosclerotic vascular disease 01/12/2018   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   Port catheter in place 07/02/2016   Aorto-iliac atherosclerosis (HCC) 04/10/2016   Elevated fasting glucose 04/10/2016   Right inguinal hernia 04/10/2016   Adenocarcinoma of colon (HCC) 04/10/2016   Enterocolitis    Essential hypertension 04/04/2016   Acute kidney injury superimposed on chronic kidney disease Stage 2 (HCC) 04/04/2016   Ileus (HCC) 04/02/2016    PCP: [name], MD   REFERRING PROVIDER: [name], MD  REFERRING DIAG: [phone] (ICD-10-CM) - Pain in right knee  THERAPY DIAG:  Chronic pain of right knee  Difficulty in walking, not elsewhere classified  Cramp and spasm  Muscle weakness (generalized)  Abnormal posture  Rationale for Evaluation and Treatment: Rehabilitation  ONSET DATE: 02/02/2023  SUBJECTIVE:   SUBJECTIVE STATEMENT: Patient reports he is doing okay today. "I am here." Exercises were easy after the second day he added 5 extra reps.   Eval: Patient reports long hx of chemo due to his form of cancer.  He is currently on oral and IV chemo meds.  He also recently had cardiac ablation (June).  Has been in A-fib a couple of times since then.  The ablation was originally for Atrial flutter  PERTINENT HISTORY: Cancer : takes oral and I.V chemo PAIN:  Are you having pain? Yes: NPRS scale: 0 currently 5-6 when my knee acts up/10 Pain location: right knee Pain description: aching Aggravating factors: standing/walking Relieving  factors: rest  PRECAUTIONS: None  RED FLAGS: None   WEIGHT BEARING RESTRICTIONS: No  FALLS:  Has patient fallen in last 6 months? Yes. Number of falls 2  LIVING ENVIRONMENT: Lives with: lives with their spouse Lives in: House/apartment Stairs: Yes: External: 6 steps; on right going up Has following equipment at home: Single point cane  OCCUPATION: truck sales at [employer]   PLOF: Independent, Independent with basic ADLs, Independent with household mobility with device, Independent with community mobility with device, Independent with homemaking with ambulation, Independent with gait, Independent with transfers, Vocation/Vocational requirements: desk job, and Leisure: the beach once in a while, watches TV, can't do much else.  PATIENT GOALS: to stop falling and walk better, gain strength  NEXT MD VISIT: prn  OBJECTIVE:  Note: Objective measures were completed at Evaluation unless otherwise noted.  DIAGNOSTIC FINDINGS: na  PATIENT SURVEYS:  FOTO 33, predicted 55  COGNITION: Overall cognitive status: Within functional limits for tasks assessed     SENSATION: WFL   MUSCLE LENGTH: Hamstrings: Right 45 deg; Left 45 deg Thomas test: Right pos; Left pos  POSTURE: rounded shoulders, forward head, decreased lumbar lordosis, increased thoracic kyphosis, and posterior pelvic tilt  PALPATION: Mild crepitus bilateral knees  LOWER EXTREMITY ROM:  WFL bilaterally  LOWER EXTREMITY MMT:  Generally 3+/5 bilateral hips, 4-/5 bilateral knees and ankles  LOWER EXTREMITY SPECIAL TESTS:  Knee special tests: Patellafemoral apprehension test: negative and Patellafemoral grind test: positive   FUNCTIONAL TESTS:  5 times sit to stand: 32.50 sec Timed up and go (TUG): 20.33  GAIT: Distance walked: 30 Assistive device utilized: Single point cane Level of assistance: SBA Comments: short step length but consistent heel strike with both feet everted   TODAY'S TREATMENT:                                                                                                                               DATE:   02/11/2023 NuStep Level 2 ; 6 mins- PT present to discuss status Reviewed HEP; updated to include bridges, sit to stands and clamshells with added resistance Sit to stands 2x5 Bridges x10 Clamshells x10 each with red loop DGI: 14/24 Airex Step Ups and Side steps x10 Narrow BOS on airex x30 Modified tandem on airex 5-15 sec hold  02/03/23 Initial eval completed and initiated HEP    PATIENT EDUCATION:  Education details: Initial HEP, educated on anatomy of the knee and various areas/compartments for OA, importance of proper footwear, quad strength and hip strength for alignment Person educated: Patient Education method: Programmer, multimedia, Demonstration, Verbal cues, and Handouts Education comprehension: verbalized understanding, returned demonstration, and verbal cues required  HOME EXERCISE PROGRAM: Access Code: [email or web address] URL: [email or web address] Date: 02/11/2023 Prepared by: Claude Manges  Exercises - Supine Quadricep Sets  - 1 x daily - 7 x weekly - 1 sets - 20 reps - Supine Knee Extension Strengthening  - 1 x daily - 7 x weekly - 1 sets - 20 reps - Small Range Straight Leg Raise  - 1 x daily - 7 x weekly - 2 sets - 10 reps - Seated Long Arc Quad  - 1 x daily - 7 x weekly - 1 sets - 20 reps - Sit to Stand with Armchair  - 1 x daily - 7 x weekly - 2 sets - 5 reps - Supine Bridge  - 1 x daily - 7 x weekly - 1 sets - 10 reps - Clamshell with Resistance  - 1 x daily - 7 x weekly - 3 sets - 10 reps  ASSESSMENT:  CLINICAL IMPRESSION: Today's treatment session focused on balance and LE strengthening. Updated patient's HEP to include supine bridges, clamshells, and sit to stands. Administered a DGI and patient score 14/24 indicating an increased falls risk. Patient naturally walks with a slower gait speed when adding  head turns or stepping over objects  noted a marked decrease in walking speed. When performing modified tandem on airex Lt LE infront was harder than Rt LE in front; patient was only balance for 5 seconds before requiring UE support.  OBJECTIVE IMPAIRMENTS: decreased balance, decreased knowledge of condition, difficulty walking, decreased ROM, decreased strength, increased fascial restrictions, increased muscle spasms, impaired flexibility, postural dysfunction, and pain.   ACTIVITY LIMITATIONS: carrying, lifting, bending, sitting, standing, squatting, sleeping, stairs, transfers, bed mobility, toileting, and hygiene/grooming  PARTICIPATION LIMITATIONS: meal prep, cleaning, laundry, driving, shopping, community activity, occupation, yard work, and church  PERSONAL FACTORS: Fitness, Profession, Time since onset of injury/illness/exacerbation, and 3+ comorbidities: OA, HTN, Active cancer, atrial flutter  are also affecting patient's functional outcome.   REHAB POTENTIAL: Good  CLINICAL DECISION MAKING: Stable/uncomplicated  EVALUATION COMPLEXITY: Low   GOALS: Goals reviewed with patient? Yes  SHORT TERM GOALS: Target date: 03/03/2023  Patient will be independent with initial HEP  Baseline: Goal status: INITIAL  2.  Pain report to be no greater than 4/10  Baseline:  Goal status: INITIAL  3. Patient to report no falls in first 4 weeks of PT  Baseline: 2 falls  Goal status: INITIAL   LONG TERM GOALS: Target date: 03/31/2023   Patient to be independent with advanced HEP  Baseline:  Goal status: INITIAL  2.  Patient to report pain no greater than 2/10  Baseline:  Goal status: INITIAL  3.  Patient to be able to ascend and descend steps without pain or no greater than 2/10  Baseline:  Goal status: INITIAL  4.  Patient to be able to bend, stoop and squat with pain no greater than 2/10  Baseline:  Goal status: INITIAL  5.  Patient to report 85% improvement in overall symptoms Baseline:  Goal status:  INITIAL  6.  Patient to report increased confidence and reduced feeling of falling Baseline:  Goal status: INITIAL   PLAN:  PT FREQUENCY: 1-2x/week  PT DURATION: 8 weeks  PLANNED INTERVENTIONS: Therapeutic exercises, Therapeutic activity, Neuromuscular re-education, Balance training, Gait training, Patient/Family education, Self Care, Joint mobilization, Stair training, Orthotic/Fit training, DME instructions, Aquatic Therapy, Dry Needling, Electrical stimulation, Cryotherapy, Moist heat, Splintting, Taping, Vasopneumatic device, Ultrasound, Biofeedback, Ionotophoresis 4mg /ml Dexamethasone, Manual therapy, and Re-evaluation  PLAN FOR NEXT SESSION: 4 square step; step ups; cone taps; LE strengthening   Claude Manges, PT 02/11/23 3:43 PM

## 2023-02-12 ENCOUNTER — Ambulatory Visit: Payer: Medicare Other | Admitting: Physical Therapy

## 2023-02-12 ENCOUNTER — Encounter: Payer: Self-pay | Admitting: Physical Therapy

## 2023-02-12 DIAGNOSIS — R293 Abnormal posture: Secondary | ICD-10-CM

## 2023-02-12 DIAGNOSIS — G8929 Other chronic pain: Secondary | ICD-10-CM

## 2023-02-12 DIAGNOSIS — M25561 Pain in right knee: Secondary | ICD-10-CM | POA: Diagnosis not present

## 2023-02-12 DIAGNOSIS — R252 Cramp and spasm: Secondary | ICD-10-CM

## 2023-02-12 DIAGNOSIS — R262 Difficulty in walking, not elsewhere classified: Secondary | ICD-10-CM

## 2023-02-12 DIAGNOSIS — M6281 Muscle weakness (generalized): Secondary | ICD-10-CM | POA: Diagnosis not present

## 2023-02-12 NOTE — Therapy (Signed)
OUTPATIENT PHYSICAL THERAPY LOWER EXTREMITY TREATMENT   Patient Name: Louis [name]. MRN: [id number] DOB:[birth year], 77 y.o., male Today's Date: 02/12/2023  END OF SESSION:  PT End of Session - 02/12/23 1704     Visit Number 3    Date for PT Re-Evaluation 03/31/23    Authorization Type Medicare    Progress Note Due on Visit 10    PT Start Time 1617    PT Stop Time 1658    PT Time Calculation (min) 41 min    Activity Tolerance Patient tolerated treatment well    Behavior During Therapy [facility] for tasks assessed/performed               Past Medical History:  Diagnosis Date   Allergy    seasonal allergies   Anemia    Arthritis 2010   Ascending aorta dilatation (HCC)    4.1 cm noted on CT1/15/21   Basal cell carcinoma 01/08/2023   right lower leg  needs mohs   Blood transfusion without reported diagnosis    CKD (chronic kidney disease) stage 2, GFR 60-89 ml/min    see [place], Stage 1   colon ca dx'd 04/2016   colon   Hernia, abdominal    sx fixed   Hyperlipidemia    on meds   Hypertension    on meds   LBBB (left bundle branch block) 08/18/2019   Neuropathy    Pneumonia 05/19/2018   Seasonal allergies    Thrombocytopenia (HCC)    during chemo.   Past Surgical History:  Procedure Laterality Date   A-FLUTTER ABLATION N/A 10/10/2022   Procedure: A-FLUTTER ABLATION;  Surgeon: Regan Lemming, MD;  Location: [facility] INVASIVE CV LAB;  Service: Cardiovascular;  Laterality: N/A;   BIOPSY  04/10/2016   Procedure: BIOPSY Mesentary;  Surgeon: Jimmye Norman, MD;  Location: [facility] OR;  Service: General;;   CARDIOVERSION N/A 09/04/2022   Procedure: CARDIOVERSION;  Surgeon: Sande Rives, MD;  Location: [facility] INVASIVE CV LAB;  Service: Cardiovascular;  Laterality: N/A;   COLONOSCOPY  2019   TA/tubulovillous adenoma   COLOSTOMY  2018   reversed   COLOSTOMY REVERSAL N/A 01/12/2017   Procedure: COLOSTOMY REVERSAL;  Surgeon: Jimmye Norman, MD;  Location: [facility] OR;   Service: General;  Laterality: N/A;   FLEXIBLE SIGMOIDOSCOPY N/A 04/09/2016   Procedure: FLEXIBLE SIGMOIDOSCOPY;  Surgeon: Meryl Dare, MD;  Location: [facility] ENDOSCOPY;  Service: Endoscopy;  Laterality: N/A;   HERNIA REPAIR     in childhood   INGUINAL HERNIA REPAIR Right 03/29/2018   Procedure: OPEN REPAIR OF RIGHT INGUINAL HERNIA WITH MESH;  Surgeon: Jimmye Norman, MD;  Location: [facility] OR;  Service: General;  Laterality: Right;   INSERTION OF MESH N/A 03/29/2018   Procedure: INSERTION OF MESH;  Surgeon: Jimmye Norman, MD;  Location: [facility] OR;  Service: General;  Laterality: N/A;   IR GENERIC HISTORICAL  05/14/2016   IR US GUIDE VASC ACCESS RIGHT 05/14/2016 Gilmer Mor, DO [facility]-INTERV RAD   IR GENERIC HISTORICAL  05/14/2016   IR FLUORO GUIDE PORT INSERTION RIGHT 05/14/2016 Gilmer Mor, DO [facility]   IR IMAGING GUIDED PORT INSERTION  12/17/2020   IR REMOVAL TUN ACCESS W/ PORT W/O FL MOD SED  11/11/2016   PARTIAL COLECTOMY N/A 04/10/2016   Procedure: SIGMOID  COLECTOMY WITH COLOSTOMY;  Surgeon: Jimmye Norman, MD;  Location: [facility] OR;  Service: General;  Laterality: N/A;   TEE WITHOUT CARDIOVERSION N/A 09/04/2022   Procedure: TRANSESOPHAGEAL ECHOCARDIOGRAM;  Surgeon: O'Neal,  Ronnald Ramp, MD;  Location: [facility] INVASIVE CV LAB;  Service: Cardiovascular;  Laterality: N/A;   TONSILLECTOMY     TOTAL KNEE ARTHROPLASTY Left 09/29/2019   Procedure: TOTAL KNEE ARTHROPLASTY;  Surgeon: Durene Romans, MD;  Location: [facility] ORS;  Service: Orthopedics;  Laterality: Left;  70 mins   Patient Active Problem List   Diagnosis Date Noted   Hypercoagulable state due to paroxysmal atrial fibrillation (HCC) 09/22/2022   Atrial flutter with rapid ventricular response (HCC) 09/03/2022   Acute congestive heart failure (HCC) 09/03/2022   Atrial flutter (HCC) 09/03/2022   Atrial fibrillation with RVR (HCC) 09/02/2022   CKD (chronic kidney disease), stage IV (HCC) 09/02/2022   Elevated troponin 09/02/2022   Anemia 09/02/2022   Metabolic  acidosis 09/02/2022   SOB (shortness of breath) 09/02/2022   Leg edema, right 09/02/2022   Goals of care, counseling/discussion 12/05/2020   S/P left TKA 09/29/2019   Status post total left knee replacement 09/29/2019   Preoperative cardiovascular examination 08/18/2019   S/P right inguinal hernia repair 03/29/2018   New onset left bundle branch block (LBBB) 01/12/2018   Atherosclerotic vascular disease 01/12/2018   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   Port catheter in place 07/02/2016   Aorto-iliac atherosclerosis (HCC) 04/10/2016   Elevated fasting glucose 04/10/2016   Right inguinal hernia 04/10/2016   Adenocarcinoma of colon (HCC) 04/10/2016   Enterocolitis    Essential hypertension 04/04/2016   Acute kidney injury superimposed on chronic kidney disease Stage 2 (HCC) 04/04/2016   Ileus (HCC) 04/02/2016    PCP: [name], MD   REFERRING PROVIDER: [name], MD  REFERRING DIAG: [phone] (ICD-10-CM) - Pain in right knee  THERAPY DIAG:  Chronic pain of right knee  Abnormal posture  Difficulty in walking, not elsewhere classified  Cramp and spasm  Muscle weakness (generalized)  Rationale for Evaluation and Treatment: Rehabilitation  ONSET DATE: 02/02/2023  SUBJECTIVE:   SUBJECTIVE STATEMENT: Patient reports he is feels stiffness all over. He feels he worked muscles he has not worked before  [employer]: Patient reports long hx of chemo due to his form of cancer.  He is currently on oral and IV chemo meds.  He also recently had cardiac ablation (June).  Has been in A-fib a couple of times since then.  The ablation was originally for Atrial flutter  PERTINENT HISTORY: Cancer : takes oral and I.V chemo PAIN:  Are you having pain? Yes: NPRS scale: 0 currently 5-6 when my knee acts up/10 Pain location: right knee Pain description: aching Aggravating factors: standing/walking Relieving factors: rest  PRECAUTIONS: None  RED FLAGS: None   WEIGHT  BEARING RESTRICTIONS: No  FALLS:  Has patient fallen in last 6 months? Yes. Number of falls 2  LIVING ENVIRONMENT: Lives with: lives with their spouse Lives in: House/apartment Stairs: Yes: External: 6 steps; on right going up Has following equipment at home: Single point cane  OCCUPATION: truck sales at [employer]   PLOF: Independent, Independent with basic ADLs, Independent with household mobility with device, Independent with community mobility with device, Independent with homemaking with ambulation, Independent with gait, Independent with transfers, Vocation/Vocational requirements: desk job, and Leisure: the beach once in a while, watches TV, can't do much else.  PATIENT GOALS: to stop falling and walk better, gain strength  NEXT MD VISIT: prn  OBJECTIVE:  Note: Objective measures were completed at Evaluation unless otherwise noted.  DIAGNOSTIC FINDINGS: na  PATIENT SURVEYS:  FOTO 4, predicted 3  COGNITION: Overall cognitive status: Within  functional limits for tasks assessed     SENSATION: WFL   MUSCLE LENGTH: Hamstrings: Right 45 deg; Left 45 deg Thomas test: Right pos; Left pos  POSTURE: rounded shoulders, forward head, decreased lumbar lordosis, increased thoracic kyphosis, and posterior pelvic tilt  PALPATION: Mild crepitus bilateral knees  LOWER EXTREMITY ROM:  WFL bilaterally  LOWER EXTREMITY MMT:  Generally 3+/5 bilateral hips, 4-/5 bilateral knees and ankles  LOWER EXTREMITY SPECIAL TESTS:  Knee special tests: Patellafemoral apprehension test: negative and Patellafemoral grind test: positive   FUNCTIONAL TESTS:  5 times sit to stand: 32.50 sec Timed up and go (TUG): 20.33  GAIT: Distance walked: 30 Assistive device utilized: Single point cane Level of assistance: SBA Comments: short step length but consistent heel strike with both feet everted   TODAY'S TREATMENT:                                                                                                                               DATE:  10/910/2024 NuStep Level 3 ; 6 mins- PT present to discuss status Airex Step Ups & Side Steps in parallel bar x 10 each Cone Taps (4 cones infront and lateral) in parallel bar x 10 each Balance Beam 4 square in standing with str cane Step Taps 4 inch step 2 x 30 sec Balance Beam Forwards/ Backwards & side ways x5 each Hurdles (Forwards/ Sideways) x4  Obstacle Course( hurdle, step up, airex, cobble stone x2 ) in parallel bar x4 moderate UE support   02/11/2023 NuStep Level 2 ; 6 mins- PT present to discuss status Reviewed HEP; updated to include bridges, sit to stands and clamshells with added resistance Sit to stands 2x5 Bridges x10 Clamshells x10 each with red loop DGI: 14/24 Airex Step Ups and Side steps x10 Narrow BOS on airex x30 Modified tandem on airex 5-15 sec hold  02/03/23 Initial eval completed and initiated HEP    PATIENT EDUCATION:  Education details: Initial HEP, educated on anatomy of the knee and various areas/compartments for OA, importance of proper footwear, quad strength and hip strength for alignment Person educated: Patient Education method: Programmer, multimedia, Demonstration, Verbal cues, and Handouts Education comprehension: verbalized understanding, returned demonstration, and verbal cues required  HOME EXERCISE PROGRAM: Access Code: [email or web address] URL: [email or web address] Date: 02/11/2023 Prepared by: Claude Manges  Exercises - Supine Quadricep Sets  - 1 x daily - 7 x weekly - 1 sets - 20 reps - Supine Knee Extension Strengthening  - 1 x daily - 7 x weekly - 1 sets - 20 reps - Small Range Straight Leg Raise  - 1 x daily - 7 x weekly - 2 sets - 10 reps - Seated Long Arc Quad  - 1 x daily - 7 x weekly - 1 sets - 20 reps - Sit to Stand with Armchair  - 1 x daily - 7 x weekly - 2 sets - 5 reps - Supine Bridge  - 1 x daily -  7 x weekly - 1 sets - 10 reps - Clamshell with Resistance  - 1 x daily -  7 x weekly - 3 sets - 10 reps  ASSESSMENT:  CLINICAL IMPRESSION: Today's treatment session focused on dynamic balance. Incorporated balance obstacle course with a variety of surfaces to challenge patient's balance. The cobble stone surface was the most challenging for patient. He experienced moderate knee pain with lateral motions, but when taking smaller steps pain was alleviated. Educated patient on the three systems involved in balance and how we can challenge each system. Patient verbalized understanding. Patient will benefit from skilled PT to address the below impairments and improve overall function.   OBJECTIVE IMPAIRMENTS: decreased balance, decreased knowledge of condition, difficulty walking, decreased ROM, decreased strength, increased fascial restrictions, increased muscle spasms, impaired flexibility, postural dysfunction, and pain.   ACTIVITY LIMITATIONS: carrying, lifting, bending, sitting, standing, squatting, sleeping, stairs, transfers, bed mobility, toileting, and hygiene/grooming  PARTICIPATION LIMITATIONS: meal prep, cleaning, laundry, driving, shopping, community activity, occupation, yard work, and church  PERSONAL FACTORS: Fitness, Profession, Time since onset of injury/illness/exacerbation, and 3+ comorbidities: OA, HTN, Active cancer, atrial flutter  are also affecting patient's functional outcome.   REHAB POTENTIAL: Good  CLINICAL DECISION MAKING: Stable/uncomplicated  EVALUATION COMPLEXITY: Low   GOALS: Goals reviewed with patient? Yes  SHORT TERM GOALS: Target date: 03/03/2023  Patient will be independent with initial HEP  Baseline: Goal status: INITIAL  2.  Pain report to be no greater than 4/10  Baseline:  Goal status: INITIAL  3. Patient to report no falls in first 4 weeks of PT  Baseline: 2 falls  Goal status: INITIAL   LONG TERM GOALS: Target date: 03/31/2023   Patient to be independent with advanced HEP  Baseline:  Goal status:  INITIAL  2.  Patient to report pain no greater than 2/10  Baseline:  Goal status: INITIAL  3.  Patient to be able to ascend and descend steps without pain or no greater than 2/10  Baseline:  Goal status: INITIAL  4.  Patient to be able to bend, stoop and squat with pain no greater than 2/10  Baseline:  Goal status: INITIAL  5.  Patient to report 85% improvement in overall symptoms Baseline:  Goal status: INITIAL  6.  Patient to report increased confidence and reduced feeling of falling Baseline:  Goal status: INITIAL   PLAN:  PT FREQUENCY: 1-2x/week  PT DURATION: 8 weeks  PLANNED INTERVENTIONS: Therapeutic exercises, Therapeutic activity, Neuromuscular re-education, Balance training, Gait training, Patient/Family education, Self Care, Joint mobilization, Stair training, Orthotic/Fit training, DME instructions, Aquatic Therapy, Dry Needling, Electrical stimulation, Cryotherapy, Moist heat, Splintting, Taping, Vasopneumatic device, Ultrasound, Biofeedback, Ionotophoresis 4mg /ml Dexamethasone, Manual therapy, and Re-evaluation  PLAN FOR NEXT SESSION: continue incorporating cobblestone; emphasize bigger steps with gait   Claude Manges, PT 02/12/23 5:06 PM

## 2023-02-18 ENCOUNTER — Encounter: Payer: Self-pay | Admitting: Physical Therapy

## 2023-02-18 ENCOUNTER — Ambulatory Visit: Payer: Medicare Other | Admitting: Physical Therapy

## 2023-02-18 DIAGNOSIS — M6281 Muscle weakness (generalized): Secondary | ICD-10-CM

## 2023-02-18 DIAGNOSIS — R293 Abnormal posture: Secondary | ICD-10-CM

## 2023-02-18 DIAGNOSIS — M25561 Pain in right knee: Secondary | ICD-10-CM | POA: Diagnosis not present

## 2023-02-18 DIAGNOSIS — R262 Difficulty in walking, not elsewhere classified: Secondary | ICD-10-CM | POA: Diagnosis not present

## 2023-02-18 DIAGNOSIS — R252 Cramp and spasm: Secondary | ICD-10-CM

## 2023-02-18 DIAGNOSIS — G8929 Other chronic pain: Secondary | ICD-10-CM

## 2023-02-18 NOTE — Therapy (Addendum)
OUTPATIENT PHYSICAL THERAPY LOWER EXTREMITY TREATMENT   Patient Name: Louis [name]. MRN: [id number] DOB:23-Jun-1945, 77 y.o., male Today's Date: 02/18/2023  END OF SESSION:  PT End of Session - 02/18/23 1543     Visit Number 4    Date for PT Re-Evaluation 03/31/23    Authorization Type Medicare    Progress Note Due on Visit 10    PT Start Time 1449    PT Stop Time 1530    PT Time Calculation (min) 41 min    Activity Tolerance Patient tolerated treatment well    Behavior During Therapy [facility] for tasks assessed/performed                Past Medical History:  Diagnosis Date   Allergy    seasonal allergies   Anemia    Arthritis 2010   Ascending aorta dilatation (HCC)    4.1 cm noted on CT1/15/21   Basal cell carcinoma 01/08/2023   right lower leg  needs mohs   Blood transfusion without reported diagnosis    CKD (chronic kidney disease) stage 2, GFR 60-89 ml/min    see [place], Stage 1   colon ca dx'd 04/2016   colon   Hernia, abdominal    sx fixed   Hyperlipidemia    on meds   Hypertension    on meds   LBBB (left bundle branch block) 08/18/2019   Neuropathy    Pneumonia 05/19/2018   Seasonal allergies    Thrombocytopenia (HCC)    during chemo.   Past Surgical History:  Procedure Laterality Date   A-FLUTTER ABLATION N/A 10/10/2022   Procedure: A-FLUTTER ABLATION;  Surgeon: Regan Lemming, MD;  Location: [facility] INVASIVE CV LAB;  Service: Cardiovascular;  Laterality: N/A;   BIOPSY  04/10/2016   Procedure: BIOPSY Mesentary;  Surgeon: Jimmye Norman, MD;  Location: [facility] OR;  Service: General;;   CARDIOVERSION N/A 09/04/2022   Procedure: CARDIOVERSION;  Surgeon: Sande Rives, MD;  Location: [facility] INVASIVE CV LAB;  Service: Cardiovascular;  Laterality: N/A;   COLONOSCOPY  2019   TA/tubulovillous adenoma   COLOSTOMY  2018   reversed   COLOSTOMY REVERSAL N/A 01/12/2017   Procedure: COLOSTOMY REVERSAL;  Surgeon: Jimmye Norman, MD;  Location: [facility] OR;   Service: General;  Laterality: N/A;   FLEXIBLE SIGMOIDOSCOPY N/A 04/09/2016   Procedure: FLEXIBLE SIGMOIDOSCOPY;  Surgeon: Meryl Dare, MD;  Location: [facility] ENDOSCOPY;  Service: Endoscopy;  Laterality: N/A;   HERNIA REPAIR     in childhood   INGUINAL HERNIA REPAIR Right 03/29/2018   Procedure: OPEN REPAIR OF RIGHT INGUINAL HERNIA WITH MESH;  Surgeon: Jimmye Norman, MD;  Location: [facility] OR;  Service: General;  Laterality: Right;   INSERTION OF MESH N/A 03/29/2018   Procedure: INSERTION OF MESH;  Surgeon: Jimmye Norman, MD;  Location: [facility] OR;  Service: General;  Laterality: N/A;   IR GENERIC HISTORICAL  05/14/2016   IR US GUIDE VASC ACCESS RIGHT 05/14/2016 Gilmer Mor, DO [facility]-INTERV RAD   IR GENERIC HISTORICAL  05/14/2016   IR FLUORO GUIDE PORT INSERTION RIGHT 05/14/2016 Gilmer Mor, DO [facility]INTER[facility]   IR IMAGING GUIDED PORT INSERTION  12/17/2020   IR REMOVAL TUN ACCESS W/ PORT W/O FL MOD SED  11/11/2016   PARTIAL COLECTOMY N/A 04/10/2016   Procedure: SIGMOID  COLECTOMY WITH COLOSTOMY;  Surgeon: Jimmye Norman, MD;  Location: [facility] OR;  Service: General;  Laterality: N/A;   TEE WITHOUT CARDIOVERSION N/A 09/04/2022   Procedure: TRANSESOPHAGEAL ECHOCARDIOGRAM;  Surgeon:  Sande Rives, MD;  [facility] INVASIVE CV LAB;  Service: Cardiovascular;  Laterality: N/A;   TONSILLECTOMY     TOTAL KNEE ARTHROPLASTY Left 09/29/2019   Procedure: TOTAL KNEE ARTHROPLASTY;  Surgeon: Durene Romans, MD;  Location: [facility] ORS;  Service: Orthopedics;  Laterality: Left;  70 mins   Patient Active Problem List   Diagnosis Date Noted   Hypercoagulable state due to paroxysmal atrial fibrillation (HCC) 09/22/2022   Atrial flutter with rapid ventricular response (HCC) 09/03/2022   Acute congestive heart failure (HCC) 09/03/2022   Atrial flutter (HCC) 09/03/2022   Atrial fibrillation with RVR (HCC) 09/02/2022   CKD (chronic kidney disease), stage IV (HCC) 09/02/2022   Elevated troponin 09/02/2022   Anemia 09/02/2022   Metabolic  acidosis 09/02/2022   SOB (shortness of breath) 09/02/2022   Leg edema, right 09/02/2022   Goals of care, counseling/discussion 12/05/2020   S/P left TKA 09/29/2019   Status post total left knee replacement 09/29/2019   Preoperative cardiovascular examination 08/18/2019   S/P right inguinal hernia repair 03/29/2018   New onset left bundle branch block (LBBB) 01/12/2018   Atherosclerotic vascular disease 01/12/2018   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   Port catheter in place 07/02/2016   Aorto-iliac atherosclerosis (HCC) 04/10/2016   Elevated fasting glucose 04/10/2016   Right inguinal hernia 04/10/2016   Adenocarcinoma of colon (HCC) 04/10/2016   Enterocolitis    Essential hypertension 04/04/2016   Acute kidney injury superimposed on chronic kidney disease Stage 2 (HCC) 04/04/2016   Ileus (HCC) 04/02/2016    PCP: [name], MD   REFERRING PROVIDER: [name], MD  REFERRING DIAG: [phone] (ICD-10-CM) - Pain in right knee  THERAPY DIAG:  Chronic pain of right knee  Abnormal posture  Difficulty in walking, not elsewhere classified  Cramp and spasm  Muscle weakness (generalized)  Rationale for Evaluation and Treatment: Rehabilitation  ONSET DATE: 02/02/2023  SUBJECTIVE:   SUBJECTIVE STATEMENT: Patient reports he is doing okay today. He was sore for two days after last treatment session. He has been compliant with his HEP.  Eval: Patient reports long hx of chemo due to his form of cancer.  He is currently on oral and IV chemo meds.  He also recently had cardiac ablation (June).  Has been in A-fib a couple of times since then.  The ablation was originally for Atrial flutter  PERTINENT HISTORY: Cancer : takes oral and I.V chemo PAIN:  Are you having pain? Yes: NPRS scale: 0 currently 5-6 when my knee acts up/10 Pain location: right knee Pain description: aching Aggravating factors: standing/walking Relieving factors: rest  PRECAUTIONS:  None  RED FLAGS: None   WEIGHT BEARING RESTRICTIONS: No  FALLS:  Has patient fallen in last 6 months? Yes. Number of falls 2  LIVING ENVIRONMENT: Lives with: lives with their spouse Lives in: House/apartment Stairs: Yes: External: 6 steps; on right going up Has following equipment at home: Single point cane  OCCUPATION: truck sales at [employer]   PLOF: Independent, Independent with basic ADLs, Independent with household mobility with device, Independent with community mobility with device, Independent with homemaking with ambulation, Independent with gait, Independent with transfers, Vocation/Vocational requirements: desk job, and Leisure: the beach once in a while, watches TV, can't do much else.  PATIENT GOALS: to stop falling and walk better, gain strength  NEXT MD VISIT: prn  OBJECTIVE:  Note: Objective measures were completed at Evaluation unless otherwise noted.  DIAGNOSTIC FINDINGS: na  PATIENT SURVEYS:  FOTO 33, predicted  55  COGNITION: Overall cognitive status: Within functional limits for tasks assessed     SENSATION: WFL   MUSCLE LENGTH: Hamstrings: Right 45 deg; Left 45 deg Thomas test: Right pos; Left pos  POSTURE: rounded shoulders, forward head, decreased lumbar lordosis, increased thoracic kyphosis, and posterior pelvic tilt  PALPATION: Mild crepitus bilateral knees  LOWER EXTREMITY ROM:  WFL bilaterally  LOWER EXTREMITY MMT:  Generally 3+/5 bilateral hips, 4-/5 bilateral knees and ankles  LOWER EXTREMITY SPECIAL TESTS:  Knee special tests: Patellafemoral apprehension test: negative and Patellafemoral grind test: positive   FUNCTIONAL TESTS:  5 times sit to stand: 32.50 sec Timed up and go (TUG): 20.33  GAIT: Distance walked: 30 Assistive device utilized: Single point cane Level of assistance: SBA Comments: short step length but consistent heel strike with both feet everted   TODAY'S TREATMENT:                                                                                                                               DATE:  02/18/2023 NuStep Level 4 ; 6 mins- PT present to discuss status Cone Retrieval (varying heights around gym)  Hurdles (Forwards/ Sideways) x 4 Step Ups 4 inches x 10; only able to do 5 on Rt knee due to pain Sit to Stands with UE support only able to do 5 due to knee pain Cone taps standing on airex x 10 Hip series on airex (hip abduction, hip flexion) x 10 each Walking around gym with verbal cues to increase step length Dynadisk step downs x10 on Lt ; 3 on Rt  Attempted 2 inch step downs with Rt but patient was unable to perform due to knee pain Obstacle course (2 cobblestone 2 hurdles) in parallel bar   02/12/2023 NuStep Level 3 ; 6 mins- PT present to discuss status Airex Step Ups & Side Steps in parallel bar x 10 each Cone Taps (4 cones infront and lateral) in parallel bar x 10 each Balance Beam 4 square in standing with str cane Step Taps 4 inch step 2 x 30 sec Balance Beam Forwards/ Backwards & side ways x5 each Hurdles (Forwards/ Sideways) x4  Obstacle Course( hurdle, step up, airex, cobble stone x2 ) in parallel bar x4 moderate UE support   02/11/2023 NuStep Level 2 ; 6 mins- PT present to discuss status Reviewed HEP; updated to include bridges, sit to stands and clamshells with added resistance Sit to stands 2x5 Bridges x10 Clamshells x10 each with red loop DGI: 14/24 Airex Step Ups and Side steps x10 Narrow BOS on airex x30 Modified tandem on airex 5-15 sec hold  02/03/23 Initial eval completed and initiated HEP    PATIENT EDUCATION:  Education details: Initial HEP, educated on anatomy of the knee and various areas/compartments for OA, importance of proper footwear, quad strength and hip strength for alignment Person educated: Patient Education method: Explanation, Demonstration, Verbal cues, and Handouts Education comprehension: verbalized understanding, returned  demonstration, and verbal cues required  HOME EXERCISE PROGRAM: Access Code: [email or web address] URL: [email or web address] Date: 02/11/2023 Prepared by: Claude Manges  Exercises - Supine Quadricep Sets  - 1 x daily - 7 x weekly - 1 sets - 20 reps - Supine Knee Extension Strengthening  - 1 x daily - 7 x weekly - 1 sets - 20 reps - Small Range Straight Leg Raise  - 1 x daily - 7 x weekly - 2 sets - 10 reps - Seated Long Arc Quad  - 1 x daily - 7 x weekly - 1 sets - 20 reps - Sit to Stand with Armchair  - 1 x daily - 7 x weekly - 2 sets - 5 reps - Supine Bridge  - 1 x daily - 7 x weekly - 1 sets - 10 reps - Clamshell with Resistance  - 1 x daily - 7 x weekly - 3 sets - 10 reps  ASSESSMENT:  CLINICAL IMPRESSION: Today's treatment session focused on dynamic balance. Incorporated cone retrieval task to encourage visual tracking and reaching for objects. Patient performed task safety and required minimal verbal cues. Patient's Rt knee pain was a limiting factor for today's session. He was not able to do many repetitions when having to put full weight on Rt knee. Incorporated gait training to encourage increased step length. Noted mild improvement in step length. Patient will benefit from skilled PT to address the below impairments and improve overall function.    OBJECTIVE IMPAIRMENTS: decreased balance, decreased knowledge of condition, difficulty walking, decreased ROM, decreased strength, increased fascial restrictions, increased muscle spasms, impaired flexibility, postural dysfunction, and pain.   ACTIVITY LIMITATIONS: carrying, lifting, bending, sitting, standing, squatting, sleeping, stairs, transfers, bed mobility, toileting, and hygiene/grooming  PARTICIPATION LIMITATIONS: meal prep, cleaning, laundry, driving, shopping, community activity, occupation, yard work, and church  PERSONAL FACTORS: Fitness, Profession, Time since onset of injury/illness/exacerbation, and 3+ comorbidities:  OA, HTN, Active cancer, atrial flutter  are also affecting patient's functional outcome.   REHAB POTENTIAL: Good  CLINICAL DECISION MAKING: Stable/uncomplicated  EVALUATION COMPLEXITY: Low   GOALS: Goals reviewed with patient? Yes  SHORT TERM GOALS: Target date: 03/03/2023  Patient will be independent with initial HEP  Baseline: Goal status: INITIAL  2.  Pain report to be no greater than 4/10  Baseline:  Goal status: INITIAL  3. Patient to report no falls in first 4 weeks of PT  Baseline: 2 falls  Goal status: INITIAL   LONG TERM GOALS: Target date: 03/31/2023   Patient to be independent with advanced HEP  Baseline:  Goal status: INITIAL  2.  Patient to report pain no greater than 2/10  Baseline:  Goal status: INITIAL  3.  Patient to be able to ascend and descend steps without pain or no greater than 2/10  Baseline:  Goal status: INITIAL  4.  Patient to be able to bend, stoop and squat with pain no greater than 2/10  Baseline:  Goal status: INITIAL  5.  Patient to report 85% improvement in overall symptoms Baseline:  Goal status: INITIAL  6.  Patient to report increased confidence and reduced feeling of falling Baseline:  Goal status: INITIAL   PLAN:  PT FREQUENCY: 1-2x/week  PT DURATION: 8 weeks  PLANNED INTERVENTIONS: Therapeutic exercises, Therapeutic activity, Neuromuscular re-education, Balance training, Gait training, Patient/Family education, Self Care, Joint mobilization, Stair training, Orthotic/Fit training, DME instructions, Aquatic Therapy, Dry Needling, Electrical stimulation, Cryotherapy, Moist heat, Splintting, Taping, Vasopneumatic device, Ultrasound, Biofeedback, Ionotophoresis 4mg /ml Dexamethasone, Manual therapy,  and Re-evaluation  PLAN FOR NEXT SESSION: leg press, walking with head turns   Claude Manges, PT 02/18/23 3:44 PM

## 2023-02-19 ENCOUNTER — Ambulatory Visit: Payer: Medicare Other | Admitting: Physical Therapy

## 2023-02-19 NOTE — Therapy (Deleted)
OUTPATIENT PHYSICAL THERAPY LOWER EXTREMITY TREATMENT   Patient Name: Louis [name]. MRN: [id number] DOB:1945-08-21, 77 y.o., male Today's Date: 02/19/2023  END OF SESSION:       Past Medical History:  Diagnosis Date   Allergy    seasonal allergies   Anemia    Arthritis 2010   Ascending aorta dilatation (HCC)    4.1 cm noted on CT1/15/21   Basal cell carcinoma 01/08/2023   right lower leg  needs mohs   Blood transfusion without reported diagnosis    CKD (chronic kidney disease) stage 2, GFR 60-89 ml/min    see [place], Stage 1   colon ca dx'd 04/2016   colon   Hernia, abdominal    sx fixed   Hyperlipidemia    on meds   Hypertension    on meds   LBBB (left bundle branch block) 08/18/2019   Neuropathy    Pneumonia 05/19/2018   Seasonal allergies    Thrombocytopenia (HCC)    during chemo.   Past Surgical History:  Procedure Laterality Date   A-FLUTTER ABLATION N/A 10/10/2022   Procedure: A-FLUTTER ABLATION;  Surgeon: Regan Lemming, MD;  Location: [facility] INVASIVE CV LAB;  Service: Cardiovascular;  Laterality: N/A;   BIOPSY  04/10/2016   Procedure: BIOPSY Mesentary;  Surgeon: Jimmye Norman, MD;  Location: [facility] OR;  Service: General;;   CARDIOVERSION N/A 09/04/2022   Procedure: CARDIOVERSION;  Surgeon: Sande Rives, MD;  Location: [facility] INVASIVE CV LAB;  Service: Cardiovascular;  Laterality: N/A;   COLONOSCOPY  2019   TA/tubulovillous adenoma   COLOSTOMY  2018   reversed   COLOSTOMY REVERSAL N/A 01/12/2017   Procedure: COLOSTOMY REVERSAL;  Surgeon: Jimmye Norman, MD;  Location: [facility] OR;  Service: General;  Laterality: N/A;   FLEXIBLE SIGMOIDOSCOPY N/A 04/09/2016   Procedure: FLEXIBLE SIGMOIDOSCOPY;  Surgeon: Meryl Dare, MD;  Location: [facility] ENDOSCOPY;  Service: Endoscopy;  Laterality: N/A;   HERNIA REPAIR     in childhood   INGUINAL HERNIA REPAIR Right 03/29/2018   Procedure: OPEN REPAIR OF RIGHT INGUINAL HERNIA WITH MESH;  Surgeon: Jimmye Norman, MD;   Location: [facility] OR;  Service: General;  Laterality: Right;   INSERTION OF MESH N/A 03/29/2018   Procedure: INSERTION OF MESH;  Surgeon: Jimmye Norman, MD;  Location: [facility] OR;  Service: General;  Laterality: N/A;   IR GENERIC HISTORICAL  05/14/2016   IR US GUIDE VASC ACCESS RIGHT 05/14/2016 Gilmer Mor, DO [facility]-INTERV RAD   IR GENERIC HISTORICAL  05/14/2016   IR FLUORO GUIDE PORT INSERTION RIGHT 05/14/2016 Gilmer Mor, DO [facility]   IR IMAGING GUIDED PORT INSERTION  12/17/2020   IR REMOVAL TUN ACCESS W/ PORT W/O FL MOD SED  11/11/2016   PARTIAL COLECTOMY N/A 04/10/2016   Procedure: SIGMOID  COLECTOMY WITH COLOSTOMY;  Surgeon: Jimmye Norman, MD;  Location: [facility] OR;  Service: General;  Laterality: N/A;   TEE WITHOUT CARDIOVERSION N/A 09/04/2022   Procedure: TRANSESOPHAGEAL ECHOCARDIOGRAM;  Surgeon: Sande Rives, MD;  Location: [facility] INVASIVE CV LAB;  Service: Cardiovascular;  Laterality: N/A;   TONSILLECTOMY     TOTAL KNEE ARTHROPLASTY Left 09/29/2019   Procedure: TOTAL KNEE ARTHROPLASTY;  Surgeon: Durene Romans, MD;  Location: [facility] ORS;  Service: Orthopedics;  Laterality: Left;  70 mins   Patient Active Problem List   Diagnosis Date Noted   Hypercoagulable state due to paroxysmal atrial fibrillation (HCC) 09/22/2022   Atrial flutter with rapid ventricular response (HCC) 09/03/2022   Acute congestive heart  failure (HCC) 09/03/2022   Atrial flutter (HCC) 09/03/2022   Atrial fibrillation with RVR (HCC) 09/02/2022   CKD (chronic kidney disease), stage IV (HCC) 09/02/2022   Elevated troponin 09/02/2022   Anemia 09/02/2022   Metabolic acidosis 09/02/2022   SOB (shortness of breath) 09/02/2022   Leg edema, right 09/02/2022   Goals of care, counseling/discussion 12/05/2020   S/P left TKA 09/29/2019   Status post total left knee replacement 09/29/2019   Preoperative cardiovascular examination 08/18/2019   S/P right inguinal hernia repair 03/29/2018   New onset left bundle branch block (LBBB) 01/12/2018    Atherosclerotic vascular disease 01/12/2018   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  Rationale for Evaluation and Treatment: Rehabilitation  ONSET DATE: 02/02/2023  SUBJECTIVE:   SUBJECTIVE STATEMENT: Patient reports he is doing okay today. He was sore for two days after last treatment session. He has been compliant with his HEP.  Eval: Patient reports long hx of chemo due to his form of cancer.  He is currently on oral and IV chemo meds.  He also recently had cardiac ablation (June).  Has been in A-fib a couple of times since then.  The ablation was originally for Atrial flutter  PERTINENT HISTORY: Cancer : takes oral and I.V chemo PAIN:  Are you having pain? Yes: NPRS scale: 0 currently 5-6 when my knee acts up/10 Pain location: right knee Pain description: aching Aggravating factors: standing/walking Relieving factors: rest  PRECAUTIONS: None  RED FLAGS: None   WEIGHT BEARING RESTRICTIONS: No  FALLS:  Has patient fallen in last 6 months? Yes. Number of falls 2  LIVING ENVIRONMENT: Lives with: lives with their spouse Lives in: House/apartment Stairs: Yes: External: 6 steps; on right going up Has following equipment at home: Single point cane  OCCUPATION: truck sales at [employer]   PLOF: Independent, Independent with basic ADLs, Independent with household mobility with device, Independent with community mobility with device,  Independent with homemaking with ambulation, Independent with gait, Independent with transfers, Vocation/Vocational requirements: desk job, and Leisure: the beach once in a while, watches TV, can't do much else.  PATIENT GOALS: to stop falling and walk better, gain strength  NEXT MD VISIT: prn  OBJECTIVE:  Note: Objective measures were completed at Evaluation unless otherwise noted.  DIAGNOSTIC FINDINGS: na  PATIENT SURVEYS:  FOTO 33, predicted 88  COGNITION: Overall cognitive status: Within functional limits for tasks assessed     SENSATION: WFL   MUSCLE LENGTH: Hamstrings: Right 45 deg; Left 45 deg Thomas test: Right pos; Left pos  POSTURE: rounded shoulders, forward head, decreased lumbar lordosis, increased thoracic kyphosis, and posterior pelvic tilt  PALPATION: Mild crepitus bilateral knees  LOWER EXTREMITY ROM:  WFL bilaterally  LOWER EXTREMITY MMT:  Generally 3+/5 bilateral hips, 4-/5 bilateral knees and ankles  LOWER EXTREMITY SPECIAL TESTS:  Knee special tests: Patellafemoral apprehension test: negative and Patellafemoral grind test: positive   FUNCTIONAL TESTS:  5 times sit to stand: 32.50 sec Timed  up and go (TUG): 20.33  GAIT: Distance walked: 30 Assistive device utilized: Single point cane Level of assistance: SBA Comments: short step length but consistent heel strike with both feet everted   TODAY'S TREATMENT:                                                                                                                              DATE:  02/12/2023 NuStep Level 4 ; 6 mins- PT present to discuss status Cone Retrieval (varying heights around gym)  Hurdles (Forwards/ Sideways) x 4 Step Ups 4 inches x 10; only able to do 5 on Rt knee due to pain Sit to Stands with UE support only able to do 5 due to knee pain Cone taps standing on airex x 10 Hip series on airex (hip abduction, hip flexion) x 10 each Walking around gym with verbal cues to  increase step length Dynadisk step downs x10 on Lt ; 3 on Rt  Attempted 2 inch step downs with Rt but patient was unable to perform due to knee pain Obstacle course (2 cobblestone 2 hurdles) in parallel bar  Airex Step Ups & Side Steps in parallel bar x 10 each Cone Taps (4 cones infront and lateral) in parallel bar x 10 each Balance Beam 4 square in standing with str cane Step Taps 4 inch step 2 x 30 sec Balance Beam Forwards/ Backwards & side ways x5 each Hurdles (Forwards/ Sideways) x4  Obstacle Course( hurdle, step up, airex, cobble stone x2 ) in parallel bar x4 moderate UE support    02/12/2023 NuStep Level 3 ; 6 mins- PT present to discuss status Airex Step Ups & Side Steps in parallel bar x 10 each Cone Taps (4 cones infront and lateral) in parallel bar x 10 each Balance Beam 4 square in standing with str cane Step Taps 4 inch step 2 x 30 sec Balance Beam Forwards/ Backwards & side ways x5 each Hurdles (Forwards/ Sideways) x4  Obstacle Course( hurdle, step up, airex, cobble stone x2 ) in parallel bar x4 moderate UE support   02/11/2023 NuStep Level 2 ; 6 mins- PT present to discuss status Reviewed HEP; updated to include bridges, sit to stands and clamshells with added resistance Sit to stands 2x5 Bridges x10 Clamshells x10 each with red loop DGI: 14/24 Airex Step Ups and Side steps x10 Narrow BOS on airex x30 Modified tandem on airex 5-15 sec hold  02/03/23 Initial eval completed and initiated HEP    PATIENT EDUCATION:  Education details: Initial HEP, educated on anatomy of the knee and various areas/compartments for OA, importance of proper footwear, quad strength and hip strength for alignment Person educated: Patient Education method: Programmer, multimedia, Demonstration, Verbal cues, and Handouts Education comprehension: verbalized understanding, returned demonstration, and verbal cues required  HOME EXERCISE PROGRAM: Access Code: [email or web address] URL:  [email or web address] Date: 02/11/2023 Prepared by: Claude Manges  Exercises - Supine Quadricep Sets  - 1 x daily - 7  x weekly - 1 sets - 20 reps - Supine Knee Extension Strengthening  - 1 x daily - 7 x weekly - 1 sets - 20 reps - Small Range Straight Leg Raise  - 1 x daily - 7 x weekly - 2 sets - 10 reps - Seated Long Arc Quad  - 1 x daily - 7 x weekly - 1 sets - 20 reps - Sit to Stand with Armchair  - 1 x daily - 7 x weekly - 2 sets - 5 reps - Supine Bridge  - 1 x daily - 7 x weekly - 1 sets - 10 reps - Clamshell with Resistance  - 1 x daily - 7 x weekly - 3 sets - 10 reps  ASSESSMENT:  CLINICAL IMPRESSION: Today's treatment session focused on dynamic balance. Incorporated cone retrieval task to encourage visual tracking and reaching for objects. Patient performed task safety and required minimal verbal cues. Patient's Rt knee pain was a limiting factor for today's session. He was not able to do many repetitions when having to put full weight on Rt knee. Incorporated gait training to encourage increased step length. Noted mild improvement in step length. Patient will benefit from skilled PT to address the below impairments and improve overall function.    OBJECTIVE IMPAIRMENTS: decreased balance, decreased knowledge of condition, difficulty walking, decreased ROM, decreased strength, increased fascial restrictions, increased muscle spasms, impaired flexibility, postural dysfunction, and pain.   ACTIVITY LIMITATIONS: carrying, lifting, bending, sitting, standing, squatting, sleeping, stairs, transfers, bed mobility, toileting, and hygiene/grooming  PARTICIPATION LIMITATIONS: meal prep, cleaning, laundry, driving, shopping, community activity, occupation, yard work, and church  PERSONAL FACTORS: Fitness, Profession, Time since onset of injury/illness/exacerbation, and 3+ comorbidities: OA, HTN, Active cancer, atrial flutter  are also affecting patient's functional outcome.    REHAB POTENTIAL: Good  CLINICAL DECISION MAKING: Stable/uncomplicated  EVALUATION COMPLEXITY: Low   GOALS: Goals reviewed with patient? Yes  SHORT TERM GOALS: Target date: 03/03/2023  Patient will be independent with initial HEP  Baseline: Goal status: INITIAL  2.  Pain report to be no greater than 4/10  Baseline:  Goal status: INITIAL  3. Patient to report no falls in first 4 weeks of PT  Baseline: 2 falls  Goal status: INITIAL   LONG TERM GOALS: Target date: 03/31/2023   Patient to be independent with advanced HEP  Baseline:  Goal status: INITIAL  2.  Patient to report pain no greater than 2/10  Baseline:  Goal status: INITIAL  3.  Patient to be able to ascend and descend steps without pain or no greater than 2/10  Baseline:  Goal status: INITIAL  4.  Patient to be able to bend, stoop and squat with pain no greater than 2/10  Baseline:  Goal status: INITIAL  5.  Patient to report 85% improvement in overall symptoms Baseline:  Goal status: INITIAL  6.  Patient to report increased confidence and reduced feeling of falling Baseline:  Goal status: INITIAL   PLAN:  PT FREQUENCY: 1-2x/week  PT DURATION: 8 weeks  PLANNED INTERVENTIONS: Therapeutic exercises, Therapeutic activity, Neuromuscular re-education, Balance training, Gait training, Patient/Family education, Self Care, Joint mobilization, Stair training, Orthotic/Fit training, DME instructions, Aquatic Therapy, Dry Needling, Electrical stimulation, Cryotherapy, Moist heat, Splintting, Taping, Vasopneumatic device, Ultrasound, Biofeedback, Ionotophoresis 4mg /ml Dexamethasone, Manual therapy, and Re-evaluation  PLAN FOR NEXT SESSION: leg press, walking with head turns  Royal Hawthorn PT, DPT 02/19/23  9:48 AM

## 2023-02-20 ENCOUNTER — Ambulatory Visit (HOSPITAL_BASED_OUTPATIENT_CLINIC_OR_DEPARTMENT_OTHER)
Admission: RE | Admit: 2023-02-20 | Discharge: 2023-02-20 | Disposition: A | Discharge status: Home / Self Care | Payer: Medicare Other | Source: Ambulatory Visit | Attending: Nurse Practitioner | Admitting: Nurse Practitioner

## 2023-02-20 DIAGNOSIS — R591 Generalized enlarged lymph nodes: Secondary | ICD-10-CM | POA: Diagnosis not present

## 2023-02-20 DIAGNOSIS — C187 Malignant neoplasm of sigmoid colon: Secondary | ICD-10-CM | POA: Diagnosis not present

## 2023-02-20 DIAGNOSIS — E669 Obesity, unspecified: Secondary | ICD-10-CM | POA: Diagnosis not present

## 2023-02-20 DIAGNOSIS — N2581 Secondary hyperparathyroidism of renal origin: Secondary | ICD-10-CM | POA: Diagnosis not present

## 2023-02-20 DIAGNOSIS — I129 Hypertensive chronic kidney disease with stage 1 through stage 4 chronic kidney disease, or unspecified chronic kidney disease: Secondary | ICD-10-CM | POA: Diagnosis not present

## 2023-02-20 DIAGNOSIS — E785 Hyperlipidemia, unspecified: Secondary | ICD-10-CM | POA: Diagnosis not present

## 2023-02-20 DIAGNOSIS — C189 Malignant neoplasm of colon, unspecified: Secondary | ICD-10-CM | POA: Diagnosis not present

## 2023-02-20 DIAGNOSIS — D631 Anemia in chronic kidney disease: Secondary | ICD-10-CM | POA: Diagnosis not present

## 2023-02-20 DIAGNOSIS — N1832 Chronic kidney disease, stage 3b: Secondary | ICD-10-CM | POA: Diagnosis not present

## 2023-02-20 DIAGNOSIS — N179 Acute kidney failure, unspecified: Secondary | ICD-10-CM | POA: Diagnosis not present

## 2023-02-20 DIAGNOSIS — I700 Atherosclerosis of aorta: Secondary | ICD-10-CM | POA: Diagnosis not present

## 2023-02-25 ENCOUNTER — Inpatient Hospital Stay: Payer: Medicare Other | Admitting: Oncology

## 2023-02-25 ENCOUNTER — Inpatient Hospital Stay: Payer: Medicare Other

## 2023-03-02 ENCOUNTER — Ambulatory Visit: Payer: Medicare Other | Admitting: Physical Therapy

## 2023-03-02 ENCOUNTER — Encounter: Payer: Self-pay | Admitting: Physical Therapy

## 2023-03-02 DIAGNOSIS — G8929 Other chronic pain: Secondary | ICD-10-CM | POA: Diagnosis not present

## 2023-03-02 DIAGNOSIS — M6281 Muscle weakness (generalized): Secondary | ICD-10-CM | POA: Diagnosis not present

## 2023-03-02 DIAGNOSIS — R252 Cramp and spasm: Secondary | ICD-10-CM

## 2023-03-02 DIAGNOSIS — R262 Difficulty in walking, not elsewhere classified: Secondary | ICD-10-CM | POA: Diagnosis not present

## 2023-03-02 DIAGNOSIS — R293 Abnormal posture: Secondary | ICD-10-CM

## 2023-03-02 DIAGNOSIS — M25561 Pain in right knee: Secondary | ICD-10-CM | POA: Diagnosis not present

## 2023-03-02 NOTE — Therapy (Addendum)
OUTPATIENT PHYSICAL THERAPY LOWER EXTREMITY TREATMENT/ DISCHARGE NOTE   Patient Name: Louis [name]. MRN: [id number] DOB:10-21-1945, 77 y.o., male Today's Date: 03/02/2023  END OF SESSION:  PT End of Session - 03/02/23 1324     Visit Number 5    Date for PT Re-Evaluation 03/31/23    Authorization Type Medicare    Progress Note Due on Visit 10    PT Start Time 1232    PT Stop Time 1316    PT Time Calculation (min) 44 min    Activity Tolerance Patient tolerated treatment well    Behavior During Therapy [facility] for tasks assessed/performed                 Past Medical History:  Diagnosis Date   Allergy    seasonal allergies   Anemia    Arthritis 2010   Ascending aorta dilatation (HCC)    4.1 cm noted on CT1/15/21   Basal cell carcinoma 01/08/2023   right lower leg  needs mohs   Blood transfusion without reported diagnosis    CKD (chronic kidney disease) stage 2, GFR 60-89 ml/min    see [place], Stage 1   colon ca dx'd 04/2016   colon   Hernia, abdominal    sx fixed   Hyperlipidemia    on meds   Hypertension    on meds   LBBB (left bundle branch block) 08/18/2019   Neuropathy    Pneumonia 05/19/2018   Seasonal allergies    Thrombocytopenia (HCC)    during chemo.   Past Surgical History:  Procedure Laterality Date   A-FLUTTER ABLATION N/A 10/10/2022   Procedure: A-FLUTTER ABLATION;  Surgeon: Regan Lemming, MD;  Location: [facility] INVASIVE CV LAB;  Service: Cardiovascular;  Laterality: N/A;   BIOPSY  04/10/2016   Procedure: BIOPSY Mesentary;  Surgeon: Jimmye Norman, MD;  Location: [facility] OR;  Service: General;;   CARDIOVERSION N/A 09/04/2022   Procedure: CARDIOVERSION;  Surgeon: Sande Rives, MD;  Location: [facility] INVASIVE CV LAB;  Service: Cardiovascular;  Laterality: N/A;   COLONOSCOPY  2019   TA/tubulovillous adenoma   COLOSTOMY  2018   reversed   COLOSTOMY REVERSAL N/A 01/12/2017   Procedure: COLOSTOMY REVERSAL;  Surgeon: Jimmye Norman, MD;   Location: [facility] OR;  Service: General;  Laterality: N/A;   FLEXIBLE SIGMOIDOSCOPY N/A 04/09/2016   Procedure: FLEXIBLE SIGMOIDOSCOPY;  Surgeon: Meryl Dare, MD;  Location: [facility] ENDOSCOPY;  Service: Endoscopy;  Laterality: N/A;   HERNIA REPAIR     in childhood   INGUINAL HERNIA REPAIR Right 03/29/2018   Procedure: OPEN REPAIR OF RIGHT INGUINAL HERNIA WITH MESH;  Surgeon: Jimmye Norman, MD;  Location: [facility] OR;  Service: General;  Laterality: Right;   INSERTION OF MESH N/A 03/29/2018   Procedure: INSERTION OF MESH;  Surgeon: Jimmye Norman, MD;  Location: [facility] OR;  Service: General;  Laterality: N/A;   IR GENERIC HISTORICAL  05/14/2016   IR US GUIDE VASC ACCESS RIGHT 05/14/2016 Gilmer Mor, DO [facility]-INTERV RAD   IR GENERIC HISTORICAL  05/14/2016   IR FLUORO GUIDE PORT INSERTION RIGHT 05/14/2016 Gilmer Mor, DO [facility]-INTERV RAD   IR IMAGING GUIDED PORT INSERTION  12/17/2020   IR REMOVAL TUN ACCESS W/ PORT W/O FL MOD SED  11/11/2016   PARTIAL COLECTOMY N/A 04/10/2016   Procedure: SIGMOID  COLECTOMY WITH COLOSTOMY;  Surgeon: Jimmye Norman, MD;  Location: [facility] OR;  Service: General;  Laterality: N/A;   TEE WITHOUT CARDIOVERSION N/A 09/04/2022   Procedure: TRANSESOPHAGEAL  ECHOCARDIOGRAM;  Surgeon: Sande Rives, MD;  Location: [facility] INVASIVE CV LAB;  Service: Cardiovascular;  Laterality: N/A;   TONSILLECTOMY     TOTAL KNEE ARTHROPLASTY Left 09/29/2019   Procedure: TOTAL KNEE ARTHROPLASTY;  Surgeon: Durene Romans, MD;  Location: [facility] ORS;  Service: Orthopedics;  Laterality: Left;  70 mins   Patient Active Problem List   Diagnosis Date Noted   Hypercoagulable state due to paroxysmal atrial fibrillation (HCC) 09/22/2022   Atrial flutter with rapid ventricular response (HCC) 09/03/2022   Acute congestive heart failure (HCC) 09/03/2022   Atrial flutter (HCC) 09/03/2022   Atrial fibrillation with RVR (HCC) 09/02/2022   CKD (chronic kidney disease), stage IV (HCC) 09/02/2022   Elevated troponin 09/02/2022   Anemia  09/02/2022   Metabolic acidosis 09/02/2022   SOB (shortness of breath) 09/02/2022   Leg edema, right 09/02/2022   Goals of care, counseling/discussion 12/05/2020   S/P left TKA 09/29/2019   Status post total left knee replacement 09/29/2019   Preoperative cardiovascular examination 08/18/2019   S/P right inguinal hernia repair 03/29/2018   New onset left bundle branch block (LBBB) 01/12/2018   Atherosclerotic vascular disease 01/12/2018   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   Port catheter in place 07/02/2016   Aorto-iliac atherosclerosis (HCC) 04/10/2016   Elevated fasting glucose 04/10/2016   Right inguinal hernia 04/10/2016   Adenocarcinoma of colon (HCC) 04/10/2016   Enterocolitis    Essential hypertension 04/04/2016   Acute kidney injury superimposed on chronic kidney disease Stage 2 (HCC) 04/04/2016   Ileus (HCC) 04/02/2016    PCP: [name], MD   REFERRING PROVIDER: [name], MD  REFERRING DIAG: [id number] (ICD-10-CM) - Pain in right knee  THERAPY DIAG:  Chronic pain of right knee  Abnormal posture  Difficulty in walking, not elsewhere classified  Cramp and spasm  Muscle weakness (generalized)  Rationale for Evaluation and Treatment: Rehabilitation  ONSET DATE: 02/02/2023  SUBJECTIVE:   SUBJECTIVE STATEMENT: Patient reports he is doing okay today. He has been semi compliant with HEP. Currently his knee pain is 5/10. He was at a convention in [place] last week and spent the weekend re cooperating.   Eval: Patient reports long hx of chemo due to his form of cancer.  He is currently on oral and IV chemo meds.  He also recently had cardiac ablation (June).  Has been in A-fib a couple of times since then.  The ablation was originally for Atrial flutter  PERTINENT HISTORY: Cancer : takes oral and I.V chemo PAIN:  Are you having pain? Yes: NPRS scale: 0 currently 5-6 when my knee acts up/10 Pain location: right knee Pain description:  aching Aggravating factors: standing/walking Relieving factors: rest  PRECAUTIONS: None  RED FLAGS: None   WEIGHT BEARING RESTRICTIONS: No  FALLS:  Has patient fallen in last 6 months? Yes. Number of falls 2  LIVING ENVIRONMENT: Lives with: lives with their spouse Lives in: House/apartment Stairs: Yes: External: 6 steps; on right going up Has following equipment at home: Single point cane  OCCUPATION: truck sales at [employer]   PLOF: Independent, Independent with basic ADLs, Independent with household mobility with device, Independent with community mobility with device, Independent with homemaking with ambulation, Independent with gait, Independent with transfers, Vocation/Vocational requirements: desk job, and Leisure: the beach once in a while, watches TV, can't do much else.  PATIENT GOALS: to stop falling and walk better, gain strength  NEXT MD VISIT: prn  OBJECTIVE:  Note: Objective measures were completed at  Evaluation unless otherwise noted.  DIAGNOSTIC FINDINGS: na  PATIENT SURVEYS:  FOTO 33, predicted 26  COGNITION: Overall cognitive status: Within functional limits for tasks assessed     SENSATION: WFL   MUSCLE LENGTH: Hamstrings: Right 45 deg; Left 45 deg Thomas test: Right pos; Left pos  POSTURE: rounded shoulders, forward head, decreased lumbar lordosis, increased thoracic kyphosis, and posterior pelvic tilt  PALPATION: Mild crepitus bilateral knees  LOWER EXTREMITY ROM:  WFL bilaterally  LOWER EXTREMITY MMT:  Generally 3+/5 bilateral hips, 4-/5 bilateral knees and ankles  LOWER EXTREMITY SPECIAL TESTS:  Knee special tests: Patellafemoral apprehension test: negative and Patellafemoral grind test: positive   FUNCTIONAL TESTS:  5 times sit to stand: 32.50 sec Timed up and go (TUG): 20.33  GAIT: Distance walked: 30 Assistive device utilized: Single point cane Level of assistance: SBA Comments: short step length but consistent heel strike  with both feet everted   TODAY'S TREATMENT:                                                                                                                              DATE:  03/02/2023 NuStep Level 4 ; 6 mins- PT present to discuss status Supine short arc quads 2# ankle weight x 20 each leg Bridging x 20  Hurdles (Forwards/ Sideways) x 4 4 Square step x4 each direction Step taps 6in step x 10 each leg TKE ball against wall x 20 Hip series on airex (hip abduction, hip flexion) x 10 each Narrow BOS EO x 30 sec Narrow BOS EC 3 x 30 sec; require frequent use of UE Walking with head turns (up and down; left and right)- attempted but patient was experiencing increased knee pain and wanted to stop session   02/18/2023 NuStep Level 4 ; 6 mins- PT present to discuss status Cone Retrieval (varying heights around gym)  Hurdles (Forwards/ Sideways) x 4 Step Ups 4 inches x 10; only able to do 5 on Rt knee due to pain Sit to Stands with UE support only able to do 5 due to knee pain Cone taps standing on airex x 10 Hip series on airex (hip abduction, hip flexion) x 10 each Walking around gym with verbal cues to increase step length Dynadisk step downs x10 on Lt ; 3 on Rt  Attempted 2 inch step downs with Rt but patient was unable to perform due to knee pain Obstacle course (2 cobblestone 2 hurdles) in parallel bar   02/12/2023 NuStep Level 3 ; 6 mins- PT present to discuss status Airex Step Ups & Side Steps in parallel bar x 10 each Cone Taps (4 cones infront and lateral) in parallel bar x 10 each Balance Beam 4 square in standing with str cane Step Taps 4 inch step 2 x 30 sec Balance Beam Forwards/ Backwards & side ways x5 each Hurdles (Forwards/ Sideways) x4  Obstacle Course( hurdle, step up, airex, cobble stone x2 ) in parallel bar x4  moderate UE support   PATIENT EDUCATION:  Education details: Initial HEP, educated on anatomy of the knee and various areas/compartments for OA,  importance of proper footwear, quad strength and hip strength for alignment Person educated: Patient Education method: Explanation, Demonstration, Verbal cues, and Handouts Education comprehension: verbalized understanding, returned demonstration, and verbal cues required  HOME EXERCISE PROGRAM: Access Code: [email or web address] URL: [email or web address] Date: 02/11/2023 Prepared by: Claude Manges  Exercises - Supine Quadricep Sets  - 1 x daily - 7 x weekly - 1 sets - 20 reps - Supine Knee Extension Strengthening  - 1 x daily - 7 x weekly - 1 sets - 20 reps - Small Range Straight Leg Raise  - 1 x daily - 7 x weekly - 2 sets - 10 reps - Seated Long Arc Quad  - 1 x daily - 7 x weekly - 1 sets - 20 reps - Sit to Stand with Armchair  - 1 x daily - 7 x weekly - 2 sets - 5 reps - Supine Bridge  - 1 x daily - 7 x weekly - 1 sets - 10 reps - Clamshell with Resistance  - 1 x daily - 7 x weekly - 3 sets - 10 reps  ASSESSMENT:  CLINICAL IMPRESSION: Today's treatment session focused on balance and LE strengthening. Patient was away at a convention last week and did not come to therapy. He tried to stay consistent with his exercises, but he was not able to do them often. Patient's right knee pain was a limiting factor for today's treatment session. He was not able to complete walking with head turns due to increase knee pain. Educated patient on the three systems involved in balance and how we manipulate each system. Eyes closed on airex was challenging for patient and he required frequent use of hand hold. Patient will benefit from skilled PT to address the below impairments and improve overall function.     OBJECTIVE IMPAIRMENTS: decreased balance, decreased knowledge of condition, difficulty walking, decreased ROM, decreased strength, increased fascial restrictions, increased muscle spasms, impaired flexibility, postural dysfunction, and pain.   ACTIVITY LIMITATIONS: carrying, lifting, bending,  sitting, standing, squatting, sleeping, stairs, transfers, bed mobility, toileting, and hygiene/grooming  PARTICIPATION LIMITATIONS: meal prep, cleaning, laundry, driving, shopping, community activity, occupation, yard work, and church  PERSONAL FACTORS: Fitness, Profession, Time since onset of injury/illness/exacerbation, and 3+ comorbidities: OA, HTN, Active cancer, atrial flutter  are also affecting patient's functional outcome.   REHAB POTENTIAL: Good  CLINICAL DECISION MAKING: Stable/uncomplicated  EVALUATION COMPLEXITY: Low   GOALS: Goals reviewed with patient? Yes  SHORT TERM GOALS: Target date: 03/03/2023  Patient will be independent with initial HEP  Baseline: Goal status: INITIAL  2.  Pain report to be no greater than 4/10  Baseline:  Goal status: INITIAL  3. Patient to report no falls in first 4 weeks of PT  Baseline: 2 falls  Goal status: INITIAL   LONG TERM GOALS: Target date: 03/31/2023   Patient to be independent with advanced HEP  Baseline:  Goal status: INITIAL  2.  Patient to report pain no greater than 2/10  Baseline:  Goal status: INITIAL  3.  Patient to be able to ascend and descend steps without pain or no greater than 2/10  Baseline:  Goal status: INITIAL  4.  Patient to be able to bend, stoop and squat with pain no greater than 2/10  Baseline:  Goal status: INITIAL  5.  Patient to report 85% improvement in overall symptoms  Baseline:  Goal status: INITIAL  6.  Patient to report increased confidence and reduced feeling of falling Baseline:  Goal status: INITIAL   PLAN:  PT FREQUENCY: 1-2x/week  PT DURATION: 8 weeks  PLANNED INTERVENTIONS: Therapeutic exercises, Therapeutic activity, Neuromuscular re-education, Balance training, Gait training, Patient/Family education, Self Care, Joint mobilization, Stair training, Orthotic/Fit training, DME instructions, Aquatic Therapy, Dry Needling, Electrical stimulation, Cryotherapy, Moist  heat, Splintting, Taping, Vasopneumatic device, Ultrasound, Biofeedback, Ionotophoresis 4mg /ml Dexamethasone, Manual therapy, and Re-evaluation  PLAN FOR NEXT SESSION: assess knee pain; seated LE exercises   Claude Manges, PT 03/02/23 1:25 PM   PHYSICAL THERAPY DISCHARGE SUMMARY  Visits from Start of Care: 5  As of 06/04/2023 Patient has not returned to therapy. Patient to be discharged at this time.   Patient agrees to discharge. Patient goals were not met. Patient is being discharged due to not returning since the last visit.

## 2023-03-03 ENCOUNTER — Ambulatory Visit: Payer: Medicare Other | Admitting: Physical Therapy

## 2023-03-04 ENCOUNTER — Encounter: Payer: Self-pay | Admitting: *Deleted

## 2023-03-04 ENCOUNTER — Inpatient Hospital Stay (HOSPITAL_BASED_OUTPATIENT_CLINIC_OR_DEPARTMENT_OTHER): Payer: Medicare Other | Admitting: Oncology

## 2023-03-04 ENCOUNTER — Inpatient Hospital Stay: Payer: Medicare Other

## 2023-03-04 VITALS — BP 145/65 | HR 72 | Temp 98.1°F | Resp 20 | Ht 76.0 in | Wt 215.0 lb

## 2023-03-04 VITALS — BP 135/68 | HR 55 | Resp 18

## 2023-03-04 DIAGNOSIS — D649 Anemia, unspecified: Secondary | ICD-10-CM | POA: Diagnosis not present

## 2023-03-04 DIAGNOSIS — R110 Nausea: Secondary | ICD-10-CM | POA: Diagnosis not present

## 2023-03-04 DIAGNOSIS — Z5112 Encounter for antineoplastic immunotherapy: Secondary | ICD-10-CM | POA: Diagnosis not present

## 2023-03-04 DIAGNOSIS — C189 Malignant neoplasm of colon, unspecified: Secondary | ICD-10-CM

## 2023-03-04 DIAGNOSIS — C187 Malignant neoplasm of sigmoid colon: Secondary | ICD-10-CM | POA: Diagnosis not present

## 2023-03-04 DIAGNOSIS — C774 Secondary and unspecified malignant neoplasm of inguinal and lower limb lymph nodes: Secondary | ICD-10-CM | POA: Diagnosis not present

## 2023-03-04 DIAGNOSIS — Z95828 Presence of other vascular implants and grafts: Secondary | ICD-10-CM

## 2023-03-04 DIAGNOSIS — R21 Rash and other nonspecific skin eruption: Secondary | ICD-10-CM | POA: Diagnosis not present

## 2023-03-04 LAB — CMP (CANCER CENTER ONLY)
ALT: 20 U/L (ref 0–44)
AST: 18 U/L (ref 15–41)
Albumin: 3.2 g/dL — ABNORMAL LOW (ref 3.5–5.0)
Alkaline Phosphatase: 136 U/L — ABNORMAL HIGH (ref 38–126)
Anion gap: 7 (ref 5–15)
BUN: 42 mg/dL — ABNORMAL HIGH (ref 8–23)
CO2: 19 mmol/L — ABNORMAL LOW (ref 22–32)
Calcium: 8.5 mg/dL — ABNORMAL LOW (ref 8.9–10.3)
Chloride: 112 mmol/L — ABNORMAL HIGH (ref 98–111)
Creatinine: 2.39 mg/dL — ABNORMAL HIGH (ref 0.61–1.24)
GFR, Estimated: 27 mL/min — ABNORMAL LOW (ref >60)
Glucose, Bld: 150 mg/dL — ABNORMAL HIGH (ref 70–99)
Potassium: 4.6 mmol/L (ref 3.5–5.1)
Sodium: 138 mmol/L (ref 135–145)
Total Bilirubin: 0.3 mg/dL (ref 0.3–1.2)
Total Protein: 6.6 g/dL (ref 6.5–8.1)

## 2023-03-04 LAB — CBC WITH DIFFERENTIAL (CANCER CENTER ONLY)
Abs Immature Granulocytes: 0.02 10*3/uL (ref 0.00–0.07)
Basophils Absolute: 0 10*3/uL (ref 0.0–0.1)
Basophils Relative: 1 %
Eosinophils Absolute: 0.2 10*3/uL (ref 0.0–0.5)
Eosinophils Relative: 4 %
HCT: 29.7 % — ABNORMAL LOW (ref 39.0–52.0)
Hemoglobin: 9 g/dL — ABNORMAL LOW (ref 13.0–17.0)
Immature Granulocytes: 0 %
Lymphocytes Relative: 11 %
Lymphs Abs: 0.5 10*3/uL — ABNORMAL LOW (ref 0.7–4.0)
MCH: 27 pg (ref 26.0–34.0)
MCHC: 30.3 g/dL (ref 30.0–36.0)
MCV: 89.2 fL (ref 80.0–100.0)
Monocytes Absolute: 0.5 10*3/uL (ref 0.1–1.0)
Monocytes Relative: 10 %
Neutro Abs: 3.4 10*3/uL (ref 1.7–7.7)
Neutrophils Relative %: 74 %
Platelet Count: 201 10*3/uL (ref 150–400)
RBC: 3.33 MIL/uL — ABNORMAL LOW (ref 4.22–5.81)
RDW: 15.8 % — ABNORMAL HIGH (ref 11.5–15.5)
WBC Count: 4.7 10*3/uL (ref 4.0–10.5)
nRBC: 0 % (ref 0.0–0.2)

## 2023-03-04 LAB — MAGNESIUM: Magnesium: 1.9 mg/dL (ref 1.7–2.4)

## 2023-03-04 LAB — CEA (ACCESS): CEA (CHCC): 3.12 ng/mL (ref 0.00–5.00)

## 2023-03-04 MED ORDER — SODIUM CHLORIDE 0.9 % IV SOLN
4.0000 mg/kg | Freq: Once | INTRAVENOUS | Status: AC
  Administered 2023-03-04: 400 mg via INTRAVENOUS
  Filled 2023-03-04: qty 20

## 2023-03-04 MED ORDER — HEPARIN SOD (PORK) LOCK FLUSH 100 UNIT/ML IV SOLN
500.0000 [IU] | Freq: Once | INTRAVENOUS | Status: AC | PRN
Start: 2023-03-04 — End: 2023-03-04
  Administered 2023-03-04: 500 [IU]

## 2023-03-04 MED ORDER — ALTEPLASE 2 MG IJ SOLR
2.0000 mg | Freq: Once | INTRAMUSCULAR | Status: AC
  Administered 2023-03-04: 2 mg
  Filled 2023-03-04: qty 2

## 2023-03-04 MED ORDER — SODIUM CHLORIDE 0.9 % IV SOLN: Freq: Once | INTRAVENOUS | Status: AC

## 2023-03-04 MED ORDER — SODIUM CHLORIDE 0.9% FLUSH
10.0000 mL | INTRAVENOUS | Status: DC | PRN
  Administered 2023-03-04: 10 mL

## 2023-03-04 NOTE — Progress Notes (Signed)
[facility] OFFICE PROGRESS NOTE   Diagnosis: Colon cancer  INTERVAL HISTORY:   Louis [name] returns as scheduled.  He continues encorafenib.  He last received panitumumab 02/04/2023.  No diarrhea.  The rash has improved.  He generally feels well.  No new complaint.  He feels the left inguinal lymph node is larger.  Objective:  Vital signs in last 24 hours:  Blood pressure (!) 145/65, pulse 72, temperature 98.1 F (36.7 C), temperature source Temporal, resp. rate 20, height 6\' 4"  (1.93 m), weight 215 lb (97.5 kg), SpO2 100%.    HEENT: No thrush or ulcers Lymphatics: No cervical or supraclavicular nodes.  1 cm mobile bilateral axillary nodes.  5-6 cm firm left inguinal node Resp: Lungs clear bilaterally Cardio: Regular rate and rhythm GI: No hepatosplenomegaly, no mass, nontender Vascular: No leg edema  Skin: Mild acne type rash over the face and trunk  Portacath/PICC-without erythema  Lab Results:  Lab Results  Component Value Date   WBC 4.7 03/04/2023   HGB 9.0 (L) 03/04/2023   HCT 29.7 (L) 03/04/2023   MCV 89.2 03/04/2023   PLT 201 03/04/2023   NEUTROABS 3.4 03/04/2023    CMP  Lab Results  Component Value Date   NA 138 03/04/2023   K 4.6 03/04/2023   CL 112 (H) 03/04/2023   CO2 19 (L) 03/04/2023   GLUCOSE 150 (H) 03/04/2023   BUN 42 (H) 03/04/2023   CREATININE 2.39 (H) 03/04/2023   CALCIUM 8.5 (L) 03/04/2023   PROT 6.6 03/04/2023   ALBUMIN 3.2 (L) 03/04/2023   AST 18 03/04/2023   ALT 20 03/04/2023   ALKPHOS 136 (H) 03/04/2023   BILITOT 0.3 03/04/2023   GFRNONAA 27 (L) 03/04/2023   GFRAA 28 (L) 09/20/2019    Lab Results  Component Value Date   CEA1 2.61 01/02/2021   CEA 2.03 02/04/2023  ed the patient's current medications.   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), isolated mesenteric implant-resected, multiple tumor deposits Sigmoid/descending resection and creation of a descending colostomy  04/10/2016 MSI-stable, no loss of mismatch repair protein expression Foundation 1- BRAF V600E positive, MS-stable, intermediate tumor mutation burden, no RAS mutation CT abdomen/pelvis 04/02/2016-no evidence of distant metastatic disease Cycle 1 FOLFOX 05/21/2016 Cycle 2 FOLFOX 06/04/2016 Cycle 3 FOLFOX 06/18/2016 Cycle 4 FOLFOX 07/02/2016 (oxaliplatin held secondary to thrombocytopenia) Cycle 5 FOLFOX 07/16/2016 Cycle 6 FOLFOX 07/30/2016 Cycle 7 FOLFOX 08/13/2016 (oxaliplatin held and 5-FU dose reduced) Cycle 8 FOLFOX 09/03/2016 (oxaliplatin held secondary to neuropathy) Cycle 9 FOLFOX 09/17/2016 (oxaliplatin held secondary to neuropathy) Cycle 10 FOLFOX 10/02/2016 Cycle 11 FOLFOX 10/15/2016 (oxaliplatin eliminated from the regimen) Cycle 12 FOLFOX 10/30/2016 (oxaliplatin eliminated) CTs 05/12/2017-no evidence of recurrent disease, right inguinal hernia containing bladder Colonoscopy 07/21/2017, 3 polyps were removed from the descending and transverse colon, fragments of tubular and tubulovillous adenoma CTs 05/19/2018-no evidence of recurrent disease, status post hernia repair, right upper lobe pneumonia CTs 05/20/2019-resolution of right upper lobe pneumonia, no evidence of metastatic disease Colonoscopy 01/25/2020-multiple polyps removed-tubular adenomas, poor preparation, repeat colonoscopy recommended CTs neck, chest, and abdomen/pelvis 08/27/2020- new 2 centimeter left axillary node, new 1.9 cm left inguinal node chronic mildly prominent portacaval node Ultrasound biopsy of left inguinal node on 10/29/2020-metastatic adenocarcinoma consistent with colon adenocarcinoma PET 11/12/2020-enlarged hypermetabolic left inguinal and left axillary nodes, solitary focus of hypermetabolic activity in the right lower quadrant small bowel Cycle 1 FOLFOX 12/19/2020 Cycle 2 FOLFOX 01/02/2021, oxaliplatin dose reduced secondary to neutropenia and thrombocytopenia Cycle 3 FOLFOX 01/16/2021 Cycle  4 FOLFOX  01/30/2021 CTs 02/11/2021-no change in left inguinal and left axillary nodes, no evidence of disease progression, stable wall thickening involving a loop of distal ileum Cycle 1 FOLFIRI/Avastin 02/27/2021 Cycle 2 FOLFIRI/Avastin 03/13/2021 Cycle 3 FOLFIRI/Avastin 04/03/2021 Cycle 4 FOLFIRI 04/17/2021, Avastin held due to elevated urine protein 05/07/2021-24-hour urine protein elevated 1562 Cycle 5 FOLFIRI 05/08/2021, Avastin held due to elevated urine protein, irinotecan dose reduced due to in general not feeling well after treatment CTs 05/17/2021-left axillary and left inguinal lymph nodes are smaller, no evidence of disease progression Cycle 6 FOLFIRI 05/22/2021, Avastin held due to proteinuria 06/03/2021 24-hour urine with 1.9 g of protein Cycle 7 FOLFIRI 06/12/2021, Avastin held due to proteinuria Cycle 8 FOLFIRI 07/03/2021, Avastin held due to proteinuria Cycle 9 FOLFIRI 07/24/2021, Avastin held due to proteinuria Cycle 10 FOLFIRI 08/19/2021, Avastin held due to proteinuria CTs 09/02/2021-no change in the left axillary, left inguinal, and portacaval lymph nodes.  No mention of the distal small bowel thickening Maintenance 5-fluorouracil 09/18/2021 CT abdomen/pelvis 10/28/2021-enlarged left inguinal lymph node, stable; stomach and small bowel grossly unremarkable. Maintenance 5-fluorouracil continued Colonoscopy 12/10/2021-terminal ileum with an ulcerated partially obstructing medium-sized mass approximately 15 to 20 cm from the IC valve, invasive adenocarcinoma, colonic type, moderately differentiated (grade 2) PET scan 12/18/2021-progression of disease in the peritoneal space.  2 intensely hypermetabolic nodes in the left axilla, one node is new from the prior, the other is reduced in size and metabolic activity; decrease in size and metabolic activity of left inguinal adenopathy; intense metabolic activity associate with a loop of small bowel in the right lower quadrant, no interval change; new small  hypermetabolic peritoneal implant along the ventral peritoneal surface just right of midline; larger new peritoneal implant in the deep right pelvis; small new implant in the right mid abdomen Encorafenib/Panitumumab 01/08/2022 CTs 03/17/2022-decrease size of mesenteric implant at the posterior bladder wall, small mesenteric peritoneal lesions identified on PET/CT or not evident.  Decrease size of left inguinal left axillary nodes, hypermetabolic activity noted at the anterior left prostate on comparison August 2023 PET CTs 07/21/2022-decrease size of a left axillary lymph node and right pelvic implant.  Left inguinal lymph node measures smaller.  No new sites of disease identified.  Similar tiny pulmonary nodules. Encorafenib and Panitumumab continued, Panitumumab changed from a 2-week schedule to a 3-week schedule 07/23/2022 CT 10/30/2022-stable left axillary lymph node, no new or progressive metastatic disease in the chest; stable left inguinal lymphadenopathy; decrease in size of deep right peritoneal implant; no new metastatic disease in the abdomen/pelvis. encorafenib and Panitumumab continued Panitumumab dose reduced 11/25/2022 due to skin rash CTs 02/20/2023-increased left inguinal adenopathy, developing left external iliac and ileocolic mesenteric adenopathy, unchanged left axillary node, enlarging pulmonary nodules minimal enlargement of cul-de-sac nodule Encorafenib and panitumumab continued   2.   Chronic renal insufficiency   3.   Hypertension   4.  Inflamed sebaceous cyst at the upper back-status post incision and drainage   5.  Thrombocytopenia secondary chemotherapy-oxaliplatin dose reduced beginning with cycle 3 FOLFOX, oxaliplatin held with cycle 4 and cycle 7 FOLFOX   6.  Oxaliplatin neuropathy-improved   7.  History of Mucositis secondary chemotherapy   8.  Symptoms of pneumonia January 2020-CT 05/19/2018 consistent with right upper lobe pneumonia, Levaquin prescribed 05/20/2018    9.  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  Anemia, progressive 10/29/2021 2 units packed red blood cells 10/31/2021 Ferritin low 10/29/2021-oral  iron Ferritin low 01/22/2022   12.  Colonoscopy 12/10/2021-terminal ileum with an ulcerated partially obstructing medium-sized mass approximately 15 to 20 cm from the IC valve, invasive adenocarcinoma, colonic type, moderately differentiated (grade 2) 13.  Admission 09/02/2022 with atrial flutter and rapid ventricular response 14.  Neuropathy?  Of the feet      Disposition: Louis [name] appears stable.  I reviewed the restaging CT findings and images with him.  There appears to be minimal disease progression in multiple sites including tiny lung nodules and the dominant left inguinal lymph node.  His skin rash has improved.  We discussed treatment options including a treatment break, continuing encorafenib/panitumumab, Lonsurf/bevacizumab, and fruquintinib.  We also discussed a return to see Dr. Maryruth Hancock to consider clinical trial options.  [name] prefers continuing the current treatment regimen since he is tolerating the treatment well and there is minimal evidence of disease progression.  The plan is to continue encorafenib/panitumumab on the current schedule.  We will consider dose escalating the panitumumab and switching to an every 2-week schedule if the left inguinal lymph node continues to enlarge.  He will complete another treatment with panitumumab today.  He will return for an office visit and panitumumab in 3 weeks. Thornton Papas, MD  03/04/2023  9:45 AM

## 2023-03-04 NOTE — Progress Notes (Signed)
Patient seen by Dr. Thornton Papas today  Vitals are within treatment parameters:Yes   Labs are within treatment parameters: No (Please specify and give further instructions.) Creatinine 2.39--OK to proceed.  Treatment plan has been signed: Yes   Per physician team, Patient is ready for treatment and there are NO modifications to the treatment plan.

## 2023-03-04 NOTE — Patient Instructions (Signed)
[facility]   Discharge Instructions: Thank you for choosing [facility] to provide your oncology and hematology care.   If you have a lab appointment with the Cancer Center, please go directly to the Cancer Center and check in at the registration area.   Wear comfortable clothing and clothing appropriate for easy access to any Portacath or PICC line.   We strive to give you quality time with your provider. You may need to reschedule your appointment if you arrive late (15 or more minutes).  Arriving late affects you and other patients whose appointments are after yours.  Also, if you miss three or more appointments without notifying the office, you may be dismissed from the clinic at the provider's discretion.      For prescription refill requests, have your pharmacy contact our office and allow 72 hours for refills to be completed.    Today you received the following chemotherapy and/or immunotherapy agents Panitumumab (VECTIBIX).      To help prevent nausea and vomiting after your treatment, we encourage you to take your nausea medication as directed.  BELOW ARE SYMPTOMS THAT SHOULD BE REPORTED IMMEDIATELY: *FEVER GREATER THAN 100.4 F (38 C) OR HIGHER *CHILLS OR SWEATING *NAUSEA AND VOMITING THAT IS NOT CONTROLLED WITH YOUR NAUSEA MEDICATION *UNUSUAL SHORTNESS OF BREATH *UNUSUAL BRUISING OR BLEEDING *URINARY PROBLEMS (pain or burning when urinating, or frequent urination) *BOWEL PROBLEMS (unusual diarrhea, constipation, pain near the anus) TENDERNESS IN MOUTH AND THROAT WITH OR WITHOUT PRESENCE OF ULCERS (sore throat, sores in mouth, or a toothache) UNUSUAL RASH, SWELLING OR PAIN  UNUSUAL VAGINAL DISCHARGE OR ITCHING   Items with * indicate a potential emergency and should be followed up as soon as possible or go to the Emergency Department if any problems should occur.  Please show the CHEMOTHERAPY ALERT CARD or IMMUNOTHERAPY ALERT  CARD at check-in to the Emergency Department and triage nurse.  Should you have questions after your visit or need to cancel or reschedule your appointment, please contact [facility]  Dept: [phone]  and follow the prompts.  Office hours are 8:00 a.m. to 4:30 p.m. Monday - Friday. Please note that voicemails left after 4:00 p.m. may not be returned until the following business day.  We are closed weekends and major holidays. You have access to a nurse at all times for urgent questions. Please call the main number to the clinic Dept: [phone] and follow the prompts.   For any non-urgent questions, you may also contact your provider using MyChart. We now offer e-Visits for anyone 61 and older to request care online for non-urgent symptoms. For details visit [email or web address].   Also download the MyChart app! Go to the app store, search "MyChart", open the app, select [place], and log in with your MyChart username and password.  Panitumumab Injection What is this medication? PANITUMUMAB (pan i TOOM ue mab) treats colorectal cancer. It works by blocking a protein that causes cancer cells to grow and multiply. This helps to slow or stop the spread of cancer cells. It is a monoclonal antibody. This medicine may be used for other purposes; ask your health care provider or pharmacist if you have questions. COMMON BRAND NAME(S): Vectibix What should I tell my care team before I take this medication? They need to know if you have any of these conditions: Eye disease Low levels of magnesium in the blood Lung disease An unusual or allergic  reaction to panitumumab, other medications, foods, dyes, or preservatives Pregnant or trying to get pregnant Breast-feeding How should I use this medication? This medication is injected into a vein. It is given by your care team in a hospital or clinic setting. Talk to your care team about the use of this medication  in children. Special care may be needed. Overdosage: If you think you have taken too much of this medicine contact a poison control center or emergency room at once. NOTE: This medicine is only for you. Do not share this medicine with others. What if I miss a dose? Keep appointments for follow-up doses. It is important not to miss your dose. Call your care team if you are unable to keep an appointment. What may interact with this medication? Bevacizumab This list may not describe all possible interactions. Give your health care provider a list of all the medicines, herbs, non-prescription drugs, or dietary supplements you use. Also tell them if you smoke, drink alcohol, or use illegal drugs. Some items may interact with your medicine. What should I watch for while using this medication? Your condition will be monitored carefully while you are receiving this medication. This medication may make you feel generally unwell. This is not uncommon as chemotherapy can affect healthy cells as well as cancer cells. Report any side effects. Continue your course of treatment even though you feel ill unless your care team tells you to stop. This medication can make you more sensitive to the sun. Keep out of the sun while receiving this medication and for 2 months after stopping therapy. If you cannot avoid being in the sun, wear protective clothing and sunscreen. Do not use sun lamps, tanning beds, or tanning booths. Check with your care team if you have severe diarrhea, nausea, and vomiting or if you sweat a lot. The loss of too much body fluid may make it dangerous for you to take this medication. This medication may cause serious skin reactions. They can happen weeks to months after starting the medication. Contact your care team right away if you notice fevers or flu-like symptoms with a rash. The rash may be red or purple and then turn into blisters or peeling of the skin. You may also notice a red rash with  swelling of the face, lips, or lymph nodes in your neck or under your arms. Talk to your care team if you may be pregnant. Serious birth defects can occur if you take this medication during pregnancy and for 2 months after the last dose. Contraception is recommended while taking this medication and for 2 months after the last dose. Your care team can help you find the option that works for you. Do not breastfeed while taking this medication and for 2 months after the last dose. This medication may cause infertility. Talk to your care team if you are concerned about your fertility. What side effects may I notice from receiving this medication? Side effects that you should report to your care team as soon as possible: Allergic reactions--skin rash, itching, hives, swelling of the face, lips, tongue, or throat Dry cough, shortness of breath or trouble breathing Eye pain, redness, irritation, or discharge with blurry or decreased vision Infusion reactions--chest pain, shortness of breath or trouble breathing, feeling faint or lightheaded Low magnesium level--muscle pain or cramps, unusual weakness or fatigue, fast or irregular heartbeat, tremors Low potassium level--muscle pain or cramps, unusual weakness or fatigue, fast or irregular heartbeat, constipation Redness, blistering, peeling, or loosening  of the skin, including inside the mouth Skin reactions on sun-exposed areas Side effects that usually do not require medical attention (report to your care team if they continue or are bothersome): Change in nail shape, thickness, or color Diarrhea Dry skin Fatigue Nausea Vomiting This list may not describe all possible side effects. Call your doctor for medical advice about side effects. You may report side effects to FDA at 1-800-FDA-1088. Where should I keep my medication? This medication is given in a hospital or clinic. It will not be stored at home. NOTE: This sheet is a summary. It may not  cover all possible information. If you have questions about this medicine, talk to your doctor, pharmacist, or health care provider.  2024 Elsevier/Gold Standard (2021-09-04 00:00:00)

## 2023-03-09 ENCOUNTER — Ambulatory Visit: Payer: Medicare Other | Admitting: Physical Therapy

## 2023-03-11 ENCOUNTER — Other Ambulatory Visit: Payer: Self-pay | Admitting: *Deleted

## 2023-03-11 DIAGNOSIS — C189 Malignant neoplasm of colon, unspecified: Secondary | ICD-10-CM

## 2023-03-11 DIAGNOSIS — D649 Anemia, unspecified: Secondary | ICD-10-CM

## 2023-03-11 MED ORDER — FERROUS SULFATE 325 (65 FE) MG PO TBEC
325.0000 mg | DELAYED_RELEASE_TABLET | Freq: Two times a day (BID) | ORAL | 2 refills | Status: AC
  Filled 2023-11-18: qty 60, 30d supply, fill #0

## 2023-03-11 NOTE — Telephone Encounter (Signed)
Patient left message requesting refill on his ferrous sulfate.

## 2023-03-12 ENCOUNTER — Ambulatory Visit: Payer: Medicare Other | Admitting: Physical Therapy

## 2023-03-16 ENCOUNTER — Ambulatory Visit: Payer: Medicare Other | Admitting: Physical Therapy

## 2023-03-19 ENCOUNTER — Encounter: Payer: Medicare Other | Admitting: Physical Therapy

## 2023-03-20 ENCOUNTER — Other Ambulatory Visit (HOSPITAL_COMMUNITY): Payer: Self-pay

## 2023-03-21 ENCOUNTER — Other Ambulatory Visit: Payer: Self-pay | Admitting: Oncology

## 2023-03-23 ENCOUNTER — Encounter: Payer: Self-pay | Admitting: Oncology

## 2023-03-24 ENCOUNTER — Telehealth: Payer: Self-pay

## 2023-03-24 NOTE — Telephone Encounter (Signed)
Patient cancelled labs, portflush, office, infusion for 03/25/23 and request to come back as scheduled due to not feeling well, and Thanksgiving. Spoke Misty Stanley and advised patient that would be ok. He is aware of upcoming appointment date and time.

## 2023-03-25 ENCOUNTER — Inpatient Hospital Stay: Payer: Medicare Other

## 2023-03-25 ENCOUNTER — Inpatient Hospital Stay: Payer: Medicare Other | Admitting: Nurse Practitioner

## 2023-03-31 ENCOUNTER — Other Ambulatory Visit: Payer: Self-pay | Admitting: *Deleted

## 2023-03-31 MED ORDER — ENCORAFENIB 75 MG PO CAPS: 300.0000 mg | ORAL_CAPSULE | Freq: Every day | ORAL | 1 refills | Status: DC

## 2023-04-08 DIAGNOSIS — M2560 Stiffness of unspecified joint, not elsewhere classified: Secondary | ICD-10-CM | POA: Diagnosis not present

## 2023-04-08 DIAGNOSIS — M79642 Pain in left hand: Secondary | ICD-10-CM | POA: Diagnosis not present

## 2023-04-08 DIAGNOSIS — M1991 Primary osteoarthritis, unspecified site: Secondary | ICD-10-CM | POA: Diagnosis not present

## 2023-04-08 DIAGNOSIS — Z6824 Body mass index (BMI) 24.0-24.9, adult: Secondary | ICD-10-CM | POA: Diagnosis not present

## 2023-04-08 DIAGNOSIS — M79641 Pain in right hand: Secondary | ICD-10-CM | POA: Diagnosis not present

## 2023-04-08 DIAGNOSIS — M25561 Pain in right knee: Secondary | ICD-10-CM | POA: Diagnosis not present

## 2023-04-09 ENCOUNTER — Ambulatory Visit: Payer: Medicare Other | Admitting: Dermatology

## 2023-04-11 ENCOUNTER — Other Ambulatory Visit: Payer: Self-pay | Admitting: Oncology

## 2023-04-15 ENCOUNTER — Inpatient Hospital Stay: Payer: Medicare Other

## 2023-04-15 ENCOUNTER — Encounter: Payer: Self-pay | Admitting: Nurse Practitioner

## 2023-04-15 ENCOUNTER — Inpatient Hospital Stay: Payer: Medicare Other | Attending: Oncology

## 2023-04-15 ENCOUNTER — Inpatient Hospital Stay (HOSPITAL_BASED_OUTPATIENT_CLINIC_OR_DEPARTMENT_OTHER): Payer: Medicare Other | Admitting: Nurse Practitioner

## 2023-04-15 VITALS — BP 116/60 | HR 64 | Temp 98.1°F | Resp 18 | Ht 76.0 in | Wt 203.4 lb

## 2023-04-15 VITALS — BP 112/54 | HR 56

## 2023-04-15 DIAGNOSIS — Z452 Encounter for adjustment and management of vascular access device: Secondary | ICD-10-CM | POA: Insufficient documentation

## 2023-04-15 DIAGNOSIS — Z95828 Presence of other vascular implants and grafts: Secondary | ICD-10-CM

## 2023-04-15 DIAGNOSIS — D509 Iron deficiency anemia, unspecified: Secondary | ICD-10-CM | POA: Insufficient documentation

## 2023-04-15 DIAGNOSIS — C189 Malignant neoplasm of colon, unspecified: Secondary | ICD-10-CM | POA: Diagnosis not present

## 2023-04-15 DIAGNOSIS — C778 Secondary and unspecified malignant neoplasm of lymph nodes of multiple regions: Secondary | ICD-10-CM | POA: Diagnosis not present

## 2023-04-15 DIAGNOSIS — D6959 Other secondary thrombocytopenia: Secondary | ICD-10-CM | POA: Diagnosis not present

## 2023-04-15 DIAGNOSIS — N189 Chronic kidney disease, unspecified: Secondary | ICD-10-CM | POA: Diagnosis not present

## 2023-04-15 DIAGNOSIS — Z5112 Encounter for antineoplastic immunotherapy: Secondary | ICD-10-CM | POA: Diagnosis not present

## 2023-04-15 DIAGNOSIS — C187 Malignant neoplasm of sigmoid colon: Secondary | ICD-10-CM | POA: Diagnosis not present

## 2023-04-15 DIAGNOSIS — I129 Hypertensive chronic kidney disease with stage 1 through stage 4 chronic kidney disease, or unspecified chronic kidney disease: Secondary | ICD-10-CM | POA: Insufficient documentation

## 2023-04-15 DIAGNOSIS — G620 Drug-induced polyneuropathy: Secondary | ICD-10-CM | POA: Insufficient documentation

## 2023-04-15 LAB — CBC WITH DIFFERENTIAL (CANCER CENTER ONLY)
Abs Immature Granulocytes: 0.05 10*3/uL (ref 0.00–0.07)
Basophils Absolute: 0.1 10*3/uL (ref 0.0–0.1)
Basophils Relative: 1 %
Eosinophils Absolute: 0.2 10*3/uL (ref 0.0–0.5)
Eosinophils Relative: 3 %
HCT: 28.2 % — ABNORMAL LOW (ref 39.0–52.0)
Hemoglobin: 8.7 g/dL — ABNORMAL LOW (ref 13.0–17.0)
Immature Granulocytes: 1 %
Lymphocytes Relative: 8 %
Lymphs Abs: 0.6 10*3/uL — ABNORMAL LOW (ref 0.7–4.0)
MCH: 25.7 pg — ABNORMAL LOW (ref 26.0–34.0)
MCHC: 30.9 g/dL (ref 30.0–36.0)
MCV: 83.2 fL (ref 80.0–100.0)
Monocytes Absolute: 0.9 10*3/uL (ref 0.1–1.0)
Monocytes Relative: 13 %
Neutro Abs: 5 10*3/uL (ref 1.7–7.7)
Neutrophils Relative %: 74 %
Platelet Count: 191 10*3/uL (ref 150–400)
RBC: 3.39 MIL/uL — ABNORMAL LOW (ref 4.22–5.81)
RDW: 17.8 % — ABNORMAL HIGH (ref 11.5–15.5)
WBC Count: 6.7 10*3/uL (ref 4.0–10.5)
nRBC: 0 % (ref 0.0–0.2)

## 2023-04-15 LAB — CMP (CANCER CENTER ONLY)
ALT: <5 U/L (ref 0–44)
AST: 7 U/L — ABNORMAL LOW (ref 15–41)
Albumin: 3.1 g/dL — ABNORMAL LOW (ref 3.5–5.0)
Alkaline Phosphatase: 87 U/L (ref 38–126)
Anion gap: 5 (ref 5–15)
BUN: 39 mg/dL — ABNORMAL HIGH (ref 8–23)
CO2: 20 mmol/L — ABNORMAL LOW (ref 22–32)
Calcium: 8.1 mg/dL — ABNORMAL LOW (ref 8.9–10.3)
Chloride: 111 mmol/L (ref 98–111)
Creatinine: 2.44 mg/dL — ABNORMAL HIGH (ref 0.61–1.24)
GFR, Estimated: 27 mL/min — ABNORMAL LOW (ref >60)
Glucose, Bld: 107 mg/dL — ABNORMAL HIGH (ref 70–99)
Potassium: 4.5 mmol/L (ref 3.5–5.1)
Sodium: 136 mmol/L (ref 135–145)
Total Bilirubin: 0.3 mg/dL (ref <1.2)
Total Protein: 6.1 g/dL — ABNORMAL LOW (ref 6.5–8.1)

## 2023-04-15 LAB — CEA (ACCESS): CEA (CHCC): 3.66 ng/mL (ref 0.00–5.00)

## 2023-04-15 LAB — MAGNESIUM: Magnesium: 2 mg/dL (ref 1.7–2.4)

## 2023-04-15 MED ORDER — HEPARIN SOD (PORK) LOCK FLUSH 100 UNIT/ML IV SOLN
500.0000 [IU] | Freq: Once | INTRAVENOUS | Status: AC | PRN
Start: 2023-04-15 — End: 2023-04-15
  Administered 2023-04-15: 500 [IU]

## 2023-04-15 MED ORDER — SODIUM CHLORIDE 0.9% FLUSH
10.0000 mL | INTRAVENOUS | Status: DC | PRN
  Administered 2023-04-15: 10 mL

## 2023-04-15 MED ORDER — LIDOCAINE-PRILOCAINE 2.5-2.5 % EX CREA: 1.0000 | TOPICAL_CREAM | CUTANEOUS | 2 refills | Status: DC | PRN

## 2023-04-15 MED ORDER — ALTEPLASE 2 MG IJ SOLR
2.0000 mg | Freq: Once | INTRAMUSCULAR | Status: AC | PRN
  Administered 2023-04-15: 2 mg
  Filled 2023-04-15: qty 2

## 2023-04-15 MED ORDER — PROCHLORPERAZINE MALEATE 10 MG PO TABS: 10.0000 mg | ORAL_TABLET | Freq: Four times a day (QID) | ORAL | 3 refills | Status: AC | PRN

## 2023-04-15 MED ORDER — SODIUM CHLORIDE 0.9 % IV SOLN: Freq: Once | INTRAVENOUS | Status: AC

## 2023-04-15 MED ORDER — SODIUM CHLORIDE 0.9 % IV SOLN
4.0000 mg/kg | Freq: Once | INTRAVENOUS | Status: AC
  Administered 2023-04-15: 400 mg via INTRAVENOUS
  Filled 2023-04-15: qty 20

## 2023-04-15 NOTE — Patient Instructions (Signed)

## 2023-04-15 NOTE — Progress Notes (Signed)
[facility] OFFICE PROGRESS NOTE   Diagnosis: Colon cancer  INTERVAL HISTORY:   [name] returns as scheduled.  He continues encorafenib.  Last Panitumumab 03/04/2023.  He canceled treatment 03/25/2023 due to not feeling well.  He reports developing symptoms of "food poisoning" around the Thanksgiving holiday.  He had abdominal pain, nausea/vomiting, diarrhea for a few days.  Symptoms then resolved and recurred about a week later.  Symptoms again resolved and have not recurred.  He has noted weight loss since coinciding with the above.  He is now eating a regular diet.  He thinks the "cyst" component at the left groin is larger.  Stable skin rash.  Objective:  Vital signs in last 24 hours:  Blood pressure 116/60, pulse 64, temperature 98.1 F (36.7 C), temperature source Temporal, resp. rate 18, height 6\' 4"  (1.93 m), weight 203 lb 6.4 oz (92.3 kg), SpO2 100%.    HEENT: No thrush or ulcers. Lymphatics: Approximate 1 cm left axillary lymph node.  Firm approximate 7 cm left inguinal node. Resp: Lungs clear bilaterally. Cardio: Regular rate and rhythm. GI: No hepatosplenomegaly. Vascular: No leg edema. Skin: Skin turgor intact.  Mild acne type rash face and trunk. Port-A-Cath without erythema.  Lab Results:  Lab Results  Component Value Date   WBC 6.7 04/15/2023   HGB 8.7 (L) 04/15/2023   HCT 28.2 (L) 04/15/2023   MCV 83.2 04/15/2023   PLT 191 04/15/2023   NEUTROABS 5.0 04/15/2023    Imaging:  No results found.  Medications: I have reviewed the patient's current medications.  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), isolated mesenteric implant-resected, multiple tumor deposits Sigmoid/descending resection and creation of a descending colostomy 04/10/2016 MSI-stable, no loss of mismatch repair protein expression Foundation 1- BRAF V600E positive, MS-stable, intermediate tumor mutation burden, no RAS mutation CT abdomen/pelvis  04/02/2016-no evidence of distant metastatic disease Cycle 1 FOLFOX 05/21/2016 Cycle 2 FOLFOX 06/04/2016 Cycle 3 FOLFOX 06/18/2016 Cycle 4 FOLFOX 07/02/2016 (oxaliplatin held secondary to thrombocytopenia) Cycle 5 FOLFOX 07/16/2016 Cycle 6 FOLFOX 07/30/2016 Cycle 7 FOLFOX 08/13/2016 (oxaliplatin held and 5-FU dose reduced) Cycle 8 FOLFOX 09/03/2016 (oxaliplatin held secondary to neuropathy) Cycle 9 FOLFOX 09/17/2016 (oxaliplatin held secondary to neuropathy) Cycle 10 FOLFOX 10/02/2016 Cycle 11 FOLFOX 10/15/2016 (oxaliplatin eliminated from the regimen) Cycle 12 FOLFOX 10/30/2016 (oxaliplatin eliminated) CTs 05/12/2017-no evidence of recurrent disease, right inguinal hernia containing bladder Colonoscopy 07/21/2017, 3 polyps were removed from the descending and transverse colon, fragments of tubular and tubulovillous adenoma CTs 05/19/2018-no evidence of recurrent disease, status post hernia repair, right upper lobe pneumonia CTs 05/20/2019-resolution of right upper lobe pneumonia, no evidence of metastatic disease Colonoscopy 01/25/2020-multiple polyps removed-tubular adenomas, poor preparation, repeat colonoscopy recommended CTs neck, chest, and abdomen/pelvis 08/27/2020- new 2 centimeter left axillary node, new 1.9 cm left inguinal node chronic mildly prominent portacaval node Ultrasound biopsy of left inguinal node on 10/29/2020-metastatic adenocarcinoma consistent with colon adenocarcinoma PET 11/12/2020-enlarged hypermetabolic left inguinal and left axillary nodes, solitary focus of hypermetabolic activity in the right lower quadrant small bowel Cycle 1 FOLFOX 12/19/2020 Cycle 2 FOLFOX 01/02/2021, oxaliplatin dose reduced secondary to neutropenia and thrombocytopenia Cycle 3 FOLFOX 01/16/2021 Cycle 4 FOLFOX 01/30/2021 CTs 02/11/2021-no change in left inguinal and left axillary nodes, no evidence of disease progression, stable wall thickening involving a loop of distal ileum Cycle 1 FOLFIRI/Avastin  02/27/2021 Cycle 2 FOLFIRI/Avastin 03/13/2021 Cycle 3 FOLFIRI/Avastin 04/03/2021 Cycle 4 FOLFIRI 04/17/2021, Avastin held due to elevated urine protein 05/07/2021-24-hour urine protein elevated 1562 Cycle 5  FOLFIRI 05/08/2021, Avastin held due to elevated urine protein, irinotecan dose reduced due to in general not feeling well after treatment CTs 05/17/2021-left axillary and left inguinal lymph nodes are smaller, no evidence of disease progression Cycle 6 FOLFIRI 05/22/2021, Avastin held due to proteinuria 06/03/2021 24-hour urine with 1.9 g of protein Cycle 7 FOLFIRI 06/12/2021, Avastin held due to proteinuria Cycle 8 FOLFIRI 07/03/2021, Avastin held due to proteinuria Cycle 9 FOLFIRI 07/24/2021, Avastin held due to proteinuria Cycle 10 FOLFIRI 08/19/2021, Avastin held due to proteinuria CTs 09/02/2021-no change in the left axillary, left inguinal, and portacaval lymph nodes.  No mention of the distal small bowel thickening Maintenance 5-fluorouracil 09/18/2021 CT abdomen/pelvis 10/28/2021-enlarged left inguinal lymph node, stable; stomach and small bowel grossly unremarkable. Maintenance 5-fluorouracil continued Colonoscopy 12/10/2021-terminal ileum with an ulcerated partially obstructing medium-sized mass approximately 15 to 20 cm from the IC valve, invasive adenocarcinoma, colonic type, moderately differentiated (grade 2) PET scan 12/18/2021-progression of disease in the peritoneal space.  2 intensely hypermetabolic nodes in the left axilla, one node is new from the prior, the other is reduced in size and metabolic activity; decrease in size and metabolic activity of left inguinal adenopathy; intense metabolic activity associate with a loop of small bowel in the right lower quadrant, no interval change; new small hypermetabolic peritoneal implant along the ventral peritoneal surface just right of midline; larger new peritoneal implant in the deep right pelvis; small new implant in the right mid  abdomen Encorafenib/Panitumumab 01/08/2022 CTs 03/17/2022-decrease size of mesenteric implant at the posterior bladder wall, small mesenteric peritoneal lesions identified on PET/CT or not evident.  Decrease size of left inguinal left axillary nodes, hypermetabolic activity noted at the anterior left prostate on comparison August 2023 PET CTs 07/21/2022-decrease size of a left axillary lymph node and right pelvic implant.  Left inguinal lymph node measures smaller.  No new sites of disease identified.  Similar tiny pulmonary nodules. Encorafenib and Panitumumab continued, Panitumumab changed from a 2-week schedule to a 3-week schedule 07/23/2022 CT 10/30/2022-stable left axillary lymph node, no new or progressive metastatic disease in the chest; stable left inguinal lymphadenopathy; decrease in size of deep right peritoneal implant; no new metastatic disease in the abdomen/pelvis. encorafenib and Panitumumab continued Panitumumab dose reduced 11/25/2022 due to skin rash CTs 02/20/2023-increased left inguinal adenopathy, developing left external iliac and ileocolic mesenteric adenopathy, unchanged left axillary node, enlarging pulmonary nodules minimal enlargement of cul-de-sac nodule Encorafenib and panitumumab continued Panitumumab every 2 weeks beginning 04/15/2023, continue encorafenib   2.   Chronic renal insufficiency   3.   Hypertension   4.  Inflamed sebaceous cyst at the upper back-status post incision and drainage   5.  Thrombocytopenia secondary chemotherapy-oxaliplatin dose reduced beginning with cycle 3 FOLFOX, oxaliplatin held with cycle 4 and cycle 7 FOLFOX   6.  Oxaliplatin neuropathy-improved   7.  History of Mucositis secondary chemotherapy   8.  Symptoms of pneumonia January 2020-CT 05/19/2018 consistent with right upper lobe pneumonia, Levaquin prescribed 05/20/2018   9.  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  Anemia,  progressive 10/29/2021 2 units packed red blood cells 10/31/2021 Ferritin low 10/29/2021-oral iron Ferritin low 01/22/2022   12.  Colonoscopy 12/10/2021-terminal ileum with an ulcerated partially obstructing medium-sized mass approximately 15 to 20 cm from the IC valve, invasive adenocarcinoma, colonic type, moderately differentiated (grade 2) 13.  Admission 09/02/2022 with atrial flutter and rapid ventricular response 14.  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 appears stable.  He is on active treatment with encorafenib and Panitumumab on a 3-week schedule.  There may be mild progression involving the left inguinal lymph node.  We decided to change Panitumumab back to a 2-week schedule, continue current dose.  He agrees with this plan.  CBC and chemistry panel reviewed.  Labs adequate to proceed as above.  Stable elevated creatinine.  He has lost weight since his last visit.  He may have had food poisoning a few weeks ago.  We will continue to monitor.  He will return for follow-up and Panitumumab in 2 weeks.  We are available to see him sooner if needed.  Plan reviewed with Dr. Truett Perna.  Lonna Cobb ANP/GNP-BC   04/15/2023  11:28 AM

## 2023-04-15 NOTE — Patient Instructions (Signed)
[facility] - A DEPT OF [facility]   Discharge Instructions: Thank you for choosing [facility] to provide your oncology and hematology care.   If you have a lab appointment with the Cancer Center, please go directly to the Cancer Center and check in at the registration area.   Wear comfortable clothing and clothing appropriate for easy access to any Portacath or PICC line.   We strive to give you quality time with your provider. You may need to reschedule your appointment if you arrive late (15 or more minutes).  Arriving late affects you and other patients whose appointments are after yours.  Also, if you miss three or more appointments without notifying the office, you may be dismissed from the clinic at the provider's discretion.      For prescription refill requests, have your pharmacy contact our office and allow 72 hours for refills to be completed.    Today you received the following chemotherapy and/or immunotherapy agents Panitumumab (VECTIBIX).      To help prevent nausea and vomiting after your treatment, we encourage you to take your nausea medication as directed.  BELOW ARE SYMPTOMS THAT SHOULD BE REPORTED IMMEDIATELY: *FEVER GREATER THAN 100.4 F (38 C) OR HIGHER *CHILLS OR SWEATING *NAUSEA AND VOMITING THAT IS NOT CONTROLLED WITH YOUR NAUSEA MEDICATION *UNUSUAL SHORTNESS OF BREATH *UNUSUAL BRUISING OR BLEEDING *URINARY PROBLEMS (pain or burning when urinating, or frequent urination) *BOWEL PROBLEMS (unusual diarrhea, constipation, pain near the anus) TENDERNESS IN MOUTH AND THROAT WITH OR WITHOUT PRESENCE OF ULCERS (sore throat, sores in mouth, or a toothache) UNUSUAL RASH, SWELLING OR PAIN  UNUSUAL VAGINAL DISCHARGE OR ITCHING   Items with * indicate a potential emergency and should be followed up as soon as possible or go to the Emergency Department if any problems should occur.  Please show the CHEMOTHERAPY ALERT CARD or  IMMUNOTHERAPY ALERT CARD at check-in to the Emergency Department and triage nurse.  Should you have questions after your visit or need to cancel or reschedule your appointment, please contact [facility] - A DEPT OF [facility]  Dept: [phone]  and follow the prompts.  Office hours are 8:00 a.m. to 4:30 p.m. Monday - Friday. Please note that voicemails left after 4:00 p.m. may not be returned until the following business day.  We are closed weekends and major holidays. You have access to a nurse at all times for urgent questions. Please call the main number to the clinic Dept: [phone] and follow the prompts.   For any non-urgent questions, you may also contact your provider using MyChart. We now offer e-Visits for anyone 31 and older to request care online for non-urgent symptoms. For details visit [email or web address].   Also download the MyChart app! Go to the app store, search "MyChart", open the app, select [place], and log in with your MyChart username and password.  Panitumumab Injection What is this medication? PANITUMUMAB (pan i TOOM ue mab) treats colorectal cancer. It works by blocking a protein that causes cancer cells to grow and multiply. This helps to slow or stop the spread of cancer cells. It is a monoclonal antibody. This medicine may be used for other purposes; ask your health care provider or pharmacist if you have questions. COMMON BRAND NAME(S): Vectibix What should I tell my care team before I take this medication? They need to know if you have any of these conditions: Eye disease Low  levels of magnesium in the blood Lung disease An unusual or allergic reaction to panitumumab, other medications, foods, dyes, or preservatives Pregnant or trying to get pregnant Breast-feeding How should I use this medication? This medication is injected into a vein. It is given by your care team in a hospital or clinic setting. Talk to your  care team about the use of this medication in children. Special care may be needed. Overdosage: If you think you have taken too much of this medicine contact a poison control center or emergency room at once. NOTE: This medicine is only for you. Do not share this medicine with others. What if I miss a dose? Keep appointments for follow-up doses. It is important not to miss your dose. Call your care team if you are unable to keep an appointment. What may interact with this medication? Bevacizumab This list may not describe all possible interactions. Give your health care provider a list of all the medicines, herbs, non-prescription drugs, or dietary supplements you use. Also tell them if you smoke, drink alcohol, or use illegal drugs. Some items may interact with your medicine. What should I watch for while using this medication? Your condition will be monitored carefully while you are receiving this medication. This medication may make you feel generally unwell. This is not uncommon as chemotherapy can affect healthy cells as well as cancer cells. Report any side effects. Continue your course of treatment even though you feel ill unless your care team tells you to stop. This medication can make you more sensitive to the sun. Keep out of the sun while receiving this medication and for 2 months after stopping therapy. If you cannot avoid being in the sun, wear protective clothing and sunscreen. Do not use sun lamps, tanning beds, or tanning booths. Check with your care team if you have severe diarrhea, nausea, and vomiting or if you sweat a lot. The loss of too much body fluid may make it dangerous for you to take this medication. This medication may cause serious skin reactions. They can happen weeks to months after starting the medication. Contact your care team right away if you notice fevers or flu-like symptoms with a rash. The rash may be red or purple and then turn into blisters or peeling of the  skin. You may also notice a red rash with swelling of the face, lips, or lymph nodes in your neck or under your arms. Talk to your care team if you may be pregnant. Serious birth defects can occur if you take this medication during pregnancy and for 2 months after the last dose. Contraception is recommended while taking this medication and for 2 months after the last dose. Your care team can help you find the option that works for you. Do not breastfeed while taking this medication and for 2 months after the last dose. This medication may cause infertility. Talk to your care team if you are concerned about your fertility. What side effects may I notice from receiving this medication? Side effects that you should report to your care team as soon as possible: Allergic reactions--skin rash, itching, hives, swelling of the face, lips, tongue, or throat Dry cough, shortness of breath or trouble breathing Eye pain, redness, irritation, or discharge with blurry or decreased vision Infusion reactions--chest pain, shortness of breath or trouble breathing, feeling faint or lightheaded Low magnesium level--muscle pain or cramps, unusual weakness or fatigue, fast or irregular heartbeat, tremors Low potassium level--muscle pain or cramps, unusual weakness  or fatigue, fast or irregular heartbeat, constipation Redness, blistering, peeling, or loosening of the skin, including inside the mouth Skin reactions on sun-exposed areas Side effects that usually do not require medical attention (report to your care team if they continue or are bothersome): Change in nail shape, thickness, or color Diarrhea Dry skin Fatigue Nausea Vomiting This list may not describe all possible side effects. Call your doctor for medical advice about side effects. You may report side effects to FDA at 1-800-FDA-1088. Where should I keep my medication? This medication is given in a hospital or clinic. It will not be stored at  home. NOTE: This sheet is a summary. It may not cover all possible information. If you have questions about this medicine, talk to your doctor, pharmacist, or health care provider.  2024 Elsevier/Gold Standard (2021-09-04 00:00:00)

## 2023-04-15 NOTE — Addendum Note (Signed)
Addended by: Rana Snare on: 04/15/2023 03:38 PM   Modules accepted: Orders

## 2023-04-15 NOTE — Progress Notes (Signed)
Patient seen by Lonna Cobb NP today  Vitals are within treatment parameters:Yes   Labs are within treatment parameters: Yes   Treatment plan has been signed: Yes   Per physician team, Patient is ready for treatment and there are NO modifications to the treatment plan.

## 2023-04-16 ENCOUNTER — Telehealth: Payer: Self-pay

## 2023-04-16 NOTE — Telephone Encounter (Signed)
Notified patient of prior authorization approval for Lidocaine-Prilocaine 2.5% Cream. Medication is approved through 04/14/2024. No other needs or concerns noted at this time.

## 2023-04-30 ENCOUNTER — Inpatient Hospital Stay: Payer: Medicare Other

## 2023-04-30 ENCOUNTER — Telehealth (HOSPITAL_BASED_OUTPATIENT_CLINIC_OR_DEPARTMENT_OTHER): Payer: Self-pay | Admitting: Oncology

## 2023-04-30 ENCOUNTER — Inpatient Hospital Stay: Payer: Medicare Other | Admitting: Oncology

## 2023-04-30 ENCOUNTER — Other Ambulatory Visit: Payer: Self-pay | Admitting: *Deleted

## 2023-04-30 VITALS — BP 154/74 | HR 92 | Temp 98.1°F | Resp 18 | Ht 76.0 in | Wt 202.3 lb

## 2023-04-30 VITALS — BP 146/74 | HR 55 | Temp 98.4°F | Resp 18

## 2023-04-30 DIAGNOSIS — Z95828 Presence of other vascular implants and grafts: Secondary | ICD-10-CM

## 2023-04-30 DIAGNOSIS — Z5112 Encounter for antineoplastic immunotherapy: Secondary | ICD-10-CM | POA: Diagnosis not present

## 2023-04-30 DIAGNOSIS — N189 Chronic kidney disease, unspecified: Secondary | ICD-10-CM | POA: Diagnosis not present

## 2023-04-30 DIAGNOSIS — G620 Drug-induced polyneuropathy: Secondary | ICD-10-CM | POA: Diagnosis not present

## 2023-04-30 DIAGNOSIS — C189 Malignant neoplasm of colon, unspecified: Secondary | ICD-10-CM

## 2023-04-30 DIAGNOSIS — I129 Hypertensive chronic kidney disease with stage 1 through stage 4 chronic kidney disease, or unspecified chronic kidney disease: Secondary | ICD-10-CM | POA: Diagnosis not present

## 2023-04-30 DIAGNOSIS — D500 Iron deficiency anemia secondary to blood loss (chronic): Secondary | ICD-10-CM

## 2023-04-30 DIAGNOSIS — D649 Anemia, unspecified: Secondary | ICD-10-CM

## 2023-04-30 DIAGNOSIS — C778 Secondary and unspecified malignant neoplasm of lymph nodes of multiple regions: Secondary | ICD-10-CM | POA: Diagnosis not present

## 2023-04-30 DIAGNOSIS — C187 Malignant neoplasm of sigmoid colon: Secondary | ICD-10-CM | POA: Diagnosis not present

## 2023-04-30 LAB — CMP (CANCER CENTER ONLY)
ALT: <5 U/L — ABNORMAL LOW (ref 10–47)
AST: 7 U/L — ABNORMAL LOW (ref 11–38)
Albumin: 3 g/dL — ABNORMAL LOW (ref 3.5–5.0)
Alkaline Phosphatase: 82 U/L (ref 38–126)
Anion gap: 9 (ref 5–15)
BUN: 39 mg/dL — ABNORMAL HIGH (ref 8–23)
CO2: 19 mmol/L — ABNORMAL LOW (ref 22–32)
Calcium: 8.2 mg/dL — ABNORMAL LOW (ref 8.9–10.3)
Chloride: 113 mmol/L — ABNORMAL HIGH (ref 98–111)
Creatinine: 2.31 mg/dL — ABNORMAL HIGH (ref 0.60–1.20)
Glucose, Bld: 106 mg/dL — ABNORMAL HIGH (ref 70–99)
Potassium: 4.4 mmol/L (ref 3.5–5.1)
Sodium: 141 mmol/L (ref 135–145)
Total Bilirubin: 0.3 mg/dL (ref 0.2–1.6)
Total Protein: 5.9 g/dL — ABNORMAL LOW (ref 6.5–8.1)

## 2023-04-30 LAB — CBC WITH DIFFERENTIAL (CANCER CENTER ONLY)
Abs Immature Granulocytes: 0.04 10*3/uL (ref 0.00–0.07)
Basophils Absolute: 0.1 10*3/uL (ref 0.0–0.1)
Basophils Relative: 1 %
Eosinophils Absolute: 0.2 10*3/uL (ref 0.0–0.5)
Eosinophils Relative: 3 %
HCT: 25.1 % — ABNORMAL LOW (ref 39.0–52.0)
Hemoglobin: 7.7 g/dL — ABNORMAL LOW (ref 13.0–17.0)
Immature Granulocytes: 1 %
Lymphocytes Relative: 22 %
Lymphs Abs: 1.3 10*3/uL (ref 0.7–4.0)
MCH: 25.3 pg — ABNORMAL LOW (ref 26.0–34.0)
MCHC: 30.7 g/dL (ref 30.0–36.0)
MCV: 82.6 fL (ref 80.0–100.0)
Monocytes Absolute: 0.7 10*3/uL (ref 0.1–1.0)
Monocytes Relative: 11 %
Neutro Abs: 3.7 10*3/uL (ref 1.7–7.7)
Neutrophils Relative %: 62 %
Platelet Count: 230 10*3/uL (ref 150–400)
RBC: 3.04 MIL/uL — ABNORMAL LOW (ref 4.22–5.81)
RDW: 19.1 % — ABNORMAL HIGH (ref 11.5–15.5)
WBC Count: 6 10*3/uL (ref 4.0–10.5)
nRBC: 0 % (ref 0.0–0.2)

## 2023-04-30 LAB — FERRITIN: Ferritin: 7 ng/mL — ABNORMAL LOW (ref 24–336)

## 2023-04-30 LAB — PREPARE RBC (CROSSMATCH)

## 2023-04-30 LAB — MAGNESIUM: Magnesium: 1.7 mg/dL (ref 1.7–2.4)

## 2023-04-30 MED ORDER — SODIUM CHLORIDE 0.9 % IV SOLN
510.0000 mg | Freq: Once | INTRAVENOUS | Status: AC
  Administered 2023-04-30: 510 mg via INTRAVENOUS
  Filled 2023-04-30: qty 510

## 2023-04-30 MED ORDER — PANITUMUMAB CHEMO INJECTION 100 MG/5ML
4.0000 mg/kg | Freq: Once | INTRAVENOUS | Status: AC
  Administered 2023-04-30: 400 mg via INTRAVENOUS
  Filled 2023-04-30: qty 20

## 2023-04-30 MED ORDER — SODIUM CHLORIDE 0.9% IV SOLUTION: 250.0000 mL | INTRAVENOUS | Status: DC

## 2023-04-30 MED ORDER — SODIUM CHLORIDE 0.9 % IV SOLN
INTRAVENOUS | Status: DC
Start: 2023-04-30 — End: 2023-04-30

## 2023-04-30 MED ORDER — HEPARIN SOD (PORK) LOCK FLUSH 100 UNIT/ML IV SOLN
500.0000 [IU] | Freq: Every day | INTRAVENOUS | Status: AC | PRN
  Administered 2023-04-30: 500 [IU]

## 2023-04-30 MED ORDER — SODIUM CHLORIDE 0.9% FLUSH
10.0000 mL | INTRAVENOUS | Status: AC | PRN
  Administered 2023-04-30: 10 mL

## 2023-04-30 MED ORDER — SODIUM CHLORIDE 0.9% FLUSH
10.0000 mL | INTRAVENOUS | Status: DC | PRN
Start: 2023-04-30 — End: 2023-04-30

## 2023-04-30 MED ORDER — SODIUM CHLORIDE 0.9 % IV SOLN
Freq: Once | INTRAVENOUS | Status: DC
Start: 2023-04-30 — End: 2023-04-30

## 2023-04-30 MED ORDER — HEPARIN SOD (PORK) LOCK FLUSH 100 UNIT/ML IV SOLN: 500.0000 [IU] | Freq: Once | INTRAVENOUS | Status: DC | PRN

## 2023-04-30 NOTE — Progress Notes (Signed)
Patient seen by Dr. Thornton Papas today  Vitals are within treatment parameters:Yes   Labs are within treatment parameters: No (Please specify and give further instructions.)  Creatinine 2.31 and Hgb 7.7--OK to proceed Treatment plan has been signed: Yes   Per physician team, Patient is ready for treatment. Please note the following modifications:  Will attempt to transfuse 1 unit blood and IV iron today. IF not able to accommodate both, will do other tomorrow.

## 2023-04-30 NOTE — Progress Notes (Signed)
[facility] OFFICE PROGRESS NOTE   Diagnosis: Colon cancer  INTERVAL HISTORY:   [name] continues encorafenib.  He completed another treatment with panitumumab on 04/15/2023.  He missed a treatment in November due to a GI illness.  He reports developing nausea and vomiting after eating a sausage.  He had several episodes of nausea and vomiting over the next 2 weeks.  He has a bowel movement every few days.  Intermittent diarrhea.  He had nausea yesterday.  He is appetite has diminished.  He feels the left inguinal "cyst "has enlarged.  He has dyspnea.  Objective:  Vital signs in last 24 hours:  Blood pressure (!) 154/74, pulse 92, temperature 98.1 F (36.7 C), temperature source Temporal, resp. rate 18, height 6\' 4"  (1.93 m), weight 202 lb 4.8 oz (91.8 kg), SpO2 100%.    HEENT: No thrush or ulcers Lymphatics: Separate 1 cm mobile left axillary nodes, no right axillary nodes.  6-7 cm firm left inguinal node with a 2 cm superficial left inguinal nodule with early ulceration Resp: Lungs clear bilaterally Cardio: Regular rate and rhythm GI: No hepatosplenomegaly, nontender, no mass Vascular: No leg edema  Skin: Acne type rash over the posterior scalp, chest, and upper back  Portacath/PICC-without erythema  Lab Results:  Lab Results  Component Value Date   WBC 6.0 04/30/2023   HGB 7.7 (L) 04/30/2023   HCT 25.1 (L) 04/30/2023   MCV 82.6 04/30/2023   PLT 230 04/30/2023   NEUTROABS 3.7 04/30/2023    CMP  Lab Results  Component Value Date   NA 141 04/30/2023   K 4.4 04/30/2023   CL 113 (H) 04/30/2023   CO2 19 (L) 04/30/2023   GLUCOSE 106 (H) 04/30/2023   BUN 39 (H) 04/30/2023   CREATININE 2.31 (H) 04/30/2023   CALCIUM 8.2 (L) 04/30/2023   PROT 5.9 (L) 04/30/2023   ALBUMIN 3.0 (L) 04/30/2023   AST 7 (L) 04/30/2023   ALT <5 (L) 04/30/2023   ALKPHOS 82 04/30/2023   BILITOT 0.3 04/30/2023   GFRNONAA 27 (L) 04/15/2023   GFRAA 28 (L) 09/20/2019     Lab Results  Component Value Date   CEA1 2.61 01/02/2021   CEA 3.66 04/15/2023    Medications: I have reviewed the patient's current medications.   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), isolated mesenteric implant-resected, multiple tumor deposits Sigmoid/descending resection and creation of a descending colostomy 04/10/2016 MSI-stable, no loss of mismatch repair protein expression Foundation 1- BRAF V600E positive, MS-stable, intermediate tumor mutation burden, no RAS mutation CT abdomen/pelvis 04/02/2016-no evidence of distant metastatic disease Cycle 1 FOLFOX 05/21/2016 Cycle 2 FOLFOX 06/04/2016 Cycle 3 FOLFOX 06/18/2016 Cycle 4 FOLFOX 07/02/2016 (oxaliplatin held secondary to thrombocytopenia) Cycle 5 FOLFOX 07/16/2016 Cycle 6 FOLFOX 07/30/2016 Cycle 7 FOLFOX 08/13/2016 (oxaliplatin held and 5-FU dose reduced) Cycle 8 FOLFOX 09/03/2016 (oxaliplatin held secondary to neuropathy) Cycle 9 FOLFOX 09/17/2016 (oxaliplatin held secondary to neuropathy) Cycle 10 FOLFOX 10/02/2016 Cycle 11 FOLFOX 10/15/2016 (oxaliplatin eliminated from the regimen) Cycle 12 FOLFOX 10/30/2016 (oxaliplatin eliminated) CTs 05/12/2017-no evidence of recurrent disease, right inguinal hernia containing bladder Colonoscopy 07/21/2017, 3 polyps were removed from the descending and transverse colon, fragments of tubular and tubulovillous adenoma CTs 05/19/2018-no evidence of recurrent disease, status post hernia repair, right upper lobe pneumonia CTs 05/20/2019-resolution of right upper lobe pneumonia, no evidence of metastatic disease Colonoscopy 01/25/2020-multiple polyps removed-tubular adenomas, poor preparation, repeat colonoscopy recommended CTs neck, chest, and abdomen/pelvis 08/27/2020- new 2 centimeter left axillary node, new  1.9 cm left inguinal node chronic mildly prominent portacaval node Ultrasound biopsy of left inguinal node on 10/29/2020-metastatic adenocarcinoma consistent  with colon adenocarcinoma PET 11/12/2020-enlarged hypermetabolic left inguinal and left axillary nodes, solitary focus of hypermetabolic activity in the right lower quadrant small bowel Cycle 1 FOLFOX 12/19/2020 Cycle 2 FOLFOX 01/02/2021, oxaliplatin dose reduced secondary to neutropenia and thrombocytopenia Cycle 3 FOLFOX 01/16/2021 Cycle 4 FOLFOX 01/30/2021 CTs 02/11/2021-no change in left inguinal and left axillary nodes, no evidence of disease progression, stable wall thickening involving a loop of distal ileum Cycle 1 FOLFIRI/Avastin 02/27/2021 Cycle 2 FOLFIRI/Avastin 03/13/2021 Cycle 3 FOLFIRI/Avastin 04/03/2021 Cycle 4 FOLFIRI 04/17/2021, Avastin held due to elevated urine protein 05/07/2021-24-hour urine protein elevated 1562 Cycle 5 FOLFIRI 05/08/2021, Avastin held due to elevated urine protein, irinotecan dose reduced due to in general not feeling well after treatment CTs 05/17/2021-left axillary and left inguinal lymph nodes are smaller, no evidence of disease progression Cycle 6 FOLFIRI 05/22/2021, Avastin held due to proteinuria 06/03/2021 24-hour urine with 1.9 g of protein Cycle 7 FOLFIRI 06/12/2021, Avastin held due to proteinuria Cycle 8 FOLFIRI 07/03/2021, Avastin held due to proteinuria Cycle 9 FOLFIRI 07/24/2021, Avastin held due to proteinuria Cycle 10 FOLFIRI 08/19/2021, Avastin held due to proteinuria CTs 09/02/2021-no change in the left axillary, left inguinal, and portacaval lymph nodes.  No mention of the distal small bowel thickening Maintenance 5-fluorouracil 09/18/2021 CT abdomen/pelvis 10/28/2021-enlarged left inguinal lymph node, stable; stomach and small bowel grossly unremarkable. Maintenance 5-fluorouracil continued Colonoscopy 12/10/2021-terminal ileum with an ulcerated partially obstructing medium-sized mass approximately 15 to 20 cm from the IC valve, invasive adenocarcinoma, colonic type, moderately differentiated (grade 2) PET scan 12/18/2021-progression of disease in the  peritoneal space.  2 intensely hypermetabolic nodes in the left axilla, one node is new from the prior, the other is reduced in size and metabolic activity; decrease in size and metabolic activity of left inguinal adenopathy; intense metabolic activity associate with a loop of small bowel in the right lower quadrant, no interval change; new small hypermetabolic peritoneal implant along the ventral peritoneal surface just right of midline; larger new peritoneal implant in the deep right pelvis; small new implant in the right mid abdomen Encorafenib/Panitumumab 01/08/2022 CTs 03/17/2022-decrease size of mesenteric implant at the posterior bladder wall, small mesenteric peritoneal lesions identified on PET/CT or not evident.  Decrease size of left inguinal left axillary nodes, hypermetabolic activity noted at the anterior left prostate on comparison August 2023 PET CTs 07/21/2022-decrease size of a left axillary lymph node and right pelvic implant.  Left inguinal lymph node measures smaller.  No new sites of disease identified.  Similar tiny pulmonary nodules. Encorafenib and Panitumumab continued, Panitumumab changed from a 2-week schedule to a 3-week schedule 07/23/2022 CT 10/30/2022-stable left axillary lymph node, no new or progressive metastatic disease in the chest; stable left inguinal lymphadenopathy; decrease in size of deep right peritoneal implant; no new metastatic disease in the abdomen/pelvis. encorafenib and Panitumumab continued Panitumumab dose reduced 11/25/2022 due to skin rash CTs 02/20/2023-increased left inguinal adenopathy, developing left external iliac and ileocolic mesenteric adenopathy, unchanged left axillary node, enlarging pulmonary nodules minimal enlargement of cul-de-sac nodule Encorafenib and panitumumab continued Panitumumab every 2 weeks beginning 04/15/2023, continue encorafenib   2.   Chronic renal insufficiency   3.   Hypertension   4.  Inflamed sebaceous cyst at the  upper back-status post incision and drainage   5.  Thrombocytopenia secondary chemotherapy-oxaliplatin dose reduced beginning with cycle 3 FOLFOX, oxaliplatin held with cycle 4 and  cycle 7 FOLFOX   6.  Oxaliplatin neuropathy-improved   7.  History of Mucositis secondary chemotherapy   8.  Symptoms of pneumonia January 2020-CT 05/19/2018 consistent with right upper lobe pneumonia, Levaquin prescribed 05/20/2018   9.  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  Anemia, progressive 10/29/2021 2 units packed red blood cells 10/31/2021 Ferritin low 10/29/2021-oral iron Ferritin low 01/22/2022   12.  Colonoscopy 12/10/2021-terminal ileum with an ulcerated partially obstructing medium-sized mass approximately 15 to 20 cm from the IC valve, invasive adenocarcinoma, colonic type, moderately differentiated (grade 2) 13.  Admission 09/02/2022 with atrial flutter and rapid ventricular response 14.  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  He has been maintained on treatment with encorafenib and panitumumab since September 2023.  The left inguinal mass appears larger.  He will complete another treatment with panitumumab today.  He will undergo restaging CTs prior to an office visit in 2 weeks.  [name] has iron deficiency anemia.  He likely has iron deficiency secondary to chronic GI bleeding while on anticoagulation therapy.  He will receive a Red cell transfusion and IV iron over the next 2 days.  He will call if dyspnea does not improve following the Red cell transfusion.  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  Thornton Papas, MD  04/30/2023  11:50 AM

## 2023-04-30 NOTE — Patient Instructions (Signed)
[facility]   Discharge Instructions: Thank you for choosing [facility] to provide your oncology and hematology care.   If you have a lab appointment with the Cancer Center, please go directly to the Cancer Center and check in at the registration area.   Wear comfortable clothing and clothing appropriate for easy access to any Portacath or PICC line.   We strive to give you quality time with your provider. You may need to reschedule your appointment if you arrive late (15 or more minutes).  Arriving late affects you and other patients whose appointments are after yours.  Also, if you miss three or more appointments without notifying the office, you may be dismissed from the clinic at the provider's discretion.      For prescription refill requests, have your pharmacy contact our office and allow 72 hours for refills to be completed.    Today you received the following chemotherapy and/or immunotherapy agents Panitumumab (VECTIBIX).      To help prevent nausea and vomiting after your treatment, we encourage you to take your nausea medication as directed.  BELOW ARE SYMPTOMS THAT SHOULD BE REPORTED IMMEDIATELY: *FEVER GREATER THAN 100.4 F (38 C) OR HIGHER *CHILLS OR SWEATING *NAUSEA AND VOMITING THAT IS NOT CONTROLLED WITH YOUR NAUSEA MEDICATION *UNUSUAL SHORTNESS OF BREATH *UNUSUAL BRUISING OR BLEEDING *URINARY PROBLEMS (pain or burning when urinating, or frequent urination) *BOWEL PROBLEMS (unusual diarrhea, constipation, pain near the anus) TENDERNESS IN MOUTH AND THROAT WITH OR WITHOUT PRESENCE OF ULCERS (sore throat, sores in mouth, or a toothache) UNUSUAL RASH, SWELLING OR PAIN  UNUSUAL VAGINAL DISCHARGE OR ITCHING   Items with * indicate a potential emergency and should be followed up as soon as possible or go to the Emergency Department if any problems should occur.  Please show the CHEMOTHERAPY ALERT CARD or  IMMUNOTHERAPY ALERT CARD at check-in to the Emergency Department and triage nurse.  Should you have questions after your visit or need to cancel or reschedule your appointment, please contact [facility]  Dept: [phone]  and follow the prompts.  Office hours are 8:00 a.m. to 4:30 p.m. Monday - Friday. Please note that voicemails left after 4:00 p.m. may not be returned until the following business day.  We are closed weekends and major holidays. You have access to a nurse at all times for urgent questions. Please call the main number to the clinic Dept: [phone] and follow the prompts.   For any non-urgent questions, you may also contact your provider using MyChart. We now offer e-Visits for anyone 56 and older to request care online for non-urgent symptoms. For details visit [email or web address].   Also download the MyChart app! Go to the app store, search "MyChart", open the app, select [place], and log in with your MyChart username and password.  Panitumumab Injection What is this medication? PANITUMUMAB (pan i TOOM ue mab) treats colorectal cancer. It works by blocking a protein that causes cancer cells to grow and multiply. This helps to slow or stop the spread of cancer cells. It is a monoclonal antibody. This medicine may be used for other purposes; ask your health care provider or pharmacist if you have questions. COMMON BRAND NAME(S): Vectibix What should I tell my care team before I take this medication? They need to know if you have any of these conditions: Eye disease Low  levels of magnesium in the blood Lung disease An unusual or allergic reaction to panitumumab, other medications, foods, dyes, or preservatives Pregnant or trying to get pregnant Breast-feeding How should I use this medication? This medication is injected into a vein. It is given by your care team in a hospital or clinic setting. Talk to your  care team about the use of this medication in children. Special care may be needed. Overdosage: If you think you have taken too much of this medicine contact a poison control center or emergency room at once. NOTE: This medicine is only for you. Do not share this medicine with others. What if I miss a dose? Keep appointments for follow-up doses. It is important not to miss your dose. Call your care team if you are unable to keep an appointment. What may interact with this medication? Bevacizumab This list may not describe all possible interactions. Give your health care provider a list of all the medicines, herbs, non-prescription drugs, or dietary supplements you use. Also tell them if you smoke, drink alcohol, or use illegal drugs. Some items may interact with your medicine. What should I watch for while using this medication? Your condition will be monitored carefully while you are receiving this medication. This medication may make you feel generally unwell. This is not uncommon as chemotherapy can affect healthy cells as well as cancer cells. Report any side effects. Continue your course of treatment even though you feel ill unless your care team tells you to stop. This medication can make you more sensitive to the sun. Keep out of the sun while receiving this medication and for 2 months after stopping therapy. If you cannot avoid being in the sun, wear protective clothing and sunscreen. Do not use sun lamps, tanning beds, or tanning booths. Check with your care team if you have severe diarrhea, nausea, and vomiting or if you sweat a lot. The loss of too much body fluid may make it dangerous for you to take this medication. This medication may cause serious skin reactions. They can happen weeks to months after starting the medication. Contact your care team right away if you notice fevers or flu-like symptoms with a rash. The rash may be red or purple and then turn into blisters or peeling of the  skin. You may also notice a red rash with swelling of the face, lips, or lymph nodes in your neck or under your arms. Talk to your care team if you may be pregnant. Serious birth defects can occur if you take this medication during pregnancy and for 2 months after the last dose. Contraception is recommended while taking this medication and for 2 months after the last dose. Your care team can help you find the option that works for you. Do not breastfeed while taking this medication and for 2 months after the last dose. This medication may cause infertility. Talk to your care team if you are concerned about your fertility. What side effects may I notice from receiving this medication? Side effects that you should report to your care team as soon as possible: Allergic reactions--skin rash, itching, hives, swelling of the face, lips, tongue, or throat Dry cough, shortness of breath or trouble breathing Eye pain, redness, irritation, or discharge with blurry or decreased vision Infusion reactions--chest pain, shortness of breath or trouble breathing, feeling faint or lightheaded Low magnesium level--muscle pain or cramps, unusual weakness or fatigue, fast or irregular heartbeat, tremors Low potassium level--muscle pain or cramps, unusual weakness  or fatigue, fast or irregular heartbeat, constipation Redness, blistering, peeling, or loosening of the skin, including inside the mouth Skin reactions on sun-exposed areas Side effects that usually do not require medical attention (report to your care team if they continue or are bothersome): Change in nail shape, thickness, or color Diarrhea Dry skin Fatigue Nausea Vomiting This list may not describe all possible side effects. Call your doctor for medical advice about side effects. You may report side effects to FDA at 1-800-FDA-1088. Where should I keep my medication? This medication is given in a hospital or clinic. It will not be stored at  home. NOTE: This sheet is a summary. It may not cover all possible information. If you have questions about this medicine, talk to your doctor, pharmacist, or health care provider.  2024 Elsevier/Gold Standard (2021-09-04 00:00:00)   Ferumoxytol Injection What is this medication? FERUMOXYTOL (FER ue MOX i tol) treats low levels of iron in your body (iron deficiency anemia). Iron is a mineral that plays an important role in making red blood cells, which carry oxygen from your lungs to the rest of your body. This medicine may be used for other purposes; ask your health care provider or pharmacist if you have questions. COMMON BRAND NAME(S): Feraheme What should I tell my care team before I take this medication? They need to know if you have any of these conditions: Anemia not caused by low iron levels High levels of iron in the blood Magnetic resonance imaging (MRI) test scheduled An unusual or allergic reaction to iron, other medications, foods, dyes, or preservatives Pregnant or trying to get pregnant Breastfeeding How should I use this medication? This medication is injected into a vein. It is given by your care team in a hospital or clinic setting. Talk to your care team the use of this medication in children. Special care may be needed. Overdosage: If you think you have taken too much of this medicine contact a poison control center or emergency room at once. NOTE: This medicine is only for you. Do not share this medicine with others. What if I miss a dose? It is important not to miss your dose. Call your care team if you are unable to keep an appointment. What may interact with this medication? Other iron products This list may not describe all possible interactions. Give your health care provider a list of all the medicines, herbs, non-prescription drugs, or dietary supplements you use. Also tell them if you smoke, drink alcohol, or use illegal drugs. Some items may interact with  your medicine. What should I watch for while using this medication? Visit your care team regularly. Tell your care team if your symptoms do not start to get better or if they get worse. You may need blood work done while you are taking this medication. You may need to follow a special diet. Talk to your care team. Foods that contain iron include: whole grains/cereals, dried fruits, beans, or peas, leafy green vegetables, and organ meats (liver, kidney). What side effects may I notice from receiving this medication? Side effects that you should report to your care team as soon as possible: Allergic reactions--skin rash, itching, hives, swelling of the face, lips, tongue, or throat Low blood pressure--dizziness, feeling faint or lightheaded, blurry vision Shortness of breath Side effects that usually do not require medical attention (report to your care team if they continue or are bothersome): Flushing Headache Joint pain Muscle pain Nausea Pain, redness, or irritation at injection site  This list may not describe all possible side effects. Call your doctor for medical advice about side effects. You may report side effects to FDA at 1-800-FDA-1088. Where should I keep my medication? This medication is given in a hospital or clinic. It will not be stored at home. NOTE: This sheet is a summary. It may not cover all possible information. If you have questions about this medicine, talk to your doctor, pharmacist, or health care provider.  2024 Elsevier/Gold Standard (2022-09-26 00:00:00)   Blood Transfusion, Adult, Care After After a blood transfusion, it is common to have: Bruising and soreness at the IV site. A headache. Follow these instructions at home: Your doctor may give you more instructions. If you have problems, contact your doctor. Insertion site care     Follow instructions from your doctor about how to take care of your insertion site. This is where an IV tube was put into  your vein. Make sure you: Wash your hands with soap and water for at least 20 seconds before and after you change your bandage. If you cannot use soap and water, use hand sanitizer. Change your bandage as told by your doctor. Check your insertion site every day for signs of infection. Check for: Redness, swelling, or pain. Bleeding from the site. Warmth. Pus or a bad smell. General instructions Take over-the-counter and prescription medicines only as told by your doctor. Rest as told by your doctor. Go back to your normal activities as told by your doctor. Keep all follow-up visits. You may need to have tests at certain times to check your blood. Contact a doctor if: You have itching or red, swollen areas of skin (hives). You have a fever or chills. You have pain in the head, back, or chest. You feel worried or nervous (anxious). You feel weak after doing your normal activities. You have any of these problems at the insertion site: Redness, swelling, warmth, or pain. Bleeding that does not stop with pressure. Pus or a bad smell. If you received your blood transfusion in an outpatient setting, you will be told whom to contact to report any reactions. Get help right away if: You have signs of a serious reaction. This may be coming from an allergy or the body's defense system (immune system). Signs include: Trouble breathing or shortness of breath. Swelling of the face or feeling warm (flushed). A widespread rash. Dark pee (urine) or blood in the pee. Fast heartbeat. These symptoms may be an emergency. Get help right away. Call 911. Do not wait to see if the symptoms will go away. Do not drive yourself to the hospital. Summary Bruising and soreness at the IV site are common. Check your insertion site every day for signs of infection. Rest as told by your doctor. Go back to your normal activities as told by your doctor. Get help right away if you have signs of a serious  reaction. This information is not intended to replace advice given to you by your health care provider. Make sure you discuss any questions you have with your health care provider. Document Revised: 07/19/2021 Document Reviewed: 07/19/2021 Elsevier Patient Education  2024 [employer].

## 2023-05-01 ENCOUNTER — Other Ambulatory Visit: Payer: Self-pay | Admitting: *Deleted

## 2023-05-01 ENCOUNTER — Telehealth: Payer: Self-pay | Admitting: *Deleted

## 2023-05-01 LAB — TYPE AND SCREEN
ABO/RH(D): A POS
Antibody Screen: NEGATIVE
Unit division: 0

## 2023-05-01 LAB — BPAM RBC
Blood Product Expiration Date: 202501262359
ISSUE DATE / TIME: 202412261335
Unit Type and Rh: 6200

## 2023-05-01 MED ORDER — ENCORAFENIB 75 MG PO CAPS: 300.0000 mg | ORAL_CAPSULE | Freq: Every day | ORAL | 1 refills | Status: DC

## 2023-05-01 NOTE — Telephone Encounter (Signed)
Was able to move his 1/09 CT scan to [facility] on 05/03/23 at 1 pm arrival for scan at 3 pm. Mr. Kathe Mariner agreed to this. He also reports that beginning January 2025, his Braftovi needs to be dispensed by [facility].

## 2023-05-03 ENCOUNTER — Ambulatory Visit (HOSPITAL_BASED_OUTPATIENT_CLINIC_OR_DEPARTMENT_OTHER)
Admission: RE | Admit: 2023-05-03 | Discharge: 2023-05-03 | Disposition: A | Discharge status: Home / Self Care | Payer: Medicare Other | Source: Ambulatory Visit | Attending: Oncology | Admitting: Oncology

## 2023-05-03 DIAGNOSIS — C7800 Secondary malignant neoplasm of unspecified lung: Secondary | ICD-10-CM | POA: Diagnosis not present

## 2023-05-03 DIAGNOSIS — C189 Malignant neoplasm of colon, unspecified: Secondary | ICD-10-CM | POA: Diagnosis not present

## 2023-05-03 DIAGNOSIS — C787 Secondary malignant neoplasm of liver and intrahepatic bile duct: Secondary | ICD-10-CM | POA: Diagnosis not present

## 2023-05-03 DIAGNOSIS — N320 Bladder-neck obstruction: Secondary | ICD-10-CM | POA: Diagnosis not present

## 2023-05-05 ENCOUNTER — Telehealth: Payer: Self-pay

## 2023-05-05 ENCOUNTER — Encounter: Payer: Self-pay | Admitting: Oncology

## 2023-05-05 ENCOUNTER — Other Ambulatory Visit (HOSPITAL_COMMUNITY): Payer: Self-pay

## 2023-05-05 NOTE — Telephone Encounter (Signed)
 Oral Oncology Patient Advocate Encounter  **[employer] PAP to [facility] in Jan 2025**  Patient is no longer eligible for PAP through [employer] due to exceeding new income limits for 2025.   Was successful in securing patient a $10,000.00 grant from [employer] to provide copayment coverage for Braftovi .  This will keep the out of pocket expense at $0.00.     [employer] ID: [id number]  I have spoken with the patient.    The billing information is as follows and has been shared with [facility].    RxBin: [id number] PCN: [id number] Member ID: [id number] Group ID: [id number] Dates of Eligibility: 04/01/23 through 03/30/24  Fund:  Colorectal Carcinoma - Medicare Access   Morene Potters, CPhT Oncology Pharmacy Patient Advocate  [facility]  [phone] (phone) [phone] (fax) 05/05/2023 11:06 AM

## 2023-05-08 ENCOUNTER — Other Ambulatory Visit: Payer: Self-pay | Admitting: Oncology

## 2023-05-13 ENCOUNTER — Inpatient Hospital Stay: Payer: Medicare Other

## 2023-05-13 ENCOUNTER — Inpatient Hospital Stay: Payer: Medicare Other | Admitting: Nurse Practitioner

## 2023-05-13 ENCOUNTER — Telehealth: Payer: Self-pay | Admitting: Pharmacist

## 2023-05-13 ENCOUNTER — Inpatient Hospital Stay: Payer: Medicare Other | Attending: Oncology

## 2023-05-13 ENCOUNTER — Other Ambulatory Visit: Payer: Self-pay

## 2023-05-13 ENCOUNTER — Encounter: Payer: Self-pay | Admitting: Nurse Practitioner

## 2023-05-13 VITALS — BP 132/70 | HR 55 | Resp 18

## 2023-05-13 VITALS — BP 126/64 | HR 80 | Temp 98.1°F | Resp 18 | Ht 76.0 in | Wt 200.9 lb

## 2023-05-13 DIAGNOSIS — C778 Secondary and unspecified malignant neoplasm of lymph nodes of multiple regions: Secondary | ICD-10-CM | POA: Diagnosis not present

## 2023-05-13 DIAGNOSIS — C7800 Secondary malignant neoplasm of unspecified lung: Secondary | ICD-10-CM | POA: Insufficient documentation

## 2023-05-13 DIAGNOSIS — I129 Hypertensive chronic kidney disease with stage 1 through stage 4 chronic kidney disease, or unspecified chronic kidney disease: Secondary | ICD-10-CM | POA: Diagnosis not present

## 2023-05-13 DIAGNOSIS — D6959 Other secondary thrombocytopenia: Secondary | ICD-10-CM | POA: Diagnosis not present

## 2023-05-13 DIAGNOSIS — C189 Malignant neoplasm of colon, unspecified: Secondary | ICD-10-CM

## 2023-05-13 DIAGNOSIS — G620 Drug-induced polyneuropathy: Secondary | ICD-10-CM | POA: Insufficient documentation

## 2023-05-13 DIAGNOSIS — Z95828 Presence of other vascular implants and grafts: Secondary | ICD-10-CM

## 2023-05-13 DIAGNOSIS — N189 Chronic kidney disease, unspecified: Secondary | ICD-10-CM | POA: Diagnosis not present

## 2023-05-13 DIAGNOSIS — D649 Anemia, unspecified: Secondary | ICD-10-CM | POA: Diagnosis not present

## 2023-05-13 DIAGNOSIS — C187 Malignant neoplasm of sigmoid colon: Secondary | ICD-10-CM | POA: Diagnosis not present

## 2023-05-13 DIAGNOSIS — Z5112 Encounter for antineoplastic immunotherapy: Secondary | ICD-10-CM | POA: Insufficient documentation

## 2023-05-13 DIAGNOSIS — D500 Iron deficiency anemia secondary to blood loss (chronic): Secondary | ICD-10-CM

## 2023-05-13 LAB — CBC WITH DIFFERENTIAL (CANCER CENTER ONLY)
Abs Immature Granulocytes: 0.04 10*3/uL (ref 0.00–0.07)
Basophils Absolute: 0 10*3/uL (ref 0.0–0.1)
Basophils Relative: 1 %
Eosinophils Absolute: 0.3 10*3/uL (ref 0.0–0.5)
Eosinophils Relative: 5 %
HCT: 30.6 % — ABNORMAL LOW (ref 39.0–52.0)
Hemoglobin: 9.6 g/dL — ABNORMAL LOW (ref 13.0–17.0)
Immature Granulocytes: 1 %
Lymphocytes Relative: 12 %
Lymphs Abs: 0.6 10*3/uL — ABNORMAL LOW (ref 0.7–4.0)
MCH: 26.8 pg (ref 26.0–34.0)
MCHC: 31.4 g/dL (ref 30.0–36.0)
MCV: 85.5 fL (ref 80.0–100.0)
Monocytes Absolute: 0.8 10*3/uL (ref 0.1–1.0)
Monocytes Relative: 15 %
Neutro Abs: 3.7 10*3/uL (ref 1.7–7.7)
Neutrophils Relative %: 66 %
Platelet Count: 176 10*3/uL (ref 150–400)
RBC: 3.58 MIL/uL — ABNORMAL LOW (ref 4.22–5.81)
RDW: 20.7 % — ABNORMAL HIGH (ref 11.5–15.5)
WBC Count: 5.5 10*3/uL (ref 4.0–10.5)
nRBC: 0 % (ref 0.0–0.2)

## 2023-05-13 LAB — CMP (CANCER CENTER ONLY)
ALT: 5 U/L (ref 0–44)
AST: 9 U/L — ABNORMAL LOW (ref 15–41)
Albumin: 3.4 g/dL — ABNORMAL LOW (ref 3.5–5.0)
Alkaline Phosphatase: 78 U/L (ref 38–126)
Anion gap: 8 (ref 5–15)
BUN: 37 mg/dL — ABNORMAL HIGH (ref 8–23)
CO2: 19 mmol/L — ABNORMAL LOW (ref 22–32)
Calcium: 8.3 mg/dL — ABNORMAL LOW (ref 8.9–10.3)
Chloride: 110 mmol/L (ref 98–111)
Creatinine: 2.31 mg/dL — ABNORMAL HIGH (ref 0.61–1.24)
GFR, Estimated: 29 mL/min — ABNORMAL LOW (ref >60)
Glucose, Bld: 132 mg/dL — ABNORMAL HIGH (ref 70–99)
Potassium: 4.1 mmol/L (ref 3.5–5.1)
Sodium: 137 mmol/L (ref 135–145)
Total Bilirubin: 0.3 mg/dL (ref 0.0–1.2)
Total Protein: 6.4 g/dL — ABNORMAL LOW (ref 6.5–8.1)

## 2023-05-13 LAB — MAGNESIUM: Magnesium: 1.6 mg/dL — ABNORMAL LOW (ref 1.7–2.4)

## 2023-05-13 MED ORDER — SODIUM CHLORIDE 0.9% FLUSH
10.0000 mL | INTRAVENOUS | Status: DC | PRN
  Administered 2023-05-13: 10 mL via INTRAVENOUS

## 2023-05-13 MED ORDER — HEPARIN SOD (PORK) LOCK FLUSH 100 UNIT/ML IV SOLN
500.0000 [IU] | Freq: Once | INTRAVENOUS | Status: AC | PRN
  Administered 2023-05-13: 500 [IU]

## 2023-05-13 MED ORDER — SODIUM CHLORIDE 0.9% FLUSH
10.0000 mL | INTRAVENOUS | Status: DC | PRN
Start: 2023-05-13 — End: 2023-05-13
  Administered 2023-05-13: 10 mL

## 2023-05-13 MED ORDER — PANITUMUMAB CHEMO INJECTION 100 MG/5ML: 4.0000 mg/kg | Freq: Once | INTRAVENOUS | Status: DC

## 2023-05-13 MED ORDER — LONSURF 15-6.14 MG PO TABS
30.0000 mg | ORAL_TABLET | Freq: Two times a day (BID) | ORAL | 0 refills | Status: DC
  Filled 2023-05-14: qty 40, 28d supply, fill #0

## 2023-05-13 MED ORDER — MAGNESIUM SULFATE 2 GM/50ML IV SOLN
2.0000 g | Freq: Once | INTRAVENOUS | Status: AC
  Administered 2023-05-13: 2 g via INTRAVENOUS
  Filled 2023-05-13: qty 50

## 2023-05-13 MED ORDER — SODIUM CHLORIDE 0.9 % IV SOLN
510.0000 mg | Freq: Once | INTRAVENOUS | Status: AC
  Administered 2023-05-13: 510 mg via INTRAVENOUS
  Filled 2023-05-13: qty 510

## 2023-05-13 MED ORDER — LONSURF 20-8.19 MG PO TABS: ORAL_TABLET | ORAL | 0 refills | Status: DC

## 2023-05-13 MED ORDER — SODIUM CHLORIDE 0.9 % IV SOLN
Freq: Once | INTRAVENOUS | Status: DC
Start: 2023-05-13 — End: 2023-05-13

## 2023-05-13 MED ORDER — SODIUM CHLORIDE 0.9 % IV SOLN
INTRAVENOUS | Status: DC
Start: 2023-05-13 — End: 2023-05-13

## 2023-05-13 NOTE — Progress Notes (Signed)
 [facility] OFFICE PROGRESS NOTE   Diagnosis: Colon cancer  INTERVAL HISTORY:   Louis [name] returns as scheduled.  He continues encorafenib .  He completed another treatment with Panitumumab  04/30/2023.  He reports dyspnea this morning.  The dyspnea occurs intermittently.  No cough or fever.  Persistent skin rash.  Further enlargement of the left inguinal cyst.  Continued poor appetite.  Objective:  Vital signs in last 24 hours:  Blood pressure 126/64, pulse 80, temperature 98.1 F (36.7 C), temperature source Temporal, resp. rate 18, height 6' 4 (1.93 m), weight 200 lb 14.4 oz (91.1 kg), SpO2 100%.    HEENT: No thrush or ulcers. Lymphatics: Approximate 1 cm left axillary lymph node.  6 to 7 cm firm left inguinal mass with an area of nodularity/early ulceration. Resp: Lungs clear bilaterally. Cardio: Regular rate and rhythm. GI: No hepatosplenomegaly. Vascular: No leg edema. Skin: Acne type rash posterior scalp, face, chest, upper back. Port-A-Cath without erythema.  Lab Results:  Lab Results  Component Value Date   WBC 5.5 05/13/2023   HGB 9.6 (L) 05/13/2023   HCT 30.6 (L) 05/13/2023   MCV 85.5 05/13/2023   PLT 176 05/13/2023   NEUTROABS 3.7 05/13/2023    Imaging:  No results found.  Medications: I have reviewed the patient's current medications.  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), isolated mesenteric implant-resected, multiple tumor deposits Sigmoid/descending resection and creation of a descending colostomy 04/10/2016 MSI-stable, no loss of mismatch repair protein expression Foundation 1- BRAF V600E positive, MS-stable, intermediate tumor mutation burden, no RAS mutation CT abdomen/pelvis 04/02/2016-no evidence of distant metastatic disease Cycle 1 FOLFOX 05/21/2016 Cycle 2 FOLFOX 06/04/2016 Cycle 3 FOLFOX 06/18/2016 Cycle 4 FOLFOX 07/02/2016 (oxaliplatin  held secondary to thrombocytopenia) Cycle 5 FOLFOX  07/16/2016 Cycle 6 FOLFOX 07/30/2016 Cycle 7 FOLFOX 08/13/2016 (oxaliplatin  held and 5-FU dose reduced) Cycle 8 FOLFOX 09/03/2016 (oxaliplatin  held secondary to neuropathy) Cycle 9 FOLFOX 09/17/2016 (oxaliplatin  held secondary to neuropathy) Cycle 10 FOLFOX 10/02/2016 Cycle 11 FOLFOX 10/15/2016 (oxaliplatin  eliminated from the regimen) Cycle 12 FOLFOX 10/30/2016 (oxaliplatin  eliminated) CTs 05/12/2017-no evidence of recurrent disease, right inguinal hernia containing bladder Colonoscopy 07/21/2017, 3 polyps were removed from the descending and transverse colon, fragments of tubular and tubulovillous adenoma CTs 05/19/2018-no evidence of recurrent disease, status post hernia repair, right upper lobe pneumonia CTs 05/20/2019-resolution of right upper lobe pneumonia, no evidence of metastatic disease Colonoscopy 01/25/2020-multiple polyps removed-tubular adenomas, poor preparation, repeat colonoscopy recommended CTs neck, chest, and abdomen/pelvis 08/27/2020- new 2 centimeter left axillary node, new 1.9 cm left inguinal node chronic mildly prominent portacaval node Ultrasound biopsy of left inguinal node on 10/29/2020-metastatic adenocarcinoma consistent with colon adenocarcinoma PET 11/12/2020-enlarged hypermetabolic left inguinal and left axillary nodes, solitary focus of hypermetabolic activity in the right lower quadrant small bowel Cycle 1 FOLFOX 12/19/2020 Cycle 2 FOLFOX 01/02/2021, oxaliplatin  dose reduced secondary to neutropenia and thrombocytopenia Cycle 3 FOLFOX 01/16/2021 Cycle 4 FOLFOX 01/30/2021 CTs 02/11/2021-no change in left inguinal and left axillary nodes, no evidence of disease progression, stable wall thickening involving a loop of distal ileum Cycle 1 FOLFIRI/Avastin  02/27/2021 Cycle 2 FOLFIRI/Avastin  03/13/2021 Cycle 3 FOLFIRI/Avastin  04/03/2021 Cycle 4 FOLFIRI 04/17/2021, Avastin  held due to elevated urine protein 05/07/2021-24-hour urine protein elevated 1562 Cycle 5 FOLFIRI 05/08/2021,  Avastin  held due to elevated urine protein, irinotecan  dose reduced due to in general not feeling well after treatment CTs 05/17/2021-left axillary and left inguinal lymph nodes are smaller, no evidence of disease progression Cycle 6 FOLFIRI 05/22/2021, Avastin  held due to proteinuria  06/03/2021 24-hour urine with 1.9 g of protein Cycle 7 FOLFIRI 06/12/2021, Avastin  held due to proteinuria Cycle 8 FOLFIRI 07/03/2021, Avastin  held due to proteinuria Cycle 9 FOLFIRI 07/24/2021, Avastin  held due to proteinuria Cycle 10 FOLFIRI 08/19/2021, Avastin  held due to proteinuria CTs 09/02/2021-no change in the left axillary, left inguinal, and portacaval lymph nodes.  No mention of the distal small bowel thickening Maintenance 5-fluorouracil  09/18/2021 CT abdomen/pelvis 10/28/2021-enlarged left inguinal lymph node, stable; stomach and small bowel grossly unremarkable. Maintenance 5-fluorouracil  continued Colonoscopy 12/10/2021-terminal ileum with an ulcerated partially obstructing medium-sized mass approximately 15 to 20 cm from the IC valve, invasive adenocarcinoma, colonic type, moderately differentiated (grade 2) PET scan 12/18/2021-progression of disease in the peritoneal space.  2 intensely hypermetabolic nodes in the left axilla, one node is new from the prior, the other is reduced in size and metabolic activity; decrease in size and metabolic activity of left inguinal adenopathy; intense metabolic activity associate with a loop of small bowel in the right lower quadrant, no interval change; new small hypermetabolic peritoneal implant along the ventral peritoneal surface just right of midline; larger new peritoneal implant in the deep right pelvis; small new implant in the right mid abdomen Encorafenib /Panitumumab  01/08/2022 CTs 03/17/2022-decrease size of mesenteric implant at the posterior bladder wall, small mesenteric peritoneal lesions identified on PET/CT or not evident.  Decrease size of left inguinal left axillary  nodes, hypermetabolic activity noted at the anterior left prostate on comparison August 2023 PET CTs 07/21/2022-decrease size of a left axillary lymph node and right pelvic implant.  Left inguinal lymph node measures smaller.  No new sites of disease identified.  Similar tiny pulmonary nodules. Encorafenib  and Panitumumab  continued, Panitumumab  changed from a 2-week schedule to a 3-week schedule 07/23/2022 CT 10/30/2022-stable left axillary lymph node, no new or progressive metastatic disease in the chest; stable left inguinal lymphadenopathy; decrease in size of deep right peritoneal implant; no new metastatic disease in the abdomen/pelvis. encorafenib  and Panitumumab  continued Panitumumab  dose reduced 11/25/2022 due to skin rash CTs 02/20/2023-increased left inguinal adenopathy, developing left external iliac and ileocolic mesenteric adenopathy, unchanged left axillary node, enlarging pulmonary nodules minimal enlargement of cul-de-sac nodule Encorafenib  and panitumumab  continued Panitumumab  every 2 weeks beginning 04/15/2023, continue encorafenib  Restaging CTs 05/03/2023-new low-density liver lesions.  Progression of pelvic and less so abdominal nodal metastasis.  Mild progression of pulmonary metastasis.  Left axillary adenopathy improved. Lonsurf /Avastin  anticipated start date 05/18/2023   2.   Chronic renal insufficiency   3.   Hypertension   4.  Inflamed sebaceous cyst at the upper back-status post incision and drainage   5.  Thrombocytopenia secondary chemotherapy-oxaliplatin  dose reduced beginning with cycle 3 FOLFOX, oxaliplatin  held with cycle 4 and cycle 7 FOLFOX   6.  Oxaliplatin  neuropathy-improved   7.  History of Mucositis secondary chemotherapy   8.  Symptoms of pneumonia January 2020-CT 05/19/2018 consistent with right upper lobe pneumonia, Levaquin  prescribed 05/20/2018   9.  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   Anemia, progressive 10/29/2021 2 units packed red blood cells 10/31/2021 Ferritin low 10/29/2021-oral iron Ferritin low 01/22/2022   12.  Colonoscopy 12/10/2021-terminal ileum with an ulcerated partially obstructing medium-sized mass approximately 15 to 20 cm from the IC valve, invasive adenocarcinoma, colonic type, moderately differentiated (grade 2) 13.  Admission 09/02/2022 with atrial flutter and rapid ventricular response 14.  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  Recent restaging CT show evidence of progression.  Results/images reviewed with Louis [name] and his wife at today's visit.  They understand the recommendation to discontinue current treatment.  We discussed Lonsurf /Avastin .  We reviewed potential side effects associated with Lonsurf  including bone marrow toxicity, nausea, diarrhea, rash.  He has had Avastin  in the past.  We will obtain a baseline 24-hour urine for total protein as he had proteinuria with Avastin  before.  He agrees with this plan.  He was provided with printed information on Lonsurf .  Anticipate start date for cycle 1 Lonsurf  05/18/2023.  He will return for Avastin  05/20/2023.  We will see him in follow-up prior to Avastin  on 06/03/2023.  Magnesium  is mildly low today.  He will receive IV magnesium .  He is also scheduled for a second dose of Feraheme .  Patient seen with Dr. Cloretta.    Olam Ned ANP/GNP-BC   05/13/2023  11:52 AM  This was a shared visit with Olam Ned.  We reviewed the restaging CT findings and images with Louis [name] and his wife.  There is clinical and radiologic evidence of disease progression.  I recommend discontinuing encorafenib /panitumumab .  We discussed treatment options.  We discussed standard salvage systemic therapy with Lonsurf /bevacizumab  and comfort care.  We also discussed referring him back to Dr. Brenna to consider a clinical trial.  He prefers proceeding with  Lonsurf /bevacizumab .  We reviewed potential toxicities associated with this regimen.  He agrees to proceed.  The Lonsurf  will be dose reduced secondary to renal insufficiency.  A treatment plan was entered today.  I was present for greater than 50% of today's visit.  I performed medical decision making.  Arvella Cloretta, MD

## 2023-05-13 NOTE — Progress Notes (Signed)
 DISCONTINUE OFF PATHWAY REGIMEN - Colorectal   [id number]:FOLFIRI + Bevacizumab (Leucovorin IV D1 + Fluorouracil IV D1/CIV D1,2 + Irinotecan IV D1 + Bevacizumab IV D1) q14 Days:   A cycle is every 14 days:     Bevacizumab-xxxx      Irinotecan      Leucovorin      Fluorouracil      Fluorouracil   **Always confirm dose/schedule in your pharmacy ordering system**  PRIOR TREATMENT: Off Pathway: FOLFIRI + Bevacizumab (Leucovorin IV D1 + Fluorouracil IV D1/CIV D1,2 + Irinotecan IV D1 + Bevacizumab IV D1) q14 Days  START OFF PATHWAY REGIMEN - Colorectal   [id number]:Bevacizumab 5 mg/kg IV D1,15 + Trifluridine/Tipiracil 35 mg/m2 PO BID D1-5, 8-12 q28 Days:   A cycle is every 28 days:     Trifluridine and tipiracil      Bevacizumab-xxxx   **Always confirm dose/schedule in your pharmacy ordering system**  Patient Characteristics: Distant Metastases, Nonsurgical Candidate, BRAF V600 Mutation Positive (KRAS/NRAS Wild-Type), Standard Cytotoxic/Targeted Therapy, Third Line Standard Cytotoxic/Targeted Therapy Tumor Location: Colon Therapeutic Status: Distant Metastases Microsatellite/Mismatch Repair Status: MSS/pMMR BRAF Mutation Status: Mutation Positive KRAS/NRAS Mutation Status: Wild-Type (no mutation) Standard Cytotoxic/Targeted Line of Therapy: Third Line Standard Cytotoxic/Targeted Therapy Intent of Therapy: Non-Curative / Palliative Intent, Discussed with Patient

## 2023-05-13 NOTE — Patient Instructions (Addendum)
 Hypomagnesemia Hypomagnesemia is a condition in which the level of magnesium  in the blood is too low. Magnesium  is a mineral that is found in many foods. It is used in many different processes in the body. Hypomagnesemia can affect every organ in the body. In severe cases, it can cause life-threatening problems. What are the causes? This condition may be caused by: Not getting enough magnesium  in your diet or not having enough healthy foods to eat (malnutrition). Problems with magnesium  absorption in the intestines. Dehydration. Excessive use of alcohol. Vomiting. Severe or long-term (chronic) diarrhea. Some medicines, including medicines that make you urinate more often (diuretics). Certain diseases, such as kidney disease, diabetes, celiac disease, and overactive thyroid. What are the signs or symptoms? Symptoms of this condition include: Loss of appetite, nausea, and vomiting. Involuntary shaking or trembling of a body part (tremor). Muscle weakness or tingling in the arms and legs. Sudden tightening of muscles (muscle spasms). Confusion. Psychiatric issues, such as: Depression and irritability. Psychosis. A feeling of fluttering of the heart (palpitations). Seizures. These symptoms are more severe if magnesium  levels drop suddenly. How is this diagnosed? This condition may be diagnosed based on: Your symptoms and medical history. A physical exam. Blood and urine tests. How is this treated? Treatment depends on the cause and the severity of the condition. It may be treated by: Taking a magnesium  supplement. This can be taken in pill form. If the condition is severe, magnesium  is usually given through an IV. Making changes to your diet. You may be directed to eat foods that have a lot of magnesium , such as green leafy vegetables, peas, beans, and nuts. Not drinking alcohol. If you are struggling not to drink, ask your health care provider for help. Follow these instructions at  home: Eating and drinking     Make sure that your diet includes foods with magnesium . Foods that have a lot of magnesium  in them include: Green leafy vegetables, such as spinach and broccoli. Beans and peas. Nuts and seeds, such as almonds and sunflower seeds. Whole grains, such as whole grain bread and fortified cereals. Drink fluids that contain salts and minerals (electrolytes), such as sports drinks, when you are active. Do not drink alcohol. General instructions Take over-the-counter and prescription medicines only as told by your health care provider. Take magnesium  supplements as directed if your health care provider tells you to take them. Have your magnesium  levels monitored as told by your health care provider. Keep all follow-up visits. This is important. Contact a health care provider if: You get worse instead of better. Your symptoms return. Get help right away if: You develop severe muscle weakness. You have trouble breathing. You feel that your heart is racing. These symptoms may represent a serious problem that is an emergency. Do not wait to see if the symptoms will go away. Get medical help right away. Call your local emergency services (911 in the [place] Do not drive yourself to the hospital. Summary Hypomagnesemia is a condition in which the level of magnesium  in the blood is too low. Hypomagnesemia can affect every organ in the body. Treatment may include eating more foods that contain magnesium , taking magnesium  supplements, and not drinking alcohol. Have your magnesium  levels monitored as told by your health care provider. This information is not intended to replace advice given to you by your health care provider. Make sure you discuss any questions you have with your health care provider. Document Revised: 09/18/2020 Document Reviewed: 09/18/2020 Elsevier Patient Education  2024 Elsevier Inc.   Ferumoxytol  Injection What is this medication? FERUMOXYTOL   (FER ue MOX i tol) treats low levels of iron in your body (iron deficiency anemia). Iron is a mineral that plays an important role in making red blood cells, which carry oxygen from your lungs to the rest of your body. This medicine may be used for other purposes; ask your health care provider or pharmacist if you have questions. COMMON BRAND NAME(S): Feraheme  What should I tell my care team before I take this medication? They need to know if you have any of these conditions: Anemia not caused by low iron levels High levels of iron in the blood Magnetic resonance imaging (MRI) test scheduled An unusual or allergic reaction to iron, other medications, foods, dyes, or preservatives Pregnant or trying to get pregnant Breastfeeding How should I use this medication? This medication is injected into a vein. It is given by your care team in a hospital or clinic setting. Talk to your care team the use of this medication in children. Special care may be needed. Overdosage: If you think you have taken too much of this medicine contact a poison control center or emergency room at once. NOTE: This medicine is only for you. Do not share this medicine with others. What if I miss a dose? It is important not to miss your dose. Call your care team if you are unable to keep an appointment. What may interact with this medication? Other iron products This list may not describe all possible interactions. Give your health care provider a list of all the medicines, herbs, non-prescription drugs, or dietary supplements you use. Also tell them if you smoke, drink alcohol, or use illegal drugs. Some items may interact with your medicine. What should I watch for while using this medication? Visit your care team regularly. Tell your care team if your symptoms do not start to get better or if they get worse. You may need blood work done while you are taking this medication. You may need to follow a special diet. Talk to  your care team. Foods that contain iron include: whole grains/cereals, dried fruits, beans, or peas, leafy green vegetables, and organ meats (liver, kidney). What side effects may I notice from receiving this medication? Side effects that you should report to your care team as soon as possible: Allergic reactions--skin rash, itching, hives, swelling of the face, lips, tongue, or throat Low blood pressure--dizziness, feeling faint or lightheaded, blurry vision Shortness of breath Side effects that usually do not require medical attention (report to your care team if they continue or are bothersome): Flushing Headache Joint pain Muscle pain Nausea Pain, redness, or irritation at injection site This list may not describe all possible side effects. Call your doctor for medical advice about side effects. You may report side effects to FDA at 1-800-FDA-1088. Where should I keep my medication? This medication is given in a hospital or clinic. It will not be stored at home. NOTE: This sheet is a summary. It may not cover all possible information. If you have questions about this medicine, talk to your doctor, pharmacist, or health care provider.  2024 Elsevier/Gold Standard (2022-09-26 00:00:00)

## 2023-05-13 NOTE — Telephone Encounter (Signed)
 Clinical Pharmacist Practitioner Encounter   New authorization  Prior Authorization for Lonsurf  has been approved.     PA# [id number] Effective dates: 05/13/23 through 05/12/24  Copay: $1978.45  Patient has grant from [facility] to cover copay   Oral Oncology Clinic will continue to follow.   Nizhoni Parlow N. Fritz Cauthon, PharmD, BCPS. [facility] Hematology/Oncology Clinical Pharmacist [facility] [phone]  05/13/2023 4:26 PM

## 2023-05-13 NOTE — Telephone Encounter (Signed)
 Clinical Pharmacist Practitioner Encounter  New authorization  Received notification from [facility] that prior authorization for Lonsurf  is required.   PA submitted on CMM Key [id number] Status is pending   Oral Oncology Clinic will continue to follow.   Nickie Deren N. Melane Windholz, PharmD, BCPS, [facility] Hematology/Oncology Clinical Pharmacist [place] [phone]  05/13/2023 3:36 PM

## 2023-05-13 NOTE — Progress Notes (Signed)
 Patient seen by Olam Ned NP today  Vitals are within treatment parameters:Yes   Labs are within treatment parameters: No (Please specify and give further instructions.) mag 1.6.patient will received 2g mag. Treatment plan has been signed: Yes   Per physician team, Patient will not be receiving treatment today.- 2g mag.

## 2023-05-13 NOTE — Telephone Encounter (Signed)
 Clinical Pharmacist Practitioner Encounter   Received new prescription for Lonsurf  (trifluridine /tipiracil ) for the treatment of metastatic colon cancer in conjunction with bevacizumab , planned duration until disease progression or unacceptable drug toxicity.  CMP from 05/13/23 assessed, SCr elevated at 2.31, CrCl ~33. MD is starting patient on a reduced dose that works for his renal impairment. Prescription dose and frequency assessed.   Current medication list in Epic reviewed, no DDIs with Lonsurf  identified.   Evaluated chart and no patient barriers to medication adherence identified.   Prescription has been e-scribed to the [facility] for benefits analysis and approval.  Oral Oncology Clinic will continue to follow for insurance authorization, copayment issues, initial counseling and start date.   Versie Fleener N. Aviah Sorci, PharmD, BCPS, BCOP, CPP Hematology/Oncology Clinical Pharmacist Practitioner [place] [phone]  05/13/2023 3:06 PM

## 2023-05-14 ENCOUNTER — Other Ambulatory Visit (HOSPITAL_COMMUNITY): Payer: Self-pay

## 2023-05-14 ENCOUNTER — Other Ambulatory Visit: Payer: Self-pay

## 2023-05-14 ENCOUNTER — Encounter: Payer: Self-pay | Admitting: Oncology

## 2023-05-14 ENCOUNTER — Ambulatory Visit (HOSPITAL_BASED_OUTPATIENT_CLINIC_OR_DEPARTMENT_OTHER): Payer: Medicare Other

## 2023-05-14 NOTE — Progress Notes (Signed)
 Specialty Pharmacy Initial Fill Coordination Note  Louis [name]. is a 78 y.o. male contacted today regarding initial fill of specialty medication(s) Trifluridine -Tipiracil  (Lonsurf )  Patient requested Delivery   Delivery date: 05/19/23   Verified address: [address], [place], [place] [address]  Medication will be filled on 05/18/23.   Patient is aware of $0.00 copayment. Bill [employer] Secondary.   Morene Potters, CPhT Oncology Pharmacy Patient Advocate  [facility]  [phone] (phone) [phone] (fax) 05/14/2023 9:40 AM

## 2023-05-14 NOTE — Progress Notes (Signed)
 Patient education documented in EPIC note on 05/14/23.

## 2023-05-14 NOTE — Telephone Encounter (Signed)
 Patient successfully OnBoarded and drug education provided by pharmacist. Medication scheduled to be shipped on Monday, 05/18/23, for delivery on Tuesday, 05/19/23, from [facility] to patient's address. Patient also knows to call me at [phone] with any questions or concerns regarding receiving medication or if there is any unexpected change in co-pay.    Morene Potters, CPhT Oncology Pharmacy Patient Advocate  [facility]  [phone] (phone) [phone] (fax) 05/14/2023 9:49 AM

## 2023-05-14 NOTE — Telephone Encounter (Signed)
 Clinical Pharmacist Practitioner Encounter   [facility] (Specialty) will delivery medication to patient on 05/19/23, patient will start his Lonsurf  on 05/20/23  Patient Education I spoke with patient for overview of new oral chemotherapy medication: Lonsurf  (trifluridine /tipiracil ) for the treatment of metastatic colon cancer in conjunction with bevacizumab , planned duration until disease progression or unacceptable drug toxicity.   Counseled patient on administration, dosing, side effects, monitoring, drug-food interactions, safe handling, storage, and disposal. Patient will take 2 tablets (30 mg of trifluridine  total) by mouth 2 (two) times daily after a meal. Take within 1 hr after AM & PM meals on days 1-5, 8-12. Repeat every 28 days. .  Side effects include but not limited to: nausea, diarrhea, fatigue, decreased wbc/hgb/plt.   Diarrhea: patient will use loperamide  as needed and call the office if he is having 4 or more loose stools per day.   Reviewed with patient importance of keeping a medication schedule and plan for any missed doses.  After discussion with patient no patient barriers to medication adherence identified.   Mr. Laurie voiced understanding and appreciation. All questions answered. Medication handout provided.  Provided patient with Oral Chemotherapy Navigation Clinic phone number. Patient knows to call the office with questions or concerns. Oral Chemotherapy Navigation Clinic will continue to follow.  Peggyann Zwiefelhofer N. Khayman Kirsch, PharmD, BCPS, BCOP, CPP Hematology/Oncology Clinical Pharmacist Practitioner [place] [phone]  05/14/2023 9:37 AM

## 2023-05-15 ENCOUNTER — Other Ambulatory Visit: Payer: Self-pay

## 2023-05-16 ENCOUNTER — Other Ambulatory Visit: Payer: Self-pay | Admitting: Oncology

## 2023-05-17 ENCOUNTER — Other Ambulatory Visit: Payer: Self-pay

## 2023-05-20 ENCOUNTER — Inpatient Hospital Stay: Payer: Medicare Other

## 2023-05-20 ENCOUNTER — Other Ambulatory Visit (HOSPITAL_BASED_OUTPATIENT_CLINIC_OR_DEPARTMENT_OTHER): Payer: Self-pay

## 2023-05-20 VITALS — BP 143/65 | HR 55 | Temp 98.1°F | Resp 18

## 2023-05-20 DIAGNOSIS — C189 Malignant neoplasm of colon, unspecified: Secondary | ICD-10-CM

## 2023-05-20 DIAGNOSIS — N189 Chronic kidney disease, unspecified: Secondary | ICD-10-CM | POA: Diagnosis not present

## 2023-05-20 DIAGNOSIS — C187 Malignant neoplasm of sigmoid colon: Secondary | ICD-10-CM | POA: Diagnosis not present

## 2023-05-20 DIAGNOSIS — Z5112 Encounter for antineoplastic immunotherapy: Secondary | ICD-10-CM | POA: Diagnosis not present

## 2023-05-20 DIAGNOSIS — I129 Hypertensive chronic kidney disease with stage 1 through stage 4 chronic kidney disease, or unspecified chronic kidney disease: Secondary | ICD-10-CM | POA: Diagnosis not present

## 2023-05-20 DIAGNOSIS — C778 Secondary and unspecified malignant neoplasm of lymph nodes of multiple regions: Secondary | ICD-10-CM | POA: Diagnosis not present

## 2023-05-20 DIAGNOSIS — C7800 Secondary malignant neoplasm of unspecified lung: Secondary | ICD-10-CM | POA: Diagnosis not present

## 2023-05-20 LAB — CMP (CANCER CENTER ONLY)
ALT: 6 U/L (ref 0–44)
AST: 11 U/L — ABNORMAL LOW (ref 15–41)
Albumin: 3.2 g/dL — ABNORMAL LOW (ref 3.5–5.0)
Alkaline Phosphatase: 77 U/L (ref 38–126)
Anion gap: 5 (ref 5–15)
BUN: 26 mg/dL — ABNORMAL HIGH (ref 8–23)
CO2: 23 mmol/L (ref 22–32)
Calcium: 8.3 mg/dL — ABNORMAL LOW (ref 8.9–10.3)
Chloride: 109 mmol/L (ref 98–111)
Creatinine: 2.01 mg/dL — ABNORMAL HIGH (ref 0.61–1.24)
GFR, Estimated: 34 mL/min — ABNORMAL LOW (ref >60)
Glucose, Bld: 130 mg/dL — ABNORMAL HIGH (ref 70–99)
Potassium: 4.3 mmol/L (ref 3.5–5.1)
Sodium: 137 mmol/L (ref 135–145)
Total Bilirubin: 0.3 mg/dL (ref 0.0–1.2)
Total Protein: 6.2 g/dL — ABNORMAL LOW (ref 6.5–8.1)

## 2023-05-20 LAB — MAGNESIUM: Magnesium: 1.9 mg/dL (ref 1.7–2.4)

## 2023-05-20 LAB — CBC WITH DIFFERENTIAL (CANCER CENTER ONLY)
Abs Immature Granulocytes: 0.02 10*3/uL (ref 0.00–0.07)
Basophils Absolute: 0 10*3/uL (ref 0.0–0.1)
Basophils Relative: 1 %
Eosinophils Absolute: 0.2 10*3/uL (ref 0.0–0.5)
Eosinophils Relative: 3 %
HCT: 29.9 % — ABNORMAL LOW (ref 39.0–52.0)
Hemoglobin: 9.3 g/dL — ABNORMAL LOW (ref 13.0–17.0)
Immature Granulocytes: 0 %
Lymphocytes Relative: 23 %
Lymphs Abs: 1.3 10*3/uL (ref 0.7–4.0)
MCH: 27.2 pg (ref 26.0–34.0)
MCHC: 31.1 g/dL (ref 30.0–36.0)
MCV: 87.4 fL (ref 80.0–100.0)
Monocytes Absolute: 0.5 10*3/uL (ref 0.1–1.0)
Monocytes Relative: 9 %
Neutro Abs: 3.5 10*3/uL (ref 1.7–7.7)
Neutrophils Relative %: 64 %
Platelet Count: 206 10*3/uL (ref 150–400)
RBC: 3.42 MIL/uL — ABNORMAL LOW (ref 4.22–5.81)
RDW: 21.6 % — ABNORMAL HIGH (ref 11.5–15.5)
WBC Count: 5.5 10*3/uL (ref 4.0–10.5)
nRBC: 0 % (ref 0.0–0.2)

## 2023-05-20 LAB — CEA (ACCESS): CEA (CHCC): 4.13 ng/mL (ref 0.00–5.00)

## 2023-05-20 LAB — PROTEIN, URINE, 24 HOUR
Collection Interval-UPROT: 24 h
Protein, 24H Urine: 675 mg/d — ABNORMAL HIGH (ref 50–100)
Protein, Urine: 45 mg/dL
Urine Total Volume-UPROT: 1500 mL

## 2023-05-20 MED ORDER — HEPARIN SOD (PORK) LOCK FLUSH 100 UNIT/ML IV SOLN
500.0000 [IU] | Freq: Once | INTRAVENOUS | Status: AC | PRN
Start: 2023-05-20 — End: 2023-05-20
  Administered 2023-05-20: 500 [IU]

## 2023-05-20 MED ORDER — SODIUM CHLORIDE 0.9% FLUSH
10.0000 mL | INTRAVENOUS | Status: DC | PRN
  Administered 2023-05-20: 10 mL

## 2023-05-20 MED ORDER — SODIUM CHLORIDE 0.9 % IV SOLN: INTRAVENOUS | Status: DC

## 2023-05-20 MED ORDER — SODIUM CHLORIDE 0.9 % IV SOLN
5.0000 mg/kg | Freq: Once | INTRAVENOUS | Status: AC
  Administered 2023-05-20: 500 mg via INTRAVENOUS
  Filled 2023-05-20: qty 16

## 2023-05-20 NOTE — Patient Instructions (Signed)
 [facility] A DE[facility] OF [facility]  Discharge Instructions: Thank you for choosing [facility] to provide your oncology and hematology care.   If you have a lab appointment with the Cancer Center, please go directly to the Cancer Center and check in at the registration area.   Wear comfortable clothing and clothing appropriate for easy access to any Portacath or PICC line.   We strive to give you quality time with your provider. You may need to reschedule your appointment if you arrive late (15 or more minutes).  Arriving late affects you and other patients whose appointments are after yours.  Also, if you miss three or more appointments without notifying the office, you may be dismissed from the clinic at the provider's discretion.      For prescription refill requests, have your pharmacy contact our office and allow 72 hours for refills to be completed.    Today you received the following chemotherapy and/or immunotherapy agents bevacizumab   To help prevent nausea and vomiting after your treatment, we encourage you to take your nausea medication as directed.  BELOW ARE SYMPTOMS THAT SHOULD BE REPORTED IMMEDIATELY: *FEVER GREATER THAN 100.4 F (38 C) OR HIGHER *CHILLS OR SWEATING *NAUSEA AND VOMITING THAT IS NOT CONTROLLED WITH YOUR NAUSEA MEDICATION *UNUSUAL SHORTNESS OF BREATH *UNUSUAL BRUISING OR BLEEDING *URINARY PROBLEMS (pain or burning when urinating, or frequent urination) *BOWEL PROBLEMS (unusual diarrhea, constipation, pain near the anus) TENDERNESS IN MOUTH AND THROAT WITH OR WITHOUT PRESENCE OF ULCERS (sore throat, sores in mouth, or a toothache) UNUSUAL RASH, SWELLING OR PAIN  UNUSUAL VAGINAL DISCHARGE OR ITCHING   Items with * indicate a potential emergency and should be followed up as soon as possible or go to the Emergency Department if any problems should occur.  Please show the CHEMOTHERAPY ALERT CARD or IMMUNOTHERAPY  ALERT CARD at check-in to the Emergency Department and triage nurse.  Should you have questions after your visit or need to cancel or reschedule your appointment, please contact [facility]  Dept: [phone]  and follow the prompts.  Office hours are 8:00 a.m. to 4:30 p.m. Monday - Friday. Please note that voicemails left after 4:00 p.m. may not be returned until the following business day.  We are closed weekends and major holidays. You have access to a nurse at all times for urgent questions. Please call the main number to the clinic Dept: [phone] and follow the prompts.   For any non-urgent questions, you may also contact your provider using MyChart. We now offer e-Visits for anyone 37 and older to request care online for non-urgent symptoms. For details visit [email or web address].   Also download the MyChart app! Go to the app store, search "MyChart", open the app, select [place], and log in with your MyChart username and password.  Bevacizumab Injection What is this medication? BEVACIZUMAB (be va SIZ yoo mab) treats some types of cancer. It works by blocking a protein that causes cancer cells to grow and multiply. This helps to slow or stop the spread of cancer cells. It is a monoclonal antibody. This medicine may be used for other purposes; ask your health care provider or pharmacist if you have questions. COMMON BRAND NAME(S): Alymsys, Avastin, MVASI, Omer Jack What should I tell my care team before I take this medication? They need to know if you have any of these conditions: Blood clots Coughing  up blood Having or recent surgery Heart failure High blood pressure History of a connection between 2 or more body parts that do not usually connect (fistula) History of a tear in your stomach or intestines Protein in your urine An unusual or allergic reaction to bevacizumab, other medications, foods, dyes, or  preservatives Pregnant or trying to get pregnant Breast-feeding How should I use this medication? This medication is injected into a vein. It is given by your care team in a hospital or clinic setting. Talk to your care team the use of this medication in children. Special care may be needed. Overdosage: If you think you have taken too much of this medicine contact a poison control center or emergency room at once. NOTE: This medicine is only for you. Do not share this medicine with others. What if I miss a dose? Keep appointments for follow-up doses. It is important not to miss your dose. Call your care team if you are unable to keep an appointment. What may interact with this medication? Interactions are not expected. This list may not describe all possible interactions. Give your health care provider a list of all the medicines, herbs, non-prescription drugs, or dietary supplements you use. Also tell them if you smoke, drink alcohol, or use illegal drugs. Some items may interact with your medicine. What should I watch for while using this medication? Your condition will be monitored carefully while you are receiving this medication. You may need blood work while taking this medication. This medication may make you feel generally unwell. This is not uncommon as chemotherapy can affect healthy cells as well as cancer cells. Report any side effects. Continue your course of treatment even though you feel ill unless your care team tells you to stop. This medication may increase your risk to bruise or bleed. Call your care team if you notice any unusual bleeding. Before having surgery, talk to your care team to make sure it is ok. This medication can increase the risk of poor healing of your surgical site or wound. You will need to stop this medication for 28 days before surgery. After surgery, wait at least 28 days before restarting this medication. Make sure the surgical site or wound is healed enough  before restarting this medication. Talk to your care team if questions. Talk to your care team if you may be pregnant. Serious birth defects can occur if you take this medication during pregnancy and for 6 months after the last dose. Contraception is recommended while taking this medication and for 6 months after the last dose. Your care team can help you find the option that works for you. Do not breastfeed while taking this medication and for 6 months after the last dose. This medication can cause infertility. Talk to your care team if you are concerned about your fertility. What side effects may I notice from receiving this medication? Side effects that you should report to your care team as soon as possible: Allergic reactions--skin rash, itching, hives, swelling of the face, lips, tongue, or throat Bleeding--bloody or black, tar-like stools, vomiting blood or brown material that looks like coffee grounds, red or dark brown urine, small red or purple spots on skin, unusual bruising or bleeding Blood clot--pain, swelling, or warmth in the leg, shortness of breath, chest pain Heart attack--pain or tightness in the chest, shoulders, arms, or jaw, nausea, shortness of breath, cold or clammy skin, feeling faint or lightheaded Heart failure--shortness of breath, swelling of the ankles, feet, or  hands, sudden weight gain, unusual weakness or fatigue Increase in blood pressure Infection--fever, chills, cough, sore throat, wounds that don't heal, pain or trouble when passing urine, general feeling of discomfort or being unwell Infusion reactions--chest pain, shortness of breath or trouble breathing, feeling faint or lightheaded Kidney injury--decrease in the amount of urine, swelling of the ankles, hands, or feet Stomach pain that is severe, does not go away, or gets worse Stroke--sudden numbness or weakness of the face, arm, or leg, trouble speaking, confusion, trouble walking, loss of balance or  coordination, dizziness, severe headache, change in vision Sudden and severe headache, confusion, change in vision, seizures, which may be signs of posterior reversible encephalopathy syndrome (PRES) Side effects that usually do not require medical attention (report to your care team if they continue or are bothersome): Back pain Change in taste Diarrhea Dry skin Increased tears Nosebleed This list may not describe all possible side effects. Call your doctor for medical advice about side effects. You may report side effects to FDA at 1-800-FDA-1088. Where should I keep my medication? This medication is given in a hospital or clinic. It will not be stored at home. NOTE: This sheet is a summary. It may not cover all possible information. If you have questions about this medicine, talk to your doctor, pharmacist, or health care provider.  2024 Elsevier/Gold Standard (2021-09-06 00:00:00)

## 2023-05-20 NOTE — Progress Notes (Addendum)
 Pt unable to provide urine sample today. Results pending for 24 hr urine. Ok to treat without UP results and w SCr 2.01.  Jobe Mulder [place], [place], BCPS, BCOP 05/20/2023  1:46 PM

## 2023-05-29 ENCOUNTER — Other Ambulatory Visit (HOSPITAL_COMMUNITY): Payer: Self-pay | Admitting: Internal Medicine

## 2023-05-29 NOTE — Telephone Encounter (Signed)
Prescription refill request for Eliquis received. Indication:aflutter Last office visit:7/24 Scr:2.01  1/25 Age: 78 Weight:91.1  kg  Prescription refilled

## 2023-06-02 ENCOUNTER — Encounter: Payer: Self-pay | Admitting: Cardiology

## 2023-06-02 ENCOUNTER — Ambulatory Visit: Payer: Medicare Other | Attending: Cardiology | Admitting: Cardiology

## 2023-06-02 VITALS — BP 144/84 | HR 67 | Ht 76.0 in | Wt 215.0 lb

## 2023-06-02 DIAGNOSIS — I483 Typical atrial flutter: Secondary | ICD-10-CM | POA: Diagnosis not present

## 2023-06-02 DIAGNOSIS — I480 Paroxysmal atrial fibrillation: Secondary | ICD-10-CM | POA: Insufficient documentation

## 2023-06-02 DIAGNOSIS — D6869 Other thrombophilia: Secondary | ICD-10-CM | POA: Insufficient documentation

## 2023-06-02 DIAGNOSIS — I10 Essential (primary) hypertension: Secondary | ICD-10-CM | POA: Diagnosis not present

## 2023-06-02 NOTE — Progress Notes (Signed)
  Electrophysiology Office Note:   Date:  06/02/2023  ID:  Louis Endow., DOB Mar 14, 1946, MRN [id number]  Primary Cardiologist: None Primary Heart Failure: None Electrophysiologist: Damondre Pfeifle [name], MD      History of Present Illness:   Louis Kosta. is a 78 y.o. male with h/o colon cancer, aortic atherosclerosis, CKD stage IV, left bundle branch block, ascending aortic aneurysm, atrial flutter/fibrillation seen today for routine electrophysiology followup.   Since last being seen in our clinic the patient reports doing overall well.  He is continuing to get chemotherapy for colon cancer.  He is having some shortness of breath which she attributes to this.  He has not noted any further episodes of atrial fibrillation.  Happy with his control.  he denies chest pain, palpitations, dyspnea, PND, orthopnea, nausea, vomiting, dizziness, syncope, edema, weight gain, or early satiety.   Review of systems complete and found to be negative unless listed in HPI.   EP Information / Studies Reviewed:    EKG is ordered today. Personal review as below.  EKG Interpretation Date/Time:  Tuesday June 02 2023 10:57:53 EST Ventricular Rate:  67 PR Interval:  196 QRS Duration:  166 QT Interval:  444 QTC Calculation: 469 R Axis:   15  Text Interpretation: Sinus rhythm with Premature atrial complexes in a pattern of bigeminy Left bundle branch block Confirmed by Nate Common ([id number]) on 06/02/2023 11:10:29 AM     Risk Assessment/Calculations:    CHA2DS2-VASc Score = 4   This indicates a 4.8% annual risk of stroke. The patient's score is based upon: CHF History: 0 HTN History: 1 Diabetes History: 0 Stroke History: 0 Vascular Disease History: 1 Age Score: 2 Gender Score: 0            Physical Exam:   VS:  BP (!) 144/84 (BP Location: Left Arm, Patient Position: Sitting, Cuff Size: Large)   Pulse 67   Ht 6\' 4"  (1.93 m)   Wt 215 lb (97.5 kg)   SpO2 97%   BMI 26.17  kg/m    Wt Readings from Last 3 Encounters:  06/02/23 215 lb (97.5 kg)  05/13/23 200 lb 14.4 oz (91.1 kg)  04/30/23 202 lb 4.8 oz (91.8 kg)     GEN: Well nourished, well developed in no acute distress NECK: No JVD; No carotid bruits CARDIAC: Regular rate and rhythm, no murmurs, rubs, gallops RESPIRATORY:  Clear to auscultation without rales, wheezing or rhonchi  ABDOMEN: Soft, non-tender, non-distended EXTREMITIES:  No edema; No deformity   ASSESSMENT AND PLAN:    1.  Typical atrial flutter: Post ablation 09/09/2022.  No obvious recurrence.  No changes.  2.  Persistent atrial fibrillation: Has had multiple episodes of atrial fibrillation.  These have been associated with drinking alcohol.  Holding off on therapy for now.  3.  Secondary hypercoagulable state: Currently on Eliquis for atrial fibrillation  4.  Hypertension: Mildly elevated today.  Plan per primary physician.  Follow up with Afib Clinic in 12 months  Signed, Charlaine Utsey [name], MD

## 2023-06-03 ENCOUNTER — Inpatient Hospital Stay: Payer: Medicare Other

## 2023-06-03 ENCOUNTER — Encounter: Payer: Self-pay | Admitting: Nurse Practitioner

## 2023-06-03 ENCOUNTER — Other Ambulatory Visit: Payer: Self-pay

## 2023-06-03 ENCOUNTER — Inpatient Hospital Stay (HOSPITAL_BASED_OUTPATIENT_CLINIC_OR_DEPARTMENT_OTHER): Payer: Medicare Other | Admitting: Nurse Practitioner

## 2023-06-03 ENCOUNTER — Other Ambulatory Visit: Payer: Self-pay | Admitting: Oncology

## 2023-06-03 VITALS — BP 154/71 | HR 54

## 2023-06-03 VITALS — BP 146/68 | HR 76 | Temp 98.2°F | Resp 18 | Ht 76.0 in | Wt 216.7 lb

## 2023-06-03 DIAGNOSIS — C189 Malignant neoplasm of colon, unspecified: Secondary | ICD-10-CM

## 2023-06-03 DIAGNOSIS — Z5112 Encounter for antineoplastic immunotherapy: Secondary | ICD-10-CM | POA: Diagnosis not present

## 2023-06-03 DIAGNOSIS — I129 Hypertensive chronic kidney disease with stage 1 through stage 4 chronic kidney disease, or unspecified chronic kidney disease: Secondary | ICD-10-CM | POA: Diagnosis not present

## 2023-06-03 DIAGNOSIS — C187 Malignant neoplasm of sigmoid colon: Secondary | ICD-10-CM | POA: Diagnosis not present

## 2023-06-03 DIAGNOSIS — C778 Secondary and unspecified malignant neoplasm of lymph nodes of multiple regions: Secondary | ICD-10-CM | POA: Diagnosis not present

## 2023-06-03 DIAGNOSIS — N189 Chronic kidney disease, unspecified: Secondary | ICD-10-CM | POA: Diagnosis not present

## 2023-06-03 DIAGNOSIS — C7800 Secondary malignant neoplasm of unspecified lung: Secondary | ICD-10-CM | POA: Diagnosis not present

## 2023-06-03 LAB — CBC WITH DIFFERENTIAL (CANCER CENTER ONLY)
Abs Immature Granulocytes: 0.02 10*3/uL (ref 0.00–0.07)
Basophils Absolute: 0 10*3/uL (ref 0.0–0.1)
Basophils Relative: 0 %
Eosinophils Absolute: 0.2 10*3/uL (ref 0.0–0.5)
Eosinophils Relative: 4 %
HCT: 30.2 % — ABNORMAL LOW (ref 39.0–52.0)
Hemoglobin: 9.4 g/dL — ABNORMAL LOW (ref 13.0–17.0)
Immature Granulocytes: 0 %
Lymphocytes Relative: 25 %
Lymphs Abs: 1.1 10*3/uL (ref 0.7–4.0)
MCH: 28.7 pg (ref 26.0–34.0)
MCHC: 31.1 g/dL (ref 30.0–36.0)
MCV: 92.1 fL (ref 80.0–100.0)
Monocytes Absolute: 0.5 10*3/uL (ref 0.1–1.0)
Monocytes Relative: 10 %
Neutro Abs: 2.7 10*3/uL (ref 1.7–7.7)
Neutrophils Relative %: 61 %
Platelet Count: 156 10*3/uL (ref 150–400)
RBC: 3.28 MIL/uL — ABNORMAL LOW (ref 4.22–5.81)
RDW: 24.6 % — ABNORMAL HIGH (ref 11.5–15.5)
WBC Count: 4.5 10*3/uL (ref 4.0–10.5)
nRBC: 0 % (ref 0.0–0.2)

## 2023-06-03 LAB — CMP (CANCER CENTER ONLY)
ALT: 7 U/L (ref 0–44)
AST: 12 U/L — ABNORMAL LOW (ref 15–41)
Albumin: 3.2 g/dL — ABNORMAL LOW (ref 3.5–5.0)
Alkaline Phosphatase: 122 U/L (ref 38–126)
Anion gap: 3 — ABNORMAL LOW (ref 5–15)
BUN: 27 mg/dL — ABNORMAL HIGH (ref 8–23)
CO2: 24 mmol/L (ref 22–32)
Calcium: 8.5 mg/dL — ABNORMAL LOW (ref 8.9–10.3)
Chloride: 111 mmol/L (ref 98–111)
Creatinine: 1.82 mg/dL — ABNORMAL HIGH (ref 0.61–1.24)
GFR, Estimated: 38 mL/min — ABNORMAL LOW (ref >60)
Glucose, Bld: 93 mg/dL (ref 70–99)
Potassium: 4.4 mmol/L (ref 3.5–5.1)
Sodium: 138 mmol/L (ref 135–145)
Total Bilirubin: 0.3 mg/dL (ref 0.0–1.2)
Total Protein: 6.3 g/dL — ABNORMAL LOW (ref 6.5–8.1)

## 2023-06-03 LAB — MAGNESIUM: Magnesium: 1.9 mg/dL (ref 1.7–2.4)

## 2023-06-03 LAB — TOTAL PROTEIN, URINE DIPSTICK: Protein, ur: 30 mg/dL — AB

## 2023-06-03 MED ORDER — HEPARIN SOD (PORK) LOCK FLUSH 100 UNIT/ML IV SOLN
500.0000 [IU] | Freq: Once | INTRAVENOUS | Status: AC | PRN
  Administered 2023-06-03: 500 [IU]

## 2023-06-03 MED ORDER — SODIUM CHLORIDE 0.9 % IV SOLN: INTRAVENOUS | Status: DC

## 2023-06-03 MED ORDER — LONSURF 15-6.14 MG PO TABS
30.0000 mg | ORAL_TABLET | Freq: Two times a day (BID) | ORAL | 0 refills | Status: DC
  Filled 2023-06-03: qty 40, 28d supply, fill #0

## 2023-06-03 MED ORDER — SODIUM CHLORIDE 0.9% FLUSH
10.0000 mL | INTRAVENOUS | Status: DC | PRN
  Administered 2023-06-03: 10 mL

## 2023-06-03 MED ORDER — SODIUM CHLORIDE 0.9 % IV SOLN
5.0000 mg/kg | Freq: Once | INTRAVENOUS | Status: AC
  Administered 2023-06-03: 500 mg via INTRAVENOUS
  Filled 2023-06-03: qty 16

## 2023-06-03 NOTE — Progress Notes (Signed)
Specialty Pharmacy Ongoing Clinical Assessment Note  Louis [name]. is a 78 y.o. male who is being followed by the specialty pharmacy service for RxSp Oncology   Patient's specialty medication(s) reviewed today: Trifluridine-Tipiracil (Lonsurf)   Missed doses in the last 4 weeks: 0   Patient/Caregiver did not have any additional questions or concerns.   Therapeutic benefit summary: Unable to assess   Adverse events/side effects summary: No adverse events/side effects   Patient's therapy is appropriate to: Continue    Goals Addressed             This Visit's Progress    Stabilization of disease       Patient is on track. Patient will maintain adherence         Follow up:  3 months  Bobette Mo Specialty Pharmacist

## 2023-06-03 NOTE — Progress Notes (Signed)
[facility] OFFICE PROGRESS NOTE   Diagnosis: Colon cancer  INTERVAL HISTORY:   [name] returns as scheduled.  He began cycle 1 Lonsurf 05/19/2023.  He denies nausea.  No mouth sores.  No diarrhea.  No hand or foot pain or redness.  He notes itchy skin, no rash.  He denies bleeding.  No symptom of thrombosis.  He reports "hazy" vision for several years.  No eye pain or redness.  Objective:  Vital signs in last 24 hours:  Blood pressure (!) 146/68, pulse 76, temperature 98.2 F (36.8 C), temperature source Temporal, resp. rate 18, height 6\' 4"  (1.93 m), weight 216 lb 11.2 oz (98.3 kg), SpO2 100%.    HEENT: No thrush or ulcers. Lymphatics: 1 cm left axillary lymph node.  Approximate 6 cm firm left inguinal mass with area of nodularity/early ulceration. Resp: Lungs clear bilaterally. Cardio: Regular rate and rhythm. GI: No hepatosplenomegaly. Vascular: No leg edema. Skin: No rash. Portacath without erythema.  Lab Results:  Lab Results  Component Value Date   WBC 4.5 06/03/2023   HGB 9.4 (L) 06/03/2023   HCT 30.2 (L) 06/03/2023   MCV 92.1 06/03/2023   PLT 156 06/03/2023   NEUTROABS 2.7 06/03/2023    Imaging:  No results found.  Medications: I have reviewed the patient's current medications.  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), isolated mesenteric implant-resected, multiple tumor deposits Sigmoid/descending resection and creation of a descending colostomy 04/10/2016 MSI-stable, no loss of mismatch repair protein expression Foundation 1- BRAF V600E positive, MS-stable, intermediate tumor mutation burden, no RAS mutation CT abdomen/pelvis 04/02/2016-no evidence of distant metastatic disease Cycle 1 FOLFOX 05/21/2016 Cycle 2 FOLFOX 06/04/2016 Cycle 3 FOLFOX 06/18/2016 Cycle 4 FOLFOX 07/02/2016 (oxaliplatin held secondary to thrombocytopenia) Cycle 5 FOLFOX 07/16/2016 Cycle 6 FOLFOX 07/30/2016 Cycle 7 FOLFOX 08/13/2016  (oxaliplatin held and 5-FU dose reduced) Cycle 8 FOLFOX 09/03/2016 (oxaliplatin held secondary to neuropathy) Cycle 9 FOLFOX 09/17/2016 (oxaliplatin held secondary to neuropathy) Cycle 10 FOLFOX 10/02/2016 Cycle 11 FOLFOX 10/15/2016 (oxaliplatin eliminated from the regimen) Cycle 12 FOLFOX 10/30/2016 (oxaliplatin eliminated) CTs 05/12/2017-no evidence of recurrent disease, right inguinal hernia containing bladder Colonoscopy 07/21/2017, 3 polyps were removed from the descending and transverse colon, fragments of tubular and tubulovillous adenoma CTs 05/19/2018-no evidence of recurrent disease, status post hernia repair, right upper lobe pneumonia CTs 05/20/2019-resolution of right upper lobe pneumonia, no evidence of metastatic disease Colonoscopy 01/25/2020-multiple polyps removed-tubular adenomas, poor preparation, repeat colonoscopy recommended CTs neck, chest, and abdomen/pelvis 08/27/2020- new 2 centimeter left axillary node, new 1.9 cm left inguinal node chronic mildly prominent portacaval node Ultrasound biopsy of left inguinal node on 10/29/2020-metastatic adenocarcinoma consistent with colon adenocarcinoma PET 11/12/2020-enlarged hypermetabolic left inguinal and left axillary nodes, solitary focus of hypermetabolic activity in the right lower quadrant small bowel Cycle 1 FOLFOX 12/19/2020 Cycle 2 FOLFOX 01/02/2021, oxaliplatin dose reduced secondary to neutropenia and thrombocytopenia Cycle 3 FOLFOX 01/16/2021 Cycle 4 FOLFOX 01/30/2021 CTs 02/11/2021-no change in left inguinal and left axillary nodes, no evidence of disease progression, stable wall thickening involving a loop of distal ileum Cycle 1 FOLFIRI/Avastin 02/27/2021 Cycle 2 FOLFIRI/Avastin 03/13/2021 Cycle 3 FOLFIRI/Avastin 04/03/2021 Cycle 4 FOLFIRI 04/17/2021, Avastin held due to elevated urine protein 05/07/2021-24-hour urine protein elevated 1562 Cycle 5 FOLFIRI 05/08/2021, Avastin held due to elevated urine protein, irinotecan dose  reduced due to in general not feeling well after treatment CTs 05/17/2021-left axillary and left inguinal lymph nodes are smaller, no evidence of disease progression Cycle 6 FOLFIRI 05/22/2021, Avastin held  due to proteinuria 06/03/2021 24-hour urine with 1.9 g of protein Cycle 7 FOLFIRI 06/12/2021, Avastin held due to proteinuria Cycle 8 FOLFIRI 07/03/2021, Avastin held due to proteinuria Cycle 9 FOLFIRI 07/24/2021, Avastin held due to proteinuria Cycle 10 FOLFIRI 08/19/2021, Avastin held due to proteinuria CTs 09/02/2021-no change in the left axillary, left inguinal, and portacaval lymph nodes.  No mention of the distal small bowel thickening Maintenance 5-fluorouracil 09/18/2021 CT abdomen/pelvis 10/28/2021-enlarged left inguinal lymph node, stable; stomach and small bowel grossly unremarkable. Maintenance 5-fluorouracil continued Colonoscopy 12/10/2021-terminal ileum with an ulcerated partially obstructing medium-sized mass approximately 15 to 20 cm from the IC valve, invasive adenocarcinoma, colonic type, moderately differentiated (grade 2) PET scan 12/18/2021-progression of disease in the peritoneal space.  2 intensely hypermetabolic nodes in the left axilla, one node is new from the prior, the other is reduced in size and metabolic activity; decrease in size and metabolic activity of left inguinal adenopathy; intense metabolic activity associate with a loop of small bowel in the right lower quadrant, no interval change; new small hypermetabolic peritoneal implant along the ventral peritoneal surface just right of midline; larger new peritoneal implant in the deep right pelvis; small new implant in the right mid abdomen Encorafenib/Panitumumab 01/08/2022 CTs 03/17/2022-decrease size of mesenteric implant at the posterior bladder wall, small mesenteric peritoneal lesions identified on PET/CT or not evident.  Decrease size of left inguinal left axillary nodes, hypermetabolic activity noted at the anterior left  prostate on comparison August 2023 PET CTs 07/21/2022-decrease size of a left axillary lymph node and right pelvic implant.  Left inguinal lymph node measures smaller.  No new sites of disease identified.  Similar tiny pulmonary nodules. Encorafenib and Panitumumab continued, Panitumumab changed from a 2-week schedule to a 3-week schedule 07/23/2022 CT 10/30/2022-stable left axillary lymph node, no new or progressive metastatic disease in the chest; stable left inguinal lymphadenopathy; decrease in size of deep right peritoneal implant; no new metastatic disease in the abdomen/pelvis. encorafenib and Panitumumab continued Panitumumab dose reduced 11/25/2022 due to skin rash CTs 02/20/2023-increased left inguinal adenopathy, developing left external iliac and ileocolic mesenteric adenopathy, unchanged left axillary node, enlarging pulmonary nodules minimal enlargement of cul-de-sac nodule Encorafenib and panitumumab continued Panitumumab every 2 weeks beginning 04/15/2023, continue encorafenib Restaging CTs 05/03/2023-new low-density liver lesions.  Progression of pelvic and less so abdominal nodal metastasis.  Mild progression of pulmonary metastasis.  Left axillary adenopathy improved. Cycle 1 Lonsurf 05/19/2023, Avastin every 2 weeks  2.   Chronic renal insufficiency   3.   Hypertension   4.  Inflamed sebaceous cyst at the upper back-status post incision and drainage   5.  Thrombocytopenia secondary chemotherapy-oxaliplatin dose reduced beginning with cycle 3 FOLFOX, oxaliplatin held with cycle 4 and cycle 7 FOLFOX   6.  Oxaliplatin neuropathy-improved   7.  History of Mucositis secondary chemotherapy   8.  Symptoms of pneumonia January 2020-CT 05/19/2018 consistent with right upper lobe pneumonia, Levaquin prescribed 05/20/2018   9.  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  Anemia, progressive 10/29/2021 2 units packed red blood cells  10/31/2021 Ferritin low 10/29/2021-oral iron Ferritin low 01/22/2022   12.  Colonoscopy 12/10/2021-terminal ileum with an ulcerated partially obstructing medium-sized mass approximately 15 to 20 cm from the IC valve, invasive adenocarcinoma, colonic type, moderately differentiated (grade 2) 13.  Admission 09/02/2022 with atrial flutter and rapid ventricular response 14.  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 appears  stable.  He completed cycle 1 Lonsurf beginning 05/19/2023.  He tolerated well.  He is receiving Avastin every 2 weeks.  Plan to proceed with Avastin today as scheduled.  CBC and chemistry panel reviewed.  Labs adequate to proceed as above.  He will schedule an appointment with ophthalmology to evaluate the visual haziness.  He will return for follow-up and Avastin 06/22/2023.  We are available to see him sooner if needed.    Lonna Cobb ANP/GNP-BC   06/03/2023  1:56 PM

## 2023-06-03 NOTE — Patient Instructions (Signed)
[facility] - A DEPT OF [facility]  Discharge Instructions: Thank you for choosing [facility] to provide your oncology and hematology care.   If you have a lab appointment with the Cancer Center, please go directly to the Cancer Center and check in at the registration area.   Wear comfortable clothing and clothing appropriate for easy access to any Portacath or PICC line.   We strive to give you quality time with your provider. You may need to reschedule your appointment if you arrive late (15 or more minutes).  Arriving late affects you and other patients whose appointments are after yours.  Also, if you miss three or more appointments without notifying the office, you may be dismissed from the clinic at the provider's discretion.      For prescription refill requests, have your pharmacy contact our office and allow 72 hours for refills to be completed.    Today you received the following chemotherapy and/or immunotherapy agents bevacizumab   To help prevent nausea and vomiting after your treatment, we encourage you to take your nausea medication as directed.  BELOW ARE SYMPTOMS THAT SHOULD BE REPORTED IMMEDIATELY: *FEVER GREATER THAN 100.4 F (38 C) OR HIGHER *CHILLS OR SWEATING *NAUSEA AND VOMITING THAT IS NOT CONTROLLED WITH YOUR NAUSEA MEDICATION *UNUSUAL SHORTNESS OF BREATH *UNUSUAL BRUISING OR BLEEDING *URINARY PROBLEMS (pain or burning when urinating, or frequent urination) *BOWEL PROBLEMS (unusual diarrhea, constipation, pain near the anus) TENDERNESS IN MOUTH AND THROAT WITH OR WITHOUT PRESENCE OF ULCERS (sore throat, sores in mouth, or a toothache) UNUSUAL RASH, SWELLING OR PAIN  UNUSUAL VAGINAL DISCHARGE OR ITCHING   Items with * indicate a potential emergency and should be followed up as soon as possible or go to the Emergency Department if any problems should occur.  Please show the CHEMOTHERAPY ALERT CARD or IMMUNOTHERAPY  ALERT CARD at check-in to the Emergency Department and triage nurse.  Should you have questions after your visit or need to cancel or reschedule your appointment, please contact [facility]  Dept: [phone]  and follow the prompts.  Office hours are 8:00 a.m. to 4:30 p.m. Monday - Friday. Please note that voicemails left after 4:00 p.m. may not be returned until the following business day.  We are closed weekends and major holidays. You have access to a nurse at all times for urgent questions. Please call the main number to the clinic Dept: [phone] and follow the prompts.   For any non-urgent questions, you may also contact your provider using MyChart. We now offer e-Visits for anyone 37 and older to request care online for non-urgent symptoms. For details visit [email or web address].   Also download the MyChart app! Go to the app store, search "MyChart", open the app, select [place], and log in with your MyChart username and password.  Bevacizumab Injection What is this medication? BEVACIZUMAB (be va SIZ yoo mab) treats some types of cancer. It works by blocking a protein that causes cancer cells to grow and multiply. This helps to slow or stop the spread of cancer cells. It is a monoclonal antibody. This medicine may be used for other purposes; ask your health care provider or pharmacist if you have questions. COMMON BRAND NAME(S): Alymsys, Avastin, MVASI, Omer Jack What should I tell my care team before I take this medication? They need to know if you have any of these conditions: Blood clots Coughing  up blood Having or recent surgery Heart failure High blood pressure History of a connection between 2 or more body parts that do not usually connect (fistula) History of a tear in your stomach or intestines Protein in your urine An unusual or allergic reaction to bevacizumab, other medications, foods, dyes, or  preservatives Pregnant or trying to get pregnant Breast-feeding How should I use this medication? This medication is injected into a vein. It is given by your care team in a hospital or clinic setting. Talk to your care team the use of this medication in children. Special care may be needed. Overdosage: If you think you have taken too much of this medicine contact a poison control center or emergency room at once. NOTE: This medicine is only for you. Do not share this medicine with others. What if I miss a dose? Keep appointments for follow-up doses. It is important not to miss your dose. Call your care team if you are unable to keep an appointment. What may interact with this medication? Interactions are not expected. This list may not describe all possible interactions. Give your health care provider a list of all the medicines, herbs, non-prescription drugs, or dietary supplements you use. Also tell them if you smoke, drink alcohol, or use illegal drugs. Some items may interact with your medicine. What should I watch for while using this medication? Your condition will be monitored carefully while you are receiving this medication. You may need blood work while taking this medication. This medication may make you feel generally unwell. This is not uncommon as chemotherapy can affect healthy cells as well as cancer cells. Report any side effects. Continue your course of treatment even though you feel ill unless your care team tells you to stop. This medication may increase your risk to bruise or bleed. Call your care team if you notice any unusual bleeding. Before having surgery, talk to your care team to make sure it is ok. This medication can increase the risk of poor healing of your surgical site or wound. You will need to stop this medication for 28 days before surgery. After surgery, wait at least 28 days before restarting this medication. Make sure the surgical site or wound is healed enough  before restarting this medication. Talk to your care team if questions. Talk to your care team if you may be pregnant. Serious birth defects can occur if you take this medication during pregnancy and for 6 months after the last dose. Contraception is recommended while taking this medication and for 6 months after the last dose. Your care team can help you find the option that works for you. Do not breastfeed while taking this medication and for 6 months after the last dose. This medication can cause infertility. Talk to your care team if you are concerned about your fertility. What side effects may I notice from receiving this medication? Side effects that you should report to your care team as soon as possible: Allergic reactions--skin rash, itching, hives, swelling of the face, lips, tongue, or throat Bleeding--bloody or black, tar-like stools, vomiting blood or brown material that looks like coffee grounds, red or dark brown urine, small red or purple spots on skin, unusual bruising or bleeding Blood clot--pain, swelling, or warmth in the leg, shortness of breath, chest pain Heart attack--pain or tightness in the chest, shoulders, arms, or jaw, nausea, shortness of breath, cold or clammy skin, feeling faint or lightheaded Heart failure--shortness of breath, swelling of the ankles, feet, or  hands, sudden weight gain, unusual weakness or fatigue Increase in blood pressure Infection--fever, chills, cough, sore throat, wounds that don't heal, pain or trouble when passing urine, general feeling of discomfort or being unwell Infusion reactions--chest pain, shortness of breath or trouble breathing, feeling faint or lightheaded Kidney injury--decrease in the amount of urine, swelling of the ankles, hands, or feet Stomach pain that is severe, does not go away, or gets worse Stroke--sudden numbness or weakness of the face, arm, or leg, trouble speaking, confusion, trouble walking, loss of balance or  coordination, dizziness, severe headache, change in vision Sudden and severe headache, confusion, change in vision, seizures, which may be signs of posterior reversible encephalopathy syndrome (PRES) Side effects that usually do not require medical attention (report to your care team if they continue or are bothersome): Back pain Change in taste Diarrhea Dry skin Increased tears Nosebleed This list may not describe all possible side effects. Call your doctor for medical advice about side effects. You may report side effects to FDA at 1-800-FDA-1088. Where should I keep my medication? This medication is given in a hospital or clinic. It will not be stored at home. NOTE: This sheet is a summary. It may not cover all possible information. If you have questions about this medicine, talk to your doctor, pharmacist, or health care provider.  2024 Elsevier/Gold Standard (2021-09-06 00:00:00)

## 2023-06-03 NOTE — Progress Notes (Signed)
Specialty Pharmacy Refill Coordination Note  Sherrine Maples. is a 78 y.o. male contacted today regarding refills of specialty medication(s) Trifluridine-Tipiracil [name]   Patient requested Delivery   Delivery date: 06/16/23   Verified address: [address] [place], [place] [address]   Medication will be filled on 06/15/23. Pending Refill Request.   Patient has first follow-up appointment on 06/22/23.   Next cycle starts on 06/22/23.

## 2023-06-03 NOTE — Progress Notes (Signed)
Patient seen by Lonna Cobb NP today  Vitals are within treatment parameters:Yes   Labs are within treatment parameters: Yes Creatinine 1.82 and protein 30 Its okay to treat  Treatment plan has been signed: Yes   Per physician team, Patient is ready for treatment and there are NO modifications to the treatment plan.

## 2023-06-04 ENCOUNTER — Other Ambulatory Visit: Payer: Self-pay

## 2023-06-06 ENCOUNTER — Other Ambulatory Visit: Payer: Self-pay

## 2023-06-12 ENCOUNTER — Other Ambulatory Visit: Payer: Self-pay | Admitting: Oncology

## 2023-06-12 DIAGNOSIS — D649 Anemia, unspecified: Secondary | ICD-10-CM

## 2023-06-12 DIAGNOSIS — C189 Malignant neoplasm of colon, unspecified: Secondary | ICD-10-CM

## 2023-06-15 ENCOUNTER — Other Ambulatory Visit: Payer: Self-pay

## 2023-06-17 ENCOUNTER — Other Ambulatory Visit: Payer: Medicare Other

## 2023-06-17 ENCOUNTER — Ambulatory Visit: Payer: Medicare Other | Admitting: Oncology

## 2023-06-17 ENCOUNTER — Ambulatory Visit: Payer: Medicare Other

## 2023-06-18 ENCOUNTER — Other Ambulatory Visit: Payer: Medicare Other

## 2023-06-18 ENCOUNTER — Ambulatory Visit: Payer: Medicare Other

## 2023-06-18 ENCOUNTER — Ambulatory Visit: Payer: Medicare Other | Admitting: Oncology

## 2023-06-19 ENCOUNTER — Other Ambulatory Visit: Payer: Self-pay | Admitting: Oncology

## 2023-06-22 ENCOUNTER — Inpatient Hospital Stay (HOSPITAL_BASED_OUTPATIENT_CLINIC_OR_DEPARTMENT_OTHER): Payer: Medicare Other | Admitting: Nurse Practitioner

## 2023-06-22 ENCOUNTER — Encounter: Payer: Self-pay | Admitting: Nurse Practitioner

## 2023-06-22 ENCOUNTER — Inpatient Hospital Stay: Payer: Medicare Other

## 2023-06-22 ENCOUNTER — Ambulatory Visit: Payer: Medicare Other

## 2023-06-22 ENCOUNTER — Inpatient Hospital Stay: Payer: Medicare Other | Attending: Oncology

## 2023-06-22 ENCOUNTER — Ambulatory Visit: Payer: Medicare Other | Admitting: Nurse Practitioner

## 2023-06-22 ENCOUNTER — Other Ambulatory Visit: Payer: Medicare Other

## 2023-06-22 VITALS — BP 145/82 | HR 61 | Temp 98.1°F | Resp 16 | Wt 226.4 lb

## 2023-06-22 VITALS — BP 162/79 | HR 57

## 2023-06-22 DIAGNOSIS — C189 Malignant neoplasm of colon, unspecified: Secondary | ICD-10-CM | POA: Diagnosis not present

## 2023-06-22 DIAGNOSIS — D649 Anemia, unspecified: Secondary | ICD-10-CM | POA: Insufficient documentation

## 2023-06-22 DIAGNOSIS — C187 Malignant neoplasm of sigmoid colon: Secondary | ICD-10-CM | POA: Diagnosis not present

## 2023-06-22 DIAGNOSIS — Z5112 Encounter for antineoplastic immunotherapy: Secondary | ICD-10-CM | POA: Diagnosis not present

## 2023-06-22 DIAGNOSIS — D6959 Other secondary thrombocytopenia: Secondary | ICD-10-CM | POA: Diagnosis not present

## 2023-06-22 DIAGNOSIS — G620 Drug-induced polyneuropathy: Secondary | ICD-10-CM | POA: Insufficient documentation

## 2023-06-22 DIAGNOSIS — N189 Chronic kidney disease, unspecified: Secondary | ICD-10-CM | POA: Insufficient documentation

## 2023-06-22 DIAGNOSIS — I129 Hypertensive chronic kidney disease with stage 1 through stage 4 chronic kidney disease, or unspecified chronic kidney disease: Secondary | ICD-10-CM | POA: Insufficient documentation

## 2023-06-22 LAB — CBC WITH DIFFERENTIAL (CANCER CENTER ONLY)
Abs Immature Granulocytes: 0.01 10*3/uL (ref 0.00–0.07)
Basophils Absolute: 0 10*3/uL (ref 0.0–0.1)
Basophils Relative: 1 %
Eosinophils Absolute: 0.1 10*3/uL (ref 0.0–0.5)
Eosinophils Relative: 3 %
HCT: 35.1 % — ABNORMAL LOW (ref 39.0–52.0)
Hemoglobin: 11 g/dL — ABNORMAL LOW (ref 13.0–17.0)
Immature Granulocytes: 0 %
Lymphocytes Relative: 24 %
Lymphs Abs: 1.1 10*3/uL (ref 0.7–4.0)
MCH: 29.9 pg (ref 26.0–34.0)
MCHC: 31.3 g/dL (ref 30.0–36.0)
MCV: 95.4 fL (ref 80.0–100.0)
Monocytes Absolute: 0.6 10*3/uL (ref 0.1–1.0)
Monocytes Relative: 13 %
Neutro Abs: 2.8 10*3/uL (ref 1.7–7.7)
Neutrophils Relative %: 59 %
Platelet Count: 134 10*3/uL — ABNORMAL LOW (ref 150–400)
RBC: 3.68 MIL/uL — ABNORMAL LOW (ref 4.22–5.81)
RDW: 21.7 % — ABNORMAL HIGH (ref 11.5–15.5)
WBC Count: 4.7 10*3/uL (ref 4.0–10.5)
nRBC: 0 % (ref 0.0–0.2)

## 2023-06-22 LAB — CMP (CANCER CENTER ONLY)
ALT: 7 U/L (ref 0–44)
AST: 11 U/L — ABNORMAL LOW (ref 15–41)
Albumin: 3.5 g/dL (ref 3.5–5.0)
Alkaline Phosphatase: 113 U/L (ref 38–126)
Anion gap: 6 (ref 5–15)
BUN: 31 mg/dL — ABNORMAL HIGH (ref 8–23)
CO2: 22 mmol/L (ref 22–32)
Calcium: 7.8 mg/dL — ABNORMAL LOW (ref 8.9–10.3)
Chloride: 110 mmol/L (ref 98–111)
Creatinine: 2.37 mg/dL — ABNORMAL HIGH (ref 0.61–1.24)
GFR, Estimated: 28 mL/min — ABNORMAL LOW (ref >60)
Glucose, Bld: 104 mg/dL — ABNORMAL HIGH (ref 70–99)
Potassium: 4.6 mmol/L (ref 3.5–5.1)
Sodium: 138 mmol/L (ref 135–145)
Total Bilirubin: 0.3 mg/dL (ref 0.0–1.2)
Total Protein: 6.5 g/dL (ref 6.5–8.1)

## 2023-06-22 LAB — TOTAL PROTEIN, URINE DIPSTICK: Protein, ur: 100 mg/dL — AB

## 2023-06-22 LAB — MAGNESIUM: Magnesium: 2.4 mg/dL (ref 1.7–2.4)

## 2023-06-22 MED ORDER — SODIUM CHLORIDE 0.9 % IV SOLN
5.0000 mg/kg | Freq: Once | INTRAVENOUS | Status: AC
  Administered 2023-06-22: 500 mg via INTRAVENOUS
  Filled 2023-06-22: qty 16

## 2023-06-22 MED ORDER — LIDOCAINE-PRILOCAINE 2.5-2.5 % EX CREA: 1.0000 | TOPICAL_CREAM | CUTANEOUS | 2 refills | Status: DC | PRN

## 2023-06-22 MED ORDER — SODIUM CHLORIDE 0.9% FLUSH
10.0000 mL | INTRAVENOUS | Status: DC | PRN
  Administered 2023-06-22: 10 mL

## 2023-06-22 MED ORDER — HEPARIN SOD (PORK) LOCK FLUSH 100 UNIT/ML IV SOLN
500.0000 [IU] | Freq: Once | INTRAVENOUS | Status: AC | PRN
  Administered 2023-06-22: 500 [IU]

## 2023-06-22 MED ORDER — SODIUM CHLORIDE 0.9 % IV SOLN: INTRAVENOUS | Status: DC

## 2023-06-22 NOTE — Progress Notes (Signed)
[facility] OFFICE PROGRESS NOTE   Diagnosis: Colon cancer  INTERVAL HISTORY:   Louis [name] returns as scheduled.  He completed a treatment with Avastin 06/03/2023.  He denies nausea/vomiting.  No mouth sores.  No diarrhea.  No hand or foot pain or redness.  No rash.  Some areas of pruritus.  No bleeding.  No symptom of thrombosis.  Objective:  Vital signs in last 24 hours:  Blood pressure (!) 145/82, pulse 61, temperature 98.1 F (36.7 C), temperature source Temporal, resp. rate 16, weight 226 lb 6.4 oz (102.7 kg), SpO2 100%.    HEENT: No thrush or ulcers. Lymphatics: Approximate 1 cm left axillary lymph node, question lymph node just inferior.  Approximate 6 cm firm left inguinal mass with area of nodularity with early ulceration. Resp: Lungs clear bilaterally. Cardio: Regular rate and rhythm. GI: Abdomen soft and nontender.  No hepatosplenomegaly. Vascular: Trace lower leg edema bilaterally. Skin: No rash. Port-A-Cath without erythema.  Lab Results:  Lab Results  Component Value Date   WBC 4.7 06/22/2023   HGB 11.0 (L) 06/22/2023   HCT 35.1 (L) 06/22/2023   MCV 95.4 06/22/2023   PLT 134 (L) 06/22/2023   NEUTROABS 2.8 06/22/2023    Imaging:  No results found.  Medications: I have reviewed the patient's current medications.  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), isolated mesenteric implant-resected, multiple tumor deposits Sigmoid/descending resection and creation of a descending colostomy 04/10/2016 MSI-stable, no loss of mismatch repair protein expression Foundation 1- BRAF V600E positive, MS-stable, intermediate tumor mutation burden, no RAS mutation CT abdomen/pelvis 04/02/2016-no evidence of distant metastatic disease Cycle 1 FOLFOX 05/21/2016 Cycle 2 FOLFOX 06/04/2016 Cycle 3 FOLFOX 06/18/2016 Cycle 4 FOLFOX 07/02/2016 (oxaliplatin held secondary to thrombocytopenia) Cycle 5 FOLFOX 07/16/2016 Cycle 6 FOLFOX  07/30/2016 Cycle 7 FOLFOX 08/13/2016 (oxaliplatin held and 5-FU dose reduced) Cycle 8 FOLFOX 09/03/2016 (oxaliplatin held secondary to neuropathy) Cycle 9 FOLFOX 09/17/2016 (oxaliplatin held secondary to neuropathy) Cycle 10 FOLFOX 10/02/2016 Cycle 11 FOLFOX 10/15/2016 (oxaliplatin eliminated from the regimen) Cycle 12 FOLFOX 10/30/2016 (oxaliplatin eliminated) CTs 05/12/2017-no evidence of recurrent disease, right inguinal hernia containing bladder Colonoscopy 07/21/2017, 3 polyps were removed from the descending and transverse colon, fragments of tubular and tubulovillous adenoma CTs 05/19/2018-no evidence of recurrent disease, status post hernia repair, right upper lobe pneumonia CTs 05/20/2019-resolution of right upper lobe pneumonia, no evidence of metastatic disease Colonoscopy 01/25/2020-multiple polyps removed-tubular adenomas, poor preparation, repeat colonoscopy recommended CTs neck, chest, and abdomen/pelvis 08/27/2020- new 2 centimeter left axillary node, new 1.9 cm left inguinal node chronic mildly prominent portacaval node Ultrasound biopsy of left inguinal node on 10/29/2020-metastatic adenocarcinoma consistent with colon adenocarcinoma PET 11/12/2020-enlarged hypermetabolic left inguinal and left axillary nodes, solitary focus of hypermetabolic activity in the right lower quadrant small bowel Cycle 1 FOLFOX 12/19/2020 Cycle 2 FOLFOX 01/02/2021, oxaliplatin dose reduced secondary to neutropenia and thrombocytopenia Cycle 3 FOLFOX 01/16/2021 Cycle 4 FOLFOX 01/30/2021 CTs 02/11/2021-no change in left inguinal and left axillary nodes, no evidence of disease progression, stable wall thickening involving a loop of distal ileum Cycle 1 FOLFIRI/Avastin 02/27/2021 Cycle 2 FOLFIRI/Avastin 03/13/2021 Cycle 3 FOLFIRI/Avastin 04/03/2021 Cycle 4 FOLFIRI 04/17/2021, Avastin held due to elevated urine protein 05/07/2021-24-hour urine protein elevated 1562 Cycle 5 FOLFIRI 05/08/2021, Avastin held due to  elevated urine protein, irinotecan dose reduced due to in general not feeling well after treatment CTs 05/17/2021-left axillary and left inguinal lymph nodes are smaller, no evidence of disease progression Cycle 6 FOLFIRI 05/22/2021, Avastin held due to  proteinuria 06/03/2021 24-hour urine with 1.9 g of protein Cycle 7 FOLFIRI 06/12/2021, Avastin held due to proteinuria Cycle 8 FOLFIRI 07/03/2021, Avastin held due to proteinuria Cycle 9 FOLFIRI 07/24/2021, Avastin held due to proteinuria Cycle 10 FOLFIRI 08/19/2021, Avastin held due to proteinuria CTs 09/02/2021-no change in the left axillary, left inguinal, and portacaval lymph nodes.  No mention of the distal small bowel thickening Maintenance 5-fluorouracil 09/18/2021 CT abdomen/pelvis 10/28/2021-enlarged left inguinal lymph node, stable; stomach and small bowel grossly unremarkable. Maintenance 5-fluorouracil continued Colonoscopy 12/10/2021-terminal ileum with an ulcerated partially obstructing medium-sized mass approximately 15 to 20 cm from the IC valve, invasive adenocarcinoma, colonic type, moderately differentiated (grade 2) PET scan 12/18/2021-progression of disease in the peritoneal space.  2 intensely hypermetabolic nodes in the left axilla, one node is new from the prior, the other is reduced in size and metabolic activity; decrease in size and metabolic activity of left inguinal adenopathy; intense metabolic activity associate with a loop of small bowel in the right lower quadrant, no interval change; new small hypermetabolic peritoneal implant along the ventral peritoneal surface just right of midline; larger new peritoneal implant in the deep right pelvis; small new implant in the right mid abdomen Encorafenib/Panitumumab 01/08/2022 CTs 03/17/2022-decrease size of mesenteric implant at the posterior bladder wall, small mesenteric peritoneal lesions identified on PET/CT or not evident.  Decrease size of left inguinal left axillary nodes, hypermetabolic  activity noted at the anterior left prostate on comparison August 2023 PET CTs 07/21/2022-decrease size of a left axillary lymph node and right pelvic implant.  Left inguinal lymph node measures smaller.  No new sites of disease identified.  Similar tiny pulmonary nodules. Encorafenib and Panitumumab continued, Panitumumab changed from a 2-week schedule to a 3-week schedule 07/23/2022 CT 10/30/2022-stable left axillary lymph node, no new or progressive metastatic disease in the chest; stable left inguinal lymphadenopathy; decrease in size of deep right peritoneal implant; no new metastatic disease in the abdomen/pelvis. encorafenib and Panitumumab continued Panitumumab dose reduced 11/25/2022 due to skin rash CTs 02/20/2023-increased left inguinal adenopathy, developing left external iliac and ileocolic mesenteric adenopathy, unchanged left axillary node, enlarging pulmonary nodules minimal enlargement of cul-de-sac nodule Encorafenib and panitumumab continued Panitumumab every 2 weeks beginning 04/15/2023, continue encorafenib Restaging CTs 05/03/2023-new low-density liver lesions.  Progression of pelvic and less so abdominal nodal metastasis.  Mild progression of pulmonary metastasis.  Left axillary adenopathy improved. Cycle 1 Lonsurf 05/19/2023, Avastin every 2 weeks  Cycle 2 Lonsurf 06/23/2023, Avastin every 2 weeks 2.   Chronic renal insufficiency   3.   Hypertension   4.  Inflamed sebaceous cyst at the upper back-status post incision and drainage   5.  Thrombocytopenia secondary chemotherapy-oxaliplatin dose reduced beginning with cycle 3 FOLFOX, oxaliplatin held with cycle 4 and cycle 7 FOLFOX   6.  Oxaliplatin neuropathy-improved   7.  History of Mucositis secondary chemotherapy   8.  Symptoms of pneumonia January 2020-CT 05/19/2018 consistent with right upper lobe pneumonia, Levaquin prescribed 05/20/2018   9.  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  Anemia, progressive 10/29/2021 2 units packed red blood cells 10/31/2021 Ferritin low 10/29/2021-oral iron Ferritin low 01/22/2022   12.  Colonoscopy 12/10/2021-terminal ileum with an ulcerated partially obstructing medium-sized mass approximately 15 to 20 cm from the IC valve, invasive adenocarcinoma, colonic type, moderately differentiated (grade 2) 13.  Admission 09/02/2022 with atrial flutter and rapid ventricular response 14.  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  He has completed 1 cycle of Lonsurf.  He continues bevacizumab every 2 weeks.  He will begin cycle 2 Lonsurf 06/23/2023.  Proceed with bevacizumab today as scheduled.  CBC and chemistry panel reviewed.  Labs adequate to proceed as above.  Urine protein is higher at 100.  He will submit a 24-hour urine for total protein prior to the next office visit.  He will return for follow-up as scheduled 07/06/2023.  We are available to see him sooner if needed.    Lonna Cobb ANP/GNP-BC   06/22/2023  2:10 PM

## 2023-06-22 NOTE — Patient Instructions (Signed)
 [facility] - A DEPT OF [facility]   Discharge Instructions: Thank you for choosing [facility] to provide your oncology and hematology care.   If you have a lab appointment with the Cancer Center, please go directly to the Cancer Center and check in at the registration area.   Wear comfortable clothing and clothing appropriate for easy access to any Portacath or PICC line.   We strive to give you quality time with your provider. You may need to reschedule your appointment if you arrive late (15 or more minutes).  Arriving late affects you and other patients whose appointments are after yours.  Also, if you miss three or more appointments without notifying the office, you may be dismissed from the clinic at the provider's discretion.      For prescription refill requests, have your pharmacy contact our office and allow 72 hours for refills to be completed.    Today you received the following chemotherapy and/or immunotherapy agents AVASTIN       To help prevent nausea and vomiting after your treatment, we encourage you to take your nausea medication as directed.  BELOW ARE SYMPTOMS THAT SHOULD BE REPORTED IMMEDIATELY: *FEVER GREATER THAN 100.4 F (38 C) OR HIGHER *CHILLS OR SWEATING *NAUSEA AND VOMITING THAT IS NOT CONTROLLED WITH YOUR NAUSEA MEDICATION *UNUSUAL SHORTNESS OF BREATH *UNUSUAL BRUISING OR BLEEDING *URINARY PROBLEMS (pain or burning when urinating, or frequent urination) *BOWEL PROBLEMS (unusual diarrhea, constipation, pain near the anus) TENDERNESS IN MOUTH AND THROAT WITH OR WITHOUT PRESENCE OF ULCERS (sore throat, sores in mouth, or a toothache) UNUSUAL RASH, SWELLING OR PAIN  UNUSUAL VAGINAL DISCHARGE OR ITCHING   Items with * indicate a potential emergency and should be followed up as soon as possible or go to the Emergency Department if any problems should occur.  Please show the CHEMOTHERAPY ALERT CARD or IMMUNOTHERAPY  ALERT CARD at check-in to the Emergency Department and triage nurse.  Should you have questions after your visit or need to cancel or reschedule your appointment, please contact [facility] - A [facility]  Dept: [phone]  and follow the prompts.  Office hours are 8:00 a.m. to 4:30 p.m. Monday - Friday. Please note that voicemails left after 4:00 p.m. may not be returned until the following business day.  We are closed weekends and major holidays. You have access to a nurse at all times for urgent questions. Please call the main number to the clinic Dept: [phone] and follow the prompts.   For any non-urgent questions, you may also contact your provider using MyChart. We now offer e-Visits for anyone 79 and older to request care online for non-urgent symptoms. For details visit [email or web address].   Also download the MyChart app! Go to the app store, search "MyChart", open the app, select [place], and log in with your MyChart username and password.  Bevacizumab Injection What is this medication? BEVACIZUMAB (be va SIZ yoo mab) treats some types of cancer. It works by blocking a protein that causes cancer cells to grow and multiply. This helps to slow or stop the spread of cancer cells. It is a monoclonal antibody. This medicine may be used for other purposes; ask your health care provider or pharmacist if you have questions. COMMON BRAND NAME(S): Alymsys, Avastin, MVASI, Rosaland Lao What should I tell my care team before I take this medication? They need to know if you have any  of these conditions: Blood clots Coughing up blood Having or recent surgery Heart failure High blood pressure History of a connection between 2 or more body parts that do not usually connect (fistula) History of a tear in your stomach or intestines Protein in your urine An unusual or allergic reaction to bevacizumab, other medications, foods, dyes, or  preservatives Pregnant or trying to get pregnant Breast-feeding How should I use this medication? This medication is injected into a vein. It is given by your care team in a hospital or clinic setting. Talk to your care team the use of this medication in children. Special care may be needed. Overdosage: If you think you have taken too much of this medicine contact a poison control center or emergency room at once. NOTE: This medicine is only for you. Do not share this medicine with others. What if I miss a dose? Keep appointments for follow-up doses. It is important not to miss your dose. Call your care team if you are unable to keep an appointment. What may interact with this medication? Interactions are not expected. This list may not describe all possible interactions. Give your health care provider a list of all the medicines, herbs, non-prescription drugs, or dietary supplements you use. Also tell them if you smoke, drink alcohol, or use illegal drugs. Some items may interact with your medicine. What should I watch for while using this medication? Your condition will be monitored carefully while you are receiving this medication. You may need blood work while taking this medication. This medication may make you feel generally unwell. This is not uncommon as chemotherapy can affect healthy cells as well as cancer cells. Report any side effects. Continue your course of treatment even though you feel ill unless your care team tells you to stop. This medication may increase your risk to bruise or bleed. Call your care team if you notice any unusual bleeding. Before having surgery, talk to your care team to make sure it is ok. This medication can increase the risk of poor healing of your surgical site or wound. You will need to stop this medication for 28 days before surgery. After surgery, wait at least 28 days before restarting this medication. Make sure the surgical site or wound is healed enough  before restarting this medication. Talk to your care team if questions. Talk to your care team if you may be pregnant. Serious birth defects can occur if you take this medication during pregnancy and for 6 months after the last dose. Contraception is recommended while taking this medication and for 6 months after the last dose. Your care team can help you find the option that works for you. Do not breastfeed while taking this medication and for 6 months after the last dose. This medication can cause infertility. Talk to your care team if you are concerned about your fertility. What side effects may I notice from receiving this medication? Side effects that you should report to your care team as soon as possible: Allergic reactions--skin rash, itching, hives, swelling of the face, lips, tongue, or throat Bleeding--bloody or black, tar-like stools, vomiting blood or brown material that looks like coffee grounds, red or dark brown urine, small red or purple spots on skin, unusual bruising or bleeding Blood clot--pain, swelling, or warmth in the leg, shortness of breath, chest pain Heart attack--pain or tightness in the chest, shoulders, arms, or jaw, nausea, shortness of breath, cold or clammy skin, feeling faint or lightheaded Heart failure--shortness of breath,  swelling of the ankles, feet, or hands, sudden weight gain, unusual weakness or fatigue Increase in blood pressure Infection--fever, chills, cough, sore throat, wounds that don't heal, pain or trouble when passing urine, general feeling of discomfort or being unwell Infusion reactions--chest pain, shortness of breath or trouble breathing, feeling faint or lightheaded Kidney injury--decrease in the amount of urine, swelling of the ankles, hands, or feet Stomach pain that is severe, does not go away, or gets worse Stroke--sudden numbness or weakness of the face, arm, or leg, trouble speaking, confusion, trouble walking, loss of balance or  coordination, dizziness, severe headache, change in vision Sudden and severe headache, confusion, change in vision, seizures, which may be signs of posterior reversible encephalopathy syndrome (PRES) Side effects that usually do not require medical attention (report to your care team if they continue or are bothersome): Back pain Change in taste Diarrhea Dry skin Increased tears Nosebleed This list may not describe all possible side effects. Call your doctor for medical advice about side effects. You may report side effects to FDA at 1-800-FDA-1088. Where should I keep my medication? This medication is given in a hospital or clinic. It will not be stored at home. NOTE: This sheet is a summary. It may not cover all possible information. If you have questions about this medicine, talk to your doctor, pharmacist, or health care provider.  2024 Elsevier/Gold Standard (2021-09-06 00:00:00)

## 2023-06-22 NOTE — Progress Notes (Signed)
Patient seen by Lonna Cobb NP today  Vitals are within treatment parameters:Yes   Labs are within treatment parameters: No (Please specify and give further instructions.) Creatinine 2.37 and urine protein 100.   Treatment plan has been signed: Yes   Per physician team, Patient is ready for treatment and there are NO modifications to the treatment plan.

## 2023-06-23 ENCOUNTER — Other Ambulatory Visit: Payer: Self-pay

## 2023-06-23 DIAGNOSIS — C187 Malignant neoplasm of sigmoid colon: Secondary | ICD-10-CM | POA: Diagnosis not present

## 2023-06-23 DIAGNOSIS — N179 Acute kidney failure, unspecified: Secondary | ICD-10-CM | POA: Diagnosis not present

## 2023-06-23 DIAGNOSIS — E785 Hyperlipidemia, unspecified: Secondary | ICD-10-CM | POA: Diagnosis not present

## 2023-06-23 DIAGNOSIS — N2581 Secondary hyperparathyroidism of renal origin: Secondary | ICD-10-CM | POA: Diagnosis not present

## 2023-06-23 DIAGNOSIS — I129 Hypertensive chronic kidney disease with stage 1 through stage 4 chronic kidney disease, or unspecified chronic kidney disease: Secondary | ICD-10-CM | POA: Diagnosis not present

## 2023-06-23 DIAGNOSIS — I10 Essential (primary) hypertension: Secondary | ICD-10-CM | POA: Diagnosis not present

## 2023-06-23 DIAGNOSIS — N1832 Chronic kidney disease, stage 3b: Secondary | ICD-10-CM | POA: Diagnosis not present

## 2023-06-23 DIAGNOSIS — E669 Obesity, unspecified: Secondary | ICD-10-CM | POA: Diagnosis not present

## 2023-06-23 DIAGNOSIS — N189 Chronic kidney disease, unspecified: Secondary | ICD-10-CM | POA: Diagnosis not present

## 2023-06-23 DIAGNOSIS — D631 Anemia in chronic kidney disease: Secondary | ICD-10-CM | POA: Diagnosis not present

## 2023-06-24 DIAGNOSIS — H40053 Ocular hypertension, bilateral: Secondary | ICD-10-CM | POA: Diagnosis not present

## 2023-06-24 DIAGNOSIS — H25813 Combined forms of age-related cataract, bilateral: Secondary | ICD-10-CM | POA: Diagnosis not present

## 2023-06-29 ENCOUNTER — Other Ambulatory Visit: Payer: Self-pay

## 2023-07-01 ENCOUNTER — Other Ambulatory Visit: Payer: Self-pay | Admitting: *Deleted

## 2023-07-01 DIAGNOSIS — C189 Malignant neoplasm of colon, unspecified: Secondary | ICD-10-CM

## 2023-07-03 DIAGNOSIS — I129 Hypertensive chronic kidney disease with stage 1 through stage 4 chronic kidney disease, or unspecified chronic kidney disease: Secondary | ICD-10-CM | POA: Diagnosis not present

## 2023-07-03 DIAGNOSIS — Z5112 Encounter for antineoplastic immunotherapy: Secondary | ICD-10-CM | POA: Diagnosis not present

## 2023-07-03 DIAGNOSIS — C187 Malignant neoplasm of sigmoid colon: Secondary | ICD-10-CM | POA: Diagnosis not present

## 2023-07-03 DIAGNOSIS — D6959 Other secondary thrombocytopenia: Secondary | ICD-10-CM | POA: Diagnosis not present

## 2023-07-03 DIAGNOSIS — G620 Drug-induced polyneuropathy: Secondary | ICD-10-CM | POA: Diagnosis not present

## 2023-07-03 DIAGNOSIS — N189 Chronic kidney disease, unspecified: Secondary | ICD-10-CM | POA: Diagnosis not present

## 2023-07-05 ENCOUNTER — Other Ambulatory Visit: Payer: Self-pay | Admitting: Oncology

## 2023-07-06 ENCOUNTER — Other Ambulatory Visit: Payer: Self-pay | Admitting: *Deleted

## 2023-07-06 ENCOUNTER — Inpatient Hospital Stay: Payer: Medicare Other

## 2023-07-06 ENCOUNTER — Inpatient Hospital Stay: Payer: Medicare Other | Attending: Oncology

## 2023-07-06 ENCOUNTER — Inpatient Hospital Stay (HOSPITAL_BASED_OUTPATIENT_CLINIC_OR_DEPARTMENT_OTHER): Payer: Medicare Other | Admitting: Oncology

## 2023-07-06 ENCOUNTER — Other Ambulatory Visit: Payer: Self-pay

## 2023-07-06 DIAGNOSIS — R809 Proteinuria, unspecified: Secondary | ICD-10-CM | POA: Insufficient documentation

## 2023-07-06 DIAGNOSIS — C774 Secondary and unspecified malignant neoplasm of inguinal and lower limb lymph nodes: Secondary | ICD-10-CM | POA: Insufficient documentation

## 2023-07-06 DIAGNOSIS — C187 Malignant neoplasm of sigmoid colon: Secondary | ICD-10-CM | POA: Diagnosis not present

## 2023-07-06 DIAGNOSIS — C189 Malignant neoplasm of colon, unspecified: Secondary | ICD-10-CM

## 2023-07-06 DIAGNOSIS — D6959 Other secondary thrombocytopenia: Secondary | ICD-10-CM | POA: Diagnosis not present

## 2023-07-06 DIAGNOSIS — D649 Anemia, unspecified: Secondary | ICD-10-CM | POA: Insufficient documentation

## 2023-07-06 DIAGNOSIS — Z452 Encounter for adjustment and management of vascular access device: Secondary | ICD-10-CM | POA: Insufficient documentation

## 2023-07-06 DIAGNOSIS — G620 Drug-induced polyneuropathy: Secondary | ICD-10-CM | POA: Diagnosis not present

## 2023-07-06 DIAGNOSIS — I129 Hypertensive chronic kidney disease with stage 1 through stage 4 chronic kidney disease, or unspecified chronic kidney disease: Secondary | ICD-10-CM | POA: Insufficient documentation

## 2023-07-06 DIAGNOSIS — Z5112 Encounter for antineoplastic immunotherapy: Secondary | ICD-10-CM | POA: Diagnosis not present

## 2023-07-06 DIAGNOSIS — C7800 Secondary malignant neoplasm of unspecified lung: Secondary | ICD-10-CM | POA: Diagnosis not present

## 2023-07-06 DIAGNOSIS — N189 Chronic kidney disease, unspecified: Secondary | ICD-10-CM | POA: Diagnosis not present

## 2023-07-06 LAB — PROTEIN, URINE, 24 HOUR
Collection Interval-UPROT: 24 h
Protein, 24H Urine: 7080 mg/d — ABNORMAL HIGH (ref 50–100)
Protein, Urine: 295 mg/dL
Urine Total Volume-UPROT: 2400 mL

## 2023-07-06 LAB — CBC WITH DIFFERENTIAL (CANCER CENTER ONLY)
Abs Immature Granulocytes: 0.02 10*3/uL (ref 0.00–0.07)
Basophils Absolute: 0 10*3/uL (ref 0.0–0.1)
Basophils Relative: 0 %
Eosinophils Absolute: 0.2 10*3/uL (ref 0.0–0.5)
Eosinophils Relative: 3 %
HCT: 34.8 % — ABNORMAL LOW (ref 39.0–52.0)
Hemoglobin: 11.1 g/dL — ABNORMAL LOW (ref 13.0–17.0)
Immature Granulocytes: 0 %
Lymphocytes Relative: 21 %
Lymphs Abs: 1 10*3/uL (ref 0.7–4.0)
MCH: 30.9 pg (ref 26.0–34.0)
MCHC: 31.9 g/dL (ref 30.0–36.0)
MCV: 96.9 fL (ref 80.0–100.0)
Monocytes Absolute: 0.5 10*3/uL (ref 0.1–1.0)
Monocytes Relative: 10 %
Neutro Abs: 3.1 10*3/uL (ref 1.7–7.7)
Neutrophils Relative %: 66 %
Platelet Count: 122 10*3/uL — ABNORMAL LOW (ref 150–400)
RBC: 3.59 MIL/uL — ABNORMAL LOW (ref 4.22–5.81)
RDW: 20.6 % — ABNORMAL HIGH (ref 11.5–15.5)
WBC Count: 4.8 10*3/uL (ref 4.0–10.5)
nRBC: 0 % (ref 0.0–0.2)

## 2023-07-06 LAB — CMP (CANCER CENTER ONLY)
ALT: 7 U/L (ref 0–44)
AST: 10 U/L — ABNORMAL LOW (ref 15–41)
Albumin: 3.5 g/dL (ref 3.5–5.0)
Alkaline Phosphatase: 104 U/L (ref 38–126)
Anion gap: 6 (ref 5–15)
BUN: 33 mg/dL — ABNORMAL HIGH (ref 8–23)
CO2: 20 mmol/L — ABNORMAL LOW (ref 22–32)
Calcium: 8.4 mg/dL — ABNORMAL LOW (ref 8.9–10.3)
Chloride: 112 mmol/L — ABNORMAL HIGH (ref 98–111)
Creatinine: 2.53 mg/dL — ABNORMAL HIGH (ref 0.61–1.24)
GFR, Estimated: 26 mL/min — ABNORMAL LOW (ref >60)
Glucose, Bld: 92 mg/dL (ref 70–99)
Potassium: 4.9 mmol/L (ref 3.5–5.1)
Sodium: 138 mmol/L (ref 135–145)
Total Bilirubin: 0.3 mg/dL (ref 0.0–1.2)
Total Protein: 6.3 g/dL — ABNORMAL LOW (ref 6.5–8.1)

## 2023-07-06 LAB — MAGNESIUM: Magnesium: 2.4 mg/dL (ref 1.7–2.4)

## 2023-07-06 MED ORDER — LONSURF 15-6.14 MG PO TABS
30.0000 mg | ORAL_TABLET | Freq: Two times a day (BID) | ORAL | 0 refills | Status: DC
  Filled 2023-07-06: qty 40, 10d supply, fill #0
  Filled 2023-07-13: qty 40, 28d supply, fill #0

## 2023-07-06 MED ORDER — SODIUM CHLORIDE 0.9 % IV SOLN: INTRAVENOUS | Status: DC

## 2023-07-06 MED ORDER — SODIUM CHLORIDE 0.9 % IV SOLN
5.0000 mg/kg | Freq: Once | INTRAVENOUS | Status: AC
  Administered 2023-07-06: 500 mg via INTRAVENOUS
  Filled 2023-07-06: qty 16

## 2023-07-06 NOTE — Progress Notes (Signed)
 Patient seen by Dr. Thornton Papas today  Vitals are within treatment parameters:Yes   Labs are within treatment parameters: No (Please specify and give further instructions.)  Creatinine 2.53--OK to proceed. No need for urine protein today.  Mg+ pending. May start treatment while waiting.  Treatment plan has been signed: Yes   Per physician team, Patient is ready for treatment and there are NO modifications to the treatment plan.

## 2023-07-06 NOTE — Progress Notes (Signed)
 [facility] OFFICE PROGRESS NOTE   Diagnosis: Colon cancer  INTERVAL HISTORY:   [name] returns as scheduled.  He began another cycle of Lonsurf 06/23/2023.  He continues every 2-week bevacizumab.  Mild nosebleeding.  No other bleeding.  He completed the course of Lonsurf on 07/04/2023.  No palpable change in the left groin mass.  Objective:  Vital signs in last 24 hours:  Blood pressure (!) 152/72, pulse 66, temperature 98.2 F (36.8 C), temperature source Temporal, resp. rate 18, height 6\' 4"  (1.93 m), weight 224 lb 8 oz (101.8 kg), SpO2 100%.    HEENT: No thrush or ulcers Lymphatics: No cervical, supraclavicular, right axillary, right inguinal nodes.  Separate 1-1.5 cm left axillary nodes, greater than 5 cm firm nodal mass in the left groin with a 1.5 cm superficial fungating lesion Resp: Lungs clear bilaterally Cardio: Regular rate and rhythm GI: No hepatosplenomegaly Vascular: Trace lower leg edema bilaterally  Skin: Fading rash over the face and trunk  Portacath/PICC-without erythema  Lab Results:  Lab Results  Component Value Date   WBC 4.8 07/06/2023   HGB 11.1 (L) 07/06/2023   HCT 34.8 (L) 07/06/2023   MCV 96.9 07/06/2023   PLT 122 (L) 07/06/2023   NEUTROABS 3.1 07/06/2023    CMP  Lab Results  Component Value Date   NA 138 07/06/2023   K 4.9 07/06/2023   CL 112 (H) 07/06/2023   CO2 20 (L) 07/06/2023   GLUCOSE 92 07/06/2023   BUN 33 (H) 07/06/2023   CREATININE 2.53 (H) 07/06/2023   CALCIUM 8.4 (L) 07/06/2023   PROT 6.3 (L) 07/06/2023   ALBUMIN 3.5 07/06/2023   AST 10 (L) 07/06/2023   ALT 7 07/06/2023   ALKPHOS 104 07/06/2023   BILITOT 0.3 07/06/2023   GFRNONAA 26 (L) 07/06/2023   GFRAA 28 (L) 09/20/2019    Lab Results  Component Value Date   CEA1 2.61 01/02/2021   CEA 4.13 05/20/2023    Medications: I have reviewed the patient's current medications.   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), isolated  mesenteric implant-resected, multiple tumor deposits Sigmoid/descending resection and creation of a descending colostomy 04/10/2016 MSI-stable, no loss of mismatch repair protein expression Foundation 1- BRAF V600E positive, MS-stable, intermediate tumor mutation burden, no RAS mutation CT abdomen/pelvis 04/02/2016-no evidence of distant metastatic disease Cycle 1 FOLFOX 05/21/2016 Cycle 2 FOLFOX 06/04/2016 Cycle 3 FOLFOX 06/18/2016 Cycle 4 FOLFOX 07/02/2016 (oxaliplatin held secondary to thrombocytopenia) Cycle 5 FOLFOX 07/16/2016 Cycle 6 FOLFOX 07/30/2016 Cycle 7 FOLFOX 08/13/2016 (oxaliplatin held and 5-FU dose reduced) Cycle 8 FOLFOX 09/03/2016 (oxaliplatin held secondary to neuropathy) Cycle 9 FOLFOX 09/17/2016 (oxaliplatin held secondary to neuropathy) Cycle 10 FOLFOX 10/02/2016 Cycle 11 FOLFOX 10/15/2016 (oxaliplatin eliminated from the regimen) Cycle 12 FOLFOX 10/30/2016 (oxaliplatin eliminated) CTs 05/12/2017-no evidence of recurrent disease, right inguinal hernia containing bladder Colonoscopy 07/21/2017, 3 polyps were removed from the descending and transverse colon, fragments of tubular and tubulovillous adenoma CTs 05/19/2018-no evidence of recurrent disease, status post hernia repair, right upper lobe pneumonia CTs 05/20/2019-resolution of right upper lobe pneumonia, no evidence of metastatic disease Colonoscopy 01/25/2020-multiple polyps removed-tubular adenomas, poor preparation, repeat colonoscopy recommended CTs neck, chest, and abdomen/pelvis 08/27/2020- new 2 centimeter left axillary node, new 1.9 cm left inguinal node chronic mildly prominent portacaval node Ultrasound biopsy of left inguinal node on 10/29/2020-metastatic adenocarcinoma consistent with colon adenocarcinoma PET 11/12/2020-enlarged hypermetabolic left inguinal and left axillary nodes, solitary focus of hypermetabolic activity in the right lower quadrant small bowel Cycle  1 FOLFOX 12/19/2020 Cycle 2 FOLFOX  01/02/2021, oxaliplatin dose reduced secondary to neutropenia and thrombocytopenia Cycle 3 FOLFOX 01/16/2021 Cycle 4 FOLFOX 01/30/2021 CTs 02/11/2021-no change in left inguinal and left axillary nodes, no evidence of disease progression, stable wall thickening involving a loop of distal ileum Cycle 1 FOLFIRI/Avastin 02/27/2021 Cycle 2 FOLFIRI/Avastin 03/13/2021 Cycle 3 FOLFIRI/Avastin 04/03/2021 Cycle 4 FOLFIRI 04/17/2021, Avastin held due to elevated urine protein 05/07/2021-24-hour urine protein elevated 1562 Cycle 5 FOLFIRI 05/08/2021, Avastin held due to elevated urine protein, irinotecan dose reduced due to in general not feeling well after treatment CTs 05/17/2021-left axillary and left inguinal lymph nodes are smaller, no evidence of disease progression Cycle 6 FOLFIRI 05/22/2021, Avastin held due to proteinuria 06/03/2021 24-hour urine with 1.9 g of protein Cycle 7 FOLFIRI 06/12/2021, Avastin held due to proteinuria Cycle 8 FOLFIRI 07/03/2021, Avastin held due to proteinuria Cycle 9 FOLFIRI 07/24/2021, Avastin held due to proteinuria Cycle 10 FOLFIRI 08/19/2021, Avastin held due to proteinuria CTs 09/02/2021-no change in the left axillary, left inguinal, and portacaval lymph nodes.  No mention of the distal small bowel thickening Maintenance 5-fluorouracil 09/18/2021 CT abdomen/pelvis 10/28/2021-enlarged left inguinal lymph node, stable; stomach and small bowel grossly unremarkable. Maintenance 5-fluorouracil continued Colonoscopy 12/10/2021-terminal ileum with an ulcerated partially obstructing medium-sized mass approximately 15 to 20 cm from the IC valve, invasive adenocarcinoma, colonic type, moderately differentiated (grade 2) PET scan 12/18/2021-progression of disease in the peritoneal space.  2 intensely hypermetabolic nodes in the left axilla, one node is new from the prior, the other is reduced in size and metabolic activity; decrease in size and metabolic activity of left inguinal adenopathy; intense  metabolic activity associate with a loop of small bowel in the right lower quadrant, no interval change; new small hypermetabolic peritoneal implant along the ventral peritoneal surface just right of midline; larger new peritoneal implant in the deep right pelvis; small new implant in the right mid abdomen Encorafenib/Panitumumab 01/08/2022 CTs 03/17/2022-decrease size of mesenteric implant at the posterior bladder wall, small mesenteric peritoneal lesions identified on PET/CT or not evident.  Decrease size of left inguinal left axillary nodes, hypermetabolic activity noted at the anterior left prostate on comparison August 2023 PET CTs 07/21/2022-decrease size of a left axillary lymph node and right pelvic implant.  Left inguinal lymph node measures smaller.  No new sites of disease identified.  Similar tiny pulmonary nodules. Encorafenib and Panitumumab continued, Panitumumab changed from a 2-week schedule to a 3-week schedule 07/23/2022 CT 10/30/2022-stable left axillary lymph node, no new or progressive metastatic disease in the chest; stable left inguinal lymphadenopathy; decrease in size of deep right peritoneal implant; no new metastatic disease in the abdomen/pelvis. encorafenib and Panitumumab continued Panitumumab dose reduced 11/25/2022 due to skin rash CTs 02/20/2023-increased left inguinal adenopathy, developing left external iliac and ileocolic mesenteric adenopathy, unchanged left axillary node, enlarging pulmonary nodules minimal enlargement of cul-de-sac nodule Encorafenib and panitumumab continued Panitumumab every 2 weeks beginning 04/15/2023, continue encorafenib Restaging CTs 05/03/2023-new low-density liver lesions.  Progression of pelvic and less so abdominal nodal metastasis.  Mild progression of pulmonary metastasis.  Left axillary adenopathy improved. Cycle 1 Lonsurf 05/19/2023, Avastin every 2 weeks  Cycle 2 Lonsurf 06/23/2023, Avastin every 2 weeks 2.   Chronic renal insufficiency    3.   Hypertension   4.  Inflamed sebaceous cyst at the upper back-status post incision and drainage   5.  Thrombocytopenia secondary chemotherapy-oxaliplatin dose reduced beginning with cycle 3 FOLFOX, oxaliplatin held with cycle 4 and cycle 7 FOLFOX  6.  Oxaliplatin neuropathy-improved   7.  History of Mucositis secondary chemotherapy   8.  Symptoms of pneumonia January 2020-CT 05/19/2018 consistent with right upper lobe pneumonia, Levaquin prescribed 05/20/2018   9.  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  Anemia, progressive 10/29/2021 2 units packed red blood cells 10/31/2021 Ferritin low 10/29/2021-oral iron Ferritin low 01/22/2022   12.  Colonoscopy 12/10/2021-terminal ileum with an ulcerated partially obstructing medium-sized mass approximately 15 to 20 cm from the IC valve, invasive adenocarcinoma, colonic type, moderately differentiated (grade 2) 13.  Admission 09/02/2022 with atrial flutter and rapid ventricular response 14.  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 has completed 2 cycles of Lonsurf.  He continues every 2-week bevacizumab.  His overall clinical status is stable.  There is a persistent firm mass with a superficial fungating component at the left groin.  He will complete another treatment with bevacizumab today.  He will return for an office visit and cycle 3 Lonsurf/bevacizumab on 07/21/2023.  He plans to have cataract surgery.  He will alert his ophthalmologist that he is taking apixaban and being treated with bevacizumab.  Thornton Papas, MD  07/06/2023  11:48 AM

## 2023-07-06 NOTE — Patient Instructions (Signed)
 [facility] - A DEPT OF [facility]   Discharge Instructions: Thank you for choosing [facility] to provide your oncology and hematology care.   If you have a lab appointment with the Cancer Center, please go directly to the Cancer Center and check in at the registration area.   Wear comfortable clothing and clothing appropriate for easy access to any Portacath or PICC line.   We strive to give you quality time with your provider. You may need to reschedule your appointment if you arrive late (15 or more minutes).  Arriving late affects you and other patients whose appointments are after yours.  Also, if you miss three or more appointments without notifying the office, you may be dismissed from the clinic at the provider's discretion.      For prescription refill requests, have your pharmacy contact our office and allow 72 hours for refills to be completed.    Today you received the following chemotherapy and/or immunotherapy agents AVASTIN       To help prevent nausea and vomiting after your treatment, we encourage you to take your nausea medication as directed.  BELOW ARE SYMPTOMS THAT SHOULD BE REPORTED IMMEDIATELY: *FEVER GREATER THAN 100.4 F (38 C) OR HIGHER *CHILLS OR SWEATING *NAUSEA AND VOMITING THAT IS NOT CONTROLLED WITH YOUR NAUSEA MEDICATION *UNUSUAL SHORTNESS OF BREATH *UNUSUAL BRUISING OR BLEEDING *URINARY PROBLEMS (pain or burning when urinating, or frequent urination) *BOWEL PROBLEMS (unusual diarrhea, constipation, pain near the anus) TENDERNESS IN MOUTH AND THROAT WITH OR WITHOUT PRESENCE OF ULCERS (sore throat, sores in mouth, or a toothache) UNUSUAL RASH, SWELLING OR PAIN  UNUSUAL VAGINAL DISCHARGE OR ITCHING   Items with * indicate a potential emergency and should be followed up as soon as possible or go to the Emergency Department if any problems should occur.  Please show the CHEMOTHERAPY ALERT CARD or IMMUNOTHERAPY  ALERT CARD at check-in to the Emergency Department and triage nurse.  Should you have questions after your visit or need to cancel or reschedule your appointment, please contact [facility] - A [facility]  Dept: [phone]  and follow the prompts.  Office hours are 8:00 a.m. to 4:30 p.m. Monday - Friday. Please note that voicemails left after 4:00 p.m. may not be returned until the following business day.  We are closed weekends and major holidays. You have access to a nurse at all times for urgent questions. Please call the main number to the clinic Dept: [phone] and follow the prompts.   For any non-urgent questions, you may also contact your provider using MyChart. We now offer e-Visits for anyone 79 and older to request care online for non-urgent symptoms. For details visit [email or web address].   Also download the MyChart app! Go to the app store, search "MyChart", open the app, select [place], and log in with your MyChart username and password.  Bevacizumab Injection What is this medication? BEVACIZUMAB (be va SIZ yoo mab) treats some types of cancer. It works by blocking a protein that causes cancer cells to grow and multiply. This helps to slow or stop the spread of cancer cells. It is a monoclonal antibody. This medicine may be used for other purposes; ask your health care provider or pharmacist if you have questions. COMMON BRAND NAME(S): Alymsys, Avastin, MVASI, Rosaland Lao What should I tell my care team before I take this medication? They need to know if you have any  of these conditions: Blood clots Coughing up blood Having or recent surgery Heart failure High blood pressure History of a connection between 2 or more body parts that do not usually connect (fistula) History of a tear in your stomach or intestines Protein in your urine An unusual or allergic reaction to bevacizumab, other medications, foods, dyes, or  preservatives Pregnant or trying to get pregnant Breast-feeding How should I use this medication? This medication is injected into a vein. It is given by your care team in a hospital or clinic setting. Talk to your care team the use of this medication in children. Special care may be needed. Overdosage: If you think you have taken too much of this medicine contact a poison control center or emergency room at once. NOTE: This medicine is only for you. Do not share this medicine with others. What if I miss a dose? Keep appointments for follow-up doses. It is important not to miss your dose. Call your care team if you are unable to keep an appointment. What may interact with this medication? Interactions are not expected. This list may not describe all possible interactions. Give your health care provider a list of all the medicines, herbs, non-prescription drugs, or dietary supplements you use. Also tell them if you smoke, drink alcohol, or use illegal drugs. Some items may interact with your medicine. What should I watch for while using this medication? Your condition will be monitored carefully while you are receiving this medication. You may need blood work while taking this medication. This medication may make you feel generally unwell. This is not uncommon as chemotherapy can affect healthy cells as well as cancer cells. Report any side effects. Continue your course of treatment even though you feel ill unless your care team tells you to stop. This medication may increase your risk to bruise or bleed. Call your care team if you notice any unusual bleeding. Before having surgery, talk to your care team to make sure it is ok. This medication can increase the risk of poor healing of your surgical site or wound. You will need to stop this medication for 28 days before surgery. After surgery, wait at least 28 days before restarting this medication. Make sure the surgical site or wound is healed enough  before restarting this medication. Talk to your care team if questions. Talk to your care team if you may be pregnant. Serious birth defects can occur if you take this medication during pregnancy and for 6 months after the last dose. Contraception is recommended while taking this medication and for 6 months after the last dose. Your care team can help you find the option that works for you. Do not breastfeed while taking this medication and for 6 months after the last dose. This medication can cause infertility. Talk to your care team if you are concerned about your fertility. What side effects may I notice from receiving this medication? Side effects that you should report to your care team as soon as possible: Allergic reactions--skin rash, itching, hives, swelling of the face, lips, tongue, or throat Bleeding--bloody or black, tar-like stools, vomiting blood or brown material that looks like coffee grounds, red or dark brown urine, small red or purple spots on skin, unusual bruising or bleeding Blood clot--pain, swelling, or warmth in the leg, shortness of breath, chest pain Heart attack--pain or tightness in the chest, shoulders, arms, or jaw, nausea, shortness of breath, cold or clammy skin, feeling faint or lightheaded Heart failure--shortness of breath,  swelling of the ankles, feet, or hands, sudden weight gain, unusual weakness or fatigue Increase in blood pressure Infection--fever, chills, cough, sore throat, wounds that don't heal, pain or trouble when passing urine, general feeling of discomfort or being unwell Infusion reactions--chest pain, shortness of breath or trouble breathing, feeling faint or lightheaded Kidney injury--decrease in the amount of urine, swelling of the ankles, hands, or feet Stomach pain that is severe, does not go away, or gets worse Stroke--sudden numbness or weakness of the face, arm, or leg, trouble speaking, confusion, trouble walking, loss of balance or  coordination, dizziness, severe headache, change in vision Sudden and severe headache, confusion, change in vision, seizures, which may be signs of posterior reversible encephalopathy syndrome (PRES) Side effects that usually do not require medical attention (report to your care team if they continue or are bothersome): Back pain Change in taste Diarrhea Dry skin Increased tears Nosebleed This list may not describe all possible side effects. Call your doctor for medical advice about side effects. You may report side effects to FDA at 1-800-FDA-1088. Where should I keep my medication? This medication is given in a hospital or clinic. It will not be stored at home. NOTE: This sheet is a summary. It may not cover all possible information. If you have questions about this medicine, talk to your doctor, pharmacist, or health care provider.  2024 Elsevier/Gold Standard (2021-09-06 00:00:00)

## 2023-07-07 ENCOUNTER — Other Ambulatory Visit: Payer: Self-pay

## 2023-07-08 ENCOUNTER — Telehealth: Payer: Self-pay

## 2023-07-08 NOTE — Telephone Encounter (Signed)
-----   Message from Lonna Cobb sent at 07/08/2023  3:53 PM EST ----- Please let him know the 24-hour urine showed an increase in total protein.  We will need to place Avastin on hold.  Follow-up as scheduled.

## 2023-07-08 NOTE — Telephone Encounter (Signed)
 Patient gave verbal understanding and had no further questions or concerns

## 2023-07-09 ENCOUNTER — Other Ambulatory Visit: Payer: Self-pay

## 2023-07-10 DIAGNOSIS — H40053 Ocular hypertension, bilateral: Secondary | ICD-10-CM | POA: Diagnosis not present

## 2023-07-12 ENCOUNTER — Other Ambulatory Visit: Payer: Self-pay | Admitting: *Deleted

## 2023-07-13 ENCOUNTER — Other Ambulatory Visit: Payer: Self-pay

## 2023-07-13 ENCOUNTER — Other Ambulatory Visit (HOSPITAL_COMMUNITY): Payer: Self-pay

## 2023-07-13 NOTE — Progress Notes (Signed)
 Specialty Pharmacy Refill Coordination Note  Louis [name]. is a 78 y.o. male contacted today regarding refills of specialty medication(s) Trifluridine-Tipiracil [name]   Patient requested Delivery   Delivery date: 07/15/23   Verified address: [address] [place],  [place] [address]   Medication will be filled on 07/14/23.

## 2023-07-15 DIAGNOSIS — H35371 Puckering of macula, right eye: Secondary | ICD-10-CM | POA: Diagnosis not present

## 2023-07-15 DIAGNOSIS — H401134 Primary open-angle glaucoma, bilateral, indeterminate stage: Secondary | ICD-10-CM | POA: Diagnosis not present

## 2023-07-15 DIAGNOSIS — H25813 Combined forms of age-related cataract, bilateral: Secondary | ICD-10-CM | POA: Diagnosis not present

## 2023-07-16 ENCOUNTER — Encounter: Payer: Self-pay | Admitting: Oncology

## 2023-07-16 NOTE — Progress Notes (Signed)
 Received request from [facility] for surgical clearance. MD signed form with note: Anesthesia per PCP OK for surgery from oncology standpoint. Patient treated with Avastin. Copy of note to HIM<

## 2023-07-21 ENCOUNTER — Inpatient Hospital Stay: Payer: Medicare Other

## 2023-07-21 ENCOUNTER — Encounter: Payer: Self-pay | Admitting: Nurse Practitioner

## 2023-07-21 ENCOUNTER — Inpatient Hospital Stay (HOSPITAL_BASED_OUTPATIENT_CLINIC_OR_DEPARTMENT_OTHER): Payer: Medicare Other | Admitting: Nurse Practitioner

## 2023-07-21 VITALS — BP 137/66 | HR 56 | Temp 98.1°F | Resp 18 | Wt 222.1 lb

## 2023-07-21 DIAGNOSIS — N189 Chronic kidney disease, unspecified: Secondary | ICD-10-CM | POA: Diagnosis not present

## 2023-07-21 DIAGNOSIS — C187 Malignant neoplasm of sigmoid colon: Secondary | ICD-10-CM | POA: Diagnosis not present

## 2023-07-21 DIAGNOSIS — Z5112 Encounter for antineoplastic immunotherapy: Secondary | ICD-10-CM | POA: Diagnosis not present

## 2023-07-21 DIAGNOSIS — C189 Malignant neoplasm of colon, unspecified: Secondary | ICD-10-CM

## 2023-07-21 DIAGNOSIS — D6959 Other secondary thrombocytopenia: Secondary | ICD-10-CM | POA: Diagnosis not present

## 2023-07-21 DIAGNOSIS — C7800 Secondary malignant neoplasm of unspecified lung: Secondary | ICD-10-CM | POA: Diagnosis not present

## 2023-07-21 DIAGNOSIS — C774 Secondary and unspecified malignant neoplasm of inguinal and lower limb lymph nodes: Secondary | ICD-10-CM | POA: Diagnosis not present

## 2023-07-21 DIAGNOSIS — Z95828 Presence of other vascular implants and grafts: Secondary | ICD-10-CM

## 2023-07-21 LAB — CBC WITH DIFFERENTIAL (CANCER CENTER ONLY)
Abs Immature Granulocytes: 0.01 10*3/uL (ref 0.00–0.07)
Basophils Absolute: 0 10*3/uL (ref 0.0–0.1)
Basophils Relative: 1 %
Eosinophils Absolute: 0.1 10*3/uL (ref 0.0–0.5)
Eosinophils Relative: 3 %
HCT: 35.8 % — ABNORMAL LOW (ref 39.0–52.0)
Hemoglobin: 11.5 g/dL — ABNORMAL LOW (ref 13.0–17.0)
Immature Granulocytes: 0 %
Lymphocytes Relative: 29 %
Lymphs Abs: 1 10*3/uL (ref 0.7–4.0)
MCH: 31.3 pg (ref 26.0–34.0)
MCHC: 32.1 g/dL (ref 30.0–36.0)
MCV: 97.3 fL (ref 80.0–100.0)
Monocytes Absolute: 0.6 10*3/uL (ref 0.1–1.0)
Monocytes Relative: 17 %
Neutro Abs: 1.8 10*3/uL (ref 1.7–7.7)
Neutrophils Relative %: 50 %
Platelet Count: 118 10*3/uL — ABNORMAL LOW (ref 150–400)
RBC: 3.68 MIL/uL — ABNORMAL LOW (ref 4.22–5.81)
RDW: 19.9 % — ABNORMAL HIGH (ref 11.5–15.5)
WBC Count: 3.5 10*3/uL — ABNORMAL LOW (ref 4.0–10.5)
nRBC: 0 % (ref 0.0–0.2)

## 2023-07-21 LAB — MAGNESIUM: Magnesium: 2.3 mg/dL (ref 1.7–2.4)

## 2023-07-21 LAB — CMP (CANCER CENTER ONLY)
ALT: 8 U/L (ref 0–44)
AST: 11 U/L — ABNORMAL LOW (ref 15–41)
Albumin: 3.5 g/dL (ref 3.5–5.0)
Alkaline Phosphatase: 107 U/L (ref 38–126)
Anion gap: 5 (ref 5–15)
BUN: 39 mg/dL — ABNORMAL HIGH (ref 8–23)
CO2: 20 mmol/L — ABNORMAL LOW (ref 22–32)
Calcium: 8 mg/dL — ABNORMAL LOW (ref 8.9–10.3)
Chloride: 113 mmol/L — ABNORMAL HIGH (ref 98–111)
Creatinine: 2.54 mg/dL — ABNORMAL HIGH (ref 0.61–1.24)
GFR, Estimated: 25 mL/min — ABNORMAL LOW (ref >60)
Glucose, Bld: 129 mg/dL — ABNORMAL HIGH (ref 70–99)
Potassium: 4.4 mmol/L (ref 3.5–5.1)
Sodium: 138 mmol/L (ref 135–145)
Total Bilirubin: 0.3 mg/dL (ref 0.0–1.2)
Total Protein: 6.3 g/dL — ABNORMAL LOW (ref 6.5–8.1)

## 2023-07-21 LAB — TOTAL PROTEIN, URINE DIPSTICK: Protein, ur: 100 mg/dL — AB

## 2023-07-21 MED ORDER — SODIUM CHLORIDE 0.9% FLUSH
10.0000 mL | INTRAVENOUS | Status: DC | PRN
Start: 2023-07-21 — End: 2023-07-21
  Administered 2023-07-21: 10 mL via INTRAVENOUS

## 2023-07-21 MED ORDER — HEPARIN SOD (PORK) LOCK FLUSH 100 UNIT/ML IV SOLN
500.0000 [IU] | Freq: Once | INTRAVENOUS | Status: AC
Start: 2023-07-21 — End: 2023-07-21
  Administered 2023-07-21: 500 [IU] via INTRAVENOUS

## 2023-07-21 NOTE — Progress Notes (Signed)
 [facility] OFFICE PROGRESS NOTE   Diagnosis: Colon cancer  INTERVAL HISTORY:   Louis [name] returns as scheduled.  He completed cycle 2 Lonsurf beginning 06/23/2023.  He completed another treatment with bevacizumab 07/06/2023.  He denies nausea/vomiting.  No mouth sores.  No significant diarrhea.  When he does have loose stools he is able to manage effectively with an antidiarrheal medication.  No bleeding except a small amount of blood with nose blowing.  Objective:  Vital signs in last 24 hours:  Blood pressure 137/66, pulse (!) 56, temperature 98.1 F (36.7 C), temperature source Temporal, resp. rate 18, weight 222 lb 1.6 oz (100.7 kg), SpO2 100%.    HEENT: No thrush or ulcers. Lymphatics: Less than 1 cm firm palpable left axillary node.  Just inferior there is a palpable left axillary linear lymph node measuring approximately 1.5 cm.  Approximate 6 to 7 cm firm nodal mass in the left groin, superficial fungating component. Resp: Lungs clear bilaterally. Cardio: Regular rate and rhythm. GI: Abdomen soft and nontender.  No hepatosplenomegaly. Vascular: Trace lower leg edema bilaterally. Neuro: Alert and oriented. Skin: No significant rash.  Palms without erythema. Port-A-Cath without erythema.  Lab Results:  Lab Results  Component Value Date   WBC 3.5 (L) 07/21/2023   HGB 11.5 (L) 07/21/2023   HCT 35.8 (L) 07/21/2023   MCV 97.3 07/21/2023   PLT 118 (L) 07/21/2023   NEUTROABS 1.8 07/21/2023    Imaging:  No results found.  Medications: I have reviewed the patient's current medications.  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), isolated mesenteric implant-resected, multiple tumor deposits Sigmoid/descending resection and creation of a descending colostomy 04/10/2016 MSI-stable, no loss of mismatch repair protein expression Foundation 1- BRAF V600E positive, MS-stable, intermediate tumor mutation burden, no RAS mutation CT  abdomen/pelvis 04/02/2016-no evidence of distant metastatic disease Cycle 1 FOLFOX 05/21/2016 Cycle 2 FOLFOX 06/04/2016 Cycle 3 FOLFOX 06/18/2016 Cycle 4 FOLFOX 07/02/2016 (oxaliplatin held secondary to thrombocytopenia) Cycle 5 FOLFOX 07/16/2016 Cycle 6 FOLFOX 07/30/2016 Cycle 7 FOLFOX 08/13/2016 (oxaliplatin held and 5-FU dose reduced) Cycle 8 FOLFOX 09/03/2016 (oxaliplatin held secondary to neuropathy) Cycle 9 FOLFOX 09/17/2016 (oxaliplatin held secondary to neuropathy) Cycle 10 FOLFOX 10/02/2016 Cycle 11 FOLFOX 10/15/2016 (oxaliplatin eliminated from the regimen) Cycle 12 FOLFOX 10/30/2016 (oxaliplatin eliminated) CTs 05/12/2017-no evidence of recurrent disease, right inguinal hernia containing bladder Colonoscopy 07/21/2017, 3 polyps were removed from the descending and transverse colon, fragments of tubular and tubulovillous adenoma CTs 05/19/2018-no evidence of recurrent disease, status post hernia repair, right upper lobe pneumonia CTs 05/20/2019-resolution of right upper lobe pneumonia, no evidence of metastatic disease Colonoscopy 01/25/2020-multiple polyps removed-tubular adenomas, poor preparation, repeat colonoscopy recommended CTs neck, chest, and abdomen/pelvis 08/27/2020- new 2 centimeter left axillary node, new 1.9 cm left inguinal node chronic mildly prominent portacaval node Ultrasound biopsy of left inguinal node on 10/29/2020-metastatic adenocarcinoma consistent with colon adenocarcinoma PET 11/12/2020-enlarged hypermetabolic left inguinal and left axillary nodes, solitary focus of hypermetabolic activity in the right lower quadrant small bowel Cycle 1 FOLFOX 12/19/2020 Cycle 2 FOLFOX 01/02/2021, oxaliplatin dose reduced secondary to neutropenia and thrombocytopenia Cycle 3 FOLFOX 01/16/2021 Cycle 4 FOLFOX 01/30/2021 CTs 02/11/2021-no change in left inguinal and left axillary nodes, no evidence of disease progression, stable wall thickening involving a loop of distal ileum Cycle 1  FOLFIRI/Avastin 02/27/2021 Cycle 2 FOLFIRI/Avastin 03/13/2021 Cycle 3 FOLFIRI/Avastin 04/03/2021 Cycle 4 FOLFIRI 04/17/2021, Avastin held due to elevated urine protein 05/07/2021-24-hour urine protein elevated 1562 Cycle 5 FOLFIRI 05/08/2021, Avastin held due to  elevated urine protein, irinotecan dose reduced due to in general not feeling well after treatment CTs 05/17/2021-left axillary and left inguinal lymph nodes are smaller, no evidence of disease progression Cycle 6 FOLFIRI 05/22/2021, Avastin held due to proteinuria 06/03/2021 24-hour urine with 1.9 g of protein Cycle 7 FOLFIRI 06/12/2021, Avastin held due to proteinuria Cycle 8 FOLFIRI 07/03/2021, Avastin held due to proteinuria Cycle 9 FOLFIRI 07/24/2021, Avastin held due to proteinuria Cycle 10 FOLFIRI 08/19/2021, Avastin held due to proteinuria CTs 09/02/2021-no change in the left axillary, left inguinal, and portacaval lymph nodes.  No mention of the distal small bowel thickening Maintenance 5-fluorouracil 09/18/2021 CT abdomen/pelvis 10/28/2021-enlarged left inguinal lymph node, stable; stomach and small bowel grossly unremarkable. Maintenance 5-fluorouracil continued Colonoscopy 12/10/2021-terminal ileum with an ulcerated partially obstructing medium-sized mass approximately 15 to 20 cm from the IC valve, invasive adenocarcinoma, colonic type, moderately differentiated (grade 2) PET scan 12/18/2021-progression of disease in the peritoneal space.  2 intensely hypermetabolic nodes in the left axilla, one node is new from the prior, the other is reduced in size and metabolic activity; decrease in size and metabolic activity of left inguinal adenopathy; intense metabolic activity associate with a loop of small bowel in the right lower quadrant, no interval change; new small hypermetabolic peritoneal implant along the ventral peritoneal surface just right of midline; larger new peritoneal implant in the deep right pelvis; small new implant in the right mid  abdomen Encorafenib/Panitumumab 01/08/2022 CTs 03/17/2022-decrease size of mesenteric implant at the posterior bladder wall, small mesenteric peritoneal lesions identified on PET/CT or not evident.  Decrease size of left inguinal left axillary nodes, hypermetabolic activity noted at the anterior left prostate on comparison August 2023 PET CTs 07/21/2022-decrease size of a left axillary lymph node and right pelvic implant.  Left inguinal lymph node measures smaller.  No new sites of disease identified.  Similar tiny pulmonary nodules. Encorafenib and Panitumumab continued, Panitumumab changed from a 2-week schedule to a 3-week schedule 07/23/2022 CT 10/30/2022-stable left axillary lymph node, no new or progressive metastatic disease in the chest; stable left inguinal lymphadenopathy; decrease in size of deep right peritoneal implant; no new metastatic disease in the abdomen/pelvis. encorafenib and Panitumumab continued Panitumumab dose reduced 11/25/2022 due to skin rash CTs 02/20/2023-increased left inguinal adenopathy, developing left external iliac and ileocolic mesenteric adenopathy, unchanged left axillary node, enlarging pulmonary nodules minimal enlargement of cul-de-sac nodule Encorafenib and panitumumab continued Panitumumab every 2 weeks beginning 04/15/2023, continue encorafenib Restaging CTs 05/03/2023-new low-density liver lesions.  Progression of pelvic and less so abdominal nodal metastasis.  Mild progression of pulmonary metastasis.  Left axillary adenopathy improved. Cycle 1 Lonsurf 05/19/2023, Avastin every 2 weeks  Cycle 2 Lonsurf 06/23/2023, Avastin every 2 weeks 07/03/2023-24-hour urine with 7 g proteinuria Cycle 3 Lonsurf 07/22/2023, Avastin on hold due to proteinuria 2.   Chronic renal insufficiency   3.   Hypertension   4.  Inflamed sebaceous cyst at the upper back-status post incision and drainage   5.  Thrombocytopenia secondary chemotherapy-oxaliplatin dose reduced beginning  with cycle 3 FOLFOX, oxaliplatin held with cycle 4 and cycle 7 FOLFOX   6.  Oxaliplatin neuropathy-improved   7.  History of Mucositis secondary chemotherapy   8.  Symptoms of pneumonia January 2020-CT 05/19/2018 consistent with right upper lobe pneumonia, Levaquin prescribed 05/20/2018   9.  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  Anemia, progressive 10/29/2021 2 units packed red blood cells 10/31/2021 Ferritin  low 10/29/2021-oral iron Ferritin low 01/22/2022   12.  Colonoscopy 12/10/2021-terminal ileum with an ulcerated partially obstructing medium-sized mass approximately 15 to 20 cm from the IC valve, invasive adenocarcinoma, colonic type, moderately differentiated (grade 2) 13.  Admission 09/02/2022 with atrial flutter and rapid ventricular response 14.  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 Elmon Kirschner appears stable.  He has completed 2 cycles of Lonsurf/bevacizumab.  Recent 24-hour urine showed significant proteinuria.  Bevacizumab is now on hold.  Plan to proceed with cycle 3 Lonsurf beginning 07/22/2023.  He will complete another 24-hour urine prior to his office visit in 2 weeks.  He agrees with this plan.  CBC and chemistry panel reviewed.  Labs adequate to begin Lonsurf as above.  We discussed the ANC being in low normal range.  He understands to contact the office with any signs of infection.  Magnesium remains in normal range.  He will discontinue oral magnesium.  He will return for follow-up as scheduled in 2 weeks.  He will contact the office in the interim with any problems.    Lonna Cobb ANP/GNP-BC   07/21/2023  11:02 AM

## 2023-07-21 NOTE — Patient Instructions (Signed)

## 2023-07-22 DIAGNOSIS — Z01818 Encounter for other preprocedural examination: Secondary | ICD-10-CM | POA: Diagnosis not present

## 2023-07-22 DIAGNOSIS — Z7901 Long term (current) use of anticoagulants: Secondary | ICD-10-CM | POA: Diagnosis not present

## 2023-07-23 ENCOUNTER — Other Ambulatory Visit: Payer: Self-pay

## 2023-07-23 DIAGNOSIS — N1832 Chronic kidney disease, stage 3b: Secondary | ICD-10-CM | POA: Diagnosis not present

## 2023-07-23 DIAGNOSIS — I10 Essential (primary) hypertension: Secondary | ICD-10-CM | POA: Diagnosis not present

## 2023-07-23 DIAGNOSIS — H25813 Combined forms of age-related cataract, bilateral: Secondary | ICD-10-CM | POA: Diagnosis not present

## 2023-07-26 ENCOUNTER — Other Ambulatory Visit: Payer: Self-pay

## 2023-07-28 DIAGNOSIS — H401120 Primary open-angle glaucoma, left eye, stage unspecified: Secondary | ICD-10-CM | POA: Diagnosis not present

## 2023-07-28 DIAGNOSIS — Z85038 Personal history of other malignant neoplasm of large intestine: Secondary | ICD-10-CM | POA: Diagnosis not present

## 2023-07-28 DIAGNOSIS — H401124 Primary open-angle glaucoma, left eye, indeterminate stage: Secondary | ICD-10-CM | POA: Diagnosis not present

## 2023-07-28 DIAGNOSIS — I129 Hypertensive chronic kidney disease with stage 1 through stage 4 chronic kidney disease, or unspecified chronic kidney disease: Secondary | ICD-10-CM | POA: Diagnosis not present

## 2023-07-28 DIAGNOSIS — H5703 Miosis: Secondary | ICD-10-CM | POA: Diagnosis not present

## 2023-07-28 DIAGNOSIS — I4891 Unspecified atrial fibrillation: Secondary | ICD-10-CM | POA: Diagnosis not present

## 2023-07-28 DIAGNOSIS — H25812 Combined forms of age-related cataract, left eye: Secondary | ICD-10-CM | POA: Diagnosis not present

## 2023-07-28 DIAGNOSIS — N189 Chronic kidney disease, unspecified: Secondary | ICD-10-CM | POA: Diagnosis not present

## 2023-07-28 DIAGNOSIS — E785 Hyperlipidemia, unspecified: Secondary | ICD-10-CM | POA: Diagnosis not present

## 2023-07-28 DIAGNOSIS — Z7901 Long term (current) use of anticoagulants: Secondary | ICD-10-CM | POA: Diagnosis not present

## 2023-07-28 DIAGNOSIS — H2181 Floppy iris syndrome: Secondary | ICD-10-CM | POA: Diagnosis not present

## 2023-07-30 ENCOUNTER — Inpatient Hospital Stay

## 2023-07-30 DIAGNOSIS — N189 Chronic kidney disease, unspecified: Secondary | ICD-10-CM | POA: Diagnosis not present

## 2023-07-30 DIAGNOSIS — D6959 Other secondary thrombocytopenia: Secondary | ICD-10-CM | POA: Diagnosis not present

## 2023-07-30 DIAGNOSIS — Z5112 Encounter for antineoplastic immunotherapy: Secondary | ICD-10-CM | POA: Diagnosis not present

## 2023-07-30 DIAGNOSIS — C187 Malignant neoplasm of sigmoid colon: Secondary | ICD-10-CM | POA: Diagnosis not present

## 2023-07-30 DIAGNOSIS — C7800 Secondary malignant neoplasm of unspecified lung: Secondary | ICD-10-CM | POA: Diagnosis not present

## 2023-07-30 DIAGNOSIS — C774 Secondary and unspecified malignant neoplasm of inguinal and lower limb lymph nodes: Secondary | ICD-10-CM | POA: Diagnosis not present

## 2023-07-30 DIAGNOSIS — C189 Malignant neoplasm of colon, unspecified: Secondary | ICD-10-CM

## 2023-07-31 LAB — PROTEIN, URINE, 24 HOUR
Collection Interval-UPROT: 24 h
Protein, 24H Urine: 6751 mg/d — ABNORMAL HIGH (ref 50–100)
Protein, Urine: 314 mg/dL
Urine Total Volume-UPROT: 2150 mL

## 2023-08-04 ENCOUNTER — Telehealth: Payer: Self-pay | Admitting: *Deleted

## 2023-08-04 ENCOUNTER — Inpatient Hospital Stay (HOSPITAL_BASED_OUTPATIENT_CLINIC_OR_DEPARTMENT_OTHER): Admitting: Oncology

## 2023-08-04 ENCOUNTER — Inpatient Hospital Stay

## 2023-08-04 ENCOUNTER — Inpatient Hospital Stay: Attending: Oncology

## 2023-08-04 VITALS — BP 135/72 | HR 62 | Temp 98.1°F | Resp 18 | Ht 76.0 in | Wt 215.6 lb

## 2023-08-04 DIAGNOSIS — N189 Chronic kidney disease, unspecified: Secondary | ICD-10-CM | POA: Insufficient documentation

## 2023-08-04 DIAGNOSIS — C774 Secondary and unspecified malignant neoplasm of inguinal and lower limb lymph nodes: Secondary | ICD-10-CM | POA: Insufficient documentation

## 2023-08-04 DIAGNOSIS — Z5112 Encounter for antineoplastic immunotherapy: Secondary | ICD-10-CM | POA: Diagnosis not present

## 2023-08-04 DIAGNOSIS — Z5111 Encounter for antineoplastic chemotherapy: Secondary | ICD-10-CM | POA: Diagnosis not present

## 2023-08-04 DIAGNOSIS — R809 Proteinuria, unspecified: Secondary | ICD-10-CM | POA: Diagnosis not present

## 2023-08-04 DIAGNOSIS — C189 Malignant neoplasm of colon, unspecified: Secondary | ICD-10-CM

## 2023-08-04 DIAGNOSIS — I129 Hypertensive chronic kidney disease with stage 1 through stage 4 chronic kidney disease, or unspecified chronic kidney disease: Secondary | ICD-10-CM | POA: Insufficient documentation

## 2023-08-04 DIAGNOSIS — Z95828 Presence of other vascular implants and grafts: Secondary | ICD-10-CM

## 2023-08-04 DIAGNOSIS — G620 Drug-induced polyneuropathy: Secondary | ICD-10-CM | POA: Diagnosis not present

## 2023-08-04 DIAGNOSIS — D6959 Other secondary thrombocytopenia: Secondary | ICD-10-CM | POA: Diagnosis not present

## 2023-08-04 DIAGNOSIS — C187 Malignant neoplasm of sigmoid colon: Secondary | ICD-10-CM | POA: Diagnosis not present

## 2023-08-04 DIAGNOSIS — C787 Secondary malignant neoplasm of liver and intrahepatic bile duct: Secondary | ICD-10-CM | POA: Diagnosis not present

## 2023-08-04 DIAGNOSIS — D649 Anemia, unspecified: Secondary | ICD-10-CM | POA: Insufficient documentation

## 2023-08-04 DIAGNOSIS — Z452 Encounter for adjustment and management of vascular access device: Secondary | ICD-10-CM | POA: Diagnosis not present

## 2023-08-04 DIAGNOSIS — I4891 Unspecified atrial fibrillation: Secondary | ICD-10-CM | POA: Insufficient documentation

## 2023-08-04 LAB — CMP (CANCER CENTER ONLY)
ALT: 6 U/L (ref 0–44)
AST: 9 U/L — ABNORMAL LOW (ref 15–41)
Albumin: 3.4 g/dL — ABNORMAL LOW (ref 3.5–5.0)
Alkaline Phosphatase: 102 U/L (ref 38–126)
Anion gap: 7 (ref 5–15)
BUN: 35 mg/dL — ABNORMAL HIGH (ref 8–23)
CO2: 19 mmol/L — ABNORMAL LOW (ref 22–32)
Calcium: 8 mg/dL — ABNORMAL LOW (ref 8.9–10.3)
Chloride: 110 mmol/L (ref 98–111)
Creatinine: 2.53 mg/dL — ABNORMAL HIGH (ref 0.61–1.24)
GFR, Estimated: 26 mL/min — ABNORMAL LOW (ref >60)
Glucose, Bld: 126 mg/dL — ABNORMAL HIGH (ref 70–99)
Potassium: 4.1 mmol/L (ref 3.5–5.1)
Sodium: 136 mmol/L (ref 135–145)
Total Bilirubin: 0.3 mg/dL (ref 0.0–1.2)
Total Protein: 6.5 g/dL (ref 6.5–8.1)

## 2023-08-04 LAB — CBC WITH DIFFERENTIAL (CANCER CENTER ONLY)
Abs Immature Granulocytes: 0.01 10*3/uL (ref 0.00–0.07)
Basophils Absolute: 0 10*3/uL (ref 0.0–0.1)
Basophils Relative: 0 %
Eosinophils Absolute: 0.1 10*3/uL (ref 0.0–0.5)
Eosinophils Relative: 3 %
HCT: 34 % — ABNORMAL LOW (ref 39.0–52.0)
Hemoglobin: 11.1 g/dL — ABNORMAL LOW (ref 13.0–17.0)
Immature Granulocytes: 0 %
Lymphocytes Relative: 25 %
Lymphs Abs: 0.8 10*3/uL (ref 0.7–4.0)
MCH: 31.4 pg (ref 26.0–34.0)
MCHC: 32.6 g/dL (ref 30.0–36.0)
MCV: 96.3 fL (ref 80.0–100.0)
Monocytes Absolute: 0.3 10*3/uL (ref 0.1–1.0)
Monocytes Relative: 9 %
Neutro Abs: 2 10*3/uL (ref 1.7–7.7)
Neutrophils Relative %: 63 %
Platelet Count: 101 10*3/uL — ABNORMAL LOW (ref 150–400)
RBC: 3.53 MIL/uL — ABNORMAL LOW (ref 4.22–5.81)
RDW: 18.4 % — ABNORMAL HIGH (ref 11.5–15.5)
WBC Count: 3.2 10*3/uL — ABNORMAL LOW (ref 4.0–10.5)
nRBC: 0 % (ref 0.0–0.2)

## 2023-08-04 LAB — CEA (ACCESS): CEA (CHCC): 3.62 ng/mL (ref 0.00–5.00)

## 2023-08-04 LAB — MAGNESIUM: Magnesium: 1.9 mg/dL (ref 1.7–2.4)

## 2023-08-04 LAB — TOTAL PROTEIN, URINE DIPSTICK: Protein, ur: 100 mg/dL — AB

## 2023-08-04 MED ORDER — SODIUM CHLORIDE 0.9% FLUSH
10.0000 mL | INTRAVENOUS | Status: DC | PRN
  Administered 2023-08-04: 10 mL via INTRAVENOUS

## 2023-08-04 MED ORDER — SODIUM CHLORIDE 0.9% FLUSH
10.0000 mL | Freq: Once | INTRAVENOUS | Status: AC
  Administered 2023-08-04: 10 mL via INTRAVENOUS

## 2023-08-04 MED ORDER — HEPARIN SOD (PORK) LOCK FLUSH 100 UNIT/ML IV SOLN
500.0000 [IU] | Freq: Once | INTRAVENOUS | Status: AC
  Administered 2023-08-04: 500 [IU] via INTRAVENOUS

## 2023-08-04 NOTE — Patient Instructions (Signed)

## 2023-08-04 NOTE — Progress Notes (Signed)
 Patient seen by Dr. Thornton Papas today  Vitals are within treatment parameters:Yes   Labs are within treatment parameters: No (Please specify and give further instructions.) 24 hour urine protein 6751.  Treatment plan has been signed: Yes   Per physician team, Patient will not be receiving treatment today.

## 2023-08-04 NOTE — Progress Notes (Signed)
  [facility] PROGRESS [facility]   Diagnosis: Colon cancer  INTERVAL HISTORY:   Louis [name] is as scheduled.  He began another cycle of Lonsurf on 07/22/2023.  He reports the left groin mass has broken through the skin and there has been mild associated bleeding.  He had an episode of nausea and vomiting after eating seafood at the beach last weekend.  The GI symptoms lasted for 2 days.  Objective:  Vital signs in last 24 hours:  Blood pressure 135/72, pulse 62, temperature 98.1 F (36.7 C), temperature source Temporal, resp. rate 18, height 6\' 4"  (1.93 m), weight 215 lb 9.6 oz (97.8 kg), SpO2 100%.    HEENT: No thrush or ulcers Lymphatics: No cervical, supraclavicular, or right inguinal nodes.  1 cm left axillary node, 1/2-1 cm right axillary node.  Firm nodal mass with a 2 cm fungating area breaking through the skin at the left groin.  The nodal mass measures greater than 8 cm.  Small amount of blood on his underwear at the left groin site. Resp: Lungs clear bilaterally Cardio: Regular rate and rhythm GI: No hepatosplenomegaly, nontender, no mass Vascular: No leg edema    Portacath/PICC-without erythema  Lab Results:  Lab Results  Component Value Date   WBC 3.2 (L) 08/04/2023   HGB 11.1 (L) 08/04/2023   HCT 34.0 (L) 08/04/2023   MCV 96.3 08/04/2023   PLT 101 (L) 08/04/2023   NEUTROABS 2.0 08/04/2023    CMP  Lab Results  Component Value Date   NA 136 08/04/2023   K 4.1 08/04/2023   CL 110 08/04/2023   CO2 19 (L) 08/04/2023   GLUCOSE 126 (H) 08/04/2023   BUN 35 (H) 08/04/2023   CREATININE 2.53 (H) 08/04/2023   CALCIUM 8.0 (L) 08/04/2023   PROT 6.5 08/04/2023   ALBUMIN 3.4 (L) 08/04/2023   AST 9 (L) 08/04/2023   ALT 6 08/04/2023   ALKPHOS 102 08/04/2023   BILITOT 0.3 08/04/2023   GFRNONAA 26 (L) 08/04/2023   GFRAA 28 (L) 09/20/2019    Lab Results  Component Value Date   CEA1 2.61 01/02/2021   CEA 4.13 05/20/2023    Lab Results   Component Value Date   INR 1.3 (H) 09/14/2022   LABPROT 16.8 (H) 09/14/2022    Imaging:  No results found.  Medications: I have reviewed the patient's current medications.   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), isolated mesenteric implant-resected, multiple tumor deposits Sigmoid/descending resection and creation of a descending colostomy 04/10/2016 MSI-stable, no loss of mismatch repair protein expression Foundation 1- BRAF V600E positive, MS-stable, intermediate tumor mutation burden, no RAS mutation CT abdomen/pelvis 04/02/2016-no evidence of distant metastatic disease Cycle 1 FOLFOX 05/21/2016 Cycle 2 FOLFOX 06/04/2016 Cycle 3 FOLFOX 06/18/2016 Cycle 4 FOLFOX 07/02/2016 (oxaliplatin held secondary to thrombocytopenia) Cycle 5 FOLFOX 07/16/2016 Cycle 6 FOLFOX 07/30/2016 Cycle 7 FOLFOX 08/13/2016 (oxaliplatin held and 5-FU dose reduced) Cycle 8 FOLFOX 09/03/2016 (oxaliplatin held secondary to neuropathy) Cycle 9 FOLFOX 09/17/2016 (oxaliplatin held secondary to neuropathy) Cycle 10 FOLFOX 10/02/2016 Cycle 11 FOLFOX 10/15/2016 (oxaliplatin eliminated from the regimen) Cycle 12 FOLFOX 10/30/2016 (oxaliplatin eliminated) CTs 05/12/2017-no evidence of recurrent disease, right inguinal hernia containing bladder Colonoscopy 07/21/2017, 3 polyps were removed from the descending and transverse colon, fragments of tubular and tubulovillous adenoma CTs 05/19/2018-no evidence of recurrent disease, status post hernia repair, right upper lobe pneumonia CTs 05/20/2019-resolution of right upper lobe pneumonia, no evidence of metastatic disease Colonoscopy 01/25/2020-multiple polyps removed-tubular adenomas, poor preparation, repeat  colonoscopy recommended CTs neck, chest, and abdomen/pelvis 08/27/2020- new 2 centimeter left axillary node, new 1.9 cm left inguinal node chronic mildly prominent portacaval node Ultrasound biopsy of left inguinal node on 10/29/2020-metastatic  adenocarcinoma consistent with colon adenocarcinoma PET 11/12/2020-enlarged hypermetabolic left inguinal and left axillary nodes, solitary focus of hypermetabolic activity in the right lower quadrant small bowel Cycle 1 FOLFOX 12/19/2020 Cycle 2 FOLFOX 01/02/2021, oxaliplatin dose reduced secondary to neutropenia and thrombocytopenia Cycle 3 FOLFOX 01/16/2021 Cycle 4 FOLFOX 01/30/2021 CTs 02/11/2021-no change in left inguinal and left axillary nodes, no evidence of disease progression, stable wall thickening involving a loop of distal ileum Cycle 1 FOLFIRI/Avastin 02/27/2021 Cycle 2 FOLFIRI/Avastin 03/13/2021 Cycle 3 FOLFIRI/Avastin 04/03/2021 Cycle 4 FOLFIRI 04/17/2021, Avastin held due to elevated urine protein 05/07/2021-24-hour urine protein elevated 1562 Cycle 5 FOLFIRI 05/08/2021, Avastin held due to elevated urine protein, irinotecan dose reduced due to in general not feeling well after treatment CTs 05/17/2021-left axillary and left inguinal lymph nodes are smaller, no evidence of disease progression Cycle 6 FOLFIRI 05/22/2021, Avastin held due to proteinuria 06/03/2021 24-hour urine with 1.9 g of protein Cycle 7 FOLFIRI 06/12/2021, Avastin held due to proteinuria Cycle 8 FOLFIRI 07/03/2021, Avastin held due to proteinuria Cycle 9 FOLFIRI 07/24/2021, Avastin held due to proteinuria Cycle 10 FOLFIRI 08/19/2021, Avastin held due to proteinuria CTs 09/02/2021-no change in the left axillary, left inguinal, and portacaval lymph nodes.  No mention of the distal small bowel thickening Maintenance 5-fluorouracil 09/18/2021 CT abdomen/pelvis 10/28/2021-enlarged left inguinal lymph node, stable; stomach and small bowel grossly unremarkable. Maintenance 5-fluorouracil continued Colonoscopy 12/10/2021-terminal ileum with an ulcerated partially obstructing medium-sized mass approximately 15 to 20 cm from the IC valve, invasive adenocarcinoma, colonic type, moderately differentiated (grade 2) PET scan  12/18/2021-progression of disease in the peritoneal space.  2 intensely hypermetabolic nodes in the left axilla, one node is new from the prior, the other is reduced in size and metabolic activity; decrease in size and metabolic activity of left inguinal adenopathy; intense metabolic activity associate with a loop of small bowel in the right lower quadrant, no interval change; new small hypermetabolic peritoneal implant along the ventral peritoneal surface just right of midline; larger new peritoneal implant in the deep right pelvis; small new implant in the right mid abdomen Encorafenib/Panitumumab 01/08/2022 CTs 03/17/2022-decrease size of mesenteric implant at the posterior bladder wall, small mesenteric peritoneal lesions identified on PET/CT or not evident.  Decrease size of left inguinal left axillary nodes, hypermetabolic activity noted at the anterior left prostate on comparison August 2023 PET CTs 07/21/2022-decrease size of a left axillary lymph node and right pelvic implant.  Left inguinal lymph node measures smaller.  No new sites of disease identified.  Similar tiny pulmonary nodules. Encorafenib and Panitumumab continued, Panitumumab changed from a 2-week schedule to a 3-week schedule 07/23/2022 CT 10/30/2022-stable left axillary lymph node, no new or progressive metastatic disease in the chest; stable left inguinal lymphadenopathy; decrease in size of deep right peritoneal implant; no new metastatic disease in the abdomen/pelvis. encorafenib and Panitumumab continued Panitumumab dose reduced 11/25/2022 due to skin rash CTs 02/20/2023-increased left inguinal adenopathy, developing left external iliac and ileocolic mesenteric adenopathy, unchanged left axillary node, enlarging pulmonary nodules minimal enlargement of cul-de-sac nodule Encorafenib and panitumumab continued Panitumumab every 2 weeks beginning 04/15/2023, continue encorafenib Restaging CTs 05/03/2023-new low-density liver lesions.   Progression of pelvic and less so abdominal nodal metastasis.  Mild progression of pulmonary metastasis.  Left axillary adenopathy improved. Cycle 1 Lonsurf 05/19/2023, Avastin every 2  weeks  Cycle 2 Lonsurf 06/23/2023, Avastin every 2 weeks 07/03/2023-24-hour urine with 7 g proteinuria Cycle 3 Lonsurf 07/22/2023, Avastin on hold due to proteinuria 2.   Chronic renal insufficiency   3.   Hypertension   4.  Inflamed sebaceous cyst at the upper back-status post incision and drainage   5.  Thrombocytopenia secondary chemotherapy-oxaliplatin dose reduced beginning with cycle 3 FOLFOX, oxaliplatin held with cycle 4 and cycle 7 FOLFOX   6.  Oxaliplatin neuropathy-improved   7.  History of Mucositis secondary chemotherapy   8.  Symptoms of pneumonia January 2020-CT 05/19/2018 consistent with right upper lobe pneumonia, Levaquin prescribed 05/20/2018   9.  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  Anemia, progressive 10/29/2021 2 units packed red blood cells 10/31/2021 Ferritin low 10/29/2021-oral iron Ferritin low 01/22/2022   12.  Colonoscopy 12/10/2021-terminal ileum with an ulcerated partially obstructing medium-sized mass approximately 15 to 20 cm from the IC valve, invasive adenocarcinoma, colonic type, moderately differentiated (grade 2) 13.  Admission 09/02/2022 with atrial flutter and rapid ventricular response 14.  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  History of atrial fibrillation/atrial flutter-apixaban discontinued 08/04/2023 secondary to bleeding from the left groin mass       Disposition: Mr Kathe [name] has metastatic colon cancer.  He is completing a third cycle of Lonsurf.  The left groin mass is larger with a fungating component and mild bleeding.  He is scheduled to complete the current cycle of Lonsurf tomorrow morning.  He will be referred for restaging CTs prior to an office visit in 2 weeks.  We will consider palliative radiation to the  left groin if there is no evidence of systemic progression.  Systemic treatment options are limited given his treatment history.  Bevacizumab will remain on hold due to bleeding and proteinuria.  I contacted cardiology regarding anticoagulation therapy.  I recommend discontinuing apixaban with the bleeding at the left groin mass.  Cardiology is in agreement.  Thornton Papas, MD  08/04/2023  11:52 AM

## 2023-08-04 NOTE — Telephone Encounter (Signed)
 LVM for patient to check Mychart for message regarding his CT scan for 4/9 and requested he respond to confirm.

## 2023-08-05 ENCOUNTER — Other Ambulatory Visit (HOSPITAL_COMMUNITY): Payer: Self-pay

## 2023-08-05 DIAGNOSIS — Z01818 Encounter for other preprocedural examination: Secondary | ICD-10-CM | POA: Diagnosis not present

## 2023-08-06 ENCOUNTER — Encounter: Payer: Self-pay | Admitting: *Deleted

## 2023-08-06 ENCOUNTER — Other Ambulatory Visit: Payer: Self-pay | Admitting: Oncology

## 2023-08-06 ENCOUNTER — Other Ambulatory Visit (HOSPITAL_COMMUNITY): Payer: Self-pay

## 2023-08-06 DIAGNOSIS — C189 Malignant neoplasm of colon, unspecified: Secondary | ICD-10-CM

## 2023-08-06 NOTE — Progress Notes (Signed)
 Specialty Pharmacy Ongoing Clinical Assessment Note  Sherrine Maples. is a 78 y.o. male who is being followed by the specialty pharmacy service for RxSp Oncology   Patient's specialty medication(s) reviewed today: Trifluridine-Tipiracil (Lonsurf)   Missed doses in the last 4 weeks: 1 (patient was sick)   Patient/Caregiver did not have any additional questions or concerns.   Therapeutic benefit summary: Patient is achieving benefit   Adverse events/side effects summary: No adverse events/side effects   Patient's therapy is appropriate to: Continue    Goals Addressed             This Visit's Progress    Slow Disease Progression       Patient is not on track and worsening. Patient will maintain adherence and be monitored by provider to determine if a change in treatment plan is warranted.  Dr. Kalman Drape office visit notes from 08/04/23 indicate that the groin mass is larger, he has ordered a restaging CT and is considering adding on palliative radiation; he is closely monitoring the patient.         Follow up:  3 months  Servando Snare Specialty Pharmacist

## 2023-08-06 NOTE — Progress Notes (Signed)
 Specialty Pharmacy Refill Coordination Note  Sherrine Maples. is a 78 y.o. male contacted today regarding refills of specialty medication(s) Trifluridine-Tipiracil [name]   Patient requested Delivery   Delivery date: 08/14/23   Verified address: [address] [place],  [place] [address]   Medication will be filled on 08/13/23. This fill date is pending response to refill request from provider. Patient is aware and if they have not received fill by intended date they must follow up with pharmacy.

## 2023-08-07 NOTE — Telephone Encounter (Signed)
 Lonsurf on hold due to bleeding. Will re-evaluate on 4/15.

## 2023-08-11 DIAGNOSIS — I4891 Unspecified atrial fibrillation: Secondary | ICD-10-CM | POA: Diagnosis not present

## 2023-08-11 DIAGNOSIS — H52223 Regular astigmatism, bilateral: Secondary | ICD-10-CM | POA: Diagnosis not present

## 2023-08-11 DIAGNOSIS — Z7901 Long term (current) use of anticoagulants: Secondary | ICD-10-CM | POA: Diagnosis not present

## 2023-08-11 DIAGNOSIS — Z79899 Other long term (current) drug therapy: Secondary | ICD-10-CM | POA: Diagnosis not present

## 2023-08-11 DIAGNOSIS — E785 Hyperlipidemia, unspecified: Secondary | ICD-10-CM | POA: Diagnosis not present

## 2023-08-11 DIAGNOSIS — H401134 Primary open-angle glaucoma, bilateral, indeterminate stage: Secondary | ICD-10-CM | POA: Diagnosis not present

## 2023-08-11 DIAGNOSIS — H25811 Combined forms of age-related cataract, right eye: Secondary | ICD-10-CM | POA: Diagnosis not present

## 2023-08-11 DIAGNOSIS — H2181 Floppy iris syndrome: Secondary | ICD-10-CM | POA: Diagnosis not present

## 2023-08-11 DIAGNOSIS — H401114 Primary open-angle glaucoma, right eye, indeterminate stage: Secondary | ICD-10-CM | POA: Diagnosis not present

## 2023-08-11 DIAGNOSIS — H5703 Miosis: Secondary | ICD-10-CM | POA: Diagnosis not present

## 2023-08-11 DIAGNOSIS — H401111 Primary open-angle glaucoma, right eye, mild stage: Secondary | ICD-10-CM | POA: Diagnosis not present

## 2023-08-11 DIAGNOSIS — Z85038 Personal history of other malignant neoplasm of large intestine: Secondary | ICD-10-CM | POA: Diagnosis not present

## 2023-08-11 DIAGNOSIS — N189 Chronic kidney disease, unspecified: Secondary | ICD-10-CM | POA: Diagnosis not present

## 2023-08-11 DIAGNOSIS — H35371 Puckering of macula, right eye: Secondary | ICD-10-CM | POA: Diagnosis not present

## 2023-08-11 DIAGNOSIS — I129 Hypertensive chronic kidney disease with stage 1 through stage 4 chronic kidney disease, or unspecified chronic kidney disease: Secondary | ICD-10-CM | POA: Diagnosis not present

## 2023-08-11 DIAGNOSIS — H401110 Primary open-angle glaucoma, right eye, stage unspecified: Secondary | ICD-10-CM | POA: Diagnosis not present

## 2023-08-12 ENCOUNTER — Ambulatory Visit (HOSPITAL_BASED_OUTPATIENT_CLINIC_OR_DEPARTMENT_OTHER)

## 2023-08-12 ENCOUNTER — Other Ambulatory Visit

## 2023-08-13 ENCOUNTER — Ambulatory Visit (HOSPITAL_BASED_OUTPATIENT_CLINIC_OR_DEPARTMENT_OTHER)
Admission: RE | Admit: 2023-08-13 | Discharge: 2023-08-13 | Disposition: A | Discharge status: Home / Self Care | Source: Ambulatory Visit | Attending: Oncology | Admitting: Oncology

## 2023-08-13 DIAGNOSIS — C189 Malignant neoplasm of colon, unspecified: Secondary | ICD-10-CM

## 2023-08-13 DIAGNOSIS — R590 Localized enlarged lymph nodes: Secondary | ICD-10-CM | POA: Diagnosis not present

## 2023-08-13 DIAGNOSIS — K8020 Calculus of gallbladder without cholecystitis without obstruction: Secondary | ICD-10-CM | POA: Diagnosis not present

## 2023-08-13 DIAGNOSIS — C787 Secondary malignant neoplasm of liver and intrahepatic bile duct: Secondary | ICD-10-CM | POA: Diagnosis not present

## 2023-08-16 ENCOUNTER — Other Ambulatory Visit: Payer: Self-pay | Admitting: Oncology

## 2023-08-18 ENCOUNTER — Inpatient Hospital Stay

## 2023-08-18 ENCOUNTER — Other Ambulatory Visit (HOSPITAL_COMMUNITY): Payer: Self-pay

## 2023-08-18 ENCOUNTER — Inpatient Hospital Stay (HOSPITAL_BASED_OUTPATIENT_CLINIC_OR_DEPARTMENT_OTHER): Admitting: Oncology

## 2023-08-18 ENCOUNTER — Other Ambulatory Visit: Payer: Self-pay

## 2023-08-18 ENCOUNTER — Other Ambulatory Visit: Payer: Self-pay | Admitting: Oncology

## 2023-08-18 VITALS — BP 138/64 | HR 60 | Temp 98.1°F | Resp 18 | Ht 76.0 in | Wt 218.6 lb

## 2023-08-18 DIAGNOSIS — C187 Malignant neoplasm of sigmoid colon: Secondary | ICD-10-CM | POA: Diagnosis not present

## 2023-08-18 DIAGNOSIS — Z95828 Presence of other vascular implants and grafts: Secondary | ICD-10-CM

## 2023-08-18 DIAGNOSIS — C189 Malignant neoplasm of colon, unspecified: Secondary | ICD-10-CM

## 2023-08-18 DIAGNOSIS — C787 Secondary malignant neoplasm of liver and intrahepatic bile duct: Secondary | ICD-10-CM | POA: Diagnosis not present

## 2023-08-18 DIAGNOSIS — Z5111 Encounter for antineoplastic chemotherapy: Secondary | ICD-10-CM | POA: Diagnosis not present

## 2023-08-18 DIAGNOSIS — N189 Chronic kidney disease, unspecified: Secondary | ICD-10-CM | POA: Diagnosis not present

## 2023-08-18 DIAGNOSIS — C774 Secondary and unspecified malignant neoplasm of inguinal and lower limb lymph nodes: Secondary | ICD-10-CM | POA: Diagnosis not present

## 2023-08-18 DIAGNOSIS — Z5112 Encounter for antineoplastic immunotherapy: Secondary | ICD-10-CM | POA: Diagnosis not present

## 2023-08-18 LAB — CBC WITH DIFFERENTIAL (CANCER CENTER ONLY)
Abs Immature Granulocytes: 0.01 10*3/uL (ref 0.00–0.07)
Basophils Absolute: 0 10*3/uL (ref 0.0–0.1)
Basophils Relative: 1 %
Eosinophils Absolute: 0.1 10*3/uL (ref 0.0–0.5)
Eosinophils Relative: 4 %
HCT: 35.4 % — ABNORMAL LOW (ref 39.0–52.0)
Hemoglobin: 11.5 g/dL — ABNORMAL LOW (ref 13.0–17.0)
Immature Granulocytes: 0 %
Lymphocytes Relative: 32 %
Lymphs Abs: 0.9 10*3/uL (ref 0.7–4.0)
MCH: 31.4 pg (ref 26.0–34.0)
MCHC: 32.5 g/dL (ref 30.0–36.0)
MCV: 96.7 fL (ref 80.0–100.0)
Monocytes Absolute: 0.6 10*3/uL (ref 0.1–1.0)
Monocytes Relative: 21 %
Neutro Abs: 1.2 10*3/uL — ABNORMAL LOW (ref 1.7–7.7)
Neutrophils Relative %: 42 %
Platelet Count: 101 10*3/uL — ABNORMAL LOW (ref 150–400)
RBC: 3.66 MIL/uL — ABNORMAL LOW (ref 4.22–5.81)
RDW: 18.6 % — ABNORMAL HIGH (ref 11.5–15.5)
WBC Count: 2.9 10*3/uL — ABNORMAL LOW (ref 4.0–10.5)
nRBC: 0 % (ref 0.0–0.2)

## 2023-08-18 LAB — CMP (CANCER CENTER ONLY)
ALT: 6 U/L (ref 0–44)
AST: 9 U/L — ABNORMAL LOW (ref 15–41)
Albumin: 3.5 g/dL (ref 3.5–5.0)
Alkaline Phosphatase: 120 U/L (ref 38–126)
Anion gap: 5 (ref 5–15)
BUN: 38 mg/dL — ABNORMAL HIGH (ref 8–23)
CO2: 20 mmol/L — ABNORMAL LOW (ref 22–32)
Calcium: 7.9 mg/dL — ABNORMAL LOW (ref 8.9–10.3)
Chloride: 111 mmol/L (ref 98–111)
Creatinine: 2.56 mg/dL — ABNORMAL HIGH (ref 0.61–1.24)
GFR, Estimated: 25 mL/min — ABNORMAL LOW (ref >60)
Glucose, Bld: 91 mg/dL (ref 70–99)
Potassium: 4.9 mmol/L (ref 3.5–5.1)
Sodium: 136 mmol/L (ref 135–145)
Total Bilirubin: 0.3 mg/dL (ref 0.0–1.2)
Total Protein: 6.2 g/dL — ABNORMAL LOW (ref 6.5–8.1)

## 2023-08-18 LAB — MAGNESIUM: Magnesium: 2 mg/dL (ref 1.7–2.4)

## 2023-08-18 LAB — CEA (ACCESS): CEA (CHCC): 3.88 ng/mL (ref 0.00–5.00)

## 2023-08-18 MED ORDER — SODIUM CHLORIDE 0.9% FLUSH
10.0000 mL | Freq: Once | INTRAVENOUS | Status: DC
  Administered 2023-08-18: 10 mL via INTRAVENOUS

## 2023-08-18 MED ORDER — HEPARIN SOD (PORK) LOCK FLUSH 100 UNIT/ML IV SOLN
500.0000 [IU] | Freq: Once | INTRAVENOUS | Status: AC
Start: 2023-08-18 — End: 2023-08-18
  Administered 2023-08-18: 500 [IU] via INTRAVENOUS

## 2023-08-18 NOTE — Progress Notes (Signed)
 Refill request denied. Pt have appointment today. Pending conclusion of visit.

## 2023-08-18 NOTE — Progress Notes (Signed)
 Louis [name] OFFICE PROGRESS NOTE   Diagnosis: Colon cancer  INTERVAL HISTORY:   Louis [name] returns as scheduled.  He reports no further bleeding at the left groin mass.  The mass is not painful.  He reports malaise.  Objective:  Vital signs in last 24 hours:  Blood pressure 138/64, pulse 60, temperature 98.1 F (36.7 C), temperature source Temporal, resp. rate 18, height 6\' 4"  (1.93 m), weight 218 lb 9.6 oz (99.2 kg), SpO2 100%.     Lymphatics: 1 cm axillary nodes approximately 10 cm left inguinal mass with a fungating 1.5-2 cm area growing through the skin Resp: Lungs clear bilaterally Cardio: Regular rate and rhythm GI: No mass, no hepatosplenomegaly Vascular: Leg edema  Portacath/PICC-without erythema  Lab Results:  Lab Results  Component Value Date   WBC 2.9 (L) 08/18/2023   HGB 11.5 (L) 08/18/2023   HCT 35.4 (L) 08/18/2023   MCV 96.7 08/18/2023   PLT 101 (L) 08/18/2023   NEUTROABS 1.2 (L) 08/18/2023    CMP  Lab Results  Component Value Date   NA 136 08/18/2023   K 4.9 08/18/2023   CL 111 08/18/2023   CO2 20 (L) 08/18/2023   GLUCOSE 91 08/18/2023   BUN 38 (H) 08/18/2023   CREATININE 2.56 (H) 08/18/2023   CALCIUM 7.9 (L) 08/18/2023   PROT 6.2 (L) 08/18/2023   ALBUMIN 3.5 08/18/2023   AST 9 (L) 08/18/2023   ALT 6 08/18/2023   ALKPHOS 120 08/18/2023   BILITOT 0.3 08/18/2023   GFRNONAA 25 (L) 08/18/2023   GFRAA 28 (L) 09/20/2019    Lab Results  Component Value Date   CEA1 2.61 01/02/2021   CEA 3.88 08/18/2023     Medications: I have reviewed the patient's current medications.   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), isolated mesenteric implant-resected, multiple tumor deposits Sigmoid/descending resection and creation of a descending colostomy 04/10/2016 MSI-stable, no loss of mismatch repair protein expression Foundation 1- BRAF V600E positive, MS-stable, intermediate tumor mutation burden, no RAS  mutation CT abdomen/pelvis 04/02/2016-no evidence of distant metastatic disease Cycle 1 FOLFOX 05/21/2016 Cycle 2 FOLFOX 06/04/2016 Cycle 3 FOLFOX 06/18/2016 Cycle 4 FOLFOX 07/02/2016 (oxaliplatin held secondary to thrombocytopenia) Cycle 5 FOLFOX 07/16/2016 Cycle 6 FOLFOX 07/30/2016 Cycle 7 FOLFOX 08/13/2016 (oxaliplatin held and 5-FU dose reduced) Cycle 8 FOLFOX 09/03/2016 (oxaliplatin held secondary to neuropathy) Cycle 9 FOLFOX 09/17/2016 (oxaliplatin held secondary to neuropathy) Cycle 10 FOLFOX 10/02/2016 Cycle 11 FOLFOX 10/15/2016 (oxaliplatin eliminated from the regimen) Cycle 12 FOLFOX 10/30/2016 (oxaliplatin eliminated) CTs 05/12/2017-no evidence of recurrent disease, right inguinal hernia containing bladder Colonoscopy 07/21/2017, 3 polyps were removed from the descending and transverse colon, fragments of tubular and tubulovillous adenoma CTs 05/19/2018-no evidence of recurrent disease, status post hernia repair, right upper lobe pneumonia CTs 05/20/2019-resolution of right upper lobe pneumonia, no evidence of metastatic disease Colonoscopy 01/25/2020-multiple polyps removed-tubular adenomas, poor preparation, repeat colonoscopy recommended CTs neck, chest, and abdomen/pelvis 08/27/2020- new 2 centimeter left axillary node, new 1.9 cm left inguinal node chronic mildly prominent portacaval node Ultrasound biopsy of left inguinal node on 10/29/2020-metastatic adenocarcinoma consistent with colon adenocarcinoma PET 11/12/2020-enlarged hypermetabolic left inguinal and left axillary nodes, solitary focus of hypermetabolic activity in the right lower quadrant small bowel Cycle 1 FOLFOX 12/19/2020 Cycle 2 FOLFOX 01/02/2021, oxaliplatin dose reduced secondary to neutropenia and thrombocytopenia Cycle 3 FOLFOX 01/16/2021 Cycle 4 FOLFOX 01/30/2021 CTs 02/11/2021-no change in left inguinal and left axillary nodes, no evidence of disease progression, stable wall thickening involving a loop  of distal  ileum Cycle 1 FOLFIRI/Avastin 02/27/2021 Cycle 2 FOLFIRI/Avastin 03/13/2021 Cycle 3 FOLFIRI/Avastin 04/03/2021 Cycle 4 FOLFIRI 04/17/2021, Avastin held due to elevated urine protein 05/07/2021-24-hour urine protein elevated 1562 Cycle 5 FOLFIRI 05/08/2021, Avastin held due to elevated urine protein, irinotecan dose reduced due to in general not feeling well after treatment CTs 05/17/2021-left axillary and left inguinal lymph nodes are smaller, no evidence of disease progression Cycle 6 FOLFIRI 05/22/2021, Avastin held due to proteinuria 06/03/2021 24-hour urine with 1.9 g of protein Cycle 7 FOLFIRI 06/12/2021, Avastin held due to proteinuria Cycle 8 FOLFIRI 07/03/2021, Avastin held due to proteinuria Cycle 9 FOLFIRI 07/24/2021, Avastin held due to proteinuria Cycle 10 FOLFIRI 08/19/2021, Avastin held due to proteinuria CTs 09/02/2021-no change in the left axillary, left inguinal, and portacaval lymph nodes.  No mention of the distal small bowel thickening Maintenance 5-fluorouracil 09/18/2021 CT abdomen/pelvis 10/28/2021-enlarged left inguinal lymph node, stable; stomach and small bowel grossly unremarkable. Maintenance 5-fluorouracil continued Colonoscopy 12/10/2021-terminal ileum with an ulcerated partially obstructing medium-sized mass approximately 15 to 20 cm from the IC valve, invasive adenocarcinoma, colonic type, moderately differentiated (grade 2) PET scan 12/18/2021-progression of disease in the peritoneal space.  2 intensely hypermetabolic nodes in the left axilla, one node is new from the prior, the other is reduced in size and metabolic activity; decrease in size and metabolic activity of left inguinal adenopathy; intense metabolic activity associate with a loop of small bowel in the right lower quadrant, no interval change; new small hypermetabolic peritoneal implant along the ventral peritoneal surface just right of midline; larger new peritoneal implant in the [name] right pelvis; small new implant in  the right mid abdomen Encorafenib/Panitumumab 01/08/2022 CTs 03/17/2022-decrease size of mesenteric implant at the posterior bladder wall, small mesenteric peritoneal lesions identified on PET/CT or not evident.  Decrease size of left inguinal left axillary nodes, hypermetabolic activity noted at the anterior left prostate on comparison August 2023 PET CTs 07/21/2022-decrease size of a left axillary lymph node and right pelvic implant.  Left inguinal lymph node measures smaller.  No new sites of disease identified.  Similar tiny pulmonary nodules. Encorafenib and Panitumumab continued, Panitumumab changed from a 2-week schedule to a 3-week schedule 07/23/2022 CT 10/30/2022-stable left axillary lymph node, no new or progressive metastatic disease in the chest; stable left inguinal lymphadenopathy; decrease in size of [name] right peritoneal implant; no new metastatic disease in the abdomen/pelvis. encorafenib and Panitumumab continued Panitumumab dose reduced 11/25/2022 due to skin rash CTs 02/20/2023-increased left inguinal adenopathy, developing left external iliac and ileocolic mesenteric adenopathy, unchanged left axillary node, enlarging pulmonary nodules minimal enlargement of cul-de-sac nodule Encorafenib and panitumumab continued Panitumumab every 2 weeks beginning 04/15/2023, continue encorafenib Restaging CTs 05/03/2023-new low-density liver lesions.  Progression of pelvic and less so abdominal nodal metastasis.  Mild progression of pulmonary metastasis.  Left axillary adenopathy improved. Cycle 1 Lonsurf 05/19/2023, Avastin every 2 weeks  Cycle 2 Lonsurf 06/23/2023, Avastin every 2 weeks 07/03/2023-24-hour urine with 7 g proteinuria Cycle 3 Lonsurf 07/22/2023, Avastin on hold due to proteinuria CTs 08/13/2023: Numerous bilateral pulmonary nodules (small, many were present on past CT), unchanged liver metastases, enlargement of left inguinal lymph node mass and left iliac/pelvic sidewall lymph nodes 2.    Chronic renal insufficiency   3.   Hypertension   4.  Inflamed sebaceous cyst at the upper back-status post incision and drainage   5.  Thrombocytopenia secondary chemotherapy-oxaliplatin dose reduced beginning with cycle 3 FOLFOX, oxaliplatin held with cycle 4 and cycle 7  FOLFOX   6.  Oxaliplatin neuropathy-improved   7.  History of Mucositis secondary chemotherapy   8.  Symptoms of pneumonia January 2020-CT 05/19/2018 consistent with right upper lobe pneumonia, Levaquin prescribed 05/20/2018   9.  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  Anemia, progressive 10/29/2021 2 units packed red blood cells 10/31/2021 Ferritin low 10/29/2021-oral iron Ferritin low 01/22/2022   12.  Colonoscopy 12/10/2021-terminal ileum with an ulcerated partially obstructing medium-sized mass approximately 15 to 20 cm from the IC valve, invasive adenocarcinoma, colonic type, moderately differentiated (grade 2) 13.  Admission 09/02/2022 with atrial flutter and rapid ventricular response 14.  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  History of atrial fibrillation/atrial flutter-apixaban discontinued 08/04/2023 secondary to bleeding from the left groin mass        Disposition: Louis [name] has metastatic colon cancer.  There is clinical and radiologic evidence of disease progression.  Has been treated with multiple systemic regimens.  He understands standard systemic treatment options are limited.  Lonsurf/bevacizumab will be discontinued.  24-hour urine protein return to greater than 6 g last month.  He has chronic renal insufficiency.  We discussed fruquintinib.  We also discussed repeat treatment with FOLFOX and FOLFIRI.  We discussed resuming encorafenib/panitumumab the addition of FOLFOX or FOLFIRI.  He reports he had nausea with FOLFIRI in the past.  I will consult with Dr. Cyrena Drilling and be in touch with Louis [name] over the next few days.  We decided to hold on  radiation to the left inguinal mass since the mass is no longer bleeding and is not painful.  I reviewed the restaging CT images and findings with Louis. Nunzio [name] and his wife.  Louis Deep, MD  08/18/2023  12:21 PM

## 2023-08-21 ENCOUNTER — Other Ambulatory Visit: Payer: Self-pay | Admitting: Oncology

## 2023-08-21 ENCOUNTER — Other Ambulatory Visit: Payer: Self-pay | Admitting: *Deleted

## 2023-08-21 MED ORDER — ENCORAFENIB 75 MG PO CAPS
300.0000 mg | ORAL_CAPSULE | Freq: Every day | ORAL | 0 refills | Status: DC
  Filled 2023-08-24: qty 120, 30d supply, fill #0

## 2023-08-21 MED ORDER — DOXYCYCLINE HYCLATE 100 MG PO TABS: 100.0000 mg | ORAL_TABLET | Freq: Two times a day (BID) | ORAL | 5 refills | Status: AC

## 2023-08-21 NOTE — Progress Notes (Signed)
 Dr. Scherrie Curt called [name] to discuss upcoming tx plan. Have decided on FOLFIRI + panitumumab  + encorafenib . Scheduling message sent, care plan entered. MD ordered encorafenib  and doxycycline  was ordered as well.

## 2023-08-21 NOTE — Progress Notes (Signed)
 DISCONTINUE OFF PATHWAY REGIMEN - Colorectal   [id number]:Bevacizumab 5 mg/kg IV D1,15 + Trifluridine/Tipiracil 35 mg/m2 PO BID D1-5, 8-12 q28 Days:   A cycle is every 28 days:     Trifluridine and tipiracil      Bevacizumab-xxxx   **Always confirm d

## 2023-08-24 ENCOUNTER — Other Ambulatory Visit: Payer: Self-pay | Admitting: *Deleted

## 2023-08-24 ENCOUNTER — Telehealth: Payer: Self-pay | Admitting: Pharmacy Technician

## 2023-08-24 ENCOUNTER — Other Ambulatory Visit (HOSPITAL_COMMUNITY): Payer: Self-pay

## 2023-08-24 ENCOUNTER — Telehealth: Payer: Self-pay | Admitting: Pharmacist

## 2023-08-24 ENCOUNTER — Other Ambulatory Visit: Payer: Self-pay | Admitting: Pharmacy Technician

## 2023-08-24 ENCOUNTER — Encounter: Payer: Self-pay | Admitting: Oncology

## 2023-08-24 ENCOUNTER — Other Ambulatory Visit: Payer: Self-pay

## 2023-08-24 DIAGNOSIS — C189 Malignant neoplasm of colon, unspecified: Secondary | ICD-10-CM

## 2023-08-24 NOTE — Progress Notes (Signed)
 Patient education documented in EPIC note on 08/24/23.

## 2023-08-24 NOTE — Telephone Encounter (Signed)
 Oral Oncology Patient Advocate Encounter   Received notification that prior authorization for Braftovi  is required.   PA submitted on 08/24/2023 Key [id number] Status is pending     Patty Benjaman Branch, CPhT Oncology Pharmacy Patient Advocate [facility] Direct Number: [phone] Fax: [phone]

## 2023-08-24 NOTE — Telephone Encounter (Signed)
 Clinical Pharmacist Practitioner Encounter   [facility] (Specialty) will deliver medication to the patient on 08/27/23. He knows not to start until 09/01/23 along with his other in office treatment.   Patient Education I spoke with patient for overview of new oral chemotherapy medication: Braftovi  (encorafenib ) for the treatment of progresive metastatic colon cancer, BRAF V600E positive, in conjunction with FOLFIRI and panitumumab , planned duration until disease progression or unacceptable drug toxicity. Planned start 09/01/23.   Counseled patient on administration, dosing, side effects, monitoring, drug-food interactions, safe handling, storage, and disposal. Patient will take 4 capsules (300 mg total) by mouth daily.   Side effects include but not limited to: diarrhea, acne-like rash, nausea, fatigue, decreased hgb.   Diarrhea: patient knows to use loperamide as needed and call the office if they are having four or more loose stool per day  Reviewed with patient importance of keeping a medication schedule and plan for any missed doses.  After discussion with patient no patient barriers to medication adherence identified.   [name] voiced understanding and appreciation. All questions answered. Medication handout provided.  Provided patient with Oral Chemotherapy Navigation Clinic phone number. Patient knows to call the office with questions or concerns. Oral Chemotherapy Navigation Clinic will continue to follow.  Lyndel Dancel N. Hendrick Pavich, PharmD, BCOP, CPP Hematology/Oncology Clinical Pharmacist [facility] Oral Chemotherapy Navigation Clinic [phone]  08/24/2023 3:25 PM

## 2023-08-24 NOTE — Telephone Encounter (Signed)
 Oral Oncology Patient Advocate Encounter  Prior Authorization for Braftovi  has been approved.    PA# [id number] Effective dates: 08/24/2023 through 08/23/2024  Patients co-pay is $0.    Thresa Floor, CPhT Oncology Pharmacy Patient Advocate [facility] Direct Number: [phone] Fax: [phone]

## 2023-08-24 NOTE — Telephone Encounter (Addendum)
 Clinical Pharmacist Practitioner Encounter   Received new prescription for Braftovi  (encorafenib ) for the treatment of progresive metastatic colon cancer, BRAF V600E positive, in conjunction with FOLFIRI and panitumumab , planned duration until disease progression or unacceptable drug toxicity. Planned start 09/01/23.   CMP from 08/18/23 assessed, no relevant lab abnormalities. Prescription dose and frequency assessed.   Current medication list in Epic reviewed, one DDIs with encorafenib  identified: Amlodipine : encorafenib  may decrease the serum concentration of amlodipine . Monitor for loss of blood pressure control. No baseline dose adjustment needed.   Evaluated chart and no patient barriers to medication adherence identified.   Prescription has been e-scribed to the [facility] for benefits analysis and approval.  Oral Oncology Clinic will continue to follow for insurance authorization, copayment issues, initial counseling and start date.   Louis [name], PharmD, BCOP, CPP Hematology/Oncology Clinical Pharmacist [facility] Oral Chemotherapy Navigation Clinic [phone]  08/24/2023 9:11 AM

## 2023-08-24 NOTE — Progress Notes (Signed)
 Specialty Pharmacy Initial Fill Coordination Note  Louis [name]. is a 78 y.o. male contacted today regarding refills of specialty medication(s) Encorafenib  (BRAFTOVI ) .  Patient requested Delivery  on 08/27/23  to verified address [address] [place] [address]   Medication will be filled on 04/23.   Patient is aware of $0 copayment.    Patty Benjaman Branch, CPhT Oncology Pharmacy Patient Advocate [facility] Direct Number: [phone] Fax: [phone]

## 2023-08-25 ENCOUNTER — Other Ambulatory Visit: Payer: Self-pay | Admitting: Oncology

## 2023-08-25 DIAGNOSIS — C189 Malignant neoplasm of colon, unspecified: Secondary | ICD-10-CM

## 2023-08-25 NOTE — Progress Notes (Signed)
 Pharmacist Chemotherapy Monitoring - Initial Assessment    Anticipated start date: 09/01/23   The following has been reviewed per standard work regarding the patient's treatment regimen: The patient's diagnosis, treatment plan and drug doses, and organ/hematologic function Lab orders and baseline tests specific to treatment regimen  The treatment plan start date, drug sequencing, and pre-medications Prior authorization status  Patient's documented medication list, including drug-drug interaction screen and prescriptions for anti-emetics and supportive care specific to the treatment regimen The drug concentrations, fluid compatibility, administration routes, and timing of the medications to be used The patient's access for treatment and lifetime cumulative dose history, if applicable  The patient's medication allergies and previous infusion related reactions, if applicable   Changes made to treatment plan:  N/A  Follow up needed:  Pending authorization for treatment    Louis [name], RPH, 08/25/2023  8:04 AM

## 2023-08-26 ENCOUNTER — Other Ambulatory Visit: Payer: Self-pay

## 2023-08-27 ENCOUNTER — Other Ambulatory Visit: Payer: Self-pay

## 2023-08-31 ENCOUNTER — Other Ambulatory Visit: Payer: Self-pay

## 2023-09-01 ENCOUNTER — Encounter: Payer: Self-pay | Admitting: Nurse Practitioner

## 2023-09-01 ENCOUNTER — Inpatient Hospital Stay

## 2023-09-01 ENCOUNTER — Inpatient Hospital Stay (HOSPITAL_BASED_OUTPATIENT_CLINIC_OR_DEPARTMENT_OTHER): Admitting: Nurse Practitioner

## 2023-09-01 VITALS — BP 155/80 | HR 60 | Temp 98.2°F | Resp 18

## 2023-09-01 VITALS — BP 154/78 | HR 63 | Temp 98.6°F | Resp 18 | Ht 76.0 in | Wt 220.4 lb

## 2023-09-01 DIAGNOSIS — C187 Malignant neoplasm of sigmoid colon: Secondary | ICD-10-CM | POA: Diagnosis not present

## 2023-09-01 DIAGNOSIS — C189 Malignant neoplasm of colon, unspecified: Secondary | ICD-10-CM

## 2023-09-01 DIAGNOSIS — Z5112 Encounter for antineoplastic immunotherapy: Secondary | ICD-10-CM | POA: Diagnosis not present

## 2023-09-01 DIAGNOSIS — N189 Chronic kidney disease, unspecified: Secondary | ICD-10-CM | POA: Diagnosis not present

## 2023-09-01 DIAGNOSIS — C774 Secondary and unspecified malignant neoplasm of inguinal and lower limb lymph nodes: Secondary | ICD-10-CM | POA: Diagnosis not present

## 2023-09-01 DIAGNOSIS — C787 Secondary malignant neoplasm of liver and intrahepatic bile duct: Secondary | ICD-10-CM | POA: Diagnosis not present

## 2023-09-01 DIAGNOSIS — Z95828 Presence of other vascular implants and grafts: Secondary | ICD-10-CM

## 2023-09-01 DIAGNOSIS — Z5111 Encounter for antineoplastic chemotherapy: Secondary | ICD-10-CM | POA: Diagnosis not present

## 2023-09-01 LAB — CBC WITH DIFFERENTIAL (CANCER CENTER ONLY)
Abs Immature Granulocytes: 0.01 10*3/uL (ref 0.00–0.07)
Basophils Absolute: 0 10*3/uL (ref 0.0–0.1)
Basophils Relative: 1 %
Eosinophils Absolute: 0.1 10*3/uL (ref 0.0–0.5)
Eosinophils Relative: 2 %
HCT: 36.5 % — ABNORMAL LOW (ref 39.0–52.0)
Hemoglobin: 11.7 g/dL — ABNORMAL LOW (ref 13.0–17.0)
Immature Granulocytes: 0 %
Lymphocytes Relative: 15 %
Lymphs Abs: 0.8 10*3/uL (ref 0.7–4.0)
MCH: 31.4 pg (ref 26.0–34.0)
MCHC: 32.1 g/dL (ref 30.0–36.0)
MCV: 97.9 fL (ref 80.0–100.0)
Monocytes Absolute: 0.5 10*3/uL (ref 0.1–1.0)
Monocytes Relative: 9 %
Neutro Abs: 4 10*3/uL (ref 1.7–7.7)
Neutrophils Relative %: 73 %
Platelet Count: 120 10*3/uL — ABNORMAL LOW (ref 150–400)
RBC: 3.73 MIL/uL — ABNORMAL LOW (ref 4.22–5.81)
RDW: 18 % — ABNORMAL HIGH (ref 11.5–15.5)
WBC Count: 5.5 10*3/uL (ref 4.0–10.5)
nRBC: 0 % (ref 0.0–0.2)

## 2023-09-01 LAB — CMP (CANCER CENTER ONLY)
ALT: 6 U/L (ref 0–44)
AST: 16 U/L (ref 15–41)
Albumin: 3.8 g/dL (ref 3.5–5.0)
Alkaline Phosphatase: 144 U/L — ABNORMAL HIGH (ref 38–126)
Anion gap: 10 (ref 5–15)
BUN: 34 mg/dL — ABNORMAL HIGH (ref 8–23)
CO2: 18 mmol/L — ABNORMAL LOW (ref 22–32)
Calcium: 8.7 mg/dL — ABNORMAL LOW (ref 8.9–10.3)
Chloride: 108 mmol/L (ref 98–111)
Creatinine: 2.68 mg/dL — ABNORMAL HIGH (ref 0.61–1.24)
GFR, Estimated: 24 mL/min — ABNORMAL LOW (ref >60)
Glucose, Bld: 148 mg/dL — ABNORMAL HIGH (ref 70–99)
Potassium: 4.9 mmol/L (ref 3.5–5.1)
Sodium: 136 mmol/L (ref 135–145)
Total Bilirubin: 0.2 mg/dL (ref 0.0–1.2)
Total Protein: 6.5 g/dL (ref 6.5–8.1)

## 2023-09-01 LAB — CEA (ACCESS): CEA (CHCC): 5.2 ng/mL — ABNORMAL HIGH (ref 0.00–5.00)

## 2023-09-01 LAB — MAGNESIUM: Magnesium: 2.3 mg/dL (ref 1.7–2.4)

## 2023-09-01 MED ORDER — PALONOSETRON HCL INJECTION 0.25 MG/5ML
0.2500 mg | Freq: Once | INTRAVENOUS | Status: AC
  Administered 2023-09-01: 0.25 mg via INTRAVENOUS
  Filled 2023-09-01: qty 5

## 2023-09-01 MED ORDER — FLUOROURACIL CHEMO INJECTION 2.5 GM/50ML
300.0000 mg/m2 | Freq: Once | INTRAVENOUS | Status: DC
  Filled 2023-09-01: qty 14

## 2023-09-01 MED ORDER — DEXAMETHASONE SODIUM PHOSPHATE 10 MG/ML IJ SOLN
10.0000 mg | Freq: Once | INTRAMUSCULAR | Status: AC
Start: 2023-09-01 — End: 2023-09-01
  Administered 2023-09-01: 10 mg via INTRAVENOUS
  Filled 2023-09-01: qty 1

## 2023-09-01 MED ORDER — SODIUM CHLORIDE 0.9 % IV SOLN
1800.0000 mg/m2 | INTRAVENOUS | Status: DC
  Filled 2023-09-01: qty 83

## 2023-09-01 MED ORDER — SODIUM CHLORIDE 0.9 % IV SOLN
150.0000 mg | Freq: Once | INTRAVENOUS | Status: AC
  Administered 2023-09-01: 150 mg via INTRAVENOUS
  Filled 2023-09-01: qty 5

## 2023-09-01 MED ORDER — ATROPINE SULFATE 1 MG/ML IV SOLN
0.5000 mg | Freq: Once | INTRAVENOUS | Status: AC | PRN
  Administered 2023-09-01: 0.5 mg via INTRAVENOUS
  Filled 2023-09-01: qty 1

## 2023-09-01 MED ORDER — SODIUM CHLORIDE 0.9 % IV SOLN
300.0000 mg/m2 | Freq: Once | INTRAVENOUS | Status: AC
  Administered 2023-09-01: 694 mg via INTRAVENOUS
  Filled 2023-09-01: qty 34.7

## 2023-09-01 MED ORDER — SODIUM CHLORIDE 0.9 % IV SOLN
120.0000 mg/m2 | Freq: Once | INTRAVENOUS | Status: AC
  Administered 2023-09-01: 300 mg via INTRAVENOUS
  Filled 2023-09-01: qty 15

## 2023-09-01 MED ORDER — SODIUM CHLORIDE 0.9 % IV SOLN: INTRAVENOUS | Status: DC

## 2023-09-01 MED ORDER — SODIUM CHLORIDE 0.9 % IV SOLN
4.0000 mg/kg | Freq: Once | INTRAVENOUS | Status: AC
  Administered 2023-09-01: 400 mg via INTRAVENOUS
  Filled 2023-09-01: qty 20

## 2023-09-01 MED ORDER — SODIUM CHLORIDE 0.9% FLUSH
10.0000 mL | INTRAVENOUS | Status: DC | PRN
  Administered 2023-09-01: 10 mL via INTRAVENOUS

## 2023-09-01 NOTE — Progress Notes (Signed)
 [facility] OFFICE PROGRESS NOTE   Diagnosis: Colon cancer   INTERVAL HISTORY:   [name] returns as scheduled.  He is scheduled to begin treatment today with FOLFIRI plus panitumumab  and encorafenib .  His wife and daughter are present for today's visit.  Overall is feeling better.  He thinks the left inguinal mass is larger.  1 episode of minor bleeding from the mass.  Objective:  Vital signs in last 24 hours:  Blood pressure (!) 154/78, pulse 63, temperature 98.6 F (37 C), temperature source Temporal, resp. rate 18, height 6\' 4"  (1.93 m), weight 220 lb 6.4 oz (100 kg), SpO2 99%.    HEENT: No thrush or ulcers. Lymphatics: Palpable 1 cm axillary nodes.  Approximate 10 cm left inguinal mass with fungating 2 cm area. Resp: Lungs clear bilaterally. Cardio: Regular rate and rhythm. GI: No hepatosplenomegaly. Vascular: No leg edema. Port-A-Cath without erythema.  Lab Results:  Lab Results  Component Value Date   WBC 5.5 09/01/2023   HGB 11.7 (L) 09/01/2023   HCT 36.5 (L) 09/01/2023   MCV 97.9 09/01/2023   PLT 120 (L) 09/01/2023   NEUTROABS 4.0 09/01/2023    Imaging:  No results found.  Medications: I have reviewed the patient's current medications.  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), isolated mesenteric implant-resected, multiple tumor deposits Sigmoid/descending resection and creation of a descending colostomy 04/10/2016 MSI-stable, no loss of mismatch repair protein expression Foundation 1- BRAF V600E positive, MS-stable, intermediate tumor mutation burden, no RAS mutation CT abdomen/pelvis 04/02/2016-no evidence of distant metastatic disease Cycle 1 FOLFOX 05/21/2016 Cycle 2 FOLFOX 06/04/2016 Cycle 3 FOLFOX 06/18/2016 Cycle 4 FOLFOX 07/02/2016 (oxaliplatin  held secondary to thrombocytopenia) Cycle 5 FOLFOX 07/16/2016 Cycle 6 FOLFOX 07/30/2016 Cycle 7 FOLFOX 08/13/2016 (oxaliplatin  held and 5-FU dose reduced) Cycle 8  FOLFOX 09/03/2016 (oxaliplatin  held secondary to neuropathy) Cycle 9 FOLFOX 09/17/2016 (oxaliplatin  held secondary to neuropathy) Cycle 10 FOLFOX 10/02/2016 Cycle 11 FOLFOX 10/15/2016 (oxaliplatin  eliminated from the regimen) Cycle 12 FOLFOX 10/30/2016 (oxaliplatin  eliminated) CTs 05/12/2017-no evidence of recurrent disease, right inguinal hernia containing bladder Colonoscopy 07/21/2017, 3 polyps were removed from the descending and transverse colon, fragments of tubular and tubulovillous adenoma CTs 05/19/2018-no evidence of recurrent disease, status post hernia repair, right upper lobe pneumonia CTs 05/20/2019-resolution of right upper lobe pneumonia, no evidence of metastatic disease Colonoscopy 01/25/2020-multiple polyps removed-tubular adenomas, poor preparation, repeat colonoscopy recommended CTs neck, chest, and abdomen/pelvis 08/27/2020- new 2 centimeter left axillary node, new 1.9 cm left inguinal node chronic mildly prominent portacaval node Ultrasound biopsy of left inguinal node on 10/29/2020-metastatic adenocarcinoma consistent with colon adenocarcinoma PET 11/12/2020-enlarged hypermetabolic left inguinal and left axillary nodes, solitary focus of hypermetabolic activity in the right lower quadrant small bowel Cycle 1 FOLFOX 12/19/2020 Cycle 2 FOLFOX 01/02/2021, oxaliplatin  dose reduced secondary to neutropenia and thrombocytopenia Cycle 3 FOLFOX 01/16/2021 Cycle 4 FOLFOX 01/30/2021 CTs 02/11/2021-no change in left inguinal and left axillary nodes, no evidence of disease progression, stable wall thickening involving a loop of distal ileum Cycle 1 FOLFIRI/Avastin  02/27/2021 Cycle 2 FOLFIRI/Avastin  03/13/2021 Cycle 3 FOLFIRI/Avastin  04/03/2021 Cycle 4 FOLFIRI 04/17/2021, Avastin  held due to elevated urine protein 05/07/2021-24-hour urine protein elevated 1562 Cycle 5 FOLFIRI 05/08/2021, Avastin  held due to elevated urine protein, irinotecan  dose reduced due to in general not feeling well after  treatment CTs 05/17/2021-left axillary and left inguinal lymph nodes are smaller, no evidence of disease progression Cycle 6 FOLFIRI 05/22/2021, Avastin  held due to proteinuria 06/03/2021 24-hour urine with 1.9 g of protein Cycle  7 FOLFIRI 06/12/2021, Avastin  held due to proteinuria Cycle 8 FOLFIRI 07/03/2021, Avastin  held due to proteinuria Cycle 9 FOLFIRI 07/24/2021, Avastin  held due to proteinuria Cycle 10 FOLFIRI 08/19/2021, Avastin  held due to proteinuria CTs 09/02/2021-no change in the left axillary, left inguinal, and portacaval lymph nodes.  No mention of the distal small bowel thickening Maintenance 5-fluorouracil  09/18/2021 CT abdomen/pelvis 10/28/2021-enlarged left inguinal lymph node, stable; stomach and small bowel grossly unremarkable. Maintenance 5-fluorouracil  continued Colonoscopy 12/10/2021-terminal ileum with an ulcerated partially obstructing medium-sized mass approximately 15 to 20 cm from the IC valve, invasive adenocarcinoma, colonic type, moderately differentiated (grade 2) PET scan 12/18/2021-progression of disease in the peritoneal space.  2 intensely hypermetabolic nodes in the left axilla, one node is new from the prior, the other is reduced in size and metabolic activity; decrease in size and metabolic activity of left inguinal adenopathy; intense metabolic activity associate with a loop of small bowel in the right lower quadrant, no interval change; new small hypermetabolic peritoneal implant along the ventral peritoneal surface just right of midline; larger new peritoneal implant in the deep right pelvis; small new implant in the right mid abdomen Encorafenib /Panitumumab  01/08/2022 CTs 03/17/2022-decrease size of mesenteric implant at the posterior bladder wall, small mesenteric peritoneal lesions identified on PET/CT or not evident.  Decrease size of left inguinal left axillary nodes, hypermetabolic activity noted at the anterior left prostate on comparison August 2023 PET CTs  07/21/2022-decrease size of a left axillary lymph node and right pelvic implant.  Left inguinal lymph node measures smaller.  No new sites of disease identified.  Similar tiny pulmonary nodules. Encorafenib  and Panitumumab  continued, Panitumumab  changed from a 2-week schedule to a 3-week schedule 07/23/2022 CT 10/30/2022-stable left axillary lymph node, no new or progressive metastatic disease in the chest; stable left inguinal lymphadenopathy; decrease in size of deep right peritoneal implant; no new metastatic disease in the abdomen/pelvis. encorafenib  and Panitumumab  continued Panitumumab  dose reduced 11/25/2022 due to skin rash CTs 02/20/2023-increased left inguinal adenopathy, developing left external iliac and ileocolic mesenteric adenopathy, unchanged left axillary node, enlarging pulmonary nodules minimal enlargement of cul-de-sac nodule Encorafenib  and panitumumab  continued Panitumumab  every 2 weeks beginning 04/15/2023, continue encorafenib  Restaging CTs 05/03/2023-new low-density liver lesions.  Progression of pelvic and less so abdominal nodal metastasis.  Mild progression of pulmonary metastasis.  Left axillary adenopathy improved. Cycle 1 Lonsurf  05/19/2023, Avastin  every 2 weeks  Cycle 2 Lonsurf  06/23/2023, Avastin  every 2 weeks 07/03/2023-24-hour urine with 7 g proteinuria Cycle 3 Lonsurf  07/22/2023, Avastin  on hold due to proteinuria CTs 08/13/2023: Numerous bilateral pulmonary nodules (small, many were present on past CT), unchanged liver metastases, enlargement of left inguinal lymph node mass and left iliac/pelvic sidewall lymph nodes Cycle 1 FOLFIRI/panitumumab /encorafenib  09/01/2023 2.   Chronic renal insufficiency   3.   Hypertension   4.  Inflamed sebaceous cyst at the upper back-status post incision and drainage   5.  Thrombocytopenia secondary chemotherapy-oxaliplatin  dose reduced beginning with cycle 3 FOLFOX, oxaliplatin  held with cycle 4 and cycle 7 FOLFOX   6.  Oxaliplatin   neuropathy-improved   7.  History of Mucositis secondary chemotherapy   8.  Symptoms of pneumonia January 2020-CT 05/19/2018 consistent with right upper lobe pneumonia, Levaquin  prescribed 05/20/2018   9.  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  Anemia, progressive 10/29/2021 2 units packed red blood cells 10/31/2021 Ferritin low 10/29/2021-oral iron Ferritin low 01/22/2022   12.  Colonoscopy 12/10/2021-terminal ileum with an  ulcerated partially obstructing medium-sized mass approximately 15 to 20 cm from the IC valve, invasive adenocarcinoma, colonic type, moderately differentiated (grade 2) 13.  Admission 09/02/2022 with atrial flutter and rapid ventricular response 14.  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  History of atrial fibrillation/atrial flutter-apixaban  discontinued 08/04/2023 secondary to bleeding from the left groin mass    Disposition: [name] appears stable.  He is scheduled to begin treatment today with FOLFIRI plus Panitumumab  and encorafenib .  We again reviewed potential toxicities.  He and his family understand goals of treatment including potential improvement in the cancer including the left groin mass and potential prolongation of life.  They understand no therapy will be curative.  We also discussed fruquintinib, palliative radiation to the inguinal mass and supportive/comfort care with a hospice referral.  [name] would like to proceed with FOLFIRI plus Panitumumab  and encorafenib .  Plan for cycle 1 today.  CBC and chemistry panel reviewed.  Labs adequate for treatment.  Creatinine is stable.  He will return for follow-up and treatment in 2 weeks.  We are available to see him sooner if needed.  Patient seen with Dr. Scherrie Curt.    Diana Forster ANP/GNP-BC   09/01/2023  10:00 AM  Addendum 4:08 PM-at 2:47 PM the infusion nurse requested I speak to [name] regarding his treatment.  I talked with him in the infusion  area.  He had completed Panitumumab , leucovorin  and irinotecan .  He stated he was beginning to feel like he did with prior treatments and has decided he does not want to proceed.  He indicated he was experiencing nausea.  I offered an antiemetic.  He declined specific intervention at this time.  He decided against the 5-FU bolus and pump.  He plans to take encorafenib  for this cycle.  He will return in 2 weeks for additional discussion.  He will contact the office in the interim with any problems.   This was a shared visit with Diana Forster.  We had a discussion with [name] regarding the indication for proceeding with systemic therapy versus comfort care.  We also discussed palliative radiation to the left groin mass.  He agreed to proceed with FOLFIRI/panitumumab /encorafenib .  I discussed this regimen with him by telephone after I saw him 2 weeks ago.  We again reviewed potential toxicities associated with this regimen.  He understands the combination of FOLFIRI and panitumumab /encorafenib  has not been tested in clinical trials, but there is recent data supporting the use of FOLFOX with panitumumab /encorafenib .  He understands no therapy will be curative.  The goals of treatment are to palliate symptoms and potentially prolong survival.  We also discussed fruquintinib.  He decided to proceed with treatment today.  Near the completion of the irinotecan /leucovorin  infusion he decided to discontinue treatment.  He did not receive 5-fluorouracil .  He plans to begin encorafenib .  He will return for an office visit and further discussion in 2 weeks.  I was present for greater than 50% of today's visit.  I performed medical decision making.  Anise Kerns, MD

## 2023-09-01 NOTE — Progress Notes (Signed)
 Patient presents today for chemotherapy/immunotherapy infusion of Vectibix , Irinotecan , Leucovorin , and Adrucil . Patient is in satisfactory condition with no new complaints voiced.  Vital signs are stable.  Labs reviewed by Diana Forster NP during the office visit and all labs are within treatment parameters with exception of creatinine 2.68. Per Diana Forster NP okay to treat. Patient has had same chemo treatment in past but is restarting today as a palliative care, new consent signed. Patient and family had long discussion with Diana Forster NP today prior to treatment. We will proceed with treatment per NP/MD orders.   Patient received Vectibix , Leucovorin , and Irinotecan .  Patient refusing the Adrucil  push and pump and states "I am starting to feel crummy just like I did 6 months ago and I don't want to do this again." Patient wants to stop treatment altogether. Diana Forster NP and Dr Coni Deep aware of patient's refusal and request to stop treatment.   Both Diana Forster NP and Dr Coni Deep talked with patient. Patient still deciding to not finish treatment. Patient to follow-up in two weeks.   Patient left ambulatory in stable condition.  Vital signs stable at discharge.  Follow up as scheduled.

## 2023-09-01 NOTE — Patient Instructions (Signed)
 [facility] - A DEPT OF [facility]  Discharge Instructions: Thank you for choosing [facility] to provide your oncology and hematology care.   If you have a lab appointment with the Cancer Center, please go directly to the Cancer Center and check in at the registration area.   Wear comfortable clothing and clothing appropriate for easy access to any Portacath or PICC line.   We strive to give you quality time with your provider. You may need to reschedule your appointment if you arrive late (15 or more minutes).  Arriving late affects you and other patients whose appointments are after yours.  Also, if you miss three or more appointments without notifying the office, you may be dismissed from the clinic at the provider's discretion.      For prescription refill requests, have your pharmacy contact our office and allow 72 hours for refills to be completed.    Today you received the following chemotherapy and/or immunotherapy agents Vectibix , Leucovorin , and Irinotecan .      Panitumumab  Injection What is this medication? PANITUMUMAB  (pan i TOOM ue mab) treats colorectal cancer. It works by blocking a protein that causes cancer cells to grow and multiply. This helps to slow or stop the spread of cancer cells. It is a monoclonal antibody. This medicine may be used for other purposes; ask your health care provider or pharmacist if you have questions. COMMON BRAND NAME(S): Vectibix  What should I tell my care team before I take this medication? They need to know if you have any of these conditions: Eye disease Low levels of magnesium  in the blood Lung disease An unusual or allergic reaction to panitumumab , other medications, foods, dyes, or preservatives Pregnant or trying to get pregnant Breast-feeding How should I use this medication? This medication is injected into a vein. It is given by your care team in a hospital or clinic setting. Talk to  your care team about the use of this medication in children. Special care may be needed. Overdosage: If you think you have taken too much of this medicine contact a poison control center or emergency room at once. NOTE: This medicine is only for you. Do not share this medicine with others. What if I miss a dose? Keep appointments for follow-up doses. It is important not to miss your dose. Call your care team if you are unable to keep an appointment. What may interact with this medication? Bevacizumab  This list may not describe all possible interactions. Give your health care provider a list of all the medicines, herbs, non-prescription drugs, or dietary supplements you use. Also tell them if you smoke, drink alcohol, or use illegal drugs. Some items may interact with your medicine. What should I watch for while using this medication? Your condition will be monitored carefully while you are receiving this medication. This medication may make you feel generally unwell. This is not uncommon as chemotherapy can affect healthy cells as well as cancer cells. Report any side effects. Continue your course of treatment even though you feel ill unless your care team tells you to stop. This medication can make you more sensitive to the sun. Keep out of the sun while receiving this medication and for 2 months after stopping therapy. If you cannot avoid being in the sun, wear protective clothing and sunscreen. Do not use sun lamps, tanning beds, or tanning booths. Check with your care team if you have severe diarrhea, nausea, and vomiting or if  you sweat a lot. The loss of too much body fluid may make it dangerous for you to take this medication. This medication may cause serious skin reactions. They can happen weeks to months after starting the medication. Contact your care team right away if you notice fevers or flu-like symptoms with a rash. The rash may be red or purple and then turn into blisters or peeling of  the skin. You may also notice a red rash with swelling of the face, lips, or lymph nodes in your neck or under your arms. Talk to your care team if you may be pregnant. Serious birth defects can occur if you take this medication during pregnancy and for 2 months after the last dose. Contraception is recommended while taking this medication and for 2 months after the last dose. Your care team can help you find the option that works for you. Do not breastfeed while taking this medication and for 2 months after the last dose. This medication may cause infertility. Talk to your care team if you are concerned about your fertility. What side effects may I notice from receiving this medication? Side effects that you should report to your care team as soon as possible: Allergic reactions--skin rash, itching, hives, swelling of the face, lips, tongue, or throat Dry cough, shortness of breath or trouble breathing Eye pain, redness, irritation, or discharge with blurry or decreased vision Infusion reactions--chest pain, shortness of breath or trouble breathing, feeling faint or lightheaded Low magnesium  level--muscle pain or cramps, unusual weakness or fatigue, fast or irregular heartbeat, tremors Low potassium level--muscle pain or cramps, unusual weakness or fatigue, fast or irregular heartbeat, constipation Redness, blistering, peeling, or loosening of the skin, including inside the mouth Skin reactions on sun-exposed areas Side effects that usually do not require medical attention (report to your care team if they continue or are bothersome): Change in nail shape, thickness, or color Diarrhea Dry skin Fatigue Nausea Vomiting This list may not describe all possible side effects. Call your doctor for medical advice about side effects. You may report side effects to FDA at 1-800-FDA-1088. Where should I keep my medication? This medication is given in a hospital or clinic. It will not be stored at  home. NOTE: This sheet is a summary. It may not cover all possible information. If you have questions about this medicine, talk to your doctor, pharmacist, or health care provider.  2024 Elsevier/Gold Standard (2021-09-04 00:00:00)  Leucovorin  Injection What is this medication? LEUCOVORIN  (loo koe VOR in) prevents side effects from certain medications, such as methotrexate. It works by increasing folate levels. This helps protect healthy cells in your body. It may also be used to treat anemia caused by low levels of folate. It can also be used with fluorouracil , a type of chemotherapy, to treat colorectal cancer. It works by increasing the effects of fluorouracil  in the body. This medicine may be used for other purposes; ask your health care provider or pharmacist if you have questions. What should I tell my care team before I take this medication? They need to know if you have any of these conditions: Anemia from low levels of vitamin B12 in the blood An unusual or allergic reaction to leucovorin , folic acid, other medications, foods, dyes, or preservatives Pregnant or trying to get pregnant Breastfeeding How should I use this medication? This medication is injected into a vein or a muscle. It is given by your care team in a hospital or clinic setting. Talk to your  care team about the use of this medication in children. Special care may be needed. Overdosage: If you think you have taken too much of this medicine contact a poison control center or emergency room at once. NOTE: This medicine is only for you. Do not share this medicine with others. What if I miss a dose? Keep appointments for follow-up doses. It is important not to miss your dose. Call your care team if you are unable to keep an appointment. What may interact with this medication? Capecitabine Fluorouracil  Phenobarbital Phenytoin Primidone Trimethoprim ;sulfamethoxazole  This list may not describe all possible interactions.  Give your health care provider a list of all the medicines, herbs, non-prescription drugs, or dietary supplements you use. Also tell them if you smoke, drink alcohol, or use illegal drugs. Some items may interact with your medicine. What should I watch for while using this medication? Your condition will be monitored carefully while you are receiving this medication. This medication may increase the side effects of 5-fluorouracil . Tell your care team if you have diarrhea or mouth sores that do not get better or that get worse. What side effects may I notice from receiving this medication? Side effects that you should report to your care team as soon as possible: Allergic reactions--skin rash, itching, hives, swelling of the face, lips, tongue, or throat This list may not describe all possible side effects. Call your doctor for medical advice about side effects. You may report side effects to FDA at 1-800-FDA-1088. Where should I keep my medication? This medication is given in a hospital or clinic. It will not be stored at home. NOTE: This sheet is a summary. It may not cover all possible information. If you have questions about this medicine, talk to your doctor, pharmacist, or health care provider.  2024 Elsevier/Gold Standard (2021-09-24 00:00:00)  Irinotecan  Injection What is this medication? IRINOTECAN  (ir in oh TEE kan) treats some types of cancer. It works by slowing down the growth of cancer cells. This medicine may be used for other purposes; ask your health care provider or pharmacist if you have questions. COMMON BRAND NAME(S): Camptosar  What should I tell my care team before I take this medication? They need to know if you have any of these conditions: Dehydration Diarrhea Infection, especially a viral infection, such as chickenpox, cold sores, herpes Liver disease Low blood cell levels (white cells, red cells, and platelets) Low levels of electrolytes, such as calcium ,  magnesium , or potassium in your blood Recent or ongoing radiation An unusual or allergic reaction to irinotecan , other medications, foods, dyes, or preservatives If you or your partner are pregnant or trying to get pregnant Breast-feeding How should I use this medication? This medication is injected into a vein. It is given by your care team in a hospital or clinic setting. Talk to your care team about the use of this medication in children. Special care may be needed. Overdosage: If you think you have taken too much of this medicine contact a poison control center or emergency room at once. NOTE: This medicine is only for you. Do not share this medicine with others. What if I miss a dose? Keep appointments for follow-up doses. It is important not to miss your dose. Call your care team if you are unable to keep an appointment. What may interact with this medication? Do not take this medication with any of the following: Cobicistat Itraconazole This medication may also interact with the following: Certain antibiotics, such as clarithromycin, rifampin, rifabutin Certain  antivirals for HIV or AIDS Certain medications for fungal infections, such as ketoconazole, posaconazole, voriconazole Certain medications for seizures, such as carbamazepine, phenobarbital, phenytoin Gemfibrozil Nefazodone St. John's wort This list may not describe all possible interactions. Give your health care provider a list of all the medicines, herbs, non-prescription drugs, or dietary supplements you use. Also tell them if you smoke, drink alcohol, or use illegal drugs. Some items may interact with your medicine. What should I watch for while using this medication? Your condition will be monitored carefully while you are receiving this medication. You may need blood work while taking this medication. This medication may make you feel generally unwell. This is not uncommon as chemotherapy can affect healthy cells as  well as cancer cells. Report any side effects. Continue your course of treatment even though you feel ill unless your care team tells you to stop. This medication can cause serious side effects. To reduce the risk, your care team may give you other medications to take before receiving this one. Be sure to follow the directions from your care team. This medication may affect your coordination, reaction time, or judgement. Do not drive or operate machinery until you know how this medication affects you. Sit up or stand slowly to reduce the risk of dizzy or fainting spells. Drinking alcohol with this medication can increase the risk of these side effects. This medication may increase your risk of getting an infection. Call your care team for advice if you get a fever, chills, sore throat, or other symptoms of a cold or flu. Do not treat yourself. Try to avoid being around people who are sick. Avoid taking medications that contain aspirin , acetaminophen , ibuprofen, naproxen, or ketoprofen unless instructed by your care team. These medications may hide a fever. This medication may increase your risk to bruise or bleed. Call your care team if you notice any unusual bleeding. Be careful brushing or flossing your teeth or using a toothpick because you may get an infection or bleed more easily. If you have any dental work done, tell your dentist you are receiving this medication. Talk to your care team if you or your partner are pregnant or think either of you might be pregnant. This medication can cause serious birth defects if taken during pregnancy and for 6 months after the last dose. You will need a negative pregnancy test before starting this medication. Contraception is recommended while taking this medication and for 6 months after the last dose. Your care team can help you find the option that works for you. Do not father a child while taking this medication and for 3 months after the last dose. Use a condom  for contraception during this time period. Do not breastfeed while taking this medication and for 7 days after the last dose. This medication may cause infertility. Talk to your care team if you are concerned about your fertility. What side effects may I notice from receiving this medication? Side effects that you should report to your care team as soon as possible: Allergic reactions--skin rash, itching, hives, swelling of the face, lips, tongue, or throat Dry cough, shortness of breath or trouble breathing Increased saliva or tears, increased sweating, stomach cramping, diarrhea, small pupils, unusual weakness or fatigue, slow heartbeat Infection--fever, chills, cough, sore throat, wounds that don't heal, pain or trouble when passing urine, general feeling of discomfort or being unwell Kidney injury--decrease in the amount of urine, swelling of the ankles, hands, or feet Low red blood cell  level--unusual weakness or fatigue, dizziness, headache, trouble breathing Severe or prolonged diarrhea Unusual bruising or bleeding Side effects that usually do not require medical attention (report to your care team if they continue or are bothersome): Constipation Diarrhea Hair loss Loss of appetite Nausea Stomach pain This list may not describe all possible side effects. Call your doctor for medical advice about side effects. You may report side effects to FDA at 1-800-FDA-1088. Where should I keep my medication? This medication is given in a hospital or clinic. It will not be stored at home. NOTE: This sheet is a summary. It may not cover all possible information. If you have questions about this medicine, talk to your doctor, pharmacist, or health care provider.  2024 Elsevier/Gold Standard (2021-09-02 00:00:00)   To help prevent nausea and vomiting after your treatment, we encourage you to take your nausea medication as directed.  BELOW ARE SYMPTOMS THAT SHOULD BE REPORTED IMMEDIATELY: *FEVER  GREATER THAN 100.4 F (38 C) OR HIGHER *CHILLS OR SWEATING *NAUSEA AND VOMITING THAT IS NOT CONTROLLED WITH YOUR NAUSEA MEDICATION *UNUSUAL SHORTNESS OF BREATH *UNUSUAL BRUISING OR BLEEDING *URINARY PROBLEMS (pain or burning when urinating, or frequent urination) *BOWEL PROBLEMS (unusual diarrhea, constipation, pain near the anus) TENDERNESS IN MOUTH AND THROAT WITH OR WITHOUT PRESENCE OF ULCERS (sore throat, sores in mouth, or a toothache) UNUSUAL RASH, SWELLING OR PAIN  UNUSUAL VAGINAL DISCHARGE OR ITCHING   Items with * indicate a potential emergency and should be followed up as soon as possible or go to the Emergency Department if any problems should occur.  Please show the CHEMOTHERAPY ALERT CARD or IMMUNOTHERAPY ALERT CARD at check-in to the Emergency Department and triage nurse.  Should you have questions after your visit or need to cancel or reschedule your appointment, please contact [facility] - A DEPT OF [facility]  Dept: [phone]  and follow the prompts.  Office hours are 8:00 a.m. to 4:30 p.m. Monday - Friday. Please note that voicemails left after 4:00 p.m. may not be returned until the following business day.  We are closed weekends and major holidays. You have access to a nurse at all times for urgent questions. Please call the main number to the clinic Dept: [phone] and follow the prompts.   For any non-urgent questions, you may also contact your provider using MyChart. We now offer e-Visits for anyone 25 and older to request care online for non-urgent symptoms. For details visit [email or web address].   Also download the MyChart app! Go to the app store, search "MyChart", open the app, select [place], and log in with your MyChart username and password.

## 2023-09-01 NOTE — Progress Notes (Signed)
 Patient seen by Diana Forster NP today  Vitals are within treatment parameters:Yes   Labs are within treatment parameters: No (Please specify and give further instructions.) creatinine 2.68  Treatment plan has been signed: Yes   Per physician team, Patient is ready for treatment and there are NO modifications to the treatment plan.

## 2023-09-01 NOTE — Progress Notes (Signed)
 Patients port flushed without difficulty.  Good blood return noted with no bruising or swelling noted at site.  Patient remains accessed for treatment.

## 2023-09-03 ENCOUNTER — Inpatient Hospital Stay

## 2023-09-08 ENCOUNTER — Other Ambulatory Visit: Payer: Self-pay

## 2023-09-09 ENCOUNTER — Other Ambulatory Visit: Payer: Self-pay

## 2023-09-14 ENCOUNTER — Inpatient Hospital Stay

## 2023-09-14 ENCOUNTER — Inpatient Hospital Stay: Attending: Oncology | Admitting: Nurse Practitioner

## 2023-09-14 ENCOUNTER — Encounter: Payer: Self-pay | Admitting: Nurse Practitioner

## 2023-09-14 ENCOUNTER — Telehealth: Payer: Self-pay | Admitting: Oncology

## 2023-09-14 VITALS — BP 117/58 | HR 72 | Temp 98.1°F | Resp 18 | Ht 76.0 in | Wt 208.6 lb

## 2023-09-14 DIAGNOSIS — D649 Anemia, unspecified: Secondary | ICD-10-CM | POA: Diagnosis not present

## 2023-09-14 DIAGNOSIS — C189 Malignant neoplasm of colon, unspecified: Secondary | ICD-10-CM

## 2023-09-14 DIAGNOSIS — I129 Hypertensive chronic kidney disease with stage 1 through stage 4 chronic kidney disease, or unspecified chronic kidney disease: Secondary | ICD-10-CM | POA: Insufficient documentation

## 2023-09-14 DIAGNOSIS — C187 Malignant neoplasm of sigmoid colon: Secondary | ICD-10-CM | POA: Diagnosis not present

## 2023-09-14 DIAGNOSIS — G620 Drug-induced polyneuropathy: Secondary | ICD-10-CM | POA: Insufficient documentation

## 2023-09-14 DIAGNOSIS — Z452 Encounter for adjustment and management of vascular access device: Secondary | ICD-10-CM | POA: Insufficient documentation

## 2023-09-14 DIAGNOSIS — Z95828 Presence of other vascular implants and grafts: Secondary | ICD-10-CM

## 2023-09-14 DIAGNOSIS — R21 Rash and other nonspecific skin eruption: Secondary | ICD-10-CM | POA: Insufficient documentation

## 2023-09-14 DIAGNOSIS — C7800 Secondary malignant neoplasm of unspecified lung: Secondary | ICD-10-CM | POA: Insufficient documentation

## 2023-09-14 DIAGNOSIS — C787 Secondary malignant neoplasm of liver and intrahepatic bile duct: Secondary | ICD-10-CM | POA: Diagnosis not present

## 2023-09-14 DIAGNOSIS — N189 Chronic kidney disease, unspecified: Secondary | ICD-10-CM | POA: Diagnosis not present

## 2023-09-14 DIAGNOSIS — D6959 Other secondary thrombocytopenia: Secondary | ICD-10-CM | POA: Insufficient documentation

## 2023-09-14 LAB — CMP (CANCER CENTER ONLY)
ALT: 12 U/L (ref 0–44)
AST: 12 U/L — ABNORMAL LOW (ref 15–41)
Albumin: 3 g/dL — ABNORMAL LOW (ref 3.5–5.0)
Alkaline Phosphatase: 120 U/L (ref 38–126)
Anion gap: 12 (ref 5–15)
BUN: 46 mg/dL — ABNORMAL HIGH (ref 8–23)
CO2: 16 mmol/L — ABNORMAL LOW (ref 22–32)
Calcium: 8.3 mg/dL — ABNORMAL LOW (ref 8.9–10.3)
Chloride: 108 mmol/L (ref 98–111)
Creatinine: 3.1 mg/dL — ABNORMAL HIGH (ref 0.61–1.24)
GFR, Estimated: 20 mL/min — ABNORMAL LOW (ref >60)
Glucose, Bld: 115 mg/dL — ABNORMAL HIGH (ref 70–99)
Potassium: 4.8 mmol/L (ref 3.5–5.1)
Sodium: 135 mmol/L (ref 135–145)
Total Bilirubin: 0.3 mg/dL (ref 0.0–1.2)
Total Protein: 6.1 g/dL — ABNORMAL LOW (ref 6.5–8.1)

## 2023-09-14 LAB — CBC WITH DIFFERENTIAL (CANCER CENTER ONLY)
Abs Immature Granulocytes: 0.02 10*3/uL (ref 0.00–0.07)
Basophils Absolute: 0 10*3/uL (ref 0.0–0.1)
Basophils Relative: 0 %
Eosinophils Absolute: 0.3 10*3/uL (ref 0.0–0.5)
Eosinophils Relative: 4 %
HCT: 31.6 % — ABNORMAL LOW (ref 39.0–52.0)
Hemoglobin: 10.5 g/dL — ABNORMAL LOW (ref 13.0–17.0)
Immature Granulocytes: 0 %
Lymphocytes Relative: 17 %
Lymphs Abs: 1 10*3/uL (ref 0.7–4.0)
MCH: 32.1 pg (ref 26.0–34.0)
MCHC: 33.2 g/dL (ref 30.0–36.0)
MCV: 96.6 fL (ref 80.0–100.0)
Monocytes Absolute: 1 10*3/uL (ref 0.1–1.0)
Monocytes Relative: 17 %
Neutro Abs: 3.8 10*3/uL (ref 1.7–7.7)
Neutrophils Relative %: 62 %
Platelet Count: 152 10*3/uL (ref 150–400)
RBC: 3.27 MIL/uL — ABNORMAL LOW (ref 4.22–5.81)
RDW: 16.8 % — ABNORMAL HIGH (ref 11.5–15.5)
WBC Count: 6.2 10*3/uL (ref 4.0–10.5)
nRBC: 0 % (ref 0.0–0.2)

## 2023-09-14 LAB — MAGNESIUM: Magnesium: 1.9 mg/dL (ref 1.7–2.4)

## 2023-09-14 MED ORDER — SODIUM CHLORIDE 0.9% FLUSH
10.0000 mL | Freq: Once | INTRAVENOUS | Status: AC
  Administered 2023-09-14: 10 mL via INTRAVENOUS

## 2023-09-14 MED ORDER — HEPARIN SOD (PORK) LOCK FLUSH 100 UNIT/ML IV SOLN
500.0000 [IU] | Freq: Once | INTRAVENOUS | Status: AC
  Administered 2023-09-14: 500 [IU] via INTRAVENOUS

## 2023-09-14 NOTE — Progress Notes (Unsigned)
 [facility] OFFICE PROGRESS NOTE   Diagnosis: Colon cancer  INTERVAL HISTORY:   [name] returns as scheduled.  He completed a partial cycle of FOLFIRI and Panitumumab  on 09/01/2023.  He decided to discontinue treatment near completion of the irinotecan /leucovorin  infusion.  He did not receive 5-fluorouracil .  He began feeling "bad" with nausea during the irinotecan  infusion.  He reports developing a rash and diarrhea over the next few days.  He takes Imodium as needed.  He is taking doxycycline  for the rash.  He denies pain associated with the left groin mass.  He notes some bleeding with showering.  Objective:  Vital signs in last 24 hours:  Blood pressure (!) 117/58, pulse 72, temperature 98.1 F (36.7 C), temperature source Temporal, resp. rate 18, height 6\' 4"  (1.93 m), weight 208 lb 9.6 oz (94.6 kg), SpO2 98%.    HEENT: No thrush or ulcers. Resp: Lungs clear bilaterally. Cardio: Regular rate and rhythm. GI: No hepatosplenomegaly. Vascular: No leg edema. Neuro: Large fungating mass left inguinal region. Skin: Acne type rash face/scalp/anterior chest. Port-A-Cath without erythema. Lymphatics: 2 cm mobile bilateral axillary nodes  Lab Results:  Lab Results  Component Value Date   WBC 6.2 09/14/2023   HGB 10.5 (L) 09/14/2023   HCT 31.6 (L) 09/14/2023   MCV 96.6 09/14/2023   PLT 152 09/14/2023   NEUTROABS 3.8 09/14/2023    Imaging:  No results found.  Medications: I have reviewed the patient's current medications.  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), isolated mesenteric implant-resected, multiple tumor deposits Sigmoid/descending resection and creation of a descending colostomy 04/10/2016 MSI-stable, no loss of mismatch repair protein expression Foundation 1- BRAF V600E positive, MS-stable, intermediate tumor mutation burden, no RAS mutation CT abdomen/pelvis 04/02/2016-no evidence of distant metastatic disease Cycle 1  FOLFOX 05/21/2016 Cycle 2 FOLFOX 06/04/2016 Cycle 3 FOLFOX 06/18/2016 Cycle 4 FOLFOX 07/02/2016 (oxaliplatin  held secondary to thrombocytopenia) Cycle 5 FOLFOX 07/16/2016 Cycle 6 FOLFOX 07/30/2016 Cycle 7 FOLFOX 08/13/2016 (oxaliplatin  held and 5-FU dose reduced) Cycle 8 FOLFOX 09/03/2016 (oxaliplatin  held secondary to neuropathy) Cycle 9 FOLFOX 09/17/2016 (oxaliplatin  held secondary to neuropathy) Cycle 10 FOLFOX 10/02/2016 Cycle 11 FOLFOX 10/15/2016 (oxaliplatin  eliminated from the regimen) Cycle 12 FOLFOX 10/30/2016 (oxaliplatin  eliminated) CTs 05/12/2017-no evidence of recurrent disease, right inguinal hernia containing bladder Colonoscopy 07/21/2017, 3 polyps were removed from the descending and transverse colon, fragments of tubular and tubulovillous adenoma CTs 05/19/2018-no evidence of recurrent disease, status post hernia repair, right upper lobe pneumonia CTs 05/20/2019-resolution of right upper lobe pneumonia, no evidence of metastatic disease Colonoscopy 01/25/2020-multiple polyps removed-tubular adenomas, poor preparation, repeat colonoscopy recommended CTs neck, chest, and abdomen/pelvis 08/27/2020- new 2 centimeter left axillary node, new 1.9 cm left inguinal node chronic mildly prominent portacaval node Ultrasound biopsy of left inguinal node on 10/29/2020-metastatic adenocarcinoma consistent with colon adenocarcinoma PET 11/12/2020-enlarged hypermetabolic left inguinal and left axillary nodes, solitary focus of hypermetabolic activity in the right lower quadrant small bowel Cycle 1 FOLFOX 12/19/2020 Cycle 2 FOLFOX 01/02/2021, oxaliplatin  dose reduced secondary to neutropenia and thrombocytopenia Cycle 3 FOLFOX 01/16/2021 Cycle 4 FOLFOX 01/30/2021 CTs 02/11/2021-no change in left inguinal and left axillary nodes, no evidence of disease progression, stable wall thickening involving a loop of distal ileum Cycle 1 FOLFIRI/Avastin  02/27/2021 Cycle 2 FOLFIRI/Avastin  03/13/2021 Cycle 3  FOLFIRI/Avastin  04/03/2021 Cycle 4 FOLFIRI 04/17/2021, Avastin  held due to elevated urine protein 05/07/2021-24-hour urine protein elevated 1562 Cycle 5 FOLFIRI 05/08/2021, Avastin  held due to elevated urine protein, irinotecan  dose reduced due to in general  not feeling well after treatment CTs 05/17/2021-left axillary and left inguinal lymph nodes are smaller, no evidence of disease progression Cycle 6 FOLFIRI 05/22/2021, Avastin  held due to proteinuria 06/03/2021 24-hour urine with 1.9 g of protein Cycle 7 FOLFIRI 06/12/2021, Avastin  held due to proteinuria Cycle 8 FOLFIRI 07/03/2021, Avastin  held due to proteinuria Cycle 9 FOLFIRI 07/24/2021, Avastin  held due to proteinuria Cycle 10 FOLFIRI 08/19/2021, Avastin  held due to proteinuria CTs 09/02/2021-no change in the left axillary, left inguinal, and portacaval lymph nodes.  No mention of the distal small bowel thickening Maintenance 5-fluorouracil  09/18/2021 CT abdomen/pelvis 10/28/2021-enlarged left inguinal lymph node, stable; stomach and small bowel grossly unremarkable. Maintenance 5-fluorouracil  continued Colonoscopy 12/10/2021-terminal ileum with an ulcerated partially obstructing medium-sized mass approximately 15 to 20 cm from the IC valve, invasive adenocarcinoma, colonic type, moderately differentiated (grade 2) PET scan 12/18/2021-progression of disease in the peritoneal space.  2 intensely hypermetabolic nodes in the left axilla, one node is new from the prior, the other is reduced in size and metabolic activity; decrease in size and metabolic activity of left inguinal adenopathy; intense metabolic activity associate with a loop of small bowel in the right lower quadrant, no interval change; new small hypermetabolic peritoneal implant along the ventral peritoneal surface just right of midline; larger new peritoneal implant in the deep right pelvis; small new implant in the right mid abdomen Encorafenib /Panitumumab  01/08/2022 CTs 03/17/2022-decrease size of  mesenteric implant at the posterior bladder wall, small mesenteric peritoneal lesions identified on PET/CT or not evident.  Decrease size of left inguinal left axillary nodes, hypermetabolic activity noted at the anterior left prostate on comparison August 2023 PET CTs 07/21/2022-decrease size of a left axillary lymph node and right pelvic implant.  Left inguinal lymph node measures smaller.  No new sites of disease identified.  Similar tiny pulmonary nodules. Encorafenib  and Panitumumab  continued, Panitumumab  changed from a 2-week schedule to a 3-week schedule 07/23/2022 CT 10/30/2022-stable left axillary lymph node, no new or progressive metastatic disease in the chest; stable left inguinal lymphadenopathy; decrease in size of deep right peritoneal implant; no new metastatic disease in the abdomen/pelvis. encorafenib  and Panitumumab  continued Panitumumab  dose reduced 11/25/2022 due to skin rash CTs 02/20/2023-increased left inguinal adenopathy, developing left external iliac and ileocolic mesenteric adenopathy, unchanged left axillary node, enlarging pulmonary nodules minimal enlargement of cul-de-sac nodule Encorafenib  and panitumumab  continued Panitumumab  every 2 weeks beginning 04/15/2023, continue encorafenib  Restaging CTs 05/03/2023-new low-density liver lesions.  Progression of pelvic and less so abdominal nodal metastasis.  Mild progression of pulmonary metastasis.  Left axillary adenopathy improved. Cycle 1 Lonsurf  05/19/2023, Avastin  every 2 weeks  Cycle 2 Lonsurf  06/23/2023, Avastin  every 2 weeks 07/03/2023-24-hour urine with 7 g proteinuria Cycle 3 Lonsurf  07/22/2023, Avastin  on hold due to proteinuria CTs 08/13/2023: Numerous bilateral pulmonary nodules (small, many were present on past CT), unchanged liver metastases, enlargement of left inguinal lymph node mass and left iliac/pelvic sidewall lymph nodes Cycle 1 FOLFIRI/panitumumab /encorafenib  09/01/2023-he received Panitumumab , near completion  of irinotecan /leucovorin  he decided to discontinue treatment, he did not receive 5-fluorouracil  09/14/2023 patient confirms decision to stop treatment, declines hospice referral at this time 2.   Chronic renal insufficiency   3.   Hypertension   4.  Inflamed sebaceous cyst at the upper back-status post incision and drainage   5.  Thrombocytopenia secondary chemotherapy-oxaliplatin  dose reduced beginning with cycle 3 FOLFOX, oxaliplatin  held with cycle 4 and cycle 7 FOLFOX   6.  Oxaliplatin  neuropathy-improved   7.  History of Mucositis secondary chemotherapy  8.  Symptoms of pneumonia January 2020-CT 05/19/2018 consistent with right upper lobe pneumonia, Levaquin  prescribed 05/20/2018   9.  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  Anemia, progressive 10/29/2021 2 units packed red blood cells 10/31/2021 Ferritin low 10/29/2021-oral iron Ferritin low 01/22/2022   12.  Colonoscopy 12/10/2021-terminal ileum with an ulcerated partially obstructing medium-sized mass approximately 15 to 20 cm from the IC valve, invasive adenocarcinoma, colonic type, moderately differentiated (grade 2) 13.  Admission 09/02/2022 with atrial flutter and rapid ventricular response 14.  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  History of atrial fibrillation/atrial flutter-apixaban  discontinued 08/04/2023 secondary to bleeding from the left groin mass  Disposition: [name] has metastatic colon cancer.  He began cycle 1 FOLFIRI/Panitumumab /encorafenib  09/01/2023.  Prior to completing day 1  infusions he decided to discontinue treatment.  He did not receive the 5-fluorouracil .  He confirms his decision to discontinue systemic therapy at today's visit.  We discussed a hospice referral which he declines at this time.  We discussed a referral for palliative radiation to the left groin mass.  He would like to discuss the possibility of surgical excision with Dr. Cherlynn Cornfield.  Referral  placed.  End-of-life issues/CODE STATUS were discussed at today's visit.  He would like to discuss further with his daughter before making a formal decision.  The skin rash is consistent with a Panitumumab  rash.  He will continue doxycycline  and supportive measures.  Hopefully the rash will improve over the next few weeks.  He will return for follow-up in approximately 4 weeks.  We are available to see him sooner if needed.  Patient seen with Dr. Scherrie Curt.   Diana Forster ANP/GNP-BC   09/14/2023  2:08 PM  This was a shared visit with Diana Forster.  [name] was interviewed and examined.  He completed part of cycle 1 FOLFIRI/panitumumab /encorafenib .  He decided to discontinue chemotherapy.  He confirmed he does not wish to receive further systemic therapy.  We discussed palliative radiation to the left inguinal mass.  He declines radiation at present.  We discussed hospice care.  [name] does not wish to enroll in hospice at present.  He would like to see Dr. Cherlynn Cornfield to consider resection of the left inguinal mass.  I explained the small chance the mass is resectable.  He understands resection of the inguinal mass will not impact on his survival.  The plan is to continue supportive care.  We reviewed CPR and ACLS.  He has a living will in place.  He would like to remain on a full CODE STATUS for now.  I was present for greater than 50% of today's visit.  I performed medical decision making.  Anise Kerns, MD

## 2023-09-14 NOTE — Telephone Encounter (Signed)
 Patient has been scheduled for follow-up visit per 09/14/23 LOS.  Unable to reach via phone, voicemail left requesting he give me a call back to schedule.    Follow-Up Information  Check out comments: Cancel infusion appt on 5/13; cancel appts on 5/27; flush and appt with Dr Scherrie Curt in 4 weeks (approximate, prefers a Tuesday or Wednesday)

## 2023-09-15 ENCOUNTER — Other Ambulatory Visit: Payer: Self-pay

## 2023-09-15 ENCOUNTER — Inpatient Hospital Stay

## 2023-09-15 ENCOUNTER — Encounter: Payer: Self-pay | Admitting: Oncology

## 2023-09-15 NOTE — Progress Notes (Signed)
 Per OV notes from 5/12 patient has discontinued all therapy. Confirmed with Alyson and disenrolled.

## 2023-09-17 ENCOUNTER — Encounter

## 2023-09-17 ENCOUNTER — Other Ambulatory Visit: Payer: Self-pay

## 2023-09-18 ENCOUNTER — Other Ambulatory Visit: Payer: Self-pay

## 2023-09-23 ENCOUNTER — Telehealth: Payer: Self-pay | Admitting: *Deleted

## 2023-09-24 ENCOUNTER — Telehealth: Payer: Self-pay | Admitting: *Deleted

## 2023-09-24 MED ORDER — ONDANSETRON HCL 8 MG PO TABS: 8.0000 mg | ORAL_TABLET | Freq: Three times a day (TID) | ORAL | 2 refills | Status: AC | PRN

## 2023-09-24 MED ORDER — HYOSCYAMINE SULFATE 0.125 MG PO TABS: 0.1250 mg | ORAL_TABLET | ORAL | 0 refills | Status: DC | PRN

## 2023-09-24 NOTE — Telephone Encounter (Addendum)
 Reports N/V (x2) for past 24 hours. Compazine  has not helped much and asking for something else for nausea as well as for abdominal/stomach cramping. Last BM 2 days ago, but prior to that he had a good BM. He denies any fever or family members w/similar symptoms and no abdominal distention. Instructed him to stay on liquid diet for now and will send in script for ondansetron  and inquire if MD will approve script for cramping. MD has approved ondansetron  and hyoscyamine and these were sent to pharmacy and MR. Beavan is aware. Reminded him again to stay on liquid diet and to go to ER if he develops frequent vomiting or severe abdominal pain or distention as he is at risk for bowel obstruction.

## 2023-09-25 ENCOUNTER — Other Ambulatory Visit (HOSPITAL_BASED_OUTPATIENT_CLINIC_OR_DEPARTMENT_OTHER): Payer: Self-pay

## 2023-09-25 MED ORDER — LIDOCAINE-PRILOCAINE 2.5-2.5 % EX CREA: 1.0000 | TOPICAL_CREAM | CUTANEOUS | 2 refills | Status: AC

## 2023-09-25 MED ORDER — LOSARTAN POTASSIUM 25 MG PO TABS: 25.0000 mg | ORAL_TABLET | Freq: Every day | ORAL | 3 refills | Status: DC

## 2023-09-25 MED ORDER — LABETALOL HCL 200 MG PO TABS: 200.0000 mg | ORAL_TABLET | Freq: Three times a day (TID) | ORAL | 3 refills | Status: DC

## 2023-09-25 MED ORDER — AMLODIPINE BESYLATE 10 MG PO TABS: 10.0000 mg | ORAL_TABLET | Freq: Every day | ORAL | 3 refills | Status: DC

## 2023-09-25 MED ORDER — AMLODIPINE BESYLATE 2.5 MG PO TABS: 2.5000 mg | ORAL_TABLET | Freq: Every day | ORAL | 5 refills | Status: DC

## 2023-09-25 MED ORDER — AMLODIPINE BESYLATE 10 MG PO TABS: 10.0000 mg | ORAL_TABLET | Freq: Every day | ORAL | 5 refills | Status: DC

## 2023-09-25 MED ORDER — LATANOPROST 0.005 % OP SOLN: 1.0000 [drp] | Freq: Every day | OPHTHALMIC | 3 refills | Status: DC

## 2023-09-25 MED ORDER — DORZOLAMIDE HCL-TIMOLOL MAL 2-0.5 % OP SOLN
1.0000 [drp] | Freq: Two times a day (BID) | OPHTHALMIC | 4 refills | Status: DC
Start: 2023-06-24 — End: 2023-11-12

## 2023-09-25 MED ORDER — MAGNESIUM OXIDE 400 MG PO TABS: 400.0000 mg | ORAL_TABLET | Freq: Two times a day (BID) | ORAL | 3 refills | Status: DC

## 2023-09-25 MED ORDER — APIXABAN 5 MG PO TABS: 5.0000 mg | ORAL_TABLET | Freq: Two times a day (BID) | ORAL | 5 refills | Status: DC

## 2023-09-25 MED ORDER — DOXYCYCLINE HYCLATE 100 MG PO TABS
100.0000 mg | ORAL_TABLET | Freq: Two times a day (BID) | ORAL | 3 refills | Status: DC
Start: 2023-03-23 — End: 2023-10-13

## 2023-09-29 ENCOUNTER — Other Ambulatory Visit

## 2023-09-29 ENCOUNTER — Ambulatory Visit: Admitting: Nurse Practitioner

## 2023-09-29 ENCOUNTER — Encounter: Payer: Self-pay | Admitting: Oncology

## 2023-09-29 ENCOUNTER — Ambulatory Visit

## 2023-09-29 NOTE — Telephone Encounter (Signed)
 error

## 2023-10-01 ENCOUNTER — Encounter

## 2023-10-02 ENCOUNTER — Other Ambulatory Visit: Payer: Self-pay | Admitting: Oncology

## 2023-10-11 ENCOUNTER — Other Ambulatory Visit: Payer: Self-pay | Admitting: Oncology

## 2023-10-13 ENCOUNTER — Inpatient Hospital Stay

## 2023-10-13 ENCOUNTER — Inpatient Hospital Stay: Attending: Oncology | Admitting: Oncology

## 2023-10-13 ENCOUNTER — Other Ambulatory Visit: Payer: Self-pay

## 2023-10-13 ENCOUNTER — Other Ambulatory Visit (HOSPITAL_BASED_OUTPATIENT_CLINIC_OR_DEPARTMENT_OTHER): Payer: Self-pay

## 2023-10-13 ENCOUNTER — Encounter: Payer: Self-pay | Admitting: Oncology

## 2023-10-13 ENCOUNTER — Other Ambulatory Visit: Payer: Self-pay | Admitting: *Deleted

## 2023-10-13 VITALS — BP 135/79 | HR 100 | Temp 98.3°F | Resp 18 | Ht 76.0 in | Wt 206.3 lb

## 2023-10-13 DIAGNOSIS — G620 Drug-induced polyneuropathy: Secondary | ICD-10-CM | POA: Diagnosis not present

## 2023-10-13 DIAGNOSIS — C787 Secondary malignant neoplasm of liver and intrahepatic bile duct: Secondary | ICD-10-CM | POA: Diagnosis not present

## 2023-10-13 DIAGNOSIS — D649 Anemia, unspecified: Secondary | ICD-10-CM | POA: Diagnosis not present

## 2023-10-13 DIAGNOSIS — C187 Malignant neoplasm of sigmoid colon: Secondary | ICD-10-CM | POA: Diagnosis present

## 2023-10-13 DIAGNOSIS — C7800 Secondary malignant neoplasm of unspecified lung: Secondary | ICD-10-CM | POA: Insufficient documentation

## 2023-10-13 DIAGNOSIS — I129 Hypertensive chronic kidney disease with stage 1 through stage 4 chronic kidney disease, or unspecified chronic kidney disease: Secondary | ICD-10-CM | POA: Insufficient documentation

## 2023-10-13 DIAGNOSIS — C189 Malignant neoplasm of colon, unspecified: Secondary | ICD-10-CM

## 2023-10-13 DIAGNOSIS — N189 Chronic kidney disease, unspecified: Secondary | ICD-10-CM | POA: Diagnosis not present

## 2023-10-13 DIAGNOSIS — R1909 Other intra-abdominal and pelvic swelling, mass and lump: Secondary | ICD-10-CM

## 2023-10-13 MED ORDER — DOXYCYCLINE HYCLATE 100 MG PO TABS
100.0000 mg | ORAL_TABLET | Freq: Two times a day (BID) | ORAL | 3 refills | Status: AC
  Filled 2023-10-13: qty 48, 24d supply, fill #0
  Filled 2023-10-13: qty 12, 6d supply, fill #0
  Filled 2023-11-18: qty 60, 30d supply, fill #1

## 2023-10-13 NOTE — Progress Notes (Signed)
 [facility] OFFICE PROGRESS NOTE   Diagnosis: Colon cancer  INTERVAL HISTORY:  [name] reports feeling better after recovering from the irinotecan  given 09/01/2023.  He continues to have a skin rash and requests a refill on doxycycline .  The left groin mass is larger and bleeding.  No pain.  He is here today with his wife.  He has decided to enroll in hospice care.  Objective:  Vital signs in last 24 hours:  Blood pressure 135/79, pulse 100, temperature 98.3 F (36.8 C), temperature source Temporal, resp. rate 18, height 6\' 4"  (1.93 m), weight 206 lb 4.8 oz (93.6 kg), SpO2 100%.    HEENT: No thrush or ulcers Lymphatics: 2 cm 1 cm left axillary node, large nodal mass in the left groin with skin breakdown and bleeding Resp: Lungs clear bilaterally Cardio: Regular rate and rhythm GI: No hepatosplenomegaly Vascular: No leg edema Skin: Acne type rash over the head and trunk  Portacath/PICC-without erythema  Lab Results:  Lab Results  Component Value Date   WBC 6.2 09/14/2023   HGB 10.5 (L) 09/14/2023   HCT 31.6 (L) 09/14/2023   MCV 96.6 09/14/2023   PLT 152 09/14/2023   NEUTROABS 3.8 09/14/2023    CMP  Lab Results  Component Value Date   NA 135 09/14/2023   K 4.8 09/14/2023   CL 108 09/14/2023   CO2 16 (L) 09/14/2023   GLUCOSE 115 (H) 09/14/2023   BUN 46 (H) 09/14/2023   CREATININE 3.10 (H) 09/14/2023   CALCIUM  8.3 (L) 09/14/2023   PROT 6.1 (L) 09/14/2023   ALBUMIN  3.0 (L) 09/14/2023   AST 12 (L) 09/14/2023   ALT 12 09/14/2023   ALKPHOS 120 09/14/2023   BILITOT 0.3 09/14/2023   GFRNONAA 20 (L) 09/14/2023   GFRAA 28 (L) 09/20/2019    Lab Results  Component Value Date   CEA1 2.61 01/02/2021   CEA 5.20 (H) 09/01/2023      Medications: I have reviewed the patient's current medications.   Assessment/Plan: Sigmoid colon cancer, stage IV (R6E,A5W,U9W), isolated mesenteric implant-resected, multiple tumor deposits Sigmoid/descending  resection and creation of a descending colostomy 04/10/2016 MSI-stable, no loss of mismatch repair protein expression Foundation 1- BRAF V600E positive, MS-stable, intermediate tumor mutation burden, no RAS mutation CT abdomen/pelvis 04/02/2016-no evidence of distant metastatic disease Cycle 1 FOLFOX 05/21/2016 Cycle 2 FOLFOX 06/04/2016 Cycle 3 FOLFOX 06/18/2016 Cycle 4 FOLFOX 07/02/2016 (oxaliplatin  held secondary to thrombocytopenia) Cycle 5 FOLFOX 07/16/2016 Cycle 6 FOLFOX 07/30/2016 Cycle 7 FOLFOX 08/13/2016 (oxaliplatin  held and 5-FU dose reduced) Cycle 8 FOLFOX 09/03/2016 (oxaliplatin  held secondary to neuropathy) Cycle 9 FOLFOX 09/17/2016 (oxaliplatin  held secondary to neuropathy) Cycle 10 FOLFOX 10/02/2016 Cycle 11 FOLFOX 10/15/2016 (oxaliplatin  eliminated from the regimen) Cycle 12 FOLFOX 10/30/2016 (oxaliplatin  eliminated) CTs 05/12/2017-no evidence of recurrent disease, right inguinal hernia containing bladder Colonoscopy 07/21/2017, 3 polyps were removed from the descending and transverse colon, fragments of tubular and tubulovillous adenoma CTs 05/19/2018-no evidence of recurrent disease, status post hernia repair, right upper lobe pneumonia CTs 05/20/2019-resolution of right upper lobe pneumonia, no evidence of metastatic disease Colonoscopy 01/25/2020-multiple polyps removed-tubular adenomas, poor preparation, repeat colonoscopy recommended CTs neck, chest, and abdomen/pelvis 08/27/2020- new 2 centimeter left axillary node, new 1.9 cm left inguinal node chronic mildly prominent portacaval node Ultrasound biopsy of left inguinal node on 10/29/2020-metastatic adenocarcinoma consistent with colon adenocarcinoma PET 11/12/2020-enlarged hypermetabolic left inguinal and left axillary nodes, solitary focus of hypermetabolic activity in the right lower quadrant small bowel Cycle 1 FOLFOX 12/19/2020 Cycle  2 FOLFOX 01/02/2021, oxaliplatin  dose reduced secondary to neutropenia and  thrombocytopenia Cycle 3 FOLFOX 01/16/2021 Cycle 4 FOLFOX 01/30/2021 CTs 02/11/2021-no change in left inguinal and left axillary nodes, no evidence of disease progression, stable wall thickening involving a loop of distal ileum Cycle 1 FOLFIRI/Avastin  02/27/2021 Cycle 2 FOLFIRI/Avastin  03/13/2021 Cycle 3 FOLFIRI/Avastin  04/03/2021 Cycle 4 FOLFIRI 04/17/2021, Avastin  held due to elevated urine protein 05/07/2021-24-hour urine protein elevated 1562 Cycle 5 FOLFIRI 05/08/2021, Avastin  held due to elevated urine protein, irinotecan  dose reduced due to in general not feeling well after treatment CTs 05/17/2021-left axillary and left inguinal lymph nodes are smaller, no evidence of disease progression Cycle 6 FOLFIRI 05/22/2021, Avastin  held due to proteinuria 06/03/2021 24-hour urine with 1.9 g of protein Cycle 7 FOLFIRI 06/12/2021, Avastin  held due to proteinuria Cycle 8 FOLFIRI 07/03/2021, Avastin  held due to proteinuria Cycle 9 FOLFIRI 07/24/2021, Avastin  held due to proteinuria Cycle 10 FOLFIRI 08/19/2021, Avastin  held due to proteinuria CTs 09/02/2021-no change in the left axillary, left inguinal, and portacaval lymph nodes.  No mention of the distal small bowel thickening Maintenance 5-fluorouracil  09/18/2021 CT abdomen/pelvis 10/28/2021-enlarged left inguinal lymph node, stable; stomach and small bowel grossly unremarkable. Maintenance 5-fluorouracil  continued Colonoscopy 12/10/2021-terminal ileum with an ulcerated partially obstructing medium-sized mass approximately 15 to 20 cm from the IC valve, invasive adenocarcinoma, colonic type, moderately differentiated (grade 2) PET scan 12/18/2021-progression of disease in the peritoneal space.  2 intensely hypermetabolic nodes in the left axilla, one node is new from the prior, the other is reduced in size and metabolic activity; decrease in size and metabolic activity of left inguinal adenopathy; intense metabolic activity associate with a loop of small bowel in the  right lower quadrant, no interval change; new small hypermetabolic peritoneal implant along the ventral peritoneal surface just right of midline; larger new peritoneal implant in the deep right pelvis; small new implant in the right mid abdomen Encorafenib /Panitumumab  01/08/2022 CTs 03/17/2022-decrease size of mesenteric implant at the posterior bladder wall, small mesenteric peritoneal lesions identified on PET/CT or not evident.  Decrease size of left inguinal left axillary nodes, hypermetabolic activity noted at the anterior left prostate on comparison August 2023 PET CTs 07/21/2022-decrease size of a left axillary lymph node and right pelvic implant.  Left inguinal lymph node measures smaller.  No new sites of disease identified.  Similar tiny pulmonary nodules. Encorafenib  and Panitumumab  continued, Panitumumab  changed from a 2-week schedule to a 3-week schedule 07/23/2022 CT 10/30/2022-stable left axillary lymph node, no new or progressive metastatic disease in the chest; stable left inguinal lymphadenopathy; decrease in size of deep right peritoneal implant; no new metastatic disease in the abdomen/pelvis. encorafenib  and Panitumumab  continued Panitumumab  dose reduced 11/25/2022 due to skin rash CTs 02/20/2023-increased left inguinal adenopathy, developing left external iliac and ileocolic mesenteric adenopathy, unchanged left axillary node, enlarging pulmonary nodules minimal enlargement of cul-de-sac nodule Encorafenib  and panitumumab  continued Panitumumab  every 2 weeks beginning 04/15/2023, continue encorafenib  Restaging CTs 05/03/2023-new low-density liver lesions.  Progression of pelvic and less so abdominal nodal metastasis.  Mild progression of pulmonary metastasis.  Left axillary adenopathy improved. Cycle 1 Lonsurf  05/19/2023, Avastin  every 2 weeks  Cycle 2 Lonsurf  06/23/2023, Avastin  every 2 weeks 07/03/2023-24-hour urine with 7 g proteinuria Cycle 3 Lonsurf  07/22/2023, Avastin  on hold due to  proteinuria CTs 08/13/2023: Numerous bilateral pulmonary nodules (small, many were present on past CT), unchanged liver metastases, enlargement of left inguinal lymph node mass and left iliac/pelvic sidewall lymph nodes Cycle 1 FOLFIRI/panitumumab /encorafenib  09/01/2023-he received Panitumumab , near completion of  irinotecan /leucovorin  he decided to discontinue treatment, he did not receive 5-fluorouracil  09/14/2023 patient confirms decision to stop treatment, declines hospice referral at this time 10/13/2023-hospice referral 2.   Chronic renal insufficiency   3.   Hypertension   4.  Inflamed sebaceous cyst at the upper back-status post incision and drainage   5.  Thrombocytopenia secondary chemotherapy-oxaliplatin  dose reduced beginning with cycle 3 FOLFOX, oxaliplatin  held with cycle 4 and cycle 7 FOLFOX   6.  Oxaliplatin  neuropathy-improved   7.  History of Mucositis secondary chemotherapy   8.  Symptoms of pneumonia January 2020-CT 05/19/2018 consistent with right upper lobe pneumonia, Levaquin  prescribed 05/20/2018   9.  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  Anemia, progressive 10/29/2021 2 units packed red blood cells 10/31/2021 Ferritin low 10/29/2021-oral iron Ferritin low 01/22/2022   12.  Colonoscopy 12/10/2021-terminal ileum with an ulcerated partially obstructing medium-sized mass approximately 15 to 20 cm from the IC valve, invasive adenocarcinoma, colonic type, moderately differentiated (grade 2) 13.  Admission 09/02/2022 with atrial flutter and rapid ventricular response 14.  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  History of atrial fibrillation/atrial flutter-apixaban  discontinued 08/04/2023 secondary to bleeding from the left groin mass    Disposition: [name] has metastatic colon cancer.  He has progressive disease involving a fungating left groin mass.  He agrees to referral for palliative radiation.  He also agrees to enroll in  hospice care.  We will refer him to [facility] with the plan to enroll at the completion of radiation.  He will resume doxycycline  in an attempt to help the skin rash. He understands the goals of radiation are to decrease the chance of developing pain associated with the left groin mass.  We discussed CPR and ACLS.  He would like to remain on a full CODE STATUS for now.  He reports undergoing cardioversion procedures in the past for atrial fibrillation.  I explained he could be placed on a no CODE BLUE status and still receive a cardioversion if needed.  He will discuss CODE STATUS further with his family.  [name] return for an office visit and Port-A-Cath flush in 4 weeks.  Coni Deep, MD  10/13/2023  9:49 AM

## 2023-10-13 NOTE — Progress Notes (Signed)
 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 Was initially diagnosed with colon cancer in 2017.  He has been under treatment with chemotherapy since 2017.  Most recent chemotherapy was discontinued in April 2025.   CT Abd/Pelvis 08/13/2023: Numerous bilateral pulmonary nodules (small, many were present on past CT), unchanged liver metastases, enlargement of left inguinal lymph node mass and left iliac/pelvic sidewall lymph nodes.   Biopsies of   Past/Anticipated interventions by surgeon, if any:    Past/Anticipated interventions by medical oncology, if any:  Dr. Scherrie Curt 10/13/2023 -He has progressive disease involving a fungating left groin mass.  -He agrees to referral for palliative radiation.  -We will refer him to [facility] with the plan to enroll at the completion of radiation.  -He understands the goals of radiation are to decrease the chance of developing pain associated with the left groin mass.     Weight changes, if any: He reports some weight loss especially after the diarrhea.  Bowel/Bladder complaints, if any: He reports diarrhea for about 3 weeks after his infusion, this has resolved.  Nausea / Vomiting, if any: He reports some vomiting after his chemo infusion.  Has resolved.  Pain issues, if any:     SAFETY ISSUES: Prior radiation? No Pacemaker/ICD? No Possible current pregnancy? N/a Is the patient on methotrexate? No  Current Complaints/Details:

## 2023-10-13 NOTE — Addendum Note (Signed)
 Addended by: Baldomero Bone A on: 10/13/2023 10:17 AM   Modules accepted: Orders

## 2023-10-14 ENCOUNTER — Ambulatory Visit
Admission: RE | Admit: 2023-10-14 | Discharge: 2023-10-14 | Disposition: A | Discharge status: Home / Self Care | Source: Ambulatory Visit | Attending: Radiation Oncology | Admitting: Radiation Oncology

## 2023-10-14 VITALS — BP 153/92 | HR 94 | Temp 97.8°F | Resp 20 | Ht 76.0 in | Wt 208.2 lb

## 2023-10-14 DIAGNOSIS — E785 Hyperlipidemia, unspecified: Secondary | ICD-10-CM | POA: Insufficient documentation

## 2023-10-14 DIAGNOSIS — Z79899 Other long term (current) drug therapy: Secondary | ICD-10-CM | POA: Diagnosis not present

## 2023-10-14 DIAGNOSIS — C187 Malignant neoplasm of sigmoid colon: Secondary | ICD-10-CM

## 2023-10-14 DIAGNOSIS — Z933 Colostomy status: Secondary | ICD-10-CM | POA: Insufficient documentation

## 2023-10-14 DIAGNOSIS — I129 Hypertensive chronic kidney disease with stage 1 through stage 4 chronic kidney disease, or unspecified chronic kidney disease: Secondary | ICD-10-CM | POA: Diagnosis not present

## 2023-10-14 DIAGNOSIS — Z85828 Personal history of other malignant neoplasm of skin: Secondary | ICD-10-CM | POA: Diagnosis not present

## 2023-10-14 DIAGNOSIS — Z923 Personal history of irradiation: Secondary | ICD-10-CM | POA: Diagnosis not present

## 2023-10-14 DIAGNOSIS — C7800 Secondary malignant neoplasm of unspecified lung: Secondary | ICD-10-CM | POA: Insufficient documentation

## 2023-10-14 DIAGNOSIS — C787 Secondary malignant neoplasm of liver and intrahepatic bile duct: Secondary | ICD-10-CM | POA: Diagnosis not present

## 2023-10-14 DIAGNOSIS — C7982 Secondary malignant neoplasm of genital organs: Secondary | ICD-10-CM | POA: Diagnosis not present

## 2023-10-14 DIAGNOSIS — C774 Secondary and unspecified malignant neoplasm of inguinal and lower limb lymph nodes: Secondary | ICD-10-CM | POA: Insufficient documentation

## 2023-10-14 DIAGNOSIS — N182 Chronic kidney disease, stage 2 (mild): Secondary | ICD-10-CM | POA: Diagnosis not present

## 2023-10-14 NOTE — Progress Notes (Signed)
 Radiation Oncology         [phone]) [phone] ________________________________  Name: Louis [name].        MRN: [id number]  Date of Service: 10/14/2023 DOB: 08-Apr-1946  [id number], Drexel Gentles, MD  Sumner Ends, MD     REFERRING PHYSICIAN: Sumner Ends, MD   DIAGNOSIS: The primary encounter diagnosis was Metastasis to groin lymph node (HCC). A diagnosis of Malignant neoplasm of sigmoid colon [facility]) was also pertinent to this visit.   HISTORY OF PRESENT ILLNESS: Louis [name]. is a 78 y.o. male seen at the request of Dr. Scherrie Curt for a diagnosis of recurrent metastatic colon cancer.  The patient was originally diagnosed with his cancer in 2017 and underwent sigmoid resection and descending colostomy followed by adjuvant chemotherapy.  He was found to have recurrent disease in April 2022 in the axilla and left inguinal lymph node stations a biopsy of his left inguinal lymph node on 10/29/2020 confirmed metastatic colon cancer and he was started on systemic chemotherapy.  Following FOLFIRI and Avastin  with adjustments due to proteinuria, he continued on maintenance 5-FU.  Progressive disease along the peritoneum was noted in 2023 by PET scan, and he was switched in September 2023 to Encorafenib /Panitumumab .  A [name] decrease in his nodal disease and peritoneal disease were noted and he continued on this regimen until December 2024 when CT imaging showed new disease in his liver and progressive pelvic disease.  He was started on Lonsurf  and Avastin  in January 2025 with adjustments in his Avastin  again due to proteinuria but imaging in April 2025 showed numerous bilateral, Neri nodules, stable liver disease, and enlargement of his left inguinal nodal and left iliac, pelvic sidewall nodes.  He was receiving panitumumab  and near completion of Louis [name] and leucovorin  he decided to discontinue treatment and did not receive 5 FU.  He returned to discuss his decision with Dr. Tenna Fees in  May and confirmed that he wanted to discontinue systemic therapy but was not quite ready for enrollment in hospice care.  When he was seen yesterday he reviewed problems with quality of life that are the result of his left inguinal disease eroding the skin, and he has been referred to us  to consider palliative radiation due  bleeding at the site.  Dr. Scherrie Curt plans to refer him to hospice at the completion of radiotherapy.    PREVIOUS RADIATION THERAPY: No   PAST MEDICAL HISTORY:  Past Medical History:  Diagnosis Date   Allergy    seasonal allergies   Anemia    Arthritis 2010   Ascending aorta dilatation (HCC)    4.1 cm noted on CT1/15/21   Basal cell carcinoma 01/08/2023   right lower leg  needs mohs   Blood transfusion without reported diagnosis    CKD (chronic kidney disease) stage 2, GFR 60-89 ml/min    see [place], Stage 1   colon ca dx'd 04/2016   colon   Hernia, abdominal    sx fixed   Hyperlipidemia    on meds   Hypertension    on meds   LBBB (left bundle branch block) 08/18/2019   Neuropathy    Pneumonia 05/19/2018   Seasonal allergies    Thrombocytopenia (HCC)    during chemo.       PAST SURGICAL HISTORY: Past Surgical History:  Procedure Laterality Date   A-FLUTTER ABLATION N/A 10/10/2022   Procedure: A-FLUTTER ABLATION;  Surgeon: Lei Pump, MD;  Location: [facility] INVASIVE CV  LAB;  Service: Cardiovascular;  Laterality: N/A;   BIOPSY  04/10/2016   Procedure: BIOPSY Mesentary;  Surgeon: Jerryl Morin, MD;  Location: [facility] OR;  Service: General;;   CARDIOVERSION N/A 09/04/2022   Procedure: CARDIOVERSION;  Surgeon: Harrold Lincoln, MD;  Location: [facility] INVASIVE CV LAB;  Service: Cardiovascular;  Laterality: N/A;   COLONOSCOPY  2019   TA/tubulovillous adenoma   COLOSTOMY  2018   reversed   COLOSTOMY REVERSAL N/A 01/12/2017   Procedure: COLOSTOMY REVERSAL;  Surgeon: Jerryl Morin, MD;  Location: [facility] OR;  Service: General;  Laterality: N/A;   FLEXIBLE  SIGMOIDOSCOPY N/A 04/09/2016   Procedure: FLEXIBLE SIGMOIDOSCOPY;  Surgeon: Asencion Blacksmith, MD;  Location: [facility] ENDOSCOPY;  Service: Endoscopy;  Laterality: N/A;   HERNIA REPAIR     in childhood   INGUINAL HERNIA REPAIR Right 03/29/2018   Procedure: OPEN REPAIR OF RIGHT INGUINAL HERNIA WITH MESH;  Surgeon: Jerryl Morin, MD;  Location: [facility] OR;  Service: General;  Laterality: Right;   INSERTION OF MESH N/A 03/29/2018   Procedure: INSERTION OF MESH;  Surgeon: Jerryl Morin, MD;  Location: [facility] OR;  Service: General;  Laterality: N/A;   IR GENERIC HISTORICAL  05/14/2016   IR US  GUIDE VASC ACCESS RIGHT 05/14/2016 Myrlene Asper, DO [facility]-INTERV RAD   IR GENERIC HISTORICAL  05/14/2016   IR FLUORO GUIDE PORT INSERTION RIGHT 05/14/2016 Myrlene Asper, DO [facility]-INTERV RAD   IR IMAGING GUIDED PORT INSERTION  12/17/2020   IR REMOVAL TUN ACCESS W/ PORT W/O FL MOD SED  11/11/2016   PARTIAL COLECTOMY N/A 04/10/2016   Procedure: SIGMOID  COLECTOMY WITH COLOSTOMY;  Surgeon: Jerryl Morin, MD;  Location: [facility] OR;  Service: General;  Laterality: N/A;   TEE WITHOUT CARDIOVERSION N/A 09/04/2022   Procedure: TRANSESOPHAGEAL ECHOCARDIOGRAM;  Surgeon: Harrold Lincoln, MD;  Location: [facility] INVASIVE CV LAB;  Service: Cardiovascular;  Laterality: N/A;   TONSILLECTOMY     TOTAL KNEE ARTHROPLASTY Left 09/29/2019   Procedure: TOTAL KNEE ARTHROPLASTY;  Surgeon: Claiborne Crew, MD;  Location: [facility] ORS;  Service: Orthopedics;  Laterality: Left;  70 mins     FAMILY HISTORY:  Family History  Problem Relation Age of Onset   Hyperlipidemia Father    Diverticulitis Brother    Colon cancer Neg Hx    Esophageal cancer Neg Hx    Rectal cancer Neg Hx    Stomach cancer Neg Hx    Colon polyps Neg Hx    Crohn's disease Neg Hx      SOCIAL HISTORY:  reports that he has never smoked. He has never been exposed to tobacco smoke. He has never used smokeless tobacco. He reports current alcohol use of about 3.0 - 4.0 standard drinks of alcohol per week. He  reports that he does not use drugs.  The patient is married and lives in [place].  He's accompanied by his wife.    ALLERGIES: Other   MEDICATIONS:  Current Outpatient Medications  Medication Sig Dispense Refill   acetaminophen  (TYLENOL ) 650 MG CR tablet Take 650 mg by mouth in the morning.     amLODipine  (NORVASC ) 10 MG tablet Take 10 mg by mouth daily. (Patient not taking: Reported on 10/13/2023)     amLODipine  (NORVASC ) 10 MG tablet Take 1 tablet (10 mg total) by mouth daily. (Patient not taking: Reported on 10/13/2023) 90 tablet 3   amLODipine  (NORVASC ) 10 MG tablet Take 1 tablet (10 mg total) by mouth daily. (Patient not taking: Reported on 10/13/2023) 60 tablet 5  amLODipine  (NORVASC ) 2.5 MG tablet Take 1 tablet (2.5 mg total) by mouth daily. (Patient not taking: Reported on 10/13/2023) 30 tablet 5   dorzolamide -timolol  (COSOPT ) 2-0.5 % ophthalmic solution Place 1 drop into both eyes 2 (two) times daily.     dorzolamide -timolol  (COSOPT ) 2-0.5 % ophthalmic solution Place 1 drop into both eyes 2 (two) times daily as directed. 10 mL 4   doxycycline  (VIBRA -TABS) 100 MG tablet Take 1 tablet (100 mg total) by mouth 2 (two) times daily. 60 tablet 5   doxycycline  (VIBRA -TABS) 100 MG tablet Take 1 tablet (100 mg total) by mouth 2 (two) times daily. 60 tablet 3   ferrous sulfate  325 (65 FE) MG EC tablet Take 1 tablet (325 mg total) by mouth 2 (two) times daily with a meal. 60 tablet 2   hyoscyamine  (LEVSIN ) 0.125 MG tablet TAKE 1 TABLET(0.125 MG) BY MOUTH EVERY 4 HOURS AS NEEDED FOR CRAMPING 120 tablet 1   labetalol  (NORMODYNE ) 200 MG tablet Take 1 tablet (200 mg total) by mouth 3 (three) times daily. 270 tablet 3   latanoprost  (XALATAN ) 0.005 % ophthalmic solution Place 1 drop into both eyes at bedtime.     latanoprost  (XALATAN ) 0.005 % ophthalmic solution Place 1 drop into both eyes every night at bedtime. 2.5 mL 3   lidocaine -prilocaine  (EMLA ) cream Apply to port site 1-2 hours prior to access  and cover with plastic wrap to numb site as needed. 30 g 2   lidocaine -prilocaine  (EMLA ) cream Apply to port site 1-2 hours prior to access and cover with plastic wrap to numb site. 30 g 2   loratadine  (CLARITIN ) 10 MG tablet Take 10 mg by mouth daily as needed for allergies.      losartan  (COZAAR ) 25 MG tablet Take 1 tablet (25 mg total) by mouth daily. 90 tablet 3   magnesium  oxide (MAG-OX) 400 MG tablet Take 1 tablet (400 mg total) by mouth 2 (two) times daily. 60 tablet 3   metoprolol  tartrate (LOPRESSOR ) 25 MG tablet Take 2 tablets (50 mg total) by mouth 2 (two) times daily. 360 tablet 3   ondansetron  (ZOFRAN ) 8 MG tablet Take 1 tablet (8 mg total) by mouth every 8 (eight) hours as needed for nausea. 30 tablet 2   prochlorperazine  (COMPAZINE ) 10 MG tablet Take 1 tablet (10 mg total) by mouth every 6 (six) hours as needed for nausea or vomiting. (Patient not taking: Reported on 07/06/2023) 30 tablet 3   tamsulosin (FLOMAX) 0.4 MG CAPS capsule Take 0.4 mg by mouth 2 (two) times daily.     No current facility-administered medications for this encounter.     REVIEW OF SYSTEMS: On review of systems, the patient reports that he is really doing pretty well overall. He states his main concern is that he is not tolerating chemotherapy and wishes to discontinue this. He reports when he's not in the midst of chemo, he actually feels pretty good. He denies any current symptoms of pain in the groin, but over the last month, he notes the site ooze after taking a shower. He does not used any dressings afterwards or have to change clothing because of the bleeding. He denies any fullness in his mons pubis, or in his thigh or lower extremity on the left.      PHYSICAL EXAM:  Wt Readings from Last 3 Encounters:  10/14/23 208 lb 3.2 oz (94.4 kg)  10/13/23 206 lb 4.8 oz (93.6 kg)  09/14/23 208 lb 9.6 oz (94.6 kg)  Temp Readings from Last 3 Encounters:  10/14/23 97.8 F (36.6 C)  10/13/23 98.3 F (36.8 C)  (Temporal)  09/14/23 98.1 F (36.7 C) (Temporal)   BP Readings from Last 3 Encounters:  10/14/23 (!) 153/92  10/13/23 135/79  09/14/23 (!) 117/58   Pulse Readings from Last 3 Encounters:  10/14/23 94  10/13/23 100  09/14/23 72   Pain Assessment Pain Score: 0-No pain/10  In general this is a well  appearing Caucasian male in no acute distress. He's alert and oriented x4 and appropriate throughout the examination. Cardiopulmonary assessment is negative for acute distress and he exhibits normal effort. Further exam is deferred if he were to proceed with simulation     ECOG = 1  0 - Asymptomatic (Fully active, able to carry on all predisease activities without restriction)  1 - Symptomatic but completely ambulatory (Restricted in physically strenuous activity but ambulatory and able to carry out work of a light or sedentary nature. For example, light housework, office work)  2 - Symptomatic, <50% in bed during the day (Ambulatory and capable of all self care but unable to carry out any work activities. Up and about more than 50% of waking hours)  3 - Symptomatic, >50% in bed, but not bedbound (Capable of only limited self-care, confined to bed or chair 50% or more of waking hours)  4 - Bedbound (Completely disabled. Cannot carry on any self-care. Totally confined to bed or chair)  5 - Death   Aurea Blossom MM, Creech RH, Tormey DC, et al. [phone]). Toxicity and response criteria of the [facility]. Am. Hillard Lowes. Oncol. 5 (6): 649-55    LABORATORY DATA:  Lab Results  Component Value Date   WBC 6.2 09/14/2023   HGB 10.5 (L) 09/14/2023   HCT 31.6 (L) 09/14/2023   MCV 96.6 09/14/2023   PLT 152 09/14/2023   Lab Results  Component Value Date   NA 135 09/14/2023   K 4.8 09/14/2023   CL 108 09/14/2023   CO2 16 (L) 09/14/2023   Lab Results  Component Value Date   ALT 12 09/14/2023   AST 12 (L) 09/14/2023   ALKPHOS 120 09/14/2023   BILITOT 0.3 09/14/2023       RADIOGRAPHY: No results found.     IMPRESSION/PLAN: 1. 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 Dr. Jeryl Moris discusses the rationale to consider a palliative course of radiation. We discussed the risks, benefits, short, and long term effects of radiotherapy, as well as the  intent, and the patient is interested in proceeding. Dr. Jeryl Moris discusses the delivery and logistics of radiotherapy and anticipates a course of 1-2 weeks of radiotherapy for purposes of reducing bleeding or pain. The patient is interested in considering his options. Given his symptoms are manageable, the patient would like to let us  know in about a week and a half of how he may desire to proceed.    In a visit lasting 60 minutes, greater than 50% of the time was spent face to face discussing the patient's condition, in preparation for the discussion, and coordinating the patient's care.   The above documentation reflects my direct findings during this shared patient visit. Please see the separate note by Dr. Jeryl Moris on this date for the remainder of the patient's plan of care.    Shelvia Dick, [facility]   **Disclaimer: This note was dictated with  voice recognition software. Similar sounding words can inadvertently be transcribed and this note may contain transcription errors which may not have been corrected upon publication of note.**

## 2023-10-15 ENCOUNTER — Ambulatory Visit: Admitting: Radiation Oncology

## 2023-10-26 ENCOUNTER — Telehealth: Payer: Self-pay | Admitting: Radiation Oncology

## 2023-10-26 NOTE — Telephone Encounter (Signed)
 I called and spoke with the patient and he has decided to proceed with radiation. We discussed 1-2 weeks of therapy, and since he's so active and this area is only causing minor impacts to his quality of life and since he is not ready to consider hospice, we settled on a course of 2 weeks of treatment. He will come in this week for simulation after being contacted by our team to coordinate.

## 2023-10-27 ENCOUNTER — Ambulatory Visit
Admission: RE | Admit: 2023-10-27 | Discharge: 2023-10-27 | Disposition: A | Discharge status: Home / Self Care | Source: Ambulatory Visit | Attending: Radiation Oncology | Admitting: Radiation Oncology

## 2023-10-27 DIAGNOSIS — C187 Malignant neoplasm of sigmoid colon: Secondary | ICD-10-CM | POA: Insufficient documentation

## 2023-10-27 DIAGNOSIS — C7982 Secondary malignant neoplasm of genital organs: Secondary | ICD-10-CM | POA: Diagnosis not present

## 2023-10-27 DIAGNOSIS — C774 Secondary and unspecified malignant neoplasm of inguinal and lower limb lymph nodes: Secondary | ICD-10-CM | POA: Diagnosis present

## 2023-10-29 DIAGNOSIS — C774 Secondary and unspecified malignant neoplasm of inguinal and lower limb lymph nodes: Secondary | ICD-10-CM | POA: Diagnosis not present

## 2023-10-29 DIAGNOSIS — C7982 Secondary malignant neoplasm of genital organs: Secondary | ICD-10-CM | POA: Diagnosis not present

## 2023-10-29 DIAGNOSIS — C187 Malignant neoplasm of sigmoid colon: Secondary | ICD-10-CM | POA: Diagnosis not present

## 2023-11-04 ENCOUNTER — Ambulatory Visit
Admission: RE | Admit: 2023-11-04 | Discharge: 2023-11-04 | Disposition: A | Discharge status: Home / Self Care | Source: Ambulatory Visit | Attending: Radiation Oncology | Admitting: Radiation Oncology

## 2023-11-04 ENCOUNTER — Other Ambulatory Visit: Payer: Self-pay

## 2023-11-04 DIAGNOSIS — C774 Secondary and unspecified malignant neoplasm of inguinal and lower limb lymph nodes: Secondary | ICD-10-CM | POA: Diagnosis not present

## 2023-11-04 DIAGNOSIS — C7982 Secondary malignant neoplasm of genital organs: Secondary | ICD-10-CM | POA: Diagnosis not present

## 2023-11-04 DIAGNOSIS — C187 Malignant neoplasm of sigmoid colon: Secondary | ICD-10-CM | POA: Diagnosis not present

## 2023-11-04 DIAGNOSIS — Z510 Encounter for antineoplastic radiation therapy: Secondary | ICD-10-CM | POA: Diagnosis not present

## 2023-11-04 LAB — RAD ONC ARIA SESSION SUMMARY
Course Elapsed Days: 0
Plan Fractions Treated to Date: 1
Plan Prescribed Dose Per Fraction: 3 Gy
Plan Total Fractions Prescribed: 10
Plan Total Prescribed Dose: 30 Gy
Reference Point Dosage Given to Date: 3 Gy
Reference Point Session Dosage Given: 3 Gy
Session Number: 1

## 2023-11-05 ENCOUNTER — Other Ambulatory Visit: Payer: Self-pay

## 2023-11-05 ENCOUNTER — Ambulatory Visit
Admission: RE | Admit: 2023-11-05 | Discharge: 2023-11-05 | Disposition: A | Discharge status: Home / Self Care | Source: Ambulatory Visit | Attending: Radiation Oncology | Admitting: Radiation Oncology

## 2023-11-05 DIAGNOSIS — C7982 Secondary malignant neoplasm of genital organs: Secondary | ICD-10-CM | POA: Diagnosis not present

## 2023-11-05 DIAGNOSIS — C187 Malignant neoplasm of sigmoid colon: Secondary | ICD-10-CM | POA: Diagnosis not present

## 2023-11-05 DIAGNOSIS — Z510 Encounter for antineoplastic radiation therapy: Secondary | ICD-10-CM | POA: Diagnosis not present

## 2023-11-05 DIAGNOSIS — C774 Secondary and unspecified malignant neoplasm of inguinal and lower limb lymph nodes: Secondary | ICD-10-CM | POA: Diagnosis not present

## 2023-11-05 LAB — RAD ONC ARIA SESSION SUMMARY
Course Elapsed Days: 1
Plan Fractions Treated to Date: 2
Plan Prescribed Dose Per Fraction: 3 Gy
Plan Total Fractions Prescribed: 10
Plan Total Prescribed Dose: 30 Gy
Reference Point Dosage Given to Date: 6 Gy
Reference Point Session Dosage Given: 3 Gy
Session Number: 2

## 2023-11-09 ENCOUNTER — Other Ambulatory Visit: Payer: Self-pay

## 2023-11-09 ENCOUNTER — Ambulatory Visit
Admission: RE | Admit: 2023-11-09 | Discharge: 2023-11-09 | Disposition: A | Discharge status: Home / Self Care | Source: Ambulatory Visit | Attending: Radiation Oncology | Admitting: Radiation Oncology

## 2023-11-09 DIAGNOSIS — C774 Secondary and unspecified malignant neoplasm of inguinal and lower limb lymph nodes: Secondary | ICD-10-CM | POA: Diagnosis not present

## 2023-11-09 DIAGNOSIS — C7982 Secondary malignant neoplasm of genital organs: Secondary | ICD-10-CM | POA: Diagnosis not present

## 2023-11-09 DIAGNOSIS — C187 Malignant neoplasm of sigmoid colon: Secondary | ICD-10-CM | POA: Diagnosis not present

## 2023-11-09 DIAGNOSIS — Z510 Encounter for antineoplastic radiation therapy: Secondary | ICD-10-CM | POA: Diagnosis not present

## 2023-11-09 LAB — RAD ONC ARIA SESSION SUMMARY
Course Elapsed Days: 5
Plan Fractions Treated to Date: 3
Plan Prescribed Dose Per Fraction: 3 Gy
Plan Total Fractions Prescribed: 10
Plan Total Prescribed Dose: 30 Gy
Reference Point Dosage Given to Date: 9 Gy
Reference Point Session Dosage Given: 3 Gy
Session Number: 3

## 2023-11-10 ENCOUNTER — Other Ambulatory Visit: Payer: Self-pay

## 2023-11-10 ENCOUNTER — Other Ambulatory Visit (HOSPITAL_BASED_OUTPATIENT_CLINIC_OR_DEPARTMENT_OTHER): Payer: Self-pay

## 2023-11-10 ENCOUNTER — Telehealth: Payer: Self-pay | Admitting: *Deleted

## 2023-11-10 ENCOUNTER — Ambulatory Visit
Admission: RE | Admit: 2023-11-10 | Discharge: 2023-11-10 | Disposition: A | Discharge status: Home / Self Care | Source: Ambulatory Visit | Attending: Radiation Oncology

## 2023-11-10 DIAGNOSIS — C774 Secondary and unspecified malignant neoplasm of inguinal and lower limb lymph nodes: Secondary | ICD-10-CM | POA: Diagnosis not present

## 2023-11-10 DIAGNOSIS — C7982 Secondary malignant neoplasm of genital organs: Secondary | ICD-10-CM | POA: Diagnosis not present

## 2023-11-10 DIAGNOSIS — Z510 Encounter for antineoplastic radiation therapy: Secondary | ICD-10-CM | POA: Diagnosis not present

## 2023-11-10 DIAGNOSIS — C189 Malignant neoplasm of colon, unspecified: Secondary | ICD-10-CM

## 2023-11-10 DIAGNOSIS — C187 Malignant neoplasm of sigmoid colon: Secondary | ICD-10-CM | POA: Diagnosis not present

## 2023-11-10 LAB — RAD ONC ARIA SESSION SUMMARY
Course Elapsed Days: 6
Plan Fractions Treated to Date: 4
Plan Prescribed Dose Per Fraction: 3 Gy
Plan Total Fractions Prescribed: 10
Plan Total Prescribed Dose: 30 Gy
Reference Point Dosage Given to Date: 12 Gy
Reference Point Session Dosage Given: 3 Gy
Session Number: 4

## 2023-11-10 MED FILL — Metoprolol Tartrate Tab 25 MG: ORAL | 90 days supply | Qty: 360 | Fill #0 | Status: AC

## 2023-11-10 NOTE — Telephone Encounter (Signed)
 Mr. Laurie called to request CEA and blood counts be checked when he is here tomorrow. OK per Dr. Cloretta and orders added.

## 2023-11-11 ENCOUNTER — Ambulatory Visit
Admission: RE | Admit: 2023-11-11 | Discharge: 2023-11-11 | Disposition: A | Discharge status: Home / Self Care | Source: Ambulatory Visit | Attending: Radiation Oncology | Admitting: Radiation Oncology

## 2023-11-11 ENCOUNTER — Other Ambulatory Visit: Payer: Self-pay

## 2023-11-11 DIAGNOSIS — C187 Malignant neoplasm of sigmoid colon: Secondary | ICD-10-CM | POA: Diagnosis not present

## 2023-11-11 DIAGNOSIS — C774 Secondary and unspecified malignant neoplasm of inguinal and lower limb lymph nodes: Secondary | ICD-10-CM | POA: Diagnosis not present

## 2023-11-11 DIAGNOSIS — C7982 Secondary malignant neoplasm of genital organs: Secondary | ICD-10-CM | POA: Diagnosis not present

## 2023-11-11 DIAGNOSIS — Z510 Encounter for antineoplastic radiation therapy: Secondary | ICD-10-CM | POA: Diagnosis not present

## 2023-11-11 LAB — RAD ONC ARIA SESSION SUMMARY
Course Elapsed Days: 7
Plan Fractions Treated to Date: 5
Plan Prescribed Dose Per Fraction: 3 Gy
Plan Total Fractions Prescribed: 10
Plan Total Prescribed Dose: 30 Gy
Reference Point Dosage Given to Date: 15 Gy
Reference Point Session Dosage Given: 3 Gy
Session Number: 5

## 2023-11-12 ENCOUNTER — Inpatient Hospital Stay (HOSPITAL_BASED_OUTPATIENT_CLINIC_OR_DEPARTMENT_OTHER): Attending: Oncology | Admitting: Oncology

## 2023-11-12 ENCOUNTER — Other Ambulatory Visit: Payer: Self-pay

## 2023-11-12 ENCOUNTER — Inpatient Hospital Stay

## 2023-11-12 ENCOUNTER — Ambulatory Visit
Admission: RE | Admit: 2023-11-12 | Discharge: 2023-11-12 | Disposition: A | Discharge status: Home / Self Care | Source: Ambulatory Visit | Attending: Radiation Oncology | Admitting: Radiation Oncology

## 2023-11-12 VITALS — BP 139/80 | HR 76 | Temp 98.9°F | Resp 18 | Ht 76.0 in | Wt 211.0 lb

## 2023-11-12 DIAGNOSIS — C787 Secondary malignant neoplasm of liver and intrahepatic bile duct: Secondary | ICD-10-CM | POA: Insufficient documentation

## 2023-11-12 DIAGNOSIS — Z9221 Personal history of antineoplastic chemotherapy: Secondary | ICD-10-CM | POA: Insufficient documentation

## 2023-11-12 DIAGNOSIS — C189 Malignant neoplasm of colon, unspecified: Secondary | ICD-10-CM | POA: Diagnosis not present

## 2023-11-12 DIAGNOSIS — C774 Secondary and unspecified malignant neoplasm of inguinal and lower limb lymph nodes: Secondary | ICD-10-CM | POA: Insufficient documentation

## 2023-11-12 DIAGNOSIS — C187 Malignant neoplasm of sigmoid colon: Secondary | ICD-10-CM | POA: Insufficient documentation

## 2023-11-12 DIAGNOSIS — C7982 Secondary malignant neoplasm of genital organs: Secondary | ICD-10-CM | POA: Diagnosis not present

## 2023-11-12 DIAGNOSIS — Z923 Personal history of irradiation: Secondary | ICD-10-CM | POA: Insufficient documentation

## 2023-11-12 DIAGNOSIS — Z510 Encounter for antineoplastic radiation therapy: Secondary | ICD-10-CM | POA: Diagnosis not present

## 2023-11-12 LAB — RAD ONC ARIA SESSION SUMMARY
Course Elapsed Days: 8
Plan Fractions Treated to Date: 6
Plan Prescribed Dose Per Fraction: 3 Gy
Plan Total Fractions Prescribed: 10
Plan Total Prescribed Dose: 30 Gy
Reference Point Dosage Given to Date: 18 Gy
Reference Point Session Dosage Given: 3 Gy
Session Number: 6

## 2023-11-12 LAB — CMP (CANCER CENTER ONLY)
ALT: 10 U/L (ref 0–44)
AST: 19 U/L (ref 15–41)
Albumin: 3.3 g/dL — ABNORMAL LOW (ref 3.5–5.0)
Alkaline Phosphatase: 145 U/L — ABNORMAL HIGH (ref 38–126)
Anion gap: 13 (ref 5–15)
BUN: 37 mg/dL — ABNORMAL HIGH (ref 8–23)
CO2: 16 mmol/L — ABNORMAL LOW (ref 22–32)
Calcium: 8.5 mg/dL — ABNORMAL LOW (ref 8.9–10.3)
Chloride: 108 mmol/L (ref 98–111)
Creatinine: 2.68 mg/dL — ABNORMAL HIGH (ref 0.61–1.24)
GFR, Estimated: 24 mL/min — ABNORMAL LOW (ref >60)
Glucose, Bld: 126 mg/dL — ABNORMAL HIGH (ref 70–99)
Potassium: 4.3 mmol/L (ref 3.5–5.1)
Sodium: 137 mmol/L (ref 135–145)
Total Bilirubin: 0.4 mg/dL (ref 0.0–1.2)
Total Protein: 6.6 g/dL (ref 6.5–8.1)

## 2023-11-12 LAB — CEA (ACCESS): CEA (CHCC): 7.96 ng/mL — ABNORMAL HIGH (ref 0.00–5.00)

## 2023-11-12 LAB — SAMPLE TO BLOOD BANK

## 2023-11-12 LAB — CBC WITH DIFFERENTIAL (CANCER CENTER ONLY)
Abs Immature Granulocytes: 0.04 K/uL (ref 0.00–0.07)
Basophils Absolute: 0 K/uL (ref 0.0–0.1)
Basophils Relative: 0 %
Eosinophils Absolute: 0.1 K/uL (ref 0.0–0.5)
Eosinophils Relative: 2 %
HCT: 31.4 % — ABNORMAL LOW (ref 39.0–52.0)
Hemoglobin: 9.9 g/dL — ABNORMAL LOW (ref 13.0–17.0)
Immature Granulocytes: 1 %
Lymphocytes Relative: 13 %
Lymphs Abs: 0.8 K/uL (ref 0.7–4.0)
MCH: 30.6 pg (ref 26.0–34.0)
MCHC: 31.5 g/dL (ref 30.0–36.0)
MCV: 96.9 fL (ref 80.0–100.0)
Monocytes Absolute: 0.6 K/uL (ref 0.1–1.0)
Monocytes Relative: 10 %
Neutro Abs: 4.3 K/uL (ref 1.7–7.7)
Neutrophils Relative %: 74 %
Platelet Count: 203 K/uL (ref 150–400)
RBC: 3.24 MIL/uL — ABNORMAL LOW (ref 4.22–5.81)
RDW: 15.8 % — ABNORMAL HIGH (ref 11.5–15.5)
WBC Count: 5.8 K/uL (ref 4.0–10.5)
nRBC: 0 % (ref 0.0–0.2)

## 2023-11-12 LAB — MAGNESIUM: Magnesium: 2 mg/dL (ref 1.7–2.4)

## 2023-11-12 NOTE — Progress Notes (Signed)
 [facility] OFFICE PROGRESS NOTE   Diagnosis: Colon cancer  INTERVAL HISTORY:   Louis [name] returns as scheduled.  He began radiation to the left groin mass on 11/04/2023.  He reports the mass has not changed.  The mass bleeds intermittently.  No new complaint.  He has a good appetite.  Objective:  Vital signs in last 24 hours:  Blood pressure 139/80, pulse 76, temperature 98.9 F (37.2 C), temperature source Temporal, resp. rate 18, height 6' 4 (1.93 m), weight 211 lb (95.7 kg), SpO2 100%. Lymphatics: 1-2 cm left axillary nodes, no right axillary nodes, no right inguinal node, large fungating mass in the left groin without bleeding Resp: Lungs clear bilaterally Cardio: Regular rate and rhythm GI: No hepatosplenomegaly, no mass, nontender Vascular: No leg edema Skin: Mild acne type rash over the head and trunk   Portacath/PICC-without erythema  Lab Results:  Lab Results  Component Value Date   WBC 5.8 11/12/2023   HGB 9.9 (L) 11/12/2023   HCT 31.4 (L) 11/12/2023   MCV 96.9 11/12/2023   PLT 203 11/12/2023   NEUTROABS 4.3 11/12/2023    CMP  Lab Results  Component Value Date   NA 137 11/12/2023   K 4.3 11/12/2023   CL 108 11/12/2023   CO2 16 (L) 11/12/2023   GLUCOSE 126 (H) 11/12/2023   BUN 37 (H) 11/12/2023   CREATININE 2.68 (H) 11/12/2023   CALCIUM  8.5 (L) 11/12/2023   PROT 6.6 11/12/2023   ALBUMIN  3.3 (L) 11/12/2023   AST 19 11/12/2023   ALT 10 11/12/2023   ALKPHOS 145 (H) 11/12/2023   BILITOT 0.4 11/12/2023   GFRNONAA 24 (L) 11/12/2023   GFRAA 28 (L) 09/20/2019    Lab Results  Component Value Date   CEA1 2.61 01/02/2021   CEA 7.96 (H) 11/12/2023     Medications: I have reviewed the patient's current medications.   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), isolated mesenteric implant-resected, multiple tumor deposits Sigmoid/descending resection and creation of a descending colostomy 04/10/2016 MSI-stable, no  loss of mismatch repair protein expression Foundation 1- BRAF V600E positive, MS-stable, intermediate tumor mutation burden, no RAS mutation CT abdomen/pelvis 04/02/2016-no evidence of distant metastatic disease Cycle 1 FOLFOX 05/21/2016 Cycle 2 FOLFOX 06/04/2016 Cycle 3 FOLFOX 06/18/2016 Cycle 4 FOLFOX 07/02/2016 (oxaliplatin  held secondary to thrombocytopenia) Cycle 5 FOLFOX 07/16/2016 Cycle 6 FOLFOX 07/30/2016 Cycle 7 FOLFOX 08/13/2016 (oxaliplatin  held and 5-FU dose reduced) Cycle 8 FOLFOX 09/03/2016 (oxaliplatin  held secondary to neuropathy) Cycle 9 FOLFOX 09/17/2016 (oxaliplatin  held secondary to neuropathy) Cycle 10 FOLFOX 10/02/2016 Cycle 11 FOLFOX 10/15/2016 (oxaliplatin  eliminated from the regimen) Cycle 12 FOLFOX 10/30/2016 (oxaliplatin  eliminated) CTs 05/12/2017-no evidence of recurrent disease, right inguinal hernia containing bladder Colonoscopy 07/21/2017, 3 polyps were removed from the descending and transverse colon, fragments of tubular and tubulovillous adenoma CTs 05/19/2018-no evidence of recurrent disease, status post hernia repair, right upper lobe pneumonia CTs 05/20/2019-resolution of right upper lobe pneumonia, no evidence of metastatic disease Colonoscopy 01/25/2020-multiple polyps removed-tubular adenomas, poor preparation, repeat colonoscopy recommended CTs neck, chest, and abdomen/pelvis 08/27/2020- new 2 centimeter left axillary node, new 1.9 cm left inguinal node chronic mildly prominent portacaval node Ultrasound biopsy of left inguinal node on 10/29/2020-metastatic adenocarcinoma consistent with colon adenocarcinoma PET 11/12/2020-enlarged hypermetabolic left inguinal and left axillary nodes, solitary focus of hypermetabolic activity in the right lower quadrant small bowel Cycle 1 FOLFOX 12/19/2020 Cycle 2 FOLFOX 01/02/2021, oxaliplatin  dose reduced secondary to neutropenia and thrombocytopenia Cycle 3 FOLFOX 01/16/2021 Cycle 4 FOLFOX 01/30/2021  CTs 02/11/2021-no  change in left inguinal and left axillary nodes, no evidence of disease progression, stable wall thickening involving a loop of distal ileum Cycle 1 FOLFIRI/Avastin  02/27/2021 Cycle 2 FOLFIRI/Avastin  03/13/2021 Cycle 3 FOLFIRI/Avastin  04/03/2021 Cycle 4 FOLFIRI 04/17/2021, Avastin  held due to elevated urine protein 05/07/2021-24-hour urine protein elevated 1562 Cycle 5 FOLFIRI 05/08/2021, Avastin  held due to elevated urine protein, irinotecan  dose reduced due to in general not feeling well after treatment CTs 05/17/2021-left axillary and left inguinal lymph nodes are smaller, no evidence of disease progression Cycle 6 FOLFIRI 05/22/2021, Avastin  held due to proteinuria 06/03/2021 24-hour urine with 1.9 g of protein Cycle 7 FOLFIRI 06/12/2021, Avastin  held due to proteinuria Cycle 8 FOLFIRI 07/03/2021, Avastin  held due to proteinuria Cycle 9 FOLFIRI 07/24/2021, Avastin  held due to proteinuria Cycle 10 FOLFIRI 08/19/2021, Avastin  held due to proteinuria CTs 09/02/2021-no change in the left axillary, left inguinal, and portacaval lymph nodes.  No mention of the distal small bowel thickening Maintenance 5-fluorouracil  09/18/2021 CT abdomen/pelvis 10/28/2021-enlarged left inguinal lymph node, stable; stomach and small bowel grossly unremarkable. Maintenance 5-fluorouracil  continued Colonoscopy 12/10/2021-terminal ileum with an ulcerated partially obstructing medium-sized mass approximately 15 to 20 cm from the IC valve, invasive adenocarcinoma, colonic type, moderately differentiated (grade 2) PET scan 12/18/2021-progression of disease in the peritoneal space.  2 intensely hypermetabolic nodes in the left axilla, one node is new from the prior, the other is reduced in size and metabolic activity; decrease in size and metabolic activity of left inguinal adenopathy; intense metabolic activity associate with a loop of small bowel in the right lower quadrant, no interval change; new small hypermetabolic peritoneal implant  along the ventral peritoneal surface just right of midline; larger new peritoneal implant in the deep right pelvis; small new implant in the right mid abdomen Encorafenib /Panitumumab  01/08/2022 CTs 03/17/2022-decrease size of mesenteric implant at the posterior bladder wall, small mesenteric peritoneal lesions identified on PET/CT or not evident.  Decrease size of left inguinal left axillary nodes, hypermetabolic activity noted at the anterior left prostate on comparison August 2023 PET CTs 07/21/2022-decrease size of a left axillary lymph node and right pelvic implant.  Left inguinal lymph node measures smaller.  No new sites of disease identified.  Similar tiny pulmonary nodules. Encorafenib  and Panitumumab  continued, Panitumumab  changed from a 2-week schedule to a 3-week schedule 07/23/2022 CT 10/30/2022-stable left axillary lymph node, no new or progressive metastatic disease in the chest; stable left inguinal lymphadenopathy; decrease in size of deep right peritoneal implant; no new metastatic disease in the abdomen/pelvis. encorafenib  and Panitumumab  continued Panitumumab  dose reduced 11/25/2022 due to skin rash CTs 02/20/2023-increased left inguinal adenopathy, developing left external iliac and ileocolic mesenteric adenopathy, unchanged left axillary node, enlarging pulmonary nodules minimal enlargement of cul-de-sac nodule Encorafenib  and panitumumab  continued Panitumumab  every 2 weeks beginning 04/15/2023, continue encorafenib  Restaging CTs 05/03/2023-new low-density liver lesions.  Progression of pelvic and less so abdominal nodal metastasis.  Mild progression of pulmonary metastasis.  Left axillary adenopathy improved. Cycle 1 Lonsurf  05/19/2023, Avastin  every 2 weeks  Cycle 2 Lonsurf  06/23/2023, Avastin  every 2 weeks 07/03/2023-24-hour urine with 7 g proteinuria Cycle 3 Lonsurf  07/22/2023, Avastin  on hold due to proteinuria CTs 08/13/2023: Numerous bilateral pulmonary nodules (small, many were  present on past CT), unchanged liver metastases, enlargement of left inguinal lymph node mass and left iliac/pelvic sidewall lymph nodes Cycle 1 FOLFIRI/panitumumab /encorafenib  09/01/2023-he received Panitumumab , near completion of irinotecan /leucovorin  he decided to discontinue treatment, he did not receive 5-fluorouracil  09/14/2023 patient confirms decision to stop treatment, declines  hospice referral at this time 10/13/2023-hospice referral 11/04/2023-palliative radiation to left groin mass 2.   Chronic renal insufficiency   3.   Hypertension   4.  Inflamed sebaceous cyst at the upper back-status post incision and drainage   5.  Thrombocytopenia secondary chemotherapy-oxaliplatin  dose reduced beginning with cycle 3 FOLFOX, oxaliplatin  held with cycle 4 and cycle 7 FOLFOX   6.  Oxaliplatin  neuropathy-improved   7.  History of Mucositis secondary chemotherapy   8.  Symptoms of pneumonia January 2020-CT 05/19/2018 consistent with right upper lobe pneumonia, Levaquin  prescribed 05/20/2018   9.  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  Anemia, progressive 10/29/2021 2 units packed red blood cells 10/31/2021 Ferritin low 10/29/2021-oral iron Ferritin low 01/22/2022   12.  Colonoscopy 12/10/2021-terminal ileum with an ulcerated partially obstructing medium-sized mass approximately 15 to 20 cm from the IC valve, invasive adenocarcinoma, colonic type, moderately differentiated (grade 2) 13.  Admission 09/02/2022 with atrial flutter and rapid ventricular response 14.  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  History of atrial fibrillation/atrial flutter-apixaban  discontinued 08/04/2023 secondary to bleeding from the left groin mass      Disposition: Louis [name] has metastatic colon cancer.  He is completing a course of palliative radiation to a fungating left groin mass.  He will complete radiation 11/18/2023.  He would like to enroll in hospice care at the  completion of radiation.  Louis [name] would like to continue follow-up at the Cancer center.  He will return for an office visit and Port-A-Cath flush in 6 weeks.  He would like to remain on a full CODE STATUS.  Arley Hof, MD  11/12/2023  2:03 PM

## 2023-11-13 ENCOUNTER — Ambulatory Visit
Admission: RE | Admit: 2023-11-13 | Discharge: 2023-11-13 | Disposition: A | Discharge status: Home / Self Care | Source: Ambulatory Visit | Attending: Radiation Oncology | Admitting: Radiation Oncology

## 2023-11-13 ENCOUNTER — Ambulatory Visit
Admission: RE | Admit: 2023-11-13 | Discharge: 2023-11-13 | Disposition: A | Discharge status: Home / Self Care | Source: Ambulatory Visit | Attending: Radiation Oncology

## 2023-11-13 ENCOUNTER — Other Ambulatory Visit: Payer: Self-pay

## 2023-11-13 DIAGNOSIS — C187 Malignant neoplasm of sigmoid colon: Secondary | ICD-10-CM | POA: Diagnosis not present

## 2023-11-13 DIAGNOSIS — C7982 Secondary malignant neoplasm of genital organs: Secondary | ICD-10-CM | POA: Diagnosis not present

## 2023-11-13 DIAGNOSIS — Z510 Encounter for antineoplastic radiation therapy: Secondary | ICD-10-CM | POA: Diagnosis not present

## 2023-11-13 DIAGNOSIS — C774 Secondary and unspecified malignant neoplasm of inguinal and lower limb lymph nodes: Secondary | ICD-10-CM | POA: Diagnosis not present

## 2023-11-13 LAB — RAD ONC ARIA SESSION SUMMARY
Course Elapsed Days: 9
Plan Fractions Treated to Date: 7
Plan Prescribed Dose Per Fraction: 3 Gy
Plan Total Fractions Prescribed: 10
Plan Total Prescribed Dose: 30 Gy
Reference Point Dosage Given to Date: 21 Gy
Reference Point Session Dosage Given: 3 Gy
Session Number: 7

## 2023-11-16 ENCOUNTER — Other Ambulatory Visit: Payer: Self-pay

## 2023-11-16 ENCOUNTER — Ambulatory Visit
Admission: RE | Admit: 2023-11-16 | Discharge: 2023-11-16 | Disposition: A | Discharge status: Home / Self Care | Source: Ambulatory Visit | Attending: Radiation Oncology

## 2023-11-16 DIAGNOSIS — C774 Secondary and unspecified malignant neoplasm of inguinal and lower limb lymph nodes: Secondary | ICD-10-CM | POA: Diagnosis not present

## 2023-11-16 DIAGNOSIS — C7982 Secondary malignant neoplasm of genital organs: Secondary | ICD-10-CM | POA: Diagnosis not present

## 2023-11-16 DIAGNOSIS — C187 Malignant neoplasm of sigmoid colon: Secondary | ICD-10-CM | POA: Diagnosis not present

## 2023-11-16 DIAGNOSIS — Z510 Encounter for antineoplastic radiation therapy: Secondary | ICD-10-CM | POA: Diagnosis not present

## 2023-11-16 LAB — RAD ONC ARIA SESSION SUMMARY
Course Elapsed Days: 12
Plan Fractions Treated to Date: 8
Plan Prescribed Dose Per Fraction: 3 Gy
Plan Total Fractions Prescribed: 10
Plan Total Prescribed Dose: 30 Gy
Reference Point Dosage Given to Date: 24 Gy
Reference Point Session Dosage Given: 3 Gy
Session Number: 8

## 2023-11-17 ENCOUNTER — Other Ambulatory Visit: Payer: Self-pay

## 2023-11-17 ENCOUNTER — Ambulatory Visit
Admission: RE | Admit: 2023-11-17 | Discharge: 2023-11-17 | Disposition: A | Discharge status: Home / Self Care | Source: Ambulatory Visit | Attending: Radiation Oncology

## 2023-11-17 DIAGNOSIS — C774 Secondary and unspecified malignant neoplasm of inguinal and lower limb lymph nodes: Secondary | ICD-10-CM | POA: Diagnosis not present

## 2023-11-17 DIAGNOSIS — C7982 Secondary malignant neoplasm of genital organs: Secondary | ICD-10-CM | POA: Diagnosis not present

## 2023-11-17 DIAGNOSIS — Z510 Encounter for antineoplastic radiation therapy: Secondary | ICD-10-CM | POA: Diagnosis not present

## 2023-11-17 DIAGNOSIS — C187 Malignant neoplasm of sigmoid colon: Secondary | ICD-10-CM | POA: Diagnosis not present

## 2023-11-17 LAB — RAD ONC ARIA SESSION SUMMARY
Course Elapsed Days: 13
Plan Fractions Treated to Date: 9
Plan Prescribed Dose Per Fraction: 3 Gy
Plan Total Fractions Prescribed: 10
Plan Total Prescribed Dose: 30 Gy
Reference Point Dosage Given to Date: 27 Gy
Reference Point Session Dosage Given: 3 Gy
Session Number: 9

## 2023-11-18 ENCOUNTER — Other Ambulatory Visit: Payer: Self-pay

## 2023-11-18 ENCOUNTER — Ambulatory Visit
Admission: RE | Admit: 2023-11-18 | Discharge: 2023-11-18 | Disposition: A | Discharge status: Home / Self Care | Source: Ambulatory Visit | Attending: Radiation Oncology

## 2023-11-18 ENCOUNTER — Encounter: Payer: Self-pay | Admitting: Oncology

## 2023-11-18 ENCOUNTER — Other Ambulatory Visit (HOSPITAL_BASED_OUTPATIENT_CLINIC_OR_DEPARTMENT_OTHER): Payer: Self-pay

## 2023-11-18 DIAGNOSIS — C774 Secondary and unspecified malignant neoplasm of inguinal and lower limb lymph nodes: Secondary | ICD-10-CM | POA: Diagnosis not present

## 2023-11-18 DIAGNOSIS — Z510 Encounter for antineoplastic radiation therapy: Secondary | ICD-10-CM | POA: Diagnosis not present

## 2023-11-18 DIAGNOSIS — C187 Malignant neoplasm of sigmoid colon: Secondary | ICD-10-CM | POA: Diagnosis not present

## 2023-11-18 DIAGNOSIS — C7982 Secondary malignant neoplasm of genital organs: Secondary | ICD-10-CM | POA: Diagnosis not present

## 2023-11-18 LAB — RAD ONC ARIA SESSION SUMMARY
Course Elapsed Days: 14
Plan Fractions Treated to Date: 10
Plan Prescribed Dose Per Fraction: 3 Gy
Plan Total Fractions Prescribed: 10
Plan Total Prescribed Dose: 30 Gy
Reference Point Dosage Given to Date: 30 Gy
Reference Point Session Dosage Given: 3 Gy
Session Number: 10

## 2023-11-19 ENCOUNTER — Other Ambulatory Visit (HOSPITAL_BASED_OUTPATIENT_CLINIC_OR_DEPARTMENT_OTHER): Payer: Self-pay

## 2023-11-19 ENCOUNTER — Other Ambulatory Visit: Payer: Self-pay

## 2023-11-19 NOTE — Radiation Completion Notes (Addendum)
  Radiation Oncology         [phone]) [phone] ________________________________  Name: Louis [name] Louis [name] [name]. MRN: [id number]  Date of Service: 11/18/2023  DOB: [birth year]  End of Treatment Note   Diagnosis: Recurrent metastatic adenocarcinoma of the sigmoid colon involving multiple nodal stations the liver and the lungs with fungating and bleeding painful left inguinal nodal mass   Intent: Palliative     ==========DELIVERED PLANS==========  First Treatment Date: 2023-11-04 Last Treatment Date: 2023-11-18   Plan Name: Pelvis_L Site: Superficial Inguinal Nodes Technique: 3D Mode: Photon Dose Per Fraction: 3 Gy Prescribed Dose (Delivered / Prescribed): 30 Gy / 30 Gy Prescribed Fxs (Delivered / Prescribed): 10 / 10     ==========ON TREATMENT VISIT DATES========== 2023-11-05, 2023-11-13    See weekly On Treatment Notes in Epic for details in the Media tab (listed as Progress notes on the On Treatment Visit Dates listed above). The patient tolerated radiation. He developed fatigue and gastrointestinal symptoms of nausea and diarrhea which were treated symptomatically.  He will follow up with Dr. Cloretta and if he enrolls in hospice after that discussion we will be available as needed, if not, we would contact him in one month for post treatment evaluation.    Donald KYM Husband, PAC

## 2023-12-04 DIAGNOSIS — Z85038 Personal history of other malignant neoplasm of large intestine: Secondary | ICD-10-CM | POA: Diagnosis not present

## 2023-12-04 DIAGNOSIS — C778 Secondary and unspecified malignant neoplasm of lymph nodes of multiple regions: Secondary | ICD-10-CM | POA: Diagnosis not present

## 2023-12-08 ENCOUNTER — Other Ambulatory Visit: Payer: Self-pay

## 2023-12-08 ENCOUNTER — Encounter: Payer: Self-pay | Admitting: Nurse Practitioner

## 2023-12-08 ENCOUNTER — Ambulatory Visit (HOSPITAL_BASED_OUTPATIENT_CLINIC_OR_DEPARTMENT_OTHER)
Admission: RE | Admit: 2023-12-08 | Discharge: 2023-12-08 | Disposition: A | Discharge status: Home / Self Care | Source: Ambulatory Visit | Attending: Nurse Practitioner | Admitting: Nurse Practitioner

## 2023-12-08 ENCOUNTER — Telehealth: Payer: Self-pay

## 2023-12-08 ENCOUNTER — Inpatient Hospital Stay: Attending: Oncology | Admitting: Nurse Practitioner

## 2023-12-08 ENCOUNTER — Telehealth: Payer: Self-pay | Admitting: Oncology

## 2023-12-08 VITALS — BP 142/83 | HR 74 | Temp 98.7°F | Resp 18 | Ht 76.0 in | Wt 202.0 lb

## 2023-12-08 DIAGNOSIS — I4891 Unspecified atrial fibrillation: Secondary | ICD-10-CM | POA: Diagnosis not present

## 2023-12-08 DIAGNOSIS — D123 Benign neoplasm of transverse colon: Secondary | ICD-10-CM | POA: Insufficient documentation

## 2023-12-08 DIAGNOSIS — I129 Hypertensive chronic kidney disease with stage 1 through stage 4 chronic kidney disease, or unspecified chronic kidney disease: Secondary | ICD-10-CM | POA: Diagnosis not present

## 2023-12-08 DIAGNOSIS — C7801 Secondary malignant neoplasm of right lung: Secondary | ICD-10-CM | POA: Insufficient documentation

## 2023-12-08 DIAGNOSIS — C7800 Secondary malignant neoplasm of unspecified lung: Secondary | ICD-10-CM | POA: Diagnosis not present

## 2023-12-08 DIAGNOSIS — G620 Drug-induced polyneuropathy: Secondary | ICD-10-CM | POA: Diagnosis not present

## 2023-12-08 DIAGNOSIS — N189 Chronic kidney disease, unspecified: Secondary | ICD-10-CM | POA: Insufficient documentation

## 2023-12-08 DIAGNOSIS — R0600 Dyspnea, unspecified: Secondary | ICD-10-CM | POA: Diagnosis not present

## 2023-12-08 DIAGNOSIS — Z933 Colostomy status: Secondary | ICD-10-CM | POA: Diagnosis not present

## 2023-12-08 DIAGNOSIS — D649 Anemia, unspecified: Secondary | ICD-10-CM | POA: Insufficient documentation

## 2023-12-08 DIAGNOSIS — Z79899 Other long term (current) drug therapy: Secondary | ICD-10-CM | POA: Insufficient documentation

## 2023-12-08 DIAGNOSIS — C7802 Secondary malignant neoplasm of left lung: Secondary | ICD-10-CM | POA: Diagnosis not present

## 2023-12-08 DIAGNOSIS — L723 Sebaceous cyst: Secondary | ICD-10-CM | POA: Insufficient documentation

## 2023-12-08 DIAGNOSIS — C187 Malignant neoplasm of sigmoid colon: Secondary | ICD-10-CM | POA: Diagnosis not present

## 2023-12-08 DIAGNOSIS — Z7901 Long term (current) use of anticoagulants: Secondary | ICD-10-CM | POA: Insufficient documentation

## 2023-12-08 DIAGNOSIS — D6959 Other secondary thrombocytopenia: Secondary | ICD-10-CM | POA: Insufficient documentation

## 2023-12-08 DIAGNOSIS — C787 Secondary malignant neoplasm of liver and intrahepatic bile duct: Secondary | ICD-10-CM | POA: Diagnosis not present

## 2023-12-08 DIAGNOSIS — C801 Malignant (primary) neoplasm, unspecified: Secondary | ICD-10-CM | POA: Diagnosis not present

## 2023-12-08 LAB — CMP (CANCER CENTER ONLY)
ALT: 8 U/L (ref 0–44)
AST: 24 U/L (ref 15–41)
Albumin: 3.3 g/dL — ABNORMAL LOW (ref 3.5–5.0)
Alkaline Phosphatase: 160 U/L — ABNORMAL HIGH (ref 38–126)
Anion gap: 14 (ref 5–15)
BUN: 35 mg/dL — ABNORMAL HIGH (ref 8–23)
CO2: 16 mmol/L — ABNORMAL LOW (ref 22–32)
Calcium: 8.5 mg/dL — ABNORMAL LOW (ref 8.9–10.3)
Chloride: 105 mmol/L (ref 98–111)
Creatinine: 2.53 mg/dL — ABNORMAL HIGH (ref 0.61–1.24)
GFR, Estimated: 25 mL/min — ABNORMAL LOW (ref >60)
Glucose, Bld: 108 mg/dL — ABNORMAL HIGH (ref 70–99)
Potassium: 4.1 mmol/L (ref 3.5–5.1)
Sodium: 135 mmol/L (ref 135–145)
Total Bilirubin: 0.2 mg/dL (ref 0.0–1.2)
Total Protein: 7 g/dL (ref 6.5–8.1)

## 2023-12-08 LAB — CBC WITH DIFFERENTIAL (CANCER CENTER ONLY)
Abs Immature Granulocytes: 0.03 K/uL (ref 0.00–0.07)
Basophils Absolute: 0 K/uL (ref 0.0–0.1)
Basophils Relative: 1 %
Eosinophils Absolute: 0.2 K/uL (ref 0.0–0.5)
Eosinophils Relative: 4 %
HCT: 33.4 % — ABNORMAL LOW (ref 39.0–52.0)
Hemoglobin: 10.2 g/dL — ABNORMAL LOW (ref 13.0–17.0)
Immature Granulocytes: 1 %
Lymphocytes Relative: 17 %
Lymphs Abs: 1 K/uL (ref 0.7–4.0)
MCH: 29.1 pg (ref 26.0–34.0)
MCHC: 30.5 g/dL (ref 30.0–36.0)
MCV: 95.4 fL (ref 80.0–100.0)
Monocytes Absolute: 0.7 K/uL (ref 0.1–1.0)
Monocytes Relative: 13 %
Neutro Abs: 3.6 K/uL (ref 1.7–7.7)
Neutrophils Relative %: 64 %
Platelet Count: 198 K/uL (ref 150–400)
RBC: 3.5 MIL/uL — ABNORMAL LOW (ref 4.22–5.81)
RDW: 16.3 % — ABNORMAL HIGH (ref 11.5–15.5)
WBC Count: 5.5 K/uL (ref 4.0–10.5)
nRBC: 0 % (ref 0.0–0.2)

## 2023-12-08 LAB — SAMPLE TO BLOOD BANK

## 2023-12-08 NOTE — Progress Notes (Signed)
 Louis [name] OFFICE PROGRESS NOTE   Diagnosis: Colon cancer  INTERVAL HISTORY:   Louis [name] returns prior to scheduled follow-up.  He contacted the office earlier today requesting an appointment to evaluate dyspnea and wheezing.  He report slow progression of dyspnea on exertion over the past few weeks.  Maybe some wheezing the past few days.  He coughs a few times at nighttime.  The cough is nonproductive.  No chest pain.  No leg swelling or calf pain.  He thinks the left groin mass is smaller.  Appetite varies.  He denies pain.  No bleeding.  Objective:  Vital signs in last 24 hours:  Blood pressure (!) 142/83, pulse 74, temperature 98.7 F (37.1 C), temperature source Temporal, resp. rate 18, height 6' 4 (1.93 m), weight 202 lb (91.6 kg), SpO2 100%.    HEENT: No thrush or ulcers. Lymphatics: 1 to 2 cm left axillary node.  Large fungating left groin mass. Resp: Lungs clear bilaterally.  No wheezes. Cardio: Regular rate and rhythm. GI: No hepatosplenomegaly. Vascular: No leg edema. Port-A-Cath without erythema.  Lab Results:  Lab Results  Component Value Date   WBC 5.8 11/12/2023   HGB 9.9 (L) 11/12/2023   HCT 31.4 (L) 11/12/2023   MCV 96.9 11/12/2023   PLT 203 11/12/2023   NEUTROABS 4.3 11/12/2023    Imaging:  DG Chest 2 View Result Date: 12/08/2023 CLINICAL DATA:  Dyspnea EXAM: CHEST - 2 VIEW COMPARISON:  10/22/2022 FINDINGS: Interval development numerous pulmonary nodules bilaterally most in keeping with widespread pulmonary metastatic disease. No pneumothorax or pleural effusion. Right internal jugular chest port tip seen within the superior vena cava, unchanged. Cardiac size within normal limits. Vascularity is. No acute bone. IMPRESSION: 1. Widespread pulmonary metastatic disease, new since chest radiograph of 10/22/2022 and significantly progressed since prior chest CT examination of 08/13/2023. Dedicated CT imaging of the chest, abdomen, and pelvis  would be helpful for restaging purposes. Electronically Signed   By: Dorethia Molt M.D.   On: 12/08/2023 15:48    Medications: I have reviewed the patient's current medications.  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), isolated mesenteric implant-resected, multiple tumor deposits Sigmoid/descending resection and creation of a descending colostomy 04/10/2016 MSI-stable, no loss of mismatch repair protein expression Foundation 1- BRAF V600E positive, MS-stable, intermediate tumor mutation burden, no RAS mutation CT abdomen/pelvis 04/02/2016-no evidence of distant metastatic disease Cycle 1 FOLFOX 05/21/2016 Cycle 2 FOLFOX 06/04/2016 Cycle 3 FOLFOX 06/18/2016 Cycle 4 FOLFOX 07/02/2016 (oxaliplatin  held secondary to thrombocytopenia) Cycle 5 FOLFOX 07/16/2016 Cycle 6 FOLFOX 07/30/2016 Cycle 7 FOLFOX 08/13/2016 (oxaliplatin  held and 5-FU dose reduced) Cycle 8 FOLFOX 09/03/2016 (oxaliplatin  held secondary to neuropathy) Cycle 9 FOLFOX 09/17/2016 (oxaliplatin  held secondary to neuropathy) Cycle 10 FOLFOX 10/02/2016 Cycle 11 FOLFOX 10/15/2016 (oxaliplatin  eliminated from the regimen) Cycle 12 FOLFOX 10/30/2016 (oxaliplatin  eliminated) CTs 05/12/2017-no evidence of recurrent disease, right inguinal hernia containing bladder Colonoscopy 07/21/2017, 3 polyps were removed from the descending and transverse colon, fragments of tubular and tubulovillous adenoma CTs 05/19/2018-no evidence of recurrent disease, status post hernia repair, right upper lobe pneumonia CTs 05/20/2019-resolution of right upper lobe pneumonia, no evidence of metastatic disease Colonoscopy 01/25/2020-multiple polyps removed-tubular adenomas, poor preparation, repeat colonoscopy recommended CTs neck, chest, and abdomen/pelvis 08/27/2020- new 2 centimeter left axillary node, new 1.9 cm left inguinal node chronic mildly prominent portacaval node Ultrasound biopsy of left inguinal node on 10/29/2020-metastatic  adenocarcinoma consistent with colon adenocarcinoma PET 11/12/2020-enlarged hypermetabolic left inguinal and left axillary nodes, solitary focus  of hypermetabolic activity in the right lower quadrant small bowel Cycle 1 FOLFOX 12/19/2020 Cycle 2 FOLFOX 01/02/2021, oxaliplatin  dose reduced secondary to neutropenia and thrombocytopenia Cycle 3 FOLFOX 01/16/2021 Cycle 4 FOLFOX 01/30/2021 CTs 02/11/2021-no change in left inguinal and left axillary nodes, no evidence of disease progression, stable wall thickening involving a loop of distal ileum Cycle 1 FOLFIRI/Avastin  02/27/2021 Cycle 2 FOLFIRI/Avastin  03/13/2021 Cycle 3 FOLFIRI/Avastin  04/03/2021 Cycle 4 FOLFIRI 04/17/2021, Avastin  held due to elevated urine protein 05/07/2021-24-hour urine protein elevated 1562 Cycle 5 FOLFIRI 05/08/2021, Avastin  held due to elevated urine protein, irinotecan  dose reduced due to in general not feeling well after treatment CTs 05/17/2021-left axillary and left inguinal lymph nodes are smaller, no evidence of disease progression Cycle 6 FOLFIRI 05/22/2021, Avastin  held due to proteinuria 06/03/2021 24-hour urine with 1.9 g of protein Cycle 7 FOLFIRI 06/12/2021, Avastin  held due to proteinuria Cycle 8 FOLFIRI 07/03/2021, Avastin  held due to proteinuria Cycle 9 FOLFIRI 07/24/2021, Avastin  held due to proteinuria Cycle 10 FOLFIRI 08/19/2021, Avastin  held due to proteinuria CTs 09/02/2021-no change in the left axillary, left inguinal, and portacaval lymph nodes.  No mention of the distal small bowel thickening Maintenance 5-fluorouracil  09/18/2021 CT abdomen/pelvis 10/28/2021-enlarged left inguinal lymph node, stable; stomach and small bowel grossly unremarkable. Maintenance 5-fluorouracil  continued Colonoscopy 12/10/2021-terminal ileum with an ulcerated partially obstructing medium-sized mass approximately 15 to 20 cm from the IC valve, invasive adenocarcinoma, colonic type, moderately differentiated (grade 2) PET scan  12/18/2021-progression of disease in the peritoneal space.  2 intensely hypermetabolic nodes in the left axilla, one node is new from the prior, the other is reduced in size and metabolic activity; decrease in size and metabolic activity of left inguinal adenopathy; intense metabolic activity associate with a loop of small bowel in the right lower quadrant, no interval change; new small hypermetabolic peritoneal implant along the ventral peritoneal surface just right of midline; larger new peritoneal implant in the deep right pelvis; small new implant in the right mid abdomen Encorafenib /Panitumumab  01/08/2022 CTs 03/17/2022-decrease size of mesenteric implant at the posterior bladder wall, small mesenteric peritoneal lesions identified on PET/CT or not evident.  Decrease size of left inguinal left axillary nodes, hypermetabolic activity noted at the anterior left prostate on comparison August 2023 PET CTs 07/21/2022-decrease size of a left axillary lymph node and right pelvic implant.  Left inguinal lymph node measures smaller.  No new sites of disease identified.  Similar tiny pulmonary nodules. Encorafenib  and Panitumumab  continued, Panitumumab  changed from a 2-week schedule to a 3-week schedule 07/23/2022 CT 10/30/2022-stable left axillary lymph node, no new or progressive metastatic disease in the chest; stable left inguinal lymphadenopathy; decrease in size of deep right peritoneal implant; no new metastatic disease in the abdomen/pelvis. encorafenib  and Panitumumab  continued Panitumumab  dose reduced 11/25/2022 due to skin rash CTs 02/20/2023-increased left inguinal adenopathy, developing left external iliac and ileocolic mesenteric adenopathy, unchanged left axillary node, enlarging pulmonary nodules minimal enlargement of cul-de-sac nodule Encorafenib  and panitumumab  continued Panitumumab  every 2 weeks beginning 04/15/2023, continue encorafenib  Restaging CTs 05/03/2023-new low-density liver lesions.   Progression of pelvic and less so abdominal nodal metastasis.  Mild progression of pulmonary metastasis.  Left axillary adenopathy improved. Cycle 1 Lonsurf  05/19/2023, Avastin  every 2 weeks  Cycle 2 Lonsurf  06/23/2023, Avastin  every 2 weeks 07/03/2023-24-hour urine with 7 g proteinuria Cycle 3 Lonsurf  07/22/2023, Avastin  on hold due to proteinuria CTs 08/13/2023: Numerous bilateral pulmonary nodules (small, many were present on past CT), unchanged liver metastases, enlargement of left inguinal lymph node mass and  left iliac/pelvic sidewall lymph nodes Cycle 1 FOLFIRI/panitumumab /encorafenib  09/01/2023-he received Panitumumab , near completion of irinotecan /leucovorin  he decided to discontinue treatment, he did not receive 5-fluorouracil  09/14/2023 patient confirms decision to stop treatment, declines hospice referral at this time 10/13/2023-hospice referral 11/04/2023-palliative radiation to left groin mass 2.   Chronic renal insufficiency   3.   Hypertension   4.  Inflamed sebaceous cyst at the upper back-status post incision and drainage   5.  Thrombocytopenia secondary chemotherapy-oxaliplatin  dose reduced beginning with cycle 3 FOLFOX, oxaliplatin  held with cycle 4 and cycle 7 FOLFOX   6.  Oxaliplatin  neuropathy-improved   7.  History of Mucositis secondary chemotherapy   8.  Symptoms of pneumonia January 2020-CT 05/19/2018 consistent with right upper lobe pneumonia, Levaquin  prescribed 05/20/2018   9.  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  Anemia, progressive 10/29/2021 2 units packed red blood cells 10/31/2021 Ferritin low 10/29/2021-oral iron Ferritin low 01/22/2022   12.  Colonoscopy 12/10/2021-terminal ileum with an ulcerated partially obstructing medium-sized mass approximately 15 to 20 cm from the IC valve, invasive adenocarcinoma, colonic type, moderately differentiated (grade 2) 13.  Admission 09/02/2022 with atrial flutter and  rapid ventricular response 14.  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  History of atrial fibrillation/atrial flutter-apixaban  discontinued 08/04/2023 secondary to bleeding from the left groin mass  Disposition: Louis [name] has metastatic colon cancer.  He is followed on a supportive/comfort care approach.  He is enrolled in the home palliative program.  He presents today for evaluation of progressive dyspnea on exertion.  Chest x-ray shows widespread metastatic disease in the lungs.  We reviewed the x-ray results/image with Louis [name] and Louis [name].  They understand the high likelihood the dyspnea is related to cancer in Louis lungs.  We discussed potential supportive measures including home oxygen and steroids.  We also discussed transitioning to full hospice support.  He will consider what we talked about today and let us  know Louis decision when he returns for Louis next appointment in 2 weeks.  We discussed CODE STATUS.  He he confirms Louis decision for FULL CODE STATUS.  Patient seen with Dr. Cloretta.  Louis [name] ANP/GNP-BC   12/08/2023  4:04 PM  This was a shared visit with Louis [name].  Louis [name] was interviewed and examined.  He presents today with dyspnea.  A chest x-ray reveals extensive metastatic disease involving the lungs.  He appears to be symptomatic from progressive tumor spread in the lungs.  We reviewed the chest x-ray findings and images with Louis [name] and Louis [name].  I recommend full hospice care and a no CODE BLUE status.  He does not wish to enroll in hospice at present.  He will return for an office visit as scheduled on 12/23/2023.   I was present for greater than 50% of today's visit.  I performed medical decision making.  Louis Cloretta, MD

## 2023-12-08 NOTE — Telephone Encounter (Signed)
 The patient called early to cancel his appointment due to shortness of breath. I followed up with the patient, who reported experiencing wheezing and shortness of breath. He suspects he may have pneumonia. I advised him to go to the emergency room for evaluation; however, he declined. He agreed to come in to have a chest X-ray. The patient was instructed to wait in the cancer center lobby for his result.

## 2023-12-08 NOTE — Telephone Encounter (Signed)
 PT is short of breath and think he has pneumonia. Wants to be seen, and rescheduled next appt.

## 2023-12-09 ENCOUNTER — Inpatient Hospital Stay: Admitting: Nurse Practitioner

## 2023-12-16 ENCOUNTER — Encounter: Payer: Self-pay | Admitting: Oncology

## 2023-12-16 ENCOUNTER — Other Ambulatory Visit (HOSPITAL_BASED_OUTPATIENT_CLINIC_OR_DEPARTMENT_OTHER): Payer: Self-pay

## 2023-12-16 MED ORDER — LORAZEPAM 0.5 MG PO TABS
0.5000 mg | ORAL_TABLET | Freq: Three times a day (TID) | ORAL | 0 refills | Status: DC | PRN
  Filled 2023-12-16 (×2): qty 6, 2d supply, fill #0

## 2023-12-16 MED ORDER — HYOSCYAMINE SULFATE 0.125 MG PO TABS
0.1250 mg | ORAL_TABLET | ORAL | 0 refills | Status: AC | PRN
  Filled 2023-12-16 (×2): qty 5, 1d supply, fill #0

## 2023-12-16 MED ORDER — OXYCODONE HCL 5 MG PO TABS
5.0000 mg | ORAL_TABLET | ORAL | 0 refills | Status: DC | PRN
  Filled 2023-12-16: qty 15, 3d supply, fill #0

## 2023-12-17 ENCOUNTER — Other Ambulatory Visit (HOSPITAL_BASED_OUTPATIENT_CLINIC_OR_DEPARTMENT_OTHER): Payer: Self-pay

## 2023-12-17 ENCOUNTER — Encounter: Payer: Self-pay | Admitting: Oncology

## 2023-12-17 MED ORDER — LOPERAMIDE HCL 2 MG PO TABS
4.0000 mg | ORAL_TABLET | ORAL | 0 refills | Status: AC
  Filled 2023-12-17: qty 24, 6d supply, fill #0

## 2023-12-21 ENCOUNTER — Encounter

## 2023-12-23 ENCOUNTER — Inpatient Hospital Stay

## 2023-12-23 ENCOUNTER — Inpatient Hospital Stay: Admitting: Oncology

## 2023-12-24 ENCOUNTER — Other Ambulatory Visit

## 2023-12-24 ENCOUNTER — Ambulatory Visit: Admitting: Oncology

## 2023-12-30 ENCOUNTER — Other Ambulatory Visit (HOSPITAL_BASED_OUTPATIENT_CLINIC_OR_DEPARTMENT_OTHER): Payer: Self-pay

## 2023-12-30 MED ORDER — AZITHROMYCIN 250 MG PO TABS
ORAL_TABLET | ORAL | 0 refills | Status: AC
  Filled 2023-12-30: qty 6, 5d supply, fill #0

## 2024-01-06 ENCOUNTER — Encounter: Payer: Self-pay | Admitting: Oncology

## 2024-01-06 ENCOUNTER — Other Ambulatory Visit (HOSPITAL_BASED_OUTPATIENT_CLINIC_OR_DEPARTMENT_OTHER): Payer: Self-pay

## 2024-01-06 MED ORDER — SENNA-DOCUSATE SODIUM 8.6-50 MG PO TABS
1.0000 | ORAL_TABLET | Freq: Two times a day (BID) | ORAL | 0 refills | Status: AC | PRN
  Filled 2024-01-06: qty 30, 4d supply, fill #0

## 2024-01-06 MED ORDER — DEXAMETHASONE 4 MG PO TABS
16.0000 mg | ORAL_TABLET | Freq: Every day | ORAL | 0 refills | Status: AC
  Filled 2024-01-06: qty 15, 3d supply, fill #0

## 2024-01-06 MED ORDER — OXYCODONE HCL 5 MG PO TABS
5.0000 mg | ORAL_TABLET | ORAL | 0 refills | Status: AC | PRN
  Filled 2024-01-06: qty 10, 2d supply, fill #0

## 2024-01-08 ENCOUNTER — Other Ambulatory Visit (HOSPITAL_COMMUNITY): Payer: Self-pay

## 2024-01-08 ENCOUNTER — Encounter: Payer: Self-pay | Admitting: Oncology

## 2024-01-08 ENCOUNTER — Other Ambulatory Visit (HOSPITAL_BASED_OUTPATIENT_CLINIC_OR_DEPARTMENT_OTHER): Payer: Self-pay

## 2024-01-08 MED ORDER — LORAZEPAM 0.5 MG PO TABS
0.5000 mg | ORAL_TABLET | Freq: Three times a day (TID) | ORAL | 0 refills | Status: AC | PRN
  Filled 2024-01-08: qty 15, 5d supply, fill #0

## 2024-01-08 MED ORDER — HYDROMORPHONE HCL 1 MG/ML PO LIQD
ORAL | 0 refills | Status: DC
Start: 2024-01-08 — End: 2024-01-09
  Filled 2024-01-08: qty 30, 3d supply, fill #0

## 2024-01-09 ENCOUNTER — Other Ambulatory Visit (HOSPITAL_BASED_OUTPATIENT_CLINIC_OR_DEPARTMENT_OTHER): Payer: Self-pay

## 2024-01-09 ENCOUNTER — Other Ambulatory Visit (HOSPITAL_COMMUNITY): Payer: Self-pay

## 2024-01-09 MED ORDER — HYDROMORPHONE HCL 1 MG/ML PO LIQD
ORAL | 0 refills | Status: AC
  Filled 2024-01-09: qty 473, 39d supply, fill #0
  Filled 2024-01-09 (×2): qty 250, 21d supply, fill #0

## 2024-01-09 MED ORDER — LORAZEPAM 0.5 MG PO TABS
ORAL_TABLET | ORAL | 0 refills | Status: AC
  Filled 2024-01-09: qty 15, 4d supply, fill #0

## 2024-02-03 DEATH — deceased
